# Patient Record
Sex: Female | Born: 1940
Health system: Southern US, Community
[De-identification: ages and names within clinical notes are randomized; demographics above are authoritative.]

## PROBLEM LIST (undated history)

## (undated) DIAGNOSIS — J189 Pneumonia, unspecified organism: Secondary | ICD-10-CM

## (undated) DIAGNOSIS — F319 Bipolar disorder, unspecified: Secondary | ICD-10-CM

## (undated) DIAGNOSIS — I10 Essential (primary) hypertension: Secondary | ICD-10-CM

## (undated) DIAGNOSIS — Z8739 Personal history of other diseases of the musculoskeletal system and connective tissue: Secondary | ICD-10-CM

## (undated) DIAGNOSIS — D573 Sickle-cell trait: Secondary | ICD-10-CM

## (undated) DIAGNOSIS — J45909 Unspecified asthma, uncomplicated: Secondary | ICD-10-CM

## (undated) DIAGNOSIS — T8859XA Other complications of anesthesia, initial encounter: Secondary | ICD-10-CM

## (undated) DIAGNOSIS — E039 Hypothyroidism, unspecified: Secondary | ICD-10-CM

## (undated) DIAGNOSIS — Z8719 Personal history of other diseases of the digestive system: Secondary | ICD-10-CM

## (undated) DIAGNOSIS — M797 Fibromyalgia: Secondary | ICD-10-CM

## (undated) DIAGNOSIS — I2510 Atherosclerotic heart disease of native coronary artery without angina pectoris: Secondary | ICD-10-CM

## (undated) DIAGNOSIS — G629 Polyneuropathy, unspecified: Secondary | ICD-10-CM

## (undated) DIAGNOSIS — E559 Vitamin D deficiency, unspecified: Secondary | ICD-10-CM

## (undated) DIAGNOSIS — M1990 Unspecified osteoarthritis, unspecified site: Secondary | ICD-10-CM

## (undated) DIAGNOSIS — S91339A Puncture wound without foreign body, unspecified foot, initial encounter: Secondary | ICD-10-CM

## (undated) DIAGNOSIS — F419 Anxiety disorder, unspecified: Secondary | ICD-10-CM

## (undated) DIAGNOSIS — I509 Heart failure, unspecified: Secondary | ICD-10-CM

## (undated) DIAGNOSIS — K219 Gastro-esophageal reflux disease without esophagitis: Secondary | ICD-10-CM

## (undated) DIAGNOSIS — I700 Atherosclerosis of aorta: Secondary | ICD-10-CM

## (undated) DIAGNOSIS — M329 Systemic lupus erythematosus, unspecified: Secondary | ICD-10-CM

## (undated) DIAGNOSIS — Z9981 Dependence on supplemental oxygen: Secondary | ICD-10-CM

## (undated) DIAGNOSIS — Z531 Procedure and treatment not carried out because of patient's decision for reasons of belief and group pressure: Secondary | ICD-10-CM

## (undated) DIAGNOSIS — N19 Unspecified kidney failure: Secondary | ICD-10-CM

## (undated) DIAGNOSIS — R569 Unspecified convulsions: Secondary | ICD-10-CM

## (undated) DIAGNOSIS — F32A Depression, unspecified: Secondary | ICD-10-CM

## (undated) DIAGNOSIS — IMO0001 Reserved for inherently not codable concepts without codable children: Secondary | ICD-10-CM

## (undated) DIAGNOSIS — R413 Other amnesia: Secondary | ICD-10-CM

## (undated) DIAGNOSIS — E119 Type 2 diabetes mellitus without complications: Secondary | ICD-10-CM

## (undated) DIAGNOSIS — R410 Disorientation, unspecified: Secondary | ICD-10-CM

## (undated) DIAGNOSIS — T4145XA Adverse effect of unspecified anesthetic, initial encounter: Secondary | ICD-10-CM

## (undated) DIAGNOSIS — L930 Discoid lupus erythematosus: Secondary | ICD-10-CM

## (undated) DIAGNOSIS — E785 Hyperlipidemia, unspecified: Secondary | ICD-10-CM

## (undated) DIAGNOSIS — F329 Major depressive disorder, single episode, unspecified: Secondary | ICD-10-CM

## (undated) DIAGNOSIS — G4733 Obstructive sleep apnea (adult) (pediatric): Secondary | ICD-10-CM

## (undated) HISTORY — PX: INCISION AND DRAINAGE ABSCESS: SHX5864

## (undated) HISTORY — DX: Obstructive sleep apnea (adult) (pediatric): G47.33

## (undated) HISTORY — PX: CARPAL TUNNEL RELEASE: SHX101

## (undated) HISTORY — PX: CHOLECYSTECTOMY OPEN: SUR202

## (undated) HISTORY — DX: Puncture wound without foreign body, unspecified foot, initial encounter: S91.339A

## (undated) HISTORY — PX: CATARACT EXTRACTION W/ INTRAOCULAR LENS  IMPLANT, BILATERAL: SHX1307

## (undated) HISTORY — DX: Unspecified convulsions: R56.9

## (undated) HISTORY — DX: Hyperlipidemia, unspecified: E78.5

## (undated) HISTORY — DX: Heart failure, unspecified: I50.9

## (undated) HISTORY — DX: Major depressive disorder, single episode, unspecified: F32.9

## (undated) HISTORY — PX: FRACTURE SURGERY: SHX138

## (undated) HISTORY — PX: TUBAL LIGATION: SHX77

## (undated) HISTORY — DX: Polyneuropathy, unspecified: G62.9

## (undated) HISTORY — DX: Depression, unspecified: F32.A

## (undated) HISTORY — DX: Unspecified kidney failure: N19

## (undated) HISTORY — DX: Vitamin D deficiency, unspecified: E55.9

## (undated) HISTORY — DX: Essential (primary) hypertension: I10

## (undated) HISTORY — DX: Other amnesia: R41.3

## (undated) HISTORY — PX: NASAL SINUS SURGERY: SHX719

## (undated) HISTORY — DX: Hypothyroidism, unspecified: E03.9

## (undated) HISTORY — PX: KNEE LIGAMENT RECONSTRUCTION: SHX1895

---

## 1998-02-12 ENCOUNTER — Ambulatory Visit (HOSPITAL_COMMUNITY): Admission: RE | Admit: 1998-02-12 | Discharge: 1998-02-12 | Payer: Self-pay | Admitting: Internal Medicine

## 1998-09-09 ENCOUNTER — Emergency Department (HOSPITAL_COMMUNITY): Admission: EM | Admit: 1998-09-09 | Discharge: 1998-09-09 | Payer: Self-pay | Admitting: Emergency Medicine

## 1998-09-09 ENCOUNTER — Encounter: Payer: Self-pay | Admitting: Emergency Medicine

## 1998-09-19 ENCOUNTER — Encounter: Admission: RE | Admit: 1998-09-19 | Discharge: 1998-10-21 | Payer: Self-pay | Admitting: Orthopedic Surgery

## 1998-12-08 ENCOUNTER — Emergency Department (HOSPITAL_COMMUNITY): Admission: EM | Admit: 1998-12-08 | Discharge: 1998-12-08 | Payer: Self-pay | Admitting: Emergency Medicine

## 1998-12-08 ENCOUNTER — Encounter: Payer: Self-pay | Admitting: Emergency Medicine

## 1999-02-10 ENCOUNTER — Ambulatory Visit (HOSPITAL_COMMUNITY): Admission: RE | Admit: 1999-02-10 | Discharge: 1999-02-10 | Payer: Self-pay | Admitting: Internal Medicine

## 1999-03-06 ENCOUNTER — Ambulatory Visit (HOSPITAL_COMMUNITY): Admission: RE | Admit: 1999-03-06 | Discharge: 1999-03-06 | Payer: Self-pay | Admitting: General Surgery

## 1999-03-06 ENCOUNTER — Encounter (HOSPITAL_BASED_OUTPATIENT_CLINIC_OR_DEPARTMENT_OTHER): Payer: Self-pay | Admitting: General Surgery

## 1999-03-16 ENCOUNTER — Encounter (HOSPITAL_BASED_OUTPATIENT_CLINIC_OR_DEPARTMENT_OTHER): Payer: Self-pay | Admitting: General Surgery

## 1999-03-16 ENCOUNTER — Ambulatory Visit (HOSPITAL_COMMUNITY): Admission: RE | Admit: 1999-03-16 | Discharge: 1999-03-16 | Payer: Self-pay | Admitting: General Surgery

## 1999-03-31 ENCOUNTER — Ambulatory Visit (HOSPITAL_COMMUNITY): Admission: RE | Admit: 1999-03-31 | Discharge: 1999-03-31 | Payer: Self-pay | Admitting: General Surgery

## 1999-03-31 ENCOUNTER — Encounter (HOSPITAL_BASED_OUTPATIENT_CLINIC_OR_DEPARTMENT_OTHER): Payer: Self-pay | Admitting: General Surgery

## 1999-03-31 ENCOUNTER — Encounter (INDEPENDENT_AMBULATORY_CARE_PROVIDER_SITE_OTHER): Payer: Self-pay

## 1999-06-18 ENCOUNTER — Encounter: Payer: Self-pay | Admitting: Emergency Medicine

## 1999-06-18 ENCOUNTER — Emergency Department (HOSPITAL_COMMUNITY): Admission: EM | Admit: 1999-06-18 | Discharge: 1999-06-18 | Payer: Self-pay | Admitting: Emergency Medicine

## 1999-08-03 ENCOUNTER — Encounter: Payer: Self-pay | Admitting: Internal Medicine

## 1999-08-03 ENCOUNTER — Encounter: Admission: RE | Admit: 1999-08-03 | Discharge: 1999-08-03 | Payer: Self-pay | Admitting: Internal Medicine

## 1999-09-28 ENCOUNTER — Encounter: Payer: Self-pay | Admitting: Internal Medicine

## 1999-09-28 ENCOUNTER — Encounter: Admission: RE | Admit: 1999-09-28 | Discharge: 1999-09-28 | Payer: Self-pay | Admitting: Internal Medicine

## 1999-11-19 ENCOUNTER — Ambulatory Visit: Admission: RE | Admit: 1999-11-19 | Discharge: 1999-11-19 | Payer: Self-pay | Admitting: Internal Medicine

## 1999-12-18 ENCOUNTER — Encounter: Payer: Self-pay | Admitting: Internal Medicine

## 1999-12-18 ENCOUNTER — Encounter: Admission: RE | Admit: 1999-12-18 | Discharge: 1999-12-18 | Payer: Self-pay | Admitting: Internal Medicine

## 2000-08-18 ENCOUNTER — Encounter: Admission: RE | Admit: 2000-08-18 | Discharge: 2000-08-18 | Payer: Self-pay | Admitting: Family Medicine

## 2000-08-18 ENCOUNTER — Encounter: Payer: Self-pay | Admitting: Family Medicine

## 2001-06-25 ENCOUNTER — Encounter: Payer: Self-pay | Admitting: Emergency Medicine

## 2001-06-25 ENCOUNTER — Emergency Department (HOSPITAL_COMMUNITY): Admission: EM | Admit: 2001-06-25 | Discharge: 2001-06-25 | Payer: Self-pay | Admitting: Emergency Medicine

## 2001-07-17 ENCOUNTER — Encounter: Payer: Self-pay | Admitting: Emergency Medicine

## 2001-07-18 ENCOUNTER — Inpatient Hospital Stay (HOSPITAL_COMMUNITY): Admission: EM | Admit: 2001-07-18 | Discharge: 2001-07-20 | Payer: Self-pay | Admitting: Emergency Medicine

## 2001-07-18 ENCOUNTER — Encounter: Payer: Self-pay | Admitting: Sports Medicine

## 2001-07-25 ENCOUNTER — Encounter: Admission: RE | Admit: 2001-07-25 | Discharge: 2001-07-25 | Payer: Self-pay | Admitting: Family Medicine

## 2001-09-25 ENCOUNTER — Encounter: Payer: Self-pay | Admitting: Gastroenterology

## 2001-09-25 ENCOUNTER — Ambulatory Visit (HOSPITAL_COMMUNITY): Admission: RE | Admit: 2001-09-25 | Discharge: 2001-09-25 | Payer: Self-pay | Admitting: Gastroenterology

## 2001-09-29 ENCOUNTER — Ambulatory Visit (HOSPITAL_COMMUNITY): Admission: RE | Admit: 2001-09-29 | Discharge: 2001-09-29 | Payer: Self-pay | Admitting: Gastroenterology

## 2001-10-12 ENCOUNTER — Ambulatory Visit (HOSPITAL_COMMUNITY): Admission: RE | Admit: 2001-10-12 | Discharge: 2001-10-12 | Payer: Self-pay | Admitting: General Surgery

## 2001-10-12 ENCOUNTER — Encounter (HOSPITAL_BASED_OUTPATIENT_CLINIC_OR_DEPARTMENT_OTHER): Payer: Self-pay | Admitting: General Surgery

## 2001-10-25 ENCOUNTER — Encounter: Payer: Self-pay | Admitting: Family Medicine

## 2001-10-25 ENCOUNTER — Encounter: Admission: RE | Admit: 2001-10-25 | Discharge: 2001-10-25 | Payer: Self-pay | Admitting: Family Medicine

## 2002-10-12 ENCOUNTER — Ambulatory Visit (HOSPITAL_COMMUNITY): Admission: RE | Admit: 2002-10-12 | Discharge: 2002-10-12 | Payer: Self-pay | Admitting: General Surgery

## 2002-10-15 ENCOUNTER — Ambulatory Visit (HOSPITAL_COMMUNITY): Admission: RE | Admit: 2002-10-15 | Discharge: 2002-10-15 | Payer: Self-pay | Admitting: General Surgery

## 2002-10-15 ENCOUNTER — Encounter (HOSPITAL_BASED_OUTPATIENT_CLINIC_OR_DEPARTMENT_OTHER): Payer: Self-pay | Admitting: General Surgery

## 2003-02-19 ENCOUNTER — Encounter: Payer: Self-pay | Admitting: Family Medicine

## 2003-02-19 ENCOUNTER — Encounter: Admission: RE | Admit: 2003-02-19 | Discharge: 2003-02-19 | Payer: Self-pay | Admitting: Family Medicine

## 2003-03-07 ENCOUNTER — Encounter: Admission: RE | Admit: 2003-03-07 | Discharge: 2003-04-08 | Payer: Self-pay | Admitting: Family Medicine

## 2003-05-12 ENCOUNTER — Encounter
Admission: RE | Admit: 2003-05-12 | Discharge: 2003-05-12 | Payer: Self-pay | Admitting: Physical Medicine and Rehabilitation

## 2003-08-02 ENCOUNTER — Ambulatory Visit (HOSPITAL_COMMUNITY): Admission: RE | Admit: 2003-08-02 | Discharge: 2003-08-02 | Payer: Self-pay | Admitting: Family Medicine

## 2003-08-05 ENCOUNTER — Encounter: Admission: RE | Admit: 2003-08-05 | Discharge: 2003-08-05 | Payer: Self-pay | Admitting: Family Medicine

## 2003-10-03 ENCOUNTER — Ambulatory Visit (HOSPITAL_COMMUNITY): Admission: RE | Admit: 2003-10-03 | Discharge: 2003-10-03 | Payer: Self-pay | Admitting: Gastroenterology

## 2003-11-19 ENCOUNTER — Inpatient Hospital Stay (HOSPITAL_COMMUNITY): Admission: EM | Admit: 2003-11-19 | Discharge: 2003-11-23 | Payer: Self-pay | Admitting: *Deleted

## 2004-03-18 ENCOUNTER — Emergency Department (HOSPITAL_COMMUNITY): Admission: EM | Admit: 2004-03-18 | Discharge: 2004-03-18 | Payer: Self-pay | Admitting: Emergency Medicine

## 2004-03-23 ENCOUNTER — Emergency Department (HOSPITAL_COMMUNITY): Admission: EM | Admit: 2004-03-23 | Discharge: 2004-03-23 | Payer: Self-pay | Admitting: *Deleted

## 2004-04-21 ENCOUNTER — Encounter: Admission: RE | Admit: 2004-04-21 | Discharge: 2004-05-07 | Payer: Self-pay | Admitting: Family Medicine

## 2004-05-08 ENCOUNTER — Emergency Department (HOSPITAL_COMMUNITY): Admission: EM | Admit: 2004-05-08 | Discharge: 2004-05-08 | Payer: Self-pay | Admitting: Emergency Medicine

## 2004-05-20 ENCOUNTER — Encounter: Admission: RE | Admit: 2004-05-20 | Discharge: 2004-05-20 | Payer: Self-pay | Admitting: Interventional Cardiology

## 2004-06-24 ENCOUNTER — Encounter: Admission: RE | Admit: 2004-06-24 | Discharge: 2004-06-24 | Payer: Self-pay | Admitting: Pulmonary Disease

## 2004-06-24 ENCOUNTER — Ambulatory Visit: Payer: Self-pay | Admitting: Pulmonary Disease

## 2004-07-14 ENCOUNTER — Ambulatory Visit (HOSPITAL_BASED_OUTPATIENT_CLINIC_OR_DEPARTMENT_OTHER): Admission: RE | Admit: 2004-07-14 | Discharge: 2004-07-14 | Payer: Self-pay | Admitting: Pulmonary Disease

## 2004-07-14 ENCOUNTER — Ambulatory Visit: Payer: Self-pay | Admitting: Pulmonary Disease

## 2004-07-31 ENCOUNTER — Ambulatory Visit: Payer: Self-pay | Admitting: Pulmonary Disease

## 2004-09-09 ENCOUNTER — Ambulatory Visit: Payer: Self-pay | Admitting: Pulmonary Disease

## 2004-10-28 ENCOUNTER — Encounter: Admission: RE | Admit: 2004-10-28 | Discharge: 2005-01-26 | Payer: Self-pay | Admitting: Family Medicine

## 2004-11-11 ENCOUNTER — Encounter
Admission: RE | Admit: 2004-11-11 | Discharge: 2004-11-11 | Payer: Self-pay | Admitting: Physical Medicine and Rehabilitation

## 2004-12-01 ENCOUNTER — Ambulatory Visit: Payer: Self-pay | Admitting: Gastroenterology

## 2004-12-09 ENCOUNTER — Other Ambulatory Visit: Admission: RE | Admit: 2004-12-09 | Discharge: 2004-12-09 | Payer: Self-pay | Admitting: Obstetrics and Gynecology

## 2005-01-15 ENCOUNTER — Encounter: Admission: RE | Admit: 2005-01-15 | Discharge: 2005-01-15 | Payer: Self-pay | Admitting: Obstetrics and Gynecology

## 2005-02-16 ENCOUNTER — Ambulatory Visit (HOSPITAL_COMMUNITY): Admission: RE | Admit: 2005-02-16 | Discharge: 2005-02-16 | Payer: Self-pay | Admitting: Endocrinology

## 2005-03-01 ENCOUNTER — Encounter (INDEPENDENT_AMBULATORY_CARE_PROVIDER_SITE_OTHER): Payer: Self-pay | Admitting: *Deleted

## 2005-03-01 ENCOUNTER — Other Ambulatory Visit: Admission: RE | Admit: 2005-03-01 | Discharge: 2005-03-01 | Payer: Self-pay | Admitting: Interventional Radiology

## 2005-03-01 ENCOUNTER — Encounter: Admission: RE | Admit: 2005-03-01 | Discharge: 2005-03-01 | Payer: Self-pay | Admitting: Endocrinology

## 2005-07-20 ENCOUNTER — Encounter: Admission: RE | Admit: 2005-07-20 | Discharge: 2005-07-20 | Payer: Self-pay | Admitting: Orthopedic Surgery

## 2005-07-22 ENCOUNTER — Ambulatory Visit (HOSPITAL_BASED_OUTPATIENT_CLINIC_OR_DEPARTMENT_OTHER): Admission: RE | Admit: 2005-07-22 | Discharge: 2005-07-22 | Payer: Self-pay | Admitting: Orthopedic Surgery

## 2005-08-17 ENCOUNTER — Inpatient Hospital Stay (HOSPITAL_COMMUNITY): Admission: EM | Admit: 2005-08-17 | Discharge: 2005-08-23 | Payer: Self-pay | Admitting: Emergency Medicine

## 2005-08-20 ENCOUNTER — Encounter (INDEPENDENT_AMBULATORY_CARE_PROVIDER_SITE_OTHER): Payer: Self-pay | Admitting: Cardiology

## 2005-09-16 ENCOUNTER — Ambulatory Visit: Payer: Self-pay | Admitting: Family Medicine

## 2005-10-07 ENCOUNTER — Ambulatory Visit: Payer: Self-pay | Admitting: Family Medicine

## 2005-10-21 ENCOUNTER — Ambulatory Visit: Payer: Self-pay | Admitting: Family Medicine

## 2005-12-23 ENCOUNTER — Ambulatory Visit: Payer: Self-pay | Admitting: Family Medicine

## 2005-12-31 ENCOUNTER — Ambulatory Visit: Payer: Self-pay | Admitting: Family Medicine

## 2006-01-10 ENCOUNTER — Emergency Department (HOSPITAL_COMMUNITY): Admission: EM | Admit: 2006-01-10 | Discharge: 2006-01-10 | Payer: Self-pay | Admitting: Emergency Medicine

## 2006-01-14 ENCOUNTER — Ambulatory Visit: Payer: Self-pay | Admitting: Family Medicine

## 2006-01-21 ENCOUNTER — Encounter: Admission: RE | Admit: 2006-01-21 | Discharge: 2006-01-21 | Payer: Self-pay | Admitting: Family Medicine

## 2006-02-04 ENCOUNTER — Ambulatory Visit: Payer: Self-pay | Admitting: Family Medicine

## 2006-02-22 ENCOUNTER — Ambulatory Visit (HOSPITAL_BASED_OUTPATIENT_CLINIC_OR_DEPARTMENT_OTHER): Admission: RE | Admit: 2006-02-22 | Discharge: 2006-02-22 | Payer: Self-pay | Admitting: Family Medicine

## 2006-02-26 ENCOUNTER — Ambulatory Visit: Payer: Self-pay | Admitting: Internal Medicine

## 2006-03-09 ENCOUNTER — Ambulatory Visit: Payer: Self-pay | Admitting: Family Medicine

## 2006-06-23 ENCOUNTER — Ambulatory Visit: Payer: Self-pay | Admitting: Family Medicine

## 2006-07-19 ENCOUNTER — Encounter: Admission: RE | Admit: 2006-07-19 | Discharge: 2006-08-15 | Payer: Self-pay | Admitting: Orthopaedic Surgery

## 2006-08-16 ENCOUNTER — Encounter: Admission: RE | Admit: 2006-08-16 | Discharge: 2006-11-14 | Payer: Self-pay | Admitting: Orthopaedic Surgery

## 2006-08-29 ENCOUNTER — Ambulatory Visit: Payer: Self-pay | Admitting: Family Medicine

## 2006-10-31 ENCOUNTER — Ambulatory Visit: Payer: Self-pay | Admitting: Family Medicine

## 2006-11-23 ENCOUNTER — Ambulatory Visit: Payer: Self-pay | Admitting: Family Medicine

## 2006-11-30 ENCOUNTER — Ambulatory Visit: Payer: Self-pay | Admitting: Family Medicine

## 2007-01-23 ENCOUNTER — Ambulatory Visit: Payer: Self-pay | Admitting: Family Medicine

## 2007-01-25 ENCOUNTER — Ambulatory Visit: Payer: Self-pay | Admitting: Family Medicine

## 2007-01-25 LAB — CONVERTED CEMR LAB
ALT: 11 units/L (ref 0–35)
AST: 16 units/L (ref 0–37)
Albumin: 3.8 g/dL (ref 3.5–5.2)
Alkaline Phosphatase: 117 units/L (ref 39–117)
BUN: 16 mg/dL (ref 6–23)
CO2: 23 meq/L (ref 19–32)
Calcium: 9.1 mg/dL (ref 8.4–10.5)
Chloride: 106 meq/L (ref 96–112)
Cholesterol: 112 mg/dL (ref 0–200)
Creatinine, Ser: 1.1 mg/dL (ref 0.40–1.20)
Glucose, Bld: 96 mg/dL (ref 70–99)
HDL: 40 mg/dL (ref >39)
LDL Cholesterol: 55 mg/dL (ref 0–99)
Potassium: 4.3 meq/L (ref 3.5–5.3)
Sodium: 140 meq/L (ref 135–145)
TSH: 0.909 microintl units/mL (ref 0.350–5.50)
Total Bilirubin: 0.4 mg/dL (ref 0.3–1.2)
Total CHOL/HDL Ratio: 2.8
Total Protein: 8.3 g/dL (ref 6.0–8.3)
Triglycerides: 86 mg/dL (ref <150)
VLDL: 17 mg/dL (ref 0–40)

## 2007-03-07 DIAGNOSIS — G4730 Sleep apnea, unspecified: Secondary | ICD-10-CM | POA: Insufficient documentation

## 2007-03-07 DIAGNOSIS — I2510 Atherosclerotic heart disease of native coronary artery without angina pectoris: Secondary | ICD-10-CM | POA: Insufficient documentation

## 2007-03-07 DIAGNOSIS — F32A Depression, unspecified: Secondary | ICD-10-CM | POA: Insufficient documentation

## 2007-03-07 DIAGNOSIS — F329 Major depressive disorder, single episode, unspecified: Secondary | ICD-10-CM

## 2007-03-07 DIAGNOSIS — E7800 Pure hypercholesterolemia, unspecified: Secondary | ICD-10-CM | POA: Insufficient documentation

## 2007-03-07 DIAGNOSIS — K219 Gastro-esophageal reflux disease without esophagitis: Secondary | ICD-10-CM | POA: Insufficient documentation

## 2007-03-07 DIAGNOSIS — E785 Hyperlipidemia, unspecified: Secondary | ICD-10-CM

## 2007-03-07 DIAGNOSIS — E119 Type 2 diabetes mellitus without complications: Secondary | ICD-10-CM | POA: Insufficient documentation

## 2007-03-07 DIAGNOSIS — I110 Hypertensive heart disease with heart failure: Secondary | ICD-10-CM | POA: Insufficient documentation

## 2007-03-07 DIAGNOSIS — E039 Hypothyroidism, unspecified: Secondary | ICD-10-CM | POA: Insufficient documentation

## 2007-03-11 DIAGNOSIS — M159 Polyosteoarthritis, unspecified: Secondary | ICD-10-CM | POA: Insufficient documentation

## 2007-03-11 DIAGNOSIS — Z862 Personal history of diseases of the blood and blood-forming organs and certain disorders involving the immune mechanism: Secondary | ICD-10-CM | POA: Insufficient documentation

## 2007-03-11 DIAGNOSIS — G2581 Restless legs syndrome: Secondary | ICD-10-CM | POA: Insufficient documentation

## 2007-03-11 DIAGNOSIS — D649 Anemia, unspecified: Secondary | ICD-10-CM | POA: Insufficient documentation

## 2007-03-11 DIAGNOSIS — Z8639 Personal history of other endocrine, nutritional and metabolic disease: Secondary | ICD-10-CM | POA: Insufficient documentation

## 2007-03-11 DIAGNOSIS — M329 Systemic lupus erythematosus, unspecified: Secondary | ICD-10-CM | POA: Insufficient documentation

## 2007-03-11 DIAGNOSIS — G3184 Mild cognitive impairment, so stated: Secondary | ICD-10-CM | POA: Insufficient documentation

## 2007-04-13 ENCOUNTER — Encounter (INDEPENDENT_AMBULATORY_CARE_PROVIDER_SITE_OTHER): Payer: Self-pay | Admitting: Nurse Practitioner

## 2007-05-09 ENCOUNTER — Encounter: Admission: RE | Admit: 2007-05-09 | Discharge: 2007-05-09 | Payer: Self-pay | Admitting: Family Medicine

## 2007-08-17 ENCOUNTER — Encounter (INDEPENDENT_AMBULATORY_CARE_PROVIDER_SITE_OTHER): Payer: Self-pay | Admitting: Internal Medicine

## 2007-12-08 ENCOUNTER — Telehealth (INDEPENDENT_AMBULATORY_CARE_PROVIDER_SITE_OTHER): Payer: Self-pay | Admitting: Family Medicine

## 2008-12-20 ENCOUNTER — Encounter: Payer: Self-pay | Admitting: Family Medicine

## 2009-01-15 ENCOUNTER — Ambulatory Visit: Payer: Self-pay | Admitting: Family Medicine

## 2009-01-15 LAB — CONVERTED CEMR LAB: Hgb A1c MFr Bld: 6.6 %

## 2009-01-16 LAB — CONVERTED CEMR LAB
ALT: 15 units/L (ref 0–35)
AST: 21 units/L (ref 0–37)
Albumin: 3.8 g/dL (ref 3.5–5.2)
Alkaline Phosphatase: 122 units/L — ABNORMAL HIGH (ref 39–117)
BUN: 18 mg/dL (ref 6–23)
CO2: 21 meq/L (ref 19–32)
Calcium: 8.6 mg/dL (ref 8.4–10.5)
Chloride: 107 meq/L (ref 96–112)
Creatinine, Ser: 1.28 mg/dL — ABNORMAL HIGH (ref 0.40–1.20)
Direct LDL: 60 mg/dL
Glucose, Bld: 118 mg/dL — ABNORMAL HIGH (ref 70–99)
HCT: 35.3 % — ABNORMAL LOW (ref 36.0–46.0)
Hemoglobin: 12.2 g/dL (ref 12.0–15.0)
MCHC: 34.6 g/dL (ref 30.0–36.0)
MCV: 69.6 fL — ABNORMAL LOW (ref 78.0–100.0)
Platelets: 236 10*3/uL (ref 150–400)
Potassium: 4.7 meq/L (ref 3.5–5.3)
RBC: 5.07 M/uL (ref 3.87–5.11)
RDW: 16.1 % — ABNORMAL HIGH (ref 11.5–15.5)
Sodium: 142 meq/L (ref 135–145)
TSH: 0.804 microintl units/mL (ref 0.350–4.500)
Total Bilirubin: 0.3 mg/dL (ref 0.3–1.2)
Total Protein: 8.3 g/dL (ref 6.0–8.3)
WBC: 9 10*3/uL (ref 4.0–10.5)

## 2009-01-20 ENCOUNTER — Telehealth: Payer: Self-pay | Admitting: Family Medicine

## 2009-01-28 ENCOUNTER — Encounter: Payer: Self-pay | Admitting: Family Medicine

## 2009-02-26 ENCOUNTER — Ambulatory Visit: Payer: Self-pay | Admitting: Family Medicine

## 2009-02-26 LAB — CONVERTED CEMR LAB
Bilirubin Urine: NEGATIVE
Blood in Urine, dipstick: NEGATIVE
Glucose, Urine, Semiquant: NEGATIVE
Ketones, urine, test strip: NEGATIVE
Nitrite: NEGATIVE
Protein, U semiquant: 100
Specific Gravity, Urine: >=1.03
Urobilinogen, UA: 0.2
WBC Urine, dipstick: NEGATIVE
pH: 5.5

## 2009-03-18 ENCOUNTER — Telehealth: Payer: Self-pay | Admitting: Family Medicine

## 2009-04-25 ENCOUNTER — Ambulatory Visit: Payer: Self-pay | Admitting: Family Medicine

## 2009-04-25 DIAGNOSIS — M1990 Unspecified osteoarthritis, unspecified site: Secondary | ICD-10-CM | POA: Insufficient documentation

## 2009-04-25 DIAGNOSIS — E1149 Type 2 diabetes mellitus with other diabetic neurological complication: Secondary | ICD-10-CM | POA: Insufficient documentation

## 2009-04-25 LAB — CONVERTED CEMR LAB: Hgb A1c MFr Bld: 6.4 %

## 2009-05-06 ENCOUNTER — Telehealth: Payer: Self-pay | Admitting: Family Medicine

## 2009-05-06 ENCOUNTER — Encounter: Payer: Self-pay | Admitting: Family Medicine

## 2009-05-07 ENCOUNTER — Encounter: Admission: RE | Admit: 2009-05-07 | Discharge: 2009-05-07 | Payer: Self-pay | Admitting: Family Medicine

## 2009-06-09 ENCOUNTER — Telehealth: Payer: Self-pay | Admitting: Family Medicine

## 2009-07-25 ENCOUNTER — Ambulatory Visit: Payer: Self-pay | Admitting: Family Medicine

## 2009-07-25 LAB — CONVERTED CEMR LAB: Hgb A1c MFr Bld: 6.7 %

## 2009-09-17 ENCOUNTER — Ambulatory Visit: Payer: Self-pay | Admitting: Family Medicine

## 2009-09-23 ENCOUNTER — Telehealth: Payer: Self-pay | Admitting: Family Medicine

## 2009-09-23 ENCOUNTER — Telehealth (INDEPENDENT_AMBULATORY_CARE_PROVIDER_SITE_OTHER): Payer: Self-pay | Admitting: Family Medicine

## 2009-10-02 ENCOUNTER — Telehealth: Payer: Self-pay | Admitting: Family Medicine

## 2009-10-03 ENCOUNTER — Encounter: Payer: Self-pay | Admitting: Family Medicine

## 2009-10-03 ENCOUNTER — Ambulatory Visit: Payer: Self-pay | Admitting: Family Medicine

## 2009-10-03 DIAGNOSIS — R259 Unspecified abnormal involuntary movements: Secondary | ICD-10-CM | POA: Insufficient documentation

## 2009-10-03 LAB — CONVERTED CEMR LAB
ALT: 14 units/L (ref 0–35)
AST: 20 units/L (ref 0–37)
Albumin: 3.9 g/dL (ref 3.5–5.2)
Alkaline Phosphatase: 113 units/L (ref 39–117)
BUN: 19 mg/dL (ref 6–23)
CO2: 26 meq/L (ref 19–32)
Calcium: 8.8 mg/dL (ref 8.4–10.5)
Chloride: 102 meq/L (ref 96–112)
Cholesterol: 117 mg/dL (ref 0–200)
Creatinine, Ser: 1.64 mg/dL — ABNORMAL HIGH (ref 0.40–1.20)
Glucose, Bld: 91 mg/dL (ref 70–99)
HCT: 36.5 % (ref 36.0–46.0)
HDL: 36 mg/dL — ABNORMAL LOW (ref >39)
Hemoglobin: 12 g/dL (ref 12.0–15.0)
Hgb A1c MFr Bld: 6.8 %
LDL Cholesterol: 69 mg/dL (ref 0–99)
MCHC: 32.9 g/dL (ref 30.0–36.0)
MCV: 71.9 fL — ABNORMAL LOW (ref 78.0–100.0)
Platelets: 256 10*3/uL (ref 150–400)
Potassium: 4.9 meq/L (ref 3.5–5.3)
RBC: 5.08 M/uL (ref 3.87–5.11)
RDW: 15.2 % (ref 11.5–15.5)
Sodium: 137 meq/L (ref 135–145)
TSH: 0.947 microintl units/mL (ref 0.350–4.500)
Total Bilirubin: 0.4 mg/dL (ref 0.3–1.2)
Total CHOL/HDL Ratio: 3.3
Total Protein: 8.3 g/dL (ref 6.0–8.3)
Triglycerides: 58 mg/dL (ref <150)
VLDL: 12 mg/dL (ref 0–40)
WBC: 12.2 10*3/uL — ABNORMAL HIGH (ref 4.0–10.5)

## 2009-10-06 ENCOUNTER — Encounter: Payer: Self-pay | Admitting: Family Medicine

## 2009-10-17 ENCOUNTER — Ambulatory Visit: Payer: Self-pay | Admitting: Family Medicine

## 2009-10-20 LAB — CONVERTED CEMR LAB: Ferritin: 132 ng/mL (ref 10–291)

## 2009-11-06 ENCOUNTER — Telehealth: Payer: Self-pay | Admitting: Family Medicine

## 2009-12-25 ENCOUNTER — Telehealth: Payer: Self-pay | Admitting: Family Medicine

## 2010-01-12 ENCOUNTER — Telehealth: Payer: Self-pay | Admitting: Family Medicine

## 2010-01-12 ENCOUNTER — Encounter: Payer: Self-pay | Admitting: *Deleted

## 2010-02-27 ENCOUNTER — Ambulatory Visit: Payer: Self-pay | Admitting: Family Medicine

## 2010-02-27 DIAGNOSIS — R2681 Unsteadiness on feet: Secondary | ICD-10-CM | POA: Insufficient documentation

## 2010-03-26 ENCOUNTER — Encounter: Payer: Self-pay | Admitting: Family Medicine

## 2010-03-26 DIAGNOSIS — J449 Chronic obstructive pulmonary disease, unspecified: Secondary | ICD-10-CM | POA: Insufficient documentation

## 2010-03-31 ENCOUNTER — Encounter: Payer: Self-pay | Admitting: Family Medicine

## 2010-03-31 DIAGNOSIS — I5042 Chronic combined systolic (congestive) and diastolic (congestive) heart failure: Secondary | ICD-10-CM

## 2010-03-31 DIAGNOSIS — I5022 Chronic systolic (congestive) heart failure: Secondary | ICD-10-CM | POA: Insufficient documentation

## 2010-04-27 ENCOUNTER — Encounter: Payer: Self-pay | Admitting: Family Medicine

## 2010-05-08 ENCOUNTER — Ambulatory Visit: Payer: Self-pay | Admitting: Family Medicine

## 2010-05-08 DIAGNOSIS — R21 Rash and other nonspecific skin eruption: Secondary | ICD-10-CM | POA: Insufficient documentation

## 2010-05-08 LAB — CONVERTED CEMR LAB: Hgb A1c MFr Bld: 7.4 %

## 2010-05-13 ENCOUNTER — Telehealth (INDEPENDENT_AMBULATORY_CARE_PROVIDER_SITE_OTHER): Payer: Self-pay | Admitting: Family Medicine

## 2010-05-13 DIAGNOSIS — N183 Chronic kidney disease, stage 3 unspecified: Secondary | ICD-10-CM | POA: Insufficient documentation

## 2010-05-20 ENCOUNTER — Telehealth: Payer: Self-pay | Admitting: *Deleted

## 2010-06-17 ENCOUNTER — Encounter
Admission: RE | Admit: 2010-06-17 | Discharge: 2010-06-17 | Payer: Self-pay | Source: Home / Self Care | Attending: Family Medicine | Admitting: Family Medicine

## 2010-07-25 ENCOUNTER — Encounter: Payer: Self-pay | Admitting: Gastroenterology

## 2010-07-26 ENCOUNTER — Encounter: Payer: Self-pay | Admitting: Endocrinology

## 2010-07-26 ENCOUNTER — Encounter: Payer: Self-pay | Admitting: Physical Medicine and Rehabilitation

## 2010-08-06 NOTE — Miscellaneous (Signed)
Summary: Asthma clarifcation   Clinical Lists Changes  Problems: Removed problem of HEEL PAIN (ICD-729.5) Removed problem of RELIGION AFFECTING CARE (ICD-V62.6) Changed problem from ASTHMA (ICD-493.90) to ASTHMA, PERSISTENT, MODERATE (ICD-493.90)

## 2010-08-06 NOTE — Progress Notes (Signed)
   Phone Note Call from Patient   Caller: Patient Call For: [phone] Summary of Call: Ms. Reidinger is requesting change of walker.  She want the type that has a seat and a basket.  Need physician to send new rx Initial call taken by: Eusebio Friendly,  May 20, 2010 2:35 PM  Follow-up for Phone Call        Handwritten Rx given to nurse to follow up from there Follow-up by: Madison Hickman MD,  May 21, 2010 1:46 PM  Additional Follow-up for Phone Call Additional follow up Details #1::        wheelchair request faxed to [facility] Additional Follow-up by: Audelia Hives CMA,  May 21, 2010 6:03 PM

## 2010-08-06 NOTE — Miscellaneous (Signed)
Summary: Old record information  Received old records from Dr. Laurance Flatten.  Incorporated key facts from those records. Brittany Hickman MD  January 28, 2009 1:51 PM  Clinical Lists Changes  Allergies: Added new allergy or adverse reaction of LIPITOR (ATORVASTATIN) Observations: Added new observation of ALLERGY REV: Done (01/28/2009 13:49) Added new observation of MAMMOGRAM: No specific mammographic evidence of malignancy.  Assessment: BIRADS 1.  (05/09/2007 14:18) Added new observation of EKG INTERP: normal:   (07/09/2005 14:19) Added new observation of CXR RESULTS: normal:   (07/09/2005 14:19) Added new observation of COLONOSCOPY: Results: Normal.  (07/05/2002 14:17)      Prevention & Chronic Care Immunizations   Influenza vaccine: Not documented    Tetanus booster: 08/05/2005: Historical    Pneumococcal vaccine: Historical  (08/05/2000)    H. zoster vaccine: Not documented  Colorectal Screening   Hemoccult: Not documented    Colonoscopy: Results: Normal.   (07/05/2002)  Other Screening   Pap smear: Not documented    Mammogram: No specific mammographic evidence of malignancy.  Assessment: BIRADS 1.   (05/09/2007)    DXA bone density scan: Not documented   Smoking status: Not documented  Diabetes Mellitus   HgbA1C: 6.6  (01/15/2009)    Eye exam: Not documented    Foot exam: Not documented   High risk foot: Not documented   Foot care education: Not documented    Urine microalbumin/creatinine ratio: Not documented  Lipids   Total Cholesterol: 112  (01/25/2007)   LDL: 55  (01/25/2007)   LDL Direct: 60  (01/15/2009)   HDL: 40  (01/25/2007)   Triglycerides: 86  (01/25/2007)    SGOT (AST): 21  (01/15/2009)   SGPT (ALT): 15  (01/15/2009)   Alkaline phosphatase: 122  (01/15/2009)   Total bilirubin: 0.3  (01/15/2009)  Hypertension   Last Blood Pressure: 135 / 71  (01/15/2009)   Serum creatinine: 1.28  (01/15/2009)   Serum potassium 4.7  (01/15/2009)   Self-Management Support :    Diabetes self-management support: Not documented    Hypertension self-management support: Not documented    Lipid self-management support: Not documented     Allergies: 1)  ! Pcn 2)  ! * Requip 3)  ! * Lyrica 4)  ! Lipitor (Atorvastatin)       Colonoscopy  Procedure date:  07/05/2002  Findings:      Results: Normal.   Mammogram  Procedure date:  05/09/2007  Findings:      No specific mammographic evidence of malignancy.  Assessment: BIRADS 1.   CXR  Procedure date:  07/09/2005  Findings:      normal:    EKG  Procedure date:  07/09/2005  Findings:      normal:     Colonoscopy  Procedure date:  07/05/2002  Findings:      Results: Normal.   Mammogram  Procedure date:  05/09/2007  Findings:      No specific mammographic evidence of malignancy.  Assessment: BIRADS 1.   CXR  Procedure date:  07/09/2005  Findings:      normal:    EKG  Procedure date:  07/09/2005  Findings:      normal:      Appended Document: Old record information     Clinical Lists Changes  Observations: Added new observation of PAPRECACT: Not indicated-other (01/28/2009 14:20)       Prevention & Chronic Care Immunizations   Influenza vaccine: Not documented    Tetanus booster: 08/05/2005: Historical    Pneumococcal vaccine: Historical  (  08/05/2000)    H. zoster vaccine: Not documented  Colorectal Screening   Hemoccult: Not documented    Colonoscopy: Results: Normal.   (07/05/2002)  Other Screening   Pap smear: Not documented   Pap smear action/deferral: Not indicated-other  (01/28/2009)    Mammogram: No specific mammographic evidence of malignancy.  Assessment: BIRADS 1.   (05/09/2007)    DXA bone density scan: Not documented   Smoking status: Not documented  Diabetes Mellitus   HgbA1C: 6.6  (01/15/2009)    Eye exam: Not documented    Foot exam: Not documented   High risk foot: Not  documented   Foot care education: Not documented    Urine microalbumin/creatinine ratio: Not documented  Lipids   Total Cholesterol: 112  (01/25/2007)   LDL: 55  (01/25/2007)   LDL Direct: 60  (01/15/2009)   HDL: 40  (01/25/2007)   Triglycerides: 86  (01/25/2007)    SGOT (AST): 21  (01/15/2009)   SGPT (ALT): 15  (01/15/2009)   Alkaline phosphatase: 122  (01/15/2009)   Total bilirubin: 0.3  (01/15/2009)  Hypertension   Last Blood Pressure: 135 / 71  (01/15/2009)   Serum creatinine: 1.28  (01/15/2009)   Serum potassium 4.7  (01/15/2009)  Self-Management Support :    Diabetes self-management support: Not documented    Hypertension self-management support: Not documented    Lipid self-management support: Not documented

## 2010-08-06 NOTE — Medication Information (Signed)
Summary: RX Folder/DISCONTINUE REQUESTS  RX Folder/DISCONTINUE REQUESTS   Imported By: Roland Earl 08/25/2007 11:07:03  _____________________________________________________________________  External Attachment:    Type:   Image     Comment:   External Document

## 2010-08-06 NOTE — Assessment & Plan Note (Signed)
Summary: new pt/AC   Vital Signs:  Patient profile:   70 year old female Height:      63 inches Weight:      285.7 pounds BMI:     50.79 Temp:     98.7 degrees F Pulse rate:   86 / minute BP sitting:   135 / 71  (left arm)  Vitals Entered By: Janeth Rase LPN (July 14, 624THL 075-GRM AM) CC: New patient. Is Patient Diabetic? Yes  Pain Assessment Patient in pain? no        CC:  New patient..  History of Present Illness: First visit to me.  Needs refills on one med.  Brought in all meds.  Unclear where she stands with health promotion disease prevention strategies  Has some buttocks soreness on sitting.    Also has some nasal congestion which is controled by her meds.  I note on her problem list that mild cognitive impairment is listed and this fits with my impression of her during the interview  Current Medications (verified): 1)  Metoclopramide Hcl 10 Mg  Tabs (Metoclopramide Hcl) .... Take 1 Tablet By Mouth Three Times A Day 2)  Nexium 40 Mg  Cpdr (Esomeprazole Magnesium) .... Take 1 Tablet By Mouth Once A Day 3)  Synthroid 75 Mcg  Tabs (Levothyroxine Sodium) .... Take 1 Tablet By Mouth Once A Day 4)  Fexofenadine Hcl 180 Mg  Tabs (Fexofenadine Hcl) .... Take 1 Tablet By Mouth Once A Day 5)  Imdur 60 Mg  Tb24 (Isosorbide Mononitrate) .... Take 1 Tablet By Mouth Once A Day 6)  Lexapro 20 Mg  Tabs (Escitalopram Oxalate) .... Take 1 Tablet By Mouth Once A Day 7)  Aspirin Ec 325 Mg  Tbec (Aspirin) .... Take 1 Tablet By Mouth Once A Day 8)  Coreg 3.125 Mg  Tabs (Carvedilol) .... Take 1 Tablet By Mouth Two Times A Day 9)  Norvasc 10 Mg  Tabs (Amlodipine Besylate) .... Take 1 Tablet By Mouth Every Morning 10)  Aldactone 25 Mg  Tabs (Spironolactone) .... Take 1 Tablet By Mouth Once A Day 11)  Klonopin 1 Mg  Tabs (Clonazepam) .... Take 1 Tab By Mouth At Bedtime 12)  Tramadol Hcl 50 Mg  Tabs (Tramadol Hcl) .... Take 1 Tablet Every 8 Hrs or When Needed 13)   Hydrocodone-Acetaminophen 5-500 Mg  Tabs (Hydrocodone-Acetaminophen) .Marland Kitchen.. 1-2 Tablets By Mouth Q 6 Hours As Needed Shoulder Pain(Per Dr.blackman) 14)  Zocor 40 Mg  Tabs (Simvastatin) .... Take 1 Tablet By Mouth Once A Day 15)  Lidoderm 5 %  Ptch (Lidocaine) .Marland Kitchen.. 1-3 Every 12 Hours 16)  Albuterol Mdi .... Two Puffs Every 4-6 Hours or When Needed 17)  Humulin N 100 Unit/ml  Susp (Insulin Isophane Human) .... 30 Units Subcutaneously Q Am 18)  Lac-Hydrin 12 %  Lotn (Ammonium Lactate) .... Apply Every Morning To Affected Area 19)  Gabapentin 300 Mg  Caps (Gabapentin) .... One By Mouth Three Times A Day  Allergies (verified): 1)  ! Pcn 2)  ! * Requip 3)  ! * Lyrica  Family History: Reviewed history from 12/20/2008 and no changes required. Family History of CAD Female 1st degree relative- mother Family History Diabetes 1st degree relative- mother  Social History: Reviewed history from 12/20/2008 and no changes required. Lives alone.  She is retired.  Quit smoking cigarettes, does not use alcohol or illicit drugs.    Physical Exam  General:  Well-developed,obese,in no acute distress; alert,appropriate and cooperative throughout examination  Mouth:  Oral mucosa and oropharynx without lesions or exudates.  Teeth in good repair. Neck:  No deformities, masses, or tenderness noted. Lungs:  Normal respiratory effort, chest expands symmetrically. Lungs are clear to auscultation, no crackles or wheezes. Heart:  2/6 SEM Abdomen:  Bowel sounds positive,abdomen soft and non-tender without masses, organomegaly or hernias noted. Extremities:  No clubbing, cyanosis, edema, or deformity noted with normal full range of motion of all joints.     Impression & Recommendations:  Problem # 1:  DIABETES MELLITUS, TYPE II ([id number]) Uncertain control, check A1C Her updated medication list for this problem includes:    Aspirin Ec 325 Mg Tbec (Aspirin) .Marland Kitchen... Take 1 tablet by mouth once a day    Humulin N 100  Unit/ml Susp (Insulin isophane human) .Marland KitchenMarland KitchenMarland KitchenMarland Kitchen 30 units subcutaneously q am  Problem # 2:  HYPERTENSION (ICD-401.9) Good control by today's BP Her updated medication list for this problem includes:    Coreg 3.125 Mg Tabs (Carvedilol) .Marland Kitchen... Take 1 tablet by mouth two times a day    Norvasc 10 Mg Tabs (Amlodipine besylate) .Marland Kitchen... Take 1 tablet by mouth every morning    Aldactone 25 Mg Tabs (Spironolactone) .Marland Kitchen... Take 1 tablet by mouth once a day  Problem # 3:  CONGESTIVE HEART FAILURE (ICD-428.0) Seems to have good control Her updated medication list for this problem includes:    Aspirin Ec 325 Mg Tbec (Aspirin) .Marland Kitchen... Take 1 tablet by mouth once a day    Coreg 3.125 Mg Tabs (Carvedilol) .Marland Kitchen... Take 1 tablet by mouth two times a day    Aldactone 25 Mg Tabs (Spironolactone) .Marland Kitchen... Take 1 tablet by mouth once a day  Orders: Comp Met-[facility] [id number]) CBC-[facility] [id number])  Problem # 4:  HYPOTHYROIDISM (ICD-244.9)  Her updated medication list for this problem includes:    Synthroid 75 Mcg Tabs (Levothyroxine sodium) .Marland Kitchen... Take 1 tablet by mouth once a day  Problem # 5:  RENAL FAILURE (ICD-586)  Complete Medication List: 1)  Metoclopramide Hcl 10 Mg Tabs (Metoclopramide hcl) .... Take 1 tablet by mouth three times a day 2)  Nexium 40 Mg Cpdr (Esomeprazole magnesium) .... Take 1 tablet by mouth once a day 3)  Synthroid 75 Mcg Tabs (Levothyroxine sodium) .... Take 1 tablet by mouth once a day 4)  Fexofenadine Hcl 180 Mg Tabs (Fexofenadine hcl) .... Take 1 tablet by mouth once a day 5)  Imdur 60 Mg Tb24 (Isosorbide mononitrate) .... Take 1 tablet by mouth once a day 6)  Lexapro 20 Mg Tabs (Escitalopram oxalate) .... Take 1 tablet by mouth once a day 7)  Aspirin Ec 325 Mg Tbec (Aspirin) .... Take 1 tablet by mouth once a day 8)  Coreg 3.125 Mg Tabs (Carvedilol) .... Take 1 tablet by mouth two times a day 9)  Norvasc 10 Mg Tabs (Amlodipine besylate) .... Take 1 tablet by mouth every morning 10)   Aldactone 25 Mg Tabs (Spironolactone) .... Take 1 tablet by mouth once a day 11)  Klonopin 1 Mg Tabs (Clonazepam) .... Take 1 tab by mouth at bedtime 12)  Tramadol Hcl 50 Mg Tabs (Tramadol hcl) .... Take 1 tablet every 8 hrs or when needed 13)  Hydrocodone-acetaminophen 5-500 Mg Tabs (Hydrocodone-acetaminophen) .Marland Kitchen.. 1-2 tablets by mouth q 6 hours as needed shoulder pain(per dr.blackman) 14)  Zocor 40 Mg Tabs (Simvastatin) .... Take 1 tablet by mouth once a day 15)  Lidoderm 5 % Ptch (Lidocaine) .Marland Kitchen.. 1-3 every 12 hours 16)  Albuterol Mdi  .Marland KitchenMarland KitchenMarland Kitchen  Two puffs every 4-6 hours or when needed 17)  Humulin N 100 Unit/ml Susp (Insulin isophane human) .... 30 units subcutaneously q am 18)  Lac-hydrin 12 % Lotn (Ammonium lactate) .... Apply every morning to affected area 19)  Gabapentin 300 Mg Caps (Gabapentin) .... One by mouth three times a day  Other Orders: Direct LDL-[facility] [id number]) A1C-[facility] [id number]) TSH-[facility] [id number])  Patient Instructions: 1)  Because you seem to be doing well, no medicine changes this visit.  I will get your old records and see you in one month.  We will see what changes and tests need to be done then. Prescriptions: GABAPENTIN 300 MG  CAPS (GABAPENTIN) one by mouth three times a day  #90 x 12   Entered and Authorized by:   Madison Hickman MD   Signed by:   Madison Hickman MD on 01/15/2009   Method used:   Electronically to        [facility] [id number]* (retail)       [address]       [place], [place]  [address]       Ph: [phone] or [phone]       Fax: [phone]   RxID:   [id number]   Laboratory Results   Blood Tests   Date/Time Received: January 15, 2009 11:01 AM  Date/Time Reported: January 15, 2009 11:25 AM   HGBA1C: 6.6%   (Normal Range: Non-Diabetic - 3-6%   Control Diabetic - 6-8%)  Comments: ...........test performed by...........Marland KitchenHedy Camara, CMA    release of information faxed to Dr. Redge Gainer of  [facility]. Marcell Barlow RN  January 15, 2009 11:53 AM

## 2010-08-06 NOTE — Progress Notes (Signed)
Summary: triage   Phone Note Call from Patient   Caller: Patient Summary of Call: Pt has dry throat. Initial call taken by: Raymond Gurney,  October 02, 2009 11:07 AM  Follow-up for Phone Call        left message Follow-up by: Elige Radon RN,  October 02, 2009 11:25 AM  Additional Follow-up for Phone Call Additional follow up Details #1::        sore throat  x 3 days. feels dry. no fever. states she cannot come in tomorrow. states she cannot take tessalon pearles since she could not void when taking them. she took lasix & voided. accepted appt tomorrow at 1:45 with S.Saxon. she was very specific with time she could come in. Additional Follow-up by: Elige Radon RN,  October 02, 2009 3:37 PM    Additional Follow-up for Phone Call Additional follow up Details #2::    noted and agree. Follow-up by: Madison Hickman MD,  October 03, 2009 9:32 AM

## 2010-08-06 NOTE — Miscellaneous (Signed)
Summary: New pt info     Family History: Family History of CAD Female 1st degree relative- mother Family History Diabetes 1st degree relative- mother  Social History: Lives alone.  She is retired.  Quit smoking cigarettes, does not use alcohol or illicit drugs.  Ethnicity:  Black Occupation:  Retired Market researcher:  Education administrator Residence:  [facility] Exposure-Excessive:  no Therapist, art Use:  yes Sex Orientation:  Heterosexual HIV Risk:  no risk noted Hepatitis Risk:  no risk noted STD Risk:  no risk noted   Pt did not write medications on new pt form.  Asked her to bring all medication bottles to first visit with Dr. [name]. Arnette Schaumann RN  December 20, 2008 5:07 PM

## 2010-08-06 NOTE — Progress Notes (Signed)
Summary: Rx Prob  Medications Added AMLODIPINE BESYLATE 10 MG TABS (AMLODIPINE BESYLATE) one by mouth daily       Phone Note Call from Patient Call back at Home Phone [phone]   Caller: Patient Summary of Call: pt needs to go back on norvacs becuase the other medication he put her on was causing her legs to hurt.   Initial call taken by: Raymond Gurney,  March 18, 2009 10:01 AM  Follow-up for Phone Call        patient reports after starting lisinopril her legs from her  foot up above knee felt tired , weak and maybe a burning sensation. she took med for a week and then stopped and started back on the norvasc. she had 3 tabs left over . states legs feel better but still not as they were before she started lisinopril. will send  message to MD. Follow-up by: Marcell Barlow RN,  March 18, 2009 11:02 AM  Additional Follow-up for Phone Call Additional follow up Details #1::        Longstanding neuropathy - seemed to get worse with lisinopril.  For now, hold lisinopril and restart norvasc.  Will see me later this month Additional Follow-up by: Madison Hickman MD,  March 18, 2009 11:21 AM    New/Updated Medications: AMLODIPINE BESYLATE 10 MG TABS (AMLODIPINE BESYLATE) one by mouth daily Prescriptions: AMLODIPINE BESYLATE 10 MG TABS (AMLODIPINE BESYLATE) one by mouth daily  #30 x 12   Entered and Authorized by:   Madison Hickman MD   Signed by:   Madison Hickman MD on 03/18/2009   Method used:   Electronically to        [facility]. [id number]* (retail)       [address]       [place], [place]  [address]       Ph: [phone] or [phone]       Fax: [phone]   RxID:   [id number]

## 2010-08-06 NOTE — Progress Notes (Signed)
Summary: needs note   Phone Note Call from Patient Call back at Home Phone [phone]   Caller: Patient Summary of Call: wants to talk to Dr [name] b/c she is supposed to go to jury duty on Nov 14th and wants a note to get out of it - she is not physically able to do this. Initial call taken by: Audie Clear,  May 06, 2009 12:29 PM  Follow-up for Phone Call        Letter written and place up front to be picked up by patient. Follow-up by: Madison Hickman MD,  May 06, 2009 2:59 PM  Additional Follow-up for Phone Call Additional follow up Details #1::        pt notified. she asked that it be mailed to her home. done Additional Follow-up by: Elige Radon RN,  May 06, 2009 3:02 PM

## 2010-08-06 NOTE — Miscellaneous (Signed)
Summary: formulary change  Medications Added PROAIR HFA 108 (90 BASE) MCG/ACT AERS (ALBUTEROL SULFATE) 2 puffs q4h as needed shortness of breath       Clinical Lists Changes completed fax request for formulary change Medications: Changed medication from PROVENTIL HFA 108 (90 BASE) MCG/ACT AERS (ALBUTEROL SULFATE) Two puffs every four hours as needed wheezing to PROAIR HFA 108 (90 BASE) MCG/ACT AERS (ALBUTEROL SULFATE) 2 puffs q4h as needed shortness of breath - Signed Rx of PROAIR HFA 108 (90 BASE) MCG/ACT AERS (ALBUTEROL SULFATE) 2 puffs q4h as needed shortness of breath;  #1 x 12;  Signed;  Entered by: Madison Hickman MD;  Authorized by: Madison Hickman MD;  Method used: Handwritten    Prescriptions: PROAIR HFA 108 (90 BASE) MCG/ACT AERS (ALBUTEROL SULFATE) 2 puffs q4h as needed shortness of breath  #1 x 12   Entered and Authorized by:   Madison Hickman MD   Signed by:   Madison Hickman MD on 07/25/2009   Method used:   Handwritten   RxID:   [id number]

## 2010-08-06 NOTE — Progress Notes (Signed)
Summary: phn msg  Medications Added NORTRIPTYLINE HCL 25 MG CAPS (NORTRIPTYLINE HCL) one by mouth daily x 1 week then 1 by mouth two times a day x 1 week then one by mouth three times a day thereafter       Phone Note Call from Patient Call back at Home Phone [phone]   Caller: Patient Summary of Call: pt would like to talk to Dr. [name] about her pain.  The pain medicine she was given is not working. Initial call taken by: Raymond Gurney,  June 09, 2009 1:52 PM  Follow-up for Phone Call        Talked with Pt states that the Hydrocodone and Gabapentin is not working "not worth the money" [employer] on [address].  Would like something that does workk.  Told Pt I would send message to you to see if you would give her another Rx or if you wanted to see her before changing her medications. .. Follow-up by: Isabelle Course,  June 09, 2009 4:07 PM  Additional Follow-up for Phone Call Additional follow up Details #1::        added nortryptaline.  Called and discussed.  FU 1 month. Additional Follow-up by: Madison Hickman MD,  June 11, 2009 10:19 AM    New/Updated Medications: NORTRIPTYLINE HCL 25 MG CAPS (NORTRIPTYLINE HCL) one by mouth daily x 1 week then 1 by mouth two times a day x 1 week then one by mouth three times a day thereafter Prescriptions: NORTRIPTYLINE HCL 25 MG CAPS (NORTRIPTYLINE HCL) one by mouth daily x 1 week then 1 by mouth two times a day x 1 week then one by mouth three times a day thereafter  #90 x 12   Entered and Authorized by:   Madison Hickman MD   Signed by:   Madison Hickman MD on 06/10/2009   Method used:   Electronically to        [facility] (retail)       [place], [place]  [id number]       Ph: [phone]       Fax: [phone]   RxID:   [id number]

## 2010-08-06 NOTE — Progress Notes (Signed)
Summary: MD called Pharmacy?Pt NOT seen at [facility]   Phone Note Outgoing Call   Call placed by: Milagros Evener MD,  December 08, 2007 10:48 AM Summary of Call: MD called Mortimer Fries at [facility] [place]. Pt not [facility]. Initial call taken by: Milagros Evener MD,  December 08, 2007 10:52 AM

## 2010-08-06 NOTE — Assessment & Plan Note (Signed)
Summary: f/u,df   Vital Signs:  Patient profile:   70 year old female Weight:      292.4 pounds Temp:     98.1 degrees F oral Pulse rate:   68 / minute BP sitting:   148 / 88  History of Present Illness: Rt. knee is giving out on her.  Has had previous arthroscopic surg about 10 years ago.    "I need a shot in my left knee."  CO pain.   Last seen by ortho about 6 months ago and told arthritis in both knees.  Did not bring in meds so med rec difficult.  She does not think she is taking her lisinopril (she thought I told her to stop.)  DM good control with A1C at 6.7 today.     Current Medications (verified): 1)  Metoclopramide Hcl 10 Mg  Tabs (Metoclopramide Hcl) .... Take 1 Tablet By Mouth Four Times A Day 2)  Nexium 40 Mg  Cpdr (Esomeprazole Magnesium) .... Take 1 Tablet By Mouth Once A Day 3)  Synthroid 75 Mcg  Tabs (Levothyroxine Sodium) .... Take 1 Tablet By Mouth Once A Day 4)  Fexofenadine Hcl 180 Mg  Tabs (Fexofenadine Hcl) .... Take 1 Tablet By Mouth Once A Day 5)  Imdur 60 Mg  Tb24 (Isosorbide Mononitrate) .... Take 1 Tablet By Mouth Once A Day 6)  Lexapro 20 Mg  Tabs (Escitalopram Oxalate) .... Take 1 Tablet By Mouth Once A Day 7)  Aspirin Ec 325 Mg  Tbec (Aspirin) .... Take 1 Tablet By Mouth Once A Day 8)  Coreg 3.125 Mg  Tabs (Carvedilol) .... Take 1 Tablet By Mouth Two Times A Day 9)  Lisinopril 10 Mg  Tabs (Lisinopril) .... Take 1 Tab By Mouth Daily 10)  Aldactone 25 Mg  Tabs (Spironolactone) .... Take 1 Tablet By Mouth Once A Day 11)  Klonopin 1 Mg  Tabs (Clonazepam) .... Take 1 Tab By Mouth At Bedtime 12)  Hydrocodone-Acetaminophen 5-500 Mg  Tabs (Hydrocodone-Acetaminophen) .Marland Kitchen.. 1 Tablets By Mouth Q 6 Hours As Needed Severe Pain 13)  Zocor 40 Mg  Tabs (Simvastatin) .... Take 1 Tablet By Mouth Once A Day 14)  Lidoderm 5 %  Ptch (Lidocaine) .Marland Kitchen.. 1-3 Every 12 Hours 15)  Proair Hfa 108 (90 Base) Mcg/act Aers (Albuterol Sulfate) .... 2 Puffs Q4h As Needed Shortness of  Breath 16)  Humulin N 100 Unit/ml  Susp (Insulin Isophane Human) .... 30 Units Subcutaneously Q Am 17)  Lac-Hydrin 12 %  Lotn (Ammonium Lactate) .... Apply Every Morning To Affected Area 18)  Gabapentin 600 Mg Tabs (Gabapentin) .... One By Mouth Three Times A Day For Neuropathy 19)  Amlodipine Besylate 10 Mg Tabs (Amlodipine Besylate) .... One By Mouth Daily 20)  Flovent Hfa 220 Mcg/act Aero (Fluticasone Propionate  Hfa) .... Two Puffs Daily - Use Every Day Even If You Are Not Wheezing 21)  Nortriptyline Hcl 25 Mg Caps (Nortriptyline Hcl) .... One By Mouth Daily X 1 Week Then 1 By Mouth Two Times A Day X 1 Week Then One By Mouth Three Times A Day Thereafter  Allergies (verified): 1)  ! Pcn 2)  ! * Requip 3)  ! * Lyrica 4)  ! Lipitor (Atorvastatin)  Past History:  Past medical, surgical, family and social histories (including risk factors) reviewed, and no changes noted (except as noted below).  Past Medical History: Reviewed history from 03/07/2007 and no changes required. Current Problems:  COGNITIVE IMPAIRMENT, MILD, SO STATED (ICD-331.83) RESTLESS  LEG SYNDROME (ICD-333.94) CONGESTIVE HEART FAILURE (ICD-428.0) THYROID NODULE, HX OF (ICD-V12.2) OSTEOARTHROSIS, GENERALIZED, MULTIPLE SITES (ICD-715.09) ANEMIA NOS (ICD-285.9) DIABETES MELLITUS, TYPE II (ICD-250.00) RENAL FAILURE (ICD-586) HYPOTHYROIDISM (ICD-244.9) HYPERTENSION (ICD-401.9) GERD (ICD-530.81) DEPRESSION (ICD-311) CORONARY ARTERY DISEASE (ICD-414.00) ASTHMA (ICD-493.90) SLEEP APNEA (ICD-780.57) DYSLIPIDEMIA (ICD-272.4) DM (ICD-250.00)  Past Surgical History: Reviewed history from 03/07/2007 and no changes required. Cholecystectomy Tubal ligation Carpal tunnel release  s/p right knee arthroscopy Renal u/s 08/19/2005  Family History: Reviewed history from 12/20/2008 and no changes required. Family History of CAD Female 1st degree relative- mother Family History Diabetes 1st degree relative- mother  Social  History: Reviewed history from 12/20/2008 and no changes required. Lives alone.  She is retired.  Quit smoking cigarettes, does not use alcohol or illicit drugs.    Physical Exam  General:  Well-developed,well-nourished,in no acute distress; alert,appropriate and cooperative throughout examination  Wt noted and asked her to refocus efforts on wt loss Lungs:  Normal respiratory effort, chest expands symmetrically. Lungs are clear to auscultation, no crackles or wheezes. Heart:  Normal rate and regular rhythm. S1 and S2 normal without gallop, murmur, click, rub or other extra sounds. Extremities:  Crepitus over both knees.  Ligaments appear intact.  Injected both knees with kenalog 40 mg and 3 cc lidocaine.   Impression & Recommendations:  Problem # 1:  HYPERTENSION (ICD-401.9) Assessment Unchanged  Poor control may be due to not taking meds as I have listed.  Will recheck in 2-3 weeks and she is to bring in all meds. Her updated medication list for this problem includes:    Coreg 3.125 Mg Tabs (Carvedilol) .Marland Kitchen... Take 1 tablet by mouth two times a day    Lisinopril 10 Mg Tabs (Lisinopril) .Marland Kitchen... Take 1 tab by mouth daily    Aldactone 25 Mg Tabs (Spironolactone) .Marland Kitchen... Take 1 tablet by mouth once a day    Amlodipine Besylate 10 Mg Tabs (Amlodipine besylate) ..... One by mouth daily  Orders: [place]- Est  Level 4 [id number])  Problem # 2:  DIABETES MELLITUS, TYPE II (ICD-250.00) Assessment: Improved Good control Her updated medication list for this problem includes:    Aspirin Ec 325 Mg Tbec (Aspirin) .Marland Kitchen... Take 1 tablet by mouth once a day    Lisinopril 10 Mg Tabs (Lisinopril) .Marland Kitchen... Take 1 tab by mouth daily    Humulin N 100 Unit/ml Susp (Insulin isophane human) .Marland KitchenMarland KitchenMarland KitchenMarland Kitchen 30 units subcutaneously q am  Orders: [facility] [id number]) [place]- Est  Level 4 [id number])  Problem # 3:  OSTEOARTHRITIS (ICD-715.90)  Both knees.  Recommend cane or walker.  May need to revisit ortho.  Both knees injected. Her  updated medication list for this problem includes:    Aspirin Ec 325 Mg Tbec (Aspirin) .Marland Kitchen... Take 1 tablet by mouth once a day    Hydrocodone-acetaminophen 5-500 Mg Tabs (Hydrocodone-acetaminophen) .Marland Kitchen... 1 tablets by mouth q 6 hours as needed severe pain  Orders: [place]- Est  Level 4 [id number])  Complete Medication List: 1)  Metoclopramide Hcl 10 Mg Tabs (Metoclopramide hcl) .... Take 1 tablet by mouth four times a day 2)  Nexium 40 Mg Cpdr (Esomeprazole magnesium) .... Take 1 tablet by mouth once a day 3)  Synthroid 75 Mcg Tabs (Levothyroxine sodium) .... Take 1 tablet by mouth once a day 4)  Fexofenadine Hcl 180 Mg Tabs (Fexofenadine hcl) .... Take 1 tablet by mouth once a day 5)  Imdur 60 Mg Tb24 (Isosorbide mononitrate) .... Take 1 tablet by mouth once a day 6)  Lexapro 20  Mg Tabs (Escitalopram oxalate) .... Take 1 tablet by mouth once a day 7)  Aspirin Ec 325 Mg Tbec (Aspirin) .... Take 1 tablet by mouth once a day 8)  Coreg 3.125 Mg Tabs (Carvedilol) .... Take 1 tablet by mouth two times a day 9)  Lisinopril 10 Mg Tabs (Lisinopril) .... Take 1 tab by mouth daily 10)  Aldactone 25 Mg Tabs (Spironolactone) .... Take 1 tablet by mouth once a day 11)  Klonopin 1 Mg Tabs (Clonazepam) .... Take 1 tab by mouth at bedtime 12)  Hydrocodone-acetaminophen 5-500 Mg Tabs (Hydrocodone-acetaminophen) .Marland Kitchen.. 1 tablets by mouth q 6 hours as needed severe pain 13)  Zocor 40 Mg Tabs (Simvastatin) .... Take 1 tablet by mouth once a day 14)  Lidoderm 5 % Ptch (Lidocaine) .Marland Kitchen.. 1-3 every 12 hours 15)  Proair Hfa 108 (90 Base) Mcg/act Aers (Albuterol sulfate) .... 2 puffs q4h as needed shortness of breath 16)  Humulin N 100 Unit/ml Susp (Insulin isophane human) .... 30 units subcutaneously q am 17)  Lac-hydrin 12 % Lotn (Ammonium lactate) .... Apply every morning to affected area 18)  Gabapentin 600 Mg Tabs (Gabapentin) .... One by mouth three times a day for neuropathy 19)  Amlodipine Besylate 10 Mg Tabs  (Amlodipine besylate) .... One by mouth daily 20)  Flovent Hfa 220 Mcg/act Aero (Fluticasone propionate  hfa) .... Two puffs daily - use every day even if you are not wheezing 21)  Nortriptyline Hcl 25 Mg Caps (Nortriptyline hcl) .... One by mouth daily x 1 week then 1 by mouth two times a day x 1 week then one by mouth three times a day thereafter  Patient Instructions: 1)  Make an appointment within the next few weeks and bring all your meds.  I need to be clear about what you are and aren't taking.  Laboratory Results   Blood Tests   Date/Time Received: July 25, 2009 2:29 PM  Date/Time Reported: July 25, 2009 2:39 PM   HGBA1C: 6.7%   (Normal Range: Non-Diabetic - 3-6%   Control Diabetic - 6-8%)  Comments: ...........test performed by...........Marland KitchenHedy Camara, CMA      Immunization History:  Influenza Immunization History:    Influenza:  historical (05/07/2009)   Prevention & Chronic Care Immunizations   Influenza vaccine: Historical  (05/07/2009)   Influenza vaccine due: 03/05/2010    Tetanus booster: 08/05/2005: Historical    Pneumococcal vaccine: Pneumovax  (02/26/2009)    H. zoster vaccine: Not documented  Colorectal Screening   Hemoccult: Not documented   Hemoccult action/deferral: Not indicated  (04/25/2009)    Colonoscopy: Results: Normal.   (07/05/2002)   Colonoscopy due: 07/05/2012  Other Screening   Pap smear: Not documented   Pap smear action/deferral: Not indicated-other  (01/28/2009)    Mammogram: ASSESSMENT: Negative - BI-RADS 1^MM DIGITAL SCREENING  (05/07/2009)   Mammogram action/deferral: Ordered  (04/25/2009)   Mammogram due: 05/07/2010    DXA bone density scan: Not documented   Smoking status: never  (02/26/2009)  Diabetes Mellitus   HgbA1C: 6.7  (07/25/2009)   HgbA1C action/deferral: Ordered  (04/25/2009)    Eye exam: Not documented    Foot exam: yes  (04/25/2009)   Foot exam action/deferral: Do today   High risk foot:  Not documented   Foot care education: Done  (04/25/2009)    Urine microalbumin/creatinine ratio: Not documented   Urine microalbumin action/deferral: Not indicated    Diabetes flowsheet reviewed?: Yes   Progress toward A1C goal: At goal  Lipids  Total Cholesterol: 112  (01/25/2007)   Lipid panel action/deferral: Not indicated   LDL: 55  (01/25/2007)   LDL Direct: 60  (01/15/2009)   HDL: 40  (01/25/2007)   Triglycerides: 86  (01/25/2007)    SGOT (AST): 21  (01/15/2009)   SGPT (ALT): 15  (01/15/2009)   Alkaline phosphatase: 122  (01/15/2009)   Total bilirubin: 0.3  (01/15/2009)    Lipid flowsheet reviewed?: Yes   Progress toward LDL goal: At goal  Hypertension   Last Blood Pressure: 148 / 88  (07/25/2009)   Serum creatinine: 1.28  (01/15/2009)   Serum potassium 4.7  (01/15/2009)    Hypertension flowsheet reviewed?: Yes   Progress toward BP goal: Unchanged  Self-Management Support :   Personal Goals (by the next clinic visit) :     Personal A1C goal: 7  (02/26/2009)     Personal blood pressure goal: 130/80  (02/26/2009)     Personal LDL goal: 100  (02/26/2009)    Diabetes self-management support: Written self-care plan  (07/25/2009)   Diabetes care plan printed    Hypertension self-management support: Written self-care plan  (07/25/2009)   Hypertension self-care plan printed.    Lipid self-management support: Written self-care plan  (07/25/2009)   Lipid self-care plan printed.

## 2010-08-06 NOTE — Miscellaneous (Signed)
Summary: ROI  ROI   Imported By: Raymond Gurney 01/15/2009 12:19:11  _____________________________________________________________________  External Attachment:    Type:   Image     Comment:   External Document

## 2010-08-06 NOTE — Assessment & Plan Note (Signed)
Summary: F/U hoverround,df   Vital Signs:  Patient profile:   70 year old female Height:      63 inches Weight:      299 pounds BMI:     53.16 O2 Sat:      92 % on 0.25 L/min Temp:     99.0 degrees F oral Pulse rate:   73 / minute BP sitting:   121 / 76  (right arm) Cuff size:   large  Vitals Entered By: Enid Skeens, CMA (February 27, 2010 2:28 PM)  O2 Flow:  0.25 L/min CC: hoverround request Is Patient Diabetic? Yes Comments coughing and wheezing x2 weeks   CC:  hoverround request.  History of Present Illness: C/O coughing and wheezing for two weeks.  Getting worse.  No fever.  Mild increase in dyspnea on exertion.  Using inhalors.  Wants another Rx for hoverround.  Currently has one that is 71 years old and was told she could get a new one.  She is able to ambulate in her apartment with a walker.  Actually came today ambulating on own without walker.  Able to go from parking lot to exam room with minimal difficulty.    Current Medications (verified): 1)  Metoclopramide Hcl 10 Mg  Tabs (Metoclopramide Hcl) .... Take 1 Tablet By Mouth Four Times A Day 2)  Nexium 40 Mg  Cpdr (Esomeprazole Magnesium) .... Take 1 Tablet By Mouth Once A Day 3)  Synthroid 75 Mcg  Tabs (Levothyroxine Sodium) .... Take 1 Tablet By Mouth Once A Day 4)  Imdur 60 Mg  Tb24 (Isosorbide Mononitrate) .... Take 1 Tablet By Mouth Once A Day 5)  Lexapro 20 Mg  Tabs (Escitalopram Oxalate) .... Take 1 Tablet By Mouth Once A Day 6)  Aspirin Ec 325 Mg  Tbec (Aspirin) .... Take 1 Tablet By Mouth Once A Day 7)  Carvedilol 6.25 Mg Tabs (Carvedilol) .... One By Mouth Two Times A Day 8)  Aldactone 25 Mg  Tabs (Spironolactone) .... Take 1 Tablet By Mouth Once A Day 9)  Klonopin 1 Mg  Tabs (Clonazepam) .... Take 1 Tab By Mouth At Bedtime 10)  Tramadol Hcl 50 Mg  Tabs (Tramadol Hcl) .... One Q8h As Needed Pain 11)  Zocor 40 Mg  Tabs (Simvastatin) .... Take 1 Tablet By Mouth Once A Day 12)  Lidoderm 5 %  Ptch (Lidocaine)  .Marland Kitchen.. 1-3 Every 12 Hours 13)  Proair Hfa 108 (90 Base) Mcg/act Aers (Albuterol Sulfate) .... 2 Puffs Q4h As Needed Shortness of Breath 14)  Humulin N 100 Unit/ml  Susp (Insulin Isophane Human) .... 30 Units Subcutaneously Q Am 15)  Lac-Hydrin 12 %  Lotn (Ammonium Lactate) .... Apply Every Morning To Affected Area 16)  Gabapentin 600 Mg Tabs (Gabapentin) .... One By Mouth Three Times A Day For Neuropathy 17)  Amlodipine Besylate 10 Mg Tabs (Amlodipine Besylate) .... One By Mouth Daily 18)  Flovent Hfa 220 Mcg/act Aero (Fluticasone Propionate  Hfa) .... Two Puffs Daily - Use Every Day Even If You Are Not Wheezing 19)  Azithromycin 250 Mg  Tabs (Azithromycin) .... 2 By  Mouth Today and Then 1 Daily For 4 Days (Antibiotic) 20)  Prednisone 20 Mg Tabs (Prednisone) .... One Tab Twice A Day For 5 Days 21)  Tussionex Pennkinetic Er 8-10 Mg/53ml Lqcr (Chlorpheniramine-Hydrocodone) .... One Teaspoon By Mouth Two Times A Day As Needed Cough: Disp 4 Oz  Allergies (verified): 1)  ! Pcn 2)  ! * Requip 3)  ! *  Lyrica 4)  ! Lipitor (Atorvastatin)  Past History:  Past medical, surgical, family and social histories (including risk factors) reviewed, and no changes noted (except as noted below).  Past Medical History: Reviewed history from 03/07/2007 and no changes required. Current Problems:  COGNITIVE IMPAIRMENT, MILD, SO STATED (ICD-331.83) RESTLESS LEG SYNDROME (ICD-333.94) CONGESTIVE HEART FAILURE (ICD-428.0) THYROID NODULE, HX OF (ICD-V12.2) OSTEOARTHROSIS, GENERALIZED, MULTIPLE SITES (ICD-715.09) ANEMIA NOS (ICD-285.9) DIABETES MELLITUS, TYPE II (ICD-250.00) RENAL FAILURE (ICD-586) HYPOTHYROIDISM (ICD-244.9) HYPERTENSION (ICD-401.9) GERD (ICD-530.81) DEPRESSION (ICD-311) CORONARY ARTERY DISEASE (ICD-414.00) ASTHMA (ICD-493.90) SLEEP APNEA (ICD-780.57) DYSLIPIDEMIA (ICD-272.4) DM (ICD-250.00)  Past Surgical History: Reviewed history from 03/07/2007 and no changes  required. Cholecystectomy Tubal ligation Carpal tunnel release  s/p right knee arthroscopy Renal u/s 08/19/2005  Family History: Reviewed history from 12/20/2008 and no changes required. Family History of CAD Female 1st degree relative- mother Family History Diabetes 1st degree relative- mother  Social History: Reviewed history from 12/20/2008 and no changes required. Lives alone.  She is retired.  Quit smoking cigarettes, does not use alcohol or illicit drugs.    Physical Exam  General:  Well-developed,well-nourished,in no acute distress; alert,appropriate and cooperative throughout examination Lungs:  Bilateral exp wheezes.  Good air movement. Neurologic:  Able to ambulate slowly without difficulty or aid.   Impression & Recommendations:  Problem # 1:  ASTHMA (ICD-493.90)  Presumed infectious exacerbation Her updated medication list for this problem includes:    Proair Hfa 108 (90 Base) Mcg/act Aers (Albuterol sulfate) .Marland Kitchen... 2 puffs q4h as needed shortness of breath    Flovent Hfa 220 Mcg/act Aero (Fluticasone propionate  hfa) .Marland Kitchen..Marland Kitchen Two puffs daily - use every day even if you are not wheezing    Prednisone 20 Mg Tabs (Prednisone) ..... One tab twice a day for 5 days  Orders: [facility] Est  Level 4 [id number])  Problem # 2:  GAIT DISTURBANCE (ICD-781.2)  Difficulty walking is primarily due to her obesity and deconditioning.  Note wt increase.  Told did not qualify for hoverround. RX.  Orders: [place]- Est  Level 4 [id number])  Problem # 3:  OBESITY (ICD-278.00) Assessment: Deteriorated  Worsened - Had frank discussion how important it is for her to lose wt and stay mobile.  She seemed to understand but was unhappy that I would not "give" her a hoverround.  Orders: [place]- Est  Level 4 [id number])  Complete Medication List: 1)  Metoclopramide Hcl 10 Mg Tabs (Metoclopramide hcl) .... Take 1 tablet by mouth four times a day 2)  Nexium 40 Mg Cpdr (Esomeprazole magnesium) .... Take 1 tablet  by mouth once a day 3)  Synthroid 75 Mcg Tabs (Levothyroxine sodium) .... Take 1 tablet by mouth once a day 4)  Imdur 60 Mg Tb24 (Isosorbide mononitrate) .... Take 1 tablet by mouth once a day 5)  Lexapro 20 Mg Tabs (Escitalopram oxalate) .... Take 1 tablet by mouth once a day 6)  Aspirin Ec 325 Mg Tbec (Aspirin) .... Take 1 tablet by mouth once a day 7)  Carvedilol 6.25 Mg Tabs (Carvedilol) .... One by mouth two times a day 8)  Aldactone 25 Mg Tabs (Spironolactone) .... Take 1 tablet by mouth once a day 9)  Klonopin 1 Mg Tabs (Clonazepam) .... Take 1 tab by mouth at bedtime 10)  Tramadol Hcl 50 Mg Tabs (Tramadol hcl) .... One q8h as needed pain 11)  Zocor 40 Mg Tabs (Simvastatin) .... Take 1 tablet by mouth once a day 12)  Lidoderm 5 % Ptch (Lidocaine) .Marland Kitchen.. 1-3 every  12 hours 13)  Proair Hfa 108 (90 Base) Mcg/act Aers (Albuterol sulfate) .... 2 puffs q4h as needed shortness of breath 14)  Humulin N 100 Unit/ml Susp (Insulin isophane human) .... 30 units subcutaneously q am 15)  Lac-hydrin 12 % Lotn (Ammonium lactate) .... Apply every morning to affected area 16)  Gabapentin 600 Mg Tabs (Gabapentin) .... One by mouth three times a day for neuropathy 17)  Amlodipine Besylate 10 Mg Tabs (Amlodipine besylate) .... One by mouth daily 18)  Flovent Hfa 220 Mcg/act Aero (Fluticasone propionate  hfa) .... Two puffs daily - use every day even if you are not wheezing 19)  Azithromycin 250 Mg Tabs (Azithromycin) .... 2 by  mouth today and then 1 daily for 4 days (antibiotic) 20)  Prednisone 20 Mg Tabs (Prednisone) .... One tab twice a day for 5 days 21)  Tussionex Pennkinetic Er 8-10 Mg/25ml Lqcr (Chlorpheniramine-hydrocodone) .... One teaspoon by mouth two times a day as needed cough: disp 4 oz  Patient Instructions: 1)  You have three meds to help your cough and wheezing: 2)  Prednisone calms down wheezing 3)  Azithromicin is an antibiotic 4)  Tussinex is a cough medicine. Prescriptions: TUSSIONEX  PENNKINETIC ER 8-10 MG/5ML LQCR (CHLORPHENIRAMINE-HYDROCODONE) one teaspoon by mouth two times a day as needed cough: Disp 4 oz  #4 x 0   Entered and Authorized by:   Madison Hickman MD   Signed by:   Madison Hickman MD on 02/27/2010   Method used:   Handwritten   RxID:   [id number] PREDNISONE 20 MG TABS (PREDNISONE) One tab twice a day for 5 days  #10 x 0   Entered and Authorized by:   Madison Hickman MD   Signed by:   Madison Hickman MD on 02/27/2010   Method used:   Electronically to        [facility] (retail)       [place], [place]  [id number]       Ph: [phone]       Fax: [phone]   RxID:   [id number] AZITHROMYCIN 250 MG  TABS (AZITHROMYCIN) 2 by  mouth today and then 1 daily for 4 days (antibiotic)  #6 x 0   Entered and Authorized by:   Madison Hickman MD   Signed by:   Madison Hickman MD on 02/27/2010   Method used:   Electronically to        [facility] (retail)       [place], [place]  [id number]       Ph: [phone]       Fax: [phone]   RxID:   [id number]

## 2010-08-06 NOTE — Miscellaneous (Signed)
Summary: testing freq?   Clinical Lists Changes I had called in a refill of the test strips for her prodigy. pharmacy wants to know how often she tests. not found on chart. LM for pt to call back & tell us how often she checks.call [phone] with number.Elige Radon RN  January 12, 2010 3:49 PM  tests once a day per pt. called pharmacy & told them .Marland KitchenElige Radon RN  January 12, 2010 4:41 PM

## 2010-08-06 NOTE — Progress Notes (Signed)
Summary: meds prob   Phone Note Call from Patient Call back at Home Phone [phone]   Caller: Patient Summary of Call: needs to talk to doctor about allergy meds Initial call taken by: Audie Clear,  January 12, 2010 11:34 AM  Follow-up for Phone Call        she did not get the allegra since it is $35 OTC. wants something else that is rx so it will be covered.   uses [facility]. needs strips for her prodigy meter. I called it in. Follow-up by: Elige Radon RN,  January 12, 2010 11:42 AM  Additional Follow-up for Phone Call Additional follow up Details #1::        noted and discussed.  Had copious mucous with cough.  Now improving.  No additional meds for now.  Call back if not improving. Additional Follow-up by: Madison Hickman MD,  January 12, 2010 12:28 PM

## 2010-08-06 NOTE — Assessment & Plan Note (Signed)
Summary: f/u,df   Vital Signs:  Patient profile:   70 year old female Height:      63 inches Weight:      287.1 pounds BMI:     51.04 Temp:     97.7 degrees F oral Pulse rate:   83 / minute BP sitting:   147 / 94  (left arm)  Vitals Entered By: Isabelle Course (September 17, 2009 3:13 PM) CC: F/U Is Patient Diabetic? Yes Did you bring your meter with you today? No Pain Assessment Patient in pain? no        CC:  F/U.  History of Present Illness: Left knee was doing well but in the last few days has gone out on her a couple of times.  Did not fall but was close to falling.  Knee pain is bilateral - wants cortizone injections  Both hands have tremor.  Affects her writing.    For whatever reason, has not been taking lisinopril or nortryptaline.  Encouraged to start.  Habits & Providers  Alcohol-Tobacco-Diet     Tobacco Status: never  Current Medications (verified): 1)  Metoclopramide Hcl 10 Mg  Tabs (Metoclopramide Hcl) .... Take 1 Tablet By Mouth Four Times A Day 2)  Nexium 40 Mg  Cpdr (Esomeprazole Magnesium) .... Take 1 Tablet By Mouth Once A Day 3)  Synthroid 75 Mcg  Tabs (Levothyroxine Sodium) .... Take 1 Tablet By Mouth Once A Day 4)  Fexofenadine Hcl 180 Mg  Tabs (Fexofenadine Hcl) .... Take 1 Tablet By Mouth Once A Day 5)  Imdur 60 Mg  Tb24 (Isosorbide Mononitrate) .... Take 1 Tablet By Mouth Once A Day 6)  Lexapro 20 Mg  Tabs (Escitalopram Oxalate) .... Take 1 Tablet By Mouth Once A Day 7)  Aspirin Ec 325 Mg  Tbec (Aspirin) .... Take 1 Tablet By Mouth Once A Day 8)  Carvedilol 6.25 Mg Tabs (Carvedilol) .... One By Mouth Two Times A Day 9)  Lisinopril 10 Mg  Tabs (Lisinopril) .... Take 1 Tab By Mouth Daily 10)  Aldactone 25 Mg  Tabs (Spironolactone) .... Take 1 Tablet By Mouth Once A Day 11)  Klonopin 1 Mg  Tabs (Clonazepam) .... Take 1 Tab By Mouth At Bedtime 12)  Tramadol Hcl 50 Mg  Tabs (Tramadol Hcl) .... One Q8h As Needed Pain 13)  Zocor 40 Mg  Tabs (Simvastatin)  .... Take 1 Tablet By Mouth Once A Day 14)  Lidoderm 5 %  Ptch (Lidocaine) .Marland Kitchen.. 1-3 Every 12 Hours 15)  Proair Hfa 108 (90 Base) Mcg/act Aers (Albuterol Sulfate) .... 2 Puffs Q4h As Needed Shortness of Breath 16)  Humulin N 100 Unit/ml  Susp (Insulin Isophane Human) .... 30 Units Subcutaneously Q Am 17)  Lac-Hydrin 12 %  Lotn (Ammonium Lactate) .... Apply Every Morning To Affected Area 18)  Gabapentin 600 Mg Tabs (Gabapentin) .... One By Mouth Three Times A Day For Neuropathy 19)  Amlodipine Besylate 10 Mg Tabs (Amlodipine Besylate) .... One By Mouth Daily 20)  Flovent Hfa 220 Mcg/act Aero (Fluticasone Propionate  Hfa) .... Two Puffs Daily - Use Every Day Even If You Are Not Wheezing 21)  Nortriptyline Hcl 25 Mg Caps (Nortriptyline Hcl) .... One By Mouth Daily X 1 Week Then 1 By Mouth Two Times A Day X 1 Week Then One By Mouth Three Times A Day Thereafter  Allergies (verified): 1)  ! Pcn 2)  ! * Requip 3)  ! * Lyrica 4)  ! Lipitor (Atorvastatin)  Past  History:  Past medical, surgical, family and social histories (including risk factors) reviewed, and no changes noted (except as noted below).  Past Medical History: Reviewed history from 03/07/2007 and no changes required. Current Problems:  COGNITIVE IMPAIRMENT, MILD, SO STATED (ICD-331.83) RESTLESS LEG SYNDROME (ICD-333.94) CONGESTIVE HEART FAILURE (ICD-428.0) THYROID NODULE, HX OF (ICD-V12.2) OSTEOARTHROSIS, GENERALIZED, MULTIPLE SITES (ICD-715.09) ANEMIA NOS (ICD-285.9) DIABETES MELLITUS, TYPE II (ICD-250.00) RENAL FAILURE (ICD-586) HYPOTHYROIDISM (ICD-244.9) HYPERTENSION (ICD-401.9) GERD (ICD-530.81) DEPRESSION (ICD-311) CORONARY ARTERY DISEASE (ICD-414.00) ASTHMA (ICD-493.90) SLEEP APNEA (ICD-780.57) DYSLIPIDEMIA (ICD-272.4) DM (ICD-250.00)  Past Surgical History: Reviewed history from 03/07/2007 and no changes required. Cholecystectomy Tubal ligation Carpal tunnel release  s/p right knee arthroscopy Renal u/s  08/19/2005  Family History: Reviewed history from 12/20/2008 and no changes required. Family History of CAD Female 1st degree relative- mother Family History Diabetes 1st degree relative- mother  Social History: Reviewed history from 12/20/2008 and no changes required. Lives alone.  She is retired.  Quit smoking cigarettes, does not use alcohol or illicit drugs.    Physical Exam  General:  Well-developed,well-nourished,in no acute distress; alert,appropriate and cooperative throughout examination Lungs:  Normal respiratory effort, chest expands symmetrically. Lungs are clear to auscultation, no crackles or wheezes. Heart:  Normal rate and regular rhythm. S1 and S2 normal without gallop, murmur, click, rub or other extra sounds. Extremities:  Bilateral painful knees.  Ligaments intact.  Both seperately injected with Kenalog 40 and xylocaine.  Patient tolerated well Left clinic with improved pain.  Diabetes Management Exam:    Eye Exam:       Eye Exam done elsewhere          Date: 07/05/2009          Results: normal   Impression & Recommendations:  Problem # 1:  OSTEOARTHRITIS (ICD-715.90) Assessment Deteriorated  Bilateral knee injections Her updated medication list for this problem includes:    Aspirin Ec 325 Mg Tbec (Aspirin) .Marland Kitchen... Take 1 tablet by mouth once a day    Tramadol Hcl 50 Mg Tabs (Tramadol hcl) ..... One q8h as needed pain  Orders: [place]- Est  Level 4 [id number])  Problem # 2:  HYPERTENSION (ICD-401.9) BP poor control  has not been taking lisinopril.  START.  Also increase carvedilol.  Repeat labs ordered one month. Her updated medication list for this problem includes:    Carvedilol 6.25 Mg Tabs (Carvedilol) ..... One by mouth two times a day    Lisinopril 10 Mg Tabs (Lisinopril) .Marland Kitchen... Take 1 tab by mouth daily    Aldactone 25 Mg Tabs (Spironolactone) .Marland Kitchen... Take 1 tablet by mouth once a day    Amlodipine Besylate 10 Mg Tabs (Amlodipine besylate) ..... One by mouth  daily  Orders: [place]- Est  Level 4 [id number])  Problem # 3:  DIABETES MELLITUS, TYPE II (ICD-250.00) labs ordered one month. Her updated medication list for this problem includes:    Aspirin Ec 325 Mg Tbec (Aspirin) .Marland Kitchen... Take 1 tablet by mouth once a day    Lisinopril 10 Mg Tabs (Lisinopril) .Marland Kitchen... Take 1 tab by mouth daily    Humulin N 100 Unit/ml Susp (Insulin isophane human) .Marland KitchenMarland KitchenMarland KitchenMarland Kitchen 30 units subcutaneously q am  Orders: [place]- Est  Level 4 ([id number])Future Orders: A1C-[facility] [id number]) ... 09/03/2010  Problem # 4:  DYSLIPIDEMIA (ICD-272.4) fasting labs ordered for one month. Her updated medication list for this problem includes:    Zocor 40 Mg Tabs (Simvastatin) .Marland Kitchen... Take 1 tablet by mouth once a day  Future Orders: Comp Met-[facility] [id number]) .Marland KitchenMarland Kitchen  09/03/2010 Lipid-[facility] [id number]) ... 09/03/2010  Complete Medication List: 1)  Metoclopramide Hcl 10 Mg Tabs (Metoclopramide hcl) .... Take 1 tablet by mouth four times a day 2)  Nexium 40 Mg Cpdr (Esomeprazole magnesium) .... Take 1 tablet by mouth once a day 3)  Synthroid 75 Mcg Tabs (Levothyroxine sodium) .... Take 1 tablet by mouth once a day 4)  Fexofenadine Hcl 180 Mg Tabs (Fexofenadine hcl) .... Take 1 tablet by mouth once a day 5)  Imdur 60 Mg Tb24 (Isosorbide mononitrate) .... Take 1 tablet by mouth once a day 6)  Lexapro 20 Mg Tabs (Escitalopram oxalate) .... Take 1 tablet by mouth once a day 7)  Aspirin Ec 325 Mg Tbec (Aspirin) .... Take 1 tablet by mouth once a day 8)  Carvedilol 6.25 Mg Tabs (Carvedilol) .... One by mouth two times a day 9)  Lisinopril 10 Mg Tabs (Lisinopril) .... Take 1 tab by mouth daily 10)  Aldactone 25 Mg Tabs (Spironolactone) .... Take 1 tablet by mouth once a day 11)  Klonopin 1 Mg Tabs (Clonazepam) .... Take 1 tab by mouth at bedtime 12)  Tramadol Hcl 50 Mg Tabs (Tramadol hcl) .... One q8h as needed pain 13)  Zocor 40 Mg Tabs (Simvastatin) .... Take 1 tablet by mouth once a day 14)  Lidoderm 5 % Ptch  (Lidocaine) .Marland Kitchen.. 1-3 every 12 hours 15)  Proair Hfa 108 (90 Base) Mcg/act Aers (Albuterol sulfate) .... 2 puffs q4h as needed shortness of breath 16)  Humulin N 100 Unit/ml Susp (Insulin isophane human) .... 30 units subcutaneously q am 17)  Lac-hydrin 12 % Lotn (Ammonium lactate) .... Apply every morning to affected area 18)  Gabapentin 600 Mg Tabs (Gabapentin) .... One by mouth three times a day for neuropathy 19)  Amlodipine Besylate 10 Mg Tabs (Amlodipine besylate) .... One by mouth daily 20)  Flovent Hfa 220 Mcg/act Aero (Fluticasone propionate  hfa) .... Two puffs daily - use every day even if you are not wheezing 21)  Nortriptyline Hcl 25 Mg Caps (Nortriptyline hcl) .... One by mouth daily x 1 week then 1 by mouth two times a day x 1 week then one by mouth three times a day thereafter  Other Orders: Future Orders: TSH[facility] [id number]) ... 09/09/2010 CBC[facility] [id number]) ... 09/03/2010  Patient Instructions: 1)  Start taking two coreg twice a day - when you get your new prescription, it will be for a stronger dose that you only need to take one pill twice a day. 2)  Come to office in one month for blood work.  Come in the morning Nothing to eat that morning before the blood work.   3)  See me in 2 months Prescriptions: CARVEDILOL 6.25 MG TABS (CARVEDILOL) one by mouth two times a day  #60 x 12   Entered and Authorized by:   Madison Hickman MD   Signed by:   Madison Hickman MD on 09/17/2009   Method used:   Electronically to        [facility] ([facility]ail)       [place], [place]  [id number]       Ph: [phone]       Fax: [phone]   RxID:   [id number]    Prevention & Chronic Care Immunizations   Influenza vaccine: Historical  (05/07/2009)   Influenza vaccine due: 03/05/2010    Tetanus booster: 08/05/2005: Historical    Pneumococcal vaccine: Pneumovax  (  02/26/2009)    H. zoster vaccine: Not documented  Colorectal Screening    Hemoccult: Not documented   Hemoccult action/deferral: Not indicated  (04/25/2009)    Colonoscopy: Results: Normal.   (07/05/2002)   Colonoscopy due: 07/05/2012  Other Screening   Pap smear: Not documented   Pap smear action/deferral: Not indicated-other  (01/28/2009)    Mammogram: ASSESSMENT: Negative - BI-RADS 1^MM DIGITAL SCREENING  (05/07/2009)   Mammogram action/deferral: Ordered  (04/25/2009)   Mammogram due: 05/07/2010    DXA bone density scan: Not documented   Smoking status: never  (09/17/2009)  Diabetes Mellitus   HgbA1C: 6.7  (07/25/2009)   HgbA1C action/deferral: Ordered  (04/25/2009)    Eye exam: Not documented    Foot exam: yes  (04/25/2009)   Foot exam action/deferral: Do today   High risk foot: Not documented   Foot care education: Done  (04/25/2009)    Urine microalbumin/creatinine ratio: Not documented   Urine microalbumin action/deferral: Not indicated    Diabetes flowsheet reviewed?: Yes   Progress toward A1C goal: At goal  Lipids   Total Cholesterol: 112  (01/25/2007)   Lipid panel action/deferral: Not indicated   LDL: 55  (01/25/2007)   LDL Direct: 60  (01/15/2009)   HDL: 40  (01/25/2007)   Triglycerides: 86  (01/25/2007)    SGOT (AST): 21  (01/15/2009)   SGPT (ALT): 15  (01/15/2009) CMP ordered    Alkaline phosphatase: 122  (01/15/2009)   Total bilirubin: 0.3  (01/15/2009)  Hypertension   Last Blood Pressure: 147 / 94  (09/17/2009)   Serum creatinine: 1.28  (01/15/2009)   Serum potassium 4.7  (01/15/2009) CMP ordered     Hypertension flowsheet reviewed?: Yes   Progress toward BP goal: Unchanged  Self-Management Support :   Personal Goals (by the next clinic visit) :     Personal A1C goal: 7  (02/26/2009)     Personal blood pressure goal: 130/80  (02/26/2009)     Personal LDL goal: 100  (02/26/2009)    Diabetes self-management support: Written self-care plan  (07/25/2009)    Hypertension self-management support: Written  self-care plan  (07/25/2009)    Lipid self-management support: Written self-care plan  (07/25/2009)

## 2010-08-06 NOTE — Miscellaneous (Signed)
Summary: Lachytrin lotion  Clinical Lists Changes  Medications: Added new medication of LAC-HYDRIN 12 %  LOTN (AMMONIUM LACTATE) apply every morning to affected area - Signed Rx of LAC-HYDRIN 12 %  LOTN (AMMONIUM LACTATE) apply every morning to affected area;  #257ml x 1;  Signed;  Entered by: Aurora Mask FNP;  Authorized by: Aurora Mask FNP;  Method used: Electronic    Prescriptions: LAC-HYDRIN 12 %  LOTN (AMMONIUM LACTATE) apply every morning to affected area  #277ml x 1   Entered and Authorized by:   Aurora Mask FNP   Signed by:   Aurora Mask FNP on 04/13/2007   Method used:   Electronically sent to ...       [facility] [phone] [address] [facility] [facility]       [address]       [place], [place]  [address]       Ph: [phone] or [phone]       Fax: [phone]   RxID:   [id number]

## 2010-08-06 NOTE — Assessment & Plan Note (Signed)
Summary: f/up,tcb   Vital Signs:  Patient profile:   70 year old female Height:      63 inches Weight:      286 pounds BMI:     50.85 Temp:     98.2 degrees F oral Pulse rate:   91 / minute BP sitting:   142 / 84  (left arm)  Vitals Entered By: Isabelle Course (October 17, 2009 10:49 AM)  Serial Vital Signs/Assessments:  Time      Position  BP       Pulse  Resp  Temp     By                     128/78                         Madison Hickman MD  CC: F/U Is Patient Diabetic? Yes Did you bring your meter with you today? No Pain Assessment Patient in pain? no        CC:  F/U.  History of Present Illness: Here for regular recheck of problems plus Left heel hurts every morning.  Diabetes  HBP  Habits & Providers  Alcohol-Tobacco-Diet     Tobacco Status: never  Current Medications (verified): 1)  Metoclopramide Hcl 10 Mg  Tabs (Metoclopramide Hcl) .... Take 1 Tablet By Mouth Four Times A Day 2)  Nexium 40 Mg  Cpdr (Esomeprazole Magnesium) .... Take 1 Tablet By Mouth Once A Day 3)  Synthroid 75 Mcg  Tabs (Levothyroxine Sodium) .... Take 1 Tablet By Mouth Once A Day 4)  Fexofenadine Hcl 180 Mg  Tabs (Fexofenadine Hcl) .... Take 1 Tablet By Mouth Once A Day 5)  Imdur 60 Mg  Tb24 (Isosorbide Mononitrate) .... Take 1 Tablet By Mouth Once A Day 6)  Lexapro 20 Mg  Tabs (Escitalopram Oxalate) .... Take 1 Tablet By Mouth Once A Day 7)  Aspirin Ec 325 Mg  Tbec (Aspirin) .... Take 1 Tablet By Mouth Once A Day 8)  Carvedilol 6.25 Mg Tabs (Carvedilol) .... One By Mouth Two Times A Day 9)  Aldactone 25 Mg  Tabs (Spironolactone) .... Take 1 Tablet By Mouth Once A Day 10)  Klonopin 1 Mg  Tabs (Clonazepam) .... Take 1 Tab By Mouth At Bedtime 11)  Tramadol Hcl 50 Mg  Tabs (Tramadol Hcl) .... One Q8h As Needed Pain 12)  Zocor 40 Mg  Tabs (Simvastatin) .... Take 1 Tablet By Mouth Once A Day 13)  Lidoderm 5 %  Ptch (Lidocaine) .Marland Kitchen.. 1-3 Every 12 Hours 14)  Proair Hfa 108 (90 Base) Mcg/act Aers  (Albuterol Sulfate) .... 2 Puffs Q4h As Needed Shortness of Breath 15)  Humulin N 100 Unit/ml  Susp (Insulin Isophane Human) .... 30 Units Subcutaneously Q Am 16)  Lac-Hydrin 12 %  Lotn (Ammonium Lactate) .... Apply Every Morning To Affected Area 17)  Gabapentin 600 Mg Tabs (Gabapentin) .... One By Mouth Three Times A Day For Neuropathy 18)  Amlodipine Besylate 10 Mg Tabs (Amlodipine Besylate) .... One By Mouth Daily 19)  Flovent Hfa 220 Mcg/act Aero (Fluticasone Propionate  Hfa) .... Two Puffs Daily - Use Every Day Even If You Are Not Wheezing  Allergies (verified): 1)  ! Pcn 2)  ! * Requip 3)  ! * Lyrica 4)  ! Lipitor (Atorvastatin)  Past History:  Past medical, surgical, family and social histories (including risk factors) reviewed, and no changes noted (except as noted below).  Past Medical History: Reviewed history from 03/07/2007 and no changes required. Current Problems:  COGNITIVE IMPAIRMENT, MILD, SO STATED (ICD-331.83) RESTLESS LEG SYNDROME (ICD-333.94) CONGESTIVE HEART FAILURE (ICD-428.0) THYROID NODULE, HX OF (ICD-V12.2) OSTEOARTHROSIS, GENERALIZED, MULTIPLE SITES (ICD-715.09) ANEMIA NOS (ICD-285.9) DIABETES MELLITUS, TYPE II (ICD-250.00) RENAL FAILURE (ICD-586) HYPOTHYROIDISM (ICD-244.9) HYPERTENSION (ICD-401.9) GERD (ICD-530.81) DEPRESSION (ICD-311) CORONARY ARTERY DISEASE (ICD-414.00) ASTHMA (ICD-493.90) SLEEP APNEA (ICD-780.57) DYSLIPIDEMIA (ICD-272.4) DM (ICD-250.00)  Past Surgical History: Reviewed history from 03/07/2007 and no changes required. Cholecystectomy Tubal ligation Carpal tunnel release  s/p right knee arthroscopy Renal u/s 08/19/2005  Family History: Reviewed history from 12/20/2008 and no changes required. Family History of CAD Female 1st degree relative- mother Family History Diabetes 1st degree relative- mother  Social History: Reviewed history from 12/20/2008 and no changes required. Lives alone.  She is retired.  Quit smoking  cigarettes, does not use alcohol or illicit drugs.  Smoking Status:  never  Physical Exam  General:  Well-developed,well-nourished,in no acute distress; alert,appropriate and cooperative throughout examination Neck:  No deformities, masses, or tenderness noted. Lungs:  Normal respiratory effort, chest expands symmetrically. Lungs are clear to auscultation, no crackles or wheezes. Heart:  Normal rate and regular rhythm. S1 and S2 normal without gallop, murmur, click, rub or other extra sounds. Extremities:  Normal foot - could not elicit any area of tenderness.  She pointed to mid heel when asked where morning pain typically is  Diabetes Management Exam:    Foot Exam (with socks and/or shoes not present):       Sensory-Monofilament:          Left foot: normal          Right foot: normal       Inspection:          Left foot: normal          Right foot: normal       Nails:          Left foot: normal          Right foot: normal    Eye Exam:       Eye Exam done elsewhere          Date: 07/07/2009          Results: normal   Impression & Recommendations:  Problem # 1:  HEEL PAIN (ICD-729.5)  Probably plantar fascitis.  Since I could not elicit pain, I could not make a firm dx.  Discussed exercises.  Orders: [place]- Est  Level 4 [id number])  Problem # 2:  DIABETES MELLITUS, TYPE II (ICD-250.00)  recent nl A1C.  Will  try to get opth results Her updated medication list for this problem includes:    Aspirin Ec 325 Mg Tbec (Aspirin) .Marland Kitchen... Take 1 tablet by mouth once a day    Humulin N 100 Unit/ml Susp (Insulin isophane human) .Marland KitchenMarland KitchenMarland KitchenMarland Kitchen 30 units subcutaneously q am  Labs Reviewed: Creat: 1.64 (10/03/2009)    Reviewed HgBA1c results: 6.8 (10/03/2009)  6.7 (07/25/2009)  Orders: [place]- Est  Level 4 [id number])  Problem # 3:  ANEMIA NOS (ICD-285.9) Blood work suggests fe deficiency, will verify. Orders: [facility] [id number]) [place]- Est  Level 4 [id number])  Complete Medication List: 1)   Metoclopramide Hcl 10 Mg Tabs (Metoclopramide hcl) .... Take 1 tablet by mouth four times a day 2)  Nexium 40 Mg Cpdr (Esomeprazole magnesium) .... Take 1 tablet by mouth once a day 3)  Synthroid 75 Mcg Tabs (Levothyroxine sodium) .... Take 1 tablet by mouth once a  day 4)  Fexofenadine Hcl 180 Mg Tabs (Fexofenadine hcl) .... Take 1 tablet by mouth once a day 5)  Imdur 60 Mg Tb24 (Isosorbide mononitrate) .... Take 1 tablet by mouth once a day 6)  Lexapro 20 Mg Tabs (Escitalopram oxalate) .... Take 1 tablet by mouth once a day 7)  Aspirin Ec 325 Mg Tbec (Aspirin) .... Take 1 tablet by mouth once a day 8)  Carvedilol 6.25 Mg Tabs (Carvedilol) .... One by mouth two times a day 9)  Aldactone 25 Mg Tabs (Spironolactone) .... Take 1 tablet by mouth once a day 10)  Klonopin 1 Mg Tabs (Clonazepam) .... Take 1 tab by mouth at bedtime 11)  Tramadol Hcl 50 Mg Tabs (Tramadol hcl) .... One q8h as needed pain 12)  Zocor 40 Mg Tabs (Simvastatin) .... Take 1 tablet by mouth once a day 13)  Lidoderm 5 % Ptch (Lidocaine) .Marland Kitchen.. 1-3 every 12 hours 14)  Proair Hfa 108 (90 Base) Mcg/act Aers (Albuterol sulfate) .... 2 puffs q4h as needed shortness of breath 15)  Humulin N 100 Unit/ml Susp (Insulin isophane human) .... 30 units subcutaneously q am 16)  Lac-hydrin 12 % Lotn (Ammonium lactate) .... Apply every morning to affected area 17)  Gabapentin 600 Mg Tabs (Gabapentin) .... One by mouth three times a day for neuropathy 18)  Amlodipine Besylate 10 Mg Tabs (Amlodipine besylate) .... One by mouth daily 19)  Flovent Hfa 220 Mcg/act Aero (Fluticasone propionate  hfa) .... Two puffs daily - use every day even if you are not wheezing   Prevention & Chronic Care Immunizations   Influenza vaccine: Historical  (05/07/2009)   Influenza vaccine due: 03/05/2010    Tetanus booster: 08/05/2005: Historical    Pneumococcal vaccine: Pneumovax  (02/26/2009)    H. zoster vaccine: Not documented  Colorectal Screening    Hemoccult: Not documented   Hemoccult action/deferral: Not indicated  (04/25/2009)    Colonoscopy: Results: Normal.   (07/05/2002)   Colonoscopy due: 07/05/2012  Other Screening   Pap smear: Not documented   Pap smear action/deferral: Not indicated-other  (01/28/2009)    Mammogram: ASSESSMENT: Negative - BI-RADS 1^MM DIGITAL SCREENING  (05/07/2009)   Mammogram action/deferral: Ordered  (04/25/2009)   Mammogram due: 05/07/2010    DXA bone density scan: Not documented  Reports requested:  Smoking status: never  (10/17/2009)  Diabetes Mellitus   HgbA1C: 6.8  (10/03/2009)   HgbA1C action/deferral: Ordered  (04/25/2009)    Eye exam: Not documented   Last eye exam report requested.    Foot exam: yes  (10/17/2009)   Foot exam action/deferral: Do today   High risk foot: Not documented   Foot care education: Done  (04/25/2009)    Urine microalbumin/creatinine ratio: Not documented   Urine microalbumin action/deferral: Not indicated    Diabetes flowsheet reviewed?: Yes   Progress toward A1C goal: At goal  Lipids   Total Cholesterol: 117  (10/03/2009)   Lipid panel action/deferral: Not indicated   LDL: 69  (10/03/2009)   LDL Direct: 60  (01/15/2009)   HDL: 36  (10/03/2009)   Triglycerides: 58  (10/03/2009)    SGOT (AST): 20  (10/03/2009)   SGPT (ALT): 14  (10/03/2009)   Alkaline phosphatase: 113  (10/03/2009)   Total bilirubin: 0.4  (10/03/2009)    Lipid flowsheet reviewed?: Yes   Progress toward LDL goal: At goal  Hypertension   Last Blood Pressure: 142 / 84  (10/17/2009)   Serum creatinine: 1.64  (10/03/2009)   Serum potassium 4.9  (  10/03/2009)  Self-Management Support :   Personal Goals (by the next clinic visit) :     Personal A1C goal: 7  (02/26/2009)     Personal blood pressure goal: 130/80  (02/26/2009)     Personal LDL goal: 100  (02/26/2009)    Diabetes self-management support: Written self-care plan  (07/25/2009)    Hypertension self-management  support: Written self-care plan  (07/25/2009)    Lipid self-management support: Written self-care plan  (07/25/2009)    Nursing Instructions: Request report of last diabetic eye exam

## 2010-08-06 NOTE — Progress Notes (Signed)
Summary: phn msg   Phone Note Other Incoming Call back at [phone]   Caller: [facility] Summary of Call: Concerning the care they are to provide for Brittany Roberts. Initial call taken by: Raymond Gurney,  September 23, 2009 10:22 AM  Follow-up for Phone Call        To PCP Follow-up by: Isabelle Course,  September 23, 2009 10:23 AM  Additional Follow-up for Phone Call Additional follow up Details #1::        Called and spoke to [place].  Patient asked him to call me but was unclear why.  Probably about PT which I have taken care of.  This group provides PCS for Ms. Rauf.  No follow up needed unless patient initiates. Additional Follow-up by: Madison Hickman MD,  September 23, 2009 10:36 AM

## 2010-08-06 NOTE — Letter (Signed)
Summary: *Referral Letter  [facility]  [address]   [place], [place] [address]   Phone: [phone]  Fax: [phone]    05/06/2009  Chade Monacelli [address] [place], [place]  [address]  Phone: [phone]  To [place],   I am Ms Stankovic's family physician and she asked that I provide this letter to support her request to be excused from jury duty.  Briefly, Ms Bordeaux is 70 years old and has a host of medical problems as listed below.  She is not medically able to serve as a juror.  Please excuse her.  Please contact me if you need further information.     Current Medical Problems: 1)  DIABETIC NEUROPATHY, TYPE 2 (ICD-250.60) 2)  OSTEOARTHRITIS (ICD-715.90) 3)  RELIGION AFFECTING CARE (ICD-V62.6) 4)  LUPUS ERYTHEMATOSUS, DISCOID (ICD-695.4) 5)  COGNITIVE IMPAIRMENT, MILD, SO STATED (ICD-331.83) 6)  RESTLESS LEG SYNDROME (ICD-333.94) 7)  CONGESTIVE HEART FAILURE (ICD-428.0) 8)  THYROID NODULE, HX OF (ICD-V12.2) 9)  OSTEOARTHROSIS, GENERALIZED, MULTIPLE SITES (ICD-715.09) 10)  ANEMIA NOS (ICD-285.9) 11)  DIABETES MELLITUS, TYPE II (ICD-250.00) 12)  RENAL FAILURE (ICD-586) 13)  HYPOTHYROIDISM (ICD-244.9) 14)  HYPERTENSION (ICD-401.9) 15)  GERD (ICD-530.81) 16)  DEPRESSION (ICD-311) 17)  CORONARY ARTERY DISEASE (ICD-414.00) 18)  ASTHMA (ICD-493.90) 19)  SLEEP APNEA (ICD-780.57) 20)  DYSLIPIDEMIA (ICD-272.4)   Sincerely,     Madison Hickman MD

## 2010-08-06 NOTE — Progress Notes (Signed)
Summary: phn msg   Phone Note Call from Patient Call back at Home Phone [phone]   Caller: Patient Summary of Call: Pt would like to talk to Dr. [name] about getting therapy to learn how to walk with a walker. Initial call taken by: Raymond Gurney,  September 23, 2009 9:25 AM  Follow-up for Phone Call        Called.  Best option is physical therapy.  She agrees and is willing.  Order entered Follow-up by: Madison Hickman MD,  September 23, 2009 10:21 AM  Additional Follow-up for Phone Call Additional follow up Details #1::        Paperwork completed and faxed to [facility] street Additional Follow-up by: Isabelle Course,  September 23, 2009 11:26 AM

## 2010-08-06 NOTE — Miscellaneous (Signed)
Summary: Daughter died   Clinical Lists Changes Called to talk about blood work.  She told me that her daughter had just died unexpectedly.  Offered support and condolenses.  Has possible iron deficiency on CBC.  Had normal colonoscopy 2004.  Will address next visit. Madison Hickman MD  October 06, 2009 11:47 AM        Prevention & Chronic Care Immunizations   Influenza vaccine: Historical  (05/07/2009)   Influenza vaccine due: 03/05/2010    Tetanus booster: 08/05/2005: Historical    Pneumococcal vaccine: Pneumovax  (02/26/2009)    H. zoster vaccine: Not documented  Colorectal Screening   Hemoccult: Not documented   Hemoccult action/deferral: Not indicated  (04/25/2009)    Colonoscopy: Results: Normal.   (07/05/2002)   Colonoscopy due: 07/05/2012  Other Screening   Pap smear: Not documented   Pap smear action/deferral: Not indicated-other  (01/28/2009)    Mammogram: ASSESSMENT: Negative - BI-RADS 1^MM DIGITAL SCREENING  (05/07/2009)   Mammogram action/deferral: Ordered  (04/25/2009)   Mammogram due: 05/07/2010    DXA bone density scan: Not documented   Smoking status: quit  (10/03/2009)  Diabetes Mellitus   HgbA1C: 6.8  (10/03/2009)   HgbA1C action/deferral: Ordered  (04/25/2009)    Eye exam: Not documented    Foot exam: yes  (04/25/2009)   Foot exam action/deferral: Do today   High risk foot: Not documented   Foot care education: Done  (04/25/2009)    Urine microalbumin/creatinine ratio: Not documented   Urine microalbumin action/deferral: Not indicated  Lipids   Total Cholesterol: 117  (10/03/2009)   Lipid panel action/deferral: Not indicated   LDL: 69  (10/03/2009)   LDL Direct: 60  (01/15/2009)   HDL: 36  (10/03/2009)   Triglycerides: 58  (10/03/2009)    SGOT (AST): 20  (10/03/2009)   SGPT (ALT): 14  (10/03/2009)   Alkaline phosphatase: 113  (10/03/2009)   Total bilirubin: 0.4  (10/03/2009)  Hypertension   Last Blood Pressure: 130 / 73   (10/03/2009)   Serum creatinine: 1.64  (10/03/2009)   Serum potassium 4.9  (10/03/2009)  Self-Management Support :   Personal Goals (by the next clinic visit) :     Personal A1C goal: 7  (02/26/2009)     Personal blood pressure goal: 130/80  (02/26/2009)     Personal LDL goal: 100  (02/26/2009)    Diabetes self-management support: Written self-care plan  (07/25/2009)    Hypertension self-management support: Written self-care plan  (07/25/2009)    Lipid self-management support: Written self-care plan  (07/25/2009)

## 2010-08-06 NOTE — Miscellaneous (Signed)
Summary: CHF clarification   Clinical Lists Changes Clarifying CHF diagnosis.  Based on last echo of 08/20/05, had normal EF and increased LV wall thickness Problems: Changed problem from [place] (ICD-428.0) to CHRONIC DIASTOLIC HEART FAILURE (0000000)

## 2010-08-06 NOTE — Miscellaneous (Signed)
Summary: Why not on ACE?   Clinical Lists Changes Discuss why not on ACE next visit.

## 2010-08-06 NOTE — Assessment & Plan Note (Signed)
Summary: sore throat/Brittany Roberts/Brittany Roberts   Vital Signs:  Patient profile:   70 year old female Height:      63 inches Weight:      290 pounds BMI:     51.56 Pulse rate:   81 / minute BP sitting:   130 / 73  (right arm)  Vitals Entered By: Mauricia Area CMA, (October 03, 2009 1:37 PM) CC: dry mouth x 1 week. feels shaky x 1 day Is Patient Diabetic? Yes Pain Assessment Patient in pain? no        CC:  dry mouth x 1 week. feels shaky x 1 day.  History of Present Illness: Believes that lininopril makes her not void, she refuses to take it.   She believes the nortriptlyine has made her mouth dry and her tremor worse. Tremor use to be in her hands now it is all over. She is driving but has driven up on the side walk twice today.  She thinks maybe she should not drive.  She just got new glasses.  She believes that she has one kidney, a past doctor told her one kidney shrunk up, we have two normal appearing kidneys on [place] in 07.  Has received her walker but did not bring it, too much to carry around.  She is on a diet and only had a small amount of Oatmeal this morning.  Though it would be OK to get lipids today.  Habits & Providers  Alcohol-Tobacco-Diet     Tobacco Status: quit  Current Medications (verified): 1)  Metoclopramide Hcl 10 Mg  Tabs (Metoclopramide Hcl) .... Take 1 Tablet By Mouth Four Times A Day 2)  Nexium 40 Mg  Cpdr (Esomeprazole Magnesium) .... Take 1 Tablet By Mouth Once A Day 3)  Synthroid 75 Mcg  Tabs (Levothyroxine Sodium) .... Take 1 Tablet By Mouth Once A Day 4)  Fexofenadine Hcl 180 Mg  Tabs (Fexofenadine Hcl) .... Take 1 Tablet By Mouth Once A Day 5)  Imdur 60 Mg  Tb24 (Isosorbide Mononitrate) .... Take 1 Tablet By Mouth Once A Day 6)  Lexapro 20 Mg  Tabs (Escitalopram Oxalate) .... Take 1 Tablet By Mouth Once A Day 7)  Aspirin Ec 325 Mg  Tbec (Aspirin) .... Take 1 Tablet By Mouth Once A Day 8)  Carvedilol 6.25 Mg Tabs (Carvedilol) .... One By Mouth Two Times A  Day 9)  Aldactone 25 Mg  Tabs (Spironolactone) .... Take 1 Tablet By Mouth Once A Day 10)  Klonopin 1 Mg  Tabs (Clonazepam) .... Take 1 Tab By Mouth At Bedtime 11)  Tramadol Hcl 50 Mg  Tabs (Tramadol Hcl) .... One Q8h As Needed Pain 12)  Zocor 40 Mg  Tabs (Simvastatin) .... Take 1 Tablet By Mouth Once A Day 13)  Lidoderm 5 %  Ptch (Lidocaine) .Marland Kitchen.. 1-3 Every 12 Hours 14)  Proair Hfa 108 (90 Base) Mcg/act Aers (Albuterol Sulfate) .... 2 Puffs Q4h As Needed Shortness of Breath 15)  Humulin N 100 Unit/ml  Susp (Insulin Isophane Human) .... 30 Units Subcutaneously Q Am 16)  Lac-Hydrin 12 %  Lotn (Ammonium Lactate) .... Apply Every Morning To Affected Area 17)  Gabapentin 600 Mg Tabs (Gabapentin) .... One By Mouth Three Times A Day For Neuropathy 18)  Amlodipine Besylate 10 Mg Tabs (Amlodipine Besylate) .... One By Mouth Daily 19)  Flovent Hfa 220 Mcg/act Aero (Fluticasone Propionate  Hfa) .... Two Puffs Daily - Use Every Day Even If You Are Not Wheezing  Allergies (verified):  1)  ! Pcn 2)  ! * Requip 3)  ! * Lyrica 4)  ! Lipitor (Atorvastatin)  Social History: Smoking Status:  quit  Review of Systems      See HPI General:  Denies fever, sweats, and weakness.  Physical Exam  General:  Alert, low literacy, pleasant Mouth:  membranes moist Lungs:  normal respiratory effort and normal breath sounds.   Heart:  normal rate and regular rhythm.   Neurologic:  finger to nose testing reveled a tremor present on rest and with movement, bilateral, of low amplitude   Impression & Recommendations:  Problem # 1:  DIABETIC NEUROPATHY, TYPE 2 (ICD-250.60)  Self discontinued tricyclic The following medications were removed from the medication list:    Lisinopril 10 Mg Tabs (Lisinopril) .Marland Kitchen... Take 1 tab by mouth daily Her updated medication list for this problem includes:    Aspirin Ec 325 Mg Tbec (Aspirin) .Marland Kitchen... Take 1 tablet by mouth once a day    Humulin N 100 Unit/ml Susp (Insulin isophane  human) .Marland KitchenMarland KitchenMarland KitchenMarland Kitchen 30 units subcutaneously q am  Orders: [place]- Est  Level 4 [id number])  Problem # 2:  COGNITIVE IMPAIRMENT, MILD, SO STATED (ICD-331.83)  Belief that she has one kidney despite imaging proof, belief that lisinopril make her stop voiding and will not take.  BP 130-73 today, off lisinopril.  Orders: [place]- Est  Level 4 [id number])  Problem # 3:  IDIOPATHIC TREMOR (ICD-781.0)  Rull out metabolic causes, labs today.  She denies family history of such.  On a beta blocker (propanolol usually more effective as it crosses BBB, consider primadone if appears to be benign essential type)  Orders: [place]- Est  Level 4 [id number])  Complete Medication List: 1)  Metoclopramide Hcl 10 Mg Tabs (Metoclopramide hcl) .... Take 1 tablet by mouth four times a day 2)  Nexium 40 Mg Cpdr (Esomeprazole magnesium) .... Take 1 tablet by mouth once a day 3)  Synthroid 75 Mcg Tabs (Levothyroxine sodium) .... Take 1 tablet by mouth once a day 4)  Fexofenadine Hcl 180 Mg Tabs (Fexofenadine hcl) .... Take 1 tablet by mouth once a day 5)  Imdur 60 Mg Tb24 (Isosorbide mononitrate) .... Take 1 tablet by mouth once a day 6)  Lexapro 20 Mg Tabs (Escitalopram oxalate) .... Take 1 tablet by mouth once a day 7)  Aspirin Ec 325 Mg Tbec (Aspirin) .... Take 1 tablet by mouth once a day 8)  Carvedilol 6.25 Mg Tabs (Carvedilol) .... One by mouth two times a day 9)  Aldactone 25 Mg Tabs (Spironolactone) .... Take 1 tablet by mouth once a day 10)  Klonopin 1 Mg Tabs (Clonazepam) .... Take 1 tab by mouth at bedtime 11)  Tramadol Hcl 50 Mg Tabs (Tramadol hcl) .... One q8h as needed pain 12)  Zocor 40 Mg Tabs (Simvastatin) .... Take 1 tablet by mouth once a day 13)  Lidoderm 5 % Ptch (Lidocaine) .Marland Kitchen.. 1-3 every 12 hours 14)  Proair Hfa 108 (90 Base) Mcg/act Aers (Albuterol sulfate) .... 2 puffs q4h as needed shortness of breath 15)  Humulin N 100 Unit/ml Susp (Insulin isophane human) .... 30 units subcutaneously q am 16)  Lac-hydrin 12 %  Lotn (Ammonium lactate) .... Apply every morning to affected area 17)  Gabapentin 600 Mg Tabs (Gabapentin) .... One by mouth three times a day for neuropathy 18)  Amlodipine Besylate 10 Mg Tabs (Amlodipine besylate) .... One by mouth daily 19)  Flovent Hfa 220 Mcg/act Aero (Fluticasone propionate  hfa) .Marland KitchenMarland KitchenMarland Kitchen  Two puffs daily - use every day even if you are not wheezing  Patient Instructions: 1)  Please schedule a follow-up appointment in 1 month.  2)  Stop driving until we figure out why she is shaking, have your daughter drive you around. 3)  I will call you next week to see how you are doing.  Laboratory Results   Blood Tests   Date/Time Received: October 03, 2009 2:01 PM  Date/Time Reported: October 03, 2009 2:29 PM   HGBA1C: 6.8%   (Normal Range: Non-Diabetic - 3-6%   Control Diabetic - 6-8%)  Comments: ...............test performed by......Marland KitchenBonnie A. [name], MLS (ASCP)cm

## 2010-08-06 NOTE — Progress Notes (Signed)
Summary: needs rx   Phone Note Call from Patient Call back at Home Phone [phone]   Caller: Patient Summary of Call: pt needs rx for a walker Initial call taken by: Audie Clear,  May 13, 2010 2:43 PM  Follow-up for Phone Call        Hand wrote Rx Follow-up by: Madison Hickman MD,  May 15, 2010 8:19 AM  Additional Follow-up for Phone Call Additional follow up Details #1::        faxed to [facility] Additional Follow-up by: Enid Skeens, [place],  May 15, 2010 3:52 PM

## 2010-08-06 NOTE — Progress Notes (Signed)
Summary: Rx Req  Medications Added FEXOFENADINE HCL 180 MG  TABS (FEXOFENADINE HCL) Take 1 tablet by mouth once a day       Phone Note Call from Patient Call back at Home Phone [phone]   Caller: Patient Summary of Call: Pt has allgeries and needs something for this.  Pharmacy is [facility]. Initial call taken by: Raymond Gurney,  December 25, 2009 10:58 AM  Follow-up for Phone Call        Not taking fexofenidine per patient.  Will refill and seems OK with formulary Follow-up by: Madison Hickman MD,  December 25, 2009 12:00 PM    New/Updated Medications: FEXOFENADINE HCL 180 MG  TABS (FEXOFENADINE HCL) Take 1 tablet by mouth once a day Prescriptions: FEXOFENADINE HCL 180 MG  TABS (FEXOFENADINE HCL) Take 1 tablet by mouth once a day  #30 x 12   Entered and Authorized by:   Madison Hickman MD   Signed by:   Madison Hickman MD on 12/25/2009   Method used:   Electronically to        [facility] (retail)       [place], [place]  [id number]       Ph: [phone]       Fax: [phone]   RxID:   [id number]

## 2010-08-06 NOTE — Progress Notes (Signed)
Summary: phn msg   Phone Note Call from Patient Call back at [facility] Phone [phone]   Caller: Patient Summary of Call: eye doctor name is Leodis Binet [id number] Initial call taken by: Audie Clear,  Nov 06, 2009 10:01 AM  Follow-up for Phone Call        to PCP, spoke with pt she just saw him and got new glasses this year, told her if you want records we would have her sign a release form on her next apt with you since we did not refer her Follow-up by: Isabelle Course,  Nov 06, 2009 10:04 AM  Additional Follow-up for Phone Call Additional follow up Details #1::        noted and eye exam documented in flow sheet. Additional Follow-up by: Madison Hickman MD,  Nov 07, 2009 8:42 AM      Prevention & Chronic Care Immunizations   Influenza vaccine: Historical  (05/07/2009)   Influenza vaccine due: 03/05/2010    Tetanus booster: 08/05/2005: Historical    Pneumococcal vaccine: Pneumovax  (02/26/2009)    H. zoster vaccine: Not documented  Colorectal Screening   Hemoccult: Not documented   Hemoccult action/deferral: Not indicated  (04/25/2009)    Colonoscopy: Results: Normal.   (07/05/2002)   Colonoscopy due: 07/05/2012  Other Screening   Pap smear: Not documented   Pap smear action/deferral: Not indicated-other  (01/28/2009)    Mammogram: ASSESSMENT: Negative - BI-RADS 1^MM DIGITAL SCREENING  (05/07/2009)   Mammogram action/deferral: Ordered  (04/25/2009)   Mammogram due: 05/07/2010    DXA bone density scan: Not documented   Smoking status: never  (10/17/2009)  Diabetes Mellitus   HgbA1C: 6.8  (10/03/2009)   HgbA1C action/deferral: Ordered  (04/25/2009)    Eye exam: normal  (11/04/2009)   Eye exam due: 11/2010    Foot exam: yes  (10/17/2009)   Foot exam action/deferral: Do today   High risk foot: Not documented   Foot care education: Done  (04/25/2009)    Urine microalbumin/creatinine ratio: Not documented   Urine microalbumin action/deferral: Not  indicated  Lipids   Total Cholesterol: 117  (10/03/2009)   Lipid panel action/deferral: Not indicated   LDL: 69  (10/03/2009)   LDL Direct: 60  (01/15/2009)   HDL: 36  (10/03/2009)   Triglycerides: 58  (10/03/2009)    SGOT (AST): 20  (10/03/2009)   SGPT (ALT): 14  (10/03/2009)   Alkaline phosphatase: 113  (10/03/2009)   Total bilirubin: 0.4  (10/03/2009)  Hypertension   Last Blood Pressure: 142 / 84  (10/17/2009)   Serum creatinine: 1.64  (10/03/2009)   Serum potassium 4.9  (10/03/2009)  Self-Management Support :   Personal Goals (by the next clinic visit) :     Personal A1C goal: 7  (02/26/2009)     Personal blood pressure goal: 130/80  (02/26/2009)     Personal LDL goal: 100  (02/26/2009)    Diabetes self-management support: Written self-care plan  (07/25/2009)    Hypertension self-management support: Written self-care plan  (07/25/2009)    Lipid self-management support: Written self-care plan  (07/25/2009)     Allergies: 1)  ! Pcn 2)  ! * Requip 3)  ! * Lyrica 4)  ! Lipitor (Atorvastatin)    Diabetes Management Exam:    Eye Exam:       Eye Exam done elsewhere          Date: 11/04/2009          Results: normal  Done by: Janeal Holmes

## 2010-08-06 NOTE — Progress Notes (Signed)
Summary: pain    Phone Note Call from Patient Call back at Home Phone [phone]   Caller: Patient Summary of Call: pt asking to speak with Janylah Belgrave about pain issue buttocks region.   Initial call taken by: Laurena Slimmer,  January 20, 2009 10:11 AM  Follow-up for Phone Call        pt has a boil on her rectal area. she has been using triple antibiotic cream. she states it is the size of a dime. it is painful to touch and sit on. she wants an rx for this. she states the doctor looked at this area at the last ov. Follow-up by: Carolyne Littles,  January 20, 2009 11:49 AM  Additional Follow-up for Phone Call Additional follow up Details #1::        Had attempted to call previously and finally got in touch today.  Her buttock area is feeling better so no Rx is needed. Additional Follow-up by: Madison Hickman MD,  January 22, 2009 4:04 PM

## 2010-08-06 NOTE — Assessment & Plan Note (Signed)
Summary: f/u,df   Vital Signs:  Patient profile:   70 year old female Weight:      288 pounds BMI:     51.20 Temp:     97.9 degrees F BP sitting:   145 / 77  Vitals Entered By: April Manson CMA (April 25, 2009 1:38 PM)  History of Present Illness: Cough for more than two months.  Stays short of breath but this is not new.  Coughing is new.  Productive of white to clear mucous.  No fever.  Hx of asthma.  Not currently on a controler med  Flu shot  Leg pain some pain at rest.  Much worse pain with walking.  Pain is equal bilaterally.  Feels pain mostly just below kneees and lateral thighs.  Has been told she has arthritis all over - definately in hips.  Current Medications (verified): 1)  Metoclopramide Hcl 10 Mg  Tabs (Metoclopramide Hcl) .... Take 1 Tablet By Mouth Four Times A Day 2)  Nexium 40 Mg  Cpdr (Esomeprazole Magnesium) .... Take 1 Tablet By Mouth Once A Day 3)  Synthroid 75 Mcg  Tabs (Levothyroxine Sodium) .... Take 1 Tablet By Mouth Once A Day 4)  Fexofenadine Hcl 180 Mg  Tabs (Fexofenadine Hcl) .... Take 1 Tablet By Mouth Once A Day 5)  Imdur 60 Mg  Tb24 (Isosorbide Mononitrate) .... Take 1 Tablet By Mouth Once A Day 6)  Lexapro 20 Mg  Tabs (Escitalopram Oxalate) .... Take 1 Tablet By Mouth Once A Day 7)  Aspirin Ec 325 Mg  Tbec (Aspirin) .... Take 1 Tablet By Mouth Once A Day 8)  Coreg 3.125 Mg  Tabs (Carvedilol) .... Take 1 Tablet By Mouth Two Times A Day 9)  Lisinopril 10 Mg  Tabs (Lisinopril) .... Take 1 Tab By Mouth Daily 10)  Aldactone 25 Mg  Tabs (Spironolactone) .... Take 1 Tablet By Mouth Once A Day 11)  Klonopin 1 Mg  Tabs (Clonazepam) .... Take 1 Tab By Mouth At Bedtime 12)  Hydrocodone-Acetaminophen 5-500 Mg  Tabs (Hydrocodone-Acetaminophen) .Marland Kitchen.. 1 Tablets By Mouth Q 6 Hours As Needed Severe Pain 13)  Zocor 40 Mg  Tabs (Simvastatin) .... Take 1 Tablet By Mouth Once A Day 14)  Lidoderm 5 %  Ptch (Lidocaine) .Marland Kitchen.. 1-3 Every 12 Hours 15)  Proventil Hfa 108 (90  Base) Mcg/act Aers (Albuterol Sulfate) .... Two Puffs Every Four Hours As Needed Wheezing 16)  Humulin N 100 Unit/ml  Susp (Insulin Isophane Human) .... 30 Units Subcutaneously Q Am 17)  Lac-Hydrin 12 %  Lotn (Ammonium Lactate) .... Apply Every Morning To Affected Area 18)  Gabapentin 600 Mg Tabs (Gabapentin) .... One By Mouth Three Times A Day For Neuropathy 19)  Amlodipine Besylate 10 Mg Tabs (Amlodipine Besylate) .... One By Mouth Daily 20)  Flovent Hfa 220 Mcg/act Aero (Fluticasone Propionate  Hfa) .... Two Puffs Daily - Use Every Day Even If You Are Not Wheezing  Allergies (verified): 1)  ! Pcn 2)  ! * Requip 3)  ! * Lyrica 4)  ! Lipitor (Atorvastatin)  Past History:  Past medical, surgical, family and social histories (including risk factors) reviewed, and no changes noted (except as noted below).  Past Medical History: Reviewed history from 03/07/2007 and no changes required. Current Problems:  COGNITIVE IMPAIRMENT, MILD, SO STATED (ICD-331.83) RESTLESS LEG SYNDROME (ICD-333.94) CONGESTIVE HEART FAILURE (ICD-428.0) THYROID NODULE, HX OF (ICD-V12.2) OSTEOARTHROSIS, GENERALIZED, MULTIPLE SITES (ICD-715.09) ANEMIA NOS (ICD-285.9) DIABETES MELLITUS, TYPE II (ICD-250.00) RENAL FAILURE (ICD-586)  HYPOTHYROIDISM (ICD-244.9) HYPERTENSION (ICD-401.9) GERD (ICD-530.81) DEPRESSION (ICD-311) CORONARY ARTERY DISEASE (ICD-414.00) ASTHMA (ICD-493.90) SLEEP APNEA (ICD-780.57) DYSLIPIDEMIA (ICD-272.4) DM (ICD-250.00)  Past Surgical History: Reviewed history from 03/07/2007 and no changes required. Cholecystectomy Tubal ligation Carpal tunnel release  s/p right knee arthroscopy Renal u/s 08/19/2005  Family History: Reviewed history from 12/20/2008 and no changes required. Family History of CAD Female 1st degree relative- mother Family History Diabetes 1st degree relative- mother  Social History: Reviewed history from 12/20/2008 and no changes required. Lives alone.  She is  retired.  Quit smoking cigarettes, does not use alcohol or illicit drugs.    Physical Exam  General:  Well-developed,well-nourished,in no acute distress; alert,appropriate and cooperative throughout examination Lungs:  Normal respiratory effort, chest expands symmetrically. Lungs are clear to auscultation, no crackles.  Does have prolonged exp phase and end exp wheezes.  Diabetes Management Exam:    Foot Exam (with socks and/or shoes not present):       Sensory-Monofilament:          Left foot: abnormal          Right foot: abnormal   Impression & Recommendations:  Problem # 1:  ASTHMA (ICD-493.90) Assessment Deteriorated  Feel cough is due to asthma.  Add controler flovent Her updated medication list for this problem includes:    Proventil Hfa 108 (90 Base) Mcg/act Aers (Albuterol sulfate) .Marland Kitchen..Marland Kitchen Two puffs every four hours as needed wheezing    Flovent Hfa 220 Mcg/act Aero (Fluticasone propionate  hfa) .Marland Kitchen..Marland Kitchen Two puffs daily - use every day even if you are not wheezing  Orders: [place]- Est  Level 4 [id number])  Problem # 2:  DIABETIC NEUROPATHY, TYPE 2 (ICD-250.60) Assessment: Deteriorated  increased gabapentin Her updated medication list for this problem includes:    Aspirin Ec 325 Mg Tbec (Aspirin) .Marland Kitchen... Take 1 tablet by mouth once a day    Lisinopril 10 Mg Tabs (Lisinopril) .Marland Kitchen... Take 1 tab by mouth daily    Humulin N 100 Unit/ml Susp (Insulin isophane human) .Marland KitchenMarland KitchenMarland KitchenMarland Kitchen 30 units subcutaneously q am  Orders: [place]- Est  Level 4 [id number])  Problem # 3:  Preventive Health Care (ICD-V70.0) flu shot and mammogram  Problem # 4:  DIABETES MELLITUS, TYPE II (ICD-250.00) At goal but weight up.  Encouraged wt loss Her updated medication list for this problem includes:    Aspirin Ec 325 Mg Tbec (Aspirin) .Marland Kitchen... Take 1 tablet by mouth once a day    Lisinopril 10 Mg Tabs (Lisinopril) .Marland Kitchen... Take 1 tab by mouth daily    Humulin N 100 Unit/ml Susp (Insulin isophane human) .Marland KitchenMarland KitchenMarland KitchenMarland Kitchen 30 units  subcutaneously q am  Orders: [facility] [id number]) [place]- Est  Level 4 [id number])  Complete Medication List: 1)  Metoclopramide Hcl 10 Mg Tabs (Metoclopramide hcl) .... Take 1 tablet by mouth four times a day 2)  Nexium 40 Mg Cpdr (Esomeprazole magnesium) .... Take 1 tablet by mouth once a day 3)  Synthroid 75 Mcg Tabs (Levothyroxine sodium) .... Take 1 tablet by mouth once a day 4)  Fexofenadine Hcl 180 Mg Tabs (Fexofenadine hcl) .... Take 1 tablet by mouth once a day 5)  Imdur 60 Mg Tb24 (Isosorbide mononitrate) .... Take 1 tablet by mouth once a day 6)  Lexapro 20 Mg Tabs (Escitalopram oxalate) .... Take 1 tablet by mouth once a day 7)  Aspirin Ec 325 Mg Tbec (Aspirin) .... Take 1 tablet by mouth once a day 8)  Coreg 3.125 Mg Tabs (Carvedilol) .... Take 1 tablet by mouth  two times a day 9)  Lisinopril 10 Mg Tabs (Lisinopril) .... Take 1 tab by mouth daily 10)  Aldactone 25 Mg Tabs (Spironolactone) .... Take 1 tablet by mouth once a day 11)  Klonopin 1 Mg Tabs (Clonazepam) .... Take 1 tab by mouth at bedtime 12)  Hydrocodone-acetaminophen 5-500 Mg Tabs (Hydrocodone-acetaminophen) .Marland Kitchen.. 1 tablets by mouth q 6 hours as needed severe pain 13)  Zocor 40 Mg Tabs (Simvastatin) .... Take 1 tablet by mouth once a day 14)  Lidoderm 5 % Ptch (Lidocaine) .Marland Kitchen.. 1-3 every 12 hours 15)  Proventil Hfa 108 (90 Base) Mcg/act Aers (Albuterol sulfate) .... Two puffs every four hours as needed wheezing 16)  Humulin N 100 Unit/ml Susp (Insulin isophane human) .... 30 units subcutaneously q am 17)  Lac-hydrin 12 % Lotn (Ammonium lactate) .... Apply every morning to affected area 18)  Gabapentin 600 Mg Tabs (Gabapentin) .... One by mouth three times a day for neuropathy 19)  Amlodipine Besylate 10 Mg Tabs (Amlodipine besylate) .... One by mouth daily 20)  Flovent Hfa 220 Mcg/act Aero (Fluticasone propionate  hfa) .... Two puffs daily - use every day even if you are not wheezing  Other Orders: Mammogram (Screening)  (Mammo)  Patient Instructions: 1)  You got a flu shot today. 2)  The new inhalor, flovent, should be used every day - even if you are not wheezing. 3)  Continue to use the albuterol inhalor when you are short of breath. 4)  I increased the dose of your gabapentin for the neuropathy.  Hopefully that will help your leg pain. 5)  Please make sure to get your mammogram. Prescriptions: GABAPENTIN 600 MG TABS (GABAPENTIN) one by mouth three times a day for neuropathy  #90 x 12   Entered and Authorized by:   Madison Hickman MD   Signed by:   Madison Hickman MD on 04/25/2009   Method used:   Print then Give to Patient   RxID:   [id number] PROVENTIL HFA 108 (90 BASE) MCG/ACT AERS (ALBUTEROL SULFATE) Two puffs every four hours as needed wheezing  #1 x 12   Entered and Authorized by:   Madison Hickman MD   Signed by:   Madison Hickman MD on 04/25/2009   Method used:   Print then Give to Patient   RxID:   [id number] FLOVENT HFA 220 MCG/ACT AERO (FLUTICASONE PROPIONATE  HFA) two puffs daily - use every day even if you are not wheezing  #1 x 2   Entered and Authorized by:   Madison Hickman MD   Signed by:   Madison Hickman MD on 04/25/2009   Method used:   Print then Give to Patient   RxID:   [id number]    Prevention & Chronic Care Immunizations   Influenza vaccine: Not documented    Tetanus booster: 08/05/2005: Historical    Pneumococcal vaccine: Pneumovax  (02/26/2009)    H. zoster vaccine: Not documented  Colorectal Screening   Hemoccult: Not documented   Hemoccult action/deferral: Not indicated  (04/25/2009)    Colonoscopy: Results: Normal.   (07/05/2002)   Colonoscopy due: 07/05/2012  Other Screening   Pap smear: Not documented   Pap smear action/deferral: Not indicated-other  (01/28/2009)    Mammogram: No specific mammographic evidence of malignancy.  Assessment: BIRADS 1.   (05/09/2007)   Mammogram action/deferral: Ordered  (04/25/2009)    DXA bone density  scan: Not documented   Smoking status: never  (02/26/2009)  Diabetes Mellitus   HgbA1C: 6.4  (04/25/2009)  HgbA1C action/deferral: Ordered  (04/25/2009)    Eye exam: Not documented    Foot exam: yes  (04/25/2009)   Foot exam action/deferral: Do today   High risk foot: Not documented   Foot care education: Done  (04/25/2009)    Urine microalbumin/creatinine ratio: Not documented   Urine microalbumin action/deferral: Not indicated    Diabetes flowsheet reviewed?: Yes   Progress toward A1C goal: At goal  Lipids   Total Cholesterol: 112  (01/25/2007)   Lipid panel action/deferral: Not indicated   LDL: 55  (01/25/2007)   LDL Direct: 60  (01/15/2009)   HDL: 40  (01/25/2007)   Triglycerides: 86  (01/25/2007)    SGOT (AST): 21  (01/15/2009)   SGPT (ALT): 15  (01/15/2009)   Alkaline phosphatase: 122  (01/15/2009)   Total bilirubin: 0.3  (01/15/2009)    Lipid flowsheet reviewed?: Yes   Progress toward LDL goal: At goal  Hypertension   Last Blood Pressure: 145 / 77  (04/25/2009)   Serum creatinine: 1.28  (01/15/2009)   Serum potassium 4.7  (01/15/2009)    Hypertension flowsheet reviewed?: Yes   Progress toward BP goal: Deteriorated  Self-Management Support :   Personal Goals (by the next clinic visit) :     Personal A1C goal: 7  (02/26/2009)     Personal blood pressure goal: 130/80  (02/26/2009)     Personal LDL goal: 100  (02/26/2009)    Diabetes self-management support: Written self-care plan  (04/25/2009)   Diabetes care plan printed    Hypertension self-management support: Written self-care plan  (04/25/2009)   Hypertension self-care plan printed.    Lipid self-management support: Written self-care plan  (04/25/2009)   Lipid self-care plan printed.   Nursing Instructions: Give Flu vaccine today Schedule screening mammogram (see order) HgbA1C today (see order) Diabetic foot exam today   Laboratory Results   Blood Tests   Date/Time Received: April 25, 2009 2:12 PM  Date/Time Reported: April 25, 2009 2:39 PM   HGBA1C: 6.4%   (Normal Range: Non-Diabetic - 3-6%   Control Diabetic - 6-8%)  Comments: ...........test performed by...........Marland KitchenAudelia Hives, CMA entered by Hedy Camara, [place]         Diabetic Foot Exam Foot Inspection Is there a history of a foot ulcer?              No Is there a foot ulcer now?              No Can the patient see the bottom of their feet?          Yes Are the shoes appropriate in style and fit?          Yes Is there swelling or an abnormal foot shape?          No Are the toenails long?                No Are the toenails thick?                No Are the toenails ingrown?              No Is there heavy callous build-up?              No Is there pain in the calf muscle (Intermittent claudication) when walking?    NoIs there a claw toe deformity?              No Is there elevated skin temperature?  No Is there limited ankle dorsiflexion?            No Is there foot or ankle muscle weakness?            No  Diabetic Foot Care Education Patient educated on appropriate care of diabetic feet.  Pulse Check          Right Foot          Left Foot Posterior Tibial:        normal            normal Dorsalis Pedis:        normal            normal    10-g (5.07) Semmes-Weinstein Monofilament Test           Right Foot          Left Foot Test Control      normal         normal Site 1         abnormal         abnormal Site 2         abnormal         abnormal Site 3         abnormal         abnormal Site 4         normal         normal Site 5         normal         normal Site 6         normal         normal Site 7         normal         normal Site 8         normal         normal Site 9         normal         normal Site 10         normal         normal  Impression      abnormal         abnormal

## 2010-08-06 NOTE — Assessment & Plan Note (Signed)
Summary: F/U VISIT/[facility]   Vital Signs:  Patient profile:   70 year old female Weight:      296 pounds Temp:     98.5 degrees F oral Pulse rate:   65 / minute Pulse rhythm:   regular BP sitting:   128 / 74  (left arm) Cuff size:   large  Vitals Entered By: Audelia Hives CMA (May 08, 2010 10:41 AM) CC: follow-up visit   CC:  follow-up visit.  History of Present Illness: Rash on face Rash on buttocks crease unresponsive to anitfungals alone. BP good control DM slight worsening Wt down 3 lbs.  Habits & Providers  Alcohol-Tobacco-Diet     Tobacco Status: never  Exercise-Depression-Behavior     Have you felt down or hopeless? no     Have you felt little pleasure in things? no     Depression Counseling: not indicated; screening negative for depression  Current Medications (verified): 1)  Metoclopramide Hcl 10 Mg  Tabs (Metoclopramide Hcl) .... Take 1 Tablet By Mouth Four Times A Day 2)  Nexium 40 Mg  Cpdr (Esomeprazole Magnesium) .... Take 1 Tablet By Mouth Once A Day 3)  Synthroid 75 Mcg  Tabs (Levothyroxine Sodium) .... Take 1 Tablet By Mouth Once A Day 4)  Imdur 60 Mg  Tb24 (Isosorbide Mononitrate) .... Take 1 Tablet By Mouth Once A Day 5)  Lexapro 20 Mg  Tabs (Escitalopram Oxalate) .... Take 1 Tablet By Mouth Once A Day 6)  Aspirin Ec 325 Mg  Tbec (Aspirin) .... Take 1 Tablet By Mouth Once A Day 7)  Carvedilol 6.25 Mg Tabs (Carvedilol) .... One By Mouth Two Times A Day 8)  Aldactone 25 Mg  Tabs (Spironolactone) .... Take 1 Tablet By Mouth Once A Day 9)  Klonopin 1 Mg  Tabs (Clonazepam) .... Take 1 Tab By Mouth At Bedtime 10)  Tramadol Hcl 50 Mg  Tabs (Tramadol Hcl) .... One Q8h As Needed Pain 11)  Zocor 40 Mg  Tabs (Simvastatin) .... Take 1 Tablet By Mouth Once A Day 12)  Lidoderm 5 %  Ptch (Lidocaine) .Marland Kitchen.. 1-3 Every 12 Hours 13)  Proair Hfa 108 (90 Base) Mcg/act Aers (Albuterol Sulfate) .... 2 Puffs Q4h As Needed Shortness of Breath 14)  Humulin N 100 Unit/ml  Susp  (Insulin Isophane Human) .... 30 Units Subcutaneously Q Am 15)  Lac-Hydrin 12 %  Lotn (Ammonium Lactate) .... Apply Every Morning To Affected Area 16)  Gabapentin 600 Mg Tabs (Gabapentin) .... One By Mouth Three Times A Day For Neuropathy 17)  Amlodipine Besylate 10 Mg Tabs (Amlodipine Besylate) .... One By Mouth Daily 18)  Flovent Hfa 220 Mcg/act Aero (Fluticasone Propionate  Hfa) .... Two Puffs Daily - Use Every Day Even If You Are Not Wheezing 19)  Tussionex Pennkinetic Er 8-10 Mg/59ml Lqcr (Chlorpheniramine-Hydrocodone) .... One Teaspoon By Mouth Two Times A Day As Needed Cough: Disp 4 Oz 20)  Triamcinolone Acetonide 0.025 % Crea (Triamcinolone Acetonide) .... Use Two Times A Day On Face Rash As Needed Disp 15 Gram Tube 21)  Clotrimazole-Betamethasone 1-0.05 % Crea (Clotrimazole-Betamethasone) .... Apply Two Times A Day To Rash On Buttocks.  Disp 60 Gram Tube  Allergies (verified): 1)  ! Pcn 2)  ! * Requip 3)  ! * Lyrica 4)  ! Lipitor (Atorvastatin)  Past History:  Past medical, surgical, family and social histories (including risk factors) reviewed, and no changes noted (except as noted below).  Past Medical History: Reviewed history from 03/07/2007 and  no changes required. Current Problems:  COGNITIVE IMPAIRMENT, MILD, SO STATED (ICD-331.83) RESTLESS LEG SYNDROME (ICD-333.94) CONGESTIVE HEART FAILURE (ICD-428.0) THYROID NODULE, HX OF (ICD-V12.2) OSTEOARTHROSIS, GENERALIZED, MULTIPLE SITES (ICD-715.09) ANEMIA NOS (ICD-285.9) DIABETES MELLITUS, TYPE II (ICD-250.00) RENAL FAILURE (ICD-586) HYPOTHYROIDISM (ICD-244.9) HYPERTENSION (ICD-401.9) GERD (ICD-530.81) DEPRESSION (ICD-311) CORONARY ARTERY DISEASE (ICD-414.00) ASTHMA (ICD-493.90) SLEEP APNEA (ICD-780.57) DYSLIPIDEMIA (ICD-272.4) DM (ICD-250.00)  Past Surgical History: Reviewed history from 03/07/2007 and no changes required. Cholecystectomy Tubal ligation Carpal tunnel release  s/p right knee arthroscopy Renal u/s  08/19/2005  Family History: Reviewed history from 12/20/2008 and no changes required. Family History of CAD Female 1st degree relative- mother Family History Diabetes 1st degree relative- mother  Social History: Reviewed history from 12/20/2008 and no changes required. Lives alone.  She is retired.  Quit smoking cigarettes, does not use alcohol or illicit drugs.    Physical Exam  General:  Well-developed,well-nourished,in no acute distress; alert,appropriate and cooperative throughout examination Skin:  facial rash in T zone consistant with mild seborrhea Buttocks rash in crease, moist.  Looks candidal   Impression & Recommendations:  Problem # 1:  RASH AND OTHER NONSPECIFIC SKIN ERUPTION (ICD-782.1)  actually two problems sebborrhea on face and candida unresponsive to antifungals alone on buttocks.  Also see patient instructions Her updated medication list for this problem includes:    Triamcinolone Acetonide 0.025 % Crea (Triamcinolone acetonide) ..... Use two times a day on face rash as needed disp 15 gram tube    Clotrimazole-betamethasone 1-0.05 % Crea (Clotrimazole-betamethasone) .Marland Kitchen... Apply two times a day to rash on buttocks.  disp 60 gram tube  Orders: [facility]- Est  Level 4 [id number])  Problem # 2:  DIABETES MELLITUS, TYPE II (ICD-250.00) Assessment: Unchanged  Her updated medication list for this problem includes:    Aspirin Ec 325 Mg Tbec (Aspirin) .Marland Kitchen... Take 1 tablet by mouth once a day    Humulin N 100 Unit/ml Susp (Insulin isophane human) .Marland KitchenMarland KitchenMarland KitchenMarland Kitchen 30 units subcutaneously q am  Orders: A1C-[facility] [id number]) [facility]- Est  Level 4 [id number])  Problem # 3:  HYPERTENSION (ICD-401.9) Assessment: Improved  Her updated medication list for this problem includes:    Carvedilol 6.25 Mg Tabs (Carvedilol) ..... One by mouth two times a day    Aldactone 25 Mg Tabs (Spironolactone) .Marland Kitchen... Take 1 tablet by mouth once a day    Amlodipine Besylate 10 Mg Tabs (Amlodipine besylate) ..... One by mouth  daily  Orders: [facility]- Est  Level 4 [id number])  Complete Medication List: 1)  Metoclopramide Hcl 10 Mg Tabs (Metoclopramide hcl) .... Take 1 tablet by mouth four times a day 2)  Nexium 40 Mg Cpdr (Esomeprazole magnesium) .... Take 1 tablet by mouth once a day 3)  Synthroid 75 Mcg Tabs (Levothyroxine sodium) .... Take 1 tablet by mouth once a day 4)  Imdur 60 Mg Tb24 (Isosorbide mononitrate) .... Take 1 tablet by mouth once a day 5)  Lexapro 20 Mg Tabs (Escitalopram oxalate) .... Take 1 tablet by mouth once a day 6)  Aspirin Ec 325 Mg Tbec (Aspirin) .... Take 1 tablet by mouth once a day 7)  Carvedilol 6.25 Mg Tabs (Carvedilol) .... One by mouth two times a day 8)  Aldactone 25 Mg Tabs (Spironolactone) .... Take 1 tablet by mouth once a day 9)  Klonopin 1 Mg Tabs (Clonazepam) .... Take 1 tab by mouth at bedtime 10)  Tramadol Hcl 50 Mg Tabs (Tramadol hcl) .... One q8h as needed pain 11)  Zocor 40 Mg Tabs (Simvastatin) .... Take  1 tablet by mouth once a day 12)  Lidoderm 5 % Ptch (Lidocaine) .Marland Kitchen.. 1-3 every 12 hours 13)  Proair Hfa 108 (90 Base) Mcg/act Aers (Albuterol sulfate) .... 2 puffs q4h as needed shortness of breath 14)  Humulin N 100 Unit/ml Susp (Insulin isophane human) .... 30 units subcutaneously q am 15)  Lac-hydrin 12 % Lotn (Ammonium lactate) .... Apply every morning to affected area 16)  Gabapentin 600 Mg Tabs (Gabapentin) .... One by mouth three times a day for neuropathy 17)  Amlodipine Besylate 10 Mg Tabs (Amlodipine besylate) .... One by mouth daily 18)  Flovent Hfa 220 Mcg/act Aero (Fluticasone propionate  hfa) .... Two puffs daily - use every day even if you are not wheezing 19)  Tussionex Pennkinetic Er 8-10 Mg/98ml Lqcr (Chlorpheniramine-hydrocodone) .... One teaspoon by mouth two times a day as needed cough: disp 4 oz 20)  Triamcinolone Acetonide 0.025 % Crea (Triamcinolone acetonide) .... Use two times a day on face rash as needed disp 15 gram tube 21)   Clotrimazole-betamethasone 1-0.05 % Crea (Clotrimazole-betamethasone) .... Apply two times a day to rash on buttocks.  disp 60 gram tube  Other Orders: Influenza Vaccine MCR [id number]) Mammogram (Screening) (Mammo)  Patient Instructions: 1)  Get 50% baby powder, 50% corn starch at drug store.  Put cream on buttocks first then dust area with powder. Prescriptions: CLOTRIMAZOLE-BETAMETHASONE 1-0.05 % CREA (CLOTRIMAZOLE-BETAMETHASONE) Apply two times a day to rash on buttocks.  Disp 60 gram tube  #60 x 2   Entered and Authorized by:   Madison Hickman MD   Signed by:   Madison Hickman MD on 05/08/2010   Method used:   Electronically to        [id number] (retail)       [place], [place]  [id number]       Ph: [phone]       Fax: [phone]   [id number] TRIAMCINOLONE ACETONIDE 0.025 % CREA (TRIAMCINOLONE ACETONIDE) Use two times a day on face rash as needed Disp 15 gram tube  #15 x 3   Entered and Authorized by:   Madison Hickman MD   Signed by:   Madison Hickman MD on 05/08/2010   Method used:   Electronically to        [facility] (retail)       [place], [place]  [id number]       Ph: [phone]       Fax: [phone]   RxID:   [id number]    Orders Added: 1)  Influenza Vaccine MCR [id number] 2)  A1C-[facility] [83036] 3)  Mammogram (Screening) [Mammo] 4)  [facility]- Est  Level 4 [id number]   Immunizations Administered:  Influenza Vaccine # 1:    Vaccine Type: Fluvax MCR    Site: left deltoid    Mfr: GlaxoSmithKline    Dose: 0.5 ml    Route: IM    Given by: Audelia Hives CMA    Exp. Date: 12/30/2010    Lot #: [id number]    VIS given: 01/27/10 version given May 08, 2010.  Flu Vaccine Consent Questions:    Do you have a history of severe allergic reactions to this vaccine? no    Any prior history of allergic reactions to egg and/or gelatin? no    Do you have a sensitivity to the  preservative Thimersol? no  Do you have a past history of Guillan-Barre Syndrome? no    Do you currently have an acute febrile illness? no    Have you ever had a severe reaction to latex? no    Vaccine information given and explained to patient? yes    Are you currently pregnant? no   Immunizations Administered:  Influenza Vaccine # 1:    Vaccine Type: Fluvax MCR    Site: left deltoid    Mfr: GlaxoSmithKline    Dose: 0.5 ml    Route: IM    Given by: Audelia Hives CMA    Exp. Date: 12/30/2010    Lot #: [id number]    VIS given: 01/27/10 version given May 08, 2010.   Prevention & Chronic Care Immunizations   Influenza vaccine: Fluvax MCR  (05/08/2010)   Influenza vaccine due: 03/06/2011    Tetanus booster: 08/05/2005: Historical    Pneumococcal vaccine: Pneumovax  (02/26/2009)    H. zoster vaccine: Not documented  Colorectal Screening   Hemoccult: Not documented   Hemoccult action/deferral: Not indicated  (04/25/2009)    Colonoscopy: Results: Normal.   (07/05/2002)   Colonoscopy due: 07/05/2012  Other Screening   Pap smear: Not documented   Pap smear action/deferral: Not indicated-other  (01/28/2009)    Mammogram: ASSESSMENT: Negative - BI-RADS 1^MM DIGITAL SCREENING  (05/07/2009)   Mammogram action/deferral: Ordered  (05/08/2010)   Mammogram due: 05/07/2010    DXA bone density scan: Not documented   Smoking status: never  (05/08/2010)  Diabetes Mellitus   HgbA1C: 7.4  (05/08/2010)   HgbA1C action/deferral: Ordered  (05/08/2010)    Eye exam: normal  (11/04/2009)   Eye exam due: 11/2010    Foot exam: yes  (10/17/2009)   Foot exam action/deferral: Do today   High risk foot: Not documented   Foot care education: Done  (04/25/2009)    Urine microalbumin/creatinine ratio: Not documented   Urine microalbumin action/deferral: Not indicated    Diabetes flowsheet reviewed?: Yes   Progress toward A1C goal: At goal  Lipids   Total Cholesterol: 117   (10/03/2009)   Lipid panel action/deferral: Not indicated   LDL: 69  (10/03/2009)   LDL Direct: 60  (01/15/2009)   HDL: 36  (10/03/2009)   Triglycerides: 58  (10/03/2009)    SGOT (AST): 20  (10/03/2009)   SGPT (ALT): 14  (10/03/2009)   Alkaline phosphatase: 113  (10/03/2009)   Total bilirubin: 0.4  (10/03/2009)    Lipid flowsheet reviewed?: Yes   Progress toward LDL goal: At goal  Hypertension   Last Blood Pressure: 128 / 74  (05/08/2010)   Serum creatinine: 1.64  (10/03/2009)   Serum potassium 4.9  (10/03/2009)    Hypertension flowsheet reviewed?: Yes   Progress toward BP goal: At goal  Self-Management Support :   Personal Goals (by the next clinic visit) :     Personal A1C goal: 7  (02/26/2009)     Personal blood pressure goal: 130/80  (02/26/2009)     Personal LDL goal: 100  (02/26/2009)    Diabetes self-management support: Written self-care plan  (05/08/2010)   Diabetes care plan printed    Hypertension self-management support: Written self-care plan  (05/08/2010)   Hypertension self-care plan printed.    Lipid self-management support: Written self-care plan  (05/08/2010)   Lipid self-care plan printed.   Nursing Instructions: Schedule screening mammogram (see order) HgbA1C today (see order)    Laboratory Results   Blood Tests   Date/Time Received: May 08, 2010  11:30 AM  Date/Time Reported: May 08, 2010 11:38 AM   HGBA1C: 7.4%   (Normal Range: Non-Diabetic - 3-6%   Control Diabetic - 6-8%)  Comments: ...............test performed by......Marland KitchenBonnie A. [name], MLS (ASCP)cm

## 2010-08-06 NOTE — Assessment & Plan Note (Signed)
Summary: f/u last visit/eo   Vital Signs:  Patient profile:   70 year old female Height:      63 inches Weight:      286.44 pounds BMI:     50.92 Temp:     97.8 degrees F oral Pulse rate:   67 / minute Pulse rhythm:   regular BP sitting:   129 / 74  (left arm)  Vitals Entered By: Janeth Rase LPN (August 25, 624THL 1:32 PM) CC: Check up. Is Patient Diabetic? No Pain Assessment Patient in pain? no        CC:  Check up.Brittany Roberts  History of Present Illness: C/O decreased visual acuity.  Recently saw eye doctor.  New glasses which seem to have made the problem worse.  Eyes water for 5 years.  He told her she has eye allergies.  No medicine prescribed.  Feels like urine has strong.  Denies urgency, frequency dysuria or fever.  Also denies pain   Brought in all meds.  On review, I was surprized that she is not on an ACE.    DM, HBP and Chol - see [facility] forms.  At goal in all areas.  She states she has had mammogram since 11/08 - my last listed time.  Will ask nurse to check with radiology.  Habits & Providers  Alcohol-Tobacco-Diet     Tobacco Status: never  Current Medications (verified): 1)  Metoclopramide Hcl 10 Mg  Tabs (Metoclopramide Hcl) .... Take 1 Tablet By Mouth Four Times A Day 2)  Nexium 40 Mg  Cpdr (Esomeprazole Magnesium) .... Take 1 Tablet By Mouth Once A Day 3)  Synthroid 75 Mcg  Tabs (Levothyroxine Sodium) .... Take 1 Tablet By Mouth Once A Day 4)  Fexofenadine Hcl 180 Mg  Tabs (Fexofenadine Hcl) .... Take 1 Tablet By Mouth Once A Day 5)  Imdur 60 Mg  Tb24 (Isosorbide Mononitrate) .... Take 1 Tablet By Mouth Once A Day 6)  Lexapro 20 Mg  Tabs (Escitalopram Oxalate) .... Take 1 Tablet By Mouth Once A Day 7)  Aspirin Ec 325 Mg  Tbec (Aspirin) .... Take 1 Tablet By Mouth Once A Day 8)  Coreg 3.125 Mg  Tabs (Carvedilol) .... Take 1 Tablet By Mouth Two Times A Day 9)  Lisinopril 10 Mg  Tabs (Lisinopril) .... Take 1 Tab By Mouth Daily 10)  Aldactone 25 Mg  Tabs  (Spironolactone) .... Take 1 Tablet By Mouth Once A Day 11)  Klonopin 1 Mg  Tabs (Clonazepam) .... Take 1 Tab By Mouth At Bedtime 12)  Tramadol Hcl 50 Mg  Tabs (Tramadol Hcl) .... Take 1 Tablet Every 8 Hrs or When Needed 13)  Hydrocodone-Acetaminophen 5-500 Mg  Tabs (Hydrocodone-Acetaminophen) .Brittany Roberts.. 1 Tablets By Mouth Q 6 Hours As Needed Severe Pain 14)  Zocor 40 Mg  Tabs (Simvastatin) .... Take 1 Tablet By Mouth Once A Day 15)  Lidoderm 5 %  Ptch (Lidocaine) .Brittany Roberts.. 1-3 Every 12 Hours 16)  Albuterol Mdi .... Two Puffs Every 4-6 Hours or When Needed 17)  Humulin N 100 Unit/ml  Susp (Insulin Isophane Human) .... 30 Units Subcutaneously Q Am 18)  Lac-Hydrin 12 %  Lotn (Ammonium Lactate) .... Apply Every Morning To Affected Area 19)  Gabapentin 300 Mg  Caps (Gabapentin) .... One By Mouth Three Times A Day  Allergies (verified): 1)  ! Pcn 2)  ! * Requip 3)  ! * Lyrica 4)  ! Lipitor (Atorvastatin)  Social History: Smoking Status:  never  Physical  Exam  General:  Well-developed,well-nourished,in no acute distress; alert,appropriate and cooperative throughout examination Lungs:  Normal respiratory effort, chest expands symmetrically. Lungs are clear to auscultation, no crackles or wheezes. Heart:  Normal rate and regular rhythm. S1 and S2 normal without gallop, murmur, click, rub or other extra sounds.   Impression & Recommendations:  Problem # 1:  DYSURIA (ICD-788.1) No UTI Orders: Urinalysis-[facility] (00000) [place]- Est  Level 4 [id number])  Problem # 2:  DIABETES MELLITUS, TYPE II (ICD-250.00) Assessment: Improved  Start ACE and restart ASA Her updated medication list for this problem includes:    Aspirin Ec 325 Mg Tbec (Aspirin) .Brittany Roberts... Take 1 tablet by mouth once a day    Lisinopril 10 Mg Tabs (Lisinopril) .Brittany Roberts... Take 1 tab by mouth daily    Humulin N 100 Unit/ml Susp (Insulin isophane human) .Brittany KitchenMarland KitchenMarland KitchenMarland Roberts 30 units subcutaneously q am  Orders: [facility]- Est  Level 4 [id number])  Problem # 3:  DYSLIPIDEMIA  (ICD-272.4) Assessment: Improved  Chol at goa Her updated medication list for this problem includes:    Zocor 40 Mg Tabs (Simvastatin) .Brittany Roberts... Take 1 tablet by mouth once a day  Orders: [place]- Est  Level 4 [id number])  Problem # 4:  HYPERTENSION (ICD-401.9) Assessment: Improved  Change norvasc to lisinopril Her updated medication list for this problem includes:    Coreg 3.125 Mg Tabs (Carvedilol) .Brittany Roberts... Take 1 tablet by mouth two times a day    Lisinopril 10 Mg Tabs (Lisinopril) .Brittany Roberts... Take 1 tab by mouth daily    Aldactone 25 Mg Tabs (Spironolactone) .Brittany Roberts... Take 1 tablet by mouth once a day  Orders: [place]- Est  Level 4 [id number])  Complete Medication List: 1)  Metoclopramide Hcl 10 Mg Tabs (Metoclopramide hcl) .... Take 1 tablet by mouth four times a day 2)  Nexium 40 Mg Cpdr (Esomeprazole magnesium) .... Take 1 tablet by mouth once a day 3)  Synthroid 75 Mcg Tabs (Levothyroxine sodium) .... Take 1 tablet by mouth once a day 4)  Fexofenadine Hcl 180 Mg Tabs (Fexofenadine hcl) .... Take 1 tablet by mouth once a day 5)  Imdur 60 Mg Tb24 (Isosorbide mononitrate) .... Take 1 tablet by mouth once a day 6)  Lexapro 20 Mg Tabs (Escitalopram oxalate) .... Take 1 tablet by mouth once a day 7)  Aspirin Ec 325 Mg Tbec (Aspirin) .... Take 1 tablet by mouth once a day 8)  Coreg 3.125 Mg Tabs (Carvedilol) .... Take 1 tablet by mouth two times a day 9)  Lisinopril 10 Mg Tabs (Lisinopril) .... Take 1 tab by mouth daily 10)  Aldactone 25 Mg Tabs (Spironolactone) .... Take 1 tablet by mouth once a day 11)  Klonopin 1 Mg Tabs (Clonazepam) .... Take 1 tab by mouth at bedtime 12)  Tramadol Hcl 50 Mg Tabs (Tramadol hcl) .... Take 1 tablet every 8 hrs or when needed 13)  Hydrocodone-acetaminophen 5-500 Mg Tabs (Hydrocodone-acetaminophen) .Brittany Roberts.. 1 tablets by mouth q 6 hours as needed severe pain 14)  Zocor 40 Mg Tabs (Simvastatin) .... Take 1 tablet by mouth once a day 15)  Lidoderm 5 % Ptch (Lidocaine) .Brittany Roberts.. 1-3 every  12 hours 16)  Albuterol Mdi  .... Two puffs every 4-6 hours or when needed 17)  Humulin N 100 Unit/ml Susp (Insulin isophane human) .... 30 units subcutaneously q am 18)  Lac-hydrin 12 % Lotn (Ammonium lactate) .... Apply every morning to affected area 19)  Gabapentin 300 Mg Caps (Gabapentin) .... One by mouth three times a day  Other Orders: Pneumococcal Vaccine [phone]) Admin 1st Vaccine [phone]) Admin 1st Vaccine [facility]) [phone])  Patient Instructions: 1)  There are refills to several meds at the pharmacy. 2)  Please start taking one aspirin a day.  It can be a regualr strength aspirin or a baby aspirin.  3)  The other medicine change is to Stop the Norvasc(amlodipine) and start the new blood pressure medicine - lisinopril  Prescriptions: LISINOPRIL 10 MG  TABS (LISINOPRIL) Take 1 tab by mouth daily  #90 x 3   Entered and Authorized by:   Madison Hickman MD   Signed by:   Madison Hickman MD on 02/26/2009   Method used:   Electronically to        [facility]. [id number]* (retail)       [address]       [place], [place]  [address]       Ph: [phone] or [phone]       Fax: [phone]   RxID:   [id number] HYDROCODONE-ACETAMINOPHEN 5-500 MG  TABS (HYDROCODONE-ACETAMINOPHEN) 1 tablets by mouth q 6 hours as needed severe pain  #60 x 3   Entered and Authorized by:   Madison Hickman MD   Signed by:   Madison Hickman MD on 02/26/2009   Method used:   Handwritten   RxID:   [id number] TRAMADOL HCL 50 MG  TABS (TRAMADOL HCL) Take 1 tablet every 8 hrs or when needed  #90 x 3   Entered and Authorized by:   Madison Hickman MD   Signed by:   Madison Hickman MD on 02/26/2009   Method used:   Handwritten   RxID:   [id number] KLONOPIN 1 MG  TABS (CLONAZEPAM) Take 1 tab by mouth at bedtime  #30 x 3   Entered and Authorized by:   Madison Hickman MD   Signed by:   Madison Hickman MD on 02/26/2009   Method used:   Handwritten   RxID:    [id number] NEXIUM 40 MG  CPDR (ESOMEPRAZOLE MAGNESIUM) Take 1 tablet by mouth once a day  #30 x 12   Entered and Authorized by:   Madison Hickman MD   Signed by:   Madison Hickman MD on 02/26/2009   Method used:   Electronically to        [facility]. [id number]* (retail)       [address]       [place], [place]  [address]       Ph: [phone] or [phone]       Fax: [phone]   RxID:   [id number] METOCLOPRAMIDE HCL 10 MG  TABS (METOCLOPRAMIDE HCL) Take 1 tablet by mouth four times a day  #120 x 12   Entered and Authorized by:   Madison Hickman MD   Signed by:   Madison Hickman MD on 02/26/2009   Method used:   Electronically to        [facility]. [id number]* (retail)       [address]       [place], [place]  [address]       Ph: [phone] or [phone]       Fax: [phone]   RxID:   [id number]    Prevention & Chronic Care Immunizations  Influenza vaccine: Not documented    Tetanus booster: 08/05/2005: Historical    Pneumococcal vaccine: Pneumovax  (02/26/2009)    H. zoster vaccine: Not documented  Colorectal Screening   Hemoccult: Not documented    Colonoscopy: Results: Normal.   (07/05/2002)  Other Screening   Pap smear: Not documented   Pap smear action/deferral: Not indicated-other  (01/28/2009)    Mammogram: No specific mammographic evidence of malignancy.  Assessment: BIRADS 1.   (05/09/2007)    DXA bone density scan: Not documented   Smoking status: never  (02/26/2009)  Diabetes Mellitus   HgbA1C: 6.6  (01/15/2009)    Eye exam: Not documented    Foot exam: Not documented   High risk foot: Not documented   Foot care education: Not documented    Urine microalbumin/creatinine ratio: Not documented   Urine microalbumin action/deferral: Not indicated    Diabetes flowsheet reviewed?: Yes   Progress toward A1C goal: At goal  Lipids   Total Cholesterol:  112  (01/25/2007)   Lipid panel action/deferral: Not indicated   LDL: 55  (01/25/2007)   LDL Direct: 60  (01/15/2009)   HDL: 40  (01/25/2007)   Triglycerides: 86  (01/25/2007)    SGOT (AST): 21  (01/15/2009)   SGPT (ALT): 15  (01/15/2009)   Alkaline phosphatase: 122  (01/15/2009)   Total bilirubin: 0.3  (01/15/2009)    Lipid flowsheet reviewed?: Yes   Progress toward LDL goal: At goal  Hypertension   Last Blood Pressure: 129 / 74  (02/26/2009)   Serum creatinine: 1.28  (01/15/2009)   Serum potassium 4.7  (01/15/2009)    Hypertension flowsheet reviewed?: Yes   Progress toward BP goal: At goal  Self-Management Support :   Personal Goals (by the next clinic visit) :     Personal A1C goal: 7  (02/26/2009)     Personal blood pressure goal: 130/80  (02/26/2009)     Personal LDL goal: 100  (02/26/2009)    Diabetes self-management support: Written self-care plan  (02/26/2009)   Diabetes care plan printed    Hypertension self-management support: Written self-care plan  (02/26/2009)   Hypertension self-care plan printed.    Lipid self-management support: Written self-care plan  (02/26/2009)   Lipid self-care plan printed.   Nursing Instructions: Give Pneumovax today     Pneumovax Vaccine    Vaccine Type: Pneumovax    Site: right deltoid    Mfr: Merck    Dose: 0.5 ml    Route: IM    Given by: Janeth Rase LPN    Exp. Date: 02/11/2010    Lot #: [id number]    VIS given: 01/31/96 version given February 26, 2009.   Laboratory Results   Urine Tests  Date/Time Received: February 26, 2009 2:13 PM  Date/Time Reported: February 26, 2009 2:26 PM   Routine Urinalysis   Color: yellow Appearance: Clear Glucose: negative   (Normal Range: Negative) Bilirubin: negative   (Normal Range: Negative) Ketone: negative   (Normal Range: Negative) Spec. Gravity: >=1.030   (Normal Range: 1.003-1.035) Blood: negative   (Normal Range: Negative) pH: 5.5   (Normal Range: 5.0-8.0) Protein: 100    (Normal Range: Negative) Urobilinogen: 0.2   (Normal Range: 0-1) Nitrite: negative   (Normal Range: Negative) Leukocyte Esterace: negative   (Normal Range: Negative)    Comments: ...........test performed by...........Brittany KitchenHedy Camara, CMA     Appended Document: microscopic results  Laboratory Results   Urine Tests  Date/Time Received: February 26, 2009 2:13 PM  Date/Time Reported: February 26, 2009 2:57 PM   Urine Microscopic WBC/HPF: 1-5 RBC/HPF: rare Bacteria/HPF: 3+ Mucous/HPF: 1+ Epithelial/HPF: 5-10 Yeast/HPF: many    Comments: ...........test performed by...........Brittany KitchenHedy Camara, CMA

## 2010-08-12 ENCOUNTER — Other Ambulatory Visit: Payer: Self-pay | Admitting: Family Medicine

## 2010-08-12 NOTE — Telephone Encounter (Signed)
Refill request

## 2010-08-18 ENCOUNTER — Telehealth: Payer: Self-pay | Admitting: Family Medicine

## 2010-08-18 NOTE — Telephone Encounter (Signed)
Called and discussed.  Last seen on 11/4 and given lotrisone for buttocks rash.  States that it did not help.  Also CO cough and symptoms of UTI.  Told she needs an appointment.  She will call and make it.

## 2010-08-24 ENCOUNTER — Other Ambulatory Visit: Payer: Self-pay | Admitting: Family Medicine

## 2010-08-25 NOTE — Telephone Encounter (Signed)
Refill request

## 2010-09-04 ENCOUNTER — Ambulatory Visit (INDEPENDENT_AMBULATORY_CARE_PROVIDER_SITE_OTHER): Payer: PRIVATE HEALTH INSURANCE | Admitting: Family Medicine

## 2010-09-04 ENCOUNTER — Encounter: Payer: Self-pay | Admitting: Family Medicine

## 2010-09-04 VITALS — BP 130/74 | HR 82 | Temp 98.4°F | Wt 298.6 lb

## 2010-09-04 DIAGNOSIS — L0231 Cutaneous abscess of buttock: Secondary | ICD-10-CM | POA: Insufficient documentation

## 2010-09-04 DIAGNOSIS — R21 Rash and other nonspecific skin eruption: Secondary | ICD-10-CM

## 2010-09-04 DIAGNOSIS — E119 Type 2 diabetes mellitus without complications: Secondary | ICD-10-CM

## 2010-09-04 DIAGNOSIS — H6120 Impacted cerumen, unspecified ear: Secondary | ICD-10-CM

## 2010-09-04 DIAGNOSIS — L03317 Cellulitis of buttock: Secondary | ICD-10-CM

## 2010-09-04 DIAGNOSIS — I10 Essential (primary) hypertension: Secondary | ICD-10-CM

## 2010-09-04 LAB — CONVERTED CEMR LAB
ALT: 16 units/L (ref 0–35)
AST: 24 units/L (ref 0–37)
Albumin: 4 g/dL (ref 3.5–5.2)
Alkaline Phosphatase: 96 units/L (ref 39–117)
BUN: 15 mg/dL (ref 6–23)
CO2: 24 meq/L (ref 19–32)
Calcium: 8.6 mg/dL (ref 8.4–10.5)
Chloride: 103 meq/L (ref 96–112)
Creatinine, Ser: 1.31 mg/dL — ABNORMAL HIGH (ref 0.40–1.20)
Glucose, Bld: 175 mg/dL — ABNORMAL HIGH (ref 70–99)
Potassium: 4.3 meq/L (ref 3.5–5.3)
Sodium: 138 meq/L (ref 135–145)
Total Bilirubin: 0.5 mg/dL (ref 0.3–1.2)
Total Protein: 8.5 g/dL — ABNORMAL HIGH (ref 6.0–8.3)

## 2010-09-04 LAB — COMPREHENSIVE METABOLIC PANEL
ALT: 16 U/L (ref 0–35)
AST: 24 U/L (ref 0–37)
Albumin: 4 g/dL (ref 3.5–5.2)
Alkaline Phosphatase: 96 U/L (ref 39–117)
BUN: 15 mg/dL (ref 6–23)
CO2: 24 mEq/L (ref 19–32)
Calcium: 8.6 mg/dL (ref 8.4–10.5)
Chloride: 103 mEq/L (ref 96–112)
Creat: 1.31 mg/dL — ABNORMAL HIGH (ref 0.40–1.20)
Glucose, Bld: 175 mg/dL — ABNORMAL HIGH (ref 70–99)
Potassium: 4.3 mEq/L (ref 3.5–5.3)
Sodium: 138 mEq/L (ref 135–145)
Total Bilirubin: 0.5 mg/dL (ref 0.3–1.2)
Total Protein: 8.5 g/dL — ABNORMAL HIGH (ref 6.0–8.3)

## 2010-09-04 LAB — POCT GLYCOSYLATED HEMOGLOBIN (HGB A1C): Hemoglobin A1C: 6.7

## 2010-09-04 MED ORDER — DOXYCYCLINE HYCLATE 100 MG PO TABS: 100.0000 mg | ORAL_TABLET | Freq: Two times a day (BID) | ORAL | Status: AC

## 2010-09-04 NOTE — Assessment & Plan Note (Signed)
Doxy x 3 weeks given chronicity of problem.

## 2010-09-04 NOTE — Assessment & Plan Note (Signed)
Well controled. 

## 2010-09-04 NOTE — Progress Notes (Signed)
  Subjective:    Patient ID: Brittany Roberts, female    DOB: 10/27/1940, 70 y.o.   MRN: [id number]  HPI Lt ear pain for 2 months.  Hearing and balance OK Buttocks rash/boils continue.  No fever.  No stool changes    Review of Systems     Objective:   Physical Exam Lt ear cerumen impaction irrigated clear.  Ear pain resolved Buttoks has small area of cellulitis/draining shallow abcess.  Well away for rectum.  Not a fistula       Assessment & Plan:

## 2010-09-04 NOTE — Assessment & Plan Note (Signed)
Check A1C 

## 2010-09-15 ENCOUNTER — Encounter: Payer: Self-pay | Admitting: Home Health Services

## 2010-10-14 ENCOUNTER — Other Ambulatory Visit: Payer: Self-pay | Admitting: Family Medicine

## 2010-10-14 NOTE — Telephone Encounter (Signed)
Refill request

## 2010-10-15 ENCOUNTER — Encounter: Payer: Self-pay | Admitting: Home Health Services

## 2010-10-15 ENCOUNTER — Ambulatory Visit (INDEPENDENT_AMBULATORY_CARE_PROVIDER_SITE_OTHER): Payer: PRIVATE HEALTH INSURANCE | Admitting: Home Health Services

## 2010-10-15 VITALS — BP 142/79 | HR 73 | Temp 98.4°F | Ht 64.5 in | Wt 301.8 lb

## 2010-10-15 DIAGNOSIS — Z0000 Encounter for general adult medical examination without abnormal findings: Secondary | ICD-10-CM

## 2010-10-15 NOTE — Patient Instructions (Signed)
1. Try to eat 4-5 vegetables a day. 2. Remember to do your chair exercises during the [employer] everyday. 3. Call your pharmacy about getting the shingles vaccine. 4. Schedule a bone density test at [facility]. If you would like you can schedule an appointment with the dietician here at the [facility].

## 2010-10-15 NOTE — Progress Notes (Signed)
Patient here for annual wellness visit, patient reports: Risk Factors/Conditions needing evaluation or treatment: Patient is currently seeing PCP for multiple chronic conditions. Patient expressed concerned about her asthma and her weight. Home Safety:  Patient lives with daughter in 1 story apartment.  Patient reports having smoke detectors and adaptive equipment in the bathrooms.  Other Information: Corrective lens: Patient reports wearing corrective lens for reading and visits eye doctor annually. Dentures: Patient has full dentures that are currently broken and visits dentist as needed.  Memory: Patient reports some memory loss. Hearing:  Patient failed hearing at [facility], may be due to cognitive impairment.  Patient does report some hearing loss.   Balance max value patientvalue  Sitting balance 1 1  Arise 2 1  Attempts to arise 2 1  Immediate standing balance 2 1  Standing balance 1 1  Nudge 2 2  Eyes closed 1 1  360 degree turn 1 1  Sitting down 2 1   Gait max value patient value  Initiation of gait 1 1  Step length-left 1 1  Step length-right 1 1  Step height-left 1 0  Step height-right 1 0  Step symmetry 1 1  Step continuity 1 1  Path 2 2  Trunk 2 1  Walking stance 1 1   Balance/Gait Score: 19/26    Annual Wellness Visit Requirements Recorded Today In  Medical, family, social history Past Medical, Family, Social History Section  Current providers Care team  Current medications Medications  Wt, BP, Ht, BMI Vital signs  Hearing assessment (welcome visit) Hearing/Vision  Tobacco, alcohol, illicit drug use History  ADL Nurse Assessment  Depression Screening Nurse Assessment  Cognitive impairment/Mini Mental Status Nurse Assessment/ Flowsheet  Fall Risk Nurse Assessment  Home Safety Progress Note  End of Life Planning (welcome visit) Social Documentation  Medicare preventative services Progress Note  Risk factors/conditions needing evaluation/treatment Progress  Note  Personalized health advice Patient Instructions, goals, letter  Diet & Exercise Social Documentation  Emergency Contact Social Documentation  Seat Belts Social Documentation  Sun exposure/protection Social Documentation    Prevention Plan: Recommended patient schedule a bone density screening and shingles vaccine.  Recommended Medicare Prevention Screenings Women over 91 Test For Frequency Date of Last- BOLD if needed  Breast Cancer 1-2 yrs 12/11  Cervical Cancer 1-3 yrs Not indicated  Colorectal Cancer 1-10 yrs 1/04  Osteoporosis once recommended  Cholesterol 5 yrs 4/11  Diabetes yearly 11/11  HIV yearly declined  Influenza Shot yearly 11/11  Pneumonia Shot once 8/10  Zostavax Shot once recommended

## 2010-10-16 ENCOUNTER — Encounter: Payer: Self-pay | Admitting: Home Health Services

## 2010-10-16 NOTE — Progress Notes (Signed)
  Subjective:    Patient ID: Brittany Roberts, female    DOB: Nov 12, 1940, 70 y.o.   MRN: [id number]  HPI    I have reviewed this visit and discussed with Lamont Dowdy and agree with her documentation.            Review of Systems     Objective:   Physical Exam        Assessment & Plan:

## 2010-10-31 ENCOUNTER — Other Ambulatory Visit: Payer: Self-pay | Admitting: Family Medicine

## 2010-10-31 NOTE — Telephone Encounter (Signed)
Refill request

## 2010-11-02 ENCOUNTER — Other Ambulatory Visit: Payer: Self-pay | Admitting: Family Medicine

## 2010-11-02 ENCOUNTER — Telehealth: Payer: Self-pay | Admitting: Family Medicine

## 2010-11-02 MED ORDER — DOXYCYCLINE HYCLATE 100 MG PO TABS: 100.0000 mg | ORAL_TABLET | Freq: Two times a day (BID) | ORAL | Status: AC

## 2010-11-02 MED ORDER — ALBUTEROL SULFATE HFA 108 (90 BASE) MCG/ACT IN AERS: 2.0000 | INHALATION_SPRAY | RESPIRATORY_TRACT | Status: DC | PRN

## 2010-11-02 MED ORDER — FLUTICASONE PROPIONATE HFA 220 MCG/ACT IN AERO: 1.0000 | INHALATION_SPRAY | Freq: Two times a day (BID) | RESPIRATORY_TRACT | Status: DC

## 2010-11-02 NOTE — Telephone Encounter (Signed)
Pt states that the pharmacy insisted that she call to let doctor know she is out of her ProAir and Flovent - they would not send it to doctor. [facility] [place]  She is also requesting to speak with Dr [name]

## 2010-11-02 NOTE — Telephone Encounter (Signed)
Refill request

## 2010-11-02 NOTE — Telephone Encounter (Signed)
Sent in refills.  Also C/O continued buttocks sore.  Will send in two more weeks of doxy.

## 2010-11-05 ENCOUNTER — Telehealth: Payer: Self-pay | Admitting: Family Medicine

## 2010-11-05 NOTE — Telephone Encounter (Signed)
States she got a letter from [employer] and it told her she needed to get a new meter for her DM.  Accucheck meter called to [employer]

## 2010-11-05 NOTE — Telephone Encounter (Signed)
Called pharmacy and ordered meter.

## 2010-11-09 ENCOUNTER — Other Ambulatory Visit: Payer: Self-pay | Admitting: Family Medicine

## 2010-11-09 NOTE — Telephone Encounter (Signed)
Refill request

## 2010-11-20 NOTE — Op Note (Signed)
NAME:  Brittany Roberts, Brittany Roberts                   ACCOUNT NO.:  [id number]   MEDICAL RECORD NO.:  [id number]          PATIENT TYPE:  AMB   LOCATION:  [facility]                         FACILITY:  [facility]   PHYSICIAN:  Tarri Glenn, M.D.  DATE OF BIRTH:  September 03, 1940   DATE OF PROCEDURE:  07/22/2005  DATE OF DISCHARGE:                                 OPERATIVE REPORT   PREOP DIAGNOSIS:  Left carpal tunnel syndrome, status post right carpal  tunnel release.   POSTOP DIAGNOSIS:  Left carpal tunnel syndrome, status post right carpal  tunnel release.   OPERATION:  Decompression median nerve, left wrist and hand.   SURGEON:  Tarri Glenn, M.D.   ASSISTANT:  Nurse.   ANESTHESIA:  IV regional.   PATHOLOGY AND JUSTIFICATION FOR PROCEDURE:  She has had a successful carpal  tunnel release on the right, has a moderate left carpal tunnel syndrome  based on nerve conduction studies on the left.   PROCEDURE:  Because of the size of her upper arm, we used a forearm  tourniquet and IV regional with the left upper extremity doubly scrubbed,  first with Betadine for additional cleansing of the hand and then DuraPrep.  Left upper extremity was draped in sterile field from tourniquet distally.  I marked out a curved incision along the base of thenar eminence crossing  obliquely over the flexor crease of the wrist in the distal forearm.  The  palmaris longus tendon beneath the median nerve were identified at the  wrist. She had significant compression of the median nerve proximal to the  flexor crease but mainly in the proximal carpal canal with discoloration of  the nerve and some adhesions on it.  She also had a abnormally large and  unusual take off of the motor branch of the nerve in the carpal canal with  branches extending into the mid palm and these identified and protected. I  released the skin and subcutaneous tissue and the fascia into the distal  palm. Potential bleeders were coagulated with bipolar  cautery.  When I felt  that decompression had been completed the wound was irrigated well with  sterile saline. Skin and subcutaneous tissue only closed with interrupted 4-  0 nylon mattress sutures. Betadine, Adaptic dressing and volar plaster  splint were applied. Tourniquet was released. She tolerated the procedure  well was taken to the recovery room in satisfactory condition with no known  complications.           ______________________________  Tarri Glenn, M.D.     JA/MEDQ  D:  07/22/2005  T:  07/22/2005  Job:  [id number]

## 2010-11-20 NOTE — Procedures (Signed)
NAME:  Brittany Roberts, Brittany Roberts                   ACCOUNT NO.:  [id number]   MEDICAL RECORD NO.:  [id number]          PATIENT TYPE:  OUT   LOCATION:  SLEEP CENTER                 FACILITY:  [facility]   PHYSICIAN:  Clinton D. Annamaria Boots, MD, FCCP, FACPDATE OF BIRTH:  Jul 15[birth year] 1939   DATE OF STUDY:                              NOCTURNAL POLYSOMNOGRAM   REFERRING PHYSICIAN:  Dr. Horald Pollen.   DATE OF STUDY:  February 22, 2006.   INDICATION FOR STUDY:  Hypersomnia with sleep apnea.   EPWORTH SLEEPINESS SCORE:  5/24.   BMI:  50.9.   WEIGHT:  288 pounds.   HOME MEDICATION:  Coreg, Aldactone, Klonopin and home oxygen 2 liters per  minute at night.   A diagnostic NPSG on July 14, 2004 had recorded an AHI of 8 per hour.  Split study protocol was requested.   SLEEP ARCHITECTURE:  Short total sleep time 192 minutes with sleep  efficiency 45%.  Stage I was 23%, stage II 77% stages III, IV and REM were  absent.  Sleep latency 69 minutes.  Awake after sleep onset 167 minutes.  She had taken Klonopin at lights out.  She achieved fragmented sleep onset  at around 11 p.m. but could not sustain sleep and was awake on a sustained  basis from roughly 1 to 3 a.m. and again from 3:45-4:45 a.m.  She complained  of sleep disturbance by pain in her right great toe saying that sleep  quality was worse than usual.   RESPIRATORY DATA:  Apnea/hypopnea index, (AHI, RDI) 2.5 obstructive events  per hour which is within normal limits (normal range 0-5 events per hour).  This reflected a total of 1 obstructive apnea and 7 hypopneas.  Most sleep  and all events were recorded while sleeping on left side.  REM AHI N/A.   OXYGEN DATA:  Mild to moderate intermittent snoring with oxygen desaturation  to a nadir of 88%.  Mean oxygen saturation through the study was 93% on room  air.   CARDIAC DATA:  Sinus rhythm with multifocal PVCs and occasional couplets.   MOVEMENTS-PARASOMNIA:  A total of 190 limb jerks were recorded of which  6  were associated with arousal or awakening for a periodic limb movement with  arousal index of 1.9 per hour which is of uncertain significance.   IMPRESSION/RECOMMENDATIONS:  1. Difficulty initiating and maintaining sleep at least partly associated      with a complaint of pain in her great toe.  She had taken clonazepam      before lights out.  2. Occasional sleep disordered breathing events, AHI 2.5 per hour, within      normal limits (0-5 per hour).  These events were mainly noted while      sleeping on left side.  Mild to moderate intermittent snoring with      oxygen desaturation to a nadir of 88%.  3. Specific therapy for sleep disordered breathing is not indicated based      on this study.  Weight loss and treatment for any significant nasal      congestion, and therapy for specific discomforts such as the  painful      toe would be appropriate.  4. Consider evaluating for chronic insomnia with evidence suggesting      habitual use of clonazepam as a sleep aid.  5. Frequent limb jerks.  An association with sleep disturbance could not      be demonstrated on this study night but a therapeutic trial of Requip      or Mirapex might be useful while exploring treatable causes of sleep      disturbance at home.      Clinton D. Annamaria Boots, MD, [facility], [place], Tax adviser of Sleep Medicine  Electronically Signed     CDY/MEDQ  D:  02/26/2006 15:17:11  T:  02/28/2006 00:09:14  Job:  [id number]

## 2010-11-20 NOTE — Discharge Summary (Signed)
NAME:  [facility], [facility]                   ACCOUNT NO.:  [id number]   MEDICAL RECORD NO.:  [id number]          PATIENT TYPE:  INP   LOCATION:  [id number]                         FACILITY:  [facility]   PHYSICIAN:  Belva Crome, M.D.   DATE OF BIRTH:  1940-08-06   DATE OF ADMISSION:  08/17/2005  DATE OF DISCHARGE:  08/23/2005                                 DISCHARGE SUMMARY   DISCHARGE DIAGNOSES:  1.  Coronary artery disease, empiric medical treatment.  2.  Acute-on-chronic renal failure, improved.  3.  Anemia, iron therapy.  4.  Jehovah witness.  5.  History of tobacco abuse now on home oxygen therapy.  6.  Diabetes mellitus not treated.  7.  Hyperlipidemia.  8.  Marked obesity.  9.  Discoid lupus.  10. Hypertension.  11. Allergies.  12. Hypothyroidism.  13. Gastroesophageal reflux disease.  14. Asthma/obstructive sleep apnea.  15. History of bilateral tubal ligation, cholecystectomy and carpal tunnel      release.  75. Family history of coronary artery disease.  17. Multiple medication use.  18. Hyperkalemia corrected.   Br[facility] is a 70 year old female, who was admitted to [facility] on  August 17, 2005 with chest pain that has been intermittent over the past 2-  3 weeks prior to admission.  Prior to admission she had been on diuretics  and these were held, as well as her ACE inhibitor.  The patient had acute-on-  chronic renal insufficiency during admission with a creatinine as high as  3.8 and had normalized to 1.8 by discharge.  Because of the issue of acute-  on-chronic renal insufficiency, we had considered cardiac catheterization  with a possible percutaneous coronary intervention.  In addition, the  patient knew that with her presumed cardiac disease that she may  ultimately  need coronary artery bypass grafting, but Dr. Prescott Gum felt that she was  not a candidate for the bypass grafting.  Therefore, the remaining options  were empiric medical therapy or cardiac  catheterization +/- PCI and the  consensus from the patient and the family was medical therapy.  Therefore,  no cardiac catheterization and we would maximize the medical therapy.   During the patient's hospitalization, the hospital renal service was  consulted and helped with aggressive treatment of her renal insufficiency.  Dr. Edrick Oh was the consultant.  By August 23, 2005, the patient was  felt to be ready for discharge to home.  She has denied any further chest  pain.   LABORATORY STUDIES:  A BUN of 28, creatinine 1.7, potassium 4.2, sodium 137,  hemoglobin of 9.2, hematocrit of 27.2 and white count of 9.3.   Diet will be recommended as low-fat, low-salt diabetic diet.  She can  increase her activities slowly.  She is to follow up with Dr. Tamala Julian in the  next several weeks.  The office will call for the patient's appointment.  She is to weigh daily and record these weights, and if she has any increase  in weight over 2 pounds for a 24-hour period, she is to let Dr.  Smith know.   At this point, her discharge medications include:  1.  Enteric-coated aspirin 325 mg a day.  2.  Lexapro 20 mg a day.  3.  Nexium 40 mg a day.  4.  Reglan 10 mg t.i.d.  5.  Klonopin 1 mg q.h.s.  6.  Multivitamin with iron daily.  7.  Zocor 20 mg a day.  8.  Allegra 1 a day for allergies.  9.  Levoxyl 75 mcg 1 a day.  10. Albuterol p.r.n.  11. Insulin 20 units.  She takes this as needed.  12. Desipramine as prior to admission.  13. Iron 3 times daily.   She is to stop her lisinopril, Lasix, Demadex and Actos.  We have  recommended that she follow up with her doctor at [facility] in 1 week.  Dr. Justin Mend had recommended that the patient begin Aranesp 100 mcg weekly  subcutaneously and we have recommended that she gets that injection from her  primary care doctor.  I have asked her to follow up with Dr. Tamala Julian again in  2 weeks and our office will contact the patient for this visit.      Joesphine Bare, P.A.      Belva Crome, M.D.  Electronically Signed    LB/MEDQ  D:  08/23/2005  T:  08/23/2005  Job:  [id number]   cc:   [place] , [place] [id number]   Belva Crome, M.D.  Fax: [phone]

## 2010-11-20 NOTE — H&P (Signed)
NAME:  [facility]                   ACCOUNT NO.:  [id number]   MEDICAL RECORD NO.:  [id number]          PATIENT TYPE:  OBV   LOCATION:  [id number]                         FACILITY:  [facility]   PHYSICIAN:  Broadus John, MD DATE OF BIRTH:  [birth year]   DATE OF ADMISSION:  08/17/2005  DATE OF DISCHARGE:  07/22/2005                                HISTORY & PHYSICAL   Br[facility]any [facility] is a 70 year old black woman who was admitted to [facility] for further evaluation of chest pain.   The patient, who has no past history of cardiac disease, presented to the  emergency department via EMS after experiencing chest pain today. It began  this morning when she walking from her bedroom to the bathroom. The chest  pain was described as a band of pressure across her lower anterior chest or  upper abdomen. It radiated to her right arm. It was associated with dyspnea,  diaphoresis, and nausea. It improved somewhat after she stopped walking, it  persisted through the ensuing hours throughout the morning and into the  afternoon. It is resolved at this time. There were no exacerbating or  ameliorating factors. It appeared not to be related to position, meals, or  respirations.   As noted, the patient had no documented history of coronary artery disease,  nor is there is a history of congestive heart failure or arrhythmia.   The patient has a number of risk factors for coronary artery disease  including hypertension, dyslipidemia, diabetes, and smoking (discontinued  some years ago).  There is a family history of heart disease (mother).   The patient has a host of other medical problems including renal failure,  hypothyroidism, depression, lupus, asthma, gastroesophageal reflux,  obstructive sleep apnea, and chronic anemia.   MEDICATIONS:  Actos, insulin, furosemide, Synthroid, Demadex, desipramine,  Lexapro, Reglan, iron, Klonopin, Zocor, Allegra, lisinopril, and Nexium.   ALLERGIES:  None.   SOCIAL HISTORY:  The patient lives alone. She does not drink alcohol. She  does not smoke cigarettes.   OPERATIONS:  Tubal ligation, cholecystectomy, right trigger finger release.   FAMILY HISTORY:  The patient's mother suffered from diabetes and coronary  artery disease. Father died of pneumonia. There is a history of diabetes  amongst her siblings.   REVIEW OF SYSTEMS:  No new problems related to her head, eyes, ears, nose,  mouth, throat, lungs, gastrointestinal system, genitourinary system, or  extremities. There is no history of neurologic or psychiatric disorder other  than as described above.  There is no history of fever, chills, or weight  loss.   PHYSICAL EXAMINATION:  VITAL SIGNS: Blood pressure 102/70, pulse 81 and  regular, respirations 20, temperature 96.9.  Pulse oximetry 96% on room air.  GENERAL: The patient is an obese, older black woman in no discomfort. She is  alert, oriented, appropriate, and responsive.  HEENT: Normal.  NECK: Without thyromegaly or adenopathy. Carotid pulses are palpable  bilaterally and without bruits.  CARDIAC: Normal S1 and S2. There is no S3, S4, murmur, rub, or click.  Cardiac rhythm was regular. No  chest wall tenderness noted.  LUNGS: Clear.  ABDOMEN: Soft and nontender. There is no mass, hepatosplenomegaly, bruits,  distention, rebound, guarding, or rigidity. Bowel sounds are normal.  BREASTS/PELVIC/RECTAL EXAM:  Not performed as they are not pertinent to the  region for acute care hospitalization.  EXTREMITIES: Without edema, deviation, or deformity. Radial and dorsalis  pedis pulses are palpable bilaterally.  NEUROLOGIC: Prescreening neurologic survey was unremarkable.   The electrocardiogram revealed sinus rhythm with sinus arrhythmia. Low-  voltage QRS complexes were noted throughout. There was nonspecific T-wave  abnormalities. There were no findings specific for ischemia or infarction.  The chest radiograph, according to the  radiologist, demonstrated only  cardiomegaly. The initial set of cardiac markers revealed the myoglobin of  greater than 500, CK-MB 3.8, and troponin less than 0.05.  A second set of  cardiac markers revealed a myoglobin of greater than 500, CK-MB 7.1, and  troponin less than 0.05.  White count was 10.3, hemoglobin 10.5, hematocrit  32.0.  Fibrin derivatives were 0.8. Potassium was 6.3, BUN 89, and  creatinine 3.8. The remaining studies were pending at the time of this  dictation.   IMPRESSION:  1.  Hyperkalemia.  2.  Chest pain; rule out coronary artery disease.  3.  Hypertension.  4.  Renal failure.  5.  Dyslipidemia.  6.  Diabetes mellitus.  7.  Hypothyroidism.  8.  Depression.  9.  Lupus.  10. Asthma.  11. Gastroesophageal reflux.  12. Obstructive sleep apnea.  13. Chronic anemia.   PLAN:  1.  Telemetry.  2.  Treat hyperkalemia.  3.  Serial cardiac enzymes.  4.  Aspirin.  5.  Intravenous heparin.  6.  Intravenous nitroglycerin.  7.  Metoprolol.  8.  Further measures per Dr. Tamala Julian.      Broadus John, MD  Electronically Signed     MSC/MEDQ  D:  08/17/2005  T:  08/18/2005  Job:  [id number]   cc:   Belva Crome, M.D.  Fax: [phone]

## 2010-11-20 NOTE — Consult Note (Signed)
NAME:  [facility], [facility]                   ACCOUNT NO.:  [id number]   MEDICAL RECORD NO.:  [id number]          PATIENT TYPE:  INP   LOCATION:  3708                         FACILITY:  [facility]   PHYSICIAN:  Ivin Poot, M.D.  DATE OF BIRTH:  1941-04-01   DATE OF CONSULTATION:  08/19/2005  DATE OF DISCHARGE:                                   CONSULTATION   PRIMARY CARE PHYSICIAN:  [facility].   REASON FOR CONSULTATION:  Evaluate patient for potential surgical coronary  revascularization.   CHIEF COMPLAINT:  Chest pain and shortness of breath.   HISTORY OF PRESENT ILLNESS:  I was asked to evaluate this 70 year old black  obese diabetic female for potential candidacy for surgical  revascularization, as  she was admitted with new onset angina. The patient  has multiple medical problems, although has never previously been treated  for angina. Her cardiac enzymes were negative and her EKG was nonspecific on  admission. A 2-D echocardiogram performed in 2003 demonstrated normal LV  function, without valvular heart disease. An echocardiogram has not been  performed at this time on admission. The patient has multiple risk factors  for coronary artery disease, including hypertension, obesity,  hyperlipidemia, history of smoking and diabetes. The patient is currently  stable without angina on IV heparin. The patient has not been cathed yet  because of concerns regarding an elevated creatinine, which has varied  between 2.8 and 5.5 over the past 2-3 years.   The patient denied any resting symptoms of angina, orthopnea or PND.   HOME MEDICATIONS:  1.  Actos.  2.  Insulin.  3.  Lasix.  4.  Synthroid.  5.  Demadex.  6.  Dezpyramine.  7.  Lexapro.  8.  Reglan.  9.  Iron.  10. Klonopin.  11. Zocor.  12. Allegra.  13. Lisinopril.  14. Nexium.   ALLERGIES:  None.   PAST MEDICAL HISTORY:  1.  Diabetes.  2.  Chronic renal failure.  3.  Hypertension.  4.  Dyslipidemia.  5.   Hypothyroidism.  6.  Lupus.  7.  Depression.  8.  Obstructive sleep apnea.  9.  Chronic anemia.  10. COPD asthma.   FAMILY HISTORY:  Negative for family history of coronary bypass surgery;  positive diabetes and hypertension.   SOCIAL HISTORY:  The patient lives alone.  She does not drink nor use  tobacco. She did smoke in the past, but quit about 5 years ago.   REVIEW OF SYSTEMS:  Constitutional:  Review is negative. Weight has been  around 330 pounds over the past 6 months. She denies any swallowing  difficulties. She has only two teeth in her mandible. GI:  She denies any GI  bleeding. CARDIOVASCULAR:  She denies any  history of upper respiratory  infection. NEUROLOGIC:  She denies stroke or seizure. EXTREMITIES:  She  denies history of DVT or claudication.   PHYSICAL EXAMINATION:  VITAL SIGNS:  The patient appears to be 5' 5 and her  last weight is 333 pounds. Blood pressure 140/70, pulse 72, respirations 18  she is afebrile.  GENERAL APPEARANCE:  That of a morbidly obese female who appears older than  her stated age, in her hospital room on IV heparin infusion. She is in no  distress.  HEENT:  Normocephalic.  Two teeth remaining in the mandible, which appear to  be diseased.  NECK:  Without mass, thyromegaly or bruit.  CARDIOVASCULAR:  Thorax is without deformity. Breath sounds are distant  without wheezes. Cardiac exam reveals regular rhythm without murmur or  gallop.  ABDOMEN:  Obese, soft and nontender.  BREASTS:  Pendulous.  EXTREMITIES:  Reveal 1-2+ edema.  No clubbing or cyanosis. Peripheral pulses  are not palpable.  NEUROLOGIC:  Nonfocal.  She is alert, oriented.   LABORATORY DATA:  I reviewed the CT scan of her chest, which shows no  pulmonary emboli with mediastinal adenopathy. No pulmonary masses. Her  creatinine is 3.8 on admission, her BUN is 90 and her hematocrit is 32%. She  is in a sinus rhythm by EKG.   IMPRESSION AND RECOMMENDATIONS:  The patient is  not a candidate for surgical  coronary bypass surgery due to her morbid obesity, short stature and  multiple medical problems. She would not survive sternotomy and bypass  surgery. If the patient undergoes cardiac catheterization and requires  percutaneous intervention, she would not be a candidate for emergency bypass  surgery as surgical backup. This was discussed with the patient and her  daughter.  All questions were addressed.      Ivin Poot, M.D.  Electronically Signed     PV/MEDQ  D:  08/19/2005  T:  08/19/2005  Job:  [id number]

## 2010-12-16 ENCOUNTER — Ambulatory Visit (INDEPENDENT_AMBULATORY_CARE_PROVIDER_SITE_OTHER): Payer: PRIVATE HEALTH INSURANCE | Admitting: Family Medicine

## 2010-12-16 ENCOUNTER — Encounter: Payer: Self-pay | Admitting: Family Medicine

## 2010-12-16 VITALS — BP 153/81 | HR 71 | Wt 292.2 lb

## 2010-12-16 DIAGNOSIS — E119 Type 2 diabetes mellitus without complications: Secondary | ICD-10-CM

## 2010-12-16 DIAGNOSIS — D649 Anemia, unspecified: Secondary | ICD-10-CM

## 2010-12-16 DIAGNOSIS — E669 Obesity, unspecified: Secondary | ICD-10-CM

## 2010-12-16 LAB — CBC
HCT: 35.6 % — ABNORMAL LOW (ref 36.0–46.0)
Hemoglobin: 12.1 g/dL (ref 12.0–15.0)
MCH: 23.7 pg — ABNORMAL LOW (ref 26.0–34.0)
MCHC: 34 g/dL (ref 30.0–36.0)
MCV: 69.8 fL — ABNORMAL LOW (ref 78.0–100.0)
Platelets: 238 10*3/uL (ref 150–400)
RBC: 5.1 MIL/uL (ref 3.87–5.11)
RDW: 16.2 % — ABNORMAL HIGH (ref 11.5–15.5)
WBC: 13.9 10*3/uL — ABNORMAL HIGH (ref 4.0–10.5)

## 2010-12-16 LAB — POCT GLYCOSYLATED HEMOGLOBIN (HGB A1C): Hemoglobin A1C: 6.1

## 2010-12-16 NOTE — Progress Notes (Signed)
  Subjective:    Patient ID: [facility], female    DOB: 15-Dec-1940, 70 y.o.   MRN: [id number]  HPI Chronic back pain stable Wants iron checked - feels it might be low.  Hx of anemia. Gets short of breath when walks. On the good side, she is paying more attention to diet and has lost some wt.     Review of Systems Lives with her daughter and also has an aid who helps her.    Objective:   Physical Exam Gen normal Lungs, clear Cardiac RRR Abd benign Ext trace edema. Recheck pulse ox 97% at rest on room air.  90% post ambulation.       Assessment & Plan:

## 2010-12-16 NOTE — Patient Instructions (Addendum)
I will call tomorrow with the iron result.   You call me if the itching nerves get worse and I will add a medicine. Bring in your medications to your next visit to make sure I have the correct list. See me in three months. Your diabetes and weight are excellent.  Keep up the good work.

## 2010-12-17 MED ORDER — FERROUS SULFATE 325 (65 FE) MG PO TABS: 325.0000 mg | ORAL_TABLET | Freq: Two times a day (BID) | ORAL | Status: DC

## 2010-12-17 NOTE — Assessment & Plan Note (Addendum)
Start iron based on low indices.  Colon screening up to date

## 2010-12-18 NOTE — Assessment & Plan Note (Signed)
Good control on current meds. 

## 2010-12-18 NOTE — Assessment & Plan Note (Signed)
Improved wt loss 

## 2010-12-28 ENCOUNTER — Telehealth: Payer: Self-pay | Admitting: Family Medicine

## 2010-12-28 NOTE — Telephone Encounter (Signed)
Patient dropped off handicapped placard form to be filled out.  Please call when completed.

## 2010-12-29 NOTE — Telephone Encounter (Signed)
Placed in Dr. Lowella Bandy box.

## 2010-12-30 NOTE — Telephone Encounter (Signed)
Form completed and patient notified

## 2011-01-09 ENCOUNTER — Other Ambulatory Visit: Payer: Self-pay | Admitting: Family Medicine

## 2011-01-10 NOTE — Telephone Encounter (Signed)
Refill request

## 2011-01-11 NOTE — Telephone Encounter (Signed)
Called in Rx of this controled substance.

## 2011-01-27 ENCOUNTER — Ambulatory Visit (INDEPENDENT_AMBULATORY_CARE_PROVIDER_SITE_OTHER): Payer: PRIVATE HEALTH INSURANCE | Admitting: Family Medicine

## 2011-01-27 ENCOUNTER — Encounter: Payer: Self-pay | Admitting: Family Medicine

## 2011-01-27 ENCOUNTER — Ambulatory Visit: Payer: PRIVATE HEALTH INSURANCE | Admitting: Family Medicine

## 2011-01-27 VITALS — BP 157/94 | HR 84 | Temp 98.5°F | Ht 64.5 in | Wt 286.7 lb

## 2011-01-27 DIAGNOSIS — J449 Chronic obstructive pulmonary disease, unspecified: Secondary | ICD-10-CM

## 2011-01-27 DIAGNOSIS — R197 Diarrhea, unspecified: Secondary | ICD-10-CM

## 2011-01-27 DIAGNOSIS — R69 Illness, unspecified: Secondary | ICD-10-CM

## 2011-01-27 DIAGNOSIS — Z7409 Other reduced mobility: Secondary | ICD-10-CM

## 2011-01-27 MED ORDER — DOXYCYCLINE HYCLATE 100 MG PO TABS: 100.0000 mg | ORAL_TABLET | Freq: Two times a day (BID) | ORAL | Status: AC

## 2011-01-27 NOTE — Assessment & Plan Note (Signed)
Increased sputum production, no fever.  Will Rx as mild flair with antibiotics

## 2011-01-28 LAB — CBC
HCT: 38.2 % (ref 36.0–46.0)
Hemoglobin: 13.2 g/dL (ref 12.0–15.0)
MCH: 24 pg — ABNORMAL LOW (ref 26.0–34.0)
MCHC: 34.6 g/dL (ref 30.0–36.0)
MCV: 69.3 fL — ABNORMAL LOW (ref 78.0–100.0)
Platelets: 251 10*3/uL (ref 150–400)
RBC: 5.51 MIL/uL — ABNORMAL HIGH (ref 3.87–5.11)
RDW: 16.1 % — ABNORMAL HIGH (ref 11.5–15.5)
WBC: 11.7 10*3/uL — ABNORMAL HIGH (ref 4.0–10.5)

## 2011-02-01 ENCOUNTER — Telehealth: Payer: Self-pay | Admitting: Family Medicine

## 2011-02-01 NOTE — Telephone Encounter (Signed)
Please give [facility] a call to let them know if the evaluation paperwork was brought in for the appt on 7/25.  Any representative can help when you call back.

## 2011-02-02 DIAGNOSIS — Z7409 Other reduced mobility: Secondary | ICD-10-CM | POA: Insufficient documentation

## 2011-02-02 NOTE — Assessment & Plan Note (Signed)
Largely resolved but had black stools, likely due to her iron therapy so recheck CBC

## 2011-02-02 NOTE — Progress Notes (Signed)
  Subjective:    Patient ID: [facility], female    DOB: 1940-11-03, 70 y.o.   MRN: [id number]  HPI Two major issues.  First is worsening cough and sputum production.  Carries diagnosis of asthma, but at her age more likely COPD.  Suspect restrictive componant with her weight and obstructive component with her hx of periodic wheezing.  Denies fever or SOB  Second issue is that she wants certified for a power wheelchair.  She tells me that she already has one that is outdated and needs repair.   The patient can perform her activities of daily living in her home without assistance.   Patient does not need a cane or a walker to ambulate in her home.  She ambulates without assistance.  She came to her office visit today and walked in fine without a cane, walker or wheelchair.      Review of Systems  Diarrhea has resolved.     Objective:   Physical Exam I watched her ambulate in the office and she did so safely and without assistance. Lungs, scattered wheeze.  No rales.  Good air movement.        Assessment & Plan:   COPD (chronic obstructive pulmonary disease) Increased sputum production, no fever.  Will Rx as mild flair with antibiotics

## 2011-02-03 NOTE — Telephone Encounter (Signed)
I have the paperwork and will complete it today for fax.

## 2011-02-06 ENCOUNTER — Other Ambulatory Visit: Payer: Self-pay | Admitting: Family Medicine

## 2011-02-08 NOTE — Telephone Encounter (Signed)
Refill request

## 2011-03-06 HISTORY — PX: ANKLE ARTHROSCOPY WITH OPEN REDUCTION INTERNAL FIXATION (ORIF): SHX5582

## 2011-03-12 ENCOUNTER — Telehealth: Payer: Self-pay | Admitting: Family Medicine

## 2011-03-12 NOTE — Telephone Encounter (Signed)
Spoke with patient and she states  one boil has bursted and the other is not too bad yet.   She wants antibiotic called in . Advised that she will need to be seen in order to prescribe medication . Suggested she go to urgent care now to be checked but she does not want to do this and ask for appointment Monday . Appointment given. Advised to use warm compress to areas several times a day.

## 2011-03-12 NOTE — Telephone Encounter (Signed)
Noted and agree. 

## 2011-03-12 NOTE — Telephone Encounter (Signed)
Pt states she has 2 boils (on bottom) and wants to talk to nurse

## 2011-03-13 ENCOUNTER — Other Ambulatory Visit: Payer: Self-pay | Admitting: Family Medicine

## 2011-03-14 NOTE — Telephone Encounter (Signed)
Refill request

## 2011-03-15 ENCOUNTER — Ambulatory Visit: Payer: PRIVATE HEALTH INSURANCE

## 2011-03-15 ENCOUNTER — Encounter: Payer: Self-pay | Admitting: Family Medicine

## 2011-03-15 ENCOUNTER — Ambulatory Visit (INDEPENDENT_AMBULATORY_CARE_PROVIDER_SITE_OTHER): Payer: PRIVATE HEALTH INSURANCE | Admitting: Family Medicine

## 2011-03-15 VITALS — BP 135/86 | HR 78 | Ht 64.6 in | Wt 287.0 lb

## 2011-03-15 DIAGNOSIS — Z23 Encounter for immunization: Secondary | ICD-10-CM

## 2011-03-15 DIAGNOSIS — L0231 Cutaneous abscess of buttock: Secondary | ICD-10-CM

## 2011-03-15 DIAGNOSIS — L03317 Cellulitis of buttock: Secondary | ICD-10-CM

## 2011-03-15 MED ORDER — DOXYCYCLINE HYCLATE 100 MG PO TABS: 100.0000 mg | ORAL_TABLET | Freq: Two times a day (BID) | ORAL | Status: AC

## 2011-03-15 NOTE — Assessment & Plan Note (Signed)
Superficial abscess/cellulitis. No signs systemic infection or mucosal involvement to indicate HSV. With recurrence of this cellulitis,  will treat empirically for MRSA with doxycycline BID. Have obtained a culture from unroofed vesicle to test organism sensitivities. Follow up prn if symptoms worsen.

## 2011-03-15 NOTE — Progress Notes (Signed)
  Subjective:    Patient ID: [facility], female    DOB: 1940/11/01, 70 y.o.   MRN: [id number]  HPI 1. Boils on buttocks. Has noticed recurrence of 2 boils on her buttocks during past week. Denies trauma or skin wounds prior to onset. Causing some mild pain. Has not gotten bigger. No oozing, bleeding, crusting, fevers, n/v/d, no vaginal sores or pain with defecation or soiling.   Has a history of similar problem ~1 year ago treated with 3 weeks of doxycycline. Sores feel similar to patient although she cannot see them.   Review of Systems See HPI otherwise negative.    Objective:   Physical Exam  Vitals reviewed. Constitutional: She is oriented to person, place, and time. She appears well-developed and well-nourished. No distress.  HENT:  Head: Normocephalic and atraumatic.  Eyes: EOM are normal. Pupils are equal, round, and reactive to light.  Pulmonary/Chest: Effort normal.  Abdominal: Soft.  Genitourinary:       Rectum appears wnl.  Musculoskeletal: She exhibits no edema and no tenderness.  Neurological: She is alert and oriented to person, place, and time. No cranial nerve deficit. She exhibits normal muscle tone. Coordination normal.  Skin: Skin is warm and dry. No erythema.       In superior gluteal cleft has 2 superficial vesicles, ~3-4 mm in diameter. Vesicle expresses pus after unroofing. No ulceration. No skin erythema or bleeding. No mucosal involvement.           Assessment & Plan:

## 2011-03-15 NOTE — Patient Instructions (Signed)
Nice to meet you! Your skin infection is most likely a bacteria. Will grow at the lab to make sure. Start doxycycline twice daily. If symptoms worse or it spreads or you get fever, make appointment! Follow up with Dr. [name] in next 2 months like normal.

## 2011-03-18 LAB — WOUND CULTURE
Gram Stain: NONE SEEN
Gram Stain: NONE SEEN
Gram Stain: NONE SEEN
Organism ID, Bacteria: NO GROWTH

## 2011-03-29 ENCOUNTER — Inpatient Hospital Stay (HOSPITAL_COMMUNITY)
Admission: EM | Admit: 2011-03-29 | Discharge: 2011-04-06 | DRG: 493 | Disposition: A | Discharge status: Skilled Nursing Facility | Payer: PRIVATE HEALTH INSURANCE | Attending: Orthopaedic Surgery | Admitting: Orthopaedic Surgery

## 2011-03-29 ENCOUNTER — Encounter: Payer: Self-pay | Admitting: Family Medicine

## 2011-03-29 ENCOUNTER — Emergency Department (HOSPITAL_COMMUNITY): Payer: PRIVATE HEALTH INSURANCE

## 2011-03-29 DIAGNOSIS — F3289 Other specified depressive episodes: Secondary | ICD-10-CM | POA: Diagnosis present

## 2011-03-29 DIAGNOSIS — N179 Acute kidney failure, unspecified: Secondary | ICD-10-CM | POA: Diagnosis present

## 2011-03-29 DIAGNOSIS — G4733 Obstructive sleep apnea (adult) (pediatric): Secondary | ICD-10-CM | POA: Diagnosis present

## 2011-03-29 DIAGNOSIS — Z531 Procedure and treatment not carried out because of patient's decision for reasons of belief and group pressure: Secondary | ICD-10-CM

## 2011-03-29 DIAGNOSIS — E860 Dehydration: Secondary | ICD-10-CM | POA: Diagnosis present

## 2011-03-29 DIAGNOSIS — I2510 Atherosclerotic heart disease of native coronary artery without angina pectoris: Secondary | ICD-10-CM | POA: Diagnosis present

## 2011-03-29 DIAGNOSIS — G2581 Restless legs syndrome: Secondary | ICD-10-CM | POA: Diagnosis present

## 2011-03-29 DIAGNOSIS — Z880 Allergy status to penicillin: Secondary | ICD-10-CM

## 2011-03-29 DIAGNOSIS — M1990 Unspecified osteoarthritis, unspecified site: Secondary | ICD-10-CM | POA: Diagnosis present

## 2011-03-29 DIAGNOSIS — E039 Hypothyroidism, unspecified: Secondary | ICD-10-CM | POA: Diagnosis present

## 2011-03-29 DIAGNOSIS — I129 Hypertensive chronic kidney disease with stage 1 through stage 4 chronic kidney disease, or unspecified chronic kidney disease: Secondary | ICD-10-CM | POA: Diagnosis present

## 2011-03-29 DIAGNOSIS — Z9981 Dependence on supplemental oxygen: Secondary | ICD-10-CM

## 2011-03-29 DIAGNOSIS — Z888 Allergy status to other drugs, medicaments and biological substances status: Secondary | ICD-10-CM

## 2011-03-29 DIAGNOSIS — Z6841 Body Mass Index (BMI) 40.0 and over, adult: Secondary | ICD-10-CM

## 2011-03-29 DIAGNOSIS — K219 Gastro-esophageal reflux disease without esophagitis: Secondary | ICD-10-CM | POA: Diagnosis present

## 2011-03-29 DIAGNOSIS — E1149 Type 2 diabetes mellitus with other diabetic neurological complication: Secondary | ICD-10-CM | POA: Diagnosis present

## 2011-03-29 DIAGNOSIS — Z7982 Long term (current) use of aspirin: Secondary | ICD-10-CM

## 2011-03-29 DIAGNOSIS — S8263XA Displaced fracture of lateral malleolus of unspecified fibula, initial encounter for closed fracture: Principal | ICD-10-CM | POA: Diagnosis present

## 2011-03-29 DIAGNOSIS — I5032 Chronic diastolic (congestive) heart failure: Secondary | ICD-10-CM | POA: Diagnosis present

## 2011-03-29 DIAGNOSIS — Z66 Do not resuscitate: Secondary | ICD-10-CM | POA: Diagnosis present

## 2011-03-29 DIAGNOSIS — E1142 Type 2 diabetes mellitus with diabetic polyneuropathy: Secondary | ICD-10-CM | POA: Diagnosis present

## 2011-03-29 DIAGNOSIS — L930 Discoid lupus erythematosus: Secondary | ICD-10-CM | POA: Diagnosis present

## 2011-03-29 DIAGNOSIS — J449 Chronic obstructive pulmonary disease, unspecified: Secondary | ICD-10-CM | POA: Diagnosis present

## 2011-03-29 DIAGNOSIS — D638 Anemia in other chronic diseases classified elsewhere: Secondary | ICD-10-CM | POA: Diagnosis present

## 2011-03-29 DIAGNOSIS — N182 Chronic kidney disease, stage 2 (mild): Secondary | ICD-10-CM | POA: Diagnosis present

## 2011-03-29 DIAGNOSIS — J4489 Other specified chronic obstructive pulmonary disease: Secondary | ICD-10-CM | POA: Diagnosis present

## 2011-03-29 DIAGNOSIS — W19XXXA Unspecified fall, initial encounter: Secondary | ICD-10-CM | POA: Diagnosis present

## 2011-03-29 DIAGNOSIS — F329 Major depressive disorder, single episode, unspecified: Secondary | ICD-10-CM | POA: Diagnosis present

## 2011-03-29 DIAGNOSIS — I509 Heart failure, unspecified: Secondary | ICD-10-CM | POA: Diagnosis present

## 2011-03-29 DIAGNOSIS — Z79899 Other long term (current) drug therapy: Secondary | ICD-10-CM

## 2011-03-29 DIAGNOSIS — E785 Hyperlipidemia, unspecified: Secondary | ICD-10-CM | POA: Diagnosis present

## 2011-03-29 DIAGNOSIS — Z794 Long term (current) use of insulin: Secondary | ICD-10-CM

## 2011-03-29 LAB — CBC
HCT: 33.9 % — ABNORMAL LOW (ref 36.0–46.0)
Hemoglobin: 11.9 g/dL — ABNORMAL LOW (ref 12.0–15.0)
MCH: 24.7 pg — ABNORMAL LOW (ref 26.0–34.0)
MCHC: 35.1 g/dL (ref 30.0–36.0)
MCV: 70.3 fL — ABNORMAL LOW (ref 78.0–100.0)
Platelets: 208 10*3/uL (ref 150–400)
RBC: 4.82 MIL/uL (ref 3.87–5.11)
RDW: 15.3 % (ref 11.5–15.5)
WBC: 13.5 10*3/uL — ABNORMAL HIGH (ref 4.0–10.5)

## 2011-03-29 LAB — URINE MICROSCOPIC-ADD ON

## 2011-03-29 LAB — DIFFERENTIAL
Basophils Absolute: 0.1 10*3/uL (ref 0.0–0.1)
Basophils Relative: 0 % (ref 0–1)
Eosinophils Absolute: 0.2 10*3/uL (ref 0.0–0.7)
Eosinophils Relative: 1 % (ref 0–5)
Lymphocytes Relative: 22 % (ref 12–46)
Lymphs Abs: 3 10*3/uL (ref 0.7–4.0)
Monocytes Absolute: 0.8 10*3/uL (ref 0.1–1.0)
Monocytes Relative: 6 % (ref 3–12)
Neutro Abs: 9.5 10*3/uL — ABNORMAL HIGH (ref 1.7–7.7)
Neutrophils Relative %: 71 % (ref 43–77)

## 2011-03-29 LAB — BASIC METABOLIC PANEL
BUN: 14 mg/dL (ref 6–23)
CO2: 30 mEq/L (ref 19–32)
Calcium: 9.2 mg/dL (ref 8.4–10.5)
Chloride: 105 mEq/L (ref 96–112)
Creatinine, Ser: 1.33 mg/dL — ABNORMAL HIGH (ref 0.50–1.10)
GFR calc Af Amer: 48 mL/min — ABNORMAL LOW (ref >60)
GFR calc non Af Amer: 39 mL/min — ABNORMAL LOW (ref >60)
Glucose, Bld: 87 mg/dL (ref 70–99)
Potassium: 4.6 mEq/L (ref 3.5–5.1)
Sodium: 140 mEq/L (ref 135–145)

## 2011-03-29 LAB — GLUCOSE, CAPILLARY: Glucose-Capillary: 95 mg/dL (ref 70–99)

## 2011-03-29 LAB — URINALYSIS, ROUTINE W REFLEX MICROSCOPIC
Bilirubin Urine: NEGATIVE
Glucose, UA: NEGATIVE mg/dL
Ketones, ur: NEGATIVE mg/dL
Leukocytes, UA: NEGATIVE
Nitrite: NEGATIVE
Protein, ur: 30 mg/dL — AB
Specific Gravity, Urine: 1.01 (ref 1.005–1.030)
Urobilinogen, UA: 0.2 mg/dL (ref 0.0–1.0)
pH: 5.5 (ref 5.0–8.0)

## 2011-03-29 NOTE — H&P (Signed)
Family Medicine Teaching Service Consultation note  Patient name: Brittany Roberts Medical record number: [id number] Date of birth: [birth year] Age: 70 y.o. Gender: female  Primary Care Provider: Zigmund Gottron, MD  Chief Complaint: Ankle fracture History of Present Illness: Brittany Roberts is a 70 y.o. year old female presenting with ankle fracture.  Brittany Roberts was in her normal state of health today.  She stood up from the bathroom and her right leg collapsed.  She fell to the floor and had immediate right ankle pain. She denies any syncope chest pains or palpitations preceding her fall. Dr. Ninfa Linden of [facility] orthopedics, was called by the emergency room physicians when they noted a right distal fibular fracture.  He admitted the patient and requested family practice to consult for medical management. Aside from her ankle pain she is in her normal state of health. She denies any trouble breathing palpitations chest pains syncope, fevers or chills. 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 She has a DO NOT RESUSCITATE DO NOT INTUBATE.   Patient Active Problem List  Diagnoses  . HYPOTHYROIDISM  . DIABETES MELLITUS, TYPE II  . DIABETIC NEUROPATHY, TYPE 2  . DYSLIPIDEMIA  . OBESITY  . ANEMIA NOS  . DEPRESSION  . COGNITIVE IMPAIRMENT, MILD, SO STATED  . RESTLESS LEG SYNDROME  . HYPERTENSION  . CORONARY ARTERY DISEASE  . CHRONIC DIASTOLIC HEART FAILURE  . COPD (chronic obstructive pulmonary disease)  . GERD  . CHRONIC KIDNEY DISEASE STAGE II (MILD)  . LUPUS ERYTHEMATOSUS, DISCOID  . OSTEOARTHRITIS  . SLEEP APNEA  . Diarrhea  . Mobility poor  . Cellulitis and abscess of buttock   Past Medical History: Past Medical History  Diagnosis Date  . Depression   . Lupus   . Asthma   . Diabetes mellitus   . Hypertension   . Hypothyroidism     Past Surgical History: Past Surgical History  Procedure Date  . Tubal ligation     Social History: History   Social  History  . Marital Status: Single    Spouse Name: N/A    Number of Children: 4  . Years of Education: N/A   Occupational History  . RetiredMedia planner    Social History Main Topics  . Smoking status: Former Research scientist (life sciences)  . Smokeless tobacco: Never Used  . Alcohol Use: No  . Drug Use: No  . Sexually Active: Not Currently   Other Topics Concern  . Not on file   Social History Narrative   Health Care POA: Emergency Contact: daughter, Lorenso Courier [phone] or [name] [phone] of Life Plan: Does not want to be ventilated or feeding tubes. Who lives with you: Lives with daughter AntoinetteAny pets: noneDiet: Patient reports enjoying and eating junk food.  Does not regulate types of food and currently her dentures are broken so it is hard to eat some foods.Exercise: Patient does not have an exercise plan.Seatbelts: Patient reports wearing seatbelt when in vehicle.Sun Exposure/Protection: Patient reports not going outside very often.Hobbies: Patient enjoys reading the bible and watching game shows.     Family History: Family History  Problem Relation Age of Onset  . Heart disease Mother   . Pneumonia Father   . Diabetes Sister   . Diabetes Sister     Allergies: Allergies  Allergen Reactions  . Atorvastatin   . Penicillins   . Pregabalin     REACTION: swelling  . Ropinirole Hydrochloride     REACTION: swelling    Current  Outpatient Prescriptions  Medication Sig Dispense Refill  . albuterol (PROAIR HFA) 108 (90 BASE) MCG/ACT inhaler Inhale 2 puffs into the lungs every 4 (four) hours as needed for wheezing. Use only when you are wheezing  1 Inhaler  12  . amLODipine (NORVASC) 10 MG tablet Take 10 mg by mouth daily.        Marland Kitchen ammonium lactate (LAC-HYDRIN) 12 % lotion Apply every morning to affected area       . B-D INS SYRINGE 0.5CC/30GX1/2" 30G X 1/2" 0.5 ML MISC use as directed  100 each  PRN  . carvedilol (COREG) 6.25 MG tablet take 1 tablet by mouth twice a day  60  tablet  12  . clonazePAM (KLONOPIN) 1 MG tablet take 1 tablet by mouth at bedtime  30 tablet  5  . ferrous sulfate 325 (65 FE) MG tablet Take 1 tablet (325 mg total) by mouth 2 (two) times daily.  60 tablet  3  . fluticasone (FLOVENT HFA) 220 MCG/ACT inhaler Inhale 1 puff into the lungs 2 (two) times daily. Use even when feeling well  1 Inhaler  12  . gabapentin (NEURONTIN) 600 MG tablet Take 600 mg by mouth 3 (three) times daily. For neuropathy       . insulin regular (HUMULIN R) 100 UNIT/ML injection Inject 30 Units into the skin every morning.        . isosorbide mononitrate (IMDUR) 60 MG 24 hr tablet TAKE 1 TABLET BY MOUTH ONCE DAILY FOR HEART  30 tablet  11  . LEXAPRO 20 MG tablet TAKE 1 TABLET BY MOUTH ONCE DAILY  30 tablet  11  . lidocaine (LIDODERM) 5 % 1-3 every 12 hours       . metoclopramide (REGLAN) 10 MG tablet Take 10 mg by mouth 4 (four) times daily.        Marland Kitchen NEXIUM 40 MG capsule TAKE 1 CAPSULE BY MOUTH ONCE DAILY  30 capsule  11  . simvastatin (ZOCOR) 40 MG tablet Take 40 mg by mouth daily.        Marland Kitchen spironolactone (ALDACTONE) 25 MG tablet Take 25 mg by mouth daily.        Marland Kitchen SYNTHROID 75 MCG tablet TAKE 1 TABLET BY MOUTH ONCE DAILY  30 tablet  11  . traMADol (ULTRAM) 50 MG tablet TAKE 1 TABLET BY MOUTH 3 TIMES A DAY  90 tablet  1  . triamcinolone (KENALOG) 0.025 % cream Apply topically 2 (two) times daily as needed. For rash on face; disp 15 gram tube        Review Of Systems: Per HPI  Otherwise 12 point review of systems was performed and was unremarkable.  Physical Exam: Pulse:  53-61   Blood Pressure:  130/61 RR:  16   O2: 100 on ra    Gen: Well NAD, obese HEENT: EOMI,  MMM, poor dentition, recurrent chewing mastication type movements of the mouth and tongue Lungs: CTABL Nl WOB Heart: Bradycardic but regular no MRG, very distant Abd: NABS, NT, ND Exts: Right lower extremity and posterior splint and wrapped with Ace bandage.  Left lower extremity nonedematous well  perfused. Neuro: Alert and oriented x3. Mouth movements as noted above.  Cranial nerves 2 through 12 are intact   Labs and Imaging: Lab Results  Component Value Date/Time   NA 140 03/29/2011  4:47 PM   K 4.6 03/29/2011  4:47 PM   CL 105 03/29/2011  4:47 PM   CO2 30 03/29/2011  4:47 PM   BUN 14 03/29/2011  4:47 PM   CREATININE 1.33* 03/29/2011  4:47 PM   CREATININE 1.31* 09/04/2010 11:58 AM   GLUCOSE 87 03/29/2011  4:47 PM   Lab Results  Component Value Date   WBC 13.5* 03/29/2011   HGB 11.9* 03/29/2011   HCT 33.9* 03/29/2011   MCV 70.3* 03/29/2011   PLT 208 03/29/2011   Right ankle x-ray: IMPRESSION:  Displaced distal fibular fracture with slight disruption of the  ankle mortise.  QTc: 470s.  Lab Results  Component Value Date   HGBA1C 6.1 12/16/2010   Wt Readings from Last 3 Encounters:  03/15/11 287 lb (130.182 kg)  01/27/11 286 lb 11.2 oz (130.046 kg)  12/16/10 292 lb 3.2 oz (132.541 kg)     Assessment and Plan: RAYETTE MALLAS is a 70 y.o. year old female presenting with acute ankle fracture after falling at home. 1. Fall with ankle fracture: Based on history, fall does not appear to be cardiogenic or syncope in nature. Will admit to telemetry, ortho surgery planned in next few days. 2.   Diabetes Mellitus: Continue home dose of Humulin N 30 units qAM. Add sliding scale insulin, check CBG's before meals. HgbA1c 6.1 in 12/2010, recheck now. Continue gabapentin for peripheral neuropathy. 3.   Hypertension: continue home regimen of amlodipine and spironolactone, follow vitals. 4.   Hypothyroidism: continue home synthroid dose.  5.   Lupus: stable, continue to follow 6.   Asthma: continue home albuterol prn wheezing, monitor for signs of respiratory distress. 7.   Depression: continue home Lexapro dose. 8. FEN/GI: Diabetic diet. Heparin lock IV.  9.   Tardive dyskinesia: Mouth movements appear to be due to tardive dyskinesia. Pt has history of extensive Reglan use. Will avoid offending  agents, however if patient becomes delirious during hospitalization may need to use antipsychotic such as Haldol. 10. Mental status: Pt is alert and oriented at this time, but is at increased risk of delirium due to age, injury, and hospitalization. Her family is planning to be with her throughout this hospitalization. Will monitor for mental status changes and avoid anticholinergic medications. 11. Insomnia: Pt is on home dose of Clonazepam 1mg  QHS. To avoid delirium, will decrease dose to 0.5 mg QHS. Will not discontinue entirely during this hospitalization due to risk of benzodiazepine withdrawal. 12. Prophylaxis: Heparin per pharmacy protocol for DVT prophylaxis. Protonix for GI prophylaxis. 13. Code status: DNR/DNI 14. Disposition: Will need SNF placement upon discharge. Consult SW, PT/OT.  [id number]

## 2011-03-30 DIAGNOSIS — S82899A Other fracture of unspecified lower leg, initial encounter for closed fracture: Secondary | ICD-10-CM

## 2011-03-30 DIAGNOSIS — E119 Type 2 diabetes mellitus without complications: Secondary | ICD-10-CM

## 2011-03-30 LAB — HEMOGLOBIN A1C
Hgb A1c MFr Bld: 6.1 % — ABNORMAL HIGH (ref <5.7)
Mean Plasma Glucose: 128 mg/dL — ABNORMAL HIGH (ref <117)

## 2011-03-30 LAB — GLUCOSE, CAPILLARY
Glucose-Capillary: 133 mg/dL — ABNORMAL HIGH (ref 70–99)
Glucose-Capillary: 154 mg/dL — ABNORMAL HIGH (ref 70–99)
Glucose-Capillary: 178 mg/dL — ABNORMAL HIGH (ref 70–99)
Glucose-Capillary: 160 mg/dL — ABNORMAL HIGH (ref 70–99)

## 2011-03-30 LAB — CBC
HCT: 31.9 % — ABNORMAL LOW (ref 36.0–46.0)
Hemoglobin: 11.4 g/dL — ABNORMAL LOW (ref 12.0–15.0)
MCH: 25.1 pg — ABNORMAL LOW (ref 26.0–34.0)
MCHC: 35.7 g/dL (ref 30.0–36.0)
MCV: 70.1 fL — ABNORMAL LOW (ref 78.0–100.0)
Platelets: 192 10*3/uL (ref 150–400)
RBC: 4.55 MIL/uL (ref 3.87–5.11)
RDW: 15.1 % (ref 11.5–15.5)
WBC: 11.8 10*3/uL — ABNORMAL HIGH (ref 4.0–10.5)

## 2011-03-30 LAB — BASIC METABOLIC PANEL
BUN: 12 mg/dL (ref 6–23)
CO2: 26 mEq/L (ref 19–32)
Calcium: 8.8 mg/dL (ref 8.4–10.5)
Chloride: 106 mEq/L (ref 96–112)
Creatinine, Ser: 1.09 mg/dL (ref 0.50–1.10)
GFR calc Af Amer: 60 mL/min — ABNORMAL LOW (ref >60)
GFR calc non Af Amer: 49 mL/min — ABNORMAL LOW (ref >60)
Glucose, Bld: 118 mg/dL — ABNORMAL HIGH (ref 70–99)
Potassium: 3.6 mEq/L (ref 3.5–5.1)
Sodium: 138 mEq/L (ref 135–145)

## 2011-03-31 LAB — BASIC METABOLIC PANEL
BUN: 19 mg/dL (ref 6–23)
CO2: 24 mEq/L (ref 19–32)
Calcium: 9 mg/dL (ref 8.4–10.5)
Chloride: 100 mEq/L (ref 96–112)
Creatinine, Ser: 1.48 mg/dL — ABNORMAL HIGH (ref 0.50–1.10)
GFR calc Af Amer: 42 mL/min — ABNORMAL LOW (ref >60)
GFR calc non Af Amer: 35 mL/min — ABNORMAL LOW (ref >60)
Glucose, Bld: 165 mg/dL — ABNORMAL HIGH (ref 70–99)
Potassium: 3.8 mEq/L (ref 3.5–5.1)
Sodium: 135 mEq/L (ref 135–145)

## 2011-03-31 LAB — CBC
HCT: 31.9 % — ABNORMAL LOW (ref 36.0–46.0)
Hemoglobin: 11.6 g/dL — ABNORMAL LOW (ref 12.0–15.0)
MCH: 25.4 pg — ABNORMAL LOW (ref 26.0–34.0)
MCHC: 36.4 g/dL — ABNORMAL HIGH (ref 30.0–36.0)
MCV: 70 fL — ABNORMAL LOW (ref 78.0–100.0)
Platelets: 197 K/uL (ref 150–400)
RBC: 4.56 MIL/uL (ref 3.87–5.11)
RDW: 15.2 % (ref 11.5–15.5)
WBC: 11.9 K/uL — ABNORMAL HIGH (ref 4.0–10.5)

## 2011-03-31 LAB — GLUCOSE, CAPILLARY
Glucose-Capillary: 168 mg/dL — ABNORMAL HIGH (ref 70–99)
Glucose-Capillary: 195 mg/dL — ABNORMAL HIGH (ref 70–99)
Glucose-Capillary: 166 mg/dL — ABNORMAL HIGH (ref 70–99)
Glucose-Capillary: 176 mg/dL — ABNORMAL HIGH (ref 70–99)

## 2011-04-01 ENCOUNTER — Inpatient Hospital Stay (HOSPITAL_COMMUNITY): Payer: PRIVATE HEALTH INSURANCE

## 2011-04-01 LAB — GLUCOSE, CAPILLARY
Glucose-Capillary: 176 mg/dL — ABNORMAL HIGH (ref 70–99)
Glucose-Capillary: 138 mg/dL — ABNORMAL HIGH (ref 70–99)
Glucose-Capillary: 130 mg/dL — ABNORMAL HIGH (ref 70–99)
Glucose-Capillary: 152 mg/dL — ABNORMAL HIGH (ref 70–99)
Glucose-Capillary: 189 mg/dL — ABNORMAL HIGH (ref 70–99)

## 2011-04-01 LAB — BLOOD GAS, ARTERIAL
Acid-base deficit: 2.6 mmol/L — ABNORMAL HIGH (ref 0.0–2.0)
Acid-base deficit: 2.8 mmol/L — ABNORMAL HIGH (ref 0.0–2.0)
Bicarbonate: 24.7 mEq/L — ABNORMAL HIGH (ref 20.0–24.0)
Bicarbonate: 24.1 mEq/L — ABNORMAL HIGH (ref 20.0–24.0)
O2 Content: 10 L/min
O2 Content: 10 L/min
O2 Saturation: 88 %
O2 Saturation: 87.2 %
Patient temperature: 98.6
Patient temperature: 98.6
TCO2: 26.8 mmol/L (ref 0–100)
TCO2: 26 mmol/L (ref 0–100)
pCO2 arterial: 68.4 mmHg (ref 35.0–45.0)
pCO2 arterial: 63 mmHg (ref 35.0–45.0)
pH, Arterial: 7.183 — CL (ref 7.350–7.400)
pH, Arterial: 7.208 — ABNORMAL LOW (ref 7.350–7.400)
pO2, Arterial: 63.1 mmHg — ABNORMAL LOW (ref 80.0–100.0)
pO2, Arterial: 73.3 mmHg — ABNORMAL LOW (ref 80.0–100.0)

## 2011-04-01 LAB — BASIC METABOLIC PANEL
BUN: 23 mg/dL (ref 6–23)
CO2: 24 mEq/L (ref 19–32)
Calcium: 8.8 mg/dL (ref 8.4–10.5)
Chloride: 104 mEq/L (ref 96–112)
Creatinine, Ser: 1.39 mg/dL — ABNORMAL HIGH (ref 0.50–1.10)
GFR calc Af Amer: 45 mL/min — ABNORMAL LOW (ref >60)
GFR calc non Af Amer: 37 mL/min — ABNORMAL LOW (ref >60)
Glucose, Bld: 153 mg/dL — ABNORMAL HIGH (ref 70–99)
Potassium: 4.2 mEq/L (ref 3.5–5.1)
Sodium: 135 mEq/L (ref 135–145)

## 2011-04-01 LAB — CBC
HCT: 31 % — ABNORMAL LOW (ref 36.0–46.0)
Hemoglobin: 10.9 g/dL — ABNORMAL LOW (ref 12.0–15.0)
MCH: 24.8 pg — ABNORMAL LOW (ref 26.0–34.0)
MCHC: 35.2 g/dL (ref 30.0–36.0)
MCV: 70.5 fL — ABNORMAL LOW (ref 78.0–100.0)
Platelets: 176 10*3/uL (ref 150–400)
RBC: 4.4 MIL/uL (ref 3.87–5.11)
RDW: 15 % (ref 11.5–15.5)
WBC: 11.4 10*3/uL — ABNORMAL HIGH (ref 4.0–10.5)

## 2011-04-01 LAB — SURGICAL PCR SCREEN
MRSA, PCR: NEGATIVE
Staphylococcus aureus: NEGATIVE

## 2011-04-02 LAB — BASIC METABOLIC PANEL
BUN: 17 mg/dL (ref 6–23)
CO2: 25 mEq/L (ref 19–32)
Calcium: 8.7 mg/dL (ref 8.4–10.5)
Chloride: 104 mEq/L (ref 96–112)
Creatinine, Ser: 1.05 mg/dL (ref 0.50–1.10)
GFR calc Af Amer: >60 mL/min (ref >60)
GFR calc non Af Amer: 52 mL/min — ABNORMAL LOW (ref >60)
Glucose, Bld: 175 mg/dL — ABNORMAL HIGH (ref 70–99)
Potassium: 4.5 mEq/L (ref 3.5–5.1)
Sodium: 136 mEq/L (ref 135–145)

## 2011-04-02 LAB — GLUCOSE, CAPILLARY
Glucose-Capillary: 147 mg/dL — ABNORMAL HIGH (ref 70–99)
Glucose-Capillary: 157 mg/dL — ABNORMAL HIGH (ref 70–99)
Glucose-Capillary: 161 mg/dL — ABNORMAL HIGH (ref 70–99)
Glucose-Capillary: 167 mg/dL — ABNORMAL HIGH (ref 70–99)
Glucose-Capillary: 184 mg/dL — ABNORMAL HIGH (ref 70–99)

## 2011-04-02 LAB — CBC
HCT: 29.2 % — ABNORMAL LOW (ref 36.0–46.0)
Hemoglobin: 10.4 g/dL — ABNORMAL LOW (ref 12.0–15.0)
MCH: 25.1 pg — ABNORMAL LOW (ref 26.0–34.0)
MCHC: 35.6 g/dL (ref 30.0–36.0)
MCV: 70.5 fL — ABNORMAL LOW (ref 78.0–100.0)
Platelets: 205 10*3/uL (ref 150–400)
RBC: 4.14 MIL/uL (ref 3.87–5.11)
RDW: 15.3 % (ref 11.5–15.5)
WBC: 16.1 10*3/uL — ABNORMAL HIGH (ref 4.0–10.5)

## 2011-04-03 LAB — BASIC METABOLIC PANEL
BUN: 15 mg/dL (ref 6–23)
CO2: 27 mEq/L (ref 19–32)
Calcium: 9.1 mg/dL (ref 8.4–10.5)
Chloride: 103 mEq/L (ref 96–112)
Creatinine, Ser: 1.04 mg/dL (ref 0.50–1.10)
GFR calc Af Amer: >60 mL/min (ref >60)
GFR calc non Af Amer: 52 mL/min — ABNORMAL LOW (ref >60)
Glucose, Bld: 153 mg/dL — ABNORMAL HIGH (ref 70–99)
Potassium: 4.3 mEq/L (ref 3.5–5.1)
Sodium: 135 mEq/L (ref 135–145)

## 2011-04-03 LAB — GLUCOSE, CAPILLARY
Glucose-Capillary: 168 mg/dL — ABNORMAL HIGH (ref 70–99)
Glucose-Capillary: 232 mg/dL — ABNORMAL HIGH (ref 70–99)
Glucose-Capillary: 158 mg/dL — ABNORMAL HIGH (ref 70–99)

## 2011-04-03 LAB — CBC
HCT: 26.9 % — ABNORMAL LOW (ref 36.0–46.0)
Hemoglobin: 9.4 g/dL — ABNORMAL LOW (ref 12.0–15.0)
MCH: 24.7 pg — ABNORMAL LOW (ref 26.0–34.0)
MCHC: 34.9 g/dL (ref 30.0–36.0)
MCV: 70.6 fL — ABNORMAL LOW (ref 78.0–100.0)
Platelets: 171 10*3/uL (ref 150–400)
RBC: 3.81 MIL/uL — ABNORMAL LOW (ref 3.87–5.11)
RDW: 14.9 % (ref 11.5–15.5)
WBC: 14.1 10*3/uL — ABNORMAL HIGH (ref 4.0–10.5)

## 2011-04-03 NOTE — Op Note (Signed)
  NAME:  Brittany Roberts, Brittany Roberts                   ACCOUNT NO.:  [id number]  MEDICAL RECORD NO.:  [id number]  LOCATION:  4706                         FACILITY:  [facility]  PHYSICIAN:  Lind Guest. Ninfa Linden, M.D.DATE OF BIRTH:  05-Nov-1940  DATE OF PROCEDURE:  04/01/2011 DATE OF DISCHARGE:                              OPERATIVE REPORT   PREOPERATIVE DIAGNOSIS:  Right unstable ankle lateral malleolus fracture.  POSTOPERATIVE DIAGNOSIS:  Right unstable ankle lateral malleolus fracture.  PROCEDURE:  Open reduction and internal fixation of right ankle lateral malleolus using Smith and Nephew fibular plate and lag screws.  SURGEON:  Lind Guest. Ninfa Linden, MD  ANESTHESIA:  General.  BLOOD LOSS:  Less than 100 mL.  TOURNIQUET TIME:  Less than 1 hour.  ANTIBIOTICS:  600 mg of IV clindamycin.  COMPLICATIONS:  None.  INDICATIONS:  [name] is a morbidly obese 70 year old female who sustained a mechanical fall on the day of admission.  She was brought by EMS to [facility] and was found to have an unstable right ankle fracture.  She was placed in a splint, but we needed to admit her for social reasons due to the fact that she is morbidly obese and would not be able to go back home.  The family could not take care of her and needed general medicine evaluation due to her congestive heart failure. She is now cleared for surgery and she will be taken to the operating room to stabilize the ankle injury.  PROCEDURE DESCRIPTION:  After informed consent was obtained, appropriate right ankle was marked.  She was brought to the operating room and placed supine on the operating table.  General anesthesia was then obtained.  Nonsterile tourniquet was placed around her upper right thigh, and her leg and ankle were prepped and draped with DuraPrep and sterile drapes.  A time-out was called and she was identified as correct patient and correct right ankle.  I then used Esmarch to wrap up the ankle and  tourniquet was inflated to 300 mm of pressure.  An incision was then made over the fibula and dissected down the fracture.  I was able to clean the fracture and debride with irrigation and then I used tenaculum to temporarily reduce the fracture.  I then placed 2 lag screws from anterior to posterior and then a Smith and Nephew periarticular plate along the fibula to further stabilize the fibular fracture.  Once this was stabilized, I stressed the ankle mortise under the direct fluoroscopy and found it to be stable and well located in all planes.  I then copiously irrigated the tissues and closed the deep tissue with 0 Vicryl followed by 2-0 Vicryl on the subcutaneous tissue and interrupted 2-0 nylon on the skin.  Xeroform followed by well-padded sterile dressing was applied and the patient was awakened, extubated, and taken to the recovery room in stable condition. All final counts were correct and there were no complications noted.     Lind Guest. Ninfa Linden, M.D.     CYB/MEDQ  D:  04/01/2011  T:  04/01/2011  Job:  [id number] Electronically Signed by Jean Rosenthal M.D. on 04/03/2011 11:46:47 AM

## 2011-04-04 LAB — GLUCOSE, CAPILLARY
Glucose-Capillary: 158 mg/dL — ABNORMAL HIGH (ref 70–99)
Glucose-Capillary: 207 mg/dL — ABNORMAL HIGH (ref 70–99)
Glucose-Capillary: 201 mg/dL — ABNORMAL HIGH (ref 70–99)
Glucose-Capillary: 133 mg/dL — ABNORMAL HIGH (ref 70–99)

## 2011-04-05 LAB — CBC
HCT: 27.2 % — ABNORMAL LOW (ref 36.0–46.0)
Hemoglobin: 9.3 g/dL — ABNORMAL LOW (ref 12.0–15.0)
MCH: 24 pg — ABNORMAL LOW (ref 26.0–34.0)
MCHC: 34.2 g/dL (ref 30.0–36.0)
MCV: 70.3 fL — ABNORMAL LOW (ref 78.0–100.0)
Platelets: 203 10*3/uL (ref 150–400)
RBC: 3.87 MIL/uL (ref 3.87–5.11)
RDW: 14.7 % (ref 11.5–15.5)
WBC: 12.7 10*3/uL — ABNORMAL HIGH (ref 4.0–10.5)

## 2011-04-05 LAB — BASIC METABOLIC PANEL
BUN: 19 mg/dL (ref 6–23)
CO2: 30 mEq/L (ref 19–32)
Calcium: 9 mg/dL (ref 8.4–10.5)
Chloride: 102 mEq/L (ref 96–112)
Creatinine, Ser: 1.03 mg/dL (ref 0.50–1.10)
GFR calc Af Amer: 62 mL/min — ABNORMAL LOW (ref >90)
GFR calc non Af Amer: 53 mL/min — ABNORMAL LOW (ref >90)
Glucose, Bld: 133 mg/dL — ABNORMAL HIGH (ref 70–99)
Potassium: 3.7 mEq/L (ref 3.5–5.1)
Sodium: 137 mEq/L (ref 135–145)

## 2011-04-05 LAB — GLUCOSE, CAPILLARY
Glucose-Capillary: 151 mg/dL — ABNORMAL HIGH (ref 70–99)
Glucose-Capillary: 168 mg/dL — ABNORMAL HIGH (ref 70–99)
Glucose-Capillary: 190 mg/dL — ABNORMAL HIGH (ref 70–99)
Glucose-Capillary: 138 mg/dL — ABNORMAL HIGH (ref 70–99)

## 2011-04-06 LAB — GLUCOSE, CAPILLARY
Glucose-Capillary: 126 mg/dL — ABNORMAL HIGH (ref 70–99)
Glucose-Capillary: 163 mg/dL — ABNORMAL HIGH (ref 70–99)

## 2011-04-09 NOTE — Discharge Summary (Signed)
NAME:  AMYE, KUPFERMAN                   ACCOUNT NO.:  [id number]  MEDICAL RECORD NO.:  [id number]  LOCATION:  [id number]                         FACILITY:  [facility]  PHYSICIAN:  Lind Guest. Ninfa Linden, M.D.DATE OF BIRTH:  [birth year]  DATE OF ADMISSION:  03/29/2011 DATE OF DISCHARGE:  04/06/2011                              DISCHARGE SUMMARY   PRIMARY ADMITTING DIAGNOSIS:  Right unstable distal radius fracture.  SECONDARY DIAGNOSES: 1. Hypothyroidism. 2. Type 2 diabetes. 3. Diabetic neuropathy. 4. Dyslipidemia. 5. Morbid obesity. 6. Chronic anemia. 7. Depression. 8. Mild congestive heart failure. 9. Restless legs syndrome. 10.Hypertension. 11.Coronary artery disease. [id number] diastolic heart failure. 13.Chronic obstructive pulmonary disease. 14.Gastroesophageal reflux disease. 15.Osteoarthritis. 16.Sleep apnea. 17.Lupus discoid type. 18.Chronic kidney disease, stage II.  DISCHARGE DIAGNOSES: 1. Hypothyroidism.2. Type 2 diabetes. 3. Diabetic neuropathy. 4. Dyslipidemia. 5. Morbid obesity. 6. Chronic anemia. 7. Depression. 8. Mild congestive heart failure. 9. Restless legs syndrome. 10.Hypertension. 11.Coronary artery disease. [id number] diastolic heart failure. 13.Chronic obstructive pulmonary disease. 14.Gastroesophageal reflux disease. 15.Osteoarthritis. 16.Sleep apnea. 17.Lupus discoid type. 18.Chronic kidney disease, stage II.  PROCEDURE:  Open reduction and internal fixation of right unstable ankle fracture on April 01, 2011.  HOSPITAL COURSE:  [name] is a 70 year old female who on the day of admission had a mechanical fall.  She stood up in the bathroom on the day of fall and her right leg collapsed.  She felt the tore and immediately had right ankle pain.  She was brought to the [facility] and found to have an unstable right lateral malleolus fracture with widening at the ankle mortise.  It is quite unstable injury.  I did recommend an open  reduction and internal fixation.  Due to her morbid obesity, multiple medical problems, and the difficulty for her family taking care of her, we placed her in a splint, but we had to admit her for social reasons, knowing that we would fix her ankle over in a few days once the swelling has subsided.  The Family Medicine Service did follow her along as well.  She was taken to the operating room on April 01, 2011, where she underwent plating of the lateral malleolus of her right ankle successfully.  She was then kept in the step-down unit due to breathing issues for the first night, but then transferred back to telemetry.  Remainder of her hospital course was uneventful and it was recognized that she would benefit from skilled nursing placement short term while she is getting over this injury and the need for further rehabilitation while having her nonweightbearing on that right lower extremity.  DISPOSITION:  Discharged to skilled nursing facility.  DISCHARGE MEDICATIONS: 1. Colace 100 mg p.o. b.i.d. 2. Protonix 40 mg p.o. daily. 3. Lexapro 20 mg p.o. daily. 4. Norvasc 10 mg p.o. daily. 5. Enteric-coated aspirin 325 mg p.o. daily. 6. Levothyroxine 75 mcg p.o. daily. 7. Isosorbide mononitrate 60 mg p.o. daily. 8. Spironolactone 25 mg p.o. daily. 9. Iron sulfate 25 mg p.o. daily. 10.Sliding scale insulin. 11.Hydrochlorothiazide 12.5 mg p.o. daily. 12.Neurontin 600 mg p.o. b.i.d. 13.Tramadol 50 mg 1-2 tablets p.o. q.4-6 hours p.r.n. pain. 14.Ventolin multidose inhaler 2 puffs q.4 hours p.r.n.  DISCHARGE INSTRUCTIONS:  While she is in the skilled nursing facility, she will remain nonweightbearing on her right lower extremity.  We will see her back in the office next week for suture removal.  When she is in bed, she does not need to be in her walking boot, but when she is up, she needs to be in the walking boot.  She will also establish appointments with Dr. [name] with [facility]. Ninfa Linden, M.D.     CYB/MEDQ  D:  04/05/2011  T:  04/05/2011  Job:  [id number]  cc:   William A. [name], M.D.  Electronically Signed by Jean Rosenthal M.D. on 04/09/2011 09:09:09 PM

## 2011-04-19 ENCOUNTER — Other Ambulatory Visit: Payer: Self-pay | Admitting: Family Medicine

## 2011-04-19 NOTE — Telephone Encounter (Signed)
Refill request

## 2011-04-26 NOTE — Consult Note (Signed)
NAME:  Brittany Roberts, Brittany Roberts                   ACCOUNT NO.:  [id number]  MEDICAL RECORD NO.:  [id number]  LOCATION:  4706                         FACILITY:  [facility]  PHYSICIAN:  Jamal Collin. Madalyne Husk, M.D.DATE OF BIRTH:  06/01/1941  DATE OF CONSULTATION:  03/29/2011 DATE OF DISCHARGE:                                CONSULTATION   PRIMARY CARE PROVIDER:  Dr. [name] at [facility]  REFERRING PHYSICIAN:  Dr. Ninfa Linden of [facility].  CHIEF COMPLAINT:  Ankle fracture.  HISTORY OF PRESENT ILLNESS:  Brittany Roberts is a 70 year old female who presents with ankle fracture.  Brittany Roberts was in a normal state of health today, when she stood up for Brittany bathroom and Brittany right leg collapsed. She fell to Brittany floor and had immediate right ankle pain.  She denies any syncope, chest pain, palpitation preceding her fall.  Dr. Ninfa Linden of [facility] was called by Brittany emergency room physicians and they noted a right distal fibular fracture.  He admitted Brittany Roberts and requested family practice to consult for medical management as we are her primary care providers.  Except from her ankle pain, she is in a normal state of health.  She denies any trouble breathing or palpitations or chest pain, syncope, fevers or chills.  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  She has a DNR/DNI order.  His medical history is significant for hypothyroidism, type 2 diabetes, diabetic neuropathy, dyslipidemia, obesity,  anemia, depression, mild cognitive impairment, restless legs syndrome, hypertension, coronary artery disease, chronic diastolic heart failure, COPD, GERD, CKD stage II, lupus discoid type, osteoarthritis, sleep apnea and she does not use CPAP for and she has refused, diarrhea, poor mobility, and history of cellulitis and abscess on her buttocks 2 weeks ago.  SURGICAL HISTORY:  Tubal ligation.  SOCIAL HISTORY:  Single, lives in [place].  Never smoker,  not currently sexually active.  Her healthcare power of attorney is Antoinette her phone number is [phone].  Also another emergency contact is [name] [phone].  She has a DNR/DNI order.  FAMILY HISTORY:  Heart disease, pneumonia, and diabetes in first degree relatives.  ALLERGIES:  LIPITOR, PENICILLIN, PREGABALIN, and ROPINIROLE.  MEDICATIONS: 1. Albuterol 2 puffs q.4 p.r.n. 2. Amlodipine 10 daily. 3. Ammonium lactate lotion. 4. Coreg 6.25 twice a day. 5. Klonopin 1 mg at bedtime at night. 6. Iron 325 twice a day. 7. Flovent 222 inhale twice a day. 8. Neurontin 600 three times a day. 9. Insulin-N 30 in Brittany morning. 10.Imdur 60 daily. 11.Lexapro 20 daily. 12.Lidoderm patch 1 to 3 every 12 hours as needed. 13.Nexium 40 daily. 14.Simvastatin 40 daily. 15.Spironolactone 25 daily. 16.Synthroid 75 mcg daily. 17.Tramadol 50 three times a day as needed. 18.Kenalog cream applied as needed.  REVIEW OF SYSTEMS:  Please see HPI, otherwise normal.  PHYSICAL EXAMINATION:  VITAL SIGNS:  Heart rate 53-61, blood pressure 130-61, respirations 16, satting 100% on room air.  GENERAL:  Well, in no acute distress.  Obese. ENT:  EOMI.  Moist mucous membranes.  Poor dentition.  Recurrent chewing and mastication type of movements of Brittany mouth and tongue. LUNGS:  Clear to auscultation bilaterally.  Normal work of breathing confined to anterior exam, as Brittany Roberts is poorly mobile and very large HEART:  Bradycardic but regular.  No murmurs, rubs, or gallops.  Very distant heart sounds. ABDOMEN:  Normoactive bowel sounds, nontender, and nondistended. EXTREMITIES:  Right lower extremity has a posterior splint, and is wrapped with an Ace bandage.  Left lower extremity is nonedematous, well- perfused. NEUROLOGICAL:  Alert and oriented x3.  Mouth movements as noted above. Cranial nerves II through XII are intact.  LABORATORY DATA:  Basic metabolic panel is significant for potassium 4.6, creatinine  of 1.33 and glucose of 87.  CBC white count of 13.5, hemoglobin 11.9, platelets 208.  Right ankle x-ray shows a displaced distal fibular fracture with slight disruption of Brittany ankle mortise. QTc segmented 470.  Last hemoglobin A1c was 6.1 in June and her last weight was 287 pounds.  ASSESSMENT/PLAN:  Brittany Roberts is a 70 year old female with right ankle fracture after falling at home. 1. Ankle fracture. Based on history fall does not appear to be     cardiogenic or syncope type in nature.  We will admit to telemetry,     ortho surgery is planning for OR in [place] next few days after     swelling has abated. 2. Diabetes.  Continue home insulin N 30 units in Brittany morning and also     adding sliding scale insulin checking sugars q.a.c. and nightly.     We will repeat hemoglobin A1c, plan to continue gabapentin for     peripheral neuropathy. 3. Hypertension on home amlodipine and spironolactone.  We will follow     vitals and potassium. 4. Hypothyroidism.  Continue her home Synthroid dose. 5. Lupus is stable.  We will continue to follow.  Does not appear to     be an issue at this time. 6. Asthma.  Continue her home albuterol as needed and monitor for     respiratory distress. 7. Depression continue with Lexapro. 8. FEN/GI:  Diabetic diet, heparin lock IV. 9. Tardive dyskinesia appears to be based on her physical exam and     history, has a history of being on Reglan in Brittany past, have not     continued.  We will any agents if at all possible, however, if Brittany     Roberts develops delirium may have to use short-term antipsychotics     as benzodiazepines will be more dangerous 10.Mental status as noted above is at high risk for delirium based on     pain medication, age, and being in [place] hospital in-Roberts.  Brittany     Roberts does have a slight history of mild cognitive impairment.     Family plans to be with her during this hospitalization which     should help with a great deal.  We will try very  hard to avoid any     medications if possible.  We will also avoid any potentially     offending medications if possible such as benzodiazepines and pain     medications. 11.Insomnia is on a home dose of Klonopin 1 mg at night and we will     reduce this to 0.5 do abrupt withdrawal as she may go on to     benzodiazepine withdrawal.  We will try to reduce doses of Klonopin     and she is going to be on pain medications and has a higher risk     for delirium. 12.Prophylaxis.  We are using Lovenox  per pharmacy protocol for DVT     prophylaxis, and she is on her home Protonix. 13.Code status discussion with family and referring to clinic note,     she is a DNR/DNI.  Disposition will likely need skilled nursing     facility placement on discharge for short term rehab and will     consult social work and PT/OT.     Lynne Leader, MD   ______________________________ Jamal Collin [name], M.D.    EC/MEDQ  D:  03/29/2011  T:  03/29/2011  Job:  [id number]  Electronically Signed by Lynne Leader  on 04/22/2011 07:50:45 AM Electronically Signed by Madison Hickman M.D. on 04/26/2011 11:03:46 AM

## 2011-04-30 ENCOUNTER — Telehealth: Payer: Self-pay | Admitting: Family Medicine

## 2011-04-30 NOTE — Telephone Encounter (Signed)
Apryll Halim is not our patient in [place] I don't think.

## 2011-04-30 NOTE — Telephone Encounter (Signed)
Daughter is calling for her mom.  Her mom is in [place] and needs her medicine for her nerves.  She has been taking Lexapro and the daughter has some at home if Dr. [name] says that is ok.  The daughter cell # is [phone].

## 2011-04-30 NOTE — Telephone Encounter (Signed)
Called and discussed.  It is reasonable to restart lexapro 20.  Will route to Dr. Charlett Blake

## 2011-04-30 NOTE — Telephone Encounter (Signed)
Discussed with daughter, she will try to get her mother switched over to our service.  As a long term [facility] patient, she should have been admitted to [place].

## 2011-07-02 ENCOUNTER — Telehealth: Payer: Self-pay | Admitting: *Deleted

## 2011-07-02 NOTE — Telephone Encounter (Signed)
April calling to let Dr. [name] know she saw Brittany Roberts today and the patient is concerned about a raised area on her inner left ankle.  It is not warm to the touch but the patient states there is occasional pain in that area.  April states she did some teaching on blood clots and told patient to call them with any further concerns.  Has an appointment to see Dr. [name] on 07/14/2011.  Wants to make Dr. [name] aware so he can address this at her appointment.  April can be reached at [phone].     [name]

## 2011-07-09 ENCOUNTER — Other Ambulatory Visit: Payer: Self-pay | Admitting: Family Medicine

## 2011-07-09 NOTE — Telephone Encounter (Signed)
Refill request-Dr. [name] on vacation

## 2011-07-09 NOTE — Telephone Encounter (Signed)
Refill request

## 2011-07-14 ENCOUNTER — Encounter: Payer: Self-pay | Admitting: Family Medicine

## 2011-07-14 ENCOUNTER — Ambulatory Visit (INDEPENDENT_AMBULATORY_CARE_PROVIDER_SITE_OTHER): Payer: PRIVATE HEALTH INSURANCE | Admitting: Family Medicine

## 2011-07-14 DIAGNOSIS — Z7409 Other reduced mobility: Secondary | ICD-10-CM

## 2011-07-14 DIAGNOSIS — R69 Illness, unspecified: Secondary | ICD-10-CM

## 2011-07-14 DIAGNOSIS — I10 Essential (primary) hypertension: Secondary | ICD-10-CM

## 2011-07-14 MED ORDER — INSULIN NPH (HUMAN) (ISOPHANE) 100 UNIT/ML ~~LOC~~ SUSP: 30.0000 [IU] | Freq: Every day | SUBCUTANEOUS | Status: DC

## 2011-07-14 MED ORDER — MINERIN CREME EX CREA: TOPICAL_CREAM | CUTANEOUS | Status: DC

## 2011-07-14 NOTE — Patient Instructions (Signed)
The only new prescription I sent is for the skin cream.  Please carefully check the medication list below to make sure the medicines match.  If you are missing any medicines, have the pharmacy contact me for refills.

## 2011-07-16 NOTE — Assessment & Plan Note (Signed)
I want to see how she does at home before adjusting any meds.

## 2011-07-16 NOTE — Progress Notes (Signed)
  Subjective:    Patient ID: Brittany Roberts, female    DOB: 1941/03/30, 71 y.o.   MRN: [id number]  HPI  Recently DCed from skilled nursing.  She had fallen and broken her ankle.  She went to rehab and is now home.  Reportedly doing well.  No new complaints.  Leg weakness continues as before fall.  She is getting around using a walker.    We spent the full time doing med rec and me refilling all meds.  Her DM is under sub optimal control and her morbid obesity continues.  We decided that she would come back in two weeks and we would review her problesm    Review of Systems     Objective:   Physical Exam VS noted Difficult for her to stand from sitting due to quad weakness.  Ambulates fair with walker.       Assessment & Plan:

## 2011-07-16 NOTE — Assessment & Plan Note (Signed)
Still at high risk for fall but I cannot change that.

## 2011-07-28 ENCOUNTER — Ambulatory Visit: Payer: PRIVATE HEALTH INSURANCE | Admitting: Family Medicine

## 2011-08-06 ENCOUNTER — Ambulatory Visit: Payer: PRIVATE HEALTH INSURANCE | Admitting: Family Medicine

## 2011-08-08 ENCOUNTER — Other Ambulatory Visit: Payer: Self-pay | Admitting: Family Medicine

## 2011-08-08 DIAGNOSIS — E785 Hyperlipidemia, unspecified: Secondary | ICD-10-CM

## 2011-08-08 DIAGNOSIS — E1149 Type 2 diabetes mellitus with other diabetic neurological complication: Secondary | ICD-10-CM

## 2011-08-08 NOTE — Telephone Encounter (Signed)
Refill request

## 2011-08-09 NOTE — Telephone Encounter (Signed)
I have no record that she is taking clonipin.  Long acting benzos are problematic for the elderly - thus refused.  Other Rxes approved.

## 2011-08-09 NOTE — Assessment & Plan Note (Signed)
Refilled upon fax request

## 2011-08-13 ENCOUNTER — Other Ambulatory Visit: Payer: Self-pay | Admitting: Family Medicine

## 2011-08-13 NOTE — Telephone Encounter (Signed)
Refill request

## 2011-08-16 ENCOUNTER — Other Ambulatory Visit: Payer: Self-pay | Admitting: Family Medicine

## 2011-08-16 NOTE — Telephone Encounter (Signed)
Refill request

## 2011-08-16 NOTE — Telephone Encounter (Signed)
On my last review, I thought she was off this med.  However, I do see that she had a refill in January.  I am reluctant to start long acting benzo due to risk of falls.  She needs an appointment to discuss further.

## 2011-08-30 ENCOUNTER — Encounter: Payer: Self-pay | Admitting: Family Medicine

## 2011-08-30 ENCOUNTER — Ambulatory Visit (INDEPENDENT_AMBULATORY_CARE_PROVIDER_SITE_OTHER): Payer: PRIVATE HEALTH INSURANCE | Admitting: Family Medicine

## 2011-08-30 ENCOUNTER — Telehealth: Payer: Self-pay | Admitting: *Deleted

## 2011-08-30 VITALS — BP 147/74 | HR 84 | Temp 98.3°F | Ht 64.5 in | Wt 285.3 lb

## 2011-08-30 DIAGNOSIS — K219 Gastro-esophageal reflux disease without esophagitis: Secondary | ICD-10-CM

## 2011-08-30 DIAGNOSIS — E119 Type 2 diabetes mellitus without complications: Secondary | ICD-10-CM

## 2011-08-30 LAB — POCT GLYCOSYLATED HEMOGLOBIN (HGB A1C): Hemoglobin A1C: 6.2

## 2011-08-30 MED ORDER — BENZONATATE 100 MG PO CAPS: 100.0000 mg | ORAL_CAPSULE | Freq: Two times a day (BID) | ORAL | Status: AC | PRN

## 2011-08-30 MED ORDER — RANITIDINE HCL 150 MG PO CAPS: 150.0000 mg | ORAL_CAPSULE | Freq: Two times a day (BID) | ORAL | Status: DC

## 2011-08-30 NOTE — Telephone Encounter (Signed)
Patient called this AM stating she feels a fullness in her chest for 2-3 weeks. Sweating and then gets cold  for few days, Coughing and chest congestion . Sputum  is white to light brown . States she used her neb this AM and helped some.  Denies fever or chest pain. Appointment scheduled this afternoon with Dr. Francesco Sor.

## 2011-08-30 NOTE — Patient Instructions (Signed)
It was nice to meet you today Brittany Roberts. Pick up Tessalon Perles for cough and Zantac for indigestion. Take a Zantac twice a day for one month. Schedule followup appointment with Dr. [name] at your earliest convenience. If you develop fever, chills, nausea or vomiting, please call M.D. or return to clinic. If he develop chest pain, shortness of breath, numbness or tingling in your upper extremities please go to the emergency room.  Gastroesophageal Reflux Disease, Adult Gastroesophageal reflux disease (GERD) happens when acid from your stomach flows up into the esophagus. When acid comes in contact with the esophagus, the acid causes soreness (inflammation) in the esophagus. Over time, GERD may create small holes (ulcers) in the lining of the esophagus. CAUSES   Increased body weight. This puts pressure on the stomach, making acid rise from the stomach into the esophagus.   Smoking. This increases acid production in the stomach.   Drinking alcohol. This causes decreased pressure in the lower esophageal sphincter (valve or ring of muscle between the esophagus and stomach), allowing acid from the stomach into the esophagus.   Late evening meals and a full stomach. This increases pressure and acid production in the stomach.   A malformed lower esophageal sphincter.  Sometimes, no cause is found. SYMPTOMS   Burning pain in the lower part of the mid-chest behind the breastbone and in the mid-stomach area. This may occur twice a week or more often.   Trouble swallowing.   Sore throat.   Dry cough.   Asthma-like symptoms including chest tightness, shortness of breath, or wheezing.  DIAGNOSIS  Your caregiver may be able to diagnose GERD based on your symptoms. In some cases, X-rays and other tests may be done to check for complications or to check the condition of your stomach and esophagus. TREATMENT  Your caregiver may recommend over-the-counter or prescription medicines to help decrease acid  production. Ask your caregiver before starting or adding any new medicines.  HOME CARE INSTRUCTIONS   Change the factors that you can control. Ask your caregiver for guidance concerning weight loss, quitting smoking, and alcohol consumption.   Avoid foods and drinks that make your symptoms worse, such as:   Caffeine or alcoholic drinks.   Chocolate.   Peppermint or mint flavorings.   Garlic and onions.   Spicy foods.   Citrus fruits, such as oranges, lemons, or limes.   Tomato-based foods such as sauce, chili, salsa, and pizza.   Fried and fatty foods.   Avoid lying down for the 3 hours prior to your bedtime or prior to taking a nap.   Eat small, frequent meals instead of large meals.   Wear loose-fitting clothing. Do not wear anything tight around your waist that causes pressure on your stomach.   Raise the head of your bed 6 to 8 inches with wood blocks to help you sleep. Extra pillows will not help.   Only take over-the-counter or prescription medicines for pain, discomfort, or fever as directed by your caregiver.   Do not take aspirin, ibuprofen, or other nonsteroidal anti-inflammatory drugs (NSAIDs).  SEEK IMMEDIATE MEDICAL CARE IF:   You have pain in your arms, neck, jaw, teeth, or back.   Your pain increases or changes in intensity or duration.   You develop nausea, vomiting, or sweating (diaphoresis).   You develop shortness of breath, or you faint.   Your vomit is green, yellow, black, or looks like coffee grounds or blood.   Your stool is red, bloody, or black.  These symptoms could be signs of other problems, such as heart disease, gastric bleeding, or esophageal bleeding. MAKE SURE YOU:   Understand these instructions.   Will watch your condition.   Will get help right away if you are not doing well or get worse.  Document Released: 03/31/2005 Document Revised: 03/03/2011 Document Reviewed: 01/08/2011 [facility] Patient Information 2012 [place].

## 2011-08-31 ENCOUNTER — Other Ambulatory Visit: Payer: Self-pay | Admitting: Family Medicine

## 2011-08-31 NOTE — Telephone Encounter (Signed)
Refill request

## 2011-09-02 NOTE — Progress Notes (Signed)
  Subjective:    Patient ID: Brittany Roberts, female    DOB: 10-31-1940, 71 y.o.   MRN: [id number]  HPI  Cough: Patient presents to same day clinic c/o cough and chest discomfort.  Cough has been going on for a few days, but chest discomfort started today.  Coughs up clear phlegm.  Chest discomfort occurs only after eating.  Describes chest discomfort as "food getting stuck in my throat and chest."  She also states that her voice sounds hoarse today.  Denies any associated nausea/vomiting, SOB, pain that radiates to jaw or upper extremities, or diaphoresis.  She has a hx of GERD and says Nexium has not improved symptoms.  DM, type 2: Patient is due for A1c today.  Current regimen: Lantus 30 units daily.  Denies any hypoglycemic events.  Denies any change in vision, numbness/tingling of extremities, excessive sweating, headache.  Denies any foot ulcers/open wounds.   Review of Systems  Per HPI    Objective:   Physical Exam  Constitutional: No distress.  HENT:  Mouth/Throat: Oropharynx is clear and moist.  Neck: Normal range of motion. No JVD present.  Cardiovascular: Normal rate.   No murmur heard. Pulmonary/Chest: Effort normal and breath sounds normal. No respiratory distress. She has no wheezes. She has no rales. She exhibits no tenderness.  Abdominal: Soft. Bowel sounds are normal. She exhibits no distension. There is no tenderness. There is no rebound.       obese  Musculoskeletal: She exhibits edema.  Lymphadenopathy:    She has no cervical adenopathy.          Assessment & Plan:

## 2011-09-02 NOTE — Assessment & Plan Note (Signed)
Patient is due for A1C today.   Encouraged patient to schedule follow appointment with PCP for chronic medical issues.

## 2011-09-02 NOTE — Assessment & Plan Note (Addendum)
Symptoms of "food getting stuck in chest", cough, and hoarseness likely secondary to GERD. - Stop Nexium since does not seem to relieve symptoms - Start Zantac BID for 4 weeks - Tessalon Perles as needed for cough - Follow up with PCP if symptoms worsen - Handout given with foods to avoid and red flags for prompt return to clinic or ED

## 2011-09-09 ENCOUNTER — Other Ambulatory Visit: Payer: Self-pay | Admitting: Family Medicine

## 2011-09-09 NOTE — Telephone Encounter (Signed)
Refill request

## 2011-09-15 ENCOUNTER — Ambulatory Visit (INDEPENDENT_AMBULATORY_CARE_PROVIDER_SITE_OTHER): Payer: PRIVATE HEALTH INSURANCE | Admitting: Family Medicine

## 2011-09-15 ENCOUNTER — Encounter: Payer: Self-pay | Admitting: Family Medicine

## 2011-09-15 VITALS — BP 142/84 | HR 88 | Temp 98.7°F | Ht 64.6 in | Wt 282.0 lb

## 2011-09-15 DIAGNOSIS — Z7409 Other reduced mobility: Secondary | ICD-10-CM

## 2011-09-15 DIAGNOSIS — N182 Chronic kidney disease, stage 2 (mild): Secondary | ICD-10-CM

## 2011-09-15 DIAGNOSIS — I10 Essential (primary) hypertension: Secondary | ICD-10-CM

## 2011-09-15 DIAGNOSIS — E785 Hyperlipidemia, unspecified: Secondary | ICD-10-CM

## 2011-09-15 DIAGNOSIS — L0231 Cutaneous abscess of buttock: Secondary | ICD-10-CM

## 2011-09-15 DIAGNOSIS — R69 Illness, unspecified: Secondary | ICD-10-CM

## 2011-09-15 DIAGNOSIS — L03317 Cellulitis of buttock: Secondary | ICD-10-CM

## 2011-09-15 MED ORDER — SULFAMETHOXAZOLE-TRIMETHOPRIM 800-160 MG PO TABS: 1.0000 | ORAL_TABLET | Freq: Two times a day (BID) | ORAL | Status: AC

## 2011-09-15 NOTE — Patient Instructions (Signed)
I filled out those forms for you. I sent you in an antibiotic to treat the boils.  You will be on that medicine for 3 weeks. I will call you with the blood work results. See me in about one month. Great work in getting back from your ankle fracture.

## 2011-09-16 LAB — BASIC METABOLIC PANEL
BUN: 16 mg/dL (ref 6–23)
CO2: 24 mEq/L (ref 19–32)
Calcium: 9.2 mg/dL (ref 8.4–10.5)
Chloride: 107 mEq/L (ref 96–112)
Creat: 1.2 mg/dL — ABNORMAL HIGH (ref 0.50–1.10)
Glucose, Bld: 45 mg/dL — ABNORMAL LOW (ref 70–99)
Potassium: 4.2 mEq/L (ref 3.5–5.3)
Sodium: 140 mEq/L (ref 135–145)

## 2011-09-16 LAB — LIPID PANEL
Cholesterol: 152 mg/dL (ref 0–200)
HDL: 45 mg/dL (ref >39)
LDL Cholesterol: 94 mg/dL (ref 0–99)
Total CHOL/HDL Ratio: 3.4 Ratio
Triglycerides: 65 mg/dL (ref <150)
VLDL: 13 mg/dL (ref 0–40)

## 2011-09-17 NOTE — Assessment & Plan Note (Signed)
Rx with 3 week course of anti MRSA meds due to recurrence.

## 2011-09-17 NOTE — Assessment & Plan Note (Signed)
Due for recheck

## 2011-09-17 NOTE — Progress Notes (Signed)
  Subjective:    Patient ID: Brittany Roberts, female    DOB: February 05, 1941, 71 y.o.   MRN: [id number]  HPI Brittany Roberts has several issues: She had a form for me to fill out verifying that she was in the hospital and nursing home. She has recurrent skin infection on her buttocks.  It had been treated multiple times, clears and recurs. She is back to living independantly after recovering from her ankle fracture.  She is adjusting well. She is due for bloodwork.    Review of Systems     Objective:   Physical Exam Buttocks shows mild folliculitis on surrounding skin. Cardiac RRR  Lungs clear Ext trace edema.        Assessment & Plan:

## 2011-09-17 NOTE — Assessment & Plan Note (Signed)
Near goal.  No change in meds for now.

## 2011-09-17 NOTE — Assessment & Plan Note (Signed)
No recent falls but high fall risk

## 2011-09-21 ENCOUNTER — Telehealth: Payer: Self-pay | Admitting: Family Medicine

## 2011-09-21 DIAGNOSIS — G4700 Insomnia, unspecified: Secondary | ICD-10-CM

## 2011-09-21 NOTE — Telephone Encounter (Signed)
Pt was here last week and forgot to tell Dr [name] that she can't sleep and wants something to help her.

## 2011-09-21 NOTE — Telephone Encounter (Signed)
Forwarded to Dr. [name].Brittany Roberts [place]

## 2011-09-22 DIAGNOSIS — G4700 Insomnia, unspecified: Secondary | ICD-10-CM | POA: Insufficient documentation

## 2011-09-22 MED ORDER — TRAZODONE HCL 50 MG PO TABS: 50.0000 mg | ORAL_TABLET | Freq: Every evening | ORAL | Status: DC | PRN

## 2011-09-22 NOTE — Assessment & Plan Note (Signed)
Will Rx but not with benzo due to high risk falls.  She wanted clonipin but I am giving trazodone.

## 2011-10-07 ENCOUNTER — Telehealth: Payer: Self-pay | Admitting: Family Medicine

## 2011-10-07 NOTE — Telephone Encounter (Signed)
Called pt and asked her about her sx and she informed me that she was having difficulty swallowing and having some burning associated with GERD. She wanted to know if she could increase the amount of zantac that she was taking. I told her that she should only be taking the prescribed dose and taking it at the same time every day. I told her that she should come in to be seen regarding this issue and she stated that Dr. [name] wanted her to finish the course of Abx that she was given before coming back in, she said that she has another week of these. I told her that I would make an appt for her to be seen on 4.10.2013 @ 3 pm. Pt understood and agreed. I told her that I would speak with Dr. [name] about this to see if he has any other advice for her and would call her back with this information.  Forwarded to pcp for advice.Brittany Roberts [place]

## 2011-10-07 NOTE — Telephone Encounter (Signed)
Patient calling to speak to Dr. [name] about her Acid Reflux acting up.  She wants to know if she can increase her Zantac 150.

## 2011-10-08 NOTE — Telephone Encounter (Signed)
Called.  Apparently was only taking zantac, not nexium.  Advised to restart nexium, take both meds and call me in one week if not better.

## 2011-10-12 ENCOUNTER — Encounter: Payer: Self-pay | Admitting: Home Health Services

## 2011-10-13 ENCOUNTER — Encounter: Payer: Self-pay | Admitting: Family Medicine

## 2011-10-13 ENCOUNTER — Ambulatory Visit (INDEPENDENT_AMBULATORY_CARE_PROVIDER_SITE_OTHER): Payer: PRIVATE HEALTH INSURANCE | Admitting: Family Medicine

## 2011-10-13 VITALS — BP 140/68 | HR 76 | Temp 98.0°F | Ht 64.6 in | Wt 275.0 lb

## 2011-10-13 DIAGNOSIS — K219 Gastro-esophageal reflux disease without esophagitis: Secondary | ICD-10-CM

## 2011-10-15 NOTE — Progress Notes (Signed)
  Subjective:    Patient ID: Brittany Roberts, female    DOB: 1941/05/23, 71 y.o.   MRN: [id number]  HPI Brittany Roberts talks of intermitant difficulty swallowing solids.   At times she is fine.  She will sometimes reflux foul tasting fluid in her mouth.  No true vomiting or bleeding.  Recently restarted on Nexium.  She states she is slowly improving since that time.  Previous endoscopy but more than one year ago. No hoarseness, cough or choking on food.    Review of Systems     Objective:   Physical Exam Lungs clear Abd benign with minimal tenderness       Assessment & Plan:

## 2011-10-15 NOTE — Assessment & Plan Note (Signed)
Worse, now improving back on Nexium.  Patient may need repeat endo if symptoms continue

## 2011-10-15 NOTE — Patient Instructions (Signed)
Stay on the nexium Call me in two weeks if you are not improving See me in 4-6 weeks for a recheck

## 2011-10-29 ENCOUNTER — Other Ambulatory Visit: Payer: Self-pay | Admitting: Family Medicine

## 2011-11-04 ENCOUNTER — Other Ambulatory Visit: Payer: Self-pay | Admitting: Family Medicine

## 2011-11-09 ENCOUNTER — Encounter: Payer: Self-pay | Admitting: Family Medicine

## 2011-11-09 DIAGNOSIS — E1149 Type 2 diabetes mellitus with other diabetic neurological complication: Secondary | ICD-10-CM

## 2011-11-09 NOTE — Assessment & Plan Note (Signed)
Added report of diabetic eye exam.  Reviewing, she is not on ACE.  Add next visit.

## 2011-11-10 ENCOUNTER — Encounter: Payer: Self-pay | Admitting: *Deleted

## 2012-03-07 ENCOUNTER — Other Ambulatory Visit: Payer: Self-pay | Admitting: Family Medicine

## 2012-04-03 ENCOUNTER — Other Ambulatory Visit: Payer: Self-pay | Admitting: Family Medicine

## 2012-04-11 ENCOUNTER — Encounter: Payer: Self-pay | Admitting: *Deleted

## 2012-04-11 ENCOUNTER — Institutional Professional Consult (permissible substitution): Payer: PRIVATE HEALTH INSURANCE | Admitting: Pulmonary Disease

## 2012-04-12 ENCOUNTER — Ambulatory Visit (INDEPENDENT_AMBULATORY_CARE_PROVIDER_SITE_OTHER): Payer: Medicare Other | Admitting: Pulmonary Disease

## 2012-04-12 ENCOUNTER — Encounter: Payer: Self-pay | Admitting: Pulmonary Disease

## 2012-04-12 ENCOUNTER — Other Ambulatory Visit (INDEPENDENT_AMBULATORY_CARE_PROVIDER_SITE_OTHER): Payer: Medicare Other

## 2012-04-12 VITALS — BP 120/70 | HR 72 | Temp 98.0°F | Ht 63.5 in | Wt 286.8 lb

## 2012-04-12 DIAGNOSIS — J45909 Unspecified asthma, uncomplicated: Secondary | ICD-10-CM

## 2012-04-12 DIAGNOSIS — R0600 Dyspnea, unspecified: Secondary | ICD-10-CM

## 2012-04-12 DIAGNOSIS — R0609 Other forms of dyspnea: Secondary | ICD-10-CM

## 2012-04-12 DIAGNOSIS — M79609 Pain in unspecified limb: Secondary | ICD-10-CM

## 2012-04-12 DIAGNOSIS — M79669 Pain in unspecified lower leg: Secondary | ICD-10-CM

## 2012-04-12 DIAGNOSIS — E669 Obesity, unspecified: Secondary | ICD-10-CM

## 2012-04-12 LAB — BASIC METABOLIC PANEL
BUN: 26 mg/dL — ABNORMAL HIGH (ref 6–23)
CO2: 28 mEq/L (ref 19–32)
Calcium: 9.6 mg/dL (ref 8.4–10.5)
Chloride: 104 mEq/L (ref 96–112)
Creatinine, Ser: 1.4 mg/dL — ABNORMAL HIGH (ref 0.4–1.2)
GFR: 46.33 mL/min — ABNORMAL LOW (ref >60.00)
Glucose, Bld: 135 mg/dL — ABNORMAL HIGH (ref 70–99)
Potassium: 5 mEq/L (ref 3.5–5.1)
Sodium: 139 mEq/L (ref 135–145)

## 2012-04-12 MED ORDER — FLUTICASONE PROPIONATE 50 MCG/ACT NA SUSP: 2.0000 | Freq: Every day | NASAL | Status: DC

## 2012-04-12 NOTE — Progress Notes (Signed)
  Subjective:    Patient ID: Brittany Roberts, female    DOB: October 03, 1940, 71 y.o.   MRN: [id number]  HPI    Review of Systems  Constitutional: Positive for unexpected weight change. Negative for fever and appetite change.  HENT: Positive for dental problem. Negative for ear pain, congestion, sore throat, sneezing and trouble swallowing.   Respiratory: Positive for cough and shortness of breath.   Cardiovascular: Positive for leg swelling. Negative for chest pain and palpitations.  Gastrointestinal: Negative for abdominal pain.  Musculoskeletal: Positive for arthralgias. Negative for joint swelling.  Skin: Negative for rash.  Neurological: Negative for headaches.  Psychiatric/Behavioral: Positive for dysphoric mood. The patient is nervous/anxious.        Objective:   Physical Exam        Assessment & Plan:

## 2012-04-12 NOTE — Patient Instructions (Addendum)
We will see you back in 4-6 weeks.    For evaluation of your shortness of breath, I would like to obtain a CT chest. Because you have leg pain, we will also check your legs for blood clots.   Please enrol in pulmonary rehabilitation. A referral was placed in the system. I also recommend weight loss, it will help with the shortness of breath.   Please start Flonase, a nasal spray for nasal congestion.

## 2012-04-12 NOTE — Progress Notes (Signed)
71 yo woman presents to clinic as a new consult requested by Dr. Vista Lawman for asthma management.    71 yo with complex PMH significant for Obesity, deconditioning, diastolic dysfunction, Discoid Lupus erythematous and  Asthma presents to the clinic for dyspnea. We do have spirometry available on file from 03/28/12 FEV1 76%, FVC 81%, FEV1/FVC 71. She reports minimal cough , her main complaint is dyspnea at rest and with exertion, currently is not wearing oxygen at rest but does wear nocturnal oxygen for sleep apnea; she reports no recent episodes of asthma exacerbation and visits to the ED or hospitalizations. She has not been enrolled in pulmonary rehabilitation.  Otherwise denies hemoptysis, chest pain, paroxysmal nocturnal dyspnea, fever or chills; admits nasal congestion, sinus pressure, GERD.  Admits lower extremity edema and calf pain.    Past Medical History  Diagnosis Date  . Depression   . Lupus   . Asthma   . Diabetes mellitus   . Hypertension   . Hypothyroidism   . Congestive heart failure, unspecified   . Other and unspecified hyperlipidemia   . Unspecified vitamin D deficiency   . Renal failure, unspecified   . OSA (obstructive sleep apnea)     Past Surgical History  Procedure Date  . Tubal ligation   . Nasal sinus surgery   . Gallbladder surgery    Family History  Problem Relation Age of Onset  . Heart disease Mother   . Pneumonia Father   . Diabetes Sister   . Diabetes Sister   . Diabetes Mother     History   Social History  . Marital Status: Single    Spouse Name: N/A    Number of Children: 4  . Years of Education: N/A   Occupational History  . RetiredMedia planner    Social History Main Topics  . Smoking status: Former Research scientist (life sciences)  . Smokeless tobacco: Never Used  . Alcohol Use: No  . Drug Use: No  . Sexually Active: Not Currently   Other Topics Concern  . Not on file   Social History Narrative   Health Care POA: Emergency Contact: daughter,  Lorenso Courier [phone] or [name] [phone] of Life Plan: Does not want to be ventilated or feeding tubes. Who lives with you: Lives with daughter AntoinetteAny pets: noneDiet: Patient reports enjoying and eating junk food.  Does not regulate types of food and currently her dentures are broken so it is hard to eat some foods.Exercise: Patient does not have an exercise plan.Seatbelts: Patient reports wearing seatbelt when in vehicle.Sun Exposure/Protection: Patient reports not going outside very often.Hobbies: Patient enjoys reading the bible and watching game shows.     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 Regular rate and rhythm.  Respiratory: Decreased breath sounds bilaterally with scattered wheezing No increased work of breathing.  Abdomen: soft NT ND BS+.   Extremities: no pedal edema. Homans negative  Central nervous system: Alert and oriented No focal neurological deficits  Musculoskeletal normal tone  Psychological anxius   Current Outpatient Prescriptions  Medication Sig Dispense Refill  . acetaminophen-codeine (TYLENOL #3) 300-30 MG per tablet Take 1-2 tablets by mouth every 8 (eight) hours as needed.      Marland Kitchen albuterol (PROAIR HFA) 108 (90 BASE) MCG/ACT inhaler Inhale 2 puffs into the lungs every 4 (four) hours as needed for wheezing. Use only when you are wheezing  1 Inhaler  12  . amLODipine (NORVASC) 10 MG tablet TAKE 1 TABLET BY MOUTH ONCE DAILY   30 tablet  11  . B-D INS SYRINGE 0.5CC/30GX1/2" 30G X 1/2" 0.5 ML MISC use as directed  100 each  PRN  . Biotin (BIOTIN 5000) 5 MG CAPS Take 1 capsule by mouth 2 (two) times daily.       Marland Kitchen BLACK CURRANT SEED OIL PO Take 1 capsule by mouth daily.      . carvedilol (COREG) 6.25 MG tablet take 1 tablet by mouth twice a day  60 tablet  12  . escitalopram (LEXAPRO) 20 MG tablet TAKE 1 TABLET BY MOUTH ONCE DAILY  30 tablet  11  . ferrous sulfate 325 (65 FE) MG tablet take 1 tablet by mouth twice a day  60 tablet  3  . fluticasone (FLOVENT HFA) 220 MCG/ACT inhaler Inhale 1 puff into the lungs 2 (two) times daily. Use even when feeling well  1 Inhaler  12  . furosemide (LASIX) 20 MG tablet Take 20 mg by mouth daily.      Marland Kitchen gabapentin (NEURONTIN) 600 MG tablet take 1 tablet by mouth three times a day for NEUROPATHY  90 tablet  12  . glucosamine-chondroitin 500-400 MG tablet Take 1 tablet by mouth 3 (three) times daily.      . insulin NPH (HUMULIN N) 100 UNIT/ML injection Inject 30 Units into the skin daily.  10 mL  12  . isosorbide mononitrate (IMDUR) 60 MG 24 hr tablet TAKE 1 TABLET BY MOUTH ONCE DAILY FOR HEART  30 tablet  11  . metoCLOPramide (REGLAN) 10 MG tablet take 1 tablet by mouth four times a day  120 tablet  12  . NEXIUM 40 MG capsule TAKE 1 CAPSULE BY MOUTH ONCE DAILY  30 each  11  . PRODIGY NO CODING BLOOD GLUC test strip TEST once daily  50 each  11  . simvastatin (ZOCOR) 40 MG tablet TAKE 1 TABLET BY MOUTH ONCE DAILY FOR CHOLESTEROL  30 tablet  11  . Skin Protectants, Misc. (MINERIN) CREA Apply twice a day to dry skin  454 g  12  . spironolactone (ALDACTONE) 25 MG tablet       . SYNTHROID 75 MCG tablet TAKE 1 TABLET BY MOUTH ONCE DAILY  30 tablet  11  . traMADol (ULTRAM) 50 MG tablet take 1 tablet by mouth three times a day  90 tablet  5  . traZODone (DESYREL) 50 MG tablet take 1 tablet by mouth at bedtime if needed for sleep  30 tablet  6  . triamcinolone (KENALOG) 0.025 % cream apply to  affected area twice a day TO FACE RASH  15 g  3  . DISCONTD: spironolactone (ALDACTONE) 25 MG tablet TAKE 1 TABLET BY MOUTH ONCE DAILY  30 tablet  11    Labs and tests   CTA 2005 Clinical Data: Shortness of breath and chest pressure, getting increasingly worse in the last month. Hemoptysis.  CT ANGIOGRAPHY OF THE CHEST WITH INTRAVENOUS CONTRAST:  There is no evidence  of pulmonary embolus. There is some mild dependent atelectasis in the posterior aspect of both lungs. The patient does have bronchitic changes bilaterally, particularly in the left upper lobe in the perihilar region but there is no consolidative infiltrate. There are mild emphysematous changes throughout both upper lobes. There is mild mediastinal and bilateral hilar adenopathy which is nonspecific. Does the patient have a history of sarcoidosis? The largest node in the mediastinum is in the azygos region and measures 2.3 cm in size.  IMPRESSION:  No pulmonary embolus. Mediastinal adenopathy including azygos and subcarinal nodes which are enlarged. Bilateral mild hilar adenopathy. The appearance is nonspecific but could be due to sarcoidosis.  Incidental note is made of a 4.6 cm mass in the left lobe of the thyroid gland. The visualized portion of the right lobe is normal. Does the patient have a history of goiter? If not, further evaluation by thyroid ultrasound may be useful. The mass does extend substernally.  03/01/2005 thyroid nodule biopsy INTERPRETATION(S): FINE NEEDLE ASPIRATION (THIN PREP, SMEARS): CYTOLOGIC FINDINGS CONSISTENT WITH A HYPERPLASTIC NODULE, SEE COMMENT.  2Decho Diastolic dysf, EF [id number] no pulmonary  A/P    1. Dyspnea with exertion most likely related to asthmatic and deconditioning, also due to obesity. We will obtain a CTA and lower extremity dopplers prior to the next visit to assess lung parenchyma, evaluate previous abnormalities evident on CT 2005 and rule out PE. Doubt that it is related to VTE; the  patient has no chest pain, hemoptysis, tachycardia or hypoxia at rest or with exertion. I advised the patient to enrol in pulmonary rehabilitation.   2. Asthma Current bronchodilators: Flovent, Albuterol and Proair.   Spirometry 9/13 Pre bronchodilators FEV1 1.276L 73%  FVC 2.482 L 108%                             Post bronchodilators FEV1 1.325 76%  FVC 1.864 L 73%    In regards to symptom control it is difficult to tell how much of her symptoms are related to asthma and how much to obesity. Obesity definatly correlates with higher risk of asthma, worse asthma control, more severe asthma and increased health care resources utilization. Before stepping up on therapy I would like to optimize her respiratory status by having the rhinitis under control and evaluating the benefit of pulmonary rehabilitation and weight loss.     3. DLE She received a recent prednisone taper due to LE edema bilived to be secondary to DLE a couple of months prior. There is a risk of progression of DLE to SLE. CTA will help identify lupus related parenchymal disease.   4. Allergic rhinitis Initiate  Nasonex pray. The patient report nasal bleed in the past from Flonase.  5. GERD currently on Nexium 40 mg daily. Reports no symptoms.    6. Obesity, OHS and OSA  On oxygen at night; no need for oxygen with ambulation. Advised to loose weight. Counseling provided in clinic; would benefit from a nutrition consult.  OSA she uses 2L oxygen at night; has not been able to tolerate CPAP, BiPAP in the past.   7. General health assess flu and pneumococcus vaccination with next visit.   8. Calf pain we will obtain LE dopplers.   9. Deconditioning Refer for pulmonary rehabilitation.

## 2012-04-13 ENCOUNTER — Other Ambulatory Visit: Payer: Medicare Other

## 2012-04-14 ENCOUNTER — Inpatient Hospital Stay: Admission: RE | Admit: 2012-04-14 | Payer: Medicare Other | Source: Ambulatory Visit

## 2012-04-17 ENCOUNTER — Encounter (INDEPENDENT_AMBULATORY_CARE_PROVIDER_SITE_OTHER): Payer: Medicare Other

## 2012-04-17 ENCOUNTER — Ambulatory Visit (INDEPENDENT_AMBULATORY_CARE_PROVIDER_SITE_OTHER)
Admission: RE | Admit: 2012-04-17 | Discharge: 2012-04-17 | Disposition: A | Discharge status: Home / Self Care | Payer: Medicaid Other | Source: Ambulatory Visit | Attending: Pulmonary Disease | Admitting: Pulmonary Disease

## 2012-04-17 ENCOUNTER — Inpatient Hospital Stay: Admission: RE | Admit: 2012-04-17 | Payer: Medicare Other | Source: Ambulatory Visit

## 2012-04-17 DIAGNOSIS — M79609 Pain in unspecified limb: Secondary | ICD-10-CM

## 2012-04-17 DIAGNOSIS — R0989 Other specified symptoms and signs involving the circulatory and respiratory systems: Secondary | ICD-10-CM

## 2012-04-17 DIAGNOSIS — M79669 Pain in unspecified lower leg: Secondary | ICD-10-CM

## 2012-04-17 DIAGNOSIS — R0609 Other forms of dyspnea: Secondary | ICD-10-CM

## 2012-04-17 MED ORDER — IOHEXOL 350 MG/ML SOLN
80.0000 mL | Freq: Once | INTRAVENOUS | Status: AC | PRN
  Administered 2012-04-17: 80 mL via INTRAVENOUS

## 2012-05-30 ENCOUNTER — Encounter: Payer: Self-pay | Admitting: Pulmonary Disease

## 2012-05-30 ENCOUNTER — Ambulatory Visit (INDEPENDENT_AMBULATORY_CARE_PROVIDER_SITE_OTHER): Payer: Medicare Other | Admitting: Pulmonary Disease

## 2012-05-30 VITALS — BP 124/80 | HR 68 | Temp 97.7°F | Ht 63.5 in | Wt 289.0 lb

## 2012-05-30 DIAGNOSIS — E669 Obesity, unspecified: Secondary | ICD-10-CM

## 2012-05-30 DIAGNOSIS — J45909 Unspecified asthma, uncomplicated: Secondary | ICD-10-CM

## 2012-05-30 DIAGNOSIS — G4730 Sleep apnea, unspecified: Secondary | ICD-10-CM

## 2012-05-30 DIAGNOSIS — R5381 Other malaise: Secondary | ICD-10-CM | POA: Insufficient documentation

## 2012-05-30 MED ORDER — FLUTICASONE PROPIONATE HFA 220 MCG/ACT IN AERO: 1.0000 | INHALATION_SPRAY | Freq: Two times a day (BID) | RESPIRATORY_TRACT | Status: DC

## 2012-05-30 MED ORDER — MOMETASONE FUROATE 50 MCG/ACT NA SUSP: 2.0000 | Freq: Every day | NASAL | Status: DC

## 2012-05-30 NOTE — Progress Notes (Signed)
71 yo woman presents to clinic as a new consult requested by Dr. Vista Lawman for asthma management.     HPI 71 yo with complex PMH significant for Obesity, deconditioning, diastolic dysfunction, Discoid Lupus erythematous and  Asthma presents to the clinic for dyspnea. We do have spirometry available on file from 03/28/12 FEV1 76%, FVC 81%, FEV1/FVC 71. She reports minimal cough , her main complaint is dyspnea at rest and with exertion, currently is not wearing oxygen at rest but does wear nocturnal oxygen for sleep apnea; she reports no recent episodes of asthma exacerbation and visits to the ED or hospitalizations.   Today she denies acute problems with fever or chills; increased dyspnea or cough, sick contacts, admits nasal congestion, significantly improved with Nasonex, denies sinus pressure or GERD, though is taking PPIs routinely. Denies hemoptysis and paroxysmal nocturnal dyspnea. She endorses chest pain, intermittent, last episode a month ago, without associated wheezing, cough non pleuritic, at rest and with exertion. She had seen Dr. Nadyne Coombes for this reason and she had a stress test scheduled for December and a follow up visit with him around the same time.    Past Medical History  Diagnosis Date  . Depression   . Lupus   . Asthma   . Diabetes mellitus   . Hypertension   . Hypothyroidism   . Congestive heart failure, unspecified   . Other and unspecified hyperlipidemia   . Unspecified vitamin D deficiency   . Renal failure, unspecified   . OSA (obstructive sleep apnea)     Past Surgical History  Procedure Date  . Tubal ligation   . Nasal sinus surgery   . Gallbladder surgery    Family History  Problem Relation Age of Onset  . Heart disease Mother   . Pneumonia Father   . Diabetes Sister   . Diabetes Sister   . Diabetes Mother   . Clotting disorder Mother   . Rheum arthritis Father   . Asthma Brother     History   Social History  . Marital Status: Single    Spouse  Name: N/A    Number of Children: 4  . Years of Education: N/A   Occupational History  . RetiredMedia planner    Social History Main Topics  . Smoking status: Former Smoker -- 3.0 packs/day for 20 years    Types: Cigarettes    Quit date: 07/06/1999  . Smokeless tobacco: Never Used  . Alcohol Use: No  . Drug Use: No  . Sexually Active: Not Currently   Other Topics Concern  . Not on file   Social History Narrative   Health Care POA: Emergency Contact: daughter, Lorenso Courier [phone] or [name] [phone] of Life Plan: Does not want to be ventilated or feeding tubes. Who lives with you: Lives with daughter AntoinetteAny pets: noneDiet: Patient reports enjoying and eating junk food.  Does not regulate types of food and currently her dentures are broken so it is hard to eat some foods.Exercise: Patient does not have an exercise plan.Seatbelts: Patient reports wearing seatbelt when in vehicle.Sun Exposure/Protection: Patient reports not going outside very often.Hobbies: Patient enjoys reading the bible and watching game shows.     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 Regular rate and rhythm.  Respiratory: Decreased breath sounds bilaterally with scattered wheezing No increased work of breathing.  Abdomen: soft NT ND BS+.   Extremities: no pedal edema. Homans negative  Central nervous system: Alert and oriented No focal neurological deficits Musculoskeletal normal  tone  Psychological normal mood    Current Outpatient Prescriptions  Medication Sig Dispense Refill  . acetaminophen-codeine (TYLENOL #3) 300-30 MG per tablet Take 1-2 tablets by mouth every 8 (eight) hours as needed.      Marland Kitchen albuterol (PROAIR HFA) 108 (90 BASE) MCG/ACT inhaler Inhale 2 puffs into the lungs every 4 (four) hours as needed for wheezing. Use only when you are wheezing  1 Inhaler  12  . amLODipine (NORVASC) 10 MG tablet TAKE 1 TABLET BY MOUTH ONCE DAILY  30 tablet  11  . B-D INS SYRINGE 0.5CC/30GX1/2" 30G X 1/2" 0.5 ML MISC use as directed  100 each  PRN  . Biotin (BIOTIN 5000) 5 MG CAPS Take 1 capsule by mouth 2 (two) times daily.       Marland Kitchen BLACK CURRANT SEED OIL PO Take 1 capsule by mouth daily.      . carvedilol (COREG) 6.25 MG tablet take 1 tablet by mouth twice a day  60 tablet  12  . escitalopram (LEXAPRO) 20 MG tablet TAKE 1 TABLET BY MOUTH ONCE DAILY  30 tablet  11  . ferrous sulfate 325 (65 FE) MG tablet take 1 tablet by mouth twice a day  60 tablet  3  . fluticasone (FLOVENT HFA) 220 MCG/ACT inhaler Inhale 1 puff into the lungs 2 (two) times daily. Use even when feeling well  1 Inhaler  0  . furosemide (LASIX) 20 MG tablet Take 20 mg by mouth daily.      Marland Kitchen gabapentin (NEURONTIN) 600 MG tablet take 1 tablet by mouth three times a day for NEUROPATHY  90 tablet  12  . glucosamine-chondroitin 500-400 MG tablet Take 1 tablet by mouth 3 (three) times daily.      . insulin NPH (HUMULIN N) 100 UNIT/ML injection Inject 30 Units into the skin daily.  10 mL  12  . isosorbide mononitrate (IMDUR) 60 MG 24 hr tablet TAKE 1 TABLET BY MOUTH ONCE DAILY FOR HEART  30 tablet  11  . metoCLOPramide (REGLAN) 10 MG tablet take 1 tablet by mouth four times a day  120 tablet  12  . mometasone (NASONEX) 50 MCG/ACT nasal spray Place 2 sprays into the nose daily.  17 g  0  . NEXIUM 40 MG capsule TAKE 1 CAPSULE BY MOUTH ONCE DAILY  30 each  11  . PRODIGY NO CODING BLOOD GLUC test strip TEST once daily   50 each  11  . simvastatin (ZOCOR) 40 MG tablet TAKE 1 TABLET BY MOUTH ONCE DAILY FOR CHOLESTEROL  30 tablet  11  . Skin Protectants, Misc. (MINERIN) CREA Apply twice a day to dry skin  454 g  12  . spironolactone (ALDACTONE) 25 MG tablet       . SYNTHROID 75 MCG tablet TAKE 1 TABLET BY MOUTH ONCE DAILY  30 tablet  11  . traMADol (ULTRAM) 50 MG tablet take 1 tablet by mouth three times a day  90 tablet  5  . traZODone (DESYREL) 50 MG tablet take 1 tablet by mouth at bedtime if needed for sleep  30 tablet  6  . triamcinolone (KENALOG) 0.025 % cream apply to affected area twice a day TO FACE RASH  15 g  3  . [DISCONTINUED] fluticasone (FLOVENT HFA) 220 MCG/ACT inhaler Inhale 1 puff into the lungs 2 (two) times daily. Use even when feeling well  1 Inhaler  12  . [DISCONTINUED] mometasone (NASONEX) 50 MCG/ACT nasal spray Place 2 sprays into the nose daily.        Labs and tests   CTA 2005 Clinical Data: Shortness of breath and chest pressure, getting increasingly worse in the last month. Hemoptysis.  CT ANGIOGRAPHY OF THE CHEST WITH INTRAVENOUS CONTRAST:  There is no evidence of pulmonary embolus. There is some mild dependent atelectasis in the posterior aspect of both lungs. The patient does have bronchitic changes bilaterally, particularly in the left upper lobe in the perihilar region but there is no consolidative infiltrate. There are mild emphysematous changes throughout both upper lobes. There is mild mediastinal and bilateral hilar adenopathy which is nonspecific. Does the patient have a history of sarcoidosis? The largest node in the mediastinum is in the azygos region and measures 2.3 cm in size.  IMPRESSION:  No pulmonary embolus. Mediastinal adenopathy including azygos and subcarinal nodes which are enlarged. Bilateral mild hilar adenopathy. The appearance is nonspecific but could be due to sarcoidosis.  Incidental note is made of a 4.6 cm mass in the left lobe of the thyroid gland. The  visualized portion of the right lobe is normal. Does the patient have a history of goiter? If not, further evaluation by thyroid ultrasound may be useful. The mass does extend substernally.  03/01/2005 thyroid nodule biopsy INTERPRETATION(S): FINE NEEDLE ASPIRATION (THIN PREP, SMEARS): CYTOLOGIC FINDINGS CONSISTENT WITH A HYPERPLASTIC NODULE, SEE COMMENT.  2Decho Diastolic dysf, EF [id number] no pulmonary  CTA chest 04/17/2012 IMPRESSION:  No definite evidence of pulmonary embolism though there are mild  limitations of exam secondary to slight respiratory motion and beam  hardening due to body habitus.  Minimal dependent atelectasis right lower lobe.  Question left thyroid mass 4.8 x 5.0 cm; dedicated thyroid  sonography recommended.   A/P    1. Dyspnea with exertion most likely related to asthma/emphysema and deconditioning, also due to obesity. CTA and lower extremity dopplers negative for VTE. She takes albuterol now on prn bases, very rarely not even once weekly, she denies nocturnal awakenings for dyspnea, however she feels that despite not using her inhaler, she would like her dyspnea to improve. I advised her to use Flovent 110 mcg twice daily and continue Albuterol as needed for dyspnea. She has the Flovent at home but she has not been using it regularly; I think taking it daily might improve her symptoms.  She is currently on the waiting list to enrol in pulmonary rehabilitation.   Chest pain she will see Dr. Nadyne Coombes for this reason; she reports no chest pain today.   2. Asthma Current bronchodilators: Flovent, Albuterol and Proair.   Spirometry 9/13 Pre bronchodilators FEV1 1.276L 73%  FVC 2.482 L 108%                             Post bronchodilators FEV1 1.325 76%  FVC 1.864 L 73%  Continue current therapy. Reiterated the importance of taking the steroid inhaler every day.  3. DLE She received a recent prednisone taper due to LE edema bilived to be secondary to DLE a couple of  months prior. There is a risk of progression of DLE to SLE. CTA showed no evidence of ILD related to lupus.   4. Allergic rhinitis Initiate  Nasonex pray. The patient reports nasal bleed in the past from Flonase. Nasal congestion improved with Nasonex.   5. GERD currently on Nexium 40 mg daily. Reports no symptoms.    6. Obesity, OHS and OSA  On oxygen at night; no need for oxygen with ambulation. Advised to loose weight. Counseling provided in clinic; would benefit from a nutrition consult.  OSA she uses 2L oxygen at night; has not been able to tolerate CPAP, BiPAP in the past.   7. General health up to date on  flu and pneumococcus vaccination.   8. Disposition RTC in 3 months.   9. Deconditioning Refer for pulmonary rehabilitation.   10. Enlarged thyroid on CT chest; seems to be slightly enlarged compared to previous study. We will defer further investigation to Dr. Vista Lawman.    [name], MD [facility]

## 2012-05-30 NOTE — Patient Instructions (Addendum)
We will see you back in 3 months.     A referral was placed in the system for pulmonary rehabilitation, they will call you with the first available appointment.   Please continue your efforts to loose weight, it will help with the shortness of breath.   Please continue Flonase, a nasal spray for nasal congestion and start Flovent one spray twice daily.

## 2012-06-15 ENCOUNTER — Encounter (HOSPITAL_COMMUNITY)
Admission: RE | Admit: 2012-06-15 | Discharge: 2012-06-15 | Disposition: A | Discharge status: Home / Self Care | Payer: Medicare Other | Source: Ambulatory Visit | Attending: Pulmonary Disease | Admitting: Pulmonary Disease

## 2012-06-15 ENCOUNTER — Encounter (HOSPITAL_COMMUNITY): Payer: Self-pay

## 2012-06-15 ENCOUNTER — Ambulatory Visit (HOSPITAL_COMMUNITY): Payer: Medicare Other

## 2012-06-15 DIAGNOSIS — J449 Chronic obstructive pulmonary disease, unspecified: Secondary | ICD-10-CM | POA: Insufficient documentation

## 2012-06-15 DIAGNOSIS — Z5189 Encounter for other specified aftercare: Secondary | ICD-10-CM | POA: Insufficient documentation

## 2012-06-15 DIAGNOSIS — J4489 Other specified chronic obstructive pulmonary disease: Secondary | ICD-10-CM | POA: Insufficient documentation

## 2012-06-15 NOTE — Progress Notes (Addendum)
Brittany Roberts comes in today for orientation to Pulmonary Rehab.  Health history and medications reviewed with patient.  Reviewed diabetic checks pertaining to her exercise in Pulmonary  Rehab and nutrition.  Safety and hand hygiene in the exercise area reviewed with patient.  Patient voices understanding.Demonstration and practice of PLB using pulse oximeter.  Patient able to return demonstration satisfactorily. 6 min walk test done, she was able to walk 275 feet with 2 rest breaks.  Oxygen saturations ranged from 98% to 92% at end of test.  Heart rate to 79.  Complained of knee pain, stiffness and soreness.  She plans to start exercise on 06/20/12.  We look forward to helping this nice lady improve her QOL  Total time one on one Nurse assessment and Evaluation  3:00 pm to 4:00 pm.  Leverne Humbles RN

## 2012-06-20 ENCOUNTER — Encounter (HOSPITAL_COMMUNITY): Admission: RE | Admit: 2012-06-20 | Payer: Medicare Other | Source: Ambulatory Visit

## 2012-06-22 ENCOUNTER — Encounter (HOSPITAL_COMMUNITY): Payer: Medicare Other

## 2012-06-27 ENCOUNTER — Encounter (HOSPITAL_COMMUNITY): Payer: Medicare Other

## 2012-06-29 ENCOUNTER — Encounter (HOSPITAL_COMMUNITY): Payer: Medicare Other

## 2012-07-04 ENCOUNTER — Encounter (HOSPITAL_COMMUNITY): Payer: Medicare Other

## 2012-07-06 ENCOUNTER — Encounter (HOSPITAL_COMMUNITY): Payer: Medicare Other

## 2012-07-08 ENCOUNTER — Other Ambulatory Visit: Payer: Self-pay | Admitting: Family Medicine

## 2012-07-11 ENCOUNTER — Encounter (HOSPITAL_COMMUNITY): Payer: Medicare Other

## 2012-07-13 ENCOUNTER — Encounter (HOSPITAL_COMMUNITY): Payer: Medicare Other

## 2012-07-18 ENCOUNTER — Encounter (HOSPITAL_COMMUNITY)
Admission: RE | Admit: 2012-07-18 | Discharge: 2012-07-18 | Disposition: A | Discharge status: Home / Self Care | Payer: Medicare Other | Source: Ambulatory Visit | Attending: Pulmonary Disease | Admitting: Pulmonary Disease

## 2012-07-18 DIAGNOSIS — J4489 Other specified chronic obstructive pulmonary disease: Secondary | ICD-10-CM | POA: Insufficient documentation

## 2012-07-18 DIAGNOSIS — Z5189 Encounter for other specified aftercare: Secondary | ICD-10-CM | POA: Insufficient documentation

## 2012-07-18 DIAGNOSIS — J449 Chronic obstructive pulmonary disease, unspecified: Secondary | ICD-10-CM | POA: Insufficient documentation

## 2012-07-18 NOTE — Progress Notes (Addendum)
Pts first day of exercise patient tolerated well and maintained oxygen sats greater than or equal 90% on RA. Bps were good. Reviewed and demonstrated PLB. Pt acknowledged plb and rest breaks.

## 2012-07-20 ENCOUNTER — Encounter (HOSPITAL_COMMUNITY)
Admission: RE | Admit: 2012-07-20 | Discharge: 2012-07-20 | Disposition: A | Discharge status: Home / Self Care | Payer: Medicare Other | Source: Ambulatory Visit | Attending: Pulmonary Disease | Admitting: Pulmonary Disease

## 2012-07-25 ENCOUNTER — Encounter (HOSPITAL_COMMUNITY)
Admission: RE | Admit: 2012-07-25 | Discharge: 2012-07-25 | Disposition: A | Discharge status: Home / Self Care | Payer: Medicare Other | Source: Ambulatory Visit | Attending: Pulmonary Disease | Admitting: Pulmonary Disease

## 2012-07-25 NOTE — Progress Notes (Signed)
Completed home exercise with patient. Reviewed exercise progression, routine, exercising at a comfortable pace, RPE/Dyspnea scales, how important it is to own a pulse oximeter and how to use one, weather conditions, warning signs and symptoms with exercise, and CP/NTG. We discussed when to call MD. Patient voices understanding. Patient has a goal to lose weight and start doing "more" She wants to continue her motivation and determination to achieve more. Will continue to encourage and support.

## 2012-07-27 ENCOUNTER — Encounter (HOSPITAL_COMMUNITY): Payer: Medicare Other

## 2012-08-01 ENCOUNTER — Encounter (HOSPITAL_COMMUNITY): Payer: Medicare Other

## 2012-08-03 ENCOUNTER — Encounter (HOSPITAL_COMMUNITY): Payer: Medicare Other

## 2012-08-06 ENCOUNTER — Other Ambulatory Visit: Payer: Self-pay | Admitting: Family Medicine

## 2012-08-08 ENCOUNTER — Encounter (HOSPITAL_COMMUNITY)
Admission: RE | Admit: 2012-08-08 | Discharge: 2012-08-08 | Disposition: A | Discharge status: Home / Self Care | Payer: Medicare Other | Source: Ambulatory Visit | Attending: Pulmonary Disease | Admitting: Pulmonary Disease

## 2012-08-08 ENCOUNTER — Encounter (HOSPITAL_COMMUNITY)
Admission: RE | Disposition: A | Discharge status: Home / Self Care | Payer: Self-pay | Source: Ambulatory Visit | Attending: Cardiology

## 2012-08-08 ENCOUNTER — Ambulatory Visit (HOSPITAL_COMMUNITY)
Admission: RE | Admit: 2012-08-08 | Discharge: 2012-08-08 | Disposition: A | Discharge status: Home / Self Care | Payer: Medicare Other | Source: Ambulatory Visit | Attending: Cardiology | Admitting: Cardiology

## 2012-08-08 DIAGNOSIS — I509 Heart failure, unspecified: Secondary | ICD-10-CM | POA: Insufficient documentation

## 2012-08-08 DIAGNOSIS — E1159 Type 2 diabetes mellitus with other circulatory complications: Secondary | ICD-10-CM

## 2012-08-08 DIAGNOSIS — I5032 Chronic diastolic (congestive) heart failure: Secondary | ICD-10-CM | POA: Insufficient documentation

## 2012-08-08 DIAGNOSIS — E785 Hyperlipidemia, unspecified: Secondary | ICD-10-CM | POA: Insufficient documentation

## 2012-08-08 DIAGNOSIS — J449 Chronic obstructive pulmonary disease, unspecified: Secondary | ICD-10-CM | POA: Insufficient documentation

## 2012-08-08 DIAGNOSIS — I25118 Atherosclerotic heart disease of native coronary artery with other forms of angina pectoris: Secondary | ICD-10-CM

## 2012-08-08 DIAGNOSIS — E119 Type 2 diabetes mellitus without complications: Secondary | ICD-10-CM | POA: Insufficient documentation

## 2012-08-08 DIAGNOSIS — I2510 Atherosclerotic heart disease of native coronary artery without angina pectoris: Secondary | ICD-10-CM | POA: Insufficient documentation

## 2012-08-08 DIAGNOSIS — R079 Chest pain, unspecified: Secondary | ICD-10-CM | POA: Insufficient documentation

## 2012-08-08 DIAGNOSIS — I10 Essential (primary) hypertension: Secondary | ICD-10-CM | POA: Insufficient documentation

## 2012-08-08 DIAGNOSIS — J4489 Other specified chronic obstructive pulmonary disease: Secondary | ICD-10-CM | POA: Insufficient documentation

## 2012-08-08 DIAGNOSIS — Z5189 Encounter for other specified aftercare: Secondary | ICD-10-CM | POA: Insufficient documentation

## 2012-08-08 HISTORY — PX: LEFT HEART CATHETERIZATION WITH CORONARY ANGIOGRAM: SHX5451

## 2012-08-08 LAB — GLUCOSE, CAPILLARY
Glucose-Capillary: 127 mg/dL — ABNORMAL HIGH (ref 70–99)
Glucose-Capillary: 107 mg/dL — ABNORMAL HIGH (ref 70–99)

## 2012-08-08 SURGERY — LEFT HEART CATHETERIZATION WITH CORONARY ANGIOGRAM
Anesthesia: LOCAL

## 2012-08-08 MED ORDER — SODIUM CHLORIDE 0.9 % IJ SOLN: 3.0000 mL | Freq: Two times a day (BID) | INTRAMUSCULAR | Status: DC

## 2012-08-08 MED ORDER — ASPIRIN 81 MG PO CHEW: 81.0000 mg | CHEWABLE_TABLET | Freq: Every day | ORAL | Status: DC

## 2012-08-08 MED ORDER — MIDAZOLAM HCL 2 MG/2ML IJ SOLN
INTRAMUSCULAR | Status: AC
  Filled 2012-08-08: qty 2

## 2012-08-08 MED ORDER — SODIUM CHLORIDE 0.9 % IJ SOLN: 3.0000 mL | INTRAMUSCULAR | Status: DC | PRN

## 2012-08-08 MED ORDER — SODIUM CHLORIDE 0.9 % IV SOLN
INTRAVENOUS | Status: DC
  Administered 2012-08-08: 09:00:00 via INTRAVENOUS

## 2012-08-08 MED ORDER — BIVALIRUDIN 250 MG IV SOLR
INTRAVENOUS | Status: AC
  Filled 2012-08-08: qty 250

## 2012-08-08 MED ORDER — ASPIRIN 81 MG PO CHEW
CHEWABLE_TABLET | ORAL | Status: AC
  Filled 2012-08-08: qty 4

## 2012-08-08 MED ORDER — ONDANSETRON HCL 4 MG/2ML IJ SOLN: 4.0000 mg | Freq: Four times a day (QID) | INTRAMUSCULAR | Status: DC | PRN

## 2012-08-08 MED ORDER — ACETAMINOPHEN 325 MG PO TABS: 650.0000 mg | ORAL_TABLET | ORAL | Status: DC | PRN

## 2012-08-08 MED ORDER — FENTANYL CITRATE 0.05 MG/ML IJ SOLN
INTRAMUSCULAR | Status: AC
  Filled 2012-08-08: qty 2

## 2012-08-08 MED ORDER — SODIUM CHLORIDE 0.9 % IV SOLN: 1.0000 mL/kg/h | INTRAVENOUS | Status: DC

## 2012-08-08 MED ORDER — HEPARIN (PORCINE) IN NACL 2-0.9 UNIT/ML-% IJ SOLN
INTRAMUSCULAR | Status: AC
  Filled 2012-08-08: qty 1000

## 2012-08-08 MED ORDER — SODIUM CHLORIDE 0.9 % IV SOLN: 250.0000 mL | INTRAVENOUS | Status: DC | PRN

## 2012-08-08 MED ORDER — VERAPAMIL HCL 2.5 MG/ML IV SOLN
INTRAVENOUS | Status: AC
  Filled 2012-08-08: qty 2

## 2012-08-08 MED ORDER — ASPIRIN 81 MG PO CHEW
324.0000 mg | CHEWABLE_TABLET | ORAL | Status: AC
  Administered 2012-08-08: 324 mg via ORAL

## 2012-08-08 MED ORDER — NITROGLYCERIN 1 MG/10 ML FOR IR/CATH LAB
INTRA_ARTERIAL | Status: AC
  Filled 2012-08-08: qty 10

## 2012-08-08 MED ORDER — LIDOCAINE HCL (PF) 1 % IJ SOLN
INTRAMUSCULAR | Status: AC
  Filled 2012-08-08: qty 30

## 2012-08-08 NOTE — CV Procedure (Signed)
Procedure performed:  Left heart catheterization including hemodynamic monitoring of the left ventricle, LV gram, selective right and left coronary arteriography.  Indication patient is a 72 year-old female with history of hypertension,  hyperlipidemia,  Diabetes Mellitus   who presents with chest pain. Patient has  had non invasive testing which was abnormal showing inferior ischemia.  Hence is brought to the cardiac catheterization lab to evaluate the  coronary anatomy for definitive diagnosis of CAD.  Hemodynamic data:  Left ventricular pressure was 137/4 with LVEDP of 11 mm mercury. Aortic pressure was 136/58 with a mean of 88 mm mercury. There was no pressure gradient across the aortic valve  Left ventricle: Performed in the RAO projection revealed LVEF of 55%. Unable to determine  MR. Due to hand injection and obesity  Right coronary artery: The vessel is  Dominant. Large caliber vessel with a proximal 20-30% stenosis at the origin of the RV branch, divides into a large PDA and PL branch at the crux, with a PDA showing a 80% stenosis. There was a tandem 40% stenosis. Otherwise the rest of the vessel was smooth.  Left main coronary artery is large and normal.  Circumflex coronary artery: A large vessel giving origin to a large obtuse marginal 1. It is smooth and normal.   LAD:  LAD gives origin to a large diagonal-1.  LAD has very mild luminal irregularities.   Recommendation: I was planning on to proceed with intervention to the right coronary artery. However patient had no good IV access and a very small IV access that he had lost and unable to anticoagulate her. Hence given her diabetic state, morbid obesity, chronic renal insufficiency I decided to staged intervention and patient will be  back for staged PCI.  Technique: Under sterile precautions using a 6 French right radial  arterial access, a 6 French sheath was introduced into the right radial artery. A 5 [place] Tig 4 catheter was  advanced into the ascending aorta selective  right coronary artery and left coronary artery was cannulated and angiography was performed in multiple views. The catheter was pulled back Out of the body over exchange length J-wire. Same Catheter was used to perform LV gram which was performed in RAO projection. Catheter exchanged out of the body over J-Wire. NO immediate complications noted. Patient tolerated the procedure well.  Hemostasis was obtained by applying TR band. 80 cc of contrast was utilized for diagnostic angiography  Rec: Medical therapy with aggressive risk factor reduction.   Disposition: Will be discharged home today with outpatient follow up.

## 2012-08-08 NOTE — H&P (Signed)
  Please see paper chart  

## 2012-08-08 NOTE — Interval H&P Note (Signed)
History and Physical Interval Note:  08/08/2012 11:06 AM  Brittany Roberts  has presented today for surgery, with the diagnosis of Chest pain  The various methods of treatment have been discussed with the patient and family. After consideration of risks, benefits and other options for treatment, the patient has consented to  Procedure(s) (LRB) with comments: LEFT HEART CATHETERIZATION WITH CORONARY ANGIOGRAM (N/A) and possible angioplasty as a surgical intervention .  The patient's history has been reviewed, patient examined, no change in status, stable for surgery.  I have reviewed the patient's chart and labs.  Questions were answered to the patient's satisfaction.     [name]

## 2012-08-09 MED FILL — Dextrose Inj 5%: INTRAVENOUS | Qty: 50 | Status: AC

## 2012-08-10 ENCOUNTER — Encounter (HOSPITAL_COMMUNITY): Payer: Self-pay | Admitting: Pharmacy Technician

## 2012-08-10 ENCOUNTER — Encounter (HOSPITAL_COMMUNITY): Payer: Medicare Other

## 2012-08-15 ENCOUNTER — Encounter (HOSPITAL_COMMUNITY): Payer: Medicare Other

## 2012-08-17 ENCOUNTER — Encounter (HOSPITAL_COMMUNITY): Payer: Medicare Other

## 2012-08-21 ENCOUNTER — Encounter (HOSPITAL_COMMUNITY)
Admission: RE | Disposition: A | Discharge status: Home / Self Care | Payer: Self-pay | Source: Ambulatory Visit | Attending: Cardiology

## 2012-08-21 ENCOUNTER — Ambulatory Visit (HOSPITAL_COMMUNITY)
Admission: RE | Admit: 2012-08-21 | Discharge: 2012-08-22 | Disposition: A | Discharge status: Home / Self Care | Payer: Medicare Other | Source: Ambulatory Visit | Attending: Cardiology | Admitting: Cardiology

## 2012-08-21 DIAGNOSIS — E119 Type 2 diabetes mellitus without complications: Secondary | ICD-10-CM | POA: Insufficient documentation

## 2012-08-21 DIAGNOSIS — J449 Chronic obstructive pulmonary disease, unspecified: Secondary | ICD-10-CM | POA: Insufficient documentation

## 2012-08-21 DIAGNOSIS — J4489 Other specified chronic obstructive pulmonary disease: Secondary | ICD-10-CM | POA: Insufficient documentation

## 2012-08-21 DIAGNOSIS — I2510 Atherosclerotic heart disease of native coronary artery without angina pectoris: Principal | ICD-10-CM | POA: Insufficient documentation

## 2012-08-21 DIAGNOSIS — E785 Hyperlipidemia, unspecified: Secondary | ICD-10-CM | POA: Insufficient documentation

## 2012-08-21 DIAGNOSIS — I509 Heart failure, unspecified: Secondary | ICD-10-CM | POA: Insufficient documentation

## 2012-08-21 DIAGNOSIS — I10 Essential (primary) hypertension: Secondary | ICD-10-CM | POA: Insufficient documentation

## 2012-08-21 HISTORY — PX: PERCUTANEOUS CORONARY STENT INTERVENTION (PCI-S): SHX5485

## 2012-08-21 LAB — BASIC METABOLIC PANEL
BUN: 16 mg/dL (ref 6–23)
CO2: 25 mEq/L (ref 19–32)
Calcium: 9.3 mg/dL (ref 8.4–10.5)
Chloride: 103 mEq/L (ref 96–112)
Creatinine, Ser: 1.07 mg/dL (ref 0.50–1.10)
GFR calc Af Amer: 59 mL/min — ABNORMAL LOW (ref >90)
GFR calc non Af Amer: 51 mL/min — ABNORMAL LOW (ref >90)
Glucose, Bld: 117 mg/dL — ABNORMAL HIGH (ref 70–99)
Potassium: 7.4 mEq/L (ref 3.5–5.1)
Sodium: 136 mEq/L (ref 135–145)

## 2012-08-21 LAB — GLUCOSE, CAPILLARY
Glucose-Capillary: 116 mg/dL — ABNORMAL HIGH (ref 70–99)
Glucose-Capillary: 107 mg/dL — ABNORMAL HIGH (ref 70–99)
Glucose-Capillary: 130 mg/dL — ABNORMAL HIGH (ref 70–99)
Glucose-Capillary: 130 mg/dL — ABNORMAL HIGH (ref 70–99)
Glucose-Capillary: 120 mg/dL — ABNORMAL HIGH (ref 70–99)
Glucose-Capillary: 184 mg/dL — ABNORMAL HIGH (ref 70–99)

## 2012-08-21 LAB — POCT ACTIVATED CLOTTING TIME: Activated Clotting Time: 442 seconds

## 2012-08-21 SURGERY — PERCUTANEOUS CORONARY STENT INTERVENTION (PCI-S)
Anesthesia: LOCAL

## 2012-08-21 MED ORDER — PANTOPRAZOLE SODIUM 40 MG PO TBEC
40.0000 mg | DELAYED_RELEASE_TABLET | Freq: Every day | ORAL | Status: DC
  Administered 2012-08-22: 40 mg via ORAL
  Filled 2012-08-21: qty 1

## 2012-08-21 MED ORDER — ROSUVASTATIN CALCIUM 10 MG PO TABS
10.0000 mg | ORAL_TABLET | Freq: Every day | ORAL | Status: DC
  Administered 2012-08-21: 17:00:00 10 mg via ORAL
  Filled 2012-08-21 (×2): qty 1

## 2012-08-21 MED ORDER — FERROUS SULFATE 325 (65 FE) MG PO TABS
325.0000 mg | ORAL_TABLET | Freq: Two times a day (BID) | ORAL | Status: DC
  Administered 2012-08-21 – 2012-08-22 (×2): 325 mg via ORAL
  Filled 2012-08-21 (×3): qty 1

## 2012-08-21 MED ORDER — FUROSEMIDE 20 MG PO TABS
20.0000 mg | ORAL_TABLET | Freq: Every day | ORAL | Status: DC
  Administered 2012-08-22: 11:00:00 20 mg via ORAL
  Filled 2012-08-21: qty 1

## 2012-08-21 MED ORDER — ESCITALOPRAM OXALATE 20 MG PO TABS
20.0000 mg | ORAL_TABLET | Freq: Every day | ORAL | Status: DC
Start: 2012-08-22 — End: 2012-08-22
  Administered 2012-08-22: 11:00:00 20 mg via ORAL
  Filled 2012-08-21: qty 1

## 2012-08-21 MED ORDER — GABAPENTIN 600 MG PO TABS
600.0000 mg | ORAL_TABLET | Freq: Three times a day (TID) | ORAL | Status: DC
  Administered 2012-08-21 – 2012-08-22 (×3): 600 mg via ORAL
  Filled 2012-08-21 (×5): qty 1

## 2012-08-21 MED ORDER — FLUTICASONE PROPIONATE HFA 220 MCG/ACT IN AERO
1.0000 | INHALATION_SPRAY | Freq: Two times a day (BID) | RESPIRATORY_TRACT | Status: DC
  Administered 2012-08-21 – 2012-08-22 (×2): 1 via RESPIRATORY_TRACT
  Filled 2012-08-21: qty 12

## 2012-08-21 MED ORDER — SODIUM CHLORIDE 0.9 % IV SOLN
INTRAVENOUS | Status: DC
  Administered 2012-08-21 (×2): via INTRAVENOUS

## 2012-08-21 MED ORDER — PRASUGREL HCL 10 MG PO TABS
10.0000 mg | ORAL_TABLET | Freq: Every day | ORAL | Status: DC
  Administered 2012-08-22: 10 mg via ORAL
  Filled 2012-08-21: qty 1

## 2012-08-21 MED ORDER — HEPARIN (PORCINE) IN NACL 2-0.9 UNIT/ML-% IJ SOLN
INTRAMUSCULAR | Status: AC
  Filled 2012-08-21: qty 1000

## 2012-08-21 MED ORDER — TRAZODONE HCL 50 MG PO TABS
50.0000 mg | ORAL_TABLET | Freq: Every day | ORAL | Status: DC
  Administered 2012-08-21: 21:00:00 50 mg via ORAL
  Filled 2012-08-21 (×2): qty 1

## 2012-08-21 MED ORDER — TRAMADOL HCL 50 MG PO TABS
50.0000 mg | ORAL_TABLET | Freq: Three times a day (TID) | ORAL | Status: DC
  Administered 2012-08-21 – 2012-08-22 (×3): 50 mg via ORAL
  Filled 2012-08-21 (×5): qty 1

## 2012-08-21 MED ORDER — BIVALIRUDIN 250 MG IV SOLR
INTRAVENOUS | Status: AC
  Filled 2012-08-21: qty 500

## 2012-08-21 MED ORDER — PRASUGREL HCL 10 MG PO TABS
60.0000 mg | ORAL_TABLET | Freq: Once | ORAL | Status: AC
  Administered 2012-08-21: 60 mg via ORAL
  Filled 2012-08-21 (×2): qty 6

## 2012-08-21 MED ORDER — SIMVASTATIN 40 MG PO TABS: 40.0000 mg | ORAL_TABLET | Freq: Every evening | ORAL | Status: DC

## 2012-08-21 MED ORDER — FAMOTIDINE IN NACL 20-0.9 MG/50ML-% IV SOLN
INTRAVENOUS | Status: AC
  Filled 2012-08-21: qty 50

## 2012-08-21 MED ORDER — SODIUM CHLORIDE 0.9 % IV SOLN
1.0000 mL/kg/h | INTRAVENOUS | Status: AC
  Administered 2012-08-21: 1 mL/kg/h via INTRAVENOUS

## 2012-08-21 MED ORDER — LIDOCAINE HCL (PF) 1 % IJ SOLN
INTRAMUSCULAR | Status: AC
  Filled 2012-08-21: qty 30

## 2012-08-21 MED ORDER — METOPROLOL TARTRATE 12.5 MG HALF TABLET
12.5000 mg | ORAL_TABLET | Freq: Two times a day (BID) | ORAL | Status: DC
  Administered 2012-08-21 – 2012-08-22 (×3): 12.5 mg via ORAL
  Filled 2012-08-21 (×4): qty 1

## 2012-08-21 MED ORDER — ONDANSETRON HCL 4 MG/2ML IJ SOLN
4.0000 mg | Freq: Four times a day (QID) | INTRAMUSCULAR | Status: DC | PRN
  Administered 2012-08-22: 4 mg via INTRAVENOUS
  Filled 2012-08-21: qty 2

## 2012-08-21 MED ORDER — INSULIN NPH (HUMAN) (ISOPHANE) 100 UNIT/ML ~~LOC~~ SUSP
30.0000 [IU] | Freq: Every day | SUBCUTANEOUS | Status: DC
  Administered 2012-08-22: 09:00:00 30 [IU] via SUBCUTANEOUS
  Filled 2012-08-21: qty 10

## 2012-08-21 MED ORDER — SODIUM CHLORIDE 0.9 % IV SOLN: 250.0000 mL | INTRAVENOUS | Status: DC | PRN

## 2012-08-21 MED ORDER — MIDAZOLAM HCL 2 MG/2ML IJ SOLN
INTRAMUSCULAR | Status: AC
  Filled 2012-08-21: qty 2

## 2012-08-21 MED ORDER — SPIRONOLACTONE 25 MG PO TABS: 25.0000 mg | ORAL_TABLET | Freq: Two times a day (BID) | ORAL | Status: DC

## 2012-08-21 MED ORDER — INSULIN ASPART 100 UNIT/ML ~~LOC~~ SOLN
0.0000 [IU] | Freq: Three times a day (TID) | SUBCUTANEOUS | Status: DC
  Administered 2012-08-22: 2 [IU] via SUBCUTANEOUS

## 2012-08-21 MED ORDER — METOCLOPRAMIDE HCL 10 MG PO TABS
10.0000 mg | ORAL_TABLET | Freq: Four times a day (QID) | ORAL | Status: DC
  Administered 2012-08-21 – 2012-08-22 (×3): 10 mg via ORAL
  Filled 2012-08-21 (×7): qty 1

## 2012-08-21 MED ORDER — SODIUM CHLORIDE 0.9 % IV SOLN
1.7500 mg/kg/h | INTRAVENOUS | Status: DC
  Administered 2012-08-21: 1.75 mg/kg/h via INTRAVENOUS
  Filled 2012-08-21: qty 250

## 2012-08-21 MED ORDER — FENTANYL CITRATE 0.05 MG/ML IJ SOLN
INTRAMUSCULAR | Status: AC
  Filled 2012-08-21: qty 2

## 2012-08-21 MED ORDER — SODIUM CHLORIDE 0.9 % IJ SOLN: 3.0000 mL | INTRAMUSCULAR | Status: DC | PRN

## 2012-08-21 MED ORDER — SODIUM CHLORIDE 0.9 % IJ SOLN: 3.0000 mL | Freq: Two times a day (BID) | INTRAMUSCULAR | Status: DC

## 2012-08-21 MED ORDER — ALBUTEROL SULFATE HFA 108 (90 BASE) MCG/ACT IN AERS
2.0000 | INHALATION_SPRAY | RESPIRATORY_TRACT | Status: DC | PRN
  Filled 2012-08-21: qty 6.7

## 2012-08-21 MED ORDER — ACETAMINOPHEN 325 MG PO TABS: 650.0000 mg | ORAL_TABLET | ORAL | Status: DC | PRN

## 2012-08-21 MED ORDER — PRASUGREL HCL 10 MG PO TABS
ORAL_TABLET | ORAL | Status: AC
  Filled 2012-08-21: qty 6

## 2012-08-21 MED ORDER — FLUTICASONE PROPIONATE 50 MCG/ACT NA SUSP
1.0000 | Freq: Every day | NASAL | Status: DC
  Filled 2012-08-21: qty 16

## 2012-08-21 MED ORDER — LEVOTHYROXINE SODIUM 75 MCG PO TABS
75.0000 ug | ORAL_TABLET | Freq: Every day | ORAL | Status: DC
  Administered 2012-08-22: 75 ug via ORAL
  Filled 2012-08-21 (×2): qty 1

## 2012-08-21 MED ORDER — NITROGLYCERIN 1 MG/10 ML FOR IR/CATH LAB
INTRA_ARTERIAL | Status: AC
  Filled 2012-08-21: qty 10

## 2012-08-21 MED ORDER — AMLODIPINE BESYLATE 10 MG PO TABS
10.0000 mg | ORAL_TABLET | Freq: Every day | ORAL | Status: DC
  Administered 2012-08-22: 10 mg via ORAL
  Filled 2012-08-21: qty 1

## 2012-08-21 MED ORDER — ASPIRIN 81 MG PO CHEW
CHEWABLE_TABLET | ORAL | Status: AC
  Filled 2012-08-21: qty 4

## 2012-08-21 MED ORDER — ASPIRIN 81 MG PO CHEW
324.0000 mg | CHEWABLE_TABLET | ORAL | Status: AC
  Administered 2012-08-21: 324 mg via ORAL

## 2012-08-21 MED ORDER — ISOSORBIDE MONONITRATE ER 60 MG PO TB24
60.0000 mg | ORAL_TABLET | Freq: Every day | ORAL | Status: DC
  Administered 2012-08-22: 60 mg via ORAL
  Filled 2012-08-21 (×2): qty 1

## 2012-08-21 MED ORDER — FAMOTIDINE IN NACL 20-0.9 MG/50ML-% IV SOLN
20.0000 mg | Freq: Once | INTRAVENOUS | Status: AC
  Administered 2012-08-21: 20 mg via INTRAVENOUS

## 2012-08-21 NOTE — CV Procedure (Signed)
Procedure performed:  Elective PTCA and stenting of the PDA of RCA with 2.5x8 Xience Xpedition DES.  Indication: Patient is a 72 year-old female with history of hypertension,  hyperlipidemia,  Diabetes Mellitus   who presents with Chest pain. Patient has  had non invasive testing which was abnormal revealing inferior ischemia. Underwent diagnostic cardiac catheterization 2 weeks ago, was found to have high-grade stenosis of the PDA branch of the RCA. Due to technical issues she was scheduled for a staged intervention to the RCA. She did not have IV access.    Hemodynamic data: Aortic pressure: 145/67/95 mm Hg.  On 08/08/12 Coronary angiography had shown:  Right coronary artery: Has anterior origin. The vessel is Dominant. Large caliber vessel with a proximal 20-30% stenosis at the origin of the RV branch, divides into a large PDA and PL branch at the crux, with a PDA showing a 80% stenosis.  Otherwise the rest of the vessel was smooth.  Left main coronary artery is large and normal.  Circumflex coronary artery: A large vessel giving origin to a large obtuse marginal 1. It is smooth and normal.  LAD: LAD gives origin to a large diagonal-1. LAD has very mild luminal irregularities  Interventional data: Successful PTCA and stenting of the  PDA of RCA with 2.5x8 Xience Xpedition DES. Stenosis reduced from 90% to 0% with brisk TIMI-3 to TIMI-3 flow maintained at end of the procedure. Will need Dual antiplatelet therapy with Effient and ASA 81 mg for at least 1 year. The procedure itself was very complex do to proximal tortuosity and in spite of use of guideline or and double wire technique, I had difficulty in advancing the stent to the site of the stenosis. Once the stent was in place, the lesion was occlusive.   Technique of intervention:  Using a 6 [place] JR 4 guide catheter the RCA  coronary  was selected and cannulated. Using Angiomax for anticoagulation, I utilized a Runthrough guidewire and across  the RCA without any difficulty. I placed the tip of the wire into the distal  coronary artery. Angiography was performed. I had mild amount of difficulty in engaging the right coronary artery which had anterior origin. I initially attempted to stent is directly with a 2.5 x 16 mm promos drug-eluting stent. Due to inability to take the stent at the site of the midsegment of the RCA which had an acute bend and probably has mild intramyocardial bridging, the stent was withdrawn and I attempted to  cross the stenosis with the help of a Ironman guidewire which was placed along with the run through guidewire as a buddy wire. In spite of this I had difficulty in advancing the stent. Then I utilized a guideline or with the help of a 2.5 x 8 millimeter [place] treck balloon. The Guideliner could not cross the proximal tortuosity. I withdrew the Runthrough guide wire. In spite of balloon being inflated at the site of the stenosis at 6 atmospheric pressure I was unable to cross the proximal tortuosity. At this point I withdrew the noncompliant balloon out of the body  and advanced the Xience expedition 2.5 x 8 mm stent to the site of the stenosis and deployed at 12 atmospheric pressure for 24 seconds. I reconfirmed this by angiography and reinflated for the second time at 18 atmospheric pressure for 60 seconds. Following this intracoronary nitroglycerin was administered a previous day this throughout the procedure. Angiography revealed 0% residual stenosis. The guidewire and the guidewire first withdrawn  out of the body and angiography was performed. Excellent result was evident. In the midsegment of the RCA, there was no significant stenosis. This was confirmed in multiple views. The guide catheter was then disengaged and pulled out of the body over a Versacore wire. Hemostasis was obtained by applying TR band. Patient tolerated the procedure well. There was no immediate competitions. 145 cc of contrast was utilized for  interventional procedure. Disposition: Patient will be discharged in AM unless complications with out-patient follow up.

## 2012-08-21 NOTE — H&P (View-Only) (Signed)
  Please see paper chart  

## 2012-08-21 NOTE — Progress Notes (Signed)
TR BAND REMOVAL  LOCATION:    right radial  DEFLATED PER PROTOCOL:    yes  TIME BAND OFF / DRESSING APPLIED:    1300   SITE UPON ARRIVAL:    Level 0  SITE AFTER BAND REMOVAL:    Level 0  REVERSE ALLEN'S TEST:     positive  CIRCULATION SENSATION AND MOVEMENT:    Within Normal Limits   yes  COMMENTS:   Tolerated procedure well

## 2012-08-21 NOTE — Interval H&P Note (Signed)
History and Physical Interval Note:  08/21/2012 7:46 AM  Brittany Roberts  has presented today for surgery, with the diagnosis of cad  The various methods of treatment have been discussed with the patient and family. After consideration of risks, benefits and other options for treatment, the patient has consented to  Procedure(s): PERCUTANEOUS CORONARY STENT INTERVENTION (PCI-S) (N/A) as a surgical intervention .  The patient's history has been reviewed, patient examined, no change in status, stable for surgery.  I have reviewed the patient's chart and labs.  Questions were answered to the patient's satisfaction.     [name]

## 2012-08-21 NOTE — Progress Notes (Signed)
Utilization Review Completed Prashant Glosser J. Lagena Strand, RN, BSN, NCM [phone]  

## 2012-08-22 ENCOUNTER — Encounter (HOSPITAL_COMMUNITY): Payer: Medicare Other

## 2012-08-22 ENCOUNTER — Encounter (HOSPITAL_COMMUNITY): Payer: Self-pay | Admitting: *Deleted

## 2012-08-22 LAB — BASIC METABOLIC PANEL
BUN: 18 mg/dL (ref 6–23)
BUN: 20 mg/dL (ref 6–23)
CO2: 26 mEq/L (ref 19–32)
CO2: 25 mEq/L (ref 19–32)
Calcium: 9.3 mg/dL (ref 8.4–10.5)
Calcium: 9.3 mg/dL (ref 8.4–10.5)
Chloride: 106 mEq/L (ref 96–112)
Chloride: 103 mEq/L (ref 96–112)
Creatinine, Ser: 1.5 mg/dL — ABNORMAL HIGH (ref 0.50–1.10)
Creatinine, Ser: 1.37 mg/dL — ABNORMAL HIGH (ref 0.50–1.10)
GFR calc Af Amer: 39 mL/min — ABNORMAL LOW (ref >90)
GFR calc Af Amer: 43 mL/min — ABNORMAL LOW (ref >90)
GFR calc non Af Amer: 34 mL/min — ABNORMAL LOW (ref >90)
GFR calc non Af Amer: 38 mL/min — ABNORMAL LOW (ref >90)
Glucose, Bld: 134 mg/dL — ABNORMAL HIGH (ref 70–99)
Glucose, Bld: 98 mg/dL (ref 70–99)
Potassium: 4.4 mEq/L (ref 3.5–5.1)
Potassium: 4.3 mEq/L (ref 3.5–5.1)
Sodium: 142 mEq/L (ref 135–145)
Sodium: 139 mEq/L (ref 135–145)

## 2012-08-22 LAB — GLUCOSE, CAPILLARY
Glucose-Capillary: 145 mg/dL — ABNORMAL HIGH (ref 70–99)
Glucose-Capillary: 46 mg/dL — ABNORMAL LOW (ref 70–99)
Glucose-Capillary: 97 mg/dL (ref 70–99)

## 2012-08-22 LAB — CBC
HCT: 33.4 % — ABNORMAL LOW (ref 36.0–46.0)
Hemoglobin: 11.6 g/dL — ABNORMAL LOW (ref 12.0–15.0)
MCH: 24.8 pg — ABNORMAL LOW (ref 26.0–34.0)
MCHC: 34.7 g/dL (ref 30.0–36.0)
MCV: 71.5 fL — ABNORMAL LOW (ref 78.0–100.0)
Platelets: 218 10*3/uL (ref 150–400)
RBC: 4.67 MIL/uL (ref 3.87–5.11)
RDW: 13.6 % (ref 11.5–15.5)
WBC: 10.6 10*3/uL — ABNORMAL HIGH (ref 4.0–10.5)

## 2012-08-22 MED ORDER — PRASUGREL HCL 10 MG PO TABS: 10.0000 mg | ORAL_TABLET | Freq: Every day | ORAL | Status: DC

## 2012-08-22 MED ORDER — ASPIRIN 81 MG PO CHEW: 81.0000 mg | CHEWABLE_TABLET | Freq: Every day | ORAL | Status: DC

## 2012-08-22 MED ORDER — METOPROLOL TARTRATE 12.5 MG HALF TABLET: 12.5000 mg | ORAL_TABLET | Freq: Two times a day (BID) | ORAL | Status: DC

## 2012-08-22 MED ORDER — ASPIRIN 81 MG PO TABS: 81.0000 mg | ORAL_TABLET | Freq: Every day | ORAL | Status: DC

## 2012-08-22 MED FILL — Dextrose Inj 5%: INTRAVENOUS | Qty: 50 | Status: AC

## 2012-08-22 NOTE — Care Management Note (Unsigned)
    Page 1 of 1   08/22/2012     4:12:45 PM   CARE MANAGEMENT NOTE 08/22/2012  Patient:  Brittany Roberts, Brittany Roberts   Account Number:  [id number]  Date Initiated:  08/22/2012  Documentation initiated by:  [facility]  Subjective/Objective Assessment:   72 yo female presented today for surgery, with the diagnosis of cad     Action/Plan:   Home alone with [facility] aide from [facility]   Anticipated DC Date:  08/22/2012   Anticipated DC Plan:  [place]  CM consult      Choice offered to / List presented to:  C-1 Patient        [place] arranged  HH-1 RN  [place].   Status of service:  Completed, signed off Medicare Important Message given?   (If response is "NO", the following Medicare IM given date fields will be blank) Date Medicare IM given:   Date Additional Medicare IM given:    Discharge Disposition:  [place]  Per UR Regulation:  Reviewed for med. necessity/level of care/duration of stay  If discussed at [place] of Stay Meetings, dates discussed:    Comments:  08/22/12.Marland KitchenMarland KitchenFuller Mandril, RN, BSN, [employer] [phone] Spoke with pt regard receiving [facility] for disease management. Pt says she uses [facility] for [facility] needs.  Presented Pt and family with list and they chose [facility] to render RN services.    Butch Penny of [facility] notified.

## 2012-08-22 NOTE — Progress Notes (Signed)
Pt received Effient 30day free card.  Confirmed with pt pharmacy  ([facility] on [address]) that Effient was in stock.  Pt notified.

## 2012-08-22 NOTE — Progress Notes (Signed)
CARDIAC REHAB PHASE I   PRE:  Rate/Rhythm: 74SR  BP:  Supine: 165/73  Sitting:   Standing:    SaO2:   MODE:  Ambulation: 100 ft   POST:  Rate/Rhythem: 100SR PVCs  BP:  Supine:   Sitting: 166/75  Standing:    SaO2:  0835-0920 Pt walks little in the house. Walked 100 ft with her own rollator and asst x 1. Also assisted pt to bathroom. Denied CP. To chair after walk. Family in room. Education completed. Did not discuss CRP 2 due to mobility issues. Encouraged walking as tolerated but no written ex ed given due to mobility concerns. Gave pt diabetic and heart healthy diets.  Brittany Roberts

## 2012-08-22 NOTE — Discharge Summary (Signed)
Physician Discharge Summary  Patient ID: THIDA BARRACLOUGH MRN: [id number] DOB/AGE: 01-11-1941 72 y.o.  Admit date: 08/21/2012 Discharge date: 08/22/2012  Primary Discharge Diagnosis CAD S/P PTCA:  Secondary Discharge Diagnosis DM II uncontrolled. Hypertension Chronic renal insufficiency Hypertension. Hyperlipidemia Morbid obesity.  Significant Diagnostic Studies: 08/21/2012: Patient scheduled for elective coronray angioplasty after a diagnostic angiogram on 08/08/12 Coronary angiography had shown:   Right coronary artery: Has anterior origin. The vessel is Dominant. Large caliber vessel with a proximal 20-30% stenosis at the origin of the RV branch, divides into a large PDA and PL branch at the crux, with a PDA showing a 80% stenosis. Otherwise the rest of the vessel was smooth.  Left main coronary artery is large and normal.  Circumflex coronary artery: A large vessel giving origin to a large obtuse marginal 1. It is smooth and normal.  LAD: LAD gives origin to a large diagonal-1. LAD has very mild luminal irregularities    08/21/12: Interventional data: Successful PTCA and stenting of the PDA of RCA with 2.5x8 Xience Xpedition DES. Stenosis reduced from 90% to 0%    Consults:   Hospital Course: patient was admitted on elective fashion for coronary angioplasty.  The right coronary artery although appeared to be simple, turned out to be a fairly complex angioplasty.  The following day her serum creatinine had risen to 1.5.  Hence I repeated BMP the same afternoon, and it was trending down at 1.3.  Patient had brief 4-6 beat runs of wide complex tachycardia which appeared to be NSVT postprocedure, asymptomatic and since it was less than 24 hours after angioplasty, no further evaluation is indicated unless she develops new symptoms or EKG abnormalities.  I will be seeing her back in the office in about 10 days.   Recommendations on discharge:  Will need Dual antiplatelet therapy with Effient and  ASA 81 mg for at least 1 year. Needs f/u BMP and consider starting ACEi instead of aldactone.   Discharge Exam: Blood pressure 148/60, pulse 60, temperature 98.3 F (36.8 C), temperature source Oral, resp. rate 18, height 5\' 3"  (1.6 m), weight 131.3 kg (289 lb 7.4 oz), SpO2 97.00%.    General appearance: alert, cooperative, appears stated age, no distress and morbidly obese Resp: clear to auscultation bilaterally Chest wall: no tenderness Cardio: regular rate and rhythm, S1, S2 normal, no murmur, click, rub or gallop GI: soft, non-tender; bowel sounds normal; no masses,  no organomegaly Extremities: extremities normal, atraumatic, no cyanosis or edema Incision/Wound: Right radial access site without hematoma. Good pulse Labs:   Lab Results  Component Value Date   WBC 10.6* 08/22/2012   HGB 11.6* 08/22/2012   HCT 33.4* 08/22/2012   MCV 71.5* 08/22/2012   PLT 218 08/22/2012    Recent Labs Lab 08/22/12 1343  NA 139  K 4.3  CL 103  CO2 25  BUN 20  CREATININE 1.37*  CALCIUM 9.3  GLUCOSE 98   No results found for this basename: CKTOTAL, CKMB, CKMBINDEX, TROPONINI    Lipid Panel     Component Value Date/Time   CHOL 152 09/15/2011 1552   TRIG 65 09/15/2011 1552   HDL 45 09/15/2011 1552   CHOLHDL 3.4 09/15/2011 1552   VLDL 13 09/15/2011 1552   LDLCALC 94 09/15/2011 1552    EKG: 08/21/12: S. Bradycardia @ 57/min. LAE, Poor R progression cannot R/O ant infarct old. Diffuse non specific t flattening, unchanged from previous tracings. Tele Asymptomatic NSVT noted of 4-6 beats.  FOLLOW UP PLANS AND APPOINTMENTS  Future Appointments Provider Department Dept Phone   08/24/2012 1:30 PM [facility] [place] [phone]   08/29/2012 1:30 PM [facility] [place] [phone]   08/31/2012 1:30 PM [facility] [place] [phone]    09/05/2012 1:30 PM [facility] [place] [phone]   09/07/2012 1:30 PM [facility] [place] [phone]   09/12/2012 1:30 PM [facility] [place] [id number]   09/14/2012 1:30 PM [facility] [place] [id number]   09/19/2012 1:30 PM [facility] [place] [phone]   09/21/2012 1:30 PM [facility] [place] [phone]   09/26/2012 1:30 PM [facility] [place] [phone]   09/27/2012 1:30 PM Jyl Heinz, MD [facility] [phone]   09/28/2012 1:30 PM [facility] [phone]       Medication List    STOP taking these medications       spironolactone 25 MG tablet  Commonly known as:  ALDACTONE      TAKE these medications       albuterol 108 (90 BASE) MCG/ACT inhaler  Commonly known as:  PROAIR HFA  Inhale 2 puffs into the lungs every 4 (four) hours as needed for wheezing. Use only when you are wheezing     amLODipine 10 MG tablet  Commonly known as:  NORVASC  Take 10 mg by mouth daily.     aspirin 81 MG tablet  Take 1 tablet (81 mg total) by mouth daily.     B-D INS SYRINGE 0.5CC/30GX1/2" 30G X 1/2" 0.5 ML Misc  Generic drug:  Insulin Syringe-Needle U-100  use as directed     BIOTIN 5000 5 MG Caps  Generic drug:  Biotin  Take 1 capsule by mouth 2 (two) times daily.     BLACK CURRANT SEED OIL PO  Take 1 capsule by mouth daily.     escitalopram 20 MG tablet  Commonly known as:  LEXAPRO  Take 20 mg by mouth daily.     esomeprazole 40 MG capsule  Commonly known as:  NEXIUM  Take 40 mg by mouth daily before breakfast.     ferrous sulfate 325 (65 FE)  MG tablet  Take 325 mg by mouth 2 (two) times daily.     fluticasone 220 MCG/ACT inhaler  Commonly known as:  FLOVENT HFA  Inhale 1 puff into the lungs 2 (two) times daily. Use even when feeling well     furosemide 20 MG tablet  Commonly known as:  LASIX  Take 20 mg by mouth daily.     gabapentin 600 MG tablet  Commonly known as:  NEURONTIN  Take 600 mg by mouth 3 (three) times daily.     insulin NPH 100 UNIT/ML injection  Commonly known as:  HUMULIN N,NOVOLIN N  Inject 30 Units into the skin daily.     isosorbide mononitrate 60 MG 24 hr tablet  Commonly known as:  IMDUR  Take 60 mg by mouth daily.     levothyroxine 75 MCG tablet  Commonly known as:  SYNTHROID, LEVOTHROID  Take 75 mcg by mouth daily.     metoCLOPramide 10 MG tablet  Commonly known as:  REGLAN  Take 10 mg by mouth 4 (four) times daily.     metoprolol tartrate 12.5 mg Tabs  Commonly known as:  LOPRESSOR  Take 0.5 tablets (12.5 mg total) by mouth 2 (two) times daily.     Minerin Crea  Apply twice a day to dry skin     mometasone 50 MCG/ACT nasal spray  Commonly known as:  NASONEX  Place 2 sprays into the nose daily.     OVER THE COUNTER MEDICATION  Take 1 tablet by mouth 2 (two) times daily. Flexamin     prasugrel 10 MG Tabs  Commonly known as:  EFFIENT  Take 1 tablet (10 mg total) by mouth daily.     PRODIGY NO CODING BLOOD GLUC test strip  Generic drug:  glucose blood  TEST once daily     simvastatin 40 MG tablet  Commonly known as:  ZOCOR  Take 40 mg by mouth every evening.     SYSTANE OP  Place 1 drop into both eyes 4 (four) times daily.     traMADol 50 MG tablet  Commonly known as:  ULTRAM  Take 50 mg by mouth 3 (three) times daily.     traZODone 50 MG tablet  Commonly known as:  DESYREL  Take 50 mg by mouth at bedtime.           Follow-up Information   Follow up with [name], MD. (Keep previous appointment)    Contact information:   [address] [place] [address] [phone]        [name], MD 08/22/2012, 7:27 PM  Pager: [phone] Office: [phone] If no answer: [phone]

## 2012-08-24 ENCOUNTER — Encounter (HOSPITAL_COMMUNITY): Payer: Medicare Other

## 2012-08-29 ENCOUNTER — Encounter (HOSPITAL_COMMUNITY): Payer: Medicare Other

## 2012-08-31 ENCOUNTER — Encounter (HOSPITAL_COMMUNITY): Payer: Medicare Other

## 2012-09-05 ENCOUNTER — Encounter (HOSPITAL_COMMUNITY): Payer: Medicare Other

## 2012-09-07 ENCOUNTER — Encounter (HOSPITAL_COMMUNITY): Payer: Medicare Other

## 2012-09-09 ENCOUNTER — Other Ambulatory Visit: Payer: Self-pay | Admitting: Family Medicine

## 2012-09-12 ENCOUNTER — Other Ambulatory Visit: Payer: Self-pay | Admitting: Family Medicine

## 2012-09-12 ENCOUNTER — Encounter (HOSPITAL_COMMUNITY): Payer: Medicare Other

## 2012-09-14 ENCOUNTER — Encounter (HOSPITAL_COMMUNITY): Payer: Medicare Other

## 2012-09-19 ENCOUNTER — Encounter (HOSPITAL_COMMUNITY): Payer: Medicare Other

## 2012-09-20 NOTE — Progress Notes (Signed)
PULMONARY REHAB OUTCOMES/PROGRESS REPORT  Called patient on 09/19/2012 and discussed her attendance. Staff knew she had health issues and needed a confirmation to come back but no action was taken. Patient knew her responsibility as far as compliance with attendance and returning phone calls. Patients was called on multiple occassions and patient stated "Im going to come back just need to take care of this and that first". Staff acknowledged. Patient was last seen on 07/25/2012 and that made it her 3rd exercise session. Patient turned in her pre quality of life packet and her scores were low. RN discussed with patient and set goals. Patient had goals to breathe better and lose weight to help with her ADLs. Patient had a low dyspnea score which is good and was easier to work with her as far as her pursed lip breathing. Patient did not complete enough sessions for staff to say she accomplished her PLB or goals. Patient completed a pre walk test and ambulated 275 ft on RA oxygen saturations maintained above 94% the whole 6 minute. Patient took several rest breaks due to leg soreness. Patient may return at a later time if medically necessary.

## 2012-09-21 ENCOUNTER — Encounter (HOSPITAL_COMMUNITY): Payer: Medicare Other

## 2012-09-26 ENCOUNTER — Encounter (HOSPITAL_COMMUNITY): Payer: Medicare Other

## 2012-09-27 ENCOUNTER — Encounter: Payer: Self-pay | Admitting: Pulmonary Disease

## 2012-09-27 ENCOUNTER — Ambulatory Visit (INDEPENDENT_AMBULATORY_CARE_PROVIDER_SITE_OTHER): Payer: Medicare Other | Admitting: Pulmonary Disease

## 2012-09-27 VITALS — BP 120/72 | HR 50 | Temp 97.9°F | Ht 63.0 in | Wt 284.2 lb

## 2012-09-27 DIAGNOSIS — R5381 Other malaise: Secondary | ICD-10-CM

## 2012-09-27 DIAGNOSIS — J45909 Unspecified asthma, uncomplicated: Secondary | ICD-10-CM

## 2012-09-27 DIAGNOSIS — E669 Obesity, unspecified: Secondary | ICD-10-CM

## 2012-09-27 DIAGNOSIS — J449 Chronic obstructive pulmonary disease, unspecified: Secondary | ICD-10-CM

## 2012-09-27 DIAGNOSIS — G4730 Sleep apnea, unspecified: Secondary | ICD-10-CM

## 2012-09-27 NOTE — Patient Instructions (Addendum)
Continue current medications. Use Flovent daily.  Please initiate [place] sinus rinses twice daily. Continue Nasonex.   We will schedule you to see Dr. Chase Caller in 3-4 months for routine follow up.   In the interim please call with any questions or health changes.

## 2012-09-28 ENCOUNTER — Encounter (HOSPITAL_COMMUNITY): Payer: Medicare Other

## 2012-09-28 NOTE — Progress Notes (Signed)
72 yo woman presents to clinic as a new consult requested by Dr. Vista Lawman for asthma management.     HPI 72 yo with complex PMH significant for Obesity, deconditioning, diastolic dysfunction, Discoid Lupus erythematous and  Asthma presents to the clinic for dyspnea. We do have spirometry available on file from 03/28/12 FEV1 76%, FVC 81%, FEV1/FVC 71. She reports minimal cough , her main complaint is dyspnea at rest and with exertion, currently is not wearing oxygen at rest but does wear nocturnal oxygen for sleep apnea; she reports no recent episodes of asthma exacerbation and visits to the ED or hospitalizations.   Today she denies acute problems with fever or chills; increased dyspnea or cough, sick contacts,  nasal congestion, significantly improved with Nasonex, denies sinus pressure or GERD, though is taking PPIs routinely. Denies hemoptysis and paroxysmal nocturnal dyspnea.    At previous visit she endorsed chest pain, intermittent,  without associated wheezing, cough non pleuritic, at rest and with exertion. She had seen Dr. Nadyne Coombes and underwent catheterization with PTCI of a 90% coronary artery lesion.    Past Medical History  Diagnosis Date  . Depression   . Lupus   . Asthma   . Diabetes mellitus   . Hypertension   . Hypothyroidism   . Congestive heart failure, unspecified   . Other and unspecified hyperlipidemia   . Unspecified vitamin D deficiency   . Renal failure, unspecified   . OSA (obstructive sleep apnea)     Past Surgical History  Procedure Laterality Date  . Tubal ligation    . Nasal sinus surgery    . Gallbladder surgery     Family History  Problem Relation Age of Onset  . Heart disease Mother   . Pneumonia Father   . Diabetes Sister   . Diabetes Sister   . Diabetes Mother   . Clotting disorder Mother   . Rheum arthritis Father   . Asthma Brother     History   Social History  . Marital Status: Single    Spouse Name: N/A    Number of Children: 4  .  Years of Education: N/A   Occupational History  . RetiredMedia planner    Social History Main Topics  . Smoking status: Former Smoker -- 3.00 packs/day for 20 years    Types: Cigarettes    Quit date: 07/06/1999  . Smokeless tobacco: Never Used  . Alcohol Use: No  . Drug Use: No  . Sexually Active: Not Currently   Other Topics Concern  . Not on file   Social History Narrative   Health Care POA:    Emergency Contact: daughter, Lorenso Courier [phone] or [name] [phone]   End of Life Plan: Does not want to be ventilated or feeding tubes.    Who lives with you: Lives with daughter Lorenso Courier   Any pets: none   Diet: Patient reports enjoying and eating junk food.  Does not regulate types of food and currently her dentures are broken so it is hard to eat some foods.   Exercise: Patient does not have an exercise plan.   Seatbelts: Patient reports wearing seatbelt when in vehicle.   Sun Exposure/Protection: Patient reports not going outside very often.   Hobbies: Patient enjoys reading the bible and watching game shows.           ROS  Constitutional: Negative for appetite change and unexpected weight change. Negative for fever.  HENT: Negative for ear pain, congestion, sore throat, sneezing and trouble  swallowing. Negative for dental problem.   Respiratory: per HPI   Cardiovascular: Negative for chest pain and leg swelling. Negative for palpitations.  Gastrointestinal:Negative for abdominal pain.  Musculoskeletal: Negative for joint swelling.  Skin: Negative for rash.  Neurological: Negative for numbness.  Psychiatric/Behavioral:Negative for dysphoric mood.     Occupation:  no h/o toxic exposure per her report, however she worked in a Survey[employer], quantity travels abroad TB exposure:denis Pets:none Originally from [place] BP 120/72  Pulse 50  Temp(Src) 97.9 F (36.6 C) (Oral)  Ht 5\' 3"  (1.6 m)  Wt 284 lb 3.2 oz (128.912 kg)  BMI 50.36 kg/m2  SpO2  96% General: Comfortable; Obese HEENT ; pupils round and reactive to light; mild nasal mucosa edema and erythema; retropharyngeal crowding with no erythema, edema or exudate;  no cervical LAD  Cardiovascular: s1s2, no murmurs. Regular rate and rhythm.  Respiratory: Decreased breath sounds bilaterally with scattered wheezing No increased work of breathing.  Abdomen: soft NT ND BS+.   Extremities: no pedal edema. Homans negative  Central nervous system: Alert and oriented No focal neurological deficits Musculoskeletal normal tone  Psychological normal mood    Current Outpatient Prescriptions  Medication Sig Dispense Refill  . albuterol (PROAIR HFA) 108 (90 BASE) MCG/ACT inhaler Inhale 2 puffs into the lungs every 4 (four) hours as needed for wheezing. Use only when you are wheezing  1 Inhaler  12  . amLODipine (NORVASC) 10 MG tablet Take 10 mg by mouth daily.      Marland Kitchen aspirin 81 MG tablet Take 1 tablet (81 mg total) by mouth daily.  30 tablet    . B-D INS SYRINGE 0.5CC/30GX1/2" 30G X 1/2" 0.5 ML MISC use as directed  100 each  PRN  . Biotin (BIOTIN 5000) 5 MG CAPS Take 1 capsule by mouth 2 (two) times daily.       Marland Kitchen BLACK CURRANT SEED OIL PO Take 1 capsule by mouth daily.      Marland Kitchen escitalopram (LEXAPRO) 20 MG tablet Take 20 mg by mouth daily.      Marland Kitchen esomeprazole (NEXIUM) 40 MG capsule Take 40 mg by mouth daily before breakfast.      . ferrous sulfate 325 (65 FE) MG tablet Take 325 mg by mouth 2 (two) times daily.      . fluticasone (FLOVENT HFA) 220 MCG/ACT inhaler Inhale 1 puff into the lungs 2 (two) times daily. Use even when feeling well      . furosemide (LASIX) 20 MG tablet Take 20 mg by mouth daily.      Marland Kitchen gabapentin (NEURONTIN) 600 MG tablet Take 600 mg by mouth 3 (three) times daily.      Marland Kitchen glucosamine-chondroitin 500-400 MG tablet Take 1 tablet by mouth 2 (two) times daily.      Marland Kitchen HYDROcodone-acetaminophen (NORCO/VICODIN) 5-325 MG per tablet Take 1 tablet by mouth every 12 (twelve)  hours as needed for pain.      Marland Kitchen insulin NPH (HUMULIN N,NOVOLIN N) 100 UNIT/ML injection Inject 30 Units into the skin daily.      . isosorbide mononitrate (IMDUR) 60 MG 24 hr tablet Take 60 mg by mouth daily.      Marland Kitchen levothyroxine (SYNTHROID, LEVOTHROID) 75 MCG tablet Take 50 mcg by mouth daily.       Marland Kitchen lisinopril (PRINIVIL,ZESTRIL) 5 MG tablet Take 5 mg by mouth daily.      . metoCLOPramide (REGLAN) 10 MG tablet Take 10 mg by mouth 4 (four) times  daily.      . metoprolol tartrate (LOPRESSOR) 12.5 mg TABS Take 0.5 tablets (12.5 mg total) by mouth 2 (two) times daily.  30 tablet  6  . mometasone (NASONEX) 50 MCG/ACT nasal spray Place 2 sprays into the nose daily.      . nitroGLYCERIN (NITROSTAT) 0.4 MG SL tablet Place 0.4 mg under the tongue every 5 (five) minutes as needed for chest pain.      Marland Kitchen OVER THE COUNTER MEDICATION Take 1 tablet by mouth 2 (two) times daily. Flexamin      . Polyethyl Glycol-Propyl Glycol (SYSTANE OP) Place 1 drop into both eyes 4 (four) times daily.      . prasugrel (EFFIENT) 10 MG TABS Take 1 tablet (10 mg total) by mouth daily.  30 tablet  0  . PRODIGY NO CODING BLOOD GLUC test strip TEST once daily  50 each  11  . traMADol (ULTRAM) 50 MG tablet Take 50 mg by mouth 3 (three) times daily.      . traZODone (DESYREL) 50 MG tablet Take 50 mg by mouth at bedtime.       No current facility-administered medications for this visit.   Facility-Administered Medications Ordered in Other Visits  Medication Dose Route Frequency Provider Last Rate Last Dose  . aspirin chewable tablet 81 mg  81 mg Oral Daily [name], MD        Labs and tests   CTA 2005 Clinical Data: Shortness of breath and chest pressure, getting increasingly worse in the last month. Hemoptysis.  CT ANGIOGRAPHY OF THE CHEST WITH INTRAVENOUS CONTRAST:  There is no evidence of pulmonary embolus. There is some mild dependent atelectasis in the posterior aspect of both lungs. The patient does have  bronchitic changes bilaterally, particularly in the left upper lobe in the perihilar region but there is no consolidative infiltrate. There are mild emphysematous changes throughout both upper lobes. There is mild mediastinal and bilateral hilar adenopathy which is nonspecific. Does the patient have a history of sarcoidosis? The largest node in the mediastinum is in the azygos region and measures 2.3 cm in size.  IMPRESSION:  No pulmonary embolus. Mediastinal adenopathy including azygos and subcarinal nodes which are enlarged. Bilateral mild hilar adenopathy. The appearance is nonspecific but could be due to sarcoidosis.  Incidental note is made of a 4.6 cm mass in the left lobe of the thyroid gland. The visualized portion of the right lobe is normal. Does the patient have a history of goiter? If not, further evaluation by thyroid ultrasound may be useful. The mass does extend substernally.  03/01/2005 thyroid nodule biopsy INTERPRETATION(S): FINE NEEDLE ASPIRATION (THIN PREP, SMEARS): CYTOLOGIC FINDINGS CONSISTENT WITH A HYPERPLASTIC NODULE, SEE COMMENT.  2Decho Diastolic dysf, EF [id number] no pulmonary  CTA chest 04/17/2012 IMPRESSION:  No definite evidence of pulmonary embolism though there are mild  limitations of exam secondary to slight respiratory motion and beam  hardening due to body habitus.  Minimal dependent atelectasis right lower lobe.  Question left thyroid mass 4.8 x 5.0 cm; dedicated thyroid  sonography recommended.   A/P    1. Dyspnea with exertion most likely related to asthma/emphysema, OHS/OSA, CAD and deconditioning.  I advised her to use Flovent 110 mcg twice daily and continue Albuterol as needed for dyspnea. She has the Flovent at home but she has not been using it regularly; I think taking it daily might improve her symptoms.  She enroledl in pulmonary rehabilitation but was not able to go  regularly due to frequent doctor's appointments.   CAD following with Dr. Nadyne Coombes  for CAD; she reports no chest pain today.   2. Asthmatic bronchitis Current bronchodilators: Flovent, Albuterol and Proair.   Spirometry 9/13 Pre bronchodilators FEV1 1.276L 73%  FVC 2.482 L 108%                             Post bronchodilators FEV1 1.325 76%  FVC 1.864 L 73%  Continue current therapy. She reports taking the rescue inhaler 3-4 times weekly, reports no nighttime awakenings, no recent steroid bursts or antibiotics, no recent admissions for asthma exacerbations. Asthma partially controlled; she would benefit from improved control; this might be achieved with daily use of Flovent.  I reiterated the importance of taking the steroid inhaler every day.    3. DLE She received a recent prednisone taper due to LE edema bilived to be secondary to DLE a couple of months prior. There is a risk of progression of DLE to SLE. CTA showed no evidence of ILD related to lupus.   4. Allergic rhinitis Continue Nasonex spray. The patient reports nasal bleed in the past from Flonase. Nasal congestion improved with Nasonex.   5. GERD currently on Nexium 40 mg daily. Reports no symptoms on medication.    6. Obesity, OHS and OSA  On oxygen at night; no need for oxygen with ambulation. Advised to loose weight. Counseling provided in clinic; would benefit from a nutrition consult.  OSA she uses 2L oxygen at night; has not been able to tolerate CPAP, BiPAP in the past.   7. General health up to date on  flu and pneumococcus vaccination.   8. Disposition RTC in 3 months.    9. Enlarged thyroid on CT chest; seems to be slightly enlarged compared to previous study. We will defer further investigation to Dr. Vista Lawman.    [name], MD [facility]

## 2012-10-03 ENCOUNTER — Encounter (HOSPITAL_COMMUNITY): Admission: RE | Admit: 2012-10-03 | Payer: Medicare Other | Source: Ambulatory Visit

## 2012-11-03 ENCOUNTER — Ambulatory Visit: Payer: Medicare Other | Admitting: Family Medicine

## 2012-11-06 ENCOUNTER — Other Ambulatory Visit: Payer: Self-pay | Admitting: Family Medicine

## 2012-11-14 ENCOUNTER — Other Ambulatory Visit: Payer: Self-pay | Admitting: Family Medicine

## 2012-12-02 ENCOUNTER — Telehealth: Payer: Self-pay | Admitting: Family Medicine

## 2012-12-02 NOTE — Telephone Encounter (Signed)
Spoke to pt nurse who states pt w/ 6lb wt gain over past 5 days or so and w/ LE edema.   Called pt feels well overall w/ some increase LE edema and minor SOB which is near baseline. Takes 20mg  lasix daily  Instructed pt to take 40mg  lasix now followed by 40mg  daily until symptoms improve and wt drops back to "dry" wt. Pt instructed to f/u in clinic next week.   Pt to go to ED if worsens.  Linna Darner, MD Family Medicine PGY-2 12/02/2012, 1:08 PM

## 2012-12-26 ENCOUNTER — Encounter: Payer: Self-pay | Admitting: Internal Medicine

## 2012-12-26 ENCOUNTER — Ambulatory Visit (INDEPENDENT_AMBULATORY_CARE_PROVIDER_SITE_OTHER): Payer: Medicare Other | Admitting: Internal Medicine

## 2012-12-26 VITALS — BP 140/80 | HR 58 | Temp 98.3°F | Ht 63.0 in | Wt 291.6 lb

## 2012-12-26 DIAGNOSIS — R0600 Dyspnea, unspecified: Secondary | ICD-10-CM

## 2012-12-26 DIAGNOSIS — R0609 Other forms of dyspnea: Secondary | ICD-10-CM

## 2012-12-26 NOTE — Assessment & Plan Note (Signed)
Agree real problem with shportness of breath is weight FOllow low glycemic diet sheet  - I am not responsible for side effects from this diet that you might have especially you taking insulin  - You can talk to your OSEI-BONSU,GEORGE, MD about how to adjust insulin for this diet  - The sheet is only a list of foods that are good for weight loss (extreme left lane)  - You are using it as a guide at your own risk Continue inhalers  #Followup  6 months or sooner if needed   > 50% of this > 25 min visit spent in face to face counseling (15 min visit converted to 25 min)

## 2012-12-26 NOTE — Progress Notes (Signed)
Subjective:    Patient ID: Brittany Roberts, female    DOB: May 31, 1941, 72 y.o.   MRN: [id number]  HPI  OV 09/27/12   HPI 72 yo with complex PMH significant for Obesity, deconditioning, diastolic dysfunction, Discoid Lupus erythematous and  Asthma presents to the clinic for dyspnea. We do have spirometry available on file from 03/28/12 FEV1 76%, FVC 81%, FEV1/FVC 71. She reports minimal cough , her main complaint is dyspnea at rest and with exertion, currently is not wearing oxygen at rest but does wear nocturnal oxygen for sleep apnea; she reports no recent episodes of asthma exacerbation and visits to the ED or hospitalizations.   Today she denies acute problems with fever or chills; increased dyspnea or cough, sick contacts,  nasal congestion, significantly improved with Nasonex, denies sinus pressure or GERD, though is taking PPIs routinely. Denies hemoptysis and paroxysmal nocturnal dyspnea.    At previous visit she endorsed chest pain, intermittent,  without associated wheezing, cough non pleuritic, at rest and with exertion. She had seen Dr. Nadyne Roberts and underwent catheterization with PTCI of a 90% coronary artery lesion.    has a past medical history of Depression; Lupus; Asthma; Diabetes mellitus; Hypertension; Hypothyroidism; Congestive heart failure, unspecified; Other and unspecified hyperlipidemia; Unspecified vitamin D deficiency; Renal failure, unspecified; and OSA (obstructive sleep apnea).   has past surgical history that includes Tubal ligation; Nasal sinus surgery; and Gallbladder surgery.  CT chest Oct 2013 IMPRESSION:  No definite evidence of pulmonary embolism though there are mild  limitations of exam secondary to slight respiratory motion and beam  hardening due to body habitus.  Minimal dependent atelectasis right lower lobe.  Question left thyroid mass 4.8 x 5.0 cm; dedicated thyroid  sonography recommended.  Original Report Authenticated By: Burnetta Sabin, M.D.    A/P     1. Dyspnea with exertion most likely related to asthma/emphysema, OHS/OSA, CAD and deconditioning.  I advised her to use Flovent 110 mcg twice daily and continue Albuterol as needed for dyspnea. She has the Flovent at home but she has not been using it regularly; I think taking it daily might improve her symptoms.  She enroledl in pulmonary rehabilitation but was not able to go regularly due to frequent doctor's appointments.   CAD following with Dr. Nadyne Roberts for CAD; she reports no chest pain today.   2. Asthmatic bronchitis Current bronchodilators: Flovent, Albuterol and Proair.   Spirometry 9/13 Pre bronchodilators FEV1 1.276L 73%  FVC 2.482 L 108%                             Post bronchodilators FEV1 1.325 76%  FVC 1.864 L 73%  Continue current therapy. She reports taking the rescue inhaler 3-4 times weekly, reports no nighttime awakenings, no recent steroid bursts or antibiotics, no recent admissions for asthma exacerbations. Asthma partially controlled; she would benefit from improved control; this might be achieved with daily use of Flovent.  I reiterated the importance of taking the steroid inhaler every day.    3. DLE She received a recent prednisone taper due to LE edema bilived to be secondary to DLE a couple of months prior. There is a risk of progression of DLE to SLE. CTA showed no evidence of ILD related to lupus.   4. Allergic rhinitis Continue Nasonex spray. The patient reports nasal bleed in the past from Flonase. Nasal congestion improved with Nasonex.   5. GERD currently on Nexium 40  mg daily. Reports no symptoms on medication.    6. Obesity, OHS and OSA  On oxygen at night; no need for oxygen with ambulation. Advised to loose Brittany. Counseling provided in clinic; would benefit from a nutrition consult.  OSA she uses 2L oxygen at night; has not been able to tolerate CPAP, BiPAP in the past.   7. General health up to date on  flu and pneumococcus vaccination.   8.  Disposition RTC in 3 months.    9. Enlarged thyroid on CT chest; seems to be slightly enlarged compared to previous study. We will defer further investigation to Dr. Vista Roberts.    Brittany Guernsey, MD [facility]      REC Continue current medications. Use Flovent daily.  Please initiate Brittany Roberts sinus rinses twice daily. Continue Nasonex.   We will schedule you to see Dr. Chase Roberts in 3-4 months for routine follow up.   In the interim please call with any questions or health changes.    OV 12/26/2012   FU dyspnea. Used t see Dr Brittany Roberts [place] States dyspnea is severe. Class 3. Exertional for 10 feet. Improved by rest. No asspicated chst pain, orthopnea, pnd, cough, chest pain. Stent to rCA in April 2014 did not help. BMI 50 and feels this is cause of dyspnea. Says wants to lose Brittany but PCP cannot help and she cannot afford Brittany Roberts or Brittany Roberts programs.  She wants my low glycemic diet sheet; I warned her that it might be unsafe in setting of her using insulin but she is adamant she take a look at it. She refuses to attend pulmonary rehab. She refuses to do CPST (cannot do it due to severe obesity). No ass edema, chest pain, orthopnea, no fver, no chills  Past, Family, Social reviewed: no change since last visit   Review of Systems  Constitutional: Negative for fever, chills, diaphoresis, activity change, appetite change, fatigue and unexpected Brittany change.  HENT: Negative for hearing loss, ear pain, nosebleeds, congestion, sore throat, facial swelling, rhinorrhea, sneezing, mouth sores, trouble swallowing, neck pain, neck stiffness, dental problem, voice change, postnasal drip, sinus pressure, tinnitus and ear discharge.   Eyes: Negative for photophobia, discharge, redness, itching and visual disturbance.  Respiratory: Positive for shortness of breath. Negative for apnea, cough, choking, chest tightness, wheezing and stridor.    Cardiovascular: Negative for chest pain, palpitations and leg swelling.  Gastrointestinal: Negative for nausea, vomiting, abdominal pain, constipation, blood in stool and abdominal distention.  Genitourinary: Negative for dysuria, urgency, frequency, hematuria, flank pain, decreased urine volume and difficulty urinating.  Musculoskeletal: Negative for myalgias, back pain, joint swelling, arthralgias and gait problem.  Skin: Negative for color change, pallor and rash.  Neurological: Negative for dizziness, tremors, seizures, syncope, speech difficulty, weakness, light-headedness, numbness and headaches.  Hematological: Negative for adenopathy. Does not bruise/bleed easily.  Psychiatric/Behavioral: Negative for confusion, sleep disturbance, dysphoric mood and agitation. The patient is not nervous/anxious.        Objective:   Physical Exam  Vitals reviewed. Constitutional: She is oriented to person, place, and time. She appears well-developed and well-nourished. No distress.  Body mass index is 51.67 kg/(m^2).   HENT:  Head: Normocephalic and atraumatic.  Right Ear: External ear normal.  Left Ear: External ear normal.  Mouth/Throat: Oropharynx is clear and moist. No oropharyngeal exudate.  Eyes: Conjunctivae and EOM are normal. Pupils are equal, round, and reactive to light. Right eye exhibits no discharge.  Left eye exhibits no discharge. No scleral icterus.  Neck: Normal range of motion. Neck supple. No JVD present. No tracheal deviation present. No thyromegaly present.  Cardiovascular: Normal rate, regular rhythm, normal heart sounds and intact distal pulses.  Exam reveals no gallop and no friction rub.   No murmur heard. Pulmonary/Chest: Effort normal and breath sounds normal. No respiratory distress. She has no wheezes. She has no rales. She exhibits no tenderness.  Abdominal: Soft. Bowel sounds are normal. She exhibits no distension and no mass. There is no tenderness. There is no  rebound and no guarding.  Musculoskeletal: Normal range of motion. She exhibits no edema and no tenderness.  Walker at her side  Lymphadenopathy:    She has no cervical adenopathy.  Neurological: She is alert and oriented to person, place, and time. She has normal reflexes. No cranial nerve deficit. She exhibits normal muscle tone. Coordination normal.  Skin: Skin is warm and dry. No rash noted. She is not diaphoretic. No erythema. No pallor.  Discoid lupus  Psychiatric: She has a normal mood and affect. Her behavior is normal. Judgment and thought content normal.          Assessment & Plan:

## 2012-12-26 NOTE — Patient Instructions (Addendum)
Agree real problem with shportness of breath is weight FOllow low glycemic diet sheet  - I am not responsible for side effects from this diet that you might have especially you taking insulin  - You can talk to your OSEI-BONSU,GEORGE, MD about how to adjust insulin for this diet  - The sheet is only a list of foods that are good for weight loss (extreme left lane)  - You are using it as a guide at your own risk Continue inhalers  #Followup  6 months or sooner if needed

## 2012-12-26 NOTE — Progress Notes (Signed)
Subjective:    Patient ID: Brittany Roberts, female    DOB: Feb 11, 1941, 72 y.o.   MRN: [id number]  HPI 72 yo woman presents to clinic as a new consult requested by Dr. Vista Lawman for asthma management.     HPI 72 yo with complex PMH significant for Obesity, deconditioning, diastolic dysfunction, Discoid Lupus erythematous and  Asthma presents to the clinic for dyspnea. We do have spirometry available on file from 03/28/12 FEV1 76%, FVC 81%, FEV1/FVC 71. She reports minimal cough , her main complaint is dyspnea at rest and with exertion, currently is not wearing oxygen at rest but does wear nocturnal oxygen for sleep apnea; she reports no recent episodes of asthma exacerbation and visits to the ED or hospitalizations.   Today she denies acute problems with fever or chills; increased dyspnea or cough, sick contacts,  nasal congestion, significantly improved with Nasonex, denies sinus pressure or GERD, though is taking PPIs routinely. Denies hemoptysis and paroxysmal nocturnal dyspnea.    At previous visit she endorsed chest pain, intermittent,  without associated wheezing, cough non pleuritic, at rest and with exertion. She had seen Dr. Nadyne Coombes and underwent catheterization with PTCI of a 90% coronary artery lesion.    has a past medical history of Depression; Lupus; Asthma; Diabetes mellitus; Hypertension; Hypothyroidism; Congestive heart failure, unspecified; Other and unspecified hyperlipidemia; Unspecified vitamin D deficiency; Renal failure, unspecified; and OSA (obstructive sleep apnea).   has past surgical history that includes Tubal ligation; Nasal sinus surgery; and Gallbladder surgery.   CTA 2005 Clinical Data: Shortness of breath and chest pressure, getting increasingly worse in the last month. Hemoptysis.  CT ANGIOGRAPHY OF THE CHEST WITH INTRAVENOUS CONTRAST:  There is no evidence of pulmonary embolus. There is some mild dependent atelectasis in the posterior aspect of both lungs. The  patient does have bronchitic changes bilaterally, particularly in the left upper lobe in the perihilar region but there is no consolidative infiltrate. There are mild emphysematous changes throughout both upper lobes. There is mild mediastinal and bilateral hilar adenopathy which is nonspecific. Does the patient have a history of sarcoidosis? The largest node in the mediastinum is in the azygos region and measures 2.3 cm in size.  IMPRESSION:  No pulmonary embolus. Mediastinal adenopathy including azygos and subcarinal nodes which are enlarged. Bilateral mild hilar adenopathy. The appearance is nonspecific but could be due to sarcoidosis.  Incidental note is made of a 4.6 cm mass in the left lobe of the thyroid gland. The visualized portion of the right lobe is normal. Does the patient have a history of goiter? If not, further evaluation by thyroid ultrasound may be useful. The mass does extend substernally.  03/01/2005 thyroid nodule biopsy INTERPRETATION(S): FINE NEEDLE ASPIRATION (THIN PREP, SMEARS): CYTOLOGIC FINDINGS CONSISTENT WITH A HYPERPLASTIC NODULE, SEE COMMENT.  2Decho Diastolic dysf, EF [id number] no pulmonary  CTA chest 04/17/2012 IMPRESSION:  No definite evidence of pulmonary embolism though there are mild  limitations of exam secondary to slight respiratory motion and beam  hardening due to body habitus.  Minimal dependent atelectasis right lower lobe.  Question left thyroid mass 4.8 x 5.0 cm; dedicated thyroid  sonography recommended.        Review of Systems     Objective:   Physical Exam   General: Comfortable; Obese HEENT ; pupils round and reactive to light; mild nasal mucosa edema and erythema; retropharyngeal crowding with no erythema, edema or exudate;  no cervical LAD  Cardiovascular: s1s2, no murmurs. Regular rate and rhythm.  Respiratory: Decreased breath sounds bilaterally with scattered wheezing No increased work of breathing.  Abdomen: soft NT ND BS+.    Extremities: no pedal edema. Homans negative  Central nervous system: Alert and oriented No focal neurological deficits Musculoskeletal normal tone  Psychological normal mood  Labs and tests            Assessment & Plan:

## 2013-01-11 ENCOUNTER — Other Ambulatory Visit: Payer: Self-pay

## 2013-02-16 ENCOUNTER — Ambulatory Visit (INDEPENDENT_AMBULATORY_CARE_PROVIDER_SITE_OTHER): Payer: Medicare Other | Admitting: Family Medicine

## 2013-02-16 ENCOUNTER — Encounter: Payer: Self-pay | Admitting: Family Medicine

## 2013-02-16 VITALS — BP 148/58 | HR 64 | Temp 98.0°F | Ht 63.0 in | Wt 282.0 lb

## 2013-02-16 DIAGNOSIS — I10 Essential (primary) hypertension: Secondary | ICD-10-CM

## 2013-02-16 DIAGNOSIS — M2669 Other specified disorders of temporomandibular joint: Secondary | ICD-10-CM

## 2013-02-16 DIAGNOSIS — E1149 Type 2 diabetes mellitus with other diabetic neurological complication: Secondary | ICD-10-CM

## 2013-02-16 DIAGNOSIS — M26629 Arthralgia of temporomandibular joint, unspecified side: Secondary | ICD-10-CM | POA: Insufficient documentation

## 2013-02-16 DIAGNOSIS — M1990 Unspecified osteoarthritis, unspecified site: Secondary | ICD-10-CM

## 2013-02-16 LAB — CBC
HCT: 36.5 % (ref 36.0–46.0)
Hemoglobin: 12.2 g/dL (ref 12.0–15.0)
MCH: 24.5 pg — ABNORMAL LOW (ref 26.0–34.0)
MCHC: 33.4 g/dL (ref 30.0–36.0)
MCV: 73.4 fL — ABNORMAL LOW (ref 78.0–100.0)
Platelets: 242 10*3/uL (ref 150–400)
RBC: 4.97 MIL/uL (ref 3.87–5.11)
RDW: 15.3 % (ref 11.5–15.5)
WBC: 9.3 10*3/uL (ref 4.0–10.5)

## 2013-02-16 LAB — LDL CHOLESTEROL, DIRECT: Direct LDL: 59 mg/dL

## 2013-02-16 LAB — POCT GLYCOSYLATED HEMOGLOBIN (HGB A1C): Hemoglobin A1C: 6.1

## 2013-02-16 MED ORDER — TRAMADOL HCL 50 MG PO TABS: 50.0000 mg | ORAL_TABLET | Freq: Three times a day (TID) | ORAL | Status: DC

## 2013-02-16 NOTE — Assessment & Plan Note (Signed)
Good control

## 2013-02-16 NOTE — Assessment & Plan Note (Signed)
Cause of ear pain.  Rx with tramadol.

## 2013-02-16 NOTE — Patient Instructions (Addendum)
Thank you for bringing in your medicines. Your diabetes is under good control. Take the tramadol for your arthritis and ear pain.  The prescription is refillable Good job on the weight loss. I want to see you in 3 months. I will call with the blood work results.

## 2013-02-16 NOTE — Progress Notes (Signed)
  Subjective:    Patient ID: Brittany Roberts, female    DOB: 23-Nov-1940, 72 y.o.   MRN: [id number]  HPI No visit to me in over a year.  Has been seen by cards with a stent in 2/14.  Also seen by pulm C/O left ear pain - actually shooting pain on side of face C/O sore on buttocks.  States now improving. DM good A1C on meds.  Not on ACE. Has seen eye doc but can'r remember when Not on ACE, apparently was on lisinopril but stopped by Cards, uncertain why Given co morbid problems, not a candidate for colonoscopy    Review of Systems     Objective:   Physical Exam  TMs normal Rt TMJ tenderness and clunking Lungs clear Cardiac RRR without m or g Abd benign Buttocks, no abscess or redness.  Scar at area of pain.  Herpes vrs healed abscess?       Assessment & Plan:

## 2013-02-17 LAB — COMPLETE METABOLIC PANEL WITH GFR
ALT: 15 U/L (ref 0–35)
AST: 24 U/L (ref 0–37)
Albumin: 3.8 g/dL (ref 3.5–5.2)
Alkaline Phosphatase: 125 U/L — ABNORMAL HIGH (ref 39–117)
BUN: 20 mg/dL (ref 6–23)
CO2: 24 mEq/L (ref 19–32)
Calcium: 9.5 mg/dL (ref 8.4–10.5)
Chloride: 102 mEq/L (ref 96–112)
Creat: 1.5 mg/dL — ABNORMAL HIGH (ref 0.50–1.10)
GFR, Est African American: 40 mL/min — ABNORMAL LOW
GFR, Est Non African American: 34 mL/min — ABNORMAL LOW
Glucose, Bld: 159 mg/dL — ABNORMAL HIGH (ref 70–99)
Potassium: 4.6 mEq/L (ref 3.5–5.3)
Sodium: 138 mEq/L (ref 135–145)
Total Bilirubin: 0.6 mg/dL (ref 0.3–1.2)
Total Protein: 8.5 g/dL — ABNORMAL HIGH (ref 6.0–8.3)

## 2013-02-19 ENCOUNTER — Encounter: Payer: Self-pay | Admitting: Family Medicine

## 2013-03-04 ENCOUNTER — Other Ambulatory Visit: Payer: Self-pay | Admitting: Family Medicine

## 2013-04-01 ENCOUNTER — Other Ambulatory Visit: Payer: Self-pay | Admitting: Family Medicine

## 2013-04-09 ENCOUNTER — Telehealth: Payer: Self-pay | Admitting: Family Medicine

## 2013-04-09 NOTE — Telephone Encounter (Signed)
Patient states she feels she has a sinus inf and nothing is helping. Would like to speak to MD/ nurse. Please call.

## 2013-04-09 NOTE — Telephone Encounter (Signed)
Instructed patient to schedule an appointment to be seen,she'll be calling back to schedule unable to come in today will try to get appt for tomorrow. Brittany Roberts, [name]

## 2013-04-10 ENCOUNTER — Encounter: Payer: Self-pay | Admitting: Family Medicine

## 2013-04-10 ENCOUNTER — Ambulatory Visit (INDEPENDENT_AMBULATORY_CARE_PROVIDER_SITE_OTHER): Payer: Medicare Other | Admitting: Family Medicine

## 2013-04-10 VITALS — BP 137/70 | HR 72 | Temp 98.2°F | Ht 63.0 in | Wt 286.0 lb

## 2013-04-10 DIAGNOSIS — M25512 Pain in left shoulder: Secondary | ICD-10-CM

## 2013-04-10 DIAGNOSIS — S43499A Other sprain of unspecified shoulder joint, initial encounter: Secondary | ICD-10-CM

## 2013-04-10 DIAGNOSIS — S46212A Strain of muscle, fascia and tendon of other parts of biceps, left arm, initial encounter: Secondary | ICD-10-CM | POA: Insufficient documentation

## 2013-04-10 DIAGNOSIS — M25519 Pain in unspecified shoulder: Secondary | ICD-10-CM

## 2013-04-10 MED ORDER — HYDROCODONE-ACETAMINOPHEN 5-325 MG PO TABS: 1.0000 | ORAL_TABLET | Freq: Four times a day (QID) | ORAL | Status: DC | PRN

## 2013-04-10 NOTE — Assessment & Plan Note (Signed)
Assessment: history physical exam consistent with rupture of long head of biceps tendon proximally Plan: no intervention needed at this time, patient will continue to allow swelling and bruising to decrease and increase range of motion as tolerated

## 2013-04-10 NOTE — Patient Instructions (Signed)
Brittany Roberts,   It was nice to see you today. You have two problems going on today.   1. Left Shoulder Pain - This is caused by a rupture of the biceps tendon. This should start to improve. You can take pain medication as needed.   2. Cough - You have a viral infection. It should get better on it's own. Please return soon if you develop difficulty breathing breathing or fever.   Please follow up with Dr. [name] in 2 weeks about your shoulder.   Sincerely,   Dr. Maricela Bo

## 2013-04-10 NOTE — Progress Notes (Signed)
  Subjective:    Patient ID: Brittany Roberts, female    DOB: [birth year], 72 y.o.   MRN: [id number]  HPI  72 year old F who presents for evaluation of nasal drainage and cough and left upper extremity/shoulder pain.   Cough: This has been present for several days. It is unchanged. She has tried medications including Tussionex, which has not helped. It is not associated with shortness of breath, fever, chills, nausea, vomiting or diarrhea. She does not have any sick contacts.    Left upper extremity pain: patient had her last 4 days she's had pain in her left shoulder left upper shotty associated with marked bruising swelling and a "knot" around her left biceps. She does not remember a specific injury that occurred or any heavy lifting that she has done recently. She denies any falls. She denies yourself injections in that area. She is taking Prasugrel.     Past Medical History  Diagnosis Date  . Depression   . Lupus   . Asthma   . Diabetes mellitus   . Hypertension   . Hypothyroidism   . Congestive heart failure, unspecified   . Other and unspecified hyperlipidemia   . Unspecified vitamin D deficiency   . Renal failure, unspecified   . OSA (obstructive sleep apnea)      Review of Systems  Constitutional: Negative for fever, chills and fatigue.  HENT: Positive for congestion, dental problem and voice change. Negative for hearing loss, ear pain, facial swelling, trouble swallowing and postnasal drip.   Eyes: Negative for discharge.  Respiratory: Negative for chest tightness and shortness of breath.   Cardiovascular: Negative for chest pain.  Gastrointestinal: Negative for nausea, vomiting, abdominal pain and diarrhea.  Musculoskeletal: Positive for joint swelling.       Left shoulder swelling  Neurological: Positive for light-headedness. Negative for dizziness.  Hematological: Bruises/bleeds easily.       Bruising of left upper extremity       Objective:   Physical Exam    Constitutional: She is oriented to person, place, and time.  Morbidly obese, chronically ill-appearing  HENT:  Head: Normocephalic.  Right Ear: External ear normal.  Left Ear: External ear normal.  Mouth/Throat: Oropharynx is clear and moist.  edentulous  Eyes: Conjunctivae are normal. Pupils are equal, round, and reactive to light. Right eye exhibits no discharge. Left eye exhibits no discharge.  Neck: Normal range of motion. Neck supple.  Pulmonary/Chest: No stridor.  Musculoskeletal:       Left shoulder: She exhibits decreased range of motion, tenderness, swelling and pain.  Deformity of left biceps area which was slightly difficult procedure given the woman's include obesity obesity; positive speeds test and positive Yurgenson's   Lymphadenopathy:    She has no cervical adenopathy.  Neurological: She is alert and oriented to person, place, and time.  tardive dyskinesia  Skin: Bruising, ecchymosis and rash noted.     Hypopigmented malar rash on face; market bruising on left upper extremity proximal and distal to the elbow primarily on the ulnar side   BP 137/70  Pulse 72  Temp(Src) 98.2 F (36.8 C) (Oral)  Ht 5\' 3"  (1.6 m)  Wt 286 lb (129.729 kg)  BMI 50.68 kg/m2  SpO2 98%       Assessment & Plan:  Cough: likely secondary to viral URI, no evidence of bacterial infection, pneumonia or respiratory difficulty therefore patient will use expectant management

## 2013-04-25 ENCOUNTER — Encounter: Payer: Self-pay | Admitting: Family Medicine

## 2013-04-25 ENCOUNTER — Ambulatory Visit (INDEPENDENT_AMBULATORY_CARE_PROVIDER_SITE_OTHER): Payer: Medicare Other | Admitting: Family Medicine

## 2013-04-25 VITALS — BP 174/65 | HR 68 | Temp 97.4°F | Ht 63.0 in | Wt 291.0 lb

## 2013-04-25 DIAGNOSIS — I10 Essential (primary) hypertension: Secondary | ICD-10-CM

## 2013-04-25 DIAGNOSIS — IMO0001 Reserved for inherently not codable concepts without codable children: Secondary | ICD-10-CM

## 2013-04-25 DIAGNOSIS — Z23 Encounter for immunization: Secondary | ICD-10-CM

## 2013-04-25 DIAGNOSIS — M791 Myalgia, unspecified site: Secondary | ICD-10-CM | POA: Insufficient documentation

## 2013-04-25 LAB — CBC
HCT: 34.4 % — ABNORMAL LOW (ref 36.0–46.0)
Hemoglobin: 11.5 g/dL — ABNORMAL LOW (ref 12.0–15.0)
MCH: 24.8 pg — ABNORMAL LOW (ref 26.0–34.0)
MCHC: 33.4 g/dL (ref 30.0–36.0)
MCV: 74.1 fL — ABNORMAL LOW (ref 78.0–100.0)
Platelets: 237 10*3/uL (ref 150–400)
RBC: 4.64 MIL/uL (ref 3.87–5.11)
RDW: 15.9 % — ABNORMAL HIGH (ref 11.5–15.5)
WBC: 10.1 10*3/uL (ref 4.0–10.5)

## 2013-04-25 LAB — BASIC METABOLIC PANEL
BUN: 22 mg/dL (ref 6–23)
CO2: 33 mEq/L — ABNORMAL HIGH (ref 19–32)
Calcium: 9.1 mg/dL (ref 8.4–10.5)
Chloride: 104 mEq/L (ref 96–112)
Creat: 1.46 mg/dL — ABNORMAL HIGH (ref 0.50–1.10)
Glucose, Bld: 116 mg/dL — ABNORMAL HIGH (ref 70–99)
Potassium: 4.9 mEq/L (ref 3.5–5.3)
Sodium: 140 mEq/L (ref 135–145)

## 2013-04-25 LAB — CK: Total CK: 81 U/L (ref 7–177)

## 2013-04-25 LAB — POCT SEDIMENTATION RATE: POCT SED RATE: 64 mm/hr — AB (ref 0–22)

## 2013-04-25 NOTE — Patient Instructions (Signed)
Dear Ms. Danzey,   Thank you for coming to clinic today. Please read below regarding the issues that we discussed.   1. Muscle Pain - I am going to do some blood tests to look for muscle inflammation. I will let you know the results.   2. Blood Pressure - Please start back taking the aldactone.   Please follow up in clinic in 4 weeks. Please call earlier if you have any questions or concerns.   Sincerely,   Dr. Maricela Bo

## 2013-04-25 NOTE — Assessment & Plan Note (Signed)
Normally well controlled but patient has not been taking aldactone so she will restart and follow up

## 2013-04-25 NOTE — Progress Notes (Signed)
  Subjective:    Patient ID: Brittany Roberts, female    DOB: August 11, 1940, 72 y.o.   MRN: [id number]  HPI  Cough: presented on 04/10/13 with persistent cough dianosed with viral URI, patient notes that cough has resolved, no nasal congestion or shortness of breath   Hypertension:    Office BP: BP Readings from Last 3 Encounters:  04/25/13 174/65  04/10/13 137/70  02/16/13 148/58    Prescribed meds: amlodipine, imdur, aldactone, lasix  Hypertension ROS:  Taking medications as prescribed:No - has been out of aldactone, will restart tonight Chest pain: No Shortness of breath: No Swelling of extremities: No TIA symptoms: No Exercise: no, patient incredible obese and elderly, not able to ambulate without assistance  Low Salt Diet: no  MSK Pain:  Present in thighs, buttocks and lower back for several months, no history of trauma and has not been worked up by other physicians according to the patient, no swollen joints, fevers, or swelling; mild bruises that she notes on her right and left upper extremity due to rupture of biceps tendon, pain persistent throughout the day, exacerbated by palpation, nothing tried for relief   Review of Systems See HPI    Objective:   Physical Exam BP 174/65  Pulse 68  Temp(Src) 97.4 F (36.3 C) (Oral)  Ht 5\' 3"  (1.6 m)  Wt 291 lb (131.997 kg)  BMI 51.56 kg/m2  SpO2 92%  Gen: elderly, morbidly obese, pleasant, non distressed CV: distant heart sounds Pulm: CTA-B, normal WOB MSK: generalize tenderness of all major muscle groups of LE bilaterally, no bruising or deformity, no joint swelling; no edema of ankles or feet       Assessment & Plan:  Viral URI with Cough - Resolved

## 2013-04-25 NOTE — Assessment & Plan Note (Addendum)
Assessment: myalgias of thighs and pelvic girdle with upper arm pain, which may be confounded by left biceps tendon rupture; ddx includes statin myalgias, PMR, hypothyroidism, or lupus related Plan: check ESR, CRP, CMP and BMET

## 2013-04-27 ENCOUNTER — Telehealth: Payer: Self-pay | Admitting: Family Medicine

## 2013-04-27 DIAGNOSIS — M353 Polymyalgia rheumatica: Secondary | ICD-10-CM

## 2013-04-27 MED ORDER — PREDNISONE (PAK) 5 MG PO TABS: 15.0000 mg | ORAL_TABLET | Freq: Every day | ORAL | Status: DC

## 2013-04-27 NOTE — Telephone Encounter (Signed)
Given patient's complaints of proximal muscle weakness and soreness and elevated ESR (64) and normal CK, I have diagnosed her with polymyalgia rheumatica. Therefore, I will use steroid 15 mg daily x 7 days. Then the patient will follow up. If she has not response to steroids, then I will likely encourage her to quit taking Lipitor for a few weeks. Patient acknowledged understanding.

## 2013-05-23 ENCOUNTER — Ambulatory Visit: Payer: Medicare Other | Admitting: Family Medicine

## 2013-05-30 ENCOUNTER — Encounter: Payer: Self-pay | Admitting: Family Medicine

## 2013-05-30 ENCOUNTER — Ambulatory Visit (INDEPENDENT_AMBULATORY_CARE_PROVIDER_SITE_OTHER): Payer: Medicare Other | Admitting: Family Medicine

## 2013-05-30 VITALS — BP 126/87 | HR 59 | Temp 98.0°F | Ht 63.0 in | Wt 290.0 lb

## 2013-05-30 DIAGNOSIS — F411 Generalized anxiety disorder: Secondary | ICD-10-CM

## 2013-05-30 DIAGNOSIS — I10 Essential (primary) hypertension: Secondary | ICD-10-CM

## 2013-05-30 DIAGNOSIS — D649 Anemia, unspecified: Secondary | ICD-10-CM

## 2013-05-30 DIAGNOSIS — E1149 Type 2 diabetes mellitus with other diabetic neurological complication: Secondary | ICD-10-CM

## 2013-05-30 DIAGNOSIS — M353 Polymyalgia rheumatica: Secondary | ICD-10-CM | POA: Insufficient documentation

## 2013-05-30 LAB — POCT GLYCOSYLATED HEMOGLOBIN (HGB A1C): Hemoglobin A1C: 6.7

## 2013-05-30 MED ORDER — FUROSEMIDE 40 MG PO TABS: 40.0000 mg | ORAL_TABLET | Freq: Every day | ORAL | Status: DC

## 2013-05-30 MED ORDER — ESCITALOPRAM OXALATE 20 MG PO TABS: 20.0000 mg | ORAL_TABLET | Freq: Every day | ORAL | Status: DC

## 2013-05-30 NOTE — Progress Notes (Signed)
Subjective:    Patient ID: Brittany Roberts, female    DOB: 1941/06/23, 72 y.o.   MRN: [id number]  HPI  74 year od F with HTN, DM type 2, COPD and discoid lupus who presents for follow up of HTN.   Hypertension  Office BP: BP Readings from Last 3 Encounters:  05/30/13 126/87  04/25/13 174/65  04/10/13 137/70    Prescribed meds: amlodipine, imdur, aldactone, lasix   Hypertension ROS:  Taking medications as prescribed: NO, at previous visit patient had been out of aldactone and now off of Lasix Chest pain: No Shortness of breath: Yes Swelling of extremities: No TIA symptoms: No Regular Exercise: no Low Na+ Diet: no  "Spells" - pt concerned that her blood sugar is getting to low b/c she had a "spell" on Monday during which she is was warm, sweaty, shaking and tremulousness, but check sugar was 141; duration of incident was less than 1 hour; she tried to eat apple sauce and yogurt and turned on air conditioning to get cool and it eventually passed  - Current insulin dose 30 units NPH once daily - Patient also concerned that is could be from being off Lexapro for 1 month   Current Outpatient Prescriptions on File Prior to Visit  Medication Sig Dispense Refill  . albuterol (PROAIR HFA) 108 (90 BASE) MCG/ACT inhaler Inhale 2 puffs into the lungs every 4 (four) hours as needed for wheezing. Use only when you are wheezing  1 Inhaler  12  . amLODipine (NORVASC) 10 MG tablet Take 10 mg by mouth daily.      Marland Kitchen aspirin 81 MG tablet Take 1 tablet (81 mg total) by mouth daily.  30 tablet    . atorvastatin (LIPITOR) 40 MG tablet Take 40 mg by mouth daily.       . B-D INS SYRINGE 0.5CC/30GX1/2" 30G X 1/2" 0.5 ML MISC use as directed  100 each  PRN  . escitalopram (LEXAPRO) 20 MG tablet Take 20 mg by mouth daily.      . ferrous sulfate 325 (65 FE) MG tablet Take 650 mg by mouth daily with breakfast.       . fluticasone (FLOVENT HFA) 220 MCG/ACT inhaler Inhale 1 puff into the lungs 2 (two) times  daily. Use even when feeling well      . furosemide (LASIX) 40 MG tablet Take 40 mg by mouth daily.       Marland Kitchen gabapentin (NEURONTIN) 600 MG tablet Take 600 mg by mouth 3 (three) times daily.      Marland Kitchen glucosamine-chondroitin 500-400 MG tablet Take 1 tablet by mouth 2 (two) times daily.      Marland Kitchen HYDROcodone-acetaminophen (NORCO) 5-325 MG per tablet Take 1 tablet by mouth every 6 (six) hours as needed for pain.  30 tablet  0  . insulin NPH (HUMULIN N,NOVOLIN N) 100 UNIT/ML injection Inject 30 Units into the skin daily.      . isosorbide mononitrate (IMDUR) 60 MG 24 hr tablet TAKE 1 TABLET BY MOUTH ONCE DAILY FOR HEART  30 tablet  11  . levothyroxine (SYNTHROID, LEVOTHROID) 75 MCG tablet Take 50 mcg by mouth daily.       . mometasone (NASONEX) 50 MCG/ACT nasal spray Place 2 sprays into the nose daily.      Marland Kitchen NEXIUM 40 MG capsule take 1 capsule by mouth once daily  30 capsule  11  . nitroGLYCERIN (NITROSTAT) 0.4 MG SL tablet Place 0.4 mg under the tongue every 5 (  five) minutes as needed for chest pain.      Marland Kitchen OVER THE COUNTER MEDICATION Take 1 tablet by mouth 2 (two) times daily. Flexamin      . Polyethyl Glycol-Propyl Glycol (SYSTANE OP) Place 1 drop into both eyes 4 (four) times daily.      . prasugrel (EFFIENT) 10 MG TABS Take 1 tablet (10 mg total) by mouth daily.  30 tablet  0  . predniSONE (STERAPRED UNI-PAK) 5 MG TABS tablet Take 3 tablets (15 mg total) by mouth daily.  21 tablet  0  . PRODIGY NO CODING BLOOD GLUC test strip TEST once daily  50 each  11  . spironolactone (ALDACTONE) 25 MG tablet Take 25 mg by mouth 2 (two) times daily.       . traMADol (ULTRAM) 50 MG tablet Take 1 tablet (50 mg total) by mouth 3 (three) times daily.  90 tablet  5  . traZODone (DESYREL) 50 MG tablet take 1 tablet by mouth at bedtime if needed for sleep  30 tablet  6   Current Facility-Administered Medications on File Prior to Visit  Medication Dose Route Frequency Provider Last Rate Last Dose  . aspirin chewable  tablet 81 mg  81 mg Oral Daily [name], MD         Review of Systems See HPI     Objective:   Physical Exam BP 126/87  Pulse 59  Temp(Src) 98 F (36.7 C) (Oral)  Ht 5\' 3"  (1.6 m)  Wt 290 lb (131.543 kg)  BMI 51.38 kg/m2  Gen: elderly, morbidly obese, pleasant, non distressed  CV: distant heart sounds  Pulm: CTA-B, normal WOB Extremities: no edema, tenderness to right anterior ankle which pt notes is chronic      Assessment & Plan:

## 2013-05-30 NOTE — Assessment & Plan Note (Signed)
A: improvement in pain and weakness after course of steroids P: no further eval needed at this time

## 2013-05-30 NOTE — Assessment & Plan Note (Signed)
A: pt with mildly low hgb of 11.5 and MCV of 74 on last CBC but asymptomatic P: no tx at this time given lack of clinical benefit from hgb of 11.5 to 12.0 and also due to constipation from Fe; cont to trend; make sure colon cancer screening up to date

## 2013-05-30 NOTE — Assessment & Plan Note (Signed)
A: patient's spells not like related to low blood sugar, but would rather err on side of cautions since A1C in good control P: decrease insulin NPH to 25 units from 30 units for next month

## 2013-05-30 NOTE — Assessment & Plan Note (Signed)
A: improved control on aldactone P: refill lasix and cont current regimen

## 2013-05-30 NOTE — Patient Instructions (Addendum)
Dear Brittany Roberts,   I refilled your medications of Lexapro and Lasix. Please start those today. I am glad that your blood pressure and muscle soreness are getting better.   Please follow up with Dr. [name] in 1 month.   Sincerely,   Dr. Maricela Bo

## 2013-05-31 ENCOUNTER — Other Ambulatory Visit: Payer: Self-pay | Admitting: Family Medicine

## 2013-06-26 ENCOUNTER — Ambulatory Visit: Payer: Medicare Other | Admitting: Internal Medicine

## 2013-07-03 ENCOUNTER — Ambulatory Visit: Payer: Medicare Other | Admitting: Family Medicine

## 2013-07-10 ENCOUNTER — Ambulatory Visit: Payer: Medicare Other | Admitting: Internal Medicine

## 2013-07-11 ENCOUNTER — Encounter: Payer: Self-pay | Admitting: Family Medicine

## 2013-07-11 ENCOUNTER — Ambulatory Visit (INDEPENDENT_AMBULATORY_CARE_PROVIDER_SITE_OTHER): Payer: Medicare Other | Admitting: Family Medicine

## 2013-07-11 VITALS — BP 137/62 | HR 72 | Temp 98.6°F | Ht 63.0 in | Wt 283.3 lb

## 2013-07-11 DIAGNOSIS — E039 Hypothyroidism, unspecified: Secondary | ICD-10-CM

## 2013-07-11 DIAGNOSIS — J45909 Unspecified asthma, uncomplicated: Secondary | ICD-10-CM

## 2013-07-11 DIAGNOSIS — M353 Polymyalgia rheumatica: Secondary | ICD-10-CM

## 2013-07-11 DIAGNOSIS — I10 Essential (primary) hypertension: Secondary | ICD-10-CM

## 2013-07-11 DIAGNOSIS — I2510 Atherosclerotic heart disease of native coronary artery without angina pectoris: Secondary | ICD-10-CM

## 2013-07-11 DIAGNOSIS — D649 Anemia, unspecified: Secondary | ICD-10-CM

## 2013-07-11 DIAGNOSIS — G2401 Drug induced subacute dyskinesia: Secondary | ICD-10-CM | POA: Insufficient documentation

## 2013-07-11 LAB — POCT SEDIMENTATION RATE: POCT SED RATE: 63 mm/hr — AB (ref 0–22)

## 2013-07-11 MED ORDER — GABAPENTIN 600 MG PO TABS: 600.0000 mg | ORAL_TABLET | Freq: Three times a day (TID) | ORAL | Status: DC

## 2013-07-11 MED ORDER — MOMETASONE FUROATE 50 MCG/ACT NA SUSP: 2.0000 | Freq: Every day | NASAL | Status: DC

## 2013-07-11 MED ORDER — ATORVASTATIN CALCIUM 40 MG PO TABS: 40.0000 mg | ORAL_TABLET | Freq: Every day | ORAL | Status: DC

## 2013-07-11 MED ORDER — AMLODIPINE BESYLATE 10 MG PO TABS: 10.0000 mg | ORAL_TABLET | Freq: Every day | ORAL | Status: DC

## 2013-07-11 MED ORDER — FLUTICASONE PROPIONATE HFA 220 MCG/ACT IN AERO: 1.0000 | INHALATION_SPRAY | Freq: Two times a day (BID) | RESPIRATORY_TRACT | Status: DC

## 2013-07-11 MED ORDER — ALBUTEROL SULFATE HFA 108 (90 BASE) MCG/ACT IN AERS: 2.0000 | INHALATION_SPRAY | RESPIRATORY_TRACT | Status: DC | PRN

## 2013-07-11 NOTE — Assessment & Plan Note (Addendum)
Refill gabapentin  Check sed rate, I am reluctant to start prednisone because it would worsen DM, HBP and obesity.

## 2013-07-11 NOTE — Patient Instructions (Signed)
I am sorry you are feeling so bad. I refilled many of your medicines. I will call with your blood test results. See me in 3-4 weeks and we will start making changes slowly.

## 2013-07-12 ENCOUNTER — Encounter: Payer: Self-pay | Admitting: Family Medicine

## 2013-07-12 LAB — CBC
HCT: 36.2 % (ref 36.0–46.0)
Hemoglobin: 12.1 g/dL (ref 12.0–15.0)
MCH: 25 pg — ABNORMAL LOW (ref 26.0–34.0)
MCHC: 33.4 g/dL (ref 30.0–36.0)
MCV: 74.8 fL — ABNORMAL LOW (ref 78.0–100.0)
Platelets: 248 10*3/uL (ref 150–400)
RBC: 4.84 MIL/uL (ref 3.87–5.11)
RDW: 15.8 % — ABNORMAL HIGH (ref 11.5–15.5)
WBC: 12.6 10*3/uL — ABNORMAL HIGH (ref 4.0–10.5)

## 2013-07-12 LAB — TSH: TSH: 0.619 u[IU]/mL (ref 0.350–4.500)

## 2013-07-12 NOTE — Assessment & Plan Note (Signed)
It is likely that the combination of effient and aspirin is causing her bruising.  Needs dual antiplatelet therapy through mid Feb.  Will check CBC to make sure no anemia or thrombocytopenia.

## 2013-07-12 NOTE — Progress Notes (Signed)
   Subjective:    Patient ID: Brittany Roberts, female    DOB: 1940-10-19, 73 y.o.   MRN: [id number]  HPI  Patient feels not well.  C/O easy bruising and is tired all the time.  She would like a medication to stop her from moving her jaw.  Nicely, she brought in all her meds for me to review, which allowed me to do a thorough med review.  She has a history of PMR which could explain vague symptoms of weakness.  She is on dual antiplatelet therapy x 1 year from a cath 08/2012 (Will be a year in ~6 weeks.)  On the good side, her weight has slowly been coming down.  She remains active and functional.      Review of Systems     Objective:   Physical Exam Lungs clear Cardiac RRR without m or g abd benign Ext trace bilateral edema.        Assessment & Plan:

## 2013-07-12 NOTE — Assessment & Plan Note (Signed)
Good control

## 2013-07-12 NOTE — Assessment & Plan Note (Signed)
With history of hypothyroid and now easy fatigue, will check TSH.

## 2013-07-12 NOTE — Assessment & Plan Note (Signed)
Check CBC 

## 2013-08-06 ENCOUNTER — Other Ambulatory Visit: Payer: Self-pay | Admitting: Family Medicine

## 2013-08-06 DIAGNOSIS — M1990 Unspecified osteoarthritis, unspecified site: Secondary | ICD-10-CM

## 2013-08-06 NOTE — Assessment & Plan Note (Signed)
Refilled tramadol per e request

## 2013-08-10 ENCOUNTER — Encounter: Payer: Self-pay | Admitting: Family Medicine

## 2013-08-10 ENCOUNTER — Ambulatory Visit (INDEPENDENT_AMBULATORY_CARE_PROVIDER_SITE_OTHER): Payer: Medicare Other | Admitting: Family Medicine

## 2013-08-10 VITALS — BP 138/68 | HR 89 | Temp 98.7°F | Ht 63.0 in | Wt 285.5 lb

## 2013-08-10 DIAGNOSIS — M1990 Unspecified osteoarthritis, unspecified site: Secondary | ICD-10-CM

## 2013-08-10 DIAGNOSIS — I10 Essential (primary) hypertension: Secondary | ICD-10-CM

## 2013-08-10 DIAGNOSIS — Z23 Encounter for immunization: Secondary | ICD-10-CM

## 2013-08-10 MED ORDER — TRAMADOL HCL 50 MG PO TABS: 50.0000 mg | ORAL_TABLET | Freq: Four times a day (QID) | ORAL | Status: DC

## 2013-08-10 MED ORDER — SPIRONOLACTONE 25 MG PO TABS: 25.0000 mg | ORAL_TABLET | Freq: Two times a day (BID) | ORAL | Status: DC

## 2013-08-10 MED ORDER — HYDROCOD POLST-CHLORPHEN POLST 10-8 MG/5ML PO LQCR: 5.0000 mL | Freq: Two times a day (BID) | ORAL | Status: DC | PRN

## 2013-08-10 NOTE — Patient Instructions (Signed)
I am glad things are going well. You can take four tramadol for the pain. I will see you in three months. Go to the eye doctor and have them send me a copy of the report. Get a mammogram - you are due.

## 2013-08-10 NOTE — Assessment & Plan Note (Signed)
Increase tramadol to four per day, which would max out this med.

## 2013-08-10 NOTE — Progress Notes (Signed)
   Subjective:    Patient ID: Brittany Roberts, female    DOB: [birth year], 73 y.o.   MRN: [id number]  HPI  Patient is feeling generally OK.  Biggest complaint is that she wants more pain meds.  My conceptualization of her pain is that it is primarily due to osteoarthritis.  No chest pain.  No SOB.    Behind on Mammogram and eye visit.  Not yet due for DM recheck.  Review of Systems     Objective:   Physical Exam   Low back diffuse tenderness.          Assessment & Plan:

## 2013-08-13 ENCOUNTER — Telehealth: Payer: Self-pay | Admitting: *Deleted

## 2013-08-13 NOTE — Telephone Encounter (Signed)
Rx request from [facility] for gabapentin 600 mg 1 tab PO 3 times daily.  The 600 mg tab is on backorder, would you like to change to 300 mg #180 2 tab PO TID?  Rx request in PCP box for review. Derl Barrow, RN

## 2013-08-13 NOTE — Telephone Encounter (Signed)
Called and authorized the change.

## 2013-08-27 ENCOUNTER — Telehealth: Payer: Self-pay | Admitting: Family Medicine

## 2013-08-27 DIAGNOSIS — M353 Polymyalgia rheumatica: Secondary | ICD-10-CM

## 2013-08-27 MED ORDER — PREDNISONE 10 MG PO TABS: ORAL_TABLET | ORAL | Status: DC

## 2013-08-27 NOTE — Assessment & Plan Note (Signed)
Will do short burst of pred

## 2013-08-27 NOTE — Telephone Encounter (Signed)
Called patient,she's requesting some prednisone she doesn't need Tramadol refill.She complains of  joint pain and states prednisone is the only medication that helps.please advise.Jazell Rosenau, [name]

## 2013-08-27 NOTE — Telephone Encounter (Signed)
Pt called and would like Dr. [name] to call her and also give her refill on her Tramadol. jw

## 2013-08-27 NOTE — Telephone Encounter (Signed)
Called and discussed.  Was on steroids for "20 years" when first diagnosed with lupus.  Seen in Nov. By Dr. Maricela Bo who gave her 2 week course of pred based on symptoms and sed rate =63.  This "knocked the symptoms right out." Will give another short course.  May need long term (6 month) of low dose (pred 10 mg daily) if really is PMR.

## 2013-09-07 ENCOUNTER — Telehealth: Payer: Self-pay | Admitting: Family Medicine

## 2013-09-07 ENCOUNTER — Other Ambulatory Visit: Payer: Self-pay | Admitting: Family Medicine

## 2013-09-07 MED ORDER — OMEPRAZOLE 40 MG PO CPDR: 40.0000 mg | DELAYED_RELEASE_CAPSULE | Freq: Every day | ORAL | Status: DC

## 2013-09-07 NOTE — Telephone Encounter (Signed)
Wants to talk to dr hensel about her blood sugar. In the morning the reading is in the 200's During the day, it stays in the that range. Please advise

## 2013-09-07 NOTE — Telephone Encounter (Signed)
Medicaid requires prior authorization for Nexium.  I will switch to omeprazole.

## 2013-09-11 MED ORDER — INSULIN NPH (HUMAN) (ISOPHANE) 100 UNIT/ML ~~LOC~~ SUSP: 20.0000 [IU] | Freq: Two times a day (BID) | SUBCUTANEOUS | Status: DC

## 2013-09-11 NOTE — Telephone Encounter (Signed)
Dr [name] spoke with patient.Brittany Roberts, Brittany Roberts

## 2013-09-11 NOTE — Telephone Encounter (Signed)
Pt called again and would like to speak to Dr. [name] about her medication. She said that she is out and that the pharmacy is faxing [place]. She only wants to speak to Dr. [name]. jw

## 2013-09-11 NOTE — Telephone Encounter (Signed)
Pt called again. She still wants to talk to dr [name]

## 2013-09-11 NOTE — Telephone Encounter (Signed)
Called patient refused to talk to me.Only wants to talk to Dr Sheliah Mends.  please advise.Ruddy Swire, [name]

## 2013-09-26 ENCOUNTER — Telehealth: Payer: Self-pay | Admitting: Family Medicine

## 2013-09-26 DIAGNOSIS — R109 Unspecified abdominal pain: Secondary | ICD-10-CM

## 2013-09-26 DIAGNOSIS — R499 Unspecified voice and resonance disorder: Secondary | ICD-10-CM

## 2013-09-26 NOTE — Telephone Encounter (Signed)
Pt wants dr [name] to call

## 2013-09-27 ENCOUNTER — Other Ambulatory Visit: Payer: Self-pay

## 2013-09-27 DIAGNOSIS — R499 Unspecified voice and resonance disorder: Secondary | ICD-10-CM | POA: Insufficient documentation

## 2013-09-27 DIAGNOSIS — R109 Unspecified abdominal pain: Secondary | ICD-10-CM | POA: Insufficient documentation

## 2013-09-27 DIAGNOSIS — Z1231 Encounter for screening mammogram for malignant neoplasm of breast: Secondary | ICD-10-CM

## 2013-09-27 NOTE — Telephone Encounter (Signed)
Discussed with Ms Meng who actually requests two referrals.   C/O sever, Rt sided abd pain.  Has seen Deatra Ina for GI in the past.  Wants to return to him Also want ENT referral for "I'm losing my voice"   Adamant that she needs to see both.  Offered to see her first and she declined.   Will do referrals.

## 2013-09-27 NOTE — Telephone Encounter (Signed)
[facility] and Throat Pt needs a referral to ENT. That is what she wanted to talk to dr [name] about "this is very important"

## 2013-09-28 NOTE — Telephone Encounter (Signed)
Referral processed [facility] will contact pt. I was calling pt for ENT appt and unable to communicate with pt, phone is unable to lvm.  Pt has an appt sch please see referral.   [place]

## 2013-10-01 ENCOUNTER — Telehealth: Payer: Self-pay | Admitting: Family Medicine

## 2013-10-01 DIAGNOSIS — J45909 Unspecified asthma, uncomplicated: Secondary | ICD-10-CM

## 2013-10-01 DIAGNOSIS — F411 Generalized anxiety disorder: Secondary | ICD-10-CM

## 2013-10-01 DIAGNOSIS — I2510 Atherosclerotic heart disease of native coronary artery without angina pectoris: Secondary | ICD-10-CM

## 2013-10-01 DIAGNOSIS — M353 Polymyalgia rheumatica: Secondary | ICD-10-CM

## 2013-10-01 DIAGNOSIS — I10 Essential (primary) hypertension: Secondary | ICD-10-CM

## 2013-10-01 NOTE — Telephone Encounter (Signed)
Please advise. Brittany Roberts  

## 2013-10-01 NOTE — Telephone Encounter (Signed)
Got a form from the sickle cell office about being a new pt. She doesn't understand. Has not heard about her referrals from GI and ENT Please advise

## 2013-10-01 NOTE — Telephone Encounter (Signed)
Pt called and would like [place] to fax a copy of her latest lab results and a med list to her insurance company attention [place] [phone]. She would also like 90 day supply on all medications the are available since it will so much cheaper for her. jw

## 2013-10-01 NOTE — Telephone Encounter (Signed)
Pt returned call

## 2013-10-02 ENCOUNTER — Telehealth: Payer: Self-pay | Admitting: Gastroenterology

## 2013-10-02 MED ORDER — ALBUTEROL SULFATE HFA 108 (90 BASE) MCG/ACT IN AERS: 2.0000 | INHALATION_SPRAY | RESPIRATORY_TRACT | Status: DC | PRN

## 2013-10-02 MED ORDER — FUROSEMIDE 40 MG PO TABS: 40.0000 mg | ORAL_TABLET | Freq: Every day | ORAL | Status: DC

## 2013-10-02 MED ORDER — GABAPENTIN 600 MG PO TABS: 600.0000 mg | ORAL_TABLET | Freq: Three times a day (TID) | ORAL | Status: DC

## 2013-10-02 MED ORDER — INSULIN NPH (HUMAN) (ISOPHANE) 100 UNIT/ML ~~LOC~~ SUSP: 20.0000 [IU] | Freq: Two times a day (BID) | SUBCUTANEOUS | Status: DC

## 2013-10-02 MED ORDER — TRAZODONE HCL 50 MG PO TABS: 50.0000 mg | ORAL_TABLET | Freq: Every evening | ORAL | Status: DC | PRN

## 2013-10-02 MED ORDER — ESCITALOPRAM OXALATE 20 MG PO TABS: 20.0000 mg | ORAL_TABLET | Freq: Every day | ORAL | Status: DC

## 2013-10-02 MED ORDER — FLUTICASONE PROPIONATE HFA 220 MCG/ACT IN AERO
1.0000 | INHALATION_SPRAY | Freq: Two times a day (BID) | RESPIRATORY_TRACT | Status: DC
Start: 2013-10-02 — End: 2013-11-08

## 2013-10-02 MED ORDER — METOPROLOL TARTRATE 25 MG PO TABS: 12.5000 mg | ORAL_TABLET | Freq: Two times a day (BID) | ORAL | Status: DC

## 2013-10-02 MED ORDER — MOMETASONE FUROATE 50 MCG/ACT NA SUSP: 2.0000 | Freq: Every day | NASAL | Status: DC

## 2013-10-02 MED ORDER — AMLODIPINE BESYLATE 10 MG PO TABS: 10.0000 mg | ORAL_TABLET | Freq: Every day | ORAL | Status: DC

## 2013-10-02 MED ORDER — LEVOTHYROXINE SODIUM 75 MCG PO TABS: 75.0000 ug | ORAL_TABLET | Freq: Every day | ORAL | Status: DC

## 2013-10-02 MED ORDER — OMEPRAZOLE 40 MG PO CPDR: 40.0000 mg | DELAYED_RELEASE_CAPSULE | Freq: Every day | ORAL | Status: DC

## 2013-10-02 MED ORDER — ISOSORBIDE MONONITRATE ER 60 MG PO TB24: 60.0000 mg | ORAL_TABLET | Freq: Every day | ORAL | Status: DC

## 2013-10-02 MED ORDER — NITROGLYCERIN 0.4 MG SL SUBL: 0.4000 mg | SUBLINGUAL_TABLET | SUBLINGUAL | Status: DC | PRN

## 2013-10-02 MED ORDER — PREDNISONE 10 MG PO TABS: 10.0000 mg | ORAL_TABLET | Freq: Every day | ORAL | Status: DC

## 2013-10-02 MED ORDER — ATORVASTATIN CALCIUM 40 MG PO TABS: 40.0000 mg | ORAL_TABLET | Freq: Every day | ORAL | Status: DC

## 2013-10-02 MED ORDER — SPIRONOLACTONE 25 MG PO TABS: 25.0000 mg | ORAL_TABLET | Freq: Two times a day (BID) | ORAL | Status: DC

## 2013-10-02 NOTE — Telephone Encounter (Signed)
Pt states she has been having pain on the right side of her abdomen for several weeks. Requests to be seen. Offered pt an appt for this Thursday but she states her week is full and needs something next week. Pt scheduled to see Amy Esterwood 10/09/13@1 :30pm. Pt aware.

## 2013-10-02 NOTE — Telephone Encounter (Signed)
Called and spoke to patient.  Told to ignore the [place] letter - it was probably a mistake.    She wants 90 day refills.  Spoke to pharmacist who suggested best plan was to send new 90 day scripts for all her meds and that she would reconcile with existing prescriptions.    In reviewing cards notes, wanted on effient for 1 year, which is now expired.  Will switch back to single antiplatelet therapy with aspirin alone.

## 2013-10-02 NOTE — Telephone Encounter (Signed)
Patient states she never asked for labs or blood work to be faxed.Please advise.Elius Etheredge, [name]

## 2013-10-04 ENCOUNTER — Ambulatory Visit (INDEPENDENT_AMBULATORY_CARE_PROVIDER_SITE_OTHER): Payer: Medicare Other | Admitting: Internal Medicine

## 2013-10-04 ENCOUNTER — Encounter: Payer: Self-pay | Admitting: Internal Medicine

## 2013-10-04 VITALS — BP 132/82 | HR 65 | Ht 63.0 in | Wt 291.0 lb

## 2013-10-04 DIAGNOSIS — J45909 Unspecified asthma, uncomplicated: Secondary | ICD-10-CM

## 2013-10-04 NOTE — Progress Notes (Signed)
Subjective:    Patient ID: Brittany Roberts, female    DOB: 06/01/1941, 73 y.o.   MRN: [id number]  HPI   OV 09/27/12   HPI 73 yo with complex PMH significant for Obesity, deconditioning, diastolic dysfunction, Discoid Lupus erythematous and  Asthma presents to the clinic for dyspnea. We do have spirometry available on file from 03/28/12 FEV1 76%, FVC 81%, FEV1/FVC 71. She reports minimal cough , her main complaint is dyspnea at rest and with exertion, currently is not wearing oxygen at rest but does wear nocturnal oxygen for sleep apnea; she reports no recent episodes of asthma exacerbation and visits to the ED or hospitalizations.   Today she denies acute problems with fever or chills; increased dyspnea or cough, sick contacts,  nasal congestion, significantly improved with Nasonex, denies sinus pressure or GERD, though is taking PPIs routinely. Denies hemoptysis and paroxysmal nocturnal dyspnea.    At previous visit she endorsed chest pain, intermittent,  without associated wheezing, cough non pleuritic, at rest and with exertion. She had seen Dr. Nadyne Coombes and underwent catheterization with PTCI of a 90% coronary artery lesion.    has a past medical history of Depression; Lupus; Asthma; Diabetes mellitus; Hypertension; Hypothyroidism; Congestive heart failure, unspecified; Other and unspecified hyperlipidemia; Unspecified vitamin D deficiency; Renal failure, unspecified; and OSA (obstructive sleep apnea).   has past surgical history that includes Tubal ligation; Nasal sinus surgery; and Gallbladder surgery.  CT chest Oct 2013 IMPRESSION:  No definite evidence of pulmonary embolism though there are mild  limitations of exam secondary to slight respiratory motion and beam  hardening due to body habitus.  Minimal dependent atelectasis right lower lobe.  Question left thyroid mass 4.8 x 5.0 cm; dedicated thyroid  sonography recommended.  Original Report Authenticated By: Burnetta Sabin, M.D.     A/P    1. Dyspnea with exertion most likely related to asthma/emphysema, OHS/OSA, CAD and deconditioning.  I advised her to use Flovent 110 mcg twice daily and continue Albuterol as needed for dyspnea. She has the Flovent at home but she has not been using it regularly; I think taking it daily might improve her symptoms.  She enroledl in pulmonary rehabilitation but was not able to go regularly due to frequent doctor's appointments.   CAD following with Dr. Nadyne Coombes for CAD; she reports no chest pain today.   2. Asthmatic bronchitis Current bronchodilators: Flovent, Albuterol and Proair.   Spirometry 9/13 Pre bronchodilators FEV1 1.276L 73%  FVC 2.482 L 108%                             Post bronchodilators FEV1 1.325 76%  FVC 1.864 L 73%  Continue current therapy. She reports taking the rescue inhaler 3-4 times weekly, reports no nighttime awakenings, no recent steroid bursts or antibiotics, no recent admissions for asthma exacerbations. Asthma partially controlled; she would benefit from improved control; this might be achieved with daily use of Flovent.  I reiterated the importance of taking the steroid inhaler every day.    3. DLE She received a recent prednisone taper due to LE edema bilived to be secondary to DLE a couple of months prior. There is a risk of progression of DLE to SLE. CTA showed no evidence of ILD related to lupus.   4. Allergic rhinitis Continue Nasonex spray. The patient reports nasal bleed in the past from Flonase. Nasal congestion improved with Nasonex.   5. GERD currently on  Nexium 40 mg daily. Reports no symptoms on medication.    6. Obesity, OHS and OSA  On oxygen at night; no need for oxygen with ambulation. Advised to loose weight. Counseling provided in clinic; would benefit from a nutrition consult.  OSA she uses 2L oxygen at night; has not been able to tolerate CPAP, BiPAP in the past.   7. General health up to date on  flu and pneumococcus vaccination.    8. Disposition RTC in 3 months.    9. Enlarged thyroid on CT chest; seems to be slightly enlarged compared to previous study. We will defer further investigation to Dr. Vista Lawman.    [name], MD [facility]      REC Continue current medications. Use Flovent daily.  Please initiate [place] sinus rinses twice daily. Continue Nasonex.   We will schedule you to see Dr. Chase Caller in 3-4 months for routine follow up.   In the interim please call with any questions or health changes.    OV 12/26/2012   FU dyspnea. Used t see Dr Conception Chancy who [place] now [place]other [place]. States dyspnea is severe. Class 3. Exertional for 10 feet. Improved by rest. No asspicated chst pain, orthopnea, pnd, cough, chest pain. Stent to rCA in April 2014 did not help. BMI 50 and feels this is cause of dyspnea. Says wants to lose weight but PCP cannot help and she cannot afford ATkins or wEight watcher programs.  She wants my low glycemic diet sheet; I warned her that it might be unsafe in setting of her using insulin but she is adamant she take a look at it. She refuses to attend pulmonary rehab. She refuses to do CPST (cannot do it due to severe obesity). No ass edema, chest pain, orthopnea, no fver, no chills  Past, Family, Social reviewed: no change since last visit   Agree real problem with shportness of breath is weight FOllow low glycemic diet sheet  - I am not responsible for side effects from this diet that you might have especially you taking insulin  - You can talk to your OSEI-BONSU,GEORGE, MD about how to adjust insulin for this diet  - The sheet is only a list of foods that are good for weight loss (extreme left lane)  - You are using it as a guide at your own risk Continue inhalers  #Followup  6 months or sooner if needed  OV 10/04/2013   Chief Complaint  Patient presents with  . Follow-up    for SOB. Pt states she still has SOB but this is not any worse.     Follow up dyspnea the setting of morbid obesity with a BMI of 50, coronary artery disease stent to the right coronary artery April 2014 and supposed asthma and in the setting of discoid lupus erythematosus  Dyspnea is the same. She has not lost any weight yet she tried the low glycemic diet for a while but gave up. BMI continues to be above 50. She is compliant with her medications and Flovent which are not helping. She again does not want to do pulmonary stress test. She is looking for more inhalers. She has tried for an evaluation with [facility] [place] [facility] for bariatric surgery but apparently was turned down @ telephone triage system  Review of Systems  Constitutional: Negative for fever and unexpected weight change.  HENT: Negative for congestion, dental problem, ear pain, nosebleeds, postnasal drip, rhinorrhea, sinus pressure, sneezing, sore throat  and trouble swallowing.   Eyes: Negative for redness and itching.  Respiratory: Positive for shortness of breath. Negative for cough, chest tightness and wheezing.   Cardiovascular: Negative for palpitations and leg swelling.  Gastrointestinal: Negative for nausea and vomiting.  Genitourinary: Negative for dysuria.  Musculoskeletal: Negative for joint swelling.  Skin: Negative for rash.  Neurological: Negative for headaches.  Hematological: Does not bruise/bleed easily.  Psychiatric/Behavioral: Negative for dysphoric mood. The patient is not nervous/anxious.        Objective:   Physical Exam  Filed Vitals:   10/04/13 1050  BP: 132/82  Pulse: 65  Height: 5\' 3"  (1.6 m)  Weight: 291 lb (131.997 kg)  SpO2: 96%    Body mass index is 51.56 kg/(m^2).  itals reviewed. Constitutional: She is oriented to person, place, and time. She appears well-developed and well-nourished. No distress.  Body mass index is 51.56 kg/(m^2).  HENT:  Head: Normocephalic and atraumatic.  Right Ear: External ear normal.  Left Ear: External ear normal.   Mouth/Throat: Oropharynx is clear and moist. No oropharyngeal exudate.  Eyes: Conjunctivae and EOM are normal. Pupils are equal, round, and reactive to light. Right eye exhibits no discharge. Left eye exhibits no discharge. No scleral icterus.  Neck: Normal range of motion. Neck supple. No JVD present. No tracheal deviation present. No thyromegaly present.  Cardiovascular: Normal rate, regular rhythm, normal heart sounds and intact distal pulses.  Exam reveals no gallop and no friction rub.   No murmur heard. Pulmonary/Chest: Effort normal and breath sounds normal. No respiratory distress. She has no wheezes. She has no rales. She exhibits no tenderness.  Abdominal: Soft. Bowel sounds are normal. She exhibits no distension and no mass. There is no tenderness. There is no rebound and no guarding.  Musculoskeletal: Normal range of motion. She exhibits no edema and no tenderness.  Walker at her side  Lymphadenopathy:    She has no cervical adenopathy.  Neurological: She is alert and oriented to person, place, and time. She has normal reflexes. No cranial nerve deficit. She exhibits normal muscle tone. Coordination normal.  Skin: Skin is warm and dry. No rash noted. She is not diaphoretic. No erythema. No pallor.  Discoid lupus  Psychiatric: She has a normal mood and affect. Her behavior is normal. Judgment and thought content normal.        Assessment & Plan:

## 2013-10-04 NOTE — Patient Instructions (Signed)
Try advair 100/50, 1 puff twice daily  Stop flovent REturn to see me in 6 months

## 2013-10-09 ENCOUNTER — Encounter: Payer: Self-pay | Admitting: Physician Assistant

## 2013-10-09 ENCOUNTER — Other Ambulatory Visit (INDEPENDENT_AMBULATORY_CARE_PROVIDER_SITE_OTHER): Payer: Medicare Other

## 2013-10-09 ENCOUNTER — Ambulatory Visit (INDEPENDENT_AMBULATORY_CARE_PROVIDER_SITE_OTHER): Payer: Medicare Other | Admitting: Physician Assistant

## 2013-10-09 VITALS — BP 126/88 | HR 72 | Ht 63.0 in | Wt 288.0 lb

## 2013-10-09 DIAGNOSIS — K5900 Constipation, unspecified: Secondary | ICD-10-CM

## 2013-10-09 DIAGNOSIS — R1031 Right lower quadrant pain: Secondary | ICD-10-CM

## 2013-10-09 LAB — BASIC METABOLIC PANEL
BUN: 18 mg/dL (ref 6–23)
CO2: 25 mEq/L (ref 19–32)
Calcium: 9.4 mg/dL (ref 8.4–10.5)
Chloride: 98 mEq/L (ref 96–112)
Creatinine, Ser: 1.6 mg/dL — ABNORMAL HIGH (ref 0.4–1.2)
GFR: 42.04 mL/min — ABNORMAL LOW (ref >60.00)
Glucose, Bld: 133 mg/dL — ABNORMAL HIGH (ref 70–99)
Potassium: 4.3 mEq/L (ref 3.5–5.1)
Sodium: 134 mEq/L — ABNORMAL LOW (ref 135–145)

## 2013-10-09 MED ORDER — POLYETHYLENE GLYCOL 3350 17 GM/SCOOP PO POWD: ORAL | Status: DC

## 2013-10-09 NOTE — Progress Notes (Signed)
Subjective:    Patient ID: Brittany Roberts, female    DOB: 1940-09-14, 73 y.o.   MRN: [id number]  HPI Brittany Roberts is a 73 year old Afro-American female known remotely to Dr. Deatra Ina. Old records show that she did have a colonoscopy in 2004 with removal of a 2 mm cecal polyp this was hot biopsied and we did not have any past she also had EGD in 2003 which was negative.  Patient has history of hypertension, depression, osteoarthritis, adult onset diabetes mellitus, coronary artery disease, restless leg syndrome, discoid lupus, GERD, COPD, hypothyroidism, and has a tardive dyskinesia apparently from previous Reglan use.  She comes in today with complaints of right lower abdominal pain. She is a fair historian but says that she has been having discomfort in her right side for the past couple of months. She describes it as a burning and bloated sensation. She is also concerned about some did not see can feel in her abdominal wall. [place] her pain is worse postprandially and better if she doesn't eat. Her appetite has been fair her weight has been stable though she says she cannot taste well. She has chronic problems with constipation and says she usually goes 2-3 days between bowel movements. She has not been using any regular laxative. She denies any radiation of pain into her back and has not had any known injury.    Review of Systems  Constitutional: Negative.   HENT: Negative.   Eyes: Negative.   Respiratory: Negative.   Cardiovascular: Negative.   Gastrointestinal: Positive for abdominal pain and constipation.  Endocrine: Negative.   Genitourinary: Negative.   Musculoskeletal: Positive for arthralgias, gait problem and joint swelling.  Allergic/Immunologic: Negative.   Neurological: Negative.   Hematological: Negative.   Psychiatric/Behavioral: Negative.    Outpatient Prescriptions Prior to Visit  Medication Sig Dispense Refill  . amLODipine (NORVASC) 10 MG tablet Take 1 tablet (10 mg total) by  mouth daily.  90 tablet  3  . aspirin 81 MG tablet Take 1 tablet (81 mg total) by mouth daily.  30 tablet    . atorvastatin (LIPITOR) 40 MG tablet Take 1 tablet (40 mg total) by mouth daily.  90 tablet  3  . B-D INS SYRINGE 0.5CC/30GX1/2" 30G X 1/2" 0.5 ML MISC use as directed  100 each  PRN  . escitalopram (LEXAPRO) 20 MG tablet Take 1 tablet (20 mg total) by mouth daily.  90 tablet  3  . fluticasone (FLOVENT HFA) 220 MCG/ACT inhaler Inhale 1 puff into the lungs 2 (two) times daily. Use even when feeling well  3 Inhaler  3  . furosemide (LASIX) 40 MG tablet Take 1 tablet (40 mg total) by mouth daily.  90 tablet  3  . gabapentin (NEURONTIN) 600 MG tablet Take 1 tablet (600 mg total) by mouth 3 (three) times daily.  270 tablet  3  . insulin NPH Human (HUMULIN N,NOVOLIN N) 100 UNIT/ML injection Inject 0.2 mLs (20 Units total) into the skin 2 (two) times daily before a meal.  50 mL  3  . isosorbide mononitrate (IMDUR) 60 MG 24 hr tablet Take 1 tablet (60 mg total) by mouth daily.  90 tablet  3  . levothyroxine (SYNTHROID, LEVOTHROID) 75 MCG tablet Take 1 tablet (75 mcg total) by mouth daily.  90 tablet  3  . metoprolol tartrate (LOPRESSOR) 25 MG tablet Take 0.5 tablets (12.5 mg total) by mouth 2 (two) times daily.  90 tablet  3  . mometasone (NASONEX) 50  MCG/ACT nasal spray Place 2 sprays into the nose daily.  51 g  3  . nitroGLYCERIN (NITROSTAT) 0.4 MG SL tablet Place 1 tablet (0.4 mg total) under the tongue every 5 (five) minutes as needed for chest pain.  20 tablet  12  . Polyethyl Glycol-Propyl Glycol (SYSTANE OP) Place 1 drop into both eyes 4 (four) times daily.      Marland Kitchen spironolactone (ALDACTONE) 25 MG tablet Take 1 tablet (25 mg total) by mouth 2 (two) times daily.  180 tablet  3  . traMADol (ULTRAM) 50 MG tablet Take 1 tablet (50 mg total) by mouth 4 (four) times daily.  120 tablet  5  . traZODone (DESYREL) 50 MG tablet Take 1 tablet (50 mg total) by mouth at bedtime as needed for sleep.  90  tablet  3  . albuterol (PROAIR HFA) 108 (90 BASE) MCG/ACT inhaler Inhale 2 puffs into the lungs every 4 (four) hours as needed for wheezing. Use only when you are wheezing  3 Inhaler  3  . glucosamine-chondroitin 500-400 MG tablet Take 1 tablet by mouth 2 (two) times daily.      Marland Kitchen OVER THE COUNTER MEDICATION Take 1 tablet by mouth 2 (two) times daily. Flexamin      . PRODIGY NO CODING BLOOD GLUC test strip TEST once daily  50 each  11   No facility-administered medications prior to visit.   Allergies  Allergen Reactions  . Pregabalin     REACTION: swelling  . Ropinirole Hydrochloride     REACTION: swelling  . Penicillins Swelling and Rash   Patient Active Problem List   Diagnosis Date Noted  . Abdominal pain, unspecified site 09/27/2013  . Hoarseness or changing voice 09/27/2013  . Dyskinesia, tardive 07/11/2013  . Polymyalgia rheumatica 05/30/2013  . TMJ syndrome 02/16/2013  . Angina pectoris associated with type 2 diabetes mellitus 08/08/2012  . CAD in native artery 08/08/2012  . Asthma 05/30/2012  . Insomnia 09/22/2011  . Mobility poor 02/02/2011  . CHRONIC KIDNEY DISEASE STAGE II (MILD) 05/13/2010  . CHRONIC DIASTOLIC HEART FAILURE [id number]  . COPD (chronic obstructive pulmonary disease) 03/26/2010  . OBESITY 02/27/2010  . DIABETIC NEUROPATHY, TYPE 2 04/25/2009  . OSTEOARTHRITIS 04/25/2009  . ANEMIA NOS 03/11/2007  . COGNITIVE IMPAIRMENT, MILD, SO STATED 03/11/2007  . RESTLESS LEG SYNDROME 03/11/2007  . LUPUS ERYTHEMATOSUS, DISCOID 03/11/2007  . HYPOTHYROIDISM 03/07/2007  . DYSLIPIDEMIA 03/07/2007  . DEPRESSION 03/07/2007  . HYPERTENSION 03/07/2007  . CORONARY ARTERY DISEASE 03/07/2007  . GERD 03/07/2007  . SLEEP APNEA 03/07/2007   History  Substance Use Topics  . Smoking status: Former Smoker -- 3.00 packs/day for 20 years    Types: Cigarettes    Quit date: 07/06/1999  . Smokeless tobacco: Never Used  . Alcohol Use: No   family history includes Asthma in  her brother; Clotting disorder in her mother; Diabetes in her mother, sister, and sister; Heart disease in her mother; Pneumonia in her father; Rheum arthritis in her father.     Objective:   Physical Exam  well-developed female in no acute distress, she has a tardive dyskinesia with continuous mouth movements blood pressure 126/88 pulse 72 height 5 foot 3 weight 288, BMI 51. HEENT; nontraumatic normocephalic EOMI PERRLA sclera anicteric continuous facial and mouth movements, Supple; no JVD, Cardiovascular ;regular rate and rhythm with S1-S2 no murmur or gallop, Pulmonary ;clear bilaterally, Abdomen; obese soft she is tender in the right mid quadrant right lower quadrant, she has a cholecystectomy  scar and a low midline incisional scar no palpable mass or hepatosplenomegaly no guarding or rebound, she has some small subcutaneous lesions which are likely scarring from previous insulin injections, Rectal; not done, Extremities; decreased mobility no clubbing cyanosis or edema, Psych; mood appropriate, affect odd.        Assessment & Plan:  #54  73 year old female status post cholecystectomy with complaint of 2 month history of right lower quadrant abdominal pain in the setting of chronic constipation. Etiology is not clear rule out secondary to obstipation rule out occult lesion #2 status post hysterectomy #3 adult-onset diabetes mellitus with diabetic neuropathy  #4 hypertension #5 coronary artery disease #6 COPD #7 chronic GERD #8 discoid lupus #9 osteoarthritis #10 tardive dyskinesia #11 remote history of colon polyps last colonoscopy 2004  Plan; start MiraLax 17 g in 8 ounces of water every day Schedule for CT scan of the abdomen and pelvis with contrast If CT is unrevealing she will need colonoscopy-. Her BMI is greater than 50 -so this will need to be scheduled at the hospital with Dr. Deatra Ina

## 2013-10-09 NOTE — Patient Instructions (Addendum)
Please go to the basement level to have your labs drawn.  We sent a prescription for Miralax generic to [facility] [address]   You have been scheduled for a CT scan of the abdomen and pelvis at [facility] ([address]---this is in the same building as Press [facility]).   You are scheduled on Tuesday 10-16-2013 at 9:00 am . You should arrive at 8:45 am prior to your appointment time for registration. Please follow the written instructions below on the day of your exam:  WARNING: IF YOU ARE ALLERGIC TO IODINE/X-RAY DYE, PLEASE NOTIFY RADIOLOGY IMMEDIATELY AT [phone]! YOU WILL BE GIVEN A 13 HOUR PREMEDICATION PREP.  1) Do not eat or drink anything after 5:00 am,  (4 hours prior to your test) 2) You have been given 2 bottles of oral contrast to drink. The solution may taste better if refrigerated, but do NOT add ice or any other liquid to this solution. Shake well before drinking.    Drink 1 bottle of contrast @ 7:00 am  (2 hours prior to your exam)  Drink 1 bottle of contrast @ 8:00 am  (1 hour prior to your exam)  You may take any medications as prescribed with a small amount of water except for the following: Metformin, Glucophage, Glucovance, Avandamet, Riomet, Fortamet, Actoplus Met, Janumet, Glumetza or Metaglip. The above medications must be held the day of the exam AND 48 hours after the exam.  The purpose of you drinking the oral contrast is to aid in the visualization of your intestinal tract. The contrast solution may cause some diarrhea. Before your exam is started, you will be given a small amount of fluid to drink. Depending on your individual set of symptoms, you may also receive an intravenous injection of x-ray contrast/dye. Plan on being at [facility] for 30 minutes or long, depending on the type of exam you are having performed.  If you have any questions regarding your exam or if you need to reschedule, you may call the CT department at  [phone] between the hours of 8:00 am and 5:00 pm, Monday-Friday.  ________________________________________________________________________

## 2013-10-10 NOTE — Progress Notes (Signed)
Reviewed and agree with management. Robert D. Kaplan, M.D., FACG  

## 2013-10-14 NOTE — Assessment & Plan Note (Signed)
Symptoms uchanged. Hard to figure out if she has true asthma or not. Unwilling to do CPST to evaluate dyspnea  PLAN  Try advair 100/50, 1 puff twice daily  Stop flovent REturn to see me in 6 months

## 2013-10-16 ENCOUNTER — Ambulatory Visit
Admission: RE | Admit: 2013-10-16 | Discharge: 2013-10-16 | Disposition: A | Discharge status: Home / Self Care | Payer: Medicaid Other | Source: Ambulatory Visit

## 2013-10-16 ENCOUNTER — Other Ambulatory Visit: Payer: Medicare Other

## 2013-10-16 DIAGNOSIS — Z1231 Encounter for screening mammogram for malignant neoplasm of breast: Secondary | ICD-10-CM

## 2013-10-17 ENCOUNTER — Ambulatory Visit (INDEPENDENT_AMBULATORY_CARE_PROVIDER_SITE_OTHER)
Admission: RE | Admit: 2013-10-17 | Discharge: 2013-10-17 | Disposition: A | Discharge status: Home / Self Care | Payer: Medicare Other | Source: Ambulatory Visit | Attending: Physician Assistant | Admitting: Physician Assistant

## 2013-10-17 ENCOUNTER — Other Ambulatory Visit: Payer: Self-pay | Admitting: *Deleted

## 2013-10-17 DIAGNOSIS — R109 Unspecified abdominal pain: Secondary | ICD-10-CM

## 2013-10-17 DIAGNOSIS — R1031 Right lower quadrant pain: Secondary | ICD-10-CM

## 2013-10-17 DIAGNOSIS — K5900 Constipation, unspecified: Secondary | ICD-10-CM

## 2013-10-17 MED ORDER — IOHEXOL 300 MG/ML  SOLN
100.0000 mL | Freq: Once | INTRAMUSCULAR | Status: AC | PRN
  Administered 2013-10-17: 100 mL via INTRAVENOUS

## 2013-10-18 ENCOUNTER — Encounter: Payer: Self-pay | Admitting: Gastroenterology

## 2013-10-29 ENCOUNTER — Encounter: Payer: Self-pay | Admitting: Family Medicine

## 2013-10-29 ENCOUNTER — Telehealth: Payer: Self-pay | Admitting: Family Medicine

## 2013-10-29 ENCOUNTER — Telehealth: Payer: Self-pay | Admitting: *Deleted

## 2013-10-29 DIAGNOSIS — Z794 Long term (current) use of insulin: Secondary | ICD-10-CM

## 2013-10-29 DIAGNOSIS — E119 Type 2 diabetes mellitus without complications: Secondary | ICD-10-CM | POA: Insufficient documentation

## 2013-10-29 NOTE — Telephone Encounter (Signed)
Pt wants to talk to dr hensel. She is out of her test strips and pharmacy will not refill until she has an appt w/ dr [name]. There are no appts until May 15 Please advise

## 2013-10-29 NOTE — Telephone Encounter (Signed)
Called patient to r/s appointment on 4/29 to 11/08/13 at 1 pm.

## 2013-10-29 NOTE — Telephone Encounter (Signed)
Done and Rx given to nurse

## 2013-10-29 NOTE — Telephone Encounter (Signed)
Rx faxed to [facility] @ fax# [phone].[place]

## 2013-10-31 ENCOUNTER — Telehealth: Payer: Self-pay | Admitting: Family Medicine

## 2013-10-31 ENCOUNTER — Ambulatory Visit: Payer: Medicare Other | Admitting: Neurology

## 2013-10-31 NOTE — Telephone Encounter (Signed)
Her knee is jumping out of joint. The bottom of her feet are hurting Wants dr [name] to call

## 2013-11-01 NOTE — Telephone Encounter (Signed)
Patient was advised to schedule appointment.She agrees,she request to see Dr Maricela Bo if Dr [name] had no openings.I went ahead and schedule her with Dr Maricela Bo for the 8th of May.I did offer her a sooner appointment but she needs to coordinate transportation.[place]

## 2013-11-08 ENCOUNTER — Ambulatory Visit (INDEPENDENT_AMBULATORY_CARE_PROVIDER_SITE_OTHER): Payer: Medicare Other | Admitting: Neurology

## 2013-11-08 ENCOUNTER — Encounter: Payer: Self-pay | Admitting: Neurology

## 2013-11-08 ENCOUNTER — Encounter (INDEPENDENT_AMBULATORY_CARE_PROVIDER_SITE_OTHER): Payer: Self-pay

## 2013-11-08 VITALS — BP 134/61 | HR 63 | Ht 63.5 in | Wt 292.0 lb

## 2013-11-08 DIAGNOSIS — G2401 Drug induced subacute dyskinesia: Secondary | ICD-10-CM

## 2013-11-08 DIAGNOSIS — R471 Dysarthria and anarthria: Secondary | ICD-10-CM

## 2013-11-08 MED ORDER — AMANTADINE HCL 100 MG PO CAPS: 100.0000 mg | ORAL_CAPSULE | Freq: Every day | ORAL | Status: DC

## 2013-11-08 NOTE — Patient Instructions (Signed)
Overall you are doing fairly well but I do want to suggest a few things today:   Remember to drink plenty of fluid, eat healthy meals and do not skip any meals. Try to eat protein with a every meal and eat a healthy snack such as fruit or nuts in between meals. Try to keep a regular sleep-wake schedule and try to exercise daily, particularly in the form of walking, 20-30 minutes a day, if you can.   As far as your medications are concerned, I would like to suggest the following: 1)Please try amantadine 100mg  daily. Please by aware that this medication can cause fatigue and/or confusion. Please notify our office immediately if you develop any side effects  I would like you to work with speech therapy. They will call you to schedule.   I would like to see you back in 4 months. Please call us with any interim questions, concerns, problems, updates or refill requests.   My clinical assistant and will answer any of your questions and relay your messages to me and also relay most of my messages to you.   Our phone number is [phone]. We also have an after hours call service for urgent matters and there is a physician on-call for urgent questions. For any emergencies you know to call 911 or go to the nearest emergency room

## 2013-11-08 NOTE — Progress Notes (Signed)
[facility]    Provider:  Dr Janann Colonel Referring Provider: Zigmund Gottron,* Primary Care Physician:  Zigmund Gottron, MD  CC:  Abnormal involuntary movements  HPI:  Brittany Roberts is a 73 y.o. female here as a referral from Dr. [name] for abnormal involuntary movements of her face. Reports the symptoms began around 1 year ago. At that time she had been taking reglan daily for a few years prior to symptoms. Was taking for severe GERD and stopped once she developed symptoms. Her symptoms improved slightly since stopping reglan but has not resolved completely. She notes occasional episodes of difficulty swallowing and talking. She has noted a change in her speech, feels it is more slurred and has become more high pitched. Is harder to talk. Notes symptoms are just peri-oral, does not involve the periorbital region. Has not tried any medication for these symptoms.   She was recently evaluated by ENT for change in voice. They suspect symptoms likely related to history of GERD and also possibly related to TD.   She notes history of fibromyalgia, SLE and arthritis.   Review of Systems: Out of a complete 14 system review, the patient complains of only the following symptoms, and all other reviewed systems are negative. + weight gain, blurred vision, easy bruising, memory loss, confusion, slurred speech, snoring, restless legs  History   Social History  . Marital Status: Single    Spouse Name: N/A    Number of Children: 4  . Years of Education: N/A   Occupational History  . RetiredMedia planner    Social History Main Topics  . Smoking status: Former Smoker -- 3.00 packs/day for 20 years    Types: Cigarettes    Quit date: 07/06/1999  . Smokeless tobacco: Never Used  . Alcohol Use: No  . Drug Use: No  . Sexual Activity: Not Currently   Other Topics Concern  . Not on file   Social History Narrative   Health Care POA:    Emergency Contact: daughter,  Lorenso Courier [phone] or [name] [phone]   End of Life Plan: Does not want to be ventilated or feeding tubes.    Who lives with you: Lives with daughter Lorenso Courier   Any pets: none   Diet: Patient reports enjoying and eating junk food.  Does not regulate types of food and currently her dentures are broken so it is hard to eat some foods.   Exercise: Patient does not have an exercise plan.   Seatbelts: Patient reports wearing seatbelt when in vehicle.   Sun Exposure/Protection: Patient reports not going outside very often.   Hobbies: Patient enjoys reading the bible and watching game shows.           Family History  Problem Relation Age of Onset  . Heart disease Mother   . Pneumonia Father   . Diabetes Sister   . Diabetes Sister   . Diabetes Mother   . Clotting disorder Mother   . Rheum arthritis Father   . Asthma Brother     Past Medical History  Diagnosis Date  . Depression   . Lupus   . Asthma   . Diabetes mellitus   . Hypertension   . Hypothyroidism   . Congestive heart failure, unspecified   . Other and unspecified hyperlipidemia   . Unspecified vitamin D deficiency   . Renal failure, unspecified   . OSA (obstructive sleep apnea)     Past Surgical History  Procedure Laterality Date  . Tubal  ligation    . Nasal sinus surgery    . Gallbladder surgery      Current Outpatient Prescriptions  Medication Sig Dispense Refill  . amLODipine (NORVASC) 10 MG tablet Take 1 tablet (10 mg total) by mouth daily.  90 tablet  3  . aspirin 81 MG tablet Take 1 tablet (81 mg total) by mouth daily.  30 tablet    . atorvastatin (LIPITOR) 40 MG tablet Take 1 tablet (40 mg total) by mouth daily.  90 tablet  3  . B-D INS SYRINGE 0.5CC/30GX1/2" 30G X 1/2" 0.5 ML MISC use as directed  100 each  PRN  . escitalopram (LEXAPRO) 20 MG tablet Take 1 tablet (20 mg total) by mouth daily.  90 tablet  3  . furosemide (LASIX) 40 MG tablet Take 1 tablet (40 mg total) by mouth daily.  90 tablet   3  . gabapentin (NEURONTIN) 600 MG tablet Take 1 tablet (600 mg total) by mouth 3 (three) times daily.  270 tablet  3  . Glucosamine-Chondroit-Vit C-Mn (GLUCOSAMINE CHONDR 1500 COMPLX PO) Take 1 tablet by mouth 2 (two) times daily.      Marland Kitchen glucose blood test strip 1 each by Other route as needed for other. Use as instructed      . insulin NPH Human (HUMULIN N,NOVOLIN N) 100 UNIT/ML injection Inject 0.2 mLs (20 Units total) into the skin 2 (two) times daily before a meal.  50 mL  3  . isosorbide mononitrate (IMDUR) 60 MG 24 hr tablet Take 1 tablet (60 mg total) by mouth daily.  90 tablet  3  . levothyroxine (SYNTHROID, LEVOTHROID) 50 MCG tablet Take 50 mcg by mouth daily before breakfast.      . metoprolol tartrate (LOPRESSOR) 25 MG tablet Take 0.5 tablets (12.5 mg total) by mouth 2 (two) times daily.  90 tablet  3  . mometasone (NASONEX) 50 MCG/ACT nasal spray Place 2 sprays into the nose daily.  51 g  3  . nitroGLYCERIN (NITROSTAT) 0.4 MG SL tablet Place 1 tablet (0.4 mg total) under the tongue every 5 (five) minutes as needed for chest pain.  20 tablet  12  . Polyethyl Glycol-Propyl Glycol (SYSTANE OP) Place 1 drop into both eyes 4 (four) times daily.      . polyethylene glycol powder (GLYCOLAX/MIRALAX) powder Take 17 grams in 8 0Z of water or juice daily.  255 g  2  . spironolactone (ALDACTONE) 25 MG tablet Take 1 tablet (25 mg total) by mouth 2 (two) times daily.  180 tablet  3  . traMADol (ULTRAM) 50 MG tablet Take 1 tablet (50 mg total) by mouth 4 (four) times daily.  120 tablet  5  . traZODone (DESYREL) 50 MG tablet Take 1 tablet (50 mg total) by mouth at bedtime as needed for sleep.  90 tablet  3   No current facility-administered medications for this visit.    Allergies as of 11/08/2013 - Review Complete 11/08/2013  Allergen Reaction Noted  . Pregabalin    . Ropinirole hydrochloride  03/07/2007  . Penicillins Swelling and Rash 03/07/2007    Vitals: BP 134/61  Pulse 63  Ht 5'  3.5" (1.613 m)  Wt 292 lb (132.45 kg)  BMI 50.91 kg/m2 Last Weight:  Wt Readings from Last 1 Encounters:  11/08/13 292 lb (132.45 kg)   Last Height:   Ht Readings from Last 1 Encounters:  11/08/13 5' 3.5" (1.613 m)     Physical exam: Exam: Gen: NAD,  conversant Eyes: anicteric sclerae, moist conjunctivae HENT: Atraumatic, oropharynx clear Neck: Trachea midline; supple,  Lungs: CTA, no wheezing, rales, rhonic                          CV: RRR, no MRG Abdomen: Soft, non-tender;  Extremities: No peripheral edema  Skin: Normal temperature, no rash,  Psych: Appropriate affect, pleasant  Neuro: MS: AA&Ox3, appropriately interactive, normal affect   Attention: WORLD backwards  Speech: fluent w/o paraphasic error  Memory: good recent and remote recall  CN: Noted continuous dyskinesias of perioral region with intermittent frequent tongue extension  PERRL, EOMI no nystagmus, no ptosis, sensation intact to LT V1-V3 bilat, face symmetric, no weakness, hearing grossly intact, palate elevates symmetrically, shoulder shrug 5/5 bilat,  tongue protrudes midline, no fasiculations noted.  Motor: normal bulk and tone Strength: 5/5  In all extremities  Reflexes: diminished, absent achilles bilaterally, bilat downgoing toes  Sens: LT intact in all extremities, patient notes severe tenderness/pain to LT  Gait: she defers walking at this time due to pain   Assessment:  After physical and neurologic examination, review of laboratory studies, imaging, neurophysiology testing and pre-existing records, assessment will be reviewed on the problem list.  Plan:  Treatment plan and additional workup will be reviewed under Problem List.  1)Tardive dyskinesia 2)Dysarthria  73y/o woman presenting for initial evaluation of abnormal involuntary movements related to prolonged use of reglan. Based on history and exam, these movements are most consistent with a diagnosis of tardive dyskinesia.  Discussed likely diagnosis and prognosis with the patient who expresses understanding. Discussed different treatment options, will try low dose amantadine 100mg  daily. If tolerating can consider titrating up but will need to do so cautiously based on age and elevated Cr. Can consider tetrabenazine if no improvement. SLT referral for swallowing and speech difficulties. Follow up in 4 months.   Jim Like, DO  [facility] [address] [place], [place] [address]  Phone [phone] Fax [phone]

## 2013-11-09 ENCOUNTER — Ambulatory Visit (INDEPENDENT_AMBULATORY_CARE_PROVIDER_SITE_OTHER): Payer: Medicare Other | Admitting: Family Medicine

## 2013-11-09 ENCOUNTER — Encounter: Payer: Self-pay | Admitting: Family Medicine

## 2013-11-09 VITALS — BP 140/72 | HR 57 | Temp 98.7°F | Wt 290.5 lb

## 2013-11-09 DIAGNOSIS — E119 Type 2 diabetes mellitus without complications: Secondary | ICD-10-CM

## 2013-11-09 DIAGNOSIS — R52 Pain, unspecified: Secondary | ICD-10-CM

## 2013-11-09 DIAGNOSIS — Q619 Cystic kidney disease, unspecified: Secondary | ICD-10-CM | POA: Insufficient documentation

## 2013-11-09 DIAGNOSIS — N281 Cyst of kidney, acquired: Secondary | ICD-10-CM | POA: Insufficient documentation

## 2013-11-09 DIAGNOSIS — M329 Systemic lupus erythematosus, unspecified: Secondary | ICD-10-CM

## 2013-11-09 LAB — POCT URINALYSIS DIPSTICK
Bilirubin, UA: NEGATIVE
Blood, UA: NEGATIVE
Glucose, UA: NEGATIVE
Ketones, UA: NEGATIVE
Nitrite, UA: NEGATIVE
Protein, UA: NEGATIVE
Spec Grav, UA: <=1.005
Urobilinogen, UA: 0.2
pH, UA: 5.5

## 2013-11-09 LAB — POCT UA - MICROSCOPIC ONLY

## 2013-11-09 LAB — POCT GLYCOSYLATED HEMOGLOBIN (HGB A1C): Hemoglobin A1C: 7.4

## 2013-11-09 MED ORDER — GLUCOSE BLOOD VI STRP: ORAL_STRIP | Status: DC

## 2013-11-09 NOTE — Progress Notes (Signed)
   Subjective:    Patient ID: Brittany Roberts, female    DOB: 1941-06-30, 73 y.o.   MRN: [id number]  HPI  73 year old F with morbid obesity, hx of polymyalgia rheumatica and chronic joint pain who presents for evaluation. She is complaining for right knee pain, right thumb pain, and right great toe pain. She then goes on to say that she also has pain in her arms and difficulty lifting her arms. She also states that her she was told by her PCP that she has fibromyalgia. She has taken prednisone in the past for PMR, which helped but drove her sugar up. She also takes tramadol 50 mg QID and state that is helps "a little."  Of all of the areas that she complained about, she said the one that hurt her the most was her right knee.   Right knee pain - anterior pain, superior, medial and lateral; several months duration, pt does have a history of a fall in 2012 (when she broke her ankle); has had glucocorticoid injections without improvement; she does take glucosamine and chondrointin   PSH - has a history of ligament repair in 1987 (no records)  Review of Systems     Objective:   Physical Exam BP 140/72  Pulse 57  Temp(Src) 98.7 F (37.1 C) (Oral)  Wt 290 lb 8 oz (131.77 kg)  Gen: obese elderly African American female Neuro: tardive dyskinesia Musculoskeletal: exquisitely sensitive to mild palpation of the medial, lateral, and posterior adipose tissue surrounding her right knee; she does have crepitus of the knee but no effusion, no laxity, and full range of motion; her sensitivity is not actually in the joint but the surrounding tissues; moreover she is generally tender in her right thigh, both arms bilaterally and almost every place that I touch Skin: no rashes or bruising      Assessment & Plan:

## 2013-11-09 NOTE — Patient Instructions (Signed)
Dear Ms. Mayfield,   Thank you for coming to clinic today. Please read below regarding the issues that we discussed.   1. Muscle Pain - This may be due to fibromyalgia. It may also be due to your lipitor. You should break the tablets in half for the next two weeks to see if that improves it.   2. Diabetes - I will refill the prescription for the test strips. Your hemoglobin is 7.4.    Please follow up in clinic in 2 weeks. Please call earlier if you have any questions or concerns.   Sincerely,   Dr. Maricela Bo

## 2013-11-09 NOTE — Assessment & Plan Note (Signed)
Given her age she likely has a component of osteoarthritis, but she is exquisitely sensitive in her adipose tissue which is more consistent with fibromyalgia or primary pain syndrome for which there is no safe or effective treatment for this patient; she is already taking Neurontin at a high dose and tramadol for high-dose; the only further adjustment I could propose was to decrease her Lipitor from 40 mg to 20 mg to see if her myalgia improved; she is agreeable to this plan

## 2013-11-19 ENCOUNTER — Encounter (HOSPITAL_COMMUNITY): Payer: Self-pay | Admitting: Pharmacy Technician

## 2013-11-21 ENCOUNTER — Telehealth: Payer: Self-pay | Admitting: Neurology

## 2013-11-21 NOTE — Telephone Encounter (Signed)
Please let her know that skin discoloration can be a side effect from amantadine use. She should discontinue the amantadine.

## 2013-11-21 NOTE — Telephone Encounter (Signed)
Spoke with patient and she said that the knot appeared after starting the medication. It is located on the lower right arm and it is sore, does't know when the browning of the skin occurred on her body.

## 2013-11-21 NOTE — Telephone Encounter (Signed)
Patient wanted to inform Dr Janann Colonel, amantadine (SYMMETREL) 100 MG capsule she's taking has caused browning of skin on her body and a knot has appeared on her arm.  She's not experiencing any discomfort from the knot, but would like a return call.  Thanks

## 2013-11-22 NOTE — Telephone Encounter (Signed)
Shared message with patient, she wanted to know was he going to call her something else in since he has discontinued the amantadine

## 2013-11-22 NOTE — Telephone Encounter (Signed)
Called pt to inform her per Dr. Rexene Alberts that Dr. Janann Colonel in out of the office today and the [facility], Dr. Rexene Alberts stated that Dr. Janann Colonel will discuss with pt about starting a new medication, but Dr. Rexene Alberts recommends stopping Amantadine for days before starting a new medication to make sure reactions are cleared up. I advised the pt that if she has any other problems, questions or concerns to call the office. Pt verbalized understanding. FYI

## 2013-11-22 NOTE — Telephone Encounter (Signed)
Please advise patient that Dr. Janann Colonel will probably discuss starting a new medication for her but I would recommend that she be off of her amantadine for at least 10 days before they discuss starting a new medication to make sure her reaction has subsided.

## 2013-11-23 ENCOUNTER — Ambulatory Visit (INDEPENDENT_AMBULATORY_CARE_PROVIDER_SITE_OTHER): Payer: Medicare Other | Admitting: Family Medicine

## 2013-11-23 VITALS — BP 141/77 | HR 64 | Temp 98.6°F | Wt 286.3 lb

## 2013-11-23 DIAGNOSIS — E1149 Type 2 diabetes mellitus with other diabetic neurological complication: Secondary | ICD-10-CM

## 2013-11-23 DIAGNOSIS — E1140 Type 2 diabetes mellitus with diabetic neuropathy, unspecified: Secondary | ICD-10-CM

## 2013-11-23 DIAGNOSIS — R52 Pain, unspecified: Secondary | ICD-10-CM

## 2013-11-23 DIAGNOSIS — E785 Hyperlipidemia, unspecified: Secondary | ICD-10-CM

## 2013-11-23 DIAGNOSIS — E1142 Type 2 diabetes mellitus with diabetic polyneuropathy: Secondary | ICD-10-CM

## 2013-11-23 DIAGNOSIS — I2510 Atherosclerotic heart disease of native coronary artery without angina pectoris: Secondary | ICD-10-CM

## 2013-11-23 DIAGNOSIS — I10 Essential (primary) hypertension: Secondary | ICD-10-CM

## 2013-11-23 MED ORDER — ATORVASTATIN CALCIUM 20 MG PO TABS: 20.0000 mg | ORAL_TABLET | Freq: Every day | ORAL | Status: DC

## 2013-11-23 NOTE — Patient Instructions (Signed)
Dear Ms. Astudillo,   Thank you for coming to clinic today. Please read below regarding the issues that we discussed.   1. Urine - We are going to check protein to make sure the diabetes is not impacting the kidneys  2. Pain control - You have neuropathy of the legs. So, in the future we can consider starting the medication called Cymbalta and stopped the Lexapro and Gabapentin.   Please follow up in clinic in 2 months. Please call earlier if you have any questions or concerns.   Sincerely,   Dr. Maricela Bo

## 2013-11-23 NOTE — Telephone Encounter (Signed)
Called pt to inform her per Dr. Janann Colonel to call our office in 10 days or when the pt's rash clears up. I advised the pt that if she has any other problems, questions or concerns to call the office. Pt verbalized understanding.

## 2013-11-23 NOTE — Telephone Encounter (Signed)
Please have her call our office in 10 days or when the rash clears up. Thank you.

## 2013-11-23 NOTE — Progress Notes (Signed)
Subjective:    Patient ID: Brittany Roberts, female    DOB: 06/09/1941, 73 y.o.   MRN: [id number]  HPI  73 year old F with complex medical status including DM, morbid obesity, hx of polymyalgia rheumatica and chronic joint pain who presents for evaluation.  Chronic Pain - Pt evaluated for this two weeks ago and it was decided to reduce her statin from 40 mg daily to 20 mg daily. Today she presents for follow up.  She states that the pain is much improved. She only has pain in her right forearm today. She is also complaining of neuropathy in her feet and does not know if gabapentin is helping.   DM - Pt with history of DM, type 2 and diabetic neuropathy; Moderated control with last A1C of 7.4 on regimen of insulin NPH 30 units q AM. Uncertain as to why this is the case, b/c she is seen by her PCP for DM. She denies any recent low blood sugars. She needs urine microalbumin checked today.   Lab Results  Component Value Date   HGBA1C 7.4 11/09/2013   Pain in her feet -   Current Outpatient Prescriptions on File Prior to Visit  Medication Sig Dispense Refill  . ALPHA LIPOIC ACID PO Take 1 tablet by mouth daily.      Marland Kitchen amantadine (SYMMETREL) 100 MG capsule Take 1 capsule (100 mg total) by mouth daily.  60 capsule  3  . amLODipine (NORVASC) 10 MG tablet Take 10 mg by mouth every morning.      Marland Kitchen aspirin EC 81 MG tablet Take 81 mg by mouth daily.      Marland Kitchen atorvastatin (LIPITOR) 40 MG tablet Take 1 tablet (40 mg total) by mouth daily.  90 tablet  3  . b complex vitamins capsule Take 1 capsule by mouth daily.      . B-D INS SYRINGE 0.5CC/30GX1/2" 30G X 1/2" 0.5 ML MISC use as directed  100 each  PRN  . Biotin 5000 MCG TABS Take 5,000 mcg by mouth daily.      . cholecalciferol (VITAMIN D) 1000 UNITS tablet Take 1,000 Units by mouth daily.      Marland Kitchen escitalopram (LEXAPRO) 20 MG tablet Take 20 mg by mouth every morning.      . furosemide (LASIX) 40 MG tablet Take 1 tablet (40 mg total) by mouth daily.  90  tablet  3  . gabapentin (NEURONTIN) 600 MG tablet Take 1 tablet (600 mg total) by mouth 3 (three) times daily.  270 tablet  3  . Glucosamine-Chondroit-Vit C-Mn (GLUCOSAMINE CHONDR 1500 COMPLX PO) Take 1 tablet by mouth 2 (two) times daily.      Marland Kitchen glucose blood test strip Check blood sugar three times per day.  300 each  10  . insulin NPH Human (HUMULIN N,NOVOLIN N) 100 UNIT/ML injection Inject 30 Units into the skin every morning.      . isosorbide mononitrate (IMDUR) 60 MG 24 hr tablet Take 60 mg by mouth every morning.      Marland Kitchen levothyroxine (SYNTHROID, LEVOTHROID) 50 MCG tablet Take 50 mcg by mouth daily before breakfast.      . metoprolol tartrate (LOPRESSOR) 25 MG tablet Take 0.5 tablets (12.5 mg total) by mouth 2 (two) times daily.  90 tablet  3  . mometasone (NASONEX) 50 MCG/ACT nasal spray Place 2 sprays into the nose daily.  51 g  3  . nitroGLYCERIN (NITROSTAT) 0.4 MG SL tablet Place 1 tablet (0.4  mg total) under the tongue every 5 (five) minutes as needed for chest pain.  20 tablet  12  . Polyethyl Glycol-Propyl Glycol (SYSTANE OP) Place 1 drop into both eyes 4 (four) times daily.      . polyethylene glycol powder (GLYCOLAX/MIRALAX) powder Take 17 grams in 8 0Z of water or juice daily.  255 g  2  . spironolactone (ALDACTONE) 25 MG tablet Take 1 tablet (25 mg total) by mouth 2 (two) times daily.  180 tablet  3  . traMADol (ULTRAM) 50 MG tablet Take 1 tablet (50 mg total) by mouth 4 (four) times daily.  120 tablet  5  . traZODone (DESYREL) 50 MG tablet Take 1 tablet (50 mg total) by mouth at bedtime as needed for sleep.  90 tablet  3  . VITAMIN A PO Take 1 tablet by mouth daily.       No current facility-administered medications on file prior to visit.     Review of Systems Negative unless stated in HPI     Objective:   Physical Exam BP 141/77  Pulse 64  Temp(Src) 98.6 F (37 C) (Oral)  Wt 286 lb 4.8 oz (129.865 kg)  Gen: obese elderly African American female  CV: RRR, no  murmurs Neuro: tardive dyskinesia  Skin: tender ecchymosis on right forearm        Assessment & Plan:  > 30 minutes spent counseling patient on causes of pain and potential changes to medical regimen that could be used for this issue

## 2013-11-24 LAB — MICROALBUMIN / CREATININE URINE RATIO
Creatinine, Urine: 56.1 mg/dL
Microalb Creat Ratio: 32.4 mg/g — ABNORMAL HIGH (ref 0.0–30.0)
Microalb, Ur: 1.82 mg/dL (ref 0.00–1.89)

## 2013-11-27 ENCOUNTER — Telehealth: Payer: Self-pay | Admitting: *Deleted

## 2013-11-27 ENCOUNTER — Encounter (HOSPITAL_COMMUNITY): Payer: Self-pay | Admitting: *Deleted

## 2013-11-27 DIAGNOSIS — E1140 Type 2 diabetes mellitus with diabetic neuropathy, unspecified: Secondary | ICD-10-CM | POA: Insufficient documentation

## 2013-11-27 NOTE — Assessment & Plan Note (Signed)
A: continue current regimen of insulin NPH P: check urine microalbumin today to see if patient needs to be started on an ACE-I or an ARB

## 2013-11-27 NOTE — Assessment & Plan Note (Signed)
Since pt has less muscle pain on atorvastatin 20 mg, I will change dose to 20 mg from 40 mg.

## 2013-11-27 NOTE — Assessment & Plan Note (Signed)
Pt counseled extensively on chronic pain issues. After much discussion, she decided not to try cymbalta in place of neurontin + lexapro. This can be decided in the future.

## 2013-11-27 NOTE — Telephone Encounter (Signed)
Attempted to call patient. No answer will call later

## 2013-11-27 NOTE — Telephone Encounter (Signed)
Message copied by Johny Shears on Tue Nov 27, 2013  5:13 PM ------      Message from: Zenia Resides      Created: Tue Nov 27, 2013  5:06 PM       [name],       Please call Ms Ratledge and ask her to make an appointment to see me.      ----- Message -----         From: Angelica Ran, MD         Sent: 11/27/2013  11:14 AM           To: Zigmund Gottron, MD            Dr. [name],       Your patient has started coming to me for chronic issues. I checked her urine microalbumin b/c she has diabetes and not on an ACE-I or ARB. Would you like to address this or would you like me to? Thanks, Lucianne Lei             ----- Message -----         From: [facility]         Sent: 11/24/2013   6:07 AM           To: Angelica Ran, MD                         ------

## 2013-11-28 NOTE — Telephone Encounter (Signed)
Appointment made for Friday 5/29 at 10:15am with Dr. [name].Brittany Roberts

## 2013-11-30 ENCOUNTER — Ambulatory Visit (INDEPENDENT_AMBULATORY_CARE_PROVIDER_SITE_OTHER): Payer: Medicare Other | Admitting: Family Medicine

## 2013-11-30 ENCOUNTER — Encounter: Payer: Self-pay | Admitting: Family Medicine

## 2013-11-30 VITALS — BP 143/48 | HR 69 | Ht 63.0 in | Wt 286.3 lb

## 2013-11-30 DIAGNOSIS — I10 Essential (primary) hypertension: Secondary | ICD-10-CM

## 2013-11-30 DIAGNOSIS — N182 Chronic kidney disease, stage 2 (mild): Secondary | ICD-10-CM

## 2013-11-30 DIAGNOSIS — E1149 Type 2 diabetes mellitus with other diabetic neurological complication: Secondary | ICD-10-CM

## 2013-11-30 DIAGNOSIS — K219 Gastro-esophageal reflux disease without esophagitis: Secondary | ICD-10-CM

## 2013-11-30 DIAGNOSIS — F329 Major depressive disorder, single episode, unspecified: Secondary | ICD-10-CM

## 2013-11-30 DIAGNOSIS — F3289 Other specified depressive episodes: Secondary | ICD-10-CM

## 2013-11-30 MED ORDER — LISINOPRIL 10 MG PO TABS: 10.0000 mg | ORAL_TABLET | Freq: Every day | ORAL | Status: DC

## 2013-11-30 MED ORDER — DULOXETINE HCL 30 MG PO CPEP: 30.0000 mg | ORAL_CAPSULE | Freq: Every day | ORAL | Status: DC

## 2013-11-30 MED ORDER — PANTOPRAZOLE SODIUM 40 MG PO TBEC: 40.0000 mg | DELAYED_RELEASE_TABLET | Freq: Two times a day (BID) | ORAL | Status: DC

## 2013-11-30 NOTE — Assessment & Plan Note (Signed)
Restart ACE

## 2013-11-30 NOTE — Assessment & Plan Note (Signed)
Wants to switch to cymbalta so she can drop lexapro and gabapentin.

## 2013-11-30 NOTE — Progress Notes (Signed)
   Subjective:    Patient ID: Brittany Roberts, female    DOB: Jan 01, 1941, 73 y.o.   MRN: [id number]  HPI  Lots of complaints - primarily wants to switch doctors because she thinks I am ignoring her problems.   Seen be ENT and Neuro for voice changes thought to be due primarily to tradive dyskinesia and perhaps an element of reflux.  Could not tolerate amantadine. C/O severe heartburn despite daily protonix I actually asked to come in to restart her ACE.  Somehow it go dropped and she needs for renal protection.    Review of Systems     Objective:   Physical Exam BP noted Cordial but distant.  Discussed her concerns and she still wants to switch doctors. Abd benign.        Assessment & Plan:

## 2013-11-30 NOTE — Patient Instructions (Addendum)
I sent in a refill for your protonix which takes the place of nexium and helps with the stomach acid reflux.  I want you to start taking it twice a day. I added the lisinopril back - sent in a new prescription.  It protects your kidneys and helps your blood pressure.  You need to be on this medicine. One more change: We are going to try cymbalta/duloxetine in place of the lexapro and the gabapentin.  If it works as well or better, great.  If it does not work as well, we will go back to the old medicine.  Give it a month or two to see how it is working.

## 2013-11-30 NOTE — Assessment & Plan Note (Signed)
Restart ace

## 2013-11-30 NOTE — Assessment & Plan Note (Signed)
Wants to try to switch from lexapro to cymbalta so that she can drop her gabapentin.

## 2013-12-03 ENCOUNTER — Telehealth: Payer: Self-pay

## 2013-12-03 ENCOUNTER — Ambulatory Visit (AMBULATORY_SURGERY_CENTER): Payer: Medicaid Other

## 2013-12-03 VITALS — Ht 63.0 in | Wt 290.0 lb

## 2013-12-03 DIAGNOSIS — Z860100 Personal history of colon polyps, unspecified: Secondary | ICD-10-CM

## 2013-12-03 MED ORDER — SUPREP BOWEL PREP KIT 17.5-3.13-1.6 GM/177ML PO SOLN: 1.0000 | Freq: Once | ORAL | Status: DC

## 2013-12-03 NOTE — Telephone Encounter (Signed)
Confirmed BMI>50 and mobility challenging for activities of daily living including dressing self and transferring off and on stretcher.

## 2013-12-04 ENCOUNTER — Telehealth: Payer: Self-pay | Admitting: Family Medicine

## 2013-12-07 ENCOUNTER — Telehealth: Payer: Self-pay | Admitting: Family Medicine

## 2013-12-07 NOTE — Progress Notes (Signed)
Patient  called reporting her legs are more swollen and she was told to call and report this by her [employer] nurse, patient given doctor kaplan's nurse Endicott hunt phone number [phone] to call and report this finding to dr Deatra Ina.

## 2013-12-07 NOTE — Telephone Encounter (Signed)
Pt called and wanted Dr. [name] to know that she is having a colonoscopy on 6/8. She also said that her acid reflux in acting up and that her ankles are a little swollen. jw

## 2013-12-10 ENCOUNTER — Encounter (HOSPITAL_COMMUNITY)
Admission: RE | Disposition: A | Discharge status: Home / Self Care | Payer: Self-pay | Source: Ambulatory Visit | Attending: Gastroenterology

## 2013-12-10 ENCOUNTER — Ambulatory Visit (HOSPITAL_COMMUNITY)
Admission: RE | Admit: 2013-12-10 | Discharge: 2013-12-10 | Disposition: A | Discharge status: Home / Self Care | Payer: PRIVATE HEALTH INSURANCE | Source: Ambulatory Visit | Attending: Gastroenterology | Admitting: Gastroenterology

## 2013-12-10 ENCOUNTER — Encounter (HOSPITAL_COMMUNITY): Payer: Self-pay | Admitting: Gastroenterology

## 2013-12-10 ENCOUNTER — Encounter (HOSPITAL_COMMUNITY): Payer: PRIVATE HEALTH INSURANCE | Admitting: Anesthesiology

## 2013-12-10 ENCOUNTER — Ambulatory Visit (HOSPITAL_COMMUNITY): Payer: PRIVATE HEALTH INSURANCE | Admitting: Anesthesiology

## 2013-12-10 DIAGNOSIS — K5900 Constipation, unspecified: Secondary | ICD-10-CM

## 2013-12-10 DIAGNOSIS — I509 Heart failure, unspecified: Secondary | ICD-10-CM | POA: Insufficient documentation

## 2013-12-10 DIAGNOSIS — K219 Gastro-esophageal reflux disease without esophagitis: Secondary | ICD-10-CM | POA: Insufficient documentation

## 2013-12-10 DIAGNOSIS — E039 Hypothyroidism, unspecified: Secondary | ICD-10-CM | POA: Insufficient documentation

## 2013-12-10 DIAGNOSIS — J449 Chronic obstructive pulmonary disease, unspecified: Secondary | ICD-10-CM | POA: Insufficient documentation

## 2013-12-10 DIAGNOSIS — F3289 Other specified depressive episodes: Secondary | ICD-10-CM | POA: Insufficient documentation

## 2013-12-10 DIAGNOSIS — D126 Benign neoplasm of colon, unspecified: Secondary | ICD-10-CM | POA: Insufficient documentation

## 2013-12-10 DIAGNOSIS — R198 Other specified symptoms and signs involving the digestive system and abdomen: Secondary | ICD-10-CM | POA: Insufficient documentation

## 2013-12-10 DIAGNOSIS — I129 Hypertensive chronic kidney disease with stage 1 through stage 4 chronic kidney disease, or unspecified chronic kidney disease: Secondary | ICD-10-CM | POA: Insufficient documentation

## 2013-12-10 DIAGNOSIS — N182 Chronic kidney disease, stage 2 (mild): Secondary | ICD-10-CM | POA: Insufficient documentation

## 2013-12-10 DIAGNOSIS — Z794 Long term (current) use of insulin: Secondary | ICD-10-CM | POA: Insufficient documentation

## 2013-12-10 DIAGNOSIS — I2510 Atherosclerotic heart disease of native coronary artery without angina pectoris: Secondary | ICD-10-CM | POA: Insufficient documentation

## 2013-12-10 DIAGNOSIS — T450X5A Adverse effect of antiallergic and antiemetic drugs, initial encounter: Secondary | ICD-10-CM | POA: Insufficient documentation

## 2013-12-10 DIAGNOSIS — G4730 Sleep apnea, unspecified: Secondary | ICD-10-CM | POA: Insufficient documentation

## 2013-12-10 DIAGNOSIS — M1990 Unspecified osteoarthritis, unspecified site: Secondary | ICD-10-CM | POA: Insufficient documentation

## 2013-12-10 DIAGNOSIS — J4489 Other specified chronic obstructive pulmonary disease: Secondary | ICD-10-CM | POA: Insufficient documentation

## 2013-12-10 DIAGNOSIS — E1142 Type 2 diabetes mellitus with diabetic polyneuropathy: Secondary | ICD-10-CM | POA: Insufficient documentation

## 2013-12-10 DIAGNOSIS — F329 Major depressive disorder, single episode, unspecified: Secondary | ICD-10-CM | POA: Insufficient documentation

## 2013-12-10 DIAGNOSIS — Z7982 Long term (current) use of aspirin: Secondary | ICD-10-CM | POA: Insufficient documentation

## 2013-12-10 DIAGNOSIS — R109 Unspecified abdominal pain: Secondary | ICD-10-CM

## 2013-12-10 DIAGNOSIS — G2401 Drug induced subacute dyskinesia: Secondary | ICD-10-CM | POA: Insufficient documentation

## 2013-12-10 DIAGNOSIS — E1149 Type 2 diabetes mellitus with other diabetic neurological complication: Secondary | ICD-10-CM | POA: Insufficient documentation

## 2013-12-10 HISTORY — PX: COLONOSCOPY: SHX5424

## 2013-12-10 LAB — GLUCOSE, CAPILLARY: Glucose-Capillary: 152 mg/dL — ABNORMAL HIGH (ref 70–99)

## 2013-12-10 SURGERY — COLONOSCOPY
Anesthesia: Monitor Anesthesia Care

## 2013-12-10 MED ORDER — LACTATED RINGERS IV SOLN: INTRAVENOUS | Status: DC

## 2013-12-10 MED ORDER — PROPOFOL 10 MG/ML IV BOLUS
INTRAVENOUS | Status: AC
  Filled 2013-12-10: qty 20

## 2013-12-10 MED ORDER — SODIUM CHLORIDE 0.9 % IV SOLN: INTRAVENOUS | Status: DC

## 2013-12-10 MED ORDER — PROPOFOL 10 MG/ML IV BOLUS
INTRAVENOUS | Status: DC | PRN
  Administered 2013-12-10: 50 mg via INTRAVENOUS
  Administered 2013-12-10: 25 mg via INTRAVENOUS
  Administered 2013-12-10 (×2): 50 mg via INTRAVENOUS

## 2013-12-10 MED ORDER — LACTATED RINGERS IV SOLN
INTRAVENOUS | Status: DC | PRN
  Administered 2013-12-10: 11:00:00 via INTRAVENOUS

## 2013-12-10 NOTE — Anesthesia Postprocedure Evaluation (Signed)
  Anesthesia Post-op Note  Patient: Brittany Roberts  Procedure(s) Performed: Procedure(s) (LRB): COLONOSCOPY (N/A)  Patient Location: PACU  Anesthesia Type: MAC  Level of Consciousness: awake and alert   Airway and Oxygen Therapy: Patient Spontanous Breathing  Post-op Pain: mild  Post-op Assessment: Post-op Vital signs reviewed, Patient's Cardiovascular Status Stable, Respiratory Function Stable, Patent Airway and No signs of Nausea or vomiting  Last Vitals:  Filed Vitals:   12/10/13 1215  BP: 164/68  Pulse: 59  Temp:   Resp: 21    Post-op Vital Signs: stable   Complications: No apparent anesthesia complications

## 2013-12-10 NOTE — Discharge Instructions (Signed)
Colon Polyps Polyps are lumps of extra tissue growing inside the body. Polyps can grow in the large intestine (colon). Most colon polyps are noncancerous (benign). However, some colon polyps can become cancerous over time. Polyps that are larger than a pea may be harmful. To be safe, caregivers remove and test all polyps. CAUSES  Polyps form when mutations in the genes cause your cells to grow and divide even though no more tissue is needed. RISK FACTORS There are a number of risk factors that can increase your chances of getting colon polyps. They include:  Being older than 50 years.  Family history of colon polyps or colon cancer.  Long-term colon diseases, such as colitis or Crohn disease.  Being overweight.  Smoking.  Being inactive.  Drinking too much alcohol. SYMPTOMS  Most small polyps do not cause symptoms. If symptoms are present, they may include:  Blood in the stool. The stool may look dark red or black.  Constipation or diarrhea that lasts longer than 1 week. DIAGNOSIS People often do not know they have polyps until their caregiver finds them during a regular checkup. Your caregiver can use 4 tests to check for polyps:  Digital rectal exam. The caregiver wears gloves and feels inside the rectum. This test would find polyps only in the rectum.  Barium enema. The caregiver puts a liquid called barium into your rectum before taking X-rays of your colon. Barium makes your colon look white. Polyps are dark, so they are easy to see in the X-ray pictures.  Sigmoidoscopy. A thin, flexible tube (sigmoidoscope) is placed into your rectum. The sigmoidoscope has a light and tiny camera in it. The caregiver uses the sigmoidoscope to look at the last third of your colon.  Colonoscopy. This test is like sigmoidoscopy, but the caregiver looks at the entire colon. This is the most common method for finding and removing polyps. TREATMENT  Any polyps will be removed during a  sigmoidoscopy or colonoscopy. The polyps are then tested for cancer. PREVENTION  To help lower your risk of getting more colon polyps:  Eat plenty of fruits and vegetables. Avoid eating fatty foods.  Do not smoke.  Avoid drinking alcohol.  Exercise every day.  Lose weight if recommended by your caregiver.  Eat plenty of calcium and folate. Foods that are rich in calcium include milk, cheese, and broccoli. Foods that are rich in folate include chickpeas, kidney beans, and spinach. HOME CARE INSTRUCTIONS Keep all follow-up appointments as directed by your caregiver. You may need periodic exams to check for polyps. SEEK MEDICAL CARE IF: You notice bleeding during a bowel movement. Document Released: 03/17/2004 Document Revised: 09/13/2011 Document Reviewed: 08/31/2011 [facility] Patient Information 2014 [place].  High-Fiber Diet Fiber is found in fruits, vegetables, and grains. A high-fiber diet encourages the addition of more whole grains, legumes, fruits, and vegetables in your diet. The recommended amount of fiber for adult males is 38 g per day. For adult females, it is 25 g per day. Pregnant and lactating women should get 28 g of fiber per day. If you have a digestive or bowel problem, ask your caregiver for advice before adding high-fiber foods to your diet. Eat a variety of high-fiber foods instead of only a select few type of foods.  PURPOSE  To increase stool bulk.  To make bowel movements more regular to prevent constipation.  To lower cholesterol.  To prevent overeating. WHEN IS THIS DIET USED?  It may be used if you have constipation  and hemorrhoids.  It may be used if you have uncomplicated diverticulosis (intestine condition) and irritable bowel syndrome.  It may be used if you need help with weight management.  It may be used if you want to add it to your diet as a protective measure against atherosclerosis, diabetes, and cancer. SOURCES OF  FIBER  Whole-grain breads and cereals.  Fruits, such as apples, oranges, bananas, berries, prunes, and pears.  Vegetables, such as green peas, carrots, sweet potatoes, beets, broccoli, cabbage, spinach, and artichokes.  Legumes, such split peas, soy, lentils.  Almonds. FIBER CONTENT IN FOODS Starches and Grains / Dietary Fiber (g)  Cheerios, 1 cup / 3 g  Corn Flakes cereal, 1 cup / 0.7 g  Rice crispy treat cereal, 1 cup / 0.3 g  Instant oatmeal (cooked),  cup / 2 g  Frosted wheat cereal, 1 cup / 5.1 g  Brown, long-grain rice (cooked), 1 cup / 3.5 g  White, long-grain rice (cooked), 1 cup / 0.6 g  Enriched macaroni (cooked), 1 cup / 2.5 g Legumes / Dietary Fiber (g)  Baked beans (canned, plain, or vegetarian),  cup / 5.2 g  Kidney beans (canned),  cup / 6.8 g  Pinto beans (cooked),  cup / 5.5 g Breads and Crackers / Dietary Fiber (g)  Plain or honey graham crackers, 2 squares / 0.7 g  Saltine crackers, 3 squares / 0.3 g  Plain, salted pretzels, 10 pieces / 1.8 g  Whole-wheat bread, 1 slice / 1.9 g  White bread, 1 slice / 0.7 g  Raisin bread, 1 slice / 1.2 g  Plain bagel, 3 oz / 2 g  Flour tortilla, 1 oz / 0.9 g  Corn tortilla, 1 small / 1.5 g  Hamburger or hotdog bun, 1 small / 0.9 g Fruits / Dietary Fiber (g)  Apple with skin, 1 medium / 4.4 g  Sweetened applesauce,  cup / 1.5 g  Banana,  medium / 1.5 g  Grapes, 10 grapes / 0.4 g  Orange, 1 small / 2.3 g  Raisin, 1.5 oz / 1.6 g  Melon, 1 cup / 1.4 g Vegetables / Dietary Fiber (g)  Green beans (canned),  cup / 1.3 g  Carrots (cooked),  cup / 2.3 g  Broccoli (cooked),  cup / 2.8 g  Peas (cooked),  cup / 4.4 g  Mashed potatoes,  cup / 1.6 g  Lettuce, 1 cup / 0.5 g  Corn (canned),  cup / 1.6 g  Tomato,  cup / 1.1 g Document Released: 06/21/2005 Document Revised: 12/21/2011 Document Reviewed: 09/23/2011 [facility] Patient Information 2014 [place], [place].  PATIENT  INSTRUCTIONS POST-ANESTHESIA  IMMEDIATELY FOLLOWING SURGERY:  Do not drive or operate machinery for the first twenty four hours after surgery.  Do not make any important decisions for twenty four hours after surgery or while taking narcotic pain medications or sedatives.  If you develop intractable nausea and vomiting or a severe headache please notify your doctor immediately.  FOLLOW-UP:  Please make an appointment with your surgeon as instructed. You do not need to follow up with anesthesia unless specifically instructed to do so.  WOUND CARE INSTRUCTIONS (if applicable):  Keep a dry clean dressing on the anesthesia/puncture wound site if there is drainage.  Once the wound has quit draining you may leave it open to air.  Generally you should leave the bandage intact for twenty four hours unless there is drainage.  If the epidural site drains for more than 36-48 hours  please call the anesthesia department.  QUESTIONS?:  Please feel free to call your physician or the hospital operator if you have any questions, and they will be happy to assist you.

## 2013-12-10 NOTE — H&P (Signed)
Subjective:   Patient ID: Brittany Roberts, female DOB: 08-Mar-1941, 73 y.o. MRN: [id number]  HPI Brittany Roberts is a 73 year old Afro-American female known remotely to Dr. Deatra Ina. Old records show that she did have a colonoscopy in 2004 with removal of a 2 mm cecal polyp this was hot biopsied and we did not have any past she also had EGD in 2003 which was negative.  Patient has history of hypertension, depression, osteoarthritis, adult onset diabetes mellitus, coronary artery disease, restless leg syndrome, discoid lupus, GERD, COPD, hypothyroidism, and has a tardive dyskinesia apparently from previous Reglan use.  She comes in today with complaints of right lower abdominal pain. She is a fair historian but says that she has been having discomfort in her right side for the past couple of months. She describes it as a burning and bloated sensation. She is also concerned about some did not see can feel in her abdominal wall. [place] her pain is worse postprandially and better if she doesn't eat. Her appetite has been fair her weight has been stable though she says she cannot taste well. She has chronic problems with constipation and says she usually goes 2-3 days between bowel movements. She has not been using any regular laxative. She denies any radiation of pain into her back and has not had any known injury.  Review of Systems  Constitutional: Negative.  HENT: Negative.  Eyes: Negative.  Respiratory: Negative.  Cardiovascular: Negative.  Gastrointestinal: Positive for abdominal pain and constipation.  Endocrine: Negative.  Genitourinary: Negative.  Musculoskeletal: Positive for arthralgias, gait problem and joint swelling.  Allergic/Immunologic: Negative.  Neurological: Negative.  Hematological: Negative.  Psychiatric/Behavioral: Negative.  Outpatient Prescriptions Prior to Visit   Medication  Sig  Dispense  Refill   .  amLODipine (NORVASC) 10 MG tablet  Take 1 tablet (10 mg total) by mouth daily.  90  tablet  3   .  aspirin 81 MG tablet  Take 1 tablet (81 mg total) by mouth daily.  30 tablet    .  atorvastatin (LIPITOR) 40 MG tablet  Take 1 tablet (40 mg total) by mouth daily.  90 tablet  3   .  B-D INS SYRINGE 0.5CC/30GX1/2" 30G X 1/2" 0.5 ML MISC  use as directed  100 each  PRN   .  escitalopram (LEXAPRO) 20 MG tablet  Take 1 tablet (20 mg total) by mouth daily.  90 tablet  3   .  fluticasone (FLOVENT HFA) 220 MCG/ACT inhaler  Inhale 1 puff into the lungs 2 (two) times daily. Use even when feeling well  3 Inhaler  3   .  furosemide (LASIX) 40 MG tablet  Take 1 tablet (40 mg total) by mouth daily.  90 tablet  3   .  gabapentin (NEURONTIN) 600 MG tablet  Take 1 tablet (600 mg total) by mouth 3 (three) times daily.  270 tablet  3   .  insulin NPH Human (HUMULIN N,NOVOLIN N) 100 UNIT/ML injection  Inject 0.2 mLs (20 Units total) into the skin 2 (two) times daily before a meal.  50 mL  3   .  isosorbide mononitrate (IMDUR) 60 MG 24 hr tablet  Take 1 tablet (60 mg total) by mouth daily.  90 tablet  3   .  levothyroxine (SYNTHROID, LEVOTHROID) 75 MCG tablet  Take 1 tablet (75 mcg total) by mouth daily.  90 tablet  3   .  metoprolol tartrate (LOPRESSOR) 25 MG tablet  Take 0.5 tablets (  12.5 mg total) by mouth 2 (two) times daily.  90 tablet  3   .  mometasone (NASONEX) 50 MCG/ACT nasal spray  Place 2 sprays into the nose daily.  51 g  3   .  nitroGLYCERIN (NITROSTAT) 0.4 MG SL tablet  Place 1 tablet (0.4 mg total) under the tongue every 5 (five) minutes as needed for chest pain.  20 tablet  12   .  Polyethyl Glycol-Propyl Glycol (SYSTANE OP)  Place 1 drop into both eyes 4 (four) times daily.     Marland Kitchen  spironolactone (ALDACTONE) 25 MG tablet  Take 1 tablet (25 mg total) by mouth 2 (two) times daily.  180 tablet  3   .  traMADol (ULTRAM) 50 MG tablet  Take 1 tablet (50 mg total) by mouth 4 (four) times daily.  120 tablet  5   .  traZODone (DESYREL) 50 MG tablet  Take 1 tablet (50 mg total) by mouth at  bedtime as needed for sleep.  90 tablet  3   .  albuterol (PROAIR HFA) 108 (90 BASE) MCG/ACT inhaler  Inhale 2 puffs into the lungs every 4 (four) hours as needed for wheezing. Use only when you are wheezing  3 Inhaler  3   .  glucosamine-chondroitin 500-400 MG tablet  Take 1 tablet by mouth 2 (two) times daily.     Marland Kitchen  OVER THE COUNTER MEDICATION  Take 1 tablet by mouth 2 (two) times daily. Flexamin     .  PRODIGY NO CODING BLOOD GLUC test strip  TEST once daily  50 each  11    No facility-administered medications prior to visit.    Allergies   Allergen  Reactions   .  Pregabalin      REACTION: swelling   .  Ropinirole Hydrochloride      REACTION: swelling   .  Penicillins  Swelling and Rash    Patient Active Problem List    Diagnosis  Date Noted   .  Abdominal pain, unspecified site  09/27/2013   .  Hoarseness or changing voice  09/27/2013   .  Dyskinesia, tardive  07/11/2013   .  Polymyalgia rheumatica  05/30/2013   .  TMJ syndrome  02/16/2013   .  Angina pectoris associated with type 2 diabetes mellitus  08/08/2012   .  CAD in native artery  08/08/2012   .  Asthma  05/30/2012   .  Insomnia  09/22/2011   .  Mobility poor  02/02/2011   .  CHRONIC KIDNEY DISEASE STAGE II (MILD)  05/13/2010   .  CHRONIC DIASTOLIC HEART FAILURE  [id number]   .  COPD (chronic obstructive pulmonary disease)  03/26/2010   .  OBESITY  02/27/2010   .  DIABETIC NEUROPATHY, TYPE 2  04/25/2009   .  OSTEOARTHRITIS  04/25/2009   .  ANEMIA NOS  03/11/2007   .  COGNITIVE IMPAIRMENT, MILD, SO STATED  03/11/2007   .  RESTLESS LEG SYNDROME  03/11/2007   .  LUPUS ERYTHEMATOSUS, DISCOID  03/11/2007   .  HYPOTHYROIDISM  03/07/2007   .  DYSLIPIDEMIA  03/07/2007   .  DEPRESSION  03/07/2007   .  HYPERTENSION  03/07/2007   .  CORONARY ARTERY DISEASE  03/07/2007   .  GERD  03/07/2007   .  SLEEP APNEA  03/07/2007    History   Substance Use Topics   .  Smoking status:  Former Smoker -- 3.00 packs/day for  20  years     Types:  Cigarettes     Quit date:  07/06/1999   .  Smokeless tobacco:  Never Used   .  Alcohol Use:  No   family history includes Asthma in her brother; Clotting disorder in her mother; Diabetes in her mother, sister, and sister; Heart disease in her mother; Pneumonia in her father; Rheum arthritis in her father.  Objective:   Physical Exam well-developed female in no acute distress, she has a tardive dyskinesia with continuous mouth movements blood pressure 126/88 pulse 72 height 5 foot 3 weight 288, BMI 51. HEENT; nontraumatic normocephalic EOMI PERRLA sclera anicteric continuous facial and mouth movements, Supple; no JVD, Cardiovascular ;regular rate and rhythm with S1-S2 no murmur or gallop, Pulmonary ;clear bilaterally, Abdomen; obese soft she is tender in the right mid quadrant right lower quadrant, she has a cholecystectomy scar and a low midline incisional scar no palpable mass or hepatosplenomegaly no guarding or rebound, she has some small subcutaneous lesions which are likely scarring from previous insulin injections, Rectal; not done, Extremities; decreased mobility no clubbing cyanosis or edema, Psych; mood appropriate, affect odd.  Assessment & Plan:   #61 73 year old female status post cholecystectomy with complaint of 2 month history of right lower quadrant abdominal pain in the setting of chronic constipation. Etiology is not clear rule out secondary to obstipation rule out occult lesion  #2 status post hysterectomy  #3 adult-onset diabetes mellitus with diabetic neuropathy  #4 hypertension  #5 coronary artery disease  #6 COPD  #7 chronic GERD  #8 discoid lupus  #9 osteoarthritis  #10 tardive dyskinesia  #11 remote history of colon polyps last colonoscopy 2004   CT scan is negative for colonic lesions  Impression-chronic constipation-rule out structural abnormality  Recommendations #1 colonoscopy

## 2013-12-10 NOTE — Transfer of Care (Signed)
Immediate Anesthesia Transfer of Care Note  Patient: Brittany Roberts  Procedure(s) Performed: Procedure(s): COLONOSCOPY (N/A)  Patient Location: PACU  Anesthesia Type:MAC  Level of Consciousness: awake, alert  and patient cooperative  Airway & Oxygen Therapy: Patient Spontanous Breathing and Patient connected to face mask oxygen  Post-op Assessment: Report given to PACU RN and Post -op Vital signs reviewed and stable  Post vital signs: Reviewed and stable  Complications: No apparent anesthesia complications

## 2013-12-10 NOTE — Op Note (Signed)
[facility] [place], [address]   COLONOSCOPY PROCEDURE REPORT  PATIENT: Brittany Roberts, Brittany Roberts  MR#: [id number] BIRTHDATE: December 06, 1940 , 73  yrs. old GENDER: Female ENDOSCOPIST: Inda Castle, MD REFERRED [id number] [name], M.D. PROCEDURE DATE:  12/10/2013 PROCEDURE:   Colonoscopy with snare polypectomy ASA CLASS:   Class III INDICATIONS:Change in bowel habits. MEDICATIONS: MAC sedation, administered by CRNA  DESCRIPTION OF PROCEDURE:   After the risks benefits and alternatives of the procedure were thoroughly explained, informed consent was obtained.  A digital rectal exam revealed no abnormalities of the rectum.   The Pentax Colonoscope [id number] endoscope was introduced through the anus and advanced to the cecum, which was identified by both the appendix and ileocecal valve. No adverse events experienced.   The quality of the prep was good, using MiraLax  The instrument was then slowly withdrawn as the colon was fully examined.      COLON FINDINGS: A sessile polyp measuring 3 mm in size was found in the distal transverse colon.  A polypectomy was performed.  The resection was complete and the polyp tissue was completely retrieved.   The colon was otherwise normal.  There was no diverticulosis, inflammation, polyps or cancers unless previously stated.  Retroflexed views revealed no abnormalities. The time to cecum=  .  Withdrawal time=8 minutes 0 seconds.  The scope was withdrawn and the procedure completed. COMPLICATIONS: There were no complications.  ENDOSCOPIC IMPRESSION: 1.   Sessile polyp measuring 3 mm in size was found in the distal transverse colon; polypectomy was performed 2.   The colon was otherwise normal  RECOMMENDATIONS: 1.  If the polyp(s) removed today are proven to be adenomatous (pre-cancerous) polyps, you will need a repeat colonoscopy in 5 years.  Otherwise you should continue to follow colorectal cancer screening  guidelines for "routine risk" patients with colonoscopy in 10 years.  You will receive a letter within 1-2 weeks with the results of your biopsy as well as final recommendations.  Please call my office if you have not received a letter after 3 weeks. 2.  High fiber diet 3.  office visit 2 months   eSigned:  Inda Castle, MD 12/10/2013 11:13 AM   cc:   PATIENT NAME:  Brittany Roberts, Brittany Roberts MR#: [id number]

## 2013-12-10 NOTE — Telephone Encounter (Signed)
Called and discussed.  Colonoscopy today went well - only one small polyp. She asked about the difference between prilosec and protonix.  For now she will stay on the protonix Asked if she needed to see kidney doctor because of CT finding.  Reviewed CT and assured her these were common, benign findings.

## 2013-12-10 NOTE — Anesthesia Preprocedure Evaluation (Addendum)
Anesthesia Evaluation  Patient identified by MRN, date of birth, ID band Patient awake    Reviewed: Allergy & Precautions, H&P , NPO status , Patient's Chart, lab work & pertinent test results, reviewed documented beta blocker date and time   Airway Mallampati: III TM Distance: >3 FB Neck ROM: full    Dental  (+) Edentulous Upper, Edentulous Lower, Dental Advisory Given   Pulmonary asthma , sleep apnea , COPDformer smoker,  breath sounds clear to auscultation  Pulmonary exam normal       Cardiovascular Exercise Tolerance: Poor hypertension, Pt. on medications and Pt. on home beta blockers + angina + CAD and +CHF Rhythm:regular Rate:Normal  Chronic diastolic heart failure   Neuro/Psych Depression Tardive dyskinesia negative neurological ROS  negative psych ROS   GI/Hepatic negative GI ROS, Neg liver ROS, GERD-  Controlled and Medicated,  Endo/Other  diabetes, Well Controlled, Insulin DependentHypothyroidism Morbid obesity  Renal/GU Renal diseaseStage 2 chronic kidney disease  negative genitourinary   Musculoskeletal   Abdominal (+) + obese,   Peds  Hematology negative hematology ROS (+)   Anesthesia Other Findings Lupus  Reproductive/Obstetrics negative OB ROS                       Anesthesia Physical Anesthesia Plan  ASA: IV  Anesthesia Plan: MAC   Post-op Pain Management:    Induction:   Airway Management Planned: Simple Face Mask  Additional Equipment:   Intra-op Plan:   Post-operative Plan:   Informed Consent: I have reviewed the patients History and Physical, chart, labs and discussed the procedure including the risks, benefits and alternatives for the proposed anesthesia with the patient or authorized representative who has indicated his/her understanding and acceptance.   Dental Advisory Given  Plan Discussed with: CRNA and Surgeon  Anesthesia Plan Comments:         Anesthesia Quick Evaluation

## 2013-12-11 ENCOUNTER — Encounter: Payer: Self-pay | Admitting: Gastroenterology

## 2013-12-11 ENCOUNTER — Encounter (HOSPITAL_COMMUNITY): Payer: Self-pay | Admitting: Gastroenterology

## 2013-12-14 ENCOUNTER — Telehealth: Payer: Self-pay | Admitting: Neurology

## 2013-12-14 NOTE — Telephone Encounter (Signed)
States she was to call the Dr. Parks Ranger she is no longer having a reaction to the medication.

## 2013-12-14 NOTE — Telephone Encounter (Signed)
Spoke with patient, she states that the Amlodipine was causing "brown spots" on her which they are no longer occuring.

## 2013-12-25 NOTE — Telephone Encounter (Signed)
Spoke to patient and she is now aware that Dr. Janann Colonel was out of the office yesterday and he is out today. I told the patient that he will be back tomorrow and will review his messages at that time. Patient verbalizes understanding.

## 2013-12-25 NOTE — Telephone Encounter (Signed)
Patient calling to state that she never heard back from Dr. Janann Colonel regarding these brown spots, please return call to patient and advise.

## 2013-12-26 ENCOUNTER — Other Ambulatory Visit: Payer: Self-pay | Admitting: Neurology

## 2013-12-26 MED ORDER — TETRABENAZINE 12.5 MG PO TABS: 12.5000 mg | ORAL_TABLET | Freq: Every day | ORAL | Status: DC

## 2013-12-26 NOTE — Telephone Encounter (Signed)
Returned call. Counseled patient that there are unfortunately not many available options to treat tardive dyskinesia. Will start trial of tetrabenazine 12.5mg  daily. Counseled patient on potential side effects. She reports depression is controlled, no SI. Will monitor. Prescription sent to pharmacy.

## 2013-12-26 NOTE — Telephone Encounter (Signed)
Called to setup appointment for Brittany Roberts. No voicemail to leave message.

## 2014-01-31 ENCOUNTER — Telehealth: Payer: Self-pay | Admitting: Neurology

## 2014-01-31 NOTE — Telephone Encounter (Signed)
Breon from [facility] calling to state that the treatment form they received regarding patient is incomplete, they need an actual legal signature instead of a stamped one from the doctor. Please return call and advise.

## 2014-01-31 NOTE — Telephone Encounter (Signed)
I called back.  Spoke with Demetra.  He said due to a change, they just need an updated form sent, and as FYI they do not allow signature stamps on the form.  Advised him a new form would be sent once it had been signed.  He will note the patient's account.

## 2014-02-04 ENCOUNTER — Telehealth: Payer: Self-pay | Admitting: Family Medicine

## 2014-02-04 DIAGNOSIS — E1140 Type 2 diabetes mellitus with diabetic neuropathy, unspecified: Secondary | ICD-10-CM

## 2014-02-04 NOTE — Telephone Encounter (Signed)
Please advise.Thank you.Won Kreuzer S  

## 2014-02-04 NOTE — Telephone Encounter (Signed)
Patient has made an appt at [facility] for 8/12 at 10:30 am for DM foot care. Will need a referral placed and sent before her appt next week.

## 2014-02-05 NOTE — Telephone Encounter (Signed)
Relayed message,patient voiced understanding. Brittany Roberts  

## 2014-02-05 NOTE — Telephone Encounter (Signed)
Pt was calling to check the status of her referral to the [facility]. Please call and update her. jw

## 2014-02-07 ENCOUNTER — Telehealth: Payer: Self-pay

## 2014-02-07 NOTE — Telephone Encounter (Signed)
[facility] notified us they have approved our request for coverage on Xenazine effective until 05/10/2014 Ref #  [id number]

## 2014-02-12 ENCOUNTER — Encounter: Payer: Self-pay | Admitting: Family Medicine

## 2014-02-20 ENCOUNTER — Encounter: Payer: Self-pay | Admitting: Gastroenterology

## 2014-02-20 ENCOUNTER — Ambulatory Visit (INDEPENDENT_AMBULATORY_CARE_PROVIDER_SITE_OTHER): Payer: PRIVATE HEALTH INSURANCE | Admitting: Gastroenterology

## 2014-02-20 VITALS — BP 132/72 | HR 68 | Ht 63.0 in | Wt 286.6 lb

## 2014-02-20 DIAGNOSIS — K5900 Constipation, unspecified: Secondary | ICD-10-CM

## 2014-02-20 DIAGNOSIS — D126 Benign neoplasm of colon, unspecified: Secondary | ICD-10-CM

## 2014-02-20 DIAGNOSIS — R1031 Right lower quadrant pain: Secondary | ICD-10-CM

## 2014-02-20 NOTE — Patient Instructions (Signed)
Research will be speaking with you today 

## 2014-02-20 NOTE — Assessment & Plan Note (Signed)
Plan followup colonoscopy 5 years 

## 2014-02-20 NOTE — Progress Notes (Signed)
      History of Present Illness:  [name] has returned for followup of abdominal pain and constipation.  CT scan was unrevealing.  A small adenomatous polyp was removed at colonoscopy.  Abdominal pain has subsided but she still complains of constipation despite taking MiraLax and fiber.  She may have a bowel movement every 6-7 days.  She also complains of pyrosis and some bloating.  She takes Protonix    Review of Systems: Pertinent positive and negative review of systems were noted in the above HPI section. All other review of systems were otherwise negative.    Current Medications, Allergies, Past Medical History, Past Surgical History, Family History and Social History were reviewed in [facility] record  Vital signs were reviewed in today's medical record. Physical Exam: General: Obese female in no acute distress   See Assessment and Plan under Problem List

## 2014-02-20 NOTE — Assessment & Plan Note (Signed)
Clinically resolved.  CT scan was unremarkable as was colonoscopy, for source of pain.

## 2014-02-20 NOTE — Assessment & Plan Note (Signed)
Patient suffers from chronic idiopathic constipation.  She'll be referred to GI research for consideration of a CIC trial.  Failing this, I would consider Linzess

## 2014-03-01 ENCOUNTER — Encounter: Payer: Self-pay | Admitting: Family Medicine

## 2014-03-01 ENCOUNTER — Ambulatory Visit (INDEPENDENT_AMBULATORY_CARE_PROVIDER_SITE_OTHER): Payer: PRIVATE HEALTH INSURANCE | Admitting: Family Medicine

## 2014-03-01 VITALS — BP 91/53 | HR 62 | Temp 99.1°F | Ht 62.0 in | Wt 290.0 lb

## 2014-03-01 DIAGNOSIS — N179 Acute kidney failure, unspecified: Secondary | ICD-10-CM

## 2014-03-01 DIAGNOSIS — Z794 Long term (current) use of insulin: Secondary | ICD-10-CM

## 2014-03-01 DIAGNOSIS — E1149 Type 2 diabetes mellitus with other diabetic neurological complication: Secondary | ICD-10-CM

## 2014-03-01 DIAGNOSIS — I10 Essential (primary) hypertension: Secondary | ICD-10-CM

## 2014-03-01 DIAGNOSIS — E119 Type 2 diabetes mellitus without complications: Secondary | ICD-10-CM

## 2014-03-01 LAB — BASIC METABOLIC PANEL
BUN: 44 mg/dL — ABNORMAL HIGH (ref 6–23)
CO2: 24 mEq/L (ref 19–32)
Calcium: 8.8 mg/dL (ref 8.4–10.5)
Chloride: 106 mEq/L (ref 96–112)
Creat: 2.26 mg/dL — ABNORMAL HIGH (ref 0.50–1.10)
Glucose, Bld: 145 mg/dL — ABNORMAL HIGH (ref 70–99)
Potassium: 5.1 mEq/L (ref 3.5–5.3)
Sodium: 137 mEq/L (ref 135–145)

## 2014-03-01 LAB — POCT GLYCOSYLATED HEMOGLOBIN (HGB A1C): Hemoglobin A1C: 6.3

## 2014-03-01 NOTE — Assessment & Plan Note (Signed)
I am concerned with lowish blood pressure today that much of her weakness is overtreatment of hypertension.  Will stop norvasc and see how her symptoms do.  May need further work up.

## 2014-03-01 NOTE — Patient Instructions (Signed)
Stop your norvasc/amlodipine.  I hope that gives you more energy and strength. See me in 3-4 weeks.  I want to check your blood pressure to see how you are feeling.

## 2014-03-01 NOTE — Progress Notes (Signed)
   Subjective:    Patient ID: Brittany Roberts, female    DOB: [birth year], 73 y.o.   MRN: [id number]  HPI  Multiple complaints. Has been seen by cards and metoprolol and ACE recently added per patient.  Med list is correct.  No chest pain. Weak in both arms/shoulders and in thighs.  She must stop to rest after ~ 50 yards due to weakness.      Review of Systems     Objective:   Physical ExamLungs clear Cardiac RRR without m or g         Assessment & Plan:

## 2014-03-01 NOTE — Assessment & Plan Note (Signed)
Good control

## 2014-03-03 ENCOUNTER — Other Ambulatory Visit: Payer: Self-pay | Admitting: Family Medicine

## 2014-03-04 ENCOUNTER — Telehealth: Payer: Self-pay | Admitting: Neurology

## 2014-03-04 DIAGNOSIS — N179 Acute kidney failure, unspecified: Secondary | ICD-10-CM | POA: Insufficient documentation

## 2014-03-04 NOTE — Telephone Encounter (Signed)
Brittany Roberts is now available in generic.  Would you like the patient to remain or brand name, or is generic substitution permissible?  Please advise.  Thank you.

## 2014-03-04 NOTE — Telephone Encounter (Signed)
Generic is fine. Thanks

## 2014-03-04 NOTE — Telephone Encounter (Signed)
Brittany Roberts foundation calling to state that there is now a generic available of Brittany Roberts and they need a new script written stating whether or not the brand name is necessary or if it is okay to have generic. Please return call and advise.

## 2014-03-04 NOTE — Telephone Encounter (Signed)
I called back.  Recording said the office was closed.  I will call back tomorrow.

## 2014-03-04 NOTE — Addendum Note (Signed)
Addended by: Zenia Resides on: 03/04/2014 04:31 PM   Modules accepted: Orders

## 2014-03-04 NOTE — Assessment & Plan Note (Signed)
Labs indicate acute kidney injury - why?  Likely due to the hypotension and the starting of ACE (less likely the starting of metoprolol.)  Called and informed.  Will have her in for a nurse visit to recheck her blood pressure and get BMP (future lab ordered.)

## 2014-03-05 ENCOUNTER — Telehealth: Payer: Self-pay | Admitting: *Deleted

## 2014-03-05 NOTE — Telephone Encounter (Signed)
Made an appointment for patient to come in Friday 9/4 @11am  for a BP recheck and Lab work

## 2014-03-05 NOTE — Telephone Encounter (Signed)
I called again.  Spoke with Theodis Sato.  Relayed Dr Hazle Quant message.  She will notate the file.

## 2014-03-08 ENCOUNTER — Ambulatory Visit (INDEPENDENT_AMBULATORY_CARE_PROVIDER_SITE_OTHER): Payer: PRIVATE HEALTH INSURANCE | Admitting: *Deleted

## 2014-03-08 VITALS — BP 110/78 | HR 66

## 2014-03-08 DIAGNOSIS — Z136 Encounter for screening for cardiovascular disorders: Secondary | ICD-10-CM

## 2014-03-08 DIAGNOSIS — I10 Essential (primary) hypertension: Secondary | ICD-10-CM

## 2014-03-08 DIAGNOSIS — Z0130 Encounter for examination of blood pressure without abnormal findings: Secondary | ICD-10-CM

## 2014-03-08 DIAGNOSIS — N179 Acute kidney failure, unspecified: Secondary | ICD-10-CM

## 2014-03-08 NOTE — Progress Notes (Signed)
  Pt in nurse clinic for blood pressure check.  Blood pressure 110/78 manually, HR 66. Pt denies any other symptoms today.  Will forward to PCP. Derl Barrow, RN

## 2014-03-09 LAB — BASIC METABOLIC PANEL
BUN: 43 mg/dL — ABNORMAL HIGH (ref 6–23)
CO2: 22 mEq/L (ref 19–32)
Calcium: 8.7 mg/dL (ref 8.4–10.5)
Chloride: 101 mEq/L (ref 96–112)
Creat: 2.91 mg/dL — ABNORMAL HIGH (ref 0.50–1.10)
Glucose, Bld: 202 mg/dL — ABNORMAL HIGH (ref 70–99)
Potassium: 4.9 mEq/L (ref 3.5–5.3)
Sodium: 133 mEq/L — ABNORMAL LOW (ref 135–145)

## 2014-03-12 ENCOUNTER — Encounter: Payer: Self-pay | Admitting: Neurology

## 2014-03-12 ENCOUNTER — Ambulatory Visit (INDEPENDENT_AMBULATORY_CARE_PROVIDER_SITE_OTHER): Payer: Medicare Other | Admitting: Neurology

## 2014-03-12 VITALS — BP 120/52 | HR 64 | Ht 64.0 in | Wt 286.0 lb

## 2014-03-12 DIAGNOSIS — R278 Other lack of coordination: Secondary | ICD-10-CM

## 2014-03-12 DIAGNOSIS — R279 Unspecified lack of coordination: Secondary | ICD-10-CM

## 2014-03-12 MED ORDER — GABAPENTIN 300 MG PO CAPS: 300.0000 mg | ORAL_CAPSULE | Freq: Two times a day (BID) | ORAL | Status: DC

## 2014-03-12 NOTE — Assessment & Plan Note (Signed)
Given that her creat is still up, will DC lisinopril and recheck BMP.

## 2014-03-12 NOTE — Progress Notes (Signed)
Patient ID: Brittany Roberts, female   DOB: 05-May-1941, 73 y.o.   MRN: [id number] Patient's creat continues to rise.  As best I can tell, the rise began with start of lisinopril/ACE.  Called patient and told to stop lisinopril and to have another blood test on Friday.

## 2014-03-12 NOTE — Progress Notes (Signed)
[facility]    Provider:  Dr Janann Colonel Referring Provider: Zigmund Gottron,* Primary Care Physician:  Zigmund Gottron, MD  CC:  Abnormal involuntary movements  HPI:  INDEA HARBOR is a 73 y.o. female here as a follow up from Dr. [name] for abnormal involuntary movements of her face. Last visit was 11/2013 at which time she tried on amantadine for her tardive dyskinesia. She developed a rash and discontinued the medication. She was given a prescription for [place] but did not start it because she reports multiple new concerns. She notes new concern over intermittent generalized twitching of her muscles, described as brief myoclonic jerks. She notes feeling unsteady on her feet, feels off balance. No falls.   She takes gabapentin 600mg  TID. Most recent BMP showed a BUN of 44 and a Cr of 2.26.   Initial visit 11/2013 Reports the symptoms began around 1 year ago. At that time she had been taking reglan daily for a few years prior to symptoms. Was taking for severe GERD and stopped once she developed symptoms. Her symptoms improved slightly since stopping reglan but has not resolved completely. She notes occasional episodes of difficulty swallowing and talking. She has noted a change in her speech, feels it is more slurred and has become more high pitched. Is harder to talk. Notes symptoms are just peri-oral, does not involve the periorbital region. Has not tried any medication for these symptoms.   She was recently evaluated by ENT for change in voice. They suspect symptoms likely related to history of GERD and also possibly related to TD.   She notes history of fibromyalgia, SLE and arthritis.   Review of Systems: Out of a complete 14 system review, the patient complains of only the following symptoms, and all other reviewed systems are negative. + weight gain, blurred vision, easy bruising, memory loss, confusion, slurred speech, snoring, restless legs  History    Social History  . Marital Status: Single    Spouse Name: N/A    Number of Children: 4  . Years of Education: N/A   Occupational History  . RetiredMedia planner    Social History Main Topics  . Smoking status: Former Smoker -- 3.00 packs/day for 20 years    Types: Cigarettes    Quit date: 07/06/1999  . Smokeless tobacco: Never Used  . Alcohol Use: No  . Drug Use: No  . Sexual Activity: Not Currently   Other Topics Concern  . Not on file   Social History Narrative   Health Care POA:    Emergency Contact: daughter, Lorenso Courier [phone] or [name] [phone]   End of Life Plan: Does not want to be ventilated or feeding tubes.    Who lives with you: Lives with daughter Lorenso Courier   Any pets: none   Diet: Patient reports enjoying and eating junk food.  Does not regulate types of food and currently her dentures are broken so it is hard to eat some foods.   Exercise: Patient does not have an exercise plan.   Seatbelts: Patient reports wearing seatbelt when in vehicle.   Sun Exposure/Protection: Patient reports not going outside very often.   Hobbies: Patient enjoys reading the bible and watching game shows.           Family History  Problem Relation Age of Onset  . Heart disease Mother   . Diabetes Mother   . Clotting disorder Mother   . Pneumonia Father   . Rheum arthritis Father   .  Diabetes Sister   . Diabetes Sister   . Asthma Brother   . Colon cancer Neg Hx     Past Medical History  Diagnosis Date  . Depression   . Lupus   . Asthma   . Diabetes mellitus   . Hypertension   . Hypothyroidism   . Congestive heart failure, unspecified   . Other and unspecified hyperlipidemia   . Unspecified vitamin D deficiency   . Renal failure, unspecified   . History of oxygen administration     oxygen use 2l/m nasally during the night  . Cataracts, both eyes   . OSA (obstructive sleep apnea)     no cpap used    Past Surgical History  Procedure Laterality Date   . Tubal ligation    . Nasal sinus surgery    . Gallbladder surgery    . Carpal tunnel release Bilateral   . Ankle arthroscopy with open reduction internal fixation (orif) Right     2-3 yrs ago-retained screws  . Cholecystectomy    . Colonoscopy N/A 12/10/2013    Procedure: COLONOSCOPY;  Surgeon: Inda Castle, MD;  Location: [facility];  Service: Endoscopy;  Laterality: N/A;    Current Outpatient Prescriptions  Medication Sig Dispense Refill  . aspirin EC 81 MG tablet Take 81 mg by mouth daily.      Marland Kitchen atorvastatin (LIPITOR) 20 MG tablet Take 1 tablet (20 mg total) by mouth daily.  90 tablet  3  . b complex vitamins capsule Take 1 capsule by mouth daily.      . B-D INS SYRINGE 0.5CC/30GX1/2" 30G X 1/2" 0.5 ML MISC use as directed  100 each  PRN  . Biotin 5000 MCG TABS Take 5,000 mcg by mouth daily.      . cholecalciferol (VITAMIN D) 1000 UNITS tablet Take 1,000 Units by mouth daily.      . furosemide (LASIX) 40 MG tablet Take 1 tablet (40 mg total) by mouth daily.  90 tablet  3  . gabapentin (NEURONTIN) 600 MG tablet       . Glucosamine-Chondroit-Vit C-Mn (GLUCOSAMINE CHONDR 1500 COMPLX PO) Take 1 tablet by mouth 2 (two) times daily.      Marland Kitchen glucose blood test strip Check blood sugar three times per day.  300 each  10  . insulin NPH Human (HUMULIN N,NOVOLIN N) 100 UNIT/ML injection Inject 30 Units into the skin every morning.      . isosorbide mononitrate (IMDUR) 60 MG 24 hr tablet Take 60 mg by mouth every morning.      Marland Kitchen levothyroxine (SYNTHROID, LEVOTHROID) 50 MCG tablet Take 50 mcg by mouth daily before breakfast.      . lisinopril (PRINIVIL,ZESTRIL) 10 MG tablet       . metoprolol succinate (TOPROL-XL) 25 MG 24 hr tablet Take 12.5 mg by mouth daily.      . mometasone (NASONEX) 50 MCG/ACT nasal spray Place 2 sprays into the nose daily.  51 g  3  . nitroGLYCERIN (NITROSTAT) 0.4 MG SL tablet Place 1 tablet (0.4 mg total) under the tongue every 5 (five) minutes as needed for chest  pain.  20 tablet  12  . pantoprazole (PROTONIX) 40 MG tablet Take 1 tablet (40 mg total) by mouth 2 (two) times daily.  180 tablet  12  . Polyethyl Glycol-Propyl Glycol (SYSTANE OP) Place 1 drop into both eyes 4 (four) times daily.      . polyethylene glycol powder (GLYCOLAX/MIRALAX) powder Take 17 grams in  8 0Z of water or juice daily.  255 g  2  . spironolactone (ALDACTONE) 25 MG tablet Take 1 tablet (25 mg total) by mouth 2 (two) times daily.  180 tablet  3  . tetrabenazine (XENAZINE) 12.5 MG tablet Take 1 tablet (12.5 mg total) by mouth daily.  30 tablet  3  . traMADol (ULTRAM) 50 MG tablet TAKE 1 TABLET BY MOUTH 3 TIMES DAILY  90 tablet  5  . traZODone (DESYREL) 50 MG tablet Take 1 tablet (50 mg total) by mouth at bedtime as needed for sleep.  90 tablet  3  . VITAMIN A PO Take 1 tablet by mouth daily.       No current facility-administered medications for this visit.    Allergies as of 03/12/2014 - Review Complete 03/08/2014  Allergen Reaction Noted  . Ace inhibitors  03/12/2014  . Pregabalin    . Ropinirole hydrochloride  03/07/2007  . Amantadines Rash 11/30/2013  . Penicillins Swelling and Rash 03/07/2007    Vitals: BP 120/52  Pulse 64  Ht 5\' 4"  (1.626 m)  Wt 286 lb (129.729 kg)  BMI 49.07 kg/m2 Last Weight:  Wt Readings from Last 1 Encounters:  03/12/14 286 lb (129.729 kg)   Last Height:   Ht Readings from Last 1 Encounters:  03/12/14 5\' 4"  (1.626 m)     Physical exam: Exam: Gen: NAD, conversant Eyes: anicteric sclerae, moist conjunctivae HENT: Atraumatic, oropharynx clear Neck: Trachea midline; supple,  Lungs: CTA, no wheezing, rales, rhonic                          CV: RRR, no MRG Abdomen: Soft, non-tender;  Extremities: No peripheral edema  Skin: Normal temperature, no rash,  Psych: Appropriate affect, pleasant  Neuro: MS: AA&Ox3, appropriately interactive, normal affect   Attention: WORLD backwards  Speech: fluent w/o paraphasic error  Memory:  good recent and remote recall  CN: Noted continuous dyskinesias of perioral region with intermittent frequent tongue extension  PERRL, EOMI no nystagmus, no ptosis, sensation intact to LT V1-V3 bilat, face symmetric, no weakness, hearing grossly intact, palate elevates symmetrically, shoulder shrug 5/5 bilat,  tongue protrudes midline, no fasiculations noted.  Motor: normal bulk and tone Strength: 5/5  In all extremities Brief intermittent myoclonic jerks of extremities. With extension of bilateral UE noted marked asterixis  Reflexes: diminished, absent achilles bilaterally, bilat downgoing toes  Sens: LT intact in all extremities, patient notes severe tenderness/pain to LT  Gait: she defers walking at this time due to pain   Assessment:  After physical and neurologic examination, review of laboratory studies, imaging, neurophysiology testing and pre-existing records, assessment will be reviewed on the problem list.  Plan:  Treatment plan and additional workup will be reviewed under Problem List.  1)Tardive dyskinesia 2)Dysarthria 3)Asterixis  73y/o woman presenting for follow up evaluation of abnormal involuntary movements related to prolonged use of reglan. Based on history and exam, these movements are most consistent with a diagnosis of tardive dyskinesia. Initially tried patient on amantadine but she was unable to tolerate. Discussed trying Delcie Roch but patient held off because she developed new neurological symptoms. Presents today with main concern being generalized brief myoclonic jerks and gait instability. Based on exam, history and lab work (Cr 2.26) these new movements are most consistent with a diagnosis of asterixis. Suspect this is likely related to impaired renal function and use of high dose gabapentin. Discussed this with patient who expressed understanding. Will  check blood work today, decrease gabapentin to 300mg  BID. Follow up in 3 months with Dr Krista Blue.   Jim Like, DO  [facility] [address] [place], [place] [address]  Phone [phone] Fax [phone]

## 2014-03-12 NOTE — Addendum Note (Signed)
Addended by: Madison Hickman A on: 03/12/2014 11:00 AM   Modules accepted: Orders, Medications

## 2014-03-12 NOTE — Patient Instructions (Signed)
Overall you are doing fairly well but I do want to suggest a few things today:   Remember to drink plenty of fluid, eat healthy meals and do not skip any meals. Try to eat protein with a every meal and eat a healthy snack such as fruit or nuts in between meals. Try to keep a regular sleep-wake schedule and try to exercise daily, particularly in the form of walking, 20-30 minutes a day, if you can.   As far as your medications are concerned, I would like to suggest you decrease the gabapentin to 300mg  twice a day. A new prescription was sent to your pharmacy.   Please have some blood work completed today.   Please follow up in 3 months with Dr Krista Blue. Please call us with any interim questions, concerns, problems, updates or refill requests.   My clinical assistant and will answer any of your questions and relay your messages to me and also relay most of my messages to you.   Our phone number is [phone]. We also have an after hours call service for urgent matters and there is a physician on-call for urgent questions. For any emergencies you know to call 911 or go to the nearest emergency room

## 2014-03-13 ENCOUNTER — Telehealth: Payer: Self-pay | Admitting: Neurology

## 2014-03-13 LAB — COMPREHENSIVE METABOLIC PANEL
ALT: 16 IU/L (ref 0–32)
AST: 25 IU/L (ref 0–40)
Albumin/Globulin Ratio: 1 — ABNORMAL LOW (ref 1.1–2.5)
Albumin: 3.8 g/dL (ref 3.5–4.8)
Alkaline Phosphatase: 119 IU/L — ABNORMAL HIGH (ref 39–117)
BUN/Creatinine Ratio: 16 (ref 11–26)
BUN: 65 mg/dL — ABNORMAL HIGH (ref 8–27)
CO2: 18 mmol/L (ref 18–29)
Calcium: 8.8 mg/dL (ref 8.7–10.3)
Chloride: 102 mmol/L (ref 97–108)
Creatinine, Ser: 3.94 mg/dL — ABNORMAL HIGH (ref 0.57–1.00)
GFR calc Af Amer: 12 mL/min/{1.73_m2} — ABNORMAL LOW (ref >59)
GFR calc non Af Amer: 11 mL/min/{1.73_m2} — ABNORMAL LOW (ref >59)
Globulin, Total: 3.9 g/dL (ref 1.5–4.5)
Glucose: 67 mg/dL (ref 65–99)
Potassium: 6.1 mmol/L — ABNORMAL HIGH (ref 3.5–5.2)
Sodium: 137 mmol/L (ref 134–144)
Total Bilirubin: 0.3 mg/dL (ref 0.0–1.2)
Total Protein: 7.7 g/dL (ref 6.0–8.5)

## 2014-03-13 LAB — TSH: TSH: 0.613 u[IU]/mL (ref 0.450–4.500)

## 2014-03-13 LAB — AMMONIA: Ammonia: 90 ug/dL — ABNORMAL HIGH (ref 19–87)

## 2014-03-13 NOTE — Telephone Encounter (Signed)
Received patients most recent lab results. Pertinent for a BUN of 65, Cr 3.94 and K of 6.1, all of which are trending up from BMP 5 days prior. Called patients PCP, Dr. [name], to discuss results and further workup. He has been following patient for steadily worsening renal function and reports he will call her today to follow up on findings.

## 2014-03-15 ENCOUNTER — Other Ambulatory Visit: Payer: Medicaid Other

## 2014-03-15 DIAGNOSIS — I10 Essential (primary) hypertension: Secondary | ICD-10-CM

## 2014-03-15 LAB — BASIC METABOLIC PANEL
BUN: 66 mg/dL — ABNORMAL HIGH (ref 6–23)
CO2: 18 mEq/L — ABNORMAL LOW (ref 19–32)
Calcium: 9 mg/dL (ref 8.4–10.5)
Chloride: 105 mEq/L (ref 96–112)
Creat: 2.72 mg/dL — ABNORMAL HIGH (ref 0.50–1.10)
Glucose, Bld: 150 mg/dL — ABNORMAL HIGH (ref 70–99)
Potassium: 6.1 mEq/L — ABNORMAL HIGH (ref 3.5–5.3)
Sodium: 138 mEq/L (ref 135–145)

## 2014-03-15 NOTE — Progress Notes (Signed)
STAT BMP DONE TODAY Brittany Roberts

## 2014-03-18 NOTE — Progress Notes (Signed)
Patient ID: Brittany Roberts, female   DOB: 1941/02/23, 73 y.o.   MRN: [id number] Creat improved and BP OK.  Has an appointment to see me in 11 days.  Will have nurse visit to check BP and recheck BMP on 9/18

## 2014-03-18 NOTE — Progress Notes (Signed)
Patient ID: Brittany Roberts, female   DOB: 09-09-1940, 73 y.o.   MRN: [id number] [name], please call and schedule

## 2014-03-29 ENCOUNTER — Ambulatory Visit: Payer: PRIVATE HEALTH INSURANCE | Admitting: Family Medicine

## 2014-04-05 ENCOUNTER — Ambulatory Visit (INDEPENDENT_AMBULATORY_CARE_PROVIDER_SITE_OTHER): Payer: PRIVATE HEALTH INSURANCE | Admitting: Family Medicine

## 2014-04-05 ENCOUNTER — Other Ambulatory Visit: Payer: Self-pay | Admitting: Family Medicine

## 2014-04-05 ENCOUNTER — Encounter: Payer: Self-pay | Admitting: Family Medicine

## 2014-04-05 VITALS — BP 129/76 | HR 75 | Temp 98.6°F | Ht 66.0 in | Wt 280.0 lb

## 2014-04-05 DIAGNOSIS — R1031 Right lower quadrant pain: Secondary | ICD-10-CM

## 2014-04-05 DIAGNOSIS — I10 Essential (primary) hypertension: Secondary | ICD-10-CM

## 2014-04-05 DIAGNOSIS — R300 Dysuria: Secondary | ICD-10-CM

## 2014-04-05 DIAGNOSIS — Z23 Encounter for immunization: Secondary | ICD-10-CM

## 2014-04-05 DIAGNOSIS — L853 Xerosis cutis: Secondary | ICD-10-CM

## 2014-04-05 DIAGNOSIS — E875 Hyperkalemia: Secondary | ICD-10-CM | POA: Insufficient documentation

## 2014-04-05 LAB — POCT URINALYSIS DIPSTICK
Bilirubin, UA: NEGATIVE
Blood, UA: NEGATIVE
Glucose, UA: NEGATIVE
Ketones, UA: NEGATIVE
Nitrite, UA: NEGATIVE
Protein, UA: NEGATIVE
Spec Grav, UA: <=1.005
Urobilinogen, UA: 0.2
pH, UA: 5

## 2014-04-05 LAB — POCT UA - MICROSCOPIC ONLY: Epithelial cells, urine per micros: >20

## 2014-04-05 NOTE — Patient Instructions (Addendum)
Thank you so much for coming to see Brittany Roberts today! We will check your potassium and kidney function today with blood work.  We will contact you if your potassium level is still high. We may ask you to return depending on what these labs show. Your blood pressure is great today!   I think your abdominal pain and groin pain may be coming from a Urinary Tract Infection. Your urine showed did not show signs of infection, but we will send it for culture and contact you if you need to be placed on antibiotics. You may try Eucerin cream for your dry skin. At your next visit, please remember to bring all of your medication bottles so we know exactly what you're taking! Please schedule a visit with Dr. [name] in one month to recheck your blood pressure and discuss your medications.  Thanks again for coming to visit! Dr. Gerlean Ren

## 2014-04-05 NOTE — Progress Notes (Signed)
Subjective:     Patient ID: Brittany Roberts, female   DOB: 24-Feb-1941, 73 y.o.   MRN: [id number]  HPI Mrs. Beichner is a 73yo female presenting today for blood pressure check.  #Hypertension - Well controlled at 129/76 today - Seems to have some confusion concerning which medications she is taking.  States Metoprolol and Lisinopril have been discontinued. States she is still taking Spiranolactone and Furosemide.  #Hyperkalemia - Potassium was noted to be high at 6.1 on 9/8 and 9/11  -Trended up from 4.9 on 9/4 - Currently taking Spiranolactone, which may increase potassium levels. - States she only has one kidney because Lupus has "shrunk up" her other one - Would like her potassium levels and kidneys checked today.  #Hip and Abdominal Pain - States hip and abdominal pain occur together - Pain has been present for one year - Pain is hip is located in anterior groin - Pain in abdomen goes from RLQ into groin.  Location complicated by pannus. - States pain has been present for one year. - Had colonoscopy two months ago, where they removed one polyp - Believes this may be due to her Lupus - Denies blood in urine, dysuria, and vaginal discharge - States urine had a strong odor to it last week and was "foamy." Both of these lasted about three days and have since resolved - Admits to increased frequency (has urinated four times today which is not normal for her) and urgency  # Dry Skin - Notes dry skin today and would like recommendation for lotion.  Review of Systems  Respiratory: Positive for shortness of breath.        (at baseline)  Gastrointestinal: Positive for abdominal pain. Negative for constipation.  Endocrine: Negative for polydipsia.  Genitourinary: Positive for urgency and frequency. Negative for dysuria, hematuria and vaginal discharge.  Musculoskeletal: Positive for arthralgias and myalgias.  All other systems reviewed and are negative.      Objective:   Physical Exam   Constitutional: She appears well-developed and well-nourished. No distress.  HENT:  Head: Normocephalic and atraumatic.  Cardiovascular: Normal rate, regular rhythm and normal heart sounds.  Exam reveals no gallop and no friction rub.   No murmur heard. Pulmonary/Chest: Breath sounds normal. No respiratory distress. She has no wheezes. She has no rales.  Abdominal: Soft. Bowel sounds are normal. She exhibits no distension and no mass. There is no tenderness. There is no rebound and no guarding.  Despite reports of pain, no pain to palpation  Musculoskeletal:  Crepitus noted when placing knees through ROM bilaterally. Decreased ROM of hip with pain on right.  Skin: Skin is warm and dry. No rash noted.       Assessment:     Please see Problem List for Assessment.    Plan:     Please see Problem List for Plan.

## 2014-04-05 NOTE — Assessment & Plan Note (Signed)
-   At goal at 129/76 - States she is currently taking Spiranolactone and Furosemide, but seems mildly confused concerning which medications she is taking.  Instructed to return with medication bottles next time so we can be sure of what she is actually taking at home.

## 2014-04-05 NOTE — Assessment & Plan Note (Signed)
-   Potassium of 6.1 on 9/11 - Recheck BMP today. - Etiology renal losses secondary to CKD vs. Medication Adverse Effect (Sprinaolactone)  -Consider mediation change at next visit - Instructed to follow up with PCP in one month.  Discussed that visit may need to be sooner depending on BMP results and level of hyperkalemia. - Was taking Miralax in past but has stopped taking it.  Can be a treatment for Hyperkalemia so consider restarting if needed.

## 2014-04-06 DIAGNOSIS — L853 Xerosis cutis: Secondary | ICD-10-CM | POA: Insufficient documentation

## 2014-04-06 LAB — BASIC METABOLIC PANEL
BUN: 49 mg/dL — ABNORMAL HIGH (ref 6–23)
CO2: 20 mEq/L (ref 19–32)
Calcium: 9.2 mg/dL (ref 8.4–10.5)
Chloride: 104 mEq/L (ref 96–112)
Creat: 2.93 mg/dL — ABNORMAL HIGH (ref 0.50–1.10)
Glucose, Bld: 48 mg/dL — ABNORMAL LOW (ref 70–99)
Potassium: 5.6 mEq/L — ABNORMAL HIGH (ref 3.5–5.3)
Sodium: 134 mEq/L — ABNORMAL LOW (ref 135–145)

## 2014-04-06 NOTE — Assessment & Plan Note (Signed)
-   Recommended Eucerin cream.

## 2014-04-06 NOTE — Assessment & Plan Note (Signed)
-   Notes one year history - Seen by GI in past. CT of abdomen and colonoscopy normal except for one polyp - Due to odor to urine, urgency, and frequency, consider UTI as contributing to cause - Urinalysis showed trace leukocytes. Microscopic analysis showed yeast, many epithelial ells, 5-10 WBC, and trace bacteria.  - Urine sent for culture - Denies symptoms of yeast infection. Will not treat for yeast infection at this time. - If culture returns positive, consider addition of antibiotic to treat UTI

## 2014-04-07 LAB — URINE CULTURE: Colony Count: 2000

## 2014-04-15 ENCOUNTER — Telehealth: Payer: Self-pay | Admitting: Internal Medicine

## 2014-04-15 ENCOUNTER — Encounter: Payer: Self-pay | Admitting: Internal Medicine

## 2014-04-15 ENCOUNTER — Ambulatory Visit (INDEPENDENT_AMBULATORY_CARE_PROVIDER_SITE_OTHER): Payer: Medicare Other | Admitting: Internal Medicine

## 2014-04-15 VITALS — BP 110/68 | HR 81 | Ht 63.0 in | Wt 284.0 lb

## 2014-04-15 DIAGNOSIS — R0600 Dyspnea, unspecified: Secondary | ICD-10-CM | POA: Insufficient documentation

## 2014-04-15 NOTE — Progress Notes (Signed)
Subjective:    Patient ID: Brittany Roberts, female    DOB: February 13, 1941, 73 y.o.   MRN: [id number] PCP .Zigmund Gottron, MD   HPI    OV 09/27/12   HPI 73 yo with complex PMH significant for Obesity, deconditioning, diastolic dysfunction, Discoid Lupus erythematous and  Asthma presents to the clinic for dyspnea. We do have spirometry available on file from 03/28/12 FEV1 76%, FVC 81%, FEV1/FVC 71. She reports minimal cough , her main complaint is dyspnea at rest and with exertion, currently is not wearing oxygen at rest but does wear nocturnal oxygen for sleep apnea; she reports no recent episodes of asthma exacerbation and visits to the ED or hospitalizations.   Today she denies acute problems with fever or chills; increased dyspnea or cough, sick contacts,  nasal congestion, significantly improved with Nasonex, denies sinus pressure or GERD, though is taking PPIs routinely. Denies hemoptysis and paroxysmal nocturnal dyspnea.    At previous visit she endorsed chest pain, intermittent,  without associated wheezing, cough non pleuritic, at rest and with exertion. She had seen Dr. Nadyne Coombes and underwent catheterization with PTCI of a 90% coronary artery lesion.    has a past medical history of Depression; Lupus; Asthma; Diabetes mellitus; Hypertension; Hypothyroidism; Congestive heart failure, unspecified; Other and unspecified hyperlipidemia; Unspecified vitamin D deficiency; Renal failure, unspecified; and OSA (obstructive sleep apnea).   has past surgical history that includes Tubal ligation; Nasal sinus surgery; and Gallbladder surgery.  CT chest Oct 2013 IMPRESSION:  No definite evidence of pulmonary embolism though there are mild  limitations of exam secondary to slight respiratory motion and beam  hardening due to body habitus.  Minimal dependent atelectasis right lower lobe.  Question left thyroid mass 4.8 x 5.0 cm; dedicated thyroid  sonography recommended.  Original Report  Authenticated By: Burnetta Sabin, M.D.    A/P    1. Dyspnea with exertion most likely related to asthma/emphysema, OHS/OSA, CAD and deconditioning.  I advised her to use Flovent 110 mcg twice daily and continue Albuterol as needed for dyspnea. She has the Flovent at home but she has not been using it regularly; I think taking it daily might improve her symptoms.  She enroledl in pulmonary rehabilitation but was not able to go regularly due to frequent doctor's appointments.   CAD following with Dr. Nadyne Coombes for CAD; she reports no chest pain today.   2. Asthmatic bronchitis Current bronchodilators: Flovent, Albuterol and Proair.   Spirometry 9/13 Pre bronchodilators FEV1 1.276L 73%  FVC 2.482 L 108%                             Post bronchodilators FEV1 1.325 76%  FVC 1.864 L 73%  Continue current therapy. She reports taking the rescue inhaler 3-4 times weekly, reports no nighttime awakenings, no recent steroid bursts or antibiotics, no recent admissions for asthma exacerbations. Asthma partially controlled; she would benefit from improved control; this might be achieved with daily use of Flovent.  I reiterated the importance of taking the steroid inhaler every day.    3. DLE She received a recent prednisone taper due to LE edema bilived to be secondary to DLE a couple of months prior. There is a risk of progression of DLE to SLE. CTA showed no evidence of ILD related to lupus.   4. Allergic rhinitis Continue Nasonex spray. The patient reports nasal bleed in the past from Flonase. Nasal congestion improved with Nasonex.  5. GERD currently on Nexium 40 mg daily. Reports no symptoms on medication.    6. Obesity, OHS and OSA  On oxygen at night; no need for oxygen with ambulation. Advised to loose weight. Counseling provided in clinic; would benefit from a nutrition consult.  OSA she uses 2L oxygen at night; has not been able to tolerate CPAP, BiPAP in the past.   7. General health up to date on   flu and pneumococcus vaccination.   8. Disposition RTC in 3 months.    9. Enlarged thyroid on CT chest; seems to be slightly enlarged compared to previous study. We will defer further investigation to Dr. Vista Lawman.    [name], MD [facility]      REC Continue current medications. Use Flovent daily.  Please initiate [place] sinus rinses twice daily. Continue Nasonex.   We will schedule you to see Dr. Chase Caller in 3-4 months for routine follow up.   In the interim please call with any questions or health changes.    OV 12/26/2012   FU dyspnea. Used t see Dr Conception Chancy who [place] now [place]other [place]. States dyspnea is severe. Class 3. Exertional for 10 feet. Improved by rest. No asspicated chst pain, orthopnea, pnd, cough, chest pain. Stent to rCA in April 2014 did not help. BMI 50 and feels this is cause of dyspnea. Says wants to lose weight but PCP cannot help and she cannot afford ATkins or wEight watcher programs.  She wants my low glycemic diet sheet; I warned her that it might be unsafe in setting of her using insulin but she is adamant she take a look at it. She refuses to attend pulmonary rehab. She refuses to do CPST (cannot do it due to severe obesity). No ass edema, chest pain, orthopnea, no fver, no chills  Past, Family, Social reviewed: no change since last visit   Agree real problem with shportness of breath is weight FOllow low glycemic diet sheet  - I am not responsible for side effects from this diet that you might have especially you taking insulin  - You can talk to your OSEI-BONSU,GEORGE, MD about how to adjust insulin for this diet  - The sheet is only a list of foods that are good for weight loss (extreme left lane)  - You are using it as a guide at your own risk Continue inhalers  #Followup  6 months or sooner if needed  OV 10/04/2013   Chief Complaint  Patient presents with  . Follow-up    for SOB. Pt states she still has  SOB but this is not any worse.    Follow up dyspnea the setting of morbid obesity with a BMI of 50, coronary artery disease stent to the right coronary artery April 2014 and supposed asthma/copd and in the setting of discoid lupus erythematosus  Dyspnea is the same. She has not lost any weight yet she tried the low glycemic diet for a while but gave up. BMI continues to be above 50. She is compliant with her medications and Flovent which are not helping. She again does not want to do pulmonary stress test. She is looking for more inhalers. She has tried for an evaluation with [facility] [place] [facility] for bariatric surgery but apparently was turned down @ telephone triage system   reports that she quit smoking about 14 years ago. Her smoking use included Cigarettes. She has a 60 pack-year smoking history. She has never used  smokeless tobacco.   REC Try advair 100/50, 1 puff twice daily  Stop flovent REturn to see me in 6 months   OV 04/15/2014  Chief Complaint  Patient presents with  . Follow-up    Pt states she took the advair sample and states she didnt like the powder. Pt still taking flovent. Pt c/o SOB with activity and talking. Pt c/o dry cough. Pt denies CP/tightness.    Follow up dyspnea the setting of morbid obesity with a BMI of 50, coronary artery disease stent to the right coronary artery April 2014 and supposed asthma/copd  (prior 60  Pack smoker) and in the setting of discoid lupus erythematosus an ? Thyromegaly 2013 Ct playing a role    - continues to have class 3 dyspnea on exertion. No new issues. Not worse. Not better. Stable. Has not lost any weight Again refuses CPST o rehab. Uses walker. Discussed her thyroid mass left lower lobe with her - says she is aware and follows with Dr Jeanann Lewandowsky at [facility]; she is nto sure what the followup plan is. In terms of supposed asthma/copd: advair did not help her "I did not like it" . So back on flovent.   AT this point very litle for  pulmonary to contribute  Review of Systems  Constitutional: Negative for fever and unexpected weight change.  HENT: Negative for congestion, dental problem, ear pain, nosebleeds, postnasal drip, rhinorrhea, sinus pressure, sneezing, sore throat and trouble swallowing.   Eyes: Negative for redness and itching.  Respiratory: Positive for cough and shortness of breath. Negative for chest tightness and wheezing.   Cardiovascular: Positive for leg swelling. Negative for palpitations.  Gastrointestinal: Negative for nausea and vomiting.  Genitourinary: Negative for dysuria.  Musculoskeletal: Negative for joint swelling.  Skin: Negative for rash.  Neurological: Negative for headaches.  Hematological: Does not bruise/bleed easily.  Psychiatric/Behavioral: Negative for dysphoric mood. The patient is not nervous/anxious.    Current outpatient prescriptions:amLODipine (NORVASC) 10 MG tablet, Take 1 tablet by mouth daily., Disp: , Rfl: ;  aspirin EC 81 MG tablet, Take 81 mg by mouth daily., Disp: , Rfl: ;  atorvastatin (LIPITOR) 20 MG tablet, Take 1 tablet (20 mg total) by mouth daily., Disp: 90 tablet, Rfl: 3;  b complex vitamins capsule, Take 1 capsule by mouth daily., Disp: , Rfl:  B-D INS SYRINGE 0.5CC/30GX1/2" 30G X 1/2" 0.5 ML MISC, use as directed, Disp: 100 each, Rfl: PRN;  Biotin 5000 MCG TABS, Take 5,000 mcg by mouth daily., Disp: , Rfl: ;  cholecalciferol (VITAMIN D) 1000 UNITS tablet, Take 1,000 Units by mouth daily., Disp: , Rfl: ;  escitalopram (LEXAPRO) 20 MG tablet, Take 1 tablet by mouth daily. , Disp: , Rfl:  gabapentin (NEURONTIN) 300 MG capsule, Take 1 capsule (300 mg total) by mouth 2 (two) times daily., Disp: 60 capsule, Rfl: 11;  glucose blood test strip, Check blood sugar three times per day., Disp: 300 each, Rfl: 10;  insulin NPH Human (HUMULIN N,NOVOLIN N) 100 UNIT/ML injection, Inject 30 Units into the skin every morning., Disp: , Rfl: ;  isosorbide mononitrate (IMDUR) 60 MG 24 hr  tablet, Take 60 mg by mouth every morning., Disp: , Rfl:  levothyroxine (SYNTHROID, LEVOTHROID) 50 MCG tablet, Take 50 mcg by mouth daily before breakfast., Disp: , Rfl: ;  metoprolol succinate (TOPROL-XL) 25 MG 24 hr tablet, Take 1 tablet by mouth daily., Disp: , Rfl: ;  nitroGLYCERIN (NITROSTAT) 0.4 MG SL tablet, Place 1 tablet (0.4 mg total)  under the tongue every 5 (five) minutes as needed for chest pain., Disp: 20 tablet, Rfl: 12 pantoprazole (PROTONIX) 40 MG tablet, Take 1 tablet (40 mg total) by mouth 2 (two) times daily., Disp: 180 tablet, Rfl: 12;  Polyethyl Glycol-Propyl Glycol (SYSTANE OP), Place 1 drop into both eyes 4 (four) times daily., Disp: , Rfl: ;  spironolactone (ALDACTONE) 25 MG tablet, Take 1 tablet (25 mg total) by mouth 2 (two) times daily., Disp: 180 tablet, Rfl: 3 traMADol (ULTRAM) 50 MG tablet, TAKE 1 TABLET BY MOUTH 3 TIMES DAILY, Disp: 90 tablet, Rfl: 5;  traZODone (DESYREL) 50 MG tablet, Take 1 tablet (50 mg total) by mouth at bedtime as needed for sleep., Disp: 90 tablet, Rfl: 3;  VITAMIN A PO, Take 1 tablet by mouth daily., Disp: , Rfl:      Objective:   Physical Exam   Filed Vitals:   04/15/14 1007  BP: 110/68  Pulse: 81  Height: 5\' 3"  (1.6 m)  Weight: 284 lb (128.822 kg)  SpO2: 96%        itals reviewed. Constitutional: She is oriented to person, place, and time. She appears well-developed and well-nourished. No distress.    Estimated body mass index is 50.32 kg/(m^2) as calculated from the following:   Height as of this encounter: 5\' 3"  (1.6 m).   Weight as of this encounter: 284 lb (128.822 kg).  HENT:  Head: Normocephalic and atraumatic.  Right Ear: External ear normal.  Left Ear: External ear normal.  Mouth/Throat: Oropharynx is clear and moist. No oropharyngeal exudate.  Eyes: Conjunctivae and EOM are normal. Pupils are equal, round, and reactive to light. Right eye exhibits no discharge. Left eye exhibits no discharge. No scleral icterus.    Neck: Normal range of motion. Neck supple. No JVD present. No tracheal deviation present. No thyromegaly present.  Cardiovascular: Normal rate, regular rhythm, normal heart sounds and intact distal pulses.  Exam reveals no gallop and no friction rub.   No murmur heard. Pulmonary/Chest: Effort normal and breath sounds normal. No respiratory distress. She has no wheezes. She has no rales. She exhibits no tenderness.  Abdominal: Soft. Bowel sounds are normal. She exhibits no distension and no mass. There is no tenderness. There is no rebound and no guarding.  Musculoskeletal: Normal range of motion. She exhibits no edema and no tenderness.  Walker at her side  Lymphadenopathy:    She has no cervical adenopathy.  Neurological: She is alert and oriented to person, place, and time. She has normal reflexes. No cranial nerve deficit. She exhibits normal muscle tone. Coordination normal.  Skin: Skin is warm and dry. No rash noted. She is not diaphoretic. No erythema. No pallor.  Discoid lupus  Psychiatric: She has a normal mood and affect. Her behavior is normal. Judgment and thought content normal.        Assessment & Plan:  #Shortness of breath  - your shortness of breath is due to main factors but of these your weight, lack of physical fitness and heart are the main issues  - asthma/copd if present is mild ; too bad advair did not work - do not know if thyroid mass 5c left lobe in 2013 is playing a role  PLAN  -focus on losing weight  - for lungs : continue flovent. Your primary care doctor and adjust these if needed  - for thyroid: follow with Dr Jeanann Lewandowsky -  #followup  - follow with primry care doctor  - no active  followup in pulmonary   - return as needed   (> 50% of this 15 min visit spent in face to face counseling)   Dr. Brand Males, M.D., [facility].C.P Pulmonary and Critical Care Medicine Staff Physician [facility] Pager:  [phone], If no answer or between  15:00h - 7:00h: call [phone]  04/15/2014 10:37 AM

## 2014-04-15 NOTE — Telephone Encounter (Signed)
Dear Dr Gerlean Ren and Dr [name]  Your patient has a thyroid mas that showed up on CT scan. An FYI if you intend to act on it  THanks  Dr. Brand Males, M.D., [facility].C.P Pulmonary and Critical Care Medicine Staff Physician [facility] Pager: [phone], If no answer or between  15:00h - 7:00h: call [phone]  04/15/2014 10:23 AM

## 2014-04-15 NOTE — Patient Instructions (Addendum)
#  Shortness of breath  - your shortness of breath is due to main factors but of these your weight, lack of physical fitness and heart are the main issues  - asthma/copd if present is mild ; too bad advair did not work - do not know if thyroid mass 5c left lobe in 2013 is playing a role  PLAN  -focus on losing weight  - for lungs : continue flovent. Your primary care doctor and adjust these if needed  - for thyroid: follow with Dr Jeanann Lewandowsky -  #followup  - follow with primry care doctor  - no active followup in pulmonary   - return as needed

## 2014-04-16 NOTE — Telephone Encounter (Signed)
Routing to Dr. [name], her PCP.

## 2014-04-18 ENCOUNTER — Telehealth: Payer: Self-pay | Admitting: *Deleted

## 2014-04-18 NOTE — Telephone Encounter (Signed)
Attempted to call patient, no answer will try again later. Per Dr. [name] patient needs to come in for OV with him this week or next week to see him. Ok to double book please

## 2014-04-18 NOTE — Telephone Encounter (Signed)
Spoke with patient she is coming in on 10/23 @ 11:30 am to see dr. [name]

## 2014-04-18 NOTE — Telephone Encounter (Signed)
Nurse will call and set up appointment.  I need to follow up thyroid mass and elevated creat.

## 2014-04-26 ENCOUNTER — Ambulatory Visit (INDEPENDENT_AMBULATORY_CARE_PROVIDER_SITE_OTHER): Payer: Medicare Other | Admitting: Family Medicine

## 2014-04-26 ENCOUNTER — Encounter: Payer: Self-pay | Admitting: Family Medicine

## 2014-04-26 VITALS — BP 128/70 | HR 95 | Temp 98.1°F | Ht 63.0 in | Wt 282.4 lb

## 2014-04-26 DIAGNOSIS — N183 Chronic kidney disease, stage 3 unspecified: Secondary | ICD-10-CM

## 2014-04-26 DIAGNOSIS — E079 Disorder of thyroid, unspecified: Secondary | ICD-10-CM | POA: Insufficient documentation

## 2014-04-26 DIAGNOSIS — N179 Acute kidney failure, unspecified: Secondary | ICD-10-CM | POA: Insufficient documentation

## 2014-04-26 DIAGNOSIS — E039 Hypothyroidism, unspecified: Secondary | ICD-10-CM

## 2014-04-26 LAB — BASIC METABOLIC PANEL
BUN: 18 mg/dL (ref 6–23)
CO2: 24 mEq/L (ref 19–32)
Calcium: 9 mg/dL (ref 8.4–10.5)
Chloride: 106 mEq/L (ref 96–112)
Creat: 1.45 mg/dL — ABNORMAL HIGH (ref 0.50–1.10)
Glucose, Bld: 86 mg/dL (ref 70–99)
Potassium: 5 mEq/L (ref 3.5–5.3)
Sodium: 137 mEq/L (ref 135–145)

## 2014-04-26 NOTE — Assessment & Plan Note (Signed)
Recheck BMP off meds Also a renal ultrasound to rule out obstruction on the symptomatic right side.

## 2014-04-26 NOTE — Patient Instructions (Signed)
I will call you after the test results. Blood work to test your kidneys An ultrasound to check your kidneys. A scan to check your thyroid.

## 2014-04-26 NOTE — Assessment & Plan Note (Signed)
Check scan.  If mass confirmed, may need biopsy.

## 2014-04-26 NOTE — Assessment & Plan Note (Signed)
Recent normal TSH.  Taking synthroid

## 2014-04-26 NOTE — Progress Notes (Signed)
   Subjective:    Patient ID: Brittany Roberts, female    DOB: 1940-09-02, 73 y.o.   MRN: [id number]  HPI Comes in with daughter and all meds Med rec done by pill review Needs FU of acute on chronic renal failure.  Off diuretics and ACE.  Nicely, weight is down two pounds. Also off some of her BP meds.  Does state that she has had some right flank pain radiating to lower abd. Found to have thyroid mass on CT.  Recent normal TSH -     Review of Systems     Objective:   Physical Exam Thryoid difficult to evaluate due to short thick neck Lungs clear. Cardiac RRR without m or g Abd benign       Assessment & Plan:

## 2014-05-02 ENCOUNTER — Telehealth: Payer: Self-pay | Admitting: *Deleted

## 2014-05-02 NOTE — Telephone Encounter (Signed)
Tillie Rung from precert needs Enid Skeens, CMA to call her about this patients study. Cleatus Goodin, [name]

## 2014-05-03 ENCOUNTER — Ambulatory Visit (HOSPITAL_COMMUNITY)
Admission: RE | Admit: 2014-05-03 | Discharge: 2014-05-03 | Disposition: A | Discharge status: Home / Self Care | Payer: Medicare Other | Source: Ambulatory Visit | Attending: Family Medicine | Admitting: Family Medicine

## 2014-05-03 DIAGNOSIS — R109 Unspecified abdominal pain: Secondary | ICD-10-CM | POA: Diagnosis present

## 2014-05-03 DIAGNOSIS — N289 Disorder of kidney and ureter, unspecified: Secondary | ICD-10-CM | POA: Diagnosis present

## 2014-05-03 DIAGNOSIS — Q6102 Congenital multiple renal cysts: Secondary | ICD-10-CM | POA: Insufficient documentation

## 2014-05-03 DIAGNOSIS — N179 Acute kidney failure, unspecified: Secondary | ICD-10-CM | POA: Diagnosis present

## 2014-05-03 DIAGNOSIS — N183 Chronic kidney disease, stage 3 unspecified: Secondary | ICD-10-CM

## 2014-05-03 NOTE — Telephone Encounter (Signed)
Spoke with Tillie Rung precert number is [id number]

## 2014-05-03 NOTE — Telephone Encounter (Signed)
Coming in for Neuc thyroid uptake scan. It requires pre auth thru her ins [employer] and need this before twelve today. Call back at [phone] to [facility] asap.

## 2014-05-06 ENCOUNTER — Telehealth: Payer: Self-pay | Admitting: *Deleted

## 2014-05-06 ENCOUNTER — Ambulatory Visit (HOSPITAL_COMMUNITY)
Admission: RE | Admit: 2014-05-06 | Discharge: 2014-05-06 | Disposition: A | Discharge status: Home / Self Care | Payer: Medicare Other | Source: Ambulatory Visit | Attending: Family Medicine | Admitting: Family Medicine

## 2014-05-06 DIAGNOSIS — E079 Disorder of thyroid, unspecified: Secondary | ICD-10-CM

## 2014-05-06 NOTE — Telephone Encounter (Signed)
Dr. [name], Tillie Rung from pre cert called and stated thatthey needed the ICD code changed to 780.14, it was 780.12. Now per insurance you will need to talk to them again about the [facility] thyroid scan. Number to call is [phone] option #3 and the ref # is [id number]

## 2014-05-07 ENCOUNTER — Encounter (HOSPITAL_COMMUNITY): Payer: Medicare Other

## 2014-06-12 ENCOUNTER — Telehealth: Payer: Self-pay | Admitting: Family Medicine

## 2014-06-12 NOTE — Telephone Encounter (Signed)
Says the tests could not be done because she was taking synthroid. She has stopped taking them for 4 weeks. She needs another appt to have those tests done

## 2014-06-12 NOTE — Telephone Encounter (Signed)
My intention was to cancel the uptake scan and order an ultrasound.  The ultrasound can be done with patient taking her synthyroid.

## 2014-06-13 ENCOUNTER — Encounter (HOSPITAL_COMMUNITY): Payer: Self-pay | Admitting: Cardiology

## 2014-07-04 ENCOUNTER — Ambulatory Visit: Payer: Medicare Other | Admitting: Neurology

## 2014-07-05 DIAGNOSIS — M3210 Systemic lupus erythematosus, organ or system involvement unspecified: Secondary | ICD-10-CM | POA: Diagnosis not present

## 2014-07-06 DIAGNOSIS — M3210 Systemic lupus erythematosus, organ or system involvement unspecified: Secondary | ICD-10-CM | POA: Diagnosis not present

## 2014-07-07 DIAGNOSIS — M3210 Systemic lupus erythematosus, organ or system involvement unspecified: Secondary | ICD-10-CM | POA: Diagnosis not present

## 2014-07-08 DIAGNOSIS — M3210 Systemic lupus erythematosus, organ or system involvement unspecified: Secondary | ICD-10-CM | POA: Diagnosis not present

## 2014-07-09 ENCOUNTER — Encounter: Payer: Self-pay | Admitting: Neurology

## 2014-07-09 ENCOUNTER — Ambulatory Visit (INDEPENDENT_AMBULATORY_CARE_PROVIDER_SITE_OTHER): Payer: Medicare Other | Admitting: Neurology

## 2014-07-09 VITALS — BP 149/61 | HR 66 | Temp 98.6°F | Ht 64.0 in | Wt 288.0 lb

## 2014-07-09 DIAGNOSIS — E119 Type 2 diabetes mellitus without complications: Secondary | ICD-10-CM

## 2014-07-09 DIAGNOSIS — G2401 Drug induced subacute dyskinesia: Secondary | ICD-10-CM | POA: Diagnosis not present

## 2014-07-09 DIAGNOSIS — Z794 Long term (current) use of insulin: Secondary | ICD-10-CM | POA: Diagnosis not present

## 2014-07-09 DIAGNOSIS — I10 Essential (primary) hypertension: Secondary | ICD-10-CM | POA: Diagnosis not present

## 2014-07-09 DIAGNOSIS — L853 Xerosis cutis: Secondary | ICD-10-CM | POA: Diagnosis not present

## 2014-07-09 DIAGNOSIS — M3210 Systemic lupus erythematosus, organ or system involvement unspecified: Secondary | ICD-10-CM | POA: Diagnosis not present

## 2014-07-09 NOTE — Progress Notes (Signed)
[facility]  CC:  Follow up for Abnormal involuntary movements  HPI:  Brittany Roberts is a 74 y.o. female here as a follow up from Dr. [name] for abnormal involuntary movements of her face.   Initial visit was with Dr. Janann Colonel in May 2015, she reported symptoms began around 1 year ago in 2014. she had been taking reglan tid daily for a few years prior to symptoms. Was taking for severe GERD and stopped once she developed symptoms. Her symptoms improved slightly since stopping reglan, but has not resolved completely. She notes occasional episodes of difficulty swallowing and talking. She has noted a change in her speech, feels it is more slurred and has become more high pitched. Is harder to talk. Notes symptoms are just peri-oral, does not involve the periorbital region. Has not tried any medication for these symptoms.   She was recently evaluated by ENT for change in voice. They suspect symptoms likely related to history of GERD and also possibly related to tardive dyskinesia   She notes history of fibromyalgia, SLE and arthritis.  She was initially given a trial of amantadine for her tardive dyskinesia. She developed a rash and discontinued the medication. She was given a prescription for Liberty, but did not start it because she reports multiple new concerns. She notes new concern over intermittent generalized twitching of her muscles, described as brief myoclonic jerks. She notes feeling unsteady on her feet, feels off balance. No falls.   She takes gabapentin 600mg  TID. Most recent BMP showed a BUN of 44 and a Cr of 2.26. She had myoclonic body jerking movement, asterixis, which has improved after decreased gabapentin to 300 mg twice a day,  She continue has frequent mouth sucking, pucking movement, but overall very happy about current improvement does not want to make any changes   Review of Systems: Out of a complete 14 system review, the patient complains of only the  following symptoms, and all other reviewed systems are negative. Gait difficulty due to low back pain, knee pain  History   Social History  . Marital Status: Single    Spouse Name: N/A    Number of Children: 4  . Years of Education: 12 th   Occupational History  . RetiredMedia planner    Social History Main Topics  . Smoking status: Former Smoker -- 3.00 packs/day for 20 years    Types: Cigarettes    Quit date: 07/06/1999  . Smokeless tobacco: Never Used  . Alcohol Use: No  . Drug Use: No  . Sexual Activity: Not Currently   Other Topics Concern  . Not on file   Social History Narrative   Health Care POA:    Emergency Contact: daughter, Lorenso Courier [phone] or [name] [phone]   End of Life Plan: Does not want to be ventilated or feeding tubes.    Who lives with you: Lives with daughter Lorenso Courier   Any pets: none   Diet: Patient reports enjoying and eating junk food.  Does not regulate types of food and currently her dentures are broken so it is hard to eat some foods.   Exercise: Patient does not have an exercise plan.   Seatbelts: Patient reports wearing seatbelt when in vehicle.   Sun Exposure/Protection: Patient reports not going outside very often.   Hobbies: Patient enjoys reading the bible and watching game shows.           Family History  Problem Relation Age of Onset  . Heart  disease Mother   . Diabetes Mother   . Clotting disorder Mother   . Pneumonia Father   . Rheum arthritis Father   . Diabetes Sister   . Diabetes Sister   . Asthma Brother   . Colon cancer Neg Hx     Past Medical History  Diagnosis Date  . Depression   . Lupus   . Asthma   . Diabetes mellitus   . Hypertension   . Hypothyroidism   . Congestive heart failure, unspecified   . Other and unspecified hyperlipidemia   . Unspecified vitamin D deficiency   . Renal failure, unspecified   . History of oxygen administration     oxygen use 2l/m nasally during the night  .  Cataracts, both eyes   . OSA (obstructive sleep apnea)     no cpap used    Past Surgical History  Procedure Laterality Date  . Tubal ligation    . Nasal sinus surgery    . Gallbladder surgery    . Carpal tunnel release Bilateral   . Ankle arthroscopy with open reduction internal fixation (orif) Right     2-3 yrs ago-retained screws  . Cholecystectomy    . Colonoscopy N/A 12/10/2013    Procedure: COLONOSCOPY;  Surgeon: Inda Castle, MD;  Location: [facility];  Service: Endoscopy;  Laterality: N/A;  . Left heart catheterization with coronary angiogram N/A 08/08/2012    Procedure: LEFT HEART CATHETERIZATION WITH CORONARY ANGIOGRAM;  Surgeon: [name], MD;  Location: [facility] CATH LAB;  Service: Cardiovascular;  Laterality: N/A;  . Percutaneous coronary stent intervention (pci-s) N/A 08/21/2012    Procedure: PERCUTANEOUS CORONARY STENT INTERVENTION (PCI-S);  Surgeon: [name], MD;  Location: [facility] CATH LAB;  Service: Cardiovascular;  Laterality: N/A;    Current Outpatient Prescriptions  Medication Sig Dispense Refill  . aspirin EC 81 MG tablet Take 81 mg by mouth daily.    Marland Kitchen atorvastatin (LIPITOR) 20 MG tablet Take 1 tablet (20 mg total) by mouth daily. 90 tablet 3  . b complex vitamins capsule Take 1 capsule by mouth daily.    . B-D INS SYRINGE 0.5CC/30GX1/2" 30G X 1/2" 0.5 ML MISC use as directed 100 each PRN  . Biotin 5000 MCG TABS Take 5,000 mcg by mouth daily.    . cholecalciferol (VITAMIN D) 1000 UNITS tablet Take 1,000 Units by mouth daily.    Marland Kitchen escitalopram (LEXAPRO) 20 MG tablet Take 1 tablet by mouth daily.     Marland Kitchen gabapentin (NEURONTIN) 300 MG capsule Take 1 capsule (300 mg total) by mouth 2 (two) times daily. 60 capsule 11  . glucose blood test strip Check blood sugar three times per day. 300 each 10  . insulin NPH Human (HUMULIN N,NOVOLIN N) 100 UNIT/ML injection Inject 30 Units into the skin every morning.    . isosorbide mononitrate (IMDUR) 60 MG 24 hr tablet Take  60 mg by mouth every morning.    Marland Kitchen levothyroxine (SYNTHROID, LEVOTHROID) 50 MCG tablet Take 50 mcg by mouth daily before breakfast.    . metoprolol succinate (TOPROL-XL) 25 MG 24 hr tablet Take 1 tablet by mouth daily.    . nitroGLYCERIN (NITROSTAT) 0.4 MG SL tablet Place 1 tablet (0.4 mg total) under the tongue every 5 (five) minutes as needed for chest pain. 20 tablet 12  . pantoprazole (PROTONIX) 40 MG tablet Take 1 tablet (40 mg total) by mouth 2 (two) times daily. 180 tablet 12  . Polyethyl Glycol-Propyl Glycol (SYSTANE OP) Place  1 drop into both eyes 4 (four) times daily.    Marland Kitchen spironolactone (ALDACTONE) 25 MG tablet Take 1 tablet (25 mg total) by mouth 2 (two) times daily. 180 tablet 3  . traMADol (ULTRAM) 50 MG tablet TAKE 1 TABLET BY MOUTH 3 TIMES DAILY 90 tablet 5  . traZODone (DESYREL) 50 MG tablet Take 1 tablet (50 mg total) by mouth at bedtime as needed for sleep. 90 tablet 3  . VITAMIN A PO Take 1 tablet by mouth daily.     No current facility-administered medications for this visit.    Allergies as of 07/09/2014 - Review Complete 07/09/2014  Allergen Reaction Noted  . Ace inhibitors  03/12/2014  . Pregabalin    . Ropinirole hydrochloride  03/07/2007  . Amantadines Rash 11/30/2013  . Penicillins Swelling and Rash 03/07/2007    Vitals: BP 149/61 mmHg  Pulse 66  Temp(Src) 98.6 F (37 C)  Ht 5\' 4"  (1.626 m)  Wt 288 lb (130.636 kg)  BMI 49.41 kg/m2 Last Weight:  Wt Readings from Last 1 Encounters:  07/09/14 288 lb (130.636 kg)   Last Height:   Ht Readings from Last 1 Encounters:  07/09/14 5\' 4"  (1.626 m)    PHYSICAL EXAMINATOINS:  Generalized: In no acute distress  Neck: Supple, no carotid bruits   Cardiac: Regular rate rhythm  Pulmonary: Clear to auscultation bilaterally  Musculoskeletal: No deformity  Neurological examination  Mentation: Alert oriented to time, place, history taking, and causual conversation  Cranial nerve II-XII: Pupils were equal  round reactive to light extraocular movements were full, visual field were full on confrontational test.  Bilateral fundi were sharp  Facial sensation and strength were normal. hearing was intact to finger rubbing bilaterally. Uvula tongue midline.  head turning and shoulder shrug and were normal and symmetric.Tongue protrusion into cheek strength was normal.  Motor: normal tone, bulk and strength.  Sensory: Intact to fine touch, pinprick, preserved vibratory sensation, and proprioception at toes.  Coordination: Normal finger to nose, heel-to-shin bilaterally there was no truncal ataxia  Gait:  Pushing on chair arm to get up from seated position, cautious, atalgic gait  Deep tendon reflexes: Brachioradialis 2/2, biceps 2/2, triceps 2/2, patellar 2/2, Achilles trace, plantar responses were flexor bilaterally.   Assessment/plan: 74 years old female, with past medical history of tardive dyskinesia due to prolonged use of Reglan, has improved out stopping the medication, she has chronic kidney disease, also developed myoclonic movement with high dose of gabapentin, which also improved after lowering the dose,  Continue low dose of gabapentin, Return to clinic in 6 months  Marcial Pacas M.D. [facility] [address] [place], [place] [address]  Phone [phone] Fax [phone]

## 2014-07-10 ENCOUNTER — Telehealth: Payer: Self-pay | Admitting: Family Medicine

## 2014-07-10 DIAGNOSIS — M3210 Systemic lupus erythematosus, organ or system involvement unspecified: Secondary | ICD-10-CM | POA: Diagnosis not present

## 2014-07-10 NOTE — Telephone Encounter (Signed)
Pt called and would like to speak to Dr. [name]. jw

## 2014-07-11 DIAGNOSIS — M3210 Systemic lupus erythematosus, organ or system involvement unspecified: Secondary | ICD-10-CM | POA: Diagnosis not present

## 2014-07-11 NOTE — Telephone Encounter (Signed)
Agree with plan as outlined by Delray Alt.

## 2014-07-11 NOTE — Telephone Encounter (Signed)
Pt wanted to know when we were going to do the [place] because she didn't hear anything back from [place]. She said she felt like something was going on with her throat. i did schedule her [place] that was ordered in November and her appt is Tuesday January 12th @ 1:00. Pt informed of appt. Aliah Eriksson Kennon Holter, CMA

## 2014-07-12 DIAGNOSIS — M3210 Systemic lupus erythematosus, organ or system involvement unspecified: Secondary | ICD-10-CM | POA: Diagnosis not present

## 2014-07-13 DIAGNOSIS — M3210 Systemic lupus erythematosus, organ or system involvement unspecified: Secondary | ICD-10-CM | POA: Diagnosis not present

## 2014-07-14 DIAGNOSIS — J45909 Unspecified asthma, uncomplicated: Secondary | ICD-10-CM | POA: Diagnosis not present

## 2014-07-14 DIAGNOSIS — M3210 Systemic lupus erythematosus, organ or system involvement unspecified: Secondary | ICD-10-CM | POA: Diagnosis not present

## 2014-07-15 DIAGNOSIS — M3210 Systemic lupus erythematosus, organ or system involvement unspecified: Secondary | ICD-10-CM | POA: Diagnosis not present

## 2014-07-16 ENCOUNTER — Ambulatory Visit (HOSPITAL_COMMUNITY)
Admission: RE | Admit: 2014-07-16 | Discharge: 2014-07-16 | Disposition: A | Discharge status: Home / Self Care | Payer: Medicare Other | Source: Ambulatory Visit | Attending: Family Medicine | Admitting: Family Medicine

## 2014-07-16 DIAGNOSIS — E079 Disorder of thyroid, unspecified: Secondary | ICD-10-CM | POA: Diagnosis not present

## 2014-07-16 DIAGNOSIS — E049 Nontoxic goiter, unspecified: Secondary | ICD-10-CM | POA: Diagnosis not present

## 2014-07-16 DIAGNOSIS — M3210 Systemic lupus erythematosus, organ or system involvement unspecified: Secondary | ICD-10-CM | POA: Diagnosis not present

## 2014-07-17 ENCOUNTER — Telehealth: Payer: Self-pay | Admitting: Family Medicine

## 2014-07-17 ENCOUNTER — Telehealth: Payer: Self-pay | Admitting: *Deleted

## 2014-07-17 DIAGNOSIS — M3210 Systemic lupus erythematosus, organ or system involvement unspecified: Secondary | ICD-10-CM | POA: Diagnosis not present

## 2014-07-17 NOTE — Telephone Encounter (Signed)
Spoke with patient and she is coming in on Wednesday 05/25/2015 @ 1:45

## 2014-07-17 NOTE — Telephone Encounter (Signed)
-----   Message from Zigmund Gottron, MD sent at 07/17/2014 10:53 AM EST ----- Please call Ms Yiu and ask her to come in to discuss her thyroid ultrasound results.   ----- Message -----    From: Rad Results In Interface    Sent: 07/16/2014   5:23 PM      To: Zigmund Gottron, MD

## 2014-07-17 NOTE — Telephone Encounter (Signed)
Pt called and would like to speak to [facility] concerning her appointment. jw

## 2014-07-18 DIAGNOSIS — M3210 Systemic lupus erythematosus, organ or system involvement unspecified: Secondary | ICD-10-CM | POA: Diagnosis not present

## 2014-07-18 NOTE — Telephone Encounter (Signed)
Spoke with patient and appointment has been rescheduled for 07/19/2014 @ 11:30am

## 2014-07-19 ENCOUNTER — Ambulatory Visit (INDEPENDENT_AMBULATORY_CARE_PROVIDER_SITE_OTHER): Payer: Medicare Other | Admitting: Family Medicine

## 2014-07-19 ENCOUNTER — Encounter: Payer: Self-pay | Admitting: Family Medicine

## 2014-07-19 VITALS — BP 132/75 | HR 68 | Temp 98.2°F | Ht 64.0 in | Wt 283.0 lb

## 2014-07-19 DIAGNOSIS — N182 Chronic kidney disease, stage 2 (mild): Secondary | ICD-10-CM | POA: Diagnosis not present

## 2014-07-19 DIAGNOSIS — E039 Hypothyroidism, unspecified: Secondary | ICD-10-CM | POA: Diagnosis not present

## 2014-07-19 DIAGNOSIS — E079 Disorder of thyroid, unspecified: Secondary | ICD-10-CM

## 2014-07-19 DIAGNOSIS — M3210 Systemic lupus erythematosus, organ or system involvement unspecified: Secondary | ICD-10-CM | POA: Diagnosis not present

## 2014-07-19 DIAGNOSIS — I10 Essential (primary) hypertension: Secondary | ICD-10-CM

## 2014-07-19 DIAGNOSIS — E1140 Type 2 diabetes mellitus with diabetic neuropathy, unspecified: Secondary | ICD-10-CM

## 2014-07-19 LAB — POCT GLYCOSYLATED HEMOGLOBIN (HGB A1C): Hemoglobin A1C: 5.8

## 2014-07-19 LAB — BASIC METABOLIC PANEL
BUN: 16 mg/dL (ref 6–23)
CO2: 23 mEq/L (ref 19–32)
Calcium: 8.7 mg/dL (ref 8.4–10.5)
Chloride: 108 mEq/L (ref 96–112)
Creat: 1.6 mg/dL — ABNORMAL HIGH (ref 0.50–1.10)
Glucose, Bld: 95 mg/dL (ref 70–99)
Potassium: 5.6 mEq/L — ABNORMAL HIGH (ref 3.5–5.3)
Sodium: 137 mEq/L (ref 135–145)

## 2014-07-19 LAB — LIPID PANEL
Cholesterol: 103 mg/dL (ref 0–200)
HDL: 38 mg/dL — ABNORMAL LOW (ref >39)
LDL Cholesterol: 49 mg/dL (ref 0–99)
Total CHOL/HDL Ratio: 2.7 Ratio
Triglycerides: 81 mg/dL (ref <150)
VLDL: 16 mg/dL (ref 0–40)

## 2014-07-19 LAB — TSH: TSH: 1.049 u[IU]/mL (ref 0.350–4.500)

## 2014-07-19 NOTE — Assessment & Plan Note (Signed)
Excellent improvement since stopping furosemide.  Recheck.

## 2014-07-19 NOTE — Assessment & Plan Note (Signed)
Well controled.  Check labs

## 2014-07-19 NOTE — Patient Instructions (Signed)
I will call with the results of the blood work. My nurse will set you up for the thyroid biopsy

## 2014-07-19 NOTE — Assessment & Plan Note (Signed)
She was only taking 50 micrograms of synthyroid daily.  It was held during considerations for uptake scan.  Never restarted.  Recheck TSH.

## 2014-07-19 NOTE — Progress Notes (Signed)
   Subjective:    Patient ID: Brittany Roberts, female    DOB: 04/11/1941, 74 y.o.   MRN: [id number]  HPI  Here to discuss thyroid ultrasound results.  Also due for recheck of several labs.  She is doing well and has no complaints.    Review of Systems     Objective:   Physical Exam Enlarged left lobe of thyroid.       Assessment & Plan:

## 2014-07-19 NOTE — Assessment & Plan Note (Signed)
Certainly meets criteria for biopsy.  I am reassured by small growth over 10 years.  Still, with a patient-centered decision, we will proceed with biopsy.

## 2014-07-20 DIAGNOSIS — M3210 Systemic lupus erythematosus, organ or system involvement unspecified: Secondary | ICD-10-CM | POA: Diagnosis not present

## 2014-07-20 LAB — CBC
HCT: 35.5 % — ABNORMAL LOW (ref 36.0–46.0)
Hemoglobin: 11.1 g/dL — ABNORMAL LOW (ref 12.0–15.0)
MCH: 24.6 pg — ABNORMAL LOW (ref 26.0–34.0)
MCHC: 31.3 g/dL (ref 30.0–36.0)
MCV: 78.7 fL (ref 78.0–100.0)
MPV: 9.9 fL (ref 8.6–12.4)
Platelets: 231 K/uL (ref 150–400)
RBC: 4.51 MIL/uL (ref 3.87–5.11)
RDW: 15.1 % (ref 11.5–15.5)
WBC: 8.7 K/uL (ref 4.0–10.5)

## 2014-07-21 DIAGNOSIS — M3210 Systemic lupus erythematosus, organ or system involvement unspecified: Secondary | ICD-10-CM | POA: Diagnosis not present

## 2014-07-22 ENCOUNTER — Encounter: Payer: Self-pay | Admitting: Family Medicine

## 2014-07-22 DIAGNOSIS — M3210 Systemic lupus erythematosus, organ or system involvement unspecified: Secondary | ICD-10-CM | POA: Diagnosis not present

## 2014-07-23 ENCOUNTER — Other Ambulatory Visit: Payer: Self-pay | Admitting: Family Medicine

## 2014-07-23 DIAGNOSIS — E079 Disorder of thyroid, unspecified: Secondary | ICD-10-CM

## 2014-07-23 DIAGNOSIS — M3210 Systemic lupus erythematosus, organ or system involvement unspecified: Secondary | ICD-10-CM | POA: Diagnosis not present

## 2014-07-24 ENCOUNTER — Ambulatory Visit: Payer: Medicare Other | Admitting: Family Medicine

## 2014-07-24 DIAGNOSIS — M3210 Systemic lupus erythematosus, organ or system involvement unspecified: Secondary | ICD-10-CM | POA: Diagnosis not present

## 2014-07-25 DIAGNOSIS — M3210 Systemic lupus erythematosus, organ or system involvement unspecified: Secondary | ICD-10-CM | POA: Diagnosis not present

## 2014-07-29 DIAGNOSIS — H25011 Cortical age-related cataract, right eye: Secondary | ICD-10-CM | POA: Diagnosis not present

## 2014-07-29 DIAGNOSIS — M3210 Systemic lupus erythematosus, organ or system involvement unspecified: Secondary | ICD-10-CM | POA: Diagnosis not present

## 2014-07-29 DIAGNOSIS — H25012 Cortical age-related cataract, left eye: Secondary | ICD-10-CM | POA: Diagnosis not present

## 2014-07-29 DIAGNOSIS — H35371 Puckering of macula, right eye: Secondary | ICD-10-CM | POA: Diagnosis not present

## 2014-07-29 DIAGNOSIS — H2512 Age-related nuclear cataract, left eye: Secondary | ICD-10-CM | POA: Diagnosis not present

## 2014-07-29 DIAGNOSIS — H2511 Age-related nuclear cataract, right eye: Secondary | ICD-10-CM | POA: Diagnosis not present

## 2014-07-30 ENCOUNTER — Other Ambulatory Visit: Payer: Self-pay | Admitting: Family Medicine

## 2014-07-30 DIAGNOSIS — M3210 Systemic lupus erythematosus, organ or system involvement unspecified: Secondary | ICD-10-CM | POA: Diagnosis not present

## 2014-07-31 ENCOUNTER — Other Ambulatory Visit: Payer: Self-pay | Admitting: Family Medicine

## 2014-07-31 ENCOUNTER — Ambulatory Visit (HOSPITAL_COMMUNITY)
Admission: RE | Admit: 2014-07-31 | Discharge: 2014-07-31 | Disposition: A | Discharge status: Home / Self Care | Payer: Medicare Other | Source: Ambulatory Visit | Attending: Family Medicine | Admitting: Family Medicine

## 2014-07-31 DIAGNOSIS — E079 Disorder of thyroid, unspecified: Secondary | ICD-10-CM

## 2014-07-31 DIAGNOSIS — E041 Nontoxic single thyroid nodule: Secondary | ICD-10-CM | POA: Diagnosis not present

## 2014-07-31 DIAGNOSIS — E042 Nontoxic multinodular goiter: Secondary | ICD-10-CM | POA: Insufficient documentation

## 2014-07-31 DIAGNOSIS — D34 Benign neoplasm of thyroid gland: Secondary | ICD-10-CM | POA: Diagnosis not present

## 2014-07-31 DIAGNOSIS — M3210 Systemic lupus erythematosus, organ or system involvement unspecified: Secondary | ICD-10-CM | POA: Diagnosis not present

## 2014-07-31 MED ORDER — LIDOCAINE HCL (PF) 1 % IJ SOLN
INTRAMUSCULAR | Status: DC
Start: 2014-07-31 — End: 2014-08-01
  Filled 2014-07-31: qty 10

## 2014-07-31 NOTE — Procedures (Signed)
Interventional Radiology Procedure Note  Procedure: US guided bx of 2 separate left thyroid nodules.  .  Complications: No immediate Recommendations:   - Routine care   Signed,  Dulcy Fanny. Earleen Newport, DO

## 2014-08-01 ENCOUNTER — Encounter (HOSPITAL_COMMUNITY): Payer: Self-pay

## 2014-08-01 ENCOUNTER — Emergency Department (HOSPITAL_COMMUNITY): Payer: Medicare Other

## 2014-08-01 ENCOUNTER — Observation Stay (HOSPITAL_COMMUNITY)
Admission: EM | Admit: 2014-08-01 | Discharge: 2014-08-04 | Disposition: A | Discharge status: Home / Self Care | Payer: Medicare Other | Attending: Family Medicine | Admitting: Family Medicine

## 2014-08-01 DIAGNOSIS — I161 Hypertensive emergency: Secondary | ICD-10-CM | POA: Diagnosis present

## 2014-08-01 DIAGNOSIS — E559 Vitamin D deficiency, unspecified: Secondary | ICD-10-CM | POA: Diagnosis not present

## 2014-08-01 DIAGNOSIS — E039 Hypothyroidism, unspecified: Secondary | ICD-10-CM | POA: Diagnosis not present

## 2014-08-01 DIAGNOSIS — E785 Hyperlipidemia, unspecified: Secondary | ICD-10-CM | POA: Diagnosis not present

## 2014-08-01 DIAGNOSIS — IMO0002 Reserved for concepts with insufficient information to code with codable children: Secondary | ICD-10-CM | POA: Diagnosis present

## 2014-08-01 DIAGNOSIS — J449 Chronic obstructive pulmonary disease, unspecified: Secondary | ICD-10-CM | POA: Diagnosis present

## 2014-08-01 DIAGNOSIS — N183 Chronic kidney disease, stage 3 unspecified: Secondary | ICD-10-CM | POA: Diagnosis present

## 2014-08-01 DIAGNOSIS — I129 Hypertensive chronic kidney disease with stage 1 through stage 4 chronic kidney disease, or unspecified chronic kidney disease: Secondary | ICD-10-CM | POA: Diagnosis not present

## 2014-08-01 DIAGNOSIS — M329 Systemic lupus erythematosus, unspecified: Secondary | ICD-10-CM | POA: Insufficient documentation

## 2014-08-01 DIAGNOSIS — G4700 Insomnia, unspecified: Secondary | ICD-10-CM | POA: Diagnosis not present

## 2014-08-01 DIAGNOSIS — I5032 Chronic diastolic (congestive) heart failure: Secondary | ICD-10-CM | POA: Diagnosis not present

## 2014-08-01 DIAGNOSIS — I440 Atrioventricular block, first degree: Secondary | ICD-10-CM | POA: Insufficient documentation

## 2014-08-01 DIAGNOSIS — J45909 Unspecified asthma, uncomplicated: Secondary | ICD-10-CM | POA: Diagnosis present

## 2014-08-01 DIAGNOSIS — N189 Chronic kidney disease, unspecified: Secondary | ICD-10-CM | POA: Insufficient documentation

## 2014-08-01 DIAGNOSIS — R51 Headache: Secondary | ICD-10-CM | POA: Diagnosis not present

## 2014-08-01 DIAGNOSIS — M353 Polymyalgia rheumatica: Secondary | ICD-10-CM | POA: Diagnosis present

## 2014-08-01 DIAGNOSIS — M3210 Systemic lupus erythematosus, organ or system involvement unspecified: Secondary | ICD-10-CM | POA: Diagnosis not present

## 2014-08-01 DIAGNOSIS — I5042 Chronic combined systolic (congestive) and diastolic (congestive) heart failure: Secondary | ICD-10-CM | POA: Diagnosis present

## 2014-08-01 DIAGNOSIS — R0602 Shortness of breath: Secondary | ICD-10-CM

## 2014-08-01 DIAGNOSIS — R519 Headache, unspecified: Secondary | ICD-10-CM | POA: Insufficient documentation

## 2014-08-01 DIAGNOSIS — N281 Cyst of kidney, acquired: Secondary | ICD-10-CM

## 2014-08-01 DIAGNOSIS — I2510 Atherosclerotic heart disease of native coronary artery without angina pectoris: Secondary | ICD-10-CM | POA: Diagnosis present

## 2014-08-01 DIAGNOSIS — I10 Essential (primary) hypertension: Secondary | ICD-10-CM | POA: Diagnosis not present

## 2014-08-01 DIAGNOSIS — J42 Unspecified chronic bronchitis: Secondary | ICD-10-CM | POA: Diagnosis not present

## 2014-08-01 DIAGNOSIS — E875 Hyperkalemia: Secondary | ICD-10-CM | POA: Insufficient documentation

## 2014-08-01 DIAGNOSIS — F329 Major depressive disorder, single episode, unspecified: Secondary | ICD-10-CM | POA: Insufficient documentation

## 2014-08-01 DIAGNOSIS — E118 Type 2 diabetes mellitus with unspecified complications: Secondary | ICD-10-CM | POA: Insufficient documentation

## 2014-08-01 DIAGNOSIS — R700 Elevated erythrocyte sedimentation rate: Secondary | ICD-10-CM | POA: Diagnosis not present

## 2014-08-01 DIAGNOSIS — I160 Hypertensive urgency: Secondary | ICD-10-CM | POA: Diagnosis present

## 2014-08-01 DIAGNOSIS — N179 Acute kidney failure, unspecified: Secondary | ICD-10-CM | POA: Diagnosis not present

## 2014-08-01 DIAGNOSIS — E119 Type 2 diabetes mellitus without complications: Secondary | ICD-10-CM | POA: Insufficient documentation

## 2014-08-01 DIAGNOSIS — I288 Other diseases of pulmonary vessels: Secondary | ICD-10-CM | POA: Diagnosis not present

## 2014-08-01 DIAGNOSIS — E079 Disorder of thyroid, unspecified: Secondary | ICD-10-CM | POA: Diagnosis present

## 2014-08-01 DIAGNOSIS — Q619 Cystic kidney disease, unspecified: Secondary | ICD-10-CM

## 2014-08-01 DIAGNOSIS — I517 Cardiomegaly: Secondary | ICD-10-CM | POA: Insufficient documentation

## 2014-08-01 DIAGNOSIS — G4733 Obstructive sleep apnea (adult) (pediatric): Secondary | ICD-10-CM | POA: Diagnosis not present

## 2014-08-01 DIAGNOSIS — J320 Chronic maxillary sinusitis: Secondary | ICD-10-CM | POA: Diagnosis not present

## 2014-08-01 DIAGNOSIS — I5022 Chronic systolic (congestive) heart failure: Secondary | ICD-10-CM | POA: Diagnosis present

## 2014-08-01 DIAGNOSIS — I110 Hypertensive heart disease with heart failure: Secondary | ICD-10-CM | POA: Diagnosis present

## 2014-08-01 DIAGNOSIS — R52 Pain, unspecified: Secondary | ICD-10-CM | POA: Diagnosis present

## 2014-08-01 DIAGNOSIS — I25118 Atherosclerotic heart disease of native coronary artery with other forms of angina pectoris: Secondary | ICD-10-CM | POA: Diagnosis present

## 2014-08-01 LAB — URINALYSIS, ROUTINE W REFLEX MICROSCOPIC
Bilirubin Urine: NEGATIVE
Glucose, UA: NEGATIVE mg/dL
Hgb urine dipstick: NEGATIVE
Ketones, ur: NEGATIVE mg/dL
Leukocytes, UA: NEGATIVE
Nitrite: NEGATIVE
Protein, ur: 30 mg/dL — AB
Specific Gravity, Urine: 1.009 (ref 1.005–1.030)
Urobilinogen, UA: 0.2 mg/dL (ref 0.0–1.0)
pH: 6 (ref 5.0–8.0)

## 2014-08-01 LAB — URINE MICROSCOPIC-ADD ON

## 2014-08-01 LAB — CBC WITH DIFFERENTIAL/PLATELET
Basophils Absolute: 0 10*3/uL (ref 0.0–0.1)
Basophils Relative: 0 % (ref 0–1)
Eosinophils Absolute: 0.1 10*3/uL (ref 0.0–0.7)
Eosinophils Relative: 1 % (ref 0–5)
HCT: 39.6 % (ref 36.0–46.0)
Hemoglobin: 10.6 g/dL — ABNORMAL LOW (ref 12.0–15.0)
Lymphocytes Relative: 22 % (ref 12–46)
Lymphs Abs: 2.2 10*3/uL (ref 0.7–4.0)
MCH: 24.9 pg — ABNORMAL LOW (ref 26.0–34.0)
MCHC: 26.8 g/dL — ABNORMAL LOW (ref 30.0–36.0)
MCV: 93.2 fL (ref 78.0–100.0)
Monocytes Absolute: 0.6 10*3/uL (ref 0.1–1.0)
Monocytes Relative: 6 % (ref 3–12)
Neutro Abs: 7.3 10*3/uL (ref 1.7–7.7)
Neutrophils Relative %: 71 % (ref 43–77)
Platelets: 189 10*3/uL (ref 150–400)
RBC: 4.25 MIL/uL (ref 3.87–5.11)
RDW: 13.6 % (ref 11.5–15.5)
WBC: 10.3 10*3/uL (ref 4.0–10.5)

## 2014-08-01 LAB — BASIC METABOLIC PANEL
Anion gap: 5 (ref 5–15)
BUN: 13 mg/dL (ref 6–23)
CO2: 24 mmol/L (ref 19–32)
Calcium: 9.1 mg/dL (ref 8.4–10.5)
Chloride: 107 mmol/L (ref 96–112)
Creatinine, Ser: 1.25 mg/dL — ABNORMAL HIGH (ref 0.50–1.10)
GFR calc Af Amer: 48 mL/min — ABNORMAL LOW (ref >90)
GFR calc non Af Amer: 41 mL/min — ABNORMAL LOW (ref >90)
Glucose, Bld: 131 mg/dL — ABNORMAL HIGH (ref 70–99)
Potassium: 5.1 mmol/L (ref 3.5–5.1)
Sodium: 136 mmol/L (ref 135–145)

## 2014-08-01 LAB — TROPONIN I: Troponin I: <0.03 ng/mL (ref <0.031)

## 2014-08-01 LAB — PROTIME-INR
INR: 1.08 (ref 0.00–1.49)
Prothrombin Time: 14.1 seconds (ref 11.6–15.2)

## 2014-08-01 LAB — BRAIN NATRIURETIC PEPTIDE: B Natriuretic Peptide: 66.7 pg/mL (ref 0.0–100.0)

## 2014-08-01 LAB — CBG MONITORING, ED: Glucose-Capillary: 121 mg/dL — ABNORMAL HIGH (ref 70–99)

## 2014-08-01 LAB — SEDIMENTATION RATE: Sed Rate: 52 mm/hr — ABNORMAL HIGH (ref 0–22)

## 2014-08-01 MED ORDER — METHYLPREDNISOLONE SODIUM SUCC 125 MG IJ SOLR
125.0000 mg | Freq: Once | INTRAMUSCULAR | Status: AC
  Administered 2014-08-02: 125 mg via INTRAVENOUS
  Filled 2014-08-01: qty 2

## 2014-08-01 MED ORDER — PROCHLORPERAZINE EDISYLATE 5 MG/ML IJ SOLN
10.0000 mg | Freq: Once | INTRAMUSCULAR | Status: AC
  Administered 2014-08-01: 10 mg via INTRAVENOUS
  Filled 2014-08-01: qty 2

## 2014-08-01 MED ORDER — METOPROLOL TARTRATE 1 MG/ML IV SOLN
10.0000 mg | Freq: Once | INTRAVENOUS | Status: AC
  Administered 2014-08-02: 10 mg via INTRAVENOUS
  Filled 2014-08-01: qty 10

## 2014-08-01 MED ORDER — METOPROLOL SUCCINATE ER 25 MG PO TB24
25.0000 mg | ORAL_TABLET | Freq: Every day | ORAL | Status: DC
  Administered 2014-08-01: 25 mg via ORAL
  Filled 2014-08-01: qty 1

## 2014-08-01 MED ORDER — METOCLOPRAMIDE HCL 5 MG/ML IJ SOLN: 10.0000 mg | Freq: Once | INTRAMUSCULAR | Status: DC

## 2014-08-01 MED ORDER — DIPHENHYDRAMINE HCL 50 MG/ML IJ SOLN
25.0000 mg | Freq: Once | INTRAMUSCULAR | Status: AC
  Administered 2014-08-01: 25 mg via INTRAVENOUS
  Filled 2014-08-01: qty 1

## 2014-08-01 NOTE — ED Notes (Signed)
Pt reporting blurred vision with headache.

## 2014-08-01 NOTE — ED Provider Notes (Signed)
CSN: [id number]     Arrival date & time 08/01/14  2103 History   First MD Initiated Contact with Patient 08/01/14 2113     Chief Complaint  Patient presents with  . Headache    (Consider location/radiation/quality/duration/timing/severity/associated sxs/prior Treatment) HPI Comments: Patient is a 74 year old female with a history of lupus, DM, hypertension, CHF, CAD with PCI in 2014, and unspecified renal failure. She presents to the emergency department for further evaluation of headache. Patient states that she has had an aching headache which has been present over the past 3 months, but worsening over the past 2 weeks. She states it is present in her forehead and diagonally across the left side of her face. She has taken Tramadol without relief of symptoms. She denies any head injury or trauma. She states she has felt some associated nausea, but denies emesis. No new visual disturbances or hearing changes. No associated extremity numbness/weakness. No fever or neck stiffness. Patient reports that she felt prompted to call EMS this evening because she also felt lightheaded and short of breath. She states she always feels short of breath to some extent, but it felt a bit worse this evening. She denies syncope. No associated chest pain.  Patient reports that she lives alone in an apartment. She states she has been compliant with all of her medications.  Patient is a 74 y.o. female presenting with headaches. The history is provided by the patient. No language interpreter was used.  Headache Associated symptoms: nausea   Associated symptoms: no abdominal pain, no fever, no neck stiffness, no numbness, no photophobia and no vomiting     Past Medical History  Diagnosis Date  . Depression   . Lupus   . Asthma   . Diabetes mellitus   . Hypertension   . Hypothyroidism   . Congestive heart failure, unspecified   . Other and unspecified hyperlipidemia   . Unspecified vitamin D deficiency   .  Renal failure, unspecified   . History of oxygen administration     oxygen use 2l/m nasally during the night  . Cataracts, both eyes   . OSA (obstructive sleep apnea)     no cpap used   Past Surgical History  Procedure Laterality Date  . Tubal ligation    . Nasal sinus surgery    . Gallbladder surgery    . Carpal tunnel release Bilateral   . Ankle arthroscopy with open reduction internal fixation (orif) Right     2-3 yrs ago-retained screws  . Cholecystectomy    . Colonoscopy N/A 12/10/2013    Procedure: COLONOSCOPY;  Surgeon: Inda Castle, MD;  Location: [facility];  Service: Endoscopy;  Laterality: N/A;  . Left heart catheterization with coronary angiogram N/A 08/08/2012    Procedure: LEFT HEART CATHETERIZATION WITH CORONARY ANGIOGRAM;  Surgeon: [name], MD;  Location: [facility] CATH LAB;  Service: Cardiovascular;  Laterality: N/A;  . Percutaneous coronary stent intervention (pci-s) N/A 08/21/2012    Procedure: PERCUTANEOUS CORONARY STENT INTERVENTION (PCI-S);  Surgeon: [name], MD;  Location: [facility] CATH LAB;  Service: Cardiovascular;  Laterality: N/A;   Family History  Problem Relation Age of Onset  . Heart disease Mother   . Diabetes Mother   . Clotting disorder Mother   . Pneumonia Father   . Rheum arthritis Father   . Diabetes Sister   . Diabetes Sister   . Asthma Brother   . Colon cancer Neg Hx    History  Substance Use Topics  .  Smoking status: Former Smoker -- 3.00 packs/day for 20 years    Types: Cigarettes    Quit date: 07/06/1999  . Smokeless tobacco: Never Used  . Alcohol Use: No   OB History    No data available      Review of Systems  Constitutional: Negative for fever.  HENT: Negative for tinnitus (reports chronic tinnitus without worsening).   Eyes: Negative for photophobia and visual disturbance.  Respiratory: Positive for shortness of breath.   Cardiovascular: Negative for chest pain.  Gastrointestinal: Positive for nausea. Negative  for vomiting and abdominal pain.  Musculoskeletal: Negative for neck stiffness.  Neurological: Positive for light-headedness and headaches. Negative for syncope, weakness and numbness.  All other systems reviewed and are negative.   Allergies  Ace inhibitors; Pregabalin; Ropinirole hydrochloride; Amantadines; and Penicillins  Home Medications   Prior to Admission medications   Medication Sig Start Date End Date Taking? Authorizing Provider  aspirin EC 81 MG tablet Take 81 mg by mouth daily.   Yes Historical Provider, MD  atorvastatin (LIPITOR) 20 MG tablet Take 1 tablet (20 mg total) by mouth daily. 11/23/13  Yes Angelica Ran, MD  b complex vitamins capsule Take 1 capsule by mouth daily.   Yes Historical Provider, MD  cholecalciferol (VITAMIN D) 1000 UNITS tablet Take 1,000 Units by mouth daily.   Yes Historical Provider, MD  escitalopram (LEXAPRO) 20 MG tablet Take 1 tablet by mouth daily.    Yes Historical Provider, MD  gabapentin (NEURONTIN) 300 MG capsule Take 1 capsule (300 mg total) by mouth 2 (two) times daily. 03/12/14  Yes Hulen Luster, DO  insulin NPH Human (HUMULIN N,NOVOLIN N) 100 UNIT/ML injection Inject 30 Units into the skin every morning.   Yes Historical Provider, MD  isosorbide mononitrate (IMDUR) 60 MG 24 hr tablet Take 60 mg by mouth every morning.   Yes Historical Provider, MD  nitroGLYCERIN (NITROSTAT) 0.4 MG SL tablet Place 1 tablet (0.4 mg total) under the tongue every 5 (five) minutes as needed for chest pain. 10/02/13  Yes Zigmund Gottron, MD  pantoprazole (PROTONIX) 40 MG tablet Take 1 tablet (40 mg total) by mouth 2 (two) times daily. 11/30/13  Yes Zigmund Gottron, MD  spironolactone (ALDACTONE) 25 MG tablet Take 1 tablet (25 mg total) by mouth 2 (two) times daily. 10/02/13  Yes Zigmund Gottron, MD  SYNTHROID 50 MCG tablet take 1 tablet by mouth once daily 07/30/14  Yes Zigmund Gottron, MD  traMADol (ULTRAM) 50 MG tablet TAKE 1  TABLET BY MOUTH 3 TIMES DAILY 03/04/14  Yes Zigmund Gottron, MD  traZODone (DESYREL) 50 MG tablet Take 1 tablet (50 mg total) by mouth at bedtime as needed for sleep. 10/02/13  Yes Zigmund Gottron, MD  VITAMIN A PO Take 1 tablet by mouth daily.   Yes Historical Provider, MD  B-D INS SYRINGE 0.5CC/30GX1/2" 30G X 1/2" 0.5 ML MISC use as directed 11/02/10   Zigmund Gottron, MD  Biotin 5000 MCG TABS Take 5,000 mcg by mouth daily.    Historical Provider, MD  glucose blood test strip Check blood sugar three times per day. 11/09/13   Angelica Ran, MD  metoprolol succinate (TOPROL-XL) 25 MG 24 hr tablet Take 1 tablet by mouth daily.    Historical Provider, MD  Polyethyl Glycol-Propyl Glycol (SYSTANE OP) Place 1 drop into both eyes 4 (four) times daily.    Historical Provider, MD   BP 191/59 mmHg  Pulse 78  Temp(Src) 98.4 F (36.9 C) (Oral)  Resp 17  SpO2 97%   Physical Exam  Constitutional: She is oriented to person, place, and time. She appears well-developed and well-nourished. No distress.  Nontoxic/nonseptic appearing  HENT:  Head: Normocephalic and atraumatic.  Mouth/Throat: Oropharynx is clear and moist. No oropharyngeal exudate.  Uvula midline. Patient tolerating secretions without difficulty. Symmetric rise of the uvula with phonation. Dentition absent.  Eyes: Conjunctivae and EOM are normal. Pupils are equal, round, and reactive to light. No scleral icterus.  EOMs normal without nystagmus.  Neck: Normal range of motion.  No nuchal rigidity or meningismus. No JVD.  Cardiovascular: Normal rate, regular rhythm and intact distal pulses.   Distal radial pulses 2+ b/l  Pulmonary/Chest: Effort normal. No respiratory distress. She has no wheezes. She has no rales.  Patient his neck without tachypnea. Speaking in slightly truncated sentences. Breath sounds decreased in all lung fields. No wheezing, rales, or rhonchi. Chest expansion symmetric.  Musculoskeletal: Normal range  of motion.  Neurological: She is alert and oriented to person, place, and time. No cranial nerve deficit. She exhibits normal muscle tone. Coordination normal.  GCS 15. Speech is goal oriented. No focal neurologic deficits appreciated. No pronator drift. Equal grip strength and strength and resistance in all major muscle groups bilaterally.  Skin: Skin is warm and dry. No rash noted. She is not diaphoretic. No erythema. No pallor.  Psychiatric: She has a normal mood and affect. Her behavior is normal.  Nursing note and vitals reviewed.   ED Course  Procedures (including critical care time) Labs Review Labs Reviewed  CBC WITH DIFFERENTIAL/PLATELET - Abnormal; Notable for the following:    Hemoglobin 10.6 (*)    MCH 24.9 (*)    MCHC 26.8 (*)    All other components within normal limits  BASIC METABOLIC PANEL - Abnormal; Notable for the following:    Glucose, Bld 131 (*)    Creatinine, Ser 1.25 (*)    GFR calc non Af Amer 41 (*)    GFR calc Af Amer 48 (*)    All other components within normal limits  URINALYSIS, ROUTINE W REFLEX MICROSCOPIC - Abnormal; Notable for the following:    APPearance CLOUDY (*)    Protein, ur 30 (*)    All other components within normal limits  SEDIMENTATION RATE - Abnormal; Notable for the following:    Sed Rate 52 (*)    All other components within normal limits  CBG MONITORING, ED - Abnormal; Notable for the following:    Glucose-Capillary 121 (*)    All other components within normal limits  PROTIME-INR  TROPONIN I  BRAIN NATRIURETIC PEPTIDE  URINE MICROSCOPIC-ADD ON    Imaging Review Dg Chest 2 View  08/01/2014   CLINICAL DATA:  Headache, shortness of breath, smoker.  EXAM: CHEST  2 VIEW  COMPARISON:  01/10/2006  FINDINGS: Cardiac enlargement with mild prominence of pulmonary vascularity. No edema or consolidation in the lungs. Central interstitial changes likely representing of chronic bronchitis. No blunting of costophrenic angles. No  pneumothorax. Calcified and tortuous aorta. Degenerative changes in the shoulders. Surgical clips in the right upper quadrant.  IMPRESSION: Cardiac enlargement with mild pulmonary vascular congestion. No edema or consolidation. Chronic bronchitic changes.   Electronically Signed   By: Lucienne Capers M.D.   On: 08/01/2014 22:25   Ct Head Wo Contrast  08/01/2014   CLINICAL DATA:  Initial evaluation for several month history of headache.  EXAM: CT HEAD WITHOUT CONTRAST  TECHNIQUE: Contiguous axial images were obtained from the base of the skull through the vertex without intravenous contrast.  COMPARISON:  Prior study from 06/24/2004.  FINDINGS: Mild diffuse prominence of the CSF containing spaces is compatible with generalized cerebral atrophy. Patchy hypodensity within the periventricular and deep white matter of both cerebral hemispheres most consistent with chronic small vessel ischemic disease.  No acute large vessel territory infarct. No mass lesion or midline shift. Ventricles normal in size without evidence of hydrocephalus. No extra-axial fluid collection.  Calvarium intact. No scalp soft tissue abnormality identified. No acute abnormality seen about the orbits.  Sequela of chronic right maxillary sinus disease with probable prior Caldwell-Luc procedure noted. Paranasal sinuses are otherwise clear. No mastoid effusion.  IMPRESSION: 1. No acute intracranial process. 2. Mild atrophy with chronic microvascular ischemic disease. 3. Sequelae of chronic right maxillary sinus disease with prior sinus surgery.   Electronically Signed   By: Jeannine Boga M.D.   On: 08/01/2014 22:46   US Biopsy  07/31/2014   CLINICAL DATA:  74 year old female with a history of multiple thyroid nodules. She has been referred for biopsy of left-sided nodules.  EXAM: ULTRASOUND GUIDED FINE-NEEDLE ASPIRATE/ BIOPSY OF 2 LEFT-SIDED THYROID NODULES  MEDICATIONS: None  PROCEDURE: The procedure, risks, benefits, and alternatives  were explained to the patient. Questions regarding the procedure were encouraged and answered. The patient understands and consents to the procedure.  Ultrasound survey was performed with images stored and sent to PACs.  The left neck was prepped with Betadine in a sterile fashion, and a sterile drape was applied covering the operative field. A sterile gown and sterile gloves were used for the procedure. Local anesthesia was provided with 1% Lidocaine.  Left superior nodule: Ultrasound guidance was used to infiltrate the region with 1% lidocaine for local anesthesia. Three separate 25 gauge fine needle biopsy were then acquired of the left superior nodule using ultrasound guidance. Images were stored.  Left mid nodule: Ultrasound guidance was used to infiltrate the region with 1% lidocaine for local anesthesia. Three separate 25 gauge fine needle biopsy were then acquired of the left mid thyroid nodule using ultrasound guidance. Images were stored.  Slide preparation was performed.  Final image was stored after biopsy.  Patient tolerated the procedure well and remained hemodynamically stable throughout.  No complications were encountered and no significant blood loss was encountered  COMPLICATIONS: None.  FINDINGS: Ultrasound survey of the left thyroid demonstrates heterogeneous echotexture and echo contour. Nodules targeted for biopsy were left superior and left mid.  Images during the case demonstrate needle tip within the nodule on each needle pass.  Final image demonstrates no complicating features.  IMPRESSION: Status post ultrasound-guided biopsy of left superior and left mid (medial) thyroid nodules. Tissue specimen sent to pathology for complete histopathologic analysis.  Signed,  Dulcy Fanny. Earleen Newport, DO  [facility]  [facility]   Electronically Signed   By: Corrie Mckusick D.O.   On: 07/31/2014 17:36     EKG Interpretation   Date/Time:  Thursday August 01 2014 21:53:21 EST Ventricular Rate:  87 PR Interval:  217 QRS Duration: 101 QT Interval:  550 QTC Calculation: 662 R Axis:   1 Text Interpretation:  Sinus rhythm Ventricular premature complex  Borderline prolonged PR interval Anterior infarct, old Minimal ST  depression, inferior leads Prolonged QT interval Baseline wander in  lead(s) V1 V2 V3 poor baseline, grossly unchanged from previous.   Confirmed by Darl Householder  MD, DAVID (  [id number]) on 08/01/2014 10:04:35 PM      MDM   Final diagnoses:  Acute nonintractable headache, unspecified headache type  Hypertensive urgency  Elevated sedimentation rate  Hx of systemic lupus erythematosus (SLE)    74 year old female presents to the emergency department for further evaluation of headache 3 weeks. Most recent visit with primary care physician 2 weeks ago makes no mention of headache complaints. Most recent neurology follow-up at the beginning of this month also does not mention such symptoms. Patient is a poor historian. Possible that headache is more acute than patient is leaving on. Patient was found to be hypertensive on arrival to 202/74. This has persisted over ED course despite treatment with Lopressor and migraine cocktail. CT head is unremarkable. No focal neurologic deficits noted on exam.  Patient does have a history of lupus. Her sedimentation rate is elevated today to 54. Most likely differentials for headache includes temporal arteritis vs hypertensive urgency. Have consulted with Family Medicine service today for admission.   Filed Vitals:   08/01/14 2240 08/01/14 2300 08/01/14 2330 08/02/14 0000  BP:  193/75 191/80 191/59  Pulse: 72 74 78 78  Temp:      TempSrc:      Resp: 24 20 17    SpO2: 100% 98% 98% 97%       Antonietta Breach, PA-C 08/02/14 0016  Wandra Arthurs, MD 08/02/14 [phone]

## 2014-08-01 NOTE — ED Notes (Signed)
Pt is aware urine is needed, pt is unable to urinate at this time.

## 2014-08-01 NOTE — ED Notes (Signed)
Per EMS: pt with headache x 3 weeks with nausea.  Takes tramadol for pain without any relief from headache. CBG 146.

## 2014-08-02 ENCOUNTER — Telehealth: Payer: Self-pay | Admitting: Family Medicine

## 2014-08-02 DIAGNOSIS — I160 Hypertensive urgency: Secondary | ICD-10-CM | POA: Diagnosis present

## 2014-08-02 DIAGNOSIS — Z862 Personal history of diseases of the blood and blood-forming organs and certain disorders involving the immune mechanism: Secondary | ICD-10-CM

## 2014-08-02 DIAGNOSIS — R519 Headache, unspecified: Secondary | ICD-10-CM | POA: Insufficient documentation

## 2014-08-02 DIAGNOSIS — I10 Essential (primary) hypertension: Secondary | ICD-10-CM | POA: Diagnosis not present

## 2014-08-02 DIAGNOSIS — R52 Pain, unspecified: Secondary | ICD-10-CM

## 2014-08-02 DIAGNOSIS — N182 Chronic kidney disease, stage 2 (mild): Secondary | ICD-10-CM | POA: Diagnosis not present

## 2014-08-02 DIAGNOSIS — I129 Hypertensive chronic kidney disease with stage 1 through stage 4 chronic kidney disease, or unspecified chronic kidney disease: Secondary | ICD-10-CM | POA: Diagnosis not present

## 2014-08-02 DIAGNOSIS — R51 Headache: Secondary | ICD-10-CM

## 2014-08-02 DIAGNOSIS — R700 Elevated erythrocyte sedimentation rate: Secondary | ICD-10-CM | POA: Diagnosis not present

## 2014-08-02 DIAGNOSIS — I161 Hypertensive emergency: Secondary | ICD-10-CM | POA: Diagnosis present

## 2014-08-02 DIAGNOSIS — N189 Chronic kidney disease, unspecified: Secondary | ICD-10-CM | POA: Diagnosis not present

## 2014-08-02 DIAGNOSIS — M329 Systemic lupus erythematosus, unspecified: Secondary | ICD-10-CM | POA: Insufficient documentation

## 2014-08-02 DIAGNOSIS — E875 Hyperkalemia: Secondary | ICD-10-CM | POA: Diagnosis not present

## 2014-08-02 LAB — BASIC METABOLIC PANEL
Anion gap: 7 (ref 5–15)
Anion gap: 7 (ref 5–15)
Anion gap: 4 — ABNORMAL LOW (ref 5–15)
BUN: 12 mg/dL (ref 6–23)
BUN: 16 mg/dL (ref 6–23)
BUN: 19 mg/dL (ref 6–23)
CO2: 22 mmol/L (ref 19–32)
CO2: 21 mmol/L (ref 19–32)
CO2: 26 mmol/L (ref 19–32)
Calcium: 9.4 mg/dL (ref 8.4–10.5)
Calcium: 9.6 mg/dL (ref 8.4–10.5)
Calcium: 8.9 mg/dL (ref 8.4–10.5)
Chloride: 108 mmol/L (ref 96–112)
Chloride: 110 mmol/L (ref 96–112)
Chloride: 107 mmol/L (ref 96–112)
Creatinine, Ser: 1.16 mg/dL — ABNORMAL HIGH (ref 0.50–1.10)
Creatinine, Ser: 1.47 mg/dL — ABNORMAL HIGH (ref 0.50–1.10)
Creatinine, Ser: 1.48 mg/dL — ABNORMAL HIGH (ref 0.50–1.10)
GFR calc Af Amer: 52 mL/min — ABNORMAL LOW (ref >90)
GFR calc Af Amer: 39 mL/min — ABNORMAL LOW (ref >90)
GFR calc Af Amer: 39 mL/min — ABNORMAL LOW (ref >90)
GFR calc non Af Amer: 45 mL/min — ABNORMAL LOW (ref >90)
GFR calc non Af Amer: 34 mL/min — ABNORMAL LOW (ref >90)
GFR calc non Af Amer: 34 mL/min — ABNORMAL LOW (ref >90)
Glucose, Bld: 199 mg/dL — ABNORMAL HIGH (ref 70–99)
Glucose, Bld: 221 mg/dL — ABNORMAL HIGH (ref 70–99)
Glucose, Bld: 203 mg/dL — ABNORMAL HIGH (ref 70–99)
Potassium: 5.6 mmol/L — ABNORMAL HIGH (ref 3.5–5.1)
Potassium: 5.9 mmol/L — ABNORMAL HIGH (ref 3.5–5.1)
Potassium: 5.3 mmol/L — ABNORMAL HIGH (ref 3.5–5.1)
Sodium: 137 mmol/L (ref 135–145)
Sodium: 138 mmol/L (ref 135–145)
Sodium: 137 mmol/L (ref 135–145)

## 2014-08-02 LAB — TROPONIN I
Troponin I: 0.09 ng/mL — ABNORMAL HIGH (ref <0.031)
Troponin I: 0.05 ng/mL — ABNORMAL HIGH (ref <0.031)
Troponin I: 0.05 ng/mL — ABNORMAL HIGH (ref <0.031)

## 2014-08-02 LAB — MAGNESIUM: Magnesium: 1.5 mg/dL (ref 1.5–2.5)

## 2014-08-02 LAB — GLUCOSE, CAPILLARY
Glucose-Capillary: 193 mg/dL — ABNORMAL HIGH (ref 70–99)
Glucose-Capillary: 201 mg/dL — ABNORMAL HIGH (ref 70–99)
Glucose-Capillary: 245 mg/dL — ABNORMAL HIGH (ref 70–99)
Glucose-Capillary: 203 mg/dL — ABNORMAL HIGH (ref 70–99)
Glucose-Capillary: 203 mg/dL — ABNORMAL HIGH (ref 70–99)

## 2014-08-02 LAB — CBC
HCT: 32.7 % — ABNORMAL LOW (ref 36.0–46.0)
Hemoglobin: 11.3 g/dL — ABNORMAL LOW (ref 12.0–15.0)
MCH: 24.7 pg — ABNORMAL LOW (ref 26.0–34.0)
MCHC: 34.6 g/dL (ref 30.0–36.0)
MCV: 71.4 fL — ABNORMAL LOW (ref 78.0–100.0)
Platelets: 217 10*3/uL (ref 150–400)
RBC: 4.58 MIL/uL (ref 3.87–5.11)
RDW: 14.2 % (ref 11.5–15.5)
WBC: 12 10*3/uL — ABNORMAL HIGH (ref 4.0–10.5)

## 2014-08-02 MED ORDER — NITROGLYCERIN 0.4 MG SL SUBL: 0.4000 mg | SUBLINGUAL_TABLET | SUBLINGUAL | Status: DC | PRN

## 2014-08-02 MED ORDER — HYDROCODONE-ACETAMINOPHEN 5-325 MG PO TABS
1.0000 | ORAL_TABLET | Freq: Four times a day (QID) | ORAL | Status: DC | PRN
  Administered 2014-08-02 – 2014-08-04 (×2): 1 via ORAL
  Filled 2014-08-02 (×2): qty 1

## 2014-08-02 MED ORDER — SODIUM POLYSTYRENE SULFONATE 15 GM/60ML PO SUSP
60.0000 g | Freq: Once | ORAL | Status: AC
  Administered 2014-08-02: 60 g via ORAL
  Filled 2014-08-02: qty 240

## 2014-08-02 MED ORDER — INSULIN GLARGINE 100 UNIT/ML ~~LOC~~ SOLN
20.0000 [IU] | Freq: Every day | SUBCUTANEOUS | Status: DC
  Administered 2014-08-02 – 2014-08-03 (×2): 20 [IU] via SUBCUTANEOUS
  Filled 2014-08-02 (×3): qty 0.2

## 2014-08-02 MED ORDER — CETYLPYRIDINIUM CHLORIDE 0.05 % MT LIQD
7.0000 mL | Freq: Two times a day (BID) | OROMUCOSAL | Status: DC
  Administered 2014-08-02 – 2014-08-04 (×5): 7 mL via OROMUCOSAL

## 2014-08-02 MED ORDER — LEVOTHYROXINE SODIUM 50 MCG PO TABS
50.0000 ug | ORAL_TABLET | Freq: Every day | ORAL | Status: DC
  Administered 2014-08-02 – 2014-08-04 (×3): 50 ug via ORAL
  Filled 2014-08-02 (×4): qty 1

## 2014-08-02 MED ORDER — ASPIRIN EC 81 MG PO TBEC
81.0000 mg | DELAYED_RELEASE_TABLET | Freq: Every day | ORAL | Status: DC
  Administered 2014-08-02 – 2014-08-04 (×3): 81 mg via ORAL
  Filled 2014-08-02 (×3): qty 1

## 2014-08-02 MED ORDER — SODIUM CHLORIDE 0.9 % IJ SOLN
3.0000 mL | Freq: Two times a day (BID) | INTRAMUSCULAR | Status: DC
  Administered 2014-08-02 (×3): 3 mL via INTRAVENOUS

## 2014-08-02 MED ORDER — METOPROLOL SUCCINATE ER 25 MG PO TB24
25.0000 mg | ORAL_TABLET | Freq: Every day | ORAL | Status: DC
  Filled 2014-08-02: qty 1

## 2014-08-02 MED ORDER — ACETAMINOPHEN 650 MG RE SUPP: 650.0000 mg | Freq: Four times a day (QID) | RECTAL | Status: DC | PRN

## 2014-08-02 MED ORDER — ESCITALOPRAM OXALATE 20 MG PO TABS
20.0000 mg | ORAL_TABLET | Freq: Every day | ORAL | Status: DC
  Administered 2014-08-02 – 2014-08-04 (×3): 20 mg via ORAL
  Filled 2014-08-02 (×3): qty 1

## 2014-08-02 MED ORDER — ATORVASTATIN CALCIUM 20 MG PO TABS
20.0000 mg | ORAL_TABLET | Freq: Every day | ORAL | Status: DC
  Administered 2014-08-02 – 2014-08-04 (×3): 20 mg via ORAL
  Filled 2014-08-02 (×3): qty 1

## 2014-08-02 MED ORDER — ISOSORBIDE MONONITRATE ER 60 MG PO TB24
60.0000 mg | ORAL_TABLET | Freq: Every morning | ORAL | Status: DC
  Administered 2014-08-02 – 2014-08-04 (×3): 60 mg via ORAL
  Filled 2014-08-02 (×3): qty 1

## 2014-08-02 MED ORDER — HEPARIN SODIUM (PORCINE) 5000 UNIT/ML IJ SOLN
5000.0000 [IU] | Freq: Three times a day (TID) | INTRAMUSCULAR | Status: DC
  Administered 2014-08-02 – 2014-08-04 (×9): 5000 [IU] via SUBCUTANEOUS
  Filled 2014-08-02 (×9): qty 1

## 2014-08-02 MED ORDER — TRAMADOL HCL 50 MG PO TABS
50.0000 mg | ORAL_TABLET | Freq: Three times a day (TID) | ORAL | Status: DC
  Administered 2014-08-02 – 2014-08-04 (×7): 50 mg via ORAL
  Filled 2014-08-02 (×8): qty 1

## 2014-08-02 MED ORDER — INSULIN ASPART 100 UNIT/ML ~~LOC~~ SOLN
0.0000 [IU] | Freq: Three times a day (TID) | SUBCUTANEOUS | Status: DC
  Administered 2014-08-02 (×3): 3 [IU] via SUBCUTANEOUS
  Administered 2014-08-03: 2 [IU] via SUBCUTANEOUS
  Administered 2014-08-03 – 2014-08-04 (×3): 1 [IU] via SUBCUTANEOUS

## 2014-08-02 MED ORDER — SODIUM CHLORIDE 0.9 % IV SOLN
1.0000 g | Freq: Once | INTRAVENOUS | Status: AC
  Administered 2014-08-02: 1 g via INTRAVENOUS
  Filled 2014-08-02: qty 10

## 2014-08-02 MED ORDER — PANTOPRAZOLE SODIUM 40 MG PO TBEC
40.0000 mg | DELAYED_RELEASE_TABLET | Freq: Two times a day (BID) | ORAL | Status: DC
  Administered 2014-08-02 – 2014-08-04 (×6): 40 mg via ORAL
  Filled 2014-08-02 (×6): qty 1

## 2014-08-02 MED ORDER — TRAZODONE HCL 50 MG PO TABS
50.0000 mg | ORAL_TABLET | Freq: Every evening | ORAL | Status: DC | PRN
  Administered 2014-08-02 – 2014-08-03 (×2): 50 mg via ORAL
  Filled 2014-08-02 (×3): qty 1

## 2014-08-02 MED ORDER — HYDRALAZINE HCL 20 MG/ML IJ SOLN
10.0000 mg | Freq: Three times a day (TID) | INTRAMUSCULAR | Status: DC | PRN
  Administered 2014-08-02: 10 mg via INTRAVENOUS
  Filled 2014-08-02: qty 1

## 2014-08-02 MED ORDER — SPIRONOLACTONE 25 MG PO TABS
25.0000 mg | ORAL_TABLET | Freq: Two times a day (BID) | ORAL | Status: DC
  Filled 2014-08-02 (×3): qty 1

## 2014-08-02 MED ORDER — SODIUM CHLORIDE 0.9 % IJ SOLN: 3.0000 mL | INTRAMUSCULAR | Status: DC | PRN

## 2014-08-02 MED ORDER — ACETAMINOPHEN 325 MG PO TABS
650.0000 mg | ORAL_TABLET | Freq: Four times a day (QID) | ORAL | Status: DC | PRN
  Administered 2014-08-03: 650 mg via ORAL
  Filled 2014-08-02: qty 2

## 2014-08-02 MED ORDER — METOPROLOL TARTRATE 12.5 MG HALF TABLET
12.5000 mg | ORAL_TABLET | Freq: Two times a day (BID) | ORAL | Status: DC
  Administered 2014-08-02 – 2014-08-03 (×3): 12.5 mg via ORAL
  Filled 2014-08-02 (×4): qty 1

## 2014-08-02 MED ORDER — SODIUM CHLORIDE 0.9 % IV SOLN: 250.0000 mL | INTRAVENOUS | Status: DC | PRN

## 2014-08-02 MED ORDER — GABAPENTIN 300 MG PO CAPS
300.0000 mg | ORAL_CAPSULE | Freq: Two times a day (BID) | ORAL | Status: DC
  Administered 2014-08-02 – 2014-08-04 (×6): 300 mg via ORAL
  Filled 2014-08-02 (×8): qty 1

## 2014-08-02 MED ORDER — SODIUM POLYSTYRENE SULFONATE 15 GM/60ML PO SUSP
30.0000 g | Freq: Once | ORAL | Status: AC
  Administered 2014-08-02: 30 g via ORAL
  Filled 2014-08-02 (×2): qty 120

## 2014-08-02 MED ORDER — SODIUM CHLORIDE 0.9 % IJ SOLN
3.0000 mL | Freq: Two times a day (BID) | INTRAMUSCULAR | Status: DC
  Administered 2014-08-02 – 2014-08-04 (×4): 3 mL via INTRAVENOUS

## 2014-08-02 NOTE — Progress Notes (Signed)
New Admission Note:   Arrival Method:  Via stretcher Mental Orientation: A&Ox4 Telemetry: n/a Assessment: Completed Skin: intact IV: NSL Pain: HEADACHE 3 OUT OF 10 Tubes:N/A Safety Measures: Safety Fall Prevention Plan has been given, discussed and signed Admission: Completed [address] Orientation: Patient has been orientated to the room, unit and staff.  Family:NONE  Orders have been reviewed and implemented. Will continue to monitor the patient. Call light has been placed within reach and bed alarm has been activated.   Dudley Major BSN, Consulting civil engineer number: [phone]

## 2014-08-02 NOTE — Telephone Encounter (Signed)
Pt called and wanted the doctor to know that she is in the Hospital for high BP room 6E23/ jw

## 2014-08-02 NOTE — Telephone Encounter (Signed)
Paid social visit to patient in hospital

## 2014-08-02 NOTE — ED Notes (Signed)
Internal MD at bedside.

## 2014-08-02 NOTE — H&P (Signed)
[facility] Admission History and Physical Service Pager: [phone]  Patient name: Brittany Roberts Medical record number: [id number] Date of birth: 1940-11-08 Age: 74 y.o. Gender: female  Primary Care Provider: Zigmund Gottron, MD Consultants: None Code Status: Full per discussion on admission.  Chief Complaint: Headache, HTN Emergency  Assessment and Plan: Brittany SERMENO is a 74 y.o. female presenting with headache . PMH is significant for lupus, DM, HTN, diastolic CHF, Hypothyroidism, Depression, CAD with PCI in 2014, PMR, generalized pain, COPD, and unspecified renal failure with cystic kidneys  # Headache- Improving, suspected to be related to elevated blood pressures. Head CT showed no acute intracranial process with mild atrophy due to chronic microvascular ischemic disease. Consider Temporal Arteritis in ddx due to location, elevated ESR 52, and history of polymyalgia rheumatica. Solumedrol given in ED. Neurological exam normal. Chronic but worsening from 6 to 10/10 was somewhat abrupt, though does not sound from description like "thunderclap" of SAH. - Consider prednisone daily for possible temporal arteritis. Consider biopsy. - Tylenol PRN mild pain, Norco PRN moderate pain - If any neurological abnormalities, could consider MRI.  # Hypertension- Hypertensive urgency with Blood pressure elevated to 202/74 at admission with no end-organ findings (other than headache), trop neg x 1. Home medications include Metoprolol Succinate 39m, Aldactone 227m suspect noncompliance playing a role. - Will aim to lower BP slowly by restarting home meds she has not been taking (metoprolol and imdur) and continuing aldactone. - Monitor on tele - Continue home medication. Confusion with home medications noted in previous office visit notes. Clarify medications and consider home help with taking from family who can visit vs home health nurse - need to discuss with pt. -  Hydralazine PRN SBP>200 or DBP >110 - Follow up Orthostatic vital signs given report of dizziness - Follow up AM EKG - Follow troponins  # Diabetes -. Last A1C 5.8 on 07/19/14. - Sensitive Sliding Scale, Lantus 20Units (decreased from home 30 initially) - Monitor amount of sliding scale required and adjust Lantus dose as indicated  # Hypothyroidism- Home medication Levothyroxine 5040m Last TSH 1.049 on 07/19/14. - Continue home medication  # Depression- Home medication Lexapro 93m58m Insomnia- Home medication Trazodone 50mg37mN/GI: Soft Diet (left dentures at home) Prophylaxis: SQ heparin  [facility]: [name]charge pending improvement of headache and BP.  History of Present Illness: Brittany SURENA Roberts 73 y.64 female presenting with one year history of headache with nausea worse over the past two days. Has been taking Tramadol for pain without relief. Pain is located in her forehead and diagonally across the left side of her face. Denies any history of trauma or injury. Felt lightheaded and short of breath today, which prompted her to call EMS. Headache has improved since presenting to hospital and is almost resolved. Also note mild cough. Denies fevers. Denies chest pain, but has chronic dyspnea. Denies numbness, tingling, blurred vision, aphasia, or gait difficulty. Usually uses walker for ambulation. Denies changes in hearing.   Also notes that when she was walking to bathroom it felt like she was going to pass out. Felt like someone had their hand around her throat and was choking her. Unsure how long this feeling lasted. Denies vertigo or lightheadedness. Denies syncope. Had thyroid biopsy yesterday for multiple nodules, which she states went well however she feels that her headache worsened after the procedure.  Has history of hypertension. States she takes Imdur and Aldactone for  her blood pressure. Was out of Imdur for a few months because her  pharmacy wouldn't give it to her, but states she started retaking it a few days ago. Has not been taking Metoprolol since she didn't know she was on it. Does not normally check her blood pressure at home.   In the ED, she was given solumedrol, reglan, benadryl, compazine, and metoprolol.  Review Of Systems: Per HPI  Otherwise 12 point review of systems was performed and was unremarkable.  Patient Active Problem List   Diagnosis Date Noted  . Thyroid mass 04/26/2014  . Acute renal failure superimposed on stage 3 chronic kidney disease 04/26/2014  . Dyspnea 04/15/2014  . Dry skin 04/06/2014  . Hyperkalemia 04/05/2014  . Abdominal pain, right lower quadrant 02/20/2014  . Unspecified constipation 12/10/2013  . Benign neoplasm of colon 12/10/2013  . Controlled type 2 diabetes with neuropathy 11/27/2013  . Cystic kidney disease 11/09/2013  . Generalized pain 11/09/2013  . Insulin dependent type 2 diabetes mellitus 10/29/2013  . Abdominal pain, unspecified site 09/27/2013  . Hoarseness or changing voice 09/27/2013  . Dyskinesia, tardive 07/11/2013  . Polymyalgia rheumatica 05/30/2013  . TMJ syndrome 02/16/2013  . Angina pectoris associated with type 2 diabetes mellitus 08/08/2012  . CAD in native artery 08/08/2012  . Asthma, chronic, PRESUMED wiht MULTIFACTORIAL DYSPNEA 05/30/2012  . Insomnia 09/22/2011  . Mobility poor 02/02/2011  . CHRONIC KIDNEY DISEASE STAGE II (MILD) 05/13/2010  . CHRONIC DIASTOLIC HEART FAILURE 00/86/7619  . COPD (chronic obstructive pulmonary disease) 03/26/2010  . OBESITY 02/27/2010  . DIABETIC NEUROPATHY, TYPE 2 04/25/2009  . OSTEOARTHRITIS 04/25/2009  . ANEMIA NOS 03/11/2007  . COGNITIVE IMPAIRMENT, MILD, SO STATED 03/11/2007  . RESTLESS LEG SYNDROME 03/11/2007  . LUPUS ERYTHEMATOSUS, DISCOID 03/11/2007  . Hypothyroidism 03/07/2007  . DYSLIPIDEMIA 03/07/2007  . DEPRESSION 03/07/2007  . Essential hypertension 03/07/2007  . Coronary atherosclerosis  03/07/2007  . GERD 03/07/2007  . SLEEP APNEA 03/07/2007   Past Medical History: Past Medical History  Diagnosis Date  . Depression   . Lupus   . Asthma   . Diabetes mellitus   . Hypertension   . Hypothyroidism   . Congestive heart failure, unspecified   . Other and unspecified hyperlipidemia   . Unspecified vitamin D deficiency   . Renal failure, unspecified   . History of oxygen administration     oxygen use 2l/m nasally during the night  . Cataracts, both eyes   . OSA (obstructive sleep apnea)     no cpap used   Past Surgical History: Past Surgical History  Procedure Laterality Date  . Tubal ligation    . Nasal sinus surgery    . Gallbladder surgery    . Carpal tunnel release Bilateral   . Ankle arthroscopy with open reduction internal fixation (orif) Right     2-3 yrs ago-retained screws  . Cholecystectomy    . Colonoscopy N/A 12/10/2013    Procedure: COLONOSCOPY;  Surgeon: Inda Castle, MD;  Location: [facility] ENDOSCOPY;  Service: Endoscopy;  Laterality: N/A;  . Left heart catheterization with coronary angiogram N/A 08/08/2012    Procedure: LEFT HEART CATHETERIZATION WITH CORONARY ANGIOGRAM;  Surgeon: [name], MD;  Location: [facility] CATH LAB;  Service: Cardiovascular;  Laterality: N/A;  . Percutaneous coronary stent intervention (pci-s) N/A 08/21/2012    Procedure: PERCUTANEOUS CORONARY STENT INTERVENTION (PCI-S);  Surgeon: [name], MD;  Location: [facility] CATH LAB;  Service: Cardiovascular;  Laterality: N/A;   Social History: History  Substance Use Topics  . Smoking status: Former Smoker -- 3.00 packs/day for 20 years    Types: Cigarettes    Quit date: 07/06/1999  . Smokeless tobacco: Never Used  . Alcohol Use: No   Past smoker - quit >20 years ago after smoking 30-40 years Lives alone and feels she is capable of managing own meds  Please also refer to relevant sections of EMR.  Family History: Family History  Problem Relation Age of Onset  . Heart  disease Mother   . Diabetes Mother   . Clotting disorder Mother   . Pneumonia Father   . Rheum arthritis Father   . Diabetes Sister   . Diabetes Sister   . Asthma Brother   . Colon cancer Neg Hx    Allergies and Medications: Allergies  Allergen Reactions  . Ace Inhibitors     Coincided with sig bump in creat  . Pregabalin     REACTION: swelling  . Ropinirole Hydrochloride     REACTION: swelling  . Amantadines Rash  . Penicillins Swelling and Rash   No current facility-administered medications on file prior to encounter.   Current Outpatient Prescriptions on File Prior to Encounter  Medication Sig Dispense Refill  . aspirin EC 81 MG tablet Take 81 mg by mouth daily.    Marland Kitchen atorvastatin (LIPITOR) 20 MG tablet Take 1 tablet (20 mg total) by mouth daily. 90 tablet 3  . b complex vitamins capsule Take 1 capsule by mouth daily.    . cholecalciferol (VITAMIN D) 1000 UNITS tablet Take 1,000 Units by mouth daily.    Marland Kitchen escitalopram (LEXAPRO) 20 MG tablet Take 1 tablet by mouth daily.     Marland Kitchen gabapentin (NEURONTIN) 300 MG capsule Take 1 capsule (300 mg total) by mouth 2 (two) times daily. 60 capsule 11  . insulin NPH Human (HUMULIN N,NOVOLIN N) 100 UNIT/ML injection Inject 30 Units into the skin every morning.    . isosorbide mononitrate (IMDUR) 60 MG 24 hr tablet Take 60 mg by mouth every morning.    . nitroGLYCERIN (NITROSTAT) 0.4 MG SL tablet Place 1 tablet (0.4 mg total) under the tongue every 5 (five) minutes as needed for chest pain. 20 tablet 12  . pantoprazole (PROTONIX) 40 MG tablet Take 1 tablet (40 mg total) by mouth 2 (two) times daily. 180 tablet 12  . spironolactone (ALDACTONE) 25 MG tablet Take 1 tablet (25 mg total) by mouth 2 (two) times daily. 180 tablet 3  . SYNTHROID 50 MCG tablet take 1 tablet by mouth once daily 90 tablet 3  . traMADol (ULTRAM) 50 MG tablet TAKE 1 TABLET BY MOUTH 3 TIMES DAILY 90 tablet 5  . traZODone (DESYREL) 50 MG tablet Take 1 tablet (50 mg total) by  mouth at bedtime as needed for sleep. 90 tablet 3  . VITAMIN A PO Take 1 tablet by mouth daily.    . B-D INS SYRINGE 0.5CC/30GX1/2" 30G X 1/2" 0.5 ML MISC use as directed 100 each PRN  . Biotin 5000 MCG TABS Take 5,000 mcg by mouth daily.    Marland Kitchen glucose blood test strip Check blood sugar three times per day. 300 each 10  . metoprolol succinate (TOPROL-XL) 25 MG 24 hr tablet Take 1 tablet by mouth daily.    Vladimir Faster Glycol-Propyl Glycol (SYSTANE OP) Place 1 drop into both eyes 4 (four) times daily.      Objective: BP 191/59 mmHg  Pulse 78  Temp(Src) 98.4 F (36.9 C) (Oral)  Resp 17  SpO2 97% Exam: General: 74yo female resting comfortably in NAD HEENT: AT/[place], no teeth, EOMI, PERRL, o/p clear, neck supple, small pinpoint scabs from thyroid biopsy, no obvious masses, MMM; no deformity, erythema, tenderness, or swelling along forehead, right temple, or left temple Cardiovascular: RRR, no m/r/g, distant, 2+ B DP pulses Respiratory: CTAB with fine crackles in bases, no increased work of breathing. Abdomen: obese, soft and nondistended. Bowel sounds noted. No tenderness to palpation. Extremities: No LE edema; tender bilateral shins Skin: No rashes noted. Warm. Well perfused. Neuro: CN II-XII tested and intact. AAO. No focal deficits noted.  Labs and Imaging: CBC BMET   Recent Labs Lab 08/01/14 2148  WBC 10.3  HGB 10.6*  HCT 39.6  PLT 189    Recent Labs Lab 08/01/14 2148  NA 136  K 5.1  CL 107  CO2 24  BUN 13  CREATININE 1.25*  GLUCOSE 131*  CALCIUM 9.1    - Troponin negative - BNP 66.7 - ESR 52  Dg Chest 2 View  08/01/2014   CLINICAL DATA:  Headache, shortness of breath, smoker.  EXAM: CHEST  2 VIEW  COMPARISON:  01/10/2006  FINDINGS: Cardiac enlargement with mild prominence of pulmonary vascularity. No edema or consolidation in the lungs. Central interstitial changes likely representing of chronic bronchitis. No blunting of costophrenic angles. No pneumothorax.  Calcified and tortuous aorta. Degenerative changes in the shoulders. Surgical clips in the right upper quadrant.  IMPRESSION: Cardiac enlargement with mild pulmonary vascular congestion. No edema or consolidation. Chronic bronchitic changes.   Electronically Signed   By: Lucienne Capers M.D.   On: 08/01/2014 22:25   Ct Head Wo Contrast  08/01/2014   CLINICAL DATA:  Initial evaluation for several month history of headache.  EXAM: CT HEAD WITHOUT CONTRAST  TECHNIQUE: Contiguous axial images were obtained from the base of the skull through the vertex without intravenous contrast.  COMPARISON:  Prior study from 06/24/2004.  FINDINGS: Mild diffuse prominence of the CSF containing spaces is compatible with generalized cerebral atrophy. Patchy hypodensity within the periventricular and deep white matter of both cerebral hemispheres most consistent with chronic small vessel ischemic disease.  No acute large vessel territory infarct. No mass lesion or midline shift. Ventricles normal in size without evidence of hydrocephalus. No extra-axial fluid collection.  Calvarium intact. No scalp soft tissue abnormality identified. No acute abnormality seen about the orbits.  Sequela of chronic right maxillary sinus disease with probable prior Caldwell-Luc procedure noted. Paranasal sinuses are otherwise clear. No mastoid effusion.  IMPRESSION: 1. No acute intracranial process. 2. Mild atrophy with chronic microvascular ischemic disease. 3. Sequelae of chronic right maxillary sinus disease with prior sinus surgery.   Electronically Signed   By: Jeannine Boga M.D.   On: 08/01/2014 22:46   Lorna Few, DO 08/02/2014, 12:13 AM PGY-1, [place] Intern pager: [phone], text pages welcome  I have seen and examined the patient with Dr Gerlean Ren and agree with her assessment and plan with my additions in blue.  Hilton Sinclair, MD PGY-3, [place]

## 2014-08-02 NOTE — Progress Notes (Signed)
Pt had troponin of 0.5. Trending down. MD aware.

## 2014-08-02 NOTE — Progress Notes (Signed)
UR completed 

## 2014-08-03 DIAGNOSIS — I2510 Atherosclerotic heart disease of native coronary artery without angina pectoris: Secondary | ICD-10-CM

## 2014-08-03 DIAGNOSIS — I10 Essential (primary) hypertension: Secondary | ICD-10-CM | POA: Diagnosis not present

## 2014-08-03 DIAGNOSIS — N189 Chronic kidney disease, unspecified: Secondary | ICD-10-CM | POA: Diagnosis not present

## 2014-08-03 DIAGNOSIS — E118 Type 2 diabetes mellitus with unspecified complications: Secondary | ICD-10-CM

## 2014-08-03 DIAGNOSIS — N179 Acute kidney failure, unspecified: Secondary | ICD-10-CM | POA: Diagnosis not present

## 2014-08-03 DIAGNOSIS — E875 Hyperkalemia: Secondary | ICD-10-CM | POA: Diagnosis not present

## 2014-08-03 DIAGNOSIS — Z862 Personal history of diseases of the blood and blood-forming organs and certain disorders involving the immune mechanism: Secondary | ICD-10-CM | POA: Diagnosis not present

## 2014-08-03 DIAGNOSIS — I129 Hypertensive chronic kidney disease with stage 1 through stage 4 chronic kidney disease, or unspecified chronic kidney disease: Secondary | ICD-10-CM | POA: Diagnosis not present

## 2014-08-03 DIAGNOSIS — R51 Headache: Secondary | ICD-10-CM | POA: Diagnosis not present

## 2014-08-03 LAB — BASIC METABOLIC PANEL WITH GFR
Anion gap: 6 (ref 5–15)
BUN: 20 mg/dL (ref 6–23)
CO2: 24 mmol/L (ref 19–32)
Calcium: 8.9 mg/dL (ref 8.4–10.5)
Chloride: 108 mmol/L (ref 96–112)
Creatinine, Ser: 1.42 mg/dL — ABNORMAL HIGH (ref 0.50–1.10)
GFR calc Af Amer: 41 mL/min — ABNORMAL LOW
GFR calc non Af Amer: 35 mL/min — ABNORMAL LOW
Glucose, Bld: 154 mg/dL — ABNORMAL HIGH (ref 70–99)
Potassium: 4.5 mmol/L (ref 3.5–5.1)
Sodium: 138 mmol/L (ref 135–145)

## 2014-08-03 LAB — GLUCOSE, CAPILLARY
Glucose-Capillary: 156 mg/dL — ABNORMAL HIGH (ref 70–99)
Glucose-Capillary: 143 mg/dL — ABNORMAL HIGH (ref 70–99)
Glucose-Capillary: 191 mg/dL — ABNORMAL HIGH (ref 70–99)
Glucose-Capillary: 133 mg/dL — ABNORMAL HIGH (ref 70–99)

## 2014-08-03 MED ORDER — SODIUM POLYSTYRENE SULFONATE 15 GM/60ML PO SUSP
60.0000 g | Freq: Once | ORAL | Status: DC
  Filled 2014-08-03: qty 240

## 2014-08-03 MED ORDER — METOPROLOL TARTRATE 25 MG PO TABS
25.0000 mg | ORAL_TABLET | Freq: Two times a day (BID) | ORAL | Status: DC
  Administered 2014-08-03 – 2014-08-04 (×2): 25 mg via ORAL
  Filled 2014-08-03 (×3): qty 1

## 2014-08-03 NOTE — Progress Notes (Signed)
UR completed 

## 2014-08-03 NOTE — Progress Notes (Signed)
Family Medicine Teaching Service Daily Progress Note Intern Pager: [phone]  Patient name: Brittany Roberts Medical record number: [id number] Date of birth: 06-15-1941 Age: 74 y.o. Gender: female  Primary Care Provider: Zigmund Gottron, MD Consultants: none Code Status: full per discussion on admission  Pt Overview and Major Events to Date:  1/29 - admit for HA and HTN urgency  Assessment and Plan: Brittany Roberts is a 74 y.o. female presenting with headache . PMH is significant for lupus, DM, HTN, diastolic CHF, Hypothyroidism, Depression, CAD with PCI in 2014, PMR, generalized pain, COPD, and unspecified renal failure with cystic kidneys.   # Headache- Resolved and now recurrent. Not as bad as on admission. Suspected to be related to elevated blood pressures. Head CT showed no acute intracranial process with mild atrophy due to chronic microvascular ischemic disease. Consider Temporal Arteritis in ddx due to location, elevated ESR 52, and history of polymyalgia rheumatica. Solumedrol given in ED. Neurological exam normal. - Consider prednisone daily for possible temporal arteritis. Consider biopsy. - doubt TA currently - Tylenol PRN mild pain, Norco PRN moderate pain - If any neurological abnormalities, could consider MRI.  # Hypertension with hypertensive urgency- Improving.  Blood pressure elevated to 202/74 at admission with no end-organ findings (other than headache), trop neg x 1. Home medications include Metoprolol Succinate 65m, Aldactone 224m suspect noncompliance playing a role. - Monitor on tele - Continue home Metoprolol (on tartrate in place of succinate during admission) and Imdur. - Can consider increasing Metoprolol as HR allows for continued high BPs - Monitor BPs - currently 15974Bystolic - Hydralazine PRN SBP>200 or DBP >110 - Follow up repeat EKG - Troponins mildly elevated to 0.09, but downtrended - suspect demand  # Hyperkalemia: K wnl on admission, but elevated to  5.9 during the day. Could be related to home aldactone. - s/p 2 doses of Kayexylate - will repeat prn - s/p Ca gluconate - f/u repeat EKG - f/u AM K and continue to monitor - Holding aldactone  # AKI: Cr 1.25 on admission, previously as high as 3.9.  Small bump to 1.48 during admission.  Suspect related to dropping BP too quickly. - Continue to monitor  # Prolonged QTc: QTc 662 on admit EKG (though recalculated manually to ~480). - f/u Repeat EKG - Can consider stopping Lexapro/Trazodone if remains prolonged.  # T2DM -. Last A1C 5.8 on 07/19/14. - Sensitive Sliding Scale, Lantus 20Units (decreased from home 30 initially) - Monitor CBGs - 150s - 200s O/N  # Hypothyroidism- Home medication Levothyroxine 5091m Last TSH 1.049 on 07/19/14. - Continue home medication  # Depression- Continue Home Lexapro 43m31m Insomnia- Continue Home Trazodone 50mg86mN/GI: Soft Diet (left dentures at home) Prophylaxis: SQ heparin  Disposition: Pending improvement in BP, PT/OT/SW consulted for possible SNF placement  Subjective:  Feeling well. Would consider short-term SNF but wants to take care of some bills at home.  HA is back this AM but not as bad as on admission.  This is similar to chronic headaches.  Objective: Temp:  [98.7 F (37.1 C)-98.8 F (37.1 C)] 98.8 F (37.1 C) (01/30 0410) Pulse Rate:  [67-85] 85 (01/30 0410) Resp:  [17-20] 17 (01/30 0410) BP: (135-171)/(50-87) 155/82 mmHg (01/30 0410) SpO2:  [98 %-100 %] 100 % (01/30 0410) Weight:  [279 lb 8 oz (126.78 kg)] 279 lb 8 oz (126.78 kg) (01/29 2105) Physical Exam: General: 73yo 20yole resting comfortably in NAD HEENT: AT/[place], no teeth, EOMI, PERRL, o/p  clear, neck supple, small pinpoint scabs from thyroid biopsy, no obvious masses, MMM; no deformity, erythema, tenderness, or swelling along forehead, right temple, or left temple. Malar rash on face. Cardiovascular: RRR, no m/r/g, distant, 2+ B DP pulses Respiratory: CTAB no w/r/c,  normal WOB. Abdomen: obese, soft and nondistended. Bowel sounds noted. No tenderness to palpation. Extremities: No LE edema; tender bilateral shins Skin: No rashes noted. WWP. Neuro: AAO. No focal deficits noted.  Laboratory:  Recent Labs Lab 08/01/14 2148 08/02/14 0233  WBC 10.3 12.0*  HGB 10.6* 11.3*  HCT 39.6 32.7*  PLT 189 217    Recent Labs Lab 08/02/14 0233 08/02/14 1614 08/02/14 2200  NA 137 138 137  K 5.6* 5.9* 5.3*  CL 108 110 107  CO2 22 21 26   BUN 12 16 19   CREATININE 1.16* 1.47* 1.48*  CALCIUM 9.4 9.6 8.9  GLUCOSE 199* 221* 203*    - BNP 66.7 - ESR 52 Troponins 0.03, 0.09, 0.05, 0.05  Imaging/Diagnostic Tests: Dg Chest 2 View (1/28):  Cardiac enlargement with mild pulmonary vascular congestion. No edema or consolidation. Chronic bronchitic changes.  Ct Head Wo Contrast (1/28): 1. No acute intracranial process. 2. Mild atrophy with chronic microvascular ischemic disease. 3. Sequelae of chronic right maxillary sinus disease with prior sinus surgery.  Brittany Paganini, MD 08/03/2014, 8:45 AM PGY-1, [place] Intern pager: [phone], text pages welcome

## 2014-08-03 NOTE — Discharge Summary (Signed)
[facility] Discharge Summary  Patient name: TAEGEN LENNOX Medical record number: [id number] Date of birth: 27-Sep-1940 Age: 74 y.o. Gender: female Date of Admission: 08/01/2014  Date of Discharge: 08/04/2014 Admitting Physician: Dickie La, MD  Primary Care Provider: Zigmund Gottron, MD Consultants: none  Indication for Hospitalization: HA and hypertensive urgency  Discharge Diagnoses/Problem List:  Headache secondary to HTN Hypertension with hypertensive urgency Hyperkalemia AKI Prolonged QTc T2DM Hypothyroidism Depression Insomnia  Disposition: home with home health  Discharge Condition: stable  Brief Hospital Course:  Brittany Roberts is a 74 y.o. female presenting with headache and hypertensive urgency. PMH is significant for lupus, DM, HTN, diastolic CHF, Hypothyroidism, Depression, CAD with PCI in 2014, PMR, generalized pain, COPD, and unspecified renal failure with cystic kidneys.   # Headache- Patient presenting with acute worsening of chronic headaches.   Head CT showed no acute intracranial process with mild atrophy due to chronic microvascular ischemic disease. Temporal Arteritis was thought of as possible etiology given location, elevated ESR 52, and history of polymyalgia rheumatica. Solumedrol given in ED. Neurological exam normal, HA resolved without further steroids, and patient nontender over temporal arteries, so doubt TA as etiology.  Suspected to be related to elevated blood pressures.   # Hypertension with hypertensive urgency- Blood pressure elevated to 202/74 at admission with no end-organ findings (other than headache). Troponins mildly elevated to 0.09 but then downtrended - suspect mild demand related bump. No EKG changes. Restarted home medications - Metoprolol and Imdur and BP improved to 008Q systolic.  Home aldactone held in setting of hyperkalemia, which was continued to be held on discharge until follow up appointment.  #  Hyperkalemia: K wnl on admission, but elevated to 5.9 during the day. Could be related to home aldactone, so it was held.  Patient treated with kalexylate and Ca gluconate.  Prior to discharge it had resolved.  # AKI: Cr 1.25 on admission, but previously as high as 3.9. Small bump to 1.54 during admission. Suspect related to dropping BP too quickly.   # Prolonged QTc: QTc 662 on admit EKG (though recalculated manually to ~480). On repeat EKG QTc was 440.   # T2DM - Managed with SSI and Lantus 20 units (decreased from home 30 units on admission). Advised to continue home regimen on discharge  All other chronic medical conditions stable throughout admission and managed with home regimens.  Issues for Follow Up:  1. Repeat Bmet to monitor K and Cr 2. Restart aldactone if BPs elevated 3. Monitor HR, metoprolol dose was increased  Significant Procedures: none  Significant Labs and Imaging:   Recent Labs Lab 08/01/14 2148 08/02/14 0233 08/04/14 0432  WBC 10.3 12.0* 13.7*  HGB 10.6* 11.3* 10.9*  HCT 39.6 32.7* 31.7*  PLT 189 217 209    Recent Labs Lab 08/02/14 0233 08/02/14 0725 08/02/14 1614 08/02/14 2200 08/03/14 0805 08/04/14 0432  NA 137  --  138 137 138 138  K 5.6*  --  5.9* 5.3* 4.5 4.6  CL 108  --  110 107 108 109  CO2 22  --  21 26 24 23   GLUCOSE 199*  --  221* 203* 154* 110*  BUN 12  --  16 19 20  28*  CREATININE 1.16*  --  1.47* 1.48* 1.42* 1.54*  CALCIUM 9.4  --  9.6 8.9 8.9 8.8  MG  --  1.5  --   --   --   --     Dg  Chest 2 View  08/01/2014   CLINICAL DATA:  Headache, shortness of breath, smoker.  EXAM: CHEST  2 VIEW  COMPARISON:  01/10/2006  FINDINGS: Cardiac enlargement with mild prominence of pulmonary vascularity. No edema or consolidation in the lungs. Central interstitial changes likely representing of chronic bronchitis. No blunting of costophrenic angles. No pneumothorax. Calcified and tortuous aorta. Degenerative changes in the shoulders. Surgical  clips in the right upper quadrant.  IMPRESSION: Cardiac enlargement with mild pulmonary vascular congestion. No edema or consolidation. Chronic bronchitic changes.   Electronically Signed   By: Lucienne Capers M.D.   On: 08/01/2014 22:25   Ct Head Wo Contrast  08/01/2014   CLINICAL DATA:  Initial evaluation for several month history of headache.  EXAM: CT HEAD WITHOUT CONTRAST  TECHNIQUE: Contiguous axial images were obtained from the base of the skull through the vertex without intravenous contrast.  COMPARISON:  Prior study from 06/24/2004.  FINDINGS: Mild diffuse prominence of the CSF containing spaces is compatible with generalized cerebral atrophy. Patchy hypodensity within the periventricular and deep white matter of both cerebral hemispheres most consistent with chronic small vessel ischemic disease.  No acute large vessel territory infarct. No mass lesion or midline shift. Ventricles normal in size without evidence of hydrocephalus. No extra-axial fluid collection.  Calvarium intact. No scalp soft tissue abnormality identified. No acute abnormality seen about the orbits.  Sequela of chronic right maxillary sinus disease with probable prior Caldwell-Luc procedure noted. Paranasal sinuses are otherwise clear. No mastoid effusion.  IMPRESSION: 1. No acute intracranial process. 2. Mild atrophy with chronic microvascular ischemic disease. 3. Sequelae of chronic right maxillary sinus disease with prior sinus surgery.   Electronically Signed   By: Jeannine Boga M.D.   On: 08/01/2014 22:46    Results/Tests Pending at Time of Discharge: none  Discharge Medications:    Medication List    STOP taking these medications        spironolactone 25 MG tablet  Commonly known as:  ALDACTONE      TAKE these medications        aspirin EC 81 MG tablet  Take 81 mg by mouth daily.     atorvastatin 20 MG tablet  Commonly known as:  LIPITOR  Take 1 tablet (20 mg total) by mouth daily.     b complex  vitamins capsule  Take 1 capsule by mouth daily.     B-D INS SYRINGE 0.5CC/30GX1/2" 30G X 1/2" 0.5 ML Misc  Generic drug:  Insulin Syringe-Needle U-100  use as directed     Biotin 5000 MCG Tabs  Take 5,000 mcg by mouth daily.     cholecalciferol 1000 UNITS tablet  Commonly known as:  VITAMIN D  Take 1,000 Units by mouth daily.     escitalopram 20 MG tablet  Commonly known as:  LEXAPRO  Take 1 tablet by mouth daily.     gabapentin 300 MG capsule  Commonly known as:  NEURONTIN  Take 1 capsule (300 mg total) by mouth 2 (two) times daily.     glucose blood test strip  Check blood sugar three times per day.     insulin NPH Human 100 UNIT/ML injection  Commonly known as:  HUMULIN N,NOVOLIN N  Inject 30 Units into the skin every morning.     isosorbide mononitrate 60 MG 24 hr tablet  Commonly known as:  IMDUR  Take 60 mg by mouth every morning.     metoprolol succinate 50 MG 24 hr tablet  Commonly known as:  TOPROL-XL  Take 1 tablet (50 mg total) by mouth daily.     nitroGLYCERIN 0.4 MG SL tablet  Commonly known as:  NITROSTAT  Place 1 tablet (0.4 mg total) under the tongue every 5 (five) minutes as needed for chest pain.     pantoprazole 40 MG tablet  Commonly known as:  PROTONIX  Take 1 tablet (40 mg total) by mouth 2 (two) times daily.     SYNTHROID 50 MCG tablet  Generic drug:  levothyroxine  take 1 tablet by mouth once daily     SYSTANE OP  Place 1 drop into both eyes 4 (four) times daily.     traMADol 50 MG tablet  Commonly known as:  ULTRAM  TAKE 1 TABLET BY MOUTH 3 TIMES DAILY     traZODone 50 MG tablet  Commonly known as:  DESYREL  Take 1 tablet (50 mg total) by mouth at bedtime as needed for sleep.     VITAMIN A PO  Take 1 tablet by mouth daily.        Discharge Instructions: Please refer to Patient Instructions section of EMR for full details.  Patient was counseled important signs and symptoms that should prompt return to medical care, changes in  medications, dietary instructions, activity restrictions, and follow up appointments.   Follow-Up Appointments: Follow-up Information    Follow up with Zigmund Gottron, MD On 08/07/2014.   Specialty:  Family Medicine   Why:  at 3:15 pm for hospital follow up   Contact information:   [place] [address] [place], MD 08/04/2014, 11:35 PM PGY-2, [place]

## 2014-08-03 NOTE — Evaluation (Signed)
Physical Therapy Evaluation Patient Details Name: Brittany Roberts MRN: [id number] DOB: 09-25-1940 Today's Date: 08/03/2014   History of Present Illness  Pt adm with headache and hypertensive urgency. PMH -  lupus, DM, HTN, diastolic CHF, Hypothyroidism, Depression, CAD with PCI in 2014, PMR, generalized pain, COPD, and unspecified renal failure with cystic kidneys.   Clinical Impression  Pt admitted with above diagnosis. Pt currently with functional limitations due to the deficits listed below (see PT Problem List).  Pt will benefit from skilled PT to increase their independence and safety with mobility to allow discharge to the venue listed below.  Pt mobilizing fairly well and does not want to go to SNF. Pt has aide 7 days/week. Pt to have eye surgery in a few weeks and recommended she may need additional assistance immediately after that.     Follow Up Recommendations Home health PT;Supervision - Intermittent    Equipment Recommendations  None recommended by PT    Recommendations for Other Services       Precautions / Restrictions Precautions Precautions: Fall      Mobility  Bed Mobility                  Transfers Overall transfer level: Modified independent Equipment used: 4-wheeled walker             General transfer comment: Sit to stand with appropriated use of hands.  Ambulation/Gait Ambulation/Gait assistance: Min guard;Supervision Ambulation Distance (Feet): 200 Feet Assistive device: 4-wheeled walker Gait Pattern/deviations: Step-through pattern;Decreased stride length;Wide base of support     General Gait Details: Initially pt with supervision only. As distance incr pt with min guard assist. At home pt likely amb shorter distances.  Stairs            Wheelchair Mobility    Modified Rankin (Stroke Patients Only)       Balance Overall balance assessment: Needs assistance Sitting-balance support: No upper extremity supported Sitting  balance-Leahy Scale: Good     Standing balance support: No upper extremity supported Standing balance-Leahy Scale: Fair                               Pertinent Vitals/Pain Pain Assessment: 0-10 Pain Score: 7  Pain Location: head Pain Descriptors / Indicators: Aching Pain Intervention(s): Repositioned;Limited activity within patient's tolerance;Monitored during session    Home Living Family/patient expects to be discharged to:: Private residence Living Arrangements: Alone Available Help at Discharge: Personal care attendant Type of Home: Apartment Home Access: Stairs to enter   [employer] of Steps: 1 Home Layout: One level Home Equipment: Walker - 4 wheels Additional Comments: Pt has an aide 7 days/week for 2-3 hours.    Prior Function Level of Independence: Needs assistance   Gait / Transfers Assistance Needed: amb modified independent with rollator  ADL's / Homemaking Assistance Needed: Aide assist with bathing, dressing, and meals        Hand Dominance   Dominant Hand: Right    Extremity/Trunk Assessment   Upper Extremity Assessment: Defer to OT evaluation           Lower Extremity Assessment: Generalized weakness         Communication   Communication: No difficulties  Cognition Arousal/Alertness: Awake/alert Behavior During Therapy: WFL for tasks assessed/performed Overall Cognitive Status: Within Functional Limits for tasks assessed  General Comments      Exercises        Assessment/Plan    PT Assessment Patient needs continued PT services  PT Diagnosis Difficulty walking;Generalized weakness   PT Problem List Decreased strength;Decreased activity tolerance;Decreased balance;Decreased mobility;Obesity  PT Treatment Interventions DME instruction;Gait training;Functional mobility training;Therapeutic activities;Therapeutic exercise;Balance training;Patient/family education   PT Goals  (Current goals can be found in the Care Plan section) Acute Rehab PT Goals Patient Stated Goal: Return home PT Goal Formulation: With patient Time For Goal Achievement: 08/10/14 Potential to Achieve Goals: Good    Frequency Min 3X/week   Barriers to discharge        Co-evaluation               End of Session   Activity Tolerance: Patient tolerated treatment well Patient left: in chair;with call bell/phone within reach Nurse Communication: Mobility status         Time: 4098-1191 PT Time Calculation (min) (ACUTE ONLY): 20 min   Charges:   PT Evaluation $Initial PT Evaluation Tier I: 1 Procedure     PT G Codes:        Meigan Pates 08/14/2014, 1:53 PM  [facility] PT [phone]

## 2014-08-04 DIAGNOSIS — R51 Headache: Secondary | ICD-10-CM | POA: Diagnosis not present

## 2014-08-04 DIAGNOSIS — Z862 Personal history of diseases of the blood and blood-forming organs and certain disorders involving the immune mechanism: Secondary | ICD-10-CM | POA: Diagnosis not present

## 2014-08-04 DIAGNOSIS — I129 Hypertensive chronic kidney disease with stage 1 through stage 4 chronic kidney disease, or unspecified chronic kidney disease: Secondary | ICD-10-CM | POA: Diagnosis not present

## 2014-08-04 DIAGNOSIS — N179 Acute kidney failure, unspecified: Secondary | ICD-10-CM | POA: Diagnosis not present

## 2014-08-04 DIAGNOSIS — I10 Essential (primary) hypertension: Secondary | ICD-10-CM | POA: Diagnosis not present

## 2014-08-04 LAB — BASIC METABOLIC PANEL
Anion gap: 6 (ref 5–15)
BUN: 28 mg/dL — ABNORMAL HIGH (ref 6–23)
CO2: 23 mmol/L (ref 19–32)
Calcium: 8.8 mg/dL (ref 8.4–10.5)
Chloride: 109 mmol/L (ref 96–112)
Creatinine, Ser: 1.54 mg/dL — ABNORMAL HIGH (ref 0.50–1.10)
GFR calc Af Amer: 37 mL/min — ABNORMAL LOW (ref >90)
GFR calc non Af Amer: 32 mL/min — ABNORMAL LOW (ref >90)
Glucose, Bld: 110 mg/dL — ABNORMAL HIGH (ref 70–99)
Potassium: 4.6 mmol/L (ref 3.5–5.1)
Sodium: 138 mmol/L (ref 135–145)

## 2014-08-04 LAB — CBC
HCT: 31.7 % — ABNORMAL LOW (ref 36.0–46.0)
Hemoglobin: 10.9 g/dL — ABNORMAL LOW (ref 12.0–15.0)
MCH: 24.5 pg — ABNORMAL LOW (ref 26.0–34.0)
MCHC: 34.4 g/dL (ref 30.0–36.0)
MCV: 71.4 fL — ABNORMAL LOW (ref 78.0–100.0)
Platelets: 209 10*3/uL (ref 150–400)
RBC: 4.44 MIL/uL (ref 3.87–5.11)
RDW: 14.3 % (ref 11.5–15.5)
WBC: 13.7 10*3/uL — ABNORMAL HIGH (ref 4.0–10.5)

## 2014-08-04 LAB — GLUCOSE, CAPILLARY
Glucose-Capillary: 119 mg/dL — ABNORMAL HIGH (ref 70–99)
Glucose-Capillary: 136 mg/dL — ABNORMAL HIGH (ref 70–99)

## 2014-08-04 MED ORDER — METOPROLOL SUCCINATE ER 50 MG PO TB24: 50.0000 mg | ORAL_TABLET | Freq: Every day | ORAL | Status: DC

## 2014-08-04 NOTE — Progress Notes (Signed)
Discharge instructions and medications discussed with patient.  All questions answered.  

## 2014-08-04 NOTE — Discharge Instructions (Signed)
You were admitted for very high blood pressures. Changes to your medications are below:  Increase metoprolol to 50mg  once a day STOP taking your Aldactone unless instructed to continue at your follow up appointment  You have an appointment with Dr. [name] at the [facility] on 08/07/14 at 3:15pm, please arrive 15 minutes before

## 2014-08-04 NOTE — Progress Notes (Signed)
Family Medicine Teaching Service Daily Progress Note Intern Pager: [phone]  Patient name: Brittany Roberts Medical record number: [id number] Date of birth: 1940/08/09 Age: 74 y.o. Gender: female  Primary Care Provider: Zigmund Gottron, MD Consultants: none Code Status: full per discussion on admission  Pt Overview and Major Events to Date:  1/29 - admit for HA and HTN urgency  Assessment and Plan: Brittany Roberts is a 74 y.o. female presenting with headache . PMH is significant for lupus, DM, HTN, diastolic CHF, Hypothyroidism, Depression, CAD with PCI in 2014, PMR, generalized pain, COPD, and unspecified renal failure with cystic kidneys.   # Headache- Resolved and now recurrent. Not as bad as on admission. Suspected to be related to elevated blood pressures. Head CT showed no acute intracranial process with mild atrophy due to chronic microvascular ischemic disease. Consider Temporal Arteritis in ddx due to location, elevated ESR 52, and history of polymyalgia rheumatica. Solumedrol given in ED. Neurological exam normal. - Low suspicion for, but consider prednisone daily and biopsy for possible temporal arteritis. - Tylenol PRN mild pain, Norco PRN moderate pain - If any neurological abnormalities, could consider MRI.  # Hypertension with hypertensive urgency- Improving.  Blood pressure elevated to 202/74 at admission with no end-organ findings (other than headache), trop neg x 1. Home medications include Metoprolol Succinate 6m, Aldactone 257m suspect nonadherence playing a role. Troponins mildly elevated to 0.09, but downtrended - suspect demand - Monitor on tele - Continue home Metoprolol (on tartrate in place of succinate during admission) and Imdur, currently holding aldactone. - Can consider increasing Metoprolol as HR allows for continued high BPs - Monitor BPs - soft overnight 126/45 and 104/58, repeat 147/56 - Hydralazine PRN SBP>200 or DBP >110  # Hyperkalemia: K wnl on  admission, but elevated to 5.9 during the day. Could be related to home aldactone. - s/p 2 doses of Kayexylate - will repeat prn - s/p Ca gluconate - f/u repeat EKG - f/u AM K and continue to monitor - Holding aldactone  # AKI: Cr 1.25 on admission, previously as high as 3.9. Suspect related to dropping BP too quickly, which would be consistent with slight uptrending 1.42>>1.54 - Continue to monitor - trend with BMP  # Prolonged QTc: QTc 662 on admit EKG (though recalculated manually to ~480). - Can consider stopping Lexapro/Trazodone if remains prolonged.  # T2DM -. Last A1C 5.8 on 07/19/14. - Sensitive Sliding Scale, Lantus 20Units (decreased from home 30 initially) - Monitor CBGs - 150s - 200s O/N  # Hypothyroidism- Home medication Levothyroxine 5058m Last TSH 1.049 on 07/19/14. - Continue home medication  # Depression- Continue Home Lexapro 46m90m Insomnia- Continue Home Trazodone 50mg36mN/GI: Soft Diet (left dentures at home) Prophylaxis: SQ heparin  Disposition: Pending improvement in BP, plan on home with home health  Subjective:  Says she feels well this morning, only a dull headache, no CP or SOB, no other pain. Feels okay to go home today. States she was out of bed and did not feel dizzy. Says she would not want to go to a nursing facility, she lives at home but will have help (already had CNA).   Objective: Temp:  [98.4 F (36.9 C)-98.6 F (37 C)] 98.5 F (36.9 C) (01/31 0523) Pulse Rate:  [57-72] 57 (01/31 0523) Resp:  [16-17] 17 (01/31 0523) BP: (104-187)/(45-84) 104/58 mmHg (01/31 0523) SpO2:  [98 %-100 %] 99 % (01/31 0523) Weight:  [282 lb (127.914 kg)] 282 lb (127.914 kg) (  01/30 2053) Physical Exam: General: 74yo female resting comfortably in NAD, lying down in bed HEENT: AT/[place], no teeth, EOMI, PERRL Cardiovascular: RRR, no m/r/g, distant, 2+ B DP pulses Respiratory: CTAB no w/r/c, normal WOB. Abdomen: obese, soft and nondistended. Bowel sounds noted.  No tenderness to palpation. Extremities: No LE edema Skin: No rashes noted. WWP. Neuro: alert and oriented, no focal deficits  Laboratory:  Recent Labs Lab 08/01/14 2148 08/02/14 0233 08/04/14 0432  WBC 10.3 12.0* 13.7*  HGB 10.6* 11.3* 10.9*  HCT 39.6 32.7* 31.7*  PLT 189 217 209    Recent Labs Lab 08/02/14 2200 08/03/14 0805 08/04/14 0432  NA 137 138 138  K 5.3* 4.5 4.6  CL 107 108 109  CO2 _0 BUN 19 20 28*  CREATININE 1.48* 1.42* 1.54*  CALCIUM 8.9 8.9 8.8  GLUCOSE 203* 154* 110*    - BNP 66.7 - ESR 52 Troponins 0.03, 0.09, 0.05, 0.05  Imaging/Diagnostic Tests: Dg Chest 2 View (1/28):  Cardiac enlargement with mild pulmonary vascular congestion. No edema or consolidation. Chronic bronchitic changes.  Ct Head Wo Contrast (1/28): 1. No acute intracranial process. 2. Mild atrophy with chronic microvascular ischemic disease. 3. Sequelae of chronic right maxillary sinus disease with prior sinus surgery.  Leone Brand, MD 08/04/2014, 9:20 AM PGY-2, [place] Intern pager: [phone], text pages welcome

## 2014-08-04 NOTE — Progress Notes (Signed)
UR completed 

## 2014-08-05 ENCOUNTER — Other Ambulatory Visit: Payer: Self-pay | Admitting: Family Medicine

## 2014-08-05 DIAGNOSIS — E118 Type 2 diabetes mellitus with unspecified complications: Secondary | ICD-10-CM

## 2014-08-05 DIAGNOSIS — E079 Disorder of thyroid, unspecified: Secondary | ICD-10-CM

## 2014-08-05 DIAGNOSIS — M3210 Systemic lupus erythematosus, organ or system involvement unspecified: Secondary | ICD-10-CM | POA: Diagnosis not present

## 2014-08-05 NOTE — Progress Notes (Signed)
PT Note Late G code entry    08/03/14 1359  PT G-Codes **NOT FOR INPATIENT CLASS**  Functional Assessment Tool Used clinical judgement  Functional Limitation Mobility: Walking and moving around  Mobility: Walking and Moving Around Current Status [phone]) CI  Mobility: Walking and Moving Around Goal Status [phone]) [place]

## 2014-08-05 NOTE — Assessment & Plan Note (Signed)
Called in diabetic needles and syringes to the pharmacy per patient request.

## 2014-08-05 NOTE — Assessment & Plan Note (Signed)
Informed patient that thyroid biopsy was benign.

## 2014-08-06 DIAGNOSIS — M3210 Systemic lupus erythematosus, organ or system involvement unspecified: Secondary | ICD-10-CM | POA: Diagnosis not present

## 2014-08-06 NOTE — Care Management Note (Signed)
CARE MANAGEMENT NOTE 08/06/2014  Patient:  DELORIS, BIERL   Account Number:  [id number]  Date Initiated:  08/05/2014  Documentation initiated by:  Naylea Wigington  Subjective/Objective Assessment:   CM following for progression and d/c planning.     Action/Plan:   Noted order for HHPT and aide, however pt has an aide from [facility] thru her Medicaid. Pt also stated that the MD said he would order a [facility] for medication management. This CM added that order. Pt selected [facility].   Anticipated DC Date:  08/04/2014   Anticipated DC Plan:  [place]         Choice offered to / List presented to:          [facility] arranged  HH-1 RN  [place].   Status of service:  Completed, signed off Medicare Important Message given?  NA - LOS <3 / Initial given by admissions (If response is "NO", the following Medicare IM given date fields will be blank) Date Medicare IM given:   Medicare IM given by:   Date Additional Medicare IM given:   Additional Medicare IM given by:    Discharge Disposition:  [place]  Per UR Regulation:    If discussed at Long Length of Stay Meetings, dates discussed:    Comments:  08/06/2014 Record reviewed and noted orders for [place] entered late Sunday 08/04/2014 pm.  HHRN added by this CM as pt states that her MD had stated that she was not taking meds correctly and that she needed a [facility] for medication management. HHRN ordered and pt stated that she has previously used [facility] , so [facility] notified of need for [facility] services. Jasmine Pang RN MPH, case manager, [phone]

## 2014-08-07 ENCOUNTER — Ambulatory Visit (INDEPENDENT_AMBULATORY_CARE_PROVIDER_SITE_OTHER): Payer: Medicare Other | Admitting: Family Medicine

## 2014-08-07 ENCOUNTER — Encounter: Payer: Self-pay | Admitting: Family Medicine

## 2014-08-07 DIAGNOSIS — I10 Essential (primary) hypertension: Secondary | ICD-10-CM

## 2014-08-07 DIAGNOSIS — M3210 Systemic lupus erythematosus, organ or system involvement unspecified: Secondary | ICD-10-CM | POA: Diagnosis not present

## 2014-08-07 LAB — BASIC METABOLIC PANEL
BUN: 25 mg/dL — ABNORMAL HIGH (ref 6–23)
CO2: 23 mEq/L (ref 19–32)
Calcium: 9 mg/dL (ref 8.4–10.5)
Chloride: 106 mEq/L (ref 96–112)
Creat: 1.51 mg/dL — ABNORMAL HIGH (ref 0.50–1.10)
Glucose, Bld: 96 mg/dL (ref 70–99)
Potassium: 4.8 mEq/L (ref 3.5–5.3)
Sodium: 139 mEq/L (ref 135–145)

## 2014-08-07 MED ORDER — AMLODIPINE BESYLATE 10 MG PO TABS: 10.0000 mg | ORAL_TABLET | Freq: Every day | ORAL | Status: DC

## 2014-08-07 NOTE — Progress Notes (Signed)
   Subjective:    Patient ID: Brittany Roberts, female    DOB: Nov 04, 1940, 74 y.o.   MRN: [id number]  HPI  Fu hospitalization.   Still headache.  Tylenol helps BP still up, needs BMP No other complaints    Review of Systems     Objective:   Physical Exam Lungs clear Cardiac RRR without m or g       Assessment & Plan:

## 2014-08-07 NOTE — Assessment & Plan Note (Signed)
Restart norvasc. BMP

## 2014-08-07 NOTE — Patient Instructions (Signed)
Stay on the tylenol for headache - no more than six pills a day. I put you back on amlodipine/norvasc for your high blood pressure I will call Monday with the blood test results See me in 2 weeks to continue to adjust your medications.

## 2014-08-08 DIAGNOSIS — I2510 Atherosclerotic heart disease of native coronary artery without angina pectoris: Secondary | ICD-10-CM | POA: Diagnosis not present

## 2014-08-08 DIAGNOSIS — I5031 Acute diastolic (congestive) heart failure: Secondary | ICD-10-CM | POA: Diagnosis not present

## 2014-08-08 DIAGNOSIS — E119 Type 2 diabetes mellitus without complications: Secondary | ICD-10-CM | POA: Diagnosis not present

## 2014-08-08 DIAGNOSIS — J449 Chronic obstructive pulmonary disease, unspecified: Secondary | ICD-10-CM | POA: Diagnosis not present

## 2014-08-08 DIAGNOSIS — Z9981 Dependence on supplemental oxygen: Secondary | ICD-10-CM | POA: Diagnosis not present

## 2014-08-08 DIAGNOSIS — Z794 Long term (current) use of insulin: Secondary | ICD-10-CM | POA: Diagnosis not present

## 2014-08-08 DIAGNOSIS — E038 Other specified hypothyroidism: Secondary | ICD-10-CM | POA: Diagnosis not present

## 2014-08-08 DIAGNOSIS — I10 Essential (primary) hypertension: Secondary | ICD-10-CM | POA: Diagnosis not present

## 2014-08-08 DIAGNOSIS — M3210 Systemic lupus erythematosus, organ or system involvement unspecified: Secondary | ICD-10-CM | POA: Diagnosis not present

## 2014-08-09 DIAGNOSIS — I2510 Atherosclerotic heart disease of native coronary artery without angina pectoris: Secondary | ICD-10-CM | POA: Diagnosis not present

## 2014-08-09 DIAGNOSIS — M3210 Systemic lupus erythematosus, organ or system involvement unspecified: Secondary | ICD-10-CM | POA: Diagnosis not present

## 2014-08-09 DIAGNOSIS — I10 Essential (primary) hypertension: Secondary | ICD-10-CM | POA: Diagnosis not present

## 2014-08-09 DIAGNOSIS — Z9981 Dependence on supplemental oxygen: Secondary | ICD-10-CM | POA: Diagnosis not present

## 2014-08-09 DIAGNOSIS — I5031 Acute diastolic (congestive) heart failure: Secondary | ICD-10-CM | POA: Diagnosis not present

## 2014-08-09 DIAGNOSIS — E119 Type 2 diabetes mellitus without complications: Secondary | ICD-10-CM | POA: Diagnosis not present

## 2014-08-09 DIAGNOSIS — E038 Other specified hypothyroidism: Secondary | ICD-10-CM | POA: Diagnosis not present

## 2014-08-09 DIAGNOSIS — J449 Chronic obstructive pulmonary disease, unspecified: Secondary | ICD-10-CM | POA: Diagnosis not present

## 2014-08-09 DIAGNOSIS — Z794 Long term (current) use of insulin: Secondary | ICD-10-CM | POA: Diagnosis not present

## 2014-08-10 DIAGNOSIS — M3210 Systemic lupus erythematosus, organ or system involvement unspecified: Secondary | ICD-10-CM | POA: Diagnosis not present

## 2014-08-11 DIAGNOSIS — M3210 Systemic lupus erythematosus, organ or system involvement unspecified: Secondary | ICD-10-CM | POA: Diagnosis not present

## 2014-08-12 DIAGNOSIS — M3210 Systemic lupus erythematosus, organ or system involvement unspecified: Secondary | ICD-10-CM | POA: Diagnosis not present

## 2014-08-13 DIAGNOSIS — E038 Other specified hypothyroidism: Secondary | ICD-10-CM | POA: Diagnosis not present

## 2014-08-13 DIAGNOSIS — I10 Essential (primary) hypertension: Secondary | ICD-10-CM | POA: Diagnosis not present

## 2014-08-13 DIAGNOSIS — I5031 Acute diastolic (congestive) heart failure: Secondary | ICD-10-CM | POA: Diagnosis not present

## 2014-08-13 DIAGNOSIS — Z794 Long term (current) use of insulin: Secondary | ICD-10-CM | POA: Diagnosis not present

## 2014-08-13 DIAGNOSIS — E119 Type 2 diabetes mellitus without complications: Secondary | ICD-10-CM | POA: Diagnosis not present

## 2014-08-13 DIAGNOSIS — Z9981 Dependence on supplemental oxygen: Secondary | ICD-10-CM | POA: Diagnosis not present

## 2014-08-13 DIAGNOSIS — J449 Chronic obstructive pulmonary disease, unspecified: Secondary | ICD-10-CM | POA: Diagnosis not present

## 2014-08-13 DIAGNOSIS — I2510 Atherosclerotic heart disease of native coronary artery without angina pectoris: Secondary | ICD-10-CM | POA: Diagnosis not present

## 2014-08-13 DIAGNOSIS — M3210 Systemic lupus erythematosus, organ or system involvement unspecified: Secondary | ICD-10-CM | POA: Diagnosis not present

## 2014-08-14 DIAGNOSIS — J45909 Unspecified asthma, uncomplicated: Secondary | ICD-10-CM | POA: Diagnosis not present

## 2014-08-14 DIAGNOSIS — M3210 Systemic lupus erythematosus, organ or system involvement unspecified: Secondary | ICD-10-CM | POA: Diagnosis not present

## 2014-08-15 DIAGNOSIS — H2511 Age-related nuclear cataract, right eye: Secondary | ICD-10-CM | POA: Diagnosis not present

## 2014-08-15 DIAGNOSIS — H2589 Other age-related cataract: Secondary | ICD-10-CM | POA: Diagnosis not present

## 2014-08-15 DIAGNOSIS — H25812 Combined forms of age-related cataract, left eye: Secondary | ICD-10-CM | POA: Diagnosis not present

## 2014-08-16 ENCOUNTER — Telehealth: Payer: Self-pay | Admitting: Family Medicine

## 2014-08-16 DIAGNOSIS — H2511 Age-related nuclear cataract, right eye: Secondary | ICD-10-CM | POA: Diagnosis not present

## 2014-08-16 NOTE — Telephone Encounter (Signed)
Tomasita Crumble, RN with [facility] calls to make Dr. [name] aware that patient missed a home visit today. Stanton Kidney was unable to contact patient. Please be advise.

## 2014-08-17 DIAGNOSIS — M3210 Systemic lupus erythematosus, organ or system involvement unspecified: Secondary | ICD-10-CM | POA: Diagnosis not present

## 2014-08-18 DIAGNOSIS — M3210 Systemic lupus erythematosus, organ or system involvement unspecified: Secondary | ICD-10-CM | POA: Diagnosis not present

## 2014-08-20 DIAGNOSIS — E119 Type 2 diabetes mellitus without complications: Secondary | ICD-10-CM | POA: Diagnosis not present

## 2014-08-20 DIAGNOSIS — J449 Chronic obstructive pulmonary disease, unspecified: Secondary | ICD-10-CM | POA: Diagnosis not present

## 2014-08-20 DIAGNOSIS — I5031 Acute diastolic (congestive) heart failure: Secondary | ICD-10-CM | POA: Diagnosis not present

## 2014-08-20 DIAGNOSIS — Z9981 Dependence on supplemental oxygen: Secondary | ICD-10-CM | POA: Diagnosis not present

## 2014-08-20 DIAGNOSIS — I10 Essential (primary) hypertension: Secondary | ICD-10-CM | POA: Diagnosis not present

## 2014-08-20 DIAGNOSIS — I2510 Atherosclerotic heart disease of native coronary artery without angina pectoris: Secondary | ICD-10-CM | POA: Diagnosis not present

## 2014-08-20 DIAGNOSIS — M3210 Systemic lupus erythematosus, organ or system involvement unspecified: Secondary | ICD-10-CM | POA: Diagnosis not present

## 2014-08-20 DIAGNOSIS — E038 Other specified hypothyroidism: Secondary | ICD-10-CM | POA: Diagnosis not present

## 2014-08-20 DIAGNOSIS — Z794 Long term (current) use of insulin: Secondary | ICD-10-CM | POA: Diagnosis not present

## 2014-08-23 ENCOUNTER — Ambulatory Visit: Payer: Medicare Other | Admitting: Family Medicine

## 2014-08-23 DIAGNOSIS — I10 Essential (primary) hypertension: Secondary | ICD-10-CM | POA: Diagnosis not present

## 2014-08-23 DIAGNOSIS — J449 Chronic obstructive pulmonary disease, unspecified: Secondary | ICD-10-CM | POA: Diagnosis not present

## 2014-08-23 DIAGNOSIS — E038 Other specified hypothyroidism: Secondary | ICD-10-CM | POA: Diagnosis not present

## 2014-08-23 DIAGNOSIS — E119 Type 2 diabetes mellitus without complications: Secondary | ICD-10-CM | POA: Diagnosis not present

## 2014-08-23 DIAGNOSIS — I5031 Acute diastolic (congestive) heart failure: Secondary | ICD-10-CM | POA: Diagnosis not present

## 2014-08-23 DIAGNOSIS — M3210 Systemic lupus erythematosus, organ or system involvement unspecified: Secondary | ICD-10-CM | POA: Diagnosis not present

## 2014-08-23 DIAGNOSIS — Z794 Long term (current) use of insulin: Secondary | ICD-10-CM | POA: Diagnosis not present

## 2014-08-23 DIAGNOSIS — I2510 Atherosclerotic heart disease of native coronary artery without angina pectoris: Secondary | ICD-10-CM | POA: Diagnosis not present

## 2014-08-23 DIAGNOSIS — Z9981 Dependence on supplemental oxygen: Secondary | ICD-10-CM | POA: Diagnosis not present

## 2014-08-24 DIAGNOSIS — Z9981 Dependence on supplemental oxygen: Secondary | ICD-10-CM | POA: Diagnosis not present

## 2014-08-24 DIAGNOSIS — I5031 Acute diastolic (congestive) heart failure: Secondary | ICD-10-CM | POA: Diagnosis not present

## 2014-08-24 DIAGNOSIS — I2510 Atherosclerotic heart disease of native coronary artery without angina pectoris: Secondary | ICD-10-CM | POA: Diagnosis not present

## 2014-08-24 DIAGNOSIS — M3210 Systemic lupus erythematosus, organ or system involvement unspecified: Secondary | ICD-10-CM | POA: Diagnosis not present

## 2014-08-24 DIAGNOSIS — Z794 Long term (current) use of insulin: Secondary | ICD-10-CM | POA: Diagnosis not present

## 2014-08-24 DIAGNOSIS — I10 Essential (primary) hypertension: Secondary | ICD-10-CM | POA: Diagnosis not present

## 2014-08-24 DIAGNOSIS — J449 Chronic obstructive pulmonary disease, unspecified: Secondary | ICD-10-CM | POA: Diagnosis not present

## 2014-08-24 DIAGNOSIS — E119 Type 2 diabetes mellitus without complications: Secondary | ICD-10-CM | POA: Diagnosis not present

## 2014-08-24 DIAGNOSIS — E038 Other specified hypothyroidism: Secondary | ICD-10-CM | POA: Diagnosis not present

## 2014-08-25 DIAGNOSIS — M3210 Systemic lupus erythematosus, organ or system involvement unspecified: Secondary | ICD-10-CM | POA: Diagnosis not present

## 2014-08-26 DIAGNOSIS — E119 Type 2 diabetes mellitus without complications: Secondary | ICD-10-CM | POA: Diagnosis not present

## 2014-08-26 DIAGNOSIS — I10 Essential (primary) hypertension: Secondary | ICD-10-CM | POA: Diagnosis not present

## 2014-08-26 DIAGNOSIS — Z9981 Dependence on supplemental oxygen: Secondary | ICD-10-CM | POA: Diagnosis not present

## 2014-08-26 DIAGNOSIS — I5031 Acute diastolic (congestive) heart failure: Secondary | ICD-10-CM | POA: Diagnosis not present

## 2014-08-26 DIAGNOSIS — E038 Other specified hypothyroidism: Secondary | ICD-10-CM | POA: Diagnosis not present

## 2014-08-26 DIAGNOSIS — M3210 Systemic lupus erythematosus, organ or system involvement unspecified: Secondary | ICD-10-CM | POA: Diagnosis not present

## 2014-08-26 DIAGNOSIS — I2510 Atherosclerotic heart disease of native coronary artery without angina pectoris: Secondary | ICD-10-CM | POA: Diagnosis not present

## 2014-08-26 DIAGNOSIS — J449 Chronic obstructive pulmonary disease, unspecified: Secondary | ICD-10-CM | POA: Diagnosis not present

## 2014-08-26 DIAGNOSIS — Z794 Long term (current) use of insulin: Secondary | ICD-10-CM | POA: Diagnosis not present

## 2014-08-27 DIAGNOSIS — M3210 Systemic lupus erythematosus, organ or system involvement unspecified: Secondary | ICD-10-CM | POA: Diagnosis not present

## 2014-08-28 ENCOUNTER — Ambulatory Visit (INDEPENDENT_AMBULATORY_CARE_PROVIDER_SITE_OTHER): Payer: Medicare Other | Admitting: Family Medicine

## 2014-08-28 ENCOUNTER — Telehealth: Payer: Self-pay | Admitting: Family Medicine

## 2014-08-28 ENCOUNTER — Encounter: Payer: Self-pay | Admitting: Family Medicine

## 2014-08-28 VITALS — BP 135/99 | HR 101 | Ht 64.0 in | Wt 285.0 lb

## 2014-08-28 DIAGNOSIS — N182 Chronic kidney disease, stage 2 (mild): Secondary | ICD-10-CM

## 2014-08-28 DIAGNOSIS — I5032 Chronic diastolic (congestive) heart failure: Secondary | ICD-10-CM | POA: Diagnosis not present

## 2014-08-28 DIAGNOSIS — M3210 Systemic lupus erythematosus, organ or system involvement unspecified: Secondary | ICD-10-CM | POA: Diagnosis not present

## 2014-08-28 DIAGNOSIS — I10 Essential (primary) hypertension: Secondary | ICD-10-CM

## 2014-08-28 MED ORDER — FUROSEMIDE 20 MG PO TABS: 20.0000 mg | ORAL_TABLET | ORAL | Status: DC

## 2014-08-28 NOTE — Telephone Encounter (Signed)
LVM for Jerilynn to call back.

## 2014-08-28 NOTE — Patient Instructions (Addendum)
I want you to start back on furosemide 1/2 of the 40 mg tab every other day. Do not restart the spironolactone.  Stay off it.  I would like to see you again in one month to recheck your kidneys.

## 2014-08-28 NOTE — Telephone Encounter (Signed)
Jerrilyn from [facility] called and requesting verbal orders for additional home health PT. She would like 2 times a week for 2 weeks. Please call her at [phone]. Blima Rich

## 2014-08-29 DIAGNOSIS — Z9981 Dependence on supplemental oxygen: Secondary | ICD-10-CM | POA: Diagnosis not present

## 2014-08-29 DIAGNOSIS — M3210 Systemic lupus erythematosus, organ or system involvement unspecified: Secondary | ICD-10-CM | POA: Diagnosis not present

## 2014-08-29 DIAGNOSIS — E038 Other specified hypothyroidism: Secondary | ICD-10-CM | POA: Diagnosis not present

## 2014-08-29 DIAGNOSIS — I5031 Acute diastolic (congestive) heart failure: Secondary | ICD-10-CM | POA: Diagnosis not present

## 2014-08-29 DIAGNOSIS — I2510 Atherosclerotic heart disease of native coronary artery without angina pectoris: Secondary | ICD-10-CM | POA: Diagnosis not present

## 2014-08-29 DIAGNOSIS — Z794 Long term (current) use of insulin: Secondary | ICD-10-CM | POA: Diagnosis not present

## 2014-08-29 DIAGNOSIS — E119 Type 2 diabetes mellitus without complications: Secondary | ICD-10-CM | POA: Diagnosis not present

## 2014-08-29 DIAGNOSIS — J449 Chronic obstructive pulmonary disease, unspecified: Secondary | ICD-10-CM | POA: Diagnosis not present

## 2014-08-29 DIAGNOSIS — I10 Essential (primary) hypertension: Secondary | ICD-10-CM | POA: Diagnosis not present

## 2014-08-29 NOTE — Assessment & Plan Note (Signed)
Stable.  Will restart lasix and follow carefully.

## 2014-08-29 NOTE — Assessment & Plan Note (Signed)
Elevated today.  Restart lasix should help.

## 2014-08-29 NOTE — Progress Notes (Signed)
   Subjective:    Patient ID: Brittany Roberts, female    DOB: August 19, 1940, 74 y.o.   MRN: [id number]  HPI  [name] comes in today concerned about fluid retention and low urine output.  I did not understand her being tearful until her daughter explained that she was concerned that she was on the verge of dialysis.  I put the story together and was able to reassure her.  [name] likely has a baseline creat of 1.5-1.6 with CKD stage.  Last fall, she had AKI with the creat rising to a high of 3.9  I stopped her furosemide (was 40 mg daily), ACE and spironolactone.  Her creat subsequently returned to baseline.  I have not attempted to restart any of these meds since.    BP is mildly up.  Creat 2 weeks ago at baseline.  I believe her complaint of retaining fluid in that her wt is up 8 lbs in the last two weeks.  She does have a history of diastolic heart failure.     Review of Systems     Objective:   Physical Exam Lungs clear.   Wt and BP both noted to be up Cardiac rrr without m or g Abd benign Ext 2+ edema        Assessment & Plan:

## 2014-08-29 NOTE — Assessment & Plan Note (Signed)
Assume worsening CHF is the primary cause of weight gain.  Will add lasix now.  Because of DM, will also rechallenge with ACE in future.

## 2014-08-30 DIAGNOSIS — E038 Other specified hypothyroidism: Secondary | ICD-10-CM | POA: Diagnosis not present

## 2014-08-30 DIAGNOSIS — I5031 Acute diastolic (congestive) heart failure: Secondary | ICD-10-CM | POA: Diagnosis not present

## 2014-08-30 DIAGNOSIS — E119 Type 2 diabetes mellitus without complications: Secondary | ICD-10-CM | POA: Diagnosis not present

## 2014-08-30 DIAGNOSIS — Z794 Long term (current) use of insulin: Secondary | ICD-10-CM | POA: Diagnosis not present

## 2014-08-30 DIAGNOSIS — M3210 Systemic lupus erythematosus, organ or system involvement unspecified: Secondary | ICD-10-CM | POA: Diagnosis not present

## 2014-08-30 DIAGNOSIS — I10 Essential (primary) hypertension: Secondary | ICD-10-CM | POA: Diagnosis not present

## 2014-08-30 DIAGNOSIS — I2510 Atherosclerotic heart disease of native coronary artery without angina pectoris: Secondary | ICD-10-CM | POA: Diagnosis not present

## 2014-08-30 DIAGNOSIS — Z9981 Dependence on supplemental oxygen: Secondary | ICD-10-CM | POA: Diagnosis not present

## 2014-08-30 DIAGNOSIS — J449 Chronic obstructive pulmonary disease, unspecified: Secondary | ICD-10-CM | POA: Diagnosis not present

## 2014-08-30 NOTE — Telephone Encounter (Signed)
Returning Sara's call °

## 2014-08-30 NOTE — Telephone Encounter (Signed)
LMOVM for Brittany Roberts to return call. Fleeger, [name]

## 2014-08-30 NOTE — Telephone Encounter (Signed)
Brittany Roberts informed. Brittany Roberts, Brittany Roberts

## 2014-09-02 ENCOUNTER — Telehealth: Payer: Self-pay | Admitting: Family Medicine

## 2014-09-02 DIAGNOSIS — Z9981 Dependence on supplemental oxygen: Secondary | ICD-10-CM | POA: Diagnosis not present

## 2014-09-02 DIAGNOSIS — E119 Type 2 diabetes mellitus without complications: Secondary | ICD-10-CM | POA: Diagnosis not present

## 2014-09-02 DIAGNOSIS — Z794 Long term (current) use of insulin: Secondary | ICD-10-CM | POA: Diagnosis not present

## 2014-09-02 DIAGNOSIS — J449 Chronic obstructive pulmonary disease, unspecified: Secondary | ICD-10-CM | POA: Diagnosis not present

## 2014-09-02 DIAGNOSIS — E1149 Type 2 diabetes mellitus with other diabetic neurological complication: Secondary | ICD-10-CM

## 2014-09-02 DIAGNOSIS — E038 Other specified hypothyroidism: Secondary | ICD-10-CM | POA: Diagnosis not present

## 2014-09-02 DIAGNOSIS — I5031 Acute diastolic (congestive) heart failure: Secondary | ICD-10-CM | POA: Diagnosis not present

## 2014-09-02 DIAGNOSIS — I2510 Atherosclerotic heart disease of native coronary artery without angina pectoris: Secondary | ICD-10-CM | POA: Diagnosis not present

## 2014-09-02 DIAGNOSIS — I10 Essential (primary) hypertension: Secondary | ICD-10-CM | POA: Diagnosis not present

## 2014-09-02 NOTE — Telephone Encounter (Signed)
Pt is requesting a referral to the foot center on [address], says she has a numb, tingling feeling in her toe, pt is diabetic and says she has neuropathy. Advised pt I would send to her pcp and if needed we would call her back to set up an appt here at family medicine.

## 2014-09-02 NOTE — Assessment & Plan Note (Signed)
I am not sure what foot center she is talking about.  I am happy to refer to podiatry or the the [facility] wound center.  Please let me know and I will put in the referral.

## 2014-09-03 DIAGNOSIS — M3210 Systemic lupus erythematosus, organ or system involvement unspecified: Secondary | ICD-10-CM | POA: Diagnosis not present

## 2014-09-03 NOTE — Telephone Encounter (Signed)
I am not sure what foot center she is talking about.  I am happy to refer to podiatry or the the diabetic wound center.  Please let me know and I will put in the referral.

## 2014-09-04 DIAGNOSIS — Z794 Long term (current) use of insulin: Secondary | ICD-10-CM | POA: Diagnosis not present

## 2014-09-04 DIAGNOSIS — E119 Type 2 diabetes mellitus without complications: Secondary | ICD-10-CM | POA: Diagnosis not present

## 2014-09-04 DIAGNOSIS — I2510 Atherosclerotic heart disease of native coronary artery without angina pectoris: Secondary | ICD-10-CM | POA: Diagnosis not present

## 2014-09-04 DIAGNOSIS — I5031 Acute diastolic (congestive) heart failure: Secondary | ICD-10-CM | POA: Diagnosis not present

## 2014-09-04 DIAGNOSIS — I10 Essential (primary) hypertension: Secondary | ICD-10-CM | POA: Diagnosis not present

## 2014-09-04 DIAGNOSIS — E038 Other specified hypothyroidism: Secondary | ICD-10-CM | POA: Diagnosis not present

## 2014-09-04 DIAGNOSIS — Z9981 Dependence on supplemental oxygen: Secondary | ICD-10-CM | POA: Diagnosis not present

## 2014-09-04 DIAGNOSIS — J449 Chronic obstructive pulmonary disease, unspecified: Secondary | ICD-10-CM | POA: Diagnosis not present

## 2014-09-04 NOTE — Telephone Encounter (Signed)
She regularly goes to Dr. Barkley Bruns @ [facility],  Her previous referral ran out so she needs a new one placed before her appt next week .Brittany Roberts, [name]

## 2014-09-04 NOTE — Telephone Encounter (Signed)
Referral faxed to [facility] @ [phone] Attn: Baldo Ash. Brittany Roberts, Brittany Roberts

## 2014-09-04 NOTE — Telephone Encounter (Signed)
Referral to podiatry entered.

## 2014-09-05 DIAGNOSIS — H2511 Age-related nuclear cataract, right eye: Secondary | ICD-10-CM | POA: Diagnosis not present

## 2014-09-05 DIAGNOSIS — H25811 Combined forms of age-related cataract, right eye: Secondary | ICD-10-CM | POA: Diagnosis not present

## 2014-09-05 DIAGNOSIS — H2589 Other age-related cataract: Secondary | ICD-10-CM | POA: Diagnosis not present

## 2014-09-07 DIAGNOSIS — M3210 Systemic lupus erythematosus, organ or system involvement unspecified: Secondary | ICD-10-CM | POA: Diagnosis not present

## 2014-09-08 DIAGNOSIS — M3210 Systemic lupus erythematosus, organ or system involvement unspecified: Secondary | ICD-10-CM | POA: Diagnosis not present

## 2014-09-09 DIAGNOSIS — Z794 Long term (current) use of insulin: Secondary | ICD-10-CM | POA: Diagnosis not present

## 2014-09-09 DIAGNOSIS — M3210 Systemic lupus erythematosus, organ or system involvement unspecified: Secondary | ICD-10-CM | POA: Diagnosis not present

## 2014-09-09 DIAGNOSIS — I2510 Atherosclerotic heart disease of native coronary artery without angina pectoris: Secondary | ICD-10-CM | POA: Diagnosis not present

## 2014-09-09 DIAGNOSIS — E038 Other specified hypothyroidism: Secondary | ICD-10-CM | POA: Diagnosis not present

## 2014-09-09 DIAGNOSIS — J449 Chronic obstructive pulmonary disease, unspecified: Secondary | ICD-10-CM | POA: Diagnosis not present

## 2014-09-09 DIAGNOSIS — I5031 Acute diastolic (congestive) heart failure: Secondary | ICD-10-CM | POA: Diagnosis not present

## 2014-09-09 DIAGNOSIS — I10 Essential (primary) hypertension: Secondary | ICD-10-CM | POA: Diagnosis not present

## 2014-09-09 DIAGNOSIS — E119 Type 2 diabetes mellitus without complications: Secondary | ICD-10-CM | POA: Diagnosis not present

## 2014-09-09 DIAGNOSIS — Z9981 Dependence on supplemental oxygen: Secondary | ICD-10-CM | POA: Diagnosis not present

## 2014-09-10 DIAGNOSIS — M3210 Systemic lupus erythematosus, organ or system involvement unspecified: Secondary | ICD-10-CM | POA: Diagnosis not present

## 2014-09-11 DIAGNOSIS — M216X2 Other acquired deformities of left foot: Secondary | ICD-10-CM | POA: Diagnosis not present

## 2014-09-11 DIAGNOSIS — M7751 Other enthesopathy of right foot: Secondary | ICD-10-CM | POA: Diagnosis not present

## 2014-09-11 DIAGNOSIS — M7752 Other enthesopathy of left foot: Secondary | ICD-10-CM | POA: Diagnosis not present

## 2014-09-11 DIAGNOSIS — J449 Chronic obstructive pulmonary disease, unspecified: Secondary | ICD-10-CM | POA: Diagnosis not present

## 2014-09-11 DIAGNOSIS — I2510 Atherosclerotic heart disease of native coronary artery without angina pectoris: Secondary | ICD-10-CM | POA: Diagnosis not present

## 2014-09-11 DIAGNOSIS — M3210 Systemic lupus erythematosus, organ or system involvement unspecified: Secondary | ICD-10-CM | POA: Diagnosis not present

## 2014-09-11 DIAGNOSIS — I5031 Acute diastolic (congestive) heart failure: Secondary | ICD-10-CM | POA: Diagnosis not present

## 2014-09-11 DIAGNOSIS — Z9981 Dependence on supplemental oxygen: Secondary | ICD-10-CM | POA: Diagnosis not present

## 2014-09-11 DIAGNOSIS — E038 Other specified hypothyroidism: Secondary | ICD-10-CM | POA: Diagnosis not present

## 2014-09-11 DIAGNOSIS — M7989 Other specified soft tissue disorders: Secondary | ICD-10-CM | POA: Diagnosis not present

## 2014-09-11 DIAGNOSIS — I10 Essential (primary) hypertension: Secondary | ICD-10-CM | POA: Diagnosis not present

## 2014-09-11 DIAGNOSIS — Z794 Long term (current) use of insulin: Secondary | ICD-10-CM | POA: Diagnosis not present

## 2014-09-11 DIAGNOSIS — E119 Type 2 diabetes mellitus without complications: Secondary | ICD-10-CM | POA: Diagnosis not present

## 2014-09-12 DIAGNOSIS — J45909 Unspecified asthma, uncomplicated: Secondary | ICD-10-CM | POA: Diagnosis not present

## 2014-09-12 DIAGNOSIS — M3210 Systemic lupus erythematosus, organ or system involvement unspecified: Secondary | ICD-10-CM | POA: Diagnosis not present

## 2014-09-13 DIAGNOSIS — M3210 Systemic lupus erythematosus, organ or system involvement unspecified: Secondary | ICD-10-CM | POA: Diagnosis not present

## 2014-09-14 DIAGNOSIS — M3210 Systemic lupus erythematosus, organ or system involvement unspecified: Secondary | ICD-10-CM | POA: Diagnosis not present

## 2014-09-15 DIAGNOSIS — M3210 Systemic lupus erythematosus, organ or system involvement unspecified: Secondary | ICD-10-CM | POA: Diagnosis not present

## 2014-09-16 DIAGNOSIS — M3210 Systemic lupus erythematosus, organ or system involvement unspecified: Secondary | ICD-10-CM | POA: Diagnosis not present

## 2014-09-17 ENCOUNTER — Other Ambulatory Visit: Payer: Self-pay | Admitting: Family Medicine

## 2014-09-17 DIAGNOSIS — M3210 Systemic lupus erythematosus, organ or system involvement unspecified: Secondary | ICD-10-CM | POA: Diagnosis not present

## 2014-09-18 DIAGNOSIS — M7989 Other specified soft tissue disorders: Secondary | ICD-10-CM | POA: Diagnosis not present

## 2014-09-18 DIAGNOSIS — M792 Neuralgia and neuritis, unspecified: Secondary | ICD-10-CM | POA: Diagnosis not present

## 2014-09-18 DIAGNOSIS — M7752 Other enthesopathy of left foot: Secondary | ICD-10-CM | POA: Diagnosis not present

## 2014-09-19 DIAGNOSIS — L853 Xerosis cutis: Secondary | ICD-10-CM | POA: Diagnosis not present

## 2014-09-19 DIAGNOSIS — L299 Pruritus, unspecified: Secondary | ICD-10-CM | POA: Diagnosis not present

## 2014-09-19 DIAGNOSIS — L930 Discoid lupus erythematosus: Secondary | ICD-10-CM | POA: Diagnosis not present

## 2014-09-19 DIAGNOSIS — L811 Chloasma: Secondary | ICD-10-CM | POA: Diagnosis not present

## 2014-09-20 DIAGNOSIS — E119 Type 2 diabetes mellitus without complications: Secondary | ICD-10-CM | POA: Diagnosis not present

## 2014-09-22 DIAGNOSIS — E119 Type 2 diabetes mellitus without complications: Secondary | ICD-10-CM | POA: Diagnosis not present

## 2014-09-23 DIAGNOSIS — E119 Type 2 diabetes mellitus without complications: Secondary | ICD-10-CM | POA: Diagnosis not present

## 2014-09-23 DIAGNOSIS — I5042 Chronic combined systolic (congestive) and diastolic (congestive) heart failure: Secondary | ICD-10-CM | POA: Diagnosis not present

## 2014-09-23 DIAGNOSIS — Z7409 Other reduced mobility: Secondary | ICD-10-CM | POA: Diagnosis not present

## 2014-09-23 DIAGNOSIS — N182 Chronic kidney disease, stage 2 (mild): Secondary | ICD-10-CM | POA: Diagnosis not present

## 2014-09-24 DIAGNOSIS — I5042 Chronic combined systolic (congestive) and diastolic (congestive) heart failure: Secondary | ICD-10-CM | POA: Diagnosis not present

## 2014-09-24 DIAGNOSIS — Z7409 Other reduced mobility: Secondary | ICD-10-CM | POA: Diagnosis not present

## 2014-09-24 DIAGNOSIS — E119 Type 2 diabetes mellitus without complications: Secondary | ICD-10-CM | POA: Diagnosis not present

## 2014-09-24 DIAGNOSIS — N182 Chronic kidney disease, stage 2 (mild): Secondary | ICD-10-CM | POA: Diagnosis not present

## 2014-09-25 ENCOUNTER — Encounter: Payer: Self-pay | Admitting: Family Medicine

## 2014-09-25 ENCOUNTER — Ambulatory Visit (INDEPENDENT_AMBULATORY_CARE_PROVIDER_SITE_OTHER): Payer: Medicare Other | Admitting: Family Medicine

## 2014-09-25 VITALS — BP 167/56 | HR 80 | Temp 98.4°F | Ht 64.0 in | Wt 278.1 lb

## 2014-09-25 DIAGNOSIS — I5032 Chronic diastolic (congestive) heart failure: Secondary | ICD-10-CM

## 2014-09-25 DIAGNOSIS — I10 Essential (primary) hypertension: Secondary | ICD-10-CM

## 2014-09-25 DIAGNOSIS — Z7409 Other reduced mobility: Secondary | ICD-10-CM | POA: Diagnosis not present

## 2014-09-25 DIAGNOSIS — E118 Type 2 diabetes mellitus with unspecified complications: Secondary | ICD-10-CM | POA: Diagnosis not present

## 2014-09-25 DIAGNOSIS — I25118 Atherosclerotic heart disease of native coronary artery with other forms of angina pectoris: Secondary | ICD-10-CM | POA: Diagnosis not present

## 2014-09-25 DIAGNOSIS — N182 Chronic kidney disease, stage 2 (mild): Secondary | ICD-10-CM | POA: Diagnosis not present

## 2014-09-25 DIAGNOSIS — I5042 Chronic combined systolic (congestive) and diastolic (congestive) heart failure: Secondary | ICD-10-CM | POA: Diagnosis not present

## 2014-09-25 DIAGNOSIS — L930 Discoid lupus erythematosus: Secondary | ICD-10-CM

## 2014-09-25 DIAGNOSIS — E119 Type 2 diabetes mellitus without complications: Secondary | ICD-10-CM | POA: Diagnosis not present

## 2014-09-25 MED ORDER — LISINOPRIL 5 MG PO TABS: 5.0000 mg | ORAL_TABLET | Freq: Every day | ORAL | Status: DC

## 2014-09-25 MED ORDER — MOMETASONE FUROATE 0.1 % EX OINT: TOPICAL_OINTMENT | Freq: Every day | CUTANEOUS | Status: DC

## 2014-09-25 MED ORDER — NITROGLYCERIN 0.4 MG SL SUBL: 0.4000 mg | SUBLINGUAL_TABLET | SUBLINGUAL | Status: DC | PRN

## 2014-09-25 NOTE — Patient Instructions (Signed)
I want you to come by for blood work one week after you start your new medicine (lisinopril) I sent in refills for your ointment and your nitroglycerin. Have the pharmacy call me with the proper type of test strips. I am delighted that you are feeling better.

## 2014-09-26 DIAGNOSIS — I5042 Chronic combined systolic (congestive) and diastolic (congestive) heart failure: Secondary | ICD-10-CM | POA: Diagnosis not present

## 2014-09-26 DIAGNOSIS — N182 Chronic kidney disease, stage 2 (mild): Secondary | ICD-10-CM | POA: Diagnosis not present

## 2014-09-26 DIAGNOSIS — Z7409 Other reduced mobility: Secondary | ICD-10-CM | POA: Diagnosis not present

## 2014-09-26 DIAGNOSIS — E119 Type 2 diabetes mellitus without complications: Secondary | ICD-10-CM | POA: Diagnosis not present

## 2014-09-26 NOTE — Assessment & Plan Note (Signed)
Add ACE for renal protection

## 2014-09-26 NOTE — Assessment & Plan Note (Signed)
Suspect she could use further diuresis, but go slow, especially since adding ACE.

## 2014-09-26 NOTE — Assessment & Plan Note (Signed)
Add ACE with careful FU of creat given previous AKI

## 2014-09-26 NOTE — Progress Notes (Signed)
   Subjective:    Patient ID: Brittany Roberts, female    DOB: 10-Apr-1941, 74 y.o.   MRN: [id number]  HPI FU dyspnea and leg swelling.  See last note.  She has diuresed 9 lbs and feels better.  Less short of breath.  More energy.  Feels the "fluid is coming out of my body."    Not on ACE because of previous AKI.  Not a compelling indication with diastolic CHF.  However, needs for renal protection  Needs a couple of meds renewed.      Review of Systems     Objective:   Physical Exam BP up and wt down Lungs clear Still 2+ bilateral edema        Assessment & Plan:

## 2014-09-28 DIAGNOSIS — E119 Type 2 diabetes mellitus without complications: Secondary | ICD-10-CM | POA: Diagnosis not present

## 2014-09-29 DIAGNOSIS — E119 Type 2 diabetes mellitus without complications: Secondary | ICD-10-CM | POA: Diagnosis not present

## 2014-10-02 DIAGNOSIS — M216X2 Other acquired deformities of left foot: Secondary | ICD-10-CM | POA: Diagnosis not present

## 2014-10-02 DIAGNOSIS — Z7409 Other reduced mobility: Secondary | ICD-10-CM | POA: Diagnosis not present

## 2014-10-02 DIAGNOSIS — N182 Chronic kidney disease, stage 2 (mild): Secondary | ICD-10-CM | POA: Diagnosis not present

## 2014-10-02 DIAGNOSIS — I5042 Chronic combined systolic (congestive) and diastolic (congestive) heart failure: Secondary | ICD-10-CM | POA: Diagnosis not present

## 2014-10-02 DIAGNOSIS — E119 Type 2 diabetes mellitus without complications: Secondary | ICD-10-CM | POA: Diagnosis not present

## 2014-10-03 DIAGNOSIS — Z7409 Other reduced mobility: Secondary | ICD-10-CM | POA: Diagnosis not present

## 2014-10-03 DIAGNOSIS — N182 Chronic kidney disease, stage 2 (mild): Secondary | ICD-10-CM | POA: Diagnosis not present

## 2014-10-03 DIAGNOSIS — I5042 Chronic combined systolic (congestive) and diastolic (congestive) heart failure: Secondary | ICD-10-CM | POA: Diagnosis not present

## 2014-10-03 DIAGNOSIS — E119 Type 2 diabetes mellitus without complications: Secondary | ICD-10-CM | POA: Diagnosis not present

## 2014-10-05 DIAGNOSIS — N182 Chronic kidney disease, stage 2 (mild): Secondary | ICD-10-CM | POA: Diagnosis not present

## 2014-10-05 DIAGNOSIS — E119 Type 2 diabetes mellitus without complications: Secondary | ICD-10-CM | POA: Diagnosis not present

## 2014-10-05 DIAGNOSIS — Z7409 Other reduced mobility: Secondary | ICD-10-CM | POA: Diagnosis not present

## 2014-10-05 DIAGNOSIS — I5042 Chronic combined systolic (congestive) and diastolic (congestive) heart failure: Secondary | ICD-10-CM | POA: Diagnosis not present

## 2014-10-06 DIAGNOSIS — I5042 Chronic combined systolic (congestive) and diastolic (congestive) heart failure: Secondary | ICD-10-CM | POA: Diagnosis not present

## 2014-10-06 DIAGNOSIS — Z7409 Other reduced mobility: Secondary | ICD-10-CM | POA: Diagnosis not present

## 2014-10-06 DIAGNOSIS — N182 Chronic kidney disease, stage 2 (mild): Secondary | ICD-10-CM | POA: Diagnosis not present

## 2014-10-06 DIAGNOSIS — E119 Type 2 diabetes mellitus without complications: Secondary | ICD-10-CM | POA: Diagnosis not present

## 2014-10-07 DIAGNOSIS — Z7409 Other reduced mobility: Secondary | ICD-10-CM | POA: Diagnosis not present

## 2014-10-07 DIAGNOSIS — I5042 Chronic combined systolic (congestive) and diastolic (congestive) heart failure: Secondary | ICD-10-CM | POA: Diagnosis not present

## 2014-10-07 DIAGNOSIS — N182 Chronic kidney disease, stage 2 (mild): Secondary | ICD-10-CM | POA: Diagnosis not present

## 2014-10-07 DIAGNOSIS — E119 Type 2 diabetes mellitus without complications: Secondary | ICD-10-CM | POA: Diagnosis not present

## 2014-10-08 ENCOUNTER — Other Ambulatory Visit: Payer: Self-pay | Admitting: Family Medicine

## 2014-10-08 DIAGNOSIS — N182 Chronic kidney disease, stage 2 (mild): Secondary | ICD-10-CM | POA: Diagnosis not present

## 2014-10-08 DIAGNOSIS — Z7409 Other reduced mobility: Secondary | ICD-10-CM | POA: Diagnosis not present

## 2014-10-08 DIAGNOSIS — I5042 Chronic combined systolic (congestive) and diastolic (congestive) heart failure: Secondary | ICD-10-CM | POA: Diagnosis not present

## 2014-10-08 DIAGNOSIS — E119 Type 2 diabetes mellitus without complications: Secondary | ICD-10-CM | POA: Diagnosis not present

## 2014-10-09 DIAGNOSIS — N182 Chronic kidney disease, stage 2 (mild): Secondary | ICD-10-CM | POA: Diagnosis not present

## 2014-10-09 DIAGNOSIS — Z7409 Other reduced mobility: Secondary | ICD-10-CM | POA: Diagnosis not present

## 2014-10-09 DIAGNOSIS — I5042 Chronic combined systolic (congestive) and diastolic (congestive) heart failure: Secondary | ICD-10-CM | POA: Diagnosis not present

## 2014-10-09 DIAGNOSIS — E119 Type 2 diabetes mellitus without complications: Secondary | ICD-10-CM | POA: Diagnosis not present

## 2014-10-10 DIAGNOSIS — Z7409 Other reduced mobility: Secondary | ICD-10-CM | POA: Diagnosis not present

## 2014-10-10 DIAGNOSIS — N182 Chronic kidney disease, stage 2 (mild): Secondary | ICD-10-CM | POA: Diagnosis not present

## 2014-10-10 DIAGNOSIS — I5042 Chronic combined systolic (congestive) and diastolic (congestive) heart failure: Secondary | ICD-10-CM | POA: Diagnosis not present

## 2014-10-10 DIAGNOSIS — E119 Type 2 diabetes mellitus without complications: Secondary | ICD-10-CM | POA: Diagnosis not present

## 2014-10-12 DIAGNOSIS — Z7409 Other reduced mobility: Secondary | ICD-10-CM | POA: Diagnosis not present

## 2014-10-12 DIAGNOSIS — I5042 Chronic combined systolic (congestive) and diastolic (congestive) heart failure: Secondary | ICD-10-CM | POA: Diagnosis not present

## 2014-10-12 DIAGNOSIS — E119 Type 2 diabetes mellitus without complications: Secondary | ICD-10-CM | POA: Diagnosis not present

## 2014-10-12 DIAGNOSIS — N182 Chronic kidney disease, stage 2 (mild): Secondary | ICD-10-CM | POA: Diagnosis not present

## 2014-10-13 DIAGNOSIS — Z7409 Other reduced mobility: Secondary | ICD-10-CM | POA: Diagnosis not present

## 2014-10-13 DIAGNOSIS — J45909 Unspecified asthma, uncomplicated: Secondary | ICD-10-CM | POA: Diagnosis not present

## 2014-10-13 DIAGNOSIS — E119 Type 2 diabetes mellitus without complications: Secondary | ICD-10-CM | POA: Diagnosis not present

## 2014-10-13 DIAGNOSIS — I5042 Chronic combined systolic (congestive) and diastolic (congestive) heart failure: Secondary | ICD-10-CM | POA: Diagnosis not present

## 2014-10-13 DIAGNOSIS — N182 Chronic kidney disease, stage 2 (mild): Secondary | ICD-10-CM | POA: Diagnosis not present

## 2014-10-14 DIAGNOSIS — Z7409 Other reduced mobility: Secondary | ICD-10-CM | POA: Diagnosis not present

## 2014-10-14 DIAGNOSIS — E119 Type 2 diabetes mellitus without complications: Secondary | ICD-10-CM | POA: Diagnosis not present

## 2014-10-14 DIAGNOSIS — N182 Chronic kidney disease, stage 2 (mild): Secondary | ICD-10-CM | POA: Diagnosis not present

## 2014-10-14 DIAGNOSIS — I5042 Chronic combined systolic (congestive) and diastolic (congestive) heart failure: Secondary | ICD-10-CM | POA: Diagnosis not present

## 2014-10-17 ENCOUNTER — Telehealth: Payer: Self-pay | Admitting: Family Medicine

## 2014-10-17 DIAGNOSIS — I5042 Chronic combined systolic (congestive) and diastolic (congestive) heart failure: Secondary | ICD-10-CM | POA: Diagnosis not present

## 2014-10-17 DIAGNOSIS — Z7409 Other reduced mobility: Secondary | ICD-10-CM | POA: Diagnosis not present

## 2014-10-17 DIAGNOSIS — N182 Chronic kidney disease, stage 2 (mild): Secondary | ICD-10-CM | POA: Diagnosis not present

## 2014-10-17 DIAGNOSIS — E119 Type 2 diabetes mellitus without complications: Secondary | ICD-10-CM | POA: Diagnosis not present

## 2014-10-17 NOTE — Telephone Encounter (Signed)
Pt called because she is having the same headache that she has had in the past. It is not as bad ad it used to be but still bad. She also thinks that she has a sinus infection. On the left side of her nose is stopped up and very running. Please call to discuss this with the patient. jw

## 2014-10-18 NOTE — Telephone Encounter (Signed)
Spoke with pt 10/17/14 regarding headaches and possible sinus infection.  Pt stated she has been using Nasonex for stuffy nose but when she blows her nose, it has a little blood mixed with it.  Pt also stated she thinks the change in her blood pressure is causing her head to hurt.  Pt advised to normal saline nasal spray instead of the Nasonex.  Pt stated she unable to purchase normal saline spray at this time.  Precept with Dr. [name]; have pt come in clinic for BP check and agree with saline nasal spray.  Pt offered appt for BP check with nurse.  Pt stated she will wait for her appt with PCP 10/25/14 for follow up. Derl Barrow, RN

## 2014-10-19 DIAGNOSIS — Z7409 Other reduced mobility: Secondary | ICD-10-CM | POA: Diagnosis not present

## 2014-10-19 DIAGNOSIS — N182 Chronic kidney disease, stage 2 (mild): Secondary | ICD-10-CM | POA: Diagnosis not present

## 2014-10-19 DIAGNOSIS — I5042 Chronic combined systolic (congestive) and diastolic (congestive) heart failure: Secondary | ICD-10-CM | POA: Diagnosis not present

## 2014-10-19 DIAGNOSIS — E119 Type 2 diabetes mellitus without complications: Secondary | ICD-10-CM | POA: Diagnosis not present

## 2014-10-20 DIAGNOSIS — N182 Chronic kidney disease, stage 2 (mild): Secondary | ICD-10-CM | POA: Diagnosis not present

## 2014-10-20 DIAGNOSIS — I5042 Chronic combined systolic (congestive) and diastolic (congestive) heart failure: Secondary | ICD-10-CM | POA: Diagnosis not present

## 2014-10-20 DIAGNOSIS — Z7409 Other reduced mobility: Secondary | ICD-10-CM | POA: Diagnosis not present

## 2014-10-20 DIAGNOSIS — E119 Type 2 diabetes mellitus without complications: Secondary | ICD-10-CM | POA: Diagnosis not present

## 2014-10-25 ENCOUNTER — Ambulatory Visit (INDEPENDENT_AMBULATORY_CARE_PROVIDER_SITE_OTHER): Payer: Medicare Other | Admitting: Family Medicine

## 2014-10-25 ENCOUNTER — Encounter: Payer: Self-pay | Admitting: Family Medicine

## 2014-10-25 ENCOUNTER — Other Ambulatory Visit: Payer: Self-pay | Admitting: Family Medicine

## 2014-10-25 VITALS — BP 122/50 | HR 72 | Temp 98.4°F | Ht 64.0 in | Wt 279.7 lb

## 2014-10-25 DIAGNOSIS — J329 Chronic sinusitis, unspecified: Secondary | ICD-10-CM | POA: Diagnosis not present

## 2014-10-25 DIAGNOSIS — J324 Chronic pansinusitis: Secondary | ICD-10-CM | POA: Diagnosis not present

## 2014-10-25 DIAGNOSIS — E1140 Type 2 diabetes mellitus with diabetic neuropathy, unspecified: Secondary | ICD-10-CM

## 2014-10-25 DIAGNOSIS — E1149 Type 2 diabetes mellitus with other diabetic neurological complication: Secondary | ICD-10-CM

## 2014-10-25 DIAGNOSIS — F209 Schizophrenia, unspecified: Secondary | ICD-10-CM | POA: Insufficient documentation

## 2014-10-25 LAB — POCT GLYCOSYLATED HEMOGLOBIN (HGB A1C): Hemoglobin A1C: 6.8

## 2014-10-25 MED ORDER — BUSPIRONE HCL 10 MG PO TABS: 10.0000 mg | ORAL_TABLET | Freq: Three times a day (TID) | ORAL | Status: DC

## 2014-10-25 MED ORDER — CLINDAMYCIN HCL 150 MG PO CAPS: 150.0000 mg | ORAL_CAPSULE | Freq: Three times a day (TID) | ORAL | Status: DC

## 2014-10-25 NOTE — Patient Instructions (Signed)
I sent in an antibiotic for your sinus infection and a new nerve pill to try. See me in 1-2 months for a recheck

## 2014-10-25 NOTE — Assessment & Plan Note (Signed)
Symptoms warrant antibiotics.  Will use clinda in pen allergic patient.

## 2014-10-25 NOTE — Assessment & Plan Note (Addendum)
C/O debilitating nervousness.  Does not warrant antipsychotics.  Trial of buspar.

## 2014-10-25 NOTE — Progress Notes (Signed)
   Subjective:    Patient ID: Brittany Roberts, female    DOB: 07/01/1941, 74 y.o.   MRN: [id number]  HPI Patient has chronic sinusitis and has been hospitalized in the past with severe infections C/O one month of worsening facial pressure, foul taste in mouth and purulent to brown nasal discharge and post nasal drip.  No fever.  Some cough. No SOB  Also complains of significant anxiety    Review of Systems     Objective:   Physical ExamTender to percussion of all sinuses - especially left side. Neck no sig nodes Lungs clear.        Assessment & Plan:

## 2014-10-28 DIAGNOSIS — Z7409 Other reduced mobility: Secondary | ICD-10-CM | POA: Diagnosis not present

## 2014-10-28 DIAGNOSIS — E119 Type 2 diabetes mellitus without complications: Secondary | ICD-10-CM | POA: Diagnosis not present

## 2014-10-28 DIAGNOSIS — N182 Chronic kidney disease, stage 2 (mild): Secondary | ICD-10-CM | POA: Diagnosis not present

## 2014-10-28 DIAGNOSIS — I5042 Chronic combined systolic (congestive) and diastolic (congestive) heart failure: Secondary | ICD-10-CM | POA: Diagnosis not present

## 2014-10-29 ENCOUNTER — Ambulatory Visit (INDEPENDENT_AMBULATORY_CARE_PROVIDER_SITE_OTHER): Payer: Medicare Other | Admitting: Neurology

## 2014-10-29 ENCOUNTER — Encounter: Payer: Self-pay | Admitting: Neurology

## 2014-10-29 VITALS — BP 150/60 | HR 91 | Ht 64.0 in | Wt 281.0 lb

## 2014-10-29 DIAGNOSIS — E119 Type 2 diabetes mellitus without complications: Secondary | ICD-10-CM

## 2014-10-29 DIAGNOSIS — Z7409 Other reduced mobility: Secondary | ICD-10-CM | POA: Diagnosis not present

## 2014-10-29 DIAGNOSIS — M329 Systemic lupus erythematosus, unspecified: Secondary | ICD-10-CM | POA: Diagnosis not present

## 2014-10-29 DIAGNOSIS — N182 Chronic kidney disease, stage 2 (mild): Secondary | ICD-10-CM | POA: Diagnosis not present

## 2014-10-29 DIAGNOSIS — G629 Polyneuropathy, unspecified: Secondary | ICD-10-CM | POA: Diagnosis not present

## 2014-10-29 DIAGNOSIS — I5042 Chronic combined systolic (congestive) and diastolic (congestive) heart failure: Secondary | ICD-10-CM | POA: Diagnosis not present

## 2014-10-29 MED ORDER — DULOXETINE HCL 60 MG PO CPEP: 60.0000 mg | ORAL_CAPSULE | Freq: Every day | ORAL | Status: DC

## 2014-10-29 NOTE — Progress Notes (Signed)
PATIENT: Brittany Roberts DOB: Dec 20, 1940  HISTORICAL  Brittany Roberts is a 74 yo RH female is referred by her PCP DR. Hensel and Podiatrist Dr. Barkley Bruns for evaluation of bilateral feet pain.  She has insulin-dependent diabetes for more than 15 years, previous history of lupus, congestive heart failure, obesity, is tearful during today's interview  She noticed gradual onset bilateral feet paresthesia since 2006, gradually getting worse, now has constant burning shooting pain, right worse than left, she was seen by podiatrist in the past, get local injection, mild improvement only,  She also has mild bilateral fingertips paresthesia, low back pain, radiating pain to her right hip, she ambulate with a walker, lives alone at her apartment, need transportation Laboratory in 2016 reviewed, A1c was 6.8, BMP showed elevated creatinine 1.5 She has tried Lyrica in the past, causing swelling of her tongue,  REVIEW OF SYSTEMS: Full 14 system review of systems performed and notable only for fever, chill, weight gain, fatigue, swelling in legs, hearing loss, ringing ears, trouble swallowing, eye pain, shortness of breath, cough, constipations, blood in urine, anemia, feeling cold, flushing, joints pain, joint swelling, achy muscles, allergy, running nose, skin sensitivity, frequent infection, memory loss, confusion, headaches, numbness, slurred speech, difficulty swallowing, restless leg, tremor, depression, anxiety, disinteresting activities  ALLERGIES: Allergies  Allergen Reactions  . Ace Inhibitors     Coincided with sig bump in creat  . Pregabalin     REACTION: swelling  . Ropinirole Hydrochloride     REACTION: swelling  . Amantadines Rash  . Penicillins Swelling and Rash    HOME MEDICATIONS: Current Outpatient Prescriptions  Medication Sig Dispense Refill  . amLODipine (NORVASC) 10 MG tablet Take 1 tablet (10 mg total) by mouth daily. 90 tablet 3  . aspirin EC 81 MG tablet Take 81 mg by mouth  daily.    Marland Kitchen atorvastatin (LIPITOR) 20 MG tablet Take 1 tablet (20 mg total) by mouth daily. 90 tablet 3  . b complex vitamins capsule Take 1 capsule by mouth daily.    . B-D INS SYRINGE 0.5CC/30GX1/2" 30G X 1/2" 0.5 ML MISC use as directed 100 each PRN  . busPIRone (BUSPAR) 10 MG tablet Take 1 tablet (10 mg total) by mouth 3 (three) times daily. 90 tablet 1  . cholecalciferol (VITAMIN D) 1000 UNITS tablet Take 1,000 Units by mouth daily.    . clindamycin (CLEOCIN) 150 MG capsule Take 1 capsule (150 mg total) by mouth 3 (three) times daily. For sinus infection 30 capsule 0  . escitalopram (LEXAPRO) 20 MG tablet take 1 tablet by mouth once daily 90 tablet 3  . FLOVENT HFA 220 MCG/ACT inhaler inhale 1 puff by mouth twice a day 36 g 3  . furosemide (LASIX) 20 MG tablet Take 1 tablet (20 mg total) by mouth every other day. 30 tablet 3  . gabapentin (NEURONTIN) 300 MG capsule Take 1 capsule (300 mg total) by mouth 2 (two) times daily. 60 capsule 11  . glucose blood test strip Check blood sugar three times per day. 300 each 10  . insulin NPH Human (HUMULIN N,NOVOLIN N) 100 UNIT/ML injection Inject 30 Units into the skin every morning.    . isosorbide mononitrate (IMDUR) 60 MG 24 hr tablet take 1 tablet by mouth once daily 90 tablet 3  . lisinopril (PRINIVIL,ZESTRIL) 5 MG tablet Take 1 tablet (5 mg total) by mouth daily. 90 tablet 3  . mometasone (ELOCON) 0.1 % ointment Apply topically daily. 45 g 3  .  nitroGLYCERIN (NITROSTAT) 0.4 MG SL tablet Place 1 tablet (0.4 mg total) under the tongue every 5 (five) minutes as needed for chest pain. 20 tablet 12  . pantoprazole (PROTONIX) 40 MG tablet Take 1 tablet (40 mg total) by mouth 2 (two) times daily. 180 tablet 12  . SYNTHROID 50 MCG tablet take 1 tablet by mouth once daily 90 tablet 3  . traMADol (ULTRAM) 50 MG tablet TAKE 1 TABLET BY MOUTH 3 TIMES DAILY 90 tablet 5  . traZODone (DESYREL) 50 MG tablet take 1 tablet by mouth at bedtime if needed for sleep  90 tablet 3  . VITAMIN A PO Take 1 tablet by mouth daily.      PAST MEDICAL HISTORY: Past Medical History  Diagnosis Date  . Depression   . Lupus   . Asthma   . Diabetes mellitus   . Hypertension   . Hypothyroidism   . Congestive heart failure, unspecified   . Other and unspecified hyperlipidemia   . Unspecified vitamin D deficiency   . Renal failure, unspecified   . History of oxygen administration     oxygen use 2l/m nasally during the night  . Cataracts, both eyes   . OSA (obstructive sleep apnea)     no cpap used  . Peripheral neuropathy     PAST SURGICAL HISTORY: Past Surgical History  Procedure Laterality Date  . Tubal ligation    . Nasal sinus surgery    . Gallbladder surgery    . Carpal tunnel release Bilateral   . Ankle arthroscopy with open reduction internal fixation (orif) Right     2-3 yrs ago-retained screws  . Cholecystectomy    . Colonoscopy N/A 12/10/2013    Procedure: COLONOSCOPY;  Surgeon: Inda Castle, MD;  Location: [facility] ENDOSCOPY;  Service: Endoscopy;  Laterality: N/A;  . Left heart catheterization with coronary angiogram N/A 08/08/2012    Procedure: LEFT HEART CATHETERIZATION WITH CORONARY ANGIOGRAM;  Surgeon: [name], MD;  Location: [facility] CATH LAB;  Service: Cardiovascular;  Laterality: N/A;  . Percutaneous coronary stent intervention (pci-s) N/A 08/21/2012    Procedure: PERCUTANEOUS CORONARY STENT INTERVENTION (PCI-S);  Surgeon: [name], MD;  Location: [facility] CATH LAB;  Service: Cardiovascular;  Laterality: N/A;    FAMILY HISTORY: Family History  Problem Relation Age of Onset  . Heart disease Mother   . Diabetes Mother   . Clotting disorder Mother   . Pneumonia Father   . Rheum arthritis Father   . Diabetes Sister   . Diabetes Sister   . Asthma Brother   . Colon cancer Neg Hx     SOCIAL HISTORY:  History   Social History  . Marital Status: Single    Spouse Name: N/A  . Number of Children: 4  . Years of Education: 12 th    Occupational History  . RetiredMedia planner    Social History Main Topics  . Smoking status: Former Smoker -- 3.00 packs/day for 20 years    Types: Cigarettes    Quit date: 07/06/1999  . Smokeless tobacco: Never Used  . Alcohol Use: No  . Drug Use: No  . Sexual Activity: Not Currently   Other Topics Concern  . Not on file   Social History Narrative   Health Care POA:    Emergency Contact: daughter, Lorenso Courier [phone] or [name] [phone]   End of Life Plan: Does not want to be ventilated or feeding tubes.    Who lives with you: Lives alone  Any pets: none   Diet: Patient reports enjoying and eating junk food.  Does not regulate types of food and currently her dentures are broken so it is hard to eat some foods.   Exercise: Patient does not have an exercise plan.   Seatbelts: Patient reports wearing seatbelt when in vehicle.   Sun Exposure/Protection: Patient reports not going outside very often.   Hobbies: Patient enjoys reading the bible and watching game shows.    Right-handed.   1 cup caffeine per day.        PHYSICAL EXAM   Filed Vitals:   10/29/14 0930  BP: 150/60  Pulse: 91  Height: 5\' 4"  (1.626 m)  Weight: 281 lb (127.461 kg)    Not recorded      Body mass index is 48.21 kg/(m^2).  PHYSICAL EXAMNIATION:  Gen: NAD, conversant, well nourised, obese, well groomed                     Cardiovascular: Regular rate rhythm, no peripheral edema, warm, nontender. Eyes: Conjunctivae clear without exudates or hemorrhage Neck: Supple, no carotid bruise. Pulmonary: Clear to auscultation bilaterally   NEUROLOGICAL EXAM:  MENTAL STATUS: Speech:    Speech is normal; fluent and spontaneous with normal comprehension.  Cognition: Obese, tearful    The patient is oriented to person, place, and time;     recent and remote memory intact;     language fluent;     normal attention, concentration,     fund of knowledge.  CRANIAL NERVES: CN II: Visual  fields are full to confrontation. Fundoscopic exam is normal with sharp discs and no vascular changes. Venous pulsations are present bilaterally. Pupils are 4 mm and briskly reactive to light. Visual acuity is 20/20 bilaterally. CN III, IV, VI: extraocular movement are normal. No ptosis. CN V: Facial sensation is intact to pinprick in all 3 divisions bilaterally. Corneal responses are intact.  CN VII: Face is symmetric with normal eye closure and smile. CN VIII: Hearing is normal to rubbing fingers CN IX, X: Palate elevates symmetrically. Phonation is normal. CN XI: Head turning and shoulder shrug are intact CN XII: Tongue is midline with normal movements and no atrophy.  MOTOR: There is no pronator drift of out-stretched arms. Muscle bulk and tone are normal. Muscle strength is normal.   Shoulder abduction Shoulder external rotation Elbow flexion Elbow extension Wrist flexion Wrist extension Finger abduction Hip flexion Knee flexion Knee extension Ankle dorsi flexion Ankle plantar flexion  R 5 5 5 5 5 5 5 5 5 5 5 5   L 5 5 5 5 5 5 5 5 5 5 5 5     REFLEXES: Reflexes are 2+ and symmetric at the biceps, triceps, absent at bilateral knees, and ankles. Plantar responses are flexor.  SENSORY: Length dependent decreased, pinprick, position sense to above ankle level, and absent toe vibratory sensation, and preserved proprioception at toes.  COORDINATION: Rapid alternating movements and fine finger movements are intact. There is no dysmetria on finger-to-nose and heel-knee-shin. There are no abnormal or extraneous movements.   GAIT/STANCE: She needs to push on chair arm to get up from seated position, wide-based, unsteady, cautious gait   DIAGNOSTIC DATA (LABS, IMAGING, TESTING) - I reviewed patient records, labs, notes, testing and imaging myself where available.  Lab Results  Component Value Date   WBC 13.7* 08/04/2014   HGB 10.9* 08/04/2014   HCT 31.7* 08/04/2014   MCV 71.4*  08/04/2014   PLT 209  08/04/2014      Component Value Date/Time   NA 139 08/07/2014 1624   NA 137 03/12/2014 1331   K 4.8 08/07/2014 1624   CL 106 08/07/2014 1624   CO2 23 08/07/2014 1624   GLUCOSE 96 08/07/2014 1624   GLUCOSE 67 03/12/2014 1331   BUN 25* 08/07/2014 1624   BUN 65* 03/12/2014 1331   CREATININE 1.51* 08/07/2014 1624   CREATININE 1.54* 08/04/2014 0432   CALCIUM 9.0 08/07/2014 1624   PROT 7.7 03/12/2014 1331   PROT 8.5* 02/16/2013 1104   ALBUMIN 3.8 02/16/2013 1104   AST 25 03/12/2014 1331   ALT 16 03/12/2014 1331   ALKPHOS 119* 03/12/2014 1331   BILITOT 0.3 03/12/2014 1331   GFRNONAA 32* 08/04/2014 0432   GFRNONAA 34* 02/16/2013 1104   GFRAA 37* 08/04/2014 0432   GFRAA 40* 02/16/2013 1104   Lab Results  Component Value Date   CHOL 103 07/19/2014   HDL 38* 07/19/2014   LDLCALC 49 07/19/2014   LDLDIRECT 59 02/16/2013   TRIG 81 07/19/2014   CHOLHDL 2.7 07/19/2014   Lab Results  Component Value Date   HGBA1C 6.8 10/25/2014   No results found for: [id number] Lab Results  Component Value Date   TSH 1.049 07/19/2014     ASSESSMENT AND PLAN  LILLAIN SEAVER is a 74 y.o. female with past medical history of obesity, insulin-dependent diabetes, peripheral neuropathy, presenting with whole body achy pain, bilateral feet burning pain, also had a history of lupus,\  1,  she has evidence of diabetic peripheral neuropathy, EMG nerve conduction study 2. Laboratory evaluation including inflammatory markers with her history of lupus 3, Cymbalta 60 mg daily, along with gabapentin 300 mg 3 times a day    Marcial Pacas, M.D. Ph.D.  [facility] [address], [place], [place] [address] Ph: [phone] Fax: [phone]

## 2014-10-30 ENCOUNTER — Telehealth: Payer: Self-pay | Admitting: Neurology

## 2014-10-30 ENCOUNTER — Telehealth: Payer: Self-pay | Admitting: Family Medicine

## 2014-10-30 DIAGNOSIS — N182 Chronic kidney disease, stage 2 (mild): Secondary | ICD-10-CM | POA: Diagnosis not present

## 2014-10-30 DIAGNOSIS — D508 Other iron deficiency anemias: Secondary | ICD-10-CM

## 2014-10-30 DIAGNOSIS — Z7409 Other reduced mobility: Secondary | ICD-10-CM | POA: Diagnosis not present

## 2014-10-30 DIAGNOSIS — I5042 Chronic combined systolic (congestive) and diastolic (congestive) heart failure: Secondary | ICD-10-CM | POA: Diagnosis not present

## 2014-10-30 DIAGNOSIS — E119 Type 2 diabetes mellitus without complications: Secondary | ICD-10-CM | POA: Diagnosis not present

## 2014-10-30 LAB — C-REACTIVE PROTEIN: CRP: 7 mg/L — ABNORMAL HIGH (ref 0.0–4.9)

## 2014-10-30 NOTE — Telephone Encounter (Signed)
Pt called and thinks that she is having an allergic reaction to her medication, busPIRone (BUSPAR) 10 MG tablet. Additionally, she was neurologist yesterday and they told her that she is "anemic" because her hemoglobin is low and "something else about her thyroid". She would like to speak to Dr. [name] / thanks Fonda Kinder, ASA

## 2014-10-30 NOTE — Telephone Encounter (Signed)
Please call patient, multiple abnormal laboratory, Elevated creatinine, about at her baseline, Anemia, hemoglobin 10.7 Low TSH, could indicate over supplement of her thyroid hormone, she should contact her primary care physician for further evaluation

## 2014-10-30 NOTE — Telephone Encounter (Signed)
Patient aware of abnormal results.  She verbalized understanding to call her PCP for an appointment for further discussion of treatment needs.

## 2014-10-31 ENCOUNTER — Telehealth: Payer: Self-pay | Admitting: Neurology

## 2014-10-31 DIAGNOSIS — Z9861 Coronary angioplasty status: Secondary | ICD-10-CM | POA: Diagnosis not present

## 2014-10-31 DIAGNOSIS — I2510 Atherosclerotic heart disease of native coronary artery without angina pectoris: Secondary | ICD-10-CM | POA: Diagnosis not present

## 2014-10-31 DIAGNOSIS — I10 Essential (primary) hypertension: Secondary | ICD-10-CM | POA: Diagnosis not present

## 2014-10-31 DIAGNOSIS — E780 Pure hypercholesterolemia: Secondary | ICD-10-CM | POA: Diagnosis not present

## 2014-10-31 MED ORDER — FERROUS SULFATE 325 (65 FE) MG PO TABS: 325.0000 mg | ORAL_TABLET | Freq: Every day | ORAL | Status: DC

## 2014-10-31 NOTE — Telephone Encounter (Signed)
Discussed.  She will also make an appointment.

## 2014-10-31 NOTE — Telephone Encounter (Signed)
error 

## 2014-11-01 ENCOUNTER — Telehealth: Payer: Self-pay | Admitting: Family Medicine

## 2014-11-01 DIAGNOSIS — N182 Chronic kidney disease, stage 2 (mild): Secondary | ICD-10-CM | POA: Diagnosis not present

## 2014-11-01 DIAGNOSIS — E119 Type 2 diabetes mellitus without complications: Secondary | ICD-10-CM | POA: Diagnosis not present

## 2014-11-01 DIAGNOSIS — Z7409 Other reduced mobility: Secondary | ICD-10-CM | POA: Diagnosis not present

## 2014-11-01 DIAGNOSIS — I5042 Chronic combined systolic (congestive) and diastolic (congestive) heart failure: Secondary | ICD-10-CM | POA: Diagnosis not present

## 2014-11-01 NOTE — Telephone Encounter (Signed)
I have called paite

## 2014-11-01 NOTE — Telephone Encounter (Signed)
Wanted to inform PCP of results from her neurology appt; her PSH was low, her thyroid was abnormal "oversupplement of thyroid hormones"; would like for PCP to call her so she can give other notes / thanks [employer], ASA

## 2014-11-01 NOTE — Telephone Encounter (Signed)
I have already spoken to the patient about this.

## 2014-11-02 ENCOUNTER — Other Ambulatory Visit: Payer: Self-pay | Admitting: Family Medicine

## 2014-11-02 DIAGNOSIS — N182 Chronic kidney disease, stage 2 (mild): Secondary | ICD-10-CM | POA: Diagnosis not present

## 2014-11-02 DIAGNOSIS — Z7409 Other reduced mobility: Secondary | ICD-10-CM | POA: Diagnosis not present

## 2014-11-02 DIAGNOSIS — E119 Type 2 diabetes mellitus without complications: Secondary | ICD-10-CM | POA: Diagnosis not present

## 2014-11-02 DIAGNOSIS — I5042 Chronic combined systolic (congestive) and diastolic (congestive) heart failure: Secondary | ICD-10-CM | POA: Diagnosis not present

## 2014-11-03 DIAGNOSIS — E119 Type 2 diabetes mellitus without complications: Secondary | ICD-10-CM | POA: Diagnosis not present

## 2014-11-03 DIAGNOSIS — I5042 Chronic combined systolic (congestive) and diastolic (congestive) heart failure: Secondary | ICD-10-CM | POA: Diagnosis not present

## 2014-11-03 DIAGNOSIS — Z7409 Other reduced mobility: Secondary | ICD-10-CM | POA: Diagnosis not present

## 2014-11-03 DIAGNOSIS — N182 Chronic kidney disease, stage 2 (mild): Secondary | ICD-10-CM | POA: Diagnosis not present

## 2014-11-03 LAB — CBC
Hematocrit: 33 % — ABNORMAL LOW (ref 34.0–46.6)
Hemoglobin: 10.7 g/dL — ABNORMAL LOW (ref 11.1–15.9)
MCH: 24.5 pg — ABNORMAL LOW (ref 26.6–33.0)
MCHC: 32.4 g/dL (ref 31.5–35.7)
MCV: 76 fL — ABNORMAL LOW (ref 79–97)
Platelets: 229 10*3/uL (ref 150–379)
RBC: 4.37 x10E6/uL (ref 3.77–5.28)
RDW: 15 % (ref 12.3–15.4)
WBC: 9 10*3/uL (ref 3.4–10.8)

## 2014-11-03 LAB — COMPREHENSIVE METABOLIC PANEL
ALT: 19 IU/L (ref 0–32)
AST: 26 IU/L (ref 0–40)
Albumin/Globulin Ratio: 1 — ABNORMAL LOW (ref 1.1–2.5)
Albumin: 3.7 g/dL (ref 3.5–4.8)
Alkaline Phosphatase: 132 IU/L — ABNORMAL HIGH (ref 39–117)
BUN/Creatinine Ratio: 18 (ref 11–26)
BUN: 34 mg/dL — ABNORMAL HIGH (ref 8–27)
Bilirubin Total: 0.3 mg/dL (ref 0.0–1.2)
CO2: 20 mmol/L (ref 18–29)
Calcium: 9.1 mg/dL (ref 8.7–10.3)
Chloride: 103 mmol/L (ref 97–108)
Creatinine, Ser: 1.86 mg/dL — ABNORMAL HIGH (ref 0.57–1.00)
GFR calc Af Amer: 30 mL/min/{1.73_m2} — ABNORMAL LOW (ref >59)
GFR calc non Af Amer: 26 mL/min/{1.73_m2} — ABNORMAL LOW (ref >59)
Globulin, Total: 3.7 g/dL (ref 1.5–4.5)
Glucose: 146 mg/dL — ABNORMAL HIGH (ref 65–99)
Potassium: 4.3 mmol/L (ref 3.5–5.2)
Sodium: 140 mmol/L (ref 134–144)
Total Protein: 7.4 g/dL (ref 6.0–8.5)

## 2014-11-03 LAB — CK: Total CK: 94 U/L (ref 24–173)

## 2014-11-03 LAB — RPR: RPR Ser Ql: NONREACTIVE

## 2014-11-03 LAB — VITAMIN D 1,25 DIHYDROXY
Vitamin D 1, 25 (OH)2 Total: 49 pg/mL
Vitamin D2 1, 25 (OH)2: <10 pg/mL
Vitamin D3 1, 25 (OH)2: 48 pg/mL

## 2014-11-03 LAB — VITAMIN B12: Vitamin B-12: 926 pg/mL (ref 211–946)

## 2014-11-03 LAB — VITAMIN B1: Thiamine: 91.8 nmol/L (ref 66.5–200.0)

## 2014-11-03 LAB — C-REACTIVE PROTEIN: CRP: 7 mg/L — ABNORMAL HIGH (ref 0.0–4.9)

## 2014-11-03 LAB — THYROID PANEL WITH TSH
Free Thyroxine Index: 2.7 (ref 1.2–4.9)
T3 Uptake Ratio: 29 % (ref 24–39)
T4, Total: 9.2 ug/dL (ref 4.5–12.0)
TSH: 0.207 u[IU]/mL — ABNORMAL LOW (ref 0.450–4.500)

## 2014-11-03 LAB — FOLATE: Folate: 10 ng/mL (ref >3.0)

## 2014-11-03 LAB — SEDIMENTATION RATE: Sed Rate: 50 mm/hr — ABNORMAL HIGH (ref 0–40)

## 2014-11-04 DIAGNOSIS — N182 Chronic kidney disease, stage 2 (mild): Secondary | ICD-10-CM | POA: Diagnosis not present

## 2014-11-04 DIAGNOSIS — E119 Type 2 diabetes mellitus without complications: Secondary | ICD-10-CM | POA: Diagnosis not present

## 2014-11-04 DIAGNOSIS — Z7409 Other reduced mobility: Secondary | ICD-10-CM | POA: Diagnosis not present

## 2014-11-04 DIAGNOSIS — I5042 Chronic combined systolic (congestive) and diastolic (congestive) heart failure: Secondary | ICD-10-CM | POA: Diagnosis not present

## 2014-11-05 ENCOUNTER — Ambulatory Visit (INDEPENDENT_AMBULATORY_CARE_PROVIDER_SITE_OTHER): Payer: Medicare Other | Admitting: Neurology

## 2014-11-05 DIAGNOSIS — E119 Type 2 diabetes mellitus without complications: Secondary | ICD-10-CM

## 2014-11-05 DIAGNOSIS — G2401 Drug induced subacute dyskinesia: Secondary | ICD-10-CM | POA: Diagnosis not present

## 2014-11-05 DIAGNOSIS — G629 Polyneuropathy, unspecified: Secondary | ICD-10-CM

## 2014-11-05 DIAGNOSIS — Z7409 Other reduced mobility: Secondary | ICD-10-CM | POA: Diagnosis not present

## 2014-11-05 DIAGNOSIS — I5042 Chronic combined systolic (congestive) and diastolic (congestive) heart failure: Secondary | ICD-10-CM | POA: Diagnosis not present

## 2014-11-05 DIAGNOSIS — Z794 Long term (current) use of insulin: Secondary | ICD-10-CM

## 2014-11-05 DIAGNOSIS — M329 Systemic lupus erythematosus, unspecified: Secondary | ICD-10-CM

## 2014-11-05 DIAGNOSIS — IMO0002 Reserved for concepts with insufficient information to code with codable children: Secondary | ICD-10-CM

## 2014-11-05 DIAGNOSIS — N182 Chronic kidney disease, stage 2 (mild): Secondary | ICD-10-CM | POA: Diagnosis not present

## 2014-11-05 NOTE — Progress Notes (Signed)
PATIENT: Brittany Roberts DOB: 1940-10-25  HISTORICAL  Brittany Roberts is a 74 yo RH female is referred by her PCP DR. Hensel and Podiatrist Dr. Barkley Bruns for evaluation of bilateral feet pain.  She has insulin-dependent diabetes for more than 15 years, previous history of lupus, congestive heart failure, obesity, is tearful during today's interview  She noticed gradual onset bilateral feet paresthesia since 2006, gradually getting worse, now has constant burning shooting pain, right worse than left, she was seen by podiatrist in the past, get local injection, mild improvement only,  She also has mild bilateral fingertips paresthesia, low back pain, radiating pain to her right hip, she ambulate with a walker, lives alone at her apartment, need transportation Laboratory in 2016 reviewed, A1c was 6.8, BMP showed elevated creatinine 1.5 She has tried Lyrica in the past, causing swelling of her tongue,  UPDATE May 3rd 2016: Patient came in today for electrodiagnostic study, there was evidence of mild axonal peripheral neuropathy She also complains of worsening low back pain, right hip pain, radiating pain from right lower back to right lower extremity, gait difficulty, We have reviewed laboratory evaluations, low TSH, indicating hyperthyroidism, she is on thyroid supplement, mild elevated C reactive protein, otherwise normal CPK, B12,  REVIEW OF SYSTEMS: Full 14 system review of systems performed and notable only for as above  ALLERGIES: Allergies  Allergen Reactions  . Ace Inhibitors     Coincided with sig bump in creat  . Buspar [Buspirone]     pain  . Pregabalin     REACTION: swelling  . Ropinirole Hydrochloride     REACTION: swelling  . Amantadines Rash  . Penicillins Swelling and Rash    HOME MEDICATIONS: Current Outpatient Prescriptions  Medication Sig Dispense Refill  . amLODipine (NORVASC) 10 MG tablet Take 1 tablet (10 mg total) by mouth daily. 90 tablet 3  . aspirin EC 81 MG  tablet Take 81 mg by mouth daily.    Marland Kitchen atorvastatin (LIPITOR) 20 MG tablet Take 1 tablet (20 mg total) by mouth daily. 90 tablet 3  . b complex vitamins capsule Take 1 capsule by mouth daily.    . B-D INS SYRINGE 0.5CC/30GX1/2" 30G X 1/2" 0.5 ML MISC use as directed 100 each PRN  . busPIRone (BUSPAR) 10 MG tablet Take 1 tablet (10 mg total) by mouth 3 (three) times daily. 90 tablet 1  . cholecalciferol (VITAMIN D) 1000 UNITS tablet Take 1,000 Units by mouth daily.    . clindamycin (CLEOCIN) 150 MG capsule Take 1 capsule (150 mg total) by mouth 3 (three) times daily. For sinus infection 30 capsule 0  . escitalopram (LEXAPRO) 20 MG tablet take 1 tablet by mouth once daily 90 tablet 3  . FLOVENT HFA 220 MCG/ACT inhaler inhale 1 puff by mouth twice a day 36 g 3  . furosemide (LASIX) 20 MG tablet Take 1 tablet (20 mg total) by mouth every other day. 30 tablet 3  . gabapentin (NEURONTIN) 300 MG capsule Take 1 capsule (300 mg total) by mouth 2 (two) times daily. 60 capsule 11  . glucose blood test strip Check blood sugar three times per day. 300 each 10  . insulin NPH Human (HUMULIN N,NOVOLIN N) 100 UNIT/ML injection Inject 30 Units into the skin every morning.    . isosorbide mononitrate (IMDUR) 60 MG 24 hr tablet take 1 tablet by mouth once daily 90 tablet 3  . lisinopril (PRINIVIL,ZESTRIL) 5 MG tablet Take 1 tablet (5 mg  total) by mouth daily. 90 tablet 3  . mometasone (ELOCON) 0.1 % ointment Apply topically daily. 45 g 3  . nitroGLYCERIN (NITROSTAT) 0.4 MG SL tablet Place 1 tablet (0.4 mg total) under the tongue every 5 (five) minutes as needed for chest pain. 20 tablet 12  . pantoprazole (PROTONIX) 40 MG tablet Take 1 tablet (40 mg total) by mouth 2 (two) times daily. 180 tablet 12  . SYNTHROID 50 MCG tablet take 1 tablet by mouth once daily 90 tablet 3  . traMADol (ULTRAM) 50 MG tablet TAKE 1 TABLET BY MOUTH 3 TIMES DAILY 90 tablet 5  . traZODone (DESYREL) 50 MG tablet take 1 tablet by mouth at  bedtime if needed for sleep 90 tablet 3  . VITAMIN A PO Take 1 tablet by mouth daily.      PAST MEDICAL HISTORY: Past Medical History  Diagnosis Date  . Depression   . Lupus   . Asthma   . Diabetes mellitus   . Hypertension   . Hypothyroidism   . Congestive heart failure, unspecified   . Other and unspecified hyperlipidemia   . Unspecified vitamin D deficiency   . Renal failure, unspecified   . History of oxygen administration     oxygen use 2l/m nasally during the night  . Cataracts, both eyes   . OSA (obstructive sleep apnea)     no cpap used  . Peripheral neuropathy     PAST SURGICAL HISTORY: Past Surgical History  Procedure Laterality Date  . Tubal ligation    . Nasal sinus surgery    . Gallbladder surgery    . Carpal tunnel release Bilateral   . Ankle arthroscopy with open reduction internal fixation (orif) Right     2-3 yrs ago-retained screws  . Cholecystectomy    . Colonoscopy N/A 12/10/2013    Procedure: COLONOSCOPY;  Surgeon: Inda Castle, MD;  Location: [facility] ENDOSCOPY;  Service: Endoscopy;  Laterality: N/A;  . Left heart catheterization with coronary angiogram N/A 08/08/2012    Procedure: LEFT HEART CATHETERIZATION WITH CORONARY ANGIOGRAM;  Surgeon: [name], MD;  Location: [facility] CATH LAB;  Service: Cardiovascular;  Laterality: N/A;  . Percutaneous coronary stent intervention (pci-s) N/A 08/21/2012    Procedure: PERCUTANEOUS CORONARY STENT INTERVENTION (PCI-S);  Surgeon: [name], MD;  Location: [facility] C[facility] LAB;  Service: Cardiovascular;  Laterality: N/A;    FAMILY HISTORY: Family History  Problem Relation Age of Onset  . Heart disease Mother   . Diabetes Mother   . Clotting disorder Mother   . Pneumonia Father   . Rheum arthritis Father   . Diabetes Sister   . Diabetes Sister   . Asthma Brother   . Colon cancer Neg Hx     SOCIAL HISTORY:  History   Social History  . Marital Status: Single    Spouse Name: N/A  . Number of Children: 4    . Years of Education: 12 th   Occupational History  . RetiredMedia planner    Social History Main Topics  . Smoking status: Former Smoker -- 3.00 packs/day for 20 years    Types: Cigarettes    Quit date: 07/06/1999  . Smokeless tobacco: Never Used  . Alcohol Use: No  . Drug Use: No  . Sexual Activity: Not Currently   Other Topics Concern  . Not on file   Social History Narrative   Health Care POA:    Emergency Contact: daughter, Lorenso Courier [phone] or [name] [phone]  End of Life Plan: Does not want to be ventilated or feeding tubes.    Who lives with you: Lives alone   Any pets: none   Diet: Patient reports enjoying and eating junk food.  Does not regulate types of food and currently her dentures are broken so it is hard to eat some foods.   Exercise: Patient does not have an exercise plan.   Seatbelts: Patient reports wearing seatbelt when in vehicle.   Sun Exposure/Protection: Patient reports not going outside very often.   Hobbies: Patient enjoys reading the bible and watching game shows.    Right-handed.   1 cup caffeine per day.        PHYSICAL EXAM   There were no vitals filed for this visit.  Not recorded      There is no weight on file to calculate BMI.  PHYSICAL EXAMNIATION:  Gen: NAD, conversant, well nourised, obese, well groomed                     Cardiovascular: Regular rate rhythm, no peripheral edema, warm, nontender. Eyes: Conjunctivae clear without exudates or hemorrhage Neck: Supple, no carotid bruise. Pulmonary: Clear to auscultation bilaterally   NEUROLOGICAL EXAM:  MENTAL STATUS: Speech:    Speech is normal; fluent and spontaneous with normal comprehension.  Cognition: Obese, tearful    The patient is oriented to person, place, and time;     recent and remote memory intact;     language fluent;     normal attention, concentration,     fund of knowledge.  CRANIAL NERVES: CN II: Visual fields are full to confrontation.  Fundoscopic exam is normal with sharp discs and no vascular changes. Venous pulsations are present bilaterally. Pupils are 4 mm and briskly reactive to light. Visual acuity is 20/20 bilaterally. CN III, IV, VI: extraocular movement are normal. No ptosis. CN V: Facial sensation is intact to pinprick in all 3 divisions bilaterally. Corneal responses are intact.  CN VII: Face is symmetric with normal eye closure and smile. CN VIII: Hearing is normal to rubbing fingers CN IX, X: Palate elevates symmetrically. Phonation is normal. CN XI: Head turning and shoulder shrug are intact CN XII: Tongue is midline with normal movements and no atrophy.  MOTOR: There is no pronator drift of out-stretched arms. Muscle bulk and tone are normal. Muscle strength is normal.   Shoulder abduction Shoulder external rotation Elbow flexion Elbow extension Wrist flexion Wrist extension Finger abduction Hip flexion Knee flexion Knee extension Ankle dorsi flexion Ankle plantar flexion  R 5 5 5 5 5 5 5 5 5 5 5 5   L 5 5 5 5 5 5 5 5 5 5 5 5     REFLEXES: Reflexes are 2+ and symmetric at the biceps, triceps, absent at bilateral knees, and ankles. Plantar responses are flexor.  SENSORY: Length dependent decreased, pinprick, position sense to above ankle level, and absent toe vibratory sensation, and preserved proprioception at toes.  COORDINATION: Rapid alternating movements and fine finger movements are intact. There is no dysmetria on finger-to-nose and heel-knee-shin. There are no abnormal or extraneous movements.   GAIT/STANCE: She needs to push on chair arm to get up from seated position, wide-based, unsteady, cautious gait   DIAGNOSTIC DATA (LABS, IMAGING, TESTING) - I reviewed patient records, labs, notes, testing and imaging myself where available.  Lab Results  Component Value Date   WBC 9.0 10/29/2014   HGB 10.9* 08/04/2014   HCT 33.0* 10/29/2014  MCV 71.4* 08/04/2014   PLT 209 08/04/2014        Component Value Date/Time   NA 140 10/29/2014 1100   NA 139 08/07/2014 1624   K 4.3 10/29/2014 1100   CL 103 10/29/2014 1100   CO2 20 10/29/2014 1100   GLUCOSE 146* 10/29/2014 1100   GLUCOSE 96 08/07/2014 1624   BUN 34* 10/29/2014 1100   BUN 25* 08/07/2014 1624   CREATININE 1.86* 10/29/2014 1100   CREATININE 1.51* 08/07/2014 1624   CALCIUM 9.1 10/29/2014 1100   PROT 7.4 10/29/2014 1100   PROT 8.5* 02/16/2013 1104   ALBUMIN 3.8 02/16/2013 1104   AST 26 10/29/2014 1100   ALT 19 10/29/2014 1100   ALKPHOS 132* 10/29/2014 1100   BILITOT 0.3 10/29/2014 1100   BILITOT 0.3 03/12/2014 1331   GFRNONAA 26* 10/29/2014 1100   GFRNONAA 34* 02/16/2013 1104   GFRAA 30* 10/29/2014 1100   GFRAA 40* 02/16/2013 1104   Lab Results  Component Value Date   CHOL 103 07/19/2014   HDL 38* 07/19/2014   LDLCALC 49 07/19/2014   LDLDIRECT 59 02/16/2013   TRIG 81 07/19/2014   CHOLHDL 2.7 07/19/2014   Lab Results  Component Value Date   HGBA1C 6.8 10/25/2014   Lab Results  Component Value Date   VITAMINB12 926 10/29/2014   Lab Results  Component Value Date   TSH 0.207* 10/29/2014     ASSESSMENT AND PLAN  Brittany Roberts is a 74 y.o. female with past medical history of obesity, insulin-dependent diabetes, peripheral neuropathy, presenting with whole body achy pain, bilateral feet burning pain, also had a history of lupus,  Her gait difficulty are likely combination of aging, deconditioning, obesity, there was also electrodiagnostic evidence of mild peripheral neuropathy, She complains of low back pain, right hip pain, we will proceed with x-ray of lumbar, right hip, Return to clinic in 2 months, if there is significant pathology on x-ray, may consider MRIs    Marcial Pacas, M.D. Ph.D.  [facility] [address], [place], [place] [address] Ph: [phone] Fax: [phone]

## 2014-11-05 NOTE — Procedures (Signed)
   NCS (NERVE CONDUCTION STUDY) WITH EMG (ELECTROMYOGRAPHY) REPORT   STUDY DATE: Nov 05 2014 PATIENT NAME: Brittany Roberts DOB: 1941-05-19 MRN: [id number]    TECHNOLOGIST: Laretta Alstrom ELECTROMYOGRAPHER: Marcial Pacas M.D.  CLINICAL INFORMATION:  74 years old female, with past medical history of obesity, presenting with low back pain, gait difficulty,  FINDINGS: NERVE CONDUCTION STUDY: Bilateral peroneal sensory responses were absent. Bilateral peroneal to EDB, and left tibial motor responses were normal. Right tibial motor responses showed mild to moderately decreased to C map amplitude, with preserved conduction velocity. Bilateral tibial H reflexes were absent.  Bilateral ulnar sensory and motor responses were normal. Bilateral median sensory responses showed mildly prolonged peak latency, with preserved snap amplitude.  Bilateral median motor responses showed mildly prolonged distal latency, with normal C map amplitude, conduction velocity, and F-wave latency.    NEEDLE ELECTROMYOGRAPHY: Selected needle examination was performed at right lower extremity muscles, right lumbar sacral paraspinal muscles.   Selected needle examination of right tibialis anterior, tibialis posterior, vastus lateralis, biceps femoris short head, biceps femoris long head was normal.  There was no spontaneous activity at right lumbar sacral paraspinal muscles, right L4, L5, S1.  IMPRESSION:   This is a mild abnormal study, there was electrodiagnostic evidence of mild axonal peripheral neuropathy. There was no evidence of right lumbosacral radiculopathy.   INTERPRETING PHYSICIAN:   Marcial Pacas M.D. Ph.D. [facility] [address], [place], [place] [address] [phone]

## 2014-11-06 DIAGNOSIS — I5042 Chronic combined systolic (congestive) and diastolic (congestive) heart failure: Secondary | ICD-10-CM | POA: Diagnosis not present

## 2014-11-06 DIAGNOSIS — Z7409 Other reduced mobility: Secondary | ICD-10-CM | POA: Diagnosis not present

## 2014-11-06 DIAGNOSIS — N182 Chronic kidney disease, stage 2 (mild): Secondary | ICD-10-CM | POA: Diagnosis not present

## 2014-11-06 DIAGNOSIS — E119 Type 2 diabetes mellitus without complications: Secondary | ICD-10-CM | POA: Diagnosis not present

## 2014-11-07 DIAGNOSIS — Z7409 Other reduced mobility: Secondary | ICD-10-CM | POA: Diagnosis not present

## 2014-11-07 DIAGNOSIS — E119 Type 2 diabetes mellitus without complications: Secondary | ICD-10-CM | POA: Diagnosis not present

## 2014-11-07 DIAGNOSIS — N182 Chronic kidney disease, stage 2 (mild): Secondary | ICD-10-CM | POA: Diagnosis not present

## 2014-11-07 DIAGNOSIS — I5042 Chronic combined systolic (congestive) and diastolic (congestive) heart failure: Secondary | ICD-10-CM | POA: Diagnosis not present

## 2014-11-08 DIAGNOSIS — E119 Type 2 diabetes mellitus without complications: Secondary | ICD-10-CM | POA: Diagnosis not present

## 2014-11-08 DIAGNOSIS — N182 Chronic kidney disease, stage 2 (mild): Secondary | ICD-10-CM | POA: Diagnosis not present

## 2014-11-08 DIAGNOSIS — Z7409 Other reduced mobility: Secondary | ICD-10-CM | POA: Diagnosis not present

## 2014-11-08 DIAGNOSIS — I5042 Chronic combined systolic (congestive) and diastolic (congestive) heart failure: Secondary | ICD-10-CM | POA: Diagnosis not present

## 2014-11-11 DIAGNOSIS — I5042 Chronic combined systolic (congestive) and diastolic (congestive) heart failure: Secondary | ICD-10-CM | POA: Diagnosis not present

## 2014-11-11 DIAGNOSIS — I10 Essential (primary) hypertension: Secondary | ICD-10-CM | POA: Diagnosis not present

## 2014-11-11 DIAGNOSIS — E119 Type 2 diabetes mellitus without complications: Secondary | ICD-10-CM | POA: Diagnosis not present

## 2014-11-11 DIAGNOSIS — Z7409 Other reduced mobility: Secondary | ICD-10-CM | POA: Diagnosis not present

## 2014-11-11 DIAGNOSIS — N182 Chronic kidney disease, stage 2 (mild): Secondary | ICD-10-CM | POA: Diagnosis not present

## 2014-11-12 DIAGNOSIS — E119 Type 2 diabetes mellitus without complications: Secondary | ICD-10-CM | POA: Diagnosis not present

## 2014-11-12 DIAGNOSIS — J45909 Unspecified asthma, uncomplicated: Secondary | ICD-10-CM | POA: Diagnosis not present

## 2014-11-12 DIAGNOSIS — Z7409 Other reduced mobility: Secondary | ICD-10-CM | POA: Diagnosis not present

## 2014-11-12 DIAGNOSIS — I5042 Chronic combined systolic (congestive) and diastolic (congestive) heart failure: Secondary | ICD-10-CM | POA: Diagnosis not present

## 2014-11-12 DIAGNOSIS — N182 Chronic kidney disease, stage 2 (mild): Secondary | ICD-10-CM | POA: Diagnosis not present

## 2014-11-13 DIAGNOSIS — N182 Chronic kidney disease, stage 2 (mild): Secondary | ICD-10-CM | POA: Diagnosis not present

## 2014-11-13 DIAGNOSIS — Z7409 Other reduced mobility: Secondary | ICD-10-CM | POA: Diagnosis not present

## 2014-11-13 DIAGNOSIS — I5042 Chronic combined systolic (congestive) and diastolic (congestive) heart failure: Secondary | ICD-10-CM | POA: Diagnosis not present

## 2014-11-13 DIAGNOSIS — E119 Type 2 diabetes mellitus without complications: Secondary | ICD-10-CM | POA: Diagnosis not present

## 2014-11-14 DIAGNOSIS — N182 Chronic kidney disease, stage 2 (mild): Secondary | ICD-10-CM | POA: Diagnosis not present

## 2014-11-14 DIAGNOSIS — I5042 Chronic combined systolic (congestive) and diastolic (congestive) heart failure: Secondary | ICD-10-CM | POA: Diagnosis not present

## 2014-11-14 DIAGNOSIS — E119 Type 2 diabetes mellitus without complications: Secondary | ICD-10-CM | POA: Diagnosis not present

## 2014-11-14 DIAGNOSIS — Z7409 Other reduced mobility: Secondary | ICD-10-CM | POA: Diagnosis not present

## 2014-11-15 DIAGNOSIS — I5042 Chronic combined systolic (congestive) and diastolic (congestive) heart failure: Secondary | ICD-10-CM | POA: Diagnosis not present

## 2014-11-15 DIAGNOSIS — Z7409 Other reduced mobility: Secondary | ICD-10-CM | POA: Diagnosis not present

## 2014-11-15 DIAGNOSIS — N182 Chronic kidney disease, stage 2 (mild): Secondary | ICD-10-CM | POA: Diagnosis not present

## 2014-11-15 DIAGNOSIS — E119 Type 2 diabetes mellitus without complications: Secondary | ICD-10-CM | POA: Diagnosis not present

## 2014-11-18 DIAGNOSIS — E119 Type 2 diabetes mellitus without complications: Secondary | ICD-10-CM | POA: Diagnosis not present

## 2014-11-18 DIAGNOSIS — N182 Chronic kidney disease, stage 2 (mild): Secondary | ICD-10-CM | POA: Diagnosis not present

## 2014-11-18 DIAGNOSIS — Z7409 Other reduced mobility: Secondary | ICD-10-CM | POA: Diagnosis not present

## 2014-11-18 DIAGNOSIS — I5042 Chronic combined systolic (congestive) and diastolic (congestive) heart failure: Secondary | ICD-10-CM | POA: Diagnosis not present

## 2014-11-19 DIAGNOSIS — I5042 Chronic combined systolic (congestive) and diastolic (congestive) heart failure: Secondary | ICD-10-CM | POA: Diagnosis not present

## 2014-11-19 DIAGNOSIS — Z7409 Other reduced mobility: Secondary | ICD-10-CM | POA: Diagnosis not present

## 2014-11-19 DIAGNOSIS — E119 Type 2 diabetes mellitus without complications: Secondary | ICD-10-CM | POA: Diagnosis not present

## 2014-11-19 DIAGNOSIS — N182 Chronic kidney disease, stage 2 (mild): Secondary | ICD-10-CM | POA: Diagnosis not present

## 2014-11-20 DIAGNOSIS — N182 Chronic kidney disease, stage 2 (mild): Secondary | ICD-10-CM | POA: Diagnosis not present

## 2014-11-20 DIAGNOSIS — Z7409 Other reduced mobility: Secondary | ICD-10-CM | POA: Diagnosis not present

## 2014-11-20 DIAGNOSIS — E119 Type 2 diabetes mellitus without complications: Secondary | ICD-10-CM | POA: Diagnosis not present

## 2014-11-20 DIAGNOSIS — I5042 Chronic combined systolic (congestive) and diastolic (congestive) heart failure: Secondary | ICD-10-CM | POA: Diagnosis not present

## 2014-11-21 DIAGNOSIS — I5042 Chronic combined systolic (congestive) and diastolic (congestive) heart failure: Secondary | ICD-10-CM | POA: Diagnosis not present

## 2014-11-21 DIAGNOSIS — E119 Type 2 diabetes mellitus without complications: Secondary | ICD-10-CM | POA: Diagnosis not present

## 2014-11-21 DIAGNOSIS — Z7409 Other reduced mobility: Secondary | ICD-10-CM | POA: Diagnosis not present

## 2014-11-21 DIAGNOSIS — N182 Chronic kidney disease, stage 2 (mild): Secondary | ICD-10-CM | POA: Diagnosis not present

## 2014-11-25 DIAGNOSIS — I5042 Chronic combined systolic (congestive) and diastolic (congestive) heart failure: Secondary | ICD-10-CM | POA: Diagnosis not present

## 2014-11-25 DIAGNOSIS — E119 Type 2 diabetes mellitus without complications: Secondary | ICD-10-CM | POA: Diagnosis not present

## 2014-11-25 DIAGNOSIS — Z7409 Other reduced mobility: Secondary | ICD-10-CM | POA: Diagnosis not present

## 2014-11-25 DIAGNOSIS — N182 Chronic kidney disease, stage 2 (mild): Secondary | ICD-10-CM | POA: Diagnosis not present

## 2014-11-26 DIAGNOSIS — I5042 Chronic combined systolic (congestive) and diastolic (congestive) heart failure: Secondary | ICD-10-CM | POA: Diagnosis not present

## 2014-11-26 DIAGNOSIS — N182 Chronic kidney disease, stage 2 (mild): Secondary | ICD-10-CM | POA: Diagnosis not present

## 2014-11-26 DIAGNOSIS — Z7409 Other reduced mobility: Secondary | ICD-10-CM | POA: Diagnosis not present

## 2014-11-26 DIAGNOSIS — E119 Type 2 diabetes mellitus without complications: Secondary | ICD-10-CM | POA: Diagnosis not present

## 2014-11-27 DIAGNOSIS — Z7409 Other reduced mobility: Secondary | ICD-10-CM | POA: Diagnosis not present

## 2014-11-27 DIAGNOSIS — I10 Essential (primary) hypertension: Secondary | ICD-10-CM | POA: Diagnosis not present

## 2014-11-27 DIAGNOSIS — I5032 Chronic diastolic (congestive) heart failure: Secondary | ICD-10-CM | POA: Diagnosis not present

## 2014-11-27 DIAGNOSIS — Z6841 Body Mass Index (BMI) 40.0 and over, adult: Secondary | ICD-10-CM | POA: Diagnosis not present

## 2014-11-27 DIAGNOSIS — N182 Chronic kidney disease, stage 2 (mild): Secondary | ICD-10-CM | POA: Diagnosis not present

## 2014-11-27 DIAGNOSIS — I5042 Chronic combined systolic (congestive) and diastolic (congestive) heart failure: Secondary | ICD-10-CM | POA: Diagnosis not present

## 2014-11-27 DIAGNOSIS — E119 Type 2 diabetes mellitus without complications: Secondary | ICD-10-CM | POA: Diagnosis not present

## 2014-11-28 DIAGNOSIS — N182 Chronic kidney disease, stage 2 (mild): Secondary | ICD-10-CM | POA: Diagnosis not present

## 2014-11-28 DIAGNOSIS — E119 Type 2 diabetes mellitus without complications: Secondary | ICD-10-CM | POA: Diagnosis not present

## 2014-11-28 DIAGNOSIS — Z7409 Other reduced mobility: Secondary | ICD-10-CM | POA: Diagnosis not present

## 2014-11-28 DIAGNOSIS — I5042 Chronic combined systolic (congestive) and diastolic (congestive) heart failure: Secondary | ICD-10-CM | POA: Diagnosis not present

## 2014-11-29 DIAGNOSIS — Z7409 Other reduced mobility: Secondary | ICD-10-CM | POA: Diagnosis not present

## 2014-11-29 DIAGNOSIS — N182 Chronic kidney disease, stage 2 (mild): Secondary | ICD-10-CM | POA: Diagnosis not present

## 2014-11-29 DIAGNOSIS — E119 Type 2 diabetes mellitus without complications: Secondary | ICD-10-CM | POA: Diagnosis not present

## 2014-11-29 DIAGNOSIS — I5042 Chronic combined systolic (congestive) and diastolic (congestive) heart failure: Secondary | ICD-10-CM | POA: Diagnosis not present

## 2014-12-02 DIAGNOSIS — Z7409 Other reduced mobility: Secondary | ICD-10-CM | POA: Diagnosis not present

## 2014-12-02 DIAGNOSIS — I5042 Chronic combined systolic (congestive) and diastolic (congestive) heart failure: Secondary | ICD-10-CM | POA: Diagnosis not present

## 2014-12-02 DIAGNOSIS — E119 Type 2 diabetes mellitus without complications: Secondary | ICD-10-CM | POA: Diagnosis not present

## 2014-12-02 DIAGNOSIS — N182 Chronic kidney disease, stage 2 (mild): Secondary | ICD-10-CM | POA: Diagnosis not present

## 2014-12-03 DIAGNOSIS — I5042 Chronic combined systolic (congestive) and diastolic (congestive) heart failure: Secondary | ICD-10-CM | POA: Diagnosis not present

## 2014-12-03 DIAGNOSIS — Z7409 Other reduced mobility: Secondary | ICD-10-CM | POA: Diagnosis not present

## 2014-12-03 DIAGNOSIS — E119 Type 2 diabetes mellitus without complications: Secondary | ICD-10-CM | POA: Diagnosis not present

## 2014-12-03 DIAGNOSIS — N182 Chronic kidney disease, stage 2 (mild): Secondary | ICD-10-CM | POA: Diagnosis not present

## 2014-12-04 DIAGNOSIS — Z7409 Other reduced mobility: Secondary | ICD-10-CM | POA: Diagnosis not present

## 2014-12-04 DIAGNOSIS — I5042 Chronic combined systolic (congestive) and diastolic (congestive) heart failure: Secondary | ICD-10-CM | POA: Diagnosis not present

## 2014-12-04 DIAGNOSIS — E119 Type 2 diabetes mellitus without complications: Secondary | ICD-10-CM | POA: Diagnosis not present

## 2014-12-04 DIAGNOSIS — N182 Chronic kidney disease, stage 2 (mild): Secondary | ICD-10-CM | POA: Diagnosis not present

## 2014-12-05 DIAGNOSIS — N182 Chronic kidney disease, stage 2 (mild): Secondary | ICD-10-CM | POA: Diagnosis not present

## 2014-12-05 DIAGNOSIS — E119 Type 2 diabetes mellitus without complications: Secondary | ICD-10-CM | POA: Diagnosis not present

## 2014-12-05 DIAGNOSIS — Z7409 Other reduced mobility: Secondary | ICD-10-CM | POA: Diagnosis not present

## 2014-12-05 DIAGNOSIS — I5042 Chronic combined systolic (congestive) and diastolic (congestive) heart failure: Secondary | ICD-10-CM | POA: Diagnosis not present

## 2014-12-06 DIAGNOSIS — I5042 Chronic combined systolic (congestive) and diastolic (congestive) heart failure: Secondary | ICD-10-CM | POA: Diagnosis not present

## 2014-12-06 DIAGNOSIS — N182 Chronic kidney disease, stage 2 (mild): Secondary | ICD-10-CM | POA: Diagnosis not present

## 2014-12-06 DIAGNOSIS — E119 Type 2 diabetes mellitus without complications: Secondary | ICD-10-CM | POA: Diagnosis not present

## 2014-12-06 DIAGNOSIS — Z7409 Other reduced mobility: Secondary | ICD-10-CM | POA: Diagnosis not present

## 2014-12-09 DIAGNOSIS — E119 Type 2 diabetes mellitus without complications: Secondary | ICD-10-CM | POA: Diagnosis not present

## 2014-12-09 DIAGNOSIS — N182 Chronic kidney disease, stage 2 (mild): Secondary | ICD-10-CM | POA: Diagnosis not present

## 2014-12-09 DIAGNOSIS — I5042 Chronic combined systolic (congestive) and diastolic (congestive) heart failure: Secondary | ICD-10-CM | POA: Diagnosis not present

## 2014-12-09 DIAGNOSIS — Z7409 Other reduced mobility: Secondary | ICD-10-CM | POA: Diagnosis not present

## 2014-12-10 DIAGNOSIS — Z7409 Other reduced mobility: Secondary | ICD-10-CM | POA: Diagnosis not present

## 2014-12-10 DIAGNOSIS — E119 Type 2 diabetes mellitus without complications: Secondary | ICD-10-CM | POA: Diagnosis not present

## 2014-12-10 DIAGNOSIS — N182 Chronic kidney disease, stage 2 (mild): Secondary | ICD-10-CM | POA: Diagnosis not present

## 2014-12-10 DIAGNOSIS — I5042 Chronic combined systolic (congestive) and diastolic (congestive) heart failure: Secondary | ICD-10-CM | POA: Diagnosis not present

## 2014-12-11 ENCOUNTER — Other Ambulatory Visit: Payer: Self-pay | Admitting: *Deleted

## 2014-12-11 ENCOUNTER — Other Ambulatory Visit: Payer: Self-pay | Admitting: Family Medicine

## 2014-12-11 DIAGNOSIS — I5042 Chronic combined systolic (congestive) and diastolic (congestive) heart failure: Secondary | ICD-10-CM | POA: Diagnosis not present

## 2014-12-11 DIAGNOSIS — Z7409 Other reduced mobility: Secondary | ICD-10-CM | POA: Diagnosis not present

## 2014-12-11 DIAGNOSIS — E119 Type 2 diabetes mellitus without complications: Secondary | ICD-10-CM | POA: Diagnosis not present

## 2014-12-11 DIAGNOSIS — N182 Chronic kidney disease, stage 2 (mild): Secondary | ICD-10-CM | POA: Diagnosis not present

## 2014-12-12 DIAGNOSIS — Z7409 Other reduced mobility: Secondary | ICD-10-CM | POA: Diagnosis not present

## 2014-12-12 DIAGNOSIS — E119 Type 2 diabetes mellitus without complications: Secondary | ICD-10-CM | POA: Diagnosis not present

## 2014-12-12 DIAGNOSIS — N182 Chronic kidney disease, stage 2 (mild): Secondary | ICD-10-CM | POA: Diagnosis not present

## 2014-12-12 DIAGNOSIS — I5042 Chronic combined systolic (congestive) and diastolic (congestive) heart failure: Secondary | ICD-10-CM | POA: Diagnosis not present

## 2014-12-12 MED ORDER — ATORVASTATIN CALCIUM 20 MG PO TABS: 20.0000 mg | ORAL_TABLET | Freq: Every day | ORAL | Status: DC

## 2014-12-13 DIAGNOSIS — N182 Chronic kidney disease, stage 2 (mild): Secondary | ICD-10-CM | POA: Diagnosis not present

## 2014-12-13 DIAGNOSIS — E119 Type 2 diabetes mellitus without complications: Secondary | ICD-10-CM | POA: Diagnosis not present

## 2014-12-13 DIAGNOSIS — Z7409 Other reduced mobility: Secondary | ICD-10-CM | POA: Diagnosis not present

## 2014-12-13 DIAGNOSIS — J45909 Unspecified asthma, uncomplicated: Secondary | ICD-10-CM | POA: Diagnosis not present

## 2014-12-13 DIAGNOSIS — I5042 Chronic combined systolic (congestive) and diastolic (congestive) heart failure: Secondary | ICD-10-CM | POA: Diagnosis not present

## 2014-12-14 DIAGNOSIS — E119 Type 2 diabetes mellitus without complications: Secondary | ICD-10-CM | POA: Diagnosis not present

## 2014-12-14 DIAGNOSIS — Z7409 Other reduced mobility: Secondary | ICD-10-CM | POA: Diagnosis not present

## 2014-12-14 DIAGNOSIS — N182 Chronic kidney disease, stage 2 (mild): Secondary | ICD-10-CM | POA: Diagnosis not present

## 2014-12-14 DIAGNOSIS — I5042 Chronic combined systolic (congestive) and diastolic (congestive) heart failure: Secondary | ICD-10-CM | POA: Diagnosis not present

## 2014-12-16 DIAGNOSIS — Z7409 Other reduced mobility: Secondary | ICD-10-CM | POA: Diagnosis not present

## 2014-12-16 DIAGNOSIS — E119 Type 2 diabetes mellitus without complications: Secondary | ICD-10-CM | POA: Diagnosis not present

## 2014-12-16 DIAGNOSIS — I5042 Chronic combined systolic (congestive) and diastolic (congestive) heart failure: Secondary | ICD-10-CM | POA: Diagnosis not present

## 2014-12-16 DIAGNOSIS — N182 Chronic kidney disease, stage 2 (mild): Secondary | ICD-10-CM | POA: Diagnosis not present

## 2014-12-17 DIAGNOSIS — E119 Type 2 diabetes mellitus without complications: Secondary | ICD-10-CM | POA: Diagnosis not present

## 2014-12-17 DIAGNOSIS — Z7409 Other reduced mobility: Secondary | ICD-10-CM | POA: Diagnosis not present

## 2014-12-17 DIAGNOSIS — N182 Chronic kidney disease, stage 2 (mild): Secondary | ICD-10-CM | POA: Diagnosis not present

## 2014-12-17 DIAGNOSIS — I5042 Chronic combined systolic (congestive) and diastolic (congestive) heart failure: Secondary | ICD-10-CM | POA: Diagnosis not present

## 2014-12-18 DIAGNOSIS — I5042 Chronic combined systolic (congestive) and diastolic (congestive) heart failure: Secondary | ICD-10-CM | POA: Diagnosis not present

## 2014-12-18 DIAGNOSIS — Z7409 Other reduced mobility: Secondary | ICD-10-CM | POA: Diagnosis not present

## 2014-12-18 DIAGNOSIS — N182 Chronic kidney disease, stage 2 (mild): Secondary | ICD-10-CM | POA: Diagnosis not present

## 2014-12-18 DIAGNOSIS — E119 Type 2 diabetes mellitus without complications: Secondary | ICD-10-CM | POA: Diagnosis not present

## 2014-12-19 DIAGNOSIS — I5042 Chronic combined systolic (congestive) and diastolic (congestive) heart failure: Secondary | ICD-10-CM | POA: Diagnosis not present

## 2014-12-19 DIAGNOSIS — N182 Chronic kidney disease, stage 2 (mild): Secondary | ICD-10-CM | POA: Diagnosis not present

## 2014-12-19 DIAGNOSIS — Z7409 Other reduced mobility: Secondary | ICD-10-CM | POA: Diagnosis not present

## 2014-12-19 DIAGNOSIS — E119 Type 2 diabetes mellitus without complications: Secondary | ICD-10-CM | POA: Diagnosis not present

## 2014-12-30 DIAGNOSIS — L03032 Cellulitis of left toe: Secondary | ICD-10-CM | POA: Diagnosis not present

## 2014-12-30 DIAGNOSIS — S91309A Unspecified open wound, unspecified foot, initial encounter: Secondary | ICD-10-CM | POA: Diagnosis not present

## 2014-12-30 DIAGNOSIS — M216X2 Other acquired deformities of left foot: Secondary | ICD-10-CM | POA: Diagnosis not present

## 2015-01-07 ENCOUNTER — Ambulatory Visit: Payer: Medicare Other | Admitting: Neurology

## 2015-01-08 ENCOUNTER — Ambulatory Visit: Payer: Medicare Other | Admitting: Family Medicine

## 2015-01-08 DIAGNOSIS — L97529 Non-pressure chronic ulcer of other part of left foot with unspecified severity: Secondary | ICD-10-CM | POA: Diagnosis not present

## 2015-01-10 ENCOUNTER — Encounter: Payer: Self-pay | Admitting: Family Medicine

## 2015-01-10 ENCOUNTER — Ambulatory Visit (INDEPENDENT_AMBULATORY_CARE_PROVIDER_SITE_OTHER): Payer: Medicare Other | Admitting: Family Medicine

## 2015-01-10 VITALS — BP 126/55 | HR 90 | Temp 98.4°F | Ht 64.0 in | Wt 277.4 lb

## 2015-01-10 DIAGNOSIS — N183 Chronic kidney disease, stage 3 unspecified: Secondary | ICD-10-CM

## 2015-01-10 DIAGNOSIS — I5032 Chronic diastolic (congestive) heart failure: Secondary | ICD-10-CM

## 2015-01-10 DIAGNOSIS — E2839 Other primary ovarian failure: Secondary | ICD-10-CM | POA: Insufficient documentation

## 2015-01-10 DIAGNOSIS — E1149 Type 2 diabetes mellitus with other diabetic neurological complication: Secondary | ICD-10-CM

## 2015-01-10 DIAGNOSIS — E119 Type 2 diabetes mellitus without complications: Secondary | ICD-10-CM | POA: Diagnosis not present

## 2015-01-10 DIAGNOSIS — I10 Essential (primary) hypertension: Secondary | ICD-10-CM

## 2015-01-10 DIAGNOSIS — N182 Chronic kidney disease, stage 2 (mild): Secondary | ICD-10-CM | POA: Diagnosis not present

## 2015-01-10 DIAGNOSIS — Z794 Long term (current) use of insulin: Secondary | ICD-10-CM

## 2015-01-10 DIAGNOSIS — E118 Type 2 diabetes mellitus with unspecified complications: Secondary | ICD-10-CM

## 2015-01-10 DIAGNOSIS — E1140 Type 2 diabetes mellitus with diabetic neuropathy, unspecified: Secondary | ICD-10-CM | POA: Diagnosis not present

## 2015-01-10 LAB — BASIC METABOLIC PANEL
BUN: 53 mg/dL — ABNORMAL HIGH (ref 6–23)
CO2: 22 mEq/L (ref 19–32)
Calcium: 9.6 mg/dL (ref 8.4–10.5)
Chloride: 101 mEq/L (ref 96–112)
Creat: 2.48 mg/dL — ABNORMAL HIGH (ref 0.50–1.10)
Glucose, Bld: 161 mg/dL — ABNORMAL HIGH (ref 70–99)
Potassium: 6.1 mEq/L — ABNORMAL HIGH (ref 3.5–5.3)
Sodium: 134 mEq/L — ABNORMAL LOW (ref 135–145)

## 2015-01-10 LAB — POCT GLYCOSYLATED HEMOGLOBIN (HGB A1C): Hemoglobin A1C: 6.8

## 2015-01-10 MED ORDER — INSULIN NPH (HUMAN) (ISOPHANE) 100 UNIT/ML ~~LOC~~ SUSP: 30.0000 [IU] | Freq: Every morning | SUBCUTANEOUS | Status: DC

## 2015-01-10 NOTE — Patient Instructions (Addendum)
Your diabetes is under good control.  I refilled your insulin.  Your A1C was 6.8.  Your weight was 277 I did order a bone density. I will call in 2 weeks with test results. Keep up the good work with weight loss and diabetes.

## 2015-01-10 NOTE — Assessment & Plan Note (Signed)
Good control.  No change.  Get eye exam.

## 2015-01-10 NOTE — Progress Notes (Signed)
   Subjective:    Patient ID: Brittany Roberts, female    DOB: 1940/08/30, 74 y.o.   MRN: [id number]  HPI Brittany Roberts comes in for several issues. 1. DM recheck A1C - it is great.  Needs refill on humalin 2. Had foot surg for ?abscess by podiatry.  Healing well. 3. HPDP, needs foot exam and ophth exam.  She has appointment.  Will request that I get records. 4. Brought in labs from neuro with several mild abnormalities.  Most important are: low TSH, normal T4, will observe and repeat in 6 months.  Creat slight bump.  Back on ACE, will recheck today. BP good.    Review of Systems     Objective:   Physical Exam Lungs clear Cardiac RRR without m or g        Assessment & Plan:

## 2015-01-10 NOTE — Assessment & Plan Note (Signed)
Stable on current meds.  No change. 

## 2015-01-10 NOTE — Assessment & Plan Note (Signed)
Good control, recheck creat.

## 2015-01-10 NOTE — Assessment & Plan Note (Addendum)
Recheck creat.  Bump in creat and hyperkalemia.  Called and to to stop lisinopril.  Recheck BMP this week.

## 2015-01-12 DIAGNOSIS — J45909 Unspecified asthma, uncomplicated: Secondary | ICD-10-CM | POA: Diagnosis not present

## 2015-01-13 ENCOUNTER — Other Ambulatory Visit: Payer: Self-pay | Admitting: Family Medicine

## 2015-01-13 DIAGNOSIS — Z1231 Encounter for screening mammogram for malignant neoplasm of breast: Secondary | ICD-10-CM

## 2015-01-14 ENCOUNTER — Encounter: Payer: Self-pay | Admitting: Neurology

## 2015-01-14 ENCOUNTER — Ambulatory Visit (INDEPENDENT_AMBULATORY_CARE_PROVIDER_SITE_OTHER): Payer: Medicare Other | Admitting: Neurology

## 2015-01-14 ENCOUNTER — Ambulatory Visit: Payer: Self-pay | Admitting: Neurology

## 2015-01-14 VITALS — BP 115/61 | HR 92 | Ht 64.0 in | Wt 277.0 lb

## 2015-01-14 DIAGNOSIS — G629 Polyneuropathy, unspecified: Secondary | ICD-10-CM | POA: Diagnosis not present

## 2015-01-14 DIAGNOSIS — G2401 Drug induced subacute dyskinesia: Secondary | ICD-10-CM | POA: Diagnosis not present

## 2015-01-14 MED ORDER — DULOXETINE HCL 60 MG PO CPEP: 60.0000 mg | ORAL_CAPSULE | Freq: Every day | ORAL | Status: DC

## 2015-01-14 MED ORDER — GABAPENTIN 300 MG PO CAPS: 300.0000 mg | ORAL_CAPSULE | Freq: Two times a day (BID) | ORAL | Status: DC

## 2015-01-14 NOTE — Progress Notes (Signed)
Chief Complaint  Patient presents with  . Peripheral Neuropathy    She has noticed an improvement in the burning and pain in her feet since starting Cymbalta.  . Gait Problem    She walks slow but can get around on her own with a walker.  She has not had any falls.      PATIENT: Brittany Roberts DOB: Apr 15, 1941  HISTORICAL  SYRA SICKINGER is a 74 yo RH female is referred by her PCP DR. Hensel and Podiatrist Dr. Barkley Bruns for evaluation of bilateral feet pain.  She has insulin-dependent diabetes for more than 15 years, previous history of lupus, congestive heart failure, obesity, is tearful during today's interview  She noticed gradual onset bilateral feet paresthesia since 2006, gradually getting worse, now has constant burning shooting pain, right worse than left, she was seen by podiatrist in the past, get local injection, mild improvement only,  She also has mild bilateral fingertips paresthesia, low back pain, radiating pain to her right hip, she ambulate with a walker, lives alone at her apartment, need transportation Laboratory in 2016 reviewed, A1c was 6.8, BMP showed elevated creatinine 1.5  She has tried Lyrica in the past, causing swelling of her tongue,  UPDATE January 14 2015: She has left toe lateral side gangrene, is under the care of podiatrist, with intervention, her left toe infection has much improved. She continues to complains of bilateral feet paresthesia, responding to Cymbalta, and gabapentin  REVIEW OF SYSTEMS: Full 14 system review of systems performed and notable only for fatigue, hearing loss, ringing ears, runny nose, trouble swallowing, eye discharge, eye itching, double vision, blurred vision, cough, shortness of breath, choking, chest tightness, leg swelling, excessive eating, constipation, abdominal pain, blood in urine, joint swelling, back pain, achy muscles, muscle cramps, walking difficulties, neck pain, neck stiffness, itching, memory loss, headaches, speech  difficulty, tremor, confusion, anxiety  ALLERGIES: Allergies  Allergen Reactions  . Ace Inhibitors     Coincided with sig bump in creat  . Buspar [Buspirone]     pain  . Pregabalin     REACTION: swelling  . Ropinirole Hydrochloride     REACTION: swelling  . Amantadines Rash  . Penicillins Swelling and Rash    HOME MEDICATIONS: Current Outpatient Prescriptions  Medication Sig Dispense Refill  . amLODipine (NORVASC) 10 MG tablet Take 1 tablet (10 mg total) by mouth daily. 90 tablet 3  . aspirin EC 81 MG tablet Take 81 mg by mouth daily.    Marland Kitchen atorvastatin (LIPITOR) 20 MG tablet Take 1 tablet (20 mg total) by mouth daily. 90 tablet 3  . b complex vitamins capsule Take 1 capsule by mouth daily.    . B-D INS SYRINGE 0.5CC/30GX1/2" 30G X 1/2" 0.5 ML MISC use as directed 100 each PRN  . busPIRone (BUSPAR) 10 MG tablet Take 1 tablet (10 mg total) by mouth 3 (three) times daily. 90 tablet 1  . cholecalciferol (VITAMIN D) 1000 UNITS tablet Take 1,000 Units by mouth daily.    . clindamycin (CLEOCIN) 150 MG capsule Take 1 capsule (150 mg total) by mouth 3 (three) times daily. For sinus infection 30 capsule 0  . escitalopram (LEXAPRO) 20 MG tablet take 1 tablet by mouth once daily 90 tablet 3  . FLOVENT HFA 220 MCG/ACT inhaler inhale 1 puff by mouth twice a day 36 g 3  . furosemide (LASIX) 20 MG tablet Take 1 tablet (20 mg total) by mouth every other day. 30 tablet 3  .  gabapentin (NEURONTIN) 300 MG capsule Take 1 capsule (300 mg total) by mouth 2 (two) times daily. 60 capsule 11  . glucose blood test strip Check blood sugar three times per day. 300 each 10  . insulin NPH Human (HUMULIN N,NOVOLIN N) 100 UNIT/ML injection Inject 30 Units into the skin every morning.    . isosorbide mononitrate (IMDUR) 60 MG 24 hr tablet take 1 tablet by mouth once daily 90 tablet 3  . lisinopril (PRINIVIL,ZESTRIL) 5 MG tablet Take 1 tablet (5 mg total) by mouth daily. 90 tablet 3  . mometasone (ELOCON) 0.1 %  ointment Apply topically daily. 45 g 3  . nitroGLYCERIN (NITROSTAT) 0.4 MG SL tablet Place 1 tablet (0.4 mg total) under the tongue every 5 (five) minutes as needed for chest pain. 20 tablet 12  . pantoprazole (PROTONIX) 40 MG tablet Take 1 tablet (40 mg total) by mouth 2 (two) times daily. 180 tablet 12  . SYNTHROID 50 MCG tablet take 1 tablet by mouth once daily 90 tablet 3  . traMADol (ULTRAM) 50 MG tablet TAKE 1 TABLET BY MOUTH 3 TIMES DAILY 90 tablet 5  . traZODone (DESYREL) 50 MG tablet take 1 tablet by mouth at bedtime if needed for sleep 90 tablet 3  . VITAMIN A PO Take 1 tablet by mouth daily.      PAST MEDICAL HISTORY: Past Medical History  Diagnosis Date  . Depression   . Lupus   . Asthma   . Diabetes mellitus   . Hypertension   . Hypothyroidism   . Congestive heart failure, unspecified   . Other and unspecified hyperlipidemia   . Unspecified vitamin D deficiency   . Renal failure, unspecified   . History of oxygen administration     oxygen use 2l/m nasally during the night  . Cataracts, both eyes   . OSA (obstructive sleep apnea)     no cpap used  . Peripheral neuropathy     PAST SURGICAL HISTORY: Past Surgical History  Procedure Laterality Date  . Tubal ligation    . Nasal sinus surgery    . Gallbladder surgery    . Carpal tunnel release Bilateral   . Ankle arthroscopy with open reduction internal fixation (orif) Right     2-3 yrs ago-retained screws  . Cholecystectomy    . Colonoscopy N/A 12/10/2013    Procedure: COLONOSCOPY;  Surgeon: Inda Castle, MD;  Location: [facility];  Service: Endoscopy;  Laterality: N/A;  . Left heart catheterization with coronary angiogram N/A 08/08/2012    Procedure: LEFT HEART CATHETERIZATION WITH CORONARY ANGIOGRAM;  Surgeon: [name], MD;  Location: [facility] CATH LAB;  Service: Cardiovascular;  Laterality: N/A;  . Percutaneous coronary stent intervention (pci-s) N/A 08/21/2012    Procedure: PERCUTANEOUS CORONARY STENT  INTERVENTION (PCI-S);  Surgeon: [name], MD;  Location: [facility] CATH LAB;  Service: Cardiovascular;  Laterality: N/A;    FAMILY HISTORY: Family History  Problem Relation Age of Onset  . Heart disease Mother   . Diabetes Mother   . Clotting disorder Mother   . Pneumonia Father   . Rheum arthritis Father   . Diabetes Sister   . Diabetes Sister   . Asthma Brother   . Colon cancer Neg Hx     SOCIAL HISTORY:  History   Social History  . Marital Status: Single    Spouse Name: N/A  . Number of Children: 4  . Years of Education: 12 th   Occupational History  .  RetiredMedia planner    Social History Main Topics  . Smoking status: Former Smoker -- 3.00 packs/day for 20 years    Types: Cigarettes    Quit date: 07/06/1999  . Smokeless tobacco: Never Used  . Alcohol Use: No  . Drug Use: No  . Sexual Activity: Not Currently   Other Topics Concern  . Not on file   Social History Narrative   Health Care POA:    Emergency Contact: daughter, Lorenso Courier [phone] or [name] [phone]   End of Life Plan: Does not want to be ventilated or feeding tubes.    Who lives with you: Lives alone   Any pets: none   Diet: Patient reports enjoying and eating junk food.  Does not regulate types of food and currently her dentures are broken so it is hard to eat some foods.   Exercise: Patient does not have an exercise plan.   Seatbelts: Patient reports wearing seatbelt when in vehicle.   Sun Exposure/Protection: Patient reports not going outside very often.   Hobbies: Patient enjoys reading the bible and watching game shows.    Right-handed.   1 cup caffeine per day.        PHYSICAL EXAM   There were no vitals filed for this visit.  Not recorded      There is no weight on file to calculate BMI.  PHYSICAL EXAMNIATION:  Gen: NAD, conversant, well nourised, obese, well groomed                     Cardiovascular: Regular rate rhythm, no peripheral edema, warm,  nontender. Eyes: Conjunctivae clear without exudates or hemorrhage Neck: Supple, no carotid bruise. Pulmonary: Clear to auscultation bilaterally   NEUROLOGICAL EXAM:  MENTAL STATUS: Speech:    Speech is normal; fluent and spontaneous with normal comprehension.  Cognition: Obese, tearful    The patient is oriented to person, place, and time;     recent and remote memory intact;     language fluent;     normal attention, concentration,     fund of knowledge.  CRANIAL NERVES: CN II: Visual fields are full to confrontation. Pupil equal round reactive to light CN III, IV, VI: extraocular movement are normal. No ptosis. CN V: Facial sensation is intact to pinprick in all 3 divisions bilaterally. Corneal responses are intact.  CN VII: Face is symmetric with normal eye closure and smile. CN VIII: Hearing is normal to rubbing fingers CN IX, X: Palate elevates symmetrically. Phonation is normal. CN XI: Head turning and shoulder shrug are intact CN XII: Tongue is midline with normal movements and no atrophy.  MOTOR: There is no pronator drift of out-stretched arms. Muscle bulk and tone are normal. Muscle strength is normal. She has left fifth toe lateral side gangrene   Shoulder abduction Shoulder external rotation Elbow flexion Elbow extension Wrist flexion Wrist extension Finger abduction Hip flexion Knee flexion Knee extension Ankle dorsi flexion Ankle plantar flexion  R 5 5 5 5 5 5 5 5 5 5 5 5   L 5 5 5 5 5 5 5 5 5 5 5 5     REFLEXES: Reflexes are 1 and symmetric at the biceps, triceps, absent at bilateral knees, and ankles. Plantar responses are flexor.  SENSORY: Length dependent decreased, pinprick, position sense to above ankle level, and absent toe vibratory sensation, and preserved proprioception at toes.  COORDINATION: Rapid alternating movements and fine finger movements are intact. There is no dysmetria on finger-to-nose  and heel-knee-shin. There are no abnormal or extraneous  movements.   GAIT/STANCE: She needs to push on chair arm to get up from seated position, wide-based, unsteady, cautious gait, limited because of her big body habitus   DIAGNOSTIC DATA (LABS, IMAGING, TESTING) - I reviewed patient records, labs, notes, testing and imaging myself where available.  Lab Results  Component Value Date   WBC 9.0 10/29/2014   HGB 10.9* 08/04/2014   HCT 33.0* 10/29/2014   MCV 71.4* 08/04/2014   PLT 209 08/04/2014      Component Value Date/Time   NA 134* 01/10/2015 1018   NA 140 10/29/2014 1100   K 6.1* 01/10/2015 1018   CL 101 01/10/2015 1018   CO2 22 01/10/2015 1018   GLUCOSE 161* 01/10/2015 1018   GLUCOSE 146* 10/29/2014 1100   BUN 53* 01/10/2015 1018   BUN 34* 10/29/2014 1100   CREATININE 2.48* 01/10/2015 1018   CREATININE 1.86* 10/29/2014 1100   CALCIUM 9.6 01/10/2015 1018   PROT 7.4 10/29/2014 1100   PROT 8.5* 02/16/2013 1104   ALBUMIN 3.8 02/16/2013 1104   AST 26 10/29/2014 1100   ALT 19 10/29/2014 1100   ALKPHOS 132* 10/29/2014 1100   BILITOT 0.3 10/29/2014 1100   BILITOT 0.3 03/12/2014 1331   GFRNONAA 26* 10/29/2014 1100   GFRNONAA 34* 02/16/2013 1104   GFRAA 30* 10/29/2014 1100   GFRAA 40* 02/16/2013 1104   Lab Results  Component Value Date   CHOL 103 07/19/2014   HDL 38* 07/19/2014   LDLCALC 49 07/19/2014   LDLDIRECT 59 02/16/2013   TRIG 81 07/19/2014   CHOLHDL 2.7 07/19/2014   Lab Results  Component Value Date   HGBA1C 6.8 01/10/2015   Lab Results  Component Value Date   VITAMINB12 926 10/29/2014   Lab Results  Component Value Date   TSH 0.207* 10/29/2014     ASSESSMENT AND PLAN  MADDI KOBES is a 74 y.o. female with past medical history of obesity, insulin-dependent diabetes, peripheral neuropathy, presenting with whole body achy pain, bilateral feet burning pain, also had a history of lupus  Diabetic peripheral neuropathy, tight control of her diabetes. Cymbalta 60 mg daily, along with gabapentin 300 mg 3  times a day, refilled her medications.   Marcial Pacas, M.D. Ph.D.  [facility] [address], [place], [place] [address] Ph: [phone] Fax: [phone]

## 2015-01-15 ENCOUNTER — Telehealth: Payer: Self-pay | Admitting: Cardiovascular Disease

## 2015-01-15 NOTE — Telephone Encounter (Signed)
Received records from [facility] for appointment on 01/21/15 with Dr Gwenlyn Found.  Records given to D Ricci Barker (medical records) for Dr Kennon Holter schedule on 01/21/15. lp

## 2015-01-16 DIAGNOSIS — L97529 Non-pressure chronic ulcer of other part of left foot with unspecified severity: Secondary | ICD-10-CM | POA: Diagnosis not present

## 2015-01-20 ENCOUNTER — Other Ambulatory Visit: Payer: Self-pay | Admitting: Family Medicine

## 2015-01-20 ENCOUNTER — Ambulatory Visit
Admission: RE | Admit: 2015-01-20 | Discharge: 2015-01-20 | Disposition: A | Discharge status: Home / Self Care | Payer: Medicare Other | Source: Ambulatory Visit | Attending: Family Medicine | Admitting: Family Medicine

## 2015-01-20 DIAGNOSIS — Z1231 Encounter for screening mammogram for malignant neoplasm of breast: Secondary | ICD-10-CM | POA: Diagnosis not present

## 2015-01-20 DIAGNOSIS — E119 Type 2 diabetes mellitus without complications: Secondary | ICD-10-CM

## 2015-01-20 DIAGNOSIS — E2839 Other primary ovarian failure: Secondary | ICD-10-CM

## 2015-01-20 DIAGNOSIS — Z794 Long term (current) use of insulin: Principal | ICD-10-CM

## 2015-01-20 DIAGNOSIS — Z13820 Encounter for screening for osteoporosis: Secondary | ICD-10-CM | POA: Diagnosis not present

## 2015-01-20 DIAGNOSIS — IMO0001 Reserved for inherently not codable concepts without codable children: Secondary | ICD-10-CM

## 2015-01-20 DIAGNOSIS — E1129 Type 2 diabetes mellitus with other diabetic kidney complication: Secondary | ICD-10-CM

## 2015-01-20 DIAGNOSIS — Z780 Asymptomatic menopausal state: Secondary | ICD-10-CM | POA: Diagnosis not present

## 2015-01-20 MED ORDER — GLUCOSE BLOOD VI STRP: ORAL_STRIP | Status: DC

## 2015-01-20 NOTE — Addendum Note (Signed)
Addended by: Zenia Resides on: 01/20/2015 09:06 AM   Modules accepted: Orders

## 2015-01-20 NOTE — Assessment & Plan Note (Signed)
Reorder test strips.

## 2015-01-21 ENCOUNTER — Encounter: Payer: Self-pay | Admitting: Cardiovascular Disease

## 2015-01-21 ENCOUNTER — Ambulatory Visit (INDEPENDENT_AMBULATORY_CARE_PROVIDER_SITE_OTHER): Payer: Medicare Other | Admitting: Cardiovascular Disease

## 2015-01-21 VITALS — BP 138/82 | HR 76 | Ht 63.0 in | Wt 278.9 lb

## 2015-01-21 DIAGNOSIS — S91332A Puncture wound without foreign body, left foot, initial encounter: Secondary | ICD-10-CM | POA: Diagnosis not present

## 2015-01-21 DIAGNOSIS — S91339A Puncture wound without foreign body, unspecified foot, initial encounter: Secondary | ICD-10-CM | POA: Insufficient documentation

## 2015-01-21 DIAGNOSIS — S81802A Unspecified open wound, left lower leg, initial encounter: Secondary | ICD-10-CM | POA: Diagnosis not present

## 2015-01-21 NOTE — Patient Instructions (Signed)
Your physician has requested that you have a lower extremity arterial duplex. This test is an ultrasound of the arteries in the legs. It looks at arterial blood flow in the legs. Allow one hour for Lower Arterial scans. There are no restrictions or special instructions   Your physician recommends that you schedule a follow-up appointment as needed. We will contact you with your test results.

## 2015-01-21 NOTE — Assessment & Plan Note (Signed)
Brittany Roberts was referred by Dr. Barkley Bruns, her podiatrist, for peripheral vascular evaluation because of a slowly healing left foot wound. This is on a pressure point on the plantar surface under her fifth metatarsal. She thinks that she injured this or had some trauma in this assessment. Instrumented by Dr. Barkley Bruns and is slow to heal. She does have a palpable pedal pulse. I'm going to obtain lower extremity arterial Doppler studies.

## 2015-01-21 NOTE — Progress Notes (Signed)
01/21/2015 Brittany Roberts   1941/06/08  [id number]  Primary Physician Brittany Gottron, MD Primary Cardiologist: Brittany Harp MD Brittany Roberts   HPI:  Brittany Roberts is a 74 year old severely overweight single African-American female mother of 2 living children (2 deceased) referred by Dr. Barkley Roberts for peripheral vascular evaluation. Her cardiologist is Dr. Einar Roberts  and primary care physician Dr. Andria Roberts. She has a history of treated hypertension, hyperlipidemia and diabetes. She has had a stent placed in her heart by Dr. Einar Roberts in the past. She smoked  3 packs a day for 30-40 years and stopped approximately 10-15 years ago. She apparently traumatized her left foot and was seen by Dr. Barkley Roberts who instrumented the wound on the plantar surface of her left foot under the fifth metatarsal which has been slow to heal. She does have a intact pedal pulse on that side.   Current Outpatient Prescriptions  Medication Sig Dispense Refill  . amLODipine (NORVASC) 10 MG tablet Take 1 tablet (10 mg total) by mouth daily. 90 tablet 3  . aspirin EC 81 MG tablet Take 81 mg by mouth daily.    Marland Kitchen atorvastatin (LIPITOR) 20 MG tablet Take 1 tablet (20 mg total) by mouth daily. 90 tablet 3  . B-D INS SYRINGE 0.5CC/30GX1/2" 30G X 1/2" 0.5 ML MISC use as directed 100 each PRN  . DULoxetine (CYMBALTA) 60 MG capsule Take 1 capsule (60 mg total) by mouth daily. 90 capsule 3  . ferrous sulfate 325 (65 FE) MG tablet Take 1 tablet (325 mg total) by mouth daily with breakfast. 100 tablet 3  . FLOVENT HFA 220 MCG/ACT inhaler inhale 1 puff by mouth twice a day 36 g 3  . furosemide (LASIX) 20 MG tablet Take 1 tablet (20 mg total) by mouth every other day. 30 tablet 3  . gabapentin (NEURONTIN) 300 MG capsule Take 1 capsule (300 mg total) by mouth 2 (two) times daily. 180 capsule 3  . glucose blood test strip Check blood sugar three times per day. 300 each 10  . insulin NPH Human (HUMULIN N) 100 UNIT/ML injection Inject  0.3 mLs (30 Units total) into the skin every morning. 30 mL 3  . isosorbide mononitrate (IMDUR) 60 MG 24 hr tablet take 1 tablet by mouth once daily 90 tablet 3  . lisinopril (PRINIVIL,ZESTRIL) 5 MG tablet Take 1 tablet (5 mg total) by mouth daily. 90 tablet 3  . mometasone (ELOCON) 0.1 % ointment Apply topically daily. 45 g 3  . nitroGLYCERIN (NITROSTAT) 0.4 MG SL tablet Place 1 tablet (0.4 mg total) under the tongue every 5 (five) minutes as needed for chest pain. 20 tablet 12  . pantoprazole (PROTONIX) 40 MG tablet take 1 tablet by mouth twice a day 180 tablet 3  . spironolactone (ALDACTONE) 25 MG tablet take 1 tablet by mouth twice a day 180 tablet 3  . SYNTHROID 50 MCG tablet take 1 tablet by mouth once daily 90 tablet 3  . traMADol (ULTRAM) 50 MG tablet TAKE 1 TABLET BY MOUTH 3 TIMES DAILY 90 tablet 5  . traZODone (DESYREL) 50 MG tablet take 1 tablet by mouth at bedtime if needed for sleep 90 tablet 3   No current facility-administered medications for this visit.    Allergies  Allergen Reactions  . Ace Inhibitors     Coincided with sig bump in creat  . Buspar [Buspirone]     pain  . Pregabalin     REACTION: swelling  . Ropinirole  Hydrochloride     REACTION: swelling  . Amantadines Rash  . Penicillins Swelling and Rash    History   Social History  . Marital Status: Single    Spouse Name: N/A  . Number of Children: 4  . Years of Education: 12 th   Occupational History  . RetiredMedia planner    Social History Main Topics  . Smoking status: Former Smoker -- 3.00 packs/day for 20 years    Types: Cigarettes    Quit date: 07/06/1999  . Smokeless tobacco: Never Used  . Alcohol Use: No  . Drug Use: No  . Sexual Activity: Not Currently   Other Topics Concern  . Not on file   Social History Narrative   Health Care POA:    Emergency Contact: daughter, Brittany Roberts [phone] or Brittany Roberts [phone]   End of Life Plan: Does not want to be ventilated or feeding tubes.     Who lives with you: Lives alone   Any pets: none   Diet: Patient reports enjoying and eating junk food.  Does not regulate types of food and currently her dentures are broken so it is hard to eat some foods.   Exercise: Patient does not have an exercise plan.   Seatbelts: Patient reports wearing seatbelt when in vehicle.   Sun Exposure/Protection: Patient reports not going outside very often.   Hobbies: Patient enjoys reading the bible and watching game shows.    Right-handed.   1 cup caffeine per day.        Review of Systems: General: negative for chills, fever, night sweats or weight changes.  Cardiovascular: negative for chest pain, dyspnea on exertion, edema, orthopnea, palpitations, paroxysmal nocturnal dyspnea or shortness of breath Dermatological: negative for rash Respiratory: negative for cough or wheezing Urologic: negative for hematuria Abdominal: negative for nausea, vomiting, diarrhea, bright red blood per rectum, melena, or hematemesis Neurologic: negative for visual changes, syncope, or dizziness All other systems reviewed and are otherwise negative except as noted above.    Blood pressure 138/82, pulse 76, height 5\' 3"  (1.6 m), weight 278 lb 14.4 oz (126.508 kg).  General appearance: alert and no distress Neck: no adenopathy, no carotid bruit, no JVD, supple, symmetrical, trachea midline and thyroid not enlarged, symmetric, no tenderness/mass/nodules Lungs: clear to auscultation bilaterally Heart: regular rate and rhythm, S1, S2 normal, no murmur, click, rub or gallop Extremities: extremities normal, atraumatic, no cyanosis or edema and 1 cm shallow ulcer plantar surface left foot beneath left fifth metatarsal head  EKG not performed today  ASSESSMENT AND PLAN:   Penetrating foot wound Brittany Roberts was referred by Dr. Barkley Roberts, her podiatrist, for peripheral vascular evaluation because of a slowly healing left foot wound. This is on a pressure point on the plantar  surface under her fifth metatarsal. She thinks that she injured this or had some trauma in this assessment. Instrumented by Dr. Barkley Roberts and is slow to heal. She does have a palpable pedal pulse. I'm going to obtain lower extremity arterial Doppler studies.      Brittany Harp MD FACP,FACC,FAHA, [facility] 01/21/2015 11:18 AM '

## 2015-01-24 ENCOUNTER — Other Ambulatory Visit: Payer: Medicare Other

## 2015-01-24 DIAGNOSIS — N183 Chronic kidney disease, stage 3 unspecified: Secondary | ICD-10-CM

## 2015-01-24 DIAGNOSIS — N182 Chronic kidney disease, stage 2 (mild): Secondary | ICD-10-CM

## 2015-01-24 LAB — BASIC METABOLIC PANEL
BUN: 45 mg/dL — ABNORMAL HIGH (ref 6–23)
CO2: 23 mEq/L (ref 19–32)
Calcium: 9.8 mg/dL (ref 8.4–10.5)
Chloride: 107 mEq/L (ref 96–112)
Creat: 1.89 mg/dL — ABNORMAL HIGH (ref 0.50–1.10)
Glucose, Bld: 65 mg/dL — ABNORMAL LOW (ref 70–99)
Potassium: 5 mEq/L (ref 3.5–5.3)
Sodium: 139 mEq/L (ref 135–145)

## 2015-01-24 NOTE — Progress Notes (Signed)
Bmp done today Rodriquez Thorner

## 2015-01-27 NOTE — Addendum Note (Signed)
Addended by: Zenia Resides on: 01/27/2015 04:40 PM   Modules accepted: Orders

## 2015-01-27 NOTE — Progress Notes (Signed)
Patient ID: Brittany Roberts, female   DOB: 08-04-1940, 74 y.o.   MRN: [id number] Creat and K+ improved off ACE.  Stay off ACE.  Patient states she needs my office to call [place] to authorize a new walker.  Will have nurse call.  She does need a walker.  I am happy to sign Rx or any paperwork they fax.

## 2015-01-30 DIAGNOSIS — M65872 Other synovitis and tenosynovitis, left ankle and foot: Secondary | ICD-10-CM | POA: Diagnosis not present

## 2015-01-30 DIAGNOSIS — M7752 Other enthesopathy of left foot: Secondary | ICD-10-CM | POA: Diagnosis not present

## 2015-01-30 DIAGNOSIS — L97529 Non-pressure chronic ulcer of other part of left foot with unspecified severity: Secondary | ICD-10-CM | POA: Diagnosis not present

## 2015-02-07 NOTE — Progress Notes (Signed)
Spoke with [facility].  She will need a new Rx faxed to [facility].  Will forward to MD Fleeger, [name], [place]

## 2015-02-11 ENCOUNTER — Ambulatory Visit (HOSPITAL_COMMUNITY)
Admission: RE | Admit: 2015-02-11 | Discharge: 2015-02-11 | Disposition: A | Discharge status: Home / Self Care | Payer: Medicare Other | Source: Ambulatory Visit | Attending: Cardiovascular Disease | Admitting: Cardiovascular Disease

## 2015-02-11 DIAGNOSIS — E119 Type 2 diabetes mellitus without complications: Secondary | ICD-10-CM | POA: Diagnosis not present

## 2015-02-11 DIAGNOSIS — X58XXXA Exposure to other specified factors, initial encounter: Secondary | ICD-10-CM | POA: Diagnosis not present

## 2015-02-11 DIAGNOSIS — I2510 Atherosclerotic heart disease of native coronary artery without angina pectoris: Secondary | ICD-10-CM | POA: Diagnosis not present

## 2015-02-11 DIAGNOSIS — Z87891 Personal history of nicotine dependence: Secondary | ICD-10-CM | POA: Diagnosis not present

## 2015-02-11 DIAGNOSIS — I10 Essential (primary) hypertension: Secondary | ICD-10-CM | POA: Insufficient documentation

## 2015-02-11 DIAGNOSIS — J449 Chronic obstructive pulmonary disease, unspecified: Secondary | ICD-10-CM | POA: Insufficient documentation

## 2015-02-11 DIAGNOSIS — S81802A Unspecified open wound, left lower leg, initial encounter: Secondary | ICD-10-CM | POA: Diagnosis not present

## 2015-02-11 DIAGNOSIS — E785 Hyperlipidemia, unspecified: Secondary | ICD-10-CM | POA: Diagnosis not present

## 2015-02-11 DIAGNOSIS — I499 Cardiac arrhythmia, unspecified: Secondary | ICD-10-CM | POA: Diagnosis not present

## 2015-02-12 DIAGNOSIS — L97529 Non-pressure chronic ulcer of other part of left foot with unspecified severity: Secondary | ICD-10-CM | POA: Diagnosis not present

## 2015-02-12 DIAGNOSIS — J45909 Unspecified asthma, uncomplicated: Secondary | ICD-10-CM | POA: Diagnosis not present

## 2015-02-14 ENCOUNTER — Encounter: Payer: Self-pay | Admitting: Family Medicine

## 2015-02-14 ENCOUNTER — Ambulatory Visit (INDEPENDENT_AMBULATORY_CARE_PROVIDER_SITE_OTHER): Payer: Medicare Other | Admitting: Family Medicine

## 2015-02-14 VITALS — BP 118/57 | HR 74 | Temp 98.7°F | Ht 63.0 in | Wt 280.3 lb

## 2015-02-14 DIAGNOSIS — I10 Essential (primary) hypertension: Secondary | ICD-10-CM | POA: Diagnosis not present

## 2015-02-14 DIAGNOSIS — E079 Disorder of thyroid, unspecified: Secondary | ICD-10-CM

## 2015-02-14 DIAGNOSIS — R471 Dysarthria and anarthria: Secondary | ICD-10-CM

## 2015-02-14 DIAGNOSIS — G2401 Drug induced subacute dyskinesia: Secondary | ICD-10-CM

## 2015-02-14 DIAGNOSIS — E118 Type 2 diabetes mellitus with unspecified complications: Secondary | ICD-10-CM | POA: Diagnosis not present

## 2015-02-14 MED ORDER — TRAMADOL HCL 50 MG PO TABS: 50.0000 mg | ORAL_TABLET | Freq: Three times a day (TID) | ORAL | Status: DC

## 2015-02-14 MED ORDER — FUROSEMIDE 20 MG PO TABS: 20.0000 mg | ORAL_TABLET | Freq: Every day | ORAL | Status: DC

## 2015-02-14 MED ORDER — AMLODIPINE BESYLATE 10 MG PO TABS: 10.0000 mg | ORAL_TABLET | Freq: Every day | ORAL | Status: DC

## 2015-02-14 NOTE — Assessment & Plan Note (Signed)
Puts her at risk for CVA as a cause of her dysarthria/dysphagia.

## 2015-02-14 NOTE — Assessment & Plan Note (Signed)
I believe this is likely causing her symptoms, but want to rule out CVA now.  May need to revisit thyromegally depending on speech eval.

## 2015-02-14 NOTE — Progress Notes (Signed)
   Subjective:    Patient ID: Brittany Roberts, female    DOB: 1940/09/27, 74 y.o.   MRN: [id number]  HPI Patient with known tradive dyskinesia presents with slowly progressive (over months) problem with speech and with intermitant choking while eating.  Not clearly solid or liquid dysphagia.  No episodes of aspiration pneumonia.  To the best of her knowledge, the problem started slowly.   Has been seen recently by neuro but she did not complain to them about this symptom.   Recent CT of head did not show CVA Not other focal neuro deficits. Has had increased frequency of headaches.   On daily aspirin, does have CVA risk factors Known benign goiter which could be affecting her symptoms.    Review of Systems     Objective:   Physical ExamSpeech is dysarthric and definitely worse than one year ago.  No neck masses.  Swallowing water in the office no choking. Lungs clear         Assessment & Plan:

## 2015-02-14 NOTE — Assessment & Plan Note (Signed)
Perhaps playing a role in swallowing dysfunction.

## 2015-02-14 NOTE — Assessment & Plan Note (Addendum)
Likely due to tradive dyskinesia.  R/O CVA - she has several risk factors.  Also consider if thyromegally playing a role.

## 2015-02-14 NOTE — Patient Instructions (Signed)
The nurse will set up two things.   An MRI of your brain to make sure you don't have a stroke or a tumor. A speech therapy referral. I will call with the MRI results.

## 2015-02-21 ENCOUNTER — Ambulatory Visit (HOSPITAL_COMMUNITY)
Admission: RE | Admit: 2015-02-21 | Discharge: 2015-02-21 | Disposition: A | Discharge status: Home / Self Care | Payer: Medicare Other | Source: Ambulatory Visit | Attending: Family Medicine | Admitting: Family Medicine

## 2015-02-21 DIAGNOSIS — R51 Headache: Secondary | ICD-10-CM | POA: Diagnosis not present

## 2015-02-21 DIAGNOSIS — R471 Dysarthria and anarthria: Secondary | ICD-10-CM | POA: Insufficient documentation

## 2015-02-21 DIAGNOSIS — R4781 Slurred speech: Secondary | ICD-10-CM | POA: Diagnosis not present

## 2015-02-26 DIAGNOSIS — L97529 Non-pressure chronic ulcer of other part of left foot with unspecified severity: Secondary | ICD-10-CM | POA: Diagnosis not present

## 2015-03-12 DIAGNOSIS — L97529 Non-pressure chronic ulcer of other part of left foot with unspecified severity: Secondary | ICD-10-CM | POA: Diagnosis not present

## 2015-03-15 DIAGNOSIS — J45909 Unspecified asthma, uncomplicated: Secondary | ICD-10-CM | POA: Diagnosis not present

## 2015-03-20 DIAGNOSIS — E119 Type 2 diabetes mellitus without complications: Secondary | ICD-10-CM | POA: Diagnosis not present

## 2015-03-26 DIAGNOSIS — L97529 Non-pressure chronic ulcer of other part of left foot with unspecified severity: Secondary | ICD-10-CM | POA: Diagnosis not present

## 2015-03-31 ENCOUNTER — Ambulatory Visit: Payer: Medicare Other | Attending: Family Medicine

## 2015-03-31 ENCOUNTER — Other Ambulatory Visit: Payer: Self-pay | Admitting: Family Medicine

## 2015-03-31 DIAGNOSIS — R471 Dysarthria and anarthria: Secondary | ICD-10-CM

## 2015-03-31 DIAGNOSIS — R1310 Dysphagia, unspecified: Secondary | ICD-10-CM | POA: Insufficient documentation

## 2015-03-31 NOTE — Therapy (Signed)
[facility] [address] [place], [place], [address] Phone: [phone]   Fax:  [phone]  Speech Language Pathology Evaluation  Patient Details  Name: Brittany Roberts MRN: [id number] Date of Birth: 1941/02/18 Referring Provider:  Zenia Resides, MD  Encounter Date: 03/31/2015      End of Session - 03/31/15 1213    Visit Number 1   Number of Visits 16   Date for SLP Re-Evaluation 05/30/15   SLP Start Time 1105   SLP Stop Time  1145   SLP Time Calculation (min) 40 min   Activity Tolerance Patient tolerated treatment well      Past Medical History  Diagnosis Date  . Depression   . Lupus   . Asthma   . Diabetes mellitus   . Hypertension   . Hypothyroidism   . Congestive heart failure, unspecified   . Other and unspecified hyperlipidemia   . Unspecified vitamin D deficiency   . Renal failure, unspecified   . History of oxygen administration     oxygen use 2l/m nasally during the night  . Cataracts, both eyes   . OSA (obstructive sleep apnea)     no cpap used  . Peripheral neuropathy   . Penetrating foot wound     left nonhealing foot wound on the dorsal surface    Past Surgical History  Procedure Laterality Date  . Tubal ligation    . Nasal sinus surgery    . Gallbladder surgery    . Carpal tunnel release Bilateral   . Ankle arthroscopy with open reduction internal fixation (orif) Right     2-3 yrs ago-retained screws  . Cholecystectomy    . Colonoscopy N/A 12/10/2013    Procedure: COLONOSCOPY;  Surgeon: Inda Castle, MD;  Location: [facility];  Service: Endoscopy;  Laterality: N/A;  . Left heart catheterization with coronary angiogram N/A 08/08/2012    Procedure: LEFT HEART CATHETERIZATION WITH CORONARY ANGIOGRAM;  Surgeon: [name], MD;  Location: [facility] CATH LAB;  Service: Cardiovascular;  Laterality: N/A;  . Percutaneous coronary stent intervention (pci-s) N/A 08/21/2012    Procedure:  PERCUTANEOUS CORONARY STENT INTERVENTION (PCI-S);  Surgeon: [name], MD;  Location: [facility] CATH LAB;  Service: Cardiovascular;  Laterality: N/A;    There were no vitals filed for this visit.  Visit Diagnosis: Dysarthria  Dysphagia      Subjective Assessment - 03/31/15 1146    Currently in Pain? Yes   Pain Location Abdomen   Pain Orientation Mid   Pain Descriptors / Indicators Aching  "Catching"   Pain Type Acute pain   Pain Onset In the past 7 days   Pain Frequency Intermittent   Aggravating Factors  movement   Pain Relieving Factors meds            SLP Evaluation [facility] - 03/31/15 1120    SLP Visit Information   SLP Received On 03/31/15   Onset Date Became worse last 6 months   Medical Diagnosis Dysarthria   Subjective   Subjective Pt's voice mild-mod hoarse but pt states she has a cold; voice is not usually of this quality - better than today, usually.   General Information   Other Pertinent Information PMH positive for lupus   Prior Functional Status   Cognitive/Linguistic Baseline Within functional limits   Cognition   Overall Cognitive Status Within Functional Limits for tasks assessed   Auditory Comprehension   Overall Auditory Comprehension Appears within functional limits for  tasks assessed   Verbal Expression   Overall Verbal Expression Appears within functional limits for tasks assessed   Oral Motor/Sensory Function   Overall Oral Motor/Sensory Function Impaired   Labial ROM Reduced left   Labial Coordination Reduced  apraxic-like movements   Lingual ROM Reduced left   Lingual Symmetry Abnormal symmetry left   Lingual Strength Reduced Left   Facial Coordination WFL   Mandible Impaired   Overall Oral Motor/Sensory Function Somehwat difficult to assess due to extraneous movement of jaw, lips, and tongue   Motor Speech   Articulation Impaired   Level of Impairment Conversation   Intelligibility Intelligibility reduced   Conversation 75-100%  accurate  approx 95%   Motor Planning --      Pt with extraneous movement of jaw, lips, and tongue at rest and during speech tasks, but not noted as significantly during oral motor exam. Apraxic-like movement noted during alternate motion tasks with labial musculature.                   SLP Education - 04/18/2015 10/14/10    Education provided Yes   Education Details "Tardive dyskinesia", therapy course   Person(s) Educated Patient   Methods Explanation   Comprehension Verbalized understanding          SLP Short Term Goals - 04-18-2015 1228    SLP SHORT TERM GOAL #1   Title pt perform HEP for dysarthria (lt sided weakness) with occasional min A   Time 4   Period Weeks   Status New   SLP SHORT TERM GOAL #2   Title pt will demo intelligibility of >95% in simple conversation using strategies   Time 4   Period Weeks   Status New          SLP Long Term Goals - 18-Apr-2015 1229    SLP LONG TERM GOAL #1   Title pt will perform exercises for dysarthria with rare min A   Time 8   Period Weeks   Status New   SLP LONG TERM GOAL #2   Title pt will use strategies in mod complex conversation for intelligibilty >95% over three sessions   Time 8   Period Weeks   Status New          Plan - April 18, 2015 10/13/16    Clinical Impression Statement Pt with complex medical history presents today with mild dysarthria caused by what appears to be most like oral tardive dyskenisia. She reports coughing with meals and drooling from lt labial margin, and exhibits decr'd lt lingual strength and ROM, as well as decr'd lt labial ROM. A  modified barium swallow has been recommended to her referring MD. Pt would benefit from skilled ST to improve her intelligibility, and teach precautions and swallowing HEP after possible objective swallowing assessment.   Speech Therapy Frequency 2x / week   Duration --  8 weeks   Treatment/Interventions Pharyngeal strengthening exercises;Oral motor  exercises;Patient/family education;Compensatory strategies;SLP instruction and feedback;Functional tasks;Cueing hierarchy;Diet toleration management by SLP  swallowing interventions may not need to be used pending outcome of possible modified barium swallow   Potential to Achieve Goals Fair   Potential Considerations Co-morbidities;Severity of impairments   Consulted and Agree with Plan of Care Patient          G-Codes - 18-Apr-2015 October 13, 1200    Functional Assessment Tool Used noms - approx 15%-20% impaired   Functional Limitations Motor speech   Motor Speech Current Status [phone]) At least 1 percent but  less than 20 percent impaired, limited or restricted   Motor Speech Goal Status [phone]) At least 1 percent but less than 20 percent impaired, limited or restricted      Problem List Patient Active Problem List   Diagnosis Date Noted  . Dysarthria 02/14/2015  . Estrogen deficiency 01/10/2015  . Sinusitis, chronic 10/25/2014  . Schizophrenia, unspecified type 10/25/2014  . AKI (acute kidney injury)   . Type 2 diabetes mellitus with complication   . Cephalalgia   . Elevated sedimentation rate   . Hx of systemic lupus erythematosus (SLE)   . Thyroid mass 04/26/2014  . Acute renal failure superimposed on stage 3 chronic kidney disease 04/26/2014  . Dyspnea 04/15/2014  . Dry skin 04/06/2014  . Hyperkalemia 04/05/2014  . Abdominal pain, right lower quadrant 02/20/2014  . Unspecified constipation 12/10/2013  . Benign neoplasm of colon 12/10/2013  . Controlled type 2 diabetes with neuropathy 11/27/2013  . Cystic kidney disease 11/09/2013  . Generalized pain 11/09/2013  . Insulin dependent type 2 diabetes mellitus 10/29/2013  . Abdominal pain, unspecified site 09/27/2013  . Hoarseness or changing voice 09/27/2013  . Dyskinesia, tardive 07/11/2013  . Polymyalgia rheumatica 05/30/2013  . TMJ syndrome 02/16/2013  . Angina pectoris associated with type 2 diabetes mellitus 08/08/2012  . CAD  in native artery 08/08/2012  . Asthma, chronic, PRESUMED wiht MULTIFACTORIAL DYSPNEA 05/30/2012  . Insomnia 09/22/2011  . Mobility poor 02/02/2011  . Chronic kidney disease, stage III (moderate) 05/13/2010  . Chronic diastolic heart failure [id number]  . COPD (chronic obstructive pulmonary disease) 03/26/2010  . Obesity 02/27/2010  . DM (diabetes mellitus), type 2 with neurological complications [id number]  . OSTEOARTHRITIS 04/25/2009  . Anemia 03/11/2007  . COGNITIVE IMPAIRMENT, MILD, SO STATED 03/11/2007  . RESTLESS LEG SYNDROME 03/11/2007  . LUPUS ERYTHEMATOSUS, DISCOID 03/11/2007  . Hypothyroidism 03/07/2007  . DYSLIPIDEMIA 03/07/2007  . DEPRESSION 03/07/2007  . Essential hypertension 03/07/2007  . Coronary atherosclerosis 03/07/2007  . GERD 03/07/2007  . SLEEP APNEA 03/07/2007    [place] , MS, CCC-SLP  03/31/2015, 12:31 PM  [facility] [address] [place], [place], [address] Phone: [phone]   Fax:  [phone]

## 2015-04-12 ENCOUNTER — Other Ambulatory Visit: Payer: Self-pay | Admitting: Family Medicine

## 2015-04-14 DIAGNOSIS — J45909 Unspecified asthma, uncomplicated: Secondary | ICD-10-CM | POA: Diagnosis not present

## 2015-04-16 ENCOUNTER — Ambulatory Visit: Payer: Medicare Other | Attending: Family Medicine

## 2015-04-16 DIAGNOSIS — R471 Dysarthria and anarthria: Secondary | ICD-10-CM | POA: Insufficient documentation

## 2015-04-16 DIAGNOSIS — R1310 Dysphagia, unspecified: Secondary | ICD-10-CM | POA: Insufficient documentation

## 2015-04-16 NOTE — Patient Instructions (Signed)
Continue to slow it down and open up your mouth when you talk with ANYBODY! People will understand you better.   I'm glad this worked so well for you today in our session!

## 2015-04-16 NOTE — Therapy (Signed)
[facility] [address] [place], [place], [address] Phone: [phone]   Fax:  [phone]  Speech Language Pathology Treatment  Patient Details  Name: Brittany Roberts MRN: [id number] Date of Birth: 24-Dec-1940 Referring Provider:  Moses Manners, MD  Encounter Date: 04/16/2015      End of Session - 04/16/15 1155    Visit Number 2   Number of Visits 16   Date for SLP Re-Evaluation 05/30/15   SLP Start Time 1103   SLP Stop Time  1145   SLP Time Calculation (min) 42 min   Activity Tolerance Patient tolerated treatment well      Past Medical History  Diagnosis Date  . Depression   . Lupus   . Asthma   . Diabetes mellitus   . Hypertension   . Hypothyroidism   . Congestive heart failure, unspecified   . Other and unspecified hyperlipidemia   . Unspecified vitamin D deficiency   . Renal failure, unspecified   . History of oxygen administration     oxygen use 2l/m nasally during the night  . Cataracts, both eyes   . OSA (obstructive sleep apnea)     no cpap used  . Peripheral neuropathy   . Penetrating foot wound     left nonhealing foot wound on the dorsal surface    Past Surgical History  Procedure Laterality Date  . Tubal ligation    . Nasal sinus surgery    . Gallbladder surgery    . Carpal tunnel release Bilateral   . Ankle arthroscopy with open reduction internal fixation (orif) Right     2-3 yrs ago-retained screws  . Cholecystectomy    . Colonoscopy N/A 12/10/2013    Procedure: COLONOSCOPY;  Surgeon: Louis Meckel, MD;  Location: [facility];  Service: Endoscopy;  Laterality: N/A;  . Left heart catheterization with coronary angiogram N/A 08/08/2012    Procedure: LEFT HEART CATHETERIZATION WITH CORONARY ANGIOGRAM;  Surgeon: Pamella Pert, MD;  Location: [facility] CATH LAB;  Service: Cardiovascular;  Laterality: N/A;  . Percutaneous coronary stent intervention (pci-s) N/A 08/21/2012    Procedure:  PERCUTANEOUS CORONARY STENT INTERVENTION (PCI-S);  Surgeon: Pamella Pert, MD;  Location: [facility] CATH LAB;  Service: Cardiovascular;  Laterality: N/A;    There were no vitals filed for this visit.  Visit Diagnosis: Dysarthria  Dysphagia      Subjective Assessment - 04/16/15 1111    Subjective Pt finds herself more dyspnic than last month. Pt making appointment with Dr. Leveda Anna.                ADULT SLP TREATMENT - 04/16/15 1122    General Information   Behavior/Cognition Alert;Cooperative;Pleasant mood   Treatment Provided   Treatment provided Cognitive-Linquistic   Cognitive-Linquistic Treatment   Treatment focused on Dysarthria   Skilled Treatment Educated pt with compensatory measures for pt to "make her speech plainer." Pt repeated 10 sentences by SLP with 85% success with compensations, mainly due to remembering the 10-12 word sentence. When telling me about facts about family members she was approx 90% successful at using overarticulation and reduced rate for intelligibility at 100%. In simple conversation pt able to maintain intelligibility at 95-100% over 10 minutes.     Assessment / Recommendations / Plan   Plan Continue with current plan of care   Progression Toward Goals   Progression toward goals Progressing toward goals          SLP Education - 04/16/15  1154    Education provided Yes   Education Details compensatory measures of overarticulation and reduced rate to improve intelligibility   Person(s) Educated Patient   Methods Explanation;Demonstration   Comprehension Verbalized understanding;Returned demonstration          SLP Short Term Goals - 04/16/15 1159    SLP SHORT TERM GOAL #1   Title pt perform HEP for dysarthria (lt sided weakness) with occasional min A   Time 4   Period Weeks   Status On-going   SLP SHORT TERM GOAL #2   Title pt will demo intelligibility of >95% in 15 minutes of simple conversation using strategies, over 2 sessions    Time 4   Period Weeks   Status Revised          SLP Long Term Goals - 04/16/15 1159    SLP LONG TERM GOAL #1   Title pt will perform exercises for dysarthria with rare min A   Time 8   Period Weeks   Status On-going   SLP LONG TERM GOAL #2   Title pt will use strategies in 10 minutes mod complex conversation with intelligibilty >95% over three sessions   Time 8   Period Weeks   Status On-going          Plan - 04/16/15 1157    Clinical Impression Statement Pt was educated today re: compensatory measures for mild dysarthria caused by what appears to be most like oral tardive dyskenisia. A modified barium swallow has been recommended to her referring MD, and is pending scheduling at this time. Skilled ST to cont to ensure pt consistency with carryover of compenastory strategies to conversational speehc outside ST room, and to address any possible swallowing issues following pending modified barium swallow exam.    Speech Therapy Frequency 2x / week   Duration --  8 weeks   Treatment/Interventions Patient/family education;Compensatory strategies;SLP instruction and feedback;Functional tasks;Cueing hierarchy  swallowing interventions may not need to be used pending outcome of possible modified barium swallow   Potential to Achieve Goals Good   Potential Considerations Co-morbidities;Severity of impairments   Consulted and Agree with Plan of Care Patient        Problem List Patient Active Problem List   Diagnosis Date Noted  . Dysarthria 02/14/2015  . Estrogen deficiency 01/10/2015  . Sinusitis, chronic 10/25/2014  . Schizophrenia, unspecified type (HCC) 10/25/2014  . AKI (acute kidney injury) (HCC)   . Type 2 diabetes mellitus with complication (HCC)   . Cephalalgia   . Elevated sedimentation rate   . Hx of systemic lupus erythematosus (SLE)   . Thyroid mass 04/26/2014  . Acute renal failure superimposed on stage 3 chronic kidney disease (HCC) 04/26/2014  . Dyspnea  04/15/2014  . Dry skin 04/06/2014  . Hyperkalemia 04/05/2014  . Abdominal pain, right lower quadrant 02/20/2014  . Unspecified constipation 12/10/2013  . Benign neoplasm of colon 12/10/2013  . Controlled type 2 diabetes with neuropathy (HCC) 11/27/2013  . Cystic kidney disease 11/09/2013  . Generalized pain 11/09/2013  . Insulin dependent type 2 diabetes mellitus (HCC) 10/29/2013  . Abdominal pain, unspecified site 09/27/2013  . Hoarseness or changing voice 09/27/2013  . Dyskinesia, tardive 07/11/2013  . Polymyalgia rheumatica (HCC) 05/30/2013  . TMJ syndrome 02/16/2013  . Angina pectoris associated with type 2 diabetes mellitus (HCC) 08/08/2012  . CAD in native artery 08/08/2012  . Asthma, chronic, PRESUMED wiht MULTIFACTORIAL DYSPNEA 05/30/2012  . Insomnia 09/22/2011  . Mobility poor 02/02/2011  .  Chronic kidney disease, stage III (moderate) 05/13/2010  . Chronic diastolic heart failure (HCC) 03/31/2010  . COPD (chronic obstructive pulmonary disease) (HCC) 03/26/2010  . Obesity 02/27/2010  . DM (diabetes mellitus), type 2 with neurological complications (HCC) 04/25/2009  . OSTEOARTHRITIS 04/25/2009  . Anemia 03/11/2007  . COGNITIVE IMPAIRMENT, MILD, SO STATED 03/11/2007  . RESTLESS LEG SYNDROME 03/11/2007  . LUPUS ERYTHEMATOSUS, DISCOID 03/11/2007  . Hypothyroidism 03/07/2007  . DYSLIPIDEMIA 03/07/2007  . DEPRESSION 03/07/2007  . Essential hypertension 03/07/2007  . Coronary atherosclerosis 03/07/2007  . GERD 03/07/2007  . SLEEP APNEA 03/07/2007    Kimball Appleby , MS, CCC-SLP  04/16/2015, 12:00 PM  [facility] [address] [place], [place], [address] Phone: [phone]   Fax:  [phone]

## 2015-04-17 DIAGNOSIS — L97529 Non-pressure chronic ulcer of other part of left foot with unspecified severity: Secondary | ICD-10-CM | POA: Diagnosis not present

## 2015-04-19 ENCOUNTER — Other Ambulatory Visit: Payer: Self-pay | Admitting: Family Medicine

## 2015-04-21 ENCOUNTER — Other Ambulatory Visit: Payer: Self-pay | Admitting: *Deleted

## 2015-04-21 DIAGNOSIS — I10 Essential (primary) hypertension: Secondary | ICD-10-CM

## 2015-04-21 MED ORDER — FUROSEMIDE 20 MG PO TABS: 20.0000 mg | ORAL_TABLET | Freq: Every day | ORAL | Status: DC

## 2015-04-21 MED ORDER — TRAMADOL HCL 50 MG PO TABS: ORAL_TABLET | ORAL | Status: DC

## 2015-04-21 MED ORDER — ISOSORBIDE MONONITRATE ER 60 MG PO TB24: 60.0000 mg | ORAL_TABLET | Freq: Every day | ORAL | Status: DC

## 2015-04-22 ENCOUNTER — Other Ambulatory Visit: Payer: Self-pay | Admitting: *Deleted

## 2015-04-22 DIAGNOSIS — I10 Essential (primary) hypertension: Secondary | ICD-10-CM

## 2015-04-22 MED ORDER — PANTOPRAZOLE SODIUM 40 MG PO TBEC: 40.0000 mg | DELAYED_RELEASE_TABLET | Freq: Two times a day (BID) | ORAL | Status: DC

## 2015-04-22 MED ORDER — TRAZODONE HCL 50 MG PO TABS: ORAL_TABLET | ORAL | Status: DC

## 2015-04-22 MED ORDER — AMLODIPINE BESYLATE 10 MG PO TABS: 10.0000 mg | ORAL_TABLET | Freq: Every day | ORAL | Status: DC

## 2015-04-23 ENCOUNTER — Ambulatory Visit: Payer: Medicare Other

## 2015-04-23 MED ORDER — LEVOTHYROXINE SODIUM 50 MCG PO TABS: 50.0000 ug | ORAL_TABLET | Freq: Every day | ORAL | Status: DC

## 2015-04-25 ENCOUNTER — Ambulatory Visit: Payer: Medicare Other

## 2015-04-25 ENCOUNTER — Other Ambulatory Visit: Payer: Self-pay

## 2015-04-25 ENCOUNTER — Other Ambulatory Visit: Payer: Self-pay | Admitting: Family Medicine

## 2015-04-25 DIAGNOSIS — R1310 Dysphagia, unspecified: Secondary | ICD-10-CM

## 2015-04-25 DIAGNOSIS — R471 Dysarthria and anarthria: Secondary | ICD-10-CM | POA: Diagnosis not present

## 2015-04-25 DIAGNOSIS — R499 Unspecified voice and resonance disorder: Secondary | ICD-10-CM

## 2015-04-25 MED ORDER — DULOXETINE HCL 60 MG PO CPEP: 60.0000 mg | ORAL_CAPSULE | Freq: Every day | ORAL | Status: DC

## 2015-04-25 MED ORDER — GABAPENTIN 300 MG PO CAPS: 300.0000 mg | ORAL_CAPSULE | Freq: Two times a day (BID) | ORAL | Status: DC

## 2015-04-25 NOTE — Therapy (Signed)
[facility] [address] [place], [place], [address] Phone: [phone]   Fax:  [phone]  Speech Language Pathology Treatment  Patient Details  Name: Brittany Roberts MRN: [id number] Date of Birth: 1941/02/17 No Data Recorded  Encounter Date: 04/25/2015      End of Session - 04/25/15 1243    Visit Number 3   Number of Visits 16   Date for SLP Re-Evaluation 05/30/15   SLP Start Time 1104   SLP Stop Time  1144   SLP Time Calculation (min) 40 min      Past Medical History  Diagnosis Date  . Depression   . Lupus   . Asthma   . Diabetes mellitus   . Hypertension   . Hypothyroidism   . Congestive heart failure, unspecified   . Other and unspecified hyperlipidemia   . Unspecified vitamin D deficiency   . Renal failure, unspecified   . History of oxygen administration     oxygen use 2l/m nasally during the night  . Cataracts, both eyes   . OSA (obstructive sleep apnea)     no cpap used  . Peripheral neuropathy   . Penetrating foot wound     left nonhealing foot wound on the dorsal surface    Past Surgical History  Procedure Laterality Date  . Tubal ligation    . Nasal sinus surgery    . Gallbladder surgery    . Carpal tunnel release Bilateral   . Ankle arthroscopy with open reduction internal fixation (orif) Right     2-3 yrs ago-retained screws  . Cholecystectomy    . Colonoscopy N/A 12/10/2013    Procedure: COLONOSCOPY;  Surgeon: Louis Meckel, MD;  Location: [facility];  Service: Endoscopy;  Laterality: N/A;  . Left heart catheterization with coronary angiogram N/A 08/08/2012    Procedure: LEFT HEART CATHETERIZATION WITH CORONARY ANGIOGRAM;  Surgeon: Pamella Pert, MD;  Location: [facility] CATH LAB;  Service: Cardiovascular;  Laterality: N/A;  . Percutaneous coronary stent intervention (pci-s) N/A 08/21/2012    Procedure: PERCUTANEOUS CORONARY STENT INTERVENTION (PCI-S);  Surgeon: Pamella Pert, MD;   Location: [facility] CATH LAB;  Service: Cardiovascular;  Laterality: N/A;    There were no vitals filed for this visit.  Visit Diagnosis: Dysarthria  Dysphagia      Subjective Assessment - 04/25/15 1027    Subjective Pt told SLP she needs to make appointment with Dr. Leveda Anna next week about her dyspnea.               ADULT SLP TREATMENT - 04/25/15 1029    General Information   Behavior/Cognition Alert;Cooperative;Pleasant mood   Treatment Provided   Treatment provided Cognitive-Linquistic   Cognitive-Linquistic Treatment   Treatment focused on Dysarthria   Skilled Treatment Pt read for approx 90 seconds with 100% intelligible speech.  In conversation outside ST room x3, pt was understood by SLP >95% of the time. Pt is comfortable with discharge today.   Assessment / Recommendations / Plan   Plan Continue with current plan of care   Progression Toward Goals   Progression toward goals Progressing toward goals            SLP Short Term Goals - 04/25/15 1244    SLP SHORT TERM GOAL #1   Title pt perform HEP for dysarthria (lt sided weakness) with occasional min A   Time 4   Period Weeks   Status Deferred   SLP SHORT TERM GOAL #2  Title pt will demo intelligibility of >95% in 15 minutes of simple conversation using strategies, over 2 sessions   Time 4   Period Weeks   Status Partially Met  10 minutes last session, 20 minutes this session          SLP Long Term Goals - 2015/05/19 1246    SLP LONG TERM GOAL #1   Title pt will perform exercises for dysarthria with rare min A   Status Deferred   SLP LONG TERM GOAL #2   Status Partially Met  two sessions          Plan - 05-19-2015 1244    Clinical Impression Statement Pt has maintained excellent intelligibility with use of compensatory strategies in conversation outside of ST room and she states she is ready for discharge today. SLP agrees with pt.          G-Codes - 05/19/15 1246    Functional Assessment Tool  Used noms - approx 10% impaired   Functional Limitations Motor speech   Motor Speech Goal Status [phone]) At least 1 percent but less than 20 percent impaired, limited or restricted   Motor Speech Goal Status ([id number]) At least 1 percent but less than 20 percent impaired, limited or restricted      SPEECH THERAPY DISCHARGE SUMMARY  Visits from Start of Care: 3  Current functional level related to goals / functional outcomes: Pt's speech intelligibility improved over the course of treatment. Pt thought she was doing well enough with her speech to discharge today. SLP agrees. SLP would have liked to have kept pt long enough to have her on caseload so she could have modified barium swallow completed. SLP has suggested this to referring MD.    Remaining deficits: Mild dysarthria due to tardive dyskinesia. Likely mild dysphagia.   Education / Equipment: Compensatory measures for dysarthria.   Plan: Patient agrees to discharge.  Patient goals were partially met. Patient is being discharged due to the patient's request.  ?????   If swallowing therapy is recommended following a modified barium swallow exam, pt could receive that therapy at this facility.   Problem List Patient Active Problem List   Diagnosis Date Noted  . Dysarthria 02/14/2015  . Estrogen deficiency 01/10/2015  . Sinusitis, chronic 10/25/2014  . Schizophrenia, unspecified type (HCC) 10/25/2014  . AKI (acute kidney injury) (HCC)   . Type 2 diabetes mellitus with complication (HCC)   . Cephalalgia   . Elevated sedimentation rate   . Hx of systemic lupus erythematosus (SLE)   . Thyroid mass 04/26/2014  . Acute renal failure superimposed on stage 3 chronic kidney disease (HCC) 04/26/2014  . Dyspnea 04/15/2014  . Dry skin 04/06/2014  . Hyperkalemia 04/05/2014  . Abdominal pain, right lower quadrant 02/20/2014  . Unspecified constipation 12/10/2013  . Benign neoplasm of colon 12/10/2013  . Controlled type 2 diabetes  with neuropathy (HCC) 11/27/2013  . Cystic kidney disease 11/09/2013  . Generalized pain 11/09/2013  . Insulin dependent type 2 diabetes mellitus (HCC) 10/29/2013  . Abdominal pain, unspecified site 09/27/2013  . Hoarseness or changing voice 09/27/2013  . Dyskinesia, tardive 07/11/2013  . Polymyalgia rheumatica (HCC) 05/30/2013  . TMJ syndrome 02/16/2013  . Angina pectoris associated with type 2 diabetes mellitus (HCC) 08/08/2012  . CAD in native artery 08/08/2012  . Asthma, chronic, PRESUMED wiht MULTIFACTORIAL DYSPNEA 05/30/2012  . Insomnia 09/22/2011  . Mobility poor 02/02/2011  . Chronic kidney disease, stage III (moderate) 05/13/2010  . Chronic diastolic heart  failure (HCC) 03/31/2010  . COPD (chronic obstructive pulmonary disease) (HCC) 03/26/2010  . Obesity 02/27/2010  . DM (diabetes mellitus), type 2 with neurological complications (HCC) 04/25/2009  . OSTEOARTHRITIS 04/25/2009  . Anemia 03/11/2007  . COGNITIVE IMPAIRMENT, MILD, SO STATED 03/11/2007  . RESTLESS LEG SYNDROME 03/11/2007  . LUPUS ERYTHEMATOSUS, DISCOID 03/11/2007  . Hypothyroidism 03/07/2007  . DYSLIPIDEMIA 03/07/2007  . DEPRESSION 03/07/2007  . Essential hypertension 03/07/2007  . Coronary atherosclerosis 03/07/2007  . GERD 03/07/2007  . SLEEP APNEA 03/07/2007    Guillaume Weninger , MS, CCC-SLP  04/25/2015, 12:47 PM  [facility] [address] [place], [place], [address] Phone: [phone]   Fax:  [phone]   Name: Brittany Roberts MRN: [id number] Date of Birth: Feb 08, 1941

## 2015-04-28 ENCOUNTER — Ambulatory Visit: Payer: Medicare Other

## 2015-04-30 ENCOUNTER — Other Ambulatory Visit: Payer: Self-pay | Admitting: Family Medicine

## 2015-04-30 DIAGNOSIS — L97529 Non-pressure chronic ulcer of other part of left foot with unspecified severity: Secondary | ICD-10-CM | POA: Diagnosis not present

## 2015-04-30 DIAGNOSIS — Z794 Long term (current) use of insulin: Principal | ICD-10-CM

## 2015-04-30 DIAGNOSIS — E118 Type 2 diabetes mellitus with unspecified complications: Secondary | ICD-10-CM

## 2015-05-01 ENCOUNTER — Telehealth: Payer: Self-pay | Admitting: Family Medicine

## 2015-05-01 NOTE — Telephone Encounter (Signed)
Christa from [employer] is needing to know patients blood pressure from last office visit.

## 2015-05-05 DIAGNOSIS — I2510 Atherosclerotic heart disease of native coronary artery without angina pectoris: Secondary | ICD-10-CM | POA: Diagnosis not present

## 2015-05-05 DIAGNOSIS — I209 Angina pectoris, unspecified: Secondary | ICD-10-CM | POA: Diagnosis not present

## 2015-05-05 DIAGNOSIS — Z5309 Procedure and treatment not carried out because of other contraindication: Secondary | ICD-10-CM | POA: Diagnosis not present

## 2015-05-05 DIAGNOSIS — Z9861 Coronary angioplasty status: Secondary | ICD-10-CM | POA: Diagnosis not present

## 2015-05-08 ENCOUNTER — Ambulatory Visit (INDEPENDENT_AMBULATORY_CARE_PROVIDER_SITE_OTHER): Payer: Medicare Other | Admitting: Family Medicine

## 2015-05-08 ENCOUNTER — Other Ambulatory Visit (HOSPITAL_COMMUNITY): Payer: Self-pay | Admitting: Family Medicine

## 2015-05-08 ENCOUNTER — Telehealth: Payer: Self-pay

## 2015-05-08 ENCOUNTER — Encounter: Payer: Self-pay | Admitting: Family Medicine

## 2015-05-08 VITALS — BP 140/86 | HR 102 | Temp 98.6°F | Wt 269.9 lb

## 2015-05-08 DIAGNOSIS — R1310 Dysphagia, unspecified: Secondary | ICD-10-CM

## 2015-05-08 DIAGNOSIS — J441 Chronic obstructive pulmonary disease with (acute) exacerbation: Secondary | ICD-10-CM

## 2015-05-08 MED ORDER — LEVOFLOXACIN 25 MG/ML PO SOLN
ORAL | Status: DC
Start: 2015-05-08 — End: 2015-06-26

## 2015-05-08 NOTE — Patient Instructions (Signed)
Thank you for coming to see me today. It was a pleasure. Today we talked about:   COPD exacerbation: I am treating you with antibiotics. Please take them as directed for a total of 7 days (including today). If worsening symptoms, please return  If you have any questions or concerns, please do not hesitate to call the office at [phone].  Sincerely,  Cordelia Poche, MD

## 2015-05-08 NOTE — Progress Notes (Signed)
    Subjective   Brittany Roberts is a 74 y.o. female that presents for a same day visit  1. Cough: Symptoms started about 1 month ago. Symptoms include productive cough, post-nasal drip, sneezing any rhinorrhea. She has hot flashes and sweats. She has some dyspnea which has remained stable. No fevers, nausea or emesis.  ROS Per HPI  Social History  Substance Use Topics  . Smoking status: Former Smoker -- 3.00 packs/day for 20 years    Types: Cigarettes    Quit date: 07/06/1999  . Smokeless tobacco: Never Used  . Alcohol Use: No    Allergies  Allergen Reactions  . Ace Inhibitors     Coincided with sig bump in creat. Retried and creat bumped again.  Marland Kitchen Buspar [Buspirone]     pain  . Pregabalin     REACTION: swelling  . Ropinirole Hydrochloride     REACTION: swelling  . [place] Rash  . Penicillins Swelling and Rash    Objective   BP 140/86 mmHg  Pulse 102  Temp(Src) 98.6 F (37 C) (Oral)  Wt 269 lb 14.4 oz (122.426 kg)  General: Fair appearing, no distress HEENT: Pupils equal and reactive to light/accomodation. Extraocular movements intact bilaterally. Tympanic membranes normal bilaterally. Nares patent bilaterally. Oropharnx clear and moist. No cervical adenopathy bilaterally Respiratory/Chest: Clear to auscultation without wheezing or focal diminished breath sounds  Assessment and Plan   Meds ordered this encounter  Medications  . levofloxacin (LEVAQUIN) 25 MG/ML solution    Sig: Take 500mg  (57ml) today, then starting 05/09/2015, take 250mg  (65ml) daily for 6 additional days.    Dispense:  80 mL    Refill:  0    COPD exacerbation: patient with 2/3 criteria and higher risk secondary to age. Possible this is just a URI, but cannot rule out COPD exacerbation  Patient has stage 4 CKD. Will prescribe Levofloxacin 500mg  today, then 250mg  for the next 6 days  Continue albuterol PRN for coughing/dyspnea  Return precautions

## 2015-05-08 NOTE — Telephone Encounter (Signed)
Called and spoke to pt, informed her of a scheduled appt for barium swallow test on November 16th @ 1:00 PM at [facility]. Ottis Stain, CMA

## 2015-05-09 NOTE — Telephone Encounter (Signed)
lmovm for callback. Brittany Roberts, Brittany Roberts

## 2015-05-15 DIAGNOSIS — J45909 Unspecified asthma, uncomplicated: Secondary | ICD-10-CM | POA: Diagnosis not present

## 2015-05-16 DIAGNOSIS — I209 Angina pectoris, unspecified: Secondary | ICD-10-CM | POA: Diagnosis not present

## 2015-05-16 DIAGNOSIS — I2510 Atherosclerotic heart disease of native coronary artery without angina pectoris: Secondary | ICD-10-CM | POA: Diagnosis not present

## 2015-05-21 ENCOUNTER — Ambulatory Visit (HOSPITAL_COMMUNITY): Admission: RE | Admit: 2015-05-21 | Payer: Medicare Other | Source: Ambulatory Visit

## 2015-06-02 DIAGNOSIS — D2371 Other benign neoplasm of skin of right lower limb, including hip: Secondary | ICD-10-CM | POA: Diagnosis not present

## 2015-06-02 DIAGNOSIS — L97529 Non-pressure chronic ulcer of other part of left foot with unspecified severity: Secondary | ICD-10-CM | POA: Diagnosis not present

## 2015-06-02 DIAGNOSIS — M7750 Other enthesopathy of unspecified foot: Secondary | ICD-10-CM | POA: Diagnosis not present

## 2015-06-02 DIAGNOSIS — M2040 Other hammer toe(s) (acquired), unspecified foot: Secondary | ICD-10-CM | POA: Diagnosis not present

## 2015-06-02 DIAGNOSIS — D492 Neoplasm of unspecified behavior of bone, soft tissue, and skin: Secondary | ICD-10-CM | POA: Diagnosis not present

## 2015-06-03 DIAGNOSIS — R0602 Shortness of breath: Secondary | ICD-10-CM | POA: Diagnosis not present

## 2015-06-03 DIAGNOSIS — I2510 Atherosclerotic heart disease of native coronary artery without angina pectoris: Secondary | ICD-10-CM | POA: Diagnosis not present

## 2015-06-03 DIAGNOSIS — R079 Chest pain, unspecified: Secondary | ICD-10-CM | POA: Diagnosis not present

## 2015-06-05 ENCOUNTER — Ambulatory Visit (INDEPENDENT_AMBULATORY_CARE_PROVIDER_SITE_OTHER): Payer: Medicare Other | Admitting: Family Medicine

## 2015-06-05 ENCOUNTER — Encounter: Payer: Self-pay | Admitting: Family Medicine

## 2015-06-05 VITALS — BP 154/68 | HR 77 | Temp 98.4°F | Ht 63.0 in | Wt 271.6 lb

## 2015-06-05 DIAGNOSIS — I10 Essential (primary) hypertension: Secondary | ICD-10-CM | POA: Diagnosis not present

## 2015-06-05 DIAGNOSIS — M65939 Unspecified synovitis and tenosynovitis, unspecified forearm: Secondary | ICD-10-CM | POA: Insufficient documentation

## 2015-06-05 DIAGNOSIS — M65849 Other synovitis and tenosynovitis, unspecified hand: Secondary | ICD-10-CM

## 2015-06-05 DIAGNOSIS — E1140 Type 2 diabetes mellitus with diabetic neuropathy, unspecified: Secondary | ICD-10-CM

## 2015-06-05 DIAGNOSIS — M65839 Other synovitis and tenosynovitis, unspecified forearm: Secondary | ICD-10-CM | POA: Insufficient documentation

## 2015-06-05 DIAGNOSIS — Z23 Encounter for immunization: Secondary | ICD-10-CM | POA: Diagnosis not present

## 2015-06-05 DIAGNOSIS — M353 Polymyalgia rheumatica: Secondary | ICD-10-CM | POA: Diagnosis not present

## 2015-06-05 DIAGNOSIS — I5032 Chronic diastolic (congestive) heart failure: Secondary | ICD-10-CM

## 2015-06-05 DIAGNOSIS — E118 Type 2 diabetes mellitus with unspecified complications: Secondary | ICD-10-CM

## 2015-06-05 DIAGNOSIS — Z794 Long term (current) use of insulin: Secondary | ICD-10-CM

## 2015-06-05 DIAGNOSIS — F209 Schizophrenia, unspecified: Secondary | ICD-10-CM

## 2015-06-05 DIAGNOSIS — N183 Chronic kidney disease, stage 3 unspecified: Secondary | ICD-10-CM

## 2015-06-05 DIAGNOSIS — E1149 Type 2 diabetes mellitus with other diabetic neurological complication: Secondary | ICD-10-CM | POA: Diagnosis not present

## 2015-06-05 LAB — BASIC METABOLIC PANEL
BUN: 22 mg/dL (ref 7–25)
CO2: 27 mmol/L (ref 20–31)
Calcium: 9 mg/dL (ref 8.6–10.4)
Chloride: 102 mmol/L (ref 98–110)
Creat: 1.25 mg/dL — ABNORMAL HIGH (ref 0.60–0.93)
Glucose, Bld: 183 mg/dL — ABNORMAL HIGH (ref 65–99)
Potassium: 4 mmol/L (ref 3.5–5.3)
Sodium: 138 mmol/L (ref 135–146)

## 2015-06-05 LAB — POCT SEDIMENTATION RATE: POCT SED RATE: 64 mm/hr — AB (ref 0–22)

## 2015-06-05 LAB — POCT GLYCOSYLATED HEMOGLOBIN (HGB A1C): Hemoglobin A1C: 7.2

## 2015-06-05 MED ORDER — METOPROLOL SUCCINATE ER 50 MG PO TB24: 50.0000 mg | ORAL_TABLET | Freq: Every day | ORAL | Status: DC

## 2015-06-05 NOTE — Patient Instructions (Signed)
You will get a flu shot today. We will call with the hand surgeon referral. I will call with blood test results. I start you on one new medicine for your blood pressure and heart - a beta blocker called metoprolol See me in three months, sooner if problems

## 2015-06-06 NOTE — Assessment & Plan Note (Signed)
Once again addressed.

## 2015-06-06 NOTE — Assessment & Plan Note (Signed)
Foot exam confirms neuropathy.  No skin breakdown.

## 2015-06-06 NOTE — Assessment & Plan Note (Signed)
Stable on current meds 

## 2015-06-06 NOTE — Progress Notes (Signed)
   Subjective:    Patient ID: Brittany Roberts, female    DOB: [birth year], 74 y.o.   MRN: [id number]  HPI [name], as usual is a hard visit to organize.  Multiple issues. 1. DM not at goal.  Weight is up.  She is reluctant to take multiple injections per day. 2. BP is a bit up.   3. Needs foot exam 4. I have not received any eye exam results.  She assures me that she is going regularly 5. No DOE, orthopnea or ankle swelling.   6. Her main complain is bilateral wrist pain.  Especially at the base of her thumbs.  She also drops things from time to time.  She has hand pain but no hand numbness.    Review of Systems     Objective:   Physical Exam Lungs clear Cardiac RRR without m or g Abd benign Ext 1+ bilateral edema Both wrists quite painful with the worst pain over the thumb extensor tendons.  Provocative testing for carpal tunnel is neg. Diabetic foot exam done.        Assessment & Plan:

## 2015-06-06 NOTE — Assessment & Plan Note (Signed)
She is stable - but visit organization is hard with her heavy chronic disease burden.

## 2015-06-06 NOTE — Assessment & Plan Note (Signed)
Add metoprolol. 

## 2015-06-06 NOTE — Assessment & Plan Note (Signed)
Improved creat is nice.  She tells me she has been drinking more fluids.

## 2015-06-06 NOTE — Assessment & Plan Note (Signed)
Close to goal.  Focus on weight loss.

## 2015-06-06 NOTE — Assessment & Plan Note (Signed)
Exam consistent with extensor tenosynovitis.  Also at risk for carpal tunnel.  Will ask hand surgeon to see

## 2015-06-09 DIAGNOSIS — M2040 Other hammer toe(s) (acquired), unspecified foot: Secondary | ICD-10-CM | POA: Diagnosis not present

## 2015-06-11 DIAGNOSIS — I2510 Atherosclerotic heart disease of native coronary artery without angina pectoris: Secondary | ICD-10-CM | POA: Diagnosis not present

## 2015-06-11 DIAGNOSIS — I209 Angina pectoris, unspecified: Secondary | ICD-10-CM | POA: Diagnosis not present

## 2015-06-11 DIAGNOSIS — I10 Essential (primary) hypertension: Secondary | ICD-10-CM | POA: Diagnosis not present

## 2015-06-11 DIAGNOSIS — Z9861 Coronary angioplasty status: Secondary | ICD-10-CM | POA: Diagnosis not present

## 2015-06-13 ENCOUNTER — Telehealth: Payer: Self-pay | Admitting: *Deleted

## 2015-06-13 NOTE — Telephone Encounter (Signed)
Patient has appointment scheduled with Dr. Delilah Shan with [facility] on 06/19/15 at 10:30am. Patient is aware of appointment time and date.

## 2015-06-14 DIAGNOSIS — J45909 Unspecified asthma, uncomplicated: Secondary | ICD-10-CM | POA: Diagnosis not present

## 2015-06-19 DIAGNOSIS — M654 Radial styloid tenosynovitis [de Quervain]: Secondary | ICD-10-CM | POA: Diagnosis not present

## 2015-06-19 DIAGNOSIS — M1812 Unilateral primary osteoarthritis of first carpometacarpal joint, left hand: Secondary | ICD-10-CM | POA: Diagnosis not present

## 2015-06-19 DIAGNOSIS — M19049 Primary osteoarthritis, unspecified hand: Secondary | ICD-10-CM | POA: Insufficient documentation

## 2015-06-19 DIAGNOSIS — M1811 Unilateral primary osteoarthritis of first carpometacarpal joint, right hand: Secondary | ICD-10-CM | POA: Diagnosis not present

## 2015-06-24 DIAGNOSIS — I209 Angina pectoris, unspecified: Secondary | ICD-10-CM | POA: Diagnosis not present

## 2015-06-30 ENCOUNTER — Observation Stay (HOSPITAL_COMMUNITY)
Admission: RE | Admit: 2015-06-30 | Discharge: 2015-07-01 | Disposition: A | Discharge status: Home / Self Care | Payer: Medicare Other | Source: Ambulatory Visit | Attending: Cardiology | Admitting: Cardiology

## 2015-06-30 ENCOUNTER — Encounter (HOSPITAL_COMMUNITY): Payer: Self-pay | Admitting: General Practice

## 2015-06-30 DIAGNOSIS — I429 Cardiomyopathy, unspecified: Secondary | ICD-10-CM | POA: Diagnosis not present

## 2015-06-30 DIAGNOSIS — Z87891 Personal history of nicotine dependence: Secondary | ICD-10-CM | POA: Diagnosis not present

## 2015-06-30 DIAGNOSIS — R079 Chest pain, unspecified: Secondary | ICD-10-CM

## 2015-06-30 DIAGNOSIS — I25119 Atherosclerotic heart disease of native coronary artery with unspecified angina pectoris: Secondary | ICD-10-CM | POA: Insufficient documentation

## 2015-06-30 DIAGNOSIS — I5042 Chronic combined systolic (congestive) and diastolic (congestive) heart failure: Secondary | ICD-10-CM | POA: Diagnosis not present

## 2015-06-30 DIAGNOSIS — Z955 Presence of coronary angioplasty implant and graft: Secondary | ICD-10-CM | POA: Insufficient documentation

## 2015-06-30 DIAGNOSIS — E1122 Type 2 diabetes mellitus with diabetic chronic kidney disease: Secondary | ICD-10-CM | POA: Insufficient documentation

## 2015-06-30 DIAGNOSIS — E1121 Type 2 diabetes mellitus with diabetic nephropathy: Secondary | ICD-10-CM | POA: Insufficient documentation

## 2015-06-30 DIAGNOSIS — Z6841 Body Mass Index (BMI) 40.0 and over, adult: Secondary | ICD-10-CM | POA: Diagnosis not present

## 2015-06-30 DIAGNOSIS — E1165 Type 2 diabetes mellitus with hyperglycemia: Secondary | ICD-10-CM | POA: Diagnosis not present

## 2015-06-30 DIAGNOSIS — I440 Atrioventricular block, first degree: Secondary | ICD-10-CM | POA: Insufficient documentation

## 2015-06-30 DIAGNOSIS — I129 Hypertensive chronic kidney disease with stage 1 through stage 4 chronic kidney disease, or unspecified chronic kidney disease: Secondary | ICD-10-CM | POA: Diagnosis not present

## 2015-06-30 DIAGNOSIS — N183 Chronic kidney disease, stage 3 (moderate): Secondary | ICD-10-CM | POA: Insufficient documentation

## 2015-06-30 DIAGNOSIS — I209 Angina pectoris, unspecified: Secondary | ICD-10-CM

## 2015-06-30 HISTORY — DX: Procedure and treatment not carried out because of patient's decision for reasons of belief and group pressure: Z53.1

## 2015-06-30 HISTORY — DX: Atherosclerotic heart disease of native coronary artery without angina pectoris: I25.10

## 2015-06-30 HISTORY — DX: Dependence on supplemental oxygen: Z99.81

## 2015-06-30 HISTORY — DX: Other complications of anesthesia, initial encounter: T88.59XA

## 2015-06-30 HISTORY — DX: Systemic lupus erythematosus, unspecified: M32.9

## 2015-06-30 HISTORY — DX: Unspecified asthma, uncomplicated: J45.909

## 2015-06-30 HISTORY — DX: Pneumonia, unspecified organism: J18.9

## 2015-06-30 HISTORY — DX: Sickle-cell trait: D57.3

## 2015-06-30 HISTORY — DX: Bipolar disorder, unspecified: F31.9

## 2015-06-30 HISTORY — DX: Discoid lupus erythematosus: L93.0

## 2015-06-30 HISTORY — DX: Personal history of other diseases of the musculoskeletal system and connective tissue: Z87.39

## 2015-06-30 HISTORY — DX: Type 2 diabetes mellitus without complications: E11.9

## 2015-06-30 HISTORY — DX: Adverse effect of unspecified anesthetic, initial encounter: T41.45XA

## 2015-06-30 HISTORY — DX: Unspecified osteoarthritis, unspecified site: M19.90

## 2015-06-30 HISTORY — DX: Anxiety disorder, unspecified: F41.9

## 2015-06-30 HISTORY — DX: Reserved for inherently not codable concepts without codable children: IMO0001

## 2015-06-30 HISTORY — DX: Personal history of other diseases of the digestive system: Z87.19

## 2015-06-30 HISTORY — DX: Fibromyalgia: M79.7

## 2015-06-30 HISTORY — DX: Gastro-esophageal reflux disease without esophagitis: K21.9

## 2015-06-30 LAB — BASIC METABOLIC PANEL
Anion gap: 7 (ref 5–15)
BUN: 22 mg/dL — ABNORMAL HIGH (ref 6–20)
CO2: 29 mmol/L (ref 22–32)
Calcium: 8.7 mg/dL — ABNORMAL LOW (ref 8.9–10.3)
Chloride: 104 mmol/L (ref 101–111)
Creatinine, Ser: 1.32 mg/dL — ABNORMAL HIGH (ref 0.44–1.00)
GFR calc Af Amer: 45 mL/min — ABNORMAL LOW (ref >60)
GFR calc non Af Amer: 38 mL/min — ABNORMAL LOW (ref >60)
Glucose, Bld: 191 mg/dL — ABNORMAL HIGH (ref 65–99)
Potassium: 4 mmol/L (ref 3.5–5.1)
Sodium: 140 mmol/L (ref 135–145)

## 2015-06-30 LAB — CBC
HCT: 32.1 % — ABNORMAL LOW (ref 36.0–46.0)
Hemoglobin: 10.8 g/dL — ABNORMAL LOW (ref 12.0–15.0)
MCH: 23.9 pg — ABNORMAL LOW (ref 26.0–34.0)
MCHC: 33.6 g/dL (ref 30.0–36.0)
MCV: 71 fL — ABNORMAL LOW (ref 78.0–100.0)
Platelets: 201 10*3/uL (ref 150–400)
RBC: 4.52 MIL/uL (ref 3.87–5.11)
RDW: 15 % (ref 11.5–15.5)
WBC: 10.2 10*3/uL (ref 4.0–10.5)

## 2015-06-30 LAB — PROTIME-INR
INR: 1.05 (ref 0.00–1.49)
Prothrombin Time: 13.9 seconds (ref 11.6–15.2)

## 2015-06-30 LAB — GLUCOSE, CAPILLARY
Glucose-Capillary: 182 mg/dL — ABNORMAL HIGH (ref 65–99)
Glucose-Capillary: 203 mg/dL — ABNORMAL HIGH (ref 65–99)

## 2015-06-30 MED ORDER — SODIUM CHLORIDE 0.9 % WEIGHT BASED INFUSION
1.0000 mL/kg/h | INTRAVENOUS | Status: DC
  Administered 2015-06-30: 1 mL/kg/h via INTRAVENOUS

## 2015-06-30 MED ORDER — METOPROLOL SUCCINATE ER 50 MG PO TB24
50.0000 mg | ORAL_TABLET | Freq: Every day | ORAL | Status: DC
  Administered 2015-06-30 – 2015-07-01 (×2): 50 mg via ORAL
  Filled 2015-06-30 (×2): qty 1

## 2015-06-30 MED ORDER — SODIUM BICARBONATE BOLUS VIA INFUSION
INTRAVENOUS | Status: AC
  Administered 2015-07-01: 08:00:00 via INTRAVENOUS
  Filled 2015-06-30: qty 1

## 2015-06-30 MED ORDER — ALBUTEROL SULFATE (2.5 MG/3ML) 0.083% IN NEBU: 3.0000 mL | INHALATION_SOLUTION | RESPIRATORY_TRACT | Status: DC | PRN

## 2015-06-30 MED ORDER — SODIUM CHLORIDE 0.9 % IJ SOLN: 3.0000 mL | INTRAMUSCULAR | Status: DC | PRN

## 2015-06-30 MED ORDER — SODIUM CHLORIDE 0.9 % IV SOLN: 250.0000 mL | INTRAVENOUS | Status: DC | PRN

## 2015-06-30 MED ORDER — INSULIN NPH (HUMAN) (ISOPHANE) 100 UNIT/ML ~~LOC~~ SUSP
30.0000 [IU] | Freq: Every day | SUBCUTANEOUS | Status: DC
  Filled 2015-06-30: qty 10

## 2015-06-30 MED ORDER — AMLODIPINE BESYLATE 10 MG PO TABS
10.0000 mg | ORAL_TABLET | Freq: Every day | ORAL | Status: DC
  Administered 2015-07-01: 10 mg via ORAL
  Filled 2015-06-30: qty 1

## 2015-06-30 MED ORDER — TRAMADOL HCL 50 MG PO TABS
50.0000 mg | ORAL_TABLET | Freq: Three times a day (TID) | ORAL | Status: DC | PRN
  Administered 2015-06-30: 50 mg via ORAL
  Filled 2015-06-30: qty 1

## 2015-06-30 MED ORDER — DULOXETINE HCL 60 MG PO CPEP
60.0000 mg | ORAL_CAPSULE | Freq: Every day | ORAL | Status: DC
  Administered 2015-07-01: 60 mg via ORAL
  Filled 2015-06-30 (×2): qty 1

## 2015-06-30 MED ORDER — MOMETASONE FUROATE 0.1 % EX CREA
TOPICAL_CREAM | Freq: Every day | CUTANEOUS | Status: DC
  Filled 2015-06-30: qty 15

## 2015-06-30 MED ORDER — SODIUM BICARBONATE 8.4 % IV SOLN
INTRAVENOUS | Status: DC
  Filled 2015-06-30: qty 500

## 2015-06-30 MED ORDER — TRAZODONE HCL 50 MG PO TABS
50.0000 mg | ORAL_TABLET | Freq: Every day | ORAL | Status: DC
  Administered 2015-06-30: 50 mg via ORAL
  Filled 2015-06-30: qty 1

## 2015-06-30 MED ORDER — PANTOPRAZOLE SODIUM 40 MG PO TBEC
40.0000 mg | DELAYED_RELEASE_TABLET | Freq: Two times a day (BID) | ORAL | Status: DC
  Administered 2015-06-30 – 2015-07-01 (×2): 40 mg via ORAL
  Filled 2015-06-30 (×2): qty 1

## 2015-06-30 MED ORDER — GABAPENTIN 300 MG PO CAPS
300.0000 mg | ORAL_CAPSULE | Freq: Two times a day (BID) | ORAL | Status: DC
  Administered 2015-06-30 – 2015-07-01 (×2): 300 mg via ORAL
  Filled 2015-06-30 (×2): qty 1

## 2015-06-30 MED ORDER — LEVOTHYROXINE SODIUM 50 MCG PO TABS
50.0000 ug | ORAL_TABLET | Freq: Every day | ORAL | Status: DC
  Administered 2015-07-01: 50 ug via ORAL
  Filled 2015-06-30: qty 1

## 2015-06-30 MED ORDER — SODIUM CHLORIDE 0.9 % IJ SOLN
3.0000 mL | Freq: Two times a day (BID) | INTRAMUSCULAR | Status: DC
  Administered 2015-06-30: 3 mL via INTRAVENOUS

## 2015-06-30 MED ORDER — ISOSORBIDE MONONITRATE ER 60 MG PO TB24
120.0000 mg | ORAL_TABLET | Freq: Every day | ORAL | Status: DC
  Administered 2015-07-01: 120 mg via ORAL
  Filled 2015-06-30: qty 2

## 2015-06-30 MED ORDER — BUDESONIDE 0.25 MG/2ML IN SUSP
0.2500 mg | Freq: Two times a day (BID) | RESPIRATORY_TRACT | Status: DC
  Administered 2015-06-30: 0.25 mg via RESPIRATORY_TRACT
  Filled 2015-06-30 (×2): qty 2

## 2015-06-30 MED ORDER — ATORVASTATIN CALCIUM 20 MG PO TABS
20.0000 mg | ORAL_TABLET | Freq: Every day | ORAL | Status: DC
  Administered 2015-06-30: 20 mg via ORAL
  Filled 2015-06-30: qty 1

## 2015-06-30 MED ORDER — HEPARIN SODIUM (PORCINE) 5000 UNIT/ML IJ SOLN
5000.0000 [IU] | Freq: Three times a day (TID) | INTRAMUSCULAR | Status: DC
  Administered 2015-06-30: 5000 [IU] via SUBCUTANEOUS
  Filled 2015-06-30: qty 1

## 2015-06-30 MED ORDER — NITROGLYCERIN 0.4 MG SL SUBL: 0.4000 mg | SUBLINGUAL_TABLET | SUBLINGUAL | Status: DC | PRN

## 2015-06-30 MED ORDER — SODIUM BICARBONATE 8.4 % IV SOLN
INTRAVENOUS | Status: DC
  Filled 2015-06-30: qty 1000

## 2015-06-30 MED ORDER — ASPIRIN 81 MG PO CHEW
81.0000 mg | CHEWABLE_TABLET | ORAL | Status: AC
  Administered 2015-07-01: 81 mg via ORAL

## 2015-06-30 NOTE — H&P (Signed)
OFFICE VISIT NOTES COPIED TO EPIC FOR [name] 06/11/2015 8:25 AM Location: [facility] Patient #: [id number] DOB: 09-30-1940 Single / Language: Brittany Roberts / Race: Black or African American Female   History of Present Illness ([name] AGNP-C; 06/11/2015 12:00 PM) The patient is a 74 year old female who presents for a Follow-up for Coronary artery disease. She is an [place] American female with history of known coronary artery disease. She had undergone coronary angiography on 08/08/2012 and was found to have high-grade stenosis of the right coronary artery. Underwent angioplasty on 08/22/2012 to the right coronary artery/PDA branch with implantation of a 2.5 x 8 mm Xience Xpedition stent. She had been doing her best in weight loss, had lost about 13 pounds in weight over the past 5 months. She made an appointment to see [place] due to new onset of angina pectoris that started about 2 months ago, chest pain similar to angina pectoris prior to angiography and angioplasty. She had been using sublingual nitroglycerin, approximately 2-4 times per week. She was scheduled for echocardiogram and nuclear stress test and presents today for follow up.  Blood pressure has been difficult to control due to CKD and bradycardia with 1st degree AV block. She denies any dizziness, syncope, or nausea. She complains of chronic shortness of breath and dyspnea on exertion but there is no history to suggest PND. She does have mild orthopnea and reports an occasional sensation of fluttering in her chest but states this in infrequent. She does have mild chronic leg edema.   She has markedly limited lifestyle with regard to physical activity. No significant change in weight, denies any bleeding diathesis, no dark stools or tarry stools.   Problem List/Past Medical [name]; 06/11/2015 10:21 AM) Chronic combined systolic and diastolic HF (heart failure) (I50.42) Echo- 06/03/2015 1.  Left ventricle cavity is normal in size. Moderate concentric hypertrophy of the left ventricle. Moderate decrease in global wall motion. Visual EF is 35-40%. Doppler evidence of grade I (impaired) diastolic dysfunction with elevated LV filling pressure. Calculated EF 35%. 2. Left atrial cavity is mildly dilated. 3. Mild aortic valve leaflet calcification. Peak aortic velocity is borderline increased. 4. Mild mitral regurgitation. Mild calcification of the mitral valve annulus. 5. Mild tricuspid regurgitation. No evidence of pulmonary hypertension Hypertension, benign (I10) Labs 11/11/2014: Serum glucose 275, BUN 33, creatinine 1.74, eGFR 33, potassium 4.9 Labs 10/29/2014: serum glucose 146, BUN 34, creatinine 1.86, eGFR 30, potassium 4.3, HB 10.7/HCT 33.0 with microcytic indices Diabetes type 2, controlled (E11.9) Labs 10/29/2014: HbA1c 6.8% Hyperlipidemia, group A (E78.00) Labs 07/19/2014: Total cholesterol 103, triglycerides 81, HDL 38, LDL 49 Morbid or severe obesity with alveolar hypoventilation (E66.2) CAD S/P percutaneous coronary angioplasty (I25.10) 08/22/2012. PTCA and stenting of right coronary artery/PDA branch 2.5 x 8 mm Xience Xpedition stent. Other vessels mild disease. Normal LVEF. Lexiscan stress 06/16/12: Stress EKG was non diagnostic for ischemia. Perfusion imaging study demonstrates moderate sized inferior and apical scar with reversibility consistent with significant ischemia in the inferior and apical wall. The ejection fraction is normal. Diabetic nephropathy/sclerosis (E11.21) Labs 09/18/2012: Blood sugar elevated at 130 mg. BUN 23, serum creatinine 1.27. Estimated GFR 49 mL. Electrolytes are normal.. First degree AV block (I44.0) Bradycardia by electrocardiogram (R00.1) BMI 50.0-59.9, adult (M25.00) Angina pectoris (I20.9) Lexiscan myoview stress test 2 day protocol 05/19/2015: 1. The resting electrocardiogram demonstrated normal sinus rhythm, normal resting conduction, poor R  progression and low voltage and no resting arrhythmias. Stress EKG is non-diagnostic for  ischemia as it a pharmacologic stress using Lexiscan. Stress symptoms included chest burning. The stress test was terminated because of the end of the pharmacological testing. 2. The perfusion imaging study demonstrates a moderate to large size inferior inferolateral and inferoapical scar with no significant peri-infarct ischemia. Left ventricle systolic function calculated QGS was markedly depressed at 26%. This is an intermediate risk study, clinical correlation recommended. Compared to the study done on 06/16/2012, previously along with inferior wall scar the worst reversibility noted in the ejection fraction was normal. Beta-blockers contraindicated due to bradycardia (Z53.09) Arthritis (M19.90) multiple areas Acid reflux (K21.9) Depression (F32.9) Lupus (M32.9)  Allergies ([name]; 10-Jul-2015 10:21 AM) Penicillin G Benzathine & Proc *PENICILLINS* Hives. Lyrica *ANTICONVULSANTS* Swelling.  Family History ([name]; 2015/07/10 10:21 AM) Mother deceased at age 60 from blood clot in heart;hypertension;Type II Diabetes Mellitus. Father deceased at age 50 from pneumonia;no other medical history Siblings 8 (2 deceased brothers;one died from lung cancer;the other death was accidental.  Social History [name]; 07/10/15 10:21 AM) Current tobacco use Former smoker. quit in 2003 Non Drinker/No Alcohol Use Marital status Single. Living Situation lives alone Number of Children 4. 2 deceased  Past Surgical History ([name]; Jul 10, 2015 10:21 AM) Cholecystectomy in the 1970's Tubal Ligation in the 1970's Cataract Extraction-Bilateral03/2016  Medication History ([name]; 07/10/2015 10:37 AM) Isosorbide Mononitrate ER (120MG Tablet ER 24HR, 1 (one) Tablet Oral daily, Taken starting 05/05/2015) Active. Furosemide (20MG Tablet, 1 (one) Tablet Tablet Oral  daily, Taken starting 03/15/2015) Active. Nitrostat (0.4MG Tab Sublingual, 1 (one) Tab Sublingual Tab Sub Sublingual every 5 minutes as needed for chest pain., Taken starting 08/30/2012) Active. Albuterol Sulfate HFA (108 (90 Base)MCG/ACT Aerosol Soln, inhale 2 puffs Inhalation every four hours, as needed) Active. Norvasc (10MG Tablet, 1 Oral daily) Active. Fluticasone Propionate HFA (220MCG/ACT Aerosol, inhale 1 puff Inhalation two times daily) Active. (use even when feeling well) Gabapentin (600MG Tablet, 1 Oral two times daily) Active. (for Neuropathy) HumuLIN N (100UNIT/ML Suspension, inject 30 units Subcutaneous daily) Active. Isosorbide Mononitrate ER (60MG Tablet ER 24HR, 1 Oral daily) Active. Synthroid (50MCG Tablet, 1 Oral daily) Active. Ultram (50MG Tablet, 1 Oral three times daily) Active. TraZODone HCl (50MG Tablet, 1 Oral at bedtime as needed.) Active. Aspirin Childrens (81MG Tablet Chewable, 1 Oral daily) Active. Systane Balance (0.6% Solution, 1 drop ea eye Ophthalmic four times daily) Active. Protonix (40MG Tablet DR, 1 Oral daily) Active. Vitamin A (Free Text) (1 daily) Active. Vitamin D (1000UNIT Capsule, 1 Oral daily) Active. Polyethylene Glycol (Take 17 grams in 8oz of water daily as needed) Active. Flovent HFA (220MCG/ACT Aerosol, 2 puffs Inhalation two times daily) Active. Vitamin C (1000MG Tablet, 1 Oral daily) Active. Vitamin B6 (200MG Tablet, 1 Oral daily) Active. DULoxetine HCl (60MG Capsule DR Part, 1 Oral daily) Active. Atorvastatin Calcium (20MG Tablet, 1 Oral at bedtime) Active. B-D INS Syrings 0.5cc/30GX 1/2" 30 GX 1/2" 0.5 ML Mis (use as directed) Active. Ferrous Sulfate (325 (65 Fe)MG Tablet, 1 Oral daily) Active. Metoprolol Succinate ER (50MG Tablet ER 24HR, 1 Tablet ER 24HR Oral daily, Taken starting 02/08/2014) Active. Mometasone Furoate (0.1% Ointment, apply as directed External daily) Active. ProAir HFA (108 (90 Base)MCG/ACT  Aerosol Soln, 2 puffs Inhalation as needed) Active. Medications Reconciled (pcp med list present)  Diagnostic Studies History Adonis Brook Beane; 07-10-2015 8:26 AM) Echocardiogram11/29/2016 1. Left ventricle cavity is normal in size. Moderate concentric hypertrophy of the left ventricle. Moderate decrease in global wall motion. Visual EF is 35-40%. Doppler evidence of grade I (  impaired) diastolic dysfunction with elevated LV filling pressure. Calculated EF 35%. 2. Left atrial cavity is mildly dilated. 3. Mild aortic valve leaflet calcification. Peak aortic velocity is borderline increased. 4. Mild mitral regurgitation. Mild calcification of the mitral valve annulus. 5. Mild tricuspid regurgitation. No evidence of pulmonary hypertension Nuclear stress test11/14/2016 1. The resting electrocardiogram demonstrated normal sinus rhythm, normal resting conduction, poor R progression and low voltage and no resting arrhythmias. Stress EKG is non-diagnostic for ischemia as it a pharmacologic stress using Lexiscan. Stress symptoms included chest burning. The stress test was terminated because of the end of the pharmacological testing. 2. The perfusion imaging study demonstrates a moderate to large size inferior inferolateral and inferoapical scar with no significant peri-infarct ischemia. Left ventricle systolic function calculated QGS was markedly depressed at 26%. This is an intermediate risk study, clinical correlation recommended. Compared to the study done on 06/16/2012, previously along with inferior wall scar the worst reversibility noted in the ejection fraction was normal.  Other Problems ([place] Reader; 06/11/2015 10:21 AM) Hospitalized for ulcer on buttock in the 1970's.    Review of Systems ([name] AGNP-C; 06/11/2015 11:37 AM) General Present- Feeling well. Not Present- Fatigue and Fever. Respiratory Present- Decreased Exercise Tolerance and Dyspnea. Not Present- Cough. Cardiovascular  Present- Edema and Palpitations (infrequent). Not Present- Chest Pain, Orthopnea and Paroxysmal Nocturnal Dyspnea. Gastrointestinal Present- Belching, Excessive gas and Heartburn. Not Present- Change in Bowel Habits, Constipation and Nausea. Musculoskeletal Present- Backache, Joint Pain (knee and back) and Joint Stiffness (Patient had accidental fall and has ulcer in the left foot at the bottom of 5th toe). Neurological Not Present- Focal Neurological Symptoms. Endocrine Not Present- Appetite Changes, Cold Intolerance and Heat Intolerance. Hematology Not Present- Anemia, Petechiae and Prolonged Bleeding.  Vitals [name]; 06/11/2015 10:26 AM) 06/11/2015 10:23 AM Weight: 273.25 lb Height: 63in Body Surface Area: 2.21 m Body Mass Index: 48.4 kg/m  Pulse: 76 (Regular)  P.OX: 95% (Room air) BP: 148/80 (Sitting, Left Arm, Standard)       Physical Exam ([name], AGNP-C; 06/11/2015 12:5 PM) General Mental Status-Alert. General Appearance-Cooperative, Appears stated age, Not in acute distress. Orientation-Oriented X3. Build & Nutrition-Moderately built and Morbidly obese.  Head and Neck Thyroid Gland Characteristics - no palpable nodules, no palpable enlargement.  Chest and Lung Exam Palpation Tender - No chest wall tenderness. Auscultation Breath sounds - Clear.  Cardiovascular Inspection Jugular vein - Right - No Distention. Auscultation Heart Sounds - S1 WNL, S2 WNL and No gallop present. Murmurs & Other Heart Sounds - Murmur - No murmur.  Abdomen Inspection Contour - Obese and Pannus present. Palpation/Percussion Normal exam - Non Tender and No hepatosplenomegaly. Auscultation Normal exam - Bowel sounds normal.  Peripheral Vascular Lower Extremity Inspection - Left - No Pigmentation, No Varicose veins. Right - No Pigmentation, No Varicose veins. Palpation - Edema - Left - 2+ Pitting edema. Right - 2+ Pitting edema. Femoral pulse -  Bilateral - Normal. Popliteal pulse - Bilateral - Feeble(Pulsus difficult to feel due to patient's bodily habitus.). Dorsalis pedis pulse - Bilateral - Normal. Posterior tibial pulse - Bilateral - 1+. Note: unable to palpate due to edema. Carotid arteries - Bilateral-No Carotid bruit. Abdomen-No prominent abdominal aortic pulsation, No epigastric bruit.  Neurologic Motor-Grossly intact without any focal deficits.  Musculoskeletal Global Assessment Left Lower Extremity - No normal range of motion without pain(Patient had accidental fall and has ulcer in the left foot at the bottom of 5th toe). Right Lower Extremity - normal range of  motion without pain.    Assessment & Plan ([name] AGNP-C; 06/11/2015 12:04 PM) Angina pectoris (I20.9) Story: Lexiscan myoview stress test 2 day protocol 05/19/2015: 1. The resting electrocardiogram demonstrated normal sinus rhythm, normal resting conduction, poor R progression and low voltage and no resting arrhythmias. Stress EKG is non-diagnostic for ischemia as it a pharmacologic stress using Lexiscan. Stress symptoms included chest burning. The stress test was terminated because of the end of the pharmacological testing. 2. The perfusion imaging study demonstrates a moderate to large size inferior inferolateral and inferoapical scar with no significant peri-infarct ischemia. Left ventricle systolic function calculated QGS was markedly depressed at 26%. This is an intermediate risk study, clinical correlation recommended. Compared to the study done on 06/16/2012, previously along with inferior wall scar the worst reversibility noted in the ejection fraction was normal. CAD S/P percutaneous coronary angioplasty (I25.10) Story: 08/22/2012. PTCA and stenting of right coronary artery/PDA branch 2.5 x 8 mm Xience Xpedition stent. Other vessels mild disease. Normal LVEF.  Lexiscan stress 06/16/12: Stress EKG was non diagnostic for ischemia. Perfusion  imaging study demonstrates moderate sized inferior and apical scar with reversibility consistent with significant ischemia in the inferior and apical wall. The ejection fraction is normal. Impression: EKG 05/05/2015: Sinus tachycardia at the rate of 100 bpm with first-degree AV block, inferior infarct old, anterior infarct old. PVC. Low-voltage complexes. Baseline artifact. No significant change from EKG 10/31/2014. Hypertension, benign (I10) Story: Labs 11/11/2014: Serum glucose 275, BUN 33, creatinine 1.74, eGFR 33, potassium 4.9  Labs 10/29/2014: serum glucose 146, BUN 34, creatinine 1.86, eGFR 30, potassium 4.3, HB 10.7/HCT 33.0 with microcytic indices Morbid obesity with BMI of 45.0-49.9, adult (E66.01) Chronic combined systolic and diastolic HF (heart failure) (I50.42) Story: Echo- 06/03/2015 1. Left ventricle cavity is normal in size. Moderate concentric hypertrophy of the left ventricle. Moderate decrease in global wall motion. Visual EF is 35-40%. Doppler evidence of grade I (impaired) diastolic dysfunction with elevated LV filling pressure. Calculated EF 35%. 2. Left atrial cavity is mildly dilated. 3. Mild aortic valve leaflet calcification. Peak aortic velocity is borderline increased. 4. Mild mitral regurgitation. Mild calcification of the mitral valve annulus. 5. Mild tricuspid regurgitation. No evidence of pulmonary hypertension Current Plans Mechanism of underlying disease process and action of medications discussed with the patient. I discussed primary/secondary prevention and also dietary counseling was done. She presents for follow-up of echocardiogram and nuclear stress test. Echocardiogram revealed reduced LVEF of 35-40% compared to 63% 3 years ago. Stress test also revealed markedly depressed EF at 26% with a moderate to large size inferior inferolateral and inferoapical scar with no significant peri-infarct ischemia. She states her symptoms have slightly improved since beginning  Imdur, however she continues to report significant dyspnea and chest tightness on exertion. Schedule for cardiac catheterization to evaluate for ischemic cardiomyopathy with possible angioplasty. We discussed regarding risks, benefits, alternatives to this including stress testing, CTA and continued medical therapy. Patient wants to proceed. Understands <1-2% risk of death, stroke, MI, urgent CABG, bleeding, infection, renal failure but not limited to these. No changes in medications were made today. Office visit after cath.  *I have discussed this case with Dr. Einar Gip and he personally examined the patient and participated in formulating the plan.*    Signed by Neldon Labella, AGNP-C (06/11/2015 12:06 PM)

## 2015-06-30 NOTE — Progress Notes (Signed)
Called answering service for Dr. Einar Gip regarding new pt and admission orders. Had called Dr. Einar Gip earlier for the same . MD at cath lab at the time.

## 2015-07-01 ENCOUNTER — Encounter (HOSPITAL_COMMUNITY)
Admission: RE | Disposition: A | Discharge status: Home / Self Care | Payer: Self-pay | Source: Ambulatory Visit | Attending: Cardiology

## 2015-07-01 ENCOUNTER — Telehealth: Payer: Self-pay | Admitting: Family Medicine

## 2015-07-01 ENCOUNTER — Other Ambulatory Visit: Payer: Self-pay

## 2015-07-01 ENCOUNTER — Ambulatory Visit (HOSPITAL_COMMUNITY): Admission: RE | Admit: 2015-07-01 | Payer: Medicare Other | Source: Ambulatory Visit | Admitting: Cardiology

## 2015-07-01 ENCOUNTER — Encounter (HOSPITAL_COMMUNITY): Payer: Self-pay | Admitting: Cardiology

## 2015-07-01 DIAGNOSIS — N183 Chronic kidney disease, stage 3 (moderate): Secondary | ICD-10-CM | POA: Diagnosis not present

## 2015-07-01 DIAGNOSIS — I25119 Atherosclerotic heart disease of native coronary artery with unspecified angina pectoris: Secondary | ICD-10-CM | POA: Diagnosis not present

## 2015-07-01 DIAGNOSIS — Z955 Presence of coronary angioplasty implant and graft: Secondary | ICD-10-CM | POA: Diagnosis not present

## 2015-07-01 DIAGNOSIS — Z87891 Personal history of nicotine dependence: Secondary | ICD-10-CM | POA: Diagnosis not present

## 2015-07-01 DIAGNOSIS — I25118 Atherosclerotic heart disease of native coronary artery with other forms of angina pectoris: Secondary | ICD-10-CM | POA: Diagnosis not present

## 2015-07-01 DIAGNOSIS — I440 Atrioventricular block, first degree: Secondary | ICD-10-CM | POA: Diagnosis not present

## 2015-07-01 DIAGNOSIS — E1122 Type 2 diabetes mellitus with diabetic chronic kidney disease: Secondary | ICD-10-CM | POA: Diagnosis not present

## 2015-07-01 DIAGNOSIS — E1121 Type 2 diabetes mellitus with diabetic nephropathy: Secondary | ICD-10-CM | POA: Diagnosis not present

## 2015-07-01 DIAGNOSIS — I129 Hypertensive chronic kidney disease with stage 1 through stage 4 chronic kidney disease, or unspecified chronic kidney disease: Secondary | ICD-10-CM | POA: Diagnosis not present

## 2015-07-01 DIAGNOSIS — Z6841 Body Mass Index (BMI) 40.0 and over, adult: Secondary | ICD-10-CM | POA: Diagnosis not present

## 2015-07-01 DIAGNOSIS — I209 Angina pectoris, unspecified: Secondary | ICD-10-CM | POA: Diagnosis not present

## 2015-07-01 DIAGNOSIS — I5042 Chronic combined systolic (congestive) and diastolic (congestive) heart failure: Secondary | ICD-10-CM | POA: Diagnosis not present

## 2015-07-01 DIAGNOSIS — E1165 Type 2 diabetes mellitus with hyperglycemia: Secondary | ICD-10-CM | POA: Diagnosis not present

## 2015-07-01 DIAGNOSIS — I429 Cardiomyopathy, unspecified: Secondary | ICD-10-CM | POA: Diagnosis not present

## 2015-07-01 HISTORY — PX: CARDIAC CATHETERIZATION: SHX172

## 2015-07-01 LAB — GLUCOSE, CAPILLARY
Glucose-Capillary: 145 mg/dL — ABNORMAL HIGH (ref 65–99)
Glucose-Capillary: 243 mg/dL — ABNORMAL HIGH (ref 65–99)
Glucose-Capillary: 163 mg/dL — ABNORMAL HIGH (ref 65–99)

## 2015-07-01 LAB — BASIC METABOLIC PANEL
Anion gap: 4 — ABNORMAL LOW (ref 5–15)
BUN: 17 mg/dL (ref 6–20)
CO2: 30 mmol/L (ref 22–32)
Calcium: 8.7 mg/dL — ABNORMAL LOW (ref 8.9–10.3)
Chloride: 107 mmol/L (ref 101–111)
Creatinine, Ser: 1.17 mg/dL — ABNORMAL HIGH (ref 0.44–1.00)
GFR calc Af Amer: 51 mL/min — ABNORMAL LOW (ref >60)
GFR calc non Af Amer: 44 mL/min — ABNORMAL LOW (ref >60)
Glucose, Bld: 162 mg/dL — ABNORMAL HIGH (ref 65–99)
Potassium: 4.2 mmol/L (ref 3.5–5.1)
Sodium: 141 mmol/L (ref 135–145)

## 2015-07-01 LAB — POCT ACTIVATED CLOTTING TIME: Activated Clotting Time: 281 seconds

## 2015-07-01 SURGERY — LEFT HEART CATH AND CORONARY ANGIOGRAPHY
Anesthesia: LOCAL

## 2015-07-01 MED ORDER — LIDOCAINE HCL (PF) 1 % IJ SOLN
INTRAMUSCULAR | Status: DC | PRN
  Administered 2015-07-01: 3 mL via INTRA_ARTERIAL

## 2015-07-01 MED ORDER — IOHEXOL 350 MG/ML SOLN
INTRAVENOUS | Status: DC | PRN
  Administered 2015-07-01: 70 mL via INTRA_ARTERIAL

## 2015-07-01 MED ORDER — MIDAZOLAM HCL 2 MG/2ML IJ SOLN
INTRAMUSCULAR | Status: DC | PRN
  Administered 2015-07-01: 1 mg via INTRAVENOUS

## 2015-07-01 MED ORDER — ADENOSINE (DIAGNOSTIC) 3 MG/ML IV SOLN
20.0000 mL | Freq: Once | INTRAVENOUS | Status: DC
  Filled 2015-07-01: qty 20

## 2015-07-01 MED ORDER — SODIUM CHLORIDE 0.9 % WEIGHT BASED INFUSION: 1.0000 mL/kg/h | INTRAVENOUS | Status: AC

## 2015-07-01 MED ORDER — SODIUM CHLORIDE 0.9 % IJ SOLN
3.0000 mL | Freq: Two times a day (BID) | INTRAMUSCULAR | Status: DC
  Administered 2015-07-01: 3 mL via INTRAVENOUS

## 2015-07-01 MED ORDER — LISINOPRIL 10 MG PO TABS: 10.0000 mg | ORAL_TABLET | Freq: Every day | ORAL | Status: DC

## 2015-07-01 MED ORDER — HYDROMORPHONE HCL 1 MG/ML IJ SOLN
INTRAMUSCULAR | Status: DC | PRN
  Administered 2015-07-01 (×2): 0.5 mg via INTRAVENOUS

## 2015-07-01 MED ORDER — ADENOSINE (DIAGNOSTIC) 140MCG/KG/MIN
INTRAVENOUS | Status: DC | PRN
  Administered 2015-07-01: 140 ug/kg/min via INTRAVENOUS

## 2015-07-01 MED ORDER — SODIUM CHLORIDE 0.9 % IV SOLN: 250.0000 mL | INTRAVENOUS | Status: DC | PRN

## 2015-07-01 MED ORDER — VERAPAMIL HCL 2.5 MG/ML IV SOLN
INTRA_ARTERIAL | Status: DC | PRN
  Administered 2015-07-01: 5 mL via INTRA_ARTERIAL

## 2015-07-01 MED ORDER — HEPARIN (PORCINE) IN NACL 2-0.9 UNIT/ML-% IJ SOLN
INTRAMUSCULAR | Status: DC | PRN
  Administered 2015-07-01: 09:00:00

## 2015-07-01 MED ORDER — HEPARIN SODIUM (PORCINE) 1000 UNIT/ML IJ SOLN
INTRAMUSCULAR | Status: DC | PRN
  Administered 2015-07-01: 9000 [IU] via INTRAVENOUS

## 2015-07-01 MED ORDER — SODIUM CHLORIDE 0.9 % IJ SOLN: 3.0000 mL | INTRAMUSCULAR | Status: DC | PRN

## 2015-07-01 SURGICAL SUPPLY — 14 items
CATH INFINITI 5FR ANG PIGTAIL (CATHETERS) IMPLANT
CATH MICROCATH NAVVUS (MICROCATHETER) IMPLANT
CATH OPTITORQUE TIG 4.0 5F (CATHETERS) ×2 IMPLANT
CATH VISTA GUIDE 6FR JR4 (CATHETERS) ×1 IMPLANT
DEVICE RAD COMP TR BAND LRG (VASCULAR PRODUCTS) ×2 IMPLANT
GLIDESHEATH SLEND A-KIT 6F 20G (SHEATH) ×2 IMPLANT
HOVERMATT SINGLE USE (MISCELLANEOUS) ×1 IMPLANT
KIT HEART LEFT (KITS) ×2 IMPLANT
MICROCATHETER NAVVUS (MICROCATHETER) ×2
PACK CARDIAC CATHETERIZATION (CUSTOM PROCEDURE TRAY) ×2 IMPLANT
TRANSDUCER W/STOPCOCK (MISCELLANEOUS) ×2 IMPLANT
TUBING CIL FLEX 10 FLL-RA (TUBING) ×2 IMPLANT
WIRE COUGAR XT STRL 190CM (WIRE) ×1 IMPLANT
WIRE SAFE-T 1.5MM-J .035X260CM (WIRE) ×2 IMPLANT

## 2015-07-01 NOTE — Discharge Summary (Signed)
Physician Discharge Summary  Patient ID: Brittany Roberts MRN: [id number] DOB/AGE: [birth year] 74 y.o.  Admit date: 06/30/2015 Discharge date: 07/01/2015  Primary Discharge Diagnosis Non ischemic Cardiomyopathy Secondary Discharge Diagnosis CAD native vessel s/p stent to PDA of RCA HTN DM-2 uncontrolled Morbid obesity Significant Diagnostic Studies:  07/01/2015: Mid RCA 50% stenosis, FFR 0.84, not hemodynamically significant.PDA stent widely patent. Mild disease in the left coronary system, diffusely diseased apical LAD, LVEF 30%.  Hospital Course: patient admitted to the hospital for hydration due to chronic renal insufficiency and stage III chronic kidney disease, underwent coronary angiography the following morning for an abnormal stress test and chest pain suggestive of angina pectoris. He received a total of 70 mL of contrast. LVEDP at 30 millimeters mercury, hence adequately hydrated, hence felt stable for discharge with outpatient management.  Recommendations on discharge: will need evaluation and management of nonischemic cardiomyopathy.  Discharge Exam: Blood pressure 151/58, pulse 0, temperature 98.5 F (36.9 C), temperature source Oral, resp. rate 0, height 5' 2.99" (1.6 m), weight 122.789 kg (270 lb 11.2 oz), SpO2 0 %.   General appearance: alert, cooperative, appears stated age, no distress and morbidly obese Resp: clear to auscultation bilaterally Cardio: regular rate and rhythm, S1, S2 normal, no murmur, click, rub or gallop Extremities: extremities normal, atraumatic, no cyanosis or edema Neurologic: Grossly normal Labs:   Lab Results  Component Value Date   WBC 10.2 06/30/2015   HGB 10.8* 06/30/2015   HCT 32.1* 06/30/2015   MCV 71.0* 06/30/2015   PLT 201 06/30/2015    Recent Labs Lab 07/01/15 0525  NA 141  K 4.2  CL 107  CO2 30  BUN 17  CREATININE 1.17*  CALCIUM 8.7*  GLUCOSE 162*    Lipid Panel     Component Value Date/Time   CHOL 103 07/19/2014 1239    TRIG 81 07/19/2014 1239   HDL 38* 07/19/2014 1239   CHOLHDL 2.7 07/19/2014 1239   VLDL 16 07/19/2014 1239   LDLCALC 49 07/19/2014 1239   HEMOGLOBIN A1C Lab Results  Component Value Date   HGBA1C 7.2 06/05/2015   MPG 128* 03/30/2011    TSH  Recent Labs  07/19/14 1239 10/29/14 1100  TSH 1.049 0.207*    FOLLOW UP PLANS AND APPOINTMENTS    Medication List    STOP taking these medications        B-D INS SYRINGE 0.5CC/30GX1/2" 30G X 1/2" 0.5 ML Misc  Generic drug:  Insulin Syringe-Needle U-100     glucose blood test strip     spironolactone 25 MG tablet  Commonly known as:  ALDACTONE      TAKE these medications        amLODipine 10 MG tablet  Commonly known as:  NORVASC  Take 1 tablet (10 mg total) by mouth daily.     aspirin EC 81 MG tablet  Take 162 mg by mouth daily.     atorvastatin 20 MG tablet  Commonly known as:  LIPITOR  Take 1 tablet (20 mg total) by mouth daily.     B COMPLETE Tabs  Take 1 tablet by mouth daily.     DULoxetine 60 MG capsule  Commonly known as:  CYMBALTA  Take 1 capsule (60 mg total) by mouth daily.     FLOVENT HFA 220 MCG/ACT inhaler  Generic drug:  fluticasone  inhale 1 puff by mouth twice a day     furosemide 20 MG tablet  Commonly known as:  LASIX  Take 1  tablet (20 mg total) by mouth daily.     gabapentin 300 MG capsule  Commonly known as:  NEURONTIN  Take 1 capsule (300 mg total) by mouth 2 (two) times daily.     insulin NPH Human 100 UNIT/ML injection  Commonly known as:  HUMULIN N  Inject 0.3 mLs (30 Units total) into the skin every morning.     isosorbide mononitrate 120 MG 24 hr tablet  Commonly known as:  IMDUR  Take 120 mg by mouth daily.     levothyroxine 50 MCG tablet  Commonly known as:  SYNTHROID  Take 1 tablet (50 mcg total) by mouth daily.     lisinopril 10 MG tablet  Commonly known as:  PRINIVIL,ZESTRIL  Take 1 tablet (10 mg total) by mouth daily.  Start taking on:  07/04/2015      metoprolol succinate 50 MG 24 hr tablet  Commonly known as:  TOPROL-XL  Take 1 tablet (50 mg total) by mouth daily. Take with or immediately following a meal.     mometasone 0.1 % ointment  Commonly known as:  ELOCON  Apply topically daily.     multivitamin with minerals Tabs tablet  Take 1 tablet by mouth daily.     nitroGLYCERIN 0.4 MG SL tablet  Commonly known as:  NITROSTAT  Place 1 tablet (0.4 mg total) under the tongue every 5 (five) minutes as needed for chest pain.     pantoprazole 40 MG tablet  Commonly known as:  PROTONIX  Take 1 tablet (40 mg total) by mouth 2 (two) times daily.     PROAIR HFA 108 (90 BASE) MCG/ACT inhaler  Generic drug:  albuterol  inhale 2 puffs by mouth every 4 hours if needed USE ONLY IF YOU ARE WHEEZING     traMADol 50 MG tablet  Commonly known as:  ULTRAM  take 1 tablet by mouth three times a day if needed for pain     traZODone 50 MG tablet  Commonly known as:  DESYREL  take 1 tablet by mouth at bedtime if needed for sleep     vitamin A 10000 UNIT capsule  Take 10,000 Units by mouth daily.     VITAMIN C PO  Take 1 tablet by mouth daily.     Vitamin D3 5000 UNITS Caps  Take 5,000 Units by mouth daily.           Follow-up Information    Follow up with Adrian Prows, MD.   Specialty:  Cardiology   Why:  Keep previous appointment   Contact information:   [place]. [address] [place] [address] [phone]      Adrian Prows, MD 07/01/2015, 9:06 AM  Pager: [phone] Office: [phone] If no answer: [phone]

## 2015-07-01 NOTE — Telephone Encounter (Signed)
Perhaps this was already done by the cardiologist who discharged her.  If not, I am happy to order.

## 2015-07-01 NOTE — Interval H&P Note (Signed)
History and Physical Interval Note:  07/01/2015 7:57 AM  Brittany Roberts  has presented today for surgery, with the diagnosis of angina  The various methods of treatment have been discussed with the patient and family. After consideration of risks, benefits and other options for treatment, the patient has consented to  Procedure(s): Left Heart Cath and Coronary Angiography (N/A) and possible PCI  as a surgical intervention .  The patient's history has been reviewed, patient examined, no change in status, stable for surgery.  I have reviewed the patient's chart and labs.  Questions were answered to the patient's satisfaction.    Ischemic Symptoms? CCS III (Marked limitation of ordinary activity) Anti-ischemic Medical Therapy? Maximal Medical Therapy (2 or more classes of medications) Non-invasive Test Results? Intermediate-risk stress test findings: cardiac mortality 1-3%/year Prior CABG? No Previous CABG   Patient Information:   1-2V CAD, no prox LAD  A (8)  Indication: 17; Score: 8   Patient Information:   CTO of 1 vessel, no other CAD  A (7)  Indication: 27; Score: 7   Patient Information:   1V CAD with prox LAD  A (9)  Indication: 33; Score: 9   Patient Information:   2V-CAD with prox LAD  A (9)  Indication: 39; Score: 9   Patient Information:   3V-CAD without LMCA  A (9)  Indication: 45; Score: 9   Patient Information:   3V-CAD without LMCA With Abnormal LV systolic function  A (9)  Indication: 48; Score: 9   Patient Information:   LMCA-CAD  A (9)  Indication: 49; Score: 9   Patient Information:   2V-CAD with prox LAD PCI  A (7)  Indication: 62; Score: 7   Patient Information:   2V-CAD with prox LAD CABG  A (8)  Indication: 62; Score: 8   Patient Information:   3V-CAD without LMCA With Low CAD burden(i.e., 3 focal stenoses, low SYNTAX score) PCI  A (7)  Indication: 63; Score: 7   Patient Information:   3V-CAD without  LMCA With Low CAD burden(i.e., 3 focal stenoses, low SYNTAX score) CABG  A (9)  Indication: 63; Score: 9   Patient Information:   3V-CAD without LMCA E06c - Intermediate-high CAD burden (i.e., multiple diffuse lesions, presence of CTO, or high SYNTAX score) PCI  U (4)  Indication: 64; Score: 4   Patient Information:   3V-CAD without LMCA E06c - Intermediate-high CAD burden (i.e., multiple diffuse lesions, presence of CTO, or high SYNTAX score) CABG  A (9)  Indication: 64; Score: 9   Patient Information:   LMCA-CAD With Isolated LMCA stenosis  PCI  U (6)  Indication: 65; Score: 6   Patient Information:   LMCA-CAD With Isolated LMCA stenosis  CABG  A (9)  Indication: 65; Score: 9   Patient Information:   LMCA-CAD Additional CAD, low CAD burden (i.e., 1- to 2-vessel additional involvement, low SYNTAX score) PCI  U (5)  Indication: 66; Score: 5   Patient Information:   LMCA-CAD Additional CAD, low CAD burden (i.e., 1- to 2-vessel additional involvement, low SYNTAX score) CABG  A (9)  Indication: 66; Score: 9   Patient Information:   LMCA-CAD Additional CAD, intermediate-high CAD burden (i.e., 3-vessel involvement, presence of CTO, or high SYNTAX score) PCI  I (3)  Indication: 67; Score: 3   Patient Information:   LMCA-CAD Additional CAD, intermediate-high CAD burden (i.e., 3-vessel involvement, presence of CTO, or high SYNTAX score) CABG  A (9)  Indication: 67; Score:  [place]

## 2015-07-01 NOTE — Telephone Encounter (Signed)
Pt needs referral for a "rolalator" faxed to [facility] at [phone]. She states that she needs this done before she is discharged from the hospital today so that she has a ride to pick it up. Thank you, Fonda Kinder, ASA

## 2015-07-01 NOTE — Progress Notes (Signed)
Alerted to low BP at 79/65. Rechecked BP at 98/85. Will continue to monitor. Pt was talking and denies any problem in relation to low BP.

## 2015-07-03 NOTE — Telephone Encounter (Signed)
Pt calling to check status of request. I informed patient of below. She states that it is, in fact, a rolling walker. She states that her cardiologist did not order this for her yet. She would be pleased if her provider could please do so at the earliest convenience. Thank you, Fonda Kinder, ASA

## 2015-07-09 DIAGNOSIS — D2371 Other benign neoplasm of skin of right lower limb, including hip: Secondary | ICD-10-CM | POA: Diagnosis not present

## 2015-07-09 DIAGNOSIS — D492 Neoplasm of unspecified behavior of bone, soft tissue, and skin: Secondary | ICD-10-CM | POA: Diagnosis not present

## 2015-07-09 DIAGNOSIS — M65872 Other synovitis and tenosynovitis, left ankle and foot: Secondary | ICD-10-CM | POA: Diagnosis not present

## 2015-07-10 ENCOUNTER — Other Ambulatory Visit: Payer: Self-pay | Admitting: *Deleted

## 2015-07-10 NOTE — Telephone Encounter (Signed)
Handwritten rx provided

## 2015-07-10 NOTE — Telephone Encounter (Signed)
Will forward to MD. Jazmin Hartsell,CMA  

## 2015-07-10 NOTE — Telephone Encounter (Signed)
Rx faxed to [facility]. Katharina Caper, Lukis Bunt D, [name]

## 2015-07-10 NOTE — Progress Notes (Signed)
Opened in error. Amily Depp, [name]

## 2015-07-11 DIAGNOSIS — Z9861 Coronary angioplasty status: Secondary | ICD-10-CM | POA: Diagnosis not present

## 2015-07-11 DIAGNOSIS — I428 Other cardiomyopathies: Secondary | ICD-10-CM | POA: Diagnosis not present

## 2015-07-11 DIAGNOSIS — I209 Angina pectoris, unspecified: Secondary | ICD-10-CM | POA: Diagnosis not present

## 2015-07-11 DIAGNOSIS — I2510 Atherosclerotic heart disease of native coronary artery without angina pectoris: Secondary | ICD-10-CM | POA: Diagnosis not present

## 2015-07-15 ENCOUNTER — Other Ambulatory Visit: Payer: Self-pay | Admitting: *Deleted

## 2015-07-15 DIAGNOSIS — M654 Radial styloid tenosynovitis [de Quervain]: Secondary | ICD-10-CM | POA: Diagnosis not present

## 2015-07-15 DIAGNOSIS — J45909 Unspecified asthma, uncomplicated: Secondary | ICD-10-CM | POA: Diagnosis not present

## 2015-07-15 DIAGNOSIS — M1812 Unilateral primary osteoarthritis of first carpometacarpal joint, left hand: Secondary | ICD-10-CM | POA: Diagnosis not present

## 2015-07-15 DIAGNOSIS — M1811 Unilateral primary osteoarthritis of first carpometacarpal joint, right hand: Secondary | ICD-10-CM | POA: Diagnosis not present

## 2015-07-15 MED ORDER — LEVOTHYROXINE SODIUM 50 MCG PO TABS: 50.0000 ug | ORAL_TABLET | Freq: Every day | ORAL | Status: DC

## 2015-07-22 ENCOUNTER — Telehealth: Payer: Self-pay | Admitting: Family Medicine

## 2015-07-22 NOTE — Telephone Encounter (Signed)
Pt called and would like Dr. [name] to order her a Rolator since her cardiologist did not do this. jw

## 2015-07-23 DIAGNOSIS — M7989 Other specified soft tissue disorders: Secondary | ICD-10-CM | POA: Diagnosis not present

## 2015-07-24 NOTE — Telephone Encounter (Signed)
Pt calling and states that what was sent in was a regular walker. She states that she needs a Rollator.

## 2015-07-24 NOTE — Telephone Encounter (Signed)
I believe I already initiated a request for a rolling walker.

## 2015-07-25 NOTE — Telephone Encounter (Signed)
Done

## 2015-07-25 NOTE — Telephone Encounter (Signed)
I called [facility]. The Rx is for a rolling walker. The pt wants a walker with a seat. The Rx needs to be written for a Rolator or say "rolling walker with seat." Ottis Stain, CMA

## 2015-07-28 DIAGNOSIS — I5032 Chronic diastolic (congestive) heart failure: Secondary | ICD-10-CM | POA: Diagnosis not present

## 2015-07-28 DIAGNOSIS — J45909 Unspecified asthma, uncomplicated: Secondary | ICD-10-CM | POA: Diagnosis not present

## 2015-07-28 DIAGNOSIS — M25869 Other specified joint disorders, unspecified knee: Secondary | ICD-10-CM | POA: Diagnosis not present

## 2015-07-28 DIAGNOSIS — I10 Essential (primary) hypertension: Secondary | ICD-10-CM | POA: Diagnosis not present

## 2015-07-29 DIAGNOSIS — H40013 Open angle with borderline findings, low risk, bilateral: Secondary | ICD-10-CM | POA: Diagnosis not present

## 2015-08-06 DIAGNOSIS — E119 Type 2 diabetes mellitus without complications: Secondary | ICD-10-CM | POA: Diagnosis not present

## 2015-08-06 DIAGNOSIS — M7989 Other specified soft tissue disorders: Secondary | ICD-10-CM | POA: Diagnosis not present

## 2015-08-06 DIAGNOSIS — Z79899 Other long term (current) drug therapy: Secondary | ICD-10-CM | POA: Diagnosis not present

## 2015-08-06 DIAGNOSIS — M216X2 Other acquired deformities of left foot: Secondary | ICD-10-CM | POA: Diagnosis not present

## 2015-08-14 DIAGNOSIS — M1812 Unilateral primary osteoarthritis of first carpometacarpal joint, left hand: Secondary | ICD-10-CM | POA: Diagnosis not present

## 2015-08-14 DIAGNOSIS — M654 Radial styloid tenosynovitis [de Quervain]: Secondary | ICD-10-CM | POA: Diagnosis not present

## 2015-08-14 DIAGNOSIS — M1811 Unilateral primary osteoarthritis of first carpometacarpal joint, right hand: Secondary | ICD-10-CM | POA: Diagnosis not present

## 2015-08-14 DIAGNOSIS — G5602 Carpal tunnel syndrome, left upper limb: Secondary | ICD-10-CM | POA: Diagnosis not present

## 2015-08-15 DIAGNOSIS — J45909 Unspecified asthma, uncomplicated: Secondary | ICD-10-CM | POA: Diagnosis not present

## 2015-08-19 ENCOUNTER — Telehealth: Payer: Self-pay | Admitting: Family Medicine

## 2015-08-19 DIAGNOSIS — Z79899 Other long term (current) drug therapy: Secondary | ICD-10-CM | POA: Diagnosis not present

## 2015-08-19 DIAGNOSIS — E119 Type 2 diabetes mellitus without complications: Secondary | ICD-10-CM | POA: Diagnosis not present

## 2015-08-19 DIAGNOSIS — B351 Tinea unguium: Secondary | ICD-10-CM | POA: Diagnosis not present

## 2015-08-19 DIAGNOSIS — M7752 Other enthesopathy of left foot: Secondary | ICD-10-CM | POA: Diagnosis not present

## 2015-08-19 NOTE — Telephone Encounter (Signed)
Daughter dropped off FMLA form to be completed.  Please fax when done.

## 2015-08-20 ENCOUNTER — Other Ambulatory Visit: Payer: Self-pay | Admitting: Family Medicine

## 2015-08-20 DIAGNOSIS — E118 Type 2 diabetes mellitus with unspecified complications: Secondary | ICD-10-CM

## 2015-08-20 DIAGNOSIS — Z794 Long term (current) use of insulin: Principal | ICD-10-CM

## 2015-08-20 NOTE — Telephone Encounter (Signed)
Form placed in PCP box for completion. Zimmerman Rumple, Daisha Filosa D, CMA  

## 2015-08-21 ENCOUNTER — Telehealth: Payer: Self-pay | Admitting: Family Medicine

## 2015-08-21 NOTE — Telephone Encounter (Signed)
Patient's daughter [name] informed that FMLA forms are complete and ready for pick up. Forms faxed to [facility] per request.  Forms also copied for scanning in patient's record.  Derl Barrow, RN

## 2015-08-21 NOTE — Telephone Encounter (Signed)
I corrected my oversight.

## 2015-08-21 NOTE — Telephone Encounter (Signed)
Daughter calls, per her employer, #7 on page 4 was not completed on FMLA forms (frequency). Form has been placed back in Dr. Lowella Bandy box for completion. Please fax forms back once completed.

## 2015-08-21 NOTE — Telephone Encounter (Signed)
Dr. [name] has taken care of this.

## 2015-08-21 NOTE — Telephone Encounter (Signed)
Done

## 2015-08-25 ENCOUNTER — Encounter: Payer: Self-pay | Admitting: Family Medicine

## 2015-08-25 ENCOUNTER — Ambulatory Visit (INDEPENDENT_AMBULATORY_CARE_PROVIDER_SITE_OTHER): Payer: Medicare Other | Admitting: Family Medicine

## 2015-08-25 VITALS — BP 108/62 | HR 77 | Temp 98.9°F | Wt 274.0 lb

## 2015-08-25 DIAGNOSIS — J069 Acute upper respiratory infection, unspecified: Secondary | ICD-10-CM

## 2015-08-25 NOTE — Patient Instructions (Signed)
Good to see you today!  Thanks for coming in.  I think you have Sinus congestion   Use Nasal Saline - 2 puffs up each nostril at least 5 times a day   To open up the sinuses use Afrin nasal spray - 1 puff each side twice daily for 3 days only   If you are not feeling better in 1 weeks you should see Dr Mare Loan can buy saline nose drops at a pharmacy, or you can make your own saline solution:  1. Add 1 cup (240 mL) distilled water to a clean container. If you use tap water, boil it first to sterilize it, and then let it cool until it is lukewarm. 2. Add 0.5 tsp (2.5 g) salt to the water. 3. Add 0.5 tsp (2.5 g)baking soda.

## 2015-08-25 NOTE — Progress Notes (Signed)
   Subjective:    Patient ID: Brittany Roberts, female    DOB: [birth year], 75 y.o.   MRN: [id number]  HPI  URI  Has been sick for 3-4 days. Nasal discharge: clear with occsl blood  Medications tried: cough syryp helped a little Sick contacts: grandkids  Symptoms - did have vomiting 2-3 times that went away 36 hours ago.  No GI symptoms now Fever: no Headache or face pain: mild Tooth pain: no Sneezing: no Scratchy throat: yes - having to clear throat a lot and will occsl bring up a little blood Allergies: no Muscle aches: no Severe fatigue: no Stiff neck: no Shortness of breath: no Rash: no Sore throat or swollen glands: no   ROS see HPI Smoking Status noted    Review of Systems     Objective:   Physical Exam  Alert nad Neck:  No deformities, thyromegaly, masses, or tenderness noted.   Supple with full range of motion without pain. Lungs:  Normal respiratory effort, chest expands symmetrically. Lungs are clear to auscultation, no crackles or wheezes. Throat: normal mucosa, no exudate, uvula midline, no redness Ears:  External ear exam shows no significant lesions or deformities.  Otoscopic examination reveals large amount of wax bilaterally . Hearing is grossly normal bilaterall Sinus - maxillary nontender Nose:  External nasal examination shows no deformity  With mild redness nflammation.      Assessment & Plan:   URI - with nasal dryness and bleeding - see after visit summary .  No signs of bacterial infection

## 2015-09-02 DIAGNOSIS — M216X2 Other acquired deformities of left foot: Secondary | ICD-10-CM | POA: Diagnosis not present

## 2015-09-02 DIAGNOSIS — M7989 Other specified soft tissue disorders: Secondary | ICD-10-CM | POA: Diagnosis not present

## 2015-09-05 DIAGNOSIS — M7989 Other specified soft tissue disorders: Secondary | ICD-10-CM | POA: Diagnosis not present

## 2015-09-12 DIAGNOSIS — J45909 Unspecified asthma, uncomplicated: Secondary | ICD-10-CM | POA: Diagnosis not present

## 2015-09-18 ENCOUNTER — Ambulatory Visit: Payer: Medicare Other | Admitting: Family Medicine

## 2015-09-18 DIAGNOSIS — M7989 Other specified soft tissue disorders: Secondary | ICD-10-CM | POA: Diagnosis not present

## 2015-09-23 ENCOUNTER — Telehealth: Payer: Self-pay | Admitting: Family Medicine

## 2015-09-23 NOTE — Telephone Encounter (Signed)
Patient stopped taking her Bibil due to nausea and vomiting.  Having headaches daily. Restarted taking her isosorbide.   Patient was instructed to never stop taking her medication unless she has discussed with provider.  If needing to dscuss this with nurse can call Benjamine Mola at [facility]- [phone] ext [phone].  She is working in the [employer].

## 2015-09-23 NOTE — Telephone Encounter (Signed)
I called and she has an appointment with me on Thursday.  She will bring in meds at that time.

## 2015-09-25 ENCOUNTER — Ambulatory Visit (INDEPENDENT_AMBULATORY_CARE_PROVIDER_SITE_OTHER): Payer: Medicare Other | Admitting: Family Medicine

## 2015-09-25 VITALS — BP 128/78 | HR 76 | Temp 98.2°F | Ht 63.0 in | Wt 271.5 lb

## 2015-09-25 DIAGNOSIS — L304 Erythema intertrigo: Secondary | ICD-10-CM | POA: Diagnosis not present

## 2015-09-25 DIAGNOSIS — M5441 Lumbago with sciatica, right side: Secondary | ICD-10-CM | POA: Insufficient documentation

## 2015-09-25 DIAGNOSIS — F209 Schizophrenia, unspecified: Secondary | ICD-10-CM

## 2015-09-25 DIAGNOSIS — E1149 Type 2 diabetes mellitus with other diabetic neurological complication: Secondary | ICD-10-CM

## 2015-09-25 DIAGNOSIS — I5032 Chronic diastolic (congestive) heart failure: Secondary | ICD-10-CM

## 2015-09-25 DIAGNOSIS — E118 Type 2 diabetes mellitus with unspecified complications: Secondary | ICD-10-CM

## 2015-09-25 DIAGNOSIS — Z794 Long term (current) use of insulin: Secondary | ICD-10-CM

## 2015-09-25 LAB — POCT GLYCOSYLATED HEMOGLOBIN (HGB A1C): Hemoglobin A1C: 6.8

## 2015-09-25 MED ORDER — TRAMADOL HCL 50 MG PO TABS: ORAL_TABLET | ORAL | Status: DC

## 2015-09-25 NOTE — Patient Instructions (Signed)
Use the A&D ointment every day. For your leg and back pain, OK to take more tramadol.  One or two pills three times per day. Follow up with the hand surgeon about the pinched nerve.

## 2015-09-26 ENCOUNTER — Encounter: Payer: Self-pay | Admitting: Family Medicine

## 2015-09-26 DIAGNOSIS — L304 Erythema intertrigo: Secondary | ICD-10-CM | POA: Insufficient documentation

## 2015-09-26 NOTE — Progress Notes (Signed)
   Subjective:    Patient ID: Brittany Roberts, female    DOB: 01-Jun-1941, 75 y.o.   MRN: [id number]  HPI Multiple issues as always with [name].   1. DM - good with A1C at goal. 2. Morbid obesity unchanged.  No active because of both her choice and multiple osteoarthritis.   3. C/O skin rash in groin (intertriginous area)  A&D ointment works but the rash, irritation recurrs.   4. Was tried on bidil for her CHF.  Could not tolerate.  Now off.  Her DOE, orthopnea and ankle swelling, and weight are all at her baseline.      Review of Systems Denies chest pain.     Objective:   Physical ExamVS noted Lungs clear Abd: Intertrigo of the groin area. Ext 1+ edema.       Assessment & Plan:

## 2015-09-26 NOTE — Assessment & Plan Note (Signed)
Decent control despite her inability to tolerate bidil.

## 2015-09-26 NOTE — Assessment & Plan Note (Signed)
Once again advise increased activity and improved diet.

## 2015-09-26 NOTE — Assessment & Plan Note (Signed)
Instructed on local care.

## 2015-09-26 NOTE — Assessment & Plan Note (Signed)
Relaxed and non delusional throughout interview.  Improved.

## 2015-09-26 NOTE — Assessment & Plan Note (Signed)
Well controled. 

## 2015-10-13 ENCOUNTER — Other Ambulatory Visit: Payer: Self-pay | Admitting: *Deleted

## 2015-10-13 DIAGNOSIS — J45909 Unspecified asthma, uncomplicated: Secondary | ICD-10-CM | POA: Diagnosis not present

## 2015-10-13 MED ORDER — TRAZODONE HCL 50 MG PO TABS: 50.0000 mg | ORAL_TABLET | Freq: Every day | ORAL | Status: DC

## 2015-10-15 DIAGNOSIS — L603 Nail dystrophy: Secondary | ICD-10-CM | POA: Diagnosis not present

## 2015-10-15 DIAGNOSIS — M7989 Other specified soft tissue disorders: Secondary | ICD-10-CM | POA: Diagnosis not present

## 2015-10-15 DIAGNOSIS — I739 Peripheral vascular disease, unspecified: Secondary | ICD-10-CM | POA: Diagnosis not present

## 2015-10-15 DIAGNOSIS — L84 Corns and callosities: Secondary | ICD-10-CM | POA: Diagnosis not present

## 2015-10-15 DIAGNOSIS — E1051 Type 1 diabetes mellitus with diabetic peripheral angiopathy without gangrene: Secondary | ICD-10-CM | POA: Diagnosis not present

## 2015-10-30 ENCOUNTER — Telehealth: Payer: Self-pay | Admitting: Family Medicine

## 2015-10-30 DIAGNOSIS — H9193 Unspecified hearing loss, bilateral: Secondary | ICD-10-CM

## 2015-10-30 DIAGNOSIS — H9190 Unspecified hearing loss, unspecified ear: Secondary | ICD-10-CM | POA: Insufficient documentation

## 2015-10-30 NOTE — Telephone Encounter (Signed)
Need referral to Dr. Larena Sox.Nani Ravens.  Their fax number is [phone]

## 2015-10-31 NOTE — Telephone Encounter (Signed)
Spoke to pt. She has already made the appt with DR. Dellagrande. Ottis Stain, CMA

## 2015-11-04 DIAGNOSIS — H905 Unspecified sensorineural hearing loss: Secondary | ICD-10-CM | POA: Diagnosis not present

## 2015-11-06 ENCOUNTER — Ambulatory Visit (INDEPENDENT_AMBULATORY_CARE_PROVIDER_SITE_OTHER): Payer: Medicare Other | Admitting: Internal Medicine

## 2015-11-06 ENCOUNTER — Encounter: Payer: Self-pay | Admitting: Internal Medicine

## 2015-11-06 VITALS — BP 129/69 | HR 76 | Temp 98.2°F | Wt 297.6 lb

## 2015-11-06 DIAGNOSIS — K5900 Constipation, unspecified: Secondary | ICD-10-CM | POA: Diagnosis not present

## 2015-11-06 DIAGNOSIS — H6123 Impacted cerumen, bilateral: Secondary | ICD-10-CM | POA: Diagnosis not present

## 2015-11-06 MED ORDER — PSYLLIUM 28 % PO PACK
1.0000 | PACK | Freq: Two times a day (BID) | ORAL | Status: DC
Start: 2015-11-06 — End: 2016-05-13

## 2015-11-06 NOTE — Patient Instructions (Signed)
[name],  Thank you for coming in.   I recommend metamucil to bulk up your stools. I have prescribed it, but you may also find it over the counter. Continue to drink plenty of water.  Please bring in a sample if you ever have another worm-like thing from you bottom.  Call clinic for advice on how to take your lasix with any weight changes more than a few pounds.  Best, Dr. Ola Spurr

## 2015-11-06 NOTE — Progress Notes (Signed)
Subjective:    Patient ID: Brittany Roberts, female    DOB: 1940/10/18, 75 y.o.   MRN: [id number]  HPI  Brittany Roberts is a 75-y/o female with CHF, cognitive impairment, CKD, T2DM, HTN and hypothyroidism who presents with complaints of concern for worms and request for ear clean-out.   Concern for worms: Patient states that she has been concerned for years that she could have worms due to mostly R-sided abdominal pain that moves around her abdomen that she believes could indicate worms are moving in her GI tract. When wiping her bottom this past week, she saw a thin object about 3 cm long and white on one side and black striped on the other, which she thought could be a worm. She intended to bring it in to clinic but dropped it somewhere in her home. She denies hematochezia but does endorse pruritis of her bottom for past few weeks. She has not had any recent travel or exposure to animals. She has no pets. She had a colonoscopy in 2015 that was significant only for a polyp in the transverse colon that was read as hyperplastic. She has 2-3 small, somewhat loose bowel movements daily that are not hard, but she frequently strains and is unable to have a bowel movement. She has tried miralax as recently as a couple months ago without improvement in stooling. She does drink 8-10 glasses of water daily. She also reports having frequent gas.   Desire for ear clean-out: Notes worsened hearing.   CHF: Of note, patient mentioned large fluctuations in her weight of almost 10 pounds on a day-to-day basis. She says she weighs herself daily around the same time every morning after having a BM. However, she denies worsening shortness of breath or changes in LE edema.  Review of Systems  Respiratory: Positive for shortness of breath.   Gastrointestinal: Positive for abdominal pain (chronic) and constipation. Negative for diarrhea, blood in stool and rectal pain.  Genitourinary: Negative for dysuria.   Social: Former  smoker, lives by herself    Objective:   Physical Exam  Constitutional:  Obese female, in NAD; rolling walker with seat in room  Cardiovascular: Normal rate, regular rhythm and normal heart sounds.   No murmur heard. Pulmonary/Chest: Effort normal and breath sounds normal. No respiratory distress.  Talking in complete sentences.  Abdominal: Soft. Bowel sounds are normal. She exhibits no distension. There is tenderness (Diffuse, mild TTP across lower abdomen). There is no rebound and no guarding.  Genitourinary:  No gross blood with DRE. Thin, thready stool present in vault. No erythema of gluteal cleft or anus but with hyperpigmentation. Hyperpigmented, flat 2x1 cm area of induration but no fluctuance on left buttocks.   Musculoskeletal: She exhibits edema (1+ pitting edema to mid-shin).  Skin: Skin is warm and dry.      Assessment & Plan:  Abdominal pain sounds most consistent with constipation. No elevation in eosinophils on last CBC with diff in 2016, making helminth infection less likely. Description of what patient thought could be a worm is too large for pinworms but does not rule out tapeworm. Could consider stool parasite test in future if patient develops hematochezia or has another concerning bowel movement.  Advised patient to return PRN and to call [facility] for advice if she continues to experience large fluctuations day-to-day in her weight, as she may benefit from increasing her lasix doses at those times.  Chronic abdominal pain: - Recommended metamucil to help bulk up stools. -  Defer stool parasite test given low suspicion for helminth infection.  Cerumen impaction: - Could visualize normal eardrums after clean-out - Recommended Debrox wax softener  Olene Floss, MD [place], PGY-1

## 2015-11-12 DIAGNOSIS — J45909 Unspecified asthma, uncomplicated: Secondary | ICD-10-CM | POA: Diagnosis not present

## 2015-11-18 DIAGNOSIS — M654 Radial styloid tenosynovitis [de Quervain]: Secondary | ICD-10-CM | POA: Diagnosis not present

## 2015-11-18 DIAGNOSIS — M1812 Unilateral primary osteoarthritis of first carpometacarpal joint, left hand: Secondary | ICD-10-CM | POA: Diagnosis not present

## 2015-11-18 DIAGNOSIS — M1811 Unilateral primary osteoarthritis of first carpometacarpal joint, right hand: Secondary | ICD-10-CM | POA: Diagnosis not present

## 2015-11-18 DIAGNOSIS — G5602 Carpal tunnel syndrome, left upper limb: Secondary | ICD-10-CM | POA: Diagnosis not present

## 2015-12-01 ENCOUNTER — Telehealth: Payer: Self-pay | Admitting: Family Medicine

## 2015-12-01 NOTE — Telephone Encounter (Signed)
Pt. Calls because she says that she has had continued abdominal pain. She says this has been going on for over a year now. She says the pain is not getting worse, but that it just comes and goes. She says she gets intermittent nausea, but does not have vomiting. She is passing stools around two times / day with recent increase in her laxitives. She had been constipated recently but this is resolved. She denies fever or chills or eating suspicious foods.   Recommendations.  - Call in the morning 5/30 when the clinic is open to get an appointment.  - If pain becomes significantly worse, or vomiting develops then go to the ED or Urgent Care.  - Continue laxitives for now.   Paula Compton, MD Family Medicine - PGY 2

## 2015-12-02 ENCOUNTER — Telehealth: Payer: Self-pay | Admitting: *Deleted

## 2015-12-02 MED ORDER — TRAZODONE HCL 50 MG PO TABS: 50.0000 mg | ORAL_TABLET | Freq: Every day | ORAL | Status: DC

## 2015-12-02 NOTE — Telephone Encounter (Signed)
[employer] (pt enrolled in heart Failure program)  called to give [place] the same message as below.  States that she is down 5.2lbs and blames the metamucil.  States that she is having stomach pain (6/10 on pain scale) and has a decreased appetite because of the stomach pain. [place], Brittany Roberts, CMA.

## 2015-12-02 NOTE — Telephone Encounter (Signed)
Abd. Pain is improving.  Will observe for now. Also, confusion over last trazodone.  Rx written for 30 - she states pharmacy only gave 30.  Not a high risk medication and I believe her.  New rx sent.

## 2015-12-02 NOTE — Telephone Encounter (Signed)
Patient requesting to speak with MD. She states that Dr. Ola Spurr started her on metamucil to help with her constipation, she says it helps but now she is having some right sided abdominal pain that she would like to discus with MD.

## 2015-12-03 ENCOUNTER — Other Ambulatory Visit: Payer: Self-pay | Admitting: *Deleted

## 2015-12-03 DIAGNOSIS — I428 Other cardiomyopathies: Secondary | ICD-10-CM | POA: Diagnosis not present

## 2015-12-03 DIAGNOSIS — Z9861 Coronary angioplasty status: Secondary | ICD-10-CM | POA: Diagnosis not present

## 2015-12-03 DIAGNOSIS — I209 Angina pectoris, unspecified: Secondary | ICD-10-CM | POA: Diagnosis not present

## 2015-12-03 DIAGNOSIS — I2510 Atherosclerotic heart disease of native coronary artery without angina pectoris: Secondary | ICD-10-CM | POA: Diagnosis not present

## 2015-12-03 MED ORDER — ATORVASTATIN CALCIUM 20 MG PO TABS: 20.0000 mg | ORAL_TABLET | Freq: Every day | ORAL | Status: DC

## 2015-12-03 MED ORDER — PANTOPRAZOLE SODIUM 40 MG PO TBEC: 40.0000 mg | DELAYED_RELEASE_TABLET | Freq: Two times a day (BID) | ORAL | Status: DC

## 2015-12-03 MED ORDER — LISINOPRIL 10 MG PO TABS: 10.0000 mg | ORAL_TABLET | Freq: Every day | ORAL | Status: DC

## 2015-12-13 DIAGNOSIS — J45909 Unspecified asthma, uncomplicated: Secondary | ICD-10-CM | POA: Diagnosis not present

## 2015-12-16 ENCOUNTER — Other Ambulatory Visit: Payer: Self-pay | Admitting: *Deleted

## 2015-12-16 MED ORDER — DULOXETINE HCL 60 MG PO CPEP: 60.0000 mg | ORAL_CAPSULE | Freq: Every day | ORAL | Status: DC

## 2015-12-16 MED ORDER — GABAPENTIN 300 MG PO CAPS
300.0000 mg | ORAL_CAPSULE | Freq: Two times a day (BID) | ORAL | Status: DC
Start: 2015-12-16 — End: 2016-05-13

## 2015-12-18 ENCOUNTER — Other Ambulatory Visit: Payer: Self-pay | Admitting: *Deleted

## 2015-12-18 MED ORDER — PANTOPRAZOLE SODIUM 40 MG PO TBEC: 40.0000 mg | DELAYED_RELEASE_TABLET | Freq: Two times a day (BID) | ORAL | Status: DC

## 2015-12-18 MED ORDER — LISINOPRIL 10 MG PO TABS: 10.0000 mg | ORAL_TABLET | Freq: Every day | ORAL | Status: DC

## 2015-12-18 MED ORDER — ATORVASTATIN CALCIUM 20 MG PO TABS: 20.0000 mg | ORAL_TABLET | Freq: Every day | ORAL | Status: DC

## 2015-12-19 ENCOUNTER — Ambulatory Visit: Payer: Medicare Other | Admitting: Family Medicine

## 2015-12-24 ENCOUNTER — Telehealth: Payer: Self-pay | Admitting: Family Medicine

## 2015-12-24 DIAGNOSIS — I10 Essential (primary) hypertension: Secondary | ICD-10-CM

## 2015-12-24 DIAGNOSIS — G8918 Other acute postprocedural pain: Secondary | ICD-10-CM | POA: Diagnosis not present

## 2015-12-24 DIAGNOSIS — G5601 Carpal tunnel syndrome, right upper limb: Secondary | ICD-10-CM | POA: Diagnosis not present

## 2015-12-24 DIAGNOSIS — M654 Radial styloid tenosynovitis [de Quervain]: Secondary | ICD-10-CM | POA: Diagnosis not present

## 2015-12-24 DIAGNOSIS — M180 Bilateral primary osteoarthritis of first carpometacarpal joints: Secondary | ICD-10-CM | POA: Diagnosis not present

## 2015-12-24 DIAGNOSIS — M1811 Unilateral primary osteoarthritis of first carpometacarpal joint, right hand: Secondary | ICD-10-CM | POA: Diagnosis not present

## 2015-12-24 NOTE — Telephone Encounter (Signed)
Called.  Had carpal tunnel surgery today and is now home.  Per anesthesiology, K+=5.7 and BUN=50 - both much different than my last results in Dec.  She will come in tomorrow for lab work to verify.  Further changes based on those lab results.

## 2015-12-24 NOTE — Telephone Encounter (Signed)
Daughter [name] would like to talk to dr [name].  Pt is having surgery on her hand today.  The anesthesiologist  is concerned about her bloodwork--potassium, BUN.  He would like to compare with some recent blood work she has had here.

## 2015-12-25 ENCOUNTER — Other Ambulatory Visit: Payer: Medicare Other

## 2015-12-25 DIAGNOSIS — I10 Essential (primary) hypertension: Secondary | ICD-10-CM | POA: Diagnosis not present

## 2015-12-25 LAB — BASIC METABOLIC PANEL
BUN: 27 mg/dL — ABNORMAL HIGH (ref 7–25)
CO2: 20 mmol/L (ref 20–31)
Calcium: 9 mg/dL (ref 8.6–10.4)
Chloride: 108 mmol/L (ref 98–110)
Creat: 1.46 mg/dL — ABNORMAL HIGH (ref 0.60–0.93)
Glucose, Bld: 67 mg/dL (ref 65–99)
Potassium: 5 mmol/L (ref 3.5–5.3)
Sodium: 138 mmol/L (ref 135–146)

## 2015-12-29 DIAGNOSIS — M25531 Pain in right wrist: Secondary | ICD-10-CM | POA: Diagnosis not present

## 2015-12-29 DIAGNOSIS — M1812 Unilateral primary osteoarthritis of first carpometacarpal joint, left hand: Secondary | ICD-10-CM | POA: Diagnosis not present

## 2015-12-29 DIAGNOSIS — M654 Radial styloid tenosynovitis [de Quervain]: Secondary | ICD-10-CM | POA: Diagnosis not present

## 2015-12-29 DIAGNOSIS — G5602 Carpal tunnel syndrome, left upper limb: Secondary | ICD-10-CM | POA: Diagnosis not present

## 2015-12-29 DIAGNOSIS — M1811 Unilateral primary osteoarthritis of first carpometacarpal joint, right hand: Secondary | ICD-10-CM | POA: Diagnosis not present

## 2015-12-31 ENCOUNTER — Encounter: Payer: Self-pay | Admitting: Family Medicine

## 2015-12-31 ENCOUNTER — Telehealth: Payer: Self-pay | Admitting: *Deleted

## 2015-12-31 ENCOUNTER — Ambulatory Visit (INDEPENDENT_AMBULATORY_CARE_PROVIDER_SITE_OTHER): Payer: Medicare Other | Admitting: Family Medicine

## 2015-12-31 VITALS — BP 155/75 | HR 77 | Temp 98.4°F | Ht 63.0 in | Wt 284.8 lb

## 2015-12-31 DIAGNOSIS — N189 Chronic kidney disease, unspecified: Secondary | ICD-10-CM | POA: Diagnosis not present

## 2015-12-31 DIAGNOSIS — E118 Type 2 diabetes mellitus with unspecified complications: Secondary | ICD-10-CM | POA: Diagnosis not present

## 2015-12-31 DIAGNOSIS — M159 Polyosteoarthritis, unspecified: Secondary | ICD-10-CM | POA: Diagnosis not present

## 2015-12-31 DIAGNOSIS — J069 Acute upper respiratory infection, unspecified: Secondary | ICD-10-CM

## 2015-12-31 DIAGNOSIS — D631 Anemia in chronic kidney disease: Secondary | ICD-10-CM | POA: Diagnosis not present

## 2015-12-31 DIAGNOSIS — N183 Chronic kidney disease, stage 3 (moderate): Secondary | ICD-10-CM

## 2015-12-31 DIAGNOSIS — Z794 Long term (current) use of insulin: Secondary | ICD-10-CM

## 2015-12-31 DIAGNOSIS — N179 Acute kidney failure, unspecified: Secondary | ICD-10-CM

## 2015-12-31 DIAGNOSIS — F209 Schizophrenia, unspecified: Secondary | ICD-10-CM

## 2015-12-31 DIAGNOSIS — I10 Essential (primary) hypertension: Secondary | ICD-10-CM

## 2015-12-31 LAB — CBC
HCT: 30.5 % — ABNORMAL LOW (ref 35.0–45.0)
Hemoglobin: 9.9 g/dL — ABNORMAL LOW (ref 11.7–15.5)
MCH: 24 pg — ABNORMAL LOW (ref 27.0–33.0)
MCHC: 32.5 g/dL (ref 32.0–36.0)
MCV: 73.8 fL — ABNORMAL LOW (ref 80.0–100.0)
MPV: 9.5 fL (ref 7.5–12.5)
Platelets: 226 10*3/uL (ref 140–400)
RBC: 4.13 MIL/uL (ref 3.80–5.10)
RDW: 15 % (ref 11.0–15.0)
WBC: 8.9 10*3/uL (ref 3.8–10.8)

## 2015-12-31 LAB — COMPLETE METABOLIC PANEL WITH GFR
ALT: 14 U/L (ref 6–29)
AST: 21 U/L (ref 10–35)
Albumin: 3.6 g/dL (ref 3.6–5.1)
Alkaline Phosphatase: 140 U/L — ABNORMAL HIGH (ref 33–130)
BUN: 39 mg/dL — ABNORMAL HIGH (ref 7–25)
CO2: 19 mmol/L — ABNORMAL LOW (ref 20–31)
Calcium: 8 mg/dL — ABNORMAL LOW (ref 8.6–10.4)
Chloride: 105 mmol/L (ref 98–110)
Creat: 2.48 mg/dL — ABNORMAL HIGH (ref 0.60–0.93)
GFR, Est African American: 21 mL/min — ABNORMAL LOW (ref >=60)
GFR, Est Non African American: 18 mL/min — ABNORMAL LOW (ref >=60)
Glucose, Bld: 127 mg/dL — ABNORMAL HIGH (ref 65–99)
Potassium: 5.6 mmol/L — ABNORMAL HIGH (ref 3.5–5.3)
Sodium: 133 mmol/L — ABNORMAL LOW (ref 135–146)
Total Bilirubin: 0.3 mg/dL (ref 0.2–1.2)
Total Protein: 7.8 g/dL (ref 6.1–8.1)

## 2015-12-31 LAB — POCT GLYCOSYLATED HEMOGLOBIN (HGB A1C): Hemoglobin A1C: 6.8

## 2015-12-31 MED ORDER — GUAIFENESIN-CODEINE 100-10 MG/5ML PO SOLN: 5.0000 mL | Freq: Three times a day (TID) | ORAL | Status: DC | PRN

## 2015-12-31 MED ORDER — DICLOFENAC SODIUM 1 % TD GEL: 2.0000 g | Freq: Four times a day (QID) | TRANSDERMAL | Status: DC | PRN

## 2015-12-31 NOTE — Telephone Encounter (Signed)
Prior Authorization received from [facility] for Diclofenac 1% gel.  PA form placed in provider box for completion. Derl Barrow, RN

## 2015-12-31 NOTE — Patient Instructions (Signed)
Good seeing you Drop by the paperwork for the scooter. I will call with the blood test results. I want to see you again in one month. Two prescriptions: a cough medicine and a pain cream.

## 2016-01-01 DIAGNOSIS — N189 Chronic kidney disease, unspecified: Secondary | ICD-10-CM

## 2016-01-01 DIAGNOSIS — N179 Acute kidney failure, unspecified: Secondary | ICD-10-CM | POA: Insufficient documentation

## 2016-01-01 MED ORDER — AMLODIPINE BESYLATE 10 MG PO TABS: 10.0000 mg | ORAL_TABLET | Freq: Every day | ORAL | Status: DC

## 2016-01-01 NOTE — Assessment & Plan Note (Signed)
Disjoint history and unrealistic expectations such as with the scooter, makes it difficult to organize the visit.  She seems at her baseline to me.  Certainly not a threat to self or others.  Does have support at home (has home health aid.)

## 2016-01-01 NOTE — Assessment & Plan Note (Signed)
Wt is in her usual range.

## 2016-01-01 NOTE — Assessment & Plan Note (Signed)
Surprised by sig bump in creat and return of hyperkalemia.  Called.  Drinking fluids well.  Not taking NSAIDs.  Will stop lisinopril and furosemide and recheck labs in 5 days/

## 2016-01-01 NOTE — Assessment & Plan Note (Signed)
At goal.  No change 

## 2016-01-01 NOTE — Assessment & Plan Note (Signed)
Surprised to see jump in creat and hyperkalemia.  Called and told to stop lisinopril and lasix.  Recheck BMP in four days.

## 2016-01-01 NOTE — Telephone Encounter (Signed)
Form completed and returned to [facility].

## 2016-01-01 NOTE — Progress Notes (Signed)
   Subjective:    Patient ID: Brittany Roberts, female    DOB: 07-Aug-1940, 75 y.o.   MRN: [id number]  HPI Complicated with multiple issues: 1. Recent surg (6/21//17) on right wrist/thumb.  Was told she had a high potassium at that time.  Check next day was OK.   2. Patient has Right leg (ankle, leg, up to hip) pain.  Known diffuse osteoarthritis and neuropathy.  Tried a friend's voltaren gel.  It worked Engineer, manufacturing. 3. Has a "bad cold"  Nasal congestion.  No SOB or fever.  Chronic cough unchanged.  With this bad cold, she has had one week of difficulty swallowing.   4. Due for A1C check of DM.   5. "You need to OK me to have a scooter."  "All you need to do is say it is OK."  Wants scooter due to limited mobility.  Explained it is not that simple.  "Yes, it is.  Someone from my insurance company said all you have to do is sign."  Mobility is limited.  "I don't get around too good outside."  Inside, has trouble getting out of bed.  Can't make the be or cool,.  Does get to and from the bathroom using her walker.   6. As labs returned, she has AKI. On phone asked and states no OTC NSAIDs.  Drinking well. 7. Chronic schizophrenia makes hx hard.  At the end of the visit, patient mentioned that her daughter wants her tested for dementia (daughter did not come.)  I did not notice any change in the coherence of her history compared to previous visits.    Review of Systems     Objective:   Physical Exam Wt is mid range for where she has been fluctuating. Trace bilateral edema.   Has hypersensitivity of skin over ankle and leg.  No rash.        Assessment & Plan:

## 2016-01-02 NOTE — Telephone Encounter (Signed)
PA completed online at www.covermymeds.com and approved.  Approval [id number]. Derl Barrow, RN

## 2016-01-05 ENCOUNTER — Other Ambulatory Visit: Payer: Medicare Other

## 2016-01-05 DIAGNOSIS — L603 Nail dystrophy: Secondary | ICD-10-CM | POA: Diagnosis not present

## 2016-01-05 DIAGNOSIS — N179 Acute kidney failure, unspecified: Secondary | ICD-10-CM

## 2016-01-05 DIAGNOSIS — I739 Peripheral vascular disease, unspecified: Secondary | ICD-10-CM | POA: Diagnosis not present

## 2016-01-05 DIAGNOSIS — N189 Chronic kidney disease, unspecified: Principal | ICD-10-CM

## 2016-01-05 DIAGNOSIS — E1051 Type 1 diabetes mellitus with diabetic peripheral angiopathy without gangrene: Secondary | ICD-10-CM | POA: Diagnosis not present

## 2016-01-05 DIAGNOSIS — L84 Corns and callosities: Secondary | ICD-10-CM | POA: Diagnosis not present

## 2016-01-05 LAB — BASIC METABOLIC PANEL
BUN: 18 mg/dL (ref 7–25)
CO2: 23 mmol/L (ref 20–31)
Calcium: 9.1 mg/dL (ref 8.6–10.4)
Chloride: 106 mmol/L (ref 98–110)
Creat: 1.43 mg/dL — ABNORMAL HIGH (ref 0.60–0.93)
Glucose, Bld: 143 mg/dL — ABNORMAL HIGH (ref 65–99)
Potassium: 5.6 mmol/L — ABNORMAL HIGH (ref 3.5–5.3)
Sodium: 135 mmol/L (ref 135–146)

## 2016-01-12 DIAGNOSIS — J45909 Unspecified asthma, uncomplicated: Secondary | ICD-10-CM | POA: Diagnosis not present

## 2016-01-20 ENCOUNTER — Other Ambulatory Visit: Payer: Self-pay | Admitting: *Deleted

## 2016-01-20 DIAGNOSIS — I25118 Atherosclerotic heart disease of native coronary artery with other forms of angina pectoris: Secondary | ICD-10-CM

## 2016-01-20 MED ORDER — NITROGLYCERIN 0.4 MG SL SUBL: 0.4000 mg | SUBLINGUAL_TABLET | SUBLINGUAL | Status: DC | PRN

## 2016-01-21 ENCOUNTER — Ambulatory Visit (INDEPENDENT_AMBULATORY_CARE_PROVIDER_SITE_OTHER): Payer: Medicare Other | Admitting: Family Medicine

## 2016-01-21 ENCOUNTER — Encounter: Payer: Self-pay | Admitting: Family Medicine

## 2016-01-21 VITALS — BP 143/71 | HR 107 | Temp 98.7°F | Ht 63.0 in | Wt 278.8 lb

## 2016-01-21 DIAGNOSIS — I10 Essential (primary) hypertension: Secondary | ICD-10-CM | POA: Diagnosis not present

## 2016-01-21 DIAGNOSIS — N183 Chronic kidney disease, stage 3 unspecified: Secondary | ICD-10-CM

## 2016-01-21 DIAGNOSIS — I5032 Chronic diastolic (congestive) heart failure: Secondary | ICD-10-CM

## 2016-01-21 DIAGNOSIS — N179 Acute kidney failure, unspecified: Secondary | ICD-10-CM | POA: Diagnosis not present

## 2016-01-21 DIAGNOSIS — N189 Chronic kidney disease, unspecified: Secondary | ICD-10-CM

## 2016-01-21 LAB — BASIC METABOLIC PANEL
BUN: 14 mg/dL (ref 7–25)
CO2: 22 mmol/L (ref 20–31)
Calcium: 8.9 mg/dL (ref 8.6–10.4)
Chloride: 106 mmol/L (ref 98–110)
Creat: 1.3 mg/dL — ABNORMAL HIGH (ref 0.60–0.93)
Glucose, Bld: 291 mg/dL — ABNORMAL HIGH (ref 65–99)
Potassium: 4.6 mmol/L (ref 3.5–5.3)
Sodium: 139 mmol/L (ref 135–146)

## 2016-01-21 MED ORDER — METOPROLOL SUCCINATE ER 50 MG PO TB24: 50.0000 mg | ORAL_TABLET | Freq: Every day | ORAL | Status: DC

## 2016-01-21 NOTE — Patient Instructions (Signed)
Stop the lasix/furosemide Stop the lisinopril. I sent in a new prescription for metoprolol: a blood pressure/heart pill that I thought you were on all along. Thank you for bringing in all your medications.  That really helped. Good job on the weight loss.

## 2016-01-22 NOTE — Assessment & Plan Note (Signed)
Recheck BMP.  Improved.  Now off lasix and lisinopril

## 2016-01-22 NOTE — Assessment & Plan Note (Signed)
Heart failure not worsened off lasix and lisinopril.  Stable.  Restart metoprolol

## 2016-01-22 NOTE — Assessment & Plan Note (Signed)
Restart metoprolol

## 2016-01-22 NOTE — Assessment & Plan Note (Signed)
Repeat labs continued improvemnt

## 2016-01-22 NOTE — Progress Notes (Signed)
   Subjective:    Patient ID: Brittany Roberts, female    DOB: [birth year], 75 y.o.   MRN: [id number]  HPI 1. Mult issues as always with Brittany Roberts. 1. Recheck BP and fluid status off ACE and furosemide.  She feels much better.  I am impressed that she has a six pound weight loss despite being off lasix.  No chest pain or SOB.  DOE is stable.   2. Med confusion.  She did bring in all meds.  Med rec done and only big discrepancy is that she was not taking metoprolol.   3. Also hx of anemia and needs refill on her iron.   4. With her recent hand surgery, she is less able to care for herself.  At her request, I did provide a letter.     Review of Systems     Objective:   Physical Exam Cardiac RRR without m or or  Lungs clear Ext no edema.       Assessment & Plan:

## 2016-01-26 ENCOUNTER — Other Ambulatory Visit: Payer: Self-pay | Admitting: *Deleted

## 2016-01-26 DIAGNOSIS — M5441 Lumbago with sciatica, right side: Secondary | ICD-10-CM

## 2016-01-26 MED ORDER — TRAMADOL HCL 50 MG PO TABS: ORAL_TABLET | ORAL | 5 refills | Status: DC

## 2016-02-04 ENCOUNTER — Other Ambulatory Visit: Payer: Self-pay | Admitting: Family Medicine

## 2016-02-05 DIAGNOSIS — M1811 Unilateral primary osteoarthritis of first carpometacarpal joint, right hand: Secondary | ICD-10-CM | POA: Diagnosis not present

## 2016-02-10 ENCOUNTER — Encounter: Payer: Self-pay | Admitting: Family Medicine

## 2016-02-10 ENCOUNTER — Ambulatory Visit (INDEPENDENT_AMBULATORY_CARE_PROVIDER_SITE_OTHER): Payer: Medicare Other | Admitting: Family Medicine

## 2016-02-10 DIAGNOSIS — R600 Localized edema: Secondary | ICD-10-CM

## 2016-02-10 MED ORDER — FUROSEMIDE 20 MG PO TABS: 20.0000 mg | ORAL_TABLET | Freq: Every day | ORAL | 0 refills | Status: DC

## 2016-02-10 NOTE — Patient Instructions (Signed)
The swelling has come back in your legs.  I am going to restart the fluid pill for the next 2 weeks.  Take 40 mg for 3 days, then 20 mg daily after that.    Come back to see [place] in 2 weeks to make sure you're doing okay.  If you have any trouble breathing, come back sooner.

## 2016-02-10 NOTE — Progress Notes (Signed)
Subjective:    Brittany Roberts is a 75 y.o. female who presents to [facility] today for leg swelling:  1.  Leg swelling:  Started about 2 weeks ago. Her lisinopril and 40 mg Lasix were discontinued about 3 weeks ago at appointment with her PCP. She states since then she has gradually noticed worsening swelling in both of her legs. She now has some pain in her legs secondary to the swelling. No redness. No recent travel/plan trips. She has compression stockings at home but because of the weakness in her hands she is unable to pull these on.  No chest pain, no shortness of breath.    ROS as above per HPI.    The following portions of the patient's history were reviewed and updated as appropriate: allergies, current medications, past medical history, family and social history, and problem list. Patient is a nonsmoker.    PMH reviewed.     Medications reviewed.    Objective:   Physical Exam BP (!) 130/56   Pulse 77   Temp 98.3 F (36.8 C) (Oral)   Ht 5\' 3"  (1.6 m)   Wt 278 lb (126.1 kg)   SpO2 97%   BMI 49.25 kg/m  Gen:  Alert, cooperative patient who appears stated age in no acute distress.  Vital signs reviewed. HEENT: EOMI,  MMM Cardiac:  Regular rate and rhythm  Pulm:  Clear to auscultation bilaterally with good air movement.  No wheezes or rales noted.   Exts: +3 edema BL LE's to mid-shins.  No redness noted, no excessive warmth  No results found for this or any previous visit (from the past 72 hour(s)).

## 2016-02-10 NOTE — Assessment & Plan Note (Signed)
Weights have been stable. She feels that her close are much looser and so she does not completely believe the scale. Plan to restart 40 mg of Lasix the next 3 days then drop down to 20 mg daily after that. She states she has been on Lasix intermittently for years. Compression stockings would also greatly help and likely be better choice for treatment for what seems to be more venous insufficiency than heart failure exacerbation; however she states that pain in her hands prevents her from putting these on. Warning precautions for worsening CHF provided.  Follow-up with [place] in 2 weeks to make sure she is showing improvement.  CKD Stage III noted in problem list, want to prevent over-diuresis with lasix.

## 2016-02-12 DIAGNOSIS — J45909 Unspecified asthma, uncomplicated: Secondary | ICD-10-CM | POA: Diagnosis not present

## 2016-02-24 ENCOUNTER — Ambulatory Visit (INDEPENDENT_AMBULATORY_CARE_PROVIDER_SITE_OTHER): Payer: Medicare Other | Admitting: Family Medicine

## 2016-02-24 ENCOUNTER — Encounter: Payer: Self-pay | Admitting: Family Medicine

## 2016-02-24 DIAGNOSIS — Z23 Encounter for immunization: Secondary | ICD-10-CM | POA: Diagnosis not present

## 2016-02-24 DIAGNOSIS — R600 Localized edema: Secondary | ICD-10-CM

## 2016-02-24 DIAGNOSIS — I5032 Chronic diastolic (congestive) heart failure: Secondary | ICD-10-CM | POA: Diagnosis not present

## 2016-02-24 NOTE — Assessment & Plan Note (Signed)
Much improved on the lasix.  She is unable to tolerate LE compression stockings. She may need a few doses per week, or base it on her weight gain. Came up with plan to use lasix when she notes her weight has gone up.  See instructions.  Plan FU PCP in 3-4 weeks.

## 2016-02-24 NOTE — Assessment & Plan Note (Signed)
See LE edema assessment.  Heart failure action plan provided today based on her weights.  She was able to verbalize this back to me.  FU with PCP in 3-4 weeks to reassess.

## 2016-02-24 NOTE — Addendum Note (Signed)
Addended by: Londell Moh T on: 02/24/2016 05:28 PM   Modules accepted: Orders, SmartSet

## 2016-02-24 NOTE — Patient Instructions (Signed)
Take the lasix 20 mg if you notice your weight has gone up 2 pounds 2 days in a row.  If your weight is the same or less, don't take it.  It was very good to see you today!

## 2016-02-24 NOTE — Progress Notes (Signed)
  Subjective:    Brittany Roberts is a 75 y.o. female who presents to [facility] today for FU for LE edema:  1.  LE edema:  Patient seen here 2 weeks ago for increased bilateral lower extremity edema. She had been taken off her Lasix prior to that. She does have a diagnosis of heart failure. She had questionable increased cough at that last visit.    Since last visit, she had 3 days worth of 40 mg of Lasix followed by 20 mg of Lasix for the past 2 weeks.  She states that her swelling is much improved. She states she has lost some weight. She also reports that her close feel looser. No further cough. No chest pain or palpitations.  She otherwise feels well.   ROS as above.   PMH;   Patient is a nonsmoker.     Objective:   Physical Exam BP 109/85   Pulse 86   Temp 99 F (37.2 C) (Oral)   Ht 5\' 3"  (1.6 m)   Wt 275 lb 6.4 oz (124.9 kg)   BMI 48.78 kg/m  Gen:  Alert, cooperative patient who appears stated age in no acute distress.  Vital signs reviewed. Cardiac:  Regular rate and rhythm  Pulm:  Clear to auscultation bilaterally with good air movement.  No wheezes or rales noted.   Ext:  Trace edema BL ankles.  No redness/tenderness.

## 2016-03-05 DIAGNOSIS — I428 Other cardiomyopathies: Secondary | ICD-10-CM | POA: Diagnosis not present

## 2016-03-05 DIAGNOSIS — E119 Type 2 diabetes mellitus without complications: Secondary | ICD-10-CM | POA: Diagnosis not present

## 2016-03-05 DIAGNOSIS — Z5309 Procedure and treatment not carried out because of other contraindication: Secondary | ICD-10-CM | POA: Diagnosis not present

## 2016-03-05 DIAGNOSIS — Z9861 Coronary angioplasty status: Secondary | ICD-10-CM | POA: Diagnosis not present

## 2016-03-05 DIAGNOSIS — E7800 Pure hypercholesterolemia, unspecified: Secondary | ICD-10-CM | POA: Diagnosis not present

## 2016-03-05 DIAGNOSIS — I10 Essential (primary) hypertension: Secondary | ICD-10-CM | POA: Diagnosis not present

## 2016-03-05 DIAGNOSIS — I2510 Atherosclerotic heart disease of native coronary artery without angina pectoris: Secondary | ICD-10-CM | POA: Diagnosis not present

## 2016-03-14 DIAGNOSIS — J45909 Unspecified asthma, uncomplicated: Secondary | ICD-10-CM | POA: Diagnosis not present

## 2016-03-17 ENCOUNTER — Encounter: Payer: Self-pay | Admitting: Family Medicine

## 2016-03-17 ENCOUNTER — Ambulatory Visit (INDEPENDENT_AMBULATORY_CARE_PROVIDER_SITE_OTHER): Payer: Medicare Other | Admitting: Family Medicine

## 2016-03-17 VITALS — BP 138/53 | HR 64 | Temp 98.1°F | Ht 63.0 in | Wt 271.4 lb

## 2016-03-17 DIAGNOSIS — I5032 Chronic diastolic (congestive) heart failure: Secondary | ICD-10-CM

## 2016-03-17 DIAGNOSIS — E1149 Type 2 diabetes mellitus with other diabetic neurological complication: Secondary | ICD-10-CM

## 2016-03-17 LAB — POCT GLYCOSYLATED HEMOGLOBIN (HGB A1C): Hemoglobin A1C: 8.6

## 2016-03-17 NOTE — Patient Instructions (Addendum)
Let me know if Dr. Einar Gip wants me to do the follow up thyroid blood test. You are overdue for an eye exam.  Please have the eye doctor send me a copy of the exam.   Keep up the great work on your weight loss. You need to get back on the insulin every day.  Your A1C jumped up a lot over the last three months.  You do need the insulin. See me in three months to recheck the diabetes. Make sure you follow up with Dr. Einar Gip.

## 2016-03-18 NOTE — Assessment & Plan Note (Signed)
Poor control.  Get back on insulin regularly.

## 2016-03-18 NOTE — Assessment & Plan Note (Signed)
Await repeat echo.  Seems euvolemic at present.

## 2016-03-18 NOTE — Assessment & Plan Note (Signed)
Some improvement in wt.  Congratulated.

## 2016-03-18 NOTE — Progress Notes (Signed)
   Subjective:    Patient ID: Brittany Roberts, female    DOB: May 31, 1941, 75 y.o.   MRN: [id number]  HPI Passion tells me she feels good and generally looks happier than I have seen in a while.  Issues. 1.Seeing Cardiology, Dr. Einar Gip who has ordered an echo due to concerns of CHF.  He also did blood work and mentioned something about an abnormal thyroid blood test.  Ms Maione things Dr. Einar Gip plans to follow up on this. 2. DM states eating well and trying to be more active.  Only when I got back a worsened A1C did she admit to skipping her insulin on many days.  She was trying to see if she could stop her insulin.   3. Overdue for diabetic eye exam.   4. Nice, slow wt loss for her morbid obesity.     Review of Systems Denies chest pain or DOE.  Feels more steady on her feet.     Objective:   Physical Exam VS noted including good wt loss.   Lungs clear Cardiac RRR without m or g Abd benign Ext trace edema.        Assessment & Plan:

## 2016-03-23 DIAGNOSIS — I10 Essential (primary) hypertension: Secondary | ICD-10-CM | POA: Diagnosis not present

## 2016-03-23 DIAGNOSIS — I428 Other cardiomyopathies: Secondary | ICD-10-CM | POA: Diagnosis not present

## 2016-03-25 ENCOUNTER — Encounter: Payer: Self-pay | Admitting: Family Medicine

## 2016-03-25 DIAGNOSIS — I5042 Chronic combined systolic (congestive) and diastolic (congestive) heart failure: Secondary | ICD-10-CM

## 2016-03-29 DIAGNOSIS — I10 Essential (primary) hypertension: Secondary | ICD-10-CM | POA: Diagnosis not present

## 2016-03-29 DIAGNOSIS — I2510 Atherosclerotic heart disease of native coronary artery without angina pectoris: Secondary | ICD-10-CM | POA: Diagnosis not present

## 2016-03-29 DIAGNOSIS — I5042 Chronic combined systolic (congestive) and diastolic (congestive) heart failure: Secondary | ICD-10-CM | POA: Diagnosis not present

## 2016-03-29 DIAGNOSIS — Z9861 Coronary angioplasty status: Secondary | ICD-10-CM | POA: Diagnosis not present

## 2016-03-31 ENCOUNTER — Other Ambulatory Visit: Payer: Self-pay | Admitting: Neurology

## 2016-03-31 ENCOUNTER — Other Ambulatory Visit: Payer: Self-pay | Admitting: *Deleted

## 2016-03-31 DIAGNOSIS — I10 Essential (primary) hypertension: Secondary | ICD-10-CM

## 2016-03-31 MED ORDER — AMLODIPINE BESYLATE 10 MG PO TABS: 10.0000 mg | ORAL_TABLET | Freq: Every day | ORAL | 3 refills | Status: DC

## 2016-03-31 NOTE — Telephone Encounter (Signed)
[facility] called stating they are the patient's new pharmacy and requesting a refill on Norvasc 10 mg.  Derl Barrow, RN

## 2016-04-01 ENCOUNTER — Ambulatory Visit: Payer: Self-pay

## 2016-04-01 ENCOUNTER — Encounter: Payer: Self-pay | Admitting: *Deleted

## 2016-04-01 ENCOUNTER — Ambulatory Visit (INDEPENDENT_AMBULATORY_CARE_PROVIDER_SITE_OTHER): Payer: Medicare Other | Admitting: *Deleted

## 2016-04-01 VITALS — BP 130/70 | HR 58 | Temp 98.2°F | Ht 63.0 in | Wt 268.2 lb

## 2016-04-01 DIAGNOSIS — Z0000 Encounter for general adult medical examination without abnormal findings: Secondary | ICD-10-CM | POA: Diagnosis not present

## 2016-04-01 NOTE — Patient Instructions (Signed)
Diabetes and Foot Care Diabetes may cause you to have problems because of poor blood supply (circulation) to your feet and legs. This may cause the skin on your feet to become thinner, break easier, and heal more slowly. Your skin may become dry, and the skin may peel and crack. You may also have nerve damage in your legs and feet causing decreased feeling in them. You may not notice minor injuries to your feet that could lead to infections or more serious problems. Taking care of your feet is one of the most important things you can do for yourself.  HOME CARE INSTRUCTIONS  Wear shoes at all times, even in the house. Do not go barefoot. Bare feet are easily injured.  Check your feet daily for blisters, cuts, and redness. If you cannot see the bottom of your feet, use a mirror or ask someone for help.  Wash your feet with warm water (do not use hot water) and mild soap. Then pat your feet and the areas between your toes until they are completely dry. Do not soak your feet as this can dry your skin.  Apply a moisturizing lotion or petroleum jelly (that does not contain alcohol and is unscented) to the skin on your feet and to dry, brittle toenails. Do not apply lotion between your toes.  Trim your toenails straight across. Do not dig under them or around the cuticle. File the edges of your nails with an emery board or nail file.  Do not cut corns or calluses or try to remove them with medicine.  Wear clean socks or stockings every day. Make sure they are not too tight. Do not wear knee-high stockings since they may decrease blood flow to your legs.  Wear shoes that fit properly and have enough cushioning. To break in new shoes, wear them for just a few hours a day. This prevents you from injuring your feet. Always look in your shoes before you put them on to be sure there are no objects inside.  Do not cross your legs. This may decrease the blood flow to your feet.  If you find a minor scrape,  cut, or break in the skin on your feet, keep it and the skin around it clean and dry. These areas may be cleansed with mild soap and water. Do not cleanse the area with peroxide, alcohol, or iodine.  When you remove an adhesive bandage, be sure not to damage the skin around it.  If you have a wound, look at it several times a day to make sure it is healing.  Do not use heating pads or hot water bottles. They may burn your skin. If you have lost feeling in your feet or legs, you may not know it is happening until it is too late.  Make sure your health care provider performs a complete foot exam at least annually or more often if you have foot problems. Report any cuts, sores, or bruises to your health care provider immediately. SEEK MEDICAL CARE IF:   You have an injury that is not healing.  You have cuts or breaks in the skin.  You have an ingrown nail.  You notice redness on your legs or feet.  You feel burning or tingling in your legs or feet.  You have pain or cramps in your legs and feet.  Your legs or feet are numb.  Your feet always feel cold. SEEK IMMEDIATE MEDICAL CARE IF:   There is increasing redness,  swelling, or pain in or around a wound.  There is a red line that goes up your leg.  Pus is coming from a wound.  You develop a fever or as directed by your health care provider.  You notice a bad smell coming from an ulcer or wound.   This information is not intended to replace advice given to you by your health care provider. Make sure you discuss any questions you have with your health care provider.   Document Released: 06/18/2000 Document Revised: 02/21/2013 Document Reviewed: 11/28/2012 Elsevier Interactive Patient Education 2016 [place] in the Home  Falls can cause injuries. They can happen to people of all ages. There are many things you can do to make your home safe and to help prevent falls.  WHAT CAN I DO ON THE OUTSIDE OF MY  HOME?  Regularly fix the edges of walkways and driveways and fix any cracks.  Remove anything that might make you trip as you walk through a door, such as a raised step or threshold.  Trim any bushes or trees on the path to your home.  Use bright outdoor lighting.  Clear any walking paths of anything that might make someone trip, such as rocks or tools.  Regularly check to see if handrails are loose or broken. Make sure that both sides of any steps have handrails.  Any raised decks and porches should have guardrails on the edges.  Have any leaves, snow, or ice cleared regularly.  Use sand or salt on walking paths during winter.  Clean up any spills in your garage right away. This includes oil or grease spills. WHAT CAN I DO IN THE BATHROOM?   Use night lights.  Install grab bars by the toilet and in the tub and shower. Do not use towel bars as grab bars.  Use non-skid mats or decals in the tub or shower.  If you need to sit down in the shower, use a plastic, non-slip stool.  Keep the floor dry. Clean up any water that spills on the floor as soon as it happens.  Remove soap buildup in the tub or shower regularly.  Attach bath mats securely with double-sided non-slip rug tape.  Do not have throw rugs and other things on the floor that can make you trip. WHAT CAN I DO IN THE BEDROOM?  Use night lights.  Make sure that you have a light by your bed that is easy to reach.  Do not use any sheets or blankets that are too big for your bed. They should not hang down onto the floor.  Have a firm chair that has side arms. You can use this for support while you get dressed.  Do not have throw rugs and other things on the floor that can make you trip. WHAT CAN I DO IN THE KITCHEN?  Clean up any spills right away.  Avoid walking on wet floors.  Keep items that you use a lot in easy-to-reach places.  If you need to reach something above you, use a strong step stool that has a  grab bar.  Keep electrical cords out of the way.  Do not use floor polish or wax that makes floors slippery. If you must use wax, use non-skid floor wax.  Do not have throw rugs and other things on the floor that can make you trip. WHAT CAN I DO WITH MY STAIRS?  Do not leave any items on the stairs.  Make sure that there are handrails on both sides of the stairs and use them. Fix handrails that are broken or loose. Make sure that handrails are as long as the stairways.  Check any carpeting to make sure that it is firmly attached to the stairs. Fix any carpet that is loose or worn.  Avoid having throw rugs at the top or bottom of the stairs. If you do have throw rugs, attach them to the floor with carpet tape.  Make sure that you have a light switch at the top of the stairs and the bottom of the stairs. If you do not have them, ask someone to add them for you. WHAT ELSE CAN I DO TO HELP PREVENT FALLS?  Wear shoes that:  Do not have high heels.  Have rubber bottoms.  Are comfortable and fit you well.  Are closed at the toe. Do not wear sandals.  If you use a stepladder:  Make sure that it is fully opened. Do not climb a closed stepladder.  Make sure that both sides of the stepladder are locked into place.  Ask someone to hold it for you, if possible.  Clearly mark and make sure that you can see:  Any grab bars or handrails.  First and last steps.  Where the edge of each step is.  Use tools that help you move around (mobility aids) if they are needed. These include:  Canes.  Walkers.  Scooters.  Crutches.  Turn on the lights when you go into a dark area. Replace any light bulbs as soon as they burn out.  Set up your furniture so you have a clear path. Avoid moving your furniture around.  If any of your floors are uneven, fix them.  If there are any pets around you, be aware of where they are.  Review your medicines with your doctor. Some medicines can make  you feel dizzy. This can increase your chance of falling. Ask your doctor what other things that you can do to help prevent falls.   This information is not intended to replace advice given to you by your health care provider. Make sure you discuss any questions you have with your health care provider.   Document Released: 04/17/2009 Document Revised: 11/05/2014 Document Reviewed: 07/26/2014 Elsevier Interactive Patient Education 2016 [place] Maintenance, Female Adopting a healthy lifestyle and getting preventive care can go a long way to promote health and wellness. Talk with your health care provider about what schedule of regular examinations is right for you. This is a good chance for you to check in with your provider about disease prevention and staying healthy. In between checkups, there are plenty of things you can do on your own. Experts have done a lot of research about which lifestyle changes and preventive measures are most likely to keep you healthy. Ask your health care provider for more information. WEIGHT AND DIET  Eat a healthy diet  Be sure to include plenty of vegetables, fruits, low-fat dairy products, and lean protein.  Do not eat a lot of foods high in solid fats, added sugars, or salt.  Get regular exercise. This is one of the most important things you can do for your health.  Most adults should exercise for at least 150 minutes each week. The exercise should increase your heart rate and make you sweat (moderate-intensity exercise).  Most adults should also do strengthening exercises at least twice a week. This is in addition to the  moderate-intensity exercise.  Maintain a healthy weight  Body mass index (BMI) is a measurement that can be used to identify possible weight problems. It estimates body fat based on height and weight. Your health care provider can help determine your BMI and help you achieve or maintain a healthy weight.  For females 68  years of age and older:   A BMI below 18.5 is considered underweight.  A BMI of 18.5 to 24.9 is normal.  A BMI of 25 to 29.9 is considered overweight.  A BMI of 30 and above is considered obese.  Watch levels of cholesterol and blood lipids  You should start having your blood tested for lipids and cholesterol at 76 years of age, then have this test every 5 years.  You may need to have your cholesterol levels checked more often if:  Your lipid or cholesterol levels are high.  You are older than 76 years of age.  You are at high risk for heart disease.  CANCER SCREENING   Lung Cancer  Lung cancer screening is recommended for adults 43-29 years old who are at high risk for lung cancer because of a history of smoking.  A yearly low-dose CT scan of the lungs is recommended for people who:  Currently smoke.  Have quit within the past 15 years.  Have at least a 30-pack-year history of smoking. A pack year is smoking an average of one pack of cigarettes a day for 1 year.  Yearly screening should continue until it has been 15 years since you quit.  Yearly screening should stop if you develop a health problem that would prevent you from having lung cancer treatment.  Breast Cancer  Practice breast self-awareness. This means understanding how your breasts normally appear and feel.  It also means doing regular breast self-exams. Let your health care provider know about any changes, no matter how small.  If you are in your 20s or 30s, you should have a clinical breast exam (CBE) by a health care provider every 1-3 years as part of a regular health exam.  If you are 61 or older, have a CBE every year. Also consider having a breast X-ray (mammogram) every year.  If you have a family history of breast cancer, talk to your health care provider about genetic screening.  If you are at high risk for breast cancer, talk to your health care provider about having an MRI and a mammogram  every year.  Breast cancer gene (BRCA) assessment is recommended for women who have family members with BRCA-related cancers. BRCA-related cancers include:  Breast.  Ovarian.  Tubal.  Peritoneal cancers.  Results of the assessment will determine the need for genetic counseling and BRCA1 and BRCA2 testing. Cervical Cancer Your health care provider may recommend that you be screened regularly for cancer of the pelvic organs (ovaries, uterus, and vagina). This screening involves a pelvic examination, including checking for microscopic changes to the surface of your cervix (Pap test). You may be encouraged to have this screening done every 3 years, beginning at age 48.  For women ages 5-65, health care providers may recommend pelvic exams and Pap testing every 3 years, or they may recommend the Pap and pelvic exam, combined with testing for human papilloma virus (HPV), every 5 years. Some types of HPV increase your risk of cervical cancer. Testing for HPV may also be done on women of any age with unclear Pap test results.  Other health care providers may not recommend  any screening for nonpregnant women who are considered low risk for pelvic cancer and who do not have symptoms. Ask your health care provider if a screening pelvic exam is right for you.  If you have had past treatment for cervical cancer or a condition that could lead to cancer, you need Pap tests and screening for cancer for at least 20 years after your treatment. If Pap tests have been discontinued, your risk factors (such as having a new sexual partner) need to be reassessed to determine if screening should resume. Some women have medical problems that increase the chance of getting cervical cancer. In these cases, your health care provider may recommend more frequent screening and Pap tests. Colorectal Cancer  This type of cancer can be detected and often prevented.  Routine colorectal cancer screening usually begins at 76  years of age and continues through 76 years of age.  Your health care provider may recommend screening at an earlier age if you have risk factors for colon cancer.  Your health care provider may also recommend using home test kits to check for hidden blood in the stool.  A small camera at the end of a tube can be used to examine your colon directly (sigmoidoscopy or colonoscopy). This is done to check for the earliest forms of colorectal cancer.  Routine screening usually begins at age 20.  Direct examination of the colon should be repeated every 5-10 years through 76 years of age. However, you may need to be screened more often if early forms of precancerous polyps or small growths are found. Skin Cancer  Check your skin from head to toe regularly.  Tell your health care provider about any new moles or changes in moles, especially if there is a change in a mole's shape or color.  Also tell your health care provider if you have a mole that is larger than the size of a pencil eraser.  Always use sunscreen. Apply sunscreen liberally and repeatedly throughout the day.  Protect yourself by wearing long sleeves, pants, a wide-brimmed hat, and sunglasses whenever you are outside. HEART DISEASE, DIABETES, AND HIGH BLOOD PRESSURE   High blood pressure causes heart disease and increases the risk of stroke. High blood pressure is more likely to develop in:  People who have blood pressure in the high end of the normal range (130-139/85-89 mm Hg).  People who are overweight or obese.  People who are African American.  If you are 45-64 years of age, have your blood pressure checked every 3-5 years. If you are 75 years of age or older, have your blood pressure checked every year. You should have your blood pressure measured twice--once when you are at a hospital or clinic, and once when you are not at a hospital or clinic. Record the average of the two measurements. To check your blood pressure  when you are not at a hospital or clinic, you can use:  An automated blood pressure machine at a pharmacy.  A home blood pressure monitor.  If you are between 51 years and 35 years old, ask your health care provider if you should take aspirin to prevent strokes.  Have regular diabetes screenings. This involves taking a blood sample to check your fasting blood sugar level.  If you are at a normal weight and have a low risk for diabetes, have this test once every three years after 76 years of age.  If you are overweight and have a high risk for diabetes,  consider being tested at a younger age or more often. PREVENTING INFECTION  Hepatitis B  If you have a higher risk for hepatitis B, you should be screened for this virus. You are considered at high risk for hepatitis B if:  You were born in a country where hepatitis B is common. Ask your health care provider which countries are considered high risk.  Your parents were born in a high-risk country, and you have not been immunized against hepatitis B (hepatitis B vaccine).  You have HIV or AIDS.  You use needles to inject street drugs.  You live with someone who has hepatitis B.  You have had sex with someone who has hepatitis B.  You get hemodialysis treatment.  You take certain medicines for conditions, including cancer, organ transplantation, and autoimmune conditions. Hepatitis C  Blood testing is recommended for:  Everyone born from [birth year] through 1966.  Anyone with known risk factors for hepatitis C. Sexually transmitted infections (STIs)  You should be screened for sexually transmitted infections (STIs) including gonorrhea and chlamydia if:  You are sexually active and are younger than 76 years of age.  You are older than 76 years of age and your health care provider tells you that you are at risk for this type of infection.  Your sexual activity has changed since you were last screened and you are at an increased risk  for chlamydia or gonorrhea. Ask your health care provider if you are at risk.  If you do not have HIV, but are at risk, it may be recommended that you take a prescription medicine daily to prevent HIV infection. This is called pre-exposure prophylaxis (PrEP). You are considered at risk if:  You are sexually active and do not regularly use condoms or know the HIV status of your partner(s).  You take drugs by injection.  You are sexually active with a partner who has HIV. Talk with your health care provider about whether you are at high risk of being infected with HIV. If you choose to begin PrEP, you should first be tested for HIV. You should then be tested every 3 months for as long as you are taking PrEP.  PREGNANCY   If you are premenopausal and you may become pregnant, ask your health care provider about preconception counseling.  If you may become pregnant, take 400 to 800 micrograms (mcg) of folic acid every day.  If you want to prevent pregnancy, talk to your health care provider about birth control (contraception). OSTEOPOROSIS AND MENOPAUSE   Osteoporosis is a disease in which the bones lose minerals and strength with aging. This can result in serious bone fractures. Your risk for osteoporosis can be identified using a bone density scan.  If you are 71 years of age or older, or if you are at risk for osteoporosis and fractures, ask your health care provider if you should be screened.  Ask your health care provider whether you should take a calcium or vitamin D supplement to lower your risk for osteoporosis.  Menopause may have certain physical symptoms and risks.  Hormone replacement therapy may reduce some of these symptoms and risks. Talk to your health care provider about whether hormone replacement therapy is right for you.  HOME CARE INSTRUCTIONS   Schedule regular health, dental, and eye exams.  Stay current with your immunizations.   Do not use any tobacco products  including cigarettes, chewing tobacco, or electronic cigarettes.  If you are pregnant, do not drink alcohol.  If you are breastfeeding, limit how much and how often you drink alcohol.  Limit alcohol intake to no more than 1 drink per day for nonpregnant women. One drink equals 12 ounces of beer, 5 ounces of wine, or 1 ounces of hard liquor.  Do not use street drugs.  Do not share needles.  Ask your health care provider for help if you need support or information about quitting drugs.  Tell your health care provider if you often feel depressed.  Tell your health care provider if you have ever been abused or do not feel safe at home.   This information is not intended to replace advice given to you by your health care provider. Make sure you discuss any questions you have with your health care provider.   Document Released: 01/04/2011 Document Revised: 07/12/2014 Document Reviewed: 05/23/2013 Elsevier Interactive Patient Education [employer].

## 2016-04-01 NOTE — Progress Notes (Signed)
Subjective:   Brittany Roberts is a 75 y.o. female who presents for an Initial Medicare Annual Wellness Visit.   Cardiac Risk Factors include: advanced age (>22men, >71 women);diabetes mellitus;dyslipidemia;hypertension;obesity (BMI >30kg/m2);sedentary lifestyle;smoking/ tobacco exposure     Objective:    Today's Vitals   04/01/16 1343 04/01/16 1345  BP:  130/70  Pulse:  (!) 58  Temp:  98.2 F (36.8 C)  TempSrc:  Oral  SpO2:  96%  Weight: 268 lb 3.2 oz (121.7 kg)   Height: 5\' 3"  (1.6 m)   PainSc:  7    Body mass index is 47.51 kg/m.   Current Medications (verified) Outpatient Encounter Prescriptions as of 04/01/2016  Medication Sig  . amLODipine (NORVASC) 10 MG tablet Take 1 tablet (10 mg total) by mouth daily.  Marland Kitchen aspirin EC 81 MG tablet Take 162 mg by mouth daily.   Marland Kitchen atorvastatin (LIPITOR) 20 MG tablet Take 1 tablet (20 mg total) by mouth daily.  . DULoxetine (CYMBALTA) 60 MG capsule Take 1 capsule (60 mg total) by mouth daily.  Marland Kitchen FLOVENT HFA 220 MCG/ACT inhaler inhale 1 puff by mouth twice a day  . furosemide (LASIX) 20 MG tablet Take 1 tablet (20 mg total) by mouth daily.  Marland Kitchen gabapentin (NEURONTIN) 300 MG capsule Take 1 capsule (300 mg total) by mouth 2 (two) times daily.  Marland Kitchen HUMULIN N 100 UNIT/ML injection inject 0.3 milliliters (30 UNITS) subcutaneously every morning  . levothyroxine (SYNTHROID) 50 MCG tablet Take 1 tablet (50 mcg total) by mouth daily.  . metoprolol succinate (TOPROL-XL) 50 MG 24 hr tablet Take 1 tablet (50 mg total) by mouth daily. Take with or immediately following a meal.  . mometasone (ELOCON) 0.1 % ointment Apply topically daily.  . Multiple Vitamin (MULTIVITAMIN WITH MINERALS) TABS tablet Take 1 tablet by mouth daily.  . nitroGLYCERIN (NITROSTAT) 0.4 MG SL tablet Place 1 tablet (0.4 mg total) under the tongue every 5 (five) minutes as needed for chest pain.  . pantoprazole (PROTONIX) 40 MG tablet Take 1 tablet (40 mg total) by mouth 2 (two) times  daily.  Marland Kitchen PROAIR HFA 108 (90 BASE) MCG/ACT inhaler inhale 2 puffs by mouth every 4 hours if needed USE ONLY IF YOU ARE WHEEZING  . psyllium (METAMUCIL SMOOTH TEXTURE) 28 % packet Take 1 packet by mouth 2 (two) times daily.  . traMADol (ULTRAM) 50 MG tablet One or two tabs by mouth three times a day.  No more than six pills per day  . traZODone (DESYREL) 50 MG tablet Take 1 tablet (50 mg total) by mouth at bedtime.  . Ascorbic Acid (VITAMIN C PO) Take 1 tablet by mouth daily.  . B Complex-Biotin-FA (B COMPLETE) TABS Take 1 tablet by mouth daily.  . Cholecalciferol (VITAMIN D3) 5000 UNITS CAPS Take 5,000 Units by mouth daily.  . diclofenac sodium (VOLTAREN) 1 % GEL Apply 2 g topically 4 (four) times daily as needed (pain). (Patient not taking: Reported on 04/01/2016)  . isosorbide mononitrate (IMDUR) 120 MG 24 hr tablet Take 120 mg by mouth daily.  . vitamin A 10000 UNIT capsule Take 10,000 Units by mouth daily.   No facility-administered encounter medications on file as of 04/01/2016.     Allergies (verified) Ace inhibitors; Buspar [buspirone]; Pregabalin; Ropinirole hydrochloride; Amantadines; and Penicillins   History: Past Medical History:  Diagnosis Date  . Anxiety   . Arthritis    "all over"  . Asthma   . Bipolar disorder ([place])   .  Complication of anesthesia    "w/right foot OR they gave me too much and they couldn't get me woke"  . Congestive heart failure, unspecified   . Coronary artery disease    "I've got 1 stent" (06/30/2015)  . Depression   . Discoid lupus   . Fibromyalgia    "I've been told I have this; can't take Lyrica cause I'm allergic to it" (06/30/2015)  . GERD (gastroesophageal reflux disease)   . History of gout   . History of hiatal hernia    "real bad" (06/30/2015)  . Hypertension   . Hypothyroidism   . On home oxygen therapy    "2L q hs" (06/30/2015)  . OSA (obstructive sleep apnea)    "just use my oxygen; no mask" (06/30/2015)  . Other and  unspecified hyperlipidemia   . Penetrating foot wound    left nonhealing foot wound on the dorsal surface  . Peripheral neuropathy ([place])   . Pneumonia "many of times"  . Refusal of blood transfusions as patient is Jehovah's Witness   . Renal failure, unspecified    "kidneys not working 100%" (06/30/2015)  . Sickle cell trait ([place])   . Systemic lupus ([place])   . Type II diabetes mellitus ([place])   . Unspecified vitamin D deficiency    Past Surgical History:  Procedure Laterality Date  . ANKLE ARTHROSCOPY WITH OPEN REDUCTION INTERNAL FIXATION (ORIF) Right 03/2011  . CARDIAC CATHETERIZATION N/A 07/01/2015   Procedure: Left Heart Cath and Coronary Angiography;  Surgeon: Adrian Prows, MD;  Location: [facility];  Service: Cardiovascular;  Laterality: N/A;  . CARDIAC CATHETERIZATION N/A 07/01/2015   Procedure: Intravascular Pressure Wire/FFR Study;  Surgeon: Adrian Prows, MD;  Location: [facility];  Service: Cardiovascular;  Laterality: N/A;  . CARPAL TUNNEL RELEASE Bilateral   . CATARACT EXTRACTION W/ INTRAOCULAR LENS  IMPLANT, BILATERAL Bilateral   . CHOLECYSTECTOMY OPEN    . COLONOSCOPY N/A 12/10/2013   Procedure: COLONOSCOPY;  Surgeon: Inda Castle, MD;  Location: [facility];  Service: Endoscopy;  Laterality: N/A;  . FRACTURE SURGERY    . INCISION AND DRAINAGE ABSCESS Left    foot "under my little toe"  . KNEE LIGAMENT RECONSTRUCTION Right   . LEFT HEART CATHETERIZATION WITH CORONARY ANGIOGRAM N/A 08/08/2012   Procedure: LEFT HEART CATHETERIZATION WITH CORONARY ANGIOGRAM;  Surgeon: [name], MD;  Location: [facility] CATH LAB;  Service: Cardiovascular;  Laterality: N/A;  . NASAL SINUS SURGERY    . PERCUTANEOUS CORONARY STENT INTERVENTION (PCI-S) N/A 08/21/2012   Procedure: PERCUTANEOUS CORONARY STENT INTERVENTION (PCI-S);  Surgeon: [name], MD;  Location: [facility] CATH LAB;  Service: Cardiovascular;  Laterality: N/A;  . TUBAL LIGATION     Family History  Problem Relation  Age of Onset  . Heart disease Mother   . Diabetes Mother   . Clotting disorder Mother   . Pneumonia Father   . Rheum arthritis Father   . Diabetes Sister   . Diabetes Sister   . Asthma Brother   . Cancer Brother   . Kidney disease Brother   . Lupus Son   . Heart disease Son   . Heart disease Daughter   . Colon cancer Neg Hx    Social History   Occupational History  . Retired- Sport and exercise psychologist Retired   Social History Main Topics  . Smoking status: Former Smoker    Packs/day: 3.00    Years: 40.00    Types: Cigarettes    Start date:  07/05/1960    Quit date: 07/05/1998  . Smokeless tobacco: Never Used  . Alcohol use 0.0 oz/week     Comment: 06/30/2015 "used to drink a little alcohol on the weekends; not q weekend; nothing in years"  . Drug use: No  . Sexual activity: No    Tobacco Counseling Counseling given: Yes Patient is former smoker with no plans to restart   Activities of Daily Living In your present state of health, do you have any difficulty performing the following activities: 04/01/2016 06/30/2015  Hearing? Brittany Roberts? Y -  Difficulty concentrating or making decisions? Y -  Walking or climbing stairs? Y -  Dressing or bathing? Y -  Doing errands, shopping? N Y  Conservation officer, nature and eating ? N -  Using the Toilet? Y -  In the past six months, have you accidently leaked urine? Y -  Do you have problems with loss of bowel control? N -  Managing your Medications? N -  Managing your Finances? N -  Housekeeping or managing your Housekeeping? Y -  Some recent data might be hidden   Home Safety:  My home has a working smoke alarm:  Yes X 2           My home throw rugs have been fastened down to the floor or removed:  Has one at entrance that she will fasten down. I have non-slip mats in the bathtub and shower:  Yes         All my home's stairs have railings or bannisters: One level home with no outside stairs.         My home's floors, stairs and hallways are free  from clutter, wires and cords:  Yes       Immunizations and Health Maintenance Immunization History  Administered Date(s) Administered  . Influenza Split 04/03/2012, 02/24/2016  . Influenza Whole 05/07/2009, 05/08/2010, 03/15/2011  . Influenza,inj,Quad PF,36+ Mos 04/25/2013, 04/05/2014, 06/05/2015  . Pneumococcal Conjugate-13 08/10/2013  . Pneumococcal Polysaccharide-23 08/05/2000, 02/26/2009, 02/24/2016  . Td 08/05/2005   Health Maintenance Due  Topic Date Due  . OPHTHALMOLOGY EXAM  10/31/2012  . TETANUS/TDAP  08/06/2015  Patient will schedule appt for eye exam. List of doctors who take Medicare given to patient. Patient will obtain TDaP and Zostavax at local pharmacy  Patient Care Team: Zenia Resides, MD as PCP - General (Family Medicine) Inocencio Homes, DPM as Consulting Physician (Podiatry) Adrian Prows, MD as Consulting Physician (Cardiology) Inda Castle, MD as Consulting Physician (Gastroenterology)  Indicate any recent Medical Services you may have received from other than [facility] providers in the past year (date may be approximate).     Assessment:   This is a routine wellness examination for Brittany Roberts.   Hearing/Vision screen  Hearing Screening   Method: Audiometry   125Hz  250Hz  500Hz  1000Hz  2000Hz  3000Hz  4000Hz  6000Hz  8000Hz   Right ear:   Fail 40 Fail  40    Left ear:   Fail Fail Fail  Fail    Comments: Has misplaced hearing aids   Dietary issues and exercise activities discussed: Current Exercise Habits: Home exercise routine, Time (Minutes): 20, Frequency (Times/Week): 3, Weekly Exercise (Minutes/Week): 60, Intensity: Moderate, Exercise limited by: neurologic condition(s);orthopedic condition(s)  Goals    . Do chair exercises during the Price is Right every day    . Eat 4-5 vegetables a day    . HEMOGLOBIN A1C < 8    . Weight (lb) < 249 lb (112.9 kg) (pt-stated)  7 % weight loss      Depression Screen PHQ 2/9 Scores 04/01/2016 03/17/2016 02/24/2016  02/10/2016 01/21/2016 12/31/2015 11/06/2015  PHQ - 2 Score 1 0 0 1 0 0 0  PHQ- 9 Score - - - - - - -  Patient has had some concerns over grandchild. Behavioral Health contact info given  Fall Risk Fall Risk  04/01/2016 03/17/2016 02/24/2016 02/10/2016 01/21/2016  Falls in the past year? Yes No No Yes No  Number falls in past yr: 1 - - 1 -  Injury with Fall? No - - No -  Risk for fall due to : (No Data) - - - -  Risk for fall due to (comments): Footrest on recliner still up - - - -  Follow up Education provided;Falls prevention discussed - - - -    Cognitive Function: MMSE - Mini Mental State Exam 10/15/2010  Orientation to time 5  Orientation to Place 5  Registration 1  Attention/ Calculation 4  Recall 1  Language- name 2 objects 2  Language- repeat 1  Language- follow 3 step command 3  Language- read & follow direction 1  Write a sentence 1  Copy design 1  Total score 25  Mini-Cog  Passed with score 3/5  TUG Test:  Done in 22 seconds. Patient used both hands to push out of chair and to sit back down. Patient walked with rolling walker with seat and brakes. Used equipment appropriately.   Screening Tests Health Maintenance  Topic Date Due  . OPHTHALMOLOGY EXAM  10/31/2012  . TETANUS/TDAP  08/06/2015  . FOOT EXAM  06/04/2016  . HEMOGLOBIN A1C  09/14/2016  . INFLUENZA VACCINE  Completed  . DEXA SCAN  Completed  . ZOSTAVAX  Addressed  . PNA vac Low Risk Adult  Completed      Plan:     During the course of the visit, Brittany Roberts was educated and counseled about the following appropriate screening and preventive services:   Vaccines to include Pneumoccal, Influenza, Td, Zostavax  Cardiovascular disease screening  Colorectal cancer screening  Bone density screening  Diabetes screening  Glaucoma screening  Mammography/PAP  Nutrition counseling  Patient Instructions (the written plan) were given to the patient.    Brittany Heckler, RN   04/01/2016

## 2016-04-05 ENCOUNTER — Encounter: Payer: Self-pay | Admitting: *Deleted

## 2016-04-05 ENCOUNTER — Telehealth: Payer: Self-pay | Admitting: Family Medicine

## 2016-04-05 NOTE — Telephone Encounter (Signed)
[place] [employer] parking Placard form dropped off for at front desk for completion.  Patient stated she did not have time to fill it all out.  Last DOS/WCC with PCP was 04/01/16.   Patient stated Dr [name] could call her if any questions. Patient received a parking violation because of expired placard.Placed form in [place]  team folder to be completed by clinical staff.  Brittany Roberts

## 2016-04-05 NOTE — Progress Notes (Signed)
I have reviewed this visit and discussed with Lauren Ducatte, RN, BSN, and agree with her documentation.   

## 2016-04-06 NOTE — Telephone Encounter (Signed)
Patient informed that Placard form is complete and ready for pickup.  Patient wanted PCP to know she is very grateful for him completing the form for her.  Derl Barrow, RN

## 2016-04-06 NOTE — Telephone Encounter (Signed)
Clinical info completed on handicap placard form.  Place form in Dr. Lowella Bandy box for completion.  Brittany Roberts, [name], [name]

## 2016-04-06 NOTE — Telephone Encounter (Signed)
Form completed and passed on to Nurse Hassell Done.

## 2016-04-13 DIAGNOSIS — J45909 Unspecified asthma, uncomplicated: Secondary | ICD-10-CM | POA: Diagnosis not present

## 2016-04-15 ENCOUNTER — Telehealth: Payer: Self-pay | Admitting: Family Medicine

## 2016-04-15 ENCOUNTER — Other Ambulatory Visit: Payer: Self-pay | Admitting: Family Medicine

## 2016-04-15 DIAGNOSIS — N6452 Nipple discharge: Secondary | ICD-10-CM

## 2016-04-15 DIAGNOSIS — N644 Mastodynia: Secondary | ICD-10-CM

## 2016-04-15 NOTE — Telephone Encounter (Signed)
Pt needs a referral to [facility] for a diagnostic mammogram and u/s. Pt would like this ASAP because pt is very concerned. Please advise. Thanks! ep

## 2016-04-20 DIAGNOSIS — N644 Mastodynia: Secondary | ICD-10-CM | POA: Insufficient documentation

## 2016-04-20 NOTE — Telephone Encounter (Signed)
Right breast pain and discharge

## 2016-04-20 NOTE — Assessment & Plan Note (Signed)
Right breast pain and nipple discharge

## 2016-04-20 NOTE — Telephone Encounter (Signed)
Contacted pt to inform her that her appointment for her mammo and [place] is on 04/26/16 at the [facility]. She is to arrive at 12:40pm. Katharina Caper, Jabree Pernice D, CMA

## 2016-04-26 ENCOUNTER — Ambulatory Visit
Admission: RE | Admit: 2016-04-26 | Discharge: 2016-04-26 | Disposition: A | Discharge status: Home / Self Care | Payer: Medicare Other | Source: Ambulatory Visit | Attending: Family Medicine | Admitting: Family Medicine

## 2016-04-26 DIAGNOSIS — R928 Other abnormal and inconclusive findings on diagnostic imaging of breast: Secondary | ICD-10-CM | POA: Diagnosis not present

## 2016-04-26 DIAGNOSIS — N6489 Other specified disorders of breast: Secondary | ICD-10-CM | POA: Diagnosis not present

## 2016-04-26 DIAGNOSIS — N644 Mastodynia: Secondary | ICD-10-CM

## 2016-04-28 DIAGNOSIS — B079 Viral wart, unspecified: Secondary | ICD-10-CM | POA: Diagnosis not present

## 2016-04-28 DIAGNOSIS — D492 Neoplasm of unspecified behavior of bone, soft tissue, and skin: Secondary | ICD-10-CM | POA: Diagnosis not present

## 2016-04-29 DIAGNOSIS — G5602 Carpal tunnel syndrome, left upper limb: Secondary | ICD-10-CM | POA: Diagnosis not present

## 2016-04-29 DIAGNOSIS — M1812 Unilateral primary osteoarthritis of first carpometacarpal joint, left hand: Secondary | ICD-10-CM | POA: Diagnosis not present

## 2016-04-29 DIAGNOSIS — M654 Radial styloid tenosynovitis [de Quervain]: Secondary | ICD-10-CM | POA: Diagnosis not present

## 2016-04-29 DIAGNOSIS — M1811 Unilateral primary osteoarthritis of first carpometacarpal joint, right hand: Secondary | ICD-10-CM | POA: Diagnosis not present

## 2016-05-13 ENCOUNTER — Other Ambulatory Visit: Payer: Self-pay | Admitting: *Deleted

## 2016-05-13 DIAGNOSIS — K5900 Constipation, unspecified: Secondary | ICD-10-CM

## 2016-05-13 DIAGNOSIS — I10 Essential (primary) hypertension: Secondary | ICD-10-CM

## 2016-05-13 DIAGNOSIS — M159 Polyosteoarthritis, unspecified: Secondary | ICD-10-CM

## 2016-05-13 MED ORDER — ATORVASTATIN CALCIUM 20 MG PO TABS: 20.0000 mg | ORAL_TABLET | Freq: Every day | ORAL | 3 refills | Status: DC

## 2016-05-13 MED ORDER — TRAMADOL HCL 50 MG PO TABS: ORAL_TABLET | ORAL | 5 refills | Status: DC

## 2016-05-13 MED ORDER — NITROGLYCERIN 0.4 MG SL SUBL: 0.4000 mg | SUBLINGUAL_TABLET | SUBLINGUAL | 12 refills | Status: DC | PRN

## 2016-05-13 MED ORDER — METOPROLOL SUCCINATE ER 50 MG PO TB24: 50.0000 mg | ORAL_TABLET | Freq: Every day | ORAL | 3 refills | Status: DC

## 2016-05-13 MED ORDER — FUROSEMIDE 20 MG PO TABS: 20.0000 mg | ORAL_TABLET | Freq: Every day | ORAL | 0 refills | Status: DC

## 2016-05-13 MED ORDER — ALBUTEROL SULFATE HFA 108 (90 BASE) MCG/ACT IN AERS: INHALATION_SPRAY | RESPIRATORY_TRACT | 3 refills | Status: DC

## 2016-05-13 MED ORDER — ISOSORBIDE MONONITRATE ER 120 MG PO TB24: 120.0000 mg | ORAL_TABLET | Freq: Every day | ORAL | 3 refills | Status: DC

## 2016-05-13 MED ORDER — PANTOPRAZOLE SODIUM 40 MG PO TBEC: 40.0000 mg | DELAYED_RELEASE_TABLET | Freq: Two times a day (BID) | ORAL | 3 refills | Status: DC

## 2016-05-13 MED ORDER — FLUTICASONE PROPIONATE HFA 220 MCG/ACT IN AERO: INHALATION_SPRAY | RESPIRATORY_TRACT | 3 refills | Status: DC

## 2016-05-13 MED ORDER — LEVOTHYROXINE SODIUM 50 MCG PO TABS: 50.0000 ug | ORAL_TABLET | Freq: Every day | ORAL | 3 refills | Status: DC

## 2016-05-13 MED ORDER — PSYLLIUM 28 % PO PACK: 1.0000 | PACK | Freq: Two times a day (BID) | ORAL | 12 refills | Status: DC

## 2016-05-13 MED ORDER — DULOXETINE HCL 60 MG PO CPEP: 60.0000 mg | ORAL_CAPSULE | Freq: Every day | ORAL | 0 refills | Status: DC

## 2016-05-13 MED ORDER — GABAPENTIN 300 MG PO CAPS: 300.0000 mg | ORAL_CAPSULE | Freq: Two times a day (BID) | ORAL | 0 refills | Status: DC

## 2016-05-13 MED ORDER — TRAZODONE HCL 50 MG PO TABS: 50.0000 mg | ORAL_TABLET | Freq: Every day | ORAL | 3 refills | Status: DC

## 2016-05-13 MED ORDER — DICLOFENAC SODIUM 1 % TD GEL: 2.0000 g | Freq: Four times a day (QID) | TRANSDERMAL | 12 refills | Status: DC | PRN

## 2016-05-13 MED ORDER — INSULIN NPH (HUMAN) (ISOPHANE) 100 UNIT/ML ~~LOC~~ SUSP: SUBCUTANEOUS | 3 refills | Status: DC

## 2016-05-13 NOTE — Telephone Encounter (Signed)
Pharmacist from [facility] called stating this will be patient's new pharmacy.  They are located near her home.  He is requesting all medications be sent to them.  Derl Barrow, RN

## 2016-05-14 ENCOUNTER — Telehealth: Payer: Self-pay | Admitting: *Deleted

## 2016-05-14 DIAGNOSIS — J45909 Unspecified asthma, uncomplicated: Secondary | ICD-10-CM | POA: Diagnosis not present

## 2016-05-14 MED ORDER — INSULIN PEN NEEDLE 31G X 8 MM MISC: 1 refills | Status: DC

## 2016-05-14 MED ORDER — INSULIN ISOPHANE HUMAN 100 UNIT/ML KWIKPEN: 30.0000 [IU] | PEN_INJECTOR | Freq: Every morning | SUBCUTANEOUS | 3 refills | Status: DC

## 2016-05-14 NOTE — Telephone Encounter (Signed)
Pharmacist from [facility] called requesting to change Humulin N vial to Pen and order for pen needles.  Verbal order given by Dr. [name] to change: Humulin N

## 2016-05-19 ENCOUNTER — Ambulatory Visit (INDEPENDENT_AMBULATORY_CARE_PROVIDER_SITE_OTHER): Payer: Medicare Other | Admitting: Family Medicine

## 2016-05-19 ENCOUNTER — Encounter: Payer: Self-pay | Admitting: Family Medicine

## 2016-05-19 DIAGNOSIS — E1149 Type 2 diabetes mellitus with other diabetic neurological complication: Secondary | ICD-10-CM

## 2016-05-19 DIAGNOSIS — E039 Hypothyroidism, unspecified: Secondary | ICD-10-CM | POA: Diagnosis not present

## 2016-05-19 DIAGNOSIS — N183 Chronic kidney disease, stage 3 unspecified: Secondary | ICD-10-CM

## 2016-05-19 DIAGNOSIS — L930 Discoid lupus erythematosus: Secondary | ICD-10-CM | POA: Diagnosis not present

## 2016-05-19 DIAGNOSIS — K219 Gastro-esophageal reflux disease without esophagitis: Secondary | ICD-10-CM

## 2016-05-19 MED ORDER — METFORMIN HCL 500 MG PO TABS: 500.0000 mg | ORAL_TABLET | Freq: Two times a day (BID) | ORAL | 3 refills | Status: DC

## 2016-05-19 MED ORDER — LEVOTHYROXINE SODIUM 25 MCG PO TABS: 25.0000 ug | ORAL_TABLET | Freq: Every day | ORAL | 3 refills | Status: DC

## 2016-05-19 MED ORDER — MOMETASONE FUROATE 0.1 % EX OINT: TOPICAL_OINTMENT | Freq: Every day | CUTANEOUS | 3 refills | Status: DC

## 2016-05-19 NOTE — Assessment & Plan Note (Signed)
Nicely stable.  I feel safe in adding low dose metformin.

## 2016-05-19 NOTE — Progress Notes (Signed)
   Subjective:    Patient ID: Brittany Roberts, female    DOB: 1941/03/22, 75 y.o.   MRN: [id number]  HPI Several issues as always with Brittany Roberts: 1. DM suboptimal control  I am not sure why I have her on only once daily NPH (usually a bid drug)  She is reluctant to take two shots per day and also reluctant to switch to lantus.  Metformin is usable, last creat=1.30. 2. HPDP, needs eye exam. 3. Last TSH was low.  On levothyroxine 50 mcg 4. On protonix bid for GERD.  She complains of morning burping/belching. 5. Wt cycles and she is at a low end for her for weight.    Review of Systems     Objective:   Physical ExamVS noted. Lungs clear Cardiac RRR without m or g Ext trace bilateral edema.        Assessment & Plan:

## 2016-05-19 NOTE — Assessment & Plan Note (Addendum)
Add metformen.  I believe I stopped this when the creat cutoffs were rigid.  I will restart and not push the dose.

## 2016-05-19 NOTE — Patient Instructions (Addendum)
For your diabetes, I keep your insulin the same, no change.  I did add metformin pills twice a day morning and evening.   Please do get the eye exam done and have the doctor send me the report.   The movement of your mouth is called tardive dyskinesia. I want to cut down on your thyroid medicine dose.  I am going to send in a new prescription.   Try to take a dose of maalox or mylanta or tums every morning as you are getting up.

## 2016-05-19 NOTE — Assessment & Plan Note (Signed)
Symptomatic.  Add antacid.

## 2016-05-19 NOTE — Assessment & Plan Note (Signed)
Low TSH.  Decrease replacement levothyroxine.

## 2016-06-13 DIAGNOSIS — J45909 Unspecified asthma, uncomplicated: Secondary | ICD-10-CM | POA: Diagnosis not present

## 2016-07-06 DIAGNOSIS — M654 Radial styloid tenosynovitis [de Quervain]: Secondary | ICD-10-CM | POA: Diagnosis not present

## 2016-07-06 DIAGNOSIS — G5602 Carpal tunnel syndrome, left upper limb: Secondary | ICD-10-CM | POA: Diagnosis not present

## 2016-07-06 DIAGNOSIS — M1812 Unilateral primary osteoarthritis of first carpometacarpal joint, left hand: Secondary | ICD-10-CM | POA: Diagnosis not present

## 2016-07-06 DIAGNOSIS — M1811 Unilateral primary osteoarthritis of first carpometacarpal joint, right hand: Secondary | ICD-10-CM | POA: Diagnosis not present

## 2016-07-12 DIAGNOSIS — Z9861 Coronary angioplasty status: Secondary | ICD-10-CM | POA: Diagnosis not present

## 2016-07-12 DIAGNOSIS — I5042 Chronic combined systolic (congestive) and diastolic (congestive) heart failure: Secondary | ICD-10-CM | POA: Diagnosis not present

## 2016-07-12 DIAGNOSIS — I10 Essential (primary) hypertension: Secondary | ICD-10-CM | POA: Diagnosis not present

## 2016-07-12 DIAGNOSIS — I2510 Atherosclerotic heart disease of native coronary artery without angina pectoris: Secondary | ICD-10-CM | POA: Diagnosis not present

## 2016-07-14 DIAGNOSIS — J45909 Unspecified asthma, uncomplicated: Secondary | ICD-10-CM | POA: Diagnosis not present

## 2016-08-03 DIAGNOSIS — G5602 Carpal tunnel syndrome, left upper limb: Secondary | ICD-10-CM | POA: Diagnosis not present

## 2016-08-03 DIAGNOSIS — M1812 Unilateral primary osteoarthritis of first carpometacarpal joint, left hand: Secondary | ICD-10-CM | POA: Diagnosis not present

## 2016-08-03 DIAGNOSIS — M654 Radial styloid tenosynovitis [de Quervain]: Secondary | ICD-10-CM | POA: Diagnosis not present

## 2016-08-03 DIAGNOSIS — M1811 Unilateral primary osteoarthritis of first carpometacarpal joint, right hand: Secondary | ICD-10-CM | POA: Diagnosis not present

## 2016-08-10 DIAGNOSIS — E119 Type 2 diabetes mellitus without complications: Secondary | ICD-10-CM | POA: Diagnosis not present

## 2016-08-14 DIAGNOSIS — J45909 Unspecified asthma, uncomplicated: Secondary | ICD-10-CM | POA: Diagnosis not present

## 2016-08-27 ENCOUNTER — Other Ambulatory Visit: Payer: Self-pay | Admitting: Family Medicine

## 2016-08-30 MED ORDER — "PEN NEEDLES 5/16"" 31G X 8 MM MISC": 3 refills | Status: DC

## 2016-08-30 NOTE — Addendum Note (Signed)
Addended by: Zenia Resides on: 08/30/2016 08:47 AM   Modules accepted: Orders

## 2016-09-01 ENCOUNTER — Telehealth: Payer: Self-pay | Admitting: Family Medicine

## 2016-09-01 NOTE — Telephone Encounter (Signed)
Pt needs a refill on test strips. Pt uses [facility]. ep

## 2016-09-01 NOTE — Telephone Encounter (Signed)
Please call and ask what type of test strips.  Derl Barrow, RN

## 2016-09-08 ENCOUNTER — Encounter: Payer: Self-pay | Admitting: Family Medicine

## 2016-09-08 ENCOUNTER — Ambulatory Visit: Payer: Medicaid Other | Admitting: Family Medicine

## 2016-09-08 ENCOUNTER — Ambulatory Visit (INDEPENDENT_AMBULATORY_CARE_PROVIDER_SITE_OTHER): Payer: Medicare Other | Admitting: Family Medicine

## 2016-09-08 VITALS — BP 118/60 | HR 76 | Temp 98.3°F | Ht 63.0 in | Wt 268.0 lb

## 2016-09-08 DIAGNOSIS — E039 Hypothyroidism, unspecified: Secondary | ICD-10-CM | POA: Diagnosis not present

## 2016-09-08 DIAGNOSIS — E1149 Type 2 diabetes mellitus with other diabetic neurological complication: Secondary | ICD-10-CM | POA: Diagnosis not present

## 2016-09-08 DIAGNOSIS — I5042 Chronic combined systolic (congestive) and diastolic (congestive) heart failure: Secondary | ICD-10-CM | POA: Diagnosis not present

## 2016-09-08 DIAGNOSIS — Z794 Long term (current) use of insulin: Secondary | ICD-10-CM

## 2016-09-08 DIAGNOSIS — E119 Type 2 diabetes mellitus without complications: Secondary | ICD-10-CM

## 2016-09-08 DIAGNOSIS — D649 Anemia, unspecified: Secondary | ICD-10-CM | POA: Diagnosis not present

## 2016-09-08 DIAGNOSIS — I10 Essential (primary) hypertension: Secondary | ICD-10-CM | POA: Diagnosis not present

## 2016-09-08 LAB — COMPLETE METABOLIC PANEL WITH GFR
ALT: 12 U/L (ref 6–29)
AST: 18 U/L (ref 10–35)
Albumin: 3.6 g/dL (ref 3.6–5.1)
Alkaline Phosphatase: 146 U/L — ABNORMAL HIGH (ref 33–130)
BUN: 20 mg/dL (ref 7–25)
CO2: 24 mmol/L (ref 20–31)
Calcium: 8.8 mg/dL (ref 8.6–10.4)
Chloride: 107 mmol/L (ref 98–110)
Creat: 1.43 mg/dL — ABNORMAL HIGH (ref 0.60–0.93)
GFR, Est African American: 41 mL/min — ABNORMAL LOW (ref >=60)
GFR, Est Non African American: 36 mL/min — ABNORMAL LOW (ref >=60)
Glucose, Bld: 162 mg/dL — ABNORMAL HIGH (ref 65–99)
Potassium: 4.6 mmol/L (ref 3.5–5.3)
Sodium: 141 mmol/L (ref 135–146)
Total Bilirubin: 0.3 mg/dL (ref 0.2–1.2)
Total Protein: 7.9 g/dL (ref 6.1–8.1)

## 2016-09-08 LAB — POCT GLYCOSYLATED HEMOGLOBIN (HGB A1C): Hemoglobin A1C: 6.9

## 2016-09-08 LAB — TSH: TSH: 0.36 mIU/L — ABNORMAL LOW

## 2016-09-08 LAB — CBC
HCT: 35.3 % (ref 35.0–45.0)
Hemoglobin: 11.2 g/dL — ABNORMAL LOW (ref 11.7–15.5)
MCH: 24.5 pg — ABNORMAL LOW (ref 27.0–33.0)
MCHC: 31.7 g/dL — ABNORMAL LOW (ref 32.0–36.0)
MCV: 77.1 fL — ABNORMAL LOW (ref 80.0–100.0)
MPV: 9.4 fL (ref 7.5–12.5)
Platelets: 253 10*3/uL (ref 140–400)
RBC: 4.58 MIL/uL (ref 3.80–5.10)
RDW: 16 % — ABNORMAL HIGH (ref 11.0–15.0)
WBC: 9.8 10*3/uL (ref 3.8–10.8)

## 2016-09-08 LAB — FERRITIN: Ferritin: 104 ng/mL (ref 20–288)

## 2016-09-08 NOTE — Patient Instructions (Signed)
I am so proud of you for doing such a great job with your health. See me in three months. Check your medicine list with mine to be sure it is right.  Call me if any problems. I will call with the blood test results.

## 2016-09-09 NOTE — Progress Notes (Signed)
   Subjective:    Patient ID: Brittany Roberts, female    DOB: July 28, 1940, 76 y.o.   MRN: [id number]  HPI I am delighted that Brittany Roberts has gotten quite serious about her diet and weight.  The appointment and CC of rash were around the issue of bidil.  She recently saw her cardiologist who told her she was doing well and still wanted to put her on bidil for her CHF.  She protested because she did not tolerate in the past.  Dr. Einar Gip talked her into it.  Developed rash and vague symptoms of not feeling well.  Stopped on her own and now feels better.  The great news is that she is losing wt, which is helping all her chronic conditions: 1. DM.  No complaints.  A1C today is great.  Does need foot exam.  States she has had an eye exam with Dr. Idolina Primer. 2. COPD. No complaints of SOB.  Feels better breathing since wt loss.   3. CKD 3 from DM.  Due for recheck.  ACE contraindicated.   4. Hx of anemia (iron deficiency versus chronic disease) Due for a recheck.   5. Hypothyroid.  Replacement decrease because of low TSH.  Recheck. 6. CHF, no edema.  On lasix and beta blocker (ACE contraindicated.  Improved DOE with wt loss     Review of Systems     Objective:   Physical Exam Lungs clear Cardiac RRR without m or g Ext trace bilateral edema.        Assessment & Plan:

## 2016-09-09 NOTE — Assessment & Plan Note (Signed)
Stable.  ACe contraindicated.  Off bidil due to side effects.

## 2016-09-09 NOTE — Assessment & Plan Note (Signed)
Stable, improved with wt loss

## 2016-09-09 NOTE — Assessment & Plan Note (Signed)
Check labs, which show improvement.  Ferritin normal

## 2016-09-09 NOTE — Assessment & Plan Note (Signed)
With low dose and low TSH, will DC levothyroxine.

## 2016-09-09 NOTE — Assessment & Plan Note (Signed)
AIC is great with wt loss

## 2016-09-09 NOTE — Assessment & Plan Note (Signed)
Nice improvement with diet.

## 2016-09-11 DIAGNOSIS — J45909 Unspecified asthma, uncomplicated: Secondary | ICD-10-CM | POA: Diagnosis not present

## 2016-10-12 DIAGNOSIS — J45909 Unspecified asthma, uncomplicated: Secondary | ICD-10-CM | POA: Diagnosis not present

## 2016-10-15 ENCOUNTER — Ambulatory Visit (INDEPENDENT_AMBULATORY_CARE_PROVIDER_SITE_OTHER): Payer: Medicare Other | Admitting: Family Medicine

## 2016-10-15 ENCOUNTER — Encounter: Payer: Self-pay | Admitting: Family Medicine

## 2016-10-15 ENCOUNTER — Other Ambulatory Visit (HOSPITAL_COMMUNITY)
Admission: RE | Admit: 2016-10-15 | Discharge: 2016-10-15 | Disposition: A | Discharge status: Home / Self Care | Payer: Medicare Other | Source: Ambulatory Visit | Attending: Family Medicine | Admitting: Family Medicine

## 2016-10-15 VITALS — BP 126/70 | HR 66 | Temp 100.4°F | Ht 63.0 in | Wt 270.0 lb

## 2016-10-15 DIAGNOSIS — N898 Other specified noninflammatory disorders of vagina: Secondary | ICD-10-CM

## 2016-10-15 LAB — POCT WET PREP (WET MOUNT)
Clue Cells Wet Prep Whiff POC: NEGATIVE
Trichomonas Wet Prep HPF POC: ABSENT

## 2016-10-15 MED ORDER — METRONIDAZOLE 500 MG PO TABS: 500.0000 mg | ORAL_TABLET | Freq: Two times a day (BID) | ORAL | 0 refills | Status: AC

## 2016-10-15 NOTE — Patient Instructions (Addendum)
It was great seeing you today! We have addressed the following issues today  1. I will send you to get an Ultrasound of your lower abdomen and pelvic to further evaluate your pain  2. I will start you on Flagyl an antibiotic that you will take two times a day for a week. 3. Please return to clinic if symptoms do not get better  If we did any lab work today, and the results require attention, either me or my nurse will get in touch with you. If everything is normal, you will get a letter in mail and a message via . If you don't hear from [place] in two weeks, please give [place] a call. Otherwise, we look forward to seeing you again at your next visit. If you have any questions or concerns before then, please call the clinic at [phone].  Please bring all your medications to every doctors visit  Sign up for My Chart to have easy access to your labs results, and communication with your Primary care physician.    Please check-out at the front desk before leaving the clinic.    Take Care,   Dr. Andy Gauss

## 2016-10-16 NOTE — Progress Notes (Signed)
Date of Visit: 10/15/2016   HPI:  Patient presents for a same day appointment to discuss vaginal discharge. Patient reports that symptoms started about a week ago when she noticed white discharge, that was clumpy at times and malodorous. Patient also endorses right lower quadrant pain which has been chronic. Patient also reports being warm at home but never took her temperature. Patient reports urinary incontinence for which she wears adult diapers but denies any dysuria, increase frequency or hesitancy. Patient denies sexual activity, recent weight loss, blood in stools, nausea , vomiting.  ROS: all systems negative except per HPI  [place]: Past Medical History:  Diagnosis Date  . Anxiety   . Arthritis    "all over"  . Asthma   . Bipolar disorder ([place])   . Complication of anesthesia    "w/right foot OR they gave me too much and they couldn't get me woke"  . Congestive heart failure, unspecified   . Coronary artery disease    "I've got 1 stent" (06/30/2015)  . Depression   . Discoid lupus   . Fibromyalgia    "I've been told I have this; can't take Lyrica cause I'm allergic to it" (06/30/2015)  . GERD (gastroesophageal reflux disease)   . History of gout   . History of hiatal hernia    "real bad" (06/30/2015)  . Hypertension   . Hypothyroidism   . On home oxygen therapy    "2L q hs" (06/30/2015)  . OSA (obstructive sleep apnea)    "just use my oxygen; no mask" (06/30/2015)  . Other and unspecified hyperlipidemia   . Penetrating foot wound    left nonhealing foot wound on the dorsal surface  . Peripheral neuropathy   . Pneumonia "many of times"  . Refusal of blood transfusions as patient is Jehovah's Witness   . Renal failure, unspecified    "kidneys not working 100%" (06/30/2015)  . Sickle cell trait ([place])   . Systemic lupus ([place])   . Type II diabetes mellitus ([place])   . Unspecified vitamin D deficiency     PHYSICAL EXAM: BP 126/70 (BP Location: Right Wrist, Patient  Position: Sitting, Cuff Size: Normal)   Pulse 66   Temp (!) 100.4 F (38 C) (Oral)   Ht 5\' 3"  (1.6 m)   Wt 270 lb (122.5 kg)   SpO2 92%   BMI 47.83 kg/m   General: NAD, pleasant, able to participate in exam Cardiac: RRR, normal heart sounds, no murmurs. 2+ radial and PT pulses bilaterally Respiratory: CTAB, normal effort, No wheezes, rales or rhonchi Abdomen: soft, nontender, nondistended, no hepatic or splenomegaly, +BS GU: Speculum exam showed thin white discharge, normal cervix and vaginal canal. Exterior anatomy without any abnormal findings. Bimanual exam not performed due to patient body habitus and large abdomen Extremities: no edema or cyanosis. WWP. Skin: warm and dry, no rashes noted Neuro: alert and oriented x4, no focal deficits Psych: Normal affect and mood  ASSESSMENT/PLAN:  #Vaginal discharge, acute Given patient age and lack sexual activity it is unusual to have discharge that is not physiologic. Patient was febrile today in clinic and seem to have had fever at home though not measured. Patient also endorse some lower abdominal pain that could point toward and infectious or malignant process in the pelvis. Wet prep today was negative for trichomonas, bacterial vaginosis  and yeast infection. Because patient is febrile and is complaining of symptoms similar to BV/trichomonas we will treat with antibiotics. Patient with a low likelihood for [facility]  or PID though still a possibility with her fever. --Start patient on metronidazole 500 mg bid for 7 days  --Order a transvaginal US to r/o ovarian or cervical malignancy. Patient with history of calcified uterine fibroids.  FOLLOW UP: Follow up with PCP if symptoms do not improve with current treatment regimen.  Marjie Skiff, MD [place]

## 2016-10-18 LAB — CERVICOVAGINAL ANCILLARY ONLY
Chlamydia: NEGATIVE
Neisseria Gonorrhea: NEGATIVE

## 2016-10-22 ENCOUNTER — Ambulatory Visit (HOSPITAL_COMMUNITY)
Admission: RE | Admit: 2016-10-22 | Discharge: 2016-10-22 | Disposition: A | Discharge status: Home / Self Care | Payer: Medicare Other | Source: Ambulatory Visit | Attending: Family Medicine | Admitting: Family Medicine

## 2016-10-22 ENCOUNTER — Ambulatory Visit (HOSPITAL_COMMUNITY): Payer: Medicare Other

## 2016-10-22 DIAGNOSIS — D259 Leiomyoma of uterus, unspecified: Secondary | ICD-10-CM | POA: Diagnosis not present

## 2016-10-22 DIAGNOSIS — N898 Other specified noninflammatory disorders of vagina: Secondary | ICD-10-CM | POA: Insufficient documentation

## 2016-10-22 DIAGNOSIS — R102 Pelvic and perineal pain: Secondary | ICD-10-CM | POA: Diagnosis not present

## 2016-11-10 ENCOUNTER — Ambulatory Visit: Payer: Medicare Other | Admitting: Family Medicine

## 2016-11-11 DIAGNOSIS — J45909 Unspecified asthma, uncomplicated: Secondary | ICD-10-CM | POA: Diagnosis not present

## 2016-12-12 DIAGNOSIS — J45909 Unspecified asthma, uncomplicated: Secondary | ICD-10-CM | POA: Diagnosis not present

## 2016-12-14 ENCOUNTER — Other Ambulatory Visit: Payer: Self-pay | Admitting: Family Medicine

## 2016-12-14 DIAGNOSIS — I10 Essential (primary) hypertension: Secondary | ICD-10-CM

## 2016-12-22 ENCOUNTER — Telehealth: Payer: Self-pay | Admitting: Family Medicine

## 2016-12-22 DIAGNOSIS — I10 Essential (primary) hypertension: Secondary | ICD-10-CM

## 2016-12-22 MED ORDER — AMLODIPINE BESYLATE 10 MG PO TABS: 10.0000 mg | ORAL_TABLET | Freq: Every day | ORAL | 3 refills | Status: DC

## 2016-12-22 NOTE — Telephone Encounter (Signed)
Pt is concerned about BP, it is 181/81 and pt has a headache. Pt wants to speak with a nurse. ep

## 2016-12-22 NOTE — Telephone Encounter (Signed)
Noted and agree. 

## 2016-12-22 NOTE — Telephone Encounter (Signed)
Return call to patient regarding her blood pressure.  Pt reported blood pressure of 181/91.  She has been having a headache for about 2 weeks now. She has not taken her medications at the time of phone call.  Reviewed patient medication with her.  Pt has not been taking amlodipine in a while.  Advised patient to take blood pressure medications and retake BP.  Will call patient back in a few to get new blood pressure reading.  Advised patient that she should be seen for blood pressure and headache.  Appointment for tomorrow at 9:45 with same day provider.  Will forward to PCP for further advise.  Derl Barrow, RN

## 2016-12-22 NOTE — Addendum Note (Signed)
Addended by: Derl Barrow on: 12/22/2016 01:17 PM   Modules accepted: Orders

## 2016-12-23 ENCOUNTER — Ambulatory Visit (INDEPENDENT_AMBULATORY_CARE_PROVIDER_SITE_OTHER): Payer: Medicare Other | Admitting: Family Medicine

## 2016-12-23 ENCOUNTER — Encounter: Payer: Self-pay | Admitting: Family Medicine

## 2016-12-23 VITALS — BP 134/65 | HR 77 | Temp 98.4°F | Wt 265.8 lb

## 2016-12-23 DIAGNOSIS — F209 Schizophrenia, unspecified: Secondary | ICD-10-CM

## 2016-12-23 DIAGNOSIS — J449 Chronic obstructive pulmonary disease, unspecified: Secondary | ICD-10-CM | POA: Diagnosis not present

## 2016-12-23 DIAGNOSIS — I10 Essential (primary) hypertension: Secondary | ICD-10-CM | POA: Diagnosis not present

## 2016-12-23 MED ORDER — DULOXETINE HCL 30 MG PO CPEP: 30.0000 mg | ORAL_CAPSULE | Freq: Every day | ORAL | 0 refills | Status: DC

## 2016-12-23 MED ORDER — GUAIFENESIN ER 600 MG PO TB12: 600.0000 mg | ORAL_TABLET | Freq: Two times a day (BID) | ORAL | 0 refills | Status: DC

## 2016-12-23 MED ORDER — QUETIAPINE FUMARATE 25 MG PO TABS: 25.0000 mg | ORAL_TABLET | Freq: Every day | ORAL | 0 refills | Status: DC

## 2016-12-23 NOTE — Patient Instructions (Signed)
Take Mucinex for the sputum.  I have decreased your Cymbalta dosage to 30mg . I have added Seroquel 25mg  at bedtime.  Please follow up with Dr. [name] in 1-2 weeks.  Quetiapine tablets What is this medicine? QUETIAPINE (kwe TYE a peen) is an antipsychotic. It is used to treat schizophrenia and bipolar disorder, also known as manic-depression. This medicine may be used for other purposes; ask your health care provider or pharmacist if you have questions. COMMON BRAND NAME(S): Seroquel What should I tell my health care provider before I take this medicine? They need to know if you have any of these conditions: -brain tumor or head injury -breast cancer -cataracts -diabetes -difficulty swallowing -heart disease -kidney disease -liver disease -low blood counts, like low white cell, platelet, or red cell counts -low blood pressure or dizziness when standing up -Parkinson's disease -previous heart attack -seizures -suicidal thoughts, plans, or attempt by you or a family member -thyroid disease -an unusual or allergic reaction to quetiapine, other medicines, foods, dyes, or preservatives -pregnant or trying to get pregnant -breast-feeding How should I use this medicine? Take this medicine by mouth. Swallow it with a drink of water. Follow the directions on the prescription label. If it upsets your stomach you can take it with food. Take your medicine at regular intervals. Do not take it more often than directed. Do not stop taking except on the advice of your doctor or health care professional. A special MedGuide will be given to you by the pharmacist with each prescription and refill. Be sure to read this information carefully each time. Talk to your pediatrician regarding the use of this medicine in children. While this drug may be prescribed for children as young as 10 years for selected conditions, precautions do apply. Patients over age 20 years may have a stronger reaction to this  medicine and need smaller doses. Overdosage: If you think you have taken too much of this medicine contact a poison control center or emergency room at once. NOTE: This medicine is only for you. Do not share this medicine with others. What if I miss a dose? If you miss a dose, take it as soon as you can. If it is almost time for your next dose, take only that dose. Do not take double or extra doses. What may interact with this medicine? Do not take this medicine with any of the following medications: -certain medicines for fungal infections like fluconazole, itraconazole, ketoconazole, posaconazole, voriconazole -cisapride -dofetilide -dronedarone -droperidol -grepafloxacin -halofantrine -phenothiazines like chlorpromazine, mesoridazine, thioridazine -pimozide -sparfloxacin -ziprasidone This medicine may also interact with the following medications: -alcohol -antiviral medicines for HIV or AIDS -certain medicines for blood pressure -certain medicines for depression, anxiety, or psychotic disturbances like haloperidol, lorazepam -certain medicines for diabetes -certain medicines for Parkinson's disease -certain medicines for seizures like carbamazepine, phenobarbital, phenytoin -cimetidine -erythromycin -other medicines that prolong the QT interval (cause an abnormal heart rhythm) -rifampin -steroid medicines like prednisone or cortisone This list may not describe all possible interactions. Give your health care provider a list of all the medicines, herbs, non-prescription drugs, or dietary supplements you use. Also tell them if you smoke, drink alcohol, or use illegal drugs. Some items may interact with your medicine. What should I watch for while using this medicine? Visit your doctor or health care professional for regular checks on your progress. It may be several weeks before you see the full effects of this medicine. Your health care provider may suggest that you have your eyes  examined prior to starting this medicine, and every 6 months thereafter. If you have been taking this medicine regularly for some time, do not suddenly stop taking it. You must gradually reduce the dose or your symptoms may get worse. Ask your doctor or health care professional for advice. Patients and their families should watch out for worsening depression or thoughts of suicide. Also watch out for sudden or severe changes in feelings such as feeling anxious, agitated, panicky, irritable, hostile, aggressive, impulsive, severely restless, overly excited and hyperactive, or not being able to sleep. If this happens, especially at the beginning of antidepressant treatment or after a change in dose, call your health care professional. Dennis Bast may get dizzy or drowsy. Do not drive, use machinery, or do anything that needs mental alertness until you know how this medicine affects you. Do not stand or sit up quickly, especially if you are an older patient. This reduces the risk of dizzy or fainting spells. Alcohol can increase dizziness and drowsiness. Avoid alcoholic drinks. Do not treat yourself for colds, diarrhea or allergies. Ask your doctor or health care professional for advice, some ingredients may increase possible side effects. This medicine can reduce the response of your body to heat or cold. Dress warm in cold weather and stay hydrated in hot weather. If possible, avoid extreme temperatures like saunas, hot tubs, very hot or cold showers, or activities that can cause dehydration such as vigorous exercise. What side effects may I notice from receiving this medicine? Side effects that you should report to your doctor or health care professional as soon as possible: -allergic reactions like skin rash, itching or hives, swelling of the face, lips, or tongue -difficulty swallowing -fast or irregular heartbeat -fever or chills, sore throat -fever with rash, swollen lymph nodes, or swelling of the  face -increased hunger or thirst -increased urination -problems with balance, talking, walking -seizures -stiff muscles -suicidal thoughts or other mood changes -uncontrollable head, mouth, neck, arm, or leg movements -unusually weak or tired Side effects that usually do not require medical attention (report to your doctor or health care professional if they continue or are bothersome): -change in sex drive or performance -constipation -drowsy or dizzy -dry mouth -stomach upset -weight gain This list may not describe all possible side effects. Call your doctor for medical advice about side effects. You may report side effects to FDA at 1-800-FDA-1088. Where should I keep my medicine? Keep out of the reach of children. Store at room temperature between 15 and 30 degrees C (59 and 86 degrees F). Throw away any unused medicine after the expiration date. NOTE: This sheet is a summary. It may not cover all possible information. If you have questions about this medicine, talk to your doctor, pharmacist, or health care provider.  2018 Elsevier/Gold Standard (2014-12-24 13:07:35)

## 2016-12-23 NOTE — Progress Notes (Signed)
Subjective: CC:Blood pressure  HPI: Patient is a 76 y.o. female with a past medical history of T2DM, asthma, HTN, COPD, sleep apnea, hypothyroidism, CKD III, depression, schizophrenia  presenting to clinic today for a SDA because she was worried her BP was high (it was not) but notes concerns for hearing people in her home and needing prednisone. She is accompanied by her daughter  1) BP:  Patient states she thought her BP was high, but it is not.  She had a headache on Monday or Tuesday night which made her think this. Headache has since resolved.  2. Hallucinations Watching TV in the living room on Tuesday States she went to bed.  Sounded like someone was doing dishes in her kitchen which woke her up. Called out but didn't get an answer.  Didn't go check it out. Went the bathroom and went back to bed. No more issues with dishes rattling, but she knew someone was in her kitchen.  The subsequent night it sounded like someone was in her bedroom. It sounded like they were "rubbing someone's back" She states she's heard people in her home that weren't there in the past. Denies visual hallucinations. Denies command type auditory hallucinations. No SI or HI.  Daughter states pt doesn't want her to leave at night, she's left her mother's home around 9:30 each evening.  The patient cries and begs her not to go.  She thinks its a combination of loniless and depression Was previously on Buspar (pt doesn't recall). Currently on Cymbalta.   3. Needs prednisone  Has had increased sputum production x 7 weeks. States she was treated by someone a few weeks ago (I can't see in our chart) No increased SOB.  No increased cough. No increased wheezing. No chest pain.   Social History: lives alone, has home health aid that comes 2.5hrs per day.    ROS: All other systems reviewed and are negative.  Past Medical History Patient Active Problem List   Diagnosis Date Noted  . Mastalgia  04/20/2016  . Bilateral leg edema 02/10/2016  . Hearing difficulty 10/30/2015  . Midline low back pain with right-sided sciatica 09/25/2015  . Angina pectoris ([place]) 06/30/2015  . Dysarthria 02/14/2015  . Sinusitis, chronic 10/25/2014  . Schizophrenia, unspecified type ([place]) 10/25/2014  . Type 2 diabetes mellitus with complication ([place])   . Cephalalgia   . Hx of systemic lupus erythematosus (SLE)   . Thyroid mass 04/26/2014  . Unspecified constipation 12/10/2013  . Benign neoplasm of colon 12/10/2013  . Controlled type 2 diabetes with neuropathy ([place]) 11/27/2013  . Cystic kidney disease 11/09/2013  . Insulin dependent type 2 diabetes mellitus ([place]) 10/29/2013  . Hoarseness or changing voice 09/27/2013  . Dyskinesia, tardive 07/11/2013  . TMJ syndrome 02/16/2013  . Angina pectoris associated with type 2 diabetes mellitus ([place]) 08/08/2012  . CAD in native artery 08/08/2012  . Asthma, chronic, PRESUMED wiht MULTIFACTORIAL DYSPNEA 05/30/2012  . Insomnia 09/22/2011  . Mobility poor 02/02/2011  . Chronic kidney disease, stage III (moderate) 05/13/2010  . Chronic combined systolic and diastolic CHF, NYHA class 2 ([place]) 03/31/2010  . COPD (chronic obstructive pulmonary disease) ([place]) 03/26/2010  . Morbid obesity ([place]) 02/27/2010  . DM (diabetes mellitus), type 2 with neurological complications ([place]) 98/92/1194  . Osteoarthritis 04/25/2009  . Anemia 03/11/2007  . COGNITIVE IMPAIRMENT, MILD, SO STATED 03/11/2007  . RESTLESS LEG SYNDROME 03/11/2007  . LUPUS ERYTHEMATOSUS, DISCOID 03/11/2007  . Hypothyroidism 03/07/2007  . DYSLIPIDEMIA 03/07/2007  .  DEPRESSION 03/07/2007  . Essential hypertension 03/07/2007  . Coronary atherosclerosis 03/07/2007  . GERD 03/07/2007  . SLEEP APNEA 03/07/2007    Medications- reviewed and updated Current Outpatient Prescriptions  Medication Sig Dispense Refill  . albuterol (PROAIR HFA) 108 (90 Base) MCG/ACT inhaler inhale 2 puffs by mouth every 4 hours  if needed USE ONLY IF YOU ARE WHEEZING 3 Inhaler 3  . amLODipine (NORVASC) 10 MG tablet Take 1 tablet (10 mg total) by mouth daily. 90 tablet 3  . Ascorbic Acid (VITAMIN C PO) Take 1 tablet by mouth daily.    Marland Kitchen aspirin EC 81 MG tablet Take 162 mg by mouth daily.     Marland Kitchen atorvastatin (LIPITOR) 20 MG tablet Take 1 tablet (20 mg total) by mouth daily. 90 tablet 3  . B Complex-Biotin-FA (B COMPLETE) TABS Take 1 tablet by mouth daily.    . Cholecalciferol (VITAMIN D3) 5000 UNITS CAPS Take 5,000 Units by mouth daily.    . DULoxetine (CYMBALTA) 30 MG capsule Take 1 capsule (30 mg total) by mouth daily. 30 capsule 0  . fluticasone (FLOVENT HFA) 220 MCG/ACT inhaler inhale 1 puff by mouth twice a day 36 g 3  . furosemide (LASIX) 20 MG tablet Take 1 tablet (20 mg total) by mouth daily. 30 tablet 0  . gabapentin (NEURONTIN) 300 MG capsule Take 1 capsule (300 mg total) by mouth 2 (two) times daily. 180 capsule 3  . guaiFENesin (MUCINEX) 600 MG 12 hr tablet Take 1 tablet (600 mg total) by mouth 2 (two) times daily. 20 tablet 0  . Insulin NPH, Human,, Isophane, (HUMULIN N KWIKPEN) 100 UNIT/ML Kiwkpen Inject 30 Units into the skin every morning. 15 mL 3  . Insulin Pen Needle (PEN NEEDLES 31GX5/16") 31G X 8 MM MISC Check Blood sugar three times per day. Dx insulin using E11.8 100 each 3  . metFORMIN (GLUCOPHAGE) 500 MG tablet Take 1 tablet (500 mg total) by mouth 2 (two) times daily with a meal. 180 tablet 3  . metoprolol succinate (TOPROL-XL) 50 MG 24 hr tablet TAKE ONE TABLET BY MOUTH ONCE DAILY WITH OR IMMEDIATELY FOLLOWING A MEAL 90 tablet 3  . mometasone (ELOCON) 0.1 % ointment Apply topically daily. 45 g 3  . Multiple Vitamin (MULTIVITAMIN WITH MINERALS) TABS tablet Take 1 tablet by mouth daily.    . nitroGLYCERIN (NITROSTAT) 0.4 MG SL tablet Place 1 tablet (0.4 mg total) under the tongue every 5 (five) minutes as needed for chest pain. 20 tablet 12  . pantoprazole (PROTONIX) 40 MG tablet Take 1 tablet (40 mg  total) by mouth 2 (two) times daily. 180 tablet 3  . psyllium (METAMUCIL SMOOTH TEXTURE) 28 % packet Take 1 packet by mouth 2 (two) times daily. 30 packet 12  . QUEtiapine (SEROQUEL) 25 MG tablet Take 1 tablet (25 mg total) by mouth at bedtime. 30 tablet 0  . traMADol (ULTRAM) 50 MG tablet TAKE ONE TABLET BY MOUTH THREE TIMES A DAY. 90 tablet 5  . traZODone (DESYREL) 50 MG tablet Take 1 tablet (50 mg total) by mouth at bedtime. 90 tablet 3   No current facility-administered medications for this visit.     Objective: Office vital signs reviewed. BP 134/65 (BP Location: Left Arm, Patient Position: Sitting, Cuff Size: Large)   Pulse 77   Temp 98.4 F (36.9 C) (Oral)   Wt 265 lb 12.8 oz (120.6 kg)   SpO2 92%   BMI 47.08 kg/m    Physical Examination:  General: Awake,  alert, well- nourished, NAD ENMT:  TMs intact, normal light reflex, no erythema, no bulging. Nasal turbinates moist. MMM, Oropharynx clear without erythema or tonsillar exudate/hypertrophy Eyes: Conjunctiva non-injected. Cardio: RRR, no m/r/g noted.  Pulm: No increased WOB.  CTAB, without wheezes, rhonchi or crackles noted.  Psych: Alert. Intermittently tangential in thought and speech. Speech non-pressured. Appropriate rate and tone. Does not appear internally distracted.  Assessment/Plan: Essential hypertension At goal. Continue current regimen  COPD (chronic obstructive pulmonary disease) Pt endorses increased sputum production and states she needs prednisone to "dry it up."  No other new symptoms.  - Mucinex  - will hold off on steroids and antibiotics as I do not think this is an exacerbation and I feel steroids may worsen her hallucinations.   Schizophrenia, unspecified type Sounds as though she's currently having auditory hallucinations. Daughter more concerned about loneliness and depression. Does have a home aid for a short period of time. In the past never needed an antipsychotic however I feel she may benefit  from this. Not a threat to herself or others. Discussed with her PCP who is in agreement with plan.  - Seroquel 25mg  qhs - decrease cymbalta to 30mg  daily  - follow up with PCP in 1-2 weeks or sooner as needed.    No orders of the defined types were placed in this encounter.   Meds ordered this encounter  Medications  . DULoxetine (CYMBALTA) 30 MG capsule    Sig: Take 1 capsule (30 mg total) by mouth daily.    Dispense:  30 capsule    Refill:  0  . QUEtiapine (SEROQUEL) 25 MG tablet    Sig: Take 1 tablet (25 mg total) by mouth at bedtime.    Dispense:  30 tablet    Refill:  0  . guaiFENesin (MUCINEX) 600 MG 12 hr tablet    Sig: Take 1 tablet (600 mg total) by mouth 2 (two) times daily.    Dispense:  20 tablet    Refill:  [facility] PGY-3, [place]

## 2016-12-24 NOTE — Assessment & Plan Note (Signed)
At goal. Continue current regimen. 

## 2016-12-24 NOTE — Assessment & Plan Note (Signed)
Pt endorses increased sputum production and states she needs prednisone to "dry it up."  No other new symptoms.  - Mucinex  - will hold off on steroids and antibiotics as I do not think this is an exacerbation and I feel steroids may worsen her hallucinations.

## 2016-12-24 NOTE — Assessment & Plan Note (Signed)
Sounds as though she's currently having auditory hallucinations. Daughter more concerned about loneliness and depression. Does have a home aid for a short period of time. In the past never needed an antipsychotic however I feel she may benefit from this. Not a threat to herself or others. Discussed with her PCP who is in agreement with plan.  - Seroquel 25mg  qhs - decrease cymbalta to 30mg  daily  - follow up with PCP in 1-2 weeks or sooner as needed.

## 2016-12-29 ENCOUNTER — Ambulatory Visit (INDEPENDENT_AMBULATORY_CARE_PROVIDER_SITE_OTHER): Payer: Medicare Other | Admitting: Family Medicine

## 2016-12-29 ENCOUNTER — Encounter: Payer: Self-pay | Admitting: Family Medicine

## 2016-12-29 VITALS — BP 162/78 | HR 74 | Temp 99.1°F | Ht 63.0 in | Wt 272.8 lb

## 2016-12-29 DIAGNOSIS — F209 Schizophrenia, unspecified: Secondary | ICD-10-CM

## 2016-12-29 DIAGNOSIS — E1149 Type 2 diabetes mellitus with other diabetic neurological complication: Secondary | ICD-10-CM | POA: Diagnosis not present

## 2016-12-29 DIAGNOSIS — I10 Essential (primary) hypertension: Secondary | ICD-10-CM

## 2016-12-29 LAB — POCT GLYCOSYLATED HEMOGLOBIN (HGB A1C): Hemoglobin A1C: 7.6

## 2016-12-29 MED ORDER — QUETIAPINE FUMARATE 25 MG PO TABS
50.0000 mg | ORAL_TABLET | Freq: Every day | ORAL | 9 refills | Status: DC
Start: 2016-12-29 — End: 2017-01-18

## 2016-12-29 NOTE — Assessment & Plan Note (Addendum)
Will increase seroquel.  Unclear if developing dementia.  Next blood draw I will order B12 and RPR to complete the work up.  Recheck one month.  Goal is to find out what meds she is taking and then try to simplify.

## 2016-12-29 NOTE — Patient Instructions (Addendum)
Please be sure to go over all your medications with your daughter. Call me if my list is different than you are taking. One change - take two seroquel every night.  I sent in a new prescription.

## 2016-12-30 NOTE — Assessment & Plan Note (Signed)
A1C=7.6.  No change except daughter to verify compliance.

## 2016-12-30 NOTE — Assessment & Plan Note (Signed)
Poor control.  No change for now until I verify what meds she is taking.

## 2016-12-30 NOTE — Progress Notes (Signed)
   Subjective:    Patient ID: Brittany Roberts, female    DOB: 1941/06/16, 76 y.o.   MRN: [id number]  HPI Comes in with daughter.  Major issue is follow up delusions.  Seen by dr. Lorenso Roberts last week and begun on seroquel.  (also decreased cymbalta.) Delusions are less but still present.  Carries dx of chronic schizophrenia and in the remote past I assume she was on significant dose of major tranquilzers to develop tardive dyskinesia.  Has not needed major tranquilzers over past several years until now.   Also getting older.  Has had much of the dementia WU done in the recent past.  Has normal CBC, CMP and TSH.  Had normal MRI in past 2 years.  No RPR or B12.  Chronic schizophrenia of course clouds the diagnosis of dementia.    Also clouding the diagnosis is compliance with meds.  Brittany Roberts tries to be compliant but is often confused.  Now daughter is taking over med management but is not aware of the meds she is supposed to be on.  Did not bring meds today.    Review of Systems     Objective:   Physical Exam  BP up Gen: responds appropriately to questions and does not seem delusional in room. Lungs clear Cardiac RRR without m or g         Assessment & Plan:

## 2016-12-31 ENCOUNTER — Telehealth: Payer: Self-pay | Admitting: Family Medicine

## 2016-12-31 NOTE — Telephone Encounter (Signed)
Will forward to MD to advise. Beverlyn Mcginness,CMA  

## 2016-12-31 NOTE — Telephone Encounter (Signed)
Pt started taking Seroquel and did not sleep at all last night. Pt was up all night urinating. Pt would like PCP to call her. ep

## 2017-01-03 NOTE — Telephone Encounter (Signed)
It is difficult to talk over the phone with her due to her limited understanding.  She feels that the trazodone helps her more for sleep.  However, it is not an antipsychotic.  Recommended (I hope she understood) she take both the trazodone and the seroquel at night.

## 2017-01-04 DIAGNOSIS — I2510 Atherosclerotic heart disease of native coronary artery without angina pectoris: Secondary | ICD-10-CM | POA: Diagnosis not present

## 2017-01-04 DIAGNOSIS — Z9861 Coronary angioplasty status: Secondary | ICD-10-CM | POA: Diagnosis not present

## 2017-01-04 DIAGNOSIS — I10 Essential (primary) hypertension: Secondary | ICD-10-CM | POA: Diagnosis not present

## 2017-01-04 DIAGNOSIS — I5042 Chronic combined systolic (congestive) and diastolic (congestive) heart failure: Secondary | ICD-10-CM | POA: Diagnosis not present

## 2017-01-11 DIAGNOSIS — J45909 Unspecified asthma, uncomplicated: Secondary | ICD-10-CM | POA: Diagnosis not present

## 2017-01-13 ENCOUNTER — Emergency Department (HOSPITAL_COMMUNITY): Payer: Medicare Other

## 2017-01-13 ENCOUNTER — Inpatient Hospital Stay (HOSPITAL_COMMUNITY)
Admission: EM | Admit: 2017-01-13 | Discharge: 2017-01-18 | DRG: 101 | Disposition: A | Discharge status: Skilled Nursing Facility | Payer: Medicare Other | Attending: Family Medicine | Admitting: Family Medicine

## 2017-01-13 DIAGNOSIS — Z8673 Personal history of transient ischemic attack (TIA), and cerebral infarction without residual deficits: Secondary | ICD-10-CM

## 2017-01-13 DIAGNOSIS — G40409 Other generalized epilepsy and epileptic syndromes, not intractable, without status epilepticus: Secondary | ICD-10-CM | POA: Diagnosis not present

## 2017-01-13 DIAGNOSIS — F209 Schizophrenia, unspecified: Secondary | ICD-10-CM | POA: Diagnosis present

## 2017-01-13 DIAGNOSIS — Z832 Family history of diseases of the blood and blood-forming organs and certain disorders involving the immune mechanism: Secondary | ICD-10-CM

## 2017-01-13 DIAGNOSIS — Z87891 Personal history of nicotine dependence: Secondary | ICD-10-CM | POA: Diagnosis not present

## 2017-01-13 DIAGNOSIS — E1122 Type 2 diabetes mellitus with diabetic chronic kidney disease: Secondary | ICD-10-CM | POA: Diagnosis not present

## 2017-01-13 DIAGNOSIS — I129 Hypertensive chronic kidney disease with stage 1 through stage 4 chronic kidney disease, or unspecified chronic kidney disease: Secondary | ICD-10-CM | POA: Diagnosis not present

## 2017-01-13 DIAGNOSIS — K219 Gastro-esophageal reflux disease without esophagitis: Secondary | ICD-10-CM | POA: Diagnosis present

## 2017-01-13 DIAGNOSIS — R569 Unspecified convulsions: Secondary | ICD-10-CM | POA: Diagnosis not present

## 2017-01-13 DIAGNOSIS — D573 Sickle-cell trait: Secondary | ICD-10-CM | POA: Diagnosis present

## 2017-01-13 DIAGNOSIS — E039 Hypothyroidism, unspecified: Secondary | ICD-10-CM | POA: Diagnosis present

## 2017-01-13 DIAGNOSIS — I422 Other hypertrophic cardiomyopathy: Secondary | ICD-10-CM | POA: Diagnosis not present

## 2017-01-13 DIAGNOSIS — E118 Type 2 diabetes mellitus with unspecified complications: Secondary | ICD-10-CM | POA: Diagnosis present

## 2017-01-13 DIAGNOSIS — Z66 Do not resuscitate: Secondary | ICD-10-CM | POA: Diagnosis not present

## 2017-01-13 DIAGNOSIS — R4189 Other symptoms and signs involving cognitive functions and awareness: Secondary | ICD-10-CM | POA: Diagnosis present

## 2017-01-13 DIAGNOSIS — G4700 Insomnia, unspecified: Secondary | ICD-10-CM | POA: Diagnosis not present

## 2017-01-13 DIAGNOSIS — M25531 Pain in right wrist: Secondary | ICD-10-CM | POA: Diagnosis not present

## 2017-01-13 DIAGNOSIS — R278 Other lack of coordination: Secondary | ICD-10-CM | POA: Diagnosis not present

## 2017-01-13 DIAGNOSIS — M6281 Muscle weakness (generalized): Secondary | ICD-10-CM | POA: Diagnosis not present

## 2017-01-13 DIAGNOSIS — D72829 Elevated white blood cell count, unspecified: Secondary | ICD-10-CM | POA: Diagnosis not present

## 2017-01-13 DIAGNOSIS — E785 Hyperlipidemia, unspecified: Secondary | ICD-10-CM | POA: Diagnosis not present

## 2017-01-13 DIAGNOSIS — Z794 Long term (current) use of insulin: Secondary | ICD-10-CM | POA: Diagnosis not present

## 2017-01-13 DIAGNOSIS — I130 Hypertensive heart and chronic kidney disease with heart failure and stage 1 through stage 4 chronic kidney disease, or unspecified chronic kidney disease: Secondary | ICD-10-CM | POA: Diagnosis present

## 2017-01-13 DIAGNOSIS — E1140 Type 2 diabetes mellitus with diabetic neuropathy, unspecified: Secondary | ICD-10-CM | POA: Diagnosis present

## 2017-01-13 DIAGNOSIS — R4182 Altered mental status, unspecified: Secondary | ICD-10-CM | POA: Diagnosis present

## 2017-01-13 DIAGNOSIS — N189 Chronic kidney disease, unspecified: Secondary | ICD-10-CM | POA: Diagnosis not present

## 2017-01-13 DIAGNOSIS — Z961 Presence of intraocular lens: Secondary | ICD-10-CM | POA: Diagnosis present

## 2017-01-13 DIAGNOSIS — Z9981 Dependence on supplemental oxygen: Secondary | ICD-10-CM

## 2017-01-13 DIAGNOSIS — I700 Atherosclerosis of aorta: Secondary | ICD-10-CM | POA: Diagnosis present

## 2017-01-13 DIAGNOSIS — E1151 Type 2 diabetes mellitus with diabetic peripheral angiopathy without gangrene: Secondary | ICD-10-CM | POA: Diagnosis present

## 2017-01-13 DIAGNOSIS — Z825 Family history of asthma and other chronic lower respiratory diseases: Secondary | ICD-10-CM

## 2017-01-13 DIAGNOSIS — Z8249 Family history of ischemic heart disease and other diseases of the circulatory system: Secondary | ICD-10-CM

## 2017-01-13 DIAGNOSIS — Z833 Family history of diabetes mellitus: Secondary | ICD-10-CM

## 2017-01-13 DIAGNOSIS — I5042 Chronic combined systolic (congestive) and diastolic (congestive) heart failure: Secondary | ICD-10-CM | POA: Diagnosis not present

## 2017-01-13 DIAGNOSIS — N183 Chronic kidney disease, stage 3 (moderate): Secondary | ICD-10-CM | POA: Diagnosis present

## 2017-01-13 DIAGNOSIS — Z8261 Family history of arthritis: Secondary | ICD-10-CM

## 2017-01-13 DIAGNOSIS — G8929 Other chronic pain: Secondary | ICD-10-CM | POA: Diagnosis present

## 2017-01-13 DIAGNOSIS — R262 Difficulty in walking, not elsewhere classified: Secondary | ICD-10-CM | POA: Diagnosis not present

## 2017-01-13 DIAGNOSIS — R499 Unspecified voice and resonance disorder: Secondary | ICD-10-CM | POA: Diagnosis present

## 2017-01-13 DIAGNOSIS — G3184 Mild cognitive impairment, so stated: Secondary | ICD-10-CM | POA: Diagnosis present

## 2017-01-13 DIAGNOSIS — M329 Systemic lupus erythematosus, unspecified: Secondary | ICD-10-CM | POA: Diagnosis present

## 2017-01-13 DIAGNOSIS — M797 Fibromyalgia: Secondary | ICD-10-CM | POA: Diagnosis not present

## 2017-01-13 DIAGNOSIS — Z809 Family history of malignant neoplasm, unspecified: Secondary | ICD-10-CM

## 2017-01-13 DIAGNOSIS — G4733 Obstructive sleep apnea (adult) (pediatric): Secondary | ICD-10-CM | POA: Diagnosis present

## 2017-01-13 DIAGNOSIS — Z841 Family history of disorders of kidney and ureter: Secondary | ICD-10-CM

## 2017-01-13 DIAGNOSIS — Z9842 Cataract extraction status, left eye: Secondary | ICD-10-CM

## 2017-01-13 DIAGNOSIS — R410 Disorientation, unspecified: Secondary | ICD-10-CM | POA: Diagnosis not present

## 2017-01-13 DIAGNOSIS — I2510 Atherosclerotic heart disease of native coronary artery without angina pectoris: Secondary | ICD-10-CM | POA: Diagnosis present

## 2017-01-13 DIAGNOSIS — J449 Chronic obstructive pulmonary disease, unspecified: Secondary | ICD-10-CM | POA: Diagnosis not present

## 2017-01-13 DIAGNOSIS — R0602 Shortness of breath: Secondary | ICD-10-CM | POA: Diagnosis not present

## 2017-01-13 DIAGNOSIS — R2681 Unsteadiness on feet: Secondary | ICD-10-CM | POA: Diagnosis not present

## 2017-01-13 DIAGNOSIS — R1312 Dysphagia, oropharyngeal phase: Secondary | ICD-10-CM | POA: Diagnosis not present

## 2017-01-13 DIAGNOSIS — R488 Other symbolic dysfunctions: Secondary | ICD-10-CM | POA: Diagnosis not present

## 2017-01-13 DIAGNOSIS — N179 Acute kidney failure, unspecified: Secondary | ICD-10-CM | POA: Diagnosis not present

## 2017-01-13 DIAGNOSIS — Z9841 Cataract extraction status, right eye: Secondary | ICD-10-CM

## 2017-01-13 DIAGNOSIS — I639 Cerebral infarction, unspecified: Secondary | ICD-10-CM | POA: Diagnosis present

## 2017-01-13 DIAGNOSIS — Z955 Presence of coronary angioplasty implant and graft: Secondary | ICD-10-CM

## 2017-01-13 HISTORY — DX: Atherosclerosis of aorta: I70.0

## 2017-01-13 HISTORY — DX: Disorientation, unspecified: R41.0

## 2017-01-13 HISTORY — DX: Atherosclerotic heart disease of native coronary artery without angina pectoris: I25.10

## 2017-01-13 LAB — COMPREHENSIVE METABOLIC PANEL
ALT: 15 U/L (ref 14–54)
AST: 32 U/L (ref 15–41)
Albumin: 3.6 g/dL (ref 3.5–5.0)
Alkaline Phosphatase: 132 U/L — ABNORMAL HIGH (ref 38–126)
Anion gap: 12 (ref 5–15)
BUN: 7 mg/dL (ref 6–20)
CO2: 20 mmol/L — ABNORMAL LOW (ref 22–32)
Calcium: 9.5 mg/dL (ref 8.9–10.3)
Chloride: 104 mmol/L (ref 101–111)
Creatinine, Ser: 1.22 mg/dL — ABNORMAL HIGH (ref 0.44–1.00)
GFR calc Af Amer: 48 mL/min — ABNORMAL LOW (ref >60)
GFR calc non Af Amer: 42 mL/min — ABNORMAL LOW (ref >60)
Glucose, Bld: 311 mg/dL — ABNORMAL HIGH (ref 65–99)
Potassium: 4.2 mmol/L (ref 3.5–5.1)
Sodium: 136 mmol/L (ref 135–145)
Total Bilirubin: 0.8 mg/dL (ref 0.3–1.2)
Total Protein: 8.6 g/dL — ABNORMAL HIGH (ref 6.5–8.1)

## 2017-01-13 LAB — CBC WITH DIFFERENTIAL/PLATELET
Basophils Absolute: 0 10*3/uL (ref 0.0–0.1)
Basophils Relative: 0 %
Eosinophils Absolute: 0 10*3/uL (ref 0.0–0.7)
Eosinophils Relative: 0 %
HCT: 37.5 % (ref 36.0–46.0)
Hemoglobin: 12.7 g/dL (ref 12.0–15.0)
Lymphocytes Relative: 8 %
Lymphs Abs: 1.1 10*3/uL (ref 0.7–4.0)
MCH: 24.1 pg — ABNORMAL LOW (ref 26.0–34.0)
MCHC: 33.9 g/dL (ref 30.0–36.0)
MCV: 71 fL — ABNORMAL LOW (ref 78.0–100.0)
Monocytes Absolute: 0.4 10*3/uL (ref 0.1–1.0)
Monocytes Relative: 3 %
Neutro Abs: 12 10*3/uL — ABNORMAL HIGH (ref 1.7–7.7)
Neutrophils Relative %: 89 %
Platelets: 244 10*3/uL (ref 150–400)
RBC: 5.28 MIL/uL — ABNORMAL HIGH (ref 3.87–5.11)
RDW: 14.7 % (ref 11.5–15.5)
WBC: 13.5 10*3/uL — ABNORMAL HIGH (ref 4.0–10.5)

## 2017-01-13 LAB — D-DIMER, QUANTITATIVE (NOT AT ARMC): D-Dimer, Quant: 2.09 ug/mL-FEU — ABNORMAL HIGH (ref 0.00–0.50)

## 2017-01-13 LAB — VITAMIN B12: Vitamin B-12: 621 pg/mL (ref 180–914)

## 2017-01-13 LAB — GLUCOSE, CAPILLARY: Glucose-Capillary: 233 mg/dL — ABNORMAL HIGH (ref 65–99)

## 2017-01-13 MED ORDER — FLUTICASONE PROPIONATE HFA 220 MCG/ACT IN AERO: 1.0000 | INHALATION_SPRAY | Freq: Two times a day (BID) | RESPIRATORY_TRACT | Status: DC

## 2017-01-13 MED ORDER — ENOXAPARIN SODIUM 30 MG/0.3ML ~~LOC~~ SOLN
30.0000 mg | SUBCUTANEOUS | Status: DC
  Administered 2017-01-13: 30 mg via SUBCUTANEOUS
  Filled 2017-01-13: qty 0.3

## 2017-01-13 MED ORDER — SODIUM CHLORIDE 0.9 % IV SOLN
INTRAVENOUS | Status: DC
  Administered 2017-01-13 – 2017-01-14 (×2): via INTRAVENOUS

## 2017-01-13 MED ORDER — IOPAMIDOL (ISOVUE-370) INJECTION 76%
INTRAVENOUS | Status: AC
  Filled 2017-01-13: qty 100

## 2017-01-13 MED ORDER — MOMETASONE FUROATE 0.1 % EX CREA: TOPICAL_CREAM | Freq: Every day | CUTANEOUS | Status: DC | PRN

## 2017-01-13 MED ORDER — INSULIN ASPART 100 UNIT/ML ~~LOC~~ SOLN
0.0000 [IU] | Freq: Three times a day (TID) | SUBCUTANEOUS | Status: DC
  Administered 2017-01-14: 3 [IU] via SUBCUTANEOUS
  Administered 2017-01-14 – 2017-01-15 (×2): 2 [IU] via SUBCUTANEOUS
  Administered 2017-01-15: 5 [IU] via SUBCUTANEOUS
  Administered 2017-01-15: 1 [IU] via SUBCUTANEOUS
  Administered 2017-01-15: 3 [IU] via SUBCUTANEOUS
  Administered 2017-01-16 (×3): 2 [IU] via SUBCUTANEOUS
  Administered 2017-01-16: 3 [IU] via SUBCUTANEOUS
  Administered 2017-01-17: 2 [IU] via SUBCUTANEOUS
  Administered 2017-01-17: 3 [IU] via SUBCUTANEOUS
  Administered 2017-01-17: 1 [IU] via SUBCUTANEOUS
  Administered 2017-01-18: 2 [IU] via SUBCUTANEOUS
  Administered 2017-01-18: 1 [IU] via SUBCUTANEOUS

## 2017-01-13 MED ORDER — BUDESONIDE 0.5 MG/2ML IN SUSP
0.5000 mg | Freq: Two times a day (BID) | RESPIRATORY_TRACT | Status: DC
  Administered 2017-01-14 – 2017-01-18 (×7): 0.5 mg via RESPIRATORY_TRACT
  Filled 2017-01-13 (×9): qty 2

## 2017-01-13 MED ORDER — MOMETASONE FUROATE 0.1 % EX OINT: 1.0000 "application " | TOPICAL_OINTMENT | Freq: Every day | CUTANEOUS | Status: DC | PRN

## 2017-01-13 MED ORDER — IOPAMIDOL (ISOVUE-370) INJECTION 76%
INTRAVENOUS | Status: AC
  Administered 2017-01-13: 100 mL
  Filled 2017-01-13: qty 100

## 2017-01-13 MED ORDER — LORAZEPAM 1 MG PO TABS
0.5000 mg | ORAL_TABLET | Freq: Once | ORAL | Status: AC
  Administered 2017-01-13: 0.5 mg via ORAL
  Filled 2017-01-13: qty 1

## 2017-01-13 NOTE — H&P (Signed)
[facility] Admission History and Physical Service Pager: [phone]  Patient name: Brittany Roberts Medical record number: [id number] Date of birth: [birth year] Age: 76 y.o. Gender: female  Primary Care Provider: Zenia Resides, MD Consultants: None Code Status: DNR (confirmed on admission)  Chief Complaint: seizure-like activity  Assessment and Plan: Brittany Roberts is a 76 y.o. female presenting with seizure-like activity and AMS. PMH is significant for COPD, schizophrenia, HTN, Type II DM, HLD, GERD, depression, insomnia, hypothyroidism, SLE, chronic pain, hx TIA.   Altered mental status: Possibly due to medication, dementia, worsening schizophrenia, or post-ictal state. Patient with recent medication change (addition of Seroquel two weeks ago and decrease of dose of Cymbalta), and is also taking multiple other medications that could affect mental status (gabapentin, trazodone, tramadol). Patient has been managing medications herself and family unsure if she is taking properly. Per EHR, concern for development of dementia at last few visits, though work-up has been negative thus far, and would not expect such an abrupt change in mental status. Underlying dementia may be contributing however. Patient does have history of schizophrenia which may be etiology or contributing factor, and recently has reported auditory hallucinations. Patient with seizure-like activity prior to AMS, so possible prolonged post-ictal state. Patient does have mild leukocytosis to 13.5, however is afebrile and no obvious source, making infectious etiology less likely. CXR NL. Ingestion included on differential however UDS not yet collected. CT head with no acute findings - Admit to FPTS, attending Dr. McDiarmid - Seizure precautions - Neuro checks q2 - Holding home meds that may affect mental status (Seroquel, Cymbalta, trazodone, tramadol) - Decrease home gabapentin when cleared for PO intake -  NPO while altered - RPR, B12 - UA pending - UDS pending  Seizure-like activity: Reported twisting and jerking of head, mouth, and extremities with bladder incontinence and drooling for about 5 minutes. Subsequent period of unresponsiveness for 15 minutes. Remains altered since episode. Does have history of tardive dyskinesia per Epic records, though family unaware of this, and symptoms not typical of TD. Head CT with no acute findings.  - Seizure precautions - Neuro check q2 - Consider neuro consult in AM - UDS pending   Schizophrenia: Family unaware of diagnosis and patient unable to provide information. Started on Seroquel 25mg  qhs on 12/23/16 due to reports of auditory hallucinations.  - Discontinue Seroquel  - Consider sitter if patient becomes agitated - Will speak with patient's PCP (Dr. [name]) tomorrow to obtain more information as family unable to provide much information on this diagnosis  COPD: Former smoking, but not current tobacco use. On Flovent at home with albuterol PRN. Some SOB with movement in bed but appropriate O2 sats on RA. D-dimer elevated at 2.09, however CTA performed in ED which showed no pulmonary embolism or acute findings, though exam limited due to breathing motion artifact and body habitus.  - Continue home Flovent - Albuterol PRN wheezing - Continuous pulse ox - Supplemental O2 PRN  CHF: CXR with cardiomegaly but no evidence of CHF. On Lasix 20mg  at home. No LE edema or signs of fluid overload on exam today.  - Hold Lasix while NPO - IVF while NPO but closely monitor fluid status  CKD: Cr 1.22 on admit, which appears to be below patient's baseline of 1.3.  - IVF while NPO but closely monitor fluid status  Type II DM with neuropathy: Takes NPH 30U every AM. No sliding scale. Also prescribed gabapentin for neuropathy. Last A1C 7.6  on 12/28/16. CBG 311 in ED.  - Holding NPH while NPO - sSSI - Hold gabapentin while NPO; resume at decreased dose when  cleared for PO - CBG qACHS  Depression - Hold home Cymbalta   HTN: Hypertensive in ED. Prescribed amlodipine, lisinopril, and metoprolol at home.  - Hold home BP meds while NPO  GERD - Hold home Protonix while NPO  Hypothyroidism: On Synthroid. Last TSH 0.36 on 09/08/16.  - Holding Synthroid while NPO  Insomnia - Hold home trazodone  Chronic pain: Secondary to SLE per family report.  - Hold home tramadol  HLD: Last lipid panel NL in 07/2014.  - Hold home atorvastatin while NPO  FEN/GI: NPO while altered, NS Prophylaxis: Lovenox  Disposition: admit to FPTS  History of Present Illness:  Brittany Roberts is a 76 y.o. female presenting with seizure-like activity and altered mental status.   History provided by patient's family (two daughters and two granddaughters) as patient altered and unable to provide history herself.   Patient's daughter reports that around 0900 this AM, she went to check on the patient, and noticed that she was "twitching and jerking." Reports abnormal movements of patient's head, mouth, and all extremities. Episode lasted about 5 minutes. Patient subsequently "fell into a deep sleep," with daughter unable to wake her up, for about 15 minutes. Patient's granddaughters noticed that she had urinated during the episode. Since then patient has been incoherent per family's report and has had difficulty speaking. Family has actually noticed speech difficulties for the past 1-2 weeks, with patient's sentences not making sense. Patient has no history of seizures. Family thinks that her symptoms are due to Seroquel which was started two weeks ago due to possible auditory hallucinations and schizophrenia. Patient also fell four days ago, but family is unsure if she hit her head or not.  Prior to past two weeks, patient has been able to perform all ADLs, and was even driving herself. She does have a home health aid that comes to her house for two hours in the morning, but  otherwise has been living alone. She has been managing her own medications, which her daughters are concerned about. Family reports that patient has had chills recently but deny other symptoms.    Review Of Systems: Per HPI with the following additions: ROS limited due to patient's altered mental status.   Review of Systems  Constitutional: Positive for chills. Negative for fever.  Respiratory: Negative for cough.   Neurological: Positive for speech change, seizures (Seizure-like activity) and loss of consciousness. Negative for weakness.    Patient Active Problem List   Diagnosis Date Noted  . Altered mental status 01/13/2017  . Mastalgia 04/20/2016  . Bilateral leg edema 02/10/2016  . Hearing difficulty 10/30/2015  . Midline low back pain with right-sided sciatica 09/25/2015  . Angina pectoris ([place]) 06/30/2015  . Dysarthria 02/14/2015  . Sinusitis, chronic 10/25/2014  . Schizophrenia, unspecified type ([place]) 10/25/2014  . Type 2 diabetes mellitus with complication ([place])   . Cephalalgia   . Hx of systemic lupus erythematosus (SLE)   . Thyroid mass 04/26/2014  . Unspecified constipation 12/10/2013  . Benign neoplasm of colon 12/10/2013  . Controlled type 2 diabetes with neuropathy ([place]) 11/27/2013  . Cystic kidney disease 11/09/2013  . Insulin dependent type 2 diabetes mellitus ([place]) 10/29/2013  . Hoarseness or changing voice 09/27/2013  . Dyskinesia, tardive 07/11/2013  . TMJ syndrome 02/16/2013  . Angina pectoris associated with type 2 diabetes mellitus ([place]) 08/08/2012  .  CAD in native artery 08/08/2012  . Asthma, chronic, PRESUMED wiht MULTIFACTORIAL DYSPNEA 05/30/2012  . Insomnia 09/22/2011  . Mobility poor 02/02/2011  . Chronic kidney disease, stage III (moderate) 05/13/2010  . Chronic combined systolic and diastolic CHF, NYHA class 2 ([place]) 03/31/2010  . COPD (chronic obstructive pulmonary disease) ([place]) 03/26/2010  . Morbid obesity ([place]) 02/27/2010  . DM (diabetes  mellitus), type 2 with neurological complications ([place]) 41/74/0814  . Osteoarthritis 04/25/2009  . Anemia 03/11/2007  . COGNITIVE IMPAIRMENT, MILD, SO STATED 03/11/2007  . RESTLESS LEG SYNDROME 03/11/2007  . LUPUS ERYTHEMATOSUS, DISCOID 03/11/2007  . Hypothyroidism 03/07/2007  . DYSLIPIDEMIA 03/07/2007  . DEPRESSION 03/07/2007  . Essential hypertension 03/07/2007  . Coronary atherosclerosis 03/07/2007  . GERD 03/07/2007  . SLEEP APNEA 03/07/2007    Past Medical History: Past Medical History:  Diagnosis Date  . Anxiety   . Arthritis    "all over"  . Asthma   . Bipolar disorder ([place])   . Complication of anesthesia    "w/right foot OR they gave me too much and they couldn't get me woke"  . Congestive heart failure, unspecified   . Coronary artery disease    "I've got 1 stent" (06/30/2015)  . Depression   . Discoid lupus   . Fibromyalgia    "I've been told I have this; can't take Lyrica cause I'm allergic to it" (06/30/2015)  . GERD (gastroesophageal reflux disease)   . History of gout   . History of hiatal hernia    "real bad" (06/30/2015)  . Hypertension   . Hypothyroidism   . On home oxygen therapy    "2L q hs" (06/30/2015)  . OSA (obstructive sleep apnea)    "just use my oxygen; no mask" (06/30/2015)  . Other and unspecified hyperlipidemia   . Penetrating foot wound    left nonhealing foot wound on the dorsal surface  . Peripheral neuropathy   . Pneumonia "many of times"  . Refusal of blood transfusions as patient is Jehovah's Witness   . Renal failure, unspecified    "kidneys not working 100%" (06/30/2015)  . Sickle cell trait ([place])   . Systemic lupus ([place])   . Type II diabetes mellitus ([place])   . Unspecified vitamin D deficiency     Past Surgical History: Past Surgical History:  Procedure Laterality Date  . ANKLE ARTHROSCOPY WITH OPEN REDUCTION INTERNAL FIXATION (ORIF) Right 03/2011  . CARDIAC CATHETERIZATION N/A 07/01/2015   Procedure: Left Heart Cath and  Coronary Angiography;  Surgeon: Adrian Prows, MD;  Location: [facility];  Service: Cardiovascular;  Laterality: N/A;  . CARDIAC CATHETERIZATION N/A 07/01/2015   Procedure: Intravascular Pressure Wire/FFR Study;  Surgeon: Adrian Prows, MD;  Location: [place] [facility];  Service: Cardiovascular;  Laterality: N/A;  . CARPAL TUNNEL RELEASE Bilateral   . CATARACT EXTRACTION W/ INTRAOCULAR LENS  IMPLANT, BILATERAL Bilateral   . CHOLECYSTECTOMY OPEN    . COLONOSCOPY N/A 12/10/2013   Procedure: COLONOSCOPY;  Surgeon: Inda Castle, MD;  Location: [facility];  Service: Endoscopy;  Laterality: N/A;  . FRACTURE SURGERY    . INCISION AND DRAINAGE ABSCESS Left    foot "under my little toe"  . KNEE LIGAMENT RECONSTRUCTION Right   . LEFT HEART CATHETERIZATION WITH CORONARY ANGIOGRAM N/A 08/08/2012   Procedure: LEFT HEART CATHETERIZATION WITH CORONARY ANGIOGRAM;  Surgeon: [name], MD;  Location: [facility];  Service: Cardiovascular;  Laterality: N/A;  . NASAL SINUS SURGERY    . PERCUTANEOUS CORONARY  STENT INTERVENTION (PCI-S) N/A 08/21/2012   Procedure: PERCUTANEOUS CORONARY STENT INTERVENTION (PCI-S);  Surgeon: [name], MD;  Location: [facility] CATH LAB;  Service: Cardiovascular;  Laterality: N/A;  . TUBAL LIGATION      Social History: Social History  Substance Use Topics  . Smoking status: Former Smoker    Packs/day: 3.00    Years: 40.00    Types: Cigarettes    Start date: 07/05/1960    Quit date: 07/05/1998  . Smokeless tobacco: Never Used  . Alcohol use 0.0 oz/week     Comment: 06/30/2015 "used to drink a little alcohol on the weekends; not q weekend; nothing in years"   Please also refer to relevant sections of EMR.  Family History: Family History  Problem Relation Age of Onset  . Heart disease Mother   . Diabetes Mother   . Clotting disorder Mother   . Pneumonia Father   . Rheum arthritis Father   . Diabetes Sister   . Diabetes Sister   . Asthma Brother   . Cancer  Brother   . Kidney disease Brother   . Lupus Son   . Heart disease Son   . Heart disease Daughter   . Colon cancer Neg Hx     Allergies and Medications: Allergies  Allergen Reactions  . Ace Inhibitors Other (See Comments)    Coincided with sig bump in creat. Retried and creat bumped again.  Beulah Gandy [Isosorb Dinitrate-Hydralazine] Other (See Comments)    Sleep all the time.  Ebbie Ridge [Buspirone] Other (See Comments)    pain  . Pregabalin Swelling  . Ropinirole Hydrochloride Swelling  . Amantadines Rash  . Penicillins Swelling and Rash    Has patient had a PCN reaction causing immediate rash, facial/tongue/throat swelling, SOB or lightheadedness with hypotension: Yes Has patient had a PCN reaction causing severe rash involving mucus membranes or skin necrosis: Yes Has patient had a PCN reaction that required hospitalization: Yes Has patient had a PCN reaction occurring within the last 10 years: No If all of the above answers are "NO", then may proceed with Cephalosporin use.    No current facility-administered medications on file prior to encounter.    Current Outpatient Prescriptions on File Prior to Encounter  Medication Sig Dispense Refill  . albuterol (PROAIR HFA) 108 (90 Base) MCG/ACT inhaler inhale 2 puffs by mouth every 4 hours if needed USE ONLY IF YOU ARE WHEEZING 3 Inhaler 3  . amLODipine (NORVASC) 10 MG tablet Take 1 tablet (10 mg total) by mouth daily. 90 tablet 3  . Ascorbic Acid (VITAMIN C PO) Take 1 tablet by mouth daily.    Marland Kitchen aspirin EC 81 MG tablet Take 162 mg by mouth daily.     Marland Kitchen atorvastatin (LIPITOR) 20 MG tablet Take 1 tablet (20 mg total) by mouth daily. 90 tablet 3  . B Complex-Biotin-FA (B COMPLETE) TABS Take 1 tablet by mouth daily.    . Cholecalciferol (VITAMIN D3) 5000 UNITS CAPS Take 5,000 Units by mouth daily.    . DULoxetine (CYMBALTA) 30 MG capsule Take 1 capsule (30 mg total) by mouth daily. (Patient taking differently: Take 60 mg by mouth daily.  ) 30 capsule 0  . fluticasone (FLOVENT HFA) 220 MCG/ACT inhaler inhale 1 puff by mouth twice a day 36 g 3  . furosemide (LASIX) 20 MG tablet Take 1 tablet (20 mg total) by mouth daily. 30 tablet 0  . gabapentin (NEURONTIN) 300 MG capsule Take 1 capsule (300 mg total)  by mouth 2 (two) times daily. 180 capsule 3  . guaiFENesin (MUCINEX) 600 MG 12 hr tablet Take 1 tablet (600 mg total) by mouth 2 (two) times daily. 20 tablet 0  . Insulin NPH, Human,, Isophane, (HUMULIN N KWIKPEN) 100 UNIT/ML Kiwkpen Inject 30 Units into the skin every morning. 15 mL 3  . Insulin Pen Needle (PEN NEEDLES 31GX5/16") 31G X 8 MM MISC Check Blood sugar three times per day. Dx insulin using E11.8 100 each 3  . metFORMIN (GLUCOPHAGE) 500 MG tablet Take 1 tablet (500 mg total) by mouth 2 (two) times daily with a meal. 180 tablet 3  . metoprolol succinate (TOPROL-XL) 50 MG 24 hr tablet TAKE ONE TABLET BY MOUTH ONCE DAILY WITH OR IMMEDIATELY FOLLOWING A MEAL 90 tablet 3  . mometasone (ELOCON) 0.1 % ointment Apply topically daily. (Patient taking differently: Apply 1 application topically daily as needed (supplement). ) 45 g 3  . Multiple Vitamin (MULTIVITAMIN WITH MINERALS) TABS tablet Take 1 tablet by mouth daily.    . nitroGLYCERIN (NITROSTAT) 0.4 MG SL tablet Place 1 tablet (0.4 mg total) under the tongue every 5 (five) minutes as needed for chest pain. 20 tablet 12  . pantoprazole (PROTONIX) 40 MG tablet Take 1 tablet (40 mg total) by mouth 2 (two) times daily. 180 tablet 3  . psyllium (METAMUCIL SMOOTH TEXTURE) 28 % packet Take 1 packet by mouth 2 (two) times daily. 30 packet 12  . QUEtiapine (SEROQUEL) 25 MG tablet Take 2 tablets (50 mg total) by mouth at bedtime. 60 tablet 9  . traMADol (ULTRAM) 50 MG tablet TAKE ONE TABLET BY MOUTH THREE TIMES A DAY. 90 tablet 5  . traZODone (DESYREL) 50 MG tablet Take 1 tablet (50 mg total) by mouth at bedtime. 90 tablet 3    Objective: BP (!) 172/93 (BP Location: Left Arm)   Pulse  95   Temp 99.5 F (37.5 C) (Oral)   Resp (!) 28   Ht 5\' 3"  (1.6 m)   Wt 262 lb 4.8 oz (119 kg)   SpO2 98%   BMI 46.46 kg/m  Exam: General: sitting up in bed, taking gown off repeatedly throughout encounter, attempting to pull off EKG pads and pulse oximeter, though not agitated Eyes: EOMI, PERRLA ENTM: MMM, no oropharyngeal erythema or exudates Neck: supple Cardiovascular: RRR, no murmurs appreciated Respiratory: SOB with movement, exam difficult due to patient's habitus but no wheezing and CTAB Gastrointestinal: obese, non-tender, soft, +BS MSK: patient unable to cooperate with exam Derm: warm and dry Neuro: oriented to person only, no gross neuro deficits  Labs and Imaging: CBC BMET   Recent Labs Lab 01/13/17 1200  WBC 13.5*  HGB 12.7  HCT 37.5  PLT 244    Recent Labs Lab 01/13/17 1200  NA 136  K 4.2  CL 104  CO2 20*  BUN 7  CREATININE 1.22*  GLUCOSE 311*  CALCIUM 9.5     Dg Chest 2 View  Result Date: 01/13/2017 CLINICAL DATA:  Altered mental status EXAM: CHEST  2 VIEW COMPARISON:  08/01/2010 FINDINGS: There is no focal consolidation. There are low lung volumes. There is bilateral interstitial prominence and prominence of the central pulmonary vasculature likely accentuated by low lung volumes. There is no pleural effusion or pneumothorax. There is stable cardiomegaly. The osseous structures are unremarkable. IMPRESSION: Cardiomegaly.  No evidence of CHF. Electronically Signed   By: Kathreen Devoid   On: 01/13/2017 11:55   Ct Head Wo Contrast  Result Date:  01/13/2017 CLINICAL DATA:  Seizure activity. EXAM: CT HEAD WITHOUT CONTRAST TECHNIQUE: Contiguous axial images were obtained from the base of the skull through the vertex without intravenous contrast. COMPARISON:  MRI head 02/21/2015.  Head CT 08/01/2014. FINDINGS: Brain: Hypo attenuation in the posterior right frontal lobe is probably encephalomalacia related to prior infarct although there is some preservation of  the overlying cortex. No evidence for acute hemorrhage, hydrocephalus, or abnormal extra-axial fluid collection. Diffuse loss of parenchymal volume is consistent with atrophy. Patchy low attenuation in the deep hemispheric and periventricular white matter is nonspecific, but likely reflects chronic microvascular ischemic demyelination. Vascular: No hyperdense vessel or unexpected calcification. Skull: No evidence for fracture. No worrisome lytic or sclerotic lesion. Sinuses/Orbits: The visualized paranasal sinuses and mastoid air cells are clear. Visualized portions of the globes and intraorbital fat are unremarkable. Other: None. IMPRESSION: 1. Focal hypo attenuation posterior right frontal lobe is new since the 2016 exams and probably related to interval nonacute infarct given subtle ex vacuo dilatation right frontal horn. There is some preservation of the overlying cortex and although vasogenic edema is considered less likely, MRI of the brain is suggested for more definitive characterization. 2. Diffuse atrophy with chronic small vessel white matter ischemic disease. 3. Electronically Signed   By: Misty Stanley M.D.   On: 01/13/2017 12:42   Ct Angio Chest Pe W And/or Wo Contrast  Result Date: 01/13/2017 CLINICAL DATA:  Seizures, shortness of breath, lethargy and confusion. History of sickle cell disease. EXAM: CT ANGIOGRAPHY CHEST WITH CONTRAST TECHNIQUE: Multidetector CT imaging of the chest was performed using the standard protocol during bolus administration of intravenous contrast. Multiplanar CT image reconstructions and MIPs were obtained to evaluate the vascular anatomy. CONTRAST:  100 cc Omnipaque 370 COMPARISON:  None. FINDINGS: Cardiovascular: The heart is mildly enlarged. No pericardial effusion. The aorta is normal in caliber. Mild tortuosity and moderate atherosclerotic calcifications. Coronary artery calcifications are noted. The pulmonary arterial tree is fairly well opacified. There is  moderate breathing motion artifact but no definite large central pulmonary emboli. Mediastinum/Nodes: Small scattered mediastinal and hilar lymph nodes but no mass or overt adenopathy. The esophagus is grossly normal. Lungs/Pleura: Moderate breathing motion artifact. No definite acute pulmonary findings such as infiltrate, edema or effusion. No worrisome pulmonary lesions. Upper Abdomen: Small hiatal hernia noted. Partially calcified spleen. Status post cholecystectomy. Large right renal cyst. Moderate aortic calcifications. Chest wall/ Musculoskeletal: No breast masses, supraclavicular or axillary lymphadenopathy. There is massive left-sided thyromegaly also demonstrated on prior thyroid ultrasound examinations. Moderate deviation of the trachea rightward. No significant bony findings. Review of the MIP images confirms the above findings. IMPRESSION: 1. Examination is somewhat limited by breathing motion artifact and body habitus. 2. No definite CT findings for pulmonary embolism. 3. Normal caliber thoracic aorta. Moderate atherosclerotic calcifications. Three-vessel coronary artery calcifications. 4. No significant pulmonary findings. 5. No significant upper abdominal findings. Aortic Atherosclerosis (ICD10-I70.0). Electronically Signed   By: Marijo Sanes M.D.   On: 01/13/2017 15:24    Verner Mould, MD 01/13/2017, 8:49 PM PGY-3, [place] [facility] pager: [phone], text pages welcome

## 2017-01-13 NOTE — ED Notes (Signed)
Report attempted 

## 2017-01-13 NOTE — ED Notes (Signed)
Seizure pads placed on bed. Pt taken to xray and then to CT.

## 2017-01-13 NOTE — ED Notes (Signed)
Pt has returned from CT.  

## 2017-01-13 NOTE — Progress Notes (Signed)
Patient arrived to floor via ED staff, family at bedside. Oriented x1. Vitals stable, questions answered. Oxygen/suction set up, bed pads applied.  Continue to monitor patient.

## 2017-01-13 NOTE — ED Notes (Signed)
Pt was not able to provide a urine sample while in the ED

## 2017-01-13 NOTE — ED Provider Notes (Signed)
[facility] DEPT Provider Note   CSN: [id number] Arrival date & time: 01/13/17  1035     History   Chief Complaint Chief Complaint  Patient presents with  . Altered Mental Status    HPI KETZIA GUZEK is a 76 y.o. female presenting via EMS After a witnessed episode of jerking movement seizure-like activity per daughter in the room. She reports that she was more confused afterwards and agitated. Both daughters explained that she has been worsening over the last 2 weeks and have been increasingly confused. Patient does not have a history of seizures but has been started on new medications including Cymbalta and seroquel. Daughter also reports that this may have started even more than 2 weeks ago. There has been concern for dementia. Patient typically is alert and oriented 4 and able to have a conversation. They also report that she fell getting out of her bed on Sunday, no head trauma or loss of consciousness. EMS came and evaluated her but she did not get transported. No anticoagulant use. No loss of bowel or bladder function.  HPI  Past Medical History:  Diagnosis Date  . Anxiety   . Arthritis    "all over"  . Asthma   . Bipolar disorder ([place])   . Complication of anesthesia    "w/right foot OR they gave me too much and they couldn't get me woke"  . Congestive heart failure, unspecified   . Coronary artery disease    "I've got 1 stent" (06/30/2015)  . Depression   . Discoid lupus   . Fibromyalgia    "I've been told I have this; can't take Lyrica cause I'm allergic to it" (06/30/2015)  . GERD (gastroesophageal reflux disease)   . History of gout   . History of hiatal hernia    "real bad" (06/30/2015)  . Hypertension   . Hypothyroidism   . On home oxygen therapy    "2L q hs" (06/30/2015)  . OSA (obstructive sleep apnea)    "just use my oxygen; no mask" (06/30/2015)  . Other and unspecified hyperlipidemia   . Penetrating foot wound    left nonhealing foot wound on the  dorsal surface  . Peripheral neuropathy   . Pneumonia "many of times"  . Refusal of blood transfusions as patient is Jehovah's Witness   . Renal failure, unspecified    "kidneys not working 100%" (06/30/2015)  . Sickle cell trait ([place])   . Systemic lupus ([place])   . Type II diabetes mellitus ([place])   . Unspecified vitamin D deficiency     Patient Active Problem List   Diagnosis Date Noted  . Altered mental status 01/13/2017  . Mastalgia 04/20/2016  . Bilateral leg edema 02/10/2016  . Hearing difficulty 10/30/2015  . Midline low back pain with right-sided sciatica 09/25/2015  . Angina pectoris ([place]) 06/30/2015  . Dysarthria 02/14/2015  . Sinusitis, chronic 10/25/2014  . Schizophrenia, unspecified type ([place]) 10/25/2014  . Type 2 diabetes mellitus with complication ([place])   . Cephalalgia   . Hx of systemic lupus erythematosus (SLE)   . Thyroid mass 04/26/2014  . Unspecified constipation 12/10/2013  . Benign neoplasm of colon 12/10/2013  . Controlled type 2 diabetes with neuropathy ([place]) 11/27/2013  . Cystic kidney disease 11/09/2013  . Insulin dependent type 2 diabetes mellitus ([place]) 10/29/2013  . Hoarseness or changing voice 09/27/2013  . Dyskinesia, tardive 07/11/2013  . TMJ syndrome 02/16/2013  . Angina pectoris associated with type 2 diabetes mellitus ([place]) 08/08/2012  .  CAD in native artery 08/08/2012  . Asthma, chronic, PRESUMED wiht MULTIFACTORIAL DYSPNEA 05/30/2012  . Insomnia 09/22/2011  . Mobility poor 02/02/2011  . Chronic kidney disease, stage III (moderate) 05/13/2010  . Chronic combined systolic and diastolic CHF, NYHA class 2 ([place]) 03/31/2010  . COPD (chronic obstructive pulmonary disease) ([place]) 03/26/2010  . Morbid obesity ([place]) 02/27/2010  . DM (diabetes mellitus), type 2 with neurological complications ([place]) 76/81/1572  . Osteoarthritis 04/25/2009  . Anemia 03/11/2007  . COGNITIVE IMPAIRMENT, MILD, SO STATED 03/11/2007  . RESTLESS LEG SYNDROME 03/11/2007  .  LUPUS ERYTHEMATOSUS, DISCOID 03/11/2007  . Hypothyroidism 03/07/2007  . DYSLIPIDEMIA 03/07/2007  . DEPRESSION 03/07/2007  . Essential hypertension 03/07/2007  . Coronary atherosclerosis 03/07/2007  . GERD 03/07/2007  . SLEEP APNEA 03/07/2007    Past Surgical History:  Procedure Laterality Date  . ANKLE ARTHROSCOPY WITH OPEN REDUCTION INTERNAL FIXATION (ORIF) Right 03/2011  . CARDIAC CATHETERIZATION N/A 07/01/2015   Procedure: Left Heart Cath and Coronary Angiography;  Surgeon: Adrian Prows, MD;  Location: [facility];  Service: Cardiovascular;  Laterality: N/A;  . CARDIAC CATHETERIZATION N/A 07/01/2015   Procedure: Intravascular Pressure Wire/FFR Study;  Surgeon: Adrian Prows, MD;  Location: [facility];  Service: Cardiovascular;  Laterality: N/A;  . CARPAL TUNNEL RELEASE Bilateral   . CATARACT EXTRACTION W/ INTRAOCULAR LENS  IMPLANT, BILATERAL Bilateral   . CHOLECYSTECTOMY OPEN    . COLONOSCOPY N/A 12/10/2013   Procedure: COLONOSCOPY;  Surgeon: Inda Castle, MD;  Location: [facility];  Service: Endoscopy;  Laterality: N/A;  . FRACTURE SURGERY    . INCISION AND DRAINAGE ABSCESS Left    foot "under my little toe"  . KNEE LIGAMENT RECONSTRUCTION Right   . LEFT HEART CATHETERIZATION WITH CORONARY ANGIOGRAM N/A 08/08/2012   Procedure: LEFT HEART CATHETERIZATION WITH CORONARY ANGIOGRAM;  Surgeon: [name], MD;  Location: [facility] CATH LAB;  Service: Cardiovascular;  Laterality: N/A;  . NASAL SINUS SURGERY    . PERCUTANEOUS CORONARY STENT INTERVENTION (PCI-S) N/A 08/21/2012   Procedure: PERCUTANEOUS CORONARY STENT INTERVENTION (PCI-S);  Surgeon: [name], MD;  Location: [facility] C[facility] LAB;  Service: Cardiovascular;  Laterality: N/A;  . TUBAL LIGATION      OB History    No data available       Home Medications    Prior to Admission medications   Medication Sig Start Date End Date Taking? Authorizing Provider  albuterol (PROAIR HFA) 108 (90 Base) MCG/ACT inhaler inhale  2 puffs by mouth every 4 hours if needed USE ONLY IF YOU ARE WHEEZING 05/13/16  Yes Hensel, Jamal Collin, MD  amLODipine (NORVASC) 10 MG tablet Take 1 tablet (10 mg total) by mouth daily. 12/22/16  Yes Hensel, Jamal Collin, MD  Ascorbic Acid (VITAMIN C PO) Take 1 tablet by mouth daily.   Yes [provider]  aspirin EC 81 MG tablet Take 162 mg by mouth daily.    Yes [provider]  atorvastatin (LIPITOR) 20 MG tablet Take 1 tablet (20 mg total) by mouth daily. 05/13/16  Yes Hensel, Jamal Collin, MD  B Complex-Biotin-FA (B COMPLETE) TABS Take 1 tablet by mouth daily.   Yes [provider]  Cholecalciferol (VITAMIN D3) 5000 UNITS CAPS Take 5,000 Units by mouth daily.   Yes [provider]  DULoxetine (CYMBALTA) 30 MG capsule Take 1 capsule (30 mg total) by mouth daily. Patient taking differently: Take 60 mg by mouth daily.  12/23/16  Yes Archie Patten, MD  Flaxseed, Linseed, (  FLAXSEED OIL PO) Take 1 capsule by mouth daily.   Yes [provider]  fluticasone (FLOVENT HFA) 220 MCG/ACT inhaler inhale 1 puff by mouth twice a day 05/13/16  Yes Hensel, Jamal Collin, MD  furosemide (LASIX) 20 MG tablet Take 1 tablet (20 mg total) by mouth daily. 05/13/16  Yes Hensel, Jamal Collin, MD  gabapentin (NEURONTIN) 300 MG capsule Take 1 capsule (300 mg total) by mouth 2 (two) times daily. 08/30/16  Yes Hensel, Jamal Collin, MD  guaiFENesin (MUCINEX) 600 MG 12 hr tablet Take 1 tablet (600 mg total) by mouth 2 (two) times daily. 12/23/16  Yes Dorsey, Donette Larry, MD  Insulin NPH, Human,, Isophane, (HUMULIN N KWIKPEN) 100 UNIT/ML Kiwkpen Inject 30 Units into the skin every morning. 05/14/16  Yes Hensel, Jamal Collin, MD  Insulin Pen Needle (PEN NEEDLES 31GX5/16") 31G X 8 MM MISC Check Blood sugar three times per day. Dx insulin using E11.8 08/30/16  Yes Hensel, Jamal Collin, MD  levothyroxine (SYNTHROID, LEVOTHROID) 25 MCG tablet Take 25 mcg by mouth daily. 12/14/16  Yes [provider]    lisinopril (PRINIVIL,ZESTRIL) 10 MG tablet Take 10 mg by mouth daily. 07/04/15  Yes [provider]  metFORMIN (GLUCOPHAGE) 500 MG tablet Take 1 tablet (500 mg total) by mouth 2 (two) times daily with a meal. 05/19/16  Yes Hensel, Jamal Collin, MD  metoprolol succinate (TOPROL-XL) 50 MG 24 hr tablet TAKE ONE TABLET BY MOUTH ONCE DAILY WITH OR IMMEDIATELY FOLLOWING A MEAL 12/14/16  Yes Hensel, Jamal Collin, MD  mometasone (ELOCON) 0.1 % ointment Apply topically daily. Patient taking differently: Apply 1 application topically daily as needed (supplement).  05/19/16  Yes Hensel, Jamal Collin, MD  Multiple Vitamin (MULTIVITAMIN WITH MINERALS) TABS tablet Take 1 tablet by mouth daily.   Yes [provider]  nitroGLYCERIN (NITROSTAT) 0.4 MG SL tablet Place 1 tablet (0.4 mg total) under the tongue every 5 (five) minutes as needed for chest pain. 05/13/16  Yes Hensel, Jamal Collin, MD  pantoprazole (PROTONIX) 40 MG tablet Take 1 tablet (40 mg total) by mouth 2 (two) times daily. 05/13/16  Yes Hensel, Jamal Collin, MD  psyllium (METAMUCIL SMOOTH TEXTURE) 28 % packet Take 1 packet by mouth 2 (two) times daily. 05/13/16  Yes Hensel, Jamal Collin, MD  QUEtiapine (SEROQUEL) 25 MG tablet Take 2 tablets (50 mg total) by mouth at bedtime. 12/29/16  Yes Hensel, Jamal Collin, MD  traMADol (ULTRAM) 50 MG tablet TAKE ONE TABLET BY MOUTH THREE TIMES A DAY. 08/30/16  Yes Hensel, Jamal Collin, MD  traZODone (DESYREL) 50 MG tablet Take 1 tablet (50 mg total) by mouth at bedtime. 05/13/16  Yes Hensel, Jamal Collin, MD    Family History Family History  Problem Relation Age of Onset  . Heart disease Mother   . Diabetes Mother   . Clotting disorder Mother   . Pneumonia Father   . Rheum arthritis Father   . Diabetes Sister   . Diabetes Sister   . Asthma Brother   . Cancer Brother   . Kidney disease Brother   . Lupus Son   . Heart disease Son   . Heart disease Daughter   . Colon cancer Neg Hx     Social History Social History   Substance Use Topics  . Smoking status: Former Smoker    Packs/day: 3.00    Years: 40.00    Types: Cigarettes    Start date: 07/05/1960    Quit date: 07/05/1998  . Smokeless tobacco:  Never Used  . Alcohol use 0.0 oz/week     Comment: 06/30/2015 "used to drink a little alcohol on the weekends; not q weekend; nothing in years"     Allergies   Ace inhibitors; Bidil [isosorb dinitrate-hydralazine]; Buspar [buspirone]; Pregabalin; Ropinirole hydrochloride; Amantadines; and Penicillins   Review of Systems Review of Systems  Unable to perform ROS: Mental status change  Neurological: Positive for seizures.  Psychiatric/Behavioral: Positive for agitation and confusion.     Physical Exam Updated Vital Signs BP (!) 121/98   Pulse 95   Temp 98.4 F (36.9 C) (Oral)   Resp (!) 22   Ht 5\' 3"  (1.6 m)   Wt 123.4 kg (272 lb)   SpO2 100%   BMI 48.18 kg/m   Physical Exam  Constitutional: She appears well-developed and well-nourished. No distress.  Afebrile, non-toxic  HENT:  Head: Normocephalic and atraumatic.  Eyes: Conjunctivae are normal.  Neck: Normal range of motion. Neck supple.  Cardiovascular: Normal rate, regular rhythm and normal heart sounds.   No murmur heard. Pulmonary/Chest: Effort normal and breath sounds normal. No respiratory distress. She has no wheezes. She has no rales. She exhibits no tenderness.  Abdominal: Soft. She exhibits no distension and no mass. There is no tenderness. There is no rebound and no guarding.  Musculoskeletal: Normal range of motion. She exhibits no edema or tenderness.  Neurological: She is alert. GCS eye subscore is 4. GCS verbal subscore is 4. GCS motor subscore is 6.  Neurologic Exam:  - Mental status: Patient is alert. Patient is oriented x 1 to person. Follows command but inconsistant - Cranial nerves:  CN III, IV, VI: pupils round and reactive. CN VII : muscles of facial expression intact. CN X :  midline uvula.  5/5, XII: tongue is  midline when protruded. - Motor: No involuntary movements. Muscle tone and bulk normal throughout. Muscle strength is 5/5 in bilateral grip, ankle dorsiflexion and plantar flexion.  - Sensory: Proprioception, light tough sensation intact in all extremities.   Skin: Skin is warm and dry. No rash noted. She is not diaphoretic. No erythema.  Nursing note and vitals reviewed.     ED Treatments / Results  Labs (all labs ordered are listed, but only abnormal results are displayed) Labs Reviewed  CBC WITH DIFFERENTIAL/PLATELET - Abnormal; Notable for the following:       Result Value   WBC 13.5 (*)    RBC 5.28 (*)    MCV 71.0 (*)    MCH 24.1 (*)    Neutro Abs 12.0 (*)    All other components within normal limits  COMPREHENSIVE METABOLIC PANEL - Abnormal; Notable for the following:    CO2 20 (*)    Glucose, Bld 311 (*)    Creatinine, Ser 1.22 (*)    Total Protein 8.6 (*)    Alkaline Phosphatase 132 (*)    GFR calc non Af Amer 42 (*)    GFR calc Af Amer 48 (*)    All other components within normal limits  D-DIMER, QUANTITATIVE (NOT AT [facility] - Abnormal; Notable for the following:    D-Dimer, Quant 2.09 (*)    All other components within normal limits  URINALYSIS, ROUTINE W REFLEX MICROSCOPIC  BASIC METABOLIC PANEL  CBC  VITAMIN B12  RPR  RAPID URINE DRUG SCREEN, HOSP PERFORMED  CBG MONITORING, ED    EKG  EKG Interpretation  Date/Time:  Thursday January 13 2017 11:20:51 EDT Ventricular Rate:  115 PR Interval:  QRS Duration: 97 QT Interval:  330 QTC Calculation: 457 R Axis:   -37 Text Interpretation:  Sinus tachycardia Left axis deviation Low voltage, precordial leads No STEMI.  Confirmed by Nanda Quinton [phone]) on 01/13/2017 11:52:12 AM       Radiology Dg Chest 2 View  Result Date: 01/13/2017 CLINICAL DATA:  Altered mental status EXAM: CHEST  2 VIEW COMPARISON:  08/01/2010 FINDINGS: There is no focal consolidation. There are low lung volumes. There is bilateral  interstitial prominence and prominence of the central pulmonary vasculature likely accentuated by low lung volumes. There is no pleural effusion or pneumothorax. There is stable cardiomegaly. The osseous structures are unremarkable. IMPRESSION: Cardiomegaly.  No evidence of CHF. Electronically Signed   By: Kathreen Devoid   On: 01/13/2017 11:55   Ct Head Wo Contrast  Result Date: 01/13/2017 CLINICAL DATA:  Seizure activity. EXAM: CT HEAD WITHOUT CONTRAST TECHNIQUE: Contiguous axial images were obtained from the base of the skull through the vertex without intravenous contrast. COMPARISON:  MRI head 02/21/2015.  Head CT 08/01/2014. FINDINGS: Brain: Hypo attenuation in the posterior right frontal lobe is probably encephalomalacia related to prior infarct although there is some preservation of the overlying cortex. No evidence for acute hemorrhage, hydrocephalus, or abnormal extra-axial fluid collection. Diffuse loss of parenchymal volume is consistent with atrophy. Patchy low attenuation in the deep hemispheric and periventricular white matter is nonspecific, but likely reflects chronic microvascular ischemic demyelination. Vascular: No hyperdense vessel or unexpected calcification. Skull: No evidence for fracture. No worrisome lytic or sclerotic lesion. Sinuses/Orbits: The visualized paranasal sinuses and mastoid air cells are clear. Visualized portions of the globes and intraorbital fat are unremarkable. Other: None. IMPRESSION: 1. Focal hypo attenuation posterior right frontal lobe is new since the 2016 exams and probably related to interval nonacute infarct given subtle ex vacuo dilatation right frontal horn. There is some preservation of the overlying cortex and although vasogenic edema is considered less likely, MRI of the brain is suggested for more definitive characterization. 2. Diffuse atrophy with chronic small vessel white matter ischemic disease. 3. Electronically Signed   By: Misty Stanley M.D.   On:  01/13/2017 12:42   Ct Angio Chest Pe W And/or Wo Contrast  Result Date: 01/13/2017 CLINICAL DATA:  Seizures, shortness of breath, lethargy and confusion. History of sickle cell disease. EXAM: CT ANGIOGRAPHY CHEST WITH CONTRAST TECHNIQUE: Multidetector CT imaging of the chest was performed using the standard protocol during bolus administration of intravenous contrast. Multiplanar CT image reconstructions and MIPs were obtained to evaluate the vascular anatomy. CONTRAST:  100 cc Omnipaque 370 COMPARISON:  None. FINDINGS: Cardiovascular: The heart is mildly enlarged. No pericardial effusion. The aorta is normal in caliber. Mild tortuosity and moderate atherosclerotic calcifications. Coronary artery calcifications are noted. The pulmonary arterial tree is fairly well opacified. There is moderate breathing motion artifact but no definite large central pulmonary emboli. Mediastinum/Nodes: Small scattered mediastinal and hilar lymph nodes but no mass or overt adenopathy. The esophagus is grossly normal. Lungs/Pleura: Moderate breathing motion artifact. No definite acute pulmonary findings such as infiltrate, edema or effusion. No worrisome pulmonary lesions. Upper Abdomen: Small hiatal hernia noted. Partially calcified spleen. Status post cholecystectomy. Large right renal cyst. Moderate aortic calcifications. Chest wall/ Musculoskeletal: No breast masses, supraclavicular or axillary lymphadenopathy. There is massive left-sided thyromegaly also demonstrated on prior thyroid ultrasound examinations. Moderate deviation of the trachea rightward. No significant bony findings. Review of the MIP images confirms the above findings. IMPRESSION: 1. Examination is somewhat limited  by breathing motion artifact and body habitus. 2. No definite CT findings for pulmonary embolism. 3. Normal caliber thoracic aorta. Moderate atherosclerotic calcifications. Three-vessel coronary artery calcifications. 4. No significant pulmonary  findings. 5. No significant upper abdominal findings. Aortic Atherosclerosis (ICD10-I70.0). Electronically Signed   By: Marijo Sanes M.D.   On: 01/13/2017 15:24    Procedures Procedures (including critical care time)  Medications Ordered in ED Medications  mometasone (ELOCON) 0.1 % ointment 1 application (not administered)  fluticasone (FLOVENT HFA) 220 MCG/ACT inhaler 1 puff (not administered)  enoxaparin (LOVENOX) injection 30 mg (not administered)  0.9 %  sodium chloride infusion (not administered)  insulin aspart (novoLOG) injection 0-9 Units (not administered)  iopamidol (ISOVUE-370) 76 % injection (100 mLs  Contrast Given 01/13/17 1452)  LORazepam (ATIVAN) tablet 0.5 mg (0.5 mg Oral Given 01/13/17 1345)     Initial Impression / Assessment and Plan / ED Course  I have reviewed the triage vital signs and the nursing notes.  Pertinent labs & imaging results that were available during my care of the patient were reviewed by me and considered in my medical decision making (see chart for details).    Patient presents with progressing altered mental status over the last 2 weeks and witnessed seizure-like activity prior to arrival.  Patient has also been agitated while in ED. She denies any pain or symptoms. Alert and oriented to person only.  Tachycardia and decreased spo2 on arrival, positive dimer, CTa chest negative. New medications include cymbalta and seroquel which may be contributing.  CT head with concern for vasogenic edema, MRI recommended for further evaluation. Need to rule out possibility of a space occupying lesion as etiology of her symptoms.  Consulted for admission and spoke to admitting physician. Patient was discussed with Dr. Laverta Baltimore who has also seen patient.  Final Clinical Impressions(s) / ED Diagnoses   Final diagnoses:  Altered mental status, unspecified altered mental status type    New Prescriptions New Prescriptions   No medications on file       Dossie Der 01/13/17 Renaee Munda, MD 01/14/17 [phone]

## 2017-01-13 NOTE — ED Triage Notes (Addendum)
Pt brought in by EMS due to having seizure like activity without hx of seizures. Per EMS, seizure last approximately 2 minutes. Pt confused at this time. Per family, pt has been more lethargic and confused over past 2 weeks. Pt a&ox1.

## 2017-01-13 NOTE — ED Notes (Signed)
Pt transported to CT for CTA scan

## 2017-01-14 ENCOUNTER — Inpatient Hospital Stay (HOSPITAL_COMMUNITY): Payer: Medicare Other

## 2017-01-14 ENCOUNTER — Encounter (HOSPITAL_COMMUNITY): Payer: Self-pay | Admitting: *Deleted

## 2017-01-14 DIAGNOSIS — I2510 Atherosclerotic heart disease of native coronary artery without angina pectoris: Secondary | ICD-10-CM

## 2017-01-14 DIAGNOSIS — E1140 Type 2 diabetes mellitus with diabetic neuropathy, unspecified: Secondary | ICD-10-CM

## 2017-01-14 DIAGNOSIS — I639 Cerebral infarction, unspecified: Secondary | ICD-10-CM | POA: Diagnosis present

## 2017-01-14 DIAGNOSIS — R410 Disorientation, unspecified: Secondary | ICD-10-CM

## 2017-01-14 DIAGNOSIS — I700 Atherosclerosis of aorta: Secondary | ICD-10-CM

## 2017-01-14 DIAGNOSIS — F209 Schizophrenia, unspecified: Secondary | ICD-10-CM

## 2017-01-14 DIAGNOSIS — G40409 Other generalized epilepsy and epileptic syndromes, not intractable, without status epilepticus: Secondary | ICD-10-CM | POA: Diagnosis present

## 2017-01-14 HISTORY — DX: Atherosclerotic heart disease of native coronary artery without angina pectoris: I25.10

## 2017-01-14 HISTORY — DX: Disorientation, unspecified: R41.0

## 2017-01-14 HISTORY — DX: Atherosclerosis of aorta: I70.0

## 2017-01-14 LAB — RPR: RPR Ser Ql: NONREACTIVE

## 2017-01-14 LAB — BASIC METABOLIC PANEL
Anion gap: 14 (ref 5–15)
BUN: 9 mg/dL (ref 6–20)
CO2: 18 mmol/L — ABNORMAL LOW (ref 22–32)
Calcium: 9 mg/dL (ref 8.9–10.3)
Chloride: 107 mmol/L (ref 101–111)
Creatinine, Ser: 1.22 mg/dL — ABNORMAL HIGH (ref 0.44–1.00)
GFR calc Af Amer: 48 mL/min — ABNORMAL LOW (ref >60)
GFR calc non Af Amer: 42 mL/min — ABNORMAL LOW (ref >60)
Glucose, Bld: 226 mg/dL — ABNORMAL HIGH (ref 65–99)
Potassium: 4.2 mmol/L (ref 3.5–5.1)
Sodium: 139 mmol/L (ref 135–145)

## 2017-01-14 LAB — GLUCOSE, CAPILLARY
Glucose-Capillary: 207 mg/dL — ABNORMAL HIGH (ref 65–99)
Glucose-Capillary: 222 mg/dL — ABNORMAL HIGH (ref 65–99)
Glucose-Capillary: 117 mg/dL — ABNORMAL HIGH (ref 65–99)
Glucose-Capillary: 253 mg/dL — ABNORMAL HIGH (ref 65–99)

## 2017-01-14 LAB — CBC
HCT: 38.1 % (ref 36.0–46.0)
Hemoglobin: 13.2 g/dL (ref 12.0–15.0)
MCH: 24.5 pg — ABNORMAL LOW (ref 26.0–34.0)
MCHC: 34.6 g/dL (ref 30.0–36.0)
MCV: 70.7 fL — ABNORMAL LOW (ref 78.0–100.0)
Platelets: 219 10*3/uL (ref 150–400)
RBC: 5.39 MIL/uL — ABNORMAL HIGH (ref 3.87–5.11)
RDW: 14.6 % (ref 11.5–15.5)
WBC: 14 10*3/uL — ABNORMAL HIGH (ref 4.0–10.5)

## 2017-01-14 MED ORDER — LEVETIRACETAM 500 MG PO TABS
500.0000 mg | ORAL_TABLET | Freq: Two times a day (BID) | ORAL | Status: DC
  Administered 2017-01-14 – 2017-01-18 (×8): 500 mg via ORAL
  Filled 2017-01-14 (×8): qty 1

## 2017-01-14 MED ORDER — ENOXAPARIN SODIUM 60 MG/0.6ML ~~LOC~~ SOLN
60.0000 mg | SUBCUTANEOUS | Status: DC
  Administered 2017-01-14 – 2017-01-15 (×2): 60 mg via SUBCUTANEOUS
  Filled 2017-01-14 (×2): qty 0.6

## 2017-01-14 MED ORDER — FUROSEMIDE 20 MG PO TABS
20.0000 mg | ORAL_TABLET | Freq: Every day | ORAL | Status: DC
  Administered 2017-01-14 – 2017-01-18 (×5): 20 mg via ORAL
  Filled 2017-01-14 (×5): qty 1

## 2017-01-14 MED ORDER — GABAPENTIN 100 MG PO CAPS
100.0000 mg | ORAL_CAPSULE | Freq: Two times a day (BID) | ORAL | Status: DC
  Administered 2017-01-14 – 2017-01-18 (×9): 100 mg via ORAL
  Filled 2017-01-14 (×9): qty 1

## 2017-01-14 MED ORDER — LEVOTHYROXINE SODIUM 25 MCG PO TABS
25.0000 ug | ORAL_TABLET | Freq: Every day | ORAL | Status: DC
  Administered 2017-01-15 – 2017-01-18 (×4): 25 ug via ORAL
  Filled 2017-01-14 (×4): qty 1

## 2017-01-14 MED ORDER — INSULIN NPH (HUMAN) (ISOPHANE) 100 UNIT/ML ~~LOC~~ SUSP
15.0000 [IU] | Freq: Every day | SUBCUTANEOUS | Status: DC
  Administered 2017-01-15 – 2017-01-18 (×4): 15 [IU] via SUBCUTANEOUS
  Filled 2017-01-14: qty 10

## 2017-01-14 MED ORDER — ATORVASTATIN CALCIUM 10 MG PO TABS
20.0000 mg | ORAL_TABLET | Freq: Every day | ORAL | Status: DC
  Administered 2017-01-15 – 2017-01-17 (×3): 20 mg via ORAL
  Filled 2017-01-14 (×5): qty 2

## 2017-01-14 MED ORDER — PANTOPRAZOLE SODIUM 40 MG PO TBEC
40.0000 mg | DELAYED_RELEASE_TABLET | Freq: Two times a day (BID) | ORAL | Status: DC
  Administered 2017-01-14 – 2017-01-18 (×8): 40 mg via ORAL
  Filled 2017-01-14 (×8): qty 1

## 2017-01-14 MED ORDER — LORAZEPAM 2 MG/ML IJ SOLN
0.5000 mg | Freq: Once | INTRAMUSCULAR | Status: AC
  Administered 2017-01-14: 0.5 mg via INTRAVENOUS
  Filled 2017-01-14: qty 1

## 2017-01-14 MED ORDER — LORAZEPAM 2 MG/ML IJ SOLN: 1.0000 mg | INTRAMUSCULAR | Status: DC | PRN

## 2017-01-14 NOTE — Progress Notes (Signed)
Social visit by PCP.  Spoke with daughter.  Brittany Roberts was sleeping.  Appreciate great care of team.  I am glad we are getting MRI and Neuro consult.  Both the seizure and the presumed old stroke seen on CT are new, unexpected findings.  I would like to clearly define and revisit her medicine regimen prior to DC.  Appreciate great care of team.

## 2017-01-14 NOTE — Evaluation (Signed)
Physical Therapy Evaluation Patient Details Name: Brittany Roberts MRN: [id number] DOB: [birth year] Today's Date: 01/14/2017   History of Present Illness  Pt is a 76 y/o female admitted secondary to likely tonic-clonic seizure activity. PMH including but not limited to COPD, CHF, CAD, CKD, DM, HLD, HTN, SLE and schizophrenia.  Clinical Impression  Pt presented supine in bed with HOB elevated, awake and willing to participate in therapy session. Prior to admission, pt reported that she uses a rollator to ambulate and required assistance with ADLs. Pt's daughter stated that she has a CNA that comes everyday for a couple of hours to assist her. Pt ambulated a short distance within her room with min A and use of RW. She currently requires two person physical assist for bed mobility and min A x2 for transfers for safety and stability. PT recommending pt d/c to SNF for ST rehab services prior to returning home as pt lives alone. Pt would continue to benefit from skilled physical therapy services at this time while admitted and after d/c to address the below listed limitations in order to improve overall safety and independence with functional mobility.     Follow Up Recommendations SNF    Equipment Recommendations  None recommended by PT    Recommendations for Other Services       Precautions / Restrictions Precautions Precautions: Fall;Other (comment) (seizure precautions) Restrictions Weight Bearing Restrictions: No      Mobility  Bed Mobility Overal bed mobility: Needs Assistance Bed Mobility: Supine to Sit;Sit to Supine     Supine to sit: Mod assist;+2 for physical assistance Sit to supine: Min guard   General bed mobility comments: increased time, cueing for sequencing, use of bed rails, mod A x2 to achieve sitting EOB, min guard for safety with return to supine  Transfers Overall transfer level: Needs assistance Equipment used: Rolling walker (2 wheeled) Transfers: Sit to/from  Stand Sit to Stand: Min assist;+2 safety/equipment         General transfer comment: increased time, cueing for technique, min A x2 to rise from EOB and for stability with transition  Ambulation/Gait Ambulation/Gait assistance: Min assist Ambulation Distance (Feet): 20 Feet Assistive device: Rolling walker (2 wheeled) Gait Pattern/deviations: Step-through pattern;Decreased stride length Gait velocity: decreased Gait velocity interpretation: Below normal speed for age/gender General Gait Details: modest instability requiring constant min A for balance and safety with use of RW as pt was a bit impulsive   Stairs            Wheelchair Mobility    Modified Rankin (Stroke Patients Only)       Balance Overall balance assessment: Needs assistance Sitting-balance support: Feet supported Sitting balance-Leahy Scale: Fair Sitting balance - Comments: close min guard to sit EOB   Standing balance support: During functional activity;Bilateral upper extremity supported Standing balance-Leahy Scale: Poor Standing balance comment: reliant on bilateral UEs on RW and min A for stability                             Pertinent Vitals/Pain Pain Assessment: No/denies pain    Home Living Family/patient expects to be discharged to:: Private residence Living Arrangements: Alone Available Help at Discharge: Family;Personal care attendant;Other (Comment) (has a CNA 2 hrs/day) Type of Home: Apartment Home Access: Level entry     Home Layout: One level Home Equipment: Walker - 2 wheels;Toilet riser;Wheelchair - manual;Shower seat      Prior Function Level of Independence:  Needs assistance   Gait / Transfers Assistance Needed: pt ambulates with use of RW  ADL's / Homemaking Assistance Needed: pt requires assistance for ADLs        Hand Dominance        Extremity/Trunk Assessment   Upper Extremity Assessment Upper Extremity Assessment: Defer to OT evaluation     Lower Extremity Assessment Lower Extremity Assessment: Generalized weakness       Communication   Communication: No difficulties  Cognition Arousal/Alertness: Awake/alert Behavior During Therapy: Impulsive Overall Cognitive Status: Impaired/Different from baseline Area of Impairment: Orientation;Memory;Following commands;Safety/judgement;Problem solving                 Orientation Level: Disoriented to;Place;Time;Situation   Memory: Decreased recall of precautions;Decreased short-term memory Following Commands: Follows one step commands inconsistently;Follows one step commands with increased time Safety/Judgement: Decreased awareness of safety;Decreased awareness of deficits   Problem Solving: Slow processing;Decreased initiation;Difficulty sequencing;Requires verbal cues;Requires tactile cues        General Comments      Exercises     Assessment/Plan    PT Assessment Patient needs continued PT services  PT Problem List Decreased strength;Decreased activity tolerance;Decreased balance;Decreased mobility;Decreased coordination;Decreased cognition;Decreased knowledge of use of DME;Decreased safety awareness;Decreased knowledge of precautions       PT Treatment Interventions DME instruction;Gait training;Stair training;Functional mobility training;Therapeutic activities;Therapeutic exercise;Balance training;Neuromuscular re-education;Cognitive remediation;Patient/family education    PT Goals (Current goals can be found in the Care Plan section)  Acute Rehab PT Goals Patient Stated Goal: to get stronger PT Goal Formulation: With patient/family Time For Goal Achievement: 01/28/17 Potential to Achieve Goals: Good    Frequency Min 3X/week   Barriers to discharge Decreased caregiver support      Co-evaluation               AM-PAC PT "6 Clicks" Daily Activity  Outcome Measure Difficulty turning over in bed (including adjusting bedclothes, sheets and  blankets)?: Total Difficulty moving from lying on back to sitting on the side of the bed? : Total Difficulty sitting down on and standing up from a chair with arms (e.g., wheelchair, bedside commode, etc,.)?: Total Help needed moving to and from a bed to chair (including a wheelchair)?: A Little Help needed walking in hospital room?: A Little Help needed climbing 3-5 steps with a railing? : A Lot 6 Click Score: 11    End of Session Equipment Utilized During Treatment: Gait belt Activity Tolerance: Patient tolerated treatment well Patient left: in bed;with call bell/phone within reach;with bed alarm set;with family/visitor present Nurse Communication: Mobility status PT Visit Diagnosis: Unsteadiness on feet (R26.81);Other abnormalities of gait and mobility (R26.89)    Time: 0174-9449 PT Time Calculation (min) (ACUTE ONLY): 24 min   Charges:   PT Evaluation $PT Eval Moderate Complexity: 1 Procedure PT Treatments $Therapeutic Activity: 8-22 mins   PT G Codes:        [place], PT, DPT [place] 01/14/2017, 4:22 PM

## 2017-01-14 NOTE — Progress Notes (Signed)
Pt unable to hold still due to confusion, paged MD.  He wants to wait until tomorrow to try again.

## 2017-01-14 NOTE — Evaluation (Signed)
Clinical/Bedside Swallow Evaluation Patient Details  Name: Brittany Roberts MRN: [id number] Date of Birth: 1940/12/03  Today's Date: 01/14/2017 Time: SLP Start Time (ACUTE ONLY): 73 SLP Stop Time (ACUTE ONLY): 1100 SLP Time Calculation (min) (ACUTE ONLY): 20 min  Past Medical History:  Past Medical History:  Diagnosis Date  . Acute delirium 01/14/2017  . Anxiety   . Arthritis    "all over"  . Asthma   . Bipolar disorder ([place])   . Complication of anesthesia    "w/right foot OR they gave me too much and they couldn't get me woke"  . Congestive heart failure, unspecified   . Coronary artery calcification seen on CAT scan 01/14/2017  . Coronary artery disease    "I've got 1 stent" (06/30/2015)  . Depression   . Discoid lupus   . Fibromyalgia    "I've been told I have this; can't take Lyrica cause I'm allergic to it" (06/30/2015)  . GERD (gastroesophageal reflux disease)   . History of gout   . History of hiatal hernia    "real bad" (06/30/2015)  . Hypertension   . Hypothyroidism   . On home oxygen therapy    "2L q hs" (06/30/2015)  . OSA (obstructive sleep apnea)    "just use my oxygen; no mask" (06/30/2015)  . Other and unspecified hyperlipidemia   . Penetrating foot wound    left nonhealing foot wound on the dorsal surface  . Peripheral neuropathy   . Pneumonia "many of times"  . Refusal of blood transfusions as patient is Jehovah's Witness   . Renal failure, unspecified    "kidneys not working 100%" (06/30/2015)  . Sickle cell trait ([place])   . Systemic lupus ([place])   . Thoracic aortic atherosclerosis ([place]) 01/14/2017  . Type II diabetes mellitus ([place])   . Unspecified vitamin D deficiency    Past Surgical History:  Past Surgical History:  Procedure Laterality Date  . ANKLE ARTHROSCOPY WITH OPEN REDUCTION INTERNAL FIXATION (ORIF) Right 03/2011  . CARDIAC CATHETERIZATION N/A 07/01/2015   Procedure: Left Heart Cath and Coronary Angiography;  Surgeon: Adrian Prows, MD;  Location:  [facility];  Service: Cardiovascular;  Laterality: N/A;  . CARDIAC CATHETERIZATION N/A 07/01/2015   Procedure: Intravascular Pressure Wire/FFR Study;  Surgeon: Adrian Prows, MD;  Location: [facility];  Service: Cardiovascular;  Laterality: N/A;  . CARPAL TUNNEL RELEASE Bilateral   . CATARACT EXTRACTION W/ INTRAOCULAR LENS  IMPLANT, BILATERAL Bilateral   . CHOLECYSTECTOMY OPEN    . COLONOSCOPY N/A 12/10/2013   Procedure: COLONOSCOPY;  Surgeon: Inda Castle, MD;  Location: [facility];  Service: Endoscopy;  Laterality: N/A;  . FRACTURE SURGERY    . INCISION AND DRAINAGE ABSCESS Left    foot "under my little toe"  . KNEE LIGAMENT RECONSTRUCTION Right   . LEFT HEART CATHETERIZATION WITH CORONARY ANGIOGRAM N/A 08/08/2012   Procedure: LEFT HEART CATHETERIZATION WITH CORONARY ANGIOGRAM;  Surgeon: [name], MD;  Location: [facility] CATH LAB;  Service: Cardiovascular;  Laterality: N/A;  . NASAL SINUS SURGERY    . PERCUTANEOUS CORONARY STENT INTERVENTION (PCI-S) N/A 08/21/2012   Procedure: PERCUTANEOUS CORONARY STENT INTERVENTION (PCI-S);  Surgeon: [name], MD;  Location: [facility] CATH LAB;  Service: Cardiovascular;  Laterality: N/A;  . TUBAL LIGATION     HPI:  Brittany Roberts is a 76 y.o. female presenting with seizure-like activity and AMS. PMH is significant for COPD, schizophrenia, HTN, Type II DM, HLD, GERD, depression, insomnia, hypothyroidism, SLE,  chronic pain, hx TIA. Head Ct with right frontal lesion with ex vacuo changes new from 2016 c/w old CVA. MD notes possible vascular or mixed dementia, paranoid thoughts may be related to behavioral and psychological symptoms of dementia or preceding major psychiatric disorder.   Assessment / Plan / Recommendation Clinical Impression  Patient presents with mild-moderate risk for aspiration given cognitive impairment, history of GERD, history of dysphagia, previous pneumonia. She denies history of dysphagia, however when prompted she does  recall brief course of SLP OP therapy for dysarthria, dysphagia. She states she coughs "every once in a while when eating or drinking," does have a history of pneumonia but pt/daughter unable to recall most recent occasion. She is intermittently compliant with assessment, appears cognitively able to follow oral motor commands but declines to participate in this portion of the assessment. Limited PO trials observed; however pt passes 3 oz water swallow challenge with no overt signs of aspiration. Pt declined all solids presented with the exception of single bite of puree. MD began dys 3 diet with thin liquids this morning; agree with this recommendation as pt is edentulous but does not wear her dentures. Given limited PO trials observed this evaluation and prior history of dysphagia, SLP will follow briefly for tolerance. SLP Visit Diagnosis: Dysphagia, unspecified (R13.10)    Aspiration Risk  Mild aspiration risk    Diet Recommendation Dysphagia 3 (Mech soft);Thin liquid   Liquid Administration via: Cup;Straw Medication Administration: Whole meds with liquid Supervision: Patient able to self feed Compensations: Slow rate;Small sips/bites    Other  Recommendations Oral Care Recommendations: Oral care BID   Follow up Recommendations Other (comment) (TBD)      Frequency and Duration min 1 x/week  1 week       Prognosis Prognosis for Safe Diet Advancement: Good Barriers to Reach Goals: Cognitive deficits      Swallow Study   General Date of Onset: 01/13/17 HPI: Brittany Roberts is a 76 y.o. female presenting with seizure-like activity and AMS. PMH is significant for COPD, schizophrenia, HTN, Type II DM, HLD, GERD, depression, insomnia, hypothyroidism, SLE, chronic pain, hx TIA. Head Ct with right frontal lesion with ex vacuo changes new from 2016 c/w old CVA. MD notes possible vascular or mixed dementia, paranoid thoughts may be related to behavioral and psychological symptoms of dementia or  preceding major psychiatric disorder. Type of Study: Bedside Swallow Evaluation Previous Swallow Assessment: completed brief course of OP SLP tx for dysarthria, ? swallowing deficits associated with tardive dyskinesia in 2016. SLP recommended MBS however pt did not pursue. Diet Prior to this Study: Dysphagia 3 (soft);Thin liquids Temperature Spikes Noted: No Respiratory Status: Room air History of Recent Intubation: No Behavior/Cognition: Alert;Cooperative Oral Care Completed by SLP: No Oral Cavity - Dentition: Dentures, not available;Edentulous Vision: Functional for self-feeding Self-Feeding Abilities: Able to feed self Patient Positioning: Upright in bed Baseline Vocal Quality: Normal;Hoarse Volitional Cough: Cognitively unable to elicit Volitional Swallow: Able to elicit    Oral/Motor/Sensory Function Overall Oral Motor/Sensory Function: Other (comment) (no tardive dyskinesia noted, pt refused to follow commands )   Ice Chips Ice chips: Within functional limits   Thin Liquid Thin Liquid: Within functional limits Presentation: Cup;Straw;Self Fed    Nectar Thick Nectar Thick Liquid: Not tested   Honey Thick Honey Thick Liquid: Not tested   Puree Puree: Within functional limits Presentation: [place], [place], CCC-SLP Speech-Language Pathologist [phone] Solid: Not tested Other Comments:  patient refused        Aliene Altes 01/14/2017,11:43 AM

## 2017-01-14 NOTE — Progress Notes (Signed)
Family Medicine Teaching Service Daily Progress Note Intern Pager: [phone]  Patient name: Brittany Roberts Medical record number: [id number] Date of birth: [birth year] Age: 76 y.o. Gender: female  Primary Care Provider: Zenia Resides, MD Consultants: None Code Status: DNR   Pt Overview and Major Events to Date:  Brittany Roberts is a 76 y.o. Female presenting with seizure like activity and AMS. Patient was admitted to [facility] on 01/13/17.   Assessment and Plan: Brittany Roberts is a 76 y.o. female presenting with seizure-like activity and AMS. PMH is significant for COPD, schizophrenia, HTN, Type II DM, HLD, GERD, depression, insomnia, hypothyroidism, SLE, chronic pain, hx TIA.   Altered mental status:  Has been slowly improving since admission. Possibly due to medication, dementia, worsening schizophrenia, or post-ictal state. Patient had recent medication change (addition of Seroquel two weeks ago and decrease of dose of Cymbalta), and is also taking multiple other medications that could affect mental status (gabapentin, trazodone, tramadol). Patient has been managing medications herself and family unsure if she is taking properly. Underlying dementia may be contributing however would not expect such an abrupt change in mental status. Patient does have history of schizophrenia which may be etiology or contributing factor, and recently has reported auditory hallucinations. Possible post-ictal state due to seizure-like activity prior to AMS. On admission WBC of 13.5, increasing to 14.0 today. Infectious etiology less likely since patient remains afebrile with current temperature of 98.2. CXR on 01/13/17 was normal. CT of head on 7/12 showing no acute findings. UDS and UA pending. B12 levels wnl. Patient today is oriented to person, but not to place or time. Daughter states she does not show slurring speech but has trouble with word finding, such as stating "I smell the pen". Have contacted neurology and recommended MRI  and EEG. Neurology will consult.  - Seizure precautions - Neuro checks q2 - Holding home meds that may affect mental status (Seroquel, Cymbalta, trazodone, tramadol) - restart gabapentin at low dose, 100 mg bid - RPR pending - UA pending - UDS pending - MRI and EEG pending  Seizure-like activity:  Has improved. Patient reported to have twisting and jerking of head, mouth, and extremities with bladder incontinence and drooling for about 5 minutes on day of admission. Subsequent period of unresponsiveness for 15 minutes. Does have history of tardive dyskinesia per chart review, though family unaware of this, and symptoms not typical of TD. Head CT on 7/12 shows no acute findings. Patient and daughter deny any recent seizure like activity. Patient has not had any problems with incontinence today as well. Have contacted neurology and recommended MRI and EEG. Neurology will consult.  - Seizure precautions - Neuro check q2 - Consider neuro consult in AM - UDS pending - MRI and EEG pending   Schizophrenia:  Family unaware of diagnosis and patient unable to provide information. Started on Seroquel 25mg  qhs on 12/23/16 due to reports of auditory hallucinations. Patient denies hallucinations this morning.  - Discontinue Seroquel  - Consider sitter if patient becomes agitated - Dr. [name], PCP, has been made aware of admission and has spoken to family today   COPD:  Currently stable. Former smoking, but not current tobacco use. On Flovent at home with albuterol PRN. D-dimer elevated at 2.09. CTA on 7/12 showing no pulmonary embolism or acute findings. Current RR 20 with O2 saturation of 99% on RA. Lungs were clear to auscultation bilaterally. Patient denies SOB.  - Continue home Flovent - Albuterol PRN wheezing - Continuous  pulse ox - Supplemental O2 PRN  CHF:  Currently stable. CXR with cardiomegaly but no evidence of CHF. On Lasix 20mg  at home. No signs of fluid overload on exam. No edema  noted. Lungs CTAB bilaterally. Denies SOB.  - Restart lasix 20 mg daily - IVF while NPO but closely monitor fluid status  CKD:  Cr 1.22 on admission. Current Cr remains the same at 1.22. Baseline of ~1.3.  - Closely monitor fluids   Type II DM with neuropathy:  Will continue to monitor. Takes NPH 30U every AM. No sliding scale. Also prescribed gabapentin for neuropathy. Last A1C 7.6 on 12/28/16. Current CBG 233.  - sensitive sliding scale insulin  - will restart NPH at half dose and increase as needed per morning CBG - Hold gabapentin while NPO; resume at decreased dose when cleared for PO - monitor CBG  Depression - Hold home Cymbalta   HTN:  Current BP 144/78. Prescribed amlodipine, lisinopril, and metoprolol at home.  - Hold home BP meds until neurology has consulted  - appreciate neurology recommendations for permissive HTN  GERD - continue home protonix   Hypothyroidism:  On Synthroid. Last TSH 0.36 on 09/08/16.  - continue home synthroid   Insomnia - Hold home trazodone  Chronic pain:  Secondary to SLE per family report.  - Hold home tramadol  HLD:  Last lipid panel wnl on 07/2014.  - continue atorvastatin 20 mg   FEN/GI: NPO, protonix  PPx: Lovenox   Disposition: improving   Subjective:  Brittany Roberts is a 75 y.o. female presenting with seizure-like activity and AMS. PMH is significant for COPD, schizophrenia, HTN, Type II DM, HLD, GERD, depression, insomnia, hypothyroidism, SLE, chronic pain, hx TIA. Today patient reports no seizure like activity. Patient's daughter states patient has no slurred speech but has trouble with word finding, states uses the wrong words such as "I smell the pen". Patient denies HA, dizziness, vision changes, SOB, CP, or urinary symptoms of pain, burning, or frequency.   Objective: Temp:  [98.2 F (36.8 C)-99.7 F (37.6 C)] 99.1 F (37.3 C) (07/13 0914) Pulse Rate:  [54-106] 104 (07/13 0914) Resp:  [19-28] 19 (07/13  0914) BP: (121-172)/(67-115) 139/67 (07/13 0914) SpO2:  [91 %-100 %] 99 % (07/13 0914) Weight:  [262 lb 4.8 oz (119 kg)] 262 lb 4.8 oz (119 kg) (07/12 2007) Physical Exam: General: Patient is well appearing, in NAD. Patient is oriented to person but not time or place. Patient is easily distracted during exam  Cardiovascular: RRR, no MRG Respiratory: CTAB, no wheezing, rales, or rhonchi  Abdomen: soft, tender in hypogastric region, bowel sounds heard in all 4 quadrants  Extremities: no edema, 5+ muscle strength bilaterally  Neuro: difficulty to obtain d/t patient's distractibility, CN 2-12 intact, 5+ muscle strength.   Laboratory:  Recent Labs Lab 01/13/17 1200 01/14/17 0404  WBC 13.5* 14.0*  HGB 12.7 13.2  HCT 37.5 38.1  PLT 244 219    Recent Labs Lab 01/13/17 1200 01/14/17 0404  NA 136 139  K 4.2 4.2  CL 104 107  CO2 20* 18*  BUN 7 9  CREATININE 1.22* 1.22*  CALCIUM 9.5 9.0  PROT 8.6*  --   BILITOT 0.8  --   ALKPHOS 132*  --   ALT 15  --   AST 32  --   GLUCOSE 311* 226*     Imaging/Diagnostic Tests: Dg Chest 2 View  Result Date: 01/13/2017 CLINICAL DATA:  Altered mental status EXAM: CHEST  2  VIEW COMPARISON:  08/01/2010 FINDINGS: There is no focal consolidation. There are low lung volumes. There is bilateral interstitial prominence and prominence of the central pulmonary vasculature likely accentuated by low lung volumes. There is no pleural effusion or pneumothorax. There is stable cardiomegaly. The osseous structures are unremarkable. IMPRESSION: Cardiomegaly.  No evidence of CHF. Electronically Signed   By: Kathreen Devoid   On: 01/13/2017 11:55   Ct Head Wo Contrast  Result Date: 01/13/2017 CLINICAL DATA:  Seizure activity. EXAM: CT HEAD WITHOUT CONTRAST TECHNIQUE: Contiguous axial images were obtained from the base of the skull through the vertex without intravenous contrast. COMPARISON:  MRI head 02/21/2015.  Head CT 08/01/2014. FINDINGS: Brain: Hypo attenuation  in the posterior right frontal lobe is probably encephalomalacia related to prior infarct although there is some preservation of the overlying cortex. No evidence for acute hemorrhage, hydrocephalus, or abnormal extra-axial fluid collection. Diffuse loss of parenchymal volume is consistent with atrophy. Patchy low attenuation in the deep hemispheric and periventricular white matter is nonspecific, but likely reflects chronic microvascular ischemic demyelination. Vascular: No hyperdense vessel or unexpected calcification. Skull: No evidence for fracture. No worrisome lytic or sclerotic lesion. Sinuses/Orbits: The visualized paranasal sinuses and mastoid air cells are clear. Visualized portions of the globes and intraorbital fat are unremarkable. Other: None. IMPRESSION: 1. Focal hypo attenuation posterior right frontal lobe is new since the 2016 exams and probably related to interval nonacute infarct given subtle ex vacuo dilatation right frontal horn. There is some preservation of the overlying cortex and although vasogenic edema is considered less likely, MRI of the brain is suggested for more definitive characterization. 2. Diffuse atrophy with chronic small vessel white matter ischemic disease. 3. Electronically Signed   By: Misty Stanley M.D.   On: 01/13/2017 12:42   Ct Angio Chest Pe W And/or Wo Contrast  Result Date: 01/13/2017 CLINICAL DATA:  Seizures, shortness of breath, lethargy and confusion. History of sickle cell disease. EXAM: CT ANGIOGRAPHY CHEST WITH CONTRAST TECHNIQUE: Multidetector CT imaging of the chest was performed using the standard protocol during bolus administration of intravenous contrast. Multiplanar CT image reconstructions and MIPs were obtained to evaluate the vascular anatomy. CONTRAST:  100 cc Omnipaque 370 COMPARISON:  None. FINDINGS: Cardiovascular: The heart is mildly enlarged. No pericardial effusion. The aorta is normal in caliber. Mild tortuosity and moderate  atherosclerotic calcifications. Coronary artery calcifications are noted. The pulmonary arterial tree is fairly well opacified. There is moderate breathing motion artifact but no definite large central pulmonary emboli. Mediastinum/Nodes: Small scattered mediastinal and hilar lymph nodes but no mass or overt adenopathy. The esophagus is grossly normal. Lungs/Pleura: Moderate breathing motion artifact. No definite acute pulmonary findings such as infiltrate, edema or effusion. No worrisome pulmonary lesions. Upper Abdomen: Small hiatal hernia noted. Partially calcified spleen. Status post cholecystectomy. Large right renal cyst. Moderate aortic calcifications. Chest wall/ Musculoskeletal: No breast masses, supraclavicular or axillary lymphadenopathy. There is massive left-sided thyromegaly also demonstrated on prior thyroid ultrasound examinations. Moderate deviation of the trachea rightward. No significant bony findings. Review of the MIP images confirms the above findings. IMPRESSION: 1. Examination is somewhat limited by breathing motion artifact and body habitus. 2. No definite CT findings for pulmonary embolism. 3. Normal caliber thoracic aorta. Moderate atherosclerotic calcifications. Three-vessel coronary artery calcifications. 4. No significant pulmonary findings. 5. No significant upper abdominal findings. Aortic Atherosclerosis (ICD10-I70.0). Electronically Signed   By: Marijo Sanes M.D.   On: 01/13/2017 15:24     Caroline More, DO 01/14/2017, 2:48  PM PGY-1, [place] Intern pager: [phone], text pages welcome

## 2017-01-14 NOTE — Progress Notes (Signed)
Spoke to neurology regarding Ms. Forster. Recommended starting treatment for seizures. Recommended Keppra 500 mg bid po and prn ativan 1 mg for active seizures. Will need to deffer MRI and EEG for tomorrow since patient unable to stay still today   Caroline More, DO, PGY-1 [place] [facility] 01/14/2017 4:46 PM

## 2017-01-14 NOTE — Progress Notes (Addendum)
Inpatient Diabetes Program Recommendations  AACE/ADA: New Consensus Statement on Inpatient Glycemic Control (2015)  Target Ranges:  Prepandial:   less than 140 mg/dL      Peak postprandial:   less than 180 mg/dL (1-2 hours)      Critically ill patients:  140 - 180 mg/dL  Results for Brittany Roberts, Brittany Roberts (MRN [id number]) as of 01/14/2017 10:49  Ref. Range 01/13/2017 22:03 01/14/2017 07:08  Glucose-Capillary Latest Ref Range: 65 - 99 mg/dL 233 (H) 207 (H)    Review of Glycemic Control  Diabetes history: DM2 Outpatient Diabetes medications: NPH 30 units QAM, Metformin 500 mg BID Current orders for Inpatient glycemic control: Novolog 0-9 units TID with meals  Inpatient Diabetes Program Recommendations: Insulin - Basal: If glucose is consistently greater than 180 mg/dl with Novolog correction, please consider ordering Lantus 10 units Q24H. Insulin-Correction: Please consider adding Novolog 0-5 units QHS for bedtime correction.  Thanks, Barnie Alderman, RN, MSN, CDE Diabetes Coordinator Inpatient Diabetes Program [phone] (Team Pager from 8am to 5pm)

## 2017-01-14 NOTE — Progress Notes (Signed)
EEG deferred until AM. Pt uncooperative at this time, sitting up in bed, removing gown, confused, not following commands.  RN made aware.

## 2017-01-14 NOTE — Consult Note (Signed)
NEURO HOSPITALIST CONSULT NOTE   Requesting physician: Dr. McDiarmid  Reason for Consult: Seizure  History obtained from:  Patient's family and Chart   HPI:                                                                                                                                          Brittany Roberts is an 76 y.o. female with a considerable past medical history that includes fibromyalgia, discoid lupus, HTN, hyperlipidemia, CAD and T2DM presented to [facility] on 7/12 with AMS. Per patient's daughter, Brittany Roberts developed rhythmic jerking movements of the head, hands and feet for approximately five minutes. She went into a deep sleep immediately following this event and was picked up by EMS before the daughter could see her rouse. Today, she denies any focal neurological deficit, dizziness, headache, visual field loss.  Per her daughter, Brittany Roberts has developed progressive confusion that abruptly began two weeks ago. Since then, she's noticed that her mother has not been as sharp as she usually is. At baseline, Brittany Roberts walks with a walker and has home health assist her a few hours a day. Although she lives alone, she does not manage her bills or bathing.  Pertinent Imaging CT head 7/12: Focal hypo attenuation posterior right frontal lobe is new since the 2016 exams and probably related to interval nonacute infarct given subtle ex vacuo dilatation right frontal horn. Chronic small vessel disease. EEG pending MRI pending  Past Medical History:  Diagnosis Date  . Acute delirium 01/14/2017  . Anxiety   . Arthritis    "all over"  . Asthma   . Bipolar disorder ([place])   . Complication of anesthesia    "w/right foot OR they gave me too much and they couldn't get me woke"  . Congestive heart failure, unspecified   . Coronary artery calcification seen on CAT scan 01/14/2017  . Coronary artery disease    "I've got 1 stent" (06/30/2015)  . Depression   . Discoid lupus   .  Fibromyalgia    "I've been told I have this; can't take Lyrica cause I'm allergic to it" (06/30/2015)  . GERD (gastroesophageal reflux disease)   . History of gout   . History of hiatal hernia    "real bad" (06/30/2015)  . Hypertension   . Hypothyroidism   . On home oxygen therapy    "2L q hs" (06/30/2015)  . OSA (obstructive sleep apnea)    "just use my oxygen; no mask" (06/30/2015)  . Other and unspecified hyperlipidemia   . Penetrating foot wound    left nonhealing foot wound on the dorsal surface  . Peripheral neuropathy   . Pneumonia "many of times"  . Refusal of blood transfusions as patient is Jehovah's Witness   .  Renal failure, unspecified    "kidneys not working 100%" (06/30/2015)  . Sickle cell trait ([place])   . Systemic lupus ([place])   . Thoracic aortic atherosclerosis ([place]) 01/14/2017  . Type II diabetes mellitus ([place])   . Unspecified vitamin D deficiency     Past Surgical History:  Procedure Laterality Date  . ANKLE ARTHROSCOPY WITH OPEN REDUCTION INTERNAL FIXATION (ORIF) Right 03/2011  . CARDIAC CATHETERIZATION N/A 07/01/2015   Procedure: Left Heart Cath and Coronary Angiography;  Surgeon: Adrian Prows, MD;  Location: [place] [facility];  Service: Cardiovascular;  Laterality: N/A;  . CARDIAC CATHETERIZATION N/A 07/01/2015   Procedure: Intravascular Pressure Wire/FFR Study;  Surgeon: Adrian Prows, MD;  Location: [place] [facility];  Service: Cardiovascular;  Laterality: N/A;  . CARPAL TUNNEL RELEASE Bilateral   . CATARACT EXTRACTION W/ INTRAOCULAR LENS  IMPLANT, BILATERAL Bilateral   . CHOLECYSTECTOMY OPEN    . COLONOSCOPY N/A 12/10/2013   Procedure: COLONOSCOPY;  Surgeon: Inda Castle, MD;  Location: [facility];  Service: Endoscopy;  Laterality: N/A;  . FRACTURE SURGERY    . INCISION AND DRAINAGE ABSCESS Left    foot "under my little toe"  . KNEE LIGAMENT RECONSTRUCTION Right   . LEFT HEART CATHETERIZATION WITH CORONARY ANGIOGRAM N/A 08/08/2012   Procedure: LEFT HEART  CATHETERIZATION WITH CORONARY ANGIOGRAM;  Surgeon: [name], MD;  Location: [facility] CATH LAB;  Service: Cardiovascular;  Laterality: N/A;  . NASAL SINUS SURGERY    . PERCUTANEOUS CORONARY STENT INTERVENTION (PCI-S) N/A 08/21/2012   Procedure: PERCUTANEOUS CORONARY STENT INTERVENTION (PCI-S);  Surgeon: [name], MD;  Location: [facility] CATH LAB;  Service: Cardiovascular;  Laterality: N/A;  . TUBAL LIGATION      Family History  Problem Relation Age of Onset  . Heart disease Mother   . Diabetes Mother   . Clotting disorder Mother   . Pneumonia Father   . Rheum arthritis Father   . Diabetes Sister   . Diabetes Sister   . Asthma Brother   . Cancer Brother   . Kidney disease Brother   . Lupus Son   . Heart disease Son   . Heart disease Daughter   . Colon cancer Neg Hx     Social History:  reports that she quit smoking about 18 years ago. Her smoking use included Cigarettes. She started smoking about 56 years ago. She has a 120.00 pack-year smoking history. She has never used smokeless tobacco. She reports that she drinks alcohol. She reports that she does not use drugs.  Allergies  Allergen Reactions  . Ace Inhibitors Other (See Comments)    Coincided with sig bump in creat. Retried and creat bumped again.  Beulah Gandy [Isosorb Dinitrate-Hydralazine] Other (See Comments)    Sleep all the time.  Ebbie Ridge [Buspirone] Other (See Comments)    pain  . Pregabalin Swelling  . Ropinirole Hydrochloride Swelling  . Amantadines Rash  . Penicillins Swelling and Rash    Has patient had a PCN reaction causing immediate rash, facial/tongue/throat swelling, SOB or lightheadedness with hypotension: Yes Has patient had a PCN reaction causing severe rash involving mucus membranes or skin necrosis: Yes Has patient had a PCN reaction that required hospitalization: Yes Has patient had a PCN reaction occurring within the last 10 years: No If all of the above answers are "NO", then may proceed with  Cephalosporin use.     MEDICATIONS:  Current Meds  Medication Sig  . albuterol (PROAIR HFA) 108 (90 Base) MCG/ACT inhaler inhale 2 puffs by mouth every 4 hours if needed USE ONLY IF YOU ARE WHEEZING  . amLODipine (NORVASC) 10 MG tablet Take 1 tablet (10 mg total) by mouth daily.  . Ascorbic Acid (VITAMIN C PO) Take 1 tablet by mouth daily.  Marland Kitchen aspirin EC 81 MG tablet Take 162 mg by mouth daily.   Marland Kitchen atorvastatin (LIPITOR) 20 MG tablet Take 1 tablet (20 mg total) by mouth daily.  . B Complex-Biotin-FA (B COMPLETE) TABS Take 1 tablet by mouth daily.  . Cholecalciferol (VITAMIN D3) 5000 UNITS CAPS Take 5,000 Units by mouth daily.  . DULoxetine (CYMBALTA) 30 MG capsule Take 1 capsule (30 mg total) by mouth daily. (Patient taking differently: Take 60 mg by mouth daily. )  . Flaxseed, Linseed, (FLAXSEED OIL PO) Take 1 capsule by mouth daily.  . fluticasone (FLOVENT HFA) 220 MCG/ACT inhaler inhale 1 puff by mouth twice a day  . furosemide (LASIX) 20 MG tablet Take 1 tablet (20 mg total) by mouth daily.  Marland Kitchen gabapentin (NEURONTIN) 300 MG capsule Take 1 capsule (300 mg total) by mouth 2 (two) times daily.  Marland Kitchen guaiFENesin (MUCINEX) 600 MG 12 hr tablet Take 1 tablet (600 mg total) by mouth 2 (two) times daily.  . Insulin NPH, Human,, Isophane, (HUMULIN N KWIKPEN) 100 UNIT/ML Kiwkpen Inject 30 Units into the skin every morning.  . Insulin Pen Needle (PEN NEEDLES 31GX5/16") 31G X 8 MM MISC Check Blood sugar three times per day. Dx insulin using E11.8  . levothyroxine (SYNTHROID, LEVOTHROID) 25 MCG tablet Take 25 mcg by mouth daily.  Marland Kitchen lisinopril (PRINIVIL,ZESTRIL) 10 MG tablet Take 10 mg by mouth daily.  . metFORMIN (GLUCOPHAGE) 500 MG tablet Take 1 tablet (500 mg total) by mouth 2 (two) times daily with a meal.  . metoprolol succinate (TOPROL-XL) 50 MG 24 hr tablet TAKE ONE TABLET BY MOUTH ONCE  DAILY WITH OR IMMEDIATELY FOLLOWING A MEAL  . mometasone (ELOCON) 0.1 % ointment Apply topically daily. (Patient taking differently: Apply 1 application topically daily as needed (supplement). )  . Multiple Vitamin (MULTIVITAMIN WITH MINERALS) TABS tablet Take 1 tablet by mouth daily.  . nitroGLYCERIN (NITROSTAT) 0.4 MG SL tablet Place 1 tablet (0.4 mg total) under the tongue every 5 (five) minutes as needed for chest pain.  . pantoprazole (PROTONIX) 40 MG tablet Take 1 tablet (40 mg total) by mouth 2 (two) times daily.  . psyllium (METAMUCIL SMOOTH TEXTURE) 28 % packet Take 1 packet by mouth 2 (two) times daily.  . QUEtiapine (SEROQUEL) 25 MG tablet Take 2 tablets (50 mg total) by mouth at bedtime.  . traMADol (ULTRAM) 50 MG tablet TAKE ONE TABLET BY MOUTH THREE TIMES A DAY.  . traZODone (DESYREL) 50 MG tablet Take 1 tablet (50 mg total) by mouth at bedtime.     Review Of Systems:  History obtained from the patient  General: Negative for chills, fatigue, fever Psychological: Positive for any known behavioral disorder Ophthalmic: Negative for blurry or double vision, loss of vision in any field ENT: Negative for tinnitus or vertigo Respiratory: Negative for cough, shortness of breath Cardiovascular: Negative for chest pain, edema or irregular heartbeat Gastrointestinal: Negative for abdominal pain Genito-Urinary: Negative for dysuria Musculoskeletal: Negative for joint swelling or muscular weakness Neurological: As noted in HPI Dermatological: Negative for rash or skin changes, numbness or tingling  Blood pressure 139/67, pulse (!) 104, temperature 99.1 F (37.3 C), temperature source Oral, resp. rate 19, height 5\' 3"  (1.6 m), weight 119 kg (262 lb 4.8 oz), SpO2 99 %.   Physical Examination:                                                                                                       General: WDWN female. Appears calm and comfortable in bed. Daughter at bedside. HEENT:  Normocephalic, no lesions, without obvious abnormality.  Normal external eye and conjunctiva.  Normal external ears. Normal external nose, mucus membranes and septum.  Normal pharynx. Cardiovascular: S1, S2 normal, pulses palpable throughout   Pulmonary: chest clear, no wheezing, rales, normal symmetric air entry Abdomen: soft, non-tender Extremities: no joint deformities, effusion, or inflammation Musculoskeletal: no joint tenderness, deformity or swelling Skin: warm and dry, no hyperpigmentation, vitiligo, or suspicious lesions  Neurological Examination:                                                                                               Mental Status: JAMEILA KEENY is alert, oriented to self, daughter and hospital. Speech fluent without evidence of aphasia. Able to follow 3-step commands without difficulty. Cranial Nerves: II: Visual fields grossly normal, pupils are equal, round, reactive to light  III,IV, VI: Ptosis not present, extra-ocular muscle movements intact bilaterally V,VII: Smile and eyebrow raise is symmetric. Facial light touch intact throughout and neglected on the left VIII: Hearing grossly intact to loud voice IX,X: Uvula and palate rise symmetrically XI: SCM and bilateral shoulder shrug strength 5/5 XII: Midline tongue extension Motor: Right :     Upper extremity   5/5   Left:     Upper extremity   5/5          Lower extremity   5/5     Lower extremity   5/5 Sensory: Light touch intact throughout and neglected on LLE Deep Tendon Reflexes: 2+ and symmetric throughout Plantars: Right: downgoing   Left: downgoing Cerebellar: No ataxia noted in limb movement. Gait: Normal gait and station   Lab Results: Basic Metabolic Panel:  Recent Labs Lab 01/13/17 1200 01/14/17 0404  NA  136 139  K 4.2 4.2  CL 104 107  CO2 20* 18*  GLUCOSE 311* 226*  BUN 7 9  CREATININE  1.22* 1.22*  CALCIUM 9.5 9.0    Liver Function Tests:  Recent Labs Lab 01/13/17 1200  AST 32  ALT 15  ALKPHOS 132*  BILITOT 0.8  PROT 8.6*  ALBUMIN 3.6   No results for input(s): LIPASE, AMYLASE in the last 168 hours. No results for input(s): AMMONIA in the last 168 hours.  CBC:  Recent Labs Lab 01/13/17 1200 01/14/17 0404  WBC 13.5* 14.0*  NEUTROABS 12.0*  --   HGB 12.7 13.2  HCT 37.5 38.1  MCV 71.0* 70.7*  PLT 244 219    Cardiac Enzymes: No results for input(s): CKTOTAL, CKMB, CKMBINDEX, TROPONINI in the last 168 hours.  Lipid Panel: No results for input(s): CHOL, TRIG, HDL, CHOLHDL, VLDL, LDLCALC in the last 168 hours.  CBG:  Recent Labs Lab 01/13/17 2203 01/14/17 0708 01/14/17 1144  GLUCAP 233* 207* 222*    Microbiology: Results for orders placed or performed in visit on 04/05/14  Urine culture     Status: None   Collection Time: 04/05/14  4:58 PM  Result Value Ref Range Status   Colony Count 2,000 COLONIES/ML  Final   Organism ID, Bacteria Insignificant Growth  Final    Coagulation Studies: No results for input(s): LABPROT, INR in the last 72 hours.  Imaging: Dg Chest 2 View  Result Date: 01/13/2017 CLINICAL DATA:  Altered mental status EXAM: CHEST  2 VIEW COMPARISON:  08/01/2010 FINDINGS: There is no focal consolidation. There are low lung volumes. There is bilateral interstitial prominence and prominence of the central pulmonary vasculature likely accentuated by low lung volumes. There is no pleural effusion or pneumothorax. There is stable cardiomegaly. The osseous structures are unremarkable. IMPRESSION: Cardiomegaly.  No evidence of CHF. Electronically Signed   By: Kathreen Devoid   On: 01/13/2017 11:55   Ct Head Wo Contrast  Result Date: 01/13/2017 CLINICAL DATA:  Seizure activity. EXAM: CT HEAD WITHOUT CONTRAST TECHNIQUE: Contiguous axial images were obtained from the base of the skull through the vertex without intravenous contrast.  COMPARISON:  MRI head 02/21/2015.  Head CT 08/01/2014. FINDINGS: Brain: Hypo attenuation in the posterior right frontal lobe is probably encephalomalacia related to prior infarct although there is some preservation of the overlying cortex. No evidence for acute hemorrhage, hydrocephalus, or abnormal extra-axial fluid collection. Diffuse loss of parenchymal volume is consistent with atrophy. Patchy low attenuation in the deep hemispheric and periventricular white matter is nonspecific, but likely reflects chronic microvascular ischemic demyelination. Vascular: No hyperdense vessel or unexpected calcification. Skull: No evidence for fracture. No worrisome lytic or sclerotic lesion. Sinuses/Orbits: The visualized paranasal sinuses and mastoid air cells are clear. Visualized portions of the globes and intraorbital fat are unremarkable. Other: None. IMPRESSION: 1. Focal hypo attenuation posterior right frontal lobe is new since the 2016 exams and probably related to interval nonacute infarct given subtle ex vacuo dilatation right frontal horn. There is some preservation of the overlying cortex and although vasogenic edema is considered less likely, MRI of the brain is suggested for more definitive characterization. 2. Diffuse atrophy with chronic small vessel white matter ischemic disease. 3. Electronically Signed   By: Misty Stanley M.D.   On: 01/13/2017 12:42   Ct Angio Chest Pe W And/or Wo Contrast  Result Date: 01/13/2017 CLINICAL DATA:  Seizures, shortness of breath, lethargy and confusion. History of sickle cell disease. EXAM: CT  ANGIOGRAPHY CHEST WITH CONTRAST TECHNIQUE: Multidetector CT imaging of the chest was performed using the standard protocol during bolus administration of intravenous contrast. Multiplanar CT image reconstructions and MIPs were obtained to evaluate the vascular anatomy. CONTRAST:  100 cc Omnipaque 370 COMPARISON:  None. FINDINGS: Cardiovascular: The heart is mildly enlarged. No  pericardial effusion. The aorta is normal in caliber. Mild tortuosity and moderate atherosclerotic calcifications. Coronary artery calcifications are noted. The pulmonary arterial tree is fairly well opacified. There is moderate breathing motion artifact but no definite large central pulmonary emboli. Mediastinum/Nodes: Small scattered mediastinal and hilar lymph nodes but no mass or overt adenopathy. The esophagus is grossly normal. Lungs/Pleura: Moderate breathing motion artifact. No definite acute pulmonary findings such as infiltrate, edema or effusion. No worrisome pulmonary lesions. Upper Abdomen: Small hiatal hernia noted. Partially calcified spleen. Status post cholecystectomy. Large right renal cyst. Moderate aortic calcifications. Chest wall/ Musculoskeletal: No breast masses, supraclavicular or axillary lymphadenopathy. There is massive left-sided thyromegaly also demonstrated on prior thyroid ultrasound examinations. Moderate deviation of the trachea rightward. No significant bony findings. Review of the MIP images confirms the above findings. IMPRESSION: 1. Examination is somewhat limited by breathing motion artifact and body habitus. 2. No definite CT findings for pulmonary embolism. 3. Normal caliber thoracic aorta. Moderate atherosclerotic calcifications. Three-vessel coronary artery calcifications. 4. No significant pulmonary findings. 5. No significant upper abdominal findings. Aortic Atherosclerosis (ICD10-I70.0). Electronically Signed   By: Marijo Sanes M.D.   On: 01/13/2017 15:24   Thank you for consulting the [facility] team. Assessment and plan per attending neurologist.   Solon Augusta PA-C [facility]  01/14/2017, 2:57 PM Patient seen and examined. Please see detailed history, physical and ROS above. Briefly, Brittany Roberts is a 76 year old female with past history of lupus, hypertension hyperlipidemia coronary artery disease type 2 diabetes and fibromyalgia who  according to her daughter had 5 minutes. rhythmic jerking movement of her arms and legs along with head twisting to the left, after which she was very lethargic and unarousable until EMS arrived. She was admitted to the family medicine service for evaluation of altered mental status. She has no history of seizures prior to this episode. No history of strokes prior to this episode Family reports that for the past few years, she has been steadily declining in terms of her cognitive ability and memory. Denies any history of persistent headaches. Denies any history of visual loss. Denies any preceding illnesses. Denies any fevers, chills, bowel pain, diarrhea, easy bruising bleeding, mood disorder.  Examination I examined her right after she arrived back after unsuccessful attempts to get MRI and EEG. She was sleepy, easily arousable, oriented to person and place but not the year. She knew the president. She could name the city she was in, state and county. Naming, comprehension and repetition was intact. Cranial nerve exam: Pupils were equal round reactive to light, extraocular movements were intact, visual fields were full to threat, face was grossly symmetric although she is edentulous. Motor exam: Symmetric 4/5 strength in all 4 extremities. Sensory exam: Was a little inconsistent but she seemed to have some decreased sensation on the left face compared to the right and also on the left arm and leg compared to the right. DTRs: 2+ all over with downgoing toes. Coordination: No dysmetria noted on finger-nose-finger Gait was not tested  Imaging I reviewed the brain imaging-CT scan of the head that shows generalized atrophy, chronicWM disease and a hypodensity in the right frontal region which is new  compared to the MRI scan from 2016 that did not show any abnormality in that area.  Labs Pertinent labs were reviewed, CMP revealed glucose of 226 and creatinine of 1.2 to the GFR of 48, rest of the  values were normal. CBC revealed a white count of 14.0  B12 - 621 Hemoglobin A1c 7.6 in June 2018 TSH 0.36 in March 2018   Assessment and Plan: This is a 76 year old with multiple vascular risk factors and a history of gradual decline in her cognitive abilities over the past few years who was brought in for evaluation of a 5 minute episode of whole-body jerking and neck deviating to the left. Following the event, she was confused and very sleepy. She has not returned 100% of baseline. Imaging of the brain done as a part of the workup of altered mental status showed a right frontal hypodensity on her CT scan which is new since 2016. There is no history of clinical strokes or seizures prior to this episode.  Impression Seizure secondary to the right frontal lesion. Toxic metabolic encephalopathy Less likely syncope  Recommendations  Seizure - likely --Given the presence of an underlying structural lesion and a episode concerning for seizure, I would recommend starting this patient on Keppra 500 mg twice a day. --Maintain seizure precautions. --Obtain MRI of the brain to better characterize the hypodensity seen on CT scan as well as look for any other structural lesions. -- EEG --At this time I would also recommend ordering Ativan IV when necessary indications for the clinical seizure activity. I would recommend avoiding any sedating medications unless needed for clinical seizures.  Toxic metabolic encephalopathy - likely --Workup underway by the primary team. Vitamin B12 levels are normal. --Recheck TSH levels --Correcting metabolic abnormalities per primary team as you're.  I would not recommend pursuing a syncope workup at this time, unless indicated for another reason.  We will follow with you Please call with questions Plan was relayed to the primary team resident via phone at 1645 hrs.  Amie Portland, MD [facility] [phone]  If 7pm to 7am, please call on  call as listed on AMION.

## 2017-01-15 ENCOUNTER — Inpatient Hospital Stay (HOSPITAL_COMMUNITY): Payer: Medicare Other

## 2017-01-15 LAB — GLUCOSE, CAPILLARY
Glucose-Capillary: 253 mg/dL — ABNORMAL HIGH (ref 65–99)
Glucose-Capillary: 205 mg/dL — ABNORMAL HIGH (ref 65–99)
Glucose-Capillary: 147 mg/dL — ABNORMAL HIGH (ref 65–99)
Glucose-Capillary: 162 mg/dL — ABNORMAL HIGH (ref 65–99)

## 2017-01-15 LAB — BASIC METABOLIC PANEL
Anion gap: 11 (ref 5–15)
BUN: 15 mg/dL (ref 6–20)
CO2: 19 mmol/L — ABNORMAL LOW (ref 22–32)
Calcium: 8.5 mg/dL — ABNORMAL LOW (ref 8.9–10.3)
Chloride: 108 mmol/L (ref 101–111)
Creatinine, Ser: 1.68 mg/dL — ABNORMAL HIGH (ref 0.44–1.00)
GFR calc Af Amer: 33 mL/min — ABNORMAL LOW (ref >60)
GFR calc non Af Amer: 28 mL/min — ABNORMAL LOW (ref >60)
Glucose, Bld: 243 mg/dL — ABNORMAL HIGH (ref 65–99)
Potassium: 4.1 mmol/L (ref 3.5–5.1)
Sodium: 138 mmol/L (ref 135–145)

## 2017-01-15 LAB — TSH: TSH: 0.404 u[IU]/mL (ref 0.350–4.500)

## 2017-01-15 MED ORDER — LORAZEPAM 2 MG/ML IJ SOLN
0.5000 mg | Freq: Once | INTRAMUSCULAR | Status: DC
  Filled 2017-01-15: qty 1

## 2017-01-15 MED ORDER — LORAZEPAM 2 MG/ML IJ SOLN: 0.5000 mg | Freq: Once | INTRAMUSCULAR | Status: DC | PRN

## 2017-01-15 MED ORDER — METOPROLOL SUCCINATE ER 25 MG PO TB24
12.5000 mg | ORAL_TABLET | Freq: Every day | ORAL | Status: DC
  Administered 2017-01-16 – 2017-01-18 (×3): 12.5 mg via ORAL
  Filled 2017-01-15 (×3): qty 1

## 2017-01-15 NOTE — Evaluation (Signed)
Occupational Therapy Evaluation Patient Details Name: Brittany Roberts MRN: [id number] DOB: [birth year] Today's Date: 01/15/2017    History of Present Illness Pt is a 76 y/o female admitted secondary to likely tonic-clonic seizure activity. PMH including but not limited to COPD, CHF, CAD, CKD, DM, HLD, HTN, SLE and schizophrenia.   Clinical Impression   PTA Pt required assist for bathing and dressing from CNA, and used DME for mobility. Pt is currently mod A for ADL (LB) and min guard for upper body ADL. Pt min guard for mobility with RW in room. Pt presenting with below deficits (see OT problem list) and will benefit from skilled OT in the acute setting prior to dc to SNF level therapy to maximize safety and independence in ADL and functional transfers. Next session to educated Pt on energy conservation and implement strategies during ADL activity.     Follow Up Recommendations  SNF;Supervision/Assistance - 24 hour    Equipment Recommendations  Other (comment) (defer to next venue)    Recommendations for Other Services       Precautions / Restrictions Precautions Precautions: Fall;Other (comment) (seizure precautions) Restrictions Weight Bearing Restrictions: No      Mobility Bed Mobility Overal bed mobility: Needs Assistance Bed Mobility: Supine to Sit     Supine to sit: Min guard;HOB elevated     General bed mobility comments: increased time, verbal cues, use of bed rails - no physical assist needed  Transfers Overall transfer level: Needs assistance Equipment used: Rolling walker (2 wheeled) Transfers: Sit to/from Stand Sit to Stand: Min assist;+2 safety/equipment         General transfer comment: +2 for safety with initial boost    Balance Overall balance assessment: Needs assistance Sitting-balance support: Feet supported Sitting balance-Leahy Scale: Fair Sitting balance - Comments: min guard to sit EOB   Standing balance support: No upper extremity  supported;During functional activity Standing balance-Leahy Scale: Poor Standing balance comment: relaint on RW during ambulation, leans against sink for balance during ADL                           ADL either performed or assessed with clinical judgement   ADL Overall ADL's : Needs assistance/impaired Eating/Feeding: Modified independent;Sitting   Grooming: Wash/dry face;Oral care;Min guard;Standing Grooming Details (indicate cue type and reason): sink level, no cueing needed Upper Body Bathing: Min guard   Lower Body Bathing: Moderate assistance   Upper Body Dressing : Set up   Lower Body Dressing: Moderate assistance   Toilet Transfer: Min guard;Ambulation;RW Toilet Transfer Details (indicate cue type and reason): simulated through recliner transfer post ambulation to sink     Tub/ Shower Transfer: Minimal assistance   Functional mobility during ADLs: Min guard;Rolling walker;Cueing for safety General ADL Comments: Pt able to sequence multi-step grooming tasks without vc     Vision Patient Visual Report: No change from baseline       Perception     Praxis      Pertinent Vitals/Pain Pain Assessment: Faces Faces Pain Scale: No hurt     Hand Dominance Right   Extremity/Trunk Assessment Upper Extremity Assessment Upper Extremity Assessment: Overall WFL for tasks assessed;Generalized weakness   Lower Extremity Assessment Lower Extremity Assessment: Defer to PT evaluation   Cervical / Trunk Assessment Cervical / Trunk Assessment: Normal   Communication Communication Communication: No difficulties   Cognition Arousal/Alertness: Awake/alert Behavior During Therapy: WFL for tasks assessed/performed Overall Cognitive Status: Within Functional Limits for  tasks assessed                                 General Comments: Pt able to remember multi-step tasks, problem solve during session, not asked orientation questions, Pt joking around  during session   General Comments  Pt's daughter present for session    Exercises     Shoulder Instructions      Home Living Family/patient expects to be discharged to:: Private residence Living Arrangements: Alone Available Help at Discharge: Family;Personal care attendant;Other (Comment) (CNA 2 hours a day) Type of Home: Apartment Home Access: Level entry     Home Layout: One level     Bathroom Shower/Tub: Occupational psychologist: Handicapped height Bathroom Accessibility: Yes How Accessible: Accessible via wheelchair Home Equipment: [place] - 2 wheels;Toilet riser;Wheelchair - manual;Shower seat          Prior Functioning/Environment Level of Independence: Needs assistance  Gait / Transfers Assistance Needed: pt ambulates with use of RW ADL's / Homemaking Assistance Needed: pt requires assistance for ADLs            OT Problem List: Decreased strength;Decreased activity tolerance;Impaired balance (sitting and/or standing);Decreased safety awareness;Decreased knowledge of use of DME or AE;Obesity      OT Treatment/Interventions: Self-care/ADL training;Energy conservation;DME and/or AE instruction;Therapeutic activities;Patient/family education;Balance training    OT Goals(Current goals can be found in the care plan section) Acute Rehab OT Goals Patient Stated Goal: to get stronger OT Goal Formulation: With patient/family Time For Goal Achievement: 01/29/17 Potential to Achieve Goals: Good ADL Goals Pt Will Perform Grooming: with modified independence;standing Pt Will Transfer to Toilet: with modified independence;ambulating (with RW) Pt Will Perform Toileting - Clothing Manipulation and hygiene: with modified independence;sit to/from stand Additional ADL Goal #1: Pt will recall 3 ways of conserving energy during ADL with less than 1 verbal cue  OT Frequency: Min 2X/week   Barriers to D/C: Decreased caregiver support  Pt lives alone        Co-evaluation              AM-PAC PT "6 Clicks" Daily Activity     Outcome Measure Help from another person eating meals?: None Help from another person taking care of personal grooming?: A Little Help from another person toileting, which includes using toliet, bedpan, or urinal?: A Little Help from another person bathing (including washing, rinsing, drying)?: A Lot Help from another person to put on and taking off regular upper body clothing?: A Little Help from another person to put on and taking off regular lower body clothing?: A Lot 6 Click Score: 17   End of Session Equipment Utilized During Treatment: Gait belt;Rolling walker Nurse Communication: Mobility status;Other (comment) (would like to bathe)  Activity Tolerance: Patient tolerated treatment well Patient left: in chair;with call bell/phone within reach;with chair alarm set;with family/visitor present  OT Visit Diagnosis: Unsteadiness on feet (R26.81);Muscle weakness (generalized) (M62.81);Other symptoms and signs involving the nervous system (R29.898)                Time: 7408-1448 OT Time Calculation (min): 20 min Charges:  OT General Charges $OT Visit: 1 Procedure OT Evaluation $OT Eval Moderate Complexity: 1 Procedure G-Codes:     Hulda Humphrey OTR/L [place] 01/15/2017, 10:37 AM

## 2017-01-15 NOTE — Clinical Social Work Note (Signed)
Clinical Social Work Assessment  Patient Details  Name: Brittany Roberts MRN: [id number] Date of Birth: 12/15/1940  Date of referral:  01/15/17               Reason for consult:  Facility Placement, Discharge Planning                Permission sought to share information with:  Facility Sport and exercise psychologist, Family Supports Permission granted to share information::  Yes, Verbal Permission Granted  Name::     Human resources officer::  SNF  Relationship::  Daughter  Contact Information:     Housing/Transportation Living arrangements for the past 2 months:  Single Family Home Source of Information:  Patient Patient Interpreter Needed:  None Criminal Activity/Legal Involvement Pertinent to Current Situation/Hospitalization:  No - Comment as needed Significant Relationships:  Adult Children Lives with:  Self Do you feel safe going back to the place where you live?  Yes Need for family participation in patient care:  Yes (Comment) (patient currently not fully oriented)  Care giving concerns:  Patient currently lives at home alone and needs rehabilitation to improve strength prior to returning home in order to successfully complete ADLs.   Social Worker assessment / plan:  CSW introduced self to patient's daughter, [name], at bedside, and explained role. CSW explained recommendation for SNF placement, and patient's daughter indicated that she was familiar with what SNF placement meant. Patient's daughter indicated that she had been at [facility] in the past, but would be open to looking into other facilities for placement. CSW to fax out referral and provide bed offers.  Employment status:  Retired Nurse, adult PT Recommendations:  [place] / Referral to community resources:  [place]  Patient/Family's Response to care:  Patient and patient's daughter agreeable to SNF placement.  Patient/Family's Understanding of and Emotional  Response to Diagnosis, Current Treatment, and Prognosis:  Patient and patient's daughter seem to understand the need for additional support and rehabilitation at discharge prior to returning home. Patient and patient's daughter indicated understanding of CSW role in discharge planning.  Emotional Assessment Appearance:  Appears stated age Attitude/Demeanor/Rapport:    Affect (typically observed):  Appropriate Orientation:  Oriented to Place, Oriented to Self Alcohol / Substance use:  Not Applicable Psych involvement (Current and /or in the community):  No (Comment)  Discharge Needs  Concerns to be addressed:  Care Coordination, Discharge Planning Concerns Readmission within the last 30 days:  No Current discharge risk:  Lives alone, Physical Impairment Barriers to Discharge:  Continued Medical Work up   [employer], [place] 01/15/2017, 11:34 AM

## 2017-01-15 NOTE — Procedures (Signed)
   ELECTROENCEPHALOGRAM REPORT  Date of Study: 01/15/17  Patient's Name: Brittany Roberts MRN: [id number] Date of Birth: 07-07-1940  Referring Provider: Amie Portland, MD  Clinical History: [name] is a 76 y.o. F with multiple medical problems who recently presented with transient rhythmic jerking movements suspicious for a seizure.  There has also been difficulties with recent onset confusion. CT head 7/12: Focal hypo attenuation posterior right frontal lobe is new since the 2016 exams and probably related to interval nonacute infarct given subtle ex vacuo dilatation right frontal horn. Chronic small vessel disease   Medications: Scheduled Meds: . atorvastatin  20 mg Oral q1800  . budesonide (PULMICORT) nebulizer solution  0.5 mg Nebulization BID  . enoxaparin (LOVENOX) injection  60 mg Subcutaneous Q24H  . furosemide  20 mg Oral Daily  . gabapentin  100 mg Oral BID  . insulin aspart  0-9 Units Subcutaneous TID WC  . insulin NPH Human  15 Units Subcutaneous QAC breakfast  . levETIRAcetam  500 mg Oral BID  . levothyroxine  25 mcg Oral QAC breakfast  . metoprolol succinate  12.5 mg Oral Daily  . pantoprazole  40 mg Oral BID   Continuous Infusions: . sodium chloride Stopped (01/14/17 1900)   PRN Meds:.LORazepam, LORazepam, mometasone            Technical Summary: This is a standard 16 channel EEG recording performed according to the international 10-20 electrode system.  AP bipolar, transverse bipolar, and referential montages were obtained, and digitally reformatted as necessary.  Duration of tracing: 20:20  Description: In the awake state there is a 6 Hz theta rhythm seen from the posterior head regions in a symmetric fashion. Beta artifact is noted.  Drowsiness is noted by vertex sharp activity, however no definite stage II sleep was identified.   Hyperventilation and photic stimulation were not performed.  EKG was monitored and noted to be sinus rhythym with an average  heart rate of 84 bpm, with occasional PVC's noted.  Impression: This is an abnormal EEG due to background slowing seen throughout the tracing.  This is a very non-specific finding that can be seen with toxic, metabolic, diffuse, or multifocal structural processes.  No definite epileptiform changes were noted.   A single EEG without epileptiform changes does not exclude the diagnosis of epilepsy. Clinical correlation advised.   Carvel Getting, M.D. Neurology

## 2017-01-15 NOTE — Progress Notes (Signed)
Subjective: Seen and examined this AM No new complaints. Exam: Vitals:   01/15/17 0641 01/15/17 0810  BP: 137/70 138/72  Pulse: 69 72  Resp: 20 18  Temp: 98.2 F (36.8 C) 98 F (36.7 C)   Examination Alert awake oriented x2. She knew the president. She could name the city she was in, state and county. Naming, comprehension and repetition was intact. Cranial nerve exam: Pupils were equal round reactive to light, extraocular movements were intact, visual fields were full to threat, face was grossly symmetric although she is edentulous. Motor exam: Symmetric 4/5 strength in all 4 extremities. Sensory exam: Was a little inconsistent but she seemed to have some decreased sensation on the left face compared to the right and also on the left arm and leg compared to the right. DTRs: 2+ all over with downgoing toes. Coordination: No dysmetria noted on finger-nose-finger Gait was not tested  Labs reviewed - repeat TSH - 0.44 EEG done, results pending. MRI pending   Current Facility-Administered Medications:  .  0.9 %  sodium chloride infusion, , Intravenous, Continuous, Verner Mould, MD, Stopped at 01/14/17 1900 .  atorvastatin (LIPITOR) tablet 20 mg, 20 mg, Oral, q1800, Lovenia Kim, MD, 20 mg at 01/15/17 1806 .  budesonide (PULMICORT) nebulizer solution 0.5 mg, 0.5 mg, Nebulization, BID, McDiarmid, Blane Ohara, MD, 0.5 mg at 01/14/17 0812 .  enoxaparin (LOVENOX) injection 60 mg, 60 mg, Subcutaneous, Q24H, McDiarmid, Blane Ohara, MD, 60 mg at 01/14/17 2233 .  furosemide (LASIX) tablet 20 mg, 20 mg, Oral, Daily, Lovenia Kim, MD, 20 mg at 01/15/17 1031 .  gabapentin (NEURONTIN) capsule 100 mg, 100 mg, Oral, BID, Abraham, Sherin, DO, 100 mg at 01/15/17 1030 .  insulin aspart (novoLOG) injection 0-9 Units, 0-9 Units, Subcutaneous, TID WC, Verner Mould, MD, 1 Units at 01/15/17 1806 .  insulin NPH Human (HUMULIN N,NOVOLIN N) injection 15 Units, 15 Units, Subcutaneous, QAC  breakfast, Caroline More, DO, 15 Units at 01/15/17 0850 .  levETIRAcetam (KEPPRA) tablet 500 mg, 500 mg, Oral, BID, Abraham, Sherin, DO, 500 mg at 01/15/17 1030 .  levothyroxine (SYNTHROID, LEVOTHROID) tablet 25 mcg, 25 mcg, Oral, QAC breakfast, Lovenia Kim, MD, 25 mcg at 01/15/17 0849 .  LORazepam (ATIVAN) injection 0.5 mg, 0.5 mg, Intravenous, Once PRN, Caroline More, DO .  LORazepam (ATIVAN) injection 1 mg, 1 mg, Intravenous, PRN, Caroline More, DO .  metoprolol succinate (TOPROL-XL) 24 hr tablet 12.5 mg, 12.5 mg, Oral, Daily, Abraham, Sherin, DO .  mometasone (ELOCON) 0.1 % cream, , Topical, Daily PRN, McDiarmid, Blane Ohara, MD .  pantoprazole (PROTONIX) EC tablet 40 mg, 40 mg, Oral, BID, Lovenia Kim, MD, 40 mg at 01/15/17 1030  Assessment This is a 76 year old with multiple vascular risk factors and a history of gradual decline in her cognitive abilities over the past few years who was brought in for evaluation of a 5 minute episode of whole-body jerking and neck deviating to the left. Following the event, she was confused and very sleepy. Today she was nearly back to baseline per family. Imaging of the brain done as a part of the workup of altered mental status showed a right frontal hypodensity on her CT scan which is new since 2016. There is no history of clinical strokes or seizures prior to this episode.  Impression Seizure secondary to the right frontal lesion. Toxic metabolic encephalopathy Less likely syncope  Recommendations: MRI pending EEG done, results pending. PRN ativan for sz Keppra 500 BID Neurontin at home dose  Please call when MRI is done. Will follow EEG results and update primary team    Amie Portland, MD [facility] [phone]  If 7pm to 7am, please call on call as listed on AMION.

## 2017-01-15 NOTE — Progress Notes (Signed)
Family Medicine Teaching Service Daily Progress Note Intern Pager: [phone]  Patient name: Brittany Roberts Medical record number: [id number] Date of birth: Aug 05, 1940 Age: 76 y.o. Gender: female  Primary Care Provider: Zenia Resides, MD Consultants: None Code Status: DNR  Pt Overview and Major Events to Date:  Ms. Armentor is a 76 y.o. Female presenting with seizure like activity and AMS. Patient was admitted to [facility] on 01/13/17.   Assessment and Plan: LAVANDA NEVELS a 76 y.o.femalepresenting with seizure-like activity and AMS. PMH is significant for COPD, schizophrenia, HTN, Type II DM, HLD, GERD, depression, insomnia, hypothyroidism, SLE, chronic pain, hx TIA.   Altered mental status:  Has been slowly improving since admission. Possibly due to medication, dementia, worsening schizophrenia, or post-ictal state. Patient had recent medication change (addition of Seroquel two weeks ago and decrease of dose of Cymbalta), and is also taking multiple other medications that could affect mental status (gabapentin, trazodone, tramadol). Patient has been managing medications herself and family unsure if she is taking properly. Underlying dementia may be contributing however would not expect such an abrupt change in mental status. Patient does have history of schizophrenia which may be etiology or contributing factor, and recently has reported auditory hallucinations. Daughter states mother reported increased paranoia and visual hallucinations since starting Seroquel. Possible post-ictal state due to seizure-like activity prior to AMS. On admission WBC of 13.5, increasing to 14.0 on 7/13. Infectious etiology less likely since patient remains afebrile with current temperature of 98. CXR on 01/13/17 was normal. CT of head on 7/12 showing no acute findings. UDS and UA pending. B12 levels wnl. RPR non reactive. Patient was unable to be woken up and majority of history was from patient's daughter. Patient's daughter  notes patient fell and hit her head on either Sunday or Monday prior to admission. Due to negative CT findings, less likely hemorrhage. Daughter states she has shown improvement since admission. States she is recognizing family more and states is more oriented. Patient is still slightly agitated, daughter notes agitation increased at night time. Daughter states she uses prn O2 at home which improves her agitation. Daughter denies twitching but states she notices mother wiping her face more. Have contacted neurology and recommended MRI and EEG. Neurology will consult. Unable to perform MRI due to agitation.  - Seizure precautions - Neuro checks q2 - Hold home meds that may affect mental status (Seroquel, Cymbalta, trazodone, tramadol) - gabapentin at low dose, 100 mg bid - UA pending - UDS pending - MRI and EEG pending, will administer 0.5 mg Ativan for agitation prior to MRI - O2 prn at night - restart metoprolol at 12.5 mg lower dose due to hx of CAD  Seizure-like activity:  Has improved. Patient reported to have twisting and jerking of head, mouth, and extremities with bladder incontinence and drooling for about 5 minutes on day of admission. Subsequent period of unresponsiveness for 15 minutes. Does have history of tardive dyskinesia per chart review, though family unaware of this, and symptoms not typical of TD. Head CT on 7/12 shows no acute findings. Patient's daughter denies any recent seizure like activity. Daughter notes patient is wiping face more frequently. Denies any incontinence today as well. Have contacted neurology and recommended MRI and EEG. Neurology will consult. Unable to perform MRI due to agitation.  - Seizure precautions - Neuro check q2 - UDS pending - MRI and EEG pending, will administer 0.5 mg Ativan for agitation prior to MRI - Keppra 500 mg - Ativan  1mg  prn  - appreciate neuro recommendations  Schizophrenia: Family unaware of diagnosis and patient unable to  provide information. Started on Seroquel 25mg  qhs on 12/23/16 due to reports of auditory hallucinations. Daughter notes increased paranoia and visual hallucinations since beginning Seroquel. Dr. [name], PCP, has been made aware and has spoken to family.  - Discontinue Seroquel  - Consider sitter if patient becomes agitated - appreciate PCP recommendations   COPD:  Currently stable. Former smoker, but not current tobacco use.On Flovent at home with albuterol PRN. D-dimer elevated at 2.09. CTA on 7/12 showing no pulmonary embolism or acute findings. Current RR 20 with O2 saturation of 96% on RA. Lungs were clear to auscultation bilaterally.  - budesonide bid  - Continuous pulse ox - O2 PRN  CHF:  Currently stable. CXR with cardiomegaly but no evidence of CHF. On Lasix 20mg  at home. No signs of fluid overload on exam. No edema noted. Lungs CTAB bilaterally. Denies SOB.  - continue lasix 20 mg daily - monitor fluid status   CKD:  Cr 1.22 on admission. Cr of 1.22 on 7/13. Baseline of ~1.3.  - Closely monitor fluids   Type II DM with neuropathy: Will continue to monitor. Takes NPH 30U every AM. No sliding scale. Also prescribed gabapentin for neuropathy. Last A1C 7.6 on 12/28/16. Current CBG 253.  - sensitive sliding scale insulin  - will restart NPH at half dose and increase as needed per morning CBG on 7/15 - gabapentin 100 mg bid - monitor CBG  Depression - Hold home Cymbalta   HTN: Current BP 137/70. Prescribed amlodipine, lisinopril, and metoprolol at home.  - Hold home BP meds until neurology has consulted  - appreciate neurology recommendations for permissive HTN  GERD - continue home protonix   Hypothyroidism: On Synthroid. Last TSH 0.36 on 09/08/16.  - continue home synthroid   Insomnia - Hold home trazodone  Chronic pain:  Secondary to SLE per family report. Family is interested in seeing rheumatology as outpatient.  - Hold home tramadol  HLD:   Last lipid panel wnl on 07/2014.  - continue atorvastatin 20 mg   FEN/GI: Dys 3 diet, protonix PPx: lovenox   Disposition: improving   Subjective:  Ms. Gauna a 76 y.o.femalepresenting with seizure-like activity and AMS. PMH is significant for COPD, schizophrenia, HTN, Type II DM, HLD, GERD, depression, insomnia, hypothyroidism, SLE, chronic pain, hx TIA. Patient was asleep on exam and did not want to wake up. Daughter provided history. States patient is improving and recognizing people more. States patient is more oriented, now to person and place, but not to time. Patient is still slightly agitated but daughter states prn O2 eases agitation at night at home. Patient is coughing slightly but at well and has not twitched since admission. Patient is noted to wipe face more often today. Daughter notes that patient has had increased paranoia and visual hallucinations since taking Seroquel. Patient was also noted to have a fall and hit her head prior to admission.   Objective: Temp:  [98 F (36.7 C)-99.1 F (37.3 C)] 98.2 F (36.8 C) (07/14 0641) Pulse Rate:  [69-104] 69 (07/14 0641) Resp:  [18-20] 20 (07/14 0641) BP: (120-162)/(67-88) 137/70 (07/14 0641) SpO2:  [93 %-100 %] 93 % (07/14 0641) Physical Exam: General: asleep in bed, NAD, slightly agitated  Cardiovascular: RRR, no MRG Respiratory: CTAB, no wheezing, rales, or rhonchi  Abdomen: soft, non distended, bowel sounds in all 4 quadrants  Extremities: no edema Neuro: unable to perform  neuro assessment   Laboratory:  Recent Labs Lab 01/13/17 1200 01/14/17 0404  WBC 13.5* 14.0*  HGB 12.7 13.2  HCT 37.5 38.1  PLT 244 219    Recent Labs Lab 01/13/17 1200 01/14/17 0404  NA 136 139  K 4.2 4.2  CL 104 107  CO2 20* 18*  BUN 7 9  CREATININE 1.22* 1.22*  CALCIUM 9.5 9.0  PROT 8.6*  --   BILITOT 0.8  --   ALKPHOS 132*  --   ALT 15  --   AST 32  --   GLUCOSE 311* 226*    Drugs of Abuse  No results found for:  LABOPIA, COCAINSCRNUR, LABBENZ, AMPHETMU, THCU, LABBARB    Imaging/Diagnostic Tests: Dg Chest 2 View  Result Date: 01/13/2017 CLINICAL DATA:  Altered mental status EXAM: CHEST  2 VIEW COMPARISON:  08/01/2010 FINDINGS: There is no focal consolidation. There are low lung volumes. There is bilateral interstitial prominence and prominence of the central pulmonary vasculature likely accentuated by low lung volumes. There is no pleural effusion or pneumothorax. There is stable cardiomegaly. The osseous structures are unremarkable. IMPRESSION: Cardiomegaly.  No evidence of CHF. Electronically Signed   By: Kathreen Devoid   On: 01/13/2017 11:55   Ct Head Wo Contrast  Result Date: 01/13/2017 CLINICAL DATA:  Seizure activity. EXAM: CT HEAD WITHOUT CONTRAST TECHNIQUE: Contiguous axial images were obtained from the base of the skull through the vertex without intravenous contrast. COMPARISON:  MRI head 02/21/2015.  Head CT 08/01/2014. FINDINGS: Brain: Hypo attenuation in the posterior right frontal lobe is probably encephalomalacia related to prior infarct although there is some preservation of the overlying cortex. No evidence for acute hemorrhage, hydrocephalus, or abnormal extra-axial fluid collection. Diffuse loss of parenchymal volume is consistent with atrophy. Patchy low attenuation in the deep hemispheric and periventricular white matter is nonspecific, but likely reflects chronic microvascular ischemic demyelination. Vascular: No hyperdense vessel or unexpected calcification. Skull: No evidence for fracture. No worrisome lytic or sclerotic lesion. Sinuses/Orbits: The visualized paranasal sinuses and mastoid air cells are clear. Visualized portions of the globes and intraorbital fat are unremarkable. Other: None. IMPRESSION: 1. Focal hypo attenuation posterior right frontal lobe is new since the 2016 exams and probably related to interval nonacute infarct given subtle ex vacuo dilatation right frontal horn. There  is some preservation of the overlying cortex and although vasogenic edema is considered less likely, MRI of the brain is suggested for more definitive characterization. 2. Diffuse atrophy with chronic small vessel white matter ischemic disease. 3. Electronically Signed   By: Misty Stanley M.D.   On: 01/13/2017 12:42   Ct Angio Chest Pe W And/or Wo Contrast  Result Date: 01/13/2017 CLINICAL DATA:  Seizures, shortness of breath, lethargy and confusion. History of sickle cell disease. EXAM: CT ANGIOGRAPHY CHEST WITH CONTRAST TECHNIQUE: Multidetector CT imaging of the chest was performed using the standard protocol during bolus administration of intravenous contrast. Multiplanar CT image reconstructions and MIPs were obtained to evaluate the vascular anatomy. CONTRAST:  100 cc Omnipaque 370 COMPARISON:  None. FINDINGS: Cardiovascular: The heart is mildly enlarged. No pericardial effusion. The aorta is normal in caliber. Mild tortuosity and moderate atherosclerotic calcifications. Coronary artery calcifications are noted. The pulmonary arterial tree is fairly well opacified. There is moderate breathing motion artifact but no definite large central pulmonary emboli. Mediastinum/Nodes: Small scattered mediastinal and hilar lymph nodes but no mass or overt adenopathy. The esophagus is grossly normal. Lungs/Pleura: Moderate breathing motion artifact. No definite acute pulmonary  findings such as infiltrate, edema or effusion. No worrisome pulmonary lesions. Upper Abdomen: Small hiatal hernia noted. Partially calcified spleen. Status post cholecystectomy. Large right renal cyst. Moderate aortic calcifications. Chest wall/ Musculoskeletal: No breast masses, supraclavicular or axillary lymphadenopathy. There is massive left-sided thyromegaly also demonstrated on prior thyroid ultrasound examinations. Moderate deviation of the trachea rightward. No significant bony findings. Review of the MIP images confirms the above  findings. IMPRESSION: 1. Examination is somewhat limited by breathing motion artifact and body habitus. 2. No definite CT findings for pulmonary embolism. 3. Normal caliber thoracic aorta. Moderate atherosclerotic calcifications. Three-vessel coronary artery calcifications. 4. No significant pulmonary findings. 5. No significant upper abdominal findings. Aortic Atherosclerosis (ICD10-I70.0). Electronically Signed   By: Marijo Sanes M.D.   On: 01/13/2017 15:24     Caroline More, DO 01/15/2017, 6:45 AM PGY-1, [place] Intern pager: [phone], text pages welcome

## 2017-01-15 NOTE — Progress Notes (Signed)
Bedside EEG completed, results pending. 

## 2017-01-15 NOTE — NC FL2 (Signed)
[place] LEVEL OF CARE SCREENING TOOL     IDENTIFICATION  Patient Name: Brittany Roberts Birthdate: [birth year] Sex: female Admission Date (Current Location): 01/13/2017  [facility] and [place] Number:  Herbalist and Address:  [facility] [place], [place] [address] [place], [place] [address]      Provider Number: [id number]  Attending Physician Name and Address:  McDiarmid, Blane Ohara, MD  Relative Name and Phone Number:       Current Level of Care: Hospital Recommended Level of Care: [place] Prior Approval Number:    Date Approved/Denied:   PASRR Number: [id number]  Discharge Plan: SNF    Current Diagnoses: Patient Active Problem List   Diagnosis Date Noted  . Thoracic aortic atherosclerosis ([place]) 01/14/2017  . Coronary artery calcification seen on CAT scan 01/14/2017  . Acute delirium 01/14/2017  . Possible Tonic-clonic seizure disorder ([place]) 01/14/2017  . CVA (cerebral vascular accident) ([place]) 01/14/2017  . Altered mental status 01/13/2017  . Mastalgia 04/20/2016  . Bilateral leg edema 02/10/2016  . Hearing difficulty 10/30/2015  . Midline low back pain with right-sided sciatica 09/25/2015  . Angina pectoris ([place]) 06/30/2015  . Dysarthria 02/14/2015  . Sinusitis, chronic 10/25/2014  . Schizophrenia, unspecified type ([place]) 10/25/2014  . Type 2 diabetes mellitus with complication ([place])   . Cephalalgia   . Hx of systemic lupus erythematosus (SLE)   . Thyroid mass 04/26/2014  . Unspecified constipation 12/10/2013  . Benign neoplasm of colon 12/10/2013  . Controlled type 2 diabetes with neuropathy ([place]) 11/27/2013  . Cystic kidney disease 11/09/2013  . Insulin dependent type 2 diabetes mellitus ([place]) 10/29/2013  . Hoarseness or changing voice 09/27/2013  . Dyskinesia, tardive 07/11/2013  . TMJ syndrome 02/16/2013  . Angina pectoris associated with type 2 diabetes mellitus ([place]) 08/08/2012  . CAD in native artery  08/08/2012  . Asthma, chronic, PRESUMED wiht MULTIFACTORIAL DYSPNEA 05/30/2012  . Insomnia 09/22/2011  . Mobility poor 02/02/2011  . Chronic kidney disease, stage III (moderate) 05/13/2010  . Chronic combined systolic and diastolic CHF, NYHA class 2 ([place]) 03/31/2010  . COPD (chronic obstructive pulmonary disease) ([place]) 03/26/2010  . Morbid obesity ([place]) 02/27/2010  . DM (diabetes mellitus), type 2 with neurological complications ([place]) 10/62/6948  . Osteoarthritis 04/25/2009  . Anemia 03/11/2007  . COGNITIVE IMPAIRMENT, MILD, SO STATED 03/11/2007  . RESTLESS LEG SYNDROME 03/11/2007  . LUPUS ERYTHEMATOSUS, DISCOID 03/11/2007  . Hypothyroidism 03/07/2007  . DYSLIPIDEMIA 03/07/2007  . DEPRESSION 03/07/2007  . Essential hypertension 03/07/2007  . Coronary atherosclerosis 03/07/2007  . GERD 03/07/2007  . SLEEP APNEA 03/07/2007    Orientation RESPIRATION BLADDER Height & Weight     Self, Place  Normal Incontinent Weight: 262 lb 4.8 oz (119 kg) Height:  5\' 3"  (160 cm)  BEHAVIORAL SYMPTOMS/MOOD NEUROLOGICAL BOWEL NUTRITION STATUS    Convulsions/Seizures Continent    AMBULATORY STATUS COMMUNICATION OF NEEDS Skin   Extensive Assist Verbally Normal                       Personal Care Assistance Level of Assistance  Bathing, Dressing Bathing Assistance: Maximum assistance   Dressing Assistance: Maximum assistance     Functional Limitations Info             SPECIAL CARE FACTORS FREQUENCY  PT (By licensed PT), OT (By licensed OT)     PT Frequency: 5x/wk OT Frequency: 5x/wk  Contractures      Additional Factors Info  Code Status, Allergies, Insulin Sliding Scale Code Status Info: DNR Allergies Info: Ace Inhibitors, Bidil Isosorb Dinitrate-hydralazine, Buspar Buspirone, Pregabalin, Ropinirole Hydrochloride, Amantadines, Penicillins   Insulin Sliding Scale Info: 3x/day       Current Medications (01/15/2017):  This is the current hospital active  medication list Current Facility-Administered Medications  Medication Dose Route Frequency Provider Last Rate Last Dose  . 0.9 %  sodium chloride infusion   Intravenous Continuous Verner Mould, MD   Stopped at 01/14/17 1900  . atorvastatin (LIPITOR) tablet 20 mg  20 mg Oral q1800 Lovenia Kim, MD      . budesonide (PULMICORT) nebulizer solution 0.5 mg  0.5 mg Nebulization BID McDiarmid, Blane Ohara, MD   0.5 mg at 01/14/17 7902  . enoxaparin (LOVENOX) injection 60 mg  60 mg Subcutaneous Q24H McDiarmid, Blane Ohara, MD   60 mg at 01/14/17 2233  . furosemide (LASIX) tablet 20 mg  20 mg Oral Daily Lovenia Kim, MD   20 mg at 01/15/17 1031  . gabapentin (NEURONTIN) capsule 100 mg  100 mg Oral BID Tammi Klippel, Sherin, DO   100 mg at 01/15/17 1030  . insulin aspart (novoLOG) injection 0-9 Units  0-9 Units Subcutaneous TID WC Verner Mould, MD   5 Units at 01/15/17 [phone]  . insulin NPH Human (HUMULIN N,NOVOLIN N) injection 15 Units  15 Units Subcutaneous QAC breakfast Caroline More, DO   15 Units at 01/15/17 0850  . levETIRAcetam (KEPPRA) tablet 500 mg  500 mg Oral BID Tammi Klippel, Sherin, DO   500 mg at 01/15/17 1030  . levothyroxine (SYNTHROID, LEVOTHROID) tablet 25 mcg  25 mcg Oral QAC breakfast Lovenia Kim, MD   25 mcg at 01/15/17 0849  . LORazepam (ATIVAN) injection 0.5 mg  0.5 mg Intravenous Once Caroline More, DO      . LORazepam (ATIVAN) injection 1 mg  1 mg Intravenous PRN Tammi Klippel, Sherin, DO      . mometasone (ELOCON) 0.1 % cream   Topical Daily PRN McDiarmid, Blane Ohara, MD      . pantoprazole (PROTONIX) EC tablet 40 mg  40 mg Oral BID Lovenia Kim, MD   40 mg at 01/15/17 1030     Discharge Medications: Please see discharge summary for a list of discharge medications.  Relevant Imaging Results:  Relevant Lab Results:   Additional Information SS#: [id number]  Geralynn Ochs, LCSW

## 2017-01-16 ENCOUNTER — Inpatient Hospital Stay (HOSPITAL_COMMUNITY): Payer: Medicare Other

## 2017-01-16 DIAGNOSIS — R4182 Altered mental status, unspecified: Secondary | ICD-10-CM

## 2017-01-16 LAB — BASIC METABOLIC PANEL
Anion gap: 9 (ref 5–15)
BUN: 22 mg/dL — ABNORMAL HIGH (ref 6–20)
CO2: 20 mmol/L — ABNORMAL LOW (ref 22–32)
Calcium: 8.3 mg/dL — ABNORMAL LOW (ref 8.9–10.3)
Chloride: 109 mmol/L (ref 101–111)
Creatinine, Ser: 1.72 mg/dL — ABNORMAL HIGH (ref 0.44–1.00)
GFR calc Af Amer: 32 mL/min — ABNORMAL LOW (ref >60)
GFR calc non Af Amer: 27 mL/min — ABNORMAL LOW (ref >60)
Glucose, Bld: 136 mg/dL — ABNORMAL HIGH (ref 65–99)
Potassium: 3.9 mmol/L (ref 3.5–5.1)
Sodium: 138 mmol/L (ref 135–145)

## 2017-01-16 LAB — RAPID URINE DRUG SCREEN, HOSP PERFORMED
Amphetamines: NOT DETECTED
Barbiturates: NOT DETECTED
Benzodiazepines: NOT DETECTED
Cocaine: NOT DETECTED
Opiates: NOT DETECTED
Tetrahydrocannabinol: NOT DETECTED

## 2017-01-16 LAB — URINALYSIS, ROUTINE W REFLEX MICROSCOPIC
Bilirubin Urine: NEGATIVE
Glucose, UA: NEGATIVE mg/dL
Ketones, ur: NEGATIVE mg/dL
Leukocytes, UA: NEGATIVE
Nitrite: NEGATIVE
Protein, ur: NEGATIVE mg/dL
Specific Gravity, Urine: 1.01 (ref 1.005–1.030)
pH: 5 (ref 5.0–8.0)

## 2017-01-16 LAB — CBC
HCT: 34.2 % — ABNORMAL LOW (ref 36.0–46.0)
Hemoglobin: 11.7 g/dL — ABNORMAL LOW (ref 12.0–15.0)
MCH: 24.1 pg — ABNORMAL LOW (ref 26.0–34.0)
MCHC: 34.2 g/dL (ref 30.0–36.0)
MCV: 70.4 fL — ABNORMAL LOW (ref 78.0–100.0)
Platelets: 206 10*3/uL (ref 150–400)
RBC: 4.86 MIL/uL (ref 3.87–5.11)
RDW: 14.4 % (ref 11.5–15.5)
WBC: 11.7 10*3/uL — ABNORMAL HIGH (ref 4.0–10.5)

## 2017-01-16 LAB — GLUCOSE, CAPILLARY
Glucose-Capillary: 169 mg/dL — ABNORMAL HIGH (ref 65–99)
Glucose-Capillary: 218 mg/dL — ABNORMAL HIGH (ref 65–99)
Glucose-Capillary: 154 mg/dL — ABNORMAL HIGH (ref 65–99)

## 2017-01-16 MED ORDER — GADOBENATE DIMEGLUMINE 529 MG/ML IV SOLN
10.0000 mL | Freq: Once | INTRAVENOUS | Status: AC
  Administered 2017-01-16: 10 mL via INTRAVENOUS

## 2017-01-16 MED ORDER — ENOXAPARIN SODIUM 40 MG/0.4ML ~~LOC~~ SOLN
40.0000 mg | SUBCUTANEOUS | Status: DC
  Administered 2017-01-16 – 2017-01-17 (×2): 40 mg via SUBCUTANEOUS
  Filled 2017-01-16 (×2): qty 0.4

## 2017-01-16 NOTE — Progress Notes (Signed)
Neurology progress note  Subjective: No new complaints. Mental status much improved.  Exam: Vitals:   01/16/17 0516 01/16/17 0949  BP: 111/60 (!) 101/55  Pulse: 63 85  Resp: 18 17  Temp: 98 F (36.7 C) 97.7 F (36.5 C)   Examination Alert awake oriented x2. She knew the president. She could name the city she was in, state and county. Naming, comprehension and repetition was intact. Cranial nerve exam: Pupils were equal round reactive to light, extraocular movements were intact, visual fields were full to threat, face was grossly symmetric although she is edentulous. Motor exam: Symmetric 4/5 strength in all 4 extremities. Sensory exam: Was a little inconsistent but she seemed to have some decreased sensation on the left face compared to the right and also on the left arm and leg compared to the right. DTRs: 2+ all over with downgoing toes. Coordination: No dysmetria noted on finger-nose-finger Gait was not tested  Labs reviewed - repeat TSH - 0.44 EEG done, generalized slowing suggestive of encephalopathy. No electrographic abnormalities. MRI done. Patient very uncooperative for the MRI. Most underlying MRI images. No gross bleed or ischemia. The right frontal area of hypodensity seen on CT scan, as visualized as an area of possible old stroke on the MRI  Assessment This is a 76 year old with multiple vascular risk factors and a history of gradual decline in her cognitive abilities over the past few years who was brought in for evaluation of a 5 minute episode of whole-body jerking and neck deviating to the left. Following the event, she was confused and very sleepy. Today she was nearly back to baseline per family. Imaging of the brain done as a part of the workup of altered mental status showed a right frontal hypodensity on her CT scan which is new since 2016.  MRI shows no acute ischemia or bleed. This probably is a stroke that happened between 2016 and now. This area could  represent a focus for seizure and the presence of this area makes her more susceptible for having seizures.  Impression Seizure secondary to the right frontal lesion. Toxic metabolic encephalopathy Less likely syncope  Recommendations: Keppra 500 BID Neurontin per primary team. Follow up with PCP and Outpatient Neurology. Seizure precautions. No driving per [facility] law unless seizure free for 43mo. Plan relayed to Primary team resident over the phone. Please call with questions   Brittany Portland, MD [facility] [phone]  If 7pm to 7am, please call on call as listed on AMION.

## 2017-01-16 NOTE — Progress Notes (Signed)
  Speech Language Pathology Treatment and Discharge Summary: Dysphagia  Patient Details Name: Brittany Roberts MRN: [id number] DOB: 01-22-1941 Today's Date: 01/16/2017 Time: 9643-8381 SLP Time Calculation (min) (ACUTE ONLY): 15 min  Assessment / Plan / Recommendation Clinical Impression  Patient seen today for dysphagia follow-up for diet tolerance. Affect positive this morning and patient is alert, cooperative with all tasks. SLP assisted pt with performing oral care and provided education re: oral hygiene to reduce risks for aspiration. SLP also assisted pt with readjusting her position to upright to maximize safety for PO intake. SLP assessed pt's oral preparation, control and clearance for regular solids, which appears adequate. Airway protection appears adequate for thin liquids and solids. Given that she is edentulous and stated preference for softer foods, recommend continuing dys 3 diet with thin liquids, which she appears to be tolerating well. At this time goals met and no further skilled ST services are recommended. Pt in agreement. SLP will s/o.    HPI HPI: Brittany Roberts is a 77 y.o. female presenting with seizure-like activity and AMS. PMH is significant for COPD, schizophrenia, HTN, Type II DM, HLD, GERD, depression, insomnia, hypothyroidism, SLE, chronic pain, hx TIA. Head Ct with right frontal lesion with ex vacuo changes new from 2016 c/w old CVA. MD notes possible vascular or mixed dementia, paranoid thoughts may be related to behavioral and psychological symptoms of dementia or preceding major psychiatric disorder.      SLP Plan  Discharge SLP treatment due to (comment) (goals met)       Recommendations  Diet recommendations: Dysphagia 3 (mechanical soft);Thin liquid Liquids provided via: Cup;Straw Medication Administration: Whole meds with liquid Supervision: Patient able to self feed Compensations: Slow rate;Small sips/bites Postural Changes and/or Swallow Maneuvers: Seated  upright 90 degrees                Oral Care Recommendations: Oral care BID Follow up Recommendations: None SLP Visit Diagnosis: Dysphagia, unspecified (R13.10) Plan: Discharge SLP treatment due to (comment) (goals met)       [place], [place], [place] Pathologist [place] 01/16/2017, 10:53 AM

## 2017-01-16 NOTE — Progress Notes (Signed)
Family Medicine Teaching Service Daily Progress Note Intern Pager: [phone]  Patient name: Brittany Roberts Medical record number: [id number] Date of birth: 05-05-1941 Age: 76 y.o. Gender: female  Primary Care Provider: Zenia Resides, MD Consultants: Neurology, PT/OT/SLP Code Status: DNR  Pt Overview and Major Events to Date:  Brittany Roberts is a 76 y.o. Female presenting with seizure like activity and AMS. Patient was admitted to [facility] on 01/13/17.   Assessment and Plan: Brittany Roberts a 76 y.o.femalepresenting with seizure-like activity and AMS. PMH is significant for COPD, schizophrenia, HTN, Type II DM, HLD, GERD, depression, insomnia, hypothyroidism, SLE, chronic pain, hx TIA.   Altered mental status: Improving. Possibly due to medication, dementia, worsening schizophrenia, or post-ictal state. Patient had recent medication change (addition of Seroquel two weeks ago and decrease of dose of Cymbalta), and is also taking multiple other medications that could affect mental status (gabapentin, trazodone, tramadol) - neurology following, appreciate recommendations - MRI ordered, pending - vitals per floor - Neuro checks q4 hours - Hold home Seroquel, Cymbalta, trazodone, and tramadol - continue home gabapentin at reduced dose, 100 mg bid - UA pending - UDS pending - PT/OT consulted  Seizure-like activity: has not recurred. EEG unremarkable.  - neurology following, appreciate recommendations - MRI as above - continue keppra - seizure precautions  - Ativan 1mg  prn seizure activity   Acute on Chronic Kidney disease: Cr 1.22 on admission > 1.72 - Continue 100 cc/hr NS  Schizophrenia: Started on Seroquel 25mg  qhs on 12/23/16 due to reports of auditory hallucinations. Daughter notes increased paranoia and visual hallucinations since beginning Seroquel - Discontinue Seroquel  - Consider sitter if patient becomes agitated  COPD: stable. - budesonide bid  - Continuous pulse ox - O2 PRN  at night  CHF: stable.  - continue lasix 20 mg daily - monitor fluid status  - continue metoprolol XR at 12.5 mg daily  Type II DM with neuropathy:Takes NPH 30U every AM, gabapentin for neuropathy. Last A1C 7.6 on 12/28/16. AM CBG 169. - SSI - continue NPH at 15 units, titrate up as needed for high CBG - gabapentin 100 mg bid - monitor CBG AC/HS  Depression- stable, on cymbalta at home, recently added seroquel to regimen - Hold home medications in setting of acute AMS  HTN: Stable. on amlodipine, lisinopril, and metoprolol at home. BP this am 111/60 - Hold home BP meds until neurology has consulted  - appreciate neurology recommendations for permissive HTN  GERD: stable - continue home protonix   Hypothyroidism:On Synthroid. Last TSH 0.36 on 09/08/16.  - continue home synthroid  - repeat TSH normal  Insomnia: chronic, on trazodone at home, could be contributing to AMS - Hold home trazodone  Chronic pain: Secondary to SLE per family report.  - Family is interested in seeing rheumatology as outpatient.  - Hold home tramadol  HLD: Last lipid panel wnl on 07/2014.  - continue atorvastatin 20 mg   FEN/GI: Dys 3 diet, protonix PPx: lovenox   Disposition: pending clinical improvement, PT/OT eval  Subjective:  Brittany Roberts is sleeping this morning but does awaken to her name being called and light stimulation, denies pain anywhere. Cannot state why she is in hospital "something happened to me". No complaints.   Objective: Temp:  [97.7 F (36.5 C)-98.7 F (37.1 C)] 98 F (36.7 C) (07/15 0516) Pulse Rate:  [63-96] 63 (07/15 0516) Resp:  [16-20] 18 (07/15 0516) BP: (99-139)/(45-93) 111/60 (07/15 0516) SpO2:  [95 %-99 %] 95 % (  07/15 0516) Physical Exam: General: asleep but awakens easily, in NAD Cardiovascular: RRR, no MRG Respiratory: CTAB, no wheezing, rales, or rhonchi  Abdomen: soft, non distended, +BS Extremities: no edema Neuro: AOx3, follows commands,  unable to perform full neuro assessment due to patient falling asleep  Laboratory:  Recent Labs Lab 01/13/17 1200 01/14/17 0404 01/16/17 0406  WBC 13.5* 14.0* 11.7*  HGB 12.7 13.2 11.7*  HCT 37.5 38.1 34.2*  PLT 244 219 206    Recent Labs Lab 01/13/17 1200 01/14/17 0404 01/15/17 0700 01/16/17 0406  NA 136 139 138 138  K 4.2 4.2 4.1 3.9  CL 104 107 108 109  CO2 20* 18* 19* 20*  BUN 7 9 15  22*  CREATININE 1.22* 1.22* 1.68* 1.72*  CALCIUM 9.5 9.0 8.5* 8.3*  PROT 8.6*  --   --   --   BILITOT 0.8  --   --   --   ALKPHOS 132*  --   --   --   ALT 15  --   --   --   AST 32  --   --   --   GLUCOSE 311* 226* 243* 136*    Drugs of Abuse  No results found for: LABOPIA, COCAINSCRNUR, LABBENZ, AMPHETMU, THCU, LABBARB   TSH 0.404  Imaging/Diagnostic Tests:  Dg Chest 2 View Result Date: 01/13/2017 IMPRESSION: Cardiomegaly.  No evidence of CHF.  Ct Head Wo Contrast Result Date: 01/13/2017  IMPRESSION: 1. Focal hypo attenuation posterior right frontal lobe is new since the 2016 exams and probably related to interval nonacute infarct given subtle ex vacuo dilatation right frontal horn. There is some preservation of the overlying cortex and although vasogenic edema is considered less likely, MRI of the brain is suggested for more definitive characterization. 2. Diffuse atrophy with chronic small vessel white matter ischemic disease. 3.  Ct Angio Chest Pe W And/or Wo Contrast Result Date: 01/13/2017  IMPRESSION: 1. Examination is somewhat limited by breathing motion artifact and body habitus. 2. No definite CT findings for pulmonary embolism. 3. Normal caliber thoracic aorta. Moderate atherosclerotic calcifications. Three-vessel coronary artery calcifications. 4. No significant pulmonary findings. 5. No significant upper abdominal findings. Aortic Atherosclerosis (ICD10-I70.0).   Steve Rattler, DO 01/16/2017, 8:40 AM PGY-2, [place] Intern pager: [phone],  text pages welcome

## 2017-01-17 DIAGNOSIS — R569 Unspecified convulsions: Secondary | ICD-10-CM

## 2017-01-17 LAB — BASIC METABOLIC PANEL
Anion gap: 7 (ref 5–15)
BUN: 17 mg/dL (ref 6–20)
CO2: 24 mmol/L (ref 22–32)
Calcium: 8.2 mg/dL — ABNORMAL LOW (ref 8.9–10.3)
Chloride: 106 mmol/L (ref 101–111)
Creatinine, Ser: 1.29 mg/dL — ABNORMAL HIGH (ref 0.44–1.00)
GFR calc Af Amer: 45 mL/min — ABNORMAL LOW (ref >60)
GFR calc non Af Amer: 39 mL/min — ABNORMAL LOW (ref >60)
Glucose, Bld: 133 mg/dL — ABNORMAL HIGH (ref 65–99)
Potassium: 3.9 mmol/L (ref 3.5–5.1)
Sodium: 137 mmol/L (ref 135–145)

## 2017-01-17 LAB — GLUCOSE, CAPILLARY
Glucose-Capillary: 129 mg/dL — ABNORMAL HIGH (ref 65–99)
Glucose-Capillary: 239 mg/dL — ABNORMAL HIGH (ref 65–99)
Glucose-Capillary: 152 mg/dL — ABNORMAL HIGH (ref 65–99)
Glucose-Capillary: 193 mg/dL — ABNORMAL HIGH (ref 65–99)

## 2017-01-17 LAB — CBC
HCT: 34.1 % — ABNORMAL LOW (ref 36.0–46.0)
Hemoglobin: 11.6 g/dL — ABNORMAL LOW (ref 12.0–15.0)
MCH: 24.1 pg — ABNORMAL LOW (ref 26.0–34.0)
MCHC: 34 g/dL (ref 30.0–36.0)
MCV: 70.9 fL — ABNORMAL LOW (ref 78.0–100.0)
Platelets: 216 10*3/uL (ref 150–400)
RBC: 4.81 MIL/uL (ref 3.87–5.11)
RDW: 14.4 % (ref 11.5–15.5)
WBC: 12.1 10*3/uL — ABNORMAL HIGH (ref 4.0–10.5)

## 2017-01-17 MED ORDER — LEVETIRACETAM 500 MG PO TABS: 500.0000 mg | ORAL_TABLET | Freq: Two times a day (BID) | ORAL | 0 refills | Status: DC

## 2017-01-17 NOTE — Progress Notes (Signed)
Physical Therapy Treatment Patient Details Name: Brittany Roberts MRN: [id number] DOB: 1941/04/29 Today's Date: 01/17/2017    History of Present Illness Pt is a 76 y/o female admitted secondary to likely tonic-clonic seizure activity. PMH including but not limited to COPD, CHF, CAD, CKD, DM, HLD, HTN, SLE and schizophrenia.    PT Comments    Pt seen for mobility progression. Improved activity tolerance and ambulation noted this session. Continues to required min guard to min assist for safety and stability. Some confusion during session but no impacting function at this time. Continue to agree that SNF remains appropriate. Will continue to see and progress as tolerated.   Follow Up Recommendations  SNF     Equipment Recommendations  None recommended by PT    Recommendations for Other Services       Precautions / Restrictions Precautions Precautions: Fall;Other (comment) (seizure precautions) Restrictions Weight Bearing Restrictions: No    Mobility  Bed Mobility Overal bed mobility: Needs Assistance Bed Mobility: Supine to Sit     Supine to sit: Min assist;HOB elevated Sit to supine: Min assist   General bed mobility comments: min assist for elevation of trunk to upright, increased time to perform. Assist to elevate LEs back to bed and reposition  Transfers Overall transfer level: Needs assistance Equipment used: Rolling walker (2 wheeled) Transfers: Sit to/from Stand Sit to Stand: Min assist         General transfer comment: min assist to power up to standing, VCs for hand placement and safety. increased effort to power up.  Ambulation/Gait Ambulation/Gait assistance: Min guard;Min assist Ambulation Distance (Feet): 90 Feet Assistive device: Rolling walker (2 wheeled) Gait Pattern/deviations: Step-through pattern;Decreased stride length Gait velocity: decreased Gait velocity interpretation: Below normal speed for age/gender General Gait Details: 2 episodes of min  assist for stability. tactile cues for pacing. heavy reliance on Rw   Stairs            Wheelchair Mobility    Modified Rankin (Stroke Patients Only)       Balance Overall balance assessment: Needs assistance Sitting-balance support: Feet supported Sitting balance-Leahy Scale: Fair Sitting balance - Comments: min guard to sit EOB   Standing balance support: No upper extremity supported;During functional activity Standing balance-Leahy Scale: Poor Standing balance comment: relaint on RW during ambulation, leans against sink for balance during ADL                            Cognition Arousal/Alertness: Awake/alert Behavior During Therapy: WFL for tasks assessed/performed Overall Cognitive Status: Within Functional Limits for tasks assessed Area of Impairment: Orientation;Memory;Following commands;Safety/judgement;Problem solving                 Orientation Level: Disoriented to;Time;Situation   Memory: Decreased recall of precautions;Decreased short-term memory Following Commands: Follows one step commands inconsistently;Follows one step commands with increased time Safety/Judgement: Decreased awareness of safety;Decreased awareness of deficits   Problem Solving: Requires verbal cues;Requires tactile cues General Comments: patient with intermittent confusion during session, but able to follow commands and perform tasks consistently      Exercises      General Comments        Pertinent Vitals/Pain Faces Pain Scale: No hurt    Home Living                      Prior Function            PT Goals (current  goals can now be found in the care plan section) Acute Rehab PT Goals Patient Stated Goal: to get stronger PT Goal Formulation: With patient/family Time For Goal Achievement: 01/28/17 Potential to Achieve Goals: Good Progress towards PT goals: Progressing toward goals    Frequency    Min 3X/week      PT Plan Current  plan remains appropriate    Co-evaluation              AM-PAC PT "6 Clicks" Daily Activity  Outcome Measure  Difficulty turning over in bed (including adjusting bedclothes, sheets and blankets)?: Total Difficulty moving from lying on back to sitting on the side of the bed? : Total Difficulty sitting down on and standing up from a chair with arms (e.g., wheelchair, bedside commode, etc,.)?: Total Help needed moving to and from a bed to chair (including a wheelchair)?: A Little Help needed walking in hospital room?: A Little Help needed climbing 3-5 steps with a railing? : A Lot 6 Click Score: 11    End of Session Equipment Utilized During Treatment: Gait belt Activity Tolerance: Patient tolerated treatment well Patient left: in bed;with call bell/phone within reach;with bed alarm set;with family/visitor present Nurse Communication: Mobility status PT Visit Diagnosis: Unsteadiness on feet (R26.81);Other abnormalities of gait and mobility (R26.89)     Time: 1657-9038 PT Time Calculation (min) (ACUTE ONLY): 19 min  Charges:  $Gait Training: 8-22 mins                    G Codes:       Alben Deeds, PT DPT N[facility] [phone]    Duncan Dull 01/17/2017, 2:43 PM

## 2017-01-17 NOTE — Discharge Summary (Signed)
[facility] Discharge Summary  Patient name: Brittany Roberts: [id number] Date of birth: 1941/01/27 Age: 76 y.o. Gender: female Date of Admission: 01/13/2017  Date of Discharge: 01/18/2017 Admitting Physician: Blane Ohara McDiarmid, MD  Primary Care Provider: Zenia Resides, MD Consultants: Neurology, PT/OT/SLP  Indication for Hospitalization: AMS and seizure like activity  Discharge Diagnoses/Problem List:  AMS Seizure like activity  Acute on Chronic Kidney Disease Schizophrenia COPD CHF Type 2 DM w/ neuropathy  Depression  HTN GERD Hypothyroidism  Insomnia Chronic pain  HLD  Disposition: SNF  Discharge Condition: stable, improved   Discharge Exam:  General: awake and alert, sitting up in bed, NAD, alert and oriented x 3 Cardiovascular: RRR, no MRG Respiratory: CTAB, no wheezes, rales, or rhonchi  Abdomen: soft, non tender, non distended, bowel sounds x 4  Extremities: no edema, normal range of motion  Neuro: alert and oriented x 3, CN 2-12 intact, 5+ muscle strength, sensation intact bilaterally   Brief Hospital Course:  Brittany Roberts is a 76 y.o. Female who presented with seizure like activity and AMS on 01/13/2017. PMH is significant for COPD, schizophrenia, HTN, Type II DM, HLD, GERD, depression, insomnia, hypothyroidism, SLE, chronic pain, hx TIA. Patient was taken off of home trazodone, tramadol, Cymbalta, and Seroquel. Neurology was consulted and patient began Keppra 500 mg bid. Patient showed clinic improvement and was oriented x3 following medication changes. MRI showed possible seizure foci of encephalomalacia of the right frontal operculum. EEG showed background slowing. UDS and UA were negative. Patient did not have seizure like activity while admitted. Cr improved from 1.72 to 1.29 throughout admission with normal saline. Patient had intermittent pain in back and legs, but denied pain on my physical exam.   Issues for  Follow Up:  1. Follow up with PCP for assessment of medications. Have discontinued trazodone, tramadol, cymbalta, and sweroquel.  2. Follow up with outpatient neurology  3. Do not drive until seizure free for 6 months   Significant Procedures: EEG  Significant Labs and Imaging:   Recent Labs Lab 01/16/17 0406 01/17/17 0416 01/18/17 0554  WBC 11.7* 12.1* 11.3*  HGB 11.7* 11.6* 12.0  HCT 34.2* 34.1* 35.4*  PLT 206 216 211    Recent Labs Lab 01/13/17 1200 01/14/17 0404 01/15/17 0700 01/16/17 0406 01/17/17 0416 01/18/17 0554  NA 136 139 138 138 137 135  K 4.2 4.2 4.1 3.9 3.9 3.8  CL 104 107 108 109 106 105  CO2 20* 18* 19* 20* 24 23  GLUCOSE 311* 226* 243* 136* 133* 182*  BUN 7 9 15  22* 17 15  CREATININE 1.22* 1.22* 1.68* 1.72* 1.29* 1.25*  CALCIUM 9.5 9.0 8.5* 8.3* 8.2* 8.4*  ALKPHOS 132*  --   --   --   --   --   AST 32  --   --   --   --   --   ALT 15  --   --   --   --   --   ALBUMIN 3.6  --   --   --   --   --     Urinalysis    Component Value Date/Time   COLORURINE YELLOW 01/16/2017 1539   APPEARANCEUR CLEAR 01/16/2017 1539   LABSPEC 1.010 01/16/2017 1539   PHURINE 5.0 01/16/2017 1539   GLUCOSEU NEGATIVE 01/16/2017 1539   HGBUR SMALL (A) 01/16/2017 1539   HGBUR negative 02/26/2009 1314   BILIRUBINUR NEGATIVE 01/16/2017 1539   BILIRUBINUR NEG  04/05/2014 1455   [place] 01/16/2017 1539   PROTEINUR NEGATIVE 01/16/2017 1539   UROBILINOGEN 0.2 08/01/2014 2305   NITRITE NEGATIVE 01/16/2017 1539   LEUKOCYTESUR NEGATIVE 01/16/2017 1539    Drugs of Abuse     Component Value Date/Time   LABOPIA NONE DETECTED 01/16/2017 1540   COCAINSCRNUR NONE DETECTED 01/16/2017 1540   LABBENZ NONE DETECTED 01/16/2017 1540   AMPHETMU NONE DETECTED 01/16/2017 1540   THCU NONE DETECTED 01/16/2017 1540   LABBARB NONE DETECTED 01/16/2017 1540      Results/Tests Pending at Time of Discharge: none  Discharge Medications:  Allergies as of 01/18/2017       Reactions   Ace Inhibitors Other (See Comments)   Coincided with sig bump in creat. Retried and creat bumped again.   Bidil [isosorb Dinitrate-hydralazine] Other (See Comments)   Sleep all the time.   Buspar [buspirone] Other (See Comments)   pain   Pregabalin Swelling   Ropinirole Hydrochloride Swelling   Amantadines Rash   Penicillins Swelling, Rash   Has patient had a PCN reaction causing immediate rash, facial/tongue/throat swelling, SOB or lightheadedness with hypotension: Yes Has patient had a PCN reaction causing severe rash involving mucus membranes or skin necrosis: Yes Has patient had a PCN reaction that required hospitalization: Yes Has patient had a PCN reaction occurring within the last 10 years: No If all of the above answers are "NO", then may proceed with Cephalosporin use.      Medication List    STOP taking these medications   DULoxetine 30 MG capsule Commonly known as:  CYMBALTA   QUEtiapine 25 MG tablet Commonly known as:  SEROQUEL   traMADol 50 MG tablet Commonly known as:  ULTRAM   traZODone 50 MG tablet Commonly known as:  DESYREL     TAKE these medications   albuterol 108 (90 Base) MCG/ACT inhaler Commonly known as:  PROAIR HFA inhale 2 puffs by mouth every 4 hours if needed USE ONLY IF YOU ARE WHEEZING   amLODipine 10 MG tablet Commonly known as:  NORVASC Take 1 tablet (10 mg total) by mouth daily.   aspirin EC 81 MG tablet Take 162 mg by mouth daily.   atorvastatin 20 MG tablet Commonly known as:  LIPITOR Take 1 tablet (20 mg total) by mouth daily.   B COMPLETE Tabs Take 1 tablet by mouth daily.   FLAXSEED OIL PO Take 1 capsule by mouth daily.   fluticasone 220 MCG/ACT inhaler Commonly known as:  FLOVENT HFA inhale 1 puff by mouth twice a day   furosemide 20 MG tablet Commonly known as:  LASIX Take 1 tablet (20 mg total) by mouth daily.   gabapentin 300 MG capsule Commonly known as:  NEURONTIN Take 1 capsule (300 mg total) by  mouth 2 (two) times daily.   guaiFENesin 600 MG 12 hr tablet Commonly known as:  MUCINEX Take 1 tablet (600 mg total) by mouth 2 (two) times daily.   Insulin NPH (Human) (Isophane) 100 UNIT/ML Kiwkpen Commonly known as:  HUMULIN N KWIKPEN Inject 30 Units into the skin every morning.   levETIRAcetam 500 MG tablet Commonly known as:  KEPPRA Take 1 tablet (500 mg total) by mouth 2 (two) times daily.   levothyroxine 25 MCG tablet Commonly known as:  SYNTHROID, LEVOTHROID Take 25 mcg by mouth daily.   lisinopril 10 MG tablet Commonly known as:  PRINIVIL,ZESTRIL Take 10 mg by mouth daily.   metFORMIN 500 MG tablet Commonly known as:  GLUCOPHAGE  Take 1 tablet (500 mg total) by mouth 2 (two) times daily with a meal.   metoprolol succinate 50 MG 24 hr tablet Commonly known as:  TOPROL-XL TAKE ONE TABLET BY MOUTH ONCE DAILY WITH OR IMMEDIATELY FOLLOWING A MEAL   mometasone 0.1 % ointment Commonly known as:  ELOCON Apply topically daily. What changed:  how much to take  when to take this  reasons to take this   multivitamin with minerals Tabs tablet Take 1 tablet by mouth daily.   nitroGLYCERIN 0.4 MG SL tablet Commonly known as:  NITROSTAT Place 1 tablet (0.4 mg total) under the tongue every 5 (five) minutes as needed for chest pain.   pantoprazole 40 MG tablet Commonly known as:  PROTONIX Take 1 tablet (40 mg total) by mouth 2 (two) times daily.   PEN NEEDLES 31GX5/16" 31G X 8 MM Misc Check Blood sugar three times per day. Dx insulin using E11.8   psyllium 28 % packet Commonly known as:  METAMUCIL SMOOTH TEXTURE Take 1 packet by mouth 2 (two) times daily.   VITAMIN C PO Take 1 tablet by mouth daily.   Vitamin D3 5000 units Caps Take 5,000 Units by mouth daily.       Discharge Instructions: Please refer to Patient Instructions section of EMR for full details.  Patient was counseled important signs and symptoms that should prompt return to medical care,  changes in medications, dietary instructions, activity restrictions, and follow up appointments.   Follow-Up Appointments: Follow-up Information    Hensel, Jamal Collin, MD. Go on 01/19/2017.   Specialty:  Family Medicine Why:  Follow up appointment @ 2pm with Dr. Huntley Dec information: [place] [address] [phone]           Caroline More, [place] 01/18/2017, 12:31 PM PGY-1, [place]

## 2017-01-17 NOTE — Progress Notes (Signed)
Family Medicine Teaching Service Daily Progress Note Intern Pager: [phone]  Patient name: Brittany Roberts Medical record number: [id number] Date of birth: 1940-11-01 Age: 76 y.o. Gender: female  Primary Care Provider: Zenia Resides, MD Consultants: Neurology, PT/OT/SLP Code Status: DNR  Pt Overview and Major Events to Date:  Brittany Roberts is a 76 y.o. Female presenting with seizure like activity and AMS. Patient was admitted to [facility] on 01/13/17.   Assessment and Plan: Brittany Roberts a 76 y.o.femalepresenting with seizure-like activity and AMS. PMH is significant for COPD, schizophrenia, HTN, Type II DM, HLD, GERD, depression, insomnia, hypothyroidism, SLE, chronic pain, hx TIA.   Altered mental status:  Improved. Possibly due to medication, dementia, worsening schizophrenia, or post-ictal state. Patient had recentmedication change (addition of Seroquel two weeks ago and decrease of dose of Cymbalta), and is also taking multiple other medications that could affect mental status (gabapentin, trazodone, tramadol). Neurology recommendations to continue Keppra and gabapentin and to follow up as outpatient. MRI showing possible seizure foci of ecnephalomalacia of the right frontal operculum. EEG showing background slowing. UDS was negative. UA was negative. Patient was oriented to person, place and time. Neuro exam was wnl.  - neurology following, appreciate recommendations - vitals per floor - Neuro checks q4 hours - Hold home Seroquel, Cymbalta, trazodone, and tramadol - continue home gabapentin 100 mg bid - appreciate PT/OT recommendations   Seizure-like activity:  Has not recurred. EEG showing background slowing. MRI showing possible seizure foci of ecnephalomalacia of the right frontal operculum. - neurology following, appreciate recommendations - continue keppra - seizure precautions  - Ativan 1mg  prn seizure activity   Acute on Chronic Kidney disease:  Cr 1.29 on admission > 1.72 -  Continue 100 cc/hr NS  Schizophrenia:  Started on Seroquel 25mg  qhs on 12/23/16 due to reports of auditory hallucinations. Daughter notes increased paranoia and visual hallucinations since beginning Seroquel - Discontinue Seroquel  - Consider sitter if patient becomes agitated  COPD:  stable. - budesonide bid  - Continuous pulse ox - O2 PRN at night  CHF:  stable.  - continue lasix 20 mg daily - monitor fluid status  - continue metoprolol XR at 12.5 mg daily  Type II DM with neuropathy: Takes NPH 30U every AM, gabapentin for neuropathy. Last A1C 7.6 on 12/28/16. AM CBG 129. - SSI - continue NPH at 15 units, titrate up as needed for high CBG - gabapentin 100 mg bid - monitor CBG AC/HS  Depression stable, on cymbalta at home, recently added seroquel to regimen - Hold home medications in setting of acute AMS  HTN:  Stable. on amlodipine, lisinopril, and metoprolol at home. BP this am 117/64 - Hold home BP meds until neurology has consulted  - appreciate neurology recommendations for permissive HTN  GERD:  stable - continue home protonix   Hypothyroidism: On Synthroid. Last TSH 0.36 on 09/08/16.  - continue home synthroid  - repeat TSH normal  Insomnia:  chronic, on trazodone at home, could be contributing to AMS - Hold home trazodone  Chronic pain:  Secondary to SLE per family report.  - Family is interested in seeing rheumatology as outpatient.  - Hold home tramadol  HLD: Last lipid panel wnl on01/2016.  - continue atorvastatin 20 mg   FEN/GI: Dys 3 diet, protonix  PPx: lovenox   Disposition: discharge to SNF  Subjective:  Brittany Archibeque Kingis a 76 y.o.femalepresenting with seizure-like activity and AMS. PMH is significant for COPD, schizophrenia, HTN, Type II DM,  HLD, GERD, depression, insomnia, hypothyroidism, SLE, chronic pain, hx TIA. Patient is currently feeling better and stable. Patient was oriented to person, place, and time. Patient  denies dizziness and chest pain. Patient states chills last night and slight shortness of breath. Daughter notes increased agitation but only while asleep.   Objective: Temp:  [97.7 F (36.5 C)-98.4 F (36.9 C)] 98.1 F (36.7 C) (07/16 0441) Pulse Rate:  [79-89] 89 (07/16 0441) Resp:  [16-20] 18 (07/16 0441) BP: (101-128)/(55-69) 117/64 (07/16 0441) SpO2:  [96 %-100 %] 99 % (07/16 0441) Physical Exam: General: well appearing, NAD, lying in bed, awake, alert and oriented to person, place, and time Cardiovascular: RRR, no MRG Respiratory: CTAB, no wheezes, rales, or rhonchi Abdomen: soft, slight tenderness in hypogastric region, non distended Extremities: no edema, full range of motion  Neuro: CN2-12 intact, 5+ muscle strength, sensation intact bilaterally   Laboratory:  Recent Labs Lab 01/14/17 0404 01/16/17 0406 01/17/17 0416  WBC 14.0* 11.7* 12.1*  HGB 13.2 11.7* 11.6*  HCT 38.1 34.2* 34.1*  PLT 219 206 216    Recent Labs Lab 01/13/17 1200  01/15/17 0700 01/16/17 0406 01/17/17 0416  NA 136  < > 138 138 137  K 4.2  < > 4.1 3.9 3.9  CL 104  < > 108 109 106  CO2 20*  < > 19* 20* 24  BUN 7  < > 15 22* 17  CREATININE 1.22*  < > 1.68* 1.72* 1.29*  CALCIUM 9.5  < > 8.5* 8.3* 8.2*  PROT 8.6*  --   --   --   --   BILITOT 0.8  --   --   --   --   ALKPHOS 132*  --   --   --   --   ALT 15  --   --   --   --   AST 32  --   --   --   --   GLUCOSE 311*  < > 243* 136* 133*  < > = values in this interval not displayed.  Urinalysis    Component Value Date/Time   COLORURINE YELLOW 01/16/2017 1539   APPEARANCEUR CLEAR 01/16/2017 1539   LABSPEC 1.010 01/16/2017 1539   PHURINE 5.0 01/16/2017 1539   GLUCOSEU NEGATIVE 01/16/2017 1539   HGBUR SMALL (A) 01/16/2017 1539   HGBUR negative 02/26/2009 1314   BILIRUBINUR NEGATIVE 01/16/2017 1539   BILIRUBINUR NEG 04/05/2014 1455   KETONESUR NEGATIVE 01/16/2017 1539   PROTEINUR NEGATIVE 01/16/2017 1539   UROBILINOGEN 0.2 08/01/2014  2305   NITRITE NEGATIVE 01/16/2017 1539   LEUKOCYTESUR NEGATIVE 01/16/2017 1539    Drugs of Abuse     Component Value Date/Time   LABOPIA NONE DETECTED 01/16/2017 1540   COCAINSCRNUR NONE DETECTED 01/16/2017 1540   LABBENZ NONE DETECTED 01/16/2017 1540   AMPHETMU NONE DETECTED 01/16/2017 1540   THCU NONE DETECTED 01/16/2017 1540   LABBARB NONE DETECTED 01/16/2017 1540      Imaging/Diagnostic Tests: Dg Chest 2 View  Result Date: 01/13/2017 CLINICAL DATA:  Altered mental status EXAM: CHEST  2 VIEW COMPARISON:  08/01/2010 FINDINGS: There is no focal consolidation. There are low lung volumes. There is bilateral interstitial prominence and prominence of the central pulmonary vasculature likely accentuated by low lung volumes. There is no pleural effusion or pneumothorax. There is stable cardiomegaly. The osseous structures are unremarkable. IMPRESSION: Cardiomegaly.  No evidence of CHF. Electronically Signed   By: Kathreen Devoid   On: 01/13/2017 11:55  Ct Head Wo Contrast  Result Date: 01/13/2017 CLINICAL DATA:  Seizure activity. EXAM: CT HEAD WITHOUT CONTRAST TECHNIQUE: Contiguous axial images were obtained from the base of the skull through the vertex without intravenous contrast. COMPARISON:  MRI head 02/21/2015.  Head CT 08/01/2014. FINDINGS: Brain: Hypo attenuation in the posterior right frontal lobe is probably encephalomalacia related to prior infarct although there is some preservation of the overlying cortex. No evidence for acute hemorrhage, hydrocephalus, or abnormal extra-axial fluid collection. Diffuse loss of parenchymal volume is consistent with atrophy. Patchy low attenuation in the deep hemispheric and periventricular white matter is nonspecific, but likely reflects chronic microvascular ischemic demyelination. Vascular: No hyperdense vessel or unexpected calcification. Skull: No evidence for fracture. No worrisome lytic or sclerotic lesion. Sinuses/Orbits: The visualized paranasal  sinuses and mastoid air cells are clear. Visualized portions of the globes and intraorbital fat are unremarkable. Other: None. IMPRESSION: 1. Focal hypo attenuation posterior right frontal lobe is new since the 2016 exams and probably related to interval nonacute infarct given subtle ex vacuo dilatation right frontal horn. There is some preservation of the overlying cortex and although vasogenic edema is considered less likely, MRI of the brain is suggested for more definitive characterization. 2. Diffuse atrophy with chronic small vessel white matter ischemic disease. 3. Electronically Signed   By: Misty Stanley M.D.   On: 01/13/2017 12:42   Ct Angio Chest Pe W And/or Wo Contrast  Result Date: 01/13/2017 CLINICAL DATA:  Seizures, shortness of breath, lethargy and confusion. History of sickle cell disease. EXAM: CT ANGIOGRAPHY CHEST WITH CONTRAST TECHNIQUE: Multidetector CT imaging of the chest was performed using the standard protocol during bolus administration of intravenous contrast. Multiplanar CT image reconstructions and MIPs were obtained to evaluate the vascular anatomy. CONTRAST:  100 cc Omnipaque 370 COMPARISON:  None. FINDINGS: Cardiovascular: The heart is mildly enlarged. No pericardial effusion. The aorta is normal in caliber. Mild tortuosity and moderate atherosclerotic calcifications. Coronary artery calcifications are noted. The pulmonary arterial tree is fairly well opacified. There is moderate breathing motion artifact but no definite large central pulmonary emboli. Mediastinum/Nodes: Small scattered mediastinal and hilar lymph nodes but no mass or overt adenopathy. The esophagus is grossly normal. Lungs/Pleura: Moderate breathing motion artifact. No definite acute pulmonary findings such as infiltrate, edema or effusion. No worrisome pulmonary lesions. Upper Abdomen: Small hiatal hernia noted. Partially calcified spleen. Status post cholecystectomy. Large right renal cyst. Moderate aortic  calcifications. Chest wall/ Musculoskeletal: No breast masses, supraclavicular or axillary lymphadenopathy. There is massive left-sided thyromegaly also demonstrated on prior thyroid ultrasound examinations. Moderate deviation of the trachea rightward. No significant bony findings. Review of the MIP images confirms the above findings. IMPRESSION: 1. Examination is somewhat limited by breathing motion artifact and body habitus. 2. No definite CT findings for pulmonary embolism. 3. Normal caliber thoracic aorta. Moderate atherosclerotic calcifications. Three-vessel coronary artery calcifications. 4. No significant pulmonary findings. 5. No significant upper abdominal findings. Aortic Atherosclerosis (ICD10-I70.0). Electronically Signed   By: Marijo Sanes M.D.   On: 01/13/2017 15:24   Mr Jeri Cos MG Contrast  Result Date: 01/16/2017 CLINICAL DATA:  Altered mental status.  Seizures. EXAM: MRI HEAD WITHOUT AND WITH CONTRAST TECHNIQUE: Multiplanar, multiecho pulse sequences of the brain and surrounding structures were obtained without and with intravenous contrast. CONTRAST:  MULTIHANCE GADOBENATE DIMEGLUMINE 529 MG/ML IV SOLN COMPARISON:  CT head without contrast 01/13/2017. MRI brain 02/21/2015. FINDINGS: Brain: Focal encephalomalacia is present in the right frontal operculum. There is some involvement of the  superior insular cortex. There is no associated hemorrhage. Volume loss is present with ex vacuo dilation of the right lateral ventricle. Surrounding white matter gliosis is noted. No acute infarct is present. Moderate left periventricular T2 changes are evident as well. The brainstem and cerebellum are normal. The internal auditory canals are within normal limits. Vascular: Flow is present in the major intracranial arteries. Skull and upper cervical spine: The skullbase is within normal limits. The craniocervical junction is normal. A relatively empty sella is again seen. The craniocervical junction is within  normal limits. The upper cervical spine is within normal limits. Midline sagittal structures are otherwise unremarkable. Sinuses/Orbits: There is chronic opacification of the right maxillary sinus with a shrunken sinus. Marked wall thickening is noted. No active disease is present. The paranasal sinuses and mastoid air cells are otherwise clear. IMPRESSION: 1. Encephalomalacia involving the right frontal operculum is remote. This could serve as a seizure focus. 2. No acute intracranial abnormality. 3. Other diffuse periventricular white matter changes bilaterally are mildly advanced for age. This likely reflects the sequela of chronic microvascular ischemia. 4. Chronic right maxillary sinus disease without active disease. Electronically Signed   By: San Morelle M.D.   On: 01/16/2017 13:46     Caroline More, DO 01/17/2017, 6:25 AM PGY-1, [place] Intern pager: [phone], text pages welcome

## 2017-01-17 NOTE — Discharge Instructions (Signed)
Brittany Roberts was admitted for altered mental status and seizure like activity. She was started on a new medication Keppra, to be continued and adjusted as appropriate at you're follow up appointment.   MRI of her brain showed Encephalomalacia involving the right frontal operculum which could be a possible seizure foci.   PLEASE STOP TAKING TRAZODONE, TRAMADOL,  CYMBALTA, AND SEROQUEL. These will be restarted as appropriate at you're follow up visits.

## 2017-01-17 NOTE — Care Management Important Message (Signed)
Important Message  Patient Details  Name: Brittany Roberts MRN: [id number] Date of Birth: Nov 03, 1940   Medicare Important Message Given:  Yes    Orbie Pyo 01/17/2017, 2:38 PM

## 2017-01-17 NOTE — Discharge Summary (Deleted)
[facility] Discharge Summary  Patient name: Brittany Roberts Medical record number: [id number] Date of birth: Jan 14, 1941 Age: 76 y.o. Gender: female Date of Admission: 01/13/2017  Date of Discharge: 01/17/2017 Admitting Physician: Blane Ohara McDiarmid, MD  Primary Care Provider: Zenia Resides, MD Consultants: Neurology, PT/OT/SLP  Indication for Hospitalization: AMS and seizure like activity  Discharge Diagnoses/Problem List:  AMS Seizure like activity  Acute on Chronic Kidney Disease Schizophrenia COPD CHF Type 2 DM w/ neuropathy  Depression  HTN GERD Hypothyroidism  Insomnia Chronic pain  HLD  Disposition: SNF   Discharge Condition: stable, improved   Discharge Exam:  General: well appearing, NAD, lying in bed, awake, alert and oriented to person, place, and time Cardiovascular: RRR, no MRG Respiratory: CTAB, no wheezes, rales, or rhonchi Abdomen: soft, slight tenderness in hypogastric region, non distended Extremities: no edema, full range of motion  Neuro: CN2-12 intact, 5+ muscle strength, sensation intact bilaterally   Brief Hospital Course:  Ms. Safley is a 76 y.o. Female who presented with seizure like activity and AMS on 01/13/2017. PMH is significant for COPD, schizophrenia, HTN, Type II DM, HLD, GERD, depression, insomnia, hypothyroidism, SLE, chronic pain, hx TIA. Patient was taken off of home trazodone, tramadol, Cymbalta, and Seroquel. Neurology was consulted and patient began Keppra 500 mg bid. Patient showed clinic improvement and was oriented x3 following medication changes. MRI showed possible seizure foci of encephalomalacia of the right frontal operculum. EEG showed background slowing. UDS and UA were negative. Patient did not have seizure like activity while admitted. Cr improved from 1.72 to 1.29 throughout admission with normal saline. Patient had intermittent pain in back and legs, but denied pain on my physical exam.   Issues for  Follow Up:  1. Follow up with PCP for assessment of medications. Have discontinued trazodone, tramadol, cymbalta, and sweroquel.  2. Follow up with outpatient neurology  3. Do not drive until seizure free for 6 months   Significant Procedures: EEG   Significant Labs and Imaging:   Recent Labs Lab 01/14/17 0404 01/16/17 0406 01/17/17 0416  WBC 14.0* 11.7* 12.1*  HGB 13.2 11.7* 11.6*  HCT 38.1 34.2* 34.1*  PLT 219 206 216    Recent Labs Lab 01/13/17 1200 01/14/17 0404 01/15/17 0700 01/16/17 0406 01/17/17 0416  NA 136 139 138 138 137  K 4.2 4.2 4.1 3.9 3.9  CL 104 107 108 109 106  CO2 20* 18* 19* 20* 24  GLUCOSE 311* 226* 243* 136* 133*  BUN 7 9 15  22* 17  CREATININE 1.22* 1.22* 1.68* 1.72* 1.29*  CALCIUM 9.5 9.0 8.5* 8.3* 8.2*  ALKPHOS 132*  --   --   --   --   AST 32  --   --   --   --   ALT 15  --   --   --   --   ALBUMIN 3.6  --   --   --   --    Drugs of Abuse     Component Value Date/Time   LABOPIA NONE DETECTED 01/16/2017 1540   COCAINSCRNUR NONE DETECTED 01/16/2017 1540   LABBENZ NONE DETECTED 01/16/2017 1540   AMPHETMU NONE DETECTED 01/16/2017 1540   THCU NONE DETECTED 01/16/2017 1540   LABBARB NONE DETECTED 01/16/2017 1540    Urinalysis    Component Value Date/Time   COLORURINE YELLOW 01/16/2017 1539   APPEARANCEUR CLEAR 01/16/2017 1539   LABSPEC 1.010 01/16/2017 1539   PHURINE 5.0 01/16/2017 1539  GLUCOSEU NEGATIVE 01/16/2017 1539   HGBUR SMALL (A) 01/16/2017 1539   HGBUR negative 02/26/2009 1314   BILIRUBINUR NEGATIVE 01/16/2017 1539   BILIRUBINUR NEG 04/05/2014 1455   KETONESUR NEGATIVE 01/16/2017 1539   PROTEINUR NEGATIVE 01/16/2017 1539   UROBILINOGEN 0.2 08/01/2014 2305   NITRITE NEGATIVE 01/16/2017 1539   LEUKOCYTESUR NEGATIVE 01/16/2017 1539    Dg Chest 2 View  Result Date: 01/13/2017 CLINICAL DATA:  Altered mental status EXAM: CHEST  2 VIEW COMPARISON:  08/01/2010 FINDINGS: There is no focal consolidation. There are low lung  volumes. There is bilateral interstitial prominence and prominence of the central pulmonary vasculature likely accentuated by low lung volumes. There is no pleural effusion or pneumothorax. There is stable cardiomegaly. The osseous structures are unremarkable. IMPRESSION: Cardiomegaly.  No evidence of CHF. Electronically Signed   By: Kathreen Devoid   On: 01/13/2017 11:55   Ct Head Wo Contrast  Result Date: 01/13/2017 CLINICAL DATA:  Seizure activity. EXAM: CT HEAD WITHOUT CONTRAST TECHNIQUE: Contiguous axial images were obtained from the base of the skull through the vertex without intravenous contrast. COMPARISON:  MRI head 02/21/2015.  Head CT 08/01/2014. FINDINGS: Brain: Hypo attenuation in the posterior right frontal lobe is probably encephalomalacia related to prior infarct although there is some preservation of the overlying cortex. No evidence for acute hemorrhage, hydrocephalus, or abnormal extra-axial fluid collection. Diffuse loss of parenchymal volume is consistent with atrophy. Patchy low attenuation in the deep hemispheric and periventricular white matter is nonspecific, but likely reflects chronic microvascular ischemic demyelination. Vascular: No hyperdense vessel or unexpected calcification. Skull: No evidence for fracture. No worrisome lytic or sclerotic lesion. Sinuses/Orbits: The visualized paranasal sinuses and mastoid air cells are clear. Visualized portions of the globes and intraorbital fat are unremarkable. Other: None. IMPRESSION: 1. Focal hypo attenuation posterior right frontal lobe is new since the 2016 exams and probably related to interval nonacute infarct given subtle ex vacuo dilatation right frontal horn. There is some preservation of the overlying cortex and although vasogenic edema is considered less likely, MRI of the brain is suggested for more definitive characterization. 2. Diffuse atrophy with chronic small vessel white matter ischemic disease. 3. Electronically Signed   By:  Misty Stanley M.D.   On: 01/13/2017 12:42   Ct Angio Chest Pe W And/or Wo Contrast  Result Date: 01/13/2017 CLINICAL DATA:  Seizures, shortness of breath, lethargy and confusion. History of sickle cell disease. EXAM: CT ANGIOGRAPHY CHEST WITH CONTRAST TECHNIQUE: Multidetector CT imaging of the chest was performed using the standard protocol during bolus administration of intravenous contrast. Multiplanar CT image reconstructions and MIPs were obtained to evaluate the vascular anatomy. CONTRAST:  100 cc Omnipaque 370 COMPARISON:  None. FINDINGS: Cardiovascular: The heart is mildly enlarged. No pericardial effusion. The aorta is normal in caliber. Mild tortuosity and moderate atherosclerotic calcifications. Coronary artery calcifications are noted. The pulmonary arterial tree is fairly well opacified. There is moderate breathing motion artifact but no definite large central pulmonary emboli. Mediastinum/Nodes: Small scattered mediastinal and hilar lymph nodes but no mass or overt adenopathy. The esophagus is grossly normal. Lungs/Pleura: Moderate breathing motion artifact. No definite acute pulmonary findings such as infiltrate, edema or effusion. No worrisome pulmonary lesions. Upper Abdomen: Small hiatal hernia noted. Partially calcified spleen. Status post cholecystectomy. Large right renal cyst. Moderate aortic calcifications. Chest wall/ Musculoskeletal: No breast masses, supraclavicular or axillary lymphadenopathy. There is massive left-sided thyromegaly also demonstrated on prior thyroid ultrasound examinations. Moderate deviation of the trachea rightward. No significant bony  findings. Review of the MIP images confirms the above findings. IMPRESSION: 1. Examination is somewhat limited by breathing motion artifact and body habitus. 2. No definite CT findings for pulmonary embolism. 3. Normal caliber thoracic aorta. Moderate atherosclerotic calcifications. Three-vessel coronary artery calcifications. 4. No  significant pulmonary findings. 5. No significant upper abdominal findings. Aortic Atherosclerosis (ICD10-I70.0). Electronically Signed   By: Marijo Sanes M.D.   On: 01/13/2017 15:24   Mr Jeri Cos JX Contrast  Result Date: 01/16/2017 CLINICAL DATA:  Altered mental status.  Seizures. EXAM: MRI HEAD WITHOUT AND WITH CONTRAST TECHNIQUE: Multiplanar, multiecho pulse sequences of the brain and surrounding structures were obtained without and with intravenous contrast. CONTRAST:  MULTIHANCE GADOBENATE DIMEGLUMINE 529 MG/ML IV SOLN COMPARISON:  CT head without contrast 01/13/2017. MRI brain 02/21/2015. FINDINGS: Brain: Focal encephalomalacia is present in the right frontal operculum. There is some involvement of the superior insular cortex. There is no associated hemorrhage. Volume loss is present with ex vacuo dilation of the right lateral ventricle. Surrounding white matter gliosis is noted. No acute infarct is present. Moderate left periventricular T2 changes are evident as well. The brainstem and cerebellum are normal. The internal auditory canals are within normal limits. Vascular: Flow is present in the major intracranial arteries. Skull and upper cervical spine: The skullbase is within normal limits. The craniocervical junction is normal. A relatively empty sella is again seen. The craniocervical junction is within normal limits. The upper cervical spine is within normal limits. Midline sagittal structures are otherwise unremarkable. Sinuses/Orbits: There is chronic opacification of the right maxillary sinus with a shrunken sinus. Marked wall thickening is noted. No active disease is present. The paranasal sinuses and mastoid air cells are otherwise clear. IMPRESSION: 1. Encephalomalacia involving the right frontal operculum is remote. This could serve as a seizure focus. 2. No acute intracranial abnormality. 3. Other diffuse periventricular white matter changes bilaterally are mildly advanced for age. This  likely reflects the sequela of chronic microvascular ischemia. 4. Chronic right maxillary sinus disease without active disease. Electronically Signed   By: San Morelle M.D.   On: 01/16/2017 13:46     Results/Tests Pending at Time of Discharge: none  Discharge Medications:  Allergies as of 01/17/2017      Reactions   Ace Inhibitors Other (See Comments)   Coincided with sig bump in creat. Retried and creat bumped again.   Bidil [isosorb Dinitrate-hydralazine] Other (See Comments)   Sleep all the time.   Buspar [buspirone] Other (See Comments)   pain   Pregabalin Swelling   Ropinirole Hydrochloride Swelling   Amantadines Rash   Penicillins Swelling, Rash   Has patient had a PCN reaction causing immediate rash, facial/tongue/throat swelling, SOB or lightheadedness with hypotension: Yes Has patient had a PCN reaction causing severe rash involving mucus membranes or skin necrosis: Yes Has patient had a PCN reaction that required hospitalization: Yes Has patient had a PCN reaction occurring within the last 10 years: No If all of the above answers are "NO", then may proceed with Cephalosporin use.      Medication List    STOP taking these medications   DULoxetine 30 MG capsule Commonly known as:  CYMBALTA   QUEtiapine 25 MG tablet Commonly known as:  SEROQUEL   traMADol 50 MG tablet Commonly known as:  ULTRAM   traZODone 50 MG tablet Commonly known as:  DESYREL     TAKE these medications   albuterol 108 (90 Base) MCG/ACT inhaler Commonly known as:  PROAIR HFA  inhale 2 puffs by mouth every 4 hours if needed USE ONLY IF YOU ARE WHEEZING   amLODipine 10 MG tablet Commonly known as:  NORVASC Take 1 tablet (10 mg total) by mouth daily.   aspirin EC 81 MG tablet Take 162 mg by mouth daily.   atorvastatin 20 MG tablet Commonly known as:  LIPITOR Take 1 tablet (20 mg total) by mouth daily.   B COMPLETE Tabs Take 1 tablet by mouth daily.   FLAXSEED OIL PO Take 1  capsule by mouth daily.   fluticasone 220 MCG/ACT inhaler Commonly known as:  FLOVENT HFA inhale 1 puff by mouth twice a day   furosemide 20 MG tablet Commonly known as:  LASIX Take 1 tablet (20 mg total) by mouth daily.   gabapentin 300 MG capsule Commonly known as:  NEURONTIN Take 1 capsule (300 mg total) by mouth 2 (two) times daily.   guaiFENesin 600 MG 12 hr tablet Commonly known as:  MUCINEX Take 1 tablet (600 mg total) by mouth 2 (two) times daily.   Insulin NPH (Human) (Isophane) 100 UNIT/ML Kiwkpen Commonly known as:  HUMULIN N KWIKPEN Inject 30 Units into the skin every morning.   levETIRAcetam 500 MG tablet Commonly known as:  KEPPRA Take 1 tablet (500 mg total) by mouth 2 (two) times daily.   levothyroxine 25 MCG tablet Commonly known as:  SYNTHROID, LEVOTHROID Take 25 mcg by mouth daily.   lisinopril 10 MG tablet Commonly known as:  PRINIVIL,ZESTRIL Take 10 mg by mouth daily.   metFORMIN 500 MG tablet Commonly known as:  GLUCOPHAGE Take 1 tablet (500 mg total) by mouth 2 (two) times daily with a meal.   metoprolol succinate 50 MG 24 hr tablet Commonly known as:  TOPROL-XL TAKE ONE TABLET BY MOUTH ONCE DAILY WITH OR IMMEDIATELY FOLLOWING A MEAL   mometasone 0.1 % ointment Commonly known as:  ELOCON Apply topically daily. What changed:  how much to take  when to take this  reasons to take this   multivitamin with minerals Tabs tablet Take 1 tablet by mouth daily.   nitroGLYCERIN 0.4 MG SL tablet Commonly known as:  NITROSTAT Place 1 tablet (0.4 mg total) under the tongue every 5 (five) minutes as needed for chest pain.   pantoprazole 40 MG tablet Commonly known as:  PROTONIX Take 1 tablet (40 mg total) by mouth 2 (two) times daily.   PEN NEEDLES 31GX5/16" 31G X 8 MM Misc Check Blood sugar three times per day. Dx insulin using E11.8   psyllium 28 % packet Commonly known as:  METAMUCIL SMOOTH TEXTURE Take 1 packet by mouth 2 (two) times  daily.   VITAMIN C PO Take 1 tablet by mouth daily.   Vitamin D3 5000 units Caps Take 5,000 Units by mouth daily.       Discharge Instructions: Please refer to Patient Instructions section of EMR for full details.  Patient was counseled important signs and symptoms that should prompt return to medical care, changes in medications, dietary instructions, activity restrictions, and follow up appointments.   Follow-Up Appointments: Follow-up Information    Hensel, Jamal Collin, MD. Go on 01/19/2017.   Specialty:  Family Medicine Why:  Follow up appointment @ 2pm with Dr. Huntley Dec information: [place] [address] [phone]           Caroline More, [place] 01/17/2017, 2:23 PM PGY-1, [place]

## 2017-01-17 NOTE — Consult Note (Signed)
[facility] CM Primary Care Navigator  01/17/2017  Brittany Roberts 11-Jul-1940 [id number]   Met with patient, daughter (Brittany Roberts)and other family members at the bedside to identify possible discharge needs. Daughter reports that patient had seizure-like symptoms and increased confusion thathad led to this admission. Patient endorses Dr. Madison Roberts with [facility] as the primary care provider.   Patient's daughtershared using [facility]) at [employer] to obtain medications without any problem.   Daughter reports that patient is managing her own medications at home using "pill box" system filled weekly.   Her daughter is providing transportation to her doctors'appointments or she uses Orthoptist' transportation or [facility] transportation as stated.  Patient lives alone but she has an Engineer, production from [facility] who comes 2-3 hours/ day 7 days a week and provides assistance with her care at home. Daughter is able to provide assistance with care when needed as well.  According to daughter, anticipated discharge plan is skilled nursing facility for rehabilitation (still in process) per therapy recommendation.   Patient and daughter voiced understanding to call primary care provider's office when she returns back home for a post discharge follow-up appointment within a week or sooner if needs arise.Patient letter (with PCP's contact number) was provided as their reminder.  Patient and daughter expressedunderstanding to requestreferral forTHN care managementservicesfrom primary care provider if deemed necessary and appropriate for further management of her chronic illnesses at home.  [facility] care management information provided for future needs that she may have.  For questions, please contact:  Dannielle Huh, BSN, RN- [facility] Primary Care Navigator  Telephone: [phone] [place]

## 2017-01-18 DIAGNOSIS — N179 Acute kidney failure, unspecified: Secondary | ICD-10-CM | POA: Diagnosis not present

## 2017-01-18 DIAGNOSIS — M25531 Pain in right wrist: Secondary | ICD-10-CM | POA: Diagnosis not present

## 2017-01-18 DIAGNOSIS — E1140 Type 2 diabetes mellitus with diabetic neuropathy, unspecified: Secondary | ICD-10-CM | POA: Diagnosis not present

## 2017-01-18 DIAGNOSIS — N189 Chronic kidney disease, unspecified: Secondary | ICD-10-CM | POA: Diagnosis not present

## 2017-01-18 DIAGNOSIS — I422 Other hypertrophic cardiomyopathy: Secondary | ICD-10-CM | POA: Diagnosis not present

## 2017-01-18 DIAGNOSIS — I255 Ischemic cardiomyopathy: Secondary | ICD-10-CM | POA: Diagnosis not present

## 2017-01-18 DIAGNOSIS — I2510 Atherosclerotic heart disease of native coronary artery without angina pectoris: Secondary | ICD-10-CM | POA: Diagnosis not present

## 2017-01-18 DIAGNOSIS — R262 Difficulty in walking, not elsewhere classified: Secondary | ICD-10-CM | POA: Diagnosis not present

## 2017-01-18 DIAGNOSIS — I5042 Chronic combined systolic (congestive) and diastolic (congestive) heart failure: Secondary | ICD-10-CM | POA: Diagnosis not present

## 2017-01-18 DIAGNOSIS — M6281 Muscle weakness (generalized): Secondary | ICD-10-CM | POA: Diagnosis not present

## 2017-01-18 DIAGNOSIS — R569 Unspecified convulsions: Secondary | ICD-10-CM | POA: Diagnosis not present

## 2017-01-18 DIAGNOSIS — R2681 Unsteadiness on feet: Secondary | ICD-10-CM | POA: Diagnosis not present

## 2017-01-18 DIAGNOSIS — K219 Gastro-esophageal reflux disease without esophagitis: Secondary | ICD-10-CM | POA: Diagnosis not present

## 2017-01-18 DIAGNOSIS — R488 Other symbolic dysfunctions: Secondary | ICD-10-CM | POA: Diagnosis not present

## 2017-01-18 DIAGNOSIS — G40409 Other generalized epilepsy and epileptic syndromes, not intractable, without status epilepticus: Secondary | ICD-10-CM | POA: Diagnosis not present

## 2017-01-18 DIAGNOSIS — I10 Essential (primary) hypertension: Secondary | ICD-10-CM | POA: Diagnosis not present

## 2017-01-18 DIAGNOSIS — R1312 Dysphagia, oropharyngeal phase: Secondary | ICD-10-CM | POA: Diagnosis not present

## 2017-01-18 DIAGNOSIS — R278 Other lack of coordination: Secondary | ICD-10-CM | POA: Diagnosis not present

## 2017-01-18 DIAGNOSIS — I129 Hypertensive chronic kidney disease with stage 1 through stage 4 chronic kidney disease, or unspecified chronic kidney disease: Secondary | ICD-10-CM | POA: Diagnosis not present

## 2017-01-18 DIAGNOSIS — J449 Chronic obstructive pulmonary disease, unspecified: Secondary | ICD-10-CM | POA: Diagnosis not present

## 2017-01-18 LAB — BASIC METABOLIC PANEL
Anion gap: 7 (ref 5–15)
BUN: 15 mg/dL (ref 6–20)
CO2: 23 mmol/L (ref 22–32)
Calcium: 8.4 mg/dL — ABNORMAL LOW (ref 8.9–10.3)
Chloride: 105 mmol/L (ref 101–111)
Creatinine, Ser: 1.25 mg/dL — ABNORMAL HIGH (ref 0.44–1.00)
GFR calc Af Amer: 47 mL/min — ABNORMAL LOW (ref >60)
GFR calc non Af Amer: 40 mL/min — ABNORMAL LOW (ref >60)
Glucose, Bld: 182 mg/dL — ABNORMAL HIGH (ref 65–99)
Potassium: 3.8 mmol/L (ref 3.5–5.1)
Sodium: 135 mmol/L (ref 135–145)

## 2017-01-18 LAB — CBC
HCT: 35.4 % — ABNORMAL LOW (ref 36.0–46.0)
Hemoglobin: 12 g/dL (ref 12.0–15.0)
MCH: 23.6 pg — ABNORMAL LOW (ref 26.0–34.0)
MCHC: 33.9 g/dL (ref 30.0–36.0)
MCV: 69.5 fL — ABNORMAL LOW (ref 78.0–100.0)
Platelets: 211 10*3/uL (ref 150–400)
RBC: 5.09 MIL/uL (ref 3.87–5.11)
RDW: 14.4 % (ref 11.5–15.5)
WBC: 11.3 10*3/uL — ABNORMAL HIGH (ref 4.0–10.5)

## 2017-01-18 LAB — HEMOGLOBIN A1C
Hgb A1c MFr Bld: 7.2 % — ABNORMAL HIGH (ref 4.8–5.6)
Mean Plasma Glucose: 160 mg/dL

## 2017-01-18 LAB — GLUCOSE, CAPILLARY
Glucose-Capillary: 186 mg/dL — ABNORMAL HIGH (ref 65–99)
Glucose-Capillary: 131 mg/dL — ABNORMAL HIGH (ref 65–99)

## 2017-01-18 MED ORDER — PANTOPRAZOLE SODIUM 40 MG PO TBEC: 40.0000 mg | DELAYED_RELEASE_TABLET | Freq: Every day | ORAL | 0 refills | Status: DC

## 2017-01-18 NOTE — Progress Notes (Signed)
Discharge to: [facility] Anticipated discharge date: 01/18/17 Family notified: Yes, at bedside Transportation by: Family car  Report #: [phone], Room 101  CSW signing off.  Laveda Abbe LCSW [phone]

## 2017-01-18 NOTE — Progress Notes (Signed)
Family Medicine Teaching Service Daily Progress Note Intern Pager: [phone]  Patient name: Brittany Roberts Medical record number: [id number] Date of birth: [birth year] Age: 76 y.o. Gender: female  Primary Care Provider: Zenia Resides, MD Consultants: Neurology, PT/OT/SLP Code Status: DNR  Pt Overview and Major Events to Date:  Brittany Roberts is a 76 y.o. Female presenting with seizure like activity and AMS. Patient was admitted to [facility] on 01/13/17.  Assessment and Plan: Brittany Roberts a 76 y.o.femalepresenting with seizure-like activity and AMS. PMH is significant for COPD, schizophrenia, HTN, Type II DM, HLD, GERD, depression, insomnia, hypothyroidism, SLE, chronic pain, hx TIA.   Altered mental status: Improved. Daughter states patient is back at baseline. Possibly due to medication, dementia, worsening schizophrenia, or post-ictal state. Patient had recentmedication change (addition of Seroquel two weeks ago and decrease of dose of Cymbalta), and is also taking multiple other medications that could affect mental status (gabapentin, trazodone, tramadol). Neurology recommendations to continue Keppra and gabapentin and to follow up as outpatient. MRI showing possible seizure foci of encephalomalacia of the right frontal operculum. EEG showing background slowing. UDS was negative. UA was negative. Patient was oriented to person, place and time. Neuro exam was wnl.  - neurology following, appreciate recommendations - vitals per floor - Neuro checks q4 hours - Hold home Seroquel, Cymbalta, trazodone, and tramadol - continue home gabapentin 100 mg bid - appreciate PT/OT recommendations   Seizure-like activity:  Has not recurred. EEG showing background slowing. MRI showing possible seizure foci of ecnephalomalacia of the right frontal operculum. - neurology following, appreciate recommendations - continue keppra - seizure precautions  - Ativan 1mg  prn seizure activity   Acute on Chronic  Kidney disease:  Cr 1.29 on 7/16 on admission > 1.72. Baseline appears to be ~1.5.  - Continue 100 cc/hr NS  Schizophrenia: Started on Seroquel 25mg  qhs on 12/23/16 due to reports of auditory hallucinations. Daughter notes increased paranoia and visual hallucinations since beginning Seroquel. Patient notes increased auditory hallucinations following stroke.  - Discontinue Seroquel  - Consider sitter if patient becomes agitated - will need outpatient follow up to re-evaluate and manage medications   COPD:  Stable. Daughter states that O2 prn at night decreases nighttime agitation.  - budesonide bid  - Continuous pulse ox - O2 PRN at night  CHF:  stable.  - continue lasix 20 mg daily - monitor fluid status  - continue metoprolol XR at 12.5 mg daily  Type II DM with neuropathy: Takes NPH 30U every AM, gabapentin for neuropathy. Last A1C 7.6 on 12/28/16. AM CBG 186. - SSI - continue NPH at 15 units, titrate up as needed for high CBG - gabapentin 100 mg bid - monitor CBG AC/HS  Depression stable, on cymbalta at home, recently added seroquel to regimen - Hold home medications in setting of acute AMS  HTN:  Stable.on amlodipine, lisinopril, and metoprolol at home. BP this am 117/64 -continue metoprolol 12.5 mg   GERD:  stable - continue home protonix   Hypothyroidism: On Synthroid. Last TSH 0.36 on 09/08/16.  - continue home synthroid  - repeat TSH normal  Insomnia:  chronic, on trazodone at home, could be contributing to AMS - Hold home trazodone  Chronic pain:  Secondary to SLE per family report.  - Family is interested in seeing rheumatology as outpatient.  - Hold home tramadol  HLD: Last lipid panel wnl on01/2016.  - continue atorvastatin 20 mg   FEN/GI: Dys 3 diet, protonix PPx: lovenox  Disposition: discharge to SNF  Subjective:  Brittany Tesmer Kingis a 76 y.o.femalepresenting with seizure-like activity and AMS. PMH is significant for COPD,  schizophrenia, HTN, Type II DM, HLD, GERD, depression, insomnia, hypothyroidism, SLE, chronic pain, hx TIA. Patient today states she is feeling better. Patient didn't sleep well last night and O2 prn did not help. States she will need to f/u outpatient for sleep technique suggestions since trazadone was discontinued.   Objective: Temp:  [97.4 F (36.3 C)-98.5 F (36.9 C)] 98 F (36.7 C) (07/17 0859) Pulse Rate:  [75-87] 78 (07/17 0859) Resp:  [17-20] 18 (07/17 0859) BP: (124-144)/(71-80) 132/77 (07/17 0859) SpO2:  [99 %-100 %] 99 % (07/17 0859) Physical Exam: General: awake and alert, sitting up in bed, NAD, alert and oriented x 3 Cardiovascular: RRR, no MRG Respiratory: CTAB, no wheezes, rales, or rhonchi  Abdomen: soft, non tender, non distended, bowel sounds x 4  Extremities: no edema, normal range of motion  Neuro: alert and oriented x 3, CN 2-12 intact, 5+ muscle strength, sensation intact bilaterally   Laboratory:  Recent Labs Lab 01/16/17 0406 01/17/17 0416 01/18/17 0554  WBC 11.7* 12.1* 11.3*  HGB 11.7* 11.6* 12.0  HCT 34.2* 34.1* 35.4*  PLT 206 216 211    Recent Labs Lab 01/13/17 1200  01/16/17 0406 01/17/17 0416 01/18/17 0554  NA 136  < > 138 137 135  K 4.2  < > 3.9 3.9 3.8  CL 104  < > 109 106 105  CO2 20*  < > 20* 24 23  BUN 7  < > 22* 17 15  CREATININE 1.22*  < > 1.72* 1.29* 1.25*  CALCIUM 9.5  < > 8.3* 8.2* 8.4*  PROT 8.6*  --   --   --   --   BILITOT 0.8  --   --   --   --   ALKPHOS 132*  --   --   --   --   ALT 15  --   --   --   --   AST 32  --   --   --   --   GLUCOSE 311*  < > 136* 133* 182*  < > = values in this interval not displayed.  Urinalysis    Component Value Date/Time   COLORURINE YELLOW 01/16/2017 1539   APPEARANCEUR CLEAR 01/16/2017 1539   LABSPEC 1.010 01/16/2017 1539   PHURINE 5.0 01/16/2017 1539   GLUCOSEU NEGATIVE 01/16/2017 1539   HGBUR SMALL (A) 01/16/2017 1539   HGBUR negative 02/26/2009 1314   BILIRUBINUR NEGATIVE  01/16/2017 1539   BILIRUBINUR NEG 04/05/2014 1455   KETONESUR NEGATIVE 01/16/2017 1539   PROTEINUR NEGATIVE 01/16/2017 1539   UROBILINOGEN 0.2 08/01/2014 2305   NITRITE NEGATIVE 01/16/2017 1539   LEUKOCYTESUR NEGATIVE 01/16/2017 1539    Drugs of Abuse     Component Value Date/Time   LABOPIA NONE DETECTED 01/16/2017 1540   COCAINSCRNUR NONE DETECTED 01/16/2017 1540   LABBENZ NONE DETECTED 01/16/2017 1540   AMPHETMU NONE DETECTED 01/16/2017 1540   THCU NONE DETECTED 01/16/2017 1540   LABBARB NONE DETECTED 01/16/2017 1540      Imaging/Diagnostic Tests: Dg Chest 2 View  Result Date: 01/13/2017 CLINICAL DATA:  Altered mental status EXAM: CHEST  2 VIEW COMPARISON:  08/01/2010 FINDINGS: There is no focal consolidation. There are low lung volumes. There is bilateral interstitial prominence and prominence of the central pulmonary vasculature likely accentuated by low lung volumes. There is no pleural effusion or pneumothorax. There  is stable cardiomegaly. The osseous structures are unremarkable. IMPRESSION: Cardiomegaly.  No evidence of CHF. Electronically Signed   By: Kathreen Devoid   On: 01/13/2017 11:55   Ct Head Wo Contrast  Result Date: 01/13/2017 CLINICAL DATA:  Seizure activity. EXAM: CT HEAD WITHOUT CONTRAST TECHNIQUE: Contiguous axial images were obtained from the base of the skull through the vertex without intravenous contrast. COMPARISON:  MRI head 02/21/2015.  Head CT 08/01/2014. FINDINGS: Brain: Hypo attenuation in the posterior right frontal lobe is probably encephalomalacia related to prior infarct although there is some preservation of the overlying cortex. No evidence for acute hemorrhage, hydrocephalus, or abnormal extra-axial fluid collection. Diffuse loss of parenchymal volume is consistent with atrophy. Patchy low attenuation in the deep hemispheric and periventricular white matter is nonspecific, but likely reflects chronic microvascular ischemic demyelination. Vascular: No  hyperdense vessel or unexpected calcification. Skull: No evidence for fracture. No worrisome lytic or sclerotic lesion. Sinuses/Orbits: The visualized paranasal sinuses and mastoid air cells are clear. Visualized portions of the globes and intraorbital fat are unremarkable. Other: None. IMPRESSION: 1. Focal hypo attenuation posterior right frontal lobe is new since the 2016 exams and probably related to interval nonacute infarct given subtle ex vacuo dilatation right frontal horn. There is some preservation of the overlying cortex and although vasogenic edema is considered less likely, MRI of the brain is suggested for more definitive characterization. 2. Diffuse atrophy with chronic small vessel white matter ischemic disease. 3. Electronically Signed   By: Misty Stanley M.D.   On: 01/13/2017 12:42   Ct Angio Chest Pe W And/or Wo Contrast  Result Date: 01/13/2017 CLINICAL DATA:  Seizures, shortness of breath, lethargy and confusion. History of sickle cell disease. EXAM: CT ANGIOGRAPHY CHEST WITH CONTRAST TECHNIQUE: Multidetector CT imaging of the chest was performed using the standard protocol during bolus administration of intravenous contrast. Multiplanar CT image reconstructions and MIPs were obtained to evaluate the vascular anatomy. CONTRAST:  100 cc Omnipaque 370 COMPARISON:  None. FINDINGS: Cardiovascular: The heart is mildly enlarged. No pericardial effusion. The aorta is normal in caliber. Mild tortuosity and moderate atherosclerotic calcifications. Coronary artery calcifications are noted. The pulmonary arterial tree is fairly well opacified. There is moderate breathing motion artifact but no definite large central pulmonary emboli. Mediastinum/Nodes: Small scattered mediastinal and hilar lymph nodes but no mass or overt adenopathy. The esophagus is grossly normal. Lungs/Pleura: Moderate breathing motion artifact. No definite acute pulmonary findings such as infiltrate, edema or effusion. No worrisome  pulmonary lesions. Upper Abdomen: Small hiatal hernia noted. Partially calcified spleen. Status post cholecystectomy. Large right renal cyst. Moderate aortic calcifications. Chest wall/ Musculoskeletal: No breast masses, supraclavicular or axillary lymphadenopathy. There is massive left-sided thyromegaly also demonstrated on prior thyroid ultrasound examinations. Moderate deviation of the trachea rightward. No significant bony findings. Review of the MIP images confirms the above findings. IMPRESSION: 1. Examination is somewhat limited by breathing motion artifact and body habitus. 2. No definite CT findings for pulmonary embolism. 3. Normal caliber thoracic aorta. Moderate atherosclerotic calcifications. Three-vessel coronary artery calcifications. 4. No significant pulmonary findings. 5. No significant upper abdominal findings. Aortic Atherosclerosis (ICD10-I70.0). Electronically Signed   By: Marijo Sanes M.D.   On: 01/13/2017 15:24   Mr Jeri Cos XL Contrast  Result Date: 01/16/2017 CLINICAL DATA:  Altered mental status.  Seizures. EXAM: MRI HEAD WITHOUT AND WITH CONTRAST TECHNIQUE: Multiplanar, multiecho pulse sequences of the brain and surrounding structures were obtained without and with intravenous contrast. CONTRAST:  MULTIHANCE GADOBENATE DIMEGLUMINE 529  MG/ML IV SOLN COMPARISON:  CT head without contrast 01/13/2017. MRI brain 02/21/2015. FINDINGS: Brain: Focal encephalomalacia is present in the right frontal operculum. There is some involvement of the superior insular cortex. There is no associated hemorrhage. Volume loss is present with ex vacuo dilation of the right lateral ventricle. Surrounding white matter gliosis is noted. No acute infarct is present. Moderate left periventricular T2 changes are evident as well. The brainstem and cerebellum are normal. The internal auditory canals are within normal limits. Vascular: Flow is present in the major intracranial arteries. Skull and upper cervical  spine: The skullbase is within normal limits. The craniocervical junction is normal. A relatively empty sella is again seen. The craniocervical junction is within normal limits. The upper cervical spine is within normal limits. Midline sagittal structures are otherwise unremarkable. Sinuses/Orbits: There is chronic opacification of the right maxillary sinus with a shrunken sinus. Marked wall thickening is noted. No active disease is present. The paranasal sinuses and mastoid air cells are otherwise clear. IMPRESSION: 1. Encephalomalacia involving the right frontal operculum is remote. This could serve as a seizure focus. 2. No acute intracranial abnormality. 3. Other diffuse periventricular white matter changes bilaterally are mildly advanced for age. This likely reflects the sequela of chronic microvascular ischemia. 4. Chronic right maxillary sinus disease without active disease. Electronically Signed   By: San Morelle M.D.   On: 01/16/2017 13:46     Caroline More, DO 01/18/2017, 9:05 AM PGY-1, [place] Intern pager: [phone], text pages welcome

## 2017-01-18 NOTE — Progress Notes (Signed)
Pt discharging at this time with her daughter taking all personal belongings. IV discontinued, dry dressing applied. Discharge instructions provided with verbal understanding. No noted distress.

## 2017-01-18 NOTE — Progress Notes (Signed)
Social Note from PCP.  Appreciate great care of team. Visited and answered questions for patient and daughter.  Because they questioned, I will elaborate on the diagnosis of schizophrenia.  To the best of my recollection, this diagnosis was already on the chart when I became the PCP for Ms Riga 10-15 years ago.  She has not had hallucinations or been on antipsychotic meds until very recently.  She has always been a touch odd and a touch suspicious.  Because of those traits, I did not remove the diagnosis.  The actual diagnosis of either mental illness or early cognitive decline/dementia is further complicated by her limited education.  Bottom line:  I am uncertain if the patient truly warrants the diagnosis of schizophrenia.  If she does, it is quite mild and manageable.  She has not been a behavioral problem.  I doubt that she needs long term antipsychotic medications.  She seems to be doing quite well right now with Keppra for seizures and prn lorazepam.  I am delighted that the team has been able to simplify her medication regimen.

## 2017-01-18 NOTE — Progress Notes (Signed)
Pt will be discharged to [facility]. Daughter will be transporting her to facility but she stated that she had to go home first to get pt walker. Report called in to nurse, [place].

## 2017-01-18 NOTE — Progress Notes (Signed)
Physical Therapy Treatment Patient Details Name: Brittany Roberts MRN: [id number] DOB: 11-Jan-1941 Today's Date: 01/18/2017    History of Present Illness Pt is a 76 y/o female admitted secondary to likely tonic-clonic seizure activity. PMH including but not limited to COPD, CHF, CAD, CKD, DM, HLD, HTN, SLE and schizophrenia.    PT Comments    Pt tolerated Tx well, still requires assistance and repeated VC's for transfer technique/ hand placement and gait. Pt due to D/C to SNF this PM.     Follow Up Recommendations  SNF     Equipment Recommendations  None recommended by PT    Recommendations for Other Services       Precautions / Restrictions Precautions Precautions: Fall;Other (comment) (seizure precautions) Restrictions Weight Bearing Restrictions: No    Mobility  Bed Mobility Overal bed mobility: Needs Assistance Bed Mobility: Supine to Sit     Supine to sit: HOB elevated;Min assist     General bed mobility comments: min assist for trunk elevation, HOB elevated   Transfers Overall transfer level: Needs assistance Equipment used: Rolling walker (2 wheeled) Transfers: Sit to/from Stand Sit to Stand: Min assist         General transfer comment: Pt required boost into standing, VC's for hand placement/ technique (Multiple times) with transfer OOB to walker  Ambulation/Gait Ambulation/Gait assistance: Min guard Ambulation Distance (Feet): 100 Feet Assistive device: Rolling walker (2 wheeled) Gait Pattern/deviations: Trunk flexed;Drifts right/left;Step-to pattern Gait velocity: decreased   General Gait Details: Pt drifting to right during ambulation, VC's for posture, step through pattern and looking forward to prevent drifting   Stairs            Wheelchair Mobility    Modified Rankin (Stroke Patients Only)       Balance Overall balance assessment: Needs assistance Sitting-balance support: Feet supported;No upper extremity supported Sitting  balance-Leahy Scale: Fair Sitting balance - Comments: supervision to sit EOB   Standing balance support: Bilateral upper extremity supported;During functional activity Standing balance-Leahy Scale: Poor Standing balance comment: relaint on RW during ambulation, leans against sink for balance during ADL                            Cognition Arousal/Alertness: Awake/alert Behavior During Therapy: WFL for tasks assessed/performed Overall Cognitive Status: Impaired/Different from baseline Area of Impairment: Following commands                       Following Commands: Follows multi-step commands inconsistently;Follows multi-step commands with increased time              Exercises      General Comments        Pertinent Vitals/Pain Pain Assessment: No/denies pain    Home Living                      Prior Function            PT Goals (current goals can now be found in the care plan section) Progress towards PT goals: Progressing toward goals    Frequency    Min 3X/week      PT Plan Current plan remains appropriate    Co-evaluation              AM-PAC PT "6 Clicks" Daily Activity  Outcome Measure  Difficulty turning over in bed (including adjusting bedclothes, sheets and blankets)?: A Lot Difficulty moving from lying on back  to sitting on the side of the bed? : A Lot Difficulty sitting down on and standing up from a chair with arms (e.g., wheelchair, bedside commode, etc,.)?: A Lot Help needed moving to and from a bed to chair (including a wheelchair)?: A Little Help needed walking in hospital room?: A Little Help needed climbing 3-5 steps with a railing? : A Lot 6 Click Score: 14    End of Session Equipment Utilized During Treatment: Gait belt Activity Tolerance: Patient tolerated treatment well Patient left: in bed;with call bell/phone within reach;with family/visitor present Nurse Communication: Mobility status PT Visit  Diagnosis: Unsteadiness on feet (R26.81);Other abnormalities of gait and mobility (R26.89)     Time: 1437-1500 PT Time Calculation (min) (ACUTE ONLY): 23 min  Charges:  $Gait Training: 8-22 mins $Therapeutic Activity: 8-22 mins                    G Codes:       Brittany Roberts, SPTA [id number]   Brittany Roberts 01/18/2017, 4:29 PM

## 2017-01-19 ENCOUNTER — Ambulatory Visit: Payer: Medicare Other | Admitting: Family Medicine

## 2017-01-21 ENCOUNTER — Encounter: Payer: Self-pay | Admitting: Internal Medicine

## 2017-01-21 ENCOUNTER — Non-Acute Institutional Stay (SKILLED_NURSING_FACILITY): Payer: Medicare Other | Admitting: Internal Medicine

## 2017-01-21 DIAGNOSIS — E1169 Type 2 diabetes mellitus with other specified complication: Secondary | ICD-10-CM | POA: Diagnosis not present

## 2017-01-21 DIAGNOSIS — I5042 Chronic combined systolic (congestive) and diastolic (congestive) heart failure: Secondary | ICD-10-CM

## 2017-01-21 DIAGNOSIS — F329 Major depressive disorder, single episode, unspecified: Secondary | ICD-10-CM

## 2017-01-21 DIAGNOSIS — F315 Bipolar disorder, current episode depressed, severe, with psychotic features: Secondary | ICD-10-CM

## 2017-01-21 DIAGNOSIS — E034 Atrophy of thyroid (acquired): Secondary | ICD-10-CM | POA: Diagnosis not present

## 2017-01-21 DIAGNOSIS — Z8739 Personal history of other diseases of the musculoskeletal system and connective tissue: Secondary | ICD-10-CM | POA: Diagnosis not present

## 2017-01-21 DIAGNOSIS — I10 Essential (primary) hypertension: Secondary | ICD-10-CM | POA: Diagnosis not present

## 2017-01-21 DIAGNOSIS — M329 Systemic lupus erythematosus, unspecified: Secondary | ICD-10-CM

## 2017-01-21 DIAGNOSIS — I422 Other hypertrophic cardiomyopathy: Secondary | ICD-10-CM | POA: Diagnosis not present

## 2017-01-21 DIAGNOSIS — N183 Chronic kidney disease, stage 3 (moderate): Secondary | ICD-10-CM

## 2017-01-21 DIAGNOSIS — R569 Unspecified convulsions: Secondary | ICD-10-CM

## 2017-01-21 DIAGNOSIS — E118 Type 2 diabetes mellitus with unspecified complications: Secondary | ICD-10-CM

## 2017-01-21 DIAGNOSIS — E785 Hyperlipidemia, unspecified: Secondary | ICD-10-CM

## 2017-01-21 DIAGNOSIS — N179 Acute kidney failure, unspecified: Secondary | ICD-10-CM | POA: Diagnosis not present

## 2017-01-21 DIAGNOSIS — J449 Chronic obstructive pulmonary disease, unspecified: Secondary | ICD-10-CM | POA: Diagnosis not present

## 2017-01-21 DIAGNOSIS — Z794 Long term (current) use of insulin: Secondary | ICD-10-CM

## 2017-01-21 DIAGNOSIS — F32A Depression, unspecified: Secondary | ICD-10-CM

## 2017-01-21 NOTE — Progress Notes (Signed)
: Provider:  Noah Delaine. Sheppard Coil, MD Location:  [place] Room Number: [place] of Service:  SNF ([phone])  PCP: Zenia Resides, MD Patient Care Team: Zenia Resides, MD as PCP - General (Family Medicine) Inocencio Homes, DPM as Consulting Physician (Podiatry) Adrian Prows, MD as Consulting Physician (Cardiology) Inda Castle, MD as Consulting Physician (Gastroenterology)  Extended Emergency Contact Information Primary Emergency Contact: [name] Address: [address], [place] [address] [place] of [place] Phone: [phone] Relation: Daughter Secondary Emergency Contact: Kerby Moors, [place] [address] [place] of [place] Phone: [phone] Relation: Daughter     Allergies: Ace inhibitors; Bidil [isosorb dinitrate-hydralazine]; Buspar [buspirone]; Pregabalin; Ropinirole hydrochloride; Amantadines; and Penicillins  Chief Complaint  Patient presents with  . New Admit To SNF    following hospitalization 01/13/17 to 01/18/17 seizure    HPI: Patient is 76 y.o. female with COPD, schizophrenia, hypertension, type 2 diabetes, hyperlipidemia, GERD, depression, insomnia, hypothyroidism, SLE, chronic pain, history of TIA, coronary artery disease who presented with seizure-like activity and altered mental status to [facility] emergency department. Patient's daughter described a convulsive event in which patient had rhythmic movements of all 4 limbs with tonic head deviation to the left. Event occurred for approximately 2-5 minutes followed by somnolence, confusion, and incontinence. Since then patient was noted to have difficulty speaking. Family has actually noticed speech difficulties for the past 1-2 weeks with patients and this is not making sense. Family history of seizures in the family thinks her symptoms are due to Seroquel which was started 2 weeks ago due to possible auditory hallucinations and  schizophrenia patient also fell 4 days ago but family isn't sure if she hit her head or not. Prior to the past 2 weeks patient had been able to perform all ADLs and was driving herself patient does have elevated comes to her house for 2 hours in the morning but otherwise she has been living alone and managing her own medications. Patient was admitted to [facility] from 7/12-17 for workup of possible new tonic clonic seizures. CT head was remarkable for a right frontal lesion with X vacuo changes from 2016 consistent with old CVA there is a question of surrounding vasogenic edema which will knee which will require an MRI. MRI showed possible seizure foci of encephalomalacia of the right frontal lobe. EEG showed background slowing. UDS and UA were negative chest x-ray was negative. Per neurology patient was taken off of home trazodone tramadol Cymbalta and Seroquel and begun on Keppra 500 mg by mouth twice a day. Her sensorium cleared back to baseline and patient was once again alert and oriented 3. Hospital course was complicated by acute kidney injury with elevated creatinine that responded to IV fluids. Patient will be admitted to skilled nursing facility with generalized weakness for OT/PT. While at skilled nursing facility patient will be followed for hypertension treated with Norvasc Lasix lisinopril and metoprolol diabetes treated with metformin and NPH insulin, and cardiomyopathy treated with metoprolol XL , Lasix and lisinopril.  Past Medical History:  Diagnosis Date  . Acute delirium 01/14/2017  . Anxiety   . Arthritis    "all over"  . Asthma   . Bipolar disorder ([place])   . Complication of anesthesia    "w/right foot OR they gave me too much and they couldn't get me woke"  . Congestive heart  failure, unspecified   . Coronary artery calcification seen on CAT scan 01/14/2017  . Coronary artery disease    "I've got 1 stent" (06/30/2015)  . Depression   . Discoid lupus   . Fibromyalgia      "I've been told I have this; can't take Lyrica cause I'm allergic to it" (06/30/2015)  . GERD (gastroesophageal reflux disease)   . History of gout   . History of hiatal hernia    "real bad" (06/30/2015)  . Hypertension   . Hypothyroidism   . On home oxygen therapy    "2L q hs" (06/30/2015)  . OSA (obstructive sleep apnea)    "just use my oxygen; no mask" (06/30/2015)  . Other and unspecified hyperlipidemia   . Penetrating foot wound    left nonhealing foot wound on the dorsal surface  . Peripheral neuropathy   . Pneumonia "many of times"  . Refusal of blood transfusions as patient is Jehovah's Witness   . Renal failure, unspecified    "kidneys not working 100%" (06/30/2015)  . Sickle cell trait ([place])   . Systemic lupus ([place])   . Thoracic aortic atherosclerosis ([place]) 01/14/2017  . Type II diabetes mellitus ([place])   . Unspecified vitamin D deficiency     Past Surgical History:  Procedure Laterality Date  . ANKLE ARTHROSCOPY WITH OPEN REDUCTION INTERNAL FIXATION (ORIF) Right 03/2011  . CARDIAC CATHETERIZATION N/A 07/01/2015   Procedure: Left Heart Cath and Coronary Angiography;  Surgeon: Adrian Prows, MD;  Location: [place] [facility];  Service: Cardiovascular;  Laterality: N/A;  . CARDIAC CATHETERIZATION N/A 07/01/2015   Procedure: Intravascular Pressure Wire/FFR Study;  Surgeon: Adrian Prows, MD;  Location: [place] [facility];  Service: Cardiovascular;  Laterality: N/A;  . CARPAL TUNNEL RELEASE Bilateral   . CATARACT EXTRACTION W/ INTRAOCULAR LENS  IMPLANT, BILATERAL Bilateral   . CHOLECYSTECTOMY OPEN    . COLONOSCOPY N/A 12/10/2013   Procedure: COLONOSCOPY;  Surgeon: Inda Castle, MD;  Location: [facility] ENDOSCOPY;  Service: Endoscopy;  Laterality: N/A;  . FRACTURE SURGERY    . INCISION AND DRAINAGE ABSCESS Left    foot "under my little toe"  . KNEE LIGAMENT RECONSTRUCTION Right   . LEFT HEART CATHETERIZATION WITH CORONARY ANGIOGRAM N/A 08/08/2012   Procedure: LEFT HEART  CATHETERIZATION WITH CORONARY ANGIOGRAM;  Surgeon: [name], MD;  Location: [facility] CATH LAB;  Service: Cardiovascular;  Laterality: N/A;  . NASAL SINUS SURGERY    . PERCUTANEOUS CORONARY STENT INTERVENTION (PCI-S) N/A 08/21/2012   Procedure: PERCUTANEOUS CORONARY STENT INTERVENTION (PCI-S);  Surgeon: [name], MD;  Location: [facility] CATH LAB;  Service: Cardiovascular;  Laterality: N/A;  . TUBAL LIGATION      Allergies as of 01/21/2017      Reactions   Ace Inhibitors Other (See Comments)   Coincided with sig bump in creat. Retried and creat bumped again.   Bidil [isosorb Dinitrate-hydralazine] Other (See Comments)   Sleep all the time.   Buspar [buspirone] Other (See Comments)   pain   Pregabalin Swelling   Ropinirole Hydrochloride Swelling   Amantadines Rash   Penicillins Swelling, Rash   Has patient had a PCN reaction causing immediate rash, facial/tongue/throat swelling, SOB or lightheadedness with hypotension: Yes Has patient had a PCN reaction causing severe rash involving mucus membranes or skin necrosis: Yes Has patient had a PCN reaction that required hospitalization: Yes Has patient had a PCN reaction occurring within the last 10 years: No If all of the above answers are "  NO", then may proceed with Cephalosporin use.      Medication List       Accurate as of 01/21/17  8:46 AM. Always use your most recent med list.          albuterol 108 (90 Base) MCG/ACT inhaler Commonly known as:  PROAIR HFA inhale 2 puffs by mouth every 4 hours if needed USE ONLY IF YOU ARE WHEEZING   amLODipine 10 MG tablet Commonly known as:  NORVASC Take 1 tablet (10 mg total) by mouth daily.   aspirin EC 81 MG tablet Take 162 mg by mouth daily.   atorvastatin 20 MG tablet Commonly known as:  LIPITOR Take 1 tablet (20 mg total) by mouth daily.   FLAXSEED OIL PO Take 1 capsule by mouth daily.   fluticasone 220 MCG/ACT inhaler Commonly known as:  FLOVENT HFA inhale 1 puff by  mouth twice a day   furosemide 20 MG tablet Commonly known as:  LASIX Take 1 tablet (20 mg total) by mouth daily.   gabapentin 300 MG capsule Commonly known as:  NEURONTIN Take 1 capsule (300 mg total) by mouth 2 (two) times daily.   guaiFENesin 600 MG 12 hr tablet Commonly known as:  MUCINEX Take 1 tablet (600 mg total) by mouth 2 (two) times daily.   Insulin NPH (Human) (Isophane) 100 UNIT/ML Kiwkpen Commonly known as:  HUMULIN N KWIKPEN Inject 30 Units into the skin every morning.   levETIRAcetam 500 MG tablet Commonly known as:  KEPPRA Take 1 tablet (500 mg total) by mouth 2 (two) times daily.   levothyroxine 25 MCG tablet Commonly known as:  SYNTHROID, LEVOTHROID Take 25 mcg by mouth daily.   lisinopril 10 MG tablet Commonly known as:  PRINIVIL,ZESTRIL Take 10 mg by mouth daily.   metFORMIN 500 MG tablet Commonly known as:  GLUCOPHAGE Take 1 tablet (500 mg total) by mouth 2 (two) times daily with a meal.   metoprolol succinate 50 MG 24 hr tablet Commonly known as:  TOPROL-XL TAKE ONE TABLET BY MOUTH ONCE DAILY WITH OR IMMEDIATELY FOLLOWING A MEAL   mometasone 0.1 % ointment Commonly known as:  ELOCON Apply topically daily.   multivitamin with minerals Tabs tablet Take 1 tablet by mouth daily.   nitroGLYCERIN 0.4 MG SL tablet Commonly known as:  NITROSTAT Place 1 tablet (0.4 mg total) under the tongue every 5 (five) minutes as needed for chest pain.   pantoprazole 40 MG tablet Commonly known as:  PROTONIX Take 1 tablet (40 mg total) by mouth daily.   PEN NEEDLES 31GX5/16" 31G X 8 MM Misc Check Blood sugar three times per day. Dx insulin using E11.8   psyllium 28 % packet Commonly known as:  METAMUCIL SMOOTH TEXTURE Take 1 packet by mouth 2 (two) times daily.   VITAMIN C PO Take 1 tablet by mouth daily.   Vitamin D3 5000 units Caps Take 5,000 Units by mouth daily.       No orders of the defined types were placed in this  encounter.   Immunization History  Administered Date(s) Administered  . Influenza Split 04/03/2012, 02/24/2016  . Influenza Whole 05/07/2009, 05/08/2010, 03/15/2011  . Influenza,inj,Quad PF,36+ Mos 04/25/2013, 04/05/2014, 06/05/2015  . Pneumococcal Conjugate-13 08/10/2013  . Pneumococcal Polysaccharide-23 08/05/2000, 02/26/2009, 02/24/2016  . Td 08/05/2005    Social History  Substance Use Topics  . Smoking status: Former Smoker    Packs/day: 3.00    Years: 40.00    Types: Cigarettes    Start  date: 07/05/1960    Quit date: 07/05/1998  . Smokeless tobacco: Never Used  . Alcohol use No     Comment: 06/30/2015 "used to drink a little alcohol on the weekends; not q weekend; nothing in years"    Family history is   Family History  Problem Relation Age of Onset  . Heart disease Mother   . Diabetes Mother   . Clotting disorder Mother   . Pneumonia Father   . Rheum arthritis Father   . Diabetes Sister   . Diabetes Sister   . Asthma Brother   . Cancer Brother   . Kidney disease Brother   . Lupus Son   . Heart disease Son   . Heart disease Daughter   . Colon cancer Neg Hx       Review of Systems  DATA OBTAINED: from patient, nurse GENERAL:  no fevers, fatigue, appetite changes SKIN: No itching, or rash EYES: No eye pain, redness, discharge EARS: No earache, tinnitus, change in hearing NOSE: No congestion, drainage or bleeding  MOUTH/THROAT: No mouth or tooth pain, No sore throat RESPIRATORY: No cough, wheezing, SOB CARDIAC: No chest pain, palpitations, lower extremity edema  GI: No abdominal pain, No N/V/D or constipation, No heartburn or reflux  GU: No dysuria, frequency or urgency, or incontinence  MUSCULOSKELETAL: No unrelieved bone/joint pain NEUROLOGIC: No headache, dizziness or focal weakness PSYCHIATRIC: No c/o anxiety or sadness   Vitals:   01/21/17 0834  BP: 124/63  Pulse: 83  Resp: 16  Temp: 98.5 F (36.9 C)    SpO2 Readings from Last 1 Encounters:   01/21/17 99%   Body mass index is 46.41 kg/m.     Physical Exam  GENERAL APPEARANCE: Alert, conversant,  No acute distress.  SKIN: No diaphoresis rash HEAD: Normocephalic, atraumatic  EYES: Conjunctiva/lids clear. Pupils round, reactive. EOMs intact.  EARS: External exam WNL, canals clear. Hearing grossly normal.  NOSE: No deformity or discharge.  MOUTH/THROAT: Lips w/o lesions  RESPIRATORY: Breathing is even, unlabored. Lung sounds are clear   CARDIOVASCULAR: Heart RRR no murmurs, rubs or gallops. No peripheral edema.   GASTROINTESTINAL: Abdomen is soft, non-tender, not distended w/ normal bowel sounds. GENITOURINARY: Bladder non tender, not distended  MUSCULOSKELETAL: No abnormal joints or musculature NEUROLOGIC:  Cranial nerves 2-12 grossly intact. Moves all extremities  PSYCHIATRIC: Mood and affect appropriate to situation, no behavioral issues  Patient Active Problem List   Diagnosis Date Noted  . Seizure ([place])   . Thoracic aortic atherosclerosis ([place]) 01/14/2017  . Coronary artery calcification seen on CAT scan 01/14/2017  . Acute delirium 01/14/2017  . Possible Tonic-clonic seizure disorder ([place]) 01/14/2017  . CVA (cerebral vascular accident) ([place]) 01/14/2017  . Altered mental status 01/13/2017  . Mastalgia 04/20/2016  . Bilateral leg edema 02/10/2016  . Hearing difficulty 10/30/2015  . Midline low back pain with right-sided sciatica 09/25/2015  . Angina pectoris ([place]) 06/30/2015  . Dysarthria 02/14/2015  . Sinusitis, chronic 10/25/2014  . Schizophrenia, unspecified type ([place]) 10/25/2014  . Type 2 diabetes mellitus with complication ([place])   . Cephalalgia   . Hx of systemic lupus erythematosus (SLE)   . Thyroid mass 04/26/2014  . Unspecified constipation 12/10/2013  . Benign neoplasm of colon 12/10/2013  . Controlled type 2 diabetes with neuropathy ([place]) 11/27/2013  . Cystic kidney disease 11/09/2013  . Insulin dependent type 2 diabetes mellitus ([place])  10/29/2013  . Hoarseness or changing voice 09/27/2013  . Dyskinesia, tardive 07/11/2013  . TMJ syndrome 02/16/2013  .  Angina pectoris associated with type 2 diabetes mellitus ([place]) 08/08/2012  . CAD in native artery 08/08/2012  . Asthma, chronic, PRESUMED wiht MULTIFACTORIAL DYSPNEA 05/30/2012  . Insomnia 09/22/2011  . Mobility poor 02/02/2011  . Chronic kidney disease, stage III (moderate) 05/13/2010  . Chronic combined systolic and diastolic CHF, NYHA class 2 ([place]) 03/31/2010  . COPD (chronic obstructive pulmonary disease) ([place]) 03/26/2010  . Morbid obesity ([place]) 02/27/2010  . DM (diabetes mellitus), type 2 with neurological complications ([place]) 93/79/0240  . Osteoarthritis 04/25/2009  . Anemia 03/11/2007  . COGNITIVE IMPAIRMENT, MILD, SO STATED 03/11/2007  . RESTLESS LEG SYNDROME 03/11/2007  . LUPUS ERYTHEMATOSUS, DISCOID 03/11/2007  . Hypothyroidism 03/07/2007  . DYSLIPIDEMIA 03/07/2007  . DEPRESSION 03/07/2007  . Essential hypertension 03/07/2007  . Coronary atherosclerosis 03/07/2007  . GERD 03/07/2007  . SLEEP APNEA 03/07/2007      Labs reviewed: Basic Metabolic Panel:    Component Value Date/Time   NA 135 01/18/2017 0554   NA 140 10/29/2014 1100   K 3.8 01/18/2017 0554   CL 105 01/18/2017 0554   CO2 23 01/18/2017 0554   GLUCOSE 182 (H) 01/18/2017 0554   BUN 15 01/18/2017 0554   BUN 34 (H) 10/29/2014 1100   CREATININE 1.25 (H) 01/18/2017 0554   CREATININE 1.43 (H) 09/08/2016 1121   CALCIUM 8.4 (L) 01/18/2017 0554   PROT 8.6 (H) 01/13/2017 1200   PROT 7.4 10/29/2014 1100   ALBUMIN 3.6 01/13/2017 1200   ALBUMIN 3.7 10/29/2014 1100   AST 32 01/13/2017 1200   ALT 15 01/13/2017 1200   ALKPHOS 132 (H) 01/13/2017 1200   BILITOT 0.8 01/13/2017 1200   BILITOT 0.3 10/29/2014 1100   GFRNONAA 40 (L) 01/18/2017 0554   GFRNONAA 36 (L) 09/08/2016 1121   GFRAA 47 (L) 01/18/2017 0554   GFRAA 41 (L) 09/08/2016 1121     Recent Labs  01/16/17 0406 01/17/17 0416  01/18/17 0554  NA 138 137 135  K 3.9 3.9 3.8  CL 109 106 105  CO2 20* 24 23  GLUCOSE 136* 133* 182*  BUN 22* 17 15  CREATININE 1.72* 1.29* 1.25*  CALCIUM 8.3* 8.2* 8.4*   Liver Function Tests:  Recent Labs  09/08/16 1121 01/13/17 1200  AST 18 32  ALT 12 15  ALKPHOS 146* 132*  BILITOT 0.3 0.8  PROT 7.9 8.6*  ALBUMIN 3.6 3.6   No results for input(s): LIPASE, AMYLASE in the last 8760 hours. No results for input(s): AMMONIA in the last 8760 hours. CBC:  Recent Labs  01/13/17 1200  01/16/17 0406 01/17/17 0416 01/18/17 0554  WBC 13.5*  < > 11.7* 12.1* 11.3*  NEUTROABS 12.0*  --   --   --   --   HGB 12.7  < > 11.7* 11.6* 12.0  HCT 37.5  < > 34.2* 34.1* 35.4*  MCV 71.0*  < > 70.4* 70.9* 69.5*  PLT 244  < > 206 216 211  < > = values in this interval not displayed. Lipid No results for input(s): CHOL, HDL, LDLCALC, TRIG in the last 8760 hours.  Cardiac Enzymes: No results for input(s): CKTOTAL, CKMB, CKMBINDEX, TROPONINI in the last 8760 hours. BNP: No results for input(s): BNP in the last 8760 hours. Lab Results  Component Value Date   MICROALBUR 1.82 11/23/2013   Lab Results  Component Value Date   HGBA1C 7.2 (H) 01/15/2017   Lab Results  Component Value Date   TSH 0.404 01/15/2017   Lab Results  Component Value Date  MOQHUTML46 621 01/13/2017   Lab Results  Component Value Date   FOLATE 10.0 10/29/2014   Lab Results  Component Value Date   FERRITIN 104 09/08/2016    Imaging and Procedures obtained prior to SNF admission: Dg Chest 2 View  Result Date: 01/13/2017 CLINICAL DATA:  Altered mental status EXAM: CHEST  2 VIEW COMPARISON:  08/01/2010 FINDINGS: There is no focal consolidation. There are low lung volumes. There is bilateral interstitial prominence and prominence of the central pulmonary vasculature likely accentuated by low lung volumes. There is no pleural effusion or pneumothorax. There is stable cardiomegaly. The osseous structures are  unremarkable. IMPRESSION: Cardiomegaly.  No evidence of CHF. Electronically Signed   By: Kathreen Devoid   On: 01/13/2017 11:55   Ct Head Wo Contrast  Result Date: 01/13/2017 CLINICAL DATA:  Seizure activity. EXAM: CT HEAD WITHOUT CONTRAST TECHNIQUE: Contiguous axial images were obtained from the base of the skull through the vertex without intravenous contrast. COMPARISON:  MRI head 02/21/2015.  Head CT 08/01/2014. FINDINGS: Brain: Hypo attenuation in the posterior right frontal lobe is probably encephalomalacia related to prior infarct although there is some preservation of the overlying cortex. No evidence for acute hemorrhage, hydrocephalus, or abnormal extra-axial fluid collection. Diffuse loss of parenchymal volume is consistent with atrophy. Patchy low attenuation in the deep hemispheric and periventricular white matter is nonspecific, but likely reflects chronic microvascular ischemic demyelination. Vascular: No hyperdense vessel or unexpected calcification. Skull: No evidence for fracture. No worrisome lytic or sclerotic lesion. Sinuses/Orbits: The visualized paranasal sinuses and mastoid air cells are clear. Visualized portions of the globes and intraorbital fat are unremarkable. Other: None. IMPRESSION: 1. Focal hypo attenuation posterior right frontal lobe is new since the 2016 exams and probably related to interval nonacute infarct given subtle ex vacuo dilatation right frontal horn. There is some preservation of the overlying cortex and although vasogenic edema is considered less likely, MRI of the brain is suggested for more definitive characterization. 2. Diffuse atrophy with chronic small vessel white matter ischemic disease. 3. Electronically Signed   By: Misty Stanley M.D.   On: 01/13/2017 12:42   Ct Angio Chest Pe W And/or Wo Contrast  Result Date: 01/13/2017 CLINICAL DATA:  Seizures, shortness of breath, lethargy and confusion. History of sickle cell disease. EXAM: CT ANGIOGRAPHY CHEST WITH  CONTRAST TECHNIQUE: Multidetector CT imaging of the chest was performed using the standard protocol during bolus administration of intravenous contrast. Multiplanar CT image reconstructions and MIPs were obtained to evaluate the vascular anatomy. CONTRAST:  100 cc Omnipaque 370 COMPARISON:  None. FINDINGS: Cardiovascular: The heart is mildly enlarged. No pericardial effusion. The aorta is normal in caliber. Mild tortuosity and moderate atherosclerotic calcifications. Coronary artery calcifications are noted. The pulmonary arterial tree is fairly well opacified. There is moderate breathing motion artifact but no definite large central pulmonary emboli. Mediastinum/Nodes: Small scattered mediastinal and hilar lymph nodes but no mass or overt adenopathy. The esophagus is grossly normal. Lungs/Pleura: Moderate breathing motion artifact. No definite acute pulmonary findings such as infiltrate, edema or effusion. No worrisome pulmonary lesions. Upper Abdomen: Small hiatal hernia noted. Partially calcified spleen. Status post cholecystectomy. Large right renal cyst. Moderate aortic calcifications. Chest wall/ Musculoskeletal: No breast masses, supraclavicular or axillary lymphadenopathy. There is massive left-sided thyromegaly also demonstrated on prior thyroid ultrasound examinations. Moderate deviation of the trachea rightward. No significant bony findings. Review of the MIP images confirms the above findings. IMPRESSION: 1. Examination is somewhat limited by breathing motion artifact and body  habitus. 2. No definite CT findings for pulmonary embolism. 3. Normal caliber thoracic aorta. Moderate atherosclerotic calcifications. Three-vessel coronary artery calcifications. 4. No significant pulmonary findings. 5. No significant upper abdominal findings. Aortic Atherosclerosis (ICD10-I70.0). Electronically Signed   By: Marijo Sanes M.D.   On: 01/13/2017 15:24     Not all labs, radiology exams or other studies done  during hospitalization come through on my EPIC note; however they are reviewed by me.    Assessment and Plan  SEIZURE/ altered MENTAL Status-felt to have a foci for possible seizures in right frontal lobe from encephalomalacia; the EEG showed background slowing; UDS and UA were negative; possibly caused by medications-trazodone, tramadol, Cymbalta, and Seroquel were all DC'd-patient was started on Keppra 500 mg twice a day and had no further seizure activity SNF -patient admitted for OT/PT. Continue Keppra 500 mg by mouth twice a day; no driving until seizure free for 6 months  ACUTE ON CKD3-improved with IV hydration; DC creatinine 1.25 SNF - will follow-up BMP in 5 days  CARDIOMYOPATHY-cardiac 2016 with ejection fraction 25-30% patient appears to be bulimic at this time SNF _ plan to continue Lasix 20 mg by mouth Toprol-XL 50 mg daily and lisinopril 10 mg by mouth daily  COPD-patient had an elevated d-dimer of 2.09, CTA was done which showed no PE or other acute findings; patient is not O2 dependent SNF -continue scheduled Flovent MDI 1 puff twice a day and when necessary albuterol  Type 2 diabetes mellitus-last hemoglobin A1c was 7.6 SNF - decent control; will continue NPH 30 units every morning and metformin 500 mg by mouth twice a day; patient is on ACE and a statin  HYPERTENSION SNF -will resume home meds of Toprol-XL 50 mg by mouth daily, lisinopril 10 mg by mouth daily Lasix 20 mg by mouth daily and Norvasc 10 mg by mouth daily  DEPRESSION SNF - patient was not put back on Cymbalta; will monitor. Mood  HYPERLIPIDEMIA SNF - controlled; continue Lipitor 20 mg by mouth daily  HYPOTHYROIDISM SNF - last TSH was 0.36; continue Synthroid 25 g by mouth daily    Time spent greater than 45 minutes;> 50% of time with patient was spent reviewing records, labs, tests and studies, counseling and developing plan of care  Webb Silversmith D. Sheppard Coil, MD

## 2017-01-22 ENCOUNTER — Encounter: Payer: Self-pay | Admitting: Internal Medicine

## 2017-01-22 DIAGNOSIS — E1169 Type 2 diabetes mellitus with other specified complication: Secondary | ICD-10-CM | POA: Insufficient documentation

## 2017-01-22 DIAGNOSIS — E785 Hyperlipidemia, unspecified: Secondary | ICD-10-CM

## 2017-01-22 DIAGNOSIS — I429 Cardiomyopathy, unspecified: Secondary | ICD-10-CM | POA: Insufficient documentation

## 2017-01-22 DIAGNOSIS — F319 Bipolar disorder, unspecified: Secondary | ICD-10-CM | POA: Insufficient documentation

## 2017-01-24 LAB — CBC AND DIFFERENTIAL
HCT: 35 — AB (ref 36–46)
Hemoglobin: 11.2 — AB (ref 12.0–16.0)
Platelets: 214 (ref 150–399)
WBC: 8.8

## 2017-01-24 LAB — BASIC METABOLIC PANEL
BUN: 35 — AB (ref 4–21)
Creatinine: 1.5 — AB (ref 0.5–1.1)
Glucose: 146
Potassium: 4.8 (ref 3.4–5.3)
Sodium: 139 (ref 137–147)

## 2017-01-27 ENCOUNTER — Other Ambulatory Visit: Payer: Self-pay

## 2017-01-31 ENCOUNTER — Non-Acute Institutional Stay (SKILLED_NURSING_FACILITY): Payer: Medicare Other | Admitting: Nurse Practitioner

## 2017-01-31 ENCOUNTER — Encounter: Payer: Self-pay | Admitting: Nurse Practitioner

## 2017-01-31 DIAGNOSIS — E1149 Type 2 diabetes mellitus with other diabetic neurological complication: Secondary | ICD-10-CM | POA: Diagnosis not present

## 2017-01-31 DIAGNOSIS — F321 Major depressive disorder, single episode, moderate: Secondary | ICD-10-CM

## 2017-01-31 DIAGNOSIS — R600 Localized edema: Secondary | ICD-10-CM | POA: Diagnosis not present

## 2017-01-31 DIAGNOSIS — I2510 Atherosclerotic heart disease of native coronary artery without angina pectoris: Secondary | ICD-10-CM

## 2017-01-31 DIAGNOSIS — D500 Iron deficiency anemia secondary to blood loss (chronic): Secondary | ICD-10-CM

## 2017-01-31 DIAGNOSIS — R569 Unspecified convulsions: Secondary | ICD-10-CM | POA: Diagnosis not present

## 2017-01-31 DIAGNOSIS — E031 Congenital hypothyroidism without goiter: Secondary | ICD-10-CM | POA: Diagnosis not present

## 2017-01-31 DIAGNOSIS — I5042 Chronic combined systolic (congestive) and diastolic (congestive) heart failure: Secondary | ICD-10-CM | POA: Diagnosis not present

## 2017-01-31 DIAGNOSIS — N183 Chronic kidney disease, stage 3 unspecified: Secondary | ICD-10-CM

## 2017-01-31 DIAGNOSIS — I255 Ischemic cardiomyopathy: Secondary | ICD-10-CM

## 2017-01-31 DIAGNOSIS — K219 Gastro-esophageal reflux disease without esophagitis: Secondary | ICD-10-CM

## 2017-01-31 DIAGNOSIS — F209 Schizophrenia, unspecified: Secondary | ICD-10-CM

## 2017-01-31 DIAGNOSIS — I10 Essential (primary) hypertension: Secondary | ICD-10-CM

## 2017-01-31 DIAGNOSIS — G2581 Restless legs syndrome: Secondary | ICD-10-CM

## 2017-01-31 DIAGNOSIS — M320 Drug-induced systemic lupus erythematosus: Secondary | ICD-10-CM

## 2017-01-31 NOTE — Assessment & Plan Note (Signed)
stable, taking Protonix 40mg  qd

## 2017-01-31 NOTE — Assessment & Plan Note (Signed)
treated with metformin and NPH insulin,

## 2017-01-31 NOTE — Assessment & Plan Note (Signed)
he is stabilized on Keppra, Gabapentin 300mg  bid, off Cymbalta, Trazodone, Seroquel.

## 2017-01-31 NOTE — Assessment & Plan Note (Signed)
taking Levothyroxine 25 mcg qd,

## 2017-01-31 NOTE — Assessment & Plan Note (Addendum)
Not apparent, continue Furosemide 20mg  daily.

## 2017-01-31 NOTE — Assessment & Plan Note (Signed)
controlled Norvasc Lasix lisinopril and metoprolol

## 2017-01-31 NOTE — Assessment & Plan Note (Signed)
Continue to manage her symptoms with Gabapentin.

## 2017-01-31 NOTE — Assessment & Plan Note (Addendum)
Compensated clinically. Continue Furosemide

## 2017-01-31 NOTE — Assessment & Plan Note (Signed)
Not being treated presently, off Seroquel since last hospitalization.

## 2017-01-31 NOTE — Assessment & Plan Note (Signed)
Not taking meds presently.

## 2017-01-31 NOTE — Assessment & Plan Note (Addendum)
Continue metoprolol XL , Lasix and lisinopril

## 2017-01-31 NOTE — Assessment & Plan Note (Signed)
Stable, last Hgb 11.2 01/24/17

## 2017-01-31 NOTE — Assessment & Plan Note (Addendum)
Continue ASA 81mg  qd, prn NTG, statin

## 2017-01-31 NOTE — Progress Notes (Signed)
Location:  [facility] Room Number: [place]:  SNF ([id number])  Provider: Mast, Manxie  NP  PCP: Zenia Resides, MD Patient Care Team: Zenia Resides, MD as PCP - General (Family Medicine) Inocencio Homes, DPM as Consulting Physician (Podiatry) Adrian Prows, MD as Consulting Physician (Cardiology) Inda Castle, MD as Consulting Physician (Gastroenterology)  Extended Emergency Contact Information Primary Emergency Contact: [name] Address: [address], [place] [address] [place] of [place] Phone: [phone] Relation: Daughter Secondary Emergency Contact: Kerby Moors, [place] [address] [place] Phone: [phone] Relation: Daughter  Code Status: DNR Goals of care:  Advanced Directive information Advanced Directives 01/31/2017  Does Patient Have a Medical Advance Directive? Yes  Type of Advance Directive Out of facility DNR (pink MOST or yellow form)  Does patient want to make changes to medical advance directive? No - Patient declined  Copy of [place] in Chart? -  Would patient like information on creating a medical advance directive? No - Patient declined  Pre-existing out of facility DNR order (yellow form or pink MOST form) Yellow form placed in chart (order not valid for inpatient use)     Allergies  Allergen Reactions  . Ace Inhibitors Other (See Comments)    Coincided with sig bump in creat. Retried and creat bumped again.  Beulah Gandy [Isosorb Dinitrate-Hydralazine] Other (See Comments)    Sleep all the time.  Ebbie Ridge [Buspirone] Other (See Comments)    pain  . Pregabalin Swelling  . Ropinirole Hydrochloride Swelling  . Amantadines Rash  . Penicillins Swelling and Rash    Has patient had a PCN reaction causing immediate rash, facial/tongue/throat swelling, SOB or lightheadedness with hypotension: Yes Has patient had a PCN reaction causing  severe rash involving mucus membranes or skin necrosis: Yes Has patient had a PCN reaction that required hospitalization: Yes Has patient had a PCN reaction occurring within the last 10 years: No If all of the above answers are "NO", then may proceed with Cephalosporin use.     Chief Complaint  Patient presents with  . Discharge Note    To home    HPI:  76 y.o. female discharge home with home health, PT, OT    The patient was admitted to SNF for rehab following hospitalization 01/13/17 to 01/18/17 for seizure. She is stabilized on Keppra, Gabapentin 300mg  bid, off Cymbalta, Trazodone, Seroquel.   Hx of COPD, Schizophrenia, hypothyroidism, taking Levothyroxine 25 mcg qd, depression, insomnia, SLE, chronic pian, Hx of TIA, blood pressure is controlled Norvasc Lasix lisinopril and metoprolol diabetes treated with metformin and NPH insulin, and cardiomyopathy treated with metoprolol XL , Lasix and lisinopril. GERD stable, taking Protonix 40mg  qd   Past Medical History:  Diagnosis Date  . Acute delirium 01/14/2017  . Anxiety   . Arthritis    "all over"  . Asthma   . Bipolar disorder ([place])   . Complication of anesthesia    "w/right foot OR they gave me too much and they couldn't get me woke"  . Congestive heart failure, unspecified   . Coronary artery calcification seen on CAT scan 01/14/2017  . Coronary artery disease    "I've got 1 stent" (06/30/2015)  . Depression   . Discoid lupus   . Fibromyalgia    "I've been told I have this; can't take Lyrica cause  I'm allergic to it" (06/30/2015)  . GERD (gastroesophageal reflux disease)   . History of gout   . History of hiatal hernia    "real bad" (06/30/2015)  . Hypertension   . Hypothyroidism   . On home oxygen therapy    "2L q hs" (06/30/2015)  . OSA (obstructive sleep apnea)    "just use my oxygen; no mask" (06/30/2015)  . Other and unspecified hyperlipidemia   . Penetrating foot wound    left nonhealing foot wound on the  dorsal surface  . Peripheral neuropathy   . Pneumonia "many of times"  . Refusal of blood transfusions as patient is Jehovah's Witness   . Renal failure, unspecified    "kidneys not working 100%" (06/30/2015)  . Sickle cell trait ([place])   . Systemic lupus ([place])   . Thoracic aortic atherosclerosis ([place]) 01/14/2017  . Type II diabetes mellitus ([place])   . Unspecified vitamin D deficiency     Past Surgical History:  Procedure Laterality Date  . ANKLE ARTHROSCOPY WITH OPEN REDUCTION INTERNAL FIXATION (ORIF) Right 03/2011  . CARDIAC CATHETERIZATION N/A 07/01/2015   Procedure: Left Heart Cath and Coronary Angiography;  Surgeon: Adrian Prows, MD;  Location: [facility];  Service: Cardiovascular;  Laterality: N/A;  . CARDIAC CATHETERIZATION N/A 07/01/2015   Procedure: Intravascular Pressure Wire/FFR Study;  Surgeon: Adrian Prows, MD;  Location: [facility];  Service: Cardiovascular;  Laterality: N/A;  . CARPAL TUNNEL RELEASE Bilateral   . CATARACT EXTRACTION W/ INTRAOCULAR LENS  IMPLANT, BILATERAL Bilateral   . CHOLECYSTECTOMY OPEN    . COLONOSCOPY N/A 12/10/2013   Procedure: COLONOSCOPY;  Surgeon: Inda Castle, MD;  Location: [facility];  Service: Endoscopy;  Laterality: N/A;  . FRACTURE SURGERY    . INCISION AND DRAINAGE ABSCESS Left    foot "under my little toe"  . KNEE LIGAMENT RECONSTRUCTION Right   . LEFT HEART CATHETERIZATION WITH CORONARY ANGIOGRAM N/A 08/08/2012   Procedure: LEFT HEART CATHETERIZATION WITH CORONARY ANGIOGRAM;  Surgeon: [name], MD;  [facility] C[facility] LAB;  Service: Cardiovascular;  Laterality: N/A;  . NASAL SINUS SURGERY    . PERCUTANEOUS CORONARY STENT INTERVENTION (PCI-S) N/A 08/21/2012   Procedure: PERCUTANEOUS CORONARY STENT INTERVENTION (PCI-S);  Surgeon: [name], MD;  Location: [facility] CATH LAB;  Service: Cardiovascular;  Laterality: N/A;  . TUBAL LIGATION        reports that she quit smoking about 18 years ago. Her smoking use included  Cigarettes. She started smoking about 56 years ago. She has a 120.00 pack-year smoking history. She has never used smokeless tobacco. She reports that she does not drink alcohol or use drugs. Social History   Social History  . Marital status: Single    Spouse name: N/A  . Number of children: 4  . Years of education: 79 th   Occupational History  . Retired- Engineer, production Retired   Social History Main Topics  . Smoking status: Former Smoker    Packs/day: 3.00    Years: 40.00    Types: Cigarettes    Start date: 07/05/1960    Quit date: 07/05/1998  . Smokeless tobacco: Never Used  . Alcohol use No     Comment: 06/30/2015 "used to drink a little alcohol on the weekends; not q weekend; nothing in years"  . Drug use: No  . Sexual activity: No   Other Topics Concern  . Not on file   Social History Narrative   Health Care  POA:    Emergency Contact: daughter, Lorenso Courier [phone] or [name] [phone]   End of Life Plan: Does not want to be ventilated or feeding tubes.    Who lives with you: Lives alone   Any pets: none   Diet: Patient reports enjoying and eating junk food.  Does not regulate types of food and currently her dentures are broken so it is hard to eat some foods.   Exercise: Patient does not have an exercise plan.   Seatbelts: Patient reports wearing seatbelt when in vehicle.   Sun Exposure/Protection: Patient reports not going outside very often.   Hobbies: Patient enjoys reading the bible and watching game shows.    Right-handed.   1 cup caffeine per day.   Former smoker-stopped 2000   Alcohol none      Functional Status Survey:    Allergies  Allergen Reactions  . Ace Inhibitors Other (See Comments)    Coincided with sig bump in creat. Retried and creat bumped again.  Beulah Gandy [Isosorb Dinitrate-Hydralazine] Other (See Comments)    Sleep all the time.  Ebbie Ridge [Buspirone] Other (See Comments)    pain  . Pregabalin Swelling  . Ropinirole  Hydrochloride Swelling  . Amantadines Rash  . Penicillins Swelling and Rash    Has patient had a PCN reaction causing immediate rash, facial/tongue/throat swelling, SOB or lightheadedness with hypotension: Yes Has patient had a PCN reaction causing severe rash involving mucus membranes or skin necrosis: Yes Has patient had a PCN reaction that required hospitalization: Yes Has patient had a PCN reaction occurring within the last 10 years: No If all of the above answers are "NO", then may proceed with Cephalosporin use.     Pertinent  Health Maintenance Due  Topic Date Due  . OPHTHALMOLOGY EXAM  10/31/2012  . INFLUENZA VACCINE  02/02/2017  . HEMOGLOBIN A1C  07/18/2017  . FOOT EXAM  09/08/2017  . DEXA SCAN  Completed  . PNA vac Low Risk Adult  Completed    Medications: Outpatient Encounter Prescriptions as of 01/31/2017  Medication Sig  . acetaminophen (TYLENOL) 325 MG tablet Take 650 mg by mouth every 6 (six) hours as needed for mild pain or moderate pain.  Marland Kitchen albuterol (PROAIR HFA) 108 (90 Base) MCG/ACT inhaler inhale 2 puffs by mouth every 4 hours if needed USE ONLY IF YOU ARE WHEEZING  . amLODipine (NORVASC) 10 MG tablet Take 1 tablet (10 mg total) by mouth daily.  . Ascorbic Acid (VITAMIN C PO) Take 1 tablet by mouth daily.  Marland Kitchen aspirin EC 81 MG tablet Take 162 mg by mouth daily.   Marland Kitchen atorvastatin (LIPITOR) 20 MG tablet Take 1 tablet (20 mg total) by mouth daily.  . B Complex Vitamins (VITAMIN B COMPLEX) TABS Take 1 tablet by mouth daily.  . Cholecalciferol (VITAMIN D3) 5000 UNITS CAPS Take 5,000 Units by mouth daily.  . Flaxseed, Linseed, (FLAXSEED OIL PO) Take 1 capsule by mouth daily.  . fluticasone (FLOVENT HFA) 220 MCG/ACT inhaler inhale 1 puff by mouth twice a day  . furosemide (LASIX) 20 MG tablet Take 1 tablet (20 mg total) by mouth daily.  Marland Kitchen gabapentin (NEURONTIN) 300 MG capsule Take 1 capsule (300 mg total) by mouth 2 (two) times daily.  Marland Kitchen guaiFENesin (MUCINEX) 600 MG 12 hr  tablet Take 1 tablet (600 mg total) by mouth 2 (two) times daily.  . Insulin NPH, Human,, Isophane, (HUMULIN N KWIKPEN) 100 UNIT/ML Kiwkpen Inject 30 Units into the skin every morning.  Marland Kitchen  Insulin Pen Needle (PEN NEEDLES 31GX5/16") 31G X 8 MM MISC Check Blood sugar three times per day. Dx insulin using E11.8  . levETIRAcetam (KEPPRA) 500 MG tablet Take 1 tablet (500 mg total) by mouth 2 (two) times daily.  Marland Kitchen levothyroxine (SYNTHROID, LEVOTHROID) 25 MCG tablet Take 25 mcg by mouth daily.  Marland Kitchen lisinopril (PRINIVIL,ZESTRIL) 10 MG tablet Take 10 mg by mouth daily.  . metFORMIN (GLUCOPHAGE) 500 MG tablet Take 1 tablet (500 mg total) by mouth 2 (two) times daily with a meal.  . metoprolol succinate (TOPROL-XL) 50 MG 24 hr tablet TAKE ONE TABLET BY MOUTH ONCE DAILY WITH OR IMMEDIATELY FOLLOWING A MEAL  . Multiple Vitamin (MULTIVITAMIN WITH MINERALS) TABS tablet Take 1 tablet by mouth daily.  . nitroGLYCERIN (NITROSTAT) 0.4 MG SL tablet Place 1 tablet (0.4 mg total) under the tongue every 5 (five) minutes as needed for chest pain.  . pantoprazole (PROTONIX) 40 MG tablet Take 1 tablet (40 mg total) by mouth daily.  . psyllium (METAMUCIL SMOOTH TEXTURE) 28 % packet Take 1 packet by mouth 2 (two) times daily.  . [DISCONTINUED] mometasone (ELOCON) 0.1 % ointment Apply topically daily.   No facility-administered encounter medications on file as of 01/31/2017.     Review of Systems  Constitutional: Negative for activity change, appetite change, chills, diaphoresis, fatigue and fever.       Obese  HENT: Negative for congestion and hearing loss.   Eyes: Negative for visual disturbance.  Respiratory: Negative for apnea, cough, shortness of breath and wheezing.   Cardiovascular: Negative for chest pain, palpitations and leg swelling.  Gastrointestinal: Negative for abdominal distention, abdominal pain and constipation.  Endocrine: Negative for polyuria.  Genitourinary: Negative for difficulty urinating, dysuria,  frequency and urgency.  Musculoskeletal: Positive for gait problem. Negative for arthralgias, back pain and joint swelling.  Skin: Negative for color change, pallor, rash and wound.  Neurological: Negative for dizziness, speech difficulty, weakness and headaches.  Psychiatric/Behavioral: Negative for agitation, behavioral problems and confusion.    Vitals:   01/31/17 1534  BP: 130/63  Pulse: 68  Resp: 18  Temp: 98.4 F (36.9 C)  SpO2: 97%  Weight: 262 lb (118.8 kg)  Height: 5\' 3"  (1.6 m)   Body mass index is 46.41 kg/m. Physical Exam  Constitutional: She is oriented to person, place, and time. She appears well-developed and well-nourished.  HENT:  Head: Normocephalic and atraumatic.  Eyes: Pupils are equal, round, and reactive to light. Conjunctivae and EOM are normal.  Neck: Normal range of motion. Neck supple.  Cardiovascular: Normal rate, regular rhythm and normal heart sounds.   Pulmonary/Chest: Effort normal. No respiratory distress. She has no wheezes.  Abdominal: Soft. Bowel sounds are normal. She exhibits no distension.  Musculoskeletal: Normal range of motion. She exhibits no edema or tenderness.  Neurological: She is alert and oriented to person, place, and time.  Skin: Skin is warm and dry.  Psychiatric: She has a normal mood and affect.    Labs reviewed: Basic Metabolic Panel:  Recent Labs  01/16/17 0406 01/17/17 0416 01/18/17 0554 01/24/17  NA 138 137 135 139  K 3.9 3.9 3.8 4.8  CL 109 106 105  --   CO2 20* 24 23  --   GLUCOSE 136* 133* 182*  --   BUN 22* 17 15 35*  CREATININE 1.72* 1.29* 1.25* 1.5*  CALCIUM 8.3* 8.2* 8.4*  --    Liver Function Tests:  Recent Labs  09/08/16 1121 01/13/17 1200  AST 18  32  ALT 12 15  ALKPHOS 146* 132*  BILITOT 0.3 0.8  PROT 7.9 8.6*  ALBUMIN 3.6 3.6   No results for input(s): LIPASE, AMYLASE in the last 8760 hours. No results for input(s): AMMONIA in the last 8760 hours. CBC:  Recent Labs   01/13/17 1200  01/16/17 0406 01/17/17 0416 01/18/17 0554 01/24/17  WBC 13.5*  < > 11.7* 12.1* 11.3* 8.8  NEUTROABS 12.0*  --   --   --   --   --   HGB 12.7  < > 11.7* 11.6* 12.0 11.2*  HCT 37.5  < > 34.2* 34.1* 35.4* 35*  MCV 71.0*  < > 70.4* 70.9* 69.5*  --   PLT 244  < > 206 216 211 214  < > = values in this interval not displayed. Cardiac Enzymes: No results for input(s): CKTOTAL, CKMB, CKMBINDEX, TROPONINI in the last 8760 hours. BNP: Invalid input(s): POCBNP CBG:  Recent Labs  01/17/17 2107 01/18/17 0608 01/18/17 1118  GLUCAP 193* 186* 131*    Procedures and Imaging Studies During Stay: Dg Chest 2 View  Result Date: 01/13/2017 CLINICAL DATA:  Altered mental status EXAM: CHEST  2 VIEW COMPARISON:  08/01/2010 FINDINGS: There is no focal consolidation. There are low lung volumes. There is bilateral interstitial prominence and prominence of the central pulmonary vasculature likely accentuated by low lung volumes. There is no pleural effusion or pneumothorax. There is stable cardiomegaly. The osseous structures are unremarkable. IMPRESSION: Cardiomegaly.  No evidence of CHF. Electronically Signed   By: Kathreen Devoid   On: 01/13/2017 11:55   Ct Head Wo Contrast  Result Date: 01/13/2017 CLINICAL DATA:  Seizure activity. EXAM: CT HEAD WITHOUT CONTRAST TECHNIQUE: Contiguous axial images were obtained from the base of the skull through the vertex without intravenous contrast. COMPARISON:  MRI head 02/21/2015.  Head CT 08/01/2014. FINDINGS: Brain: Hypo attenuation in the posterior right frontal lobe is probably encephalomalacia related to prior infarct although there is some preservation of the overlying cortex. No evidence for acute hemorrhage, hydrocephalus, or abnormal extra-axial fluid collection. Diffuse loss of parenchymal volume is consistent with atrophy. Patchy low attenuation in the deep hemispheric and periventricular white matter is nonspecific, but likely reflects chronic  microvascular ischemic demyelination. Vascular: No hyperdense vessel or unexpected calcification. Skull: No evidence for fracture. No worrisome lytic or sclerotic lesion. Sinuses/Orbits: The visualized paranasal sinuses and mastoid air cells are clear. Visualized portions of the globes and intraorbital fat are unremarkable. Other: None. IMPRESSION: 1. Focal hypo attenuation posterior right frontal lobe is new since the 2016 exams and probably related to interval nonacute infarct given subtle ex vacuo dilatation right frontal horn. There is some preservation of the overlying cortex and although vasogenic edema is considered less likely, MRI of the brain is suggested for more definitive characterization. 2. Diffuse atrophy with chronic small vessel white matter ischemic disease. 3. Electronically Signed   By: Misty Stanley M.D.   On: 01/13/2017 12:42   Ct Angio Chest Pe W And/or Wo Contrast  Result Date: 01/13/2017 CLINICAL DATA:  Seizures, shortness of breath, lethargy and confusion. History of sickle cell disease. EXAM: CT ANGIOGRAPHY CHEST WITH CONTRAST TECHNIQUE: Multidetector CT imaging of the chest was performed using the standard protocol during bolus administration of intravenous contrast. Multiplanar CT image reconstructions and MIPs were obtained to evaluate the vascular anatomy. CONTRAST:  100 cc Omnipaque 370 COMPARISON:  None. FINDINGS: Cardiovascular: The heart is mildly enlarged. No pericardial effusion. The aorta is normal in caliber.  Mild tortuosity and moderate atherosclerotic calcifications. Coronary artery calcifications are noted. The pulmonary arterial tree is fairly well opacified. There is moderate breathing motion artifact but no definite large central pulmonary emboli. Mediastinum/Nodes: Small scattered mediastinal and hilar lymph nodes but no mass or overt adenopathy. The esophagus is grossly normal. Lungs/Pleura: Moderate breathing motion artifact. No definite acute pulmonary findings  such as infiltrate, edema or effusion. No worrisome pulmonary lesions. Upper Abdomen: Small hiatal hernia noted. Partially calcified spleen. Status post cholecystectomy. Large right renal cyst. Moderate aortic calcifications. Chest wall/ Musculoskeletal: No breast masses, supraclavicular or axillary lymphadenopathy. There is massive left-sided thyromegaly also demonstrated on prior thyroid ultrasound examinations. Moderate deviation of the trachea rightward. No significant bony findings. Review of the MIP images confirms the above findings. IMPRESSION: 1. Examination is somewhat limited by breathing motion artifact and body habitus. 2. No definite CT findings for pulmonary embolism. 3. Normal caliber thoracic aorta. Moderate atherosclerotic calcifications. Three-vessel coronary artery calcifications. 4. No significant pulmonary findings. 5. No significant upper abdominal findings. Aortic Atherosclerosis (ICD10-I70.0). Electronically Signed   By: Marijo Sanes M.D.   On: 01/13/2017 15:24   Mr Jeri Cos UV Contrast  Result Date: 01/16/2017 CLINICAL DATA:  Altered mental status.  Seizures. EXAM: MRI HEAD WITHOUT AND WITH CONTRAST TECHNIQUE: Multiplanar, multiecho pulse sequences of the brain and surrounding structures were obtained without and with intravenous contrast. CONTRAST:  MULTIHANCE GADOBENATE DIMEGLUMINE 529 MG/ML IV SOLN COMPARISON:  CT head without contrast 01/13/2017. MRI brain 02/21/2015. FINDINGS: Brain: Focal encephalomalacia is present in the right frontal operculum. There is some involvement of the superior insular cortex. There is no associated hemorrhage. Volume loss is present with ex vacuo dilation of the right lateral ventricle. Surrounding white matter gliosis is noted. No acute infarct is present. Moderate left periventricular T2 changes are evident as well. The brainstem and cerebellum are normal. The internal auditory canals are within normal limits. Vascular: Flow is present in the major  intracranial arteries. Skull and upper cervical spine: The skullbase is within normal limits. The craniocervical junction is normal. A relatively empty sella is again seen. The craniocervical junction is within normal limits. The upper cervical spine is within normal limits. Midline sagittal structures are otherwise unremarkable. Sinuses/Orbits: There is chronic opacification of the right maxillary sinus with a shrunken sinus. Marked wall thickening is noted. No active disease is present. The paranasal sinuses and mastoid air cells are otherwise clear. IMPRESSION: 1. Encephalomalacia involving the right frontal operculum is remote. This could serve as a seizure focus. 2. No acute intracranial abnormality. 3. Other diffuse periventricular white matter changes bilaterally are mildly advanced for age. This likely reflects the sequela of chronic microvascular ischemia. 4. Chronic right maxillary sinus disease without active disease. Electronically Signed   By: San Morelle M.D.   On: 01/16/2017 13:46    Assessment/Plan:   Essential hypertension controlled Norvasc Lasix lisinopril and metoprolol  Coronary atherosclerosis Continue ASA 81mg  qd, prn NTG, statin  Chronic combined systolic and diastolic CHF, NYHA class 2 ([place]) Compensated clinically. Continue Furosemide  Cardiomyopathy ([place]) Continue metoprolol XL , Lasix and lisinopril  GERD stable, taking Protonix 40mg  qd   Hypothyroidism taking Levothyroxine 25 mcg qd,   DM (diabetes mellitus), type 2 with neurological complications ([place]) treated with metformin and NPH insulin,   Seizure ([place]) he is stabilized on Keppra, Gabapentin 300mg  bid, off Cymbalta, Trazodone, Seroquel.    Bilateral leg edema Not apparent, continue Furosemide 20mg  daily.   Chronic kidney disease, stage III (moderate)  Creat 1.5 01/24/17 down from 1.72 01/16/17  Anemia Stable, last Hgb 11.2 01/24/17  Depression Stable.   RESTLESS LEG SYNDROME Continue to  manage her symptoms with Gabapentin.   Systemic lupus ([place]) Not taking meds presently.   Schizophrenia, unspecified type ([place]) Not being treated presently, off Seroquel since last hospitalization.      Patient is being discharged with the following home health services:    Patient is being discharged with the following durable medical equipment:    Patient has been advised to f/u with their PCP in 1-2 weeks to for a transitions of care visit.  Social services at their facility was responsible for arranging this appointment.  Pt was provided with adequate prescriptions of noncontrolled medications to reach the scheduled appointment .  For controlled substances, a limited supply was provided as appropriate for the individual patient.  If the pt normally receives these medications from a pain clinic or has a contract with another physician, these medications should be received from that clinic or physician only).    Future labs/tests needed:  None, dc home [place] with PT/OT/Home Health.

## 2017-01-31 NOTE — Assessment & Plan Note (Signed)
Stable

## 2017-01-31 NOTE — Assessment & Plan Note (Signed)
Creat 1.5 01/24/17 down from 1.72 01/16/17

## 2017-02-02 ENCOUNTER — Other Ambulatory Visit: Payer: Self-pay | Admitting: Family Medicine

## 2017-02-03 DIAGNOSIS — N189 Chronic kidney disease, unspecified: Secondary | ICD-10-CM | POA: Diagnosis not present

## 2017-02-03 DIAGNOSIS — L930 Discoid lupus erythematosus: Secondary | ICD-10-CM | POA: Diagnosis not present

## 2017-02-03 DIAGNOSIS — N179 Acute kidney failure, unspecified: Secondary | ICD-10-CM | POA: Diagnosis not present

## 2017-02-03 DIAGNOSIS — M1990 Unspecified osteoarthritis, unspecified site: Secondary | ICD-10-CM | POA: Diagnosis not present

## 2017-02-03 DIAGNOSIS — J449 Chronic obstructive pulmonary disease, unspecified: Secondary | ICD-10-CM | POA: Diagnosis not present

## 2017-02-03 DIAGNOSIS — I509 Heart failure, unspecified: Secondary | ICD-10-CM | POA: Diagnosis not present

## 2017-02-03 DIAGNOSIS — I129 Hypertensive chronic kidney disease with stage 1 through stage 4 chronic kidney disease, or unspecified chronic kidney disease: Secondary | ICD-10-CM | POA: Diagnosis not present

## 2017-02-03 DIAGNOSIS — M797 Fibromyalgia: Secondary | ICD-10-CM | POA: Diagnosis not present

## 2017-02-03 DIAGNOSIS — R569 Unspecified convulsions: Secondary | ICD-10-CM | POA: Diagnosis not present

## 2017-02-03 DIAGNOSIS — G3184 Mild cognitive impairment, so stated: Secondary | ICD-10-CM | POA: Diagnosis not present

## 2017-02-03 DIAGNOSIS — I422 Other hypertrophic cardiomyopathy: Secondary | ICD-10-CM | POA: Diagnosis not present

## 2017-02-03 DIAGNOSIS — R262 Difficulty in walking, not elsewhere classified: Secondary | ICD-10-CM | POA: Diagnosis not present

## 2017-02-03 DIAGNOSIS — E1140 Type 2 diabetes mellitus with diabetic neuropathy, unspecified: Secondary | ICD-10-CM | POA: Diagnosis not present

## 2017-02-04 ENCOUNTER — Other Ambulatory Visit: Payer: Self-pay | Admitting: Family Medicine

## 2017-02-04 NOTE — Telephone Encounter (Signed)
Brittany Roberts  Did not refill her lisinopril as has acei listed as allergy and said Historical provider on last Rx  Welcome back!

## 2017-02-08 DIAGNOSIS — R262 Difficulty in walking, not elsewhere classified: Secondary | ICD-10-CM | POA: Diagnosis not present

## 2017-02-08 DIAGNOSIS — N179 Acute kidney failure, unspecified: Secondary | ICD-10-CM | POA: Diagnosis not present

## 2017-02-08 DIAGNOSIS — G3184 Mild cognitive impairment, so stated: Secondary | ICD-10-CM | POA: Diagnosis not present

## 2017-02-08 DIAGNOSIS — I422 Other hypertrophic cardiomyopathy: Secondary | ICD-10-CM | POA: Diagnosis not present

## 2017-02-08 DIAGNOSIS — M1990 Unspecified osteoarthritis, unspecified site: Secondary | ICD-10-CM | POA: Diagnosis not present

## 2017-02-08 DIAGNOSIS — R569 Unspecified convulsions: Secondary | ICD-10-CM | POA: Diagnosis not present

## 2017-02-08 DIAGNOSIS — I129 Hypertensive chronic kidney disease with stage 1 through stage 4 chronic kidney disease, or unspecified chronic kidney disease: Secondary | ICD-10-CM | POA: Diagnosis not present

## 2017-02-08 DIAGNOSIS — E1140 Type 2 diabetes mellitus with diabetic neuropathy, unspecified: Secondary | ICD-10-CM | POA: Diagnosis not present

## 2017-02-08 DIAGNOSIS — J449 Chronic obstructive pulmonary disease, unspecified: Secondary | ICD-10-CM | POA: Diagnosis not present

## 2017-02-08 DIAGNOSIS — L930 Discoid lupus erythematosus: Secondary | ICD-10-CM | POA: Diagnosis not present

## 2017-02-08 DIAGNOSIS — I509 Heart failure, unspecified: Secondary | ICD-10-CM | POA: Diagnosis not present

## 2017-02-08 DIAGNOSIS — N189 Chronic kidney disease, unspecified: Secondary | ICD-10-CM | POA: Diagnosis not present

## 2017-02-08 DIAGNOSIS — M797 Fibromyalgia: Secondary | ICD-10-CM | POA: Diagnosis not present

## 2017-02-09 ENCOUNTER — Other Ambulatory Visit: Payer: Self-pay | Admitting: Family Medicine

## 2017-02-10 DIAGNOSIS — M1990 Unspecified osteoarthritis, unspecified site: Secondary | ICD-10-CM | POA: Diagnosis not present

## 2017-02-10 DIAGNOSIS — J449 Chronic obstructive pulmonary disease, unspecified: Secondary | ICD-10-CM | POA: Diagnosis not present

## 2017-02-10 DIAGNOSIS — L930 Discoid lupus erythematosus: Secondary | ICD-10-CM | POA: Diagnosis not present

## 2017-02-10 DIAGNOSIS — R262 Difficulty in walking, not elsewhere classified: Secondary | ICD-10-CM | POA: Diagnosis not present

## 2017-02-10 DIAGNOSIS — I129 Hypertensive chronic kidney disease with stage 1 through stage 4 chronic kidney disease, or unspecified chronic kidney disease: Secondary | ICD-10-CM | POA: Diagnosis not present

## 2017-02-10 DIAGNOSIS — I509 Heart failure, unspecified: Secondary | ICD-10-CM | POA: Diagnosis not present

## 2017-02-10 DIAGNOSIS — E1140 Type 2 diabetes mellitus with diabetic neuropathy, unspecified: Secondary | ICD-10-CM | POA: Diagnosis not present

## 2017-02-10 DIAGNOSIS — M797 Fibromyalgia: Secondary | ICD-10-CM | POA: Diagnosis not present

## 2017-02-10 DIAGNOSIS — G3184 Mild cognitive impairment, so stated: Secondary | ICD-10-CM | POA: Diagnosis not present

## 2017-02-10 DIAGNOSIS — N189 Chronic kidney disease, unspecified: Secondary | ICD-10-CM | POA: Diagnosis not present

## 2017-02-10 DIAGNOSIS — R569 Unspecified convulsions: Secondary | ICD-10-CM | POA: Diagnosis not present

## 2017-02-10 DIAGNOSIS — I422 Other hypertrophic cardiomyopathy: Secondary | ICD-10-CM | POA: Diagnosis not present

## 2017-02-10 DIAGNOSIS — N179 Acute kidney failure, unspecified: Secondary | ICD-10-CM | POA: Diagnosis not present

## 2017-02-11 DIAGNOSIS — J45909 Unspecified asthma, uncomplicated: Secondary | ICD-10-CM | POA: Diagnosis not present

## 2017-02-14 NOTE — Progress Notes (Signed)
HPI:   Brittany.Brittany Roberts is a 76 y.o. female, who is here today with her daughter to establish care.  Former PCP: Dr Brittany Roberts. Last preventive routine visit: 03/2016.  Chronic medical problems: COPD,schizophrenia, hypothyroidism, depression,chronic pain, GERD, and HTN among some. She denies Hx of schizophrenia. States that she was Dx after one episode of visual hallucination she reported.   Recently discharged from SNF home, 7/301/18. 01/2017 Dx with seizure disorder, she is on Keppra 500 mg am and 1000 mg at night.  States that "convulsive medication" is causing numbness on toes, bilateral 5th toes.   She lives alone She uses a walker , rest of ADL's independent. She is not driving. HH and PT at home 2 times per week, OT once per week.   Diabetes Mellitus II:   Dx many years ago. Currently on Metformin 500 mg 2 tabs bid and NPH insulin 30 U am.  Checking BS's : Mid 100's-170's. Hypoglycemia:Denies  She is tolerating medications well. Denies polydipsia, polyuria, or polyphagia. Hx of peripheral neuropathy.   Lab Results  Component Value Date   CREATININE 1.5 (A) 01/24/2017   BUN 35 (A) 01/24/2017   NA 139 01/24/2017   K 4.8 01/24/2017   CL 105 01/18/2017   CO2 23 01/18/2017    Lab Results  Component Value Date   HGBA1C 7.2 (H) 01/15/2017   Lab Results  Component Value Date   MICROALBUR 1.82 11/23/2013   HTN and CKD III:  Currently on Amlodipine 10 mg daily ,Metoprolol Succinate 50 mg daily, and Lisinopril 10 mg daily. Last eye exam "many years ago." Denies severe/frequent headache, visual changes, chest pain, dyspnea, palpitation, claudication, focal weakness, or edema.  BP's 130's-150's/70-80's.   Chronic pain disorder: She was on Tramadol before. She has Hx of OA, she follows with Dr Brittany Roberts, ortho.  Hypothyroidism:  Currently she is on Levothyroxine 25 mcg daily. Tolerating medication well, no side effects reported. She has not noted  dysphagia, palpitations, abdominal pain, changes in bowel habits, tremor, cold/heat intolerance, or abnormal weight loss.  Lab Results  Component Value Date   TSH 0.404 01/15/2017   She has a list of Dx and has questions about meaning some some: Cardiomyopathy,unspecified convulsion,and atherosclerotic heart disease among some.  She is also reporting Hx of systemic lupus. She has scalp alopecia and hypopigmentations, she attributes these to lupus, she is requesting referral to specialist. Dx in 1971, she has not seen rheuma and has not been on treatment for lupus.  She is also c/o dyspnea, states that is is "always" SOB. Hx of COPD. She is currently on Flovent bid and Albuterol inh. Her daughter has not noted dyspnea, she also is not sure if Brittany Roberts is taking inh as instructed. She uses Albuterol 3-4 times per week.    Review of Systems  Constitutional: Positive for fatigue. Negative for activity change, appetite change and fever.  HENT: Negative for mouth sores, nosebleeds, sore throat and trouble swallowing.   Eyes: Negative for redness and visual disturbance.  Respiratory: Positive for shortness of breath. Negative for cough and wheezing.   Cardiovascular: Negative for chest pain, palpitations and leg swelling.  Gastrointestinal: Negative for abdominal pain, nausea and vomiting.       Negative for changes in bowel habits.  Endocrine: Negative for cold intolerance, heat intolerance, polydipsia, polyphagia and polyuria.  Genitourinary: Negative for decreased urine volume, dysuria, hematuria, pelvic pain and vaginal bleeding.  Musculoskeletal: Positive for arthralgias and gait problem.  Skin: Negative for rash and wound.  Allergic/Immunologic: Positive for environmental allergies.  Neurological: Positive for numbness. Negative for seizures, syncope, weakness and headaches.  Psychiatric/Behavioral: Negative for confusion and sleep disturbance. The patient is nervous/anxious.        Current Outpatient Prescriptions on File Prior to Visit  Medication Sig Dispense Refill  . ACCU-CHEK AVIVA PLUS test strip TEST three times a day 300 each 3  . acetaminophen (TYLENOL) 325 MG tablet Take 650 mg by mouth every 6 (six) hours as needed for mild pain or moderate pain.    Marland Kitchen albuterol (PROAIR HFA) 108 (90 Base) MCG/ACT inhaler inhale 2 puffs by mouth every 4 hours if needed USE ONLY IF YOU ARE WHEEZING 3 Inhaler 3  . amLODipine (NORVASC) 10 MG tablet Take 1 tablet (10 mg total) by mouth daily. 90 tablet 3  . Ascorbic Acid (VITAMIN C PO) Take 1 tablet by mouth daily.    Marland Kitchen aspirin EC 81 MG tablet Take 162 mg by mouth daily.     Marland Kitchen atorvastatin (LIPITOR) 20 MG tablet Take 1 tablet (20 mg total) by mouth daily. 90 tablet 3  . B Complex Vitamins (VITAMIN B COMPLEX) TABS Take 1 tablet by mouth daily.    . Cholecalciferol (VITAMIN D3) 5000 UNITS CAPS Take 5,000 Units by mouth daily.    . Flaxseed, Linseed, (FLAXSEED OIL PO) Take 1 capsule by mouth daily.    . fluticasone (FLOVENT HFA) 220 MCG/ACT inhaler inhale 1 puff by mouth twice a day 36 g 3  . furosemide (LASIX) 20 MG tablet take 1 tablet by mouth once daily 90 tablet 1  . gabapentin (NEURONTIN) 300 MG capsule Take 1 capsule (300 mg total) by mouth 2 (two) times daily. 180 capsule 3  . guaiFENesin (MUCINEX) 600 MG 12 hr tablet Take 1 tablet (600 mg total) by mouth 2 (two) times daily. 20 tablet 0  . Insulin NPH, Human,, Isophane, (HUMULIN N KWIKPEN) 100 UNIT/ML Kiwkpen Inject 30 Units into the skin every morning. 15 mL 3  . Insulin Pen Needle (PEN NEEDLES 31GX5/16") 31G X 8 MM MISC Check Blood sugar three times per day. Dx insulin using E11.8 100 each 3  . levETIRAcetam (KEPPRA) 500 MG tablet Take 1 tablet (500 mg total) by mouth 2 (two) times daily. 60 tablet 0  . levothyroxine (SYNTHROID, LEVOTHROID) 25 MCG tablet Take 25 mcg by mouth daily.  3  . lisinopril (PRINIVIL,ZESTRIL) 10 MG tablet Take 10 mg by mouth daily.    .  metFORMIN (GLUCOPHAGE) 500 MG tablet Take 1 tablet (500 mg total) by mouth 2 (two) times daily with a meal. 180 tablet 3  . metoprolol succinate (TOPROL-XL) 50 MG 24 hr tablet TAKE ONE TABLET BY MOUTH ONCE DAILY WITH OR IMMEDIATELY FOLLOWING A MEAL 90 tablet 3  . Multiple Vitamin (MULTIVITAMIN WITH MINERALS) TABS tablet Take 1 tablet by mouth daily.    . nitroGLYCERIN (NITROSTAT) 0.4 MG SL tablet Place 1 tablet (0.4 mg total) under the tongue every 5 (five) minutes as needed for chest pain. 20 tablet 12  . pantoprazole (PROTONIX) 40 MG tablet Take 1 tablet (40 mg total) by mouth daily. 90 tablet 3  . psyllium (METAMUCIL SMOOTH TEXTURE) 28 % packet Take 1 packet by mouth 2 (two) times daily. 30 packet 12   No current facility-administered medications on file prior to visit.      Past Medical History:  Diagnosis Date  . Acute delirium 01/14/2017  . Anxiety   . Arthritis    "  all over"  . Asthma   . Bipolar disorder ([place])   . Complication of anesthesia    "w/right foot OR they gave me too much and they couldn't get me woke"  . Congestive heart failure, unspecified   . Coronary artery calcification seen on CAT scan 01/14/2017  . Coronary artery disease    "I've got 1 stent" (06/30/2015)  . Depression   . Discoid lupus   . Fibromyalgia    "I've been told I have this; can't take Lyrica cause I'm allergic to it" (06/30/2015)  . GERD (gastroesophageal reflux disease)   . History of gout   . History of hiatal hernia    "real bad" (06/30/2015)  . Hypertension   . Hypothyroidism   . On home oxygen therapy    "2L q hs" (06/30/2015)  . OSA (obstructive sleep apnea)    "just use my oxygen; no mask" (06/30/2015)  . Other and unspecified hyperlipidemia   . Penetrating foot wound    left nonhealing foot wound on the dorsal surface  . Peripheral neuropathy   . Pneumonia "many of times"  . Refusal of blood transfusions as patient is Jehovah's Witness   . Renal failure, unspecified    "kidneys  not working 100%" (06/30/2015)  . Sickle cell trait ([place])   . Systemic lupus ([place])   . Thoracic aortic atherosclerosis ([place]) 01/14/2017  . Type II diabetes mellitus ([place])   . Unspecified vitamin D deficiency    Allergies  Allergen Reactions  . Ace Inhibitors Other (See Comments)    Coincided with sig bump in creat. Retried and creat bumped again.  Beulah Gandy [Isosorb Dinitrate-Hydralazine] Other (See Comments)    Sleep all the time.  Ebbie Ridge [Buspirone] Other (See Comments)    pain  . Pregabalin Swelling  . Ropinirole Hydrochloride Swelling  . Amantadines Rash  . Penicillins Swelling and Rash    Has patient had a PCN reaction causing immediate rash, facial/tongue/throat swelling, SOB or lightheadedness with hypotension: Yes Has patient had a PCN reaction causing severe rash involving mucus membranes or skin necrosis: Yes Has patient had a PCN reaction that required hospitalization: Yes Has patient had a PCN reaction occurring within the last 10 years: No If all of the above answers are "NO", then may proceed with Cephalosporin use.     Family History  Problem Relation Age of Onset  . Heart disease Mother   . Diabetes Mother   . Clotting disorder Mother   . Pneumonia Father   . Rheum arthritis Father   . Diabetes Sister   . Diabetes Sister   . Asthma Brother   . Cancer Brother   . Kidney disease Brother   . Lupus Son   . Heart disease Son   . Heart disease Daughter   . Colon cancer Neg Hx     Social History   Social History  . Marital status: Single    Spouse name: N/A  . Number of children: 4  . Years of education: 20 th   Occupational History  . Retired- Engineer, production Retired   Social History Main Topics  . Smoking status: Former Smoker    Packs/day: 3.00    Years: 40.00    Types: Cigarettes    Start date: 07/05/1960    Quit date: 07/05/1998  . Smokeless tobacco: Never Used  . Alcohol use No     Comment: 06/30/2015 "used to drink a little alcohol  on the weekends; not q weekend;  nothing in years"  . Drug use: No  . Sexual activity: No   Other Topics Concern  . None   Social History Narrative   Health Care POA:    Emergency Contact: daughter, Lorenso Courier [phone] or [name] [phone]   End of Life Plan: Does not want to be ventilated or feeding tubes.    Who lives with you: Lives alone   Any pets: none   Diet: Patient reports enjoying and eating junk food.  Does not regulate types of food and currently her dentures are broken so it is hard to eat some foods.   Exercise: Patient does not have an exercise plan.   Seatbelts: Patient reports wearing seatbelt when in vehicle.   Sun Exposure/Protection: Patient reports not going outside very often.   Hobbies: Patient enjoys reading the bible and watching game shows.    Right-handed.   1 cup caffeine per day.   Former smoker-stopped 2000   Alcohol none       Vitals:   02/15/17 1447  BP: 120/70  Pulse: 82  Resp: 12  SpO2: 95%    Body mass index is 48.54 kg/m.  Physical Exam  Nursing note and vitals reviewed. Constitutional: She is oriented to person, place, and time. She appears well-developed. No distress.  HENT:  Head: Normocephalic and atraumatic.  Mouth/Throat: Oropharynx is clear and moist. Mucous membranes are dry.  Edentulous   Eyes: Pupils are equal, round, and reactive to light. Conjunctivae and EOM are normal.  Neck: No tracheal deviation present. Thyromegaly (mild/Hx of goiter) present. No thyroid mass present.  Cardiovascular: Normal rate.  An irregular rhythm present.  No murmur heard. DP pulses present bilateral.  Respiratory: Effort normal and breath sounds normal. No respiratory distress.  GI: Soft. There is no hepatomegaly. There is no tenderness.  Musculoskeletal: She exhibits edema (1+ pitting LE edema,bilateral). She exhibits no tenderness.  Lymphadenopathy:    She has no cervical adenopathy.  Neurological: She is alert and oriented to  person, place, and time. Coordination normal.  No focal deficit appreciated. Stable/slow gait assisted with a walker.   Skin: Skin is warm. No rash noted. No erythema.  alopecia  Psychiatric: Her mood appears anxious. Her affect is blunt.  Appropriately groomed, good eye contact.     ASSESSMENT AND PLAN:   Brittany. Aniaya was seen today for establish care.  Diagnoses and all orders for this visit:  Lab Results  Component Value Date   CREATININE 1.39 (H) 02/15/2017   BUN 28 (H) 02/15/2017   NA 140 02/15/2017   K 4.4 02/15/2017   CL 109 02/15/2017   CO2 24 02/15/2017   Lab Results  Component Value Date   TSH 0.09 (L) 02/15/2017    Seizure ([place])  She has not had seizures since hospital discharge. Some side effects of Keppra discussed. Recommend continuing treatment. Appt with neuro will be arranged.  -     Ambulatory referral to Neurology  Irregular heart rate  She is not sure about prior Hx. We could not obtain an EKG. Due to body habits we could not get her on examination table, leads did not stay in place when we tried to do it on chair. Asymptomatic. Dr. Irven Shelling partner will see her Monday 8/20 at [place]. Dr. Einar Gip is full for the next couple of weeks Instructed about warning signs.  -     TSH  Essential hypertension  Adequately controlled. No changes in current management. DASH-low salt diet recommended. Eye exam recommended annually.  F/U in 4 months, before if needed.  -     Basic metabolic panel  Chronic obstructive pulmonary disease, unspecified COPD type ([place])  After discussion of other treatment options, she and her daughter decide to continue current management. Be sure to take meds as instructed. F/U in 3-4 months.  Gastroesophageal reflux disease without esophagitis  Stable. GERD precautions discussed. No changes in Protonix dose.  Hx of systemic lupus erythematosus (SLE)  ? Discoid lupus. She is not symptomatic at this time. I recommend  holding on referral until I reviewed records from former PCP, she would like appt arranged asap. Referral placed.  -     Ambulatory referral to Rheumatology  Chronic combined systolic and diastolic CHF, NYHA class 2 ([place])  Educated about Dx. Echo in 2007, I do not have Dr Irven Shelling records or recent echo reports. Cardiac cath 06/2015:Nonischemic cardiomyopathy, LVEF 25-30% with global hypokinesis. Continue Metoprolol, Lisinopril, and Lasix.  Schizophrenia, unspecified type ([place])  Also listed on PMHx bipolar disorder and depression. Denies Hx of psychiatric disorders.  During OV she has mild aggressive behaviors toward daughter, so she left for a few min and cam back. She gets upset when inquired about these Dx's.  DM (diabetes mellitus), type 2 with neurological complications ([facility])  [id number] adequate. No changes in current management. Healthy diet with avoidance of added sugar food intake is an important part of treatment and recommended. She is overdue for eye exam. F/U in 3-4 months  Hypothyroidism, unspecified type  No changes in current management, will follow labs done today and will give further recommendations accordingly.  -     TSH   3:04 pm to 4:20 pm > 40 min face to face OV. > 50% was dedicated to discussion of Dx, educated about symptoms and prognosis.Fall precautions, some side effects of medications, and coordination of care. Right before she was leaving, she remembers she also wanted to ask about "growths on vagina." She has had this for a while. I explained that I cannot do a pelvic today, we could not get her on exam table. I recommend another visit to address just this problem, we may need the procedure room for examination.   Emmarae Cowdery G. [name], MD  [facility]. [place] office.

## 2017-02-15 ENCOUNTER — Encounter: Payer: Self-pay | Admitting: Family Medicine

## 2017-02-15 ENCOUNTER — Ambulatory Visit (INDEPENDENT_AMBULATORY_CARE_PROVIDER_SITE_OTHER): Payer: Medicare Other | Admitting: Family Medicine

## 2017-02-15 VITALS — BP 120/70 | HR 82 | Resp 12 | Ht 63.0 in | Wt 274.0 lb

## 2017-02-15 DIAGNOSIS — M797 Fibromyalgia: Secondary | ICD-10-CM | POA: Diagnosis not present

## 2017-02-15 DIAGNOSIS — K219 Gastro-esophageal reflux disease without esophagitis: Secondary | ICD-10-CM

## 2017-02-15 DIAGNOSIS — M329 Systemic lupus erythematosus, unspecified: Secondary | ICD-10-CM

## 2017-02-15 DIAGNOSIS — J449 Chronic obstructive pulmonary disease, unspecified: Secondary | ICD-10-CM

## 2017-02-15 DIAGNOSIS — L930 Discoid lupus erythematosus: Secondary | ICD-10-CM | POA: Diagnosis not present

## 2017-02-15 DIAGNOSIS — I499 Cardiac arrhythmia, unspecified: Secondary | ICD-10-CM

## 2017-02-15 DIAGNOSIS — I509 Heart failure, unspecified: Secondary | ICD-10-CM | POA: Diagnosis not present

## 2017-02-15 DIAGNOSIS — F209 Schizophrenia, unspecified: Secondary | ICD-10-CM | POA: Diagnosis not present

## 2017-02-15 DIAGNOSIS — Z8739 Personal history of other diseases of the musculoskeletal system and connective tissue: Secondary | ICD-10-CM

## 2017-02-15 DIAGNOSIS — R262 Difficulty in walking, not elsewhere classified: Secondary | ICD-10-CM | POA: Diagnosis not present

## 2017-02-15 DIAGNOSIS — R569 Unspecified convulsions: Secondary | ICD-10-CM | POA: Diagnosis not present

## 2017-02-15 DIAGNOSIS — M1990 Unspecified osteoarthritis, unspecified site: Secondary | ICD-10-CM | POA: Diagnosis not present

## 2017-02-15 DIAGNOSIS — I10 Essential (primary) hypertension: Secondary | ICD-10-CM | POA: Diagnosis not present

## 2017-02-15 DIAGNOSIS — N189 Chronic kidney disease, unspecified: Secondary | ICD-10-CM | POA: Diagnosis not present

## 2017-02-15 DIAGNOSIS — I5042 Chronic combined systolic (congestive) and diastolic (congestive) heart failure: Secondary | ICD-10-CM

## 2017-02-15 DIAGNOSIS — E1149 Type 2 diabetes mellitus with other diabetic neurological complication: Secondary | ICD-10-CM | POA: Diagnosis not present

## 2017-02-15 DIAGNOSIS — I422 Other hypertrophic cardiomyopathy: Secondary | ICD-10-CM | POA: Diagnosis not present

## 2017-02-15 DIAGNOSIS — I129 Hypertensive chronic kidney disease with stage 1 through stage 4 chronic kidney disease, or unspecified chronic kidney disease: Secondary | ICD-10-CM | POA: Diagnosis not present

## 2017-02-15 DIAGNOSIS — E1140 Type 2 diabetes mellitus with diabetic neuropathy, unspecified: Secondary | ICD-10-CM | POA: Diagnosis not present

## 2017-02-15 DIAGNOSIS — E039 Hypothyroidism, unspecified: Secondary | ICD-10-CM | POA: Diagnosis not present

## 2017-02-15 DIAGNOSIS — G3184 Mild cognitive impairment, so stated: Secondary | ICD-10-CM | POA: Diagnosis not present

## 2017-02-15 DIAGNOSIS — N179 Acute kidney failure, unspecified: Secondary | ICD-10-CM | POA: Diagnosis not present

## 2017-02-15 NOTE — Patient Instructions (Addendum)
A few things to remember from today's visit:   Seizure ([place]) - Plan: Ambulatory referral to Neurology  Irregular heart rate - Plan: EKG 12-Lead, TSH, Basic metabolic panel  Essential hypertension  Chronic obstructive pulmonary disease, unspecified COPD type ([place])  Gastroesophageal reflux disease without esophagitis  Dr. Irven Shelling partner will see her monday 8/20 at [place]. Dr. Einar Gip is full for the next couple of weeks   Please be sure medication list is accurate. If a new problem present, please set up appointment sooner than planned today.

## 2017-02-16 LAB — BASIC METABOLIC PANEL
BUN: 28 mg/dL — ABNORMAL HIGH (ref 6–23)
CO2: 24 mEq/L (ref 19–32)
Calcium: 9.2 mg/dL (ref 8.4–10.5)
Chloride: 109 mEq/L (ref 96–112)
Creatinine, Ser: 1.39 mg/dL — ABNORMAL HIGH (ref 0.40–1.20)
GFR: 47.24 mL/min — ABNORMAL LOW (ref >60.00)
Glucose, Bld: 149 mg/dL — ABNORMAL HIGH (ref 70–99)
Potassium: 4.4 mEq/L (ref 3.5–5.1)
Sodium: 140 mEq/L (ref 135–145)

## 2017-02-16 LAB — TSH: TSH: 0.09 u[IU]/mL — ABNORMAL LOW (ref 0.35–4.50)

## 2017-02-17 DIAGNOSIS — J449 Chronic obstructive pulmonary disease, unspecified: Secondary | ICD-10-CM | POA: Diagnosis not present

## 2017-02-17 DIAGNOSIS — I509 Heart failure, unspecified: Secondary | ICD-10-CM | POA: Diagnosis not present

## 2017-02-17 DIAGNOSIS — M1990 Unspecified osteoarthritis, unspecified site: Secondary | ICD-10-CM | POA: Diagnosis not present

## 2017-02-17 DIAGNOSIS — N179 Acute kidney failure, unspecified: Secondary | ICD-10-CM | POA: Diagnosis not present

## 2017-02-17 DIAGNOSIS — E1140 Type 2 diabetes mellitus with diabetic neuropathy, unspecified: Secondary | ICD-10-CM | POA: Diagnosis not present

## 2017-02-17 DIAGNOSIS — R262 Difficulty in walking, not elsewhere classified: Secondary | ICD-10-CM | POA: Diagnosis not present

## 2017-02-17 DIAGNOSIS — G3184 Mild cognitive impairment, so stated: Secondary | ICD-10-CM | POA: Diagnosis not present

## 2017-02-17 DIAGNOSIS — N189 Chronic kidney disease, unspecified: Secondary | ICD-10-CM | POA: Diagnosis not present

## 2017-02-17 DIAGNOSIS — I422 Other hypertrophic cardiomyopathy: Secondary | ICD-10-CM | POA: Diagnosis not present

## 2017-02-17 DIAGNOSIS — M797 Fibromyalgia: Secondary | ICD-10-CM | POA: Diagnosis not present

## 2017-02-17 DIAGNOSIS — L930 Discoid lupus erythematosus: Secondary | ICD-10-CM | POA: Diagnosis not present

## 2017-02-17 DIAGNOSIS — R569 Unspecified convulsions: Secondary | ICD-10-CM | POA: Diagnosis not present

## 2017-02-17 DIAGNOSIS — I129 Hypertensive chronic kidney disease with stage 1 through stage 4 chronic kidney disease, or unspecified chronic kidney disease: Secondary | ICD-10-CM | POA: Diagnosis not present

## 2017-02-18 ENCOUNTER — Encounter: Payer: Self-pay | Admitting: Neurology

## 2017-02-18 DIAGNOSIS — L930 Discoid lupus erythematosus: Secondary | ICD-10-CM | POA: Diagnosis not present

## 2017-02-18 DIAGNOSIS — I422 Other hypertrophic cardiomyopathy: Secondary | ICD-10-CM | POA: Diagnosis not present

## 2017-02-18 DIAGNOSIS — I509 Heart failure, unspecified: Secondary | ICD-10-CM | POA: Diagnosis not present

## 2017-02-18 DIAGNOSIS — N189 Chronic kidney disease, unspecified: Secondary | ICD-10-CM | POA: Diagnosis not present

## 2017-02-18 DIAGNOSIS — R262 Difficulty in walking, not elsewhere classified: Secondary | ICD-10-CM | POA: Diagnosis not present

## 2017-02-18 DIAGNOSIS — G3184 Mild cognitive impairment, so stated: Secondary | ICD-10-CM | POA: Diagnosis not present

## 2017-02-18 DIAGNOSIS — N179 Acute kidney failure, unspecified: Secondary | ICD-10-CM | POA: Diagnosis not present

## 2017-02-18 DIAGNOSIS — M797 Fibromyalgia: Secondary | ICD-10-CM | POA: Diagnosis not present

## 2017-02-18 DIAGNOSIS — I129 Hypertensive chronic kidney disease with stage 1 through stage 4 chronic kidney disease, or unspecified chronic kidney disease: Secondary | ICD-10-CM | POA: Diagnosis not present

## 2017-02-18 DIAGNOSIS — J449 Chronic obstructive pulmonary disease, unspecified: Secondary | ICD-10-CM | POA: Diagnosis not present

## 2017-02-18 DIAGNOSIS — M1990 Unspecified osteoarthritis, unspecified site: Secondary | ICD-10-CM | POA: Diagnosis not present

## 2017-02-18 DIAGNOSIS — E1140 Type 2 diabetes mellitus with diabetic neuropathy, unspecified: Secondary | ICD-10-CM | POA: Diagnosis not present

## 2017-02-18 DIAGNOSIS — R569 Unspecified convulsions: Secondary | ICD-10-CM | POA: Diagnosis not present

## 2017-02-21 DIAGNOSIS — I10 Essential (primary) hypertension: Secondary | ICD-10-CM | POA: Diagnosis not present

## 2017-02-21 DIAGNOSIS — I428 Other cardiomyopathies: Secondary | ICD-10-CM | POA: Diagnosis not present

## 2017-02-21 DIAGNOSIS — I2510 Atherosclerotic heart disease of native coronary artery without angina pectoris: Secondary | ICD-10-CM | POA: Diagnosis not present

## 2017-02-21 DIAGNOSIS — I499 Cardiac arrhythmia, unspecified: Secondary | ICD-10-CM | POA: Diagnosis not present

## 2017-02-21 LAB — HM DIABETES EYE EXAM

## 2017-02-22 DIAGNOSIS — L930 Discoid lupus erythematosus: Secondary | ICD-10-CM | POA: Diagnosis not present

## 2017-02-22 DIAGNOSIS — R262 Difficulty in walking, not elsewhere classified: Secondary | ICD-10-CM | POA: Diagnosis not present

## 2017-02-22 DIAGNOSIS — M1990 Unspecified osteoarthritis, unspecified site: Secondary | ICD-10-CM | POA: Diagnosis not present

## 2017-02-22 DIAGNOSIS — G3184 Mild cognitive impairment, so stated: Secondary | ICD-10-CM | POA: Diagnosis not present

## 2017-02-22 DIAGNOSIS — I129 Hypertensive chronic kidney disease with stage 1 through stage 4 chronic kidney disease, or unspecified chronic kidney disease: Secondary | ICD-10-CM | POA: Diagnosis not present

## 2017-02-22 DIAGNOSIS — R569 Unspecified convulsions: Secondary | ICD-10-CM | POA: Diagnosis not present

## 2017-02-22 DIAGNOSIS — I422 Other hypertrophic cardiomyopathy: Secondary | ICD-10-CM | POA: Diagnosis not present

## 2017-02-22 DIAGNOSIS — M797 Fibromyalgia: Secondary | ICD-10-CM | POA: Diagnosis not present

## 2017-02-22 DIAGNOSIS — I509 Heart failure, unspecified: Secondary | ICD-10-CM | POA: Diagnosis not present

## 2017-02-22 DIAGNOSIS — N189 Chronic kidney disease, unspecified: Secondary | ICD-10-CM | POA: Diagnosis not present

## 2017-02-22 DIAGNOSIS — N179 Acute kidney failure, unspecified: Secondary | ICD-10-CM | POA: Diagnosis not present

## 2017-02-22 DIAGNOSIS — J449 Chronic obstructive pulmonary disease, unspecified: Secondary | ICD-10-CM | POA: Diagnosis not present

## 2017-02-22 DIAGNOSIS — E1140 Type 2 diabetes mellitus with diabetic neuropathy, unspecified: Secondary | ICD-10-CM | POA: Diagnosis not present

## 2017-02-23 ENCOUNTER — Other Ambulatory Visit: Payer: Self-pay | Admitting: Nurse Practitioner

## 2017-02-23 DIAGNOSIS — R569 Unspecified convulsions: Secondary | ICD-10-CM | POA: Diagnosis not present

## 2017-02-23 DIAGNOSIS — R262 Difficulty in walking, not elsewhere classified: Secondary | ICD-10-CM | POA: Diagnosis not present

## 2017-02-23 DIAGNOSIS — I509 Heart failure, unspecified: Secondary | ICD-10-CM | POA: Diagnosis not present

## 2017-02-23 DIAGNOSIS — M1990 Unspecified osteoarthritis, unspecified site: Secondary | ICD-10-CM | POA: Diagnosis not present

## 2017-02-23 DIAGNOSIS — G3184 Mild cognitive impairment, so stated: Secondary | ICD-10-CM | POA: Diagnosis not present

## 2017-02-23 DIAGNOSIS — I422 Other hypertrophic cardiomyopathy: Secondary | ICD-10-CM | POA: Diagnosis not present

## 2017-02-23 DIAGNOSIS — I129 Hypertensive chronic kidney disease with stage 1 through stage 4 chronic kidney disease, or unspecified chronic kidney disease: Secondary | ICD-10-CM | POA: Diagnosis not present

## 2017-02-23 DIAGNOSIS — N189 Chronic kidney disease, unspecified: Secondary | ICD-10-CM | POA: Diagnosis not present

## 2017-02-23 DIAGNOSIS — L930 Discoid lupus erythematosus: Secondary | ICD-10-CM | POA: Diagnosis not present

## 2017-02-23 DIAGNOSIS — M797 Fibromyalgia: Secondary | ICD-10-CM | POA: Diagnosis not present

## 2017-02-23 DIAGNOSIS — J449 Chronic obstructive pulmonary disease, unspecified: Secondary | ICD-10-CM | POA: Diagnosis not present

## 2017-02-23 DIAGNOSIS — N179 Acute kidney failure, unspecified: Secondary | ICD-10-CM | POA: Diagnosis not present

## 2017-02-23 DIAGNOSIS — E1140 Type 2 diabetes mellitus with diabetic neuropathy, unspecified: Secondary | ICD-10-CM | POA: Diagnosis not present

## 2017-02-24 DIAGNOSIS — R262 Difficulty in walking, not elsewhere classified: Secondary | ICD-10-CM | POA: Diagnosis not present

## 2017-02-24 DIAGNOSIS — I422 Other hypertrophic cardiomyopathy: Secondary | ICD-10-CM | POA: Diagnosis not present

## 2017-02-24 DIAGNOSIS — G3184 Mild cognitive impairment, so stated: Secondary | ICD-10-CM | POA: Diagnosis not present

## 2017-02-24 DIAGNOSIS — M797 Fibromyalgia: Secondary | ICD-10-CM | POA: Diagnosis not present

## 2017-02-24 DIAGNOSIS — N189 Chronic kidney disease, unspecified: Secondary | ICD-10-CM | POA: Diagnosis not present

## 2017-02-24 DIAGNOSIS — I129 Hypertensive chronic kidney disease with stage 1 through stage 4 chronic kidney disease, or unspecified chronic kidney disease: Secondary | ICD-10-CM | POA: Diagnosis not present

## 2017-02-24 DIAGNOSIS — J449 Chronic obstructive pulmonary disease, unspecified: Secondary | ICD-10-CM | POA: Diagnosis not present

## 2017-02-24 DIAGNOSIS — E1140 Type 2 diabetes mellitus with diabetic neuropathy, unspecified: Secondary | ICD-10-CM | POA: Diagnosis not present

## 2017-02-24 DIAGNOSIS — R569 Unspecified convulsions: Secondary | ICD-10-CM | POA: Diagnosis not present

## 2017-02-24 DIAGNOSIS — N179 Acute kidney failure, unspecified: Secondary | ICD-10-CM | POA: Diagnosis not present

## 2017-02-24 DIAGNOSIS — I509 Heart failure, unspecified: Secondary | ICD-10-CM | POA: Diagnosis not present

## 2017-02-24 DIAGNOSIS — L930 Discoid lupus erythematosus: Secondary | ICD-10-CM | POA: Diagnosis not present

## 2017-02-24 DIAGNOSIS — M1990 Unspecified osteoarthritis, unspecified site: Secondary | ICD-10-CM | POA: Diagnosis not present

## 2017-02-28 ENCOUNTER — Telehealth: Payer: Self-pay | Admitting: Family Medicine

## 2017-02-28 ENCOUNTER — Encounter: Payer: Self-pay | Admitting: Family Medicine

## 2017-02-28 MED ORDER — LEVETIRACETAM 500 MG PO TABS: 500.0000 mg | ORAL_TABLET | Freq: Two times a day (BID) | ORAL | 2 refills | Status: DC

## 2017-02-28 NOTE — Telephone Encounter (Signed)
Patient called to say she has one more day of her seizure medication (does not know the name of it) She called the pharmacy but the pharmacy states that they have been trying to get a hold of [facility] but have not been able to get though. She states she needs the medications as soon as possible. Pleas call her pharmacy.

## 2017-02-28 NOTE — Telephone Encounter (Signed)
Rx sent in

## 2017-03-01 DIAGNOSIS — R262 Difficulty in walking, not elsewhere classified: Secondary | ICD-10-CM | POA: Diagnosis not present

## 2017-03-01 DIAGNOSIS — I509 Heart failure, unspecified: Secondary | ICD-10-CM | POA: Diagnosis not present

## 2017-03-01 DIAGNOSIS — E1140 Type 2 diabetes mellitus with diabetic neuropathy, unspecified: Secondary | ICD-10-CM | POA: Diagnosis not present

## 2017-03-01 DIAGNOSIS — J449 Chronic obstructive pulmonary disease, unspecified: Secondary | ICD-10-CM | POA: Diagnosis not present

## 2017-03-01 DIAGNOSIS — M1990 Unspecified osteoarthritis, unspecified site: Secondary | ICD-10-CM | POA: Diagnosis not present

## 2017-03-01 DIAGNOSIS — N189 Chronic kidney disease, unspecified: Secondary | ICD-10-CM | POA: Diagnosis not present

## 2017-03-01 DIAGNOSIS — G3184 Mild cognitive impairment, so stated: Secondary | ICD-10-CM | POA: Diagnosis not present

## 2017-03-01 DIAGNOSIS — I422 Other hypertrophic cardiomyopathy: Secondary | ICD-10-CM | POA: Diagnosis not present

## 2017-03-01 DIAGNOSIS — L930 Discoid lupus erythematosus: Secondary | ICD-10-CM | POA: Diagnosis not present

## 2017-03-01 DIAGNOSIS — N179 Acute kidney failure, unspecified: Secondary | ICD-10-CM | POA: Diagnosis not present

## 2017-03-01 DIAGNOSIS — R569 Unspecified convulsions: Secondary | ICD-10-CM | POA: Diagnosis not present

## 2017-03-01 DIAGNOSIS — M797 Fibromyalgia: Secondary | ICD-10-CM | POA: Diagnosis not present

## 2017-03-01 DIAGNOSIS — I129 Hypertensive chronic kidney disease with stage 1 through stage 4 chronic kidney disease, or unspecified chronic kidney disease: Secondary | ICD-10-CM | POA: Diagnosis not present

## 2017-03-02 ENCOUNTER — Telehealth: Payer: Self-pay | Admitting: Family Medicine

## 2017-03-02 NOTE — Telephone Encounter (Signed)
Pt was taking furosemide 20 mg every other day. Pt saw dr Einar Gip assistant and he put her on furosemide 40 mg every day. Pt has chronic kidney disease and does not feeling comfort taking 40 mg every day. Pt is requesting dr [name] advise

## 2017-03-03 DIAGNOSIS — I10 Essential (primary) hypertension: Secondary | ICD-10-CM | POA: Diagnosis not present

## 2017-03-03 DIAGNOSIS — M797 Fibromyalgia: Secondary | ICD-10-CM | POA: Diagnosis not present

## 2017-03-03 DIAGNOSIS — N189 Chronic kidney disease, unspecified: Secondary | ICD-10-CM | POA: Diagnosis not present

## 2017-03-03 DIAGNOSIS — R569 Unspecified convulsions: Secondary | ICD-10-CM | POA: Diagnosis not present

## 2017-03-03 DIAGNOSIS — R262 Difficulty in walking, not elsewhere classified: Secondary | ICD-10-CM | POA: Diagnosis not present

## 2017-03-03 DIAGNOSIS — N179 Acute kidney failure, unspecified: Secondary | ICD-10-CM | POA: Diagnosis not present

## 2017-03-03 DIAGNOSIS — L930 Discoid lupus erythematosus: Secondary | ICD-10-CM | POA: Diagnosis not present

## 2017-03-03 DIAGNOSIS — G3184 Mild cognitive impairment, so stated: Secondary | ICD-10-CM | POA: Diagnosis not present

## 2017-03-03 DIAGNOSIS — I509 Heart failure, unspecified: Secondary | ICD-10-CM | POA: Diagnosis not present

## 2017-03-03 DIAGNOSIS — J449 Chronic obstructive pulmonary disease, unspecified: Secondary | ICD-10-CM | POA: Diagnosis not present

## 2017-03-03 DIAGNOSIS — I422 Other hypertrophic cardiomyopathy: Secondary | ICD-10-CM | POA: Diagnosis not present

## 2017-03-03 DIAGNOSIS — I129 Hypertensive chronic kidney disease with stage 1 through stage 4 chronic kidney disease, or unspecified chronic kidney disease: Secondary | ICD-10-CM | POA: Diagnosis not present

## 2017-03-03 DIAGNOSIS — M1990 Unspecified osteoarthritis, unspecified site: Secondary | ICD-10-CM | POA: Diagnosis not present

## 2017-03-03 DIAGNOSIS — E1140 Type 2 diabetes mellitus with diabetic neuropathy, unspecified: Secondary | ICD-10-CM | POA: Diagnosis not present

## 2017-03-04 NOTE — Telephone Encounter (Signed)
She needs to follow recommendations given by her cardiologists. She can discuss her concerns about medication during cardiology f/u and after repeating BMP.   Thanks, Brittany Roberts

## 2017-03-08 DIAGNOSIS — M1990 Unspecified osteoarthritis, unspecified site: Secondary | ICD-10-CM | POA: Diagnosis not present

## 2017-03-08 DIAGNOSIS — N189 Chronic kidney disease, unspecified: Secondary | ICD-10-CM | POA: Diagnosis not present

## 2017-03-08 DIAGNOSIS — G3184 Mild cognitive impairment, so stated: Secondary | ICD-10-CM | POA: Diagnosis not present

## 2017-03-08 DIAGNOSIS — M797 Fibromyalgia: Secondary | ICD-10-CM | POA: Diagnosis not present

## 2017-03-08 DIAGNOSIS — I129 Hypertensive chronic kidney disease with stage 1 through stage 4 chronic kidney disease, or unspecified chronic kidney disease: Secondary | ICD-10-CM | POA: Diagnosis not present

## 2017-03-08 DIAGNOSIS — J449 Chronic obstructive pulmonary disease, unspecified: Secondary | ICD-10-CM | POA: Diagnosis not present

## 2017-03-08 DIAGNOSIS — I422 Other hypertrophic cardiomyopathy: Secondary | ICD-10-CM | POA: Diagnosis not present

## 2017-03-08 DIAGNOSIS — R262 Difficulty in walking, not elsewhere classified: Secondary | ICD-10-CM | POA: Diagnosis not present

## 2017-03-08 DIAGNOSIS — N179 Acute kidney failure, unspecified: Secondary | ICD-10-CM | POA: Diagnosis not present

## 2017-03-08 DIAGNOSIS — E1140 Type 2 diabetes mellitus with diabetic neuropathy, unspecified: Secondary | ICD-10-CM | POA: Diagnosis not present

## 2017-03-08 DIAGNOSIS — R569 Unspecified convulsions: Secondary | ICD-10-CM | POA: Diagnosis not present

## 2017-03-08 DIAGNOSIS — L930 Discoid lupus erythematosus: Secondary | ICD-10-CM | POA: Diagnosis not present

## 2017-03-08 DIAGNOSIS — I509 Heart failure, unspecified: Secondary | ICD-10-CM | POA: Diagnosis not present

## 2017-03-08 NOTE — Telephone Encounter (Signed)
I called and spoke with patient. I advised her to follow the recommendations given by her cardiologists as Dr. [name] had mentioned below. Patient will bring a form to be filled out for camp due to her next appointment not being until October.

## 2017-03-09 DIAGNOSIS — I2510 Atherosclerotic heart disease of native coronary artery without angina pectoris: Secondary | ICD-10-CM | POA: Diagnosis not present

## 2017-03-09 DIAGNOSIS — I428 Other cardiomyopathies: Secondary | ICD-10-CM | POA: Diagnosis not present

## 2017-03-09 DIAGNOSIS — Z9861 Coronary angioplasty status: Secondary | ICD-10-CM | POA: Diagnosis not present

## 2017-03-09 DIAGNOSIS — I10 Essential (primary) hypertension: Secondary | ICD-10-CM | POA: Diagnosis not present

## 2017-03-10 DIAGNOSIS — G3184 Mild cognitive impairment, so stated: Secondary | ICD-10-CM | POA: Diagnosis not present

## 2017-03-10 DIAGNOSIS — I509 Heart failure, unspecified: Secondary | ICD-10-CM | POA: Diagnosis not present

## 2017-03-10 DIAGNOSIS — R569 Unspecified convulsions: Secondary | ICD-10-CM | POA: Diagnosis not present

## 2017-03-10 DIAGNOSIS — I129 Hypertensive chronic kidney disease with stage 1 through stage 4 chronic kidney disease, or unspecified chronic kidney disease: Secondary | ICD-10-CM | POA: Diagnosis not present

## 2017-03-10 DIAGNOSIS — M797 Fibromyalgia: Secondary | ICD-10-CM | POA: Diagnosis not present

## 2017-03-10 DIAGNOSIS — I422 Other hypertrophic cardiomyopathy: Secondary | ICD-10-CM | POA: Diagnosis not present

## 2017-03-10 DIAGNOSIS — J449 Chronic obstructive pulmonary disease, unspecified: Secondary | ICD-10-CM | POA: Diagnosis not present

## 2017-03-10 DIAGNOSIS — M1990 Unspecified osteoarthritis, unspecified site: Secondary | ICD-10-CM | POA: Diagnosis not present

## 2017-03-10 DIAGNOSIS — N189 Chronic kidney disease, unspecified: Secondary | ICD-10-CM | POA: Diagnosis not present

## 2017-03-10 DIAGNOSIS — N179 Acute kidney failure, unspecified: Secondary | ICD-10-CM | POA: Diagnosis not present

## 2017-03-10 DIAGNOSIS — L930 Discoid lupus erythematosus: Secondary | ICD-10-CM | POA: Diagnosis not present

## 2017-03-10 DIAGNOSIS — E1140 Type 2 diabetes mellitus with diabetic neuropathy, unspecified: Secondary | ICD-10-CM | POA: Diagnosis not present

## 2017-03-10 DIAGNOSIS — R262 Difficulty in walking, not elsewhere classified: Secondary | ICD-10-CM | POA: Diagnosis not present

## 2017-03-10 NOTE — Progress Notes (Signed)
HPI:   Brittany Roberts is a 76 y.o. female, who is here today with one of her daughters to follow on recent TSH results.   She was seen on 02/15/17. Since her last OV she has followed with her cardiologists. Furosemide dose was increased from 20 mg to 40 mg.She is concerned about her renal function getting worse with Furosemide. Lisinopril 10 mg was discontinued since her last OV. ACEI's listed on her med lists, elevation of Cr.  Daughter is following her wt daily. Stable exertional dyspnea. No chest pain, orthopnea,or PND.  She has a f/u appt scheduled with Dr Nolon Bussing, cardiologists. Also has scheduled Echo and renal US.   Hypothyroidism: Levothyroxine 25 mcg was discontinued after last TSH. Daughter reports Hx of thyroid nodules, she has had work-up done in the past.  She has not noted dysphagia, palpitations, abdominal pain, changes in bowel habits, tremor, cold/heat intolerance, or abnormal weight loss.  Lab Results  Component Value Date   TSH 0.09 (L) 02/15/2017   Thyroid US 07/2014: Interval enlargement of the left thyroid lobe with multiple solid nodules. Mild enlargement of the dominant left thyroid nodule. There are 2 nodules in the left thyroid lobe with dystrophic calcifications. It appears that the dominant lesion was biopsied on 03/01/2005. At least 3 other nodules meet the criteria for ultrasound-guided biopsy.     Last OV she was also concerned about skin lesion around genital area, so I wanted to follow on this. She states that lesions have resolved. She has this problem intermittently for years. Her daughter states that she never has complained about this before, she doe snot think it is concerning because otherwise she would have say something to her. She denies vaginal bleeding,discharge,or pelvic pain. Lesions are on external genitals and sometimes on groin and not tender. Usually resolved spontaneously.  She is also c/o 2-3 months of changes in her  taste, "everything" tastes like sand. She denies lesions in her mouth, dysphagia, or odynophagia. + Bad breath, this is a chronic problems. Her daughter thinks it is related to GERD.  + Nasal congestion and post nasal drainage. + Hx of allergic rhinitis.  Also c/o constipation, she denies changes in bowel habits. Periumbilical abdominal discomfort, alleviated by defecation. Last bowel movement today but she still feels like she needs to go. She denies hematochezia or dyschezia.   Review of Systems  Constitutional: Positive for fatigue (no moe than usual). Negative for activity change, appetite change and fever.  HENT: Positive for congestion, postnasal drip and rhinorrhea. Negative for mouth sores, nosebleeds, sinus pain, sore throat, trouble swallowing and voice change.   Eyes: Negative for redness and visual disturbance.  Respiratory: Positive for shortness of breath (stable.). Negative for cough and wheezing.   Cardiovascular: Negative for chest pain, palpitations and leg swelling.  Gastrointestinal: Positive for constipation. Negative for abdominal pain, nausea and vomiting.       Negative for changes in bowel habits.  Endocrine: Negative for cold intolerance and heat intolerance.  Genitourinary: Negative for decreased urine volume, dysuria and hematuria.  Musculoskeletal: Positive for gait problem. Negative for myalgias.  Skin: Negative for rash and wound.  Allergic/Immunologic: Positive for environmental allergies.  Neurological: Negative for syncope, weakness and headaches.  Psychiatric/Behavioral: Negative for confusion. The patient is nervous/anxious.       Current Outpatient Prescriptions on File Prior to Visit  Medication Sig Dispense Refill  . ACCU-CHEK AVIVA PLUS test strip TEST three times a day 300 each 3  .  acetaminophen (TYLENOL) 325 MG tablet Take 650 mg by mouth every 6 (six) hours as needed for mild pain or moderate pain.    Marland Kitchen albuterol (PROAIR HFA) 108 (90  Base) MCG/ACT inhaler inhale 2 puffs by mouth every 4 hours if needed USE ONLY IF YOU ARE WHEEZING 3 Inhaler 3  . amLODipine (NORVASC) 10 MG tablet Take 1 tablet (10 mg total) by mouth daily. 90 tablet 3  . Ascorbic Acid (VITAMIN C PO) Take 1 tablet by mouth daily.    Marland Kitchen aspirin EC 81 MG tablet Take 162 mg by mouth daily.     Marland Kitchen atorvastatin (LIPITOR) 20 MG tablet Take 1 tablet (20 mg total) by mouth daily. 90 tablet 3  . B Complex Vitamins (VITAMIN B COMPLEX) TABS Take 1 tablet by mouth daily.    . Cholecalciferol (VITAMIN D3) 5000 UNITS CAPS Take 5,000 Units by mouth daily.    . Flaxseed, Linseed, (FLAXSEED OIL PO) Take 1 capsule by mouth daily.    . fluticasone (FLOVENT HFA) 220 MCG/ACT inhaler inhale 1 puff by mouth twice a day 36 g 3  . furosemide (LASIX) 20 MG tablet take 1 tablet by mouth once daily 90 tablet 1  . gabapentin (NEURONTIN) 300 MG capsule Take 1 capsule (300 mg total) by mouth 2 (two) times daily. 180 capsule 3  . guaiFENesin (MUCINEX) 600 MG 12 hr tablet Take 1 tablet (600 mg total) by mouth 2 (two) times daily. 20 tablet 0  . Insulin NPH, Human,, Isophane, (HUMULIN N KWIKPEN) 100 UNIT/ML Kiwkpen Inject 30 Units into the skin every morning. 15 mL 3  . Insulin Pen Needle (PEN NEEDLES 31GX5/16") 31G X 8 MM MISC Check Blood sugar three times per day. Dx insulin using E11.8 100 each 3  . levETIRAcetam (KEPPRA) 500 MG tablet Take 1 tablet (500 mg total) by mouth 2 (two) times daily. 60 tablet 2  . metFORMIN (GLUCOPHAGE) 500 MG tablet Take 1 tablet (500 mg total) by mouth 2 (two) times daily with a meal. 180 tablet 3  . metoprolol succinate (TOPROL-XL) 50 MG 24 hr tablet TAKE ONE TABLET BY MOUTH ONCE DAILY WITH OR IMMEDIATELY FOLLOWING A MEAL 90 tablet 3  . Multiple Vitamin (MULTIVITAMIN WITH MINERALS) TABS tablet Take 1 tablet by mouth daily.    . nitroGLYCERIN (NITROSTAT) 0.4 MG SL tablet Place 1 tablet (0.4 mg total) under the tongue every 5 (five) minutes as needed for chest pain.  20 tablet 12  . pantoprazole (PROTONIX) 40 MG tablet Take 1 tablet (40 mg total) by mouth daily. 90 tablet 3  . psyllium (METAMUCIL SMOOTH TEXTURE) 28 % packet Take 1 packet by mouth 2 (two) times daily. 30 packet 12   No current facility-administered medications on file prior to visit.      Past Medical History:  Diagnosis Date  . Acute delirium 01/14/2017  . Anxiety   . Arthritis    "all over"  . Asthma   . Bipolar disorder ([place])   . Complication of anesthesia    "w/right foot OR they gave me too much and they couldn't get me woke"  . Congestive heart failure, unspecified   . Coronary artery calcification seen on CAT scan 01/14/2017  . Coronary artery disease    "I've got 1 stent" (06/30/2015)  . Depression   . Discoid lupus   . Fibromyalgia    "I've been told I have this; can't take Lyrica cause I'm allergic to it" (06/30/2015)  . GERD (gastroesophageal reflux disease)   .  History of gout   . History of hiatal hernia    "real bad" (06/30/2015)  . Hypertension   . Hypothyroidism   . On home oxygen therapy    "2L q hs" (06/30/2015)  . OSA (obstructive sleep apnea)    "just use my oxygen; no mask" (06/30/2015)  . Other and unspecified hyperlipidemia   . Penetrating foot wound    left nonhealing foot wound on the dorsal surface  . Peripheral neuropathy   . Pneumonia "many of times"  . Refusal of blood transfusions as patient is Jehovah's Witness   . Renal failure, unspecified    "kidneys not working 100%" (06/30/2015)  . Sickle cell trait ([place])   . Systemic lupus ([place])   . Thoracic aortic atherosclerosis ([place]) 01/14/2017  . Type II diabetes mellitus ([place])   . Unspecified vitamin D deficiency    Allergies  Allergen Reactions  . Ace Inhibitors Other (See Comments)    Coincided with sig bump in creat. Retried and creat bumped again.  Beulah Gandy [Isosorb Dinitrate-Hydralazine] Other (See Comments)    Sleep all the time.  Ebbie Ridge [Buspirone] Other (See Comments)    pain    . Pregabalin Swelling  . Ropinirole Hydrochloride Swelling  . Amantadines Rash  . Penicillins Swelling and Rash    Has patient had a PCN reaction causing immediate rash, facial/tongue/throat swelling, SOB or lightheadedness with hypotension: Yes Has patient had a PCN reaction causing severe rash involving mucus membranes or skin necrosis: Yes Has patient had a PCN reaction that required hospitalization: Yes Has patient had a PCN reaction occurring within the last 10 years: No If all of the above answers are "NO", then may proceed with Cephalosporin use.     Social History   Social History  . Marital status: Single    Spouse name: N/A  . Number of children: 4  . Years of education: 19 th   Occupational History  . Retired- Engineer, production Retired   Social History Main Topics  . Smoking status: Former Smoker    Packs/day: 3.00    Years: 40.00    Types: Cigarettes    Start date: 07/05/1960    Quit date: 07/05/1998  . Smokeless tobacco: Never Used  . Alcohol use No     Comment: 06/30/2015 "used to drink a little alcohol on the weekends; not q weekend; nothing in years"  . Drug use: No  . Sexual activity: No   Other Topics Concern  . None   Social History Narrative   Health Care POA:    Emergency Contact: daughter, Lorenso Courier [phone] or [name] [phone]   End of Life Plan: Does not want to be ventilated or feeding tubes.    Who lives with you: Lives alone   Any pets: none   Diet: Patient reports enjoying and eating junk food.  Does not regulate types of food and currently her dentures are broken so it is hard to eat some foods.   Exercise: Patient does not have an exercise plan.   Seatbelts: Patient reports wearing seatbelt when in vehicle.   Sun Exposure/Protection: Patient reports not going outside very often.   Hobbies: Patient enjoys reading the bible and watching game shows.    Right-handed.   1 cup caffeine per day.   Former smoker-stopped 2000    Alcohol none       Vitals:   03/11/17 0959  BP: 120/78  Pulse: 80  Resp: 12  SpO2: 90%   Body  mass index is 48.27 kg/m.   Physical Exam  Nursing note and vitals reviewed. Constitutional: She is oriented to person, place, and time. She appears well-developed. No distress.  HENT:  Head: Atraumatic.  Nose: No sinus tenderness. Right sinus exhibits no maxillary sinus tenderness and no frontal sinus tenderness. Left sinus exhibits no maxillary sinus tenderness and no frontal sinus tenderness.  Mouth/Throat: Oropharynx is clear and moist and mucous membranes are normal.  Eyes: Conjunctivae are normal.  Neck: No tracheal deviation present. Thyromegaly (mild) present.  Cardiovascular: Normal rate.  An irregular rhythm present.  No murmur heard. Pulses:      Dorsalis pedis pulses are 2+ on the right side, and 2+ on the left side.  Respiratory: Effort normal and breath sounds normal. No respiratory distress.  GI: Soft. There is no hepatomegaly. There is tenderness.  Musculoskeletal: She exhibits edema (Trace pitting LE edema, bilateral.). She exhibits no tenderness.  Lymphadenopathy:    She has no cervical adenopathy.  Neurological: She is alert and oriented to person, place, and time. She has normal strength. Coordination normal.  Stable gait assisted with a walker.  Skin: Skin is warm. No erythema.  Psychiatric: Her mood appears anxious.  Fairly groomed, good eye contact.     ASSESSMENT AND PLAN:   Ms. Aiesha was seen today for follow-up.  Diagnoses and all orders for this visit:  Hypothyroidism, unspecified type  Last TSH abnormal. TSH to be repeated in 4-6 weeks. I see a thyroid nuclear study ordered but I do not see report. Further recommendations will be given according to lab results.  -     TSH; Future  Constipation, unspecified constipation type  Adequate fiber intake and adequate hydration, Benefiber OTC may help. Colonoscopy in 2015. Some of her meds could  also aggravate problem. Instructed about warning signs.  Chronic kidney disease, stage III (moderate)  Adequate HTN and DM controlled. BMP with next labs. Low salt diet and avoid NSAID's. F/U in 3 months.  Altered taste  Possible causes discussed. I do not appreciate oral mucosa abnormality or lesions that could explain problem. ENT appt could be arranged, she prefers to hold on referral for now.  Allergic rhinitis, unspecified seasonality, unspecified trigger  This problems could contributed to taste changes. Nasal irrigations with saline may help.   In regard to skin lesions, she is reporting resolution.Daughtrt will monitor and tells me that she will take a picture if lesion comes back.    Kumar Falwell G. [name], MD  [facility]. [place] office.

## 2017-03-11 ENCOUNTER — Ambulatory Visit (INDEPENDENT_AMBULATORY_CARE_PROVIDER_SITE_OTHER): Payer: Medicare Other | Admitting: Family Medicine

## 2017-03-11 ENCOUNTER — Encounter: Payer: Self-pay | Admitting: Family Medicine

## 2017-03-11 VITALS — BP 120/78 | HR 80 | Resp 12 | Ht 63.0 in | Wt 272.5 lb

## 2017-03-11 DIAGNOSIS — E039 Hypothyroidism, unspecified: Secondary | ICD-10-CM

## 2017-03-11 DIAGNOSIS — N183 Chronic kidney disease, stage 3 unspecified: Secondary | ICD-10-CM

## 2017-03-11 DIAGNOSIS — R432 Parageusia: Secondary | ICD-10-CM

## 2017-03-11 DIAGNOSIS — K5900 Constipation, unspecified: Secondary | ICD-10-CM | POA: Diagnosis not present

## 2017-03-11 DIAGNOSIS — J309 Allergic rhinitis, unspecified: Secondary | ICD-10-CM

## 2017-03-11 NOTE — Patient Instructions (Signed)
A few things to remember from today's visit:   Hypothyroidism, unspecified type  Constipation, unspecified constipation type  Chronic kidney disease, stage III (moderate)  To preserve kidney function and/or slow progression of disease:  Low salt diet and adequate hydration. Avoiding medicines known as "nonsteroidal anti-inflammatory drugs," or NSAIDs. These medicines include ibuprofen (sample brand names: Advil, Motrin) and naproxen (sample brand name: Aleve).  Adequate blood pressure control. Low phosphorus diet.  Thyroid test in 4-6 weeks. Keep appt with heart doctor. No changes in meds.  Benefiber may help with constipation.    Please be sure medication list is accurate. If a new problem present, please set up appointment sooner than planned today.

## 2017-03-14 DIAGNOSIS — I422 Other hypertrophic cardiomyopathy: Secondary | ICD-10-CM | POA: Diagnosis not present

## 2017-03-14 DIAGNOSIS — J45909 Unspecified asthma, uncomplicated: Secondary | ICD-10-CM | POA: Diagnosis not present

## 2017-03-14 DIAGNOSIS — L930 Discoid lupus erythematosus: Secondary | ICD-10-CM | POA: Diagnosis not present

## 2017-03-14 DIAGNOSIS — R262 Difficulty in walking, not elsewhere classified: Secondary | ICD-10-CM | POA: Diagnosis not present

## 2017-03-14 DIAGNOSIS — R569 Unspecified convulsions: Secondary | ICD-10-CM | POA: Diagnosis not present

## 2017-03-14 DIAGNOSIS — I509 Heart failure, unspecified: Secondary | ICD-10-CM | POA: Diagnosis not present

## 2017-03-14 DIAGNOSIS — J449 Chronic obstructive pulmonary disease, unspecified: Secondary | ICD-10-CM | POA: Diagnosis not present

## 2017-03-14 DIAGNOSIS — N179 Acute kidney failure, unspecified: Secondary | ICD-10-CM | POA: Diagnosis not present

## 2017-03-14 DIAGNOSIS — G3184 Mild cognitive impairment, so stated: Secondary | ICD-10-CM | POA: Diagnosis not present

## 2017-03-14 DIAGNOSIS — M1990 Unspecified osteoarthritis, unspecified site: Secondary | ICD-10-CM | POA: Diagnosis not present

## 2017-03-14 DIAGNOSIS — N189 Chronic kidney disease, unspecified: Secondary | ICD-10-CM | POA: Diagnosis not present

## 2017-03-14 DIAGNOSIS — I428 Other cardiomyopathies: Secondary | ICD-10-CM | POA: Diagnosis not present

## 2017-03-14 DIAGNOSIS — I129 Hypertensive chronic kidney disease with stage 1 through stage 4 chronic kidney disease, or unspecified chronic kidney disease: Secondary | ICD-10-CM | POA: Diagnosis not present

## 2017-03-14 DIAGNOSIS — M797 Fibromyalgia: Secondary | ICD-10-CM | POA: Diagnosis not present

## 2017-03-14 DIAGNOSIS — E1140 Type 2 diabetes mellitus with diabetic neuropathy, unspecified: Secondary | ICD-10-CM | POA: Diagnosis not present

## 2017-03-15 DIAGNOSIS — E1140 Type 2 diabetes mellitus with diabetic neuropathy, unspecified: Secondary | ICD-10-CM | POA: Diagnosis not present

## 2017-03-15 DIAGNOSIS — I509 Heart failure, unspecified: Secondary | ICD-10-CM | POA: Diagnosis not present

## 2017-03-15 DIAGNOSIS — I422 Other hypertrophic cardiomyopathy: Secondary | ICD-10-CM | POA: Diagnosis not present

## 2017-03-15 DIAGNOSIS — L930 Discoid lupus erythematosus: Secondary | ICD-10-CM | POA: Diagnosis not present

## 2017-03-15 DIAGNOSIS — G3184 Mild cognitive impairment, so stated: Secondary | ICD-10-CM | POA: Diagnosis not present

## 2017-03-15 DIAGNOSIS — R569 Unspecified convulsions: Secondary | ICD-10-CM | POA: Diagnosis not present

## 2017-03-15 DIAGNOSIS — N179 Acute kidney failure, unspecified: Secondary | ICD-10-CM | POA: Diagnosis not present

## 2017-03-15 DIAGNOSIS — M1990 Unspecified osteoarthritis, unspecified site: Secondary | ICD-10-CM | POA: Diagnosis not present

## 2017-03-15 DIAGNOSIS — N189 Chronic kidney disease, unspecified: Secondary | ICD-10-CM | POA: Diagnosis not present

## 2017-03-15 DIAGNOSIS — J449 Chronic obstructive pulmonary disease, unspecified: Secondary | ICD-10-CM | POA: Diagnosis not present

## 2017-03-15 DIAGNOSIS — R262 Difficulty in walking, not elsewhere classified: Secondary | ICD-10-CM | POA: Diagnosis not present

## 2017-03-15 DIAGNOSIS — M797 Fibromyalgia: Secondary | ICD-10-CM | POA: Diagnosis not present

## 2017-03-15 DIAGNOSIS — I129 Hypertensive chronic kidney disease with stage 1 through stage 4 chronic kidney disease, or unspecified chronic kidney disease: Secondary | ICD-10-CM | POA: Diagnosis not present

## 2017-03-16 DIAGNOSIS — M1990 Unspecified osteoarthritis, unspecified site: Secondary | ICD-10-CM | POA: Diagnosis not present

## 2017-03-16 DIAGNOSIS — J449 Chronic obstructive pulmonary disease, unspecified: Secondary | ICD-10-CM | POA: Diagnosis not present

## 2017-03-16 DIAGNOSIS — E1140 Type 2 diabetes mellitus with diabetic neuropathy, unspecified: Secondary | ICD-10-CM | POA: Diagnosis not present

## 2017-03-16 DIAGNOSIS — I129 Hypertensive chronic kidney disease with stage 1 through stage 4 chronic kidney disease, or unspecified chronic kidney disease: Secondary | ICD-10-CM | POA: Diagnosis not present

## 2017-03-16 DIAGNOSIS — I422 Other hypertrophic cardiomyopathy: Secondary | ICD-10-CM | POA: Diagnosis not present

## 2017-03-16 DIAGNOSIS — M797 Fibromyalgia: Secondary | ICD-10-CM | POA: Diagnosis not present

## 2017-03-16 DIAGNOSIS — R262 Difficulty in walking, not elsewhere classified: Secondary | ICD-10-CM | POA: Diagnosis not present

## 2017-03-16 DIAGNOSIS — L930 Discoid lupus erythematosus: Secondary | ICD-10-CM | POA: Diagnosis not present

## 2017-03-16 DIAGNOSIS — N179 Acute kidney failure, unspecified: Secondary | ICD-10-CM | POA: Diagnosis not present

## 2017-03-16 DIAGNOSIS — N189 Chronic kidney disease, unspecified: Secondary | ICD-10-CM | POA: Diagnosis not present

## 2017-03-16 DIAGNOSIS — I509 Heart failure, unspecified: Secondary | ICD-10-CM | POA: Diagnosis not present

## 2017-03-16 DIAGNOSIS — G3184 Mild cognitive impairment, so stated: Secondary | ICD-10-CM | POA: Diagnosis not present

## 2017-03-16 DIAGNOSIS — R569 Unspecified convulsions: Secondary | ICD-10-CM | POA: Diagnosis not present

## 2017-03-22 DIAGNOSIS — I129 Hypertensive chronic kidney disease with stage 1 through stage 4 chronic kidney disease, or unspecified chronic kidney disease: Secondary | ICD-10-CM | POA: Diagnosis not present

## 2017-03-22 DIAGNOSIS — I422 Other hypertrophic cardiomyopathy: Secondary | ICD-10-CM | POA: Diagnosis not present

## 2017-03-22 DIAGNOSIS — N179 Acute kidney failure, unspecified: Secondary | ICD-10-CM | POA: Diagnosis not present

## 2017-03-22 DIAGNOSIS — L930 Discoid lupus erythematosus: Secondary | ICD-10-CM | POA: Diagnosis not present

## 2017-03-22 DIAGNOSIS — I509 Heart failure, unspecified: Secondary | ICD-10-CM | POA: Diagnosis not present

## 2017-03-22 DIAGNOSIS — E1140 Type 2 diabetes mellitus with diabetic neuropathy, unspecified: Secondary | ICD-10-CM | POA: Diagnosis not present

## 2017-03-22 DIAGNOSIS — R569 Unspecified convulsions: Secondary | ICD-10-CM | POA: Diagnosis not present

## 2017-03-22 DIAGNOSIS — N189 Chronic kidney disease, unspecified: Secondary | ICD-10-CM | POA: Diagnosis not present

## 2017-03-22 DIAGNOSIS — M1990 Unspecified osteoarthritis, unspecified site: Secondary | ICD-10-CM | POA: Diagnosis not present

## 2017-03-22 DIAGNOSIS — G3184 Mild cognitive impairment, so stated: Secondary | ICD-10-CM | POA: Diagnosis not present

## 2017-03-22 DIAGNOSIS — J449 Chronic obstructive pulmonary disease, unspecified: Secondary | ICD-10-CM | POA: Diagnosis not present

## 2017-03-22 DIAGNOSIS — M797 Fibromyalgia: Secondary | ICD-10-CM | POA: Diagnosis not present

## 2017-03-22 DIAGNOSIS — R262 Difficulty in walking, not elsewhere classified: Secondary | ICD-10-CM | POA: Diagnosis not present

## 2017-03-24 ENCOUNTER — Telehealth: Payer: Self-pay | Admitting: Family Medicine

## 2017-03-24 DIAGNOSIS — R569 Unspecified convulsions: Secondary | ICD-10-CM | POA: Diagnosis not present

## 2017-03-24 DIAGNOSIS — L930 Discoid lupus erythematosus: Secondary | ICD-10-CM | POA: Diagnosis not present

## 2017-03-24 DIAGNOSIS — J449 Chronic obstructive pulmonary disease, unspecified: Secondary | ICD-10-CM | POA: Diagnosis not present

## 2017-03-24 DIAGNOSIS — I129 Hypertensive chronic kidney disease with stage 1 through stage 4 chronic kidney disease, or unspecified chronic kidney disease: Secondary | ICD-10-CM | POA: Diagnosis not present

## 2017-03-24 DIAGNOSIS — N189 Chronic kidney disease, unspecified: Secondary | ICD-10-CM | POA: Diagnosis not present

## 2017-03-24 DIAGNOSIS — I509 Heart failure, unspecified: Secondary | ICD-10-CM | POA: Diagnosis not present

## 2017-03-24 DIAGNOSIS — R262 Difficulty in walking, not elsewhere classified: Secondary | ICD-10-CM | POA: Diagnosis not present

## 2017-03-24 DIAGNOSIS — M1990 Unspecified osteoarthritis, unspecified site: Secondary | ICD-10-CM | POA: Diagnosis not present

## 2017-03-24 DIAGNOSIS — M797 Fibromyalgia: Secondary | ICD-10-CM | POA: Diagnosis not present

## 2017-03-24 DIAGNOSIS — N179 Acute kidney failure, unspecified: Secondary | ICD-10-CM | POA: Diagnosis not present

## 2017-03-24 DIAGNOSIS — E1140 Type 2 diabetes mellitus with diabetic neuropathy, unspecified: Secondary | ICD-10-CM | POA: Diagnosis not present

## 2017-03-24 DIAGNOSIS — G3184 Mild cognitive impairment, so stated: Secondary | ICD-10-CM | POA: Diagnosis not present

## 2017-03-24 DIAGNOSIS — I422 Other hypertrophic cardiomyopathy: Secondary | ICD-10-CM | POA: Diagnosis not present

## 2017-03-24 NOTE — Telephone Encounter (Signed)
° ° ° °  Pt said her heart doctor call and told her that her kidneys were better than what they were. He was well please that she was no longer taking lispril.  Pt call to ask if she still need to do the lab that Dr [name] Ordered     [phone]

## 2017-03-25 NOTE — Telephone Encounter (Signed)
So since she had renal test done through her nephrologist she just needs TSH done. BMP can be cancelled. Thanks, BJ

## 2017-03-28 NOTE — Addendum Note (Signed)
Addended by: Kateri Mc E on: 03/28/2017 08:37 AM   Modules accepted: Orders

## 2017-03-28 NOTE — Telephone Encounter (Signed)
I called and spoke with patient. I advised her that we will still need to check her thyroid, but that we can do it on 10/5 when she comes in instead of her having to come in before that. Patient is in agreement with plan & verbalized understanding.

## 2017-03-29 DIAGNOSIS — N189 Chronic kidney disease, unspecified: Secondary | ICD-10-CM | POA: Diagnosis not present

## 2017-03-29 DIAGNOSIS — J449 Chronic obstructive pulmonary disease, unspecified: Secondary | ICD-10-CM | POA: Diagnosis not present

## 2017-03-29 DIAGNOSIS — L930 Discoid lupus erythematosus: Secondary | ICD-10-CM | POA: Diagnosis not present

## 2017-03-29 DIAGNOSIS — I422 Other hypertrophic cardiomyopathy: Secondary | ICD-10-CM | POA: Diagnosis not present

## 2017-03-29 DIAGNOSIS — R569 Unspecified convulsions: Secondary | ICD-10-CM | POA: Diagnosis not present

## 2017-03-29 DIAGNOSIS — G3184 Mild cognitive impairment, so stated: Secondary | ICD-10-CM | POA: Diagnosis not present

## 2017-03-29 DIAGNOSIS — R262 Difficulty in walking, not elsewhere classified: Secondary | ICD-10-CM | POA: Diagnosis not present

## 2017-03-29 DIAGNOSIS — I509 Heart failure, unspecified: Secondary | ICD-10-CM | POA: Diagnosis not present

## 2017-03-29 DIAGNOSIS — M797 Fibromyalgia: Secondary | ICD-10-CM | POA: Diagnosis not present

## 2017-03-29 DIAGNOSIS — N179 Acute kidney failure, unspecified: Secondary | ICD-10-CM | POA: Diagnosis not present

## 2017-03-29 DIAGNOSIS — I129 Hypertensive chronic kidney disease with stage 1 through stage 4 chronic kidney disease, or unspecified chronic kidney disease: Secondary | ICD-10-CM | POA: Diagnosis not present

## 2017-03-29 DIAGNOSIS — E1140 Type 2 diabetes mellitus with diabetic neuropathy, unspecified: Secondary | ICD-10-CM | POA: Diagnosis not present

## 2017-03-29 DIAGNOSIS — M1990 Unspecified osteoarthritis, unspecified site: Secondary | ICD-10-CM | POA: Diagnosis not present

## 2017-03-31 DIAGNOSIS — R569 Unspecified convulsions: Secondary | ICD-10-CM | POA: Diagnosis not present

## 2017-03-31 DIAGNOSIS — R262 Difficulty in walking, not elsewhere classified: Secondary | ICD-10-CM | POA: Diagnosis not present

## 2017-03-31 DIAGNOSIS — E1140 Type 2 diabetes mellitus with diabetic neuropathy, unspecified: Secondary | ICD-10-CM | POA: Diagnosis not present

## 2017-03-31 DIAGNOSIS — N189 Chronic kidney disease, unspecified: Secondary | ICD-10-CM | POA: Diagnosis not present

## 2017-03-31 DIAGNOSIS — I509 Heart failure, unspecified: Secondary | ICD-10-CM | POA: Diagnosis not present

## 2017-03-31 DIAGNOSIS — J449 Chronic obstructive pulmonary disease, unspecified: Secondary | ICD-10-CM | POA: Diagnosis not present

## 2017-03-31 DIAGNOSIS — I422 Other hypertrophic cardiomyopathy: Secondary | ICD-10-CM | POA: Diagnosis not present

## 2017-03-31 DIAGNOSIS — L930 Discoid lupus erythematosus: Secondary | ICD-10-CM | POA: Diagnosis not present

## 2017-03-31 DIAGNOSIS — M1990 Unspecified osteoarthritis, unspecified site: Secondary | ICD-10-CM | POA: Diagnosis not present

## 2017-03-31 DIAGNOSIS — M797 Fibromyalgia: Secondary | ICD-10-CM | POA: Diagnosis not present

## 2017-03-31 DIAGNOSIS — G3184 Mild cognitive impairment, so stated: Secondary | ICD-10-CM | POA: Diagnosis not present

## 2017-03-31 DIAGNOSIS — I129 Hypertensive chronic kidney disease with stage 1 through stage 4 chronic kidney disease, or unspecified chronic kidney disease: Secondary | ICD-10-CM | POA: Diagnosis not present

## 2017-03-31 DIAGNOSIS — N179 Acute kidney failure, unspecified: Secondary | ICD-10-CM | POA: Diagnosis not present

## 2017-04-06 DIAGNOSIS — I428 Other cardiomyopathies: Secondary | ICD-10-CM | POA: Diagnosis not present

## 2017-04-06 DIAGNOSIS — N179 Acute kidney failure, unspecified: Secondary | ICD-10-CM | POA: Diagnosis not present

## 2017-04-07 NOTE — Progress Notes (Signed)
HPI:   [name] is a 76 y.o. female, who is here today requesting completion of forms, so she came applying for CAP ([facility]) screening.  She has Hx of schizophrenia (unspecific),bipolar disorder, hypothyroidism,HTN,seizure disorder, CAD,CHF, exertional dyspnea, asthma,DM II, HTN, and generalized OA among some. She lives alone, daughters live close by and take turns to take her to her appts.  Seizure disorder,she is on Keppra 500 mg am and 1000 mg pm.  She was seen on 03/11/17. She lives in an apartment, which is administered by [facility]. She states that she pays rent every month and she would like to stay home even regardless of her general limitations due to her chronic illness.  She needs assistance for some ADL's and IADL's. She has difficulty breathing and walking. She does not drive, her daughters usually take her to her appointments and grocery shopping. She has an aid 7 days per week to help with some ADLs and IADLs. 2 days per week for 3 hours/day and 5 times per week 2 hours/day.   Her 2 daughters are actively involved in her care and manage her medications. She is upset because, according to patient, her daughters do not let her manage her own medications. She has a history of medication overdose and since then her daughters took over her medication.  She is suspicious of her daughters in the sense that she is not sure if she is taking all medications she is supposed to be doing so. She doesn't know what medication she is taking, states that she has not seen the list of medications given when she was discharged from the hospital, her daughter "took the paper for me." She is reluctant to believe she had psychiatrics disorders, she has refused treatment. She denies visual or auditive hallucinations.  Hx of insomnia, chronic.  She denies Hx of OSA, which is listed on her problem list. States that she uses supplemental O2 at night, 2 LPM with  [place].   Pain "all over", that sometimes keeps her from sleep. She would like an "adjustable bed." Hx of OA and fibromyalgia. She also reports Hx of lupus, appt with rheumatologist is pending.  HTN,CAD, and CHF, she follows with cardiologist. According to patient, she was told that if "something happens" to her heart there is not much that can be done. Denies orthopnea or PND, she sleeps with her head up but mainly because GERD and post nasal drainage symptoms.  She does not have a power of attorney or living with her. She tells me she doesn't want to be intubated or connected to a ventilator, she doesn't want blood products, she is a Sales promotion account executive Witness.  She is also complaining of persistent nasal congestion, rhinorrhea, postnasal drainage. She tries to cough up mucus up, states that she doesn't want this mucus "to go into my body." She has history of allergic rhinitis, she uses intranasal spray at bedtime,  she feels like supplemental oxygen also tries her nose and makes symptoms worse.    Review of Systems  Constitutional: Positive for fatigue. Negative for appetite change and fever.  HENT: Positive for congestion, hearing loss, postnasal drip and rhinorrhea. Negative for mouth sores, nosebleeds, sinus pain, sore throat and trouble swallowing.   Eyes: Negative for redness and visual disturbance.  Respiratory: Positive for shortness of breath. Negative for cough and wheezing.   Cardiovascular: Positive for leg swelling. Negative for chest pain and palpitations.  Gastrointestinal: Negative for abdominal pain, nausea  and vomiting.       Negative for changes in bowel habits.  Endocrine: Negative for cold intolerance, heat intolerance, polydipsia, polyphagia and polyuria.  Genitourinary: Negative for decreased urine volume, dysuria and hematuria.  Musculoskeletal: Positive for arthralgias, gait problem and myalgias.  Skin: Negative for pallor and rash.  Allergic/Immunologic: Positive for  environmental allergies.  Neurological: Negative for seizures, syncope, weakness and headaches.  Psychiatric/Behavioral: Positive for behavioral problems and sleep disturbance. Negative for suicidal ideas. The patient is nervous/anxious.       Current Outpatient Prescriptions on File Prior to Visit  Medication Sig Dispense Refill  . ACCU-CHEK AVIVA PLUS test strip TEST three times a day 300 each 3  . acetaminophen (TYLENOL) 325 MG tablet Take 650 mg by mouth every 6 (six) hours as needed for mild pain or moderate pain.    Marland Kitchen albuterol (PROAIR HFA) 108 (90 Base) MCG/ACT inhaler inhale 2 puffs by mouth every 4 hours if needed USE ONLY IF YOU ARE WHEEZING 3 Inhaler 3  . amLODipine (NORVASC) 10 MG tablet Take 1 tablet (10 mg total) by mouth daily. 90 tablet 3  . Ascorbic Acid (VITAMIN C PO) Take 1 tablet by mouth daily.    Marland Kitchen aspirin EC 81 MG tablet Take 162 mg by mouth daily.     Marland Kitchen atorvastatin (LIPITOR) 20 MG tablet Take 1 tablet (20 mg total) by mouth daily. 90 tablet 3  . B Complex Vitamins (VITAMIN B COMPLEX) TABS Take 1 tablet by mouth daily.    . Cholecalciferol (VITAMIN D3) 5000 UNITS CAPS Take 5,000 Units by mouth daily.    . Flaxseed, Linseed, (FLAXSEED OIL PO) Take 1 capsule by mouth daily.    . fluticasone (FLOVENT HFA) 220 MCG/ACT inhaler inhale 1 puff by mouth twice a day 36 g 3  . furosemide (LASIX) 20 MG tablet take 1 tablet by mouth once daily 90 tablet 1  . gabapentin (NEURONTIN) 300 MG capsule Take 1 capsule (300 mg total) by mouth 2 (two) times daily. 180 capsule 3  . guaiFENesin (MUCINEX) 600 MG 12 hr tablet Take 1 tablet (600 mg total) by mouth 2 (two) times daily. 20 tablet 0  . Insulin NPH, Human,, Isophane, (HUMULIN N KWIKPEN) 100 UNIT/ML Kiwkpen Inject 30 Units into the skin every morning. 15 mL 3  . Insulin Pen Needle (PEN NEEDLES 31GX5/16") 31G X 8 MM MISC Check Blood sugar three times per day. Dx insulin using E11.8 100 each 3  . levETIRAcetam (KEPPRA) 500 MG tablet  Take 1 tablet (500 mg total) by mouth 2 (two) times daily. 60 tablet 2  . metFORMIN (GLUCOPHAGE) 500 MG tablet Take 1 tablet (500 mg total) by mouth 2 (two) times daily with a meal. 180 tablet 3  . metoprolol succinate (TOPROL-XL) 50 MG 24 hr tablet TAKE ONE TABLET BY MOUTH ONCE DAILY WITH OR IMMEDIATELY FOLLOWING A MEAL 90 tablet 3  . Multiple Vitamin (MULTIVITAMIN WITH MINERALS) TABS tablet Take 1 tablet by mouth daily.    . nitroGLYCERIN (NITROSTAT) 0.4 MG SL tablet Place 1 tablet (0.4 mg total) under the tongue every 5 (five) minutes as needed for chest pain. 20 tablet 12  . pantoprazole (PROTONIX) 40 MG tablet Take 1 tablet (40 mg total) by mouth daily. 90 tablet 3  . psyllium (METAMUCIL SMOOTH TEXTURE) 28 % packet Take 1 packet by mouth 2 (two) times daily. 30 packet 12   No current facility-administered medications on file prior to visit.      Past  Medical History:  Diagnosis Date  . Acute delirium 01/14/2017  . Anxiety   . Arthritis    "all over"  . Asthma   . Bipolar disorder ([place])   . Complication of anesthesia    "w/right foot OR they gave me too much and they couldn't get me woke"  . Congestive heart failure, unspecified   . Coronary artery calcification seen on CAT scan 01/14/2017  . Coronary artery disease    "I've got 1 stent" (06/30/2015)  . Depression   . Discoid lupus   . Fibromyalgia    "I've been told I have this; can't take Lyrica cause I'm allergic to it" (06/30/2015)  . GERD (gastroesophageal reflux disease)   . History of gout   . History of hiatal hernia    "real bad" (06/30/2015)  . Hypertension   . Hypothyroidism   . On home oxygen therapy    "2L q hs" (06/30/2015)  . OSA (obstructive sleep apnea)    "just use my oxygen; no mask" (06/30/2015)  . Other and unspecified hyperlipidemia   . Penetrating foot wound    left nonhealing foot wound on the dorsal surface  . Peripheral neuropathy   . Pneumonia "many of times"  . Refusal of blood transfusions  as patient is Jehovah's Witness   . Renal failure, unspecified    "kidneys not working 100%" (06/30/2015)  . Sickle cell trait ([place])   . Systemic lupus ([place])   . Thoracic aortic atherosclerosis ([place]) 01/14/2017  . Type II diabetes mellitus ([place])   . Unspecified vitamin D deficiency    Past Surgical History:  Procedure Laterality Date  . ANKLE ARTHROSCOPY WITH OPEN REDUCTION INTERNAL FIXATION (ORIF) Right 03/2011  . CARDIAC CATHETERIZATION N/A 07/01/2015   Procedure: Left Heart Cath and Coronary Angiography;  Surgeon: Adrian Prows, MD;  Location: [place] [facility];  Service: Cardiovascular;  Laterality: N/A;  . CARDIAC CATHETERIZATION N/A 07/01/2015   Procedure: Intravascular Pressure Wire/FFR Study;  Surgeon: Adrian Prows, MD;  Location: [place] [facility];  Service: Cardiovascular;  Laterality: N/A;  . CARPAL TUNNEL RELEASE Bilateral   . CATARACT EXTRACTION W/ INTRAOCULAR LENS  IMPLANT, BILATERAL Bilateral   . CHOLECYSTECTOMY OPEN    . COLONOSCOPY N/A 12/10/2013   Procedure: COLONOSCOPY;  Surgeon: Inda Castle, MD;  Location: [facility];  Service: Endoscopy;  Laterality: N/A;  . FRACTURE SURGERY    . INCISION AND DRAINAGE ABSCESS Left    foot "under my little toe"  . KNEE LIGAMENT RECONSTRUCTION Right   . LEFT HEART CATHETERIZATION WITH CORONARY ANGIOGRAM N/A 08/08/2012   Procedure: LEFT HEART CATHETERIZATION WITH CORONARY ANGIOGRAM;  Surgeon: [name], MD;  Location[facility] C[facility] LAB;  Service: Cardiovascular;  Laterality: N/A;  . NASAL SINUS SURGERY    . PERCUTANEOUS CORONARY STENT INTERVENTION (PCI-S) N/A 08/21/2012   Procedure: PERCUTANEOUS CORONARY STENT INTERVENTION (PCI-S);  Surgeon: [name], MD;  Location: [facility] CATH LAB;  Service: Cardiovascular;  Laterality: N/A;  . TUBAL LIGATION      Allergies  Allergen Reactions  . Ace Inhibitors Other (See Comments)    Coincided with sig bump in creat. Retried and creat bumped again.  Beulah Gandy [Isosorb Dinitrate-Hydralazine]  Other (See Comments)    Sleep all the time.  Ebbie Ridge [Buspirone] Other (See Comments)    pain  . Pregabalin Swelling  . Ropinirole Hydrochloride Swelling  . Amantadines Rash  . Penicillins Swelling and Rash    Has patient had a PCN reaction causing immediate  rash, facial/tongue/throat swelling, SOB or lightheadedness with hypotension: Yes Has patient had a PCN reaction causing severe rash involving mucus membranes or skin necrosis: Yes Has patient had a PCN reaction that required hospitalization: Yes Has patient had a PCN reaction occurring within the last 10 years: No If all of the above answers are "NO", then may proceed with Cephalosporin use.    Family History  Problem Relation Age of Onset  . Heart disease Mother   . Diabetes Mother   . Clotting disorder Mother   . Pneumonia Father   . Rheum arthritis Father   . Diabetes Sister   . Diabetes Sister   . Asthma Brother   . Cancer Brother   . Kidney disease Brother   . Lupus Son   . Heart disease Son   . Heart disease Daughter   . Colon cancer Neg Hx     Social History   Social History  . Marital status: Single    Spouse name: N/A  . Number of children: 4  . Years of education: 32 th   Occupational History  . Retired- Engineer, production Retired   Social History Main Topics  . Smoking status: Former Smoker    Packs/day: 3.00    Years: 40.00    Types: Cigarettes    Start date: 07/05/1960    Quit date: 07/05/1998  . Smokeless tobacco: Never Used  . Alcohol use No     Comment: 06/30/2015 "used to drink a little alcohol on the weekends; not q weekend; nothing in years"  . Drug use: No  . Sexual activity: No   Other Topics Concern  . None   Social History Narrative   Health Care POA:    Emergency Contact: daughter, Lorenso Courier [phone] or [name] [phone]   End of Life Plan: Does not want to be ventilated or feeding tubes.    Who lives with you: Lives alone   Any pets: none   Diet: Patient reports  enjoying and eating junk food.  Does not regulate types of food and currently her dentures are broken so it is hard to eat some foods.   Exercise: Patient does not have an exercise plan.   Seatbelts: Patient reports wearing seatbelt when in vehicle.   Sun Exposure/Protection: Patient reports not going outside very often.   Hobbies: Patient enjoys reading the bible and watching game shows.    Right-handed.   1 cup caffeine per day.   Former smoker-stopped 2000   Alcohol none       Vitals:   04/08/17 1106  BP: 130/80  Pulse: 89  Resp: 16   Body mass index is 47.67 kg/m.   Physical Exam  Nursing note and vitals reviewed. Constitutional: She is oriented to person, place, and time. She appears well-developed. No distress.  HENT:  Head: Normocephalic and atraumatic.  Mouth/Throat: Oropharynx is clear and moist. Mucous membranes are dry.  Edentulous   Eyes: Pupils are equal, round, and reactive to light. Conjunctivae are normal.  Cardiovascular: Normal rate and regular rhythm.   No murmur heard. Pulses:      Dorsalis pedis pulses are 2+ on the right side, and 2+ on the left side.  Respiratory: Effort normal and breath sounds normal. No respiratory distress.  GI: Soft. She exhibits no mass. There is no tenderness.  Musculoskeletal: She exhibits edema (Trace pitting edema LE, bilateral.). She exhibits no tenderness.  Tenderness upon palpation of trigger points: Upper,mid and lower back.Also chest wall, upper and  lower extremities. Bilateral,moderate limitation of ROM shoulders, knees,and hips. No signs of synovitis.  Lymphadenopathy:    She has no cervical adenopathy.  Neurological: She is alert and oriented to person, place, and time. Coordination normal.  No Focal deficit appreciated. Gait assisted with a walker.  Skin: Skin is warm. No rash noted. No erythema.  Hypopigmentation scarring changes on scalp with areas of alopecia on parietal area.  Psychiatric: Her mood appears  anxious. Her affect is labile.  Fairly groomed, good eye contact.    ASSESSMENT AND PLAN:   [name] was seen today for paperwork. She was here alone this time, in the past she has had one of her daughters with her but this time her daughter stayed in the waiting room. No new problems, all problems discussed today are chronic and otherwise stable.  Diagnoses and all orders for this visit:  Unstable gait  She is able to walk with her walker,no recent falls. She needs help with getting in her shower,bathing. Fall prevention tips discussed.  OSTEOARTHROSIS, GENERALIZED, MULTIPLE SITES  Limiting mobility. Acetaminophen 325 mg tab , she can take max 8 tabs daily, so 2 tabs qid as needed. Wt loss may also help.  Allergic rhinitis, unspecified seasonality, unspecified trigger  Educated about Dx as well as treatment options discussed. Nasal irrigations with saline as needed. Plain Mucinex can be continued. OTC Allegra 180 mg may help. Some side effects of intranasal steroid spray discussed.  Insomnia, unspecified type  Good sleep hygiene discussed. Hospital bed Rx given. She takes Gabapentin at bedtime,which may help some but because she is also on Keppra ,I do not want to dose.  Fibromyalgia  We discussed Dx and treatment recommendations. She can continue Gabapentin 300 mg bid. We could discuss Cymbalta in the future.  Chronic combined systolic and diastolic CHF, NYHA class 2 ([place])  Continue following with cardiologists. No changes in Furosemide ,Metoprolol, and Cozaar. Low salt diet also recommended.  Morbid obesity ([place])  Portion control. Physical activity limited due to pain and body habits but daily walking for a few minutes as tolerated can be arranged through CAP program.  Current moderate episode of major depressive disorder without prior episode ([place])  Denies Hx of bipolar disorder or schizophrenic disorder. She refused psych evaluation.  Need for influenza  vaccination -     Flu vaccine HIGH DOSE PF  Hypothyroidism, unspecified type -     TSH     50 min face to face OV. > 50% was dedicated to discussion of all above Dx, prognosis, treatment options, and side effects of some of her medications.Fall precautions,end of life planning, and plan of care. CAP form will be filled out, she took a few pages with her that she needs to fill out. She would like to pick up form when completed.      Helmut Hennon G. [name], MD  [facility]. [place] office.

## 2017-04-08 ENCOUNTER — Other Ambulatory Visit: Payer: Medicare Other

## 2017-04-08 ENCOUNTER — Ambulatory Visit (INDEPENDENT_AMBULATORY_CARE_PROVIDER_SITE_OTHER): Payer: Medicare Other | Admitting: Family Medicine

## 2017-04-08 ENCOUNTER — Encounter: Payer: Self-pay | Admitting: Family Medicine

## 2017-04-08 VITALS — BP 130/80 | HR 89 | Resp 16 | Ht 63.0 in | Wt 269.1 lb

## 2017-04-08 DIAGNOSIS — E039 Hypothyroidism, unspecified: Secondary | ICD-10-CM | POA: Diagnosis not present

## 2017-04-08 DIAGNOSIS — M797 Fibromyalgia: Secondary | ICD-10-CM | POA: Diagnosis not present

## 2017-04-08 DIAGNOSIS — R2681 Unsteadiness on feet: Secondary | ICD-10-CM | POA: Diagnosis not present

## 2017-04-08 DIAGNOSIS — J309 Allergic rhinitis, unspecified: Secondary | ICD-10-CM | POA: Diagnosis not present

## 2017-04-08 DIAGNOSIS — I5042 Chronic combined systolic (congestive) and diastolic (congestive) heart failure: Secondary | ICD-10-CM

## 2017-04-08 DIAGNOSIS — F321 Major depressive disorder, single episode, moderate: Secondary | ICD-10-CM

## 2017-04-08 DIAGNOSIS — G4700 Insomnia, unspecified: Secondary | ICD-10-CM

## 2017-04-08 DIAGNOSIS — M159 Polyosteoarthritis, unspecified: Secondary | ICD-10-CM | POA: Diagnosis not present

## 2017-04-08 DIAGNOSIS — Z23 Encounter for immunization: Secondary | ICD-10-CM | POA: Diagnosis not present

## 2017-04-08 LAB — TSH: TSH: 0.63 u[IU]/mL (ref 0.35–4.50)

## 2017-04-08 NOTE — Patient Instructions (Signed)
A few things to remember from today's visit:   Need for influenza vaccination - Plan: Flu vaccine HIGH DOSE PF  Allergic rhinitis, unspecified seasonality, unspecified trigger  Insomnia, unspecified type  Fibromyalgia  OSTEOARTHROSIS, GENERALIZED, MULTIPLE SITES  Unstable gait  Form will be completed and faxed to agency. Please be sure medication list is accurate. If a new problem present, please set up appointment sooner than planned today.

## 2017-04-11 ENCOUNTER — Telehealth: Payer: Self-pay | Admitting: Family Medicine

## 2017-04-11 NOTE — Telephone Encounter (Signed)
Please advise on refill.

## 2017-04-11 NOTE — Telephone Encounter (Signed)
Pt request refill  atorvastatin (LIPITOR) 20 MG tablet  Dr [name] has never filled this before and pt has switched pharmacy that she used the last time for this Rx  [facility]   ([facility] [place], [place]

## 2017-04-12 ENCOUNTER — Encounter: Payer: Self-pay | Admitting: Family Medicine

## 2017-04-12 MED ORDER — ATORVASTATIN CALCIUM 20 MG PO TABS: 20.0000 mg | ORAL_TABLET | Freq: Every day | ORAL | 1 refills | Status: DC

## 2017-04-12 NOTE — Telephone Encounter (Signed)
If she has been on this medication and no side effects, it is OK to fill it. She follows with cardiologists, not sure if she got Rx from him. Thanks

## 2017-04-12 NOTE — Telephone Encounter (Signed)
Rx sent in

## 2017-04-13 DIAGNOSIS — J45909 Unspecified asthma, uncomplicated: Secondary | ICD-10-CM | POA: Diagnosis not present

## 2017-04-13 DIAGNOSIS — I10 Essential (primary) hypertension: Secondary | ICD-10-CM | POA: Diagnosis not present

## 2017-04-13 DIAGNOSIS — I428 Other cardiomyopathies: Secondary | ICD-10-CM | POA: Diagnosis not present

## 2017-04-13 DIAGNOSIS — I2510 Atherosclerotic heart disease of native coronary artery without angina pectoris: Secondary | ICD-10-CM | POA: Diagnosis not present

## 2017-04-18 ENCOUNTER — Telehealth: Payer: Self-pay | Admitting: Family Medicine

## 2017-04-18 DIAGNOSIS — M159 Polyosteoarthritis, unspecified: Secondary | ICD-10-CM

## 2017-04-18 DIAGNOSIS — M797 Fibromyalgia: Secondary | ICD-10-CM

## 2017-04-18 NOTE — Telephone Encounter (Signed)
° °  Pt call to say she contacted [facility] about a hospital bed and was told by [facility] that the doctor office need to contact them about this order

## 2017-04-18 NOTE — Telephone Encounter (Signed)
Order placed in EPIC & message sent to [facility].

## 2017-04-19 ENCOUNTER — Telehealth: Payer: Self-pay

## 2017-04-19 NOTE — Telephone Encounter (Signed)
I called and spoke with patient to let her know that the hard copy of her forms is up front for her to pick up. A copy has been faxed as well. Patient verbalized understanding.

## 2017-04-20 ENCOUNTER — Ambulatory Visit: Payer: Medicare Other | Admitting: Family Medicine

## 2017-04-22 ENCOUNTER — Other Ambulatory Visit: Payer: Self-pay | Admitting: Neurology

## 2017-04-25 ENCOUNTER — Ambulatory Visit (INDEPENDENT_AMBULATORY_CARE_PROVIDER_SITE_OTHER): Payer: Medicare Other | Admitting: Neurology

## 2017-04-25 ENCOUNTER — Encounter: Payer: Self-pay | Admitting: Neurology

## 2017-04-25 VITALS — BP 126/78 | HR 99 | Ht 63.0 in | Wt 270.0 lb

## 2017-04-25 DIAGNOSIS — I5042 Chronic combined systolic (congestive) and diastolic (congestive) heart failure: Secondary | ICD-10-CM | POA: Diagnosis not present

## 2017-04-25 DIAGNOSIS — F329 Major depressive disorder, single episode, unspecified: Secondary | ICD-10-CM

## 2017-04-25 DIAGNOSIS — G40209 Localization-related (focal) (partial) symptomatic epilepsy and epileptic syndromes with complex partial seizures, not intractable, without status epilepticus: Secondary | ICD-10-CM | POA: Insufficient documentation

## 2017-04-25 DIAGNOSIS — F32A Depression, unspecified: Secondary | ICD-10-CM

## 2017-04-25 DIAGNOSIS — J45909 Unspecified asthma, uncomplicated: Secondary | ICD-10-CM | POA: Diagnosis not present

## 2017-04-25 DIAGNOSIS — I10 Essential (primary) hypertension: Secondary | ICD-10-CM | POA: Diagnosis not present

## 2017-04-25 MED ORDER — LEVETIRACETAM 500 MG PO TABS: 500.0000 mg | ORAL_TABLET | Freq: Two times a day (BID) | ORAL | 11 refills | Status: DC

## 2017-04-25 NOTE — Patient Instructions (Addendum)
1. Continue Keppra 500mg  twice a day 2. Refer to Behavioral Medicine for Psychiatry and therapy  We have sent a referral to [facility] for psychiatry and therapy.  Please call [phone] to schedule your first appointment.   3. Follow-up in 6 months, call for any changes  Seizure Precautions: 1. If medication has been prescribed for you to prevent seizures, take it exactly as directed.  Do not stop taking the medicine without talking to your doctor first, even if you have not had a seizure in a long time.   2. Avoid activities in which a seizure would cause danger to yourself or to others.  Don't operate dangerous machinery, swim alone, or climb in high or dangerous places, such as on ladders, roofs, or girders.  Do not drive unless your doctor says you may.  3. If you have any warning that you may have a seizure, lay down in a safe place where you can't hurt yourself.    4.  No driving for 6 months from last seizure, as per [facility].   Please refer to the following link on the [facility] website for more information: http://www.epilepsyfoundation.org/answerplace/Social/driving/drivingu.cfm   5.  Maintain good sleep hygiene. Avoid alcohol.  6.  Contact your doctor if you have any problems that may be related to the medicine you are taking.  7.  Call 911 and bring the patient back to the ED if:        A.  The seizure lasts longer than 5 minutes.       B.  The patient doesn't awaken shortly after the seizure  C.  The patient has new problems such as difficulty seeing, speaking or moving  D.  The patient was injured during the seizure  E.  The patient has a temperature over 102 F (39C)  F.  The patient vomited and now is having trouble breathing

## 2017-04-25 NOTE — Progress Notes (Signed)
NEUROLOGY CONSULTATION NOTE  Brittany Roberts MRN: [id number] DOB: [birth year]  Referring provider: Dr. Betty [name] Primary care provider: Dr. Betty [name]  Reason for consult:  seizures  Dear Dr [name]:  Thank you for your kind referral of Brittany Roberts for consultation of the above symptoms. Although her history is well known to you, please allow me to reiterate it for the purpose of our medical record. She is alone in the office today. History mostly obtained from [facility] records. Records and images were personally reviewed where available.  HISTORY OF PRESENT ILLNESS: This is a 76 year old right-handed woman with a complicated medical history including hypertension, hyperlipidemia, diabetes, hypothyroidism, COPD, lupus, chronic pain, schizophrenia, presenting after seizure-like activity last 01/13/2017. She recalls she was hurting all over the night before and could not get comfortable, she walked the floors all night, recalls sitting in her recliner, then waking up in the hospital. She reports that her daughter called and no one picked up the phone. Per ER notes, she had a witnessed episode of jerking movements with tonic head deviation to the left lasting 2-5 minutes, followed by confusion, agitation, then somnolence. She had urinary incontinence. Family reported she was becoming progressively confused for 2 weeks prior to the seizure. She had been recently started on Cymbalta and Seroquel, which were discontinued in the hospital, in addition to Trazodone and Tramadol. She had an MRI brain with and without contrast which I personally reviewed, no acute changes, there was focal encephalomalacia in the right frontal operculum, superior insular cortex, with ex vacuo dilation of the right lateral ventricle. There was moderate chronic microvascular disease. No abnormal enhancement. Her EEG showed diffuse slowing, no epileptiform discharges. She was discharged home on Keppra 500mg  BID.  She presents today  alone in the office, as far as she knows she has not had any further seizures. She denies any side effects to the Keppra. She lives alone, but her children come twice a day to administer medications. She reports that prior to hospital stay, she was in charge of her medications and that people were concerned she was overmedicated and taking too much medication. She had also been driving prior to the seizure, but has stopped since then. She denies being told of any staring/unresponsive episodes, she denies any gaps in time, olfactory/gustatory hallucinations, rising epigastric sensation, myoclonic jerks. She has neuropathy in both feet and takes gabapentin 300mg  BID. She has chronic pain from osteoarthritis, and reports her right leg occasionally just gives out on her. She reports "I don't have much memory," and became tearful in the office, reporting thoughts of worthlessness because she has been so sick. She states "I don't think much of myself." She denies any suicidal ideation.   Epilepsy Risk Factors: Right frontal encephalomalacia. Her brother had seizures in childhood and died from a tractor accident when he had a seizure. Otherwise she had a normal birth and early development, no history of febrile convulsions, CNS infections, significant head injuries, or neurosurgical procedures.    PAST MEDICAL HISTORY: Past Medical History:  Diagnosis Date  . Acute delirium 01/14/2017  . Anxiety   . Arthritis    "all over"  . Asthma   . Bipolar disorder ([place])   . Complication of anesthesia    "w/right foot OR they gave me too much and they couldn't get me woke"  . Congestive heart failure, unspecified   . Coronary artery calcification seen on CAT scan 01/14/2017  . Coronary artery disease    "  I've got 1 stent" (06/30/2015)  . Depression   . Discoid lupus   . Fibromyalgia    "I've been told I have this; can't take Lyrica cause I'm allergic to it" (06/30/2015)  . GERD (gastroesophageal reflux disease)     . History of gout   . History of hiatal hernia    "real bad" (06/30/2015)  . Hypertension   . Hypothyroidism   . On home oxygen therapy    "2L q hs" (06/30/2015)  . OSA (obstructive sleep apnea)    "just use my oxygen; no mask" (06/30/2015)  . Other and unspecified hyperlipidemia   . Penetrating foot wound    left nonhealing foot wound on the dorsal surface  . Peripheral neuropathy   . Pneumonia "many of times"  . Refusal of blood transfusions as patient is Jehovah's Witness   . Renal failure, unspecified    "kidneys not working 100%" (06/30/2015)  . Sickle cell trait ([place])   . Systemic lupus ([place])   . Thoracic aortic atherosclerosis ([place]) 01/14/2017  . Type II diabetes mellitus ([place])   . Unspecified vitamin D deficiency     PAST SURGICAL HISTORY: Past Surgical History:  Procedure Laterality Date  . ANKLE ARTHROSCOPY WITH OPEN REDUCTION INTERNAL FIXATION (ORIF) Right 03/2011  . CARDIAC CATHETERIZATION N/A 07/01/2015   Procedure: Left Heart Cath and Coronary Angiography;  Surgeon: Adrian Prows, MD;  Location: [place] [facility];  Service: Cardiovascular;  Laterality: N/A;  . CARDIAC CATHETERIZATION N/A 07/01/2015   Procedure: Intravascular Pressure Wire/FFR Study;  Surgeon: Adrian Prows, MD;  Location: [place] [facility];  Service: Cardiovascular;  Laterality: N/A;  . CARPAL TUNNEL RELEASE Bilateral   . CATARACT EXTRACTION W/ INTRAOCULAR LENS  IMPLANT, BILATERAL Bilateral   . CHOLECYSTECTOMY OPEN    . COLONOSCOPY N/A 12/10/2013   Procedure: COLONOSCOPY;  Surgeon: Inda Castle, MD;  Location: [facility];  Service: Endoscopy;  Laterality: N/A;  . FRACTURE SURGERY    . INCISION AND DRAINAGE ABSCESS Left    foot "under my little toe"  . KNEE LIGAMENT RECONSTRUCTION Right   . LEFT HEART CATHETERIZATION WITH CORONARY ANGIOGRAM N/A 08/08/2012   Procedure: LEFT HEART CATHETERIZATION WITH CORONARY ANGIOGRAM;  Surgeon: [name], MD;  Location[facility] LAB;  Service: Cardiovascular;   Laterality: N/A;  . NASAL SINUS SURGERY    . PERCUTANEOUS CORONARY STENT INTERVENTION (PCI-S) N/A 08/21/2012   Procedure: PERCUTANEOUS CORONARY STENT INTERVENTION (PCI-S);  Surgeon: [name], MD;  Location[facility];  Service: Cardiovascular;  Laterality: N/A;  . TUBAL LIGATION      MEDICATIONS: Current Outpatient Prescriptions on File Prior to Visit  Medication Sig Dispense Refill  . ACCU-CHEK AVIVA PLUS test strip TEST three times a day 300 each 3  . acetaminophen (TYLENOL) 325 MG tablet Take 650 mg by mouth every 6 (six) hours as needed for mild pain or moderate pain.    Marland Kitchen albuterol (PROAIR HFA) 108 (90 Base) MCG/ACT inhaler inhale 2 puffs by mouth every 4 hours if needed USE ONLY IF YOU ARE WHEEZING 3 Inhaler 3  . amLODipine (NORVASC) 10 MG tablet Take 1 tablet (10 mg total) by mouth daily. 90 tablet 3  . Ascorbic Acid (VITAMIN C PO) Take 1 tablet by mouth daily.    Marland Kitchen aspirin EC 81 MG tablet Take 162 mg by mouth daily.     Marland Kitchen atorvastatin (LIPITOR) 20 MG tablet Take 1 tablet (20 mg total) by mouth daily. 90 tablet 1  .  B Complex Vitamins (VITAMIN B COMPLEX) TABS Take 1 tablet by mouth daily.    . Cholecalciferol (VITAMIN D3) 5000 UNITS CAPS Take 5,000 Units by mouth daily.    . Flaxseed, Linseed, (FLAXSEED OIL PO) Take 1 capsule by mouth daily.    . fluticasone (FLOVENT HFA) 220 MCG/ACT inhaler inhale 1 puff by mouth twice a day 36 g 3  . furosemide (LASIX) 20 MG tablet take 1 tablet by mouth once daily 90 tablet 1  . gabapentin (NEURONTIN) 300 MG capsule Take 1 capsule (300 mg total) by mouth 2 (two) times daily. 180 capsule 3  . guaiFENesin (MUCINEX) 600 MG 12 hr tablet Take 1 tablet (600 mg total) by mouth 2 (two) times daily. 20 tablet 0  . Insulin NPH, Human,, Isophane, (HUMULIN N KWIKPEN) 100 UNIT/ML Kiwkpen Inject 30 Units into the skin every morning. 15 mL 3  . Insulin Pen Needle (PEN NEEDLES 31GX5/16") 31G X 8 MM MISC Check Blood sugar three times per day. Dx insulin  using E11.8 100 each 3  . levETIRAcetam (KEPPRA) 500 MG tablet Take 1 tablet (500 mg total) by mouth 2 (two) times daily. 60 tablet 2  . metFORMIN (GLUCOPHAGE) 500 MG tablet Take 1 tablet (500 mg total) by mouth 2 (two) times daily with a meal. 180 tablet 3  . metoprolol succinate (TOPROL-XL) 50 MG 24 hr tablet TAKE ONE TABLET BY MOUTH ONCE DAILY WITH OR IMMEDIATELY FOLLOWING A MEAL 90 tablet 3  . Multiple Vitamin (MULTIVITAMIN WITH MINERALS) TABS tablet Take 1 tablet by mouth daily.    . nitroGLYCERIN (NITROSTAT) 0.4 MG SL tablet Place 1 tablet (0.4 mg total) under the tongue every 5 (five) minutes as needed for chest pain. 20 tablet 12  . pantoprazole (PROTONIX) 40 MG tablet Take 1 tablet (40 mg total) by mouth daily. 90 tablet 3  . psyllium (METAMUCIL SMOOTH TEXTURE) 28 % packet Take 1 packet by mouth 2 (two) times daily. 30 packet 12   No current facility-administered medications on file prior to visit.     ALLERGIES: Allergies  Allergen Reactions  . Ace Inhibitors Other (See Comments)    Coincided with sig bump in creat. Retried and creat bumped again.  Beulah Gandy [Isosorb Dinitrate-Hydralazine] Other (See Comments)    Sleep all the time.  Ebbie Ridge [Buspirone] Other (See Comments)    pain  . Pregabalin Swelling  . Ropinirole Hydrochloride Swelling  . Amantadines Rash  . Penicillins Swelling and Rash    Has patient had a PCN reaction causing immediate rash, facial/tongue/throat swelling, SOB or lightheadedness with hypotension: Yes Has patient had a PCN reaction causing severe rash involving mucus membranes or skin necrosis: Yes Has patient had a PCN reaction that required hospitalization: Yes Has patient had a PCN reaction occurring within the last 10 years: No If all of the above answers are "NO", then may proceed with Cephalosporin use.     FAMILY HISTORY: Family History  Problem Relation Age of Onset  . Heart disease Mother   . Diabetes Mother   . Clotting disorder Mother     . Pneumonia Father   . Rheum arthritis Father   . Diabetes Sister   . Diabetes Sister   . Asthma Brother   . Cancer Brother   . Kidney disease Brother   . Lupus Son   . Heart disease Son   . Heart disease Daughter   . Colon cancer Neg Hx     SOCIAL HISTORY: Social History  Social History  . Marital status: Single    Spouse name: N/A  . Number of children: 4  . Years of education: 78 th   Occupational History  . Retired- Engineer, production Retired   Social History Main Topics  . Smoking status: Former Smoker    Packs/day: 3.00    Years: 40.00    Types: Cigarettes    Start date: 07/05/1960    Quit date: 07/05/1998  . Smokeless tobacco: Never Used  . Alcohol use No     Comment: 06/30/2015 "used to drink a little alcohol on the weekends; not q weekend; nothing in years"  . Drug use: No  . Sexual activity: No   Other Topics Concern  . Not on file   Social History Narrative   Health Care POA:    Emergency Contact: daughter, Lorenso Courier [phone] or [name] [phone]   End of Life Plan: Does not want to be ventilated or feeding tubes.    Who lives with you: Lives alone   Any pets: none   Diet: Patient reports enjoying and eating junk food.  Does not regulate types of food and currently her dentures are broken so it is hard to eat some foods.   Exercise: Patient does not have an exercise plan.   Seatbelts: Patient reports wearing seatbelt when in vehicle.   Sun Exposure/Protection: Patient reports not going outside very often.   Hobbies: Patient enjoys reading the bible and watching game shows.    Right-handed.   1 cup caffeine per day.   Former smoker-stopped 2000   Alcohol none       REVIEW OF SYSTEMS: Constitutional: No fevers, chills, or sweats, no generalized fatigue, change in appetite Eyes: No visual changes, double vision, eye pain Ear, nose and throat: No hearing loss, ear pain, nasal congestion, sore throat Cardiovascular: No chest pain,  palpitations Respiratory:  No shortness of breath at rest or with exertion, wheezes GastrointestinaI: No nausea, vomiting, diarrhea, abdominal pain, fecal incontinence Genitourinary:  No dysuria, urinary retention or frequency Musculoskeletal:  + neck pain, back pain Integumentary: No rash, pruritus, skin lesions Neurological: as above Psychiatric: + depression, insomnia, anxiety Endocrine: No palpitations, fatigue, diaphoresis, mood swings, change in appetite, change in weight, increased thirst Hematologic/Lymphatic:  No anemia, purpura, petechiae. Allergic/Immunologic: no itchy/runny eyes, nasal congestion, recent allergic reactions, rashes  PHYSICAL EXAM: Vitals:   04/25/17 1016  BP: 126/78  Pulse: 99  SpO2: 90%   General: No acute distress, became tearful at several points in the visit Head:  Normocephalic/atraumatic, edentulous Eyes: Fundoscopic exam shows bilateral sharp discs, no vessel changes, exudates, or hemorrhages Neck: supple, no paraspinal tenderness, full range of motion Back: No paraspinal tenderness Heart: regular rate and rhythm Lungs: Clear to auscultation bilaterally. Vascular: No carotid bruits. Skin/Extremities: No rash, no edema Neurological Exam: Mental status: alert and oriented to person, place, and time, no dysarthria or aphasia, Fund of knowledge is appropriate.  Recent and remote memory are impaired.  Attention and concentration are normal.    Able to name objects and repeat phrases. Cranial nerves: CN I: not tested CN II: pupils equal, round and reactive to light, visual fields intact, fundi unremarkable. CN III, IV, VI:  full range of motion, no nystagmus, no ptosis CN V: decreased pin on left V1-3, intact cold CN VII: upper and lower face symmetric CN VIII: hearing intact to finger rub CN IX, X: gag intact, uvula midline CN XI: sternocleidomastoid and trapezius muscles intact CN XII: tongue midline  Bulk & Tone: normal, no  fasciculations. Motor: 5/5 throughout with no pronator drift. Sensation: decreased pin on left UE and LE, intact pin. Decreased vibration to knees bilaterally.  Deep Tendon Reflexes: +1 throughout, no ankle clonus Plantar responses: downgoing bilaterally Cerebellar: no incoordination on finger to nose testing Gait: unsteady, limping due to back pain, more stable with walker Tremor: none  IMPRESSION: This is a 76 year old right-handed woman with a complicated medical history including hypertension, hyperlipidemia, diabetes, hypothyroidism, COPD, lupus, chronic pain, schizophrenia, presenting after seizure-like activity last 01/13/2017. She was described to have body jerking with head version to the left. Family was reporting worsening confusion for 2 weeks prior to the seizure. Semiology of seizure suggestive of right hemisphere onset, she has encephalomalacia in the right frontal region. She was unaware of a prior history of stroke. EEG showed diffuse slowing. Agree with continuing seizure medication, continue Keppra 500mg  BID. We discussed that if seizures recur, we may need to increase dose of medication. Family is now in charge of her medications. She is very tearful in the office today and expressed thoughts of worthlessness, she is agreeable to seeing Behavioral medicine for psychiatry and therapy.  [place] driving laws were discussed with the patient, and she knows to stop driving after a seizure, until 6 months seizure-free. She will follow-up in 6 months and knows to call for any changes.   Thank you for allowing me to participate in the care of this patient. Please do not hesitate to call for any questions or concerns.   Ellouise Newer, M.D.  CC: Dr. [name]

## 2017-04-28 ENCOUNTER — Other Ambulatory Visit: Payer: Self-pay | Admitting: Family Medicine

## 2017-04-28 DIAGNOSIS — Z1231 Encounter for screening mammogram for malignant neoplasm of breast: Secondary | ICD-10-CM

## 2017-05-03 ENCOUNTER — Telehealth: Payer: Self-pay | Admitting: Family Medicine

## 2017-05-03 NOTE — Telephone Encounter (Signed)
° ° ° °  Pt would like a call back concerning hospital bed,she said it is not working for her. She said she appreciates all that you did to get her the bed . She said she want them to come pick up the bed.    [phone]

## 2017-05-03 NOTE — Telephone Encounter (Signed)
Message sent to Darlina Guys from [facility] to see how we go about having the bed picked up.

## 2017-05-03 NOTE — Telephone Encounter (Signed)
I called and spoke with patient. I made her aware that I am waiting for a response about [facility] coming to pick up the hospital bed. I advised her that I would make her aware once I hear back from [facility]. Patient verbalized understanding.

## 2017-05-04 NOTE — Telephone Encounter (Signed)
I called and spoke with patient. [facility] will come pick up the bed today between 10-3. Patient is already aware & nothing further needed.

## 2017-05-10 DIAGNOSIS — H524 Presbyopia: Secondary | ICD-10-CM | POA: Diagnosis not present

## 2017-05-10 DIAGNOSIS — E119 Type 2 diabetes mellitus without complications: Secondary | ICD-10-CM | POA: Diagnosis not present

## 2017-05-11 ENCOUNTER — Other Ambulatory Visit: Payer: Self-pay | Admitting: Pharmacist

## 2017-05-11 NOTE — Patient Outreach (Signed)
Incoming call from [facility] Presume in response to the [facility] Medication Adherence Campaign. Speak with patient. HIPAA identifiers verified and verbal consent received.  Ms. Belnap reports that she believes that she is taking atorvastatin once daily as directed. Patient reports that since she was in the hospital a few months ago, her daughters have taken over managing her medications. Patient reports that they use a pillbox and that one daughter gives her medications in the morning and that the other comes in the evenings to give her the evening doses. Ms. Alers checks and reports that the atorvastatin is on the medication list that her daughters use. However, reports that she will double check with her daughters when they come that she is taking this medication as directed.   Ms. Stallworth reports that she has been wondering whether she still needs to take pantoprazole daily to manage her acid reflux symptoms. Discuss this medication and both benefits of use as well as concerns about long term use with the patient. Encourage patient to discuss the medication with her PCP at their upcoming visit. Ms. Wease agrees to this plan.  Patient denies any further medication questions/concerns at this time. Provide patient with my phone number.  Harlow Asa, PharmD, [facility] [phone]

## 2017-05-13 DIAGNOSIS — H6123 Impacted cerumen, bilateral: Secondary | ICD-10-CM | POA: Diagnosis not present

## 2017-05-13 DIAGNOSIS — H903 Sensorineural hearing loss, bilateral: Secondary | ICD-10-CM | POA: Insufficient documentation

## 2017-05-14 DIAGNOSIS — J45909 Unspecified asthma, uncomplicated: Secondary | ICD-10-CM | POA: Diagnosis not present

## 2017-05-24 ENCOUNTER — Other Ambulatory Visit: Payer: Self-pay | Admitting: Family Medicine

## 2017-05-24 DIAGNOSIS — E1149 Type 2 diabetes mellitus with other diabetic neurological complication: Secondary | ICD-10-CM

## 2017-05-25 ENCOUNTER — Ambulatory Visit (INDEPENDENT_AMBULATORY_CARE_PROVIDER_SITE_OTHER): Payer: Medicare Other | Admitting: Family Medicine

## 2017-05-25 ENCOUNTER — Encounter: Payer: Self-pay | Admitting: Family Medicine

## 2017-05-25 VITALS — BP 124/84 | HR 79 | Temp 98.0°F | Resp 16 | Ht 63.0 in | Wt 265.4 lb

## 2017-05-25 DIAGNOSIS — R0600 Dyspnea, unspecified: Secondary | ICD-10-CM

## 2017-05-25 DIAGNOSIS — J441 Chronic obstructive pulmonary disease with (acute) exacerbation: Secondary | ICD-10-CM | POA: Diagnosis not present

## 2017-05-25 DIAGNOSIS — R059 Cough, unspecified: Secondary | ICD-10-CM

## 2017-05-25 DIAGNOSIS — R0609 Other forms of dyspnea: Secondary | ICD-10-CM

## 2017-05-25 DIAGNOSIS — K5900 Constipation, unspecified: Secondary | ICD-10-CM

## 2017-05-25 DIAGNOSIS — R05 Cough: Secondary | ICD-10-CM

## 2017-05-25 LAB — CBC WITH DIFFERENTIAL/PLATELET
Basophils Absolute: 0.1 10*3/uL (ref 0.0–0.1)
Basophils Relative: 1.1 % (ref 0.0–3.0)
Eosinophils Absolute: 0.3 10*3/uL (ref 0.0–0.7)
Eosinophils Relative: 3.2 % (ref 0.0–5.0)
HCT: 35.4 % — ABNORMAL LOW (ref 36.0–46.0)
Hemoglobin: 11.5 g/dL — ABNORMAL LOW (ref 12.0–15.0)
Lymphocytes Relative: 27.7 % (ref 12.0–46.0)
Lymphs Abs: 2.7 10*3/uL (ref 0.7–4.0)
MCHC: 32.3 g/dL (ref 30.0–36.0)
MCV: 74 fl — ABNORMAL LOW (ref 78.0–100.0)
Monocytes Absolute: 1.1 10*3/uL — ABNORMAL HIGH (ref 0.1–1.0)
Monocytes Relative: 11.4 % (ref 3.0–12.0)
Neutro Abs: 5.6 10*3/uL (ref 1.4–7.7)
Neutrophils Relative %: 56.6 % (ref 43.0–77.0)
Platelets: 278 10*3/uL (ref 150.0–400.0)
RBC: 4.79 Mil/uL (ref 3.87–5.11)
RDW: 15 % (ref 11.5–15.5)
WBC: 9.9 10*3/uL (ref 4.0–10.5)

## 2017-05-25 LAB — BASIC METABOLIC PANEL
BUN: 39 mg/dL — ABNORMAL HIGH (ref 6–23)
CO2: 25 mEq/L (ref 19–32)
Calcium: 9.6 mg/dL (ref 8.4–10.5)
Chloride: 103 mEq/L (ref 96–112)
Creatinine, Ser: 1.76 mg/dL — ABNORMAL HIGH (ref 0.40–1.20)
GFR: 35.95 mL/min — ABNORMAL LOW (ref >60.00)
Glucose, Bld: 104 mg/dL — ABNORMAL HIGH (ref 70–99)
Potassium: 4.3 mEq/L (ref 3.5–5.1)
Sodium: 137 mEq/L (ref 135–145)

## 2017-05-25 MED ORDER — BUDESONIDE-FORMOTEROL FUMARATE 160-4.5 MCG/ACT IN AERO: 2.0000 | INHALATION_SPRAY | Freq: Two times a day (BID) | RESPIRATORY_TRACT | 3 refills | Status: DC

## 2017-05-25 NOTE — Patient Instructions (Signed)
A few things to remember from today's visit:   Chronic obstructive pulmonary disease with acute exacerbation ([place]) - Plan: DG Chest 2 View, CBC with Differential/Platelet  Constipation, unspecified constipation type - Plan: Basic metabolic panel  Exertional dyspnea - Plan: DG Chest 2 View, Basic metabolic panel  Daily Metamucil.  Albuterol inh 2 puff every 6 hours for a week then as needed for wheezing or shortness of breath.   Symbicort started and stop Flovent.  Today X ray was ordered.  This can be done at [facility] between 8 am and 5 pm: [place]. [phone].   Please be sure medication list is accurate. If a new problem present, please set up appointment sooner than planned today.

## 2017-05-25 NOTE — Progress Notes (Signed)
ACUTE VISIT  HPI:  Chief Complaint  Patient presents with  . Cough    BrittanyBrittany Roberts is a 76 y.o.female here today complaining of 3-4 weeks of cough.   She has Hx of COPD/asthma, she is on Albuterol inh, which she used today because of wheezing: "Not much."  Initially when symptoms started she had subjective fever.  Cough  This is a recurrent problem. The current episode started 1 to 4 weeks ago. The problem has been waxing and waning. The problem occurs every few hours. The cough is productive of sputum. Associated symptoms include nasal congestion, postnasal drip, rhinorrhea, shortness of breath and wheezing. Pertinent negatives include no chest pain, chills, ear congestion, ear pain, eye redness, fever, headaches, heartburn, hemoptysis, rash or sore throat. The symptoms are aggravated by exercise. Risk factors for lung disease include smoking/tobacco exposure. She has tried a beta-agonist inhaler and steroid inhaler for the symptoms. The treatment provided mild relief. Her past medical history is significant for asthma, COPD and environmental allergies.    Productive cough, worse in the morning, whitish sputum. Dyspnea, which she has reported before but she feels like it is worse for the past few days.  Hx of CHF, she follows with cardiologist. Sleeping on same number of pillow, denies PND or worsening LE edema.   No Hx of recent travel. No sick contact. No known insect bite. Former smoker.  Hx of allergies: Yes  OTC medications for this problem: "Cough syrup"   She also mentions feeling "sick", nauseated. When asked about vomiting she states that "not much."   Sh attributes problem to constipation because defecation alleviates nausea.  Constipation is chronic. States that she has to take Metamucil in order to have a good bowel movement; otherwise she will have small stools. No abdominal pain, melena,or blood in stools.    Lab Results  Component Value  Date   TSH 0.63 04/08/2017    Hx of GERD, she is on Protonix 40 mg daily.  DM II, has not checked BS's.  Lab Results  Component Value Date   HGBA1C 7.2 (H) 01/15/2017    Review of Systems  Constitutional: Positive for fatigue. Negative for activity change, appetite change, chills and fever.  HENT: Positive for congestion, postnasal drip and rhinorrhea. Negative for ear pain, facial swelling, mouth sores, sinus pain, sore throat, trouble swallowing and voice change.   Eyes: Negative for discharge, redness and itching.  Respiratory: Positive for cough, shortness of breath and wheezing. Negative for hemoptysis.   Cardiovascular: Negative for chest pain and palpitations.  Gastrointestinal: Positive for constipation and nausea. Negative for abdominal pain, blood in stool and heartburn.  Endocrine: Negative for polydipsia, polyphagia and polyuria.  Genitourinary: Negative for decreased urine volume, dysuria and hematuria.  Musculoskeletal: Positive for arthralgias and gait problem. Negative for joint swelling and neck pain.       Hx of OA and unstable gait.  Skin: Negative for rash.  Allergic/Immunologic: Positive for environmental allergies.  Neurological: Negative for weakness and headaches.  Hematological: Negative for adenopathy. Does not bruise/bleed easily.  Psychiatric/Behavioral: Negative for confusion. The patient is nervous/anxious.       Current Outpatient Medications on File Prior to Visit  Medication Sig Dispense Refill  . ACCU-CHEK AVIVA PLUS test strip TEST three times a day 300 each 3  . acetaminophen (TYLENOL) 325 MG tablet Take 650 mg by mouth every 6 (six) hours as needed for mild pain or moderate pain.    Marland Kitchen  albuterol (PROAIR HFA) 108 (90 Base) MCG/ACT inhaler inhale 2 puffs by mouth every 4 hours if needed USE ONLY IF YOU ARE WHEEZING 3 Inhaler 3  . amLODipine (NORVASC) 10 MG tablet Take 1 tablet (10 mg total) by mouth daily. 90 tablet 3  . Ascorbic Acid (VITAMIN  C PO) Take 1 tablet by mouth daily.    Marland Kitchen aspirin EC 81 MG tablet Take 162 mg by mouth daily.     Marland Kitchen atorvastatin (LIPITOR) 20 MG tablet Take 1 tablet (20 mg total) by mouth daily. 90 tablet 1  . B Complex Vitamins (VITAMIN B COMPLEX) TABS Take 1 tablet by mouth daily.    . Cholecalciferol (VITAMIN D3) 5000 UNITS CAPS Take 5,000 Units by mouth daily.    . Flaxseed, Linseed, (FLAXSEED OIL PO) Take 1 capsule by mouth daily.    . furosemide (LASIX) 20 MG tablet take 1 tablet by mouth once daily 90 tablet 1  . gabapentin (NEURONTIN) 300 MG capsule Take 1 capsule (300 mg total) by mouth 2 (two) times daily. 180 capsule 3  . guaiFENesin (MUCINEX) 600 MG 12 hr tablet Take 1 tablet (600 mg total) by mouth 2 (two) times daily. 20 tablet 0  . Insulin NPH, Human,, Isophane, (HUMULIN N KWIKPEN) 100 UNIT/ML Kiwkpen Inject 30 Units into the skin every morning. 15 mL 3  . Insulin Pen Needle (PEN NEEDLES 31GX5/16") 31G X 8 MM MISC Check Blood sugar three times per day. Dx insulin using E11.8 100 each 3  . levETIRAcetam (KEPPRA) 500 MG tablet Take 1 tablet (500 mg total) by mouth 2 (two) times daily. 60 tablet 11  . losartan (COZAAR) 25 MG tablet Take 25 mg by mouth daily.  0  . metFORMIN (GLUCOPHAGE) 500 MG tablet TAKE 1 TABLET BY MOUTH TWICE DAILY WITH A MEAL FOR DM II 60 tablet 0  . metoprolol succinate (TOPROL-XL) 50 MG 24 hr tablet TAKE ONE TABLET BY MOUTH ONCE DAILY WITH OR IMMEDIATELY FOLLOWING A MEAL 90 tablet 3  . metoprolol tartrate (LOPRESSOR) 50 MG tablet TAKE 1 TABLET BY MOUTH DAILY WITH OR IMMEDIATELY FOLLOWING A MEAL FOR HTN 30 tablet 0  . Multiple Vitamin (MULTIVITAMIN WITH MINERALS) TABS tablet Take 1 tablet by mouth daily.    . nitroGLYCERIN (NITROSTAT) 0.4 MG SL tablet Place 1 tablet (0.4 mg total) under the tongue every 5 (five) minutes as needed for chest pain. 20 tablet 12  . pantoprazole (PROTONIX) 40 MG tablet Take 1 tablet (40 mg total) by mouth daily. 90 tablet 3  . psyllium (METAMUCIL  SMOOTH TEXTURE) 28 % packet Take 1 packet by mouth 2 (two) times daily. 30 packet 12   No current facility-administered medications on file prior to visit.      Past Medical History:  Diagnosis Date  . Acute delirium 01/14/2017  . Anxiety   . Arthritis    "all over"  . Asthma   . Bipolar disorder ([place])   . Complication of anesthesia    "w/right foot OR they gave me too much and they couldn't get me woke"  . Congestive heart failure, unspecified   . Coronary artery calcification seen on CAT scan 01/14/2017  . Coronary artery disease    "I've got 1 stent" (06/30/2015)  . Depression   . Discoid lupus   . Fibromyalgia    "I've been told I have this; can't take Lyrica cause I'm allergic to it" (06/30/2015)  . GERD (gastroesophageal reflux disease)   . History of gout   .  History of hiatal hernia    "real bad" (06/30/2015)  . Hypertension   . Hypothyroidism   . On home oxygen therapy    "2L q hs" (06/30/2015)  . OSA (obstructive sleep apnea)    "just use my oxygen; no mask" (06/30/2015)  . Other and unspecified hyperlipidemia   . Penetrating foot wound    left nonhealing foot wound on the dorsal surface  . Peripheral neuropathy   . Pneumonia "many of times"  . Refusal of blood transfusions as patient is Jehovah's Witness   . Renal failure, unspecified    "kidneys not working 100%" (06/30/2015)  . Sickle cell trait ([place])   . Systemic lupus ([place])   . Thoracic aortic atherosclerosis ([place]) 01/14/2017  . Type II diabetes mellitus ([place])   . Unspecified vitamin D deficiency    Allergies  Allergen Reactions  . Ace Inhibitors Other (See Comments)    Coincided with sig bump in creat. Retried and creat bumped again.  Beulah Gandy [Isosorb Dinitrate-Hydralazine] Other (See Comments)    Sleep all the time.  Ebbie Ridge [Buspirone] Other (See Comments)    pain  . Pregabalin Swelling  . Ropinirole Hydrochloride Swelling  . Amantadines Rash  . Penicillins Swelling and Rash    Has patient  had a PCN reaction causing immediate rash, facial/tongue/throat swelling, SOB or lightheadedness with hypotension: Yes Has patient had a PCN reaction causing severe rash involving mucus membranes or skin necrosis: Yes Has patient had a PCN reaction that required hospitalization: Yes Has patient had a PCN reaction occurring within the last 10 years: No If all of the above answers are "NO", then may proceed with Cephalosporin use.     Social History   Socioeconomic History  . Marital status: Single    Spouse name: None  . Number of children: 4  . Years of education: 9 th  . Highest education level: None  Social Needs  . Financial resource strain: None  . Food insecurity - worry: None  . Food insecurity - inability: None  . Transportation needs - medical: None  . Transportation needs - non-medical: None  Occupational History  . Occupation: Retired- Engineer, production    Employer: RETIRED  Tobacco Use  . Smoking status: Former Smoker    Packs/day: 3.00    Years: 40.00    Pack years: 120.00    Types: Cigarettes    Start date: 07/05/1960    Last attempt to quit: 07/05/1998    Years since quitting: 18.9  . Smokeless tobacco: Never Used  Substance and Sexual Activity  . Alcohol use: No    Alcohol/week: 0.0 oz    Comment: 06/30/2015 "used to drink a little alcohol on the weekends; not q weekend; nothing in years"  . Drug use: No  . Sexual activity: No  Other Topics Concern  . None  Social History Narrative   Health Care POA:    Emergency Contact: daughter, Lorenso Courier [phone] or [name] [phone]   End of Life Plan: Does not want to be ventilated or feeding tubes.    Who lives with you: Lives alone   Any pets: none   Diet: Patient reports enjoying and eating junk food.  Does not regulate types of food and currently her dentures are broken so it is hard to eat some foods.   Exercise: Patient does not have an exercise plan.   Seatbelts: Patient reports wearing  seatbelt when in vehicle.   Sun Exposure/Protection: Patient reports not going outside  very often.   Hobbies: Patient enjoys reading the bible and watching game shows.    Right-handed.   1 cup caffeine per day.   Former smoker-stopped 2000   Alcohol none    Vitals:   05/25/17 1040  BP: 124/84  Pulse: 79  Resp: 16  Temp: 98 F (36.7 C)  SpO2: 97%   Body mass index is 47.01 kg/m.   Physical Exam  Nursing note and vitals reviewed. Constitutional: She is oriented to person, place, and time. She appears well-developed. She does not appear ill. No distress.  HENT:  Head: Normocephalic and atraumatic.  Nose: Right sinus exhibits no maxillary sinus tenderness and no frontal sinus tenderness. Left sinus exhibits no maxillary sinus tenderness and no frontal sinus tenderness.  Mouth/Throat: Oropharynx is clear and moist. Mucous membranes are dry.  Edentulous. Post nasal drainage  Eyes: Conjunctivae are normal. No scleral icterus.  Cardiovascular: Normal rate and regular rhythm.  Occasional extrasystoles are present.  No murmur heard. Respiratory: Effort normal. No respiratory distress. She has no wheezes. She has no rales.  GI: Soft. She exhibits no mass. There is no tenderness.  Musculoskeletal: She exhibits edema (Trace pitting LE edema,bilateral).  Lymphadenopathy:    She has no cervical adenopathy.  Neurological: She is alert and oriented to person, place, and time. She has normal strength.  Stable gait assisted with a walker.  Skin: Skin is warm. No rash noted. No erythema.  Psychiatric: Her mood appears anxious.  Well groomed, good eye contact.    ASSESSMENT AND PLAN:   Brittany Roberts was seen today for cough.  Diagnoses and all orders for this visit:  Lab Results  Component Value Date   WBC 9.9 05/25/2017   HGB 11.5 (L) 05/25/2017   HCT 35.4 (L) 05/25/2017   MCV 74.0 (L) 05/25/2017   PLT 278.0 05/25/2017   Lab Results  Component Value Date   CREATININE 1.76 (H)  05/25/2017   BUN 39 (H) 05/25/2017   NA 137 05/25/2017   K 4.3 05/25/2017   CL 103 05/25/2017   CO2 25 05/25/2017    Exertional dyspnea  She has reported SOB during prior visits and thought to be chronic. Possible causes discussed: Respiratory infection,GERD, CHF, diconditioning, COPD/asthma among some. Cardiac cath 06/2015 (Dr Einar Gip). Echo 04/2017 severe hypokinesis RCA territory, LVEF 70%, grade I diastolic dysfunction.  Instructed about warning signs and further recommendations will be given according to CXR and lab results.  -     DG Chest 2 View; Future -     Basic metabolic panel  Chronic obstructive pulmonary disease with acute exacerbation (HCC)  Former smoker, also Hx of asthma. Albuterol inh 2 puff every 6 hours for a week then as needed for wheezing or shortness of breath.  Hx of DM II and no wheezing today,so will hold on oral Prednisone for now. Stop Flovent. Start Symbicort, which she has used before. Instructed about warning signs. F/U in 2 weeks,before if needed.  -     DG Chest 2 View; Future -     CBC with Differential/Platelet -     budesonide-formoterol (SYMBICORT) 160-4.5 MCG/ACT inhaler; Inhale 2 puffs into the lungs 2 (two) times daily.  Constipation, unspecified constipation type  Chronic problem. Adequate fiber and fluid intake. Daily Metamucil  Since this seems to help. Instructed about warning signs.  -     Basic metabolic panel  Cough  Because 4 weeks of worsening cough CXR was ordered. She has reported cough during prior  visits, mainly in the morning and with whitish "mucus" in the morning mainly.  ? GERD,COPD,CHF among some to consider. COPD treatment changed today. GERD precautions discussed, no changes in Protonix. Continue following with cardiologist.    -Brittany Roberts was advised to seek attention immediately if symptoms suddenly worsened.       Hanley Woerner G. [name], MD  [facility]. [place]  office.

## 2017-05-31 ENCOUNTER — Telehealth: Payer: Self-pay | Admitting: Family Medicine

## 2017-05-31 ENCOUNTER — Ambulatory Visit (INDEPENDENT_AMBULATORY_CARE_PROVIDER_SITE_OTHER)
Admission: RE | Admit: 2017-05-31 | Discharge: 2017-05-31 | Disposition: A | Discharge status: Home / Self Care | Payer: Medicare Other | Source: Ambulatory Visit | Attending: Family Medicine | Admitting: Family Medicine

## 2017-05-31 DIAGNOSIS — R0600 Dyspnea, unspecified: Secondary | ICD-10-CM

## 2017-05-31 DIAGNOSIS — I10 Essential (primary) hypertension: Secondary | ICD-10-CM

## 2017-05-31 DIAGNOSIS — R0609 Other forms of dyspnea: Secondary | ICD-10-CM

## 2017-05-31 DIAGNOSIS — J441 Chronic obstructive pulmonary disease with (acute) exacerbation: Secondary | ICD-10-CM | POA: Diagnosis not present

## 2017-05-31 DIAGNOSIS — R05 Cough: Secondary | ICD-10-CM | POA: Diagnosis not present

## 2017-05-31 NOTE — Telephone Encounter (Signed)
Can you please call pharmacy to find out what is the problem. Thanks, BJ

## 2017-05-31 NOTE — Telephone Encounter (Signed)
Copied from [place] [id number]. Topic: Quick Communication - See Telephone Encounter >> May 31, 2017  3:14 PM Ether Griffins B wrote: CRM for notification. See Telephone encounter for:  Pt needing gabapentin refilled. But [facility] is saying they need to speak with the provider before they can fill it. Pt has been trying to get it filled going on the second week now 05/31/17.

## 2017-06-01 NOTE — Telephone Encounter (Signed)
Spoke with pharmacy, Rx has been refilled.

## 2017-06-10 ENCOUNTER — Ambulatory Visit: Payer: Medicare Other | Admitting: Family Medicine

## 2017-06-13 DIAGNOSIS — J45909 Unspecified asthma, uncomplicated: Secondary | ICD-10-CM | POA: Diagnosis not present

## 2017-06-15 ENCOUNTER — Ambulatory Visit: Payer: Medicare Other | Admitting: Family Medicine

## 2017-06-16 ENCOUNTER — Other Ambulatory Visit: Payer: Self-pay

## 2017-06-16 ENCOUNTER — Other Ambulatory Visit: Payer: Self-pay | Admitting: Family Medicine

## 2017-06-16 DIAGNOSIS — I10 Essential (primary) hypertension: Secondary | ICD-10-CM

## 2017-06-16 DIAGNOSIS — E1149 Type 2 diabetes mellitus with other diabetic neurological complication: Secondary | ICD-10-CM

## 2017-06-16 NOTE — Telephone Encounter (Signed)
Pt called and updated

## 2017-06-16 NOTE — Telephone Encounter (Signed)
Copied from [place]. Topic: Quick Communication - Rx Refill/Question >> Jun 16, 2017 11:13 AM Carolyn Stare wrote:  Has the patient contacted their pharmacy yes      amLODipine (NORVASC) 10 MG tablet  [place]

## 2017-06-16 NOTE — Telephone Encounter (Signed)
OV 02/15/17 Pharmacy changed to [facility] for amlodipine

## 2017-07-13 DIAGNOSIS — Z9861 Coronary angioplasty status: Secondary | ICD-10-CM | POA: Diagnosis not present

## 2017-07-13 DIAGNOSIS — I10 Essential (primary) hypertension: Secondary | ICD-10-CM | POA: Diagnosis not present

## 2017-07-13 DIAGNOSIS — I428 Other cardiomyopathies: Secondary | ICD-10-CM | POA: Diagnosis not present

## 2017-07-13 DIAGNOSIS — I2510 Atherosclerotic heart disease of native coronary artery without angina pectoris: Secondary | ICD-10-CM | POA: Diagnosis not present

## 2017-07-14 DIAGNOSIS — J45909 Unspecified asthma, uncomplicated: Secondary | ICD-10-CM | POA: Diagnosis not present

## 2017-07-14 NOTE — Progress Notes (Deleted)
HPI:   [name] is a 77 y.o. female, who is here today to follow on recent OV, 05/25/17.     Review of Systems    Current Outpatient Medications on File Prior to Visit  Medication Sig Dispense Refill  . ACCU-CHEK AVIVA PLUS test strip TEST three times a day 300 each 3  . acetaminophen (TYLENOL) 325 MG tablet Take 650 mg by mouth every 6 (six) hours as needed for mild pain or moderate pain.    Marland Kitchen albuterol (PROAIR HFA) 108 (90 Base) MCG/ACT inhaler inhale 2 puffs by mouth every 4 hours if needed USE ONLY IF YOU ARE WHEEZING 3 Inhaler 3  . amLODipine (NORVASC) 10 MG tablet Take 1 tablet (10 mg total) by mouth daily. 90 tablet 3  . Ascorbic Acid (VITAMIN C PO) Take 1 tablet by mouth daily.    Marland Kitchen aspirin EC 81 MG tablet Take 162 mg by mouth daily.     Marland Kitchen atorvastatin (LIPITOR) 20 MG tablet Take 1 tablet (20 mg total) by mouth daily. 90 tablet 1  . B Complex Vitamins (VITAMIN B COMPLEX) TABS Take 1 tablet by mouth daily.    . budesonide-formoterol (SYMBICORT) 160-4.5 MCG/ACT inhaler Inhale 2 puffs into the lungs 2 (two) times daily. 1 Inhaler 3  . Cholecalciferol (VITAMIN D3) 5000 UNITS CAPS Take 5,000 Units by mouth daily.    . Flaxseed, Linseed, (FLAXSEED OIL PO) Take 1 capsule by mouth daily.    . furosemide (LASIX) 20 MG tablet take 1 tablet by mouth once daily 90 tablet 1  . gabapentin (NEURONTIN) 300 MG capsule Take 1 capsule (300 mg total) by mouth 2 (two) times daily. 180 capsule 3  . guaiFENesin (MUCINEX) 600 MG 12 hr tablet Take 1 tablet (600 mg total) by mouth 2 (two) times daily. 20 tablet 0  . Insulin NPH, Human,, Isophane, (HUMULIN N KWIKPEN) 100 UNIT/ML Kiwkpen Inject 30 Units into the skin every morning. 15 mL 3  . Insulin Pen Needle (PEN NEEDLES 31GX5/16") 31G X 8 MM MISC Check Blood sugar three times per day. Dx insulin using E11.8 100 each 3  . levETIRAcetam (KEPPRA) 500 MG tablet Take 1 tablet (500 mg total) by mouth 2 (two) times daily. 60 tablet 11  .  losartan (COZAAR) 25 MG tablet Take 25 mg by mouth daily.  0  . metFORMIN (GLUCOPHAGE) 500 MG tablet take 1 tablet by mouth twice a day with meals for diabetes 60 tablet 0  . metoprolol succinate (TOPROL-XL) 50 MG 24 hr tablet TAKE ONE TABLET BY MOUTH ONCE DAILY WITH OR IMMEDIATELY FOLLOWING A MEAL 90 tablet 3  . metoprolol tartrate (LOPRESSOR) 50 MG tablet take 1 tablet by mouth once daily WITH OR IMMEDIATELY FOLLOWING A MEAL for hypertension 30 tablet 0  . Multiple Vitamin (MULTIVITAMIN WITH MINERALS) TABS tablet Take 1 tablet by mouth daily.    . nitroGLYCERIN (NITROSTAT) 0.4 MG SL tablet Place 1 tablet (0.4 mg total) under the tongue every 5 (five) minutes as needed for chest pain. 20 tablet 12  . pantoprazole (PROTONIX) 40 MG tablet Take 1 tablet (40 mg total) by mouth daily. 90 tablet 3  . psyllium (METAMUCIL SMOOTH TEXTURE) 28 % packet Take 1 packet by mouth 2 (two) times daily. 30 packet 12   No current facility-administered medications on file prior to visit.      Past Medical History:  Diagnosis Date  . Acute delirium 01/14/2017  . Anxiety   . Arthritis    "  all over"  . Asthma   . Bipolar disorder ([place])   . Complication of anesthesia    "w/right foot OR they gave me too much and they couldn't get me woke"  . Congestive heart failure, unspecified   . Coronary artery calcification seen on CAT scan 01/14/2017  . Coronary artery disease    "I've got 1 stent" (06/30/2015)  . Depression   . Discoid lupus   . Fibromyalgia    "I've been told I have this; can't take Lyrica cause I'm allergic to it" (06/30/2015)  . GERD (gastroesophageal reflux disease)   . History of gout   . History of hiatal hernia    "real bad" (06/30/2015)  . Hypertension   . Hypothyroidism   . On home oxygen therapy    "2L q hs" (06/30/2015)  . OSA (obstructive sleep apnea)    "just use my oxygen; no mask" (06/30/2015)  . Other and unspecified hyperlipidemia   . Penetrating foot wound    left nonhealing  foot wound on the dorsal surface  . Peripheral neuropathy   . Pneumonia "many of times"  . Refusal of blood transfusions as patient is Jehovah's Witness   . Renal failure, unspecified    "kidneys not working 100%" (06/30/2015)  . Sickle cell trait ([place])   . Systemic lupus ([place])   . Thoracic aortic atherosclerosis ([place]) 01/14/2017  . Type II diabetes mellitus ([place])   . Unspecified vitamin D deficiency    Allergies  Allergen Reactions  . Ace Inhibitors Other (See Comments)    Coincided with sig bump in creat. Retried and creat bumped again.  Beulah Gandy [Isosorb Dinitrate-Hydralazine] Other (See Comments)    Sleep all the time.  Ebbie Ridge [Buspirone] Other (See Comments)    pain  . Pregabalin Swelling  . Ropinirole Hydrochloride Swelling  . Amantadines Rash  . Penicillins Swelling and Rash    Has patient had a PCN reaction causing immediate rash, facial/tongue/throat swelling, SOB or lightheadedness with hypotension: Yes Has patient had a PCN reaction causing severe rash involving mucus membranes or skin necrosis: Yes Has patient had a PCN reaction that required hospitalization: Yes Has patient had a PCN reaction occurring within the last 10 years: No If all of the above answers are "NO", then may proceed with Cephalosporin use.     Social History   Socioeconomic History  . Marital status: Single    Spouse name: Not on file  . Number of children: 4  . Years of education: 28 th  . Highest education level: Not on file  Social Needs  . Financial resource strain: Not on file  . Food insecurity - worry: Not on file  . Food insecurity - inability: Not on file  . Transportation needs - medical: Not on file  . Transportation needs - non-medical: Not on file  Occupational History  . Occupation: Retired- Engineer, production    Employer: RETIRED  Tobacco Use  . Smoking status: Former Smoker    Packs/day: 3.00    Years: 40.00    Pack years: 120.00    Types: Cigarettes     Start date: 07/05/1960    Last attempt to quit: 07/05/1998    Years since quitting: 19.0  . Smokeless tobacco: Never Used  Substance and Sexual Activity  . Alcohol use: No    Alcohol/week: 0.0 oz    Comment: 06/30/2015 "used to drink a little alcohol on the weekends; not q weekend; nothing in years"  . Drug  use: No  . Sexual activity: No  Other Topics Concern  . Not on file  Social History Narrative   Health Care POA:    Emergency Contact: daughter, Lorenso Courier [phone] or [name] [phone]   End of Life Plan: Does not want to be ventilated or feeding tubes.    Who lives with you: Lives alone   Any pets: none   Diet: Patient reports enjoying and eating junk food.  Does not regulate types of food and currently her dentures are broken so it is hard to eat some foods.   Exercise: Patient does not have an exercise plan.   Seatbelts: Patient reports wearing seatbelt when in vehicle.   Sun Exposure/Protection: Patient reports not going outside very often.   Hobbies: Patient enjoys reading the bible and watching game shows.    Right-handed.   1 cup caffeine per day.   Former smoker-stopped 2000   Alcohol none    There were no vitals filed for this visit. There is no height or weight on file to calculate BMI.      Physical Exam    ASSESSMENT AND PLAN:     There are no diagnoses linked to this encounter.             [name]. [name], MD  [facility]. [place] office.

## 2017-07-15 ENCOUNTER — Telehealth: Payer: Self-pay | Admitting: Family Medicine

## 2017-07-15 ENCOUNTER — Other Ambulatory Visit: Payer: Self-pay | Admitting: *Deleted

## 2017-07-15 ENCOUNTER — Ambulatory Visit: Payer: Medicare Other | Admitting: Family Medicine

## 2017-07-15 ENCOUNTER — Other Ambulatory Visit: Payer: Self-pay | Admitting: Family Medicine

## 2017-07-15 MED ORDER — INSULIN ISOPHANE HUMAN 100 UNIT/ML KWIKPEN: 30.0000 [IU] | PEN_INJECTOR | Freq: Every morning | SUBCUTANEOUS | 0 refills | Status: DC

## 2017-07-15 MED ORDER — "PEN NEEDLES 5/16"" 31G X 8 MM MISC": 3 refills | Status: DC

## 2017-07-15 NOTE — Telephone Encounter (Signed)
Copied from [place] [id number]. Topic: Quick Communication - Rx Refill/Question >> Jul 15, 2017 11:09 AM Oliver Pila B wrote: Medication:  Insulin NPH, Human,, Isophane, (HUMULIN N KWIKPEN) 100 UNIT/ML Mayer Masker [[id number]] and Insulin Pen Needle (PEN NEEDLES 31GX5/16") 31G X 8 MM MISC [[id number]]    Has the patient contacted their pharmacy?  No  (Agent: If no, request that the patient contact the pharmacy for the refill.)   Preferred Pharmacy (with phone number or street name): [facility] (the same pharmacy on [address] [place])   Agent: Please be advised that RX refills may take up to 3 business days. We ask that you follow-up with your pharmacy.

## 2017-07-15 NOTE — Telephone Encounter (Signed)
Rx's sent. She needs appt for follow up on DM II.  Thanks, BJ

## 2017-07-15 NOTE — Telephone Encounter (Signed)
Patient rescheduled for 07/19/17.

## 2017-07-18 ENCOUNTER — Other Ambulatory Visit: Payer: Self-pay | Admitting: Family Medicine

## 2017-07-18 DIAGNOSIS — E1149 Type 2 diabetes mellitus with other diabetic neurological complication: Secondary | ICD-10-CM

## 2017-07-19 ENCOUNTER — Ambulatory Visit (INDEPENDENT_AMBULATORY_CARE_PROVIDER_SITE_OTHER): Payer: Medicare Other | Admitting: Family Medicine

## 2017-07-19 ENCOUNTER — Encounter: Payer: Self-pay | Admitting: Family Medicine

## 2017-07-19 VITALS — BP 130/82 | HR 75 | Temp 98.2°F | Resp 16 | Ht 63.0 in | Wt 273.1 lb

## 2017-07-19 DIAGNOSIS — E1149 Type 2 diabetes mellitus with other diabetic neurological complication: Secondary | ICD-10-CM

## 2017-07-19 DIAGNOSIS — R159 Full incontinence of feces: Secondary | ICD-10-CM

## 2017-07-19 DIAGNOSIS — K5900 Constipation, unspecified: Secondary | ICD-10-CM | POA: Diagnosis not present

## 2017-07-19 DIAGNOSIS — J309 Allergic rhinitis, unspecified: Secondary | ICD-10-CM

## 2017-07-19 DIAGNOSIS — J449 Chronic obstructive pulmonary disease, unspecified: Secondary | ICD-10-CM | POA: Diagnosis not present

## 2017-07-19 DIAGNOSIS — M109 Gout, unspecified: Secondary | ICD-10-CM

## 2017-07-19 LAB — BASIC METABOLIC PANEL
BUN: 19 mg/dL (ref 6–23)
CO2: 29 mEq/L (ref 19–32)
Calcium: 9.1 mg/dL (ref 8.4–10.5)
Chloride: 104 mEq/L (ref 96–112)
Creatinine, Ser: 1.41 mg/dL — ABNORMAL HIGH (ref 0.40–1.20)
GFR: 46.42 mL/min — ABNORMAL LOW (ref >60.00)
Glucose, Bld: 144 mg/dL — ABNORMAL HIGH (ref 70–99)
Potassium: 4.3 mEq/L (ref 3.5–5.1)
Sodium: 141 mEq/L (ref 135–145)

## 2017-07-19 LAB — URIC ACID: Uric Acid, Serum: 10.2 mg/dL — ABNORMAL HIGH (ref 2.4–7.0)

## 2017-07-19 LAB — HEMOGLOBIN A1C: Hgb A1c MFr Bld: 6.5 % (ref 4.6–6.5)

## 2017-07-19 MED ORDER — LINACLOTIDE 145 MCG PO CAPS: 145.0000 ug | ORAL_CAPSULE | Freq: Every day | ORAL | 1 refills | Status: DC

## 2017-07-19 MED ORDER — MONTELUKAST SODIUM 10 MG PO TABS: 10.0000 mg | ORAL_TABLET | Freq: Every day | ORAL | 0 refills | Status: DC

## 2017-07-19 NOTE — Patient Instructions (Signed)
A few things to remember from today's visit:   Chronic obstructive pulmonary disease, unspecified COPD type ([place])  DM (diabetes mellitus), type 2 with neurological complications ([place]) - Plan: Basic metabolic panel, Hemoglobin A1c, Fructosamine  Allergic rhinitis, unspecified seasonality, unspecified trigger - Plan: montelukast (SINGULAIR) 10 MG tablet  Fibromyalgia  Incontinence of feces, unspecified fecal incontinence type  Gouty arthritis of great toe - Plan: Uric acid  Constipation, unspecified constipation type - Plan: linaclotide (LINZESS) 145 MCG CAPS capsule  Singulair and Linzess added today.   Please be sure medication list is accurate. If a new problem present, please set up appointment sooner than planned today.

## 2017-07-19 NOTE — Progress Notes (Signed)
HPI:   [name] is a 77 y.o. female, who is here today to follow on recent OV, 05/25/17.  Last office visit she was complaining of persistent productive cough and exertional dyspnea. She has history of dyspnea. History of CHF, she follows with Dr. Einar Gip, cardiologist. Asthma/COPD She is on supplemental oxygen during the night 2 LPM..  Cough has improved. Dyspnea back to her baseline. Last visit Symbicort 160-4.5 mcg was recommended.  She denies chills or fever.  She still complaining of rhinorrhea and postnasal drainage, worse first thing in the morning when she wakes up.  She feels better after coughing up mucus and blowing her nose. Symptoms are intermittent during the day. She denies hemoptysis. This problem has been going on for years" otherwise stable.  Today she is also complaining of having "no control on my bowel movements" Smells feces and when she checks she notices small amount of stool. She wears pull ups.  Problem started 3-4 weeks ago.  Hx of Constipation. Small "pieces" at the time. No blood in stools Miralax and Dulcolax do not help. Colonoscopy in 12/2013.   Mammogram: Requesting mammogram. Last one on 04/26/16: Birads 1 Right breast pain for over a year. No nipple discharge now but she did noted one time a few months ago, clear.  Exacerbated by rubbing area with arm and when bending down. No rash or skin changes. Soreness, 5-6/10.  She was having pain when she had her last mammogram. According to pt, she met with provider after mammogram and "her did not fine anything."  Right great toe pain 2-3 times per week, reporting Hx of gout.  She has not noted edema or erythema. No history of trauma. History of fibromyalgia, "pain all over."  She is currently on Furosemide 20 mg daily.   Lab Results  Component Value Date   TSH 0.63 04/08/2017   DM 2: Last A1c in 01/2017 was 7.2. Denies abdominal pain, nausea,vomiting,  polydipsia,polyuria, or polyphagia.  She is currently on Humulin N 30 units daily on Metformin 500 mg twice daily.  She denies episodes of hypoglycemia.    Review of Systems  Constitutional: Positive for fatigue (No more than usual.). Negative for activity change, appetite change, fever and unexpected weight change.  HENT: Positive for postnasal drip and rhinorrhea. Negative for mouth sores, nosebleeds and trouble swallowing.   Eyes: Negative for redness and visual disturbance.  Respiratory: Positive for cough (improved,chronic) and shortness of breath (at her baseline.). Negative for wheezing.   Cardiovascular: Negative for chest pain, palpitations and leg swelling.  Gastrointestinal: Positive for constipation. Negative for abdominal pain, nausea and vomiting.       Negative for changes in bowel habits.  Endocrine: Negative for cold intolerance, heat intolerance, polydipsia, polyphagia and polyuria.  Genitourinary: Negative for decreased urine volume, dysuria and hematuria.  Musculoskeletal: Positive for arthralgias, gait problem and myalgias.  Skin: Negative for rash and wound.  Allergic/Immunologic: Positive for environmental allergies.  Neurological: Negative for syncope, weakness and headaches.  Psychiatric/Behavioral: Negative for confusion. The patient is nervous/anxious.       Current Outpatient Medications on File Prior to Visit  Medication Sig Dispense Refill  . ACCU-CHEK AVIVA PLUS test strip TEST three times a day 300 each 3  . acetaminophen (TYLENOL) 325 MG tablet Take 650 mg by mouth every 6 (six) hours as needed for mild pain or moderate pain.    Marland Kitchen albuterol (PROAIR HFA) 108 (90 Base) MCG/ACT inhaler inhale 2 puffs  by mouth every 4 hours if needed USE ONLY IF YOU ARE WHEEZING 3 Inhaler 3  . amLODipine (NORVASC) 10 MG tablet Take 1 tablet (10 mg total) by mouth daily. 90 tablet 3  . Ascorbic Acid (VITAMIN C PO) Take 1 tablet by mouth daily.    Marland Kitchen aspirin EC 81 MG tablet  Take 162 mg by mouth daily.     Marland Kitchen atorvastatin (LIPITOR) 20 MG tablet Take 1 tablet (20 mg total) by mouth daily. 90 tablet 1  . B Complex Vitamins (VITAMIN B COMPLEX) TABS Take 1 tablet by mouth daily.    . budesonide-formoterol (SYMBICORT) 160-4.5 MCG/ACT inhaler Inhale 2 puffs into the lungs 2 (two) times daily. 1 Inhaler 3  . Cholecalciferol (VITAMIN D3) 5000 UNITS CAPS Take 5,000 Units by mouth daily.    . Flaxseed, Linseed, (FLAXSEED OIL PO) Take 1 capsule by mouth daily.    . furosemide (LASIX) 20 MG tablet take 1 tablet by mouth once daily 90 tablet 1  . gabapentin (NEURONTIN) 300 MG capsule Take 1 capsule (300 mg total) by mouth 2 (two) times daily. 180 capsule 3  . guaiFENesin (MUCINEX) 600 MG 12 hr tablet Take 1 tablet (600 mg total) by mouth 2 (two) times daily. 20 tablet 0  . Insulin NPH, Human,, Isophane, (HUMULIN N KWIKPEN) 100 UNIT/ML Kiwkpen Inject 30 Units into the skin every morning. 15 mL 0  . Insulin Pen Needle (PEN NEEDLES 31GX5/16") 31G X 8 MM MISC Check Blood sugar three times per day. Dx insulin using E11.8 100 each 3  . levETIRAcetam (KEPPRA) 500 MG tablet Take 1 tablet (500 mg total) by mouth 2 (two) times daily. 60 tablet 11  . losartan (COZAAR) 25 MG tablet Take 25 mg by mouth daily.  0  . metFORMIN (GLUCOPHAGE) 500 MG tablet TAKE 1 TABLET TWICE DAILY WITH MEALS FOR DIABETES. 60 tablet 0  . metoprolol succinate (TOPROL-XL) 50 MG 24 hr tablet TAKE ONE TABLET BY MOUTH ONCE DAILY WITH OR IMMEDIATELY FOLLOWING A MEAL 90 tablet 3  . metoprolol tartrate (LOPRESSOR) 50 MG tablet TAKE 1 TABLET BY MOUTH DAILY WITH OR IMMEDIATELY FOLLOWING A MEAL FOR HYPERTENSION 30 tablet 0  . Multiple Vitamin (MULTIVITAMIN WITH MINERALS) TABS tablet Take 1 tablet by mouth daily.    . nitroGLYCERIN (NITROSTAT) 0.4 MG SL tablet Place 1 tablet (0.4 mg total) under the tongue every 5 (five) minutes as needed for chest pain. 20 tablet 12  . pantoprazole (PROTONIX) 40 MG tablet Take 1 tablet (40 mg  total) by mouth daily. 90 tablet 3  . psyllium (METAMUCIL SMOOTH TEXTURE) 28 % packet Take 1 packet by mouth 2 (two) times daily. 30 packet 12   No current facility-administered medications on file prior to visit.      Past Medical History:  Diagnosis Date  . Acute delirium 01/14/2017  . Anxiety   . Arthritis    "all over"  . Asthma   . Bipolar disorder ([place])   . Complication of anesthesia    "w/right foot OR they gave me too much and they couldn't get me woke"  . Congestive heart failure, unspecified   . Coronary artery calcification seen on CAT scan 01/14/2017  . Coronary artery disease    "I've got 1 stent" (06/30/2015)  . Depression   . Discoid lupus   . Fibromyalgia    "I've been told I have this; can't take Lyrica cause I'm allergic to it" (06/30/2015)  . GERD (gastroesophageal reflux disease)   .  History of gout   . History of hiatal hernia    "real bad" (06/30/2015)  . Hypertension   . Hypothyroidism   . On home oxygen therapy    "2L q hs" (06/30/2015)  . OSA (obstructive sleep apnea)    "just use my oxygen; no mask" (06/30/2015)  . Other and unspecified hyperlipidemia   . Penetrating foot wound    left nonhealing foot wound on the dorsal surface  . Peripheral neuropathy   . Pneumonia "many of times"  . Refusal of blood transfusions as patient is Jehovah's Witness   . Renal failure, unspecified    "kidneys not working 100%" (06/30/2015)  . Sickle cell trait ([place])   . Systemic lupus ([place])   . Thoracic aortic atherosclerosis ([place]) 01/14/2017  . Type II diabetes mellitus ([place])   . Unspecified vitamin D deficiency    Allergies  Allergen Reactions  . Ace Inhibitors Other (See Comments)    Coincided with sig bump in creat. Retried and creat bumped again.  Beulah Gandy [Isosorb Dinitrate-Hydralazine] Other (See Comments)    Sleep all the time.  Ebbie Ridge [Buspirone] Other (See Comments)    pain  . Pregabalin Swelling  . Ropinirole Hydrochloride Swelling  .  Amantadines Rash  . Penicillins Swelling and Rash    Has patient had a PCN reaction causing immediate rash, facial/tongue/throat swelling, SOB or lightheadedness with hypotension: Yes Has patient had a PCN reaction causing severe rash involving mucus membranes or skin necrosis: Yes Has patient had a PCN reaction that required hospitalization: Yes Has patient had a PCN reaction occurring within the last 10 years: No If all of the above answers are "NO", then may proceed with Cephalosporin use.     Social History   Socioeconomic History  . Marital status: Single    Spouse name: None  . Number of children: 4  . Years of education: 85 th  . Highest education level: None  Social Needs  . Financial resource strain: None  . Food insecurity - worry: None  . Food insecurity - inability: None  . Transportation needs - medical: None  . Transportation needs - non-medical: None  Occupational History  . Occupation: Retired- Engineer, production    Employer: RETIRED  Tobacco Use  . Smoking status: Former Smoker    Packs/day: 3.00    Years: 40.00    Pack years: 120.00    Types: Cigarettes    Start date: 07/05/1960    Last attempt to quit: 07/05/1998    Years since quitting: 19.0  . Smokeless tobacco: Never Used  Substance and Sexual Activity  . Alcohol use: No    Alcohol/week: 0.0 oz    Comment: 06/30/2015 "used to drink a little alcohol on the weekends; not q weekend; nothing in years"  . Drug use: No  . Sexual activity: No  Other Topics Concern  . None  Social History Narrative   Health Care POA:    Emergency Contact: daughter, Lorenso Courier [phone] or [name] [phone]   End of Life Plan: Does not want to be ventilated or feeding tubes.    Who lives with you: Lives alone   Any pets: none   Diet: Patient reports enjoying and eating junk food.  Does not regulate types of food and currently her dentures are broken so it is hard to eat some foods.   Exercise: Patient does not  have an exercise plan.   Seatbelts: Patient reports wearing seatbelt when in vehicle.   Nancy Fetter  Exposure/Protection: Patient reports not going outside very often.   Hobbies: Patient enjoys reading the bible and watching game shows.    Right-handed.   1 cup caffeine per day.   Former smoker-stopped 2000   Alcohol none    Vitals:   07/19/17 0902  BP: 130/82  Pulse: 75  Resp: 16  Temp: 98.2 F (36.8 C)  SpO2: 95%   Body mass index is 48.38 kg/m.   Physical Exam  Nursing note and vitals reviewed. Constitutional: She is oriented to person, place, and time. She appears well-developed. No distress.  HENT:  Head: Normocephalic and atraumatic.  Nose: Right sinus exhibits no maxillary sinus tenderness and no frontal sinus tenderness. Left sinus exhibits no maxillary sinus tenderness and no frontal sinus tenderness.  Mouth/Throat: Oropharynx is clear and moist and mucous membranes are normal.  Edentulous.  Eyes: Conjunctivae are normal. Pupils are equal, round, and reactive to light.  Cardiovascular: Normal rate and regular rhythm.  No murmur heard. Respiratory: Effort normal. No respiratory distress. She has wheezes.  GI: Soft. She exhibits no mass. There is no tenderness.  Musculoskeletal: She exhibits edema (Trace pitting edema LE, bilateral). She exhibits no tenderness.  Tenderness upon palpation of right MTP joint, no erythema or edema appreciated.  Lymphadenopathy:    She has no cervical adenopathy.  Neurological: She is alert and oriented to person, place, and time. She has normal strength.  Stable gait assisted with a walker  Skin: Skin is warm. No rash noted. No erythema.  Psychiatric: Her mood appears anxious.  Well groomed, good eye contact.      ASSESSMENT AND PLAN:   Ms. Mariachristina was seen today for follow-up.  Diagnoses and all orders for this visit:  DM (diabetes mellitus), type 2 with neurological complications ([place])  [id number] pending today. No changes in current  management. Regular physical activity as tolerated and healthy diet with avoidance of added sugar food intake is an important part of treatment and recommended. Annual eye exam, periodic dental and foot care recommended. F/U in 5-6 months  -     Basic metabolic panel -     Hemoglobin A1c -     Fructosamine  Chronic obstructive pulmonary disease, unspecified COPD type (HCC)  Cough improved on dyspnea back to her baseline. No changes in current management. Instructed about warning signs.  Allergic rhinitis, unspecified seasonality, unspecified trigger  Nasal irrigation with saline. Singulair 10 mg added today.  -     montelukast (SINGULAIR) 10 MG tablet; Take 1 tablet (10 mg total) by mouth at bedtime.  Incontinence of feces, unspecified fecal incontinence type  Possible causes discomfort, most likely related to constipation. If not better after improvement of constipation we may need to consider GI evaluation.  Gouty arthritis of great toe  Joint pain she is reporting today could be related to OA. further recommendations will be given according to lab results.  -     Uric acid  Constipation, unspecified constipation type  OTC medication has not helped. Adequate fiber and fluid intake. After discussion of other pharmacologic treatments, she agrees with trying Linzess, she understands side effects. Instructed about warning signs  -     linaclotide (LINZESS) 145 MCG CAPS capsule; Take 1 capsule (145 mcg total) by mouth daily before breakfast.         Harry Bark G. [name], MD  [facility]. [place] office.

## 2017-07-21 LAB — FRUCTOSAMINE: Fructosamine: 266 umol/L (ref 190–270)

## 2017-07-22 ENCOUNTER — Telehealth: Payer: Self-pay | Admitting: Family Medicine

## 2017-07-22 NOTE — Telephone Encounter (Signed)
Paper work dropped off by Rep with [employer] to be completed. Placed in white team folder

## 2017-07-26 NOTE — Telephone Encounter (Signed)
Form placed in PCP box. Katharina Caper, [name], [name]

## 2017-07-26 NOTE — Telephone Encounter (Signed)
I cannot complete the form.  Patient has not seen me since 12/29/16.  Per Epic notes, now seeing Dr. Betty [name], a new PCP not in our office.

## 2017-07-26 NOTE — Telephone Encounter (Signed)
Informed York Spaniel from [employer] that we would not be able to complete form due to the fact that pt has been going somewhere else. Placed forms up front for her to pick up. Katharina Caper, [name], [name]

## 2017-07-28 ENCOUNTER — Telehealth: Payer: Self-pay | Admitting: Family Medicine

## 2017-07-28 NOTE — Telephone Encounter (Signed)
Allopurinol rx not sent in. Need to know dose and directions.

## 2017-07-28 NOTE — Telephone Encounter (Signed)
Copied from [place] [id number]. Topic: General - Other >> Jul 28, 2017  9:33 AM Yvette Rack wrote: Reason for CRM: patient calling stating that she had spoken with [facility] Dr Morrison Old Assistant a few days ago about sending over gout medicine to her pharmacy [facility], [place] [phone] (Phone) [phone] (Fax)

## 2017-07-29 ENCOUNTER — Other Ambulatory Visit: Payer: Self-pay | Admitting: Family Medicine

## 2017-07-29 MED ORDER — COLCHICINE 0.6 MG PO TABS: ORAL_TABLET | ORAL | 0 refills | Status: DC

## 2017-07-29 MED ORDER — ALLOPURINOL 100 MG PO TABS: 100.0000 mg | ORAL_TABLET | Freq: Every day | ORAL | 3 refills | Status: DC

## 2017-07-29 NOTE — Telephone Encounter (Signed)
Rx's for allopurinol and colchicine sent to her pharmacy. She will be taking Allopurinol 100 mg daily, because this medication can increase risk of gout attack when first started I also want her to take Colchicine 0.6 mg twice daily for a week. Upon acute gout onset (red,edematous joint) she can take prn Colchicine 0.6 mg 2 tablets once asap. Thanks

## 2017-07-29 NOTE — Telephone Encounter (Signed)
Spoke with patient, gave instructions on medications, patient verbalized understanding.

## 2017-08-14 DIAGNOSIS — J45909 Unspecified asthma, uncomplicated: Secondary | ICD-10-CM | POA: Diagnosis not present

## 2017-08-15 ENCOUNTER — Other Ambulatory Visit: Payer: Self-pay | Admitting: Family Medicine

## 2017-08-15 DIAGNOSIS — E1149 Type 2 diabetes mellitus with other diabetic neurological complication: Secondary | ICD-10-CM

## 2017-08-19 ENCOUNTER — Ambulatory Visit (HOSPITAL_COMMUNITY): Payer: Medicare Other | Admitting: Psychiatry

## 2017-09-01 ENCOUNTER — Observation Stay (HOSPITAL_COMMUNITY)
Admission: EM | Admit: 2017-09-01 | Discharge: 2017-09-02 | Disposition: A | Discharge status: Home / Self Care | Payer: Medicare Other | Attending: Family Medicine | Admitting: Family Medicine

## 2017-09-01 ENCOUNTER — Emergency Department (HOSPITAL_COMMUNITY): Payer: Medicare Other

## 2017-09-01 ENCOUNTER — Emergency Department (HOSPITAL_BASED_OUTPATIENT_CLINIC_OR_DEPARTMENT_OTHER)
Admit: 2017-09-01 | Discharge: 2017-09-01 | Disposition: A | Discharge status: Home / Self Care | Payer: Medicare Other | Attending: Emergency Medicine | Admitting: Emergency Medicine

## 2017-09-01 ENCOUNTER — Encounter (HOSPITAL_COMMUNITY): Payer: Self-pay | Admitting: Emergency Medicine

## 2017-09-01 DIAGNOSIS — N179 Acute kidney failure, unspecified: Principal | ICD-10-CM | POA: Insufficient documentation

## 2017-09-01 DIAGNOSIS — Z955 Presence of coronary angioplasty implant and graft: Secondary | ICD-10-CM | POA: Diagnosis not present

## 2017-09-01 DIAGNOSIS — F419 Anxiety disorder, unspecified: Secondary | ICD-10-CM | POA: Diagnosis not present

## 2017-09-01 DIAGNOSIS — E1121 Type 2 diabetes mellitus with diabetic nephropathy: Secondary | ICD-10-CM | POA: Insufficient documentation

## 2017-09-01 DIAGNOSIS — M329 Systemic lupus erythematosus, unspecified: Secondary | ICD-10-CM | POA: Diagnosis not present

## 2017-09-01 DIAGNOSIS — N183 Chronic kidney disease, stage 3 unspecified: Secondary | ICD-10-CM | POA: Diagnosis present

## 2017-09-01 DIAGNOSIS — I5040 Unspecified combined systolic (congestive) and diastolic (congestive) heart failure: Secondary | ICD-10-CM | POA: Insufficient documentation

## 2017-09-01 DIAGNOSIS — F209 Schizophrenia, unspecified: Secondary | ICD-10-CM | POA: Diagnosis not present

## 2017-09-01 DIAGNOSIS — Z7982 Long term (current) use of aspirin: Secondary | ICD-10-CM | POA: Insufficient documentation

## 2017-09-01 DIAGNOSIS — R609 Edema, unspecified: Secondary | ICD-10-CM | POA: Diagnosis not present

## 2017-09-01 DIAGNOSIS — M25572 Pain in left ankle and joints of left foot: Secondary | ICD-10-CM | POA: Diagnosis not present

## 2017-09-01 DIAGNOSIS — I130 Hypertensive heart and chronic kidney disease with heart failure and stage 1 through stage 4 chronic kidney disease, or unspecified chronic kidney disease: Secondary | ICD-10-CM | POA: Diagnosis not present

## 2017-09-01 DIAGNOSIS — M797 Fibromyalgia: Secondary | ICD-10-CM | POA: Diagnosis not present

## 2017-09-01 DIAGNOSIS — E1149 Type 2 diabetes mellitus with other diabetic neurological complication: Secondary | ICD-10-CM | POA: Diagnosis not present

## 2017-09-01 DIAGNOSIS — D573 Sickle-cell trait: Secondary | ICD-10-CM | POA: Insufficient documentation

## 2017-09-01 DIAGNOSIS — M109 Gout, unspecified: Secondary | ICD-10-CM | POA: Diagnosis not present

## 2017-09-01 DIAGNOSIS — E559 Vitamin D deficiency, unspecified: Secondary | ICD-10-CM | POA: Insufficient documentation

## 2017-09-01 DIAGNOSIS — I110 Hypertensive heart disease with heart failure: Secondary | ICD-10-CM | POA: Diagnosis present

## 2017-09-01 DIAGNOSIS — R404 Transient alteration of awareness: Secondary | ICD-10-CM | POA: Diagnosis not present

## 2017-09-01 DIAGNOSIS — N184 Chronic kidney disease, stage 4 (severe): Secondary | ICD-10-CM | POA: Diagnosis present

## 2017-09-01 DIAGNOSIS — Z79899 Other long term (current) drug therapy: Secondary | ICD-10-CM | POA: Insufficient documentation

## 2017-09-01 DIAGNOSIS — M79605 Pain in left leg: Secondary | ICD-10-CM | POA: Diagnosis not present

## 2017-09-01 DIAGNOSIS — E039 Hypothyroidism, unspecified: Secondary | ICD-10-CM | POA: Diagnosis present

## 2017-09-01 DIAGNOSIS — F319 Bipolar disorder, unspecified: Secondary | ICD-10-CM | POA: Diagnosis not present

## 2017-09-01 DIAGNOSIS — M25472 Effusion, left ankle: Secondary | ICD-10-CM | POA: Insufficient documentation

## 2017-09-01 DIAGNOSIS — Z880 Allergy status to penicillin: Secondary | ICD-10-CM | POA: Insufficient documentation

## 2017-09-01 DIAGNOSIS — K219 Gastro-esophageal reflux disease without esophagitis: Secondary | ICD-10-CM | POA: Diagnosis not present

## 2017-09-01 DIAGNOSIS — Z794 Long term (current) use of insulin: Secondary | ICD-10-CM | POA: Insufficient documentation

## 2017-09-01 DIAGNOSIS — Z888 Allergy status to other drugs, medicaments and biological substances status: Secondary | ICD-10-CM | POA: Insufficient documentation

## 2017-09-01 DIAGNOSIS — M1990 Unspecified osteoarthritis, unspecified site: Secondary | ICD-10-CM | POA: Insufficient documentation

## 2017-09-01 DIAGNOSIS — E1122 Type 2 diabetes mellitus with diabetic chronic kidney disease: Secondary | ICD-10-CM | POA: Insufficient documentation

## 2017-09-01 DIAGNOSIS — Z9981 Dependence on supplemental oxygen: Secondary | ICD-10-CM | POA: Diagnosis not present

## 2017-09-01 DIAGNOSIS — I2510 Atherosclerotic heart disease of native coronary artery without angina pectoris: Secondary | ICD-10-CM | POA: Insufficient documentation

## 2017-09-01 DIAGNOSIS — Z87891 Personal history of nicotine dependence: Secondary | ICD-10-CM | POA: Insufficient documentation

## 2017-09-01 DIAGNOSIS — R531 Weakness: Secondary | ICD-10-CM | POA: Diagnosis not present

## 2017-09-01 LAB — CBC WITH DIFFERENTIAL/PLATELET
Basophils Absolute: 0 10*3/uL (ref 0.0–0.1)
Basophils Relative: 0 %
Eosinophils Absolute: 0 10*3/uL (ref 0.0–0.7)
Eosinophils Relative: 0 %
HCT: 33.1 % — ABNORMAL LOW (ref 36.0–46.0)
Hemoglobin: 11.4 g/dL — ABNORMAL LOW (ref 12.0–15.0)
Lymphocytes Relative: 23 %
Lymphs Abs: 3 10*3/uL (ref 0.7–4.0)
MCH: 24.6 pg — ABNORMAL LOW (ref 26.0–34.0)
MCHC: 34.4 g/dL (ref 30.0–36.0)
MCV: 71.3 fL — ABNORMAL LOW (ref 78.0–100.0)
Monocytes Absolute: 0.7 10*3/uL (ref 0.1–1.0)
Monocytes Relative: 5 %
Neutro Abs: 9.5 10*3/uL — ABNORMAL HIGH (ref 1.7–7.7)
Neutrophils Relative %: 72 %
Platelets: 187 10*3/uL (ref 150–400)
RBC: 4.64 MIL/uL (ref 3.87–5.11)
RDW: 14.9 % (ref 11.5–15.5)
WBC: 13.2 10*3/uL — ABNORMAL HIGH (ref 4.0–10.5)

## 2017-09-01 LAB — BASIC METABOLIC PANEL
Anion gap: 9 (ref 5–15)
BUN: 35 mg/dL — ABNORMAL HIGH (ref 6–20)
CO2: 23 mmol/L (ref 22–32)
Calcium: 9.1 mg/dL (ref 8.9–10.3)
Chloride: 111 mmol/L (ref 101–111)
Creatinine, Ser: 1.82 mg/dL — ABNORMAL HIGH (ref 0.44–1.00)
GFR calc Af Amer: 30 mL/min — ABNORMAL LOW (ref >60)
GFR calc non Af Amer: 26 mL/min — ABNORMAL LOW (ref >60)
Glucose, Bld: 133 mg/dL — ABNORMAL HIGH (ref 65–99)
Potassium: 4.1 mmol/L (ref 3.5–5.1)
Sodium: 143 mmol/L (ref 135–145)

## 2017-09-01 LAB — GLUCOSE, CAPILLARY: Glucose-Capillary: 120 mg/dL — ABNORMAL HIGH (ref 65–99)

## 2017-09-01 LAB — CK: Total CK: 360 U/L — ABNORMAL HIGH (ref 38–234)

## 2017-09-01 MED ORDER — ENOXAPARIN SODIUM 40 MG/0.4ML ~~LOC~~ SOLN
40.0000 mg | SUBCUTANEOUS | Status: DC
  Administered 2017-09-02: 40 mg via SUBCUTANEOUS
  Filled 2017-09-01: qty 0.4

## 2017-09-01 MED ORDER — SODIUM CHLORIDE 0.9 % IV SOLN
INTRAVENOUS | Status: AC
  Administered 2017-09-01: 20:00:00 via INTRAVENOUS

## 2017-09-01 MED ORDER — PANTOPRAZOLE SODIUM 40 MG PO TBEC
40.0000 mg | DELAYED_RELEASE_TABLET | Freq: Every day | ORAL | Status: DC
  Administered 2017-09-01 – 2017-09-02 (×2): 40 mg via ORAL
  Filled 2017-09-01 (×2): qty 1

## 2017-09-01 MED ORDER — AMLODIPINE BESYLATE 10 MG PO TABS
10.0000 mg | ORAL_TABLET | Freq: Every day | ORAL | Status: DC
  Administered 2017-09-02: 10 mg via ORAL
  Filled 2017-09-01: qty 1

## 2017-09-01 MED ORDER — MOMETASONE FURO-FORMOTEROL FUM 200-5 MCG/ACT IN AERO
2.0000 | INHALATION_SPRAY | Freq: Two times a day (BID) | RESPIRATORY_TRACT | Status: DC
  Administered 2017-09-01 – 2017-09-02 (×2): 2 via RESPIRATORY_TRACT
  Filled 2017-09-01: qty 8.8

## 2017-09-01 MED ORDER — HYDROCODONE-ACETAMINOPHEN 5-325 MG PO TABS
1.0000 | ORAL_TABLET | ORAL | Status: DC | PRN
  Administered 2017-09-01: 1 via ORAL
  Filled 2017-09-01: qty 1

## 2017-09-01 MED ORDER — INSULIN NPH (HUMAN) (ISOPHANE) 100 UNIT/ML ~~LOC~~ SUSP
20.0000 [IU] | Freq: Every day | SUBCUTANEOUS | Status: DC
  Administered 2017-09-02: 20 [IU] via SUBCUTANEOUS
  Filled 2017-09-01: qty 10

## 2017-09-01 MED ORDER — ALBUTEROL SULFATE (2.5 MG/3ML) 0.083% IN NEBU: 3.0000 mL | INHALATION_SOLUTION | Freq: Four times a day (QID) | RESPIRATORY_TRACT | Status: DC | PRN

## 2017-09-01 MED ORDER — GABAPENTIN 300 MG PO CAPS
300.0000 mg | ORAL_CAPSULE | Freq: Two times a day (BID) | ORAL | Status: DC
  Administered 2017-09-01 – 2017-09-02 (×2): 300 mg via ORAL
  Filled 2017-09-01 (×2): qty 1

## 2017-09-01 MED ORDER — LEVETIRACETAM 500 MG PO TABS
500.0000 mg | ORAL_TABLET | Freq: Two times a day (BID) | ORAL | Status: DC
  Administered 2017-09-01 – 2017-09-02 (×2): 500 mg via ORAL
  Filled 2017-09-01 (×2): qty 1

## 2017-09-01 MED ORDER — METOPROLOL TARTRATE 50 MG PO TABS
50.0000 mg | ORAL_TABLET | Freq: Every day | ORAL | Status: DC
  Administered 2017-09-01 – 2017-09-02 (×2): 50 mg via ORAL
  Filled 2017-09-01 (×2): qty 1

## 2017-09-01 MED ORDER — ASPIRIN EC 81 MG PO TBEC
162.0000 mg | DELAYED_RELEASE_TABLET | Freq: Every day | ORAL | Status: DC
  Administered 2017-09-01 – 2017-09-02 (×2): 162 mg via ORAL
  Filled 2017-09-01 (×3): qty 2

## 2017-09-01 MED ORDER — DOXYCYCLINE HYCLATE 100 MG PO TABS
100.0000 mg | ORAL_TABLET | Freq: Once | ORAL | Status: AC
  Administered 2017-09-01: 100 mg via ORAL
  Filled 2017-09-01: qty 1

## 2017-09-01 MED ORDER — INSULIN ASPART 100 UNIT/ML ~~LOC~~ SOLN: 0.0000 [IU] | Freq: Three times a day (TID) | SUBCUTANEOUS | Status: DC

## 2017-09-01 MED ORDER — COLCHICINE 0.6 MG PO TABS
1.2000 mg | ORAL_TABLET | Freq: Once | ORAL | Status: AC
  Administered 2017-09-01: 1.2 mg via ORAL
  Filled 2017-09-01: qty 2

## 2017-09-01 MED ORDER — COLCHICINE 0.6 MG PO TABS
0.6000 mg | ORAL_TABLET | Freq: Once | ORAL | Status: AC
  Administered 2017-09-01: 0.6 mg via ORAL
  Filled 2017-09-01: qty 1

## 2017-09-01 MED ORDER — HYDROCODONE-ACETAMINOPHEN 5-325 MG PO TABS
1.0000 | ORAL_TABLET | Freq: Once | ORAL | Status: AC
  Administered 2017-09-01: 1 via ORAL
  Filled 2017-09-01: qty 1

## 2017-09-01 MED ORDER — ALLOPURINOL 100 MG PO TABS
100.0000 mg | ORAL_TABLET | Freq: Every day | ORAL | Status: DC
  Administered 2017-09-02: 100 mg via ORAL
  Filled 2017-09-01 (×2): qty 1

## 2017-09-01 MED ORDER — COLCHICINE 0.6 MG PO TABS
0.6000 mg | ORAL_TABLET | Freq: Two times a day (BID) | ORAL | Status: DC
  Administered 2017-09-02: 0.6 mg via ORAL
  Filled 2017-09-01: qty 1

## 2017-09-01 MED ORDER — ATORVASTATIN CALCIUM 20 MG PO TABS
20.0000 mg | ORAL_TABLET | Freq: Every day | ORAL | Status: DC
  Administered 2017-09-01 – 2017-09-02 (×2): 20 mg via ORAL
  Filled 2017-09-01 (×2): qty 1

## 2017-09-01 NOTE — Progress Notes (Signed)
Left lower extremity venous duplex has been completed. Negative for obvious evidence of DVT. Results were given to Langston Masker PA.  09/01/17 2:37 PM Carlos Levering RVT

## 2017-09-01 NOTE — ED Notes (Signed)
1x attempt to call report no answer

## 2017-09-01 NOTE — ED Notes (Signed)
ED TO INPATIENT HANDOFF REPORT  Name/Age/Gender Brittany Roberts 77 y.o. female  Code Status Code Status History    Date Active Date Inactive Code Status Order ID Comments User Context   01/13/2017 18:03 01/18/2017 18:48 DNR [id number]  Verner Mould, MD ED   07/01/2015 09:21 07/01/2015 21:39 Full Code [id number]  Adrian Prows, MD Inpatient   06/30/2015 17:43 07/01/2015 09:21 Full Code [id number]  Adrian Prows, MD Inpatient   08/02/2014 01:40 08/04/2014 18:35 Full Code [id number]  Lorna Few, DO Inpatient   07/31/2014 15:34 08/01/2014 03:35 Full Code [id number]  Corrie Mckusick, DO HOV    Questions for Most Recent Historical Code Status (Order [id number])    Question Answer Comment   In the event of cardiac or respiratory ARREST Do not call a "code blue"    In the event of cardiac or respiratory ARREST Do not perform Intubation, CPR, defibrillation or ACLS    In the event of cardiac or respiratory ARREST Use medication by any route, position, wound care, and other measures to relive pain and suffering. May use oxygen, suction and manual treatment of airway obstruction as needed for comfort.         Advance Directive Documentation     Most Recent Value  Type of Advance Directive  Healthcare Power of Attorney, Living will [pt reports they have not been notarized]  Pre-existing out of facility DNR order (yellow form or pink MOST form)  No data  "MOST" Form in Place?  No data      Home/SNF/Other Home  Chief Complaint Leg Pain  Level of Care/Admitting Diagnosis ED Disposition    ED Disposition Condition Comment   Admit  Hospital Area: [facility] [[id number]]  Level of Care: Med-Surg [16]  Diagnosis: AKI (acute kidney injury) [facility]) [[id number]]  Admitting Physician: Bethena Roys [phone]  Attending Physician: Bethena Roys Nessa.Cuff  PT Class (Do Not Modify): Observation [104]  PT Acc Code (Do Not Modify): Observation [10022]       Medical  History Past Medical History:  Diagnosis Date  . Acute delirium 01/14/2017  . Anxiety   . Arthritis    "all over"  . Asthma   . Bipolar disorder ([place])   . Complication of anesthesia    "w/right foot OR they gave me too much and they couldn't get me woke"  . Congestive heart failure, unspecified   . Coronary artery calcification seen on CAT scan 01/14/2017  . Coronary artery disease    "I've got 1 stent" (06/30/2015)  . Depression   . Discoid lupus   . Fibromyalgia    "I've been told I have this; can't take Lyrica cause I'm allergic to it" (06/30/2015)  . GERD (gastroesophageal reflux disease)   . History of gout   . History of hiatal hernia    "real bad" (06/30/2015)  . Hypertension   . Hypothyroidism   . On home oxygen therapy    "2L q hs" (06/30/2015)  . OSA (obstructive sleep apnea)    "just use my oxygen; no mask" (06/30/2015)  . Other and unspecified hyperlipidemia   . Penetrating foot wound    left nonhealing foot wound on the dorsal surface  . Peripheral neuropathy   . Pneumonia "many of times"  . Refusal of blood transfusions as patient is Jehovah's Witness   . Renal failure, unspecified    "kidneys not working 100%" (06/30/2015)  . Sickle cell trait ([place])   . Systemic lupus ([place])   .  Thoracic aortic atherosclerosis ([place]) 01/14/2017  . Type II diabetes mellitus ([place])   . Unspecified vitamin D deficiency     Allergies Allergies  Allergen Reactions  . Ace Inhibitors Other (See Comments)    Coincided with sig bump in creat. Retried and creat bumped again.  Beulah Gandy [Isosorb Dinitrate-Hydralazine] Other (See Comments)    Sleep all the time.  Marland Kitchen Penicillins Anaphylaxis, Swelling and Rash    Has patient had a PCN reaction causing immediate rash, facial/tongue/throat swelling, SOB or lightheadedness with hypotension: Yes Has patient had a PCN reaction causing severe rash involving mucus membranes or skin necrosis: Yes Has patient had a PCN reaction that required  hospitalization: Yes Has patient had a PCN reaction occurring within the last 10 years: No If all of the above answers are "NO", then may proceed with Cephalosporin use.   Ebbie Ridge [Buspirone] Other (See Comments)    pain  . Pregabalin Swelling  . Ropinirole Hydrochloride Swelling  . Amantadines Rash    "Swelling of the tongue"    IV Location/Drains/Wounds Patient Lines/Drains/Airways Status   Active Line/Drains/Airways    Name:   Placement date:   Placement time:   Site:   Days:   External Urinary Catheter   09/01/17    1841    -   less than 1          Labs/Imaging Results for orders placed or performed during the hospital encounter of 09/01/17 (from the past 48 hour(s))  Basic metabolic panel     Status: Abnormal   Collection Time: 09/01/17  2:54 PM  Result Value Ref Range   Sodium 143 135 - 145 mmol/L   Potassium 4.1 3.5 - 5.1 mmol/L   Chloride 111 101 - 111 mmol/L   CO2 23 22 - 32 mmol/L   Glucose, Bld 133 (H) 65 - 99 mg/dL   BUN 35 (H) 6 - 20 mg/dL   Creatinine, Ser 1.82 (H) 0.44 - 1.00 mg/dL   Calcium 9.1 8.9 - 10.3 mg/dL   GFR calc non Af Amer 26 (L) >60 mL/min   GFR calc Af Amer 30 (L) >60 mL/min    Comment: (NOTE) The eGFR has been calculated using the CKD EPI equation. This calculation has not been validated in all clinical situations. eGFR's persistently <60 mL/min signify possible Chronic Kidney Disease.    Anion gap 9 5 - 15    Comment: Performed at [facility], [place] [address] [place], [place] [address]  CBC with Differential     Status: Abnormal   Collection Time: 09/01/17  2:54 PM  Result Value Ref Range   WBC 13.2 (H) 4.0 - 10.5 K/uL   RBC 4.64 3.87 - 5.11 MIL/uL   Hemoglobin 11.4 (L) 12.0 - 15.0 g/dL   HCT 33.1 (L) 36.0 - 46.0 %   MCV 71.3 (L) 78.0 - 100.0 fL   MCH 24.6 (L) 26.0 - 34.0 pg   MCHC 34.4 30.0 - 36.0 g/dL   RDW 14.9 11.5 - 15.5 %   Platelets 187 150 - 400 K/uL   Neutrophils Relative % 72 %   Lymphocytes  Relative 23 %   Monocytes Relative 5 %   Eosinophils Relative 0 %   Basophils Relative 0 %   Neutro Abs 9.5 (H) 1.7 - 7.7 K/uL   Lymphs Abs 3.0 0.7 - 4.0 K/uL   Monocytes Absolute 0.7 0.1 - 1.0 K/uL   Eosinophils Absolute 0.0 0.0 - 0.7 K/uL   Basophils  Absolute 0.0 0.0 - 0.1 K/uL   RBC Morphology TARGET CELLS     Comment: Performed at [facility], [place] [address], [place], [place] [address]   Dg Ankle Complete Left  Result Date: 09/01/2017 CLINICAL DATA:  Ankle pain with tenderness EXAM: LEFT ANKLE COMPLETE - 3+ VIEW COMPARISON:  None. FINDINGS: No acute displaced fracture or malalignment. The ankle mortise is symmetric. Mild degenerative changes medially. Mild vascular calcifications. IMPRESSION: No acute osseous abnormality. Electronically Signed   By: Donavan Foil M.D.   On: 09/01/2017 17:05    Pending Labs Unresulted Labs (From admission, onward)   Start     Ordered   09/01/17 1919  CK  Add-on,   R     09/01/17 1918   Signed and Held  Basic metabolic panel  Tomorrow morning,   R     Signed and Held   Signed and Held  CBC  Tomorrow morning,   R     Signed and Held      Vitals/Pain Today's Vitals   09/01/17 1531 09/01/17 1739 09/01/17 1900 09/01/17 1930  BP: (!) 155/86 (!) 154/92 123/67 (!) 146/78  Pulse: 80 71 69 (!) 58  Resp: 18 16    Temp: (!) 97.5 F (36.4 C)     TempSrc: Oral     SpO2: 94% 95% 100% 92%    Isolation Precautions No active isolations  Medications Medications  0.9 %  sodium chloride infusion (not administered)  colchicine tablet 1.2 mg (not administered)  colchicine tablet 0.6 mg (not administered)  HYDROcodone-acetaminophen (NORCO/VICODIN) 5-325 MG per tablet 1 tablet (not administered)  HYDROcodone-acetaminophen (NORCO/VICODIN) 5-325 MG per tablet 1 tablet (1 tablet Oral Given 09/01/17 1505)  HYDROcodone-acetaminophen (NORCO/VICODIN) 5-325 MG per tablet 1 tablet (1 tablet Oral Given 09/01/17 1757)  doxycycline (VIBRA-TABS)  tablet 100 mg (100 mg Oral Given 09/01/17 1758)    Mobility Non-ambulatory

## 2017-09-01 NOTE — H&P (Signed)
History and Physical    Brittany Roberts TKZ:[id number] DOB: May 23, 1941 DOA: 09/01/2017  PCP: [name], [name], MD   Patient coming from: Home  Chief Complaint: Left ankle pain  HPI: Brittany Roberts is a 77 y.o. female with medical history significant for gout, depression, diastolic and systolic CHF, COPD, CKD3, asthma, SLE, schizophrenia bipolar disorder, seizure disorder, presented to the ED today with complaints of left leg pain swelling redness now of 3 days duration. patient has a history of gout but usually gout symptoms on left big toe.  She endorses compliance with allopurinol, and continued Lasix. she did not take her as needed colchicine for this pain.   Patient denies fever or chills, no nausea or vomiting.   Patient lives alone, patient reports for the past 2 days, she has not been able to ambulate or bear weight, preparing her meals.  Reports she fell this morning when she tried to bear weight, but did not hit her head.  She was on the floor for at least an hour. She reports multiple episodes of loose stools the past week, none bloody, at least 5 times daily, but none in the past 24 hours.  No abdominal pain, recent antibiotic prescriptions.  ED Course: Stable vitals.  WBC mildly elevated to 13.2.  Creatinine elevated at 1.8, BUN elevated at 35, blood glucose 133.  Right lower extremity x-ray right lower extremity ultrasound negative for DVT and acute abnormality.  As patient unable to ambulate but hospitalist was consulted for admission for cellulitis.  Pt was started on p.o. Doxycycline.  Review of Systems: As per HPI otherwise 10 point review of systems negative.  Past Medical History:  Diagnosis Date  . Acute delirium 01/14/2017  . Anxiety   . Arthritis    "all over"  . Asthma   . Bipolar disorder ([place])   . Complication of anesthesia    "w/right foot OR they gave me too much and they couldn't get me woke"  . Congestive heart failure, unspecified   . Coronary artery calcification  seen on CAT scan 01/14/2017  . Coronary artery disease    "I've got 1 stent" (06/30/2015)  . Depression   . Discoid lupus   . Fibromyalgia    "I've been told I have this; can't take Lyrica cause I'm allergic to it" (06/30/2015)  . GERD (gastroesophageal reflux disease)   . History of gout   . History of hiatal hernia    "real bad" (06/30/2015)  . Hypertension   . Hypothyroidism   . On home oxygen therapy    "2L q hs" (06/30/2015)  . OSA (obstructive sleep apnea)    "just use my oxygen; no mask" (06/30/2015)  . Other and unspecified hyperlipidemia   . Penetrating foot wound    left nonhealing foot wound on the dorsal surface  . Peripheral neuropathy   . Pneumonia "many of times"  . Refusal of blood transfusions as patient is Jehovah's Witness   . Renal failure, unspecified    "kidneys not working 100%" (06/30/2015)  . Sickle cell trait ([place])   . Systemic lupus ([place])   . Thoracic aortic atherosclerosis ([place]) 01/14/2017  . Type II diabetes mellitus ([place])   . Unspecified vitamin D deficiency     Past Surgical History:  Procedure Laterality Date  . ANKLE ARTHROSCOPY WITH OPEN REDUCTION INTERNAL FIXATION (ORIF) Right 03/2011  . CARDIAC CATHETERIZATION N/A 07/01/2015   Procedure: Left Heart Cath and Coronary Angiography;  Surgeon: Adrian Prows, MD;  Location:  [facility];  Service: Cardiovascular;  Laterality: N/A;  . CARDIAC CATHETERIZATION N/A 07/01/2015   Procedure: Intravascular Pressure Wire/FFR Study;  Surgeon: Adrian Prows, MD;  Location: [facility];  Service: Cardiovascular;  Laterality: N/A;  . CARPAL TUNNEL RELEASE Bilateral   . CATARACT EXTRACTION W/ INTRAOCULAR LENS  IMPLANT, BILATERAL Bilateral   . CHOLECYSTECTOMY OPEN    . COLONOSCOPY N/A 12/10/2013   Procedure: COLONOSCOPY;  Surgeon: Inda Castle, MD;  Location: [facility];  Service: Endoscopy;  Laterality: N/A;  . FRACTURE SURGERY    . INCISION AND DRAINAGE ABSCESS Left    foot "under my little toe"  .  KNEE LIGAMENT RECONSTRUCTION Right   . LEFT HEART CATHETERIZATION WITH CORONARY ANGIOGRAM N/A 08/08/2012   Procedure: LEFT HEART CATHETERIZATION WITH CORONARY ANGIOGRAM;  Surgeon: [name], MD;  Location: [facility] CATH LAB;  Service: Cardiovascular;  Laterality: N/A;  . NASAL SINUS SURGERY    . PERCUTANEOUS CORONARY STENT INTERVENTION (PCI-S) N/A 08/21/2012   Procedure: PERCUTANEOUS CORONARY STENT INTERVENTION (PCI-S);  Surgeon: [name], MD;  Location: [facility] CATH LAB;  Service: Cardiovascular;  Laterality: N/A;  . TUBAL LIGATION       reports that she quit smoking about 19 years ago. Her smoking use included cigarettes. She started smoking about 57 years ago. She has a 120.00 pack-year smoking history. she has never used smokeless tobacco. She reports that she does not drink alcohol or use drugs.  Allergies  Allergen Reactions  . Ace Inhibitors Other (See Comments)    Coincided with sig bump in creat. Retried and creat bumped again.  Beulah Gandy [Isosorb Dinitrate-Hydralazine] Other (See Comments)    Sleep all the time.  Marland Kitchen Penicillins Anaphylaxis, Swelling and Rash    Has patient had a PCN reaction causing immediate rash, facial/tongue/throat swelling, SOB or lightheadedness with hypotension: Yes Has patient had a PCN reaction causing severe rash involving mucus membranes or skin necrosis: Yes Has patient had a PCN reaction that required hospitalization: Yes Has patient had a PCN reaction occurring within the last 10 years: No If all of the above answers are "NO", then may proceed with Cephalosporin use.   Ebbie Ridge [Buspirone] Other (See Comments)    pain  . Pregabalin Swelling  . Ropinirole Hydrochloride Swelling  . Amantadines Rash    "Swelling of the tongue"    Family History  Problem Relation Age of Onset  . Heart disease Mother   . Diabetes Mother   . Clotting disorder Mother   . Pneumonia Father   . Rheum arthritis Father   . Diabetes Sister   . Diabetes Sister   .  Asthma Brother   . Cancer Brother   . Kidney disease Brother   . Lupus Son   . Heart disease Son   . Heart disease Daughter   . Colon cancer Neg Hx     Prior to Admission medications   Medication Sig Start Date End Date Taking? Authorizing Provider  acetaminophen (TYLENOL) 325 MG tablet Take 650 mg by mouth every 6 (six) hours as needed for mild pain or moderate pain. 01/24/17  Yes [provider]  albuterol (PROAIR HFA) 108 (90 Base) MCG/ACT inhaler inhale 2 puffs by mouth every 4 hours if needed USE ONLY IF YOU ARE WHEEZING 05/13/16  Yes Hensel, Jamal Collin, MD  allopurinol (ZYLOPRIM) 100 MG tablet Take 1 tablet (100 mg total) by mouth daily. 07/29/17  Yes [name], [name], MD  amLODipine (NORVASC) 10 MG  tablet Take 1 tablet (10 mg total) by mouth daily. 12/22/16  Yes Hensel, Jamal Collin, MD  Ascorbic Acid (VITAMIN C PO) Take 1 tablet by mouth daily.   Yes [provider]  aspirin EC 81 MG tablet Take 162 mg by mouth daily.    Yes [provider]  atorvastatin (LIPITOR) 20 MG tablet Take 1 tablet (20 mg total) by mouth daily. 04/12/17  Yes [name], [name], MD  B Complex Vitamins (VITAMIN B COMPLEX) TABS Take 1 tablet by mouth daily.   Yes [provider]  budesonide-formoterol (SYMBICORT) 160-4.5 MCG/ACT inhaler Inhale 2 puffs into the lungs 2 (two) times daily. 05/25/17  Yes [name], [name], MD  Cholecalciferol (VITAMIN D3) 5000 UNITS CAPS Take 5,000 Units by mouth daily.   Yes [provider]  Flaxseed, Linseed, (FLAXSEED OIL PO) Take 1 capsule by mouth daily.   Yes [provider]  furosemide (LASIX) 40 MG tablet Take 40 mg by mouth daily. 08/15/17  Yes [provider]  gabapentin (NEURONTIN) 300 MG capsule Take 1 capsule (300 mg total) by mouth 2 (two) times daily. 08/30/16  Yes Hensel, Jamal Collin, MD  guaiFENesin (ROBITUSSIN) 100 MG/5ML liquid Take 200 mg by mouth 3 (three) times daily as needed for cough.   Yes [provider]   Insulin NPH, Human,, Isophane, (HUMULIN N KWIKPEN) 100 UNIT/ML Kiwkpen Inject 30 Units into the skin every morning. 07/15/17  Yes [name], [name], MD  levETIRAcetam (KEPPRA) 500 MG tablet Take 1 tablet (500 mg total) by mouth 2 (two) times daily. 04/25/17  Yes Cameron Sprang, MD  linaclotide [facility]) 145 MCG CAPS capsule Take 1 capsule (145 mcg total) by mouth daily before breakfast. Patient taking differently: Take 145 mcg by mouth daily as needed (constipation).  07/19/17  Yes [name], [name], MD  metFORMIN (GLUCOPHAGE) 500 MG tablet take 1 tablet by mouth twice a day with meals for diabetes 08/15/17  Yes [name], [name], MD  metoprolol tartrate (LOPRESSOR) 50 MG tablet take 1 tablet by mouth once daily WITH OR IMMEDIATELY FOLLOWING A MEAL FOR HYPERTENSION 08/15/17  Yes [name], [name], MD  Multiple Vitamin (MULTIVITAMIN WITH MINERALS) TABS tablet Take 1 tablet by mouth daily.   Yes [provider]  nitroGLYCERIN (NITROSTAT) 0.4 MG SL tablet Place 1 tablet (0.4 mg total) under the tongue every 5 (five) minutes as needed for chest pain. 05/13/16  Yes Hensel, Jamal Collin, MD  pantoprazole (PROTONIX) 40 MG tablet Take 1 tablet (40 mg total) by mouth daily. 02/07/17  Yes Hensel, Jamal Collin, MD  Polyethyl Glycol-Propyl Glycol (SYSTANE ULTRA OP) Place 1 drop into both eyes every 6 (six) hours as needed (dry eyes).   Yes [provider]  ACCU-CHEK AVIVA PLUS test strip TEST three times a day 02/09/17   Zenia Resides, MD  colchicine 0.6 MG tablet 1 tab bid for 1 week then prn 2 tabs once when acute gout onset (no more than 2 tabs per day). 07/29/17   [name], [name], MD  Insulin Pen Needle (PEN NEEDLES 31GX5/16") 31G X 8 MM MISC Check Blood sugar three times per day. Dx insulin using E11.8 07/15/17   [name], [name], MD  montelukast (SINGULAIR) 10 MG tablet Take 1 tablet (10 mg total) by mouth at bedtime. Patient not taking: Reported on 09/01/2017 07/19/17   [name], [name], MD    Physical  Exam: Vitals:   09/01/17 1245 09/01/17 1328 09/01/17 1531 09/01/17 1739  BP: Marland Kitchen)  138/99 (!) 155/78 (!) 155/86 (!) 154/92  Pulse: 74 75 80 71  Resp:  16 18 16   Temp:   (!) 97.5 F (36.4 C)   TempSrc:   Oral   SpO2: 98% 96% 94% 95%    Constitutional: NAD, calm, comfortable Vitals:   09/01/17 1245 09/01/17 1328 09/01/17 1531 09/01/17 1739  BP: (!) 138/99 (!) 155/78 (!) 155/86 (!) 154/92  Pulse: 74 75 80 71  Resp:  16 18 16   Temp:   (!) 97.5 F (36.4 C)   TempSrc:   Oral   SpO2: 98% 96% 94% 95%   Eyes: PERRL, lids and conjunctivae normal ENMT: Mucous membranes are very dry. Posterior pharynx clear of any exudate or lesions.Normal dentition.  Neck: normal, supple, no masses, no thyromegaly Respiratory: clear to auscultation bilaterally, no wheezing, no crackles. Normal respiratory effort. No accessory muscle use.  Cardiovascular: Regular rate and rhythm, no murmurs / rubs / gallops. No extremity edema. 2+ pedal pulses. No carotid bruits.  Abdomen: no tenderness, no masses palpated. No hepatosplenomegaly. Bowel sounds positive.  Musculoskeletal: no clubbing / cyanosis. Left  Ankle- minimal Swelling, positive tenderness to touch, normal flexion and dorsiflexion of ankle, no differential warmth, no significant erythema. No ulcers.  skin: no rashes, lesions, ulcers. No induration Neurologic: CN 2-12 grossly intact.  Strength 5/5 in all extremities, except mildly reduced LLE due to pain Psychiatric: Normal judgment and insight. Alert and oriented x 3. Normal mood.     Labs on Admission: I have personally reviewed following labs and imaging studies  CBC: Recent Labs  Lab 09/01/17 1454  WBC 13.2*  NEUTROABS 9.5*  HGB 11.4*  HCT 33.1*  MCV 71.3*  PLT 030   Basic Metabolic Panel: Recent Labs  Lab 09/01/17 1454  NA 143  K 4.1  CL 111  CO2 23  GLUCOSE 133*  BUN 35*  CREATININE 1.82*  CALCIUM 9.1   Urine analysis:    Component Value Date/Time   COLORURINE YELLOW  01/16/2017 Ricardo 01/16/2017 1539   LABSPEC 1.010 01/16/2017 1539   PHURINE 5.0 01/16/2017 1539   GLUCOSEU NEGATIVE 01/16/2017 1539   HGBUR SMALL (A) 01/16/2017 1539   HGBUR negative 02/26/2009 1314   BILIRUBINUR NEGATIVE 01/16/2017 1539   BILIRUBINUR NEG 04/05/2014 1455   KETONESUR NEGATIVE 01/16/2017 1539   PROTEINUR NEGATIVE 01/16/2017 1539   UROBILINOGEN 0.2 08/01/2014 2305   NITRITE NEGATIVE 01/16/2017 1539   LEUKOCYTESUR NEGATIVE 01/16/2017 1539    Radiological Exams on Admission: Dg Ankle Complete Left  Result Date: 09/01/2017 CLINICAL DATA:  Ankle pain with tenderness EXAM: LEFT ANKLE COMPLETE - 3+ VIEW COMPARISON:  None. FINDINGS: No acute displaced fracture or malalignment. The ankle mortise is symmetric. Mild degenerative changes medially. Mild vascular calcifications. IMPRESSION: No acute osseous abnormality. Electronically Signed   By: Donavan Foil M.D.   On: 09/01/2017 17:05   EKG: None.  Assessment/Plan Principal Problem:   AKI (acute kidney injury) ([place]) Active Problems:   Hypothyroidism   DM (diabetes mellitus), type 2 with neurological complications (HCC)   Essential hypertension   Chronic kidney disease, stage III (moderate) (HCC)   Schizophrenia, unspecified type ([place])   Gout attack  AKI on CKD3- Cr- 1.8, baseline 1.3-1.5.  Likely 2/2 1 week loose stools- now resolving, poor p.o. Intake, continued Lasix use. -Hydrate normal saline 100 cc/h for 12 hours - BMP a.m -hold lasix  Left ankle pain likely gout-inability to ambulate.  Pain, minimal swelling, no erythema, WBC mildly elevated  to 13.  No fever no chills, no differential warmth significant redness, making cellulitis less likely. -PO colchicine 1.2 mg x1 and 1 0.6 mg 1hr later - Colchicine 0.6mg  BID tomorrow -Avoiding steroids with diabetes - PT eval - Holding NSAIDS with AKI and CKD. -Cont Home allopurinol she has been compliant -Patient has penicillin allergy, recent diarrhea,  AKI, hence hesitant to use cephalosporins, Bactrim clindamycin, will hold off on antibiotics for now as clinical picture not strongly suggesting cellulitis.  If significant improvement in a.m. then likely gout.  DM-Home medications NPH 30 daily, performing 500 bid - SSI - resume NPH at reduced dose 20u in a.m  CHF-diastolic and systolic.-Appears hypovolemic -Hold home lasix for now, hydrate,    DVT prophylaxis: Lovenox Code Status: DNR- pt jehovah's witness, confirmed with daughter [name] at bedside. Family Communication: [name] at bedside Disposition Plan: 1-2 days Consults called: None  Admission status: Obs, med-surg   Bethena Roys MD [facility] Pager [phone] From 6PM-2AM.  Otherwise please contact night-coverage [email or web address] Password [facility]  09/01/2017, 7:18 PM

## 2017-09-01 NOTE — ED Provider Notes (Signed)
[facility] DEPT Provider Note   CSN: [id number] Arrival date & time: 09/01/17  1028     History   Chief Complaint Chief Complaint  Patient presents with  . Leg Pain    left     HPI Brittany Roberts is a 77 y.o. female.  HPI   Patient is a 77 year old female with a history of type 2 diabetes mellitus (on insulin and protheses, hypertension, hypothyroidism, gout, bipolar disorder, SLE, peripheral neuropathy, presenting for left ankle and calf erythema, pain, and swelling.  Patient reports that she began having pain yesterday and was having difficulty walking on her left lower extremity.  Patient reports a day her foot was caught under her dresser, and she lowered herself to the ground but was not able to get back up due to the pain in her left ankle.  Patient reports that she did not hit her head, lose consciousness, or sustain any other trauma in this fall.  Patient reports a history of gout, that usually affects her right great toe, and she is on allopurinol to prevent flares.  Patient reports this is not feel like her typical gout flare.  Patient reports she has  felt more chills in the past 48 hours, but no fevers.  Patient denies decreased appetite, nausea, or vomiting.  Patient denies any history of DVT/PE.  Past Medical History:  Diagnosis Date  . Acute delirium 01/14/2017  . Anxiety   . Arthritis    "all over"  . Asthma   . Bipolar disorder ([place])   . Complication of anesthesia    "w/right foot OR they gave me too much and they couldn't get me woke"  . Congestive heart failure, unspecified   . Coronary artery calcification seen on CAT scan 01/14/2017  . Coronary artery disease    "I've got 1 stent" (06/30/2015)  . Depression   . Discoid lupus   . Fibromyalgia    "I've been told I have this; can't take Lyrica cause I'm allergic to it" (06/30/2015)  . GERD (gastroesophageal reflux disease)   . History of gout   . History of hiatal hernia    "real bad" (06/30/2015)  . Hypertension   . Hypothyroidism   . On home oxygen therapy    "2L q hs" (06/30/2015)  . OSA (obstructive sleep apnea)    "just use my oxygen; no mask" (06/30/2015)  . Other and unspecified hyperlipidemia   . Penetrating foot wound    left nonhealing foot wound on the dorsal surface  . Peripheral neuropathy   . Pneumonia "many of times"  . Refusal of blood transfusions as patient is Jehovah's Witness   . Renal failure, unspecified    "kidneys not working 100%" (06/30/2015)  . Sickle cell trait ([place])   . Systemic lupus ([place])   . Thoracic aortic atherosclerosis ([place]) 01/14/2017  . Type II diabetes mellitus ([place])   . Unspecified vitamin D deficiency     Patient Active Problem List   Diagnosis Date Noted  . Localization-related symptomatic epilepsy and epileptic syndromes with complex partial seizures, not intractable, without status epilepticus ([place]) 04/25/2017  . Fibromyalgia 04/08/2017  . Allergic rhinitis 03/11/2017  . Cardiomyopathy ([place]) 01/22/2017  . Hyperlipidemia associated with type 2 diabetes mellitus ([place]) 01/22/2017  . Bipolar disorder ([place]) 01/22/2017  . Seizure ([place])   . Thoracic aortic atherosclerosis ([place]) 01/14/2017  . Coronary artery calcification seen on CAT scan 01/14/2017  . Acute delirium 01/14/2017  . Possible Tonic-clonic seizure disorder (  [place]) 01/14/2017  . CVA (cerebral vascular accident) ([place]) 01/14/2017  . Altered mental status 01/13/2017  . Mastalgia 04/20/2016  . Bilateral leg edema 02/10/2016  . Hearing difficulty 10/30/2015  . Midline low back pain with right-sided sciatica 09/25/2015  . Angina pectoris ([place]) 06/30/2015  . Dysarthria 02/14/2015  . Schizophrenia, unspecified type ([place]) 10/25/2014  . Type 2 diabetes mellitus with complication ([place])   . Cephalalgia   . Hx of systemic lupus erythematosus (SLE)   . Thyroid mass 04/26/2014  . Constipation 12/10/2013  . Benign neoplasm of colon 12/10/2013  . Controlled  type 2 diabetes with neuropathy ([place]) 11/27/2013  . Cystic kidney disease 11/09/2013  . Insulin dependent type 2 diabetes mellitus ([place]) 10/29/2013  . Hoarseness or changing voice 09/27/2013  . Dyskinesia, tardive 07/11/2013  . TMJ syndrome 02/16/2013  . Angina pectoris associated with type 2 diabetes mellitus ([place]) 08/08/2012  . CAD in native artery 08/08/2012  . Asthma, chronic, PRESUMED wiht MULTIFACTORIAL DYSPNEA 05/30/2012  . Insomnia 09/22/2011  . Mobility poor 02/02/2011  . Chronic kidney disease, stage III (moderate) ([place]) 05/13/2010  . Chronic combined systolic and diastolic CHF, NYHA class 2 ([place]) 03/31/2010  . COPD (chronic obstructive pulmonary disease) ([place]) 03/26/2010  . Morbid obesity ([place]) 02/27/2010  . Unstable gait 02/27/2010  . DM (diabetes mellitus), type 2 with neurological complications ([place]) 97/08/6376  . Osteoarthritis 04/25/2009  . Anemia 03/11/2007  . COGNITIVE IMPAIRMENT, MILD, SO STATED 03/11/2007  . RESTLESS LEG SYNDROME 03/11/2007  . Systemic lupus ([place]) 03/11/2007  . OSTEOARTHROSIS, GENERALIZED, MULTIPLE SITES 03/11/2007  . Hypothyroidism 03/07/2007  . DYSLIPIDEMIA 03/07/2007  . Depression 03/07/2007  . Essential hypertension 03/07/2007  . Coronary atherosclerosis 03/07/2007  . GERD 03/07/2007  . SLEEP APNEA 03/07/2007    Past Surgical History:  Procedure Laterality Date  . ANKLE ARTHROSCOPY WITH OPEN REDUCTION INTERNAL FIXATION (ORIF) Right 03/2011  . CARDIAC CATHETERIZATION N/A 07/01/2015   Procedure: Left Heart Cath and Coronary Angiography;  Surgeon: Adrian Prows, MD;  Location: [facility];  Service: Cardiovascular;  Laterality: N/A;  . CARDIAC CATHETERIZATION N/A 07/01/2015   Procedure: Intravascular Pressure Wire/FFR Study;  Surgeon: Adrian Prows, MD;  Location: [facility];  Service: Cardiovascular;  Laterality: N/A;  . CARPAL TUNNEL RELEASE Bilateral   . CATARACT EXTRACTION W/ INTRAOCULAR LENS  IMPLANT, BILATERAL Bilateral   .  CHOLECYSTECTOMY OPEN    . COLONOSCOPY N/A 12/10/2013   Procedure: COLONOSCOPY;  Surgeon: Inda Castle, MD;  Location: [facility];  Service: Endoscopy;  Laterality: N/A;  . FRACTURE SURGERY    . INCISION AND DRAINAGE ABSCESS Left    foot "under my little toe"  . KNEE LIGAMENT RECONSTRUCTION Right   . LEFT HEART CATHETERIZATION WITH CORONARY ANGIOGRAM N/A 08/08/2012   Procedure: LEFT HEART CATHETERIZATION WITH CORONARY ANGIOGRAM;  Surgeon: [name], MD;  Location[facility];  Service: Cardiovascular;  Laterality: N/A;  . NASAL SINUS SURGERY    . PERCUTANEOUS CORONARY STENT INTERVENTION (PCI-S) N/A 08/21/2012   Procedure: PERCUTANEOUS CORONARY STENT INTERVENTION (PCI-S);  Surgeon: [name], MD;  Location: [facility] CATH LAB;  Service: Cardiovascular;  Laterality: N/A;  . TUBAL LIGATION      OB History    No data available       Home Medications    Prior to Admission medications   Medication Sig Start Date End Date Taking? Authorizing Provider  acetaminophen (TYLENOL) 325 MG tablet Take 650 mg by mouth every 6 (six) hours as needed for  mild pain or moderate pain. 01/24/17  Yes [provider]  albuterol (PROAIR HFA) 108 (90 Base) MCG/ACT inhaler inhale 2 puffs by mouth every 4 hours if needed USE ONLY IF YOU ARE WHEEZING 05/13/16  Yes Hensel, Jamal Collin, MD  allopurinol (ZYLOPRIM) 100 MG tablet Take 1 tablet (100 mg total) by mouth daily. 07/29/17  Yes [name], [name], MD  amLODipine (NORVASC) 10 MG tablet Take 1 tablet (10 mg total) by mouth daily. 12/22/16  Yes Hensel, Jamal Collin, MD  Ascorbic Acid (VITAMIN C PO) Take 1 tablet by mouth daily.   Yes [provider]  aspirin EC 81 MG tablet Take 162 mg by mouth daily.    Yes [provider]  atorvastatin (LIPITOR) 20 MG tablet Take 1 tablet (20 mg total) by mouth daily. 04/12/17  Yes [name], [name], MD  B Complex Vitamins (VITAMIN B COMPLEX) TABS Take 1 tablet by mouth daily.   Yes [provider]  budesonide-formoterol (SYMBICORT) 160-4.5 MCG/ACT inhaler Inhale 2 puffs into the lungs 2 (two) times daily. 05/25/17  Yes [name], [name], MD  Cholecalciferol (VITAMIN D3) 5000 UNITS CAPS Take 5,000 Units by mouth daily.   Yes [provider]  Flaxseed, Linseed, (FLAXSEED OIL PO) Take 1 capsule by mouth daily.   Yes [provider]  furosemide (LASIX) 40 MG tablet Take 40 mg by mouth daily. 08/15/17  Yes [provider]  gabapentin (NEURONTIN) 300 MG capsule Take 1 capsule (300 mg total) by mouth 2 (two) times daily. 08/30/16  Yes Hensel, Jamal Collin, MD  guaiFENesin (ROBITUSSIN) 100 MG/5ML liquid Take 200 mg by mouth 3 (three) times daily as needed for cough.   Yes [provider]  Insulin NPH, Human,, Isophane, (HUMULIN N KWIKPEN) 100 UNIT/ML Kiwkpen Inject 30 Units into the skin every morning. 07/15/17  Yes [name], [name], MD  levETIRAcetam (KEPPRA) 500 MG tablet Take 1 tablet (500 mg total) by mouth 2 (two) times daily. 04/25/17  Yes Cameron Sprang, MD  linaclotide [facility]) 145 MCG CAPS capsule Take 1 capsule (145 mcg total) by mouth daily before breakfast. Patient taking differently: Take 145 mcg by mouth daily as needed (constipation).  07/19/17  Yes [name], [name], MD  metFORMIN (GLUCOPHAGE) 500 MG tablet take 1 tablet by mouth twice a day with meals for diabetes 08/15/17  Yes [name], [name], MD  metoprolol tartrate (LOPRESSOR) 50 MG tablet take 1 tablet by mouth once daily WITH OR IMMEDIATELY FOLLOWING A MEAL FOR HYPERTENSION 08/15/17  Yes [name], [name], MD  Multiple Vitamin (MULTIVITAMIN WITH MINERALS) TABS tablet Take 1 tablet by mouth daily.   Yes [provider]  nitroGLYCERIN (NITROSTAT) 0.4 MG SL tablet Place 1 tablet (0.4 mg total) under the tongue every 5 (five) minutes as needed for chest pain. 05/13/16  Yes Hensel, Jamal Collin, MD  pantoprazole (PROTONIX) 40 MG tablet Take 1 tablet (40 mg total) by mouth daily. 02/07/17  Yes Hensel, Jamal Collin, MD  Polyethyl Glycol-Propyl Glycol (SYSTANE ULTRA OP) Place 1 drop into both eyes every 6 (six) hours as needed (dry eyes).   Yes [provider]  ACCU-CHEK AVIVA PLUS test strip TEST three times a day 02/09/17   Zenia Resides, MD  colchicine 0.6 MG tablet 1 tab bid for 1 week then prn 2 tabs once when acute gout onset (no more than 2 tabs per day). 07/29/17   [name], [name], MD  Insulin Pen Needle (PEN NEEDLES 31GX5/16") 31G  X 8 MM MISC Check Blood sugar three times per day. Dx insulin using E11.8 07/15/17   [name], [name], MD  montelukast (SINGULAIR) 10 MG tablet Take 1 tablet (10 mg total) by mouth at bedtime. Patient not taking: Reported on 09/01/2017 07/19/17   [name], [name], MD    Family History Family History  Problem Relation Age of Onset  . Heart disease Mother   . Diabetes Mother   . Clotting disorder Mother   . Pneumonia Father   . Rheum arthritis Father   . Diabetes Sister   . Diabetes Sister   . Asthma Brother   . Cancer Brother   . Kidney disease Brother   . Lupus Son   . Heart disease Son   . Heart disease Daughter   . Colon cancer Neg Hx     Social History Social History   Tobacco Use  . Smoking status: Former Smoker    Packs/day: 3.00    Years: 40.00    Pack years: 120.00    Types: Cigarettes    Start date: 07/05/1960    Last attempt to quit: 07/05/1998    Years since quitting: 19.1  . Smokeless tobacco: Never Used  Substance Use Topics  . Alcohol use: No    Alcohol/week: 0.0 oz    Comment: 06/30/2015 "used to drink a little alcohol on the weekends; not q weekend; nothing in years"  . Drug use: No     Allergies   Ace inhibitors; Bidil [isosorb dinitrate-hydralazine]; Penicillins; Buspar [buspirone]; Pregabalin; Ropinirole hydrochloride; and Amantadines   Review of Systems Review of Systems  Constitutional: Negative for chills and fever.  Respiratory: Negative for chest tightness and shortness of breath.   Cardiovascular: Positive for  leg swelling. Negative for chest pain.  Gastrointestinal: Negative for abdominal pain, nausea and vomiting.  Musculoskeletal: Positive for arthralgias, gait problem, joint swelling and myalgias.  Skin: Positive for color change.  All other systems reviewed and are negative.    Physical Exam Updated Vital Signs BP (!) 155/78   Pulse 75   Temp 98.7 F (37.1 C) (Oral)   Resp 16   SpO2 96%   Physical Exam  Constitutional: She appears well-developed and well-nourished. No distress.  HENT:  Head: Normocephalic and atraumatic.  Mouth/Throat: Oropharynx is clear and moist.  Eyes: Conjunctivae and EOM are normal. Pupils are equal, round, and reactive to light.  Neck: Normal range of motion. Neck supple.  Cardiovascular: Normal rate, regular rhythm, S1 normal and S2 normal.  No murmur heard. Pulmonary/Chest: Effort normal and breath sounds normal. She has no wheezes. She has no rales.  Abdominal: She exhibits no distension.  Musculoskeletal: Normal range of motion. She exhibits no edema or deformity.  Left ankle exhibits erythema and edema of the lateral malleolus.  Lateral malleolus tender to palpation.  Left calf with tenderness but no obvious streaking or erythema.  Patient is able to perform flexion and extension, but minimize due to pain in the left ankle.  Neurological: She is alert.  Cranial nerves grossly intact. Patient moves extremities symmetrically and with good coordination.  Skin: Skin is warm and dry. No rash noted. No erythema.  Psychiatric: She has a normal mood and affect. Her behavior is normal. Judgment and thought content normal.  Nursing note and vitals reviewed.    ED Treatments / Results  Labs (all labs ordered are listed, but only abnormal results are displayed) Labs Reviewed  BASIC METABOLIC PANEL  CBC WITH DIFFERENTIAL/PLATELET  Patient is  nontoxic-appearing, afebrile, in no acute distress at this time. EKG  EKG Interpretation None        Radiology No results found.  Procedures Procedures (including critical care time)  Medications Ordered in ED Medications  HYDROcodone-acetaminophen (NORCO/VICODIN) 5-325 MG per tablet 1 tablet (not administered)     Initial Impression / Assessment and Plan / ED Course  I have reviewed the triage vital signs and the nursing notes.  Pertinent labs & imaging results that were available during my care of the patient were reviewed by me and considered in my medical decision making (see chart for details).  Clinical Course as of Sep 01 1626  Thu Sep 01, 1957  2934 77 year old female with left lateral ankle pain for a couple of weeks.  She is a believe seen her doctor for it and the thought was it might be gout because she has had gout in the toe before.  It is unusual and that it is not tender on the medial side of the joint and the she is able to range of motion gently so I doubt septic or gout.  There is at least some overlying erythema and warmth and she is very tender on that lateral side of the so she probably needs to be covered for infection.  [MB]  1324 Patient reevaluated.  Patient is able to stand up, but not bear any weight or ambulate onto the left ankle.  [AM]    Clinical Course User Index [AM] Albesa Seen, PA-C [MB] Hayden Rasmussen, MD    Patient is nontoxic-appearing, afebrile, and in no acute distress at this time.  Differential diagnosis includes gout of left ankle, cellulitis, septic arthritis, DVT.  Given that patient is able to flex and extend the left ankle, doubt septic arthritis.  Will obtain CBC, BMP, LE venous.  Norco for pain control.  Do have concerns about patient's ability to ambulate at home.  Informed personally by ultrasound technician that there is no evidence of DVT in the left lower extremity.  No bony abnormality of the left ankle as evidenced on x-ray.  Patient demonstrating leukocytosis with left shift.  Patient also exhibits acute on  chronic worsening renal function.  See below for creatinine trending.   Lab Results  Component Value Date   CREATININE 1.82 (H) 09/01/2017   CREATININE 1.41 (H) 07/19/2017   CREATININE 1.76 (H) 05/25/2017   As patient is tolerating oral, will order doxycycline. Due to inability to ambulate in the setting of likely developing cellulitis of the left lower extremity and inability to care for herself at home, will seek admission for patient.  Spoke with Dr. Arlyce Dice of [facility] hospitalists, who will admit the patient.  This is a shared visit with Dr. Aletta Edouard. Patient was independently evaluated by this attending physician. Attending physician consulted in evaluation and admission management.  Final Clinical Impressions(s) / ED Diagnoses   Final diagnoses:  Acute left ankle pain  Left ankle swelling    ED Discharge Orders    None       Tamala Julian 09/01/17 1928    Hayden Rasmussen, MD 09/03/17 [id number]

## 2017-09-01 NOTE — ED Notes (Signed)
Hospitalist at bedside 

## 2017-09-01 NOTE — ED Triage Notes (Signed)
Per [facility] patient from home for worsening leg pain today when trying to get out of bed. Patient has issues with gout in bilat legs but this morning left leg was weak and patient lowered self to the ground and unable to get back up by herself. Patient lives alone.

## 2017-09-01 NOTE — ED Notes (Signed)
Brittany Roberts [phone] cell

## 2017-09-02 ENCOUNTER — Other Ambulatory Visit: Payer: Self-pay

## 2017-09-02 DIAGNOSIS — M109 Gout, unspecified: Secondary | ICD-10-CM

## 2017-09-02 DIAGNOSIS — N179 Acute kidney failure, unspecified: Secondary | ICD-10-CM | POA: Diagnosis not present

## 2017-09-02 LAB — BASIC METABOLIC PANEL
Anion gap: 9 (ref 5–15)
BUN: 36 mg/dL — ABNORMAL HIGH (ref 6–20)
CO2: 22 mmol/L (ref 22–32)
Calcium: 8.6 mg/dL — ABNORMAL LOW (ref 8.9–10.3)
Chloride: 111 mmol/L (ref 101–111)
Creatinine, Ser: 1.82 mg/dL — ABNORMAL HIGH (ref 0.44–1.00)
GFR calc Af Amer: 30 mL/min — ABNORMAL LOW (ref >60)
GFR calc non Af Amer: 26 mL/min — ABNORMAL LOW (ref >60)
Glucose, Bld: 115 mg/dL — ABNORMAL HIGH (ref 65–99)
Potassium: 3.8 mmol/L (ref 3.5–5.1)
Sodium: 142 mmol/L (ref 135–145)

## 2017-09-02 LAB — URINALYSIS, ROUTINE W REFLEX MICROSCOPIC
Bilirubin Urine: NEGATIVE
Glucose, UA: NEGATIVE mg/dL
Hgb urine dipstick: NEGATIVE
Ketones, ur: NEGATIVE mg/dL
Leukocytes, UA: NEGATIVE
Nitrite: NEGATIVE
Protein, ur: NEGATIVE mg/dL
Specific Gravity, Urine: 1.011 (ref 1.005–1.030)
pH: 5 (ref 5.0–8.0)

## 2017-09-02 LAB — CBC
HCT: 29.7 % — ABNORMAL LOW (ref 36.0–46.0)
Hemoglobin: 10 g/dL — ABNORMAL LOW (ref 12.0–15.0)
MCH: 23.9 pg — ABNORMAL LOW (ref 26.0–34.0)
MCHC: 33.7 g/dL (ref 30.0–36.0)
MCV: 71.1 fL — ABNORMAL LOW (ref 78.0–100.0)
Platelets: 180 10*3/uL (ref 150–400)
RBC: 4.18 MIL/uL (ref 3.87–5.11)
RDW: 14.9 % (ref 11.5–15.5)
WBC: 9.7 10*3/uL (ref 4.0–10.5)

## 2017-09-02 LAB — GLUCOSE, CAPILLARY
Glucose-Capillary: 102 mg/dL — ABNORMAL HIGH (ref 65–99)
Glucose-Capillary: 103 mg/dL — ABNORMAL HIGH (ref 65–99)

## 2017-09-02 MED ORDER — FUROSEMIDE 40 MG PO TABS: 40.0000 mg | ORAL_TABLET | Freq: Every day | ORAL | 0 refills | Status: DC

## 2017-09-02 MED ORDER — ORAL CARE MOUTH RINSE
15.0000 mL | Freq: Two times a day (BID) | OROMUCOSAL | Status: DC
  Administered 2017-09-02: 15 mL via OROMUCOSAL

## 2017-09-02 MED ORDER — CHLORHEXIDINE GLUCONATE 0.12 % MT SOLN
15.0000 mL | Freq: Two times a day (BID) | OROMUCOSAL | Status: DC
  Administered 2017-09-02: 15 mL via OROMUCOSAL
  Filled 2017-09-02: qty 15

## 2017-09-02 NOTE — Discharge Summary (Addendum)
Physician Discharge Summary  Brittany Roberts PNP:[id number] DOB: 19-Jun-1941 DOA: 09/01/2017  PCP: [name], [name], MD  Admit date: 09/01/2017 Discharge date: 09/02/2017  Time spent: over 30 minutes  Recommendations for Outpatient Follow-up:  1. Follow up outpatient CBC/CMP within 2-3 days 2. Follow up L ankle pain.  She's improved with colchicine, suggesting gout, but if she does not continue to improve, would consider arthrocentesis or further imaging.  Low suspicion for infection at this point in time.  Continue colchine as previously prescribed (BID x 1 week and then follow up with PCP for long term plan for colchicine).  Continue allopurinol. 3. Creatinine a bit elevated today, but has been in this range before in November of last year.  Received IVF here.  UA clean.  Hold lasix x2 days, then resume at previous dose.  Follow up outpatient creatinine.  Follow up with PCP regarding continue lasix dosing.  Weigh patient daily while holding lasix, if weight increases more than 2-3 lbs in 1 day, resume lasix.  Pt decided not to go home with SNF, couldn't find one she liked.  Discussed after this decision was made and ensured discharge instructions clear to pt and family.  Her ankle had improved even further this afternoon, which is reassuring.   Discharge Diagnoses:  Principal Problem:   AKI (acute kidney injury) ([place]) Active Problems:   Hypothyroidism   DM (diabetes mellitus), type 2 with neurological complications ([place])   Essential hypertension   Chronic kidney disease, stage III (moderate) ([place])   Schizophrenia, unspecified type ([place])   Gout attack   Discharge Condition: stable  Diet recommendation: heart healthy, consistent carb  Filed Weights   09/01/17 2117  Weight: 122.4 kg (269 lb 13.5 oz)    History of present illness:  Per HPI Brittany Roberts is a 77 y.o. female with medical history significant for gout, depression, diastolic and systolic CHF, COPD, CKD3, asthma, SLE, schizophrenia  bipolar disorder, seizure disorder, presented to the ED today with complaints of left leg pain swelling redness now of 3 days duration. patient has a history of gout but usually gout symptoms on left big toe.  She endorses compliance with allopurinol, and continued Lasix. she did not take her as needed colchicine for this pain.   Patient denies fever or chills, no nausea or vomiting.   Patient lives alone, patient reports for the past 2 days, she has not been able to ambulate or bear weight, preparing her meals.  Reports she fell this morning when she tried to bear weight, but did not hit her head.  She was on the floor for at least an hour. She reports multiple episodes of loose stools the past week, none bloody, at least 5 times daily, but none in the past 24 hours.  No abdominal pain, recent antibiotic prescriptions.   Her symptoms were improved on day of discharge with colchicine only.  Plan to discharge to SNF with close follow up.   Hospital Course:  AKI on CKD3-  Cr- 1.8 at presentation, baseline 1.3-1.5.  Thought 2/2 hypovolemia.  Stable on day of discharge.  UA without protein or hematuria.  Hold lasix for 2 additional days after discharge, then resume.  Follow up creatinine as outpatient.  Follow up with PCP to determine long term plan for lasix.    Left ankle pain likely gout- inability to ambulate on presentation.  Seems significantly improved this morning after receiving colchicine for gout, but still with pain.  Has pain to lateral  L ankle, minimal redness and swelling and decreased ROM.  Given improvement, will plan to discharge on colchicine.  Continue allopurinol.  Colchicine as previously prescribed BID x 1 week then follow up with PCP regarding colchicine dosing.  If patient does not continue to improve or develops worsening swelling, fever, redness or signs of infection, would recommend joint aspiration and/or further imaging. - Colchicine 0.6mg  BID x 1 week (follow up with PCP  regarding long term dosing after complete) - PT eval recommending SNF - Holding NSAIDS with AKI and CKD. - Cont Home allopurinol she has been compliant - Exam not suggestive of infection, holding abx.   DM-Home medications NPH 30 daily, metformin 025 bid  CHF-diastolic and systolic.-Appears hypovolemic -Hold home lasix 2 days as above, then resume - follow up with PCP regarding long term plan for lasix  Procedures: Final Interpretation: Right: No evidence of common femoral vein obstruction. Left: There is no evidence of deep vein thrombosis in the lower extremity.There is no evidence of superficial venous thrombosis. No cystic structure found in the popliteal fossa.  Consultations:  none  Discharge Exam: Vitals:   09/02/17 0421 09/02/17 0856  BP: 122/64   Pulse: 68   Resp: 14   Temp: 98.7 F (37.1 C)   SpO2: 95% 94%   Feeling better.  Still with LE pain, but able to bear some weight now.   General exam: Appears calm and comfortable  Respiratory system: Clear to auscultation. Respiratory effort normal. Cardiovascular system: S1 & S2 heard, RRR. No JVD, murmurs, rubs, gallops or clicks. No pedal edema. Gastrointestinal system: Abdomen is nondistended, soft and nontender. No organomegaly or masses felt. Normal bowel sounds heard. Central nervous system: Alert and oriented. No focal neurological deficits. Extremities: Very Mild erythema to L ankle, able to range ankle, but decreased compared to R.  TTP over lateral mal on R.   Psychiatry: Judgement and insight appear normal. Mood & affect appropriate.   Discharge Instructions   Discharge Instructions    Call MD for:  difficulty breathing, headache or visual disturbances   Complete by:  As directed    Call MD for:  persistant dizziness or light-headedness   Complete by:  As directed    Call MD for:  redness, tenderness, or signs of infection (pain, swelling, redness, odor or green/yellow discharge around incision site)    Complete by:  As directed    Call MD for:  severe uncontrolled pain   Complete by:  As directed    Call MD for:  temperature >100.4   Complete by:  As directed    Diet - low sodium heart healthy   Complete by:  As directed    Discharge instructions   Complete by:  As directed    You were seen for a suspected gout flare.  Continue the allopurinol as prescribed.  Continue colchicine twice daily for 1 week as previously prescribed then follow up with your PCP regarding plan for continued colchine.   Your kidney function was slightly elevated, but close to your baseline.  Stop your lasix for about 2 days, then resume it as previously prescribed.  Please follow up with your PCP closely to determine whether to continue to take this consistently long term.  Check your weight daily during this time (if you gain more than 2-3 lbs in a day while holding lasix, resume your lasix).     Please follow up with your PCP.  Return if you have new, recurrent, or worsening symptoms.  Please ask your PCP to request records from this hospitalization so they know what was done and what the next steps are.   Increase activity slowly   Complete by:  As directed      Allergies as of 09/02/2017      Reactions   Ace Inhibitors Other (See Comments)   Coincided with sig bump in creat. Retried and creat bumped again.   Bidil [isosorb Dinitrate-hydralazine] Other (See Comments)   Sleep all the time.   Penicillins Anaphylaxis, Swelling, Rash   Has patient had a PCN reaction causing immediate rash, facial/tongue/throat swelling, SOB or lightheadedness with hypotension: Yes Has patient had a PCN reaction causing severe rash involving mucus membranes or skin necrosis: Yes Has patient had a PCN reaction that required hospitalization: Yes Has patient had a PCN reaction occurring within the last 10 years: No If all of the above answers are "NO", then may proceed with Cephalosporin use.   Buspar [buspirone] Other (See  Comments)   pain   Pregabalin Swelling   Ropinirole Hydrochloride Swelling   Amantadines Rash   "Swelling of the tongue"      Medication List    TAKE these medications   ACCU-CHEK AVIVA PLUS test strip Generic drug:  glucose blood TEST three times a day   acetaminophen 325 MG tablet Commonly known as:  TYLENOL Take 650 mg by mouth every 6 (six) hours as needed for mild pain or moderate pain.   albuterol 108 (90 Base) MCG/ACT inhaler Commonly known as:  PROAIR HFA inhale 2 puffs by mouth every 4 hours if needed USE ONLY IF YOU ARE WHEEZING   allopurinol 100 MG tablet Commonly known as:  ZYLOPRIM Take 1 tablet (100 mg total) by mouth daily.   amLODipine 10 MG tablet Commonly known as:  NORVASC Take 1 tablet (10 mg total) by mouth daily.   aspirin EC 81 MG tablet Take 162 mg by mouth daily.   atorvastatin 20 MG tablet Commonly known as:  LIPITOR Take 1 tablet (20 mg total) by mouth daily.   budesonide-formoterol 160-4.5 MCG/ACT inhaler Commonly known as:  SYMBICORT Inhale 2 puffs into the lungs 2 (two) times daily.   colchicine 0.6 MG tablet 1 tab bid for 1 week then prn 2 tabs once when acute gout onset (no more than 2 tabs per day).   FLAXSEED OIL PO Take 1 capsule by mouth daily.   furosemide 40 MG tablet Commonly known as:  LASIX Take 1 tablet (40 mg total) by mouth daily. (hold this for 2 days, then resume.  Please follow up with PCP for repeat labs and long term plan) What changed:  additional instructions   gabapentin 300 MG capsule Commonly known as:  NEURONTIN Take 1 capsule (300 mg total) by mouth 2 (two) times daily.   guaiFENesin 100 MG/5ML liquid Commonly known as:  ROBITUSSIN Take 200 mg by mouth 3 (three) times daily as needed for cough.   Insulin NPH (Human) (Isophane) 100 UNIT/ML Kiwkpen Commonly known as:  HUMULIN N KWIKPEN Inject 30 Units into the skin every morning.   levETIRAcetam 500 MG tablet Commonly known as:  KEPPRA Take 1  tablet (500 mg total) by mouth 2 (two) times daily.   linaclotide 145 MCG Caps capsule Commonly known as:  LINZESS Take 1 capsule (145 mcg total) by mouth daily before breakfast. What changed:    when to take this  reasons to take this   metFORMIN 500 MG tablet Commonly known as:  GLUCOPHAGE  take 1 tablet by mouth twice a day with meals for diabetes   metoprolol tartrate 50 MG tablet Commonly known as:  LOPRESSOR take 1 tablet by mouth once daily WITH OR IMMEDIATELY FOLLOWING A MEAL FOR HYPERTENSION   montelukast 10 MG tablet Commonly known as:  SINGULAIR Take 1 tablet (10 mg total) by mouth at bedtime.   multivitamin with minerals Tabs tablet Take 1 tablet by mouth daily.   nitroGLYCERIN 0.4 MG SL tablet Commonly known as:  NITROSTAT Place 1 tablet (0.4 mg total) under the tongue every 5 (five) minutes as needed for chest pain.   pantoprazole 40 MG tablet Commonly known as:  PROTONIX Take 1 tablet (40 mg total) by mouth daily.   PEN NEEDLES 31GX5/16" 31G X 8 MM Misc Check Blood sugar three times per day. Dx insulin using E11.8   SYSTANE ULTRA OP Place 1 drop into both eyes every 6 (six) hours as needed (dry eyes).   Vitamin B Complex Tabs Take 1 tablet by mouth daily.   VITAMIN C PO Take 1 tablet by mouth daily.   Vitamin D3 5000 units Caps Take 5,000 Units by mouth daily.      Allergies  Allergen Reactions  . Ace Inhibitors Other (See Comments)    Coincided with sig bump in creat. Retried and creat bumped again.  Beulah Gandy [Isosorb Dinitrate-Hydralazine] Other (See Comments)    Sleep all the time.  Marland Kitchen Penicillins Anaphylaxis, Swelling and Rash    Has patient had a PCN reaction causing immediate rash, facial/tongue/throat swelling, SOB or lightheadedness with hypotension: Yes Has patient had a PCN reaction causing severe rash involving mucus membranes or skin necrosis: Yes Has patient had a PCN reaction that required hospitalization: Yes Has patient had a  PCN reaction occurring within the last 10 years: No If all of the above answers are "NO", then may proceed with Cephalosporin use.   Ebbie Ridge [Buspirone] Other (See Comments)    pain  . Pregabalin Swelling  . Ropinirole Hydrochloride Swelling  . Amantadines Rash    "Swelling of the tongue"   Contact information for after-discharge care    Destination    [facility] .   Service:  Skilled Nursing Contact information: [address] [place] [phone]               The results of significant diagnostics from this hospitalization (including imaging, microbiology, ancillary and laboratory) are listed below for reference.    Significant Diagnostic Studies: Dg Ankle Complete Left  Result Date: 09/01/2017 CLINICAL DATA:  Ankle pain with tenderness EXAM: LEFT ANKLE COMPLETE - 3+ VIEW COMPARISON:  None. FINDINGS: No acute displaced fracture or malalignment. The ankle mortise is symmetric. Mild degenerative changes medially. Mild vascular calcifications. IMPRESSION: No acute osseous abnormality. Electronically Signed   By: Donavan Foil M.D.   On: 09/01/2017 17:05    Microbiology: No results found for this or any previous visit (from the past 240 hour(s)).   Labs: Basic Metabolic Panel: Recent Labs  Lab 09/01/17 1454 09/02/17 0403  NA 143 142  K 4.1 3.8  CL 111 111  CO2 23 22  GLUCOSE 133* 115*  BUN 35* 36*  CREATININE 1.82* 1.82*  CALCIUM 9.1 8.6*   Liver Function Tests: No results for input(s): AST, ALT, ALKPHOS, BILITOT, PROT, ALBUMIN in the last 168 hours. No results for input(s): LIPASE, AMYLASE in the last 168 hours. No results for input(s): AMMONIA in the last 168  hours. CBC: Recent Labs  Lab 09/01/17 1454 09/02/17 0403  WBC 13.2* 9.7  NEUTROABS 9.5*  --   HGB 11.4* 10.0*  HCT 33.1* 29.7*  MCV 71.3* 71.1*  PLT 187 180   Cardiac Enzymes: Recent Labs  Lab 09/01/17 1453  CKTOTAL 360*    BNP: BNP (last 3 results) No results for input(s): BNP in the last 8760 hours.  ProBNP (last 3 results) No results for input(s): PROBNP in the last 8760 hours.  CBG: Recent Labs  Lab 09/01/17 2204 09/02/17 0730 09/02/17 1210  GLUCAP 120* 102* 103*       Signed:  Fayrene Helper MD.  [facility] 09/02/2017, 2:33 PM

## 2017-09-02 NOTE — Clinical Social Work Note (Signed)
Clinical Social Work Assessment  Patient Details  Name: Brittany Roberts MRN: [id number] Date of Birth: Aug 01, 1940  Date of referral:  09/02/17               Reason for consult:  Facility Placement                Permission sought to share information with:    Permission granted to share information::     Name::        Agency::     Relationship::     Contact Information:     Housing/Transportation Living arrangements for the past 2 months:  Apartment Source of Information:  Patient Patient Interpreter Needed:  None Criminal Activity/Legal Involvement Pertinent to Current Situation/Hospitalization:  No - Comment as needed Significant Relationships:  Adult Children, Community Support Lives with:  Self Do you feel safe going back to the place where you live?  Yes Need for family participation in patient care:  No (Comment)  Care giving concerns:  Pt lives at home alone and has a CNA who comes to assist her for 2-3 hours daily. States her main issue at home is that "she forgets to take her medications at night." Prior to admission was ambulating around the house independently with a walker. She states she fell in her home prior to admission and is concerned about falling again.    Social Worker assessment / plan:  CSW consulted to assist with SNF placement.  Met with pt at bedside- she was alert/oriented x 4. States she understands SNF recommendation due to having been to [facility] and [facility] in the past for rehab following injuries/illness. Last time was fall 2018. Completed FL2 and made referrals. [facility] selected by pt and facility initiating insurance authorization request.  Spent some time processing pt's difficulty remembering to take her nightly medications. She states that her children call her nightly to remind her to take it. CSW and pt discussed other ways to ensure her remembering- setting an alarm, having CNA assist with preparing medications and putting them beside her  bed at night.   Plan: [facility] SNF for [place] rehab at [facility]. Awaiting stability for DC and insurance authorization.    Employment status:  Retired Engineer, maintenance (IT)) PT Recommendations:  [place] / Referral to community resources:  [place]  Patient/Family's Response to care:  Pt very receptive and appreciative  Patient/Family's Understanding of and Emotional Response to Diagnosis, Current Treatment, and Prognosis:  Pt demonstrates good understanding of her treatment and plan. Acknowledges her barrier to compliance with medication adherence is her "not remembering it when I have other things going on." reports she feels positive following conversation with CSW.  Emotional Assessment Appearance:  Appears stated age Attitude/Demeanor/Rapport:  Engaged Affect (typically observed):  Calm, Adaptable, Accepting Orientation:  Oriented to Self, Oriented to Place, Oriented to  Time, Oriented to Situation Alcohol / Substance use:  Not Applicable Psych involvement (Current and /or in the community):  No (Comment)  Discharge Needs  Concerns to be addressed:  Care Coordination, Discharge Planning Concerns Readmission within the last 30 days:  No Current discharge risk:  Dependent with Mobility Barriers to Discharge:  No Barriers Identified   Nila Nephew, LCSW 09/02/2017, 1:12 PM  [phone]

## 2017-09-02 NOTE — Progress Notes (Signed)
   09/02/17 1400  Clinical Encounter Type  Visited With Patient  Visit Type Initial;Psychological support;Spiritual support  Referral From Nurse  Consult/Referral To Chaplain  Spiritual Encounters  Spiritual Needs Brochure;Other (Comment) (Advance Directive )  Stress Factors  Patient Stress Factors Other (Comment) (Advance Directive )  Advance Directives (For Healthcare)  Does Patient Have a Medical Advance Directive? No  Does patient want to make changes to medical advance directive? Yes (Inpatient - patient requests chaplain consult to change a medical advance directive)  Type of Advance Directive [place]  Would patient like information on creating a medical advance directive? Yes (Inpatient - patient requests chaplain consult to create a medical advance directive) (Information Given To Patient )   I visited with the patient per [place] for an Advance Directive.  I provided the patient with the document. Please, contact Spiritual Care when patient is ready to complete.   Chaplain Shanon Ace M.Div., [facility]

## 2017-09-02 NOTE — Progress Notes (Signed)
Met with pt to coordinate DC to SNF. Pt's daughters in room, discussed with her and she now reports she will not agree to go to SNF and "wants to just go back home." States her CNA there will help her. Declines CSW offer of any home health services.  Daughters in room state they will transport pt home.   , MSW, LCSW Clinical Social Work 09/02/2017 [phone]   

## 2017-09-02 NOTE — NC FL2 (Signed)
[place] LEVEL OF CARE SCREENING TOOL     IDENTIFICATION  Patient Name: Brittany Roberts Birthdate: 1941-03-29 Sex: female Admission Date (Current Location): 09/01/2017  [facility] and [place] Number:  Herbalist and Address:  [facility],  [place] [facility] [place], [place]      Provider Number: [id number]  Attending Physician Name and Address:  Elodia Florence., *  Relative Name and Phone Number:       Current Level of Care: Hospital Recommended Level of Care: [place] Prior Approval Number:    Date Approved/Denied:   PASRR Number: [id number]  Discharge Plan: SNF    Current Diagnoses: Patient Active Problem List   Diagnosis Date Noted  . AKI (acute kidney injury) ([place]) 09/01/2017  . Gout attack 09/01/2017  . Localization-related symptomatic epilepsy and epileptic syndromes with complex partial seizures, not intractable, without status epilepticus ([place]) 04/25/2017  . Fibromyalgia 04/08/2017  . Allergic rhinitis 03/11/2017  . Cardiomyopathy ([place]) 01/22/2017  . Hyperlipidemia associated with type 2 diabetes mellitus ([place]) 01/22/2017  . Bipolar disorder ([place]) 01/22/2017  . Seizure ([place])   . Thoracic aortic atherosclerosis ([place]) 01/14/2017  . Coronary artery calcification seen on CAT scan 01/14/2017  . Acute delirium 01/14/2017  . Possible Tonic-clonic seizure disorder ([place]) 01/14/2017  . CVA (cerebral vascular accident) ([place]) 01/14/2017  . Altered mental status 01/13/2017  . Mastalgia 04/20/2016  . Bilateral leg edema 02/10/2016  . Hearing difficulty 10/30/2015  . Midline low back pain with right-sided sciatica 09/25/2015  . Angina pectoris ([place]) 06/30/2015  . Dysarthria 02/14/2015  . Schizophrenia, unspecified type ([place]) 10/25/2014  . Type 2 diabetes mellitus with complication ([place])   . Cephalalgia   . Hx of systemic lupus erythematosus (SLE)   . Thyroid mass 04/26/2014  . Constipation 12/10/2013  . Benign  neoplasm of colon 12/10/2013  . Controlled type 2 diabetes with neuropathy ([place]) 11/27/2013  . Cystic kidney disease 11/09/2013  . Insulin dependent type 2 diabetes mellitus ([place]) 10/29/2013  . Hoarseness or changing voice 09/27/2013  . Dyskinesia, tardive 07/11/2013  . TMJ syndrome 02/16/2013  . Angina pectoris associated with type 2 diabetes mellitus ([place]) 08/08/2012  . CAD in native artery 08/08/2012  . Asthma, chronic, PRESUMED wiht MULTIFACTORIAL DYSPNEA 05/30/2012  . Insomnia 09/22/2011  . Mobility poor 02/02/2011  . Chronic kidney disease, stage III (moderate) ([place]) 05/13/2010  . Chronic combined systolic and diastolic CHF, NYHA class 2 ([place]) 03/31/2010  . COPD (chronic obstructive pulmonary disease) ([place]) 03/26/2010  . Morbid obesity ([place]) 02/27/2010  . Unstable gait 02/27/2010  . DM (diabetes mellitus), type 2 with neurological complications ([place]) 44/31/5400  . Osteoarthritis 04/25/2009  . Anemia 03/11/2007  . COGNITIVE IMPAIRMENT, MILD, SO STATED 03/11/2007  . RESTLESS LEG SYNDROME 03/11/2007  . Systemic lupus ([place]) 03/11/2007  . OSTEOARTHROSIS, GENERALIZED, MULTIPLE SITES 03/11/2007  . Hypothyroidism 03/07/2007  . DYSLIPIDEMIA 03/07/2007  . Depression 03/07/2007  . Essential hypertension 03/07/2007  . Coronary atherosclerosis 03/07/2007  . GERD 03/07/2007  . SLEEP APNEA 03/07/2007    Orientation RESPIRATION BLADDER Height & Weight     Self, Time, Situation  Normal Continent, External catheter Weight: 269 lb 13.5 oz (122.4 kg) Height:  5\' 3"  (160 cm)  BEHAVIORAL SYMPTOMS/MOOD NEUROLOGICAL BOWEL NUTRITION STATUS      Continent Diet(carb modified, heart healthy diet)  AMBULATORY STATUS COMMUNICATION OF NEEDS Skin   Extensive Assist Verbally Normal  Personal Care Assistance Level of Assistance  Bathing, Feeding, Dressing Bathing Assistance: Limited assistance Feeding assistance: Independent Dressing Assistance: Limited assistance      Functional Limitations Info  Sight, Hearing, Speech Sight Info: Adequate Hearing Info: Adequate Speech Info: Adequate    SPECIAL CARE FACTORS FREQUENCY  PT (By licensed PT), OT (By licensed OT)     PT Frequency: 5x OT Frequency: 5x            Contractures Contractures Info: Not present    Additional Factors Info  Code Status, Allergies Code Status Info: DNR Allergies Info: Ace Inhibitors, Bidil Isosorb Dinitrate-hydralazine, Penicillins, Buspar Buspirone, Pregabalin, Ropinirole Hydrochloride, Amantadines           Current Medications (09/02/2017):  This is the current hospital active medication list Current Facility-Administered Medications  Medication Dose Route Frequency Provider Last Rate Last Dose  . albuterol (PROVENTIL) (2.5 MG/3ML) 0.083% nebulizer solution 3 mL  3 mL Inhalation Q6H PRN Emokpae, Ejiroghene E, MD      . allopurinol (ZYLOPRIM) tablet 100 mg  100 mg Oral Daily Emokpae, Ejiroghene E, MD      . amLODipine (NORVASC) tablet 10 mg  10 mg Oral Daily Emokpae, Ejiroghene E, MD      . aspirin EC tablet 162 mg  162 mg Oral Daily Emokpae, Ejiroghene E, MD   162 mg at 09/01/17 2254  . atorvastatin (LIPITOR) tablet 20 mg  20 mg Oral Daily Emokpae, Ejiroghene E, MD   20 mg at 09/01/17 2254  . chlorhexidine (PERIDEX) 0.12 % solution 15 mL  15 mL Mouth Rinse BID Emokpae, Ejiroghene E, MD      . colchicine tablet 0.6 mg  0.6 mg Oral BID Emokpae, Ejiroghene E, MD      . enoxaparin (LOVENOX) injection 40 mg  40 mg Subcutaneous Q24H Emokpae, Ejiroghene E, MD      . gabapentin (NEURONTIN) capsule 300 mg  300 mg Oral BID Emokpae, Ejiroghene E, MD   300 mg at 09/01/17 2254  . HYDROcodone-acetaminophen (NORCO/VICODIN) 5-325 MG per tablet 1 tablet  1 tablet Oral Q4H PRN Emokpae, Ejiroghene E, MD   1 tablet at 09/01/17 2315  . insulin aspart (novoLOG) injection 0-9 Units  0-9 Units Subcutaneous TID WC Emokpae, Ejiroghene E, MD      . insulin NPH Human (HUMULIN N,NOVOLIN N)  injection 20 Units  20 Units Subcutaneous QAC breakfast Emokpae, Ejiroghene E, MD   20 Units at 09/02/17 0747  . levETIRAcetam (KEPPRA) tablet 500 mg  500 mg Oral BID Emokpae, Ejiroghene E, MD   500 mg at 09/01/17 2254  . MEDLINE mouth rinse  15 mL Mouth Rinse q12n4p Emokpae, Ejiroghene E, MD      . metoprolol tartrate (LOPRESSOR) tablet 50 mg  50 mg Oral Daily Emokpae, Ejiroghene E, MD   50 mg at 09/01/17 2255  . mometasone-formoterol (DULERA) 200-5 MCG/ACT inhaler 2 puff  2 puff Inhalation BID Emokpae, Ejiroghene E, MD   2 puff at 09/02/17 0856  . pantoprazole (PROTONIX) EC tablet 40 mg  40 mg Oral Daily Emokpae, Ejiroghene E, MD   40 mg at 09/01/17 2254     Discharge Medications: Please see discharge summary for a list of discharge medications.  Relevant Imaging Results:  Relevant Lab Results:   Additional Information SS#: [id number]  Nila Nephew, LCSW

## 2017-09-02 NOTE — Care Management Obs Status (Signed)
[place] NOTIFICATION   Patient Details  Name: Brittany Roberts MRN: [id number] Date of Birth: 04/14/1941   Medicare Observation Status Notification Given:  Yes    Guadalupe Maple, RN 09/02/2017, 12:17 PM

## 2017-09-02 NOTE — Evaluation (Signed)
Physical Therapy Evaluation Patient Details Name: Brittany Roberts MRN: [id number] DOB: 07-20-1940 Today's Date: 09/02/2017   History of Present Illness  Brittany Roberts is a 77 y.o. female with medical history significant for gout, depression, diastolic and systolic CHF, COPD, CKD3, asthma, SLE, schizophrenia bipolar disorder, seizure disorder, presented to the ED 09/01/17 y with complaints of left leg pain swelling redness , fall.  Clinical Impression  The patient is very pleasant and pleased that she could stand and bear a little weight on the left foot. The patient requires extensive assistance today for transfers from bed to [facility] to Recliner and unable to tolerate weight bearing enough  to ambulate this visit.  PTA, patient was independent in apartment with or without rollator. Pt admitted with above diagnosis. Pt currently with functional limitations due to the deficits listed below (see PT Problem List).  Pt will benefit from skilled PT to increase their independence and safety with mobility to allow discharge to the venue listed below.       Follow Up Recommendations SNF -patient agreeable.   Equipment Recommendations  None recommended by PT    Recommendations for Other Services       Precautions / Restrictions Precautions Precautions: Fall      Mobility  Bed Mobility Overal bed mobility: Needs Assistance Bed Mobility: Supine to Sit     Supine to sit: Min assist;HOB elevated     General bed mobility comments: use of rail and trunk  Transfers Overall transfer level: Needs assistance Equipment used: Rolling walker (2 wheeled) Transfers: Sit to/from [facility] Sit to Stand: Mod assist Stand pivot transfers: Mod assist       General transfer comment: bed raised to partially stand and pivot to [facility], then stand and pivot to recliner using RW for stability. Extra time to rise and pivot.Steady assist for safety. Left leg tends to be out to left and not within RW  confines.   Ambulation/Gait                Stairs            Wheelchair Mobility    Modified Rankin (Stroke Patients Only)       Balance Overall balance assessment: Needs assistance;History of Falls Sitting-balance support: Feet supported;No upper extremity supported Sitting balance-Leahy Scale: Good     Standing balance support: During functional activity;Bilateral upper extremity supported Standing balance-Leahy Scale: Poor Standing balance comment: relies on arms and RW                             Pertinent Vitals/Pain Pain Assessment: Faces Faces Pain Scale: Hurts even more Pain Location: left ankle and foot Pain Descriptors / Indicators: Discomfort;Grimacing;Guarding Pain Intervention(s): Monitored during session;Limited activity within patient's tolerance    Home Living Family/patient expects to be discharged to:: Private residence Living Arrangements: Alone Available Help at Discharge: Personal care attendant Type of Home: Apartment Home Access: Level entry     Home Layout: One level Home Equipment: Toilet riser;Wheelchair - Insurance claims handler - 4 wheels Additional Comments: 2-3 hours x 7 days, pt. reports that CNA is not always available to assist when in patient's apartment per patient    Prior Function Level of Independence: Needs assistance   Gait / Transfers Assistance Needed: pt ambulates with/without  RW without a problem in apartment  ADL's / Homemaking Assistance Needed: pt requires assistance for ADLs        Hand  Dominance   Dominant Hand: Right    Extremity/Trunk Assessment   Upper Extremity Assessment Upper Extremity Assessment: Generalized weakness    Lower Extremity Assessment Lower Extremity Assessment: Generalized weakness;LLE deficits/detail LLE Deficits / Details: tender to palpate the ankle, decreased weight bear on the foot but did place some weight       Communication   Communication: No  difficulties  Cognition Arousal/Alertness: Awake/alert Behavior During Therapy: WFL for tasks assessed/performed Overall Cognitive Status: Within Functional Limits for tasks assessed                                        General Comments      Exercises     Assessment/Plan    PT Assessment Patient needs continued PT services  PT Problem List Decreased strength;Decreased range of motion;Decreased knowledge of use of DME;Decreased activity tolerance;Decreased safety awareness;Decreased balance;Decreased knowledge of precautions;Decreased mobility;Pain       PT Treatment Interventions DME instruction;Gait training;Functional mobility training;Therapeutic activities;Therapeutic exercise;Patient/family education    PT Goals (Current goals can be found in the Care Plan section)  Acute Rehab PT Goals Patient Stated Goal: to be able to walk PT Goal Formulation: With patient Time For Goal Achievement: 09/16/17 Potential to Achieve Goals: Good    Frequency Min 2X/week   Barriers to discharge Decreased caregiver support      Co-evaluation               AM-PAC PT "6 Clicks" Daily Activity  Outcome Measure Difficulty turning over in bed (including adjusting bedclothes, sheets and blankets)?: A Lot Difficulty moving from lying on back to sitting on the side of the bed? : A Lot Difficulty sitting down on and standing up from a chair with arms (e.g., wheelchair, bedside commode, etc,.)?: Unable Help needed moving to and from a bed to chair (including a wheelchair)?: Total Help needed walking in hospital room?: Total Help needed climbing 3-5 steps with a railing? : Total 6 Click Score: 8    End of Session   Activity Tolerance: Patient tolerated treatment well Patient left: in chair;with call bell/phone within reach;with chair alarm set Nurse Communication: Mobility status PT Visit Diagnosis: Unsteadiness on feet (R26.81);History of falling (Z91.81);Pain Pain  - Right/Left: Left Pain - part of body: Ankle and joints of foot    Time: 4174-0814 PT Time Calculation (min) (ACUTE ONLY): 33 min   Charges:   PT Evaluation $PT Eval Moderate Complexity: 1 Mod PT Treatments $Self Care/Home Management: 8-22   PT G CodesTresa Endo PT [phone]   Claretha Cooper 09/02/2017, 9:55 AM

## 2017-09-05 ENCOUNTER — Telehealth: Payer: Self-pay | Admitting: Family Medicine

## 2017-09-05 ENCOUNTER — Ambulatory Visit: Payer: Self-pay

## 2017-09-05 NOTE — Progress Notes (Signed)
HPI:  Chief Complaint  Patient presents with  . Follow-up    hospital follow-up    Brittany Roberts is a 77 y.o. female, who is here today with her daughter to follow on recent hospitalization.   TCM call on 09/05/17.  She was admitted on 09/01/2017 and discharged on 09/02/2017. She presented to the ER complaining of 2 days of left lower extremity edema and erythema.  Left ankle X ray No acute osseous abnormality.  Mild degenerative changes. Lower extremity venous US: No evidence of DVT.  She refused discharge to SNF.   AKI Furosemide was held for 2 days, she resumed it yesterday. She has no noted gross hematuria, decreased urine output, or foam the urine.  Lab Results  Component Value Date   CREATININE 1.82 (H) 09/02/2017   BUN 36 (H) 09/02/2017   NA 142 09/02/2017   K 3.8 09/02/2017   CL 111 09/02/2017   CO2 22 09/02/2017   Hypertension: She is not checking BP at home. Currently she is on Metoprolol Titrate 50 mg twice d And Amlodipine 10 mg daily. CHF with echo in 04/2017 with LVEF 35-40% and grade 1 diastolic dysfunction.  Exertional dyspnea is stable.  Anemia: She is not on iron supplementation. She takes Aspirin 81 mg daily. She has no noted nose/gum bleeding, increased bruising, or other source of bleeding.   Lab Results  Component Value Date   WBC 9.7 09/02/2017   HGB 10.0 (L) 09/02/2017   HCT 29.7 (L) 09/02/2017   MCV 71.1 (L) 09/02/2017   PLT 180 09/02/2017   Gout: Left ankle acute flareup, she was treated with Colchicine 0.6 mg twice daily for a week,course completed. She is currently on Allopurinol 100 mg daily. According to patient, this is the second gout attack since she was first diagnosed a few years ago.  She is on Furosemide 40 mg daily. She has decreased red meat intake and denies high alcohol use.  She has noted improvement of ankle erythema and edema. She has not noted fever, chills, or worsening fatigue.  Still having ankle  pain, bilateral. History of generalized OA and fibromyalgia.  Pain is exacerbated by movement and walking. Alleviated by rest.  She denies injury but tells me that the same day she went to the hospital, she almost fell when getting up from bed, she might have twisted right ankle.   Diarrhea that started after starting Linzess, which she discontinued. Linzess was initially recommended because c/o persistent constipation and OTC treatments were not helping. She does not think colchicine caused or aggravated problem.  She states that she just took 2 doses of Linzess when first prescribed, she has bottle with her today and she has less than half a prescription.  7-8 loose stools daily., no blood. Alleviated by boil eggs. Exacerbated by certain food intake.  "Sometimes" abdominal pain.   Allergy rhinitis: Cough,post nasal drainage,and rhinorrhea in the morning has improved. She is taking Singulair 10 mg daily.  Daughter also requesting PCS, she already ahs [facility] and has a CNA 2-3 hours daily.    Review of Systems  Constitutional: Positive for fatigue. Negative for activity change, appetite change and fever.  HENT: Negative for mouth sores, nosebleeds and trouble swallowing.   Eyes: Negative for redness and visual disturbance.  Respiratory: Positive for cough and shortness of breath (Stable). Negative for wheezing.   Cardiovascular: Negative for chest pain and palpitations.  Gastrointestinal: Positive for diarrhea. Negative for blood in stool, nausea  and vomiting.  Genitourinary: Negative for decreased urine volume, dysuria and hematuria.  Musculoskeletal: Positive for arthralgias, back pain and gait problem.  Skin: Negative for rash and wound.  Allergic/Immunologic: Positive for environmental allergies.  Neurological: Negative for syncope, weakness and headaches.  Psychiatric/Behavioral: Negative for confusion. The patient is nervous/anxious.       Current Outpatient Medications  on File Prior to Visit  Medication Sig Dispense Refill  . ACCU-CHEK AVIVA PLUS test strip TEST three times a day 300 each 3  . acetaminophen (TYLENOL) 325 MG tablet Take 650 mg by mouth every 6 (six) hours as needed for mild pain or moderate pain.    Marland Kitchen albuterol (PROAIR HFA) 108 (90 Base) MCG/ACT inhaler inhale 2 puffs by mouth every 4 hours if needed USE ONLY IF YOU ARE WHEEZING 3 Inhaler 3  . allopurinol (ZYLOPRIM) 100 MG tablet Take 1 tablet (100 mg total) by mouth daily. 30 tablet 3  . amLODipine (NORVASC) 10 MG tablet Take 1 tablet (10 mg total) by mouth daily. 90 tablet 3  . Ascorbic Acid (VITAMIN C PO) Take 1 tablet by mouth daily.    Marland Kitchen aspirin EC 81 MG tablet Take 162 mg by mouth daily.     Marland Kitchen atorvastatin (LIPITOR) 20 MG tablet Take 1 tablet (20 mg total) by mouth daily. 90 tablet 1  . B Complex Vitamins (VITAMIN B COMPLEX) TABS Take 1 tablet by mouth daily.    . budesonide-formoterol (SYMBICORT) 160-4.5 MCG/ACT inhaler Inhale 2 puffs into the lungs 2 (two) times daily. 1 Inhaler 3  . Cholecalciferol (VITAMIN D3) 5000 UNITS CAPS Take 5,000 Units by mouth daily.    . colchicine 0.6 MG tablet 1 tab bid for 1 week then prn 2 tabs once when acute gout onset (no more than 2 tabs per day). 45 tablet 0  . Flaxseed, Linseed, (FLAXSEED OIL PO) Take 1 capsule by mouth daily.    . furosemide (LASIX) 40 MG tablet Take 1 tablet (40 mg total) by mouth daily. (hold this for 2 days, then resume.  Please follow up with PCP for repeat labs and long term plan) 30 tablet 0  . gabapentin (NEURONTIN) 300 MG capsule Take 1 capsule (300 mg total) by mouth 2 (two) times daily. 180 capsule 3  . guaiFENesin (ROBITUSSIN) 100 MG/5ML liquid Take 200 mg by mouth 3 (three) times daily as needed for cough.    . Insulin NPH, Human,, Isophane, (HUMULIN N KWIKPEN) 100 UNIT/ML Kiwkpen Inject 30 Units into the skin every morning. 15 mL 0  . Insulin Pen Needle (PEN NEEDLES 31GX5/16") 31G X 8 MM MISC Check Blood sugar three  times per day. Dx insulin using E11.8 100 each 3  . levETIRAcetam (KEPPRA) 500 MG tablet Take 1 tablet (500 mg total) by mouth 2 (two) times daily. 60 tablet 11  . metFORMIN (GLUCOPHAGE) 500 MG tablet take 1 tablet by mouth twice a day with meals for diabetes 60 tablet 0  . metoprolol tartrate (LOPRESSOR) 50 MG tablet take 1 tablet by mouth once daily WITH OR IMMEDIATELY FOLLOWING A MEAL FOR HYPERTENSION 30 tablet 0  . montelukast (SINGULAIR) 10 MG tablet Take 1 tablet (10 mg total) by mouth at bedtime. 30 tablet 0  . Multiple Vitamin (MULTIVITAMIN WITH MINERALS) TABS tablet Take 1 tablet by mouth daily.    . nitroGLYCERIN (NITROSTAT) 0.4 MG SL tablet Place 1 tablet (0.4 mg total) under the tongue every 5 (five) minutes as needed for chest pain. 20 tablet 12  .  pantoprazole (PROTONIX) 40 MG tablet Take 1 tablet (40 mg total) by mouth daily. 90 tablet 3  . Polyethyl Glycol-Propyl Glycol (SYSTANE ULTRA OP) Place 1 drop into both eyes every 6 (six) hours as needed (dry eyes).     No current facility-administered medications on file prior to visit.      Past Medical History:  Diagnosis Date  . Acute delirium 01/14/2017  . Anxiety   . Arthritis    "all over"  . Asthma   . Bipolar disorder ([place])   . Complication of anesthesia    "w/right foot OR they gave me too much and they couldn't get me woke"  . Congestive heart failure, unspecified   . Coronary artery calcification seen on CAT scan 01/14/2017  . Coronary artery disease    "I've got 1 stent" (06/30/2015)  . Depression   . Discoid lupus   . Fibromyalgia    "I've been told I have this; can't take Lyrica cause I'm allergic to it" (06/30/2015)  . GERD (gastroesophageal reflux disease)   . History of gout   . History of hiatal hernia    "real bad" (06/30/2015)  . Hypertension   . Hypothyroidism   . On home oxygen therapy    "2L q hs" (06/30/2015)  . OSA (obstructive sleep apnea)    "just use my oxygen; no mask" (06/30/2015)  . Other  and unspecified hyperlipidemia   . Penetrating foot wound    left nonhealing foot wound on the dorsal surface  . Peripheral neuropathy   . Pneumonia "many of times"  . Refusal of blood transfusions as patient is Jehovah's Witness   . Renal failure, unspecified    "kidneys not working 100%" (06/30/2015)  . Sickle cell trait ([place])   . Systemic lupus ([place])   . Thoracic aortic atherosclerosis ([place]) 01/14/2017  . Type II diabetes mellitus ([place])   . Unspecified vitamin D deficiency    Allergies  Allergen Reactions  . Ace Inhibitors Other (See Comments)    Coincided with sig bump in creat. Retried and creat bumped again.  Beulah Gandy [Isosorb Dinitrate-Hydralazine] Other (See Comments)    Sleep all the time.  Marland Kitchen Penicillins Anaphylaxis, Swelling and Rash    Has patient had a PCN reaction causing immediate rash, facial/tongue/throat swelling, SOB or lightheadedness with hypotension: Yes Has patient had a PCN reaction causing severe rash involving mucus membranes or skin necrosis: Yes Has patient had a PCN reaction that required hospitalization: Yes Has patient had a PCN reaction occurring within the last 10 years: No If all of the above answers are "NO", then may proceed with Cephalosporin use.   Ebbie Ridge [Buspirone] Other (See Comments)    pain  . Pregabalin Swelling  . Ropinirole Hydrochloride Swelling  . [place] Rash    "Swelling of the tongue"    Social History   Socioeconomic History  . Marital status: Single    Spouse name: None  . Number of children: 4  . Years of education: 62 th  . Highest education level: None  Social Needs  . Financial resource strain: None  . Food insecurity - worry: None  . Food insecurity - inability: None  . Transportation needs - medical: None  . Transportation needs - non-medical: None  Occupational History  . Occupation: Retired- Engineer, production    Employer: RETIRED  Tobacco Use  . Smoking status: Former Smoker    Packs/day:  3.00    Years: 40.00    Pack years:  120.00    Types: Cigarettes    Start date: 07/05/1960    Last attempt to quit: 07/05/1998    Years since quitting: 19.1  . Smokeless tobacco: Never Used  Substance and Sexual Activity  . Alcohol use: No    Alcohol/week: 0.0 oz    Comment: 06/30/2015 "used to drink a little alcohol on the weekends; not q weekend; nothing in years"  . Drug use: No  . Sexual activity: No  Other Topics Concern  . None  Social History Narrative   Health Care POA:    Emergency Contact: daughter, Lorenso Courier [phone] or [name] [phone]   End of Life Plan: Does not want to be ventilated or feeding tubes.    Who lives with you: Lives alone   Any pets: none   Diet: Patient reports enjoying and eating junk food.  Does not regulate types of food and currently her dentures are broken so it is hard to eat some foods.   Exercise: Patient does not have an exercise plan.   Seatbelts: Patient reports wearing seatbelt when in vehicle.   Sun Exposure/Protection: Patient reports not going outside very often.   Hobbies: Patient enjoys reading the bible and watching game shows.    Right-handed.   1 cup caffeine per day.   Former smoker-stopped 2000   Alcohol none    Vitals:   09/06/17 1120  BP: 134/82  Pulse: 62  Resp: 16  Temp: 98.7 F (37.1 C)  SpO2: 96%   Body mass index is 47.74 kg/m. Wt Readings from Last 3 Encounters:  09/06/17 269 lb 8 oz (122.2 kg)  09/01/17 269 lb 13.5 oz (122.4 kg)  07/19/17 273 lb 2 oz (123.9 kg)     Physical Exam  Nursing note and vitals reviewed. Constitutional: She is oriented to person, place, and time. She appears well-developed. No distress.  HENT:  Head: Normocephalic and atraumatic.  Mouth/Throat: Oropharynx is clear and moist and mucous membranes are normal.  Edentulous.  Eyes: Conjunctivae are normal.  Cardiovascular: Normal rate. An irregular rhythm present.  No murmur heard. Pulses:      Dorsalis pedis pulses are 2+  on the left side.  Respiratory: Effort normal and breath sounds normal. No respiratory distress.  GI: Soft. She exhibits no mass. There is no tenderness.  Musculoskeletal: She exhibits edema.       Left ankle: Tenderness. Lateral malleolus tenderness found.  Mild tenderness upon palpation around lateral ankle malleolus (calcaneofibular ligament and anterior talofibular ligament).  No erythema or local heat. No significant limitation of ROM, pain with movement.   Lymphadenopathy:    She has no cervical adenopathy.  Neurological: She is alert and oriented to person, place, and time.  No focal deficits appreciated. Unstable gait assisted with a walker.  Skin: Skin is warm. No rash noted. No erythema.  Psychiatric: Her mood appears anxious.  Well groomed, good eye contact.    ASSESSMENT AND PLAN:  Ms. Sarahy was seen today for follow-up.  Orders Placed This Encounter  Procedures  . Comprehensive metabolic panel  . CBC   Diarrhea, unspecified type  It seems like she might have taken more Linzess that reported, I asked her daughter to be sure that she is not longer taking this medication. Some of her medications could also aggravate/contribute to this problem.  She is no longer on colchicine, for now no changes in the rest of her medications. ? IBS.  Colonoscopy in 12/2013 performed because changes in bowel habits: Polypectomy of  small sessile polyp, otherwise normal colonoscopy. OTC probiotic might help. Metamucil daily also recommended. Adequate hydration. Follow-up in 6 weeks.  Allergic rhinitis Symptoms improved. Continue current management. Follow-up in 6-12 months, before if needed.   Chronic combined systolic and diastolic CHF, NYHA class 2 (HCC) Symptoms stable overall. Low-salt diet recommended. She is back on Furosemide 40 mg daily. Continue Metoprolol Titrate 50 mg twice daily. Continue following with cardiologist.   AKI (acute kidney injury) ([place]) Further  recommendations will be given according to lab results. Diarrhea and Furosemide might aggravate problem. She is not on ACE inhibitor or ARB. Low-salt diet. Adequate hydration.    Anemia Chronic. Further recommendations will be given according to lab results.  Fibromyalgia Educated about diagnosis. She will continue Gabapentin 300 mg twice daily. Fall precautions also discussed.  Gout attack Resolved. She has some residual ankle pain bilaterally, which could be related to OA. Recommend ROM exercises. Follow-up in 6 weeks.   She already has home health service for [facility]. Explained that she can request increase in hours of service, recommend contacting agency directly.    Rony Ratz G. [name], MD  [facility]. [place] office.

## 2017-09-05 NOTE — Telephone Encounter (Signed)
Phone call from pt; c/o frequent diarrhea stools.  Reported this has been ongoing since she started the gout medication.  (has been on Allopurinol since 1/25)  Stated she has been having approx. 7 stools / day.  Stated the stools vary in color and are loose in consistency. Denied any blood in stools. Denied abdominal pain, nausea or vomiting.  Denied weakness or dizziness.  Stated she feels she drinks a lot of fluids.  Reported has a dry mouth, and urinates in varying amts.  Reported the only thing she has tried for diarrhea is eating hard boiled eggs, and that has helped.  Denied taking any OTC antidiarrheal medications.  Reported she has an appt. on Friday for hospital f/u;   Offered to change her appt. to tomorrow, 3/5, due to the frequency of stools.  Agreed with plan.    Care advice per protocol.  Verb. Understanding.   . Reason for Disposition . [1] SEVERE diarrhea (e.g., 7 or more times / day more than normal) AND [2]  age > 60 years  Answer Assessment - Initial Assessment Questions 1. DIARRHEA SEVERITY: "How bad is the diarrhea?" "How many extra stools have you had in the past 24 hours than normal?"    - MILD: Few loose or mushy BMs; increase of 1-3 stools over normal daily number of stools; mild increase in ostomy output.   - MODERATE: Increase of 4-6 stools daily over normal; moderate increase in ostomy output.   - SEVERE (or Worst Possible): Increase of 7 or more stools daily over normal; moderate increase in ostomy output; incontinence.     About 7 stools per day;  2. ONSET: "When did the diarrhea begin?"     Since starting the Allopurinol 3. BM CONSISTENCY: "How loose or watery is the diarrhea?"     Loose consistency 4. VOMITING: "Are you also vomiting?" If so, ask: "How many times in the past 24 hours?"      No 5. ABDOMINAL PAIN: "Are you having any abdominal pain?" If yes: "What does it feel like?" (e.g., crampy, dull, intermittent, constant)      no 6. ABDOMINAL PAIN SEVERITY: If  present, ask: "How bad is the pain?"  (e.g., Scale 1-10; mild, moderate, or severe)    - MILD (1-3): doesn't interfere with normal activities, abdomen soft and not tender to touch     - MODERATE (4-7): interferes with normal activities or awakens from sleep, tender to touch     - SEVERE (8-10): excruciating pain, doubled over, unable to do any normal activities       No  7. ORAL INTAKE: If vomiting, "Have you been able to drink liquids?" "How much fluids have you had in the past 24 hours?"     States she drinks a lot of fluids 8. HYDRATION: "Any signs of dehydration?" (e.g., dry mouth [not just dry lips], too weak to stand, dizziness, new weight loss) "When did you last urinate?"     C/o dry mouth.  Reported her urination varies from a little to a lot.  Denies weakness or dizziness.   9. EXPOSURE: "Have you traveled to a foreign country recently?" "Have you been exposed to anyone with diarrhea?" "Could you have eaten any food that was spoiled?"     No  10. OTHER SYMPTOMS: "Do you have any other symptoms?" (e.g., fever, blood in stool)       No ; sometimes her feet and legs feel like they are real hot, and sometimes they  feel cold; c/o intermittent feeling that right leg could give out.  11. PREGNANCY: "Is there any chance you are pregnant?" "When was your last menstrual period?"      no  Protocols used: DIARRHEA-A-AH

## 2017-09-05 NOTE — Telephone Encounter (Signed)
Transition Care Management Follow-up Telephone Call  Brittany Roberts INO:[id number] DOB: 07-Mar-1941 DOA: 09/01/2017  PCP: [name], [name], MD  Admit date: 09/01/2017 Discharge date: 09/02/2017  Time spent: over 30 minutes  Recommendations for Outpatient Follow-up:  1. Follow up outpatient CBC/CMP within 2-3 days 2. Follow up L ankle pain.  She's improved with colchicine, suggesting gout, but if she does not continue to improve, would consider arthrocentesis or further imaging.  Low suspicion for infection at this point in time.  Continue colchine as previously prescribed (BID x 1 week and then follow up with PCP for long term plan for colchicine).  Continue allopurinol. 3. Creatinine a bit elevated today, but has been in this range before in November of last year.  Received IVF here.  UA clean.  Hold lasix x2 days, then resume at previous dose.  Follow up outpatient creatinine.  Follow up with PCP regarding continue lasix dosing.  Weigh patient daily while holding lasix, if weight increases more than 2-3 lbs in 1 day, resume lasix.  Pt decided not to go home with SNF, couldn't find one she liked.  Discussed after this decision was made and ensured discharge instructions clear to pt and family.  Her ankle had improved even further this afternoon, which is reassuring.      How have you been since you were released from the hospital? "much better"   Do you understand why you were in the hospital? yes   Do you understand the discharge instructions? yes   Where were you discharged to? Home   Items Reviewed:  Medications reviewed: yes  Allergies reviewed: yes  Dietary changes reviewed: yes  Referrals reviewed: yes   Functional Questionnaire:   Activities of Daily Living (ADLs):   She states they are independent in the following: ambulation, feeding, continence, grooming, toileting and dressing States they require assistance with the following: bathing and hygiene   Any  transportation issues/concerns?: no   Any patient concerns? no   Confirmed importance and date/time of follow-up visits scheduled yes  Provider Appointment booked with Dr. [name] on 09/06/2017 Tuesday at 11:30 am  Confirmed with patient if condition begins to worsen call PCP or go to the ER.  Patient was given the office number and encouraged to call back with question or concerns.  : yes

## 2017-09-06 ENCOUNTER — Encounter: Payer: Self-pay | Admitting: Family Medicine

## 2017-09-06 ENCOUNTER — Ambulatory Visit (INDEPENDENT_AMBULATORY_CARE_PROVIDER_SITE_OTHER): Payer: Medicare Other | Admitting: Family Medicine

## 2017-09-06 VITALS — BP 134/82 | HR 62 | Temp 98.7°F | Resp 16 | Ht 63.0 in | Wt 269.5 lb

## 2017-09-06 DIAGNOSIS — R197 Diarrhea, unspecified: Secondary | ICD-10-CM

## 2017-09-06 DIAGNOSIS — J309 Allergic rhinitis, unspecified: Secondary | ICD-10-CM

## 2017-09-06 DIAGNOSIS — I5042 Chronic combined systolic (congestive) and diastolic (congestive) heart failure: Secondary | ICD-10-CM

## 2017-09-06 DIAGNOSIS — D500 Iron deficiency anemia secondary to blood loss (chronic): Secondary | ICD-10-CM | POA: Diagnosis not present

## 2017-09-06 DIAGNOSIS — N179 Acute kidney failure, unspecified: Secondary | ICD-10-CM

## 2017-09-06 DIAGNOSIS — M109 Gout, unspecified: Secondary | ICD-10-CM | POA: Diagnosis not present

## 2017-09-06 DIAGNOSIS — M797 Fibromyalgia: Secondary | ICD-10-CM

## 2017-09-06 LAB — COMPREHENSIVE METABOLIC PANEL
ALT: 14 U/L (ref 0–35)
AST: 24 U/L (ref 0–37)
Albumin: 3.7 g/dL (ref 3.5–5.2)
Alkaline Phosphatase: 112 U/L (ref 39–117)
BUN: 28 mg/dL — ABNORMAL HIGH (ref 6–23)
CO2: 21 mEq/L (ref 19–32)
Calcium: 9.6 mg/dL (ref 8.4–10.5)
Chloride: 109 mEq/L (ref 96–112)
Creatinine, Ser: 1.53 mg/dL — ABNORMAL HIGH (ref 0.40–1.20)
GFR: 42.23 mL/min — ABNORMAL LOW (ref >60.00)
Glucose, Bld: 74 mg/dL (ref 70–99)
Potassium: 4.2 mEq/L (ref 3.5–5.1)
Sodium: 139 mEq/L (ref 135–145)
Total Bilirubin: 0.3 mg/dL (ref 0.2–1.2)
Total Protein: 8.6 g/dL — ABNORMAL HIGH (ref 6.0–8.3)

## 2017-09-06 LAB — CBC
HCT: 34.2 % — ABNORMAL LOW (ref 36.0–46.0)
Hemoglobin: 11.1 g/dL — ABNORMAL LOW (ref 12.0–15.0)
MCHC: 32.4 g/dL (ref 30.0–36.0)
MCV: 74.4 fl — ABNORMAL LOW (ref 78.0–100.0)
Platelets: 255 10*3/uL (ref 150.0–400.0)
RBC: 4.6 Mil/uL (ref 3.87–5.11)
RDW: 15.7 % — ABNORMAL HIGH (ref 11.5–15.5)
WBC: 7.3 10*3/uL (ref 4.0–10.5)

## 2017-09-06 NOTE — Assessment & Plan Note (Signed)
Further recommendations will be given according to lab results. Diarrhea and Furosemide might aggravate problem. She is not on ACE inhibitor or ARB. Low-salt diet. Adequate hydration.

## 2017-09-06 NOTE — Assessment & Plan Note (Signed)
Symptoms improved. Continue current management. Follow-up in 6-12 months, before if needed.

## 2017-09-06 NOTE — Assessment & Plan Note (Signed)
Chronic. Further recommendations will be given according to lab results.

## 2017-09-06 NOTE — Assessment & Plan Note (Signed)
Educated about diagnosis. She will continue Gabapentin 300 mg twice daily. Fall precautions also discussed.

## 2017-09-06 NOTE — Assessment & Plan Note (Signed)
Resolved. She has some residual ankle pain bilaterally, which could be related to OA. Recommend ROM exercises. Follow-up in 6 weeks.

## 2017-09-06 NOTE — Assessment & Plan Note (Signed)
Symptoms stable overall. Low-salt diet recommended. She is back on Furosemide 40 mg daily. Continue Metoprolol Titrate 50 mg twice daily. Continue following with cardiologist.

## 2017-09-06 NOTE — Patient Instructions (Addendum)
A few things to remember from today's visit:   AKI (acute kidney injury) ([place]) - Plan: Comprehensive metabolic panel  Iron deficiency anemia due to chronic blood loss - Plan: CBC  Chronic combined systolic and diastolic CHF, NYHA class 2 (HCC)  Diarrhea, unspecified type  Metamucil daily.  Align 1 cap daily (Probiotic).  ABC exercises for ankle.  Stop Linzess.   Please be sure medication list is accurate. If a new problem present, please set up appointment sooner than planned today.

## 2017-09-09 ENCOUNTER — Ambulatory Visit: Payer: Medicare Other | Admitting: Family Medicine

## 2017-09-11 DIAGNOSIS — J45909 Unspecified asthma, uncomplicated: Secondary | ICD-10-CM | POA: Diagnosis not present

## 2017-09-15 ENCOUNTER — Other Ambulatory Visit: Payer: Self-pay | Admitting: Family Medicine

## 2017-09-17 ENCOUNTER — Other Ambulatory Visit: Payer: Self-pay | Admitting: Family Medicine

## 2017-09-17 DIAGNOSIS — E1149 Type 2 diabetes mellitus with other diabetic neurological complication: Secondary | ICD-10-CM

## 2017-09-20 ENCOUNTER — Other Ambulatory Visit: Payer: Self-pay | Admitting: Family Medicine

## 2017-10-03 ENCOUNTER — Other Ambulatory Visit: Payer: Self-pay | Admitting: Family Medicine

## 2017-10-12 DIAGNOSIS — I2510 Atherosclerotic heart disease of native coronary artery without angina pectoris: Secondary | ICD-10-CM | POA: Diagnosis not present

## 2017-10-12 DIAGNOSIS — Z9861 Coronary angioplasty status: Secondary | ICD-10-CM | POA: Diagnosis not present

## 2017-10-12 DIAGNOSIS — I428 Other cardiomyopathies: Secondary | ICD-10-CM | POA: Diagnosis not present

## 2017-10-12 DIAGNOSIS — I10 Essential (primary) hypertension: Secondary | ICD-10-CM | POA: Diagnosis not present

## 2017-10-12 DIAGNOSIS — J45909 Unspecified asthma, uncomplicated: Secondary | ICD-10-CM | POA: Diagnosis not present

## 2017-10-13 ENCOUNTER — Ambulatory Visit (INDEPENDENT_AMBULATORY_CARE_PROVIDER_SITE_OTHER): Payer: Medicare Other | Admitting: Psychiatry

## 2017-10-13 ENCOUNTER — Encounter (HOSPITAL_COMMUNITY): Payer: Self-pay | Admitting: Psychiatry

## 2017-10-13 VITALS — BP 118/64 | HR 83 | Ht 63.5 in | Wt 272.0 lb

## 2017-10-13 DIAGNOSIS — F4323 Adjustment disorder with mixed anxiety and depressed mood: Secondary | ICD-10-CM | POA: Diagnosis not present

## 2017-10-13 DIAGNOSIS — Z87891 Personal history of nicotine dependence: Secondary | ICD-10-CM | POA: Diagnosis not present

## 2017-10-13 NOTE — Progress Notes (Signed)
Psychiatric Initial Adult Assessment   Patient Identification: Brittany Roberts MRN:  [id number] Date of Evaluation:  10/13/2017 Referral Source: Dr.Aquuino Chief Complaint:   Visit Diagnosis: No diagnosis found.  History of Present Illness:  This patient is a 77 year old African-American  Female lives alone. She supported emotionally and financially by her 2 daughters. The patient is never been married. She has 4 children but 2 of whom are alive. She has 6 grandchildren. The patient is not involved in a romantic relationship. The patient referred by her neurologist after she was hospitalized and had a seizure. Apparently her primary care doctor prescribes Seroquel and Cymbalta. They were not clear exactly why they were prescribed this medicine other than the fact Cymbalta could've been used for her neuropathies. They're not clear exactly what type of psychiatric problems she was having. A close evaluation the patient denies any specific problems. 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 The patient says she feels great. She is happy with life. She sleeping and eating well. She's got good energy. He can concentrate well. She's a good sense of self-esteem. The patient denies being suicidal now or ever. She enjoys the television arts and crafts and cooking. The patient denies the use of alcohol or drugs. She's never had psychotic symptoms. She denies a clear episode of major depression or symptoms of mania. She denies symptoms of generalized anxiety disorder, panic disorder or obsessive-compulsive disorder area The patient is never been in a psychiatric hospital. She's never been seen by a psychiatrist according to her history. The patient does not know why she was 1 the medication she was on specifically Cymbalta or Seroquel.Patient says she was referred to the setting possibly find a therapist. But in reality the patient denies a need for a therapist. At this time she is very stable. Patient  claims that she was overmedicated and as why she had a seizure and was hospitalized. Her medical illnesses include history of a seizure which she takes [place] for. The patient also has hypertension diabetes and is believed that she's had a CVA in the past. CVA might explain her past seizure. At this time she's not on any psychiatric medications.  Associated Signs/Symptoms: Depression Symptoms:  weight gain, (Hypo) Manic Symptoms:   Anxiety Symptoms:   Psychotic Symptoms:   PTSD Symptoms:   Past Psychiatric History: history of being on Cymbalta and Seroquel  Previous Psychotropic Medications: Yes   Substance Abuse History in the last 12 months:  No.  Consequences of Substance Abuse:   Past Medical History:  Past Medical History:  Diagnosis Date  . Acute delirium 01/14/2017  . Anxiety   . Arthritis    "all over"  . Asthma   . Bipolar disorder ([place])   . Complication of anesthesia    "w/right foot OR they gave me too much and they couldn't get me woke"  . Congestive heart failure, unspecified   . Coronary artery calcification seen on CAT scan 01/14/2017  . Coronary artery disease    "I've got 1 stent" (06/30/2015)  . Depression   . Discoid lupus   . Fibromyalgia    "I've been told I have this; can't take Lyrica cause I'm allergic to it" (06/30/2015)  . GERD (gastroesophageal reflux disease)   . History of gout   . History of hiatal hernia    "real bad" (06/30/2015)  . Hypertension   . Hypothyroidism   . On home oxygen therapy    "2L q hs" (06/30/2015)  .  OSA (obstructive sleep apnea)    "just use my oxygen; no mask" (06/30/2015)  . Other and unspecified hyperlipidemia   . Penetrating foot wound    left nonhealing foot wound on the dorsal surface  . Peripheral neuropathy   . Pneumonia "many of times"  . Refusal of blood transfusions as patient is Jehovah's Witness   . Renal failure, unspecified    "kidneys not working 100%" (06/30/2015)  . Sickle cell trait ([place])    . Systemic lupus ([place])   . Thoracic aortic atherosclerosis ([place]) 01/14/2017  . Type II diabetes mellitus ([place])   . Unspecified vitamin D deficiency     Past Surgical History:  Procedure Laterality Date  . ANKLE ARTHROSCOPY WITH OPEN REDUCTION INTERNAL FIXATION (ORIF) Right 03/2011  . CARDIAC CATHETERIZATION N/A 07/01/2015   Procedure: Left Heart Cath and Coronary Angiography;  Surgeon: Adrian Prows, MD;  Location: [facility];  Service: Cardiovascular;  Laterality: N/A;  . CARDIAC CATHETERIZATION N/A 07/01/2015   Procedure: Intravascular Pressure Wire/FFR Study;  Surgeon: Adrian Prows, MD;  Location: [facility];  Service: Cardiovascular;  Laterality: N/A;  . CARPAL TUNNEL RELEASE Bilateral   . CATARACT EXTRACTION W/ INTRAOCULAR LENS  IMPLANT, BILATERAL Bilateral   . CHOLECYSTECTOMY OPEN    . COLONOSCOPY N/A 12/10/2013   Procedure: COLONOSCOPY;  Surgeon: Inda Castle, MD;  Location: [facility];  Service: Endoscopy;  Laterality: N/A;  . FRACTURE SURGERY    . INCISION AND DRAINAGE ABSCESS Left    foot "under my little toe"  . KNEE LIGAMENT RECONSTRUCTION Right   . LEFT HEART CATHETERIZATION WITH CORONARY ANGIOGRAM N/A 08/08/2012   Procedure: LEFT HEART CATHETERIZATION WITH CORONARY ANGIOGRAM;  Surgeon: [name], MD;  Location: [facility];  Service: Cardiovascular;  Laterality: N/A;  . NASAL SINUS SURGERY    . PERCUTANEOUS CORONARY STENT INTERVENTION (PCI-S) N/A 08/21/2012   Procedure: PERCUTANEOUS CORONARY STENT INTERVENTION (PCI-S);  Surgeon: [name], MD;  Location: [facility];  Service: Cardiovascular;  Laterality: N/A;  . TUBAL LIGATION      Family Psychiatric History:   Family History:  Family History  Problem Relation Age of Onset  . Heart disease Mother   . Diabetes Mother   . Clotting disorder Mother   . Pneumonia Father   . Rheum arthritis Father   . Diabetes Sister   . Diabetes Sister   . Asthma Brother   . Cancer Brother   . Kidney  disease Brother   . Lupus Son   . Heart disease Son   . Heart disease Daughter   . Colon cancer Neg Hx     Social History:   Social History   Socioeconomic History  . Marital status: Single    Spouse name: Not on file  . Number of children: 4  . Years of education: 62 th  . Highest education level: Not on file  Occupational History  . Occupation: Retired- Engineer, production    Employer: RETIRED  Social Needs  . Financial resource strain: Not on file  . Food insecurity:    Worry: Not on file    Inability: Not on file  . Transportation needs:    Medical: Not on file    Non-medical: Not on file  Tobacco Use  . Smoking status: Former Smoker    Packs/day: 3.00    Years: 40.00    Pack years: 120.00    Types: Cigarettes    Start date: 07/05/1960  Last attempt to quit: 07/05/1998    Years since quitting: 19.2  . Smokeless tobacco: Never Used  Substance and Sexual Activity  . Alcohol use: No    Alcohol/week: 0.0 oz    Comment: 06/30/2015 "used to drink a little alcohol on the weekends; not q weekend; nothing in years"  . Drug use: No  . Sexual activity: Never  Lifestyle  . Physical activity:    Days per week: Not on file    Minutes per session: Not on file  . Stress: Not on file  Relationships  . Social connections:    Talks on phone: Not on file    Gets together: Not on file    Attends religious service: Not on file    Active member of club or organization: Not on file    Attends meetings of clubs or organizations: Not on file    Relationship status: Not on file  Other Topics Concern  . Not on file  Social History Narrative   Health Care POA:    Emergency Contact: daughter, Lorenso Courier [phone] or [name] [phone]   End of Life Plan: Does not want to be ventilated or feeding tubes.    Who lives with you: Lives alone   Any pets: none   Diet: Patient reports enjoying and eating junk food.  Does not regulate types of food and currently her dentures are  broken so it is hard to eat some foods.   Exercise: Patient does not have an exercise plan.   Seatbelts: Patient reports wearing seatbelt when in vehicle.   Sun Exposure/Protection: Patient reports not going outside very often.   Hobbies: Patient enjoys reading the bible and watching game shows.    Right-handed.   1 cup caffeine per day.   Former smoker-stopped 2000   Alcohol none    Additional Social History:   Allergies:   Allergies  Allergen Reactions  . Ace Inhibitors Other (See Comments)    Coincided with sig bump in creat. Retried and creat bumped again.  Beulah Gandy [Isosorb Dinitrate-Hydralazine] Other (See Comments)    Sleep all the time.  Marland Kitchen Penicillins Anaphylaxis, Swelling and Rash    Has patient had a PCN reaction causing immediate rash, facial/tongue/throat swelling, SOB or lightheadedness with hypotension: Yes Has patient had a PCN reaction causing severe rash involving mucus membranes or skin necrosis: Yes Has patient had a PCN reaction that required hospitalization: Yes Has patient had a PCN reaction occurring within the last 10 years: No If all of the above answers are "NO", then may proceed with Cephalosporin use.   Ebbie Ridge [Buspirone] Other (See Comments)    pain  . Pregabalin Swelling  . Ropinirole Hydrochloride Swelling  . Amantadines Rash    "Swelling of the tongue"    Metabolic Disorder Labs: Lab Results  Component Value Date   HGBA1C 6.5 07/19/2017   MPG 160 01/15/2017   MPG 128 (H) 03/30/2011   No results found for: PROLACTIN Lab Results  Component Value Date   CHOL 103 07/19/2014   TRIG 81 07/19/2014   HDL 38 (L) 07/19/2014   CHOLHDL 2.7 07/19/2014   VLDL 16 07/19/2014   LDLCALC 49 07/19/2014   LDLCALC 94 09/15/2011     Current Medications: Current Outpatient Medications  Medication Sig Dispense Refill  . ACCU-CHEK AVIVA PLUS test strip TEST three times a day 300 each 3  . acetaminophen (TYLENOL) 325 MG tablet Take 650 mg by mouth  every 6 (six) hours as needed for  mild pain or moderate pain.    Marland Kitchen albuterol (PROAIR HFA) 108 (90 Base) MCG/ACT inhaler inhale 2 puffs by mouth every 4 hours if needed USE ONLY IF YOU ARE WHEEZING 3 Inhaler 3  . allopurinol (ZYLOPRIM) 100 MG tablet Take 1 tablet (100 mg total) by mouth daily. 30 tablet 3  . amLODipine (NORVASC) 10 MG tablet Take 1 tablet (10 mg total) by mouth daily. 90 tablet 3  . Ascorbic Acid (VITAMIN C PO) Take 1 tablet by mouth daily.    Marland Kitchen aspirin EC 81 MG tablet Take 162 mg by mouth daily.     Marland Kitchen atorvastatin (LIPITOR) 20 MG tablet TAKE 1 TABLET BY MOUTH ONCE DAILY 90 tablet 0  . B Complex Vitamins (VITAMIN B COMPLEX) TABS Take 1 tablet by mouth daily.    . budesonide-formoterol (SYMBICORT) 160-4.5 MCG/ACT inhaler Inhale 2 puffs into the lungs 2 (two) times daily. 1 Inhaler 3  . Cholecalciferol (VITAMIN D3) 5000 UNITS CAPS Take 5,000 Units by mouth daily.    . colchicine 0.6 MG tablet 1 tab bid for 1 week then prn 2 tabs once when acute gout onset (no more than 2 tabs per day). 45 tablet 0  . Flaxseed, Linseed, (FLAXSEED OIL PO) Take 1 capsule by mouth daily.    . furosemide (LASIX) 40 MG tablet Take 1 tablet (40 mg total) by mouth daily. (hold this for 2 days, then resume.  Please follow up with PCP for repeat labs and long term plan) 30 tablet 0  . gabapentin (NEURONTIN) 300 MG capsule Take 1 capsule (300 mg total) by mouth 2 (two) times daily. 180 capsule 3  . guaiFENesin (ROBITUSSIN) 100 MG/5ML liquid Take 200 mg by mouth 3 (three) times daily as needed for cough.    Marland Kitchen HUMULIN N KWIKPEN 100 UNIT/ML Kiwkpen INJECT 30 UNITS SUBCUTANEOUSLY EVERY MORNING 30 mL 0  . Insulin Pen Needle (PEN NEEDLES 31GX5/16") 31G X 8 MM MISC Check Blood sugar three times per day. Dx insulin using E11.8 100 each 3  . levETIRAcetam (KEPPRA) 500 MG tablet Take 1 tablet (500 mg total) by mouth 2 (two) times daily. 60 tablet 11  . metFORMIN (GLUCOPHAGE) 500 MG tablet TAKE 1 TABLET BY MOUTH TWICE A  DAY WITH FOOD 60 tablet 0  . metoprolol tartrate (LOPRESSOR) 50 MG tablet TAKE 1 TABLET BY MOUTH ONCE DAILY WITH OR IMMEDIATELY FOLLOWING A MEAL FOR HYPERTENSION 30 tablet 0  . montelukast (SINGULAIR) 10 MG tablet Take 1 tablet (10 mg total) by mouth at bedtime. 30 tablet 0  . Multiple Vitamin (MULTIVITAMIN WITH MINERALS) TABS tablet Take 1 tablet by mouth daily.    . nitroGLYCERIN (NITROSTAT) 0.4 MG SL tablet Place 1 tablet (0.4 mg total) under the tongue every 5 (five) minutes as needed for chest pain. 20 tablet 12  . pantoprazole (PROTONIX) 40 MG tablet Take 1 tablet (40 mg total) by mouth daily. 90 tablet 3  . Polyethyl Glycol-Propyl Glycol (SYSTANE ULTRA OP) Place 1 drop into both eyes every 6 (six) hours as needed (dry eyes).     No current facility-administered medications for this visit.     Neurologic: Headache: No Seizure: Yes Paresthesias:No  Musculoskeletal: Strength & Muscle Tone: decreased Gait & Station: unsteady Patient leans: N/A  Psychiatric Specialty Exam: ROS  Blood pressure 118/64, pulse 83, height 5' 3.5" (1.613 m), weight 272 lb (123.4 kg).Body mass index is 47.43 kg/m.  General Appearance: Casual  Eye Contact:  Good  Speech:  Clear and Coherent  Volume:  Normal  Mood:  NA  Affect:  Appropriate  Thought Process:  Goal Directed  Orientation:  Full (Time, Place, and Person)  Thought Content:  WDL  Suicidal Thoughts:  No  Homicidal Thoughts:  No  Memory:    Judgement:  Intact  Insight:  Fair  Psychomotor Activity:  NA  Concentration:    Recall:  Good  Fund of Knowledge:Good  Language: Poor  Akathisia:  No  Handed:    AIMS (if indicated):    Assets:  Communication Skills  ADL's:  Intact  Cognition: WNL  Sleep:      Treatment Plan Summary:  At this time the patient is doing extremely well. She has no psychopathology at all. It is likely that her doctors overtreated her and her seizure might of been related to more likely to a stroke. At this  time I've no evidence that she has a depression disorder or psychotic disorder. There is no reason for this patient be on Cymbalta her Seroquel at this time. The patient is not even interested in therapy. She says her life is going great. She is a good support with 2 daughters and enjoys her grandchildren.She is very stable. She denies any physical complaints. At this time the patient was told that if anything occurs new problems recur to please call us back and be seen again. At this time she'll and her care here.   Jerral Ralph, MD 4/11/20194:26 PM

## 2017-10-14 ENCOUNTER — Encounter: Payer: Self-pay | Admitting: Family Medicine

## 2017-10-14 ENCOUNTER — Ambulatory Visit: Payer: Self-pay | Admitting: *Deleted

## 2017-10-14 ENCOUNTER — Ambulatory Visit (INDEPENDENT_AMBULATORY_CARE_PROVIDER_SITE_OTHER): Payer: Medicare Other | Admitting: Family Medicine

## 2017-10-14 VITALS — BP 100/62 | HR 76 | Temp 98.4°F | Wt 273.0 lb

## 2017-10-14 DIAGNOSIS — R58 Hemorrhage, not elsewhere classified: Secondary | ICD-10-CM

## 2017-10-14 DIAGNOSIS — T148XXA Other injury of unspecified body region, initial encounter: Secondary | ICD-10-CM

## 2017-10-14 NOTE — Progress Notes (Signed)
Subjective:    Patient ID: Brittany Roberts, female    DOB: [birth year], 77 y.o.   MRN: [id number]  No chief complaint on file. Patient is accompanied by her daughter.  HPI Patient was seen today for acute concern. Pt tripped over the oxygen tubing at home while walking to the bathroom in the dark.  Pt denies LOC or hitting her head.. Pt bruised her left breast and L wrist.  She also scraped her left knee.  Patient has been putting antibiotic ointment on her left knee and a Band-Aid.  She started leaving her knee uncovered yesterday.  Patient's daughter accompanied her to this appointment.  She states she just found out about the fall today.  Patient takes aspirin 81 mg daily  Past Medical History:  Diagnosis Date  . Acute delirium 01/14/2017  . Anxiety   . Arthritis    "all over"  . Asthma   . Bipolar disorder ([place])   . Complication of anesthesia    "w/right foot OR they gave me too much and they couldn't get me woke"  . Congestive heart failure, unspecified   . Coronary artery calcification seen on CAT scan 01/14/2017  . Coronary artery disease    "I've got 1 stent" (06/30/2015)  . Depression   . Discoid lupus   . Fibromyalgia    "I've been told I have this; can't take Lyrica cause I'm allergic to it" (06/30/2015)  . GERD (gastroesophageal reflux disease)   . History of gout   . History of hiatal hernia    "real bad" (06/30/2015)  . Hypertension   . Hypothyroidism   . On home oxygen therapy    "2L q hs" (06/30/2015)  . OSA (obstructive sleep apnea)    "just use my oxygen; no mask" (06/30/2015)  . Other and unspecified hyperlipidemia   . Penetrating foot wound    left nonhealing foot wound on the dorsal surface  . Peripheral neuropathy   . Pneumonia "many of times"  . Refusal of blood transfusions as patient is Jehovah's Witness   . Renal failure, unspecified    "kidneys not working 100%" (06/30/2015)  . Sickle cell trait ([place])   . Systemic lupus ([place])   . Thoracic aortic  atherosclerosis ([place]) 01/14/2017  . Type II diabetes mellitus ([place])   . Unspecified vitamin D deficiency     Allergies  Allergen Reactions  . Ace Inhibitors Other (See Comments)    Coincided with sig bump in creat. Retried and creat bumped again.  Beulah Gandy [Isosorb Dinitrate-Hydralazine] Other (See Comments)    Sleep all the time.  Marland Kitchen Penicillins Anaphylaxis, Swelling and Rash    Has patient had a PCN reaction causing immediate rash, facial/tongue/throat swelling, SOB or lightheadedness with hypotension: Yes Has patient had a PCN reaction causing severe rash involving mucus membranes or skin necrosis: Yes Has patient had a PCN reaction that required hospitalization: Yes Has patient had a PCN reaction occurring within the last 10 years: No If all of the above answers are "NO", then may proceed with Cephalosporin use.   Ebbie Ridge [Buspirone] Other (See Comments)    pain  . Pregabalin Swelling  . Ropinirole Hydrochloride Swelling  . Amantadines Rash    "Swelling of the tongue"    ROS General: Denies fever, chills, night sweats, changes in weight, changes in appetite HEENT: Denies headaches, ear pain, changes in vision, rhinorrhea, sore throat CV: Denies CP, palpitations, SOB, orthopnea Pulm: Denies SOB, cough, wheezing GI: Denies abdominal pain, nausea,  vomiting, diarrhea, constipation GU: Denies dysuria, hematuria, frequency, vaginal discharge Msk: Denies muscle cramps, joint pains Neuro: Denies weakness, numbness, tingling Skin: Denies rashes +bruising, scraped L knee, ecchymosis Psych: Denies depression, anxiety, hallucinations     Objective:    Blood pressure 100/62, pulse 76, temperature 98.4 F (36.9 C), temperature source Oral, weight 273 lb (123.8 kg), SpO2 94 %.   Gen. Pleasant, well-nourished, in no distress, normal affect   HEENT: Wearing glasses /AT, face symmetric, conjunctiva clear, no scleral icterus, PERRLA,  nares patent without drainage, pharynx without  erythema or exudate. Lungs: no accessory muscle use, CTAB Cardiovascular: RRR, no peripheral edema Neuro:  A&Ox3, CN II-XII intact, ambulates with assistance from Rollator Skin:  Warm, dry.  Left knee small abrasion, healing with eschar in place.  Left wrist with small area of ecchymosis.  Left breast with a large area of ecchymosis and induration within the center.   Wt Readings from Last 3 Encounters:  09/06/17 269 lb 8 oz (122.2 kg)  09/01/17 269 lb 13.5 oz (122.4 kg)  07/19/17 273 lb 2 oz (123.9 kg)    Lab Results  Component Value Date   WBC 7.3 09/06/2017   HGB 11.1 (L) 09/06/2017   HCT 34.2 (L) 09/06/2017   PLT 255.0 09/06/2017   GLUCOSE 74 09/06/2017   CHOL 103 07/19/2014   TRIG 81 07/19/2014   HDL 38 (L) 07/19/2014   LDLDIRECT 59 02/16/2013   LDLCALC 49 07/19/2014   ALT 14 09/06/2017   AST 24 09/06/2017   NA 139 09/06/2017   K 4.2 09/06/2017   CL 109 09/06/2017   CREATININE 1.53 (H) 09/06/2017   BUN 28 (H) 09/06/2017   CO2 21 09/06/2017   TSH 0.63 04/08/2017   INR 1.05 06/30/2015   HGBA1C 6.5 07/19/2017   MICROALBUR 1.82 11/23/2013    Assessment/Plan:  Ecchymosis -Discussed area will be sore for several days to even weeks. -Patient can apply ice to the areas of ecchymosis.  Abrasion -Continue keeping wound on left knee clean, dry. -Okay to continue using antibiotic ointment. -ok to leave area uncovered to get air.  Discussed RTC or ED precautions. -Patient given RTC or ED precautions and advised to look for signs of infection including increased purulent drainage, erythema, induration  Follow-up PRN.  Pt encouraged to use it nightly for nocturnal lites while moving around in the dark at night.  Grier Mitts, MD

## 2017-10-14 NOTE — Telephone Encounter (Signed)
I returned her call.   C/O having a bruise and soreness in her left breast from a fall on Tuesday morning while going to the bathroom.  She also tore the skin off of her left knee and has a bruise on her left arm from trying to grab the door to break her fall.   "I'm sore".   "I'm mostly concerned about my breast being so bruised and sore with a knot in it".  I made her an appt with Dr. Volanda Napoleon since Dr. [name] was booked for today at 2:00.   She is going to call her daughter to come take her to the appt.   If for some reason her daughter can't take her at that time I asked her to call us back to reschedule.  She verbalized understanding.    Reason for Disposition . Breast lump    [place] on Tuesday going to the bathroom and hit her breast on the floor.   Now has a knot and bruising on her left breast.  Answer Assessment - Initial Assessment Questions 1. SYMPTOM: "What's the main symptom you're concerned about?"  (e.g., lump, pain, rash, nipple discharge)     I tripped while going to the bathroom during the night.  I use oxygen.   I tripped going into the bathroom.   I was able to get myself up by myself but usually 911 has to come get me up.   Did not hit my head.   But tore skin off my left knee, also a bruise on left arm and left breast.   This morning my left breast has a knot in it.   I'm sore.    2. LOCATION: "Where is the _______ located?"     See above.     I take an aspirin low dose daily. 3. ONSET: "When did ________  start?"     Fell Tuesday morning about 6:00am.   4. PRIOR HISTORY: "Do you have any history of prior problems with your breasts?" (e.g., lumps, cancer, fibrocystic breast disease)     I hit my breast on the floor as well as my arm and knee on the left.   I tried to grab the door to catch myself. 5. CAUSE: "What do you think is causing this symptom?"     I fell. 6. OTHER SYMPTOMS: "Do you have any other symptoms?" (e.g., fever, breast pain, redness or rash, nipple discharge)  My left breast is really bruised.   No open skin on breast.   I just have a knot located on the front of my breast. 7. PREGNANCY-BREASTFEEDING: "Is there any chance you are pregnant?" "When was your last menstrual period?" "Are you breastfeeding?"     N/A  Protocols used: BREAST [facility]

## 2017-10-14 NOTE — Telephone Encounter (Signed)
This encounter was created in error - please disregard.

## 2017-10-15 ENCOUNTER — Other Ambulatory Visit: Payer: Self-pay | Admitting: Family Medicine

## 2017-10-15 DIAGNOSIS — E1149 Type 2 diabetes mellitus with other diabetic neurological complication: Secondary | ICD-10-CM

## 2017-10-17 NOTE — Progress Notes (Signed)
HPI:   Brittany Roberts is a 77 y.o. female, who is here today with her daughter for follow up.   She was last seen on 09/06/17, when she had multiple concerns.   She has Hx of gout and OA, last visit she was c/o persistent ankle pain after gout attack.  Acute gout has resolved. She is on Allopurinol 100 mg daily.   Having shoulder,knees,and ankles achy pain. It is exacerbated by walking,standing. Alleviated by rest. She has a walker for transfer.  Hx of fibromyalgia,she is on Gabapentin 300 mg bid.  She fell  Since her last OV she has followed with psychiatrist, Dr Casimiro Needle.Apparently she has ben on Cymbalta and Seroquel, which according to note were prescribed by her PCP. Dr Plovsky's opinion is that she has no depression or psychiatric issues at this time. She should not be on Cymbalta or Seroquel. These medications are not on her med list She is not on Cymbalta or Seroquel.  COPD: She is on Flovent ? Mcg bid and Albuterol prn. She is rinsing mouth after use.  Reporting improvement of dyspnea.  No chest pain,cough,or wheezing. Allergies, she did not start Singulair   DM II:  She is on Metformin 500 mg bid and NPH insulin 30 U daily. Denies abdominal pain, nausea,vomiting, polydipsia,polyuria, or polyphagia. No hypoglycemia.  She is not very consistent with her diet.  Hypertension:    Currently on Amlodipine 10 mg daily and Metoprolol tartrate 50 mg qd. CHF, she follows with cardiologist.  She is taking medications as instructed, no side effects reported.  She has not noted unusual headache, visual changes, exertional chest pain, dyspnea,  focal weakness, or edema.   Lab Results  Component Value Date   CREATININE 1.53 (H) 09/06/2017   BUN 28 (H) 09/06/2017   NA 139 09/06/2017   K 4.2 09/06/2017   CL 109 09/06/2017   CO2 21 09/06/2017      Review of Systems  Constitutional: Negative for activity change, appetite change, fatigue and fever.  HENT:  Negative for mouth sores, nosebleeds and trouble swallowing.   Eyes: Negative for redness and visual disturbance.  Respiratory: Negative for cough, shortness of breath and wheezing.   Cardiovascular: Negative for chest pain, palpitations and leg swelling.  Gastrointestinal: Negative for abdominal pain, nausea and vomiting.       Negative for changes in bowel habits.  Endocrine: Negative for polydipsia, polyphagia and polyuria.  Genitourinary: Negative for decreased urine volume, dysuria and hematuria.  Musculoskeletal: Positive for arthralgias, gait problem and myalgias.  Skin: Negative for rash and wound.  Neurological: Negative for syncope, weakness and headaches.  Psychiatric/Behavioral: Negative for confusion. The patient is nervous/anxious.      Current Outpatient Medications on File Prior to Visit  Medication Sig Dispense Refill  . ACCU-CHEK AVIVA PLUS test strip TEST three times a day 300 each 3  . acetaminophen (TYLENOL) 325 MG tablet Take 650 mg by mouth every 6 (six) hours as needed for mild pain or moderate pain.    Marland Kitchen albuterol (PROAIR HFA) 108 (90 Base) MCG/ACT inhaler inhale 2 puffs by mouth every 4 hours if needed USE ONLY IF YOU ARE WHEEZING 3 Inhaler 3  . Ascorbic Acid (VITAMIN C PO) Take 1 tablet by mouth daily.    Marland Kitchen aspirin EC 81 MG tablet Take 162 mg by mouth daily.     . B Complex Vitamins (VITAMIN B COMPLEX) TABS Take 1 tablet by mouth daily.    Marland Kitchen  budesonide-formoterol (SYMBICORT) 160-4.5 MCG/ACT inhaler Inhale 2 puffs into the lungs 2 (two) times daily. 1 Inhaler 3  . Cholecalciferol (VITAMIN D3) 5000 UNITS CAPS Take 5,000 Units by mouth daily.    . colchicine 0.6 MG tablet 1 tab bid for 1 week then prn 2 tabs once when acute gout onset (no more than 2 tabs per day). 45 tablet 0  . Flaxseed, Linseed, (FLAXSEED OIL PO) Take 1 capsule by mouth daily.    . furosemide (LASIX) 40 MG tablet Take 1 tablet (40 mg total) by mouth daily. (hold this for 2 days, then resume.   Please follow up with PCP for repeat labs and long term plan) 30 tablet 0  . gabapentin (NEURONTIN) 300 MG capsule Take 1 capsule (300 mg total) by mouth 2 (two) times daily. 180 capsule 3  . guaiFENesin (ROBITUSSIN) 100 MG/5ML liquid Take 200 mg by mouth 3 (three) times daily as needed for cough.    . Insulin Pen Needle (PEN NEEDLES 31GX5/16") 31G X 8 MM MISC Check Blood sugar three times per day. Dx insulin using E11.8 100 each 3  . levETIRAcetam (KEPPRA) 500 MG tablet Take 1 tablet (500 mg total) by mouth 2 (two) times daily. 60 tablet 11  . metoprolol tartrate (LOPRESSOR) 50 MG tablet TAKE 1 TABLET BY MOUTH ONCE DAILY WITH OR IMMEDIATELY FOLLOWING A MEAL FOR HYPERTENSION 30 tablet 0  . montelukast (SINGULAIR) 10 MG tablet Take 1 tablet (10 mg total) by mouth at bedtime. 30 tablet 0  . Multiple Vitamin (MULTIVITAMIN WITH MINERALS) TABS tablet Take 1 tablet by mouth daily.    . nitroGLYCERIN (NITROSTAT) 0.4 MG SL tablet Place 1 tablet (0.4 mg total) under the tongue every 5 (five) minutes as needed for chest pain. 20 tablet 12  . Polyethyl Glycol-Propyl Glycol (SYSTANE ULTRA OP) Place 1 drop into both eyes every 6 (six) hours as needed (dry eyes).     No current facility-administered medications on file prior to visit.      Past Medical History:  Diagnosis Date  . Acute delirium 01/14/2017  . Anxiety   . Arthritis    "all over"  . Asthma   . Bipolar disorder ([place])   . Complication of anesthesia    "w/right foot OR they gave me too much and they couldn't get me woke"  . Congestive heart failure, unspecified   . Coronary artery calcification seen on CAT scan 01/14/2017  . Coronary artery disease    "I've got 1 stent" (06/30/2015)  . Depression   . Discoid lupus   . Fibromyalgia    "I've been told I have this; can't take Lyrica cause I'm allergic to it" (06/30/2015)  . GERD (gastroesophageal reflux disease)   . History of gout   . History of hiatal hernia    "real bad" (06/30/2015)    . Hypertension   . Hypothyroidism   . On home oxygen therapy    "2L q hs" (06/30/2015)  . OSA (obstructive sleep apnea)    "just use my oxygen; no mask" (06/30/2015)  . Other and unspecified hyperlipidemia   . Penetrating foot wound    left nonhealing foot wound on the dorsal surface  . Peripheral neuropathy   . Pneumonia "many of times"  . Refusal of blood transfusions as patient is Jehovah's Witness   . Renal failure, unspecified    "kidneys not working 100%" (06/30/2015)  . Sickle cell trait ([place])   . Systemic lupus ([place])   . Thoracic aortic  atherosclerosis ([place]) 01/14/2017  . Type II diabetes mellitus ([place])   . Unspecified vitamin D deficiency    Allergies  Allergen Reactions  . Ace Inhibitors Other (See Comments)    Coincided with sig bump in creat. Retried and creat bumped again.  Beulah Gandy [Isosorb Dinitrate-Hydralazine] Other (See Comments)    Sleep all the time.  Marland Kitchen Penicillins Anaphylaxis, Swelling and Rash    Has patient had a PCN reaction causing immediate rash, facial/tongue/throat swelling, SOB or lightheadedness with hypotension: Yes Has patient had a PCN reaction causing severe rash involving mucus membranes or skin necrosis: Yes Has patient had a PCN reaction that required hospitalization: Yes Has patient had a PCN reaction occurring within the last 10 years: No If all of the above answers are "NO", then may proceed with Cephalosporin use.   Ebbie Ridge [Buspirone] Other (See Comments)    pain  . Pregabalin Swelling  . Ropinirole Hydrochloride Swelling  . [place]adines Rash    "Swelling of the tongue"    Social History   Socioeconomic History  . Marital status: Single    Spouse name: Not on file  . Number of children: 4  . Years of education: 41 th  . Highest education level: Not on file  Occupational History  . Occupation: Retired- Engineer, production    Employer: RETIRED  Social Needs  . Financial resource strain: Not on file  . Food insecurity:     Worry: Not on file    Inability: Not on file  . Transportation needs:    Medical: Not on file    Non-medical: Not on file  Tobacco Use  . Smoking status: Former Smoker    Packs/day: 3.00    Years: 40.00    Pack years: 120.00    Types: Cigarettes    Start date: 07/05/1960    Last attempt to quit: 07/05/1998    Years since quitting: 19.3  . Smokeless tobacco: Never Used  Substance and Sexual Activity  . Alcohol use: No    Alcohol/week: 0.0 oz    Comment: 06/30/2015 "used to drink a little alcohol on the weekends; not q weekend; nothing in years"  . Drug use: No  . Sexual activity: Never  Lifestyle  . Physical activity:    Days per week: Not on file    Minutes per session: Not on file  . Stress: Not on file  Relationships  . Social connections:    Talks on phone: Not on file    Gets together: Not on file    Attends religious service: Not on file    Active member of club or organization: Not on file    Attends meetings of clubs or organizations: Not on file    Relationship status: Not on file  Other Topics Concern  . Not on file  Social History Narrative   Health Care POA:    Emergency Contact: daughter, Lorenso Courier [phone] or [name] [phone]   End of Life Plan: Does not want to be ventilated or feeding tubes.    Who lives with you: Lives alone   Any pets: none   Diet: Patient reports enjoying and eating junk food.  Does not regulate types of food and currently her dentures are broken so it is hard to eat some foods.   Exercise: Patient does not have an exercise plan.   Seatbelts: Patient reports wearing seatbelt when in vehicle.   Sun Exposure/Protection: Patient reports not going outside very often.   Hobbies: Patient  enjoys reading the bible and watching game shows.    Right-handed.   1 cup caffeine per day.   Former smoker-stopped 2000   Alcohol none    Vitals:   10/18/17 1028  BP: 122/86  Pulse: 66  Resp: 12  Temp: 98.7 F (37.1 C)  SpO2: 95%    Body mass index is 47.78 kg/m.     Physical Exam  Nursing note and vitals reviewed. Constitutional: She is oriented to person, place, and time. She appears well-developed. No distress.  HENT:  Head: Normocephalic and atraumatic.  Mouth/Throat: Oropharynx is clear and moist and mucous membranes are normal.  Edentulous.   Eyes: Pupils are equal, round, and reactive to light. Conjunctivae are normal.  Cardiovascular: Normal rate. An irregular rhythm present.  No murmur heard. Pulses:      Dorsalis pedis pulses are 2+ on the right side, and 2+ on the left side.  Respiratory: Effort normal and breath sounds normal. No respiratory distress.  GI: Soft. She exhibits no mass. There is no hepatomegaly. There is no tenderness.  Musculoskeletal: She exhibits no edema.  Crepitus knees, mild pain elicited with movement. Ankle no edema normal ROM.   Lymphadenopathy:    She has no cervical adenopathy.  Neurological: She is alert and oriented to person, place, and time. She has normal strength. Coordination normal.  Skin: Skin is warm. No erythema.  Psychiatric: Her mood appears anxious.  Well groomed, good eye contact.      ASSESSMENT AND PLAN:   Ms. WILEY FLICKER was seen today for follow-up.  No orders of the defined types were placed in this encounter.   Chronic kidney disease, stage III (moderate) (HCC) Low salt diet and adequate hydration. Discontinue Metformin. Adequate HTN and DM controlled. F/U in 3 months.  OSTEOARTHROSIS, GENERALIZED, MULTIPLE SITES Tylenol 650 mg tid. OTC topical Icy hot or Asper cream may help. Fall prevention.  Essential hypertension Adequately controlled. No changes in current management. DASH-low salt diet recommended. Eye exam recommended annually. F/U in 3 months, before if needed.   Gout, arthropathy Educated about Dx. Continue Allopurinol. Colchicine as needed. Low purine diet to continue.  Controlled type 2 diabetes with  neuropathy (HCC) Because elevated Cr, Metformin discontinue. No changes in NPH insulin. If FG > 150 or postprandial > 190-200 we will increase dose of insulin. Annual eye exam and foot care recommended.  COPD (chronic obstructive pulmonary disease) ([place]) Improved. No changes in current management.        -Ms. TIDA SANER was advised to return sooner than planned today if new concerns arise.       Treveon Bourcier G. [name], MD  [facility]. [place] office.

## 2017-10-18 ENCOUNTER — Encounter: Payer: Self-pay | Admitting: Family Medicine

## 2017-10-18 ENCOUNTER — Ambulatory Visit (INDEPENDENT_AMBULATORY_CARE_PROVIDER_SITE_OTHER): Payer: Medicare Other | Admitting: Family Medicine

## 2017-10-18 VITALS — BP 122/86 | HR 66 | Temp 98.7°F | Resp 12 | Ht 63.5 in | Wt 274.0 lb

## 2017-10-18 DIAGNOSIS — M109 Gout, unspecified: Secondary | ICD-10-CM | POA: Diagnosis not present

## 2017-10-18 DIAGNOSIS — E1140 Type 2 diabetes mellitus with diabetic neuropathy, unspecified: Secondary | ICD-10-CM

## 2017-10-18 DIAGNOSIS — I10 Essential (primary) hypertension: Secondary | ICD-10-CM | POA: Diagnosis not present

## 2017-10-18 DIAGNOSIS — N183 Chronic kidney disease, stage 3 unspecified: Secondary | ICD-10-CM

## 2017-10-18 DIAGNOSIS — M159 Polyosteoarthritis, unspecified: Secondary | ICD-10-CM

## 2017-10-18 DIAGNOSIS — J449 Chronic obstructive pulmonary disease, unspecified: Secondary | ICD-10-CM | POA: Diagnosis not present

## 2017-10-18 MED ORDER — PANTOPRAZOLE SODIUM 40 MG PO TBEC: 40.0000 mg | DELAYED_RELEASE_TABLET | Freq: Every day | ORAL | 3 refills | Status: DC

## 2017-10-18 MED ORDER — ATORVASTATIN CALCIUM 20 MG PO TABS: 20.0000 mg | ORAL_TABLET | Freq: Every day | ORAL | 2 refills | Status: DC

## 2017-10-18 MED ORDER — ALLOPURINOL 100 MG PO TABS: 100.0000 mg | ORAL_TABLET | Freq: Every day | ORAL | 3 refills | Status: DC

## 2017-10-18 MED ORDER — INSULIN ISOPHANE HUMAN 100 UNIT/ML KWIKPEN: PEN_INJECTOR | SUBCUTANEOUS | 3 refills | Status: DC

## 2017-10-18 MED ORDER — AMLODIPINE BESYLATE 10 MG PO TABS: 10.0000 mg | ORAL_TABLET | Freq: Every day | ORAL | 2 refills | Status: DC

## 2017-10-18 NOTE — Assessment & Plan Note (Signed)
Adequately controlled. No changes in current management. DASH-low salt diet recommended. Eye exam recommended annually. F/U in 3 months, before if needed.

## 2017-10-18 NOTE — Assessment & Plan Note (Signed)
Educated about Dx. Continue Allopurinol. Colchicine as needed. Low purine diet to continue.

## 2017-10-18 NOTE — Assessment & Plan Note (Signed)
Low salt diet and adequate hydration. Discontinue Metformin. Adequate HTN and DM controlled. F/U in 3 months.

## 2017-10-18 NOTE — Patient Instructions (Addendum)
A few things to remember from today's visit:   OSTEOARTHROSIS, GENERALIZED, MULTIPLE SITES  Gout, arthropathy  DM (diabetes mellitus), type 2 with neurological complications ([place])  Allergic rhinitis, unspecified seasonality, unspecified trigger  Essential hypertension - Plan: amLODipine (NORVASC) 10 MG tablet  Metformin stopped. Aspirin 81 mg once daily   Please be sure medication list is accurate. If a new problem present, please set up appointment sooner than planned today.

## 2017-10-18 NOTE — Assessment & Plan Note (Signed)
Tylenol 650 mg tid. OTC topical Icy hot or Asper cream may help. Fall prevention.

## 2017-10-18 NOTE — Assessment & Plan Note (Signed)
Improved. No changes in current management.

## 2017-10-18 NOTE — Assessment & Plan Note (Signed)
Because elevated Cr, Metformin discontinue. No changes in NPH insulin. If FG > 150 or postprandial > 190-200 we will increase dose of insulin. Annual eye exam and foot care recommended.

## 2017-10-27 ENCOUNTER — Encounter: Payer: Self-pay | Admitting: Neurology

## 2017-10-27 ENCOUNTER — Other Ambulatory Visit: Payer: Self-pay

## 2017-10-27 ENCOUNTER — Ambulatory Visit: Payer: Self-pay | Admitting: *Deleted

## 2017-10-27 ENCOUNTER — Ambulatory Visit (INDEPENDENT_AMBULATORY_CARE_PROVIDER_SITE_OTHER): Payer: Medicare Other | Admitting: Neurology

## 2017-10-27 VITALS — BP 128/72 | HR 72 | Resp 18 | Ht 63.5 in | Wt 279.0 lb

## 2017-10-27 DIAGNOSIS — G40209 Localization-related (focal) (partial) symptomatic epilepsy and epileptic syndromes with complex partial seizures, not intractable, without status epilepticus: Secondary | ICD-10-CM

## 2017-10-27 MED ORDER — LEVETIRACETAM 500 MG PO TABS: 500.0000 mg | ORAL_TABLET | Freq: Two times a day (BID) | ORAL | 3 refills | Status: DC

## 2017-10-27 NOTE — Telephone Encounter (Signed)
Patient  Was  Seen today - by  Neurologist  And was  Advised to notify PCP of  Symptoms  Pt  Denies  Any facial  Swelling or  Respiratory  Distress. Appointment  Made  For  Monday  With Dr [name]    Reason for Disposition . Diagnosis of eye allergy never confirmed  Answer Assessment - Initial Assessment Questions 1. SEVERITY: "How bad is the itching?"  (e.g., Scale 1-10; mild, moderate or severe)       Eyes   Itching  Pt reports  Pt  Reports slight  Eye  Pain  When watches  tv both pt also  Reports  Some  Blurred  Vision   2. ONSET: "When did the eye symptoms start?" (e.g., hours or days ago)    Pt reports  Symptoms  Started  sev weeks ago after starting  Allopurinol   3. EYELIDS: "Are the eyelids swollen?" If so, ask: "How much?"       No  4. EYE DISCHARGE: "Is there any discharge from the eye, or eyelid crusting ?" If so, ask: "How much?"      Has some drainage and crusty as well worse going on for several months  5. TRIGGER: "What do you think triggered the allergic reaction?" (e.g., dust, smoke, pollen, new eye make-up)        Pt thinks it may be  From medication  6. RECURRENT PROBLEM: "Have you experienced eye allergies before?" If so, ask: "When was the last time?" and "What medicine worked best in the past?"     Not with eyes  Or mouth  7. OTHER SYMPTOMS: "Do you have any other symptoms?" (e.g., runny nose)     Weak  -  Mouth felt gritty last night  Better today  8. PREGNANCY: "Is there any chance you are pregnant?" "When was your last menstrual period?"     N/a  Protocols used: EYE - ALLERGY-A-AH

## 2017-10-27 NOTE — Progress Notes (Signed)
NEUROLOGY FOLLOW UP OFFICE NOTE  [name] [id number] [birth year]  HISTORY OF PRESENT ILLNESS: I had the pleasure of seeing Brittany Roberts in follow-up in the neurology clinic on 10/27/2017. She is again alone in the office today. The patient was last seen 6 months ago for new onset seizure last 01/13/17. She had been confused for a few weeks, then in the ER was witnessed to have jerking with tonic head deviation to the left. MRI brain showed focal encephalomalacia in the right frontal operculum, superior insular cortex. EEG showed diffuse slowing. She was discharged home on Keppra 500mg  BID. She continues to deny any further seizures or seizure-like symptoms since then. No side effects on Keppra. She lives alone, her daughter lives very close by and fixes her pillbox weekly. She takes her medications by herself. She denies any olfactory/gustatory hallucinations, focal numbness/tingling/weakness, confusion, myoclonic jerks, waking up with tongue bite/incontinence. She denies any headaches, dizziness. She reports a week of bowel incontinence 2-3 months ago that self-resolved. She is concerned that after an hour of eating, she would be hungry and feel shaky. Her Metformin has been stopped and she took her sugar levels daily with sugar levels never going above 200. She has a follow-up with Dr. [name] soon. She was tearful in the office on her last visit, more upbeat today. She would like to return to driving, stating that family does not want to take her to [place], when asked why, she states that she was told she would be needlessly spending money. There is no family today to help with history. She takes [employer] for transportation. She fell 2 weeks ago tripping on her O2 tube to the bathroom. She uses her walker, reporting pain in her knees and ankles, L>R from gout and osteoarthritis.  HPI 04/25/2017: This is a 77 yo RH woman with a complicated medical history including hypertension, hyperlipidemia, diabetes,  hypothyroidism, COPD, lupus, chronic pain, schizophrenia, who had seizure-like activity last 01/13/2017. She recalls she was hurting all over the night before and could not get comfortable, she walked the floors all night, recalls sitting in her recliner, then waking up in the hospital. She reports that her daughter called and no one picked up the phone. Per ER notes, she had a witnessed episode of jerking movements with tonic head deviation to the left lasting 2-5 minutes, followed by confusion, agitation, then somnolence. She had urinary incontinence. Family reported she was becoming progressively confused for 2 weeks prior to the seizure. She had been recently started on Cymbalta and Seroquel, which were discontinued in the hospital, in addition to Trazodone and Tramadol. She had an MRI brain with and without contrast which I personally reviewed, no acute changes, there was focal encephalomalacia in the right frontal operculum, superior insular cortex, with ex vacuo dilation of the right lateral ventricle. There was moderate chronic microvascular disease. No abnormal enhancement. Her EEG showed diffuse slowing, no epileptiform discharges. She was discharged home on Keppra 500mg  BID.  She presents today alone in the office, as far as she knows she has not had any further seizures. She denies any side effects to the Keppra. She lives alone, but her children come twice a day to administer medications. She reports that prior to hospital stay, she was in charge of her medications and that people were concerned she was overmedicated and taking too much medication. She had also been driving prior to the seizure, but has stopped since then. She denies being told of any staring/unresponsive episodes,  she denies any gaps in time, olfactory/gustatory hallucinations, rising epigastric sensation, myoclonic jerks. She has neuropathy in both feet and takes gabapentin 300mg  BID. She has chronic pain from osteoarthritis, and  reports her right leg occasionally just gives out on her. She reports "I don't have much memory," and became tearful in the office, reporting thoughts of worthlessness because she has been so sick. She states "I don't think much of myself." She denies any suicidal ideation.   Epilepsy Risk Factors: Right frontal encephalomalacia. Her brother had seizures in childhood and died from a tractor accident when he had a seizure. Otherwise she had a normal birth and early development, no history of febrile convulsions, CNS infections, significant head injuries, or neurosurgical procedures.   PAST MEDICAL HISTORY: Past Medical History:  Diagnosis Date  . Acute delirium 01/14/2017  . Anxiety   . Arthritis    "all over"  . Asthma   . Bipolar disorder ([place])   . Complication of anesthesia    "w/right foot OR they gave me too much and they couldn't get me woke"  . Congestive heart failure, unspecified   . Coronary artery calcification seen on CAT scan 01/14/2017  . Coronary artery disease    "I've got 1 stent" (06/30/2015)  . Depression   . Discoid lupus   . Fibromyalgia    "I've been told I have this; can't take Lyrica cause I'm allergic to it" (06/30/2015)  . GERD (gastroesophageal reflux disease)   . History of gout   . History of hiatal hernia    "real bad" (06/30/2015)  . Hypertension   . Hypothyroidism   . On home oxygen therapy    "2L q hs" (06/30/2015)  . OSA (obstructive sleep apnea)    "just use my oxygen; no mask" (06/30/2015)  . Other and unspecified hyperlipidemia   . Penetrating foot wound    left nonhealing foot wound on the dorsal surface  . Peripheral neuropathy   . Pneumonia "many of times"  . Refusal of blood transfusions as patient is Jehovah's Witness   . Renal failure, unspecified    "kidneys not working 100%" (06/30/2015)  . Sickle cell trait ([place])   . Systemic lupus ([place])   . Thoracic aortic atherosclerosis ([place]) 01/14/2017  . Type II diabetes mellitus ([place])   .  Unspecified vitamin D deficiency     MEDICATIONS: Current Outpatient Medications on File Prior to Visit  Medication Sig Dispense Refill  . ACCU-CHEK AVIVA PLUS test strip TEST three times a day 300 each 3  . acetaminophen (TYLENOL) 325 MG tablet Take 650 mg by mouth every 6 (six) hours as needed for mild pain or moderate pain.    Marland Kitchen albuterol (PROAIR HFA) 108 (90 Base) MCG/ACT inhaler inhale 2 puffs by mouth every 4 hours if needed USE ONLY IF YOU ARE WHEEZING 3 Inhaler 3  . allopurinol (ZYLOPRIM) 100 MG tablet Take 1 tablet (100 mg total) by mouth daily. 90 tablet 3  . amLODipine (NORVASC) 10 MG tablet Take 1 tablet (10 mg total) by mouth daily. 90 tablet 2  . Ascorbic Acid (VITAMIN C PO) Take 1 tablet by mouth daily.    Marland Kitchen aspirin EC 81 MG tablet Take 162 mg by mouth daily.     Marland Kitchen atorvastatin (LIPITOR) 20 MG tablet Take 1 tablet (20 mg total) by mouth daily. 90 tablet 2  . B Complex Vitamins (VITAMIN B COMPLEX) TABS Take 1 tablet by mouth daily.    . budesonide-formoterol (SYMBICORT) 160-4.5 MCG/ACT  inhaler Inhale 2 puffs into the lungs 2 (two) times daily. 1 Inhaler 3  . Cholecalciferol (VITAMIN D3) 5000 UNITS CAPS Take 5,000 Units by mouth daily.    . colchicine 0.6 MG tablet 1 tab bid for 1 week then prn 2 tabs once when acute gout onset (no more than 2 tabs per day). 45 tablet 0  . Flaxseed, Linseed, (FLAXSEED OIL PO) Take 1 capsule by mouth daily.    . furosemide (LASIX) 40 MG tablet Take 1 tablet (40 mg total) by mouth daily. (hold this for 2 days, then resume.  Please follow up with PCP for repeat labs and long term plan) 30 tablet 0  . gabapentin (NEURONTIN) 300 MG capsule Take 1 capsule (300 mg total) by mouth 2 (two) times daily. 180 capsule 3  . guaiFENesin (ROBITUSSIN) 100 MG/5ML liquid Take 200 mg by mouth 3 (three) times daily as needed for cough.    . Insulin NPH, Human,, Isophane, (HUMULIN N KWIKPEN) 100 UNIT/ML Kiwkpen INJECT 30 UNITS SUBCUTANEOUSLY EVERY MORNING 30 mL 3  .  Insulin Pen Needle (PEN NEEDLES 31GX5/16") 31G X 8 MM MISC Check Blood sugar three times per day. Dx insulin using E11.8 100 each 3  . levETIRAcetam (KEPPRA) 500 MG tablet Take 1 tablet (500 mg total) by mouth 2 (two) times daily. 60 tablet 11  . metoprolol tartrate (LOPRESSOR) 50 MG tablet TAKE 1 TABLET BY MOUTH ONCE DAILY WITH OR IMMEDIATELY FOLLOWING A MEAL FOR HYPERTENSION 30 tablet 0  . montelukast (SINGULAIR) 10 MG tablet Take 1 tablet (10 mg total) by mouth at bedtime. 30 tablet 0  . Multiple Vitamin (MULTIVITAMIN WITH MINERALS) TABS tablet Take 1 tablet by mouth daily.    . nitroGLYCERIN (NITROSTAT) 0.4 MG SL tablet Place 1 tablet (0.4 mg total) under the tongue every 5 (five) minutes as needed for chest pain. 20 tablet 12  . pantoprazole (PROTONIX) 40 MG tablet Take 1 tablet (40 mg total) by mouth daily. 90 tablet 3  . Polyethyl Glycol-Propyl Glycol (SYSTANE ULTRA OP) Place 1 drop into both eyes every 6 (six) hours as needed (dry eyes).     No current facility-administered medications on file prior to visit.     ALLERGIES: Allergies  Allergen Reactions  . Ace Inhibitors Other (See Comments)    Coincided with sig bump in creat. Retried and creat bumped again.  Beulah Gandy [Isosorb Dinitrate-Hydralazine] Other (See Comments)    Sleep all the time.  Marland Kitchen Penicillins Anaphylaxis, Swelling and Rash    Has patient had a PCN reaction causing immediate rash, facial/tongue/throat swelling, SOB or lightheadedness with hypotension: Yes Has patient had a PCN reaction causing severe rash involving mucus membranes or skin necrosis: Yes Has patient had a PCN reaction that required hospitalization: Yes Has patient had a PCN reaction occurring within the last 10 years: No If all of the above answers are "NO", then may proceed with Cephalosporin use.   Ebbie Ridge [Buspirone] Other (See Comments)    pain  . Pregabalin Swelling  . Ropinirole Hydrochloride Swelling  . Amantadines Rash    "Swelling of the  tongue"    FAMILY HISTORY: Family History  Problem Relation Age of Onset  . Heart disease Mother   . Diabetes Mother   . Clotting disorder Mother   . Pneumonia Father   . Rheum arthritis Father   . Diabetes Sister   . Diabetes Sister   . Asthma Brother   . Cancer Brother   . Kidney disease Brother   .  Lupus Son   . Heart disease Son   . Heart disease Daughter   . Colon cancer Neg Hx     SOCIAL HISTORY: Social History   Socioeconomic History  . Marital status: Single    Spouse name: Not on file  . Number of children: 4  . Years of education: 69 th  . Highest education level: Not on file  Occupational History  . Occupation: Retired- Engineer, production    Employer: RETIRED  Social Needs  . Financial resource strain: Not on file  . Food insecurity:    Worry: Not on file    Inability: Not on file  . Transportation needs:    Medical: Not on file    Non-medical: Not on file  Tobacco Use  . Smoking status: Former Smoker    Packs/day: 3.00    Years: 40.00    Pack years: 120.00    Types: Cigarettes    Start date: 07/05/1960    Last attempt to quit: 07/05/1998    Years since quitting: 19.3  . Smokeless tobacco: Never Used  Substance and Sexual Activity  . Alcohol use: No    Alcohol/week: 0.0 oz    Comment: 06/30/2015 "used to drink a little alcohol on the weekends; not q weekend; nothing in years"  . Drug use: No  . Sexual activity: Never  Lifestyle  . Physical activity:    Days per week: Not on file    Minutes per session: Not on file  . Stress: Not on file  Relationships  . Social connections:    Talks on phone: Not on file    Gets together: Not on file    Attends religious service: Not on file    Active member of club or organization: Not on file    Attends meetings of clubs or organizations: Not on file    Relationship status: Not on file  . Intimate partner violence:    Fear of current or ex partner: Not on file    Emotionally abused: Not on file      Physically abused: Not on file    Forced sexual activity: Not on file  Other Topics Concern  . Not on file  Social History Narrative   Health Care POA:    Emergency Contact: daughter, Lorenso Courier [phone] or [name] [phone]   End of Life Plan: Does not want to be ventilated or feeding tubes.    Who lives with you: Lives alone   Any pets: none   Diet: Patient reports enjoying and eating junk food.  Does not regulate types of food and currently her dentures are broken so it is hard to eat some foods.   Exercise: Patient does not have an exercise plan.   Seatbelts: Patient reports wearing seatbelt when in vehicle.   Sun Exposure/Protection: Patient reports not going outside very often.   Hobbies: Patient enjoys reading the bible and watching game shows.    Right-handed.   1 cup caffeine per day.   Former smoker-stopped 2000   Alcohol none    REVIEW OF SYSTEMS: Constitutional: No fevers, chills, or sweats, no generalized fatigue, change in appetite Eyes: No visual changes, double vision, eye pain Ear, nose and throat: No hearing loss, ear pain, nasal congestion, sore throat Cardiovascular: No chest pain, palpitations Respiratory:  No shortness of breath at rest or with exertion, wheezes GastrointestinaI: No nausea, vomiting, diarrhea, abdominal pain, fecal incontinence Genitourinary:  No dysuria, urinary retention or frequency Musculoskeletal:  + neck  pain, back pain Integumentary: No rash, pruritus, skin lesions Neurological: as above Psychiatric: No depression, insomnia, anxiety Endocrine: No palpitations, fatigue, diaphoresis, mood swings, change in appetite, change in weight, increased thirst Hematologic/Lymphatic:  No anemia, purpura, petechiae. Allergic/Immunologic: no itchy/runny eyes, nasal congestion, recent allergic reactions, rashes  PHYSICAL EXAM: Vitals:   10/27/17 0925  BP: 128/72  Pulse: 72  Resp: 18   General: No acute distress Head:   Normocephalic/atraumatic Neck: supple, no paraspinal tenderness, full range of motion Heart:  Regular rate and rhythm Lungs:  Clear to auscultation bilaterally Back: No paraspinal tenderness Skin/Extremities: No rash, no edema Neurological Exam: alert and oriented to person, place, and time. No aphasia or dysarthria. Fund of knowledge is appropriate.  Recent and remote memory are intact.  Attention and concentration are normal.    Able to name objects and repeat phrases. CDT 5/5 MMSE - Mini Mental State Exam 10/27/2017 10/15/2010  Orientation to time 5 5  Orientation to Place 5 5  Registration 3 1  Attention/ Calculation 5 4  Recall 3 1  Language- name 2 objects 2 2  Language- repeat 1 1  Language- follow 3 step command 3 3  Language- read & follow direction 1 1  Write a sentence 1 1  Copy design 1 1  Total score 30 25   Cranial nerves: Pupils equal, round, reactive to light.  Extraocular movements intact with no nystagmus. Visual fields full. Facial sensation intact. No facial asymmetry. Tongue, uvula, palate midline.  Motor: Bulk and tone normal, muscle strength 5/5 throughout with no pronator drift.  Sensation to light touch intact.  No extinction to double simultaneous stimulation.  Deep tendon reflexes 2+ throughout, toes downgoing.  Finger to nose testing intact.  Gait slow and cautious due to knee and ankle pain, unable to tandem walk.  Romberg negative.  IMPRESSION: This is a pleasant 77 yo RH woman with a complicated medical history including hypertension, hyperlipidemia, diabetes, hypothyroidism, COPD, lupus, chronic pain, schizophrenia, who had an episode concerning for seizure last 01/13/2017. She was described to have body jerking with head version to the left. Family was reporting worsening confusion for 2 weeks prior to the seizure. Semiology of seizure suggestive of right hemisphere onset, she has encephalomalacia in the right frontal region. She was unaware of a prior history of  stroke. EEG showed diffuse slowing. Would continue Keppra 500mg  BID, she denies side effects. She is anxious to return to driving, but without family to help with history (ie cognitive status), it is difficult to release her to drive at this time. Her MMSE today is normal 30/30. I will contact her daughter, at this point recommend a DMV driving evaluation before return to driving. She is aware of [place] driving laws to stop driving after a seizure, until 6 months seizure-free. She will follow-up in 6 months and knows to call for any changes.   Thank you for allowing me to participate in her care.  Please do not hesitate to call for any questions or concerns.  The duration of this appointment visit was 30 minutes of face-to-face time with the patient.  Greater than 50% of this time was spent in counseling, explanation of diagnosis, planning of further management, and coordination of care.   Ellouise Newer, M.D.   CC: Dr. [name]

## 2017-10-27 NOTE — Patient Instructions (Signed)
1. Continue Keppra 500mg  twice a day 2. Recommend DMV driving evaluation if you would like to return to driving 3. Follow-up in 6 months, call for any changes  Seizure Precautions: 1. If medication has been prescribed for you to prevent seizures, take it exactly as directed.  Do not stop taking the medicine without talking to your doctor first, even if you have not had a seizure in a long time.   2. Avoid activities in which a seizure would cause danger to yourself or to others.  Don't operate dangerous machinery, swim alone, or climb in high or dangerous places, such as on ladders, roofs, or girders.  Do not drive unless your doctor says you may.  3. If you have any warning that you may have a seizure, lay down in a safe place where you can't hurt yourself.    4.  No driving for 6 months from last seizure, as per [facility].   Please refer to the following link on the [place] website for more information: http://[email or web address]/[email or web address]/drivingu.cfm   5.  Maintain good sleep hygiene. Avoid alcohol.  6.  Contact your doctor if you have any problems that may be related to the medicine you are taking.  7.  Call 911 and bring the patient back to the ED if:        A.  The seizure lasts longer than 5 minutes.       B.  The patient doesn't awaken shortly after the seizure  C.  The patient has new problems such as difficulty seeing, speaking or moving  D.  The patient was injured during the seizure  E.  The patient has a temperature over 102 F (39C)  F.  The patient vomited and now is having trouble breathing

## 2017-10-31 ENCOUNTER — Ambulatory Visit: Payer: Self-pay | Admitting: Family Medicine

## 2017-10-31 DIAGNOSIS — J45909 Unspecified asthma, uncomplicated: Secondary | ICD-10-CM | POA: Diagnosis not present

## 2017-11-02 ENCOUNTER — Encounter: Payer: Self-pay | Admitting: Family Medicine

## 2017-11-02 ENCOUNTER — Ambulatory Visit (INDEPENDENT_AMBULATORY_CARE_PROVIDER_SITE_OTHER): Payer: Medicare Other | Admitting: Family Medicine

## 2017-11-02 VITALS — BP 130/82 | HR 76 | Temp 98.4°F | Resp 12 | Ht 63.5 in | Wt 274.5 lb

## 2017-11-02 DIAGNOSIS — R5383 Other fatigue: Secondary | ICD-10-CM | POA: Diagnosis not present

## 2017-11-02 DIAGNOSIS — H1013 Acute atopic conjunctivitis, bilateral: Secondary | ICD-10-CM | POA: Diagnosis not present

## 2017-11-02 DIAGNOSIS — G4700 Insomnia, unspecified: Secondary | ICD-10-CM | POA: Diagnosis not present

## 2017-11-02 LAB — BASIC METABOLIC PANEL
BUN: 24 mg/dL — ABNORMAL HIGH (ref 6–23)
CO2: 28 mEq/L (ref 19–32)
Calcium: 8.7 mg/dL (ref 8.4–10.5)
Chloride: 104 mEq/L (ref 96–112)
Creatinine, Ser: 1.51 mg/dL — ABNORMAL HIGH (ref 0.40–1.20)
GFR: 42.86 mL/min — ABNORMAL LOW (ref >60.00)
Glucose, Bld: 142 mg/dL — ABNORMAL HIGH (ref 70–99)
Potassium: 3.4 mEq/L — ABNORMAL LOW (ref 3.5–5.1)
Sodium: 143 mEq/L (ref 135–145)

## 2017-11-02 LAB — CBC
HCT: 31.5 % — ABNORMAL LOW (ref 36.0–46.0)
Hemoglobin: 10.4 g/dL — ABNORMAL LOW (ref 12.0–15.0)
MCHC: 33 g/dL (ref 30.0–36.0)
MCV: 75.6 fl — ABNORMAL LOW (ref 78.0–100.0)
Platelets: 211 10*3/uL (ref 150.0–400.0)
RBC: 4.17 Mil/uL (ref 3.87–5.11)
RDW: 17.4 % — ABNORMAL HIGH (ref 11.5–15.5)
WBC: 10.3 10*3/uL (ref 4.0–10.5)

## 2017-11-02 LAB — TSH: TSH: 1.15 u[IU]/mL (ref 0.35–4.50)

## 2017-11-02 MED ORDER — OLOPATADINE HCL 0.7 % OP SOLN: 1.0000 [drp] | Freq: Every day | OPHTHALMIC | 2 refills | Status: DC

## 2017-11-02 NOTE — Progress Notes (Signed)
ACUTE VISIT   HPI:  Chief Complaint  Patient presents with  . Fatigue  . Eyes Irritated    Brittany Roberts is a 77 y.o. female, who is here today complaining of worsening fatigue for the past month "or more." She has had fatigue/feeling tired for years. Alleviated by taking a nap in the early afternoon. She does not feel rested when she gets up in the morning.  Hx of CHF, Echo  In 04/2017 LVEF 35-40%. On Furosemide 40 mg daily. OSA, she is not on CPAP, she uses nocturnal O2.  CAD and grade I diastolic dysfunction.  Past Hx of hypothyroidism,she is not longer on hormonal replacement.  Lab Results  Component Value Date   TSH 0.63 04/08/2017   Lab Results  Component Value Date   WBC 7.3 09/06/2017   HGB 11.1 (L) 09/06/2017   HCT 34.2 (L) 09/06/2017   MCV 74.4 (L) 09/06/2017   PLT 255.0 09/06/2017   She has trouble falling asleep, goes to bed around 10 pm and wakes up at 4 Am, not able to go back to sleep. Gets up 2-3 times to urinate.  CKD III, no gross hematuria,foam in urine,or decreased urine output.   Lab Results  Component Value Date   CREATININE 1.53 (H) 09/06/2017   BUN 28 (H) 09/06/2017   NA 139 09/06/2017   K 4.2 09/06/2017   CL 109 09/06/2017   CO2 21 09/06/2017   "Irritated eyes" also for a month. Pruritus,tearing bilateral. No drainage or conjunctival erythema.  Nasal congestion.  Sun light seems to make it worse.  Reporting Hx of dry eye.  Asthma symptoms well controlled, no cough or wheezing.    Review of Systems  Constitutional: Positive for fatigue. Negative for activity change, appetite change and fever.  HENT: Positive for congestion, postnasal drip and rhinorrhea. Negative for mouth sores, nosebleeds and trouble swallowing.   Eyes: Positive for photophobia, discharge and itching. Negative for redness and visual disturbance.  Respiratory: Negative for cough, shortness of breath and wheezing.   Cardiovascular: Negative for  chest pain, palpitations and leg swelling.  Gastrointestinal: Negative for abdominal pain, nausea and vomiting.       Negative for changes in bowel habits.  Genitourinary: Negative for decreased urine volume, dysuria and hematuria.  Musculoskeletal: Positive for gait problem.  Allergic/Immunologic: Positive for environmental allergies.  Neurological: Negative for syncope, weakness and headaches.  Psychiatric/Behavioral: Positive for sleep disturbance. Negative for confusion. The patient is nervous/anxious.       Current Outpatient Medications on File Prior to Visit  Medication Sig Dispense Refill  . ACCU-CHEK AVIVA PLUS test strip TEST three times a day 300 each 3  . acetaminophen (TYLENOL) 325 MG tablet Take 650 mg by mouth every 6 (six) hours as needed for mild pain or moderate pain.    Marland Kitchen albuterol (PROAIR HFA) 108 (90 Base) MCG/ACT inhaler inhale 2 puffs by mouth every 4 hours if needed USE ONLY IF YOU ARE WHEEZING 3 Inhaler 3  . allopurinol (ZYLOPRIM) 100 MG tablet Take 1 tablet (100 mg total) by mouth daily. 90 tablet 3  . amLODipine (NORVASC) 10 MG tablet Take 1 tablet (10 mg total) by mouth daily. 90 tablet 2  . Ascorbic Acid (VITAMIN C PO) Take 1 tablet by mouth daily.    Marland Kitchen aspirin EC 81 MG tablet Take 162 mg by mouth daily.     Marland Kitchen atorvastatin (LIPITOR) 20 MG tablet Take 1 tablet (20 mg total) by  mouth daily. 90 tablet 2  . B Complex Vitamins (VITAMIN B COMPLEX) TABS Take 1 tablet by mouth daily.    . Cholecalciferol (VITAMIN D3) 5000 UNITS CAPS Take 5,000 Units by mouth daily.    . colchicine 0.6 MG tablet 1 tab bid for 1 week then prn 2 tabs once when acute gout onset (no more than 2 tabs per day). 45 tablet 0  . Flaxseed, Linseed, (FLAXSEED OIL PO) Take 1 capsule by mouth daily.    . furosemide (LASIX) 40 MG tablet Take 1 tablet (40 mg total) by mouth daily. (hold this for 2 days, then resume.  Please follow up with PCP for repeat labs and long term plan) 30 tablet 0  .  gabapentin (NEURONTIN) 300 MG capsule Take 1 capsule (300 mg total) by mouth 2 (two) times daily. 180 capsule 3  . guaiFENesin (ROBITUSSIN) 100 MG/5ML liquid Take 200 mg by mouth 3 (three) times daily as needed for cough.    . Insulin NPH, Human,, Isophane, (HUMULIN N KWIKPEN) 100 UNIT/ML Kiwkpen INJECT 30 UNITS SUBCUTANEOUSLY EVERY MORNING 30 mL 3  . Insulin Pen Needle (PEN NEEDLES 31GX5/16") 31G X 8 MM MISC Check Blood sugar three times per day. Dx insulin using E11.8 100 each 3  . levETIRAcetam (KEPPRA) 500 MG tablet Take 1 tablet (500 mg total) by mouth 2 (two) times daily. 180 tablet 3  . metoprolol tartrate (LOPRESSOR) 50 MG tablet TAKE 1 TABLET BY MOUTH ONCE DAILY WITH OR IMMEDIATELY FOLLOWING A MEAL FOR HYPERTENSION 30 tablet 0  . montelukast (SINGULAIR) 10 MG tablet Take 1 tablet (10 mg total) by mouth at bedtime. 30 tablet 0  . Multiple Vitamin (MULTIVITAMIN WITH MINERALS) TABS tablet Take 1 tablet by mouth daily.    . nitroGLYCERIN (NITROSTAT) 0.4 MG SL tablet Place 1 tablet (0.4 mg total) under the tongue every 5 (five) minutes as needed for chest pain. 20 tablet 12  . pantoprazole (PROTONIX) 40 MG tablet Take 1 tablet (40 mg total) by mouth daily. 90 tablet 3  . Polyethyl Glycol-Propyl Glycol (SYSTANE ULTRA OP) Place 1 drop into both eyes every 6 (six) hours as needed (dry eyes).     No current facility-administered medications on file prior to visit.      Past Medical History:  Diagnosis Date  . Acute delirium 01/14/2017  . Anxiety   . Arthritis    "all over"  . Asthma   . Bipolar disorder ([place])   . Complication of anesthesia    "w/right foot OR they gave me too much and they couldn't get me woke"  . Congestive heart failure, unspecified   . Coronary artery calcification seen on CAT scan 01/14/2017  . Coronary artery disease    "I've got 1 stent" (06/30/2015)  . Depression   . Discoid lupus   . Fibromyalgia    "I've been told I have this; can't take Lyrica cause I'm  allergic to it" (06/30/2015)  . GERD (gastroesophageal reflux disease)   . History of gout   . History of hiatal hernia    "real bad" (06/30/2015)  . Hypertension   . Hypothyroidism   . On home oxygen therapy    "2L q hs" (06/30/2015)  . OSA (obstructive sleep apnea)    "just use my oxygen; no mask" (06/30/2015)  . Other and unspecified hyperlipidemia   . Penetrating foot wound    left nonhealing foot wound on the dorsal surface  . Peripheral neuropathy   . Pneumonia "many of  times"  . Refusal of blood transfusions as patient is Jehovah's Witness   . Renal failure, unspecified    "kidneys not working 100%" (06/30/2015)  . Sickle cell trait ([place])   . Systemic lupus ([place])   . Thoracic aortic atherosclerosis ([place]) 01/14/2017  . Type II diabetes mellitus ([place])   . Unspecified vitamin D deficiency    Allergies  Allergen Reactions  . Ace Inhibitors Other (See Comments)    Coincided with sig bump in creat. Retried and creat bumped again.  Beulah Gandy [Isosorb Dinitrate-Hydralazine] Other (See Comments)    Sleep all the time.  Marland Kitchen Penicillins Anaphylaxis, Swelling and Rash    Has patient had a PCN reaction causing immediate rash, facial/tongue/throat swelling, SOB or lightheadedness with hypotension: Yes Has patient had a PCN reaction causing severe rash involving mucus membranes or skin necrosis: Yes Has patient had a PCN reaction that required hospitalization: Yes Has patient had a PCN reaction occurring within the last 10 years: No If all of the above answers are "NO", then may proceed with Cephalosporin use.   Ebbie Ridge [Buspirone] Other (See Comments)    pain  . Pregabalin Swelling  . Ropinirole Hydrochloride Swelling  . Amantadines Rash    "Swelling of the tongue"    Social History   Socioeconomic History  . Marital status: Single    Spouse name: Not on file  . Number of children: 4  . Years of education: 52 th  . Highest education level: Not on file  Occupational History    . Occupation: Retired- Engineer, production    Employer: RETIRED  Social Needs  . Financial resource strain: Not on file  . Food insecurity:    Worry: Not on file    Inability: Not on file  . Transportation needs:    Medical: Not on file    Non-medical: Not on file  Tobacco Use  . Smoking status: Former Smoker    Packs/day: 3.00    Years: 40.00    Pack years: 120.00    Types: Cigarettes    Start date: 07/05/1960    Last attempt to quit: 07/05/1998    Years since quitting: 19.3  . Smokeless tobacco: Never Used  Substance and Sexual Activity  . Alcohol use: No    Alcohol/week: 0.0 oz    Comment: 06/30/2015 "used to drink a little alcohol on the weekends; not q weekend; nothing in years"  . Drug use: No  . Sexual activity: Never  Lifestyle  . Physical activity:    Days per week: Not on file    Minutes per session: Not on file  . Stress: Not on file  Relationships  . Social connections:    Talks on phone: Not on file    Gets together: Not on file    Attends religious service: Not on file    Active member of club or organization: Not on file    Attends meetings of clubs or organizations: Not on file    Relationship status: Not on file  Other Topics Concern  . Not on file  Social History Narrative   Health Care POA:    Emergency Contact: daughter, Lorenso Courier [phone] or [name] [phone]   End of Life Plan: Does not want to be ventilated or feeding tubes.    Who lives with you: Lives alone   Any pets: none   Diet: Patient reports enjoying and eating junk food.  Does not regulate types of food and currently her dentures are  broken so it is hard to eat some foods.   Exercise: Patient does not have an exercise plan.   Seatbelts: Patient reports wearing seatbelt when in vehicle.   Sun Exposure/Protection: Patient reports not going outside very often.   Hobbies: Patient enjoys reading the bible and watching game shows.    Right-handed.   1 cup caffeine per day.    Former smoker-stopped 2000   Alcohol none    Vitals:   11/02/17 0950  BP: 130/82  Pulse: 76  Resp: 12  Temp: 98.4 F (36.9 C)  SpO2: 98%   Body mass index is 47.86 kg/m.   Physical Exam  Nursing note and vitals reviewed. Constitutional: She is oriented to person, place, and time. She appears well-developed. No distress.  HENT:  Head: Normocephalic and atraumatic.  Mouth/Throat: Oropharynx is clear and moist. Mucous membranes are dry.  Edentulous.  Eyes: Pupils are equal, round, and reactive to light. Conjunctivae and EOM are normal.  Epiphora,bilateral.  Cardiovascular: Normal rate and regular rhythm.  No murmur heard. Pulses:      Dorsalis pedis pulses are 2+ on the right side, and 2+ on the left side.  Respiratory: Effort normal and breath sounds normal. No respiratory distress.  GI: Soft. She exhibits no mass. There is no tenderness.  Musculoskeletal: She exhibits no edema.  Lymphadenopathy:    She has no cervical adenopathy.  Neurological: She is alert and oriented to person, place, and time. She has normal strength.  Unstable gait assisted with a walker.  Skin: Skin is warm. No erythema.  Psychiatric: She has a normal mood and affect.  Well groomed, good eye contact.    ASSESSMENT AND PLAN:   Ms. Ana was seen today for fatigue and eyes irritated.  Diagnoses and all orders for this visit:  Lab Results  Component Value Date   TSH 1.15 11/02/2017   Lab Results  Component Value Date   CREATININE 1.51 (H) 11/02/2017   BUN 24 (H) 11/02/2017   NA 143 11/02/2017   K 3.4 (L) 11/02/2017   CL 104 11/02/2017   CO2 28 11/02/2017   Lab Results  Component Value Date   WBC 10.3 11/02/2017   HGB 10.4 (L) 11/02/2017   HCT 31.5 (L) 11/02/2017   MCV 75.6 (L) 11/02/2017   PLT 211.0 11/02/2017    Fatigue, unspecified type   We discussed possible etiologies: Systemic illness, immunologic,endocrinology,sleep disorder, psychiatric/psychologic,  infectious,medications side effects, and idiopathic. A few of her medical problems and medications can aggravate problem.  Healthy diet and regular physical activity as tolerated may help. She can also take a nap early afternoon. Further recommendations will be given according to lab results.  -     Basic metabolic panel -     TSH -     CBC  Allergic conjunctivitis of both eyes  Other differential Dx's discussed. Pazeo daily. Follow with eye care provider if not improvement.  -     Olopatadine HCl (PAZEO) 0.7 % SOLN; Apply 1 drop to eye daily.   Insomnia, unspecified type  Good sleep hygiene. Avoid fluids 3-4 hours before bedtime. Melatonin 3 mg at bedtime. We may consider low dose Trazodone or Doxepin.      [name]. [name], MD  [facility]. [place] office.

## 2017-11-02 NOTE — Patient Instructions (Addendum)
A few things to remember from today's visit:   Fatigue, unspecified type - Plan: Basic metabolic panel, TSH, CBC  Allergic conjunctivitis of both eyes - Plan: Olopatadine HCl (PAZEO) 0.7 % SOLN  Several of your medical problems can cause fatigue/tireness. Take a nap, decrease fluid intake 3-4 hours before bedtime. Melatonin 3 mg at bedtime may help.   Please be sure medication list is accurate. If a new problem present, please set up appointment sooner than planned today.          Carbohydrate Counting for Diabetes Mellitus, Adult Carbohydrate counting is a method for keeping track of how many carbohydrates you eat. Eating carbohydrates naturally increases the amount of sugar (glucose) in the blood. Counting how many carbohydrates you eat helps keep your blood glucose within normal limits, which helps you manage your diabetes (diabetes mellitus). It is important to know how many carbohydrates you can safely have in each meal. This is different for every person. A diet and nutrition specialist (registered dietitian) can help you make a meal plan and calculate how many carbohydrates you should have at each meal and snack. Carbohydrates are found in the following foods:  Grains, such as breads and cereals.  Dried beans and soy products.  Starchy vegetables, such as potatoes, peas, and corn.  Fruit and fruit juices.  Milk and yogurt.  Sweets and snack foods, such as cake, cookies, candy, chips, and soft drinks.  How do I count carbohydrates? There are two ways to count carbohydrates in food. You can use either of the methods or a combination of both. Reading "Nutrition Facts" on packaged food The "Nutrition Facts" list is included on the labels of almost all packaged foods and beverages in the [place] It includes:  The serving size.  Information about nutrients in each serving, including the grams (g) of carbohydrate per serving.  To use the "Nutrition Facts":  Decide how  many servings you will have.  Multiply the number of servings by the number of carbohydrates per serving.  The resulting number is the total amount of carbohydrates that you will be having.  Learning standard serving sizes of other foods When you eat foods containing carbohydrates that are not packaged or do not include "Nutrition Facts" on the label, you need to measure the servings in order to count the amount of carbohydrates:  Measure the foods that you will eat with a food scale or measuring cup, if needed.  Decide how many standard-size servings you will eat.  Multiply the number of servings by 15. Most carbohydrate-rich foods have about 15 g of carbohydrates per serving. ? For example, if you eat 8 oz (170 g) of strawberries, you will have eaten 2 servings and 30 g of carbohydrates (2 servings x 15 g = 30 g).  For foods that have more than one food mixed, such as soups and casseroles, you must count the carbohydrates in each food that is included.  The following list contains standard serving sizes of common carbohydrate-rich foods. Each of these servings has about 15 g of carbohydrates:   hamburger bun or  English muffin.   oz (15 mL) syrup.   oz (14 g) jelly.  1 slice of bread.  1 six-inch tortilla.  3 oz (85 g) cooked rice or pasta.  4 oz (113 g) cooked dried beans.  4 oz (113 g) starchy vegetable, such as peas, corn, or potatoes.  4 oz (113 g) hot cereal.  4 oz (113 g) mashed potatoes or  of a large baked potato.  4 oz (113 g) canned or frozen fruit.  4 oz (120 mL) fruit juice.  4-6 crackers.  6 chicken nuggets.  6 oz (170 g) unsweetened dry cereal.  6 oz (170 g) plain fat-free yogurt or yogurt sweetened with artificial sweeteners.  8 oz (240 mL) milk.  8 oz (170 g) fresh fruit or one small piece of fruit.  24 oz (680 g) popped popcorn.  Example of carbohydrate counting Sample meal  3 oz (85 g) chicken breast.  6 oz (170 g) brown  rice.  4 oz (113 g) corn.  8 oz (240 mL) milk.  8 oz (170 g) strawberries with sugar-free whipped topping. Carbohydrate calculation 1. Identify the foods that contain carbohydrates: ? Rice. ? Corn. ? Milk. ? Strawberries. 2. Calculate how many servings you have of each food: ? 2 servings rice. ? 1 serving corn. ? 1 serving milk. ? 1 serving strawberries. 3. Multiply each number of servings by 15 g: ? 2 servings rice x 15 g = 30 g. ? 1 serving corn x 15 g = 15 g. ? 1 serving milk x 15 g = 15 g. ? 1 serving strawberries x 15 g = 15 g. 4. Add together all of the amounts to find the total grams of carbohydrates eaten: ? 30 g + 15 g + 15 g + 15 g = 75 g of carbohydrates total. This information is not intended to replace advice given to you by your health care provider. Make sure you discuss any questions you have with your health care provider. Document Released: 06/21/2005 Document Revised: 01/09/2016 Document Reviewed: 12/03/2015 Elsevier Interactive Patient Education  [employer].

## 2017-11-10 ENCOUNTER — Telehealth: Payer: Self-pay | Admitting: Neurology

## 2017-11-10 NOTE — Telephone Encounter (Signed)
Noted  

## 2017-11-10 NOTE — Telephone Encounter (Signed)
Patient called and said that the [facility] will be sending a letter to Dr. Delice Lesch regarding her License. She said once Dr. Delice Lesch receives it could she fill out and fax to [phone]. She said they need to speak with her Prior to faxing the letter. Thanks

## 2017-11-11 DIAGNOSIS — J45909 Unspecified asthma, uncomplicated: Secondary | ICD-10-CM | POA: Diagnosis not present

## 2017-11-14 ENCOUNTER — Other Ambulatory Visit: Payer: Self-pay | Admitting: Family Medicine

## 2017-11-15 ENCOUNTER — Other Ambulatory Visit: Payer: Self-pay | Admitting: Family Medicine

## 2017-11-15 ENCOUNTER — Other Ambulatory Visit: Payer: Self-pay | Admitting: *Deleted

## 2017-11-15 ENCOUNTER — Telehealth: Payer: Self-pay | Admitting: Family Medicine

## 2017-11-15 DIAGNOSIS — E1149 Type 2 diabetes mellitus with other diabetic neurological complication: Secondary | ICD-10-CM

## 2017-11-15 MED ORDER — ACCU-CHEK AVIVA DEVI: 1 refills | Status: AC

## 2017-11-15 NOTE — Telephone Encounter (Signed)
Rx sent to pharmacy as requested.

## 2017-11-15 NOTE — Telephone Encounter (Signed)
[facility] requesting rx for [employer]   LOV:11/02/17 Dr. [name]  [facility]   [address] [place]

## 2017-11-15 NOTE — Telephone Encounter (Signed)
Copied from [place] [id number]. Topic: Quick Communication - Rx Refill/Question >> Nov 15, 2017 12:37 PM Bea Graff, NT wrote: Medication: Accu-Check Aviva machine Has the patient contacted their pharmacy? Yes.   (Agent: If no, request that the patient contact the pharmacy for the refill.) Preferred Pharmacy (with phone number or street name): [phone] - [place], [place] - [address] [phone] (Phone) [phone] (Fax)     Agent: Please be advised that RX refills may take up to 3 business days. We ask that you follow-up with your pharmacy.

## 2017-11-20 ENCOUNTER — Other Ambulatory Visit: Payer: Self-pay | Admitting: Family Medicine

## 2017-11-20 DIAGNOSIS — E1149 Type 2 diabetes mellitus with other diabetic neurological complication: Secondary | ICD-10-CM

## 2017-11-21 ENCOUNTER — Telehealth: Payer: Self-pay | Admitting: Family Medicine

## 2017-11-21 NOTE — Telephone Encounter (Signed)
Copied from [place] [id number]. Topic: Quick Communication - Rx Refill/Question >> Nov 21, 2017 11:47 AM Cleaster Corin, NT wrote: Medication: albuterol [facility] HFA) 108 (90 Base) MCG/ACT inhaler [[id number]]   Has the patient contacted their pharmacy? yes (Agent: If no, request that the patient contact the pharmacy for the refill.) (Agent: If yes, when and what did the pharmacy advise?)  Preferred Pharmacy (with phone number or street name):[facility] [place] [place] - [address] [place] [address] Phone: [phone] Fax: [phone]     Agent: Please be advised that RX refills may take up to 3 business days. We ask that you follow-up with your pharmacy.

## 2017-11-22 MED ORDER — ALBUTEROL SULFATE HFA 108 (90 BASE) MCG/ACT IN AERS: INHALATION_SPRAY | RESPIRATORY_TRACT | 0 refills | Status: DC

## 2017-11-29 ENCOUNTER — Telehealth: Payer: Self-pay | Admitting: Neurology

## 2017-11-29 NOTE — Telephone Encounter (Signed)
Patient lmom needing Dr. Delice Lesch to fax a letter to the [facility] [phone] concerning her getting her License back as well as any other medical concerns and evaluation and date of when she had her Seizure. Thanks

## 2017-11-30 DIAGNOSIS — J45909 Unspecified asthma, uncomplicated: Secondary | ICD-10-CM | POA: Diagnosis not present

## 2017-12-07 ENCOUNTER — Telehealth: Payer: Self-pay | Admitting: Family Medicine

## 2017-12-07 NOTE — Telephone Encounter (Signed)
Spoke with Brittany Roberts and she stated that the she wanted to know if patient could get an appointment to discuss some of the issues she was told by the aid. She stated that [name] is showing signs of dementia and that she is stating that something is crawling up legs, but there isn't anything on her. She also stated that according to the aid, [name] and her daughter have been arguing all the time and that the daughter told the aid that [name] takes her medication throughout the day so she believe she is overdosing on it. Brittany Roberts stated she would talk to [name] on 12/08/17 and she if she can talk her into coming in to be seen.

## 2017-12-07 NOTE — Telephone Encounter (Signed)
Listed on past medical history of schizophrenia and bipolar disorder.  Recently she was evaluated by psychiatrist, who determined she did not have any psychotic disorder. We will discuss problem during office visit.  Betty [name], MD

## 2017-12-07 NOTE — Telephone Encounter (Signed)
Copied from [place] [id number]. Topic: Inquiry >> Dec 07, 2017  2:03 PM Scherrie Gerlach wrote: Reason for CRM: [place] nurse with [facility] calling to ask a few questions about the pt. Their aid noticed pt is having hallucinations, memory.  They do not see amy history of these issues The daughter told the aid that the pt took an overdose of pills about a few weeks ago, so she was trying to make sure all pills are kept away from the pt. If pt doesn't have these issues, them pr  Office [phone]  (9 am and 4 PM) Cell:  [phone]

## 2017-12-09 NOTE — Telephone Encounter (Signed)
Copied from [place] [id number]. Topic: Inquiry >> Dec 08, 2017  3:58 PM Oneta Rack wrote: Osvaldo Human name: Brittany Roberts  Relation to pt: RN from [facility] Call back number: [phone]   Reason for call:  Wanted to inform PCP patient status; BP reading 133/81 and pulse 68. Nurse observed patient feeling fatigue and stated AID informed RN patient has been hallucinating and accusing ppl of taking "stuff" from her and memory loss.  RN assessed patient for 15 minutes today and noticed her momory loss was fine, patient aware of today date and month, patient was able to advise who her former aid was and why she is no longer her aid. Nurse advised to follow up with PCP to discuss her being fatigue, patient states she will call on Monday 12/12/17 to schedule an appointment, due to transportation needing to be aware 3 days prior to appointment

## 2017-12-12 DIAGNOSIS — J45909 Unspecified asthma, uncomplicated: Secondary | ICD-10-CM | POA: Diagnosis not present

## 2017-12-13 ENCOUNTER — Emergency Department (HOSPITAL_COMMUNITY): Payer: Medicare Other

## 2017-12-13 ENCOUNTER — Other Ambulatory Visit: Payer: Self-pay

## 2017-12-13 ENCOUNTER — Encounter (HOSPITAL_COMMUNITY): Payer: Self-pay | Admitting: Emergency Medicine

## 2017-12-13 ENCOUNTER — Inpatient Hospital Stay (HOSPITAL_COMMUNITY)
Admission: EM | Admit: 2017-12-13 | Discharge: 2017-12-15 | DRG: 291 | Disposition: A | Discharge status: Home / Self Care | Payer: Medicare Other | Attending: Cardiology | Admitting: Cardiology

## 2017-12-13 ENCOUNTER — Ambulatory Visit: Payer: Self-pay | Admitting: Family Medicine

## 2017-12-13 DIAGNOSIS — Z841 Family history of disorders of kidney and ureter: Secondary | ICD-10-CM

## 2017-12-13 DIAGNOSIS — I700 Atherosclerosis of aorta: Secondary | ICD-10-CM | POA: Diagnosis present

## 2017-12-13 DIAGNOSIS — R0602 Shortness of breath: Secondary | ICD-10-CM | POA: Diagnosis not present

## 2017-12-13 DIAGNOSIS — R079 Chest pain, unspecified: Secondary | ICD-10-CM

## 2017-12-13 DIAGNOSIS — K449 Diaphragmatic hernia without obstruction or gangrene: Secondary | ICD-10-CM | POA: Diagnosis not present

## 2017-12-13 DIAGNOSIS — N183 Chronic kidney disease, stage 3 (moderate): Secondary | ICD-10-CM | POA: Diagnosis present

## 2017-12-13 DIAGNOSIS — G4733 Obstructive sleep apnea (adult) (pediatric): Secondary | ICD-10-CM | POA: Diagnosis not present

## 2017-12-13 DIAGNOSIS — I129 Hypertensive chronic kidney disease with stage 1 through stage 4 chronic kidney disease, or unspecified chronic kidney disease: Secondary | ICD-10-CM | POA: Diagnosis not present

## 2017-12-13 DIAGNOSIS — Z961 Presence of intraocular lens: Secondary | ICD-10-CM | POA: Diagnosis present

## 2017-12-13 DIAGNOSIS — Z8261 Family history of arthritis: Secondary | ICD-10-CM

## 2017-12-13 DIAGNOSIS — Z8249 Family history of ischemic heart disease and other diseases of the circulatory system: Secondary | ICD-10-CM

## 2017-12-13 DIAGNOSIS — I110 Hypertensive heart disease with heart failure: Secondary | ICD-10-CM | POA: Diagnosis present

## 2017-12-13 DIAGNOSIS — G629 Polyneuropathy, unspecified: Secondary | ICD-10-CM | POA: Diagnosis present

## 2017-12-13 DIAGNOSIS — Z794 Long term (current) use of insulin: Secondary | ICD-10-CM

## 2017-12-13 DIAGNOSIS — Z955 Presence of coronary angioplasty implant and graft: Secondary | ICD-10-CM

## 2017-12-13 DIAGNOSIS — F319 Bipolar disorder, unspecified: Secondary | ICD-10-CM | POA: Diagnosis present

## 2017-12-13 DIAGNOSIS — Z6841 Body Mass Index (BMI) 40.0 and over, adult: Secondary | ICD-10-CM | POA: Diagnosis not present

## 2017-12-13 DIAGNOSIS — J45909 Unspecified asthma, uncomplicated: Secondary | ICD-10-CM | POA: Diagnosis present

## 2017-12-13 DIAGNOSIS — Z7982 Long term (current) use of aspirin: Secondary | ICD-10-CM

## 2017-12-13 DIAGNOSIS — M109 Gout, unspecified: Secondary | ICD-10-CM | POA: Diagnosis not present

## 2017-12-13 DIAGNOSIS — I25119 Atherosclerotic heart disease of native coronary artery with unspecified angina pectoris: Secondary | ICD-10-CM | POA: Diagnosis present

## 2017-12-13 DIAGNOSIS — Z9841 Cataract extraction status, right eye: Secondary | ICD-10-CM

## 2017-12-13 DIAGNOSIS — E039 Hypothyroidism, unspecified: Secondary | ICD-10-CM | POA: Diagnosis not present

## 2017-12-13 DIAGNOSIS — M797 Fibromyalgia: Secondary | ICD-10-CM | POA: Diagnosis not present

## 2017-12-13 DIAGNOSIS — I130 Hypertensive heart and chronic kidney disease with heart failure and stage 1 through stage 4 chronic kidney disease, or unspecified chronic kidney disease: Principal | ICD-10-CM | POA: Diagnosis present

## 2017-12-13 DIAGNOSIS — Z825 Family history of asthma and other chronic lower respiratory diseases: Secondary | ICD-10-CM

## 2017-12-13 DIAGNOSIS — E785 Hyperlipidemia, unspecified: Secondary | ICD-10-CM | POA: Diagnosis not present

## 2017-12-13 DIAGNOSIS — R0789 Other chest pain: Secondary | ICD-10-CM | POA: Diagnosis not present

## 2017-12-13 DIAGNOSIS — Z9049 Acquired absence of other specified parts of digestive tract: Secondary | ICD-10-CM

## 2017-12-13 DIAGNOSIS — M1990 Unspecified osteoarthritis, unspecified site: Secondary | ICD-10-CM | POA: Diagnosis present

## 2017-12-13 DIAGNOSIS — I428 Other cardiomyopathies: Secondary | ICD-10-CM | POA: Diagnosis present

## 2017-12-13 DIAGNOSIS — K219 Gastro-esophageal reflux disease without esophagitis: Secondary | ICD-10-CM | POA: Diagnosis not present

## 2017-12-13 DIAGNOSIS — Z832 Family history of diseases of the blood and blood-forming organs and certain disorders involving the immune mechanism: Secondary | ICD-10-CM

## 2017-12-13 DIAGNOSIS — E559 Vitamin D deficiency, unspecified: Secondary | ICD-10-CM | POA: Diagnosis present

## 2017-12-13 DIAGNOSIS — G40909 Epilepsy, unspecified, not intractable, without status epilepticus: Secondary | ICD-10-CM | POA: Diagnosis present

## 2017-12-13 DIAGNOSIS — I340 Nonrheumatic mitral (valve) insufficiency: Secondary | ICD-10-CM | POA: Diagnosis present

## 2017-12-13 DIAGNOSIS — Z9981 Dependence on supplemental oxygen: Secondary | ICD-10-CM | POA: Diagnosis not present

## 2017-12-13 DIAGNOSIS — Z9842 Cataract extraction status, left eye: Secondary | ICD-10-CM

## 2017-12-13 DIAGNOSIS — R05 Cough: Secondary | ICD-10-CM | POA: Diagnosis not present

## 2017-12-13 DIAGNOSIS — I5043 Acute on chronic combined systolic (congestive) and diastolic (congestive) heart failure: Secondary | ICD-10-CM | POA: Diagnosis present

## 2017-12-13 DIAGNOSIS — I5033 Acute on chronic diastolic (congestive) heart failure: Secondary | ICD-10-CM | POA: Diagnosis not present

## 2017-12-13 DIAGNOSIS — Z833 Family history of diabetes mellitus: Secondary | ICD-10-CM

## 2017-12-13 DIAGNOSIS — M329 Systemic lupus erythematosus, unspecified: Secondary | ICD-10-CM | POA: Diagnosis not present

## 2017-12-13 DIAGNOSIS — I25110 Atherosclerotic heart disease of native coronary artery with unstable angina pectoris: Secondary | ICD-10-CM | POA: Diagnosis not present

## 2017-12-13 DIAGNOSIS — Z809 Family history of malignant neoplasm, unspecified: Secondary | ICD-10-CM

## 2017-12-13 DIAGNOSIS — J449 Chronic obstructive pulmonary disease, unspecified: Secondary | ICD-10-CM | POA: Diagnosis present

## 2017-12-13 DIAGNOSIS — I509 Heart failure, unspecified: Secondary | ICD-10-CM

## 2017-12-13 DIAGNOSIS — E119 Type 2 diabetes mellitus without complications: Secondary | ICD-10-CM | POA: Diagnosis not present

## 2017-12-13 DIAGNOSIS — Z880 Allergy status to penicillin: Secondary | ICD-10-CM

## 2017-12-13 DIAGNOSIS — R0902 Hypoxemia: Secondary | ICD-10-CM | POA: Diagnosis not present

## 2017-12-13 DIAGNOSIS — D573 Sickle-cell trait: Secondary | ICD-10-CM | POA: Diagnosis present

## 2017-12-13 DIAGNOSIS — I440 Atrioventricular block, first degree: Secondary | ICD-10-CM | POA: Diagnosis present

## 2017-12-13 DIAGNOSIS — J309 Allergic rhinitis, unspecified: Secondary | ICD-10-CM | POA: Diagnosis present

## 2017-12-13 DIAGNOSIS — Z87891 Personal history of nicotine dependence: Secondary | ICD-10-CM

## 2017-12-13 DIAGNOSIS — I10 Essential (primary) hypertension: Secondary | ICD-10-CM | POA: Diagnosis not present

## 2017-12-13 DIAGNOSIS — Z888 Allergy status to other drugs, medicaments and biological substances status: Secondary | ICD-10-CM

## 2017-12-13 LAB — CREATININE, SERUM
Creatinine, Ser: 1.55 mg/dL — ABNORMAL HIGH (ref 0.44–1.00)
GFR calc Af Amer: 36 mL/min — ABNORMAL LOW (ref >60)
GFR calc non Af Amer: 31 mL/min — ABNORMAL LOW (ref >60)

## 2017-12-13 LAB — TROPONIN I: Troponin I: <0.03 ng/mL (ref <0.03)

## 2017-12-13 LAB — GLUCOSE, CAPILLARY
Glucose-Capillary: 151 mg/dL — ABNORMAL HIGH (ref 65–99)
Glucose-Capillary: 131 mg/dL — ABNORMAL HIGH (ref 65–99)

## 2017-12-13 LAB — CBC
HCT: 31.4 % — ABNORMAL LOW (ref 36.0–46.0)
HCT: 31.6 % — ABNORMAL LOW (ref 36.0–46.0)
Hemoglobin: 10.3 g/dL — ABNORMAL LOW (ref 12.0–15.0)
Hemoglobin: 10.3 g/dL — ABNORMAL LOW (ref 12.0–15.0)
MCH: 24.1 pg — ABNORMAL LOW (ref 26.0–34.0)
MCH: 24.1 pg — ABNORMAL LOW (ref 26.0–34.0)
MCHC: 32.8 g/dL (ref 30.0–36.0)
MCHC: 32.6 g/dL (ref 30.0–36.0)
MCV: 73.5 fL — ABNORMAL LOW (ref 78.0–100.0)
MCV: 74 fL — ABNORMAL LOW (ref 78.0–100.0)
Platelets: 231 10*3/uL (ref 150–400)
Platelets: 286 10*3/uL (ref 150–400)
RBC: 4.27 MIL/uL (ref 3.87–5.11)
RBC: 4.27 MIL/uL (ref 3.87–5.11)
RDW: 15.4 % (ref 11.5–15.5)
RDW: 15.5 % (ref 11.5–15.5)
WBC: 9.3 10*3/uL (ref 4.0–10.5)
WBC: 10.1 10*3/uL (ref 4.0–10.5)

## 2017-12-13 LAB — BRAIN NATRIURETIC PEPTIDE: B Natriuretic Peptide: 393.4 pg/mL — ABNORMAL HIGH (ref 0.0–100.0)

## 2017-12-13 LAB — BASIC METABOLIC PANEL
Anion gap: 8 (ref 5–15)
BUN: 14 mg/dL (ref 6–20)
CO2: 30 mmol/L (ref 22–32)
Calcium: 8.3 mg/dL — ABNORMAL LOW (ref 8.9–10.3)
Chloride: 107 mmol/L (ref 101–111)
Creatinine, Ser: 1.44 mg/dL — ABNORMAL HIGH (ref 0.44–1.00)
GFR calc Af Amer: 39 mL/min — ABNORMAL LOW (ref >60)
GFR calc non Af Amer: 34 mL/min — ABNORMAL LOW (ref >60)
Glucose, Bld: 162 mg/dL — ABNORMAL HIGH (ref 65–99)
Potassium: 3.7 mmol/L (ref 3.5–5.1)
Sodium: 145 mmol/L (ref 135–145)

## 2017-12-13 LAB — I-STAT TROPONIN, ED: Troponin i, poc: 0.01 ng/mL (ref 0.00–0.08)

## 2017-12-13 MED ORDER — METOPROLOL SUCCINATE ER 25 MG PO TB24
25.0000 mg | ORAL_TABLET | Freq: Every day | ORAL | Status: DC
  Administered 2017-12-13 – 2017-12-15 (×3): 25 mg via ORAL
  Filled 2017-12-13 (×3): qty 1

## 2017-12-13 MED ORDER — ACETAMINOPHEN 325 MG PO TABS: 650.0000 mg | ORAL_TABLET | Freq: Four times a day (QID) | ORAL | Status: DC | PRN

## 2017-12-13 MED ORDER — ADULT MULTIVITAMIN W/MINERALS CH
1.0000 | ORAL_TABLET | Freq: Every day | ORAL | Status: DC
  Administered 2017-12-14 – 2017-12-15 (×2): 1 via ORAL
  Filled 2017-12-13 (×3): qty 1

## 2017-12-13 MED ORDER — NITROGLYCERIN 0.4 MG SL SUBL: 0.4000 mg | SUBLINGUAL_TABLET | SUBLINGUAL | Status: DC | PRN

## 2017-12-13 MED ORDER — AMLODIPINE BESYLATE 5 MG PO TABS
10.0000 mg | ORAL_TABLET | Freq: Every day | ORAL | Status: DC
  Administered 2017-12-13 – 2017-12-15 (×3): 10 mg via ORAL
  Filled 2017-12-13 (×3): qty 2

## 2017-12-13 MED ORDER — FUROSEMIDE 10 MG/ML IJ SOLN
40.0000 mg | Freq: Two times a day (BID) | INTRAMUSCULAR | Status: DC
  Administered 2017-12-13 – 2017-12-14 (×2): 40 mg via INTRAVENOUS
  Filled 2017-12-13 (×2): qty 4

## 2017-12-13 MED ORDER — SODIUM CHLORIDE 0.9% FLUSH
3.0000 mL | Freq: Two times a day (BID) | INTRAVENOUS | Status: DC
  Administered 2017-12-13 – 2017-12-14 (×3): 3 mL via INTRAVENOUS

## 2017-12-13 MED ORDER — SODIUM CHLORIDE 0.9% FLUSH: 3.0000 mL | INTRAVENOUS | Status: DC | PRN

## 2017-12-13 MED ORDER — HEPARIN SODIUM (PORCINE) 5000 UNIT/ML IJ SOLN
5000.0000 [IU] | Freq: Three times a day (TID) | INTRAMUSCULAR | Status: DC
  Administered 2017-12-13 – 2017-12-15 (×5): 5000 [IU] via SUBCUTANEOUS
  Filled 2017-12-13 (×5): qty 1

## 2017-12-13 MED ORDER — INSULIN NPH (HUMAN) (ISOPHANE) 100 UNIT/ML ~~LOC~~ SUSP
30.0000 [IU] | Freq: Every day | SUBCUTANEOUS | Status: DC
Start: 2017-12-14 — End: 2017-12-15
  Administered 2017-12-14 – 2017-12-15 (×2): 30 [IU] via SUBCUTANEOUS
  Filled 2017-12-13: qty 10

## 2017-12-13 MED ORDER — GABAPENTIN 300 MG PO CAPS
300.0000 mg | ORAL_CAPSULE | Freq: Two times a day (BID) | ORAL | Status: DC
  Administered 2017-12-13 – 2017-12-15 (×4): 300 mg via ORAL
  Filled 2017-12-13 (×6): qty 1

## 2017-12-13 MED ORDER — NITROGLYCERIN IN D5W 200-5 MCG/ML-% IV SOLN
0.0000 ug/min | INTRAVENOUS | Status: DC
  Administered 2017-12-13: 5 ug/min via INTRAVENOUS
  Filled 2017-12-13: qty 250

## 2017-12-13 MED ORDER — SODIUM CHLORIDE 0.9 % IV SOLN: 250.0000 mL | INTRAVENOUS | Status: DC | PRN

## 2017-12-13 MED ORDER — PANTOPRAZOLE SODIUM 40 MG PO TBEC
40.0000 mg | DELAYED_RELEASE_TABLET | Freq: Every day | ORAL | Status: DC
  Administered 2017-12-13 – 2017-12-15 (×3): 40 mg via ORAL
  Filled 2017-12-13 (×3): qty 1

## 2017-12-13 MED ORDER — INSULIN ASPART 100 UNIT/ML ~~LOC~~ SOLN
0.0000 [IU] | Freq: Three times a day (TID) | SUBCUTANEOUS | Status: DC
  Administered 2017-12-13 – 2017-12-14 (×2): 4 [IU] via SUBCUTANEOUS
  Administered 2017-12-14: 3 [IU] via SUBCUTANEOUS

## 2017-12-13 MED ORDER — OLOPATADINE HCL 0.1 % OP SOLN
1.0000 [drp] | Freq: Every day | OPHTHALMIC | Status: DC
  Administered 2017-12-14 – 2017-12-15 (×2): 1 [drp] via OPHTHALMIC
  Filled 2017-12-13: qty 5

## 2017-12-13 MED ORDER — MONTELUKAST SODIUM 10 MG PO TABS
10.0000 mg | ORAL_TABLET | Freq: Every day | ORAL | Status: DC
  Administered 2017-12-13 – 2017-12-14 (×2): 10 mg via ORAL
  Filled 2017-12-13 (×2): qty 1

## 2017-12-13 MED ORDER — ATORVASTATIN CALCIUM 20 MG PO TABS
20.0000 mg | ORAL_TABLET | Freq: Every day | ORAL | Status: DC
  Administered 2017-12-13 – 2017-12-14 (×2): 20 mg via ORAL
  Filled 2017-12-13 (×2): qty 1

## 2017-12-13 MED ORDER — ACETAMINOPHEN 325 MG PO TABS
650.0000 mg | ORAL_TABLET | ORAL | Status: DC | PRN
  Administered 2017-12-13 – 2017-12-14 (×2): 650 mg via ORAL
  Filled 2017-12-13 (×3): qty 2

## 2017-12-13 MED ORDER — ALLOPURINOL 100 MG PO TABS: 100.0000 mg | ORAL_TABLET | Freq: Every day | ORAL | Status: DC

## 2017-12-13 MED ORDER — FUROSEMIDE 10 MG/ML IJ SOLN
40.0000 mg | Freq: Once | INTRAMUSCULAR | Status: AC
  Administered 2017-12-13: 40 mg via INTRAVENOUS
  Filled 2017-12-13: qty 4

## 2017-12-13 MED ORDER — NITROPRUSSIDE SODIUM 25 MG/ML IV SOLN: 0.0000 ug/kg/min | Freq: Once | INTRAVENOUS | Status: DC

## 2017-12-13 MED ORDER — ASPIRIN EC 81 MG PO TBEC
162.0000 mg | DELAYED_RELEASE_TABLET | Freq: Every day | ORAL | Status: DC
  Administered 2017-12-14 – 2017-12-15 (×2): 162 mg via ORAL
  Filled 2017-12-13 (×2): qty 2

## 2017-12-13 MED ORDER — LEVETIRACETAM 500 MG PO TABS
500.0000 mg | ORAL_TABLET | Freq: Two times a day (BID) | ORAL | Status: DC
  Administered 2017-12-13 – 2017-12-15 (×4): 500 mg via ORAL
  Filled 2017-12-13 (×7): qty 1

## 2017-12-13 MED ORDER — GUAIFENESIN 100 MG/5ML PO LIQD: 200.0000 mg | Freq: Three times a day (TID) | ORAL | Status: DC | PRN

## 2017-12-13 MED ORDER — ONDANSETRON HCL 4 MG/2ML IJ SOLN: 4.0000 mg | Freq: Four times a day (QID) | INTRAMUSCULAR | Status: DC | PRN

## 2017-12-13 MED ORDER — VITAMIN D 1000 UNITS PO TABS
5000.0000 [IU] | ORAL_TABLET | Freq: Every day | ORAL | Status: DC
  Administered 2017-12-14 – 2017-12-15 (×2): 5000 [IU] via ORAL
  Filled 2017-12-13 (×3): qty 5

## 2017-12-13 NOTE — ED Notes (Signed)
Patient transported to X-ray 

## 2017-12-13 NOTE — Telephone Encounter (Signed)
Monitor for ER arrival 

## 2017-12-13 NOTE — ED Triage Notes (Signed)
Patient arrives from home with [facility] with complaint of chest pressure and shortness of breath x3 days. Patient has prescription for SL nitroglycerin at home but did not take it because she was told it was for pain and she states she did not feel pain but instead pressure. Patient given 324mg  aspirin and 0.4 mg SL NTG PTA, pain changed from 8/10 to 3/10 after NTG. Patient alert, oriented, and in no apparent distress at this time.  EMS vitals BP 150/74 100% on 2L O2 [place] HR 80 RR 24 CBG 172

## 2017-12-13 NOTE — H&P (Addendum)
Brittany Roberts is an 77 y.o. female.    Chief Complaint:   HPI:   77 year old African-American female with history of previous ejection fraction EF 35-40%, nonischemic cardiomyopathy, CAD status post RCA PCI, OSA, hypertension, type II diabetes mellitus, peripheral neuropathy, morbid obesity, CKD 3, lupus, h/o seizures.  Patient presented to [facility] emergency report in today with 3 days of shortness of breath and chest tightness that has been persistent.  She endorses compliance with diet and medications.  She denies any fever, chills, nausea, vomiting, productive cough.  She was given sublingual not restrained by EMS that improved her chest tightness.  Workup in the emergency room room showed elevated BNP, EKG with no ischemia, normal troponin.  Chest x-ray showed interstitial edema and bilateral small pleural effusions.     Past Medical History:  Diagnosis Date  . Acute delirium 01/14/2017  . Anxiety   . Arthritis    "all over"  . Asthma   . Bipolar disorder ([place])   . Complication of anesthesia    "w/right foot OR they gave me too much and they couldn't get me woke"  . Congestive heart failure, unspecified   . Coronary artery calcification seen on CAT scan 01/14/2017  . Coronary artery disease    "I've got 1 stent" (06/30/2015)  . Depression   . Discoid lupus   . Fibromyalgia    "I've been told I have this; can't take Lyrica cause I'm allergic to it" (06/30/2015)  . GERD (gastroesophageal reflux disease)   . History of gout   . History of hiatal hernia    "real bad" (06/30/2015)  . Hypertension   . Hypothyroidism   . On home oxygen therapy    "2L q hs" (06/30/2015)  . OSA (obstructive sleep apnea)    "just use my oxygen; no mask" (06/30/2015)  . Other and unspecified hyperlipidemia   . Penetrating foot wound    left nonhealing foot wound on the dorsal surface  . Peripheral neuropathy   . Pneumonia "many of times"  . Refusal of blood transfusions as patient is Jehovah's  Witness   . Renal failure, unspecified    "kidneys not working 100%" (06/30/2015)  . Sickle cell trait ([place])   . Systemic lupus ([place])   . Thoracic aortic atherosclerosis ([employer]) 01/14/2017  . Type II diabetes mellitus ([place])   . Unspecified vitamin D deficiency     Past Surgical History:  Procedure Laterality Date  . ANKLE ARTHROSCOPY WITH OPEN REDUCTION INTERNAL FIXATION (ORIF) Right 03/2011  . CARDIAC CATHETERIZATION N/A 07/01/2015   Procedure: Left Heart Cath and Coronary Angiography;  Surgeon: Adrian Prows, MD;  Location: [place] [facility];  Service: Cardiovascular;  Laterality: N/A;  . CARDIAC CATHETERIZATION N/A 07/01/2015   Procedure: Intravascular Pressure Wire/FFR Study;  Surgeon: Adrian Prows, MD;  Location: [place] [facility];  Service: Cardiovascular;  Laterality: N/A;  . CARPAL TUNNEL RELEASE Bilateral   . CATARACT EXTRACTION W/ INTRAOCULAR LENS  IMPLANT, BILATERAL Bilateral   . CHOLECYSTECTOMY OPEN    . COLONOSCOPY N/A 12/10/2013   Procedure: COLONOSCOPY;  Surgeon: Inda Castle, MD;  Location: [facility];  Service: Endoscopy;  Laterality: N/A;  . FRACTURE SURGERY    . INCISION AND DRAINAGE ABSCESS Left    foot "under my little toe"  . KNEE LIGAMENT RECONSTRUCTION Right   . LEFT HEART CATHETERIZATION WITH CORONARY ANGIOGRAM N/A 08/08/2012   Procedure: LEFT HEART CATHETERIZATION WITH CORONARY ANGIOGRAM;  Surgeon: [name], MD;  Location: [facility]  CATH LAB;  Service: Cardiovascular;  Laterality: N/A;  . NASAL SINUS SURGERY    . PERCUTANEOUS CORONARY STENT INTERVENTION (PCI-S) N/A 08/21/2012   Procedure: PERCUTANEOUS CORONARY STENT INTERVENTION (PCI-S);  Surgeon: [name], MD;  Location: [facility] CATH LAB;  Service: Cardiovascular;  Laterality: N/A;  . TUBAL LIGATION      Family History  Problem Relation Age of Onset  . Heart disease Mother   . Diabetes Mother   . Clotting disorder Mother   . Pneumonia Father   . Rheum arthritis Father   . Diabetes Sister   . Diabetes  Sister   . Asthma Brother   . Cancer Brother   . Kidney disease Brother   . Lupus Son   . Heart disease Son   . Heart disease Daughter   . Colon cancer Neg Hx    Social History:  reports that she quit smoking about 19 years ago. Her smoking use included cigarettes. She started smoking about 57 years ago. She has a 120.00 pack-year smoking history. She has never used smokeless tobacco. She reports that she does not drink alcohol or use drugs.  Allergies:  Allergies  Allergen Reactions  . Ace Inhibitors Other (See Comments)    Coincided with sig bump in creat. Retried and creat bumped again.  Beulah Gandy [Isosorb Dinitrate-Hydralazine] Other (See Comments)    Sleep all the time.  Marland Kitchen Penicillins Anaphylaxis, Swelling and Rash    Has patient had a PCN reaction causing immediate rash, facial/tongue/throat swelling, SOB or lightheadedness with hypotension: Yes Has patient had a PCN reaction causing severe rash involving mucus membranes or skin necrosis: Yes Has patient had a PCN reaction that required hospitalization: Yes Has patient had a PCN reaction occurring within the last 10 years: No If all of the above answers are "NO", then may proceed with Cephalosporin use.   Ebbie Ridge [Buspirone] Other (See Comments)    pain  . Pregabalin Swelling  . Ropinirole Hydrochloride Swelling  . Amantadines Rash    "Swelling of the tongue"    Medications Prior to Admission  Medication Sig Dispense Refill  . acetaminophen (TYLENOL) 325 MG tablet Take 650 mg by mouth every 6 (six) hours as needed for mild pain or moderate pain.    Marland Kitchen albuterol (PROAIR HFA) 108 (90 Base) MCG/ACT inhaler inhale 2 puffs by mouth every 4 hours if needed USE ONLY IF YOU ARE WHEEZING 1 Inhaler 0  . amLODipine (NORVASC) 10 MG tablet Take 1 tablet (10 mg total) by mouth daily. 90 tablet 2  . Ascorbic Acid (VITAMIN C PO) Take 1 tablet by mouth daily.    Marland Kitchen aspirin EC 81 MG tablet Take 81 mg by mouth daily.     Marland Kitchen atorvastatin  (LIPITOR) 20 MG tablet Take 1 tablet (20 mg total) by mouth daily. 90 tablet 2  . B Complex Vitamins (VITAMIN B COMPLEX) TABS Take 1 tablet by mouth daily.    . Cholecalciferol (VITAMIN D3) 5000 UNITS CAPS Take 5,000 Units by mouth daily.    . colchicine 0.6 MG tablet TAKE ONE TABLET BY MOUTH TWICE A DAILY FOR 1 WEEK, THE AS NEEDED 2 TABLETS AT ONCE WITH GOUT ONSET; MAX OF 2 PER DAY 60 tablet 11  . Flaxseed, Linseed, (FLAXSEED OIL PO) Take 1 capsule by mouth daily.    . furosemide (LASIX) 40 MG tablet TAKE 1 TABLET BY MOUTH ONCE DAILY 90 tablet 0  . gabapentin (NEURONTIN) 300 MG capsule Take 1 capsule (300 mg total)  by mouth 2 (two) times daily. 180 capsule 3  . guaiFENesin (ROBITUSSIN) 100 MG/5ML liquid Take 200 mg by mouth 3 (three) times daily as needed for cough.    . Insulin NPH, Human,, Isophane, (HUMULIN N KWIKPEN) 100 UNIT/ML Kiwkpen INJECT 30 UNITS SUBCUTANEOUSLY EVERY MORNING 30 mL 3  . levETIRAcetam (KEPPRA) 500 MG tablet Take 1 tablet (500 mg total) by mouth 2 (two) times daily. 180 tablet 3  . metoprolol tartrate (LOPRESSOR) 50 MG tablet TAKE 1 TABLET BY MOUTH ONCE DAILY WITH OR IMMEDIATELY FOLLOWING A MEAL FOR HYPERTENSION 90 tablet 0  . Multiple Vitamin (MULTIVITAMIN WITH MINERALS) TABS tablet Take 1 tablet by mouth daily.    . nitroGLYCERIN (NITROSTAT) 0.4 MG SL tablet Place 1 tablet (0.4 mg total) under the tongue every 5 (five) minutes as needed for chest pain. 20 tablet 12  . Olopatadine HCl (PAZEO) 0.7 % SOLN Apply 1 drop to eye daily. 1 Bottle 2  . pantoprazole (PROTONIX) 40 MG tablet Take 1 tablet (40 mg total) by mouth daily. 90 tablet 3  . Polyethyl Glycol-Propyl Glycol (SYSTANE ULTRA OP) Place 1 drop into both eyes every 6 (six) hours as needed (dry eyes).    . ACCU-CHEK AVIVA PLUS test strip TEST three times a day 300 each 3  . allopurinol (ZYLOPRIM) 100 MG tablet Take 1 tablet (100 mg total) by mouth daily. (Patient not taking: Reported on 12/13/2017) 90 tablet 3  . Blood  Glucose Monitoring Suppl (ACCU-CHEK AVIVA) device Use to test blood sugar three times a day. Dx. E11.9 1 each 1  . Insulin Pen Needle (PEN NEEDLES 31GX5/16") 31G X 8 MM MISC Check Blood sugar three times per day. Dx insulin using E11.8 100 each 3  . montelukast (SINGULAIR) 10 MG tablet Take 1 tablet (10 mg total) by mouth at bedtime. (Patient not taking: Reported on 12/13/2017) 30 tablet 0    Results for orders placed or performed during the hospital encounter of 12/13/17 (from the past 48 hour(s))  Basic metabolic panel     Status: Abnormal   Collection Time: 12/13/17 10:16 AM  Result Value Ref Range   Sodium 145 135 - 145 mmol/L   Potassium 3.7 3.5 - 5.1 mmol/L   Chloride 107 101 - 111 mmol/L   CO2 30 22 - 32 mmol/L   Glucose, Bld 162 (H) 65 - 99 mg/dL   BUN 14 6 - 20 mg/dL   Creatinine, Ser 1.44 (H) 0.44 - 1.00 mg/dL   Calcium 8.3 (L) 8.9 - 10.3 mg/dL   GFR calc non Af Amer 34 (L) >60 mL/min   GFR calc Af Amer 39 (L) >60 mL/min    Comment: (NOTE) The eGFR has been calculated using the CKD EPI equation. This calculation has not been validated in all clinical situations. eGFR's persistently <60 mL/min signify possible Chronic Kidney Disease.    Anion gap 8 5 - 15    Comment: Performed at [facility] [name] [place], [place] [address]  CBC     Status: Abnormal   Collection Time: 12/13/17 10:16 AM  Result Value Ref Range   WBC 9.3 4.0 - 10.5 K/uL   RBC 4.27 3.87 - 5.11 MIL/uL   Hemoglobin 10.3 (L) 12.0 - 15.0 g/dL   HCT 31.4 (L) 36.0 - 46.0 %   MCV 73.5 (L) 78.0 - 100.0 fL   MCH 24.1 (L) 26.0 - 34.0 pg   MCHC 32.8 30.0 - 36.0 g/dL   RDW 15.4 11.5 -  15.5 %   Platelets 231 150 - 400 K/uL    Comment: Performed at [facility], [place] [address] [place], [place] [address]  Brain natriuretic peptide     Status: Abnormal   Collection Time: 12/13/17 10:16 AM  Result Value Ref Range   B Natriuretic Peptide 393.4 (H) 0.0 - 100.0 pg/mL    Comment: Performed at  [facility] [address], [place], [place] [address]  I-stat troponin, ED     Status: None   Collection Time: 12/13/17 10:27 AM  Result Value Ref Range   Troponin i, poc 0.01 0.00 - 0.08 ng/mL   Comment 3            Comment: Due to the release kinetics of cTnI, a negative result within the first hours of the onset of symptoms does not rule out myocardial infarction with certainty. If myocardial infarction is still suspected, repeat the test at appropriate intervals.   Troponin I     Status: None   Collection Time: 12/13/17  2:25 PM  Result Value Ref Range   Troponin I <0.03 <0.03 ng/mL    Comment: Performed at [facility], [place] [address] [place], [place] [address]  CBC     Status: Abnormal   Collection Time: 12/13/17  2:25 PM  Result Value Ref Range   WBC 10.1 4.0 - 10.5 K/uL   RBC 4.27 3.87 - 5.11 MIL/uL   Hemoglobin 10.3 (L) 12.0 - 15.0 g/dL   HCT 31.6 (L) 36.0 - 46.0 %   MCV 74.0 (L) 78.0 - 100.0 fL   MCH 24.1 (L) 26.0 - 34.0 pg   MCHC 32.6 30.0 - 36.0 g/dL   RDW 15.5 11.5 - 15.5 %   Platelets 286 150 - 400 K/uL    Comment: Performed at [facility] [address], [place],  [address]  Creatinine, serum     Status: Abnormal   Collection Time: 12/13/17  2:25 PM  Result Value Ref Range   Creatinine, Ser 1.55 (H) 0.44 - 1.00 mg/dL   GFR calc non Af Amer 31 (L) >60 mL/min   GFR calc Af Amer 36 (L) >60 mL/min    Comment: (NOTE) The eGFR has been calculated using the CKD EPI equation. This calculation has not been validated in all clinical situations. eGFR's persistently <60 mL/min signify possible Chronic Kidney Disease. Performed at [facility], [place] [address], [place],  [address]    Dg Chest 2 View  Result Date: 12/13/2017 CLINICAL DATA:  Shortness of breath, chest tightness, dry cough EXAM: CHEST - 2 VIEW COMPARISON:  05/31/2017 FINDINGS: Cardiomegaly, vascular congestion. Interstitial prominence is increased since prior  study concerning for interstitial edema. Small bilateral effusions. IMPRESSION: Cardiomegaly with vascular congestion and probable mild interstitial edema. Small bilateral effusions. Electronically Signed   By: Rolm Baptise M.D.   On: 12/13/2017 10:38   Cardiac studies: Echocardiogram 04/06/2017: Left ventricle cavity is normal in size. Mild concentric LV hypertrophy. Akinesis/severe hypokinesis RCA territory. Visual EF is 35-40%. Doppler evidence of grade I (impaired) diastolic dysfunction, normal LAP. Calculated EF 35%. Mild aortic sclerosis. Mild (Grade I) mitral regurgitation. Normal right atrial pressure. No significant change compared to study dated 03/21/2016.  Renal artery duplex 04/06/2017: No evidence of renal artery occlusive disease in either renal artery. Normal intrarenal vascular perfusion is noted in the right kidney and mildly increased left kidney suggests medico-renal disease. Normal size bilateral kidneys.  A simple  cyst measuring 2.1(L) x 1.5(T) x 1.57(AP) cm is seen in the right kidney.   Procedures   Intravascular Pressure Wire/FFR Study  Left Heart Cath and Coronary Angiography  Conclusion   1. Findings consistent with nonischemic cardiomyopathy, LVEF 25-30% with global hypokinesis. 2. Previously placed 2.5 mm DES in the PDA branch of the right coronary artery in 2014 is widely patent. Mid RCA has a 50% stenosis, FFR 0.84, not hemodynamically significant. 3. Diffusely diseased left coronary arteries, especially involving the distal LAD and circumflex coronary artery, may be the etiology for her angina pectoris.   EKG 12/13/2017: Sinus rhythm 75 bpm.  Normal axis.  First-degree AV block , otherwise normal conduction.  Low-voltage.  No ischemic changes.  Review of Systems  Constitutional: Negative for chills and fever.  Respiratory: Positive for shortness of breath. Negative for cough.   Cardiovascular: Positive for leg swelling.       Chest tightness   Genitourinary: Negative.   Musculoskeletal: Positive for joint pain.  Neurological: Negative for dizziness and loss of consciousness.  Endo/Heme/Allergies: Does not bruise/bleed easily.  Psychiatric/Behavioral: Negative.   All other systems reviewed and are negative.   Blood pressure (!) 167/81, pulse 72, temperature 98 F (36.7 C), temperature source Oral, resp. rate 18, SpO2 97 %. Physical Exam  Vitals reviewed. Constitutional: She appears well-developed and well-nourished.  Morbidly obese  HENT:  Head: Normocephalic.  Eyes: Pupils are equal, round, and reactive to light. Conjunctivae are normal.  Neck: Normal range of motion. Neck supple. No JVD present.  Cardiovascular: Normal rate and regular rhythm.  Respiratory: Effort normal and breath sounds normal.  Dimished bilateral bases  GI: Soft. Bowel sounds are normal.  Musculoskeletal: She exhibits edema (1-2+).  Lymphadenopathy:    She has no cervical adenopathy.  Neurological: She is alert. No cranial nerve deficit.  Skin: Skin is warm and dry.  Psychiatric: She has a normal mood and affect.     Assessment:  77 year old African-American female  Chest tightness: EKG with no ischemic changes.  Troponin negative.  Partially nitroglycerin responsive chest pain without any objective evidence of ischemia. Shortness of breath: Acute on chronic systolic heatt failure CKD 3 Type 2 DM Lupus Mobid obesity H/o seizures   Recommendations:  While her chest pain is partially titrate responsive, in absence of any objective evidence of myocardial ischemia, suspect the improvement is related to preload reducing effect of nitroglycerin in the setting of heart failure exacerbation.  Recommend admission for management of acute heart failure exacerbation. IV Lasix 40 mg bid Switch metoprolol tartarate to succinate 25 milligrams daily.   She's not tolerated BiDil in the past.  She is not on ACEi/ARB due to tenuous renal function.   Continue amlodipine for BP control.   If she has any objective evidence of myocardial ischemia, will consider cardiac catheterization.  If not, would likely obtain stress tests on outpatient basis after management of acute heart failure exacerbation.  Continue baseline management for diabetes and seizure disorder.    Nigel Mormon, MD 12/13/2017, 4:39 PM  Roderica Cathell Esther Hardy, MD [facility] Pager: [phone] Office: [phone] If no answer Cell [phone]

## 2017-12-13 NOTE — ED Provider Notes (Addendum)
[facility] EMERGENCY DEPARTMENT Provider Note   CSN: [id number] Arrival date & time: 12/13/17  1000  History   Chief Complaint Chief Complaint  Patient presents with  . Chest Pain   HPI Brittany Roberts is a 77 y.o. female. With bipolar disorder, HFrEF (35% EF in 2018), nonischemic cardiomyopathy with rca stent placement per last echo in 2016, fibromyalgia, asthma , CAD, Diabetes Mellitus type 2 who presented with chest pressure and shortness of breath for 3 days. She states that she has been having chest tightness for the past 2 days substernally that is not radiating. She has accompanied shortness of breath, orthopnea, subjective lower extremity swelling.   Patient was given aspirin 324mg  and 0.4mg  SL nitroglycerin in triage which alleviated chest discomfort from 8/10 to 3/10 intensity.  Past Medical History:  Diagnosis Date  . Acute delirium 01/14/2017  . Anxiety   . Arthritis    "all over"  . Asthma   . Bipolar disorder ([place])   . Complication of anesthesia    "w/right foot OR they gave me too much and they couldn't get me woke"  . Congestive heart failure, unspecified   . Coronary artery calcification seen on CAT scan 01/14/2017  . Coronary artery disease    "I've got 1 stent" (06/30/2015)  . Depression   . Discoid lupus   . Fibromyalgia    "I've been told I have this; can't take Lyrica cause I'm allergic to it" (06/30/2015)  . GERD (gastroesophageal reflux disease)   . History of gout   . History of hiatal hernia    "real bad" (06/30/2015)  . Hypertension   . Hypothyroidism   . On home oxygen therapy    "2L q hs" (06/30/2015)  . OSA (obstructive sleep apnea)    "just use my oxygen; no mask" (06/30/2015)  . Other and unspecified hyperlipidemia   . Penetrating foot wound    left nonhealing foot wound on the dorsal surface  . Peripheral neuropathy   . Pneumonia "many of times"  . Refusal of blood transfusions as patient is Jehovah's Witness   . Renal  failure, unspecified    "kidneys not working 100%" (06/30/2015)  . Sickle cell trait ([place])   . Systemic lupus ([place])   . Thoracic aortic atherosclerosis ([place]) 01/14/2017  . Type II diabetes mellitus ([place])   . Unspecified vitamin D deficiency     Patient Active Problem List   Diagnosis Date Noted  . AKI (acute kidney injury) ([address] [address]) 09/01/2017  . Gout, arthropathy 09/01/2017  . Localization-related symptomatic epilepsy and epileptic syndromes with complex partial seizures, not intractable, without status epilepticus ([place]) 04/25/2017  . Fibromyalgia 04/08/2017  . Allergic rhinitis 03/11/2017  . Cardiomyopathy ([place]) 01/22/2017  . Hyperlipidemia associated with type 2 diabetes mellitus ([place]) 01/22/2017  . Bipolar disorder ([place]) 01/22/2017  . Seizure ([place])   . Thoracic aortic atherosclerosis ([place]) 01/14/2017  . Coronary artery calcification seen on CAT scan 01/14/2017  . Acute delirium 01/14/2017  . Possible Tonic-clonic seizure disorder ([place]) 01/14/2017  . CVA (cerebral vascular accident) ([place]) 01/14/2017  . Altered mental status 01/13/2017  . Mastalgia 04/20/2016  . Bilateral leg edema 02/10/2016  . Hearing difficulty 10/30/2015  . Midline low back pain with right-sided sciatica 09/25/2015  . Angina pectoris ([place]) 06/30/2015  . Dysarthria 02/14/2015  . Schizophrenia, unspecified type ([place]) 10/25/2014  . Type 2 diabetes mellitus with complication ([place])   . Cephalalgia   . Hx of systemic lupus erythematosus (SLE) ([place])   .  Thyroid mass 04/26/2014  . Constipation 12/10/2013  . Benign neoplasm of colon 12/10/2013  . Controlled type 2 diabetes with neuropathy ([place]) 11/27/2013  . Cystic kidney disease 11/09/2013  . Insulin dependent type 2 diabetes mellitus ([place]) 10/29/2013  . Hoarseness or changing voice 09/27/2013  . Dyskinesia, tardive 07/11/2013  . TMJ syndrome 02/16/2013  . Angina pectoris associated with type 2 diabetes mellitus ([place]) 08/08/2012  . CAD in native artery  08/08/2012  . Asthma, chronic, PRESUMED wiht MULTIFACTORIAL DYSPNEA 05/30/2012  . Insomnia 09/22/2011  . Mobility poor 02/02/2011  . Chronic kidney disease, stage III (moderate) ([place]) 05/13/2010  . Chronic combined systolic and diastolic CHF, NYHA class 2 ([place]) 03/31/2010  . COPD (chronic obstructive pulmonary disease) ([place]) 03/26/2010  . Morbid obesity ([place]) 02/27/2010  . Unstable gait 02/27/2010  . DM (diabetes mellitus), type 2 with neurological complications ([place]) 01/60/1093  . Osteoarthritis 04/25/2009  . Anemia 03/11/2007  . COGNITIVE IMPAIRMENT, MILD, SO STATED 03/11/2007  . RESTLESS LEG SYNDROME 03/11/2007  . Systemic lupus ([place]) 03/11/2007  . OSTEOARTHROSIS, GENERALIZED, MULTIPLE SITES 03/11/2007  . Hypothyroidism 03/07/2007  . DYSLIPIDEMIA 03/07/2007  . Depression 03/07/2007  . Essential hypertension 03/07/2007  . Coronary atherosclerosis 03/07/2007  . GERD 03/07/2007  . SLEEP APNEA 03/07/2007    Past Surgical History:  Procedure Laterality Date  . ANKLE ARTHROSCOPY WITH OPEN REDUCTION INTERNAL FIXATION (ORIF) Right 03/2011  . CARDIAC CATHETERIZATION N/A 07/01/2015   Procedure: Left Heart Cath and Coronary Angiography;  Surgeon: Adrian Prows, MD;  Location: [facility];  Service: Cardiovascular;  Laterality: N/A;  . CARDIAC CATHETERIZATION N/A 07/01/2015   Procedure: Intravascular Pressure Wire/FFR Study;  Surgeon: Adrian Prows, MD;  Location: [facility];  Service: Cardiovascular;  Laterality: N/A;  . CARPAL TUNNEL RELEASE Bilateral   . CATARACT EXTRACTION W/ INTRAOCULAR LENS  IMPLANT, BILATERAL Bilateral   . CHOLECYSTECTOMY OPEN    . COLONOSCOPY N/A 12/10/2013   Procedure: COLONOSCOPY;  Surgeon: Inda Castle, MD;  Location: [facility];  Service: Endoscopy;  Laterality: N/A;  . FRACTURE SURGERY    . INCISION AND DRAINAGE ABSCESS Left    foot "under my little toe"  . KNEE LIGAMENT RECONSTRUCTION Right   . LEFT HEART CATHETERIZATION WITH CORONARY ANGIOGRAM  N/A 08/08/2012   Procedure: LEFT HEART CATHETERIZATION WITH CORONARY ANGIOGRAM;  Surgeon: [name], MD;  Location: [facility] CATH LAB;  Service: Cardiovascular;  Laterality: N/A;  . NASAL SINUS SURGERY    . PERCUTANEOUS CORONARY STENT INTERVENTION (PCI-S) N/A 08/21/2012   Procedure: PERCUTANEOUS CORONARY STENT INTERVENTION (PCI-S);  Surgeon: [name], MD;  Location: [facility] CATH LAB;  Service: Cardiovascular;  Laterality: N/A;  . TUBAL LIGATION       OB History   None      Home Medications    Prior to Admission medications   Medication Sig Start Date End Date Taking? Authorizing Provider  ACCU-CHEK AVIVA PLUS test strip TEST three times a day 02/09/17   Zenia Resides, MD  acetaminophen (TYLENOL) 325 MG tablet Take 650 mg by mouth every 6 (six) hours as needed for mild pain or moderate pain. 01/24/17   [provider]  albuterol (PROAIR HFA) 108 (90 Base) MCG/ACT inhaler inhale 2 puffs by mouth every 4 hours if needed USE ONLY IF YOU ARE WHEEZING 11/22/17   [name], [name], MD  allopurinol (ZYLOPRIM) 100 MG tablet Take 1 tablet (100 mg total) by mouth daily. 10/18/17   [name], [name], MD  amLODipine ([place])  10 MG tablet Take 1 tablet (10 mg total) by mouth daily. 10/18/17   [name], [name], MD  Ascorbic Acid (VITAMIN C PO) Take 1 tablet by mouth daily.    [provider]  aspirin EC 81 MG tablet Take 162 mg by mouth daily.     [provider]  atorvastatin (LIPITOR) 20 MG tablet Take 1 tablet (20 mg total) by mouth daily. 10/18/17   [name], [name], MD  B Complex Vitamins (VITAMIN B COMPLEX) TABS Take 1 tablet by mouth daily.    [provider]  Blood Glucose Monitoring Suppl (ACCU-CHEK AVIVA) device Use to test blood sugar three times a day. Dx. E11.9 11/15/17 11/15/18  [name], [name], MD  Cholecalciferol (VITAMIN D3) 5000 UNITS CAPS Take 5,000 Units by mouth daily.    [provider]  colchicine 0.6 MG tablet TAKE ONE TABLET BY MOUTH TWICE  A DAILY FOR 1 WEEK, THE AS NEEDED 2 TABLETS AT ONCE WITH GOUT ONSET; MAX OF 2 PER DAY 11/15/17   [name], [name], MD  Flaxseed, Linseed, (FLAXSEED OIL PO) Take 1 capsule by mouth daily.    [provider]  furosemide (LASIX) 40 MG tablet TAKE 1 TABLET BY MOUTH ONCE DAILY 11/16/17   [name], [name], MD  gabapentin (NEURONTIN) 300 MG capsule Take 1 capsule (300 mg total) by mouth 2 (two) times daily. 08/30/16   Zenia Resides, MD  guaiFENesin (ROBITUSSIN) 100 MG/5ML liquid Take 200 mg by mouth 3 (three) times daily as needed for cough.    [provider]  Insulin NPH, Human,, Isophane, (HUMULIN N KWIKPEN) 100 UNIT/ML [employer] 30 UNITS SUBCUTANEOUSLY EVERY MORNING 10/18/17   [name], [name], MD  Insulin Pen Needle (PEN NEEDLES 31GX5/16") 31G X 8 MM MISC Check Blood sugar three times per day. Dx insulin using E11.8 07/15/17   [name], [name], MD  levETIRAcetam (KEPPRA) 500 MG tablet Take 1 tablet (500 mg total) by mouth 2 (two) times daily. 10/27/17   Cameron Sprang, MD  metoprolol tartrate (LOPRESSOR) 50 MG tablet TAKE 1 TABLET BY MOUTH ONCE DAILY WITH OR IMMEDIATELY FOLLOWING A MEAL FOR HYPERTENSION 11/16/17   [name], [name], MD  montelukast (SINGULAIR) 10 MG tablet Take 1 tablet (10 mg total) by mouth at bedtime. 07/19/17   [name], [name], MD  Multiple Vitamin (MULTIVITAMIN WITH MINERALS) TABS tablet Take 1 tablet by mouth daily.    [provider]  nitroGLYCERIN (NITROSTAT) 0.4 MG SL tablet Place 1 tablet (0.4 mg total) under the tongue every 5 (five) minutes as needed for chest pain. 05/13/16   Zenia Resides, MD  Olopatadine HCl (PAZEO) 0.7 % SOLN Apply 1 drop to eye daily. 11/02/17   [name], [name], MD  pantoprazole (PROTONIX) 40 MG tablet Take 1 tablet (40 mg total) by mouth daily. 10/18/17   [name], [name], MD  Polyethyl Glycol-Propyl Glycol (SYSTANE ULTRA OP) Place 1 drop into both eyes every 6 (six) hours as needed (dry eyes).    [provider]     Family History Family History  Problem Relation Age of Onset  . Heart disease Mother   . Diabetes Mother   . Clotting disorder Mother   . Pneumonia Father   . Rheum arthritis Father   . Diabetes Sister   . Diabetes Sister   . Asthma Brother   . Cancer Brother   . Kidney disease Brother   . Lupus Son   . Heart disease Son   .  Heart disease Daughter   . Colon cancer Neg Hx     Social History Social History   Tobacco Use  . Smoking status: Former Smoker    Packs/day: 3.00    Years: 40.00    Pack years: 120.00    Types: Cigarettes    Start date: 07/05/1960    Last attempt to quit: 07/05/1998    Years since quitting: 19.4  . Smokeless tobacco: Never Used  Substance Use Topics  . Alcohol use: No    Alcohol/week: 0.0 oz    Comment: 06/30/2015 "used to drink a little alcohol on the weekends; not q weekend; nothing in years"  . Drug use: No     Allergies   Ace inhibitors; Bidil [isosorb dinitrate-hydralazine]; Penicillins; Buspar [buspirone]; Pregabalin; Ropinirole hydrochloride; and Amantadines   Review of Systems Review of Systems  Constitutional: Positive for chills and diaphoresis.  Respiratory: Positive for chest tightness and shortness of breath.   Cardiovascular: Negative for palpitations.  Gastrointestinal: Positive for nausea.    Physical Exam Updated Vital Signs BP 140/71   Pulse 69   Temp 98.8 F (37.1 C) (Oral)   Resp 20   SpO2 94%   Physical Exam  Constitutional: She appears well-developed and well-nourished.  Non-toxic appearance. She does not appear ill.  HENT:  Head: Normocephalic and atraumatic.  Cardiovascular: Normal rate and regular rhythm.  Chest wall tenderness  Pulmonary/Chest: Effort normal and breath sounds normal. No accessory muscle usage. No respiratory distress.  Abdominal: Soft. Bowel sounds are normal. She exhibits no distension. There is tenderness (left umbilical and left lower quadrant pain).  Musculoskeletal:        Right lower leg: She exhibits no edema.       Left lower leg: She exhibits no edema.  Neurological: She is alert.  Skin: No erythema.    ED Treatments / Results  Labs (all labs ordered are listed, but only abnormal results are displayed) Labs Reviewed  BASIC METABOLIC PANEL - Abnormal; Notable for the following components:      Result Value   Glucose, Bld 162 (*)    Creatinine, Ser 1.44 (*)    Calcium 8.3 (*)    GFR calc non Af Amer 34 (*)    GFR calc Af Amer 39 (*)    All other components within normal limits  CBC - Abnormal; Notable for the following components:   Hemoglobin 10.3 (*)    HCT 31.4 (*)    MCV 73.5 (*)    MCH 24.1 (*)    All other components within normal limits  BRAIN NATRIURETIC PEPTIDE  I-STAT TROPONIN, ED    EKG EKG Interpretation  Date/Time:  Tuesday December 13 2017 10:07:32 EDT Ventricular Rate:  75 PR Interval:    QRS Duration: 94 QT Interval:  430 QTC Calculation: 481 R Axis:   -27 Text Interpretation:  Sinus rhythm Borderline prolonged PR interval Borderline left axis deviation Low voltage, extremity and precordial leads No significant change since last tracing Confirmed by Wandra Arthurs [phone]) on 12/13/2017 10:18:39 AM   Radiology Dg Chest 2 View  Result Date: 12/13/2017 CLINICAL DATA:  Shortness of breath, chest tightness, dry cough EXAM: CHEST - 2 VIEW COMPARISON:  05/31/2017 FINDINGS: Cardiomegaly, vascular congestion. Interstitial prominence is increased since prior study concerning for interstitial edema. Small bilateral effusions. IMPRESSION: Cardiomegaly with vascular congestion and probable mild interstitial edema. Small bilateral effusions. Electronically Signed   By: Rolm Baptise M.D.   On: 12/13/2017 10:38  Procedures Procedures (including critical care time)  Medications Ordered in ED Medications  nitroPRUSSide (NIPRIDE) 50 mg in dextrose 5 % 250 mL (0.2 mg/mL) infusion (has no administration in time range)  furosemide (LASIX)  injection 40 mg (40 mg Intravenous Given 12/13/17 1211)     Initial Impression / Assessment and Plan / ED Course  I have reviewed the triage vital signs and the nursing notes.  Pertinent labs & imaging results that were available during my care of the patient were reviewed by me and considered in my medical decision making (see chart for details).    Brittany Roberts is a 77 y.o f with nonischemic cardiomyopathy with rca/pda stent, HFrEF 25-30% with global hypokinesis who presents with 2-3 day history of chest tightness and shortness of breath that was alleviated somewhat with aspirin and sl nitroglycerin.   Patient is high risk with a heart score of 5. Low voltage EKG with sinus rhythm. Troponin 0.01. Chest xray shows vascular congestion, interstitial edema, and small bilateral effusions. Renal function stable at baseline. Patients's bnp is pending. She was given IV lasix 40mg  once. Does not have crackles on exam or pitting edema.   Called Dr. Einar Gip to admit patient for CAD rule out. The patient was started on nitroglycerin drip.   Final Clinical Impressions(s) / ED Diagnoses   Final diagnoses:  None    ED Discharge Orders    None       Lars Mage, MD 12/13/17 1233    Lars Mage, MD 12/13/17 1234    Lars Mage, MD 12/13/17 1237    Drenda Freeze, MD 12/18/17 [phone]

## 2017-12-13 NOTE — ED Notes (Signed)
Cardiology at bedside.

## 2017-12-13 NOTE — ED Notes (Signed)
ED Provider at bedside. 

## 2017-12-13 NOTE — Telephone Encounter (Signed)
Pt short of breath   By herself   At home uses inhaler 911  Called  Stayed on line till ems arrived   Reason for Disposition . Sounds like a life-threatening emergency to the triager  Answer Assessment - Initial Assessment Questions 1. RESPIRATORY STATUS: "Describe your breathing?" (e.g., wheezing, shortness of breath, unable to speak, severe coughing)      Shortness of breath  occasional breathing   2. ONSET: "When did this breathing problem begin?"      3  Days ago   3. PATTERN "Does the difficult breathing come and go, or has it been constant since it started?"       Constant   4. SEVERITY: "How bad is your breathing?" (e.g., mild, moderate, severe)    - MILD: No SOB at rest, mild SOB with walking, speaks normally in sentences, can lay down, no retractions, pulse < 100.    - MODERATE: SOB at rest, SOB with minimal exertion and prefers to sit, cannot lie down flat, speaks in phrases, mild retractions, audible wheezing, pulse 100-120.    - SEVERE: Very SOB at rest, speaks in single words, struggling to breathe, sitting hunched forward, retractions, pulse > 120       Severe   5. RECURRENT SYMPTOM: "Have you had difficulty breathing before?" If so, ask: "When was the last time?" and "What happened that time?"         Yes  - cannot remember   6. CARDIAC HISTORY: "Do you have any history of heart disease?" (e.g., heart attack, angina, bypass surgery, angioplasty)          Stint   Chf  7. LUNG HISTORY: "Do you have any history of lung disease?"  (e.g., pulmonary embolus, asthma, emphysema)     COPD 8. CAUSE: "What do you think is causing the breathing problem?"        POLP 9. OTHER SYMPTOMS: "Do you have any other symptoms? (e.g., dizziness, runny nose, cough, chest pain, fever)        ChEST TIGHTNESS   10. PREGNANCY: "Is there any chance you are pregnant?" "When was your last menstrual period?"       N/A 11. TRAVEL: "Have you traveled out of the country in the last month?" (e.g., travel  history, exposures)         N/A  Protocols used: BREATHING DIFFICULTY-A-AH

## 2017-12-13 NOTE — Telephone Encounter (Signed)
Confirmed patient has arrived in the ER now. 

## 2017-12-13 NOTE — ED Notes (Signed)
Got patient into a gown on the monitor did ekg shown to er doctor patient is resting with nurse at bedside and call bell in reach

## 2017-12-14 ENCOUNTER — Inpatient Hospital Stay (HOSPITAL_COMMUNITY): Payer: Medicare Other

## 2017-12-14 LAB — GLUCOSE, CAPILLARY
Glucose-Capillary: 136 mg/dL — ABNORMAL HIGH (ref 65–99)
Glucose-Capillary: 194 mg/dL — ABNORMAL HIGH (ref 65–99)
Glucose-Capillary: 70 mg/dL (ref 65–99)
Glucose-Capillary: 78 mg/dL (ref 65–99)

## 2017-12-14 LAB — BASIC METABOLIC PANEL
Anion gap: 10 (ref 5–15)
BUN: 13 mg/dL (ref 6–20)
CO2: 29 mmol/L (ref 22–32)
Calcium: 8.4 mg/dL — ABNORMAL LOW (ref 8.9–10.3)
Chloride: 104 mmol/L (ref 101–111)
Creatinine, Ser: 1.33 mg/dL — ABNORMAL HIGH (ref 0.44–1.00)
GFR calc Af Amer: 43 mL/min — ABNORMAL LOW (ref >60)
GFR calc non Af Amer: 37 mL/min — ABNORMAL LOW (ref >60)
Glucose, Bld: 125 mg/dL — ABNORMAL HIGH (ref 65–99)
Potassium: 3.4 mmol/L — ABNORMAL LOW (ref 3.5–5.1)
Sodium: 143 mmol/L (ref 135–145)

## 2017-12-14 LAB — ECHOCARDIOGRAM COMPLETE: Weight: 4302.4 oz

## 2017-12-14 MED ORDER — TORSEMIDE 20 MG PO TABS
20.0000 mg | ORAL_TABLET | Freq: Two times a day (BID) | ORAL | Status: DC
  Administered 2017-12-14 – 2017-12-15 (×3): 20 mg via ORAL
  Filled 2017-12-14 (×3): qty 1

## 2017-12-14 MED ORDER — POTASSIUM CHLORIDE CRYS ER 20 MEQ PO TBCR
40.0000 meq | EXTENDED_RELEASE_TABLET | Freq: Once | ORAL | Status: AC
  Administered 2017-12-14: 40 meq via ORAL
  Filled 2017-12-14: qty 2

## 2017-12-14 NOTE — Care Management Note (Signed)
Case Management Note  Patient Details  Name: Brittany Roberts MRN: [id number] Date of Birth: 03-May-1941  Subjective/Objective:  Chest Tightness                 Action/Plan: Patient lives at home; PCP is Dr Betty [name]; has private insurance with [facility] with prescription drug coverage; awaiting for Physical Therapy eval for disposition needs; CM will continue to follow for progression of care.  Expected Discharge Date:    possible 12/18/2017              Expected Discharge Plan:  [place]  In-House Referral:   [facility]  Discharge planning Services  CM Consult  Status of Service:  In process, will continue to follow  Sherrilyn Rist [phone] 12/14/2017, 4:03 PM

## 2017-12-14 NOTE — Progress Notes (Signed)
Subjective:  Shortness of breath much improved.   Objective:  Vital Signs in the last 24 hours: Temp:  [97 F (36.1 C)-98.2 F (36.8 C)] 98.2 F (36.8 C) (06/12 0952) Pulse Rate:  [60-78] 78 (06/12 0952) Resp:  [17-23] 18 (06/12 0556) BP: (121-172)/(32-110) 132/55 (06/12 0952) SpO2:  [91 %-100 %] 91 % (06/12 0952) Weight:  [122 kg (268 lb 14.4 oz)] 122 kg (268 lb 14.4 oz) (06/12 0556)  Intake/Output from previous day: 06/11 0701 - 06/12 0700 In: 4.3 [I.V.:4.3] Out: 2150 [Urine:2150] Intake/Output from this shift: Total I/O In: -  Out: 400 [Urine:400]  Physical Exam: Vitals reviewed. Constitutional: She appears well-developed and well-nourished.  Morbidly obese  HENT:  Head: Normocephalic.  Eyes: Pupils are equal, round, and reactive to light. Conjunctivae are normal.  Neck: Normal range of motion. Neck supple. No JVD present.  Cardiovascular: Normal rate and regular rhythm.  Respiratory: Effort normal and breath sounds normal.  Dimished bilateral bases  GI: Soft. Bowel sounds are normal.  Musculoskeletal: She exhibits trace edema.  Lymphadenopathy:    She has no cervical adenopathy.  Neurological: She is alert. No cranial nerve deficit.  Skin: Skin is warm and dry.  Psychiatric: She has a normal mood and affect.       Lab Results: Recent Labs    12/13/17 1016 12/13/17 1425  WBC 9.3 10.1  HGB 10.3* 10.3*  PLT 231 286   Recent Labs    12/13/17 1016 12/13/17 1425 12/14/17 0615  NA 145  --  143  K 3.7  --  3.4*  CL 107  --  104  CO2 30  --  29  GLUCOSE 162*  --  125*  BUN 14  --  13  CREATININE 1.44* 1.55* 1.33*   Recent Labs    12/13/17 1425  TROPONINI <0.03   Cardiac studies: [facility] echocardiogram 12/14/2017: - Left ventricle: The cavity size was normal. Systolic function was   mildly reduced. The estimated ejection fraction was in the range   of 45% to 50%. Akinesis of the inferior myocardium. Features are  consistent with a pseudonormal left ventricular filling pattern,   with concomitant abnormal relaxation and increased filling   pressure (grade 2 diastolic dysfunction). - Mitral valve: Mildly thickened leaflets . Chordal calcification.   There was mild regurgitation. Valve area by pressure half-time:   1.47 cm^2. - Left atrium: The atrium was mildly dilated. - Right atrium: The atrium was mildly dilated. - Pericardium, extracardiac: Trace pericardial effusion was   identified. - Significant improvement in LVEF compared to previous outpatient   echocardiogram in 2018.  Impressions:  - HFpEF with grade 2 diastolic dysfunction.   Mild biatrial dilatation.   Mild mitral regurgitation.   Renal artery duplex 04/06/2017: No evidence of renal artery occlusive disease in either renal artery. Normal intrarenal vascular perfusion is noted in the right kidney and mildly increased left kidney suggests medico-renal disease. Normal size bilateral kidneys.  A simple cyst measuring 2.1(L) x 1.5(T) x 1.57(AP) cm is seen in the right kidney.   Procedures   Intravascular Pressure Wire/FFR Study  Left Heart Cath and Coronary Angiography  Conclusion   1. Findings consistent with nonischemic cardiomyopathy, LVEF 25-30% with global hypokinesis. 2. Previously placed 2.5 mm DES in the PDA branch of the right coronary artery in 2014 is widely patent. Mid RCA has a 50% stenosis, FFR 0.84, not hemodynamically significant. 3. Diffusely diseased left coronary arteries, especially involving the distal LAD and  circumflex coronary artery, may be the etiology for her angina pectoris.   EKG 12/13/2017: Sinus rhythm 75 bpm.  Normal axis.  First-degree AV block , otherwise normal conduction.  Low-voltage.  No ischemic changes.   Assessment:  77 year old African-American female  Chest tightness: EKG with no ischemic changes.  Troponin negative.  Partially nitroglycerin responsive chest pain without  any objective evidence of ischemia. Shortness of breath: Acute on chronic diastolic heart failure HFpEF CKD 3 Type 2 DM Lupus Mobid obesity H/o seizures   Recommendations: Switch IV lasix to PO torsemide 20 mg bid Discussed heart failure diet with the patient. Continue metoprolol succinate 25 mg daily. Continue amlodipine for BP control.  Continue baseline management for diabetes and seizure disorder.  Will consider outpatient stress test.    LOS: 1 day    Brittany Roberts Brittany Roberts 12/14/2017, 10:55 AM  [place], MD [facility] Pager: [phone] Office: [phone] If no answer Cell [phone]

## 2017-12-14 NOTE — Progress Notes (Signed)
Visited with patient and her daughter.  Provided Advanced Directive education and daughter said they would complete it this evening and hopefully would like to get it notarized tomorrow morning.  Please let Chaplain know once it's filled out and we will work with them to get it notarized.  Happy to assist.    12/14/17 1500  Clinical Encounter Type  Visited With Patient and family together  Visit Type Initial;Spiritual support  Spiritual Encounters  Spiritual Needs Literature (Advanced Directive education)

## 2017-12-14 NOTE — Evaluation (Signed)
Physical Therapy Evaluation Patient Details Name: Brittany Roberts MRN: [id number] DOB: [birth year] Today's Date: 12/14/2017   History of Present Illness  Pt is a 77 y/o female admitted secondary to CHF exacerbation. PMH includes bipolar disorder, asthma, CAD, DM, sickle cell, fibromyalgia, gout, HTN, R ankle ORIF, and on 2L of oxygen at home.   Clinical Impression  Pt admitted secondary to problem above with deficits below. Pt unsteady during gait, and only able to tolerate limited gait distance secondary to fatigue and SOB. DOE at 2-3/4. Oxygen sats decreased to 82% on 2L during ambulation, however, with seated rest and cues for pursed lip breathing, oxygen sats returned to 95% on 2L. Feel pt is at a high risk for falls and lives alone. An aide comes for 2-4 hours, 5 days/week, however, pt does not have 24/7 assist. Will continue to follow acutely to maximize functional mobility independence and safety.     Follow Up Recommendations SNF;Supervision/Assistance - 24 hour    Equipment Recommendations  3in1 (PT)    Recommendations for Other Services OT consult     Precautions / Restrictions Precautions Precautions: Fall Precaution Comments: Reports falling at home; 2 times within the past 6 month.  Restrictions Weight Bearing Restrictions: No      Mobility  Bed Mobility Overal bed mobility: Needs Assistance Bed Mobility: Sit to Supine;Supine to Sit     Supine to sit: Supervision Sit to supine: Supervision   General bed mobility comments: Supervision and increased time required to perform bed mobility tasks.   Transfers Overall transfer level: Needs assistance Equipment used: Rolling walker (2 wheeled) Transfers: Sit to/from Stand Sit to Stand: Min assist         General transfer comment: Min A for lift assist and steadying. Verbal cues for safe hand placement.   Ambulation/Gait Ambulation/Gait assistance: Min assist Ambulation Distance (Feet): 20 Feet Assistive device:  Rolling walker (2 wheeled) Gait Pattern/deviations: Step-through pattern;Decreased stride length Gait velocity: Decreased.    General Gait Details: Slow, unsteady gait. Pt requiring min A and cues for safe use of RW throughout. Pt with increased SOB during gait as well, so only able to tolerate limited distance. Oxygen sats decreasing to 82% on 2L of oxygen, however, increasing to 95% on 2L with seated rest.   Stairs            Wheelchair Mobility    Modified Rankin (Stroke Patients Only)       Balance Overall balance assessment: Needs assistance Sitting-balance support: No upper extremity supported;Feet supported Sitting balance-Leahy Scale: Fair     Standing balance support: Bilateral upper extremity supported;During functional activity Standing balance-Leahy Scale: Poor Standing balance comment: Reliant on BUE support.                              Pertinent Vitals/Pain Pain Assessment: 0-10 Pain Score: 7  Pain Location: back  Pain Descriptors / Indicators: Sore Pain Intervention(s): Limited activity within patient's tolerance;Monitored during session;Repositioned    Home Living Family/patient expects to be discharged to:: Private residence Living Arrangements: Alone Available Help at Discharge: Personal care attendant;Available PRN/intermittently Type of Home: Apartment Home Access: Level entry     Home Layout: One level Home Equipment: Walker - 4 wheels;Wheelchair - manual;Shower seat      Prior Function Level of Independence: Needs assistance   Gait / Transfers Assistance Needed: Uses rollator for ambulation   ADL's / Homemaking Assistance Needed: Aide assist with cooking,  bathing/dressing        Hand Dominance   Dominant Hand: Right    Extremity/Trunk Assessment   Upper Extremity Assessment Upper Extremity Assessment: Defer to OT evaluation    Lower Extremity Assessment Lower Extremity Assessment: Generalized weakness;RLE  deficits/detail RLE Deficits / Details: Pt reports RLE buckles during mobility sometimes, causing her to fall.     Cervical / Trunk Assessment Cervical / Trunk Assessment: Kyphotic  Communication   Communication: No difficulties  Cognition Arousal/Alertness: Awake/alert Behavior During Therapy: WFL for tasks assessed/performed Overall Cognitive Status: Within Functional Limits for tasks assessed                                        General Comments      Exercises     Assessment/Plan    PT Assessment Patient needs continued PT services  PT Problem List Decreased strength;Cardiopulmonary status limiting activity;Decreased knowledge of use of DME;Decreased mobility;Decreased balance;Decreased activity tolerance       PT Treatment Interventions Gait training;DME instruction;Functional mobility training;Therapeutic activities;Balance training;Therapeutic exercise;Patient/family education    PT Goals (Current goals can be found in the Care Plan section)  Acute Rehab PT Goals Patient Stated Goal: to go home  PT Goal Formulation: With patient Time For Goal Achievement: 12/28/17 Potential to Achieve Goals: Good    Frequency Min 2X/week   Barriers to discharge Decreased caregiver support      Co-evaluation               AM-PAC PT "6 Clicks" Daily Activity  Outcome Measure Difficulty turning over in bed (including adjusting bedclothes, sheets and blankets)?: A Little Difficulty moving from lying on back to sitting on the side of the bed? : A Little Difficulty sitting down on and standing up from a chair with arms (e.g., wheelchair, bedside commode, etc,.)?: Unable Help needed moving to and from a bed to chair (including a wheelchair)?: A Little Help needed walking in hospital room?: A Little Help needed climbing 3-5 steps with a railing? : A Lot 6 Click Score: 15    End of Session Equipment Utilized During Treatment: Gait belt;Oxygen Activity  Tolerance: Patient limited by fatigue Patient left: in bed;with call bell/phone within reach;with bed alarm set Nurse Communication: Mobility status PT Visit Diagnosis: Other abnormalities of gait and mobility (R26.89);History of falling (Z91.81);Muscle weakness (generalized) (M62.81);Unsteadiness on feet (R26.81)    Time: 4332-9518 PT Time Calculation (min) (ACUTE ONLY): 20 min   Charges:   PT Evaluation $PT Eval Moderate Complexity: 1 Mod     PT G Codes:        Leighton Ruff, PT, DPT  Acute Rehabilitation Services  Pager: [phone]   Rudean Hitt 12/14/2017, 7:04 PM

## 2017-12-14 NOTE — Consult Note (Signed)
   [facility] CM Inpatient Consult   12/14/2017  Brittany Roberts 1941-05-16 [id number]  Patient screened for potential needs in [place] Management for  services. Patient is in the [facility] Management services under patient's [employer] plan.   Met with patient and her daughter [name] at the bedside.  She states she has a personal care aide that comes Monday through Friday from [place] for 2 - 4 hours daily. Spoke with daughter regarding any issues with transportation.  She states the patient knows about and accesses the services from [facility]. Currently, they deny any additional needs for service.  A brochure, 24 hour nurse advise line was given also how to access [place] MD visits from the phone.  Daughter states I will look over the information and can use my phone. Information was provided. Primary Care Provider is Betty [name], MD [place] and this office provides the post hospital transition of care calls and follow up.  For questions contact:   Natividad Brood, RN BSN [place] [facility] Liaison  [phone] business mobile phone Toll free office [phone]

## 2017-12-14 NOTE — Progress Notes (Signed)
  Echocardiogram 2D Echocardiogram has been performed.  Zafir Schauer T Shriya Aker 12/14/2017, 9:09 AM

## 2017-12-15 LAB — BASIC METABOLIC PANEL
Anion gap: 12 (ref 5–15)
BUN: 16 mg/dL (ref 6–20)
CO2: 31 mmol/L (ref 22–32)
Calcium: 8.9 mg/dL (ref 8.9–10.3)
Chloride: 100 mmol/L — ABNORMAL LOW (ref 101–111)
Creatinine, Ser: 1.47 mg/dL — ABNORMAL HIGH (ref 0.44–1.00)
GFR calc Af Amer: 38 mL/min — ABNORMAL LOW (ref >60)
GFR calc non Af Amer: 33 mL/min — ABNORMAL LOW (ref >60)
Glucose, Bld: 106 mg/dL — ABNORMAL HIGH (ref 65–99)
Potassium: 3.6 mmol/L (ref 3.5–5.1)
Sodium: 143 mmol/L (ref 135–145)

## 2017-12-15 LAB — GLUCOSE, CAPILLARY: Glucose-Capillary: 93 mg/dL (ref 65–99)

## 2017-12-15 MED ORDER — FUROSEMIDE 40 MG PO TABS: 40.0000 mg | ORAL_TABLET | Freq: Every day | ORAL | 0 refills | Status: DC

## 2017-12-15 NOTE — Progress Notes (Signed)
Pt IV discontinued, catheter intact and telemetry removed. Heart failure booklet went over at bedside with pt and pt family. Pt discharge teaching went over at bedside with pt. Pt has all belongings packed. Pt states she wants to see the chaplin to complete her advance directive before leaving. Placed order and called chaplin. Primary RN aware

## 2017-12-15 NOTE — Discharge Summary (Signed)
Physician Discharge Summary  Patient ID: Brittany Roberts MRN: [id number] DOB/AGE: May 02, 1941 77 y.o.  Admit date: 12/13/2017 Discharge date: 12/15/2017  Primary Discharge Diagnosis Acute on chronic diastolic heart failure.  Patient presently on beta-blockers and diuretics.  Not on ACE inhibitor due to renal failure.  Total fluid diuresis of approximately 3.5 L.  Secondary Discharge Diagnosis Controlled diabetes mellitus with stage III chronic kidney disease Coronary artery disease of the native vessel with Other forms of angina pectoris.  Distal RCA stent 2014.  Mid RCA 50% stenosis by angiography in 2016. Hypertension Hyperlipidemia Morbid obesity  Significant Diagnostic Studies:   Echocardiogram 12/14/2017: Mild decrease in LV systolic function, EF 45 to 50% with inferior akinesis, grade 2 diastolic dysfunction, mild mitral stenosis, mild left atrial enlargement, no significant change compared to 2018 study.  EKG 12/14/2017: Sinus rhythm with first-degree AV block at rate of 75 bpm, left axis deviation, left anterior fascicular block, cannot exclude inferior infarct old.  Poor R wave progression, cannot exclude anteroseptal infarct old.  Diffuse nonspecific T abnormality.  Low voltage complexes.   Hospital Course:  Patient admitted with acute diastolic heart failure and chest pain, ruled out for myocardial infarction, she was aggressively diuresed, with resolution of symptoms.  Felt stable for discharge with outpatient follow-up.  Extensive discussion was held with the patient regarding fluid management and also salt restriction and calorie restriction.  Recommendations on discharge: She will resume her home medications, will increase furosemide to 40 mg twice daily for the next 3 days.  CHF pathway including dietary changes that need to be met with discussed in detail.  I would like to see her back within the next 1 week to 10 days for close monitoring of CHF.  Discharge Exam: Blood  pressure 138/70, pulse 78, temperature 98.4 F (36.9 C), temperature source Oral, resp. rate 18, height 5' 3"  (1.6 m), weight 121 kg (266 lb 11.2 oz), SpO2 100 %.   General appearance: alert, cooperative, appears stated age, no distress and morbidly obese Resp: clear to auscultation bilaterally Cardio: regular rate and rhythm, S1, S2 normal, no murmur, click, rub or gallop and distant heart sounds GI: soft, non-tender; bowel sounds normal; no masses,  no organomegaly and Large pannus present Extremities: extremities normal, atraumatic, no cyanosis or edema Pulses: Carotid pulses normal, femoral pulses difficult to feel due to obesity, popliteal pulse 1-2+, pedal pulses are faint at 1+. Neurologic: Grossly normal Labs:   Lab Results  Component Value Date   WBC 10.1 12/13/2017   HGB 10.3 (L) 12/13/2017   HCT 31.6 (L) 12/13/2017   MCV 74.0 (L) 12/13/2017   PLT 286 12/13/2017    Recent Labs  Lab 12/15/17 0708  NA 143  K 3.6  CL 100*  CO2 31  BUN 16  CREATININE 1.47*  CALCIUM 8.9  GLUCOSE 106*    Lipid Panel     Component Value Date/Time   CHOL 103 07/19/2014 1239   TRIG 81 07/19/2014 1239   HDL 38 (L) 07/19/2014 1239   CHOLHDL 2.7 07/19/2014 1239   VLDL 16 07/19/2014 1239   LDLCALC 49 07/19/2014 1239    BNP (last 3 results) Recent Labs    12/13/17 1016  BNP 393.4*    HEMOGLOBIN A1C Lab Results  Component Value Date   HGBA1C 6.5 07/19/2017   MPG 160 01/15/2017    Cardiac Panel (last 3 results) Recent Labs    09/01/17 1453 12/13/17 [place]  --  <  0.03    Lab Results  Component Value Date   CKTOTAL 360 (H) 09/01/2017   TROPONINI <0.03 12/13/2017     TSH Recent Labs    02/15/17 1623 04/08/17 1242 11/02/17 1030  TSH 0.09* 0.63 1.15   Radiology: Dg Chest 2 View  Result Date: 12/13/2017 CLINICAL DATA:  Shortness of breath, chest tightness, dry cough EXAM: CHEST - 2 VIEW COMPARISON:  05/31/2017 FINDINGS: Cardiomegaly,  vascular congestion. Interstitial prominence is increased since prior study concerning for interstitial edema. Small bilateral effusions. IMPRESSION: Cardiomegaly with vascular congestion and probable mild interstitial edema. Small bilateral effusions. Electronically Signed   By: Rolm Baptise M.D.   On: 12/13/2017 10:38      FOLLOW UP PLANS AND APPOINTMENTS  Allergies as of 12/15/2017      Reactions   Ace Inhibitors Other (See Comments)   Coincided with sig bump in creat. Retried and creat bumped again.   Bidil [isosorb Dinitrate-hydralazine] Other (See Comments)   Sleep all the time.   Penicillins Anaphylaxis, Swelling, Rash   Has patient had a PCN reaction causing immediate rash, facial/tongue/throat swelling, SOB or lightheadedness with hypotension: Yes Has patient had a PCN reaction causing severe rash involving mucus membranes or skin necrosis: Yes Has patient had a PCN reaction that required hospitalization: Yes Has patient had a PCN reaction occurring within the last 10 years: No If all of the above answers are "NO", then may proceed with Cephalosporin use.   Buspar [buspirone] Other (See Comments)   pain   Pregabalin Swelling   Ropinirole Hydrochloride Swelling   Amantadines Rash   "Swelling of the tongue"      Medication List    TAKE these medications   ACCU-CHEK AVIVA device Use to test blood sugar three times a day. Dx. E11.9   ACCU-CHEK AVIVA PLUS test strip Generic drug:  glucose blood TEST three times a day   acetaminophen 325 MG tablet Commonly known as:  TYLENOL Take 650 mg by mouth every 6 (six) hours as needed for mild pain or moderate pain.   albuterol 108 (90 Base) MCG/ACT inhaler Commonly known as:  PROAIR HFA inhale 2 puffs by mouth every 4 hours if needed USE ONLY IF YOU ARE WHEEZING   allopurinol 100 MG tablet Commonly known as:  ZYLOPRIM Take 1 tablet (100 mg total) by mouth daily.   amLODipine 10 MG tablet Commonly known as:  NORVASC Take 1  tablet (10 mg total) by mouth daily.   aspirin EC 81 MG tablet Take 81 mg by mouth daily.   atorvastatin 20 MG tablet Commonly known as:  LIPITOR Take 1 tablet (20 mg total) by mouth daily.   colchicine 0.6 MG tablet TAKE ONE TABLET BY MOUTH TWICE A DAILY FOR 1 WEEK, THE AS NEEDED 2 TABLETS AT ONCE WITH GOUT ONSET; MAX OF 2 PER DAY   FLAXSEED OIL PO Take 1 capsule by mouth daily.   furosemide 40 MG tablet Commonly known as:  LASIX Take 1 tablet (40 mg total) by mouth daily. Take am and 3 pm until 12/19/2017  (Twice daily for 3 more days) then once a day What changed:  additional instructions   gabapentin 300 MG capsule Commonly known as:  NEURONTIN Take 1 capsule (300 mg total) by mouth 2 (two) times daily.   guaiFENesin 100 MG/5ML liquid Commonly known as:  ROBITUSSIN Take 200 mg by mouth 3 (three) times daily as needed for cough.   Insulin NPH (Human) (Isophane) 100 UNIT/ML Kiwkpen Commonly  known as:  HUMULIN N KWIKPEN INJECT 30 UNITS SUBCUTANEOUSLY EVERY MORNING   levETIRAcetam 500 MG tablet Commonly known as:  KEPPRA Take 1 tablet (500 mg total) by mouth 2 (two) times daily.   metoprolol succinate 50 MG 24 hr tablet Commonly known as:  TOPROL-XL Take 50 mg by mouth daily. Take with or immediately following a meal.   montelukast 10 MG tablet Commonly known as:  SINGULAIR Take 1 tablet (10 mg total) by mouth at bedtime.   multivitamin with minerals Tabs tablet Take 1 tablet by mouth daily.   nitroGLYCERIN 0.4 MG SL tablet Commonly known as:  NITROSTAT Place 1 tablet (0.4 mg total) under the tongue every 5 (five) minutes as needed for chest pain.   Olopatadine HCl 0.7 % Soln Commonly known as:  PAZEO Apply 1 drop to eye daily.   pantoprazole 40 MG tablet Commonly known as:  PROTONIX Take 1 tablet (40 mg total) by mouth daily.   PEN NEEDLES 31GX5/16" 31G X 8 MM Misc Check Blood sugar three times per day. Dx insulin using E11.8   SYSTANE ULTRA OP Place 1  drop into both eyes every 6 (six) hours as needed (dry eyes).   Vitamin B Complex Tabs Take 1 tablet by mouth daily.   VITAMIN C PO Take 1 tablet by mouth daily.   Vitamin D3 5000 units Caps Take 5,000 Units by mouth daily.       Adrian Prows, MD 12/15/2017, 8:33 AM  Pager: [phone] Office: [phone] If no answer: [phone]

## 2017-12-15 NOTE — Progress Notes (Signed)
Visited with patient per scc to assist with AD.  Upon arrival AD not complete. Chaplain went over AD and patient and family wants to take home to discuss further. Jaclynn Major, [place], [facility], Pager [phone]

## 2017-12-26 ENCOUNTER — Other Ambulatory Visit: Payer: Self-pay

## 2017-12-26 DIAGNOSIS — Z9861 Coronary angioplasty status: Secondary | ICD-10-CM | POA: Diagnosis not present

## 2017-12-26 DIAGNOSIS — I10 Essential (primary) hypertension: Secondary | ICD-10-CM | POA: Diagnosis not present

## 2017-12-26 DIAGNOSIS — I2510 Atherosclerotic heart disease of native coronary artery without angina pectoris: Secondary | ICD-10-CM | POA: Diagnosis not present

## 2017-12-26 DIAGNOSIS — I5032 Chronic diastolic (congestive) heart failure: Secondary | ICD-10-CM | POA: Diagnosis not present

## 2017-12-26 NOTE — Patient Outreach (Signed)
[facility]) Care Management  12/26/2017  Brittany Roberts 25-Apr-1941 [id number]  EMMI- Heart Failure RED ON EMMI ALERT Day # 8 Date: 12/23/17 Red Alert Reason:  Martin Majestic to follow up appointment? no  Outreach attempt # 1 Spoke with patient. She is able to verify HIPAA.  Patient reports she is doing good. Addressed red alert with patient.  She reports that she has an appointment with her heart doctor Dr. Lucius Conn today.  Patient states she has transportation to her appointment.  Discussed with patient signs of heart failure and when to notify physician.  She verbalized understanding.  Discussed with patient [facility] services and how we could support patient.  She states she gets daily phone call from a nurse already and declined services.  Advised patient that CM would send letter and brochure.     Plan: RN CM will send letter and close case.  [name], RN, MSN [facility] Care Management Care Management Coordinator Direct Line [phone] Toll Free: [phone]  Fax: [phone]

## 2017-12-31 DIAGNOSIS — J45909 Unspecified asthma, uncomplicated: Secondary | ICD-10-CM | POA: Diagnosis not present

## 2018-01-03 ENCOUNTER — Other Ambulatory Visit: Payer: Self-pay

## 2018-01-03 ENCOUNTER — Other Ambulatory Visit: Payer: Self-pay | Admitting: Family Medicine

## 2018-01-03 NOTE — Patient Outreach (Signed)
[facility]) Care Management  01/03/2018  Brittany Roberts 02-26-1941 [id number]   EMMI- Heart Failure RED ON EMMI ALERT Day # 18 Date: 01/02/18 Red Alert Reason: Weight 264 lbs   Outreach attempt # 1 Telephone call to patient.  No answer.  HIPAA compliant voice message left   Plan: RN CM will send letter and attempt patient again within 4 business days.    [name], RN, MSN [facility] Care Management Care Management Coordinator Direct Line [phone] Toll Free: [phone]  Fax: [phone]

## 2018-01-04 ENCOUNTER — Other Ambulatory Visit: Payer: Self-pay

## 2018-01-04 NOTE — Patient Outreach (Signed)
[facility] [facility]) Care Management  01/04/2018  Brittany Roberts [birth year] [id number]   EMMI- Heart Failure RED ON EMMI ALERT Day # 18 Date:01/02/18 Red Alert Reason: Weight 264 lbs  Outreach attempt # 2 Telephone call to patient.  No answer.  HIPAA compliant voice message left   Plan: RN CM will attempt patient again within 4 business days.    [name], RN, MSN [facility] Care Management Care Management Coordinator Direct Line [phone] Toll Free: [phone]  Fax: [phone]

## 2018-01-09 ENCOUNTER — Other Ambulatory Visit: Payer: Self-pay

## 2018-01-09 NOTE — Patient Outreach (Signed)
[facility]) Care Management  01/09/2018  Brittany Roberts [birth year] [id number]   EMMI-Heart Failure RED ON EMMI ALERT Day #18 Date:01/02/18 Red Alert Reason: Weight 264 lbs  Day #23 01/07/18 Weighed themselves today?  Outreach attempt #3 Telephone call to patient. No answer. HIPAA compliant voice message left.  Plan: RN CM will wait patient return call.  If no return call will close case.  [name], RN, MSN [facility] Care Management Care Management Coordinator Direct Line [phone] Toll Free: [phone]  Fax: [phone]

## 2018-01-10 ENCOUNTER — Ambulatory Visit: Payer: Self-pay

## 2018-01-11 DIAGNOSIS — I2510 Atherosclerotic heart disease of native coronary artery without angina pectoris: Secondary | ICD-10-CM | POA: Diagnosis not present

## 2018-01-11 DIAGNOSIS — Z9861 Coronary angioplasty status: Secondary | ICD-10-CM | POA: Diagnosis not present

## 2018-01-11 DIAGNOSIS — I5032 Chronic diastolic (congestive) heart failure: Secondary | ICD-10-CM | POA: Diagnosis not present

## 2018-01-11 DIAGNOSIS — I10 Essential (primary) hypertension: Secondary | ICD-10-CM | POA: Diagnosis not present

## 2018-01-16 ENCOUNTER — Other Ambulatory Visit: Payer: Self-pay

## 2018-01-16 NOTE — Patient Outreach (Signed)
[facility]) Care Management  01/16/2018  Brittany Roberts [birth year] [id number]   EMMI- Heart Failure     RED ON EMMI ALERT Day # 29  Date: 01/13/18 Red Alert Reason:  Weight 266 lbs  Outreach attempt # 4 Telephone call to patient.  No answer.  HIPAA compliant voice message left.   Plan: RN CM will wait patient return call.  If no return call will close case.  [name], RN, MSN [facility] Care Management Care Management Coordinator Direct Line [phone] Toll Free: [phone]  Fax: [phone]

## 2018-01-20 ENCOUNTER — Other Ambulatory Visit: Payer: Self-pay | Admitting: Family Medicine

## 2018-01-20 ENCOUNTER — Other Ambulatory Visit: Payer: Self-pay

## 2018-01-20 NOTE — Patient Outreach (Signed)
[facility]) Care Management  01/20/2018  EMMERSON SHUFFIELD 1940/08/08 [id number]   EMMI- Heart Failure RED ON EMMI ALERT Day # 35 Date: 01/19/18 Red Alert Reason: Weighed themselves? No   Outreach attempt to patient. Again no answer.  HIPAA compliant voice message left.   Plan: RN CM will close case.   [name], RN, MSN [facility] Care Management Care Management Coordinator Direct Line [phone] Toll Free: [phone]  Fax: [phone]

## 2018-01-23 ENCOUNTER — Other Ambulatory Visit: Payer: Self-pay

## 2018-01-23 NOTE — Patient Outreach (Signed)
[facility]) Care Management  01/23/2018  Brittany Roberts 25-Sep-1940 [id number]     EMMI-HF RED ON EMMI ALERT Day # 38 Date: 01/22/18 Red Alert Reason: "Weight: 267 lbs, Previous weight: 255 lbs"   Outreach attempt # 1 to patient. No answer at present. RN CM left HIPAA compliant voicemail message.        Plan: RN CM will make outreach attempt to patient within 3-4 business days.   Enzo Montgomery, RN,BSN,CCM [place] Management Telephonic Care Management Coordinator Direct Phone: [phone] Toll Free: [phone] Fax: [phone]

## 2018-01-24 ENCOUNTER — Other Ambulatory Visit: Payer: Self-pay

## 2018-01-24 NOTE — Patient Outreach (Signed)
[facility]) Care Management  01/24/2018  EMMACLAIRE SWITALA 23-Oct-1940 [id number]     EMMI-HF RED ON EMMI ALERT Day # 38 Date: 01/22/18 Red Alert Reason: "Weight: 267 lbs, Previous weight: 255 lbs"   EMMI-HF RED ON EMMI ALERT Day #39 Date: 01/23/18 Red Alert Reason:" Weight: 267 lbs"    Outreach attempt #  2 to patient. No answer at present.       Plan: RN CM will make outreach attempt to patient within 3-4 business days.    Enzo Montgomery, RN,BSN,CCM [place] Management Telephonic Care Management Coordinator Direct Phone: [phone] Toll Free: [phone] Fax: [phone]

## 2018-01-25 ENCOUNTER — Other Ambulatory Visit: Payer: Self-pay

## 2018-01-25 NOTE — Patient Outreach (Signed)
[place] [facility]) Care Management  01/25/2018  Brittany Roberts 06-21-1941 [id number]   EMMI-HF RED ON EMMI ALERT Day #38 Date:01/22/18 Red Alert Reason:"Weight: 267 lbs, Previous weight: 255 lbs"   EMMI-HF RED ON EMMI ALERT Day #39 Date: 01/23/18 Red Alert Reason:" Weight: 267 lbs"   EMMI-HF RED ON EMMI ALERT Day # 40 Date: 01/24/18 Red Alert Reason: "weight: 267 lbs"   Outreach attempt #3 to patient. No answer at present.    Plan: RN CM will close case if no response from patient.  Enzo Montgomery, RN,BSN,CCM [place] Management Telephonic Care Management Coordinator Direct Phone: [phone] Toll Free: [phone] Fax: [phone]

## 2018-01-26 ENCOUNTER — Other Ambulatory Visit: Payer: Self-pay

## 2018-01-26 ENCOUNTER — Telehealth: Payer: Self-pay | Admitting: Family Medicine

## 2018-01-26 NOTE — Patient Outreach (Signed)
[place] [facility]) Care Management  01/26/2018  JAEDAH LORDS 1941/02/16 [id number]     EMMI-HF RED ON EMMI ALERT Day # 41 Date: 01/25/18 Red Alert Reason:" weight: 267 lbs"   Outreach attempt # 1 to patient no answer. Patient continues to flag red on EMMI dashboard but unable to reach by phone to address issues.        Plan: RN CM will close case if no response from patient.    Enzo Montgomery, RN,BSN,CCM [place] Management Telephonic Care Management Coordinator Direct Phone: [phone] Toll Free: [phone] Fax: [phone]

## 2018-01-26 NOTE — Telephone Encounter (Signed)
Copied from [place] [phone]. Topic: Referral - Request >> Jan 26, 2018  1:41 PM Yvette Rack wrote: Reason for CRM: Pt states she needs a therapist because her knees are bad. Pt states the right knee gives out from time to time and the left knee is really sore when she gets into a car. Pt request referral to [facility] [phone] and if possible she would like to have Therapist [facility]. Cb# [phone]

## 2018-01-27 ENCOUNTER — Other Ambulatory Visit: Payer: Self-pay

## 2018-01-27 NOTE — Telephone Encounter (Signed)
Message sent to Dr. Jordan for review and approval. 

## 2018-01-27 NOTE — Patient Outreach (Signed)
[place]) Care Management  01/27/2018  Brittany Roberts 1940-10-26 [id number]   EMMI-HF RED ON EMMI ALERT Day # 41 Date: 01/25/18 Red Alert Reason:" weight: 267 lbs"    EMMI-HF RED ON EMMI ALERT Day # 42 Date: 01/26/18 Red Alert Reason: "Weight: 267 lbs"   Outreach attempt to patient. No answer at present.         Plan: RN CM will close case if no response from patient.   Enzo Montgomery, RN,BSN,CCM [place] Management Telephonic Care Management Coordinator Direct Phone: [phone] Toll Free: [phone] Fax: [phone]

## 2018-01-30 ENCOUNTER — Other Ambulatory Visit: Payer: Self-pay

## 2018-01-30 ENCOUNTER — Other Ambulatory Visit: Payer: Self-pay | Admitting: *Deleted

## 2018-01-30 DIAGNOSIS — M25561 Pain in right knee: Secondary | ICD-10-CM

## 2018-01-30 DIAGNOSIS — M25562 Pain in left knee: Principal | ICD-10-CM

## 2018-01-30 DIAGNOSIS — J45909 Unspecified asthma, uncomplicated: Secondary | ICD-10-CM | POA: Diagnosis not present

## 2018-01-30 NOTE — Telephone Encounter (Signed)
Spoke with patient and she stated that she would like to Ortho as advised. Referral placed for ortho.

## 2018-01-30 NOTE — Patient Outreach (Signed)
[facility] [facility]) Care Management  01/30/2018  Brittany Roberts 10-04-1940 [id number]     EMMI-HF RED ON EMMI ALERT Day # 43 Date: 01/27/18 Red Alert Reason: " Weight: 267 lbs"   Outreach attempt to patient. No answer. Patient continues to respond rd on same dashboard response but RN CM has been unsuccessful in reaching patient.         Plan: RN CM will close case if no response from patient.   Enzo Montgomery, RN,BSN,CCM [place] Management Telephonic Care Management Coordinator Direct Phone: [phone] Toll Free: [phone] Fax: [phone]

## 2018-01-30 NOTE — Telephone Encounter (Signed)
We could arrange Ortho referral instead. If she insists on having PT evaluation first, referral can be placed. Thanks, BJ

## 2018-01-31 ENCOUNTER — Other Ambulatory Visit: Payer: Self-pay

## 2018-01-31 NOTE — Patient Outreach (Signed)
[facility]) Care Management  01/31/2018  Brittany Roberts 1941/06/11 [id number]   Telephone Screen  Referral Date: 01/31/18 Referral Source: EMMI Prevent Referral Reason: " patient engagement score of 14, DM, HF, HTN, COPD" Insurance: [facility] Medicare   Outreach attempt #1 to patient. Spoke with patient and screening completed.    Social: Patient resides in her home alone. She states that her daughter who is on disability lives nearby and assists as much as she can. Patient gets aide services via Medicaid and aide comes M-F for about two hours/day. She voices that sh requires assistance with ADLs/IADLs. She relies on [facility] or Medicaid services for transportation. Patient states that she has had "multiple falls" in the home as her "legs give out" on her. She voices most recent fall was about 2-3 weeks ago in which she had to call 911 for help.    Conditions: Per chart review, patient has PMH of DM, CHF, CAD, CKD, HTN, HLD, obesity, COP and fibromyalgia. She voices that she is checking her blood sugars about once a day and cbgs normally range in the 115s-160s. Last A1C was 6.5(07/19/17). Patient voices that she weighing daily. Weight remaining stable around 266-267 lbs. However, she does report ongoing issues with swelling in her lower extremities. Patient reports she is taking diuretic as ordered and adhering to no salt diet. She voices ongoing issues with SOB at rest and with exertion. She has oxygen int he home and using it QHS mainly but has noticed that she is having to use it more during the day as well prn. Patient shares that she has CKD but does not want to "go on dialysis and be a burden even more to family."   Medications: Per patient report, she takes about 15-16 meds in the morning and about 3-4 meds in the evening. She states that her daughter fills her med planner "when she feels like it" but does not do it consistently every week. Patient admits that she forgets to take her  meds often-multiple times a week. She states that she especially forgets on the weekends when she is home alone and no one coming in to visit with her. She shares that in the past she was claimed to have "accidentally overdosed" on some of her meds but does not feel like she did.    Appointments: She is followed by PCP and states she has been referred to ortho specialist for evaluation of her knees on 02/09/18.   Advance Directives: Patient reports she has completed form but just needs to give hospital a copy of form.   Consent: [facility] services reviewed and discussed. Patient gave verbal consent for services. Patient advised of importance of answering calls as she has been unable to reach multiple times and staff will only make three phone attempts to reach her. She voiced understanding.    Plan: RN CM will send [place] referral for polypharmacy med review and med mgmt assistance. RN CM will send [facility] community nurse referral for further in home eval/assessment of care needs and mgmt of chronic conditions and symptoms.   Enzo Montgomery, RN,BSN,CCM [place] Management Telephonic Care Management Coordinator Direct Phone: [phone] Toll Free: [phone] Fax: [phone]

## 2018-02-01 ENCOUNTER — Other Ambulatory Visit: Payer: Self-pay | Admitting: *Deleted

## 2018-02-01 NOTE — Patient Outreach (Signed)
[facility]) Care Management  02/01/2018  Brittany Roberts February 20, 1941 [id number]   Referral received from telephonic care manager requesting home visit for further assessment/evaluation of needs in the home.  Per chart, member has history of hypertension, heart failure, diabetes, CVA, COPD, and hypothyroidism.  Call placed to member, no answer.  HIPAA compliant voice message left.  Will await call back.  Unsuccessful outreach letter sent, will follow up within the next 4 business days.  Valente David, [place], MSN [place] [phone]

## 2018-02-03 ENCOUNTER — Telehealth: Payer: Self-pay | Admitting: Pharmacist

## 2018-02-03 NOTE — Patient Outreach (Signed)
[place] [facility]) Care Management  02/03/2018  Brittany Roberts 1941-04-25 [id number]   Patient was called regarding medication management. HIPAA identifiers were obtained. Patient is a 77 year old female with multiple medical conditions including but not limited to:  CHF, allergic rhinitis, altered mental statu, asthma, Bipolar disorder, CAD, cardiomyopathy, CKD, CVA, fibromyalgia, hypothyroidism, type 2 diabetes, morbid obesity, osteoarthritis, sleep apnea, and schizophrenia.  Patient reported having difficulty remembering to take her medications. In addition, she also said her daughter puts her medications in her pill box and she forgets to take them. It was also apparent from the conversation that she did not know her medications by name.  Plan: Home visit scheduled for February 10, 2018.   Elayne Guerin, PharmD, [place] Clinical Pharmacist [phone]

## 2018-02-06 ENCOUNTER — Other Ambulatory Visit: Payer: Self-pay | Admitting: *Deleted

## 2018-02-06 NOTE — Patient Outreach (Signed)
[facility]) Care Management  02/06/2018  JAYRA CHOYCE [birth year] [id number]   2nd attempt made to contact member to schedule home visit for assessment of needs.  Identity verified, this care manager introduced self and purpose of call.  [facility] care management services explained.  She denies any urgent concerns, agrees to home visit next week.  Reminded of home visit from [facility] pharmacist later this week, she verbalizes understanding.  Valente David, [place], MSN [place] [phone]

## 2018-02-09 ENCOUNTER — Ambulatory Visit (INDEPENDENT_AMBULATORY_CARE_PROVIDER_SITE_OTHER): Payer: Self-pay

## 2018-02-09 ENCOUNTER — Encounter (INDEPENDENT_AMBULATORY_CARE_PROVIDER_SITE_OTHER): Payer: Self-pay | Admitting: Orthopedic Surgery

## 2018-02-09 ENCOUNTER — Ambulatory Visit (INDEPENDENT_AMBULATORY_CARE_PROVIDER_SITE_OTHER): Payer: Medicare Other | Admitting: Orthopedic Surgery

## 2018-02-09 VITALS — Ht 63.0 in | Wt 266.0 lb

## 2018-02-09 DIAGNOSIS — M25571 Pain in right ankle and joints of right foot: Secondary | ICD-10-CM | POA: Diagnosis not present

## 2018-02-09 DIAGNOSIS — G8929 Other chronic pain: Secondary | ICD-10-CM

## 2018-02-09 DIAGNOSIS — M25561 Pain in right knee: Secondary | ICD-10-CM

## 2018-02-09 DIAGNOSIS — M25562 Pain in left knee: Secondary | ICD-10-CM

## 2018-02-09 DIAGNOSIS — Z6841 Body Mass Index (BMI) 40.0 and over, adult: Secondary | ICD-10-CM | POA: Diagnosis not present

## 2018-02-09 DIAGNOSIS — M170 Bilateral primary osteoarthritis of knee: Secondary | ICD-10-CM | POA: Insufficient documentation

## 2018-02-09 NOTE — Progress Notes (Signed)
Office Visit Note   Patient: Brittany Roberts           Date of Birth: 02-07-1941           MRN: [id number] Visit Date: 02/09/2018              Requested by: [name], [name], MD [address] [place],  [address] PCP: [name], [name], MD  Chief Complaint  Patient presents with  . Right Knee - Pain  . Left Knee - Pain  . Right Ankle - Pain      HPI: Patient is a 77 year old woman who presents with osteoarthritis both knees and traumatic arthritis right ankle.  Patient is seeing Dr. Ninfa Linden in the past.  Patient complains of global pain around both knees with giving way pain worse in the right than the left knee.  She states she has had falling secondary to her knee giving out she uses a rolling walker for ambulation.  Medical history includes diabetes sleep apnea gout renal disease cardiac disease peripheral vascular disease.  Assessment & Plan: Visit Diagnoses:  1. Chronic pain of both knees   2. Pain in right ankle and joints of right foot   3. Bilateral primary osteoarthritis of knee     Plan: Patient was given a prescription for physical therapy she states she will [facility] to [facility] for her [facility].  Discussed that we could proceed with a steroid injection if she would like to pursue this.  Patient states she does not want to consider an injection at this time.  Follow-Up Instructions: Return if symptoms worsen or fail to improve.   Ortho Exam  Patient is alert, oriented, no adenopathy, well-dressed, normal affect, normal respiratory effort. Examination patient has difficulty getting from a sitting to a standing position she uses a rolling walker for ambulation.  She has global tenderness to palpation around both knees and her right ankle.  She has crepitation with range of motion of the knees.  Collaterals and cruciates are stable bilaterally.  Imaging: Xr Knee 1-2 Views Left  Result Date: 02/09/2018 2 view radiographs of the left knee shows joint space narrowing  periarticular bony spurs with varus alignment.  Xr Ankle Complete Right  Result Date: 02/09/2018 2 view radiographs of the right ankle shows stable internal fixation for the fibula fracture.  There is joint space narrowing consistent with traumatic arthritis.  Xr Knee 1-2 Views Right  Result Date: 02/09/2018 2 view radiographs of the right knee shows joint space narrowing medially with periarticular bony spurs and subcondylar sclerosis.  No images are attached to the encounter.  Labs: Lab Results  Component Value Date   HGBA1C 6.5 07/19/2017   HGBA1C 7.2 (H) 01/15/2017   HGBA1C 7.6 12/29/2016   ESRSEDRATE 50 (H) 10/29/2014   ESRSEDRATE 52 (H) 08/01/2014   CRP 7.0 (H) 10/29/2014   CRP 7.0 (H) 10/29/2014   LABURIC 10.2 (H) 07/19/2017   GRAMSTAIN No WBC Seen 03/15/2011   GRAMSTAIN No Squamous Epithelial Cells Seen 03/15/2011   GRAMSTAIN No Organisms Seen 03/15/2011   LABORGA Insignificant Growth 04/05/2014     Lab Results  Component Value Date   ALBUMIN 3.7 09/06/2017   ALBUMIN 3.6 01/13/2017   ALBUMIN 3.6 09/08/2016   LABURIC 10.2 (H) 07/19/2017    Body mass index is 47.12 kg/m.  Orders:  Orders Placed This Encounter  Procedures  . XR Knee 1-2 Views Right  . XR Knee 1-2 Views Left  . XR Ankle Complete Right  No orders of the defined types were placed in this encounter.    Procedures: No procedures performed  Clinical Data: No additional findings.  ROS:  All other systems negative, except as noted in the HPI. Review of Systems  Objective: Vital Signs: Ht 5\' 3"  (1.6 m)   Wt 266 lb (120.7 kg)   BMI 47.12 kg/m   Specialty Comments:  No specialty comments available.  PMFS History: Patient Active Problem List   Diagnosis Date Noted  . Bilateral primary osteoarthritis of knee 02/09/2018  . Pain in right ankle and joints of right foot 02/09/2018  . Acute exacerbation of congestive heart failure ([place]) 12/13/2017  . AKI (acute kidney injury) ([place])  09/01/2017  . Gout, arthropathy 09/01/2017  . Localization-related symptomatic epilepsy and epileptic syndromes with complex partial seizures, not intractable, without status epilepticus ([place]) 04/25/2017  . Fibromyalgia 04/08/2017  . Allergic rhinitis 03/11/2017  . Cardiomyopathy ([place]) 01/22/2017  . Hyperlipidemia associated with type 2 diabetes mellitus ([place]) 01/22/2017  . Bipolar disorder ([place]) 01/22/2017  . Seizure ([place])   . Thoracic aortic atherosclerosis ([place]) 01/14/2017  . Coronary artery calcification seen on CAT scan 01/14/2017  . Acute delirium 01/14/2017  . Possible Tonic-clonic seizure disorder ([place]) 01/14/2017  . CVA (cerebral vascular accident) ([place]) 01/14/2017  . Altered mental status 01/13/2017  . Mastalgia 04/20/2016  . Bilateral leg edema 02/10/2016  . Hearing difficulty 10/30/2015  . Midline low back pain with right-sided sciatica 09/25/2015  . Angina pectoris ([place]) 06/30/2015  . Dysarthria 02/14/2015  . Schizophrenia, unspecified type ([place]) 10/25/2014  . Type 2 diabetes mellitus with complication ([place])   . Cephalalgia   . Hx of systemic lupus erythematosus (SLE) ([place])   . Thyroid mass 04/26/2014  . Constipation 12/10/2013  . Benign neoplasm of colon 12/10/2013  . Controlled type 2 diabetes with neuropathy ([place]) 11/27/2013  . Cystic kidney disease 11/09/2013  . Insulin dependent type 2 diabetes mellitus ([place]) 10/29/2013  . Hoarseness or changing voice 09/27/2013  . Dyskinesia, tardive 07/11/2013  . TMJ syndrome 02/16/2013  . Angina pectoris associated with type 2 diabetes mellitus ([place]) 08/08/2012  . CAD in native artery 08/08/2012  . Asthma, chronic, PRESUMED wiht MULTIFACTORIAL DYSPNEA 05/30/2012  . Insomnia 09/22/2011  . Mobility poor 02/02/2011  . Chronic kidney disease, stage III (moderate) ([place]) 05/13/2010  . Chronic combined systolic and diastolic CHF, NYHA class 2 ([place]) 03/31/2010  . COPD (chronic obstructive pulmonary disease) ([place]) 03/26/2010  .  Morbid obesity ([place]) 02/27/2010  . Unstable gait 02/27/2010  . DM (diabetes mellitus), type 2 with neurological complications ([place]) 10/93/2355  . Osteoarthritis 04/25/2009  . Anemia 03/11/2007  . COGNITIVE IMPAIRMENT, MILD, SO STATED 03/11/2007  . RESTLESS LEG SYNDROME 03/11/2007  . Systemic lupus ([place]) 03/11/2007  . OSTEOARTHROSIS, GENERALIZED, MULTIPLE SITES 03/11/2007  . Hypothyroidism 03/07/2007  . DYSLIPIDEMIA 03/07/2007  . Depression 03/07/2007  . Essential hypertension 03/07/2007  . Coronary atherosclerosis 03/07/2007  . GERD 03/07/2007  . SLEEP APNEA 03/07/2007   Past Medical History:  Diagnosis Date  . Acute delirium 01/14/2017  . Anxiety   . Arthritis    "all over"  . Asthma   . Bipolar disorder ([place])   . Complication of anesthesia    "w/right foot OR they gave me too much and they couldn't get me woke"  . Congestive heart failure, unspecified   . Coronary artery calcification seen on CAT scan 01/14/2017  . Coronary artery disease    "I've got 1 stent" (06/30/2015)  . Depression   .  Discoid lupus   . Fibromyalgia    "I've been told I have this; can't take Lyrica cause I'm allergic to it" (06/30/2015)  . GERD (gastroesophageal reflux disease)   . History of gout   . History of hiatal hernia    "real bad" (06/30/2015)  . Hypertension   . Hypothyroidism   . On home oxygen therapy    "2L q hs" (06/30/2015)  . OSA (obstructive sleep apnea)    "just use my oxygen; no mask" (06/30/2015)  . Other and unspecified hyperlipidemia   . Penetrating foot wound    left nonhealing foot wound on the dorsal surface  . Peripheral neuropathy   . Pneumonia "many of times"  . Refusal of blood transfusions as patient is Jehovah's Witness   . Renal failure, unspecified    "kidneys not working 100%" (06/30/2015)  . Sickle cell trait ([place])   . Systemic lupus ([place])   . Thoracic aortic atherosclerosis ([place]) 01/14/2017  . Type II diabetes mellitus ([place])   . Unspecified vitamin D  deficiency     Family History  Problem Relation Age of Onset  . Heart disease Mother   . Diabetes Mother   . Clotting disorder Mother   . Pneumonia Father   . Rheum arthritis Father   . Diabetes Sister   . Diabetes Sister   . Asthma Brother   . Cancer Brother   . Kidney disease Brother   . Lupus Son   . Heart disease Son   . Heart disease Daughter   . Colon cancer Neg Hx     Past Surgical History:  Procedure Laterality Date  . ANKLE ARTHROSCOPY WITH OPEN REDUCTION INTERNAL FIXATION (ORIF) Right 03/2011  . CARDIAC CATHETERIZATION N/A 07/01/2015   Procedure: Left Heart Cath and Coronary Angiography;  Surgeon: Adrian Prows, MD;  Location: [facility];  Service: Cardiovascular;  Laterality: N/A;  . CARDIAC CATHETERIZATION N/A 07/01/2015   Procedure: Intravascular Pressure Wire/FFR Study;  Surgeon: Adrian Prows, MD;  Location: [facility];  Service: Cardiovascular;  Laterality: N/A;  . CARPAL TUNNEL RELEASE Bilateral   . CATARACT EXTRACTION W/ INTRAOCULAR LENS  IMPLANT, BILATERAL Bilateral   . CHOLECYSTECTOMY OPEN    . COLONOSCOPY N/A 12/10/2013   Procedure: COLONOSCOPY;  Surgeon: Inda Castle, MD;  Location: [facility];  Service: Endoscopy;  Laterality: N/A;  . FRACTURE SURGERY    . INCISION AND DRAINAGE ABSCESS Left    foot "under my little toe"  . KNEE LIGAMENT RECONSTRUCTION Right   . LEFT HEART CATHETERIZATION WITH CORONARY ANGIOGRAM N/A 08/08/2012   Procedure: LEFT HEART CATHETERIZATION WITH CORONARY ANGIOGRAM;  Surgeon: [name], MD;  Location: [facility] CATH LAB;  Service: Cardiovascular;  Laterality: N/A;  . NASAL SINUS SURGERY    . PERCUTANEOUS CORONARY STENT INTERVENTION (PCI-S) N/A 08/21/2012   Procedure: PERCUTANEOUS CORONARY STENT INTERVENTION (PCI-S);  Surgeon: [name], MD;  Location: [facility] CATH LAB;  Service: Cardiovascular;  Laterality: N/A;  . TUBAL LIGATION     Social History   Occupational History  . Occupation: Retired- Water engineer    Employer: RETIRED  Tobacco Use  . Smoking status: Former Smoker    Packs/day: 3.00    Years: 40.00    Pack years: 120.00    Types: Cigarettes    Start date: 07/05/1960    Last attempt to quit: 07/05/1998    Years since quitting: 19.6  . Smokeless tobacco: Never Used  Substance and Sexual Activity  .  Alcohol use: No    Alcohol/week: 0.0 standard drinks    Comment: 06/30/2015 "used to drink a little alcohol on the weekends; not q weekend; nothing in years"  . Drug use: No  . Sexual activity: Never

## 2018-02-10 ENCOUNTER — Other Ambulatory Visit: Payer: Self-pay | Admitting: Pharmacist

## 2018-02-10 NOTE — Patient Outreach (Addendum)
[place] [facility]) Care Management  [place]   02/10/2018  Brittany Roberts Jun 20, 1941 [id number]  Subjective: Home visit completed at patient's home. Patient's daughter [name]) and [place] Resident Catie Darnelle Maffucci, PharmD were present for the visit. HIPAA identifiers were obtained x 2.  Patient is a 77 year old female with multiple medical conditions including but not limited to:  Allergic rhinitis, anemia, asthma, bipolar disorder, CHF, COPD, type 2 diabetes GERD hypothyroidism, insomnia, low mobility/unstable gait, osteoarthritis RLS, sleep apnea, and CKD.  Patient's daughter manages her medications and uses a weekly pill box. Patient said she did NOT want to use pill packing because it confuses her.  Patient did say she would be interested in something to help her remember to take her medications. Her daughter said she tries to call her and remind her most days.  Objective: HgA1c 6.5 1/19 LDL- 38 (2016)  eGFR-38 ml/min Scr 1.47  Blood glucose reading averages from patient's meter:  188- 7day 176-14 day 164-30 day  Encounter Medications: Outpatient Encounter Medications as of 02/10/2018  Medication Sig  . ACCU-CHEK AVIVA PLUS test strip TEST three times a day  . acetaminophen (TYLENOL) 325 MG tablet Take 650 mg by mouth every 6 (six) hours as needed for mild pain or moderate pain.  Marland Kitchen albuterol (PROAIR HFA) 108 (90 Base) MCG/ACT inhaler inhale 2 puffs by mouth every 4 hours if needed USE ONLY IF YOU ARE WHEEZING  . amLODipine (NORVASC) 10 MG tablet Take 1 tablet (10 mg total) by mouth daily.  . Ascorbic Acid (VITAMIN C PO) Take 1 tablet by mouth daily.  Marland Kitchen aspirin EC 81 MG tablet Take 81 mg by mouth daily.   Marland Kitchen atorvastatin (LIPITOR) 20 MG tablet Take 1 tablet (20 mg total) by mouth daily.  . B Complex Vitamins (VITAMIN B COMPLEX) TABS Take 1 tablet by mouth daily.  . Blood Glucose Monitoring Suppl (ACCU-CHEK AVIVA) device Use to test blood sugar three times a day.  Dx. E11.9  . Cholecalciferol (VITAMIN D3) 5000 UNITS CAPS Take 5,000 Units by mouth daily.  . Flaxseed, Linseed, (FLAXSEED OIL PO) Take 1 capsule by mouth daily.  . furosemide (LASIX) 40 MG tablet Take 1 tablet (40 mg total) by mouth daily. Take am and 3 pm until 12/19/2017  (Twice daily for 3 more days) then once a day  . gabapentin (NEURONTIN) 300 MG capsule Take 1 capsule (300 mg total) by mouth 2 (two) times daily.  Marland Kitchen guaiFENesin (ROBITUSSIN) 100 MG/5ML liquid Take 200 mg by mouth 3 (three) times daily as needed for cough.  . Insulin NPH, Human,, Isophane, (HUMULIN N KWIKPEN) 100 UNIT/ML Kiwkpen INJECT 30 UNITS SUBCUTANEOUSLY EVERY MORNING  . Insulin Pen Needle (PEN NEEDLES 31GX5/16") 31G X 8 MM MISC Check Blood sugar three times per day. Dx insulin using E11.8  . levETIRAcetam (KEPPRA) 500 MG tablet Take 1 tablet (500 mg total) by mouth 2 (two) times daily.  . metoprolol succinate (TOPROL-XL) 50 MG 24 hr tablet Take 50 mg by mouth daily. Take with or immediately following a meal.  . Multiple Vitamin (MULTIVITAMIN WITH MINERALS) TABS tablet Take 1 tablet by mouth daily.  . nitroGLYCERIN (NITROSTAT) 0.4 MG SL tablet Place 1 tablet (0.4 mg total) under the tongue every 5 (five) minutes as needed for chest pain.  Marland Kitchen Olopatadine HCl (PAZEO) 0.7 % SOLN Apply 1 drop to eye daily.  . pantoprazole (PROTONIX) 40 MG tablet Take 1 tablet (40 mg total) by mouth daily.  Marland Kitchen  Polyethyl Glycol-Propyl Glycol (SYSTANE ULTRA OP) Place 1 drop into both eyes every 6 (six) hours as needed (dry eyes).  . potassium chloride (K-DUR,KLOR-CON) 10 MEQ tablet Take 1 tablet by mouth 2 (two) times daily.  Marland Kitchen allopurinol (ZYLOPRIM) 100 MG tablet Take 1 tablet (100 mg total) by mouth daily. (Patient not taking: Reported on 02/10/2018)  . colchicine 0.6 MG tablet TAKE ONE TABLET BY MOUTH TWICE A DAILY FOR 1 WEEK, THE AS NEEDED 2 TABLETS AT ONCE WITH GOUT ONSET; MAX OF 2 PER DAY (Patient not taking: Reported on 02/10/2018)  . montelukast  (SINGULAIR) 10 MG tablet Take 1 tablet (10 mg total) by mouth at bedtime. (Patient not taking: Reported on 02/10/2018)  . [DISCONTINUED] atorvastatin (LIPITOR) 20 MG tablet TAKE 1 TABLET BY MOUTH ONCE DAILY   No facility-administered encounter medications on file as of 02/10/2018.     Functional Status: In your present state of health, do you have any difficulty performing the following activities: 12/13/2017 09/01/2017  Hearing? Tempie Donning  Vision? N N  Difficulty concentrating or making decisions? N Y  Walking or climbing stairs? Y Y  Dressing or bathing? N Y  Doing errands, shopping? Y Y  Some recent data might be hidden    Fall/Depression Screening: Fall Risk  01/31/2018 10/27/2017 04/25/2017  Falls in the past year? Yes Yes Yes  Number falls in past yr: 2 or more 2 or more 1  Injury with Fall? No Yes No  Risk Factor Category  High Fall Risk High Fall Risk -  Risk for fall due to : History of fall(s);Impaired balance/gait;Medication side effect;Impaired mobility;Impaired vision - -  Risk for fall due to: Comment - - -  Follow up Falls evaluation completed;Falls prevention discussed;Education provided - -   PHQ 2/9 Scores 01/31/2018 12/29/2016 12/23/2016 10/15/2016 05/19/2016 04/01/2016 03/17/2016  PHQ - 2 Score 0 6 0 0 0 1 0  PHQ- 9 Score - - - - - - -    Assessment: Patient's medications were reviewed in her home.  The actual pill box was inspected:   Drugs sorted by system:  Neurologic/Psychologic: Gabapentin,  Levetiracetam,   Cardiovascular: Amlodipine, Aspirin, Furosemide, Atorvastatin, Metoprolol, Nitroglycerin  Pulmonary/Allergy: ProAir HFA, Guaifenesin, Montelukast (patient reported not taking this due to mental status changes)  Gastrointestinal: Pantoprazole, Polyethylene-Glycol   Endocrine: Humulin NPH,    Renal: Allopurinol, Colchicine (patient reported not taking either of these)  Pain: Acetaminophen  Vitamins/Minerals: Ascorbic Acid, B Complex Vitamin,  Cholecalciferol, Flaxseed, Potassium Chloride, ferrous sulfate  Miscellaneous: Olopatadine, Systane,    Medication Review Findings:  Adherence-patient is not taking the following medications: Allopurinol, colchicine, (takes these PRN) montelukast (patient's daughter discontinued this due to mental status changes)  Discussed pill packing, patient said she is not interested in pill packing.   Discussed the "med minder" clock/reminders. [facility] is currently out of stock of med minders.   There is an open-box reminder in Catie's office. Catie and I will investigate the med minder in her office and make sure  it is still working.  I also spoke with [facility] Management about reordering the med-minders if possible.  If we can get the one in Catie's office operational, another home visit will be scheduled with the patient and her daughter to set it up.  Therapeutic Lab Monitoring- Recent CMP, Lipids, and HgA1c were not found in the patient's chart. These may need to be ordered unless listed in a non-[facility] record.  Plan: -Coordinate with Catie to investigate the med minder in  her office -Route note to patient's PCP -follow up with patient and her daughter in 5-7 business days   Elayne Guerin, PharmD, [place] Clinical Pharmacist [phone]

## 2018-02-12 ENCOUNTER — Other Ambulatory Visit: Payer: Self-pay | Admitting: Family Medicine

## 2018-02-13 ENCOUNTER — Other Ambulatory Visit: Payer: Self-pay | Admitting: *Deleted

## 2018-02-13 ENCOUNTER — Encounter: Payer: Self-pay | Admitting: *Deleted

## 2018-02-13 ENCOUNTER — Telehealth (INDEPENDENT_AMBULATORY_CARE_PROVIDER_SITE_OTHER): Payer: Self-pay | Admitting: Orthopedic Surgery

## 2018-02-13 ENCOUNTER — Encounter: Payer: Self-pay | Admitting: Pharmacist

## 2018-02-13 DIAGNOSIS — M25562 Pain in left knee: Principal | ICD-10-CM

## 2018-02-13 DIAGNOSIS — M25561 Pain in right knee: Principal | ICD-10-CM

## 2018-02-13 DIAGNOSIS — G8929 Other chronic pain: Secondary | ICD-10-CM

## 2018-02-13 DIAGNOSIS — M170 Bilateral primary osteoarthritis of knee: Secondary | ICD-10-CM

## 2018-02-13 DIAGNOSIS — M25571 Pain in right ankle and joints of right foot: Secondary | ICD-10-CM

## 2018-02-13 NOTE — Patient Outreach (Signed)
[facility]) Care Management   02/13/2018  AAMIRA BISCHOFF May 24, 1941 [id number]  PURVA VESSELL is an 77 y.o. female  Subjective:   Member alert and oriented x3, reports generalized pain but relieved with pain medications.  State she is compliant with medications (visit by [facility] pharmacist on 8/9, medications reviewed).   Objective:   Review of Systems  Constitutional: Negative.   HENT: Negative.   Eyes: Negative.   Respiratory: Negative.   Cardiovascular: Negative.   Gastrointestinal: Negative.   Genitourinary: Negative.   Musculoskeletal: Positive for falls.  Skin: Negative.   Neurological: Negative.   Endo/Heme/Allergies: Negative.   Psychiatric/Behavioral: Negative.     Physical Exam  Constitutional: She is oriented to person, place, and time. She appears well-developed and well-nourished.  Neck: Normal range of motion.  Cardiovascular: Normal rate, regular rhythm and normal heart sounds.  Respiratory: Effort normal and breath sounds normal.  GI: Soft. Bowel sounds are normal.  Musculoskeletal: Normal range of motion.  Neurological: She is alert and oriented to person, place, and time.  Skin: Skin is warm and dry.   BP 110/65 (BP Location: Left Wrist, Patient Position: Sitting, Cuff Size: Normal)   Pulse (!) 54   Resp 20   Ht 1.6 m (_0 )   Wt 266 lb (120.7 kg)   SpO2 95%   BMI 47.12 kg/m   Encounter Medications:   Outpatient Encounter Medications as of 02/13/2018  Medication Sig  . ACCU-CHEK AVIVA PLUS test strip TEST three times a day  . acetaminophen (TYLENOL) 325 MG tablet Take 650 mg by mouth every 6 (six) hours as needed for mild pain or moderate pain.  Marland Kitchen albuterol (PROAIR HFA) 108 (90 Base) MCG/ACT inhaler inhale 2 puffs by mouth every 4 hours if needed USE ONLY IF YOU ARE WHEEZING  . allopurinol (ZYLOPRIM) 100 MG tablet Take 1 tablet (100 mg total) by mouth daily. (Patient not taking: Reported on 02/10/2018)  . amLODipine (NORVASC) 10 MG tablet  Take 1 tablet (10 mg total) by mouth daily.  . Ascorbic Acid (VITAMIN C PO) Take 1 tablet by mouth daily.  Marland Kitchen aspirin EC 81 MG tablet Take 81 mg by mouth daily.   Marland Kitchen atorvastatin (LIPITOR) 20 MG tablet Take 1 tablet (20 mg total) by mouth daily.  . B Complex Vitamins (VITAMIN B COMPLEX) TABS Take 1 tablet by mouth daily.  . Blood Glucose Monitoring Suppl (ACCU-CHEK AVIVA) device Use to test blood sugar three times a day. Dx. E11.9  . Cholecalciferol (VITAMIN D3) 5000 UNITS CAPS Take 5,000 Units by mouth daily.  . colchicine 0.6 MG tablet TAKE ONE TABLET BY MOUTH TWICE A DAILY FOR 1 WEEK, THE AS NEEDED 2 TABLETS AT ONCE WITH GOUT ONSET; MAX OF 2 PER DAY (Patient not taking: Reported on 02/10/2018)  . ferrous sulfate 325 (65 FE) MG tablet Take 325 mg by mouth daily with breakfast.  . Flaxseed, Linseed, (FLAXSEED OIL PO) Take 1 capsule by mouth daily.  . furosemide (LASIX) 40 MG tablet Take 1 tablet (40 mg total) by mouth daily. Take am and 3 pm until 12/19/2017  (Twice daily for 3 more days) then once a day  . gabapentin (NEURONTIN) 300 MG capsule Take 1 capsule (300 mg total) by mouth 2 (two) times daily.  Marland Kitchen guaiFENesin (ROBITUSSIN) 100 MG/5ML liquid Take 200 mg by mouth 3 (three) times daily as needed for cough.  . Insulin NPH, Human,, Isophane, (HUMULIN N KWIKPEN) 100 UNIT/ML Kiwkpen INJECT 30 UNITS SUBCUTANEOUSLY  EVERY MORNING  . Insulin Pen Needle (PEN NEEDLES 31GX5/16") 31G X 8 MM MISC Check Blood sugar three times per day. Dx insulin using E11.8  . levETIRAcetam (KEPPRA) 500 MG tablet Take 1 tablet (500 mg total) by mouth 2 (two) times daily.  . metoprolol succinate (TOPROL-XL) 50 MG 24 hr tablet Take 50 mg by mouth daily. Take with or immediately following a meal.  . montelukast (SINGULAIR) 10 MG tablet Take 1 tablet (10 mg total) by mouth at bedtime. (Patient not taking: Reported on 02/10/2018)  . Multiple Vitamin (MULTIVITAMIN WITH MINERALS) TABS tablet Take 1 tablet by mouth daily.  .  nitroGLYCERIN (NITROSTAT) 0.4 MG SL tablet Place 1 tablet (0.4 mg total) under the tongue every 5 (five) minutes as needed for chest pain.  Marland Kitchen Olopatadine HCl (PAZEO) 0.7 % SOLN Apply 1 drop to eye daily.  . pantoprazole (PROTONIX) 40 MG tablet Take 1 tablet (40 mg total) by mouth daily.  Vladimir Faster Glycol-Propyl Glycol (SYSTANE ULTRA OP) Place 1 drop into both eyes every 6 (six) hours as needed (dry eyes).  . potassium chloride (K-DUR,KLOR-CON) 10 MEQ tablet Take 1 tablet by mouth 2 (two) times daily.   No facility-administered encounter medications on file as of 02/13/2018.     Functional Status:   In your present state of health, do you have any difficulty performing the following activities: 02/13/2018 12/13/2017  Hearing? Tempie Donning  Vision? Y N  Difficulty concentrating or making decisions? Y N  Walking or climbing stairs? Y Y  Dressing or bathing? Y N  Doing errands, shopping? Tempie Donning  Preparing Food and eating ? Y -  Using the Toilet? Y -  In the past six months, have you accidently leaked urine? Y -  Do you have problems with loss of bowel control? Y -  Managing your Medications? Y -  Managing your Finances? N -  Housekeeping or managing your Housekeeping? Y -  Some recent data might be hidden    Fall/Depression Screening:    Fall Risk  01/31/2018 10/27/2017 04/25/2017  Falls in the past year? Yes Yes Yes  Number falls in past yr: 2 or more 2 or more 1  Injury with Fall? No Yes No  Risk Factor Category  High Fall Risk High Fall Risk -  Risk for fall due to : History of fall(s);Impaired balance/gait;Medication side effect;Impaired mobility;Impaired vision - -  Risk for fall due to: Comment - - -  Follow up Falls evaluation completed;Falls prevention discussed;Education provided - -   PHQ 2/9 Scores 01/31/2018 12/29/2016 12/23/2016 10/15/2016 05/19/2016 04/01/2016 03/17/2016  PHQ - 2 Score 0 6 0 0 0 1 0  PHQ- 9 Score - - - - - - -    Assessment:    Met with member at scheduled time.   Assessment complete, no distress noted.  She has history of hypertension, diabetes, and heart failure.  Has all equipment in the home for daily monitoring. Reviewed logs for monitoring located in [facility] calendar book.  She was seen by orthopedic MD for arthritis, given paper prescription for home health PT.  She prefer [facility], however they need order directly from MD office.  Office aware and will send referral directly to home health agency.  Member has home aide Monday-Friday as well as the assistance from daughter.  She denies any urgent concerns.  Provided with contact information for this care manager, advised to contact with questions.  Plan:   Will follow up within the next 2  weeks regarding PT. Will follow up with home visit next month.  [facility] CM Care Plan Problem One     Most Recent Value  Care Plan Problem One  Risk for fall as evidenced by recent falls  Role Documenting the Problem One  Care Management [place] for Problem One  Active  [facility] Long Term Goal   Member will not have any falls within the next 31 days  [facility] Long Term Goal Start Date  02/13/18  Interventions for Problem One Long Term Goal  Educated on fall prevention and interventions to decrease fall (ex. using walker).  Educated on use of 24 hour nurse triage line.  [facility] CM Short Term Goal #1   Member will have life alert system set up within the next 4 weeks  [facility] CM Short Term Goal #1 Start Date  02/13/18  Interventions for Short Term Goal #1  Call placed to insurance company to order system for member.  Advised to have daughter set up and send emergency contact information back to company  [facility] CM Short Term Goal #2   Member will have home health PT active within the next 4 weeks  [facility] CM Short Term Goal #2 Start Date  02/13/18  Interventions for Short Term Goal #2  Call placed to MD office to request order be placed to [facility] for PT.     Valente David, [place], MSN [place] [phone]

## 2018-02-13 NOTE — Telephone Encounter (Signed)
Brittany Roberts with [place] called on behalf of patient to see if we could send over referral to [facility] in [place] for physical therapy. She only knew their telephone # [phone].   She states patient only received paper referral in the office but they need it directly sent to them from Dr. Sharol Given.   Patients # [id number]

## 2018-02-15 ENCOUNTER — Other Ambulatory Visit: Payer: Self-pay | Admitting: Pharmacist

## 2018-02-15 ENCOUNTER — Ambulatory Visit: Payer: Self-pay | Admitting: Pharmacist

## 2018-02-15 NOTE — Patient Outreach (Signed)
[facility]) Care Management  02/15/2018  Brittany Roberts 06/09/1941 [id number]   Patient was called to follow up on getting her a med minder. HIPAA identifiers were obtained. A home visit was scheduled to review the med minder with the patient Friday at 11:00am.   Elayne Guerin, PharmD, [place] Clinical Pharmacist [phone]

## 2018-02-16 NOTE — Telephone Encounter (Signed)
Yes, set up HHPT

## 2018-02-16 NOTE — Telephone Encounter (Signed)
Order entered in epic and faxed to [facility] [phone]

## 2018-02-16 NOTE — Telephone Encounter (Signed)
Please advise on PT Rx?

## 2018-02-17 ENCOUNTER — Telehealth (INDEPENDENT_AMBULATORY_CARE_PROVIDER_SITE_OTHER): Payer: Self-pay | Admitting: Orthopedic Surgery

## 2018-02-17 ENCOUNTER — Other Ambulatory Visit: Payer: Self-pay | Admitting: Family Medicine

## 2018-02-17 ENCOUNTER — Other Ambulatory Visit: Payer: Self-pay | Admitting: Pharmacist

## 2018-02-17 NOTE — Telephone Encounter (Signed)
Patient need to schedule an ov for more refills. 

## 2018-02-17 NOTE — Telephone Encounter (Signed)
Called verbal ok to start therapy next week.

## 2018-02-17 NOTE — Telephone Encounter (Signed)
[place] [phone]    [name] called from [facility], wanted to advise Dr.Duda that the office is  unable to treat patient until next Thursday or Friday. [facility] would like to know if treatment can wait until next week?

## 2018-02-17 NOTE — Patient Outreach (Signed)
[facility]) Care Management  02/17/2018  SHANTALE HOLTMEYER [birth year] [id number]   Brief follow-up home visit was completed at the patient's home to set up the "Your Minder" medication alarm.     Patient and her daughter was shown how to operate the system and were given written instructions for trouble shooting and future use.  Two alarms were set for the patient as she takes her medications twice a day (9am and 7pm).  Patient and her daughter demonstrated understanding.  Plan: Follow up with the patient in 10-14 days to see how she is doing with the system.   Elayne Guerin, PharmD, [place] Clinical Pharmacist [phone]

## 2018-02-19 ENCOUNTER — Encounter: Payer: Self-pay | Admitting: *Deleted

## 2018-02-19 ENCOUNTER — Other Ambulatory Visit: Payer: Self-pay | Admitting: Family Medicine

## 2018-02-23 ENCOUNTER — Telehealth (INDEPENDENT_AMBULATORY_CARE_PROVIDER_SITE_OTHER): Payer: Self-pay | Admitting: Orthopedic Surgery

## 2018-02-23 NOTE — Telephone Encounter (Signed)
I called and sw pt to advise of message below. She states tht she will call physical therapy herself and to make arrangements for a time.

## 2018-02-23 NOTE — Telephone Encounter (Signed)
I called and sw Brittany Roberts with [facility] to check the status of the HHPT referral. Brittany Roberts advised that the pt is sch with [name] as she requested and will call back to advise when he is going out to see the pt. Will hold message pending return call.

## 2018-02-23 NOTE — Telephone Encounter (Signed)
Patient called advised she wanted to do her (HHPT) with [facility].  The ph# [phone]  Ermelinda Das (Physical Therapist) Patient said the therapist have come to her home before.  The number to contact patient is [phone]

## 2018-02-23 NOTE — Telephone Encounter (Signed)
[name] called back, you were unavailable but she was just letting you know Tommi Rumps went to patients home today and she was not there, he will try again tomorrow. If you have any further questions, Sara's # [phone]

## 2018-02-27 ENCOUNTER — Other Ambulatory Visit: Payer: Self-pay | Admitting: *Deleted

## 2018-02-27 ENCOUNTER — Other Ambulatory Visit: Payer: Self-pay | Admitting: Family Medicine

## 2018-02-27 NOTE — Patient Outreach (Signed)
[facility]) Care Management  02/27/2018  AILEA RHATIGAN 14-Feb-1941 [id number]   Call placed to member to follow up on engagement with PT.  No answer, unable to leave a message.  Will send unsuccessful outreach letter and follow up within the next 4 business days.  Valente David, [place], MSN [place] [phone]

## 2018-02-28 ENCOUNTER — Other Ambulatory Visit: Payer: Self-pay | Admitting: Pharmacist

## 2018-02-28 ENCOUNTER — Ambulatory Visit: Payer: Self-pay | Admitting: Pharmacist

## 2018-02-28 NOTE — Patient Outreach (Signed)
[place] [facility]) Care Management  02/28/2018  Brittany Roberts October 01, 1940 [id number]   Patient was called to follow up on the med minder that she was given. Unfortunately, she did not answer the phone. The number listed in her profile did not have a voicemail. Her daughter, [name] answered the phone, provided HIPAA identifiers and said the patient was doing well with the "med minder".  Plan: Close the patient's pharmacy case. Alert [facility] Community Nurse.   Elayne Guerin, PharmD, [place] Clinical Pharmacist [phone]

## 2018-03-02 DIAGNOSIS — J45909 Unspecified asthma, uncomplicated: Secondary | ICD-10-CM | POA: Diagnosis not present

## 2018-03-03 ENCOUNTER — Other Ambulatory Visit: Payer: Self-pay

## 2018-03-03 NOTE — Patient Outreach (Signed)
[place] [facility]) Care Management  03/03/2018  Brittany Roberts 1940/12/31 [id number]   RNCM called to follow up regarding engagement of home health physical therapy. RNCM spoke with [name] who states the home health physical therapy has not started. She reports she is waiting to hear from [place] care.  RNCM called [facility], spoke with office staff who reports that they have received an order for home health. She reports she is going to call the physical therapist and call RNCM back with follow up regarding start of care date.  Plan: await return call.  Thea Silversmith, RN, MSN, [place] Coordinator Cell: [phone]

## 2018-03-03 NOTE — Patient Outreach (Signed)
[facility]) Care Management  03/03/2018  Brittany Roberts 18-Mar-1941 [id number]   RNCM received return call from [place] at [facility]([phone]). She reports that the physical therapist will start on Monday and that she is calling to notify [name].  Plan: update assigned RNCM  Covering RNCM: Thea Silversmith, RN, MSN, [place] Coordinator Cell: [phone]

## 2018-03-06 DIAGNOSIS — M25562 Pain in left knee: Secondary | ICD-10-CM | POA: Diagnosis not present

## 2018-03-06 DIAGNOSIS — N183 Chronic kidney disease, stage 3 (moderate): Secondary | ICD-10-CM | POA: Diagnosis not present

## 2018-03-06 DIAGNOSIS — I10 Essential (primary) hypertension: Secondary | ICD-10-CM | POA: Diagnosis not present

## 2018-03-06 DIAGNOSIS — M6281 Muscle weakness (generalized): Secondary | ICD-10-CM | POA: Diagnosis not present

## 2018-03-06 DIAGNOSIS — E119 Type 2 diabetes mellitus without complications: Secondary | ICD-10-CM | POA: Diagnosis not present

## 2018-03-06 DIAGNOSIS — M170 Bilateral primary osteoarthritis of knee: Secondary | ICD-10-CM | POA: Diagnosis not present

## 2018-03-06 DIAGNOSIS — M25561 Pain in right knee: Secondary | ICD-10-CM | POA: Diagnosis not present

## 2018-03-06 DIAGNOSIS — J449 Chronic obstructive pulmonary disease, unspecified: Secondary | ICD-10-CM | POA: Diagnosis not present

## 2018-03-06 DIAGNOSIS — R269 Unspecified abnormalities of gait and mobility: Secondary | ICD-10-CM | POA: Diagnosis not present

## 2018-03-08 DIAGNOSIS — N183 Chronic kidney disease, stage 3 (moderate): Secondary | ICD-10-CM | POA: Diagnosis not present

## 2018-03-08 DIAGNOSIS — M25562 Pain in left knee: Secondary | ICD-10-CM | POA: Diagnosis not present

## 2018-03-08 DIAGNOSIS — M25561 Pain in right knee: Secondary | ICD-10-CM | POA: Diagnosis not present

## 2018-03-08 DIAGNOSIS — I10 Essential (primary) hypertension: Secondary | ICD-10-CM | POA: Diagnosis not present

## 2018-03-08 DIAGNOSIS — M170 Bilateral primary osteoarthritis of knee: Secondary | ICD-10-CM | POA: Diagnosis not present

## 2018-03-08 DIAGNOSIS — E119 Type 2 diabetes mellitus without complications: Secondary | ICD-10-CM | POA: Diagnosis not present

## 2018-03-08 DIAGNOSIS — M6281 Muscle weakness (generalized): Secondary | ICD-10-CM | POA: Diagnosis not present

## 2018-03-08 DIAGNOSIS — J449 Chronic obstructive pulmonary disease, unspecified: Secondary | ICD-10-CM | POA: Diagnosis not present

## 2018-03-08 DIAGNOSIS — R269 Unspecified abnormalities of gait and mobility: Secondary | ICD-10-CM | POA: Diagnosis not present

## 2018-03-12 ENCOUNTER — Other Ambulatory Visit: Payer: Self-pay | Admitting: Family Medicine

## 2018-03-12 DIAGNOSIS — I10 Essential (primary) hypertension: Secondary | ICD-10-CM

## 2018-03-13 ENCOUNTER — Other Ambulatory Visit: Payer: Self-pay | Admitting: *Deleted

## 2018-03-13 NOTE — Patient Outreach (Signed)
[facility]) Care Management   03/13/2018  KARIYA LAVERGNE 09-12-1940 [id number]  KAORU BENDA is an 77 y.o. female  Subjective:   Member alert and oriented x3, complains of pain in right arm/shoulder.  Report she continues to work with PT, has a new agency with personal care assistance that started a couple weeks ago.  She is happy with both of their services.  Report compliance with medications, using medication alarm, no problems.    Objective:   Review of Systems  Constitutional: Negative.   HENT: Negative.   Eyes: Negative.   Respiratory: Negative.   Cardiovascular: Negative.   Gastrointestinal: Negative.   Genitourinary: Negative.   Musculoskeletal: Positive for joint pain.  Skin: Negative.   Neurological: Negative.   Endo/Heme/Allergies: Negative.   Psychiatric/Behavioral: Negative.     Physical Exam  Constitutional: She is oriented to person, place, and time. She appears well-developed and well-nourished.  Neck: Normal range of motion.  Cardiovascular: Normal rate, regular rhythm and normal heart sounds.  Respiratory: Effort normal and breath sounds normal.  GI: Soft. Bowel sounds are normal.  Neurological: She is alert and oriented to person, place, and time.  Skin: Skin is warm and dry.   BP 125/60 (BP Location: Left Wrist, Patient Position: Sitting, Cuff Size: Normal)   Pulse (!) 52   Resp 20   SpO2 98%   Encounter Medications:   Outpatient Encounter Medications as of 03/13/2018  Medication Sig  . ACCU-CHEK AVIVA PLUS test strip TEST THREE TIMES A DAY  . acetaminophen (TYLENOL) 325 MG tablet Take 650 mg by mouth every 6 (six) hours as needed for mild pain or moderate pain.  Marland Kitchen albuterol (PROAIR HFA) 108 (90 Base) MCG/ACT inhaler inhale 2 puffs by mouth every 4 hours if needed USE ONLY IF YOU ARE WHEEZING  . allopurinol (ZYLOPRIM) 100 MG tablet Take 1 tablet (100 mg total) by mouth daily. (Patient not taking: Reported on 02/17/2018)  . amLODipine  (NORVASC) 10 MG tablet TAKE 1 TABLET BY MOUTH ONCE DAILY  . Ascorbic Acid (VITAMIN C PO) Take 1 tablet by mouth daily.  Marland Kitchen aspirin EC 81 MG tablet Take 81 mg by mouth daily.   Marland Kitchen atorvastatin (LIPITOR) 20 MG tablet TAKE 1 TABLET BY MOUTH DAILY  . B Complex Vitamins (VITAMIN B COMPLEX) TABS Take 1 tablet by mouth daily.  . Blood Glucose Monitoring Suppl (ACCU-CHEK AVIVA) device Use to test blood sugar three times a day. Dx. E11.9  . Cholecalciferol (VITAMIN D3) 5000 UNITS CAPS Take 5,000 Units by mouth daily.  . colchicine 0.6 MG tablet TAKE ONE TABLET BY MOUTH TWICE A DAILY FOR 1 WEEK, THE AS NEEDED 2 TABLETS AT ONCE WITH GOUT ONSET; MAX OF 2 PER DAY (Patient not taking: Reported on 02/17/2018)  . ferrous sulfate 325 (65 FE) MG tablet Take 325 mg by mouth daily with breakfast.  . Flaxseed, Linseed, (FLAXSEED OIL PO) Take 1 capsule by mouth daily.  . furosemide (LASIX) 40 MG tablet Take 1 tablet (40 mg total) by mouth daily. Take am and 3 pm until 12/19/2017  (Twice daily for 3 more days) then once a day  . furosemide (LASIX) 40 MG tablet TAKE 1 TABLET BY MOUTH ONCE DAILY  . gabapentin (NEURONTIN) 300 MG capsule Take 1 capsule (300 mg total) by mouth 2 (two) times daily.  Marland Kitchen guaiFENesin (ROBITUSSIN) 100 MG/5ML liquid Take 200 mg by mouth 3 (three) times daily as needed for cough.  . Insulin NPH, Human,, Isophane, (  HUMULIN N KWIKPEN) 100 UNIT/ML Kiwkpen INJECT 30 UNITS SUBCUTANEOUSLY EVERY MORNING  . Insulin Pen Needle (PEN NEEDLES 31GX5/16") 31G X 8 MM MISC Check Blood sugar three times per day. Dx insulin using E11.8  . levETIRAcetam (KEPPRA) 500 MG tablet Take 1 tablet (500 mg total) by mouth 2 (two) times daily.  . metoprolol succinate (TOPROL-XL) 50 MG 24 hr tablet Take 50 mg by mouth daily. Take with or immediately following a meal.  . metoprolol tartrate (LOPRESSOR) 50 MG tablet TAKE 1 TABLET BY MOUTH EVERY DAY WITH OR IMME DIATELY FOLLOWING A MEAL FOR HYPERTENSION  . montelukast (SINGULAIR) 10 MG  tablet Take 1 tablet (10 mg total) by mouth at bedtime. (Patient not taking: Reported on 02/17/2018)  . Multiple Vitamin (MULTIVITAMIN WITH MINERALS) TABS tablet Take 1 tablet by mouth daily.  . nitroGLYCERIN (NITROSTAT) 0.4 MG SL tablet Place 1 tablet (0.4 mg total) under the tongue every 5 (five) minutes as needed for chest pain.  Marland Kitchen Olopatadine HCl (PAZEO) 0.7 % SOLN Apply 1 drop to eye daily.  . pantoprazole (PROTONIX) 40 MG tablet Take 1 tablet (40 mg total) by mouth daily.  Vladimir Faster Glycol-Propyl Glycol (SYSTANE ULTRA OP) Place 1 drop into both eyes every 6 (six) hours as needed (dry eyes).  . potassium chloride (K-DUR,KLOR-CON) 10 MEQ tablet Take 1 tablet by mouth 2 (two) times daily.   No facility-administered encounter medications on file as of 03/13/2018.     Functional Status:   In your present state of health, do you have any difficulty performing the following activities: 02/13/2018 12/13/2017  Hearing? Tempie Donning  Vision? Y N  Difficulty concentrating or making decisions? Y N  Walking or climbing stairs? Y Y  Dressing or bathing? Y N  Doing errands, shopping? Tempie Donning  Preparing Food and eating ? Y -  Using the Toilet? Y -  In the past six months, have you accidently leaked urine? Y -  Do you have problems with loss of bowel control? Y -  Managing your Medications? Y -  Managing your Finances? N -  Housekeeping or managing your Housekeeping? Y -  Some recent data might be hidden    Fall/Depression Screening:    Fall Risk  01/31/2018 10/27/2017 04/25/2017  Falls in the past year? Yes Yes Yes  Number falls in past yr: 2 or more 2 or more 1  Injury with Fall? No Yes No  Risk Factor Category  High Fall Risk High Fall Risk -  Risk for fall due to : History of fall(s);Impaired balance/gait;Medication side effect;Impaired mobility;Impaired vision - -  Risk for fall due to: Comment - - -  Follow up Falls evaluation completed;Falls prevention discussed;Education provided - -   PHQ 2/9  Scores 01/31/2018 12/29/2016 12/23/2016 10/15/2016 05/19/2016 04/01/2016 03/17/2016  PHQ - 2 Score 0 6 0 0 0 1 0  PHQ- 9 Score - - - - - - -    Assessment:    Met with member at scheduled time. She report PT is focusing in strength in her legs as well as limited mobility and pain in her right arm, unable to lift arm over her head at this time.  She hasn't had any falls in the past month, denies receiving life alert system yet.  Call was placed to insurance company, made aware that order was not processed, but will process today.   Member denies any urgent concerns but does express frustration regarding having a car and not being able to  drive it for over a year.  State she is waiting for neurologist to send paperwork to [facility] so she has be tested again for safety.  Her next appointment with neurologist is November.  Denies questions, advised to contact with concerns.  Plan:   Will follow up within the next month to follow up on wellness visit and life alert system.  If stable, will consider transition to health coach.  [facility] CM Care Plan Problem One     Most Recent Value  Care Plan Problem One  Risk for fall as evidenced by recent falls  Role Documenting the Problem One  Care Management [place] for Problem One  Active  [facility] Long Term Goal   Member will not have any falls within the next 31 days  [facility] Long Term Goal Start Date  02/13/18  [facility] Long Term Goal Met Date  03/13/18  [facility] CM Short Term Goal #1   Member will have life alert system set up within the next 4 weeks  [facility] CM Short Term Goal #1 Start Date  03/13/18 [Not met, date reset]  Interventions for Short Term Goal #1  Call placed again to [employer] and [place] to follow up on life alert system.  System ordered again.  [facility] CM Short Term Goal #2   Member will have home health PT active within the next 4 weeks  [facility] CM Short Term Goal #2 Start Date  02/13/18  [facility] CM Short Term Goal #2 Met Date  03/13/18  [facility] CM Short Term  Goal #3  Member will have annual wellness assessment scheduled within the next 4 weeks  [facility] CM Short Term Goal #3 Start Date  03/13/18  Interventions for Short Tern Goal #3  Call placed to primary MD office to request order be placed to have appointment scheduled.     Valente David, [place], MSN [place] [phone]

## 2018-03-14 DIAGNOSIS — M170 Bilateral primary osteoarthritis of knee: Secondary | ICD-10-CM | POA: Diagnosis not present

## 2018-03-14 DIAGNOSIS — R269 Unspecified abnormalities of gait and mobility: Secondary | ICD-10-CM | POA: Diagnosis not present

## 2018-03-14 DIAGNOSIS — M6281 Muscle weakness (generalized): Secondary | ICD-10-CM | POA: Diagnosis not present

## 2018-03-14 DIAGNOSIS — E119 Type 2 diabetes mellitus without complications: Secondary | ICD-10-CM | POA: Diagnosis not present

## 2018-03-14 DIAGNOSIS — N183 Chronic kidney disease, stage 3 (moderate): Secondary | ICD-10-CM | POA: Diagnosis not present

## 2018-03-14 DIAGNOSIS — I10 Essential (primary) hypertension: Secondary | ICD-10-CM | POA: Diagnosis not present

## 2018-03-14 DIAGNOSIS — J449 Chronic obstructive pulmonary disease, unspecified: Secondary | ICD-10-CM | POA: Diagnosis not present

## 2018-03-14 DIAGNOSIS — M25561 Pain in right knee: Secondary | ICD-10-CM | POA: Diagnosis not present

## 2018-03-14 DIAGNOSIS — M25562 Pain in left knee: Secondary | ICD-10-CM | POA: Diagnosis not present

## 2018-03-14 NOTE — Progress Notes (Signed)
Subjective:   Brittany Roberts is a 77 y.o. female who presents for Medicare Annual (Subsequent) preventive examination.  Reports health as fair Was in ER and was in CHF per her report  States her arms hurts and she can't raise her arm  States this has been going on 2 to 3 months  Seen Dr. [name] in May 2019   Diet Chol/hdl 2.7  A1c 6.5  (losing weight on home scale 262.8 this am) Changed diet Started to eat more vegetables and fruits  The doctor took her off of salt   States her bs has been 200 to 270 in the am since August  Afternoon bs was 270 Tuesday Does not check BS at hs Going on 2 weeks  Last Monday 178  Does not feel sick    Exercise Just walks for exercise and tries to get up and down   Diabetic eye exam 02/2017 Needs to make an eye apt but states she had cataract this past year   Health Maintenance Due  Topic Date Due  . TETANUS/TDAP  08/06/2015  . FOOT EXAM  09/08/2017  . HEMOGLOBIN A1C  01/16/2018  . INFLUENZA VACCINE  02/02/2018  . OPHTHALMOLOGY EXAM  02/21/2018   Will hold tetanus due to cost  Educated regarding shingrix  Had high dose flu today   Foot exam negative but does see Dr. Terance Hart on a regular bases    Colonoscopy 12/2013 - states Dr. Deatra Ina will do another one  He follows and lets her know when these are due  Mammogram 04/2016; did not get this last year [facility] with her approval  dexa 01/2015 neg       Objective:     Vitals: BP 110/80   Pulse (!) 57   Ht 5' 3.5" (1.613 m)   Wt 267 lb 6 oz (121.3 kg)   SpO2 96%   BMI 46.62 kg/m   Body mass index is 46.62 kg/m.  Advanced Directives 03/15/2018 01/31/2018 12/13/2017 09/02/2017 09/01/2017 04/08/2017 01/31/2017  Does Patient Have a Medical Advance Directive? Yes Yes No No Yes No Yes  Type of Advance Directive - Living will - Healthcare Power of [place];Living will - Out of facility DNR (pink MOST or yellow form)  Does patient want  to make changes to medical advance directive? - No - Patient declined - Yes (Inpatient - patient requests chaplain consult to change a medical advance directive) Yes (Inpatient - patient requests chaplain consult to change a medical advance directive) - No - Patient declined  Copy of [place] in Chart? - - - - No - copy requested - -  Would patient like information on creating a medical advance directive? - - Yes (Inpatient - patient requests chaplain consult to create a medical advance directive) Yes (Inpatient - patient requests chaplain consult to create a medical advance directive) - - No - Patient declined  Pre-existing out of facility DNR order (yellow form or pink MOST form) - - - - - - Yellow form placed in chart (order not valid for inpatient use)    Tobacco Social History   Tobacco Use  Smoking Status Former Smoker  . Packs/day: 3.00  . Years: 40.00  . Pack years: 120.00  . Types: Cigarettes  . Start date: 07/05/1960  . Last attempt to quit: 07/05/1998  . Years since quitting: 19.7  Smokeless Tobacco Never Used     Counseling given: Not Answered  Clinical Intake:    Past Medical History:  Diagnosis Date  . Acute delirium 01/14/2017  . Anxiety   . Arthritis    "all over"  . Asthma   . Bipolar disorder ([place])   . Complication of anesthesia    "w/right foot OR they gave me too much and they couldn't get me woke"  . Congestive heart failure, unspecified   . Coronary artery calcification seen on CAT scan 01/14/2017  . Coronary artery disease    "I've got 1 stent" (06/30/2015)  . Depression   . Discoid lupus   . Fibromyalgia    "I've been told I have this; can't take Lyrica cause I'm allergic to it" (06/30/2015)  . GERD (gastroesophageal reflux disease)   . History of gout   . History of hiatal hernia    "real bad" (06/30/2015)  . Hypertension   . Hypothyroidism   . On home oxygen therapy    "2L q hs" (06/30/2015)  . OSA (obstructive sleep apnea)     "just use my oxygen; no mask" (06/30/2015)  . Other and unspecified hyperlipidemia   . Penetrating foot wound    left nonhealing foot wound on the dorsal surface  . Peripheral neuropathy   . Pneumonia "many of times"  . Refusal of blood transfusions as patient is Jehovah's Witness   . Renal failure, unspecified    "kidneys not working 100%" (06/30/2015)  . Sickle cell trait ([place])   . Systemic lupus ([place])   . Thoracic aortic atherosclerosis ([place]) 01/14/2017  . Type II diabetes mellitus ([place])   . Unspecified vitamin D deficiency    Past Surgical History:  Procedure Laterality Date  . ANKLE ARTHROSCOPY WITH OPEN REDUCTION INTERNAL FIXATION (ORIF) Right 03/2011  . CARDIAC CATHETERIZATION N/A 07/01/2015   Procedure: Left Heart Cath and Coronary Angiography;  Surgeon: Adrian Prows, MD;  Location: [facility];  Service: Cardiovascular;  Laterality: N/A;  . CARDIAC CATHETERIZATION N/A 07/01/2015   Procedure: Intravascular Pressure Wire/FFR Study;  Surgeon: Adrian Prows, MD;  Location: [facility];  Service: Cardiovascular;  Laterality: N/A;  . CARPAL TUNNEL RELEASE Bilateral   . CATARACT EXTRACTION W/ INTRAOCULAR LENS  IMPLANT, BILATERAL Bilateral   . CHOLECYSTECTOMY OPEN    . COLONOSCOPY N/A 12/10/2013   Procedure: COLONOSCOPY;  Surgeon: Inda Castle, MD;  Location: [facility];  Service: Endoscopy;  Laterality: N/A;  . FRACTURE SURGERY    . INCISION AND DRAINAGE ABSCESS Left    foot "under my little toe"  . KNEE LIGAMENT RECONSTRUCTION Right   . LEFT HEART CATHETERIZATION WITH CORONARY ANGIOGRAM N/A 08/08/2012   Procedure: LEFT HEART CATHETERIZATION WITH CORONARY ANGIOGRAM;  Surgeon: [name], MD;  Location: [facility] C[facility] LAB;  Service: Cardiovascular;  Laterality: N/A;  . NASAL SINUS SURGERY    . PERCUTANEOUS CORONARY STENT INTERVENTION (PCI-S) N/A 08/21/2012   Procedure: PERCUTANEOUS CORONARY STENT INTERVENTION (PCI-S);  Surgeon: [name], MD;  Location: [facility] CATH  LAB;  Service: Cardiovascular;  Laterality: N/A;  . TUBAL LIGATION     Family History  Problem Relation Age of Onset  . Heart disease Mother   . Diabetes Mother   . Clotting disorder Mother   . Pneumonia Father   . Rheum arthritis Father   . Diabetes Sister   . Diabetes Sister   . Asthma Brother   . Cancer Brother   . Kidney disease Brother   . Lupus Son   . Heart disease Son   . Heart  disease Daughter   . Colon cancer Neg Hx    Social History   Socioeconomic History  . Marital status: Single    Spouse name: Not on file  . Number of children: 4  . Years of education: 67 th  . Highest education level: Not on file  Occupational History  . Occupation: Retired- Engineer, production    Employer: RETIRED  Social Needs  . Financial resource strain: Not on file  . Food insecurity:    Worry: Not on file    Inability: Not on file  . Transportation needs:    Medical: Not on file    Non-medical: Not on file  Tobacco Use  . Smoking status: Former Smoker    Packs/day: 3.00    Years: 40.00    Pack years: 120.00    Types: Cigarettes    Start date: 07/05/1960    Last attempt to quit: 07/05/1998    Years since quitting: 19.7  . Smokeless tobacco: Never Used  Substance and Sexual Activity  . Alcohol use: No    Alcohol/week: 0.0 standard drinks    Comment: 06/30/2015 "used to drink a little alcohol on the weekends; not q weekend; nothing in years"  . Drug use: No  . Sexual activity: Never  Lifestyle  . Physical activity:    Days per week: Not on file    Minutes per session: Not on file  . Stress: Not on file  Relationships  . Social connections:    Talks on phone: Not on file    Gets together: Not on file    Attends religious service: Not on file    Active member of club or organization: Not on file    Attends meetings of clubs or organizations: Not on file    Relationship status: Not on file  Other Topics Concern  . Not on file  Social History Narrative   Health  Care POA:    Emergency Contact: daughter, Lorenso Courier [phone] or [name] [phone]   End of Life Plan: Does not want to be ventilated or feeding tubes.    Who lives with you: Lives alone   Any pets: none   Diet: Patient reports enjoying and eating junk food.  Does not regulate types of food and currently her dentures are broken so it is hard to eat some foods.   Exercise: Patient does not have an exercise plan.   Seatbelts: Patient reports wearing seatbelt when in vehicle.   Sun Exposure/Protection: Patient reports not going outside very often.   Hobbies: Patient enjoys reading the bible and watching game shows.    Right-handed.   1 cup caffeine per day.   Former smoker-stopped 2000   Alcohol none    Outpatient Encounter Medications as of 03/15/2018  Medication Sig  . ACCU-CHEK AVIVA PLUS test strip TEST THREE TIMES A DAY  . acetaminophen (TYLENOL) 325 MG tablet Take 650 mg by mouth every 6 (six) hours as needed for mild pain or moderate pain.  Marland Kitchen albuterol (PROAIR HFA) 108 (90 Base) MCG/ACT inhaler inhale 2 puffs by mouth every 4 hours if needed USE ONLY IF YOU ARE WHEEZING  . allopurinol (ZYLOPRIM) 100 MG tablet Take 1 tablet (100 mg total) by mouth daily.  Marland Kitchen amLODipine (NORVASC) 10 MG tablet TAKE 1 TABLET BY MOUTH ONCE DAILY  . Ascorbic Acid (VITAMIN C PO) Take 1 tablet by mouth daily.  Marland Kitchen aspirin EC 81 MG tablet Take 81 mg by mouth daily.   Marland Kitchen atorvastatin (  LIPITOR) 20 MG tablet TAKE 1 TABLET BY MOUTH DAILY  . B Complex Vitamins (VITAMIN B COMPLEX) TABS Take 1 tablet by mouth daily.  . Blood Glucose Monitoring Suppl (ACCU-CHEK AVIVA) device Use to test blood sugar three times a day. Dx. E11.9  . Cholecalciferol (VITAMIN D3) 5000 UNITS CAPS Take 5,000 Units by mouth daily.  . ferrous sulfate 325 (65 FE) MG tablet Take 325 mg by mouth daily with breakfast.  . furosemide (LASIX) 40 MG tablet TAKE 1 TABLET BY MOUTH ONCE DAILY  . gabapentin (NEURONTIN) 300 MG capsule Take 1 capsule  (300 mg total) by mouth 2 (two) times daily.  Marland Kitchen guaiFENesin (ROBITUSSIN) 100 MG/5ML liquid Take 200 mg by mouth 3 (three) times daily as needed for cough.  . Insulin NPH, Human,, Isophane, (HUMULIN N KWIKPEN) 100 UNIT/ML Kiwkpen INJECT 30 UNITS SUBCUTANEOUSLY EVERY MORNING  . Insulin Pen Needle (PEN NEEDLES 31GX5/16") 31G X 8 MM MISC Check Blood sugar three times per day. Dx insulin using E11.8  . levETIRAcetam (KEPPRA) 500 MG tablet Take 1 tablet (500 mg total) by mouth 2 (two) times daily.  . metoprolol tartrate (LOPRESSOR) 50 MG tablet TAKE 1 TABLET BY MOUTH EVERY DAY WITH OR IMME DIATELY FOLLOWING A MEAL FOR HYPERTENSION  . montelukast (SINGULAIR) 10 MG tablet Take 1 tablet (10 mg total) by mouth at bedtime.  . Multiple Vitamin (MULTIVITAMIN WITH MINERALS) TABS tablet Take 1 tablet by mouth daily.  . nitroGLYCERIN (NITROSTAT) 0.4 MG SL tablet Place 1 tablet (0.4 mg total) under the tongue every 5 (five) minutes as needed for chest pain.  Marland Kitchen Olopatadine HCl (PAZEO) 0.7 % SOLN Apply 1 drop to eye daily.  . pantoprazole (PROTONIX) 40 MG tablet Take 1 tablet (40 mg total) by mouth daily.  Vladimir Faster Glycol-Propyl Glycol (SYSTANE ULTRA OP) Place 1 drop into both eyes every 6 (six) hours as needed (dry eyes).  . potassium chloride (K-DUR,KLOR-CON) 10 MEQ tablet Take 1 tablet by mouth 2 (two) times daily.  . [DISCONTINUED] colchicine 0.6 MG tablet TAKE ONE TABLET BY MOUTH TWICE A DAILY FOR 1 WEEK, THE AS NEEDED 2 TABLETS AT ONCE WITH GOUT ONSET; MAX OF 2 PER DAY (Patient not taking: Reported on 02/17/2018)  . [DISCONTINUED] Flaxseed, Linseed, (FLAXSEED OIL PO) Take 1 capsule by mouth daily.  . [DISCONTINUED] furosemide (LASIX) 40 MG tablet Take 1 tablet (40 mg total) by mouth daily. Take am and 3 pm until 12/19/2017  (Twice daily for 3 more days) then once a day  . [DISCONTINUED] metoprolol succinate (TOPROL-XL) 50 MG 24 hr tablet Take 50 mg by mouth daily. Take with or immediately following a meal.    No facility-administered encounter medications on file as of 03/15/2018.     Activities of Daily Living In your present state of health, do you have any difficulty performing the following activities: 03/15/2018 02/13/2018  Hearing? Tempie Donning  Vision? Y Y  Difficulty concentrating or making decisions? - Y  Walking or climbing stairs? - Y  Dressing or bathing? - Y  Doing errands, shopping? - Y  Preparing Food and eating ? - Y  Using the Toilet? - Y  In the past six months, have you accidently leaked urine? - Y  Do you have problems with loss of bowel control? - Y  Managing your Medications? - Y  Managing your Finances? - N  Housekeeping or managing your Housekeeping? - Y  Some recent data might be hidden    Patient Care Team: [name], Betty  G, MD as PCP - General (Family Medicine) Inocencio Homes, DPM as Consulting Physician (Podiatry) Adrian Prows, MD as Consulting Physician (Cardiology) Inda Castle, MD (Inactive) as Consulting Physician (Gastroenterology) Elayne Guerin, [facility] as [place] Management (Pharmacist) Valente David, RN as [place] Management    Assessment:   This is a routine wellness examination for Iszabella.  Exercise Activities and Dietary recommendations    Goals    . Do chair exercises during the Price is Right every day    . Eat 4-5 vegetables a day    . Exercise 150 min/wk Moderate Activity     Keep getting up and down and walking around the home    . HEMOGLOBIN A1C < 8    . Weight (lb) < 249 lb (112.9 kg) (pt-stated)     7 % weight loss       Fall Risk Fall Risk  03/15/2018 01/31/2018 10/27/2017 04/25/2017 12/23/2016  Falls in the past year? No Yes Yes Yes Yes  Number falls in past yr: - 2 or more 2 or more 1 1  Injury with Fall? - No Yes No No  Risk Factor Category  - High Fall Risk High Fall Risk - -  Risk for fall due to : - History of fall(s);Impaired balance/gait;Medication side effect;Impaired mobility;Impaired  vision - - -  Risk for fall due to: Comment - - - - -  Follow up - Falls evaluation completed;Falls prevention discussed;Education provided - - -     Depression Screen PHQ 2/9 Scores 03/15/2018 01/31/2018 12/29/2016 12/23/2016  PHQ - 2 Score 0 0 6 0  PHQ- 9 Score - - - -     Cognitive Function MMSE - Mini Mental State Exam 10/27/2017 10/15/2010  Orientation to time 5 5  Orientation to Place 5 5  Registration 3 1  Attention/ Calculation 5 4  Recall 3 1  Language- name 2 objects 2 2  Language- repeat 1 1  Language- follow 3 step command 3 3  Language- read & follow direction 1 1  Write a sentence 1 1  Copy design 1 1  Total score 30 25     6CIT Screen 03/15/2018  What Year? 0 points  What month? 0 points  What time? 0 points  Count back from 20 0 points  Months in reverse 0 points  Repeat phrase 0 points  Total Score 0    Immunization History  Administered Date(s) Administered  . Influenza Split 04/03/2012, 02/24/2016  . Influenza Whole 05/07/2009, 05/08/2010, 03/15/2011  . Influenza, High Dose Seasonal PF 04/08/2017, 03/15/2018  . Influenza,inj,Quad PF,6+ Mos 04/25/2013, 04/05/2014, 06/05/2015  . Pneumococcal Conjugate-13 08/10/2013  . Pneumococcal Polysaccharide-23 08/05/2000, 02/26/2009, 02/24/2016  . Td 08/05/2005   Screening Tests Health Maintenance  Topic Date Due  . TETANUS/TDAP  08/06/2015  . FOOT EXAM  09/08/2017  . HEMOGLOBIN A1C  01/16/2018  . INFLUENZA VACCINE  02/02/2018  . OPHTHALMOLOGY EXAM  02/21/2018  . DEXA SCAN  Completed  . PNA vac Low Risk Adult  Completed         Plan:      PCP Notes   Health Maintenance Will hold tetanus due to cost  Educated regarding shingrix  Had high dose flu today   Reviewed the need to keep up with her eye exams. (call to Dr. Trish Fountain office and was last seen 08/2016 )  Encouraged to schedule an apt   Foot exam negative but does see Dr. Terance Hart  on a regular bases    Colonoscopy 12/2013 - states Dr.  Deatra Ina will do another one  He follows and lets her know when these are due  Mammogram 04/2016; did not get this last year Ordered at the [facility] with her approval  dexa 01/2015 neg    Abnormal Screens  Has hearing aids which seem to work, but can't keep them in her ear  Will make an apt for another fitting   States her Bs have been 15 to 76 in the am since August. Had a written record from August  Referrals  To Dr. [name] today   Patient concerns; C/o of pain in right upper arm. Difficulty lifting arm.   Nurse Concerns; Ordered toprol XL and lopressor  She thinks she Is taking Lopressor   BS running 260 to 270 in the am   Next PCP apt - apt made to see Dr. [name] today to fup on A1c with elevated BS per the patients' report and pain in right arm      I have personally reviewed and noted the following in the patient's chart:   . Medical and social history . Use of alcohol, tobacco or illicit drugs  . Current medications and supplements . Functional ability and status . Nutritional status . Physical activity . Advanced directives . List of other physicians . Hospitalizations, surgeries, and ER visits in previous 12 months . Vitals . Screenings to include cognitive, depression, and falls . Referrals and appointments  In addition, I have reviewed and discussed with patient certain preventive protocols, quality metrics, and best practice recommendations. A written personalized care plan for preventive services as well as general preventive health recommendations were provided to patient.     Wynetta Fines, RN  03/15/2018

## 2018-03-15 ENCOUNTER — Ambulatory Visit (INDEPENDENT_AMBULATORY_CARE_PROVIDER_SITE_OTHER): Payer: Medicare Other

## 2018-03-15 ENCOUNTER — Encounter: Payer: Self-pay | Admitting: Family Medicine

## 2018-03-15 ENCOUNTER — Ambulatory Visit (INDEPENDENT_AMBULATORY_CARE_PROVIDER_SITE_OTHER): Payer: Medicare Other | Admitting: Family Medicine

## 2018-03-15 VITALS — BP 110/80 | HR 57 | Temp 98.0°F | Resp 16 | Ht 63.5 in | Wt 267.4 lb

## 2018-03-15 VITALS — BP 110/80 | HR 57 | Ht 63.5 in | Wt 267.4 lb

## 2018-03-15 DIAGNOSIS — M159 Polyosteoarthritis, unspecified: Secondary | ICD-10-CM | POA: Diagnosis not present

## 2018-03-15 DIAGNOSIS — E1140 Type 2 diabetes mellitus with diabetic neuropathy, unspecified: Secondary | ICD-10-CM

## 2018-03-15 DIAGNOSIS — M25511 Pain in right shoulder: Secondary | ICD-10-CM | POA: Diagnosis not present

## 2018-03-15 DIAGNOSIS — Z23 Encounter for immunization: Secondary | ICD-10-CM

## 2018-03-15 DIAGNOSIS — G8929 Other chronic pain: Secondary | ICD-10-CM | POA: Diagnosis not present

## 2018-03-15 DIAGNOSIS — Z1231 Encounter for screening mammogram for malignant neoplasm of breast: Secondary | ICD-10-CM

## 2018-03-15 DIAGNOSIS — Z0000 Encounter for general adult medical examination without abnormal findings: Secondary | ICD-10-CM

## 2018-03-15 LAB — POCT GLYCOSYLATED HEMOGLOBIN (HGB A1C): Hemoglobin A1C: 6.5 % — AB (ref 4.0–5.6)

## 2018-03-15 MED ORDER — DICLOFENAC SODIUM 1 % TD GEL: 4.0000 g | Freq: Four times a day (QID) | TRANSDERMAL | 3 refills | Status: DC

## 2018-03-15 NOTE — Patient Instructions (Addendum)
Brittany Roberts , Thank you for taking time to come for your Medicare Wellness Visit. I appreciate your ongoing commitment to your health goals. Please review the following plan we discussed and let me know if I can assist you in the future.   Needs to make apt for eye fup   You need to your hearing aids refitted   A Tetanus is recommended every 10 years. Medicare covers a tetanus if you have a cut or wound; otherwise, there may be a charge. If you had not had a tetanus with pertusses, known as the Tdap, you can take this anytime.   Shingrix is a vaccine for the prevention of Shingles in Adults 50 and older.  If you are on Medicare, the shingrix is covered under your Part D plan, so you will take both of the vaccines in the series at your pharmacy. Please check with your benefits regarding applicable copays or out of pocket expenses.  The Shingrix is given in 2 vaccines approx 8 weeks apart. You must receive the 2nd dose prior to 6 months from receipt of the first. Please have the pharmacist print out you Immunization  dates for our office records     These are the goals we discussed: Goals    . Do chair exercises during the [employer] every day    . Eat 4-5 vegetables a day    . HEMOGLOBIN A1C < 8    . Weight (lb) < 249 lb (112.9 kg) (pt-stated)     7 % weight loss       This is a list of the screening recommended for you and due dates:  Health Maintenance  Topic Date Due  . Tetanus Vaccine  08/06/2015  . Complete foot exam   09/08/2017  . Hemoglobin A1C  01/16/2018  . Flu Shot  02/02/2018  . Eye exam for diabetics  02/21/2018  . DEXA scan (bone density measurement)  Completed  . Pneumonia vaccines  Completed      Fall Prevention in the Home Falls can cause injuries. They can happen to people of all ages. There are many things you can do to make your home safe and to help prevent falls. What can I do on the outside of my home?  Regularly fix the edges of walkways and  driveways and fix any cracks.  Remove anything that might make you trip as you walk through a door, such as a raised step or threshold.  Trim any bushes or trees on the path to your home.  Use bright outdoor lighting.  Clear any walking paths of anything that might make someone trip, such as rocks or tools.  Regularly check to see if handrails are loose or broken. Make sure that both sides of any steps have handrails.  Any raised decks and porches should have guardrails on the edges.  Have any leaves, snow, or ice cleared regularly.  Use sand or salt on walking paths during winter.  Clean up any spills in your garage right away. This includes oil or grease spills. What can I do in the bathroom?  Use night lights.  Install grab bars by the toilet and in the tub and shower. Do not use towel bars as grab bars.  Use non-skid mats or decals in the tub or shower.  If you need to sit down in the shower, use a plastic, non-slip stool.  Keep the floor dry. Clean up any water that spills on the floor as  soon as it happens.  Remove soap buildup in the tub or shower regularly.  Attach bath mats securely with double-sided non-slip rug tape.  Do not have throw rugs and other things on the floor that can make you trip. What can I do in the bedroom?  Use night lights.  Make sure that you have a light by your bed that is easy to reach.  Do not use any sheets or blankets that are too big for your bed. They should not hang down onto the floor.  Have a firm chair that has side arms. You can use this for support while you get dressed.  Do not have throw rugs and other things on the floor that can make you trip. What can I do in the kitchen?  Clean up any spills right away.  Avoid walking on wet floors.  Keep items that you use a lot in easy-to-reach places.  If you need to reach something above you, use a strong step stool that has a grab bar.  Keep electrical cords out of the  way.  Do not use floor polish or wax that makes floors slippery. If you must use wax, use non-skid floor wax.  Do not have throw rugs and other things on the floor that can make you trip. What can I do with my stairs?  Do not leave any items on the stairs.  Make sure that there are handrails on both sides of the stairs and use them. Fix handrails that are broken or loose. Make sure that handrails are as long as the stairways.  Check any carpeting to make sure that it is firmly attached to the stairs. Fix any carpet that is loose or worn.  Avoid having throw rugs at the top or bottom of the stairs. If you do have throw rugs, attach them to the floor with carpet tape.  Make sure that you have a light switch at the top of the stairs and the bottom of the stairs. If you do not have them, ask someone to add them for you. What else can I do to help prevent falls?  Wear shoes that: ? Do not have high heels. ? Have rubber bottoms. ? Are comfortable and fit you well. ? Are closed at the toe. Do not wear sandals.  If you use a stepladder: ? Make sure that it is fully opened. Do not climb a closed stepladder. ? Make sure that both sides of the stepladder are locked into place. ? Ask someone to hold it for you, if possible.  Clearly mark and make sure that you can see: ? Any grab bars or handrails. ? First and last steps. ? Where the edge of each step is.  Use tools that help you move around (mobility aids) if they are needed. These include: ? Canes. ? Walkers. ? Scooters. ? Crutches.  Turn on the lights when you go into a dark area. Replace any light bulbs as soon as they burn out.  Set up your furniture so you have a clear path. Avoid moving your furniture around.  If any of your floors are uneven, fix them.  If there are any pets around you, be aware of where they are.  Review your medicines with your doctor. Some medicines can make you feel dizzy. This can increase your chance  of falling. Ask your doctor what other things that you can do to help prevent falls. This information is not intended to replace advice given  to you by your health care provider. Make sure you discuss any questions you have with your health care provider. Document Released: 04/17/2009 Document Revised: 11/27/2015 Document Reviewed: 07/26/2014 Elsevier Interactive Patient Education  2018 [employer] Maintenance, Female Adopting a healthy lifestyle and getting preventive care can go a long way to promote health and wellness. Talk with your health care provider about what schedule of regular examinations is right for you. This is a good chance for you to check in with your provider about disease prevention and staying healthy. In between checkups, there are plenty of things you can do on your own. Experts have done a lot of research about which lifestyle changes and preventive measures are most likely to keep you healthy. Ask your health care provider for more information. Weight and diet Eat a healthy diet  Be sure to include plenty of vegetables, fruits, low-fat dairy products, and lean protein.  Do not eat a lot of foods high in solid fats, added sugars, or salt.  Get regular exercise. This is one of the most important things you can do for your health. ? Most adults should exercise for at least 150 minutes each week. The exercise should increase your heart rate and make you sweat (moderate-intensity exercise). ? Most adults should also do strengthening exercises at least twice a week. This is in addition to the moderate-intensity exercise.  Maintain a healthy weight  Body mass index (BMI) is a measurement that can be used to identify possible weight problems. It estimates body fat based on height and weight. Your health care provider can help determine your BMI and help you achieve or maintain a healthy weight.  For females 83 years of age and older: ? A BMI below 18.5 is  considered underweight. ? A BMI of 18.5 to 24.9 is normal. ? A BMI of 25 to 29.9 is considered overweight. ? A BMI of 30 and above is considered obese.  Watch levels of cholesterol and blood lipids  You should start having your blood tested for lipids and cholesterol at 78 years of age, then have this test every 5 years.  You may need to have your cholesterol levels checked more often if: ? Your lipid or cholesterol levels are high. ? You are older than 78 years of age. ? You are at high risk for heart disease.  Cancer screening Lung Cancer  Lung cancer screening is recommended for adults 39-22 years old who are at high risk for lung cancer because of a history of smoking.  A yearly low-dose CT scan of the lungs is recommended for people who: ? Currently smoke. ? Have quit within the past 15 years. ? Have at least a 30-pack-year history of smoking. A pack year is smoking an average of one pack of cigarettes a day for 1 year.  Yearly screening should continue until it has been 15 years since you quit.  Yearly screening should stop if you develop a health problem that would prevent you from having lung cancer treatment.  Breast Cancer  Practice breast self-awareness. This means understanding how your breasts normally appear and feel.  It also means doing regular breast self-exams. Let your health care provider know about any changes, no matter how small.  If you are in your 20s or 30s, you should have a clinical breast exam (CBE) by a health care provider every 1-3 years as part of a regular health exam.  If you are 29 or older, have a  CBE every year. Also consider having a breast X-ray (mammogram) every year.  If you have a family history of breast cancer, talk to your health care provider about genetic screening.  If you are at high risk for breast cancer, talk to your health care provider about having an MRI and a mammogram every year.  Breast cancer gene (BRCA) assessment  is recommended for women who have family members with BRCA-related cancers. BRCA-related cancers include: ? Breast. ? Ovarian. ? Tubal. ? Peritoneal cancers.  Results of the assessment will determine the need for genetic counseling and BRCA1 and BRCA2 testing.  Cervical Cancer Your health care provider may recommend that you be screened regularly for cancer of the pelvic organs (ovaries, uterus, and vagina). This screening involves a pelvic examination, including checking for microscopic changes to the surface of your cervix (Pap test). You may be encouraged to have this screening done every 3 years, beginning at age 80.  For women ages 72-65, health care providers may recommend pelvic exams and Pap testing every 3 years, or they may recommend the Pap and pelvic exam, combined with testing for human papilloma virus (HPV), every 5 years. Some types of HPV increase your risk of cervical cancer. Testing for HPV may also be done on women of any age with unclear Pap test results.  Other health care providers may not recommend any screening for nonpregnant women who are considered low risk for pelvic cancer and who do not have symptoms. Ask your health care provider if a screening pelvic exam is right for you.  If you have had past treatment for cervical cancer or a condition that could lead to cancer, you need Pap tests and screening for cancer for at least 20 years after your treatment. If Pap tests have been discontinued, your risk factors (such as having a new sexual partner) need to be reassessed to determine if screening should resume. Some women have medical problems that increase the chance of getting cervical cancer. In these cases, your health care provider may recommend more frequent screening and Pap tests.  Colorectal Cancer  This type of cancer can be detected and often prevented.  Routine colorectal cancer screening usually begins at 78 years of age and continues through 78 years of  age.  Your health care provider may recommend screening at an earlier age if you have risk factors for colon cancer.  Your health care provider may also recommend using home test kits to check for hidden blood in the stool.  A small camera at the end of a tube can be used to examine your colon directly (sigmoidoscopy or colonoscopy). This is done to check for the earliest forms of colorectal cancer.  Routine screening usually begins at age 33.  Direct examination of the colon should be repeated every 5-10 years through 78 years of age. However, you may need to be screened more often if early forms of precancerous polyps or small growths are found.  Skin Cancer  Check your skin from head to toe regularly.  Tell your health care provider about any new moles or changes in moles, especially if there is a change in a mole's shape or color.  Also tell your health care provider if you have a mole that is larger than the size of a pencil eraser.  Always use sunscreen. Apply sunscreen liberally and repeatedly throughout the day.  Protect yourself by wearing long sleeves, pants, a wide-brimmed hat, and sunglasses whenever you are outside.  Heart disease,  diabetes, and high blood pressure  High blood pressure causes heart disease and increases the risk of stroke. High blood pressure is more likely to develop in: ? People who have blood pressure in the high end of the normal range (130-139/85-89 mm Hg). ? People who are overweight or obese. ? People who are African American.  If you are 89-18 years of age, have your blood pressure checked every 3-5 years. If you are 2 years of age or older, have your blood pressure checked every year. You should have your blood pressure measured twice-once when you are at a hospital or clinic, and once when you are not at a hospital or clinic. Record the average of the two measurements. To check your blood pressure when you are not at a hospital or clinic, you  can use: ? An automated blood pressure machine at a pharmacy. ? A home blood pressure monitor.  If you are between 68 years and 22 years old, ask your health care provider if you should take aspirin to prevent strokes.  Have regular diabetes screenings. This involves taking a blood sample to check your fasting blood sugar level. ? If you are at a normal weight and have a low risk for diabetes, have this test once every three years after 78 years of age. ? If you are overweight and have a high risk for diabetes, consider being tested at a younger age or more often. Preventing infection Hepatitis B  If you have a higher risk for hepatitis B, you should be screened for this virus. You are considered at high risk for hepatitis B if: ? You were born in a country where hepatitis B is common. Ask your health care provider which countries are considered high risk. ? Your parents were born in a high-risk country, and you have not been immunized against hepatitis B (hepatitis B vaccine). ? You have HIV or AIDS. ? You use needles to inject street drugs. ? You live with someone who has hepatitis B. ? You have had sex with someone who has hepatitis B. ? You get hemodialysis treatment. ? You take certain medicines for conditions, including cancer, organ transplantation, and autoimmune conditions.  Hepatitis C  Blood testing is recommended for: ? Everyone born from [birth year] through 1966. ? Anyone with known risk factors for hepatitis C.  Sexually transmitted infections (STIs)  You should be screened for sexually transmitted infections (STIs) including gonorrhea and chlamydia if: ? You are sexually active and are younger than 78 years of age. ? You are older than 78 years of age and your health care provider tells you that you are at risk for this type of infection. ? Your sexual activity has changed since you were last screened and you are at an increased risk for chlamydia or gonorrhea. Ask your  health care provider if you are at risk.  If you do not have HIV, but are at risk, it may be recommended that you take a prescription medicine daily to prevent HIV infection. This is called pre-exposure prophylaxis (PrEP). You are considered at risk if: ? You are sexually active and do not regularly use condoms or know the HIV status of your partner(s). ? You take drugs by injection. ? You are sexually active with a partner who has HIV.  Talk with your health care provider about whether you are at high risk of being infected with HIV. If you choose to begin PrEP, you should first be tested for HIV. You should  then be tested every 3 months for as long as you are taking PrEP. Pregnancy  If you are premenopausal and you may become pregnant, ask your health care provider about preconception counseling.  If you may become pregnant, take 400 to 800 micrograms (mcg) of folic acid every day.  If you want to prevent pregnancy, talk to your health care provider about birth control (contraception). Osteoporosis and menopause  Osteoporosis is a disease in which the bones lose minerals and strength with aging. This can result in serious bone fractures. Your risk for osteoporosis can be identified using a bone density scan.  If you are 70 years of age or older, or if you are at risk for osteoporosis and fractures, ask your health care provider if you should be screened.  Ask your health care provider whether you should take a calcium or vitamin D supplement to lower your risk for osteoporosis.  Menopause may have certain physical symptoms and risks.  Hormone replacement therapy may reduce some of these symptoms and risks. Talk to your health care provider about whether hormone replacement therapy is right for you. Follow these instructions at home:  Schedule regular health, dental, and eye exams.  Stay current with your immunizations.  Do not use any tobacco products including cigarettes, chewing  tobacco, or electronic cigarettes.  If you are pregnant, do not drink alcohol.  If you are breastfeeding, limit how much and how often you drink alcohol.  Limit alcohol intake to no more than 1 drink per day for nonpregnant women. One drink equals 12 ounces of beer, 5 ounces of wine, or 1 ounces of hard liquor.  Do not use street drugs.  Do not share needles.  Ask your health care provider for help if you need support or information about quitting drugs.  Tell your health care provider if you often feel depressed.  Tell your health care provider if you have ever been abused or do not feel safe at home. This information is not intended to replace advice given to you by your health care provider. Make sure you discuss any questions you have with your health care provider. Document Released: 01/04/2011 Document Revised: 11/27/2015 Document Reviewed: 03/25/2015 Elsevier Interactive Patient Education  2018 [employer].   Hearing Loss Hearing loss is a partial or total loss of the ability to hear. This can be temporary or permanent, and it can happen in one or both ears. Hearing loss may be referred to as deafness. Medical care is necessary to treat hearing loss properly and to prevent the condition from getting worse. Your hearing may partially or completely come back, depending on what caused your hearing loss and how severe it is. In some cases, hearing loss is permanent. What are the causes? Common causes of hearing loss include:  Too much wax in the ear canal.  Infection of the ear canal or middle ear.  Fluid in the middle ear.  Injury to the ear or surrounding area.  An object stuck in the ear.  Prolonged exposure to loud sounds, such as music.  Less common causes of hearing loss include:  Tumors in the ear.  Viral or bacterial infections, such as meningitis.  A hole in the eardrum (perforated eardrum).  Problems with the hearing nerve that sends signals between the  brain and the ear.  Certain medicines.  What are the signs or symptoms? Symptoms of this condition may include:  Difficulty telling the difference between sounds.  Difficulty following a conversation when  there is background noise.  Lack of response to sounds in your environment. This may be most noticeable when you do not respond to startling sounds.  Needing to turn up the volume on the television, radio, etc.  Ringing in the ears.  Dizziness.  Pain in the ears.  How is this diagnosed? This condition is diagnosed based on a physical exam and a hearing test (audiometry). The audiometry test will be performed by a hearing specialist (audiologist). You may also be referred to an ear, nose, and throat (ENT) specialist (otolaryngologist). How is this treated? Treatment for recent onset of hearing loss may include:  Ear wax removal.  Being prescribed medicines to prevent infection (antibiotics).  Being prescribed medicines to reduce inflammation (corticosteroids).  Follow these instructions at home:  If you were prescribed an antibiotic medicine, take it as told by your health care provider. Do not stop taking the antibiotic even if you start to feel better.  Take over-the-counter and prescription medicines only as told by your health care provider.  Avoid loud noises.  Return to your normal activities as told by your health care provider. Ask your health care provider what activities are safe for you.  Keep all follow-up visits as told by your health care provider. This is important. Contact a health care provider if:  You feel dizzy.  You develop new symptoms.  You vomit or feel nauseous.  You have a fever. Get help right away if:  You develop sudden changes in your vision.  You have severe ear pain.  You have new or increased weakness.  You have a severe headache. This information is not intended to replace advice given to you by your health care provider.  Make sure you discuss any questions you have with your health care provider. Document Released: 06/21/2005 Document Revised: 11/27/2015 Document Reviewed: 11/06/2014 Elsevier Interactive Patient Education  2018 [employer] A mammogram is an X-ray of the breasts that is done to check for abnormal changes. This procedure can screen for and detect any changes that may suggest breast cancer. A mammogram can also identify other changes and variations in the breast, such as:  Inflammation of the breast tissue (mastitis).  An infected area that contains a collection of pus (abscess).  A fluid-filled sac (cyst).  Fibrocystic changes. This is when breast tissue becomes denser, which can make the tissue feel rope-like or uneven under the skin.  Tumors that are not cancerous (benign).  Tell a health care provider about:  Any allergies you have.  If you have breast implants.  If you have had previous breast disease, biopsy, or surgery.  If you are breastfeeding.  Any possibility that you could be pregnant, if this applies.  If you are younger than age 44.  If you have a family history of breast cancer. What are the risks? Generally, this is a safe procedure. However, problems may occur, including:  Exposure to radiation. Radiation levels are very low with this test.  The results being misinterpreted.  The need for further tests.  The inability of the mammogram to detect certain cancers.  What happens before the procedure?  Schedule your test about 1-2 weeks after your menstrual period. This is usually when your breasts are the least tender.  If you have had a mammogram done at a different facility in the past, get the mammogram X-rays or have them sent to your current exam facility in order to compare them.  Wash your breasts and  under your arms the day of the test.  Do not wear deodorants, perfumes, lotions, or powders anywhere on your body on the day of the  test.  Remove any jewelry from your neck.  Wear clothes that you can change into and out of easily. What happens during the procedure?  You will undress from the waist up and put on a gown.  You will stand in front of the X-ray machine.  Each breast will be placed between two plastic or glass plates. The plates will compress your breast for a few seconds. Try to stay as relaxed as possible during the procedure. This does not cause any harm to your breasts and any discomfort you feel will be very brief.  X-rays will be taken from different angles of each breast. The procedure may vary among health care providers and hospitals. What happens after the procedure?  The mammogram will be examined by a specialist (radiologist).  You may need to repeat certain parts of the test, depending on the quality of the images. This is commonly done if the radiologist needs a better view of the breast tissue.  Ask when your test results will be ready. Make sure you get your test results.  You may resume your normal activities. This information is not intended to replace advice given to you by your health care provider. Make sure you discuss any questions you have with your health care provider. Document Released: 06/18/2000 Document Revised: 11/24/2015 Document Reviewed: 08/30/2014 Elsevier Interactive Patient Education  [employer].

## 2018-03-15 NOTE — Assessment & Plan Note (Signed)
We discussed possible etiologies, OA, rotator cuff tendinopathy, adhesive capsulitis. Continue PT exercises. Recommend following with orthopedist, we discussed some side effects of systemic steroids. Topical diclofenac 4 times daily recommended.

## 2018-03-15 NOTE — Patient Instructions (Addendum)
A few things to remember from today's visit:   Controlled type 2 diabetes with neuropathy ([place]) - Plan: POCT glycosylated hemoglobin (Hb A1C)  Chronic right shoulder pain - Plan: diclofenac sodium (VOLTAREN) 1 % GEL  Osteoarthritis of multiple joints, unspecified osteoarthritis type  For now no changes in insulin, today A1c was at goal. Please follow with orthopedist for your right shoulder pain.  Please be sure medication list is accurate. If a new problem present, please set up appointment sooner than planned today.

## 2018-03-15 NOTE — Assessment & Plan Note (Signed)
Recommend topical diclofenac 4 times daily. She can also take acetaminophen 500 mg 3 times per day. Given her history of CHF on CKD 3 other recommend oral NSAIDs. Currently she is seeing orthopedist for knee and shoulder OA. Fall precautions discussed.

## 2018-03-15 NOTE — Assessment & Plan Note (Signed)
HgA1C at goal. For now no changes in current management, if after decreasing amount of fruit intake fasting glucoses continue in the 200s, she was instructed to increase Humulin NPH from 30 units to 35 units. Caution with hypoglycemia. Good foot care recommended. F/U in 4 months

## 2018-03-15 NOTE — Progress Notes (Signed)
ACUTE VISIT   HPI:  Chief Complaint  Patient presents with  . Elevated blood sugars  . right shoulder pain    [name] is a 77 y.o. female, who is here today complaining of elevated BS's for the past 2 weeks.  DM 2, currently she is on Humulin NPH 30 units daily. FG in the 200's. She denies hypoglycemic events. She is trying to avoid sugar but also adds that she has been eating "a lot of fruit" for the past few weeks. She does not exercise regularly due to chronic pain and and is stable gait. No polyuria, polyphagia, or polydipsia. Burning sensation on feet, history of peripheral neuropathy.    Lab Results  Component Value Date   HGBA1C 6.5 07/19/2017   She is also complaining of right shoulder pain, which she has had "all my life." No history of recent trauma. Pain is intermittent, lateral aspect of the shoulder, pain extends to mid arm.  She denies numbness or tingling. Exacerbated by any movement as well as palpation, 10/10. Alleviated by rest. She also has pain on right trapezium, no cervical pain.  She is currently doing shoulder PT for the past couple weeks, it has not helped.  She is following with orthopedist and according to patient, she was told if she decided to have a shoulder injection to call and arrange appointment. She states that she was told if injection did not help with shoulder pain she may need surgery, she is not interested in having surgical procedure done. She has tried OTC "rubbing" medication.  She has history of generalized OA and fibromyalgia.   Review of Systems  Constitutional: Positive for fatigue. Negative for activity change, appetite change and fever.  HENT: Negative for mouth sores, nosebleeds and trouble swallowing.   Respiratory: Negative for cough, shortness of breath and wheezing.   Cardiovascular: Negative for chest pain and palpitations.  Gastrointestinal: Negative for abdominal pain, nausea and vomiting.   Negative for changes in bowel habits.  Endocrine: Positive for polyphagia. Negative for polydipsia and polyuria.  Genitourinary: Negative for decreased urine volume and hematuria.  Musculoskeletal: Positive for arthralgias, back pain, gait problem and myalgias.  Skin: Negative for rash and wound.  Neurological: Negative for syncope, weakness and headaches.  Psychiatric/Behavioral: Negative for confusion. The patient is nervous/anxious.       Current Outpatient Medications on File Prior to Visit  Medication Sig Dispense Refill  . ACCU-CHEK AVIVA PLUS test strip TEST THREE TIMES A DAY 100 each 5  . acetaminophen (TYLENOL) 325 MG tablet Take 650 mg by mouth every 6 (six) hours as needed for mild pain or moderate pain.    Marland Kitchen albuterol (PROAIR HFA) 108 (90 Base) MCG/ACT inhaler inhale 2 puffs by mouth every 4 hours if needed USE ONLY IF YOU ARE WHEEZING 1 Inhaler 0  . allopurinol (ZYLOPRIM) 100 MG tablet Take 1 tablet (100 mg total) by mouth daily. 90 tablet 3  . amLODipine (NORVASC) 10 MG tablet TAKE 1 TABLET BY MOUTH ONCE DAILY 90 tablet 0  . Ascorbic Acid (VITAMIN C PO) Take 1 tablet by mouth daily.    Marland Kitchen aspirin EC 81 MG tablet Take 81 mg by mouth daily.     Marland Kitchen atorvastatin (LIPITOR) 20 MG tablet TAKE 1 TABLET BY MOUTH DAILY 30 tablet 0  . B Complex Vitamins (VITAMIN B COMPLEX) TABS Take 1 tablet by mouth daily.    . Blood Glucose Monitoring Suppl (ACCU-CHEK AVIVA) device Use to  test blood sugar three times a day. Dx. E11.9 1 each 1  . Cholecalciferol (VITAMIN D3) 5000 UNITS CAPS Take 5,000 Units by mouth daily.    . ferrous sulfate 325 (65 FE) MG tablet Take 325 mg by mouth daily with breakfast.    . furosemide (LASIX) 40 MG tablet TAKE 1 TABLET BY MOUTH ONCE DAILY 90 tablet 0  . gabapentin (NEURONTIN) 300 MG capsule Take 1 capsule (300 mg total) by mouth 2 (two) times daily. 180 capsule 3  . guaiFENesin (ROBITUSSIN) 100 MG/5ML liquid Take 200 mg by mouth 3 (three) times daily as needed for  cough.    . Insulin NPH, Human,, Isophane, (HUMULIN N KWIKPEN) 100 UNIT/ML Kiwkpen INJECT 30 UNITS SUBCUTANEOUSLY EVERY MORNING 30 mL 3  . Insulin Pen Needle (PEN NEEDLES 31GX5/16") 31G X 8 MM MISC Check Blood sugar three times per day. Dx insulin using E11.8 100 each 3  . levETIRAcetam (KEPPRA) 500 MG tablet Take 1 tablet (500 mg total) by mouth 2 (two) times daily. 180 tablet 3  . metoprolol tartrate (LOPRESSOR) 50 MG tablet TAKE 1 TABLET BY MOUTH EVERY DAY WITH OR IMME DIATELY FOLLOWING A MEAL FOR HYPERTENSION 90 tablet 0  . montelukast (SINGULAIR) 10 MG tablet Take 1 tablet (10 mg total) by mouth at bedtime. 30 tablet 0  . Multiple Vitamin (MULTIVITAMIN WITH MINERALS) TABS tablet Take 1 tablet by mouth daily.    . nitroGLYCERIN (NITROSTAT) 0.4 MG SL tablet Place 1 tablet (0.4 mg total) under the tongue every 5 (five) minutes as needed for chest pain. 20 tablet 12  . Olopatadine HCl (PAZEO) 0.7 % SOLN Apply 1 drop to eye daily. 1 Bottle 2  . pantoprazole (PROTONIX) 40 MG tablet Take 1 tablet (40 mg total) by mouth daily. 90 tablet 3  . Polyethyl Glycol-Propyl Glycol (SYSTANE ULTRA OP) Place 1 drop into both eyes every 6 (six) hours as needed (dry eyes).    . potassium chloride (K-DUR,KLOR-CON) 10 MEQ tablet Take 1 tablet by mouth 2 (two) times daily.  2   No current facility-administered medications on file prior to visit.      Past Medical History:  Diagnosis Date  . Acute delirium 01/14/2017  . Anxiety   . Arthritis    "all over"  . Asthma   . Bipolar disorder ([place])   . Complication of anesthesia    "w/right foot OR they gave me too much and they couldn't get me woke"  . Congestive heart failure, unspecified   . Coronary artery calcification seen on CAT scan 01/14/2017  . Coronary artery disease    "I've got 1 stent" (06/30/2015)  . Depression   . Discoid lupus   . Fibromyalgia    "I've been told I have this; can't take Lyrica cause I'm allergic to it" (06/30/2015)  . GERD  (gastroesophageal reflux disease)   . History of gout   . History of hiatal hernia    "real bad" (06/30/2015)  . Hypertension   . Hypothyroidism   . On home oxygen therapy    "2L q hs" (06/30/2015)  . OSA (obstructive sleep apnea)    "just use my oxygen; no mask" (06/30/2015)  . Other and unspecified hyperlipidemia   . Penetrating foot wound    left nonhealing foot wound on the dorsal surface  . Peripheral neuropathy   . Pneumonia "many of times"  . Refusal of blood transfusions as patient is Jehovah's Witness   . Renal failure, unspecified    "kidneys  not working 100%" (06/30/2015)  . Sickle cell trait ([place])   . Systemic lupus ([place])   . Thoracic aortic atherosclerosis ([place]) 01/14/2017  . Type II diabetes mellitus ([place])   . Unspecified vitamin D deficiency    Allergies  Allergen Reactions  . Ace Inhibitors Other (See Comments)    Coincided with sig bump in creat. Retried and creat bumped again. Coincided with sig bump in creat. Retried and creat bumped again. Coincided with sig bump in creat. Retried and creat bumped again.  . Isosorb Dinitrate-Hydralazine Other (See Comments)    Sleep all the time. Sleep all the time.  Marland Kitchen Penicillins Anaphylaxis, Swelling and Rash    Has patient had a PCN reaction causing immediate rash, facial/tongue/throat swelling, SOB or lightheadedness with hypotension: Yes Has patient had a PCN reaction causing severe rash involving mucus membranes or skin necrosis: Yes Has patient had a PCN reaction that required hospitalization: Yes Has patient had a PCN reaction occurring within the last 10 years: No If all of the above answers are "NO", then may proceed with Cephalosporin use.   Marland Kitchen Buspirone Other (See Comments)    pain pain pain  . Pregabalin Swelling  . Ropinirole Hydrochloride Swelling  . Am[place]adines Rash    "Swelling of the tongue"    Social History   Socioeconomic History  . Marital status: Single    Spouse name: Not on file  .  Number of children: 4  . Years of education: 4 th  . Highest education level: Not on file  Occupational History  . Occupation: Retired- Engineer, production    Employer: RETIRED  Social Needs  . Financial resource strain: Not on file  . Food insecurity:    Worry: Not on file    Inability: Not on file  . Transportation needs:    Medical: Not on file    Non-medical: Not on file  Tobacco Use  . Smoking status: Former Smoker    Packs/day: 3.00    Years: 40.00    Pack years: 120.00    Types: Cigarettes    Start date: 07/05/1960    Last attempt to quit: 07/05/1998    Years since quitting: 19.7  . Smokeless tobacco: Never Used  Substance and Sexual Activity  . Alcohol use: No    Alcohol/week: 0.0 standard drinks    Comment: 06/30/2015 "used to drink a little alcohol on the weekends; not q weekend; nothing in years"  . Drug use: No  . Sexual activity: Never  Lifestyle  . Physical activity:    Days per week: Not on file    Minutes per session: Not on file  . Stress: Not on file  Relationships  . Social connections:    Talks on phone: Not on file    Gets together: Not on file    Attends religious service: Not on file    Active member of club or organization: Not on file    Attends meetings of clubs or organizations: Not on file    Relationship status: Not on file  Other Topics Concern  . Not on file  Social History Narrative   Health Care POA:    Emergency Contact: daughter, Lorenso Courier [phone] or [name] [phone]   End of Life Plan: Does not want to be ventilated or feeding tubes.    Who lives with you: Lives alone   Any pets: none   Diet: Patient reports enjoying and eating junk food.  Does not regulate types of food and  currently her dentures are broken so it is hard to eat some foods.   Exercise: Patient does not have an exercise plan.   Seatbelts: Patient reports wearing seatbelt when in vehicle.   Sun Exposure/Protection: Patient reports not going outside  very often.   Hobbies: Patient enjoys reading the bible and watching game shows.    Right-handed.   1 cup caffeine per day.   Former smoker-stopped 2000   Alcohol none    Vitals:   03/15/18 1200  BP: 110/80  Pulse: (!) 57  Resp: 16  Temp: 98 F (36.7 C)  SpO2: 96%   Body mass index is 46.62 kg/m.  Physical Exam  Nursing note and vitals reviewed. Constitutional: She is oriented to person, place, and time. She appears well-developed. No distress.  HENT:  Head: Normocephalic and atraumatic.  Mouth/Throat: Oropharynx is clear and moist and mucous membranes are normal.  Edentulous.  Eyes: Pupils are equal, round, and reactive to light. Conjunctivae are normal.  Cardiovascular: An irregular rhythm present. Bradycardia present.  No murmur heard. DP pulses present bilateral.  Respiratory: Effort normal and breath sounds normal. No respiratory distress.  GI: Soft. She exhibits no mass. There is no tenderness.  Musculoskeletal: She exhibits no edema.       Right shoulder: She exhibits decreased range of motion and tenderness. She exhibits no swelling.  Due to pain I was not able to performed maneuvers. Severe limitation of ROM and pain elicited with movement and palpation of lateral/post aspect of shoulder.   Lymphadenopathy:    She has no cervical adenopathy.  Neurological: She is alert and oriented to person, place, and time. She has normal strength. Gait abnormal.  Unstable gait assisted by a walker.  Skin: Skin is warm. No erythema.  Psychiatric: She has a normal mood and affect.  Well groomed, good eye contact.      ASSESSMENT AND PLAN:   Ms. Ruqaya was seen today for elevated blood sugars and right shoulder pain.  Orders Placed This Encounter  Procedures  . POCT glycosylated hemoglobin (Hb A1C)   Lab Results  Component Value Date   HGBA1C 6.5 (A) 03/15/2018    Controlled type 2 diabetes with neuropathy ([place]) HgA1C at goal. For now no changes in current  management, if after decreasing amount of fruit intake fasting glucoses continue in the 200s, she was instructed to increase Humulin NPH from 30 units to 35 units. Caution with hypoglycemia. Good foot care recommended. F/U in 4 months   Osteoarthritis Recommend topical diclofenac 4 times daily. She can also take acetaminophen 500 mg 3 times per day. Given her history of CHF on CKD 3 other recommend oral NSAIDs. Currently she is seeing orthopedist for knee and shoulder OA. Fall precautions discussed.  Chronic right shoulder pain We discussed possible etiologies, OA, rotator cuff tendinopathy, adhesive capsulitis. Continue PT exercises. Recommend following with orthopedist, we discussed some side effects of systemic steroids. Topical diclofenac 4 times daily recommended.     Return in about 4 months (around 07/15/2018) for DM II.     [name]. [name], MD  [facility]. [place] office.

## 2018-03-20 ENCOUNTER — Telehealth: Payer: Self-pay | Admitting: Family Medicine

## 2018-03-20 NOTE — Telephone Encounter (Signed)
Copied from [place] [id number]. Topic: Quick Communication - See Telephone Encounter >> Mar 20, 2018 10:41 AM Nils Flack wrote: CRM for notification. See Telephone encounter for: 03/20/18. Pt called, she would like call back from nurse.  She declined to give details  Please call [phone]

## 2018-03-20 NOTE — Telephone Encounter (Signed)
Spoke with patient and informed her that Dr. [name] stated that she does not recommend a lift chair due to fall risk. Patient verbalized understanding.

## 2018-03-20 NOTE — Telephone Encounter (Signed)
Spoke with patient and she stated that [facility] said that PCP must request a lift chair, could take up to 6 months to get, but the sooner the request is put in, they can work on getting the chair out to the patient faster. Patient stated that she does not want to get it through [place].

## 2018-03-20 NOTE — Telephone Encounter (Signed)
I do not recommend a lift chair, I do not prescribe them. In my opinion it could increase the risk of falls.  Thanks, BJ

## 2018-03-21 ENCOUNTER — Telehealth (INDEPENDENT_AMBULATORY_CARE_PROVIDER_SITE_OTHER): Payer: Self-pay

## 2018-03-21 ENCOUNTER — Telehealth (INDEPENDENT_AMBULATORY_CARE_PROVIDER_SITE_OTHER): Payer: Self-pay | Admitting: Orthopedic Surgery

## 2018-03-21 DIAGNOSIS — N183 Chronic kidney disease, stage 3 (moderate): Secondary | ICD-10-CM | POA: Diagnosis not present

## 2018-03-21 DIAGNOSIS — J449 Chronic obstructive pulmonary disease, unspecified: Secondary | ICD-10-CM | POA: Diagnosis not present

## 2018-03-21 DIAGNOSIS — M25562 Pain in left knee: Secondary | ICD-10-CM | POA: Diagnosis not present

## 2018-03-21 DIAGNOSIS — E119 Type 2 diabetes mellitus without complications: Secondary | ICD-10-CM | POA: Diagnosis not present

## 2018-03-21 DIAGNOSIS — M25561 Pain in right knee: Secondary | ICD-10-CM | POA: Diagnosis not present

## 2018-03-21 DIAGNOSIS — H04123 Dry eye syndrome of bilateral lacrimal glands: Secondary | ICD-10-CM | POA: Diagnosis not present

## 2018-03-21 DIAGNOSIS — M170 Bilateral primary osteoarthritis of knee: Secondary | ICD-10-CM | POA: Diagnosis not present

## 2018-03-21 DIAGNOSIS — R269 Unspecified abnormalities of gait and mobility: Secondary | ICD-10-CM | POA: Diagnosis not present

## 2018-03-21 DIAGNOSIS — M6281 Muscle weakness (generalized): Secondary | ICD-10-CM | POA: Diagnosis not present

## 2018-03-21 DIAGNOSIS — I10 Essential (primary) hypertension: Secondary | ICD-10-CM | POA: Diagnosis not present

## 2018-03-21 NOTE — Telephone Encounter (Signed)
Noted  

## 2018-03-21 NOTE — Telephone Encounter (Signed)
[place] for patient to see Dr Ninfa Linden?

## 2018-03-21 NOTE — Telephone Encounter (Signed)
yes

## 2018-03-21 NOTE — Telephone Encounter (Signed)
Patient called advised she is having right shoulder and right knee pain. Patient asked if she can be referred to Dr. Ninfa Linden to get the gel injection in both right shoulder and right knee? The number to contact patient is [phone]

## 2018-03-21 NOTE — Telephone Encounter (Signed)
Submitted VOB for SynviscOne, right knee. 

## 2018-03-21 NOTE — Telephone Encounter (Signed)
Dr Sharol Given ok'd for patient to see Dr Ninfa Linden. Can you get her approved for right knee gel injection before we get her in to see Dr Ninfa Linden for shoulder and knee?

## 2018-03-23 DIAGNOSIS — M170 Bilateral primary osteoarthritis of knee: Secondary | ICD-10-CM | POA: Diagnosis not present

## 2018-03-23 DIAGNOSIS — M25561 Pain in right knee: Secondary | ICD-10-CM | POA: Diagnosis not present

## 2018-03-23 DIAGNOSIS — R269 Unspecified abnormalities of gait and mobility: Secondary | ICD-10-CM | POA: Diagnosis not present

## 2018-03-23 DIAGNOSIS — M25562 Pain in left knee: Secondary | ICD-10-CM | POA: Diagnosis not present

## 2018-03-23 DIAGNOSIS — M6281 Muscle weakness (generalized): Secondary | ICD-10-CM | POA: Diagnosis not present

## 2018-03-23 DIAGNOSIS — I10 Essential (primary) hypertension: Secondary | ICD-10-CM | POA: Diagnosis not present

## 2018-03-23 DIAGNOSIS — E119 Type 2 diabetes mellitus without complications: Secondary | ICD-10-CM | POA: Diagnosis not present

## 2018-03-23 DIAGNOSIS — J449 Chronic obstructive pulmonary disease, unspecified: Secondary | ICD-10-CM | POA: Diagnosis not present

## 2018-03-23 DIAGNOSIS — N183 Chronic kidney disease, stage 3 (moderate): Secondary | ICD-10-CM | POA: Diagnosis not present

## 2018-03-28 ENCOUNTER — Telehealth (INDEPENDENT_AMBULATORY_CARE_PROVIDER_SITE_OTHER): Payer: Self-pay

## 2018-03-28 DIAGNOSIS — I10 Essential (primary) hypertension: Secondary | ICD-10-CM | POA: Diagnosis not present

## 2018-03-28 DIAGNOSIS — M6281 Muscle weakness (generalized): Secondary | ICD-10-CM | POA: Diagnosis not present

## 2018-03-28 DIAGNOSIS — M25562 Pain in left knee: Secondary | ICD-10-CM | POA: Diagnosis not present

## 2018-03-28 DIAGNOSIS — M25561 Pain in right knee: Secondary | ICD-10-CM | POA: Diagnosis not present

## 2018-03-28 DIAGNOSIS — R269 Unspecified abnormalities of gait and mobility: Secondary | ICD-10-CM | POA: Diagnosis not present

## 2018-03-28 DIAGNOSIS — E119 Type 2 diabetes mellitus without complications: Secondary | ICD-10-CM | POA: Diagnosis not present

## 2018-03-28 DIAGNOSIS — M170 Bilateral primary osteoarthritis of knee: Secondary | ICD-10-CM | POA: Diagnosis not present

## 2018-03-28 DIAGNOSIS — N183 Chronic kidney disease, stage 3 (moderate): Secondary | ICD-10-CM | POA: Diagnosis not present

## 2018-03-28 DIAGNOSIS — J449 Chronic obstructive pulmonary disease, unspecified: Secondary | ICD-10-CM | POA: Diagnosis not present

## 2018-03-28 NOTE — Telephone Encounter (Signed)
Tried calling patient to schedule appointment for gel injection, but phone number is disconnected.  Called patient's daughter [name] and left a VM advising her to have patient give [place] a call to schedule.  Patient approved for SynviscOne, right knee. [place] Patient will be responsible for 20% OOP No Co-pay No PA required

## 2018-03-29 DIAGNOSIS — M24571 Contracture, right ankle: Secondary | ICD-10-CM | POA: Diagnosis not present

## 2018-03-29 DIAGNOSIS — L600 Ingrowing nail: Secondary | ICD-10-CM | POA: Diagnosis not present

## 2018-03-29 DIAGNOSIS — M24572 Contracture, left ankle: Secondary | ICD-10-CM | POA: Diagnosis not present

## 2018-03-30 DIAGNOSIS — M6281 Muscle weakness (generalized): Secondary | ICD-10-CM | POA: Diagnosis not present

## 2018-03-30 DIAGNOSIS — E119 Type 2 diabetes mellitus without complications: Secondary | ICD-10-CM | POA: Diagnosis not present

## 2018-03-30 DIAGNOSIS — I10 Essential (primary) hypertension: Secondary | ICD-10-CM | POA: Diagnosis not present

## 2018-03-30 DIAGNOSIS — M25562 Pain in left knee: Secondary | ICD-10-CM | POA: Diagnosis not present

## 2018-03-30 DIAGNOSIS — M170 Bilateral primary osteoarthritis of knee: Secondary | ICD-10-CM | POA: Diagnosis not present

## 2018-03-30 DIAGNOSIS — N183 Chronic kidney disease, stage 3 (moderate): Secondary | ICD-10-CM | POA: Diagnosis not present

## 2018-03-30 DIAGNOSIS — R269 Unspecified abnormalities of gait and mobility: Secondary | ICD-10-CM | POA: Diagnosis not present

## 2018-03-30 DIAGNOSIS — J449 Chronic obstructive pulmonary disease, unspecified: Secondary | ICD-10-CM | POA: Diagnosis not present

## 2018-03-30 DIAGNOSIS — M25561 Pain in right knee: Secondary | ICD-10-CM | POA: Diagnosis not present

## 2018-03-30 NOTE — Telephone Encounter (Signed)
Patient left a voicemail stating it has been 2 weeks and no one has returned her calls. Patient left # [phone], returned call and it rings and rings, goes to busy signal, no vm available.

## 2018-04-04 DIAGNOSIS — M170 Bilateral primary osteoarthritis of knee: Secondary | ICD-10-CM | POA: Diagnosis not present

## 2018-04-04 DIAGNOSIS — J449 Chronic obstructive pulmonary disease, unspecified: Secondary | ICD-10-CM | POA: Diagnosis not present

## 2018-04-04 DIAGNOSIS — M25561 Pain in right knee: Secondary | ICD-10-CM | POA: Diagnosis not present

## 2018-04-04 DIAGNOSIS — M25562 Pain in left knee: Secondary | ICD-10-CM | POA: Diagnosis not present

## 2018-04-04 DIAGNOSIS — R269 Unspecified abnormalities of gait and mobility: Secondary | ICD-10-CM | POA: Diagnosis not present

## 2018-04-04 DIAGNOSIS — I10 Essential (primary) hypertension: Secondary | ICD-10-CM | POA: Diagnosis not present

## 2018-04-04 DIAGNOSIS — N183 Chronic kidney disease, stage 3 (moderate): Secondary | ICD-10-CM | POA: Diagnosis not present

## 2018-04-04 DIAGNOSIS — E119 Type 2 diabetes mellitus without complications: Secondary | ICD-10-CM | POA: Diagnosis not present

## 2018-04-04 DIAGNOSIS — M6281 Muscle weakness (generalized): Secondary | ICD-10-CM | POA: Diagnosis not present

## 2018-04-06 DIAGNOSIS — I10 Essential (primary) hypertension: Secondary | ICD-10-CM | POA: Diagnosis not present

## 2018-04-06 DIAGNOSIS — M6281 Muscle weakness (generalized): Secondary | ICD-10-CM | POA: Diagnosis not present

## 2018-04-06 DIAGNOSIS — M170 Bilateral primary osteoarthritis of knee: Secondary | ICD-10-CM | POA: Diagnosis not present

## 2018-04-06 DIAGNOSIS — N183 Chronic kidney disease, stage 3 (moderate): Secondary | ICD-10-CM | POA: Diagnosis not present

## 2018-04-06 DIAGNOSIS — R269 Unspecified abnormalities of gait and mobility: Secondary | ICD-10-CM | POA: Diagnosis not present

## 2018-04-06 DIAGNOSIS — M25562 Pain in left knee: Secondary | ICD-10-CM | POA: Diagnosis not present

## 2018-04-06 DIAGNOSIS — J449 Chronic obstructive pulmonary disease, unspecified: Secondary | ICD-10-CM | POA: Diagnosis not present

## 2018-04-06 DIAGNOSIS — E119 Type 2 diabetes mellitus without complications: Secondary | ICD-10-CM | POA: Diagnosis not present

## 2018-04-06 DIAGNOSIS — M25561 Pain in right knee: Secondary | ICD-10-CM | POA: Diagnosis not present

## 2018-04-07 ENCOUNTER — Other Ambulatory Visit: Payer: Self-pay | Admitting: *Deleted

## 2018-04-07 NOTE — Patient Outreach (Signed)
[facility]) Care Management  04/07/2018  Brittany Roberts 03/28/1941 [id number]   Call placed to member to follow up on current health status and life alert system.  No answer, unable to leave a message.  Will follow up within the next 4 business days.  Valente David, [place], MSN [place] [phone]

## 2018-04-11 ENCOUNTER — Encounter (HOSPITAL_COMMUNITY): Payer: Self-pay | Admitting: *Deleted

## 2018-04-11 ENCOUNTER — Other Ambulatory Visit: Payer: Self-pay

## 2018-04-11 ENCOUNTER — Emergency Department (HOSPITAL_COMMUNITY): Payer: Medicare Other

## 2018-04-11 ENCOUNTER — Emergency Department (HOSPITAL_COMMUNITY)
Admission: EM | Admit: 2018-04-11 | Discharge: 2018-04-11 | Disposition: A | Discharge status: Home / Self Care | Payer: Medicare Other | Attending: Emergency Medicine | Admitting: Emergency Medicine

## 2018-04-11 DIAGNOSIS — Z87891 Personal history of nicotine dependence: Secondary | ICD-10-CM | POA: Insufficient documentation

## 2018-04-11 DIAGNOSIS — I2510 Atherosclerotic heart disease of native coronary artery without angina pectoris: Secondary | ICD-10-CM | POA: Insufficient documentation

## 2018-04-11 DIAGNOSIS — E1122 Type 2 diabetes mellitus with diabetic chronic kidney disease: Secondary | ICD-10-CM | POA: Diagnosis not present

## 2018-04-11 DIAGNOSIS — N189 Chronic kidney disease, unspecified: Secondary | ICD-10-CM | POA: Insufficient documentation

## 2018-04-11 DIAGNOSIS — S92344A Nondisplaced fracture of fourth metatarsal bone, right foot, initial encounter for closed fracture: Secondary | ICD-10-CM | POA: Diagnosis not present

## 2018-04-11 DIAGNOSIS — I130 Hypertensive heart and chronic kidney disease with heart failure and stage 1 through stage 4 chronic kidney disease, or unspecified chronic kidney disease: Secondary | ICD-10-CM | POA: Diagnosis not present

## 2018-04-11 DIAGNOSIS — S99911A Unspecified injury of right ankle, initial encounter: Secondary | ICD-10-CM | POA: Diagnosis not present

## 2018-04-11 DIAGNOSIS — W010XXA Fall on same level from slipping, tripping and stumbling without subsequent striking against object, initial encounter: Secondary | ICD-10-CM | POA: Insufficient documentation

## 2018-04-11 DIAGNOSIS — Y9301 Activity, walking, marching and hiking: Secondary | ICD-10-CM | POA: Diagnosis not present

## 2018-04-11 DIAGNOSIS — I509 Heart failure, unspecified: Secondary | ICD-10-CM | POA: Diagnosis not present

## 2018-04-11 DIAGNOSIS — M25562 Pain in left knee: Secondary | ICD-10-CM | POA: Diagnosis not present

## 2018-04-11 DIAGNOSIS — M6281 Muscle weakness (generalized): Secondary | ICD-10-CM | POA: Diagnosis not present

## 2018-04-11 DIAGNOSIS — N183 Chronic kidney disease, stage 3 (moderate): Secondary | ICD-10-CM | POA: Diagnosis not present

## 2018-04-11 DIAGNOSIS — W19XXXA Unspecified fall, initial encounter: Secondary | ICD-10-CM | POA: Diagnosis not present

## 2018-04-11 DIAGNOSIS — I10 Essential (primary) hypertension: Secondary | ICD-10-CM | POA: Diagnosis not present

## 2018-04-11 DIAGNOSIS — Z794 Long term (current) use of insulin: Secondary | ICD-10-CM | POA: Diagnosis not present

## 2018-04-11 DIAGNOSIS — Z743 Need for continuous supervision: Secondary | ICD-10-CM | POA: Diagnosis not present

## 2018-04-11 DIAGNOSIS — Y929 Unspecified place or not applicable: Secondary | ICD-10-CM | POA: Insufficient documentation

## 2018-04-11 DIAGNOSIS — R52 Pain, unspecified: Secondary | ICD-10-CM | POA: Diagnosis not present

## 2018-04-11 DIAGNOSIS — S92334A Nondisplaced fracture of third metatarsal bone, right foot, initial encounter for closed fracture: Secondary | ICD-10-CM | POA: Diagnosis not present

## 2018-04-11 DIAGNOSIS — M170 Bilateral primary osteoarthritis of knee: Secondary | ICD-10-CM | POA: Diagnosis not present

## 2018-04-11 DIAGNOSIS — S92301A Fracture of unspecified metatarsal bone(s), right foot, initial encounter for closed fracture: Secondary | ICD-10-CM

## 2018-04-11 DIAGNOSIS — R609 Edema, unspecified: Secondary | ICD-10-CM | POA: Diagnosis not present

## 2018-04-11 DIAGNOSIS — R269 Unspecified abnormalities of gait and mobility: Secondary | ICD-10-CM | POA: Diagnosis not present

## 2018-04-11 DIAGNOSIS — J45909 Unspecified asthma, uncomplicated: Secondary | ICD-10-CM | POA: Diagnosis not present

## 2018-04-11 DIAGNOSIS — M25561 Pain in right knee: Secondary | ICD-10-CM | POA: Diagnosis not present

## 2018-04-11 DIAGNOSIS — J449 Chronic obstructive pulmonary disease, unspecified: Secondary | ICD-10-CM | POA: Diagnosis not present

## 2018-04-11 DIAGNOSIS — Y999 Unspecified external cause status: Secondary | ICD-10-CM | POA: Insufficient documentation

## 2018-04-11 DIAGNOSIS — E119 Type 2 diabetes mellitus without complications: Secondary | ICD-10-CM | POA: Diagnosis not present

## 2018-04-11 DIAGNOSIS — S92321A Displaced fracture of second metatarsal bone, right foot, initial encounter for closed fracture: Secondary | ICD-10-CM | POA: Diagnosis not present

## 2018-04-11 DIAGNOSIS — S99921A Unspecified injury of right foot, initial encounter: Secondary | ICD-10-CM | POA: Diagnosis present

## 2018-04-11 MED ORDER — ACETAMINOPHEN 325 MG PO TABS
650.0000 mg | ORAL_TABLET | Freq: Once | ORAL | Status: AC
  Administered 2018-04-11: 650 mg via ORAL
  Filled 2018-04-11: qty 2

## 2018-04-11 NOTE — ED Triage Notes (Signed)
Pt to xray

## 2018-04-11 NOTE — ED Notes (Signed)
Patient verbalizes understanding of discharge instructions. Opportunity for questioning and answers were provided. Pt discharged from ED. 

## 2018-04-11 NOTE — ED Provider Notes (Signed)
[facility] EMERGENCY DEPARTMENT Provider Note   CSN: [id number] Arrival date & time: 04/11/18  1548   History   Chief Complaint Chief Complaint  Patient presents with  . Ankle Pain    HPI Brittany Roberts is a 77 y.o. female.  HPI   77 year old female presents today with complaints of right foot and ankle pain.  Patient notes Brittany Roberts was trying to get in her car when her foot slipped causing forced plantarflexion of her foot.  Brittany Roberts notes immediate pain at the proximal foot and right lateral ankle.  Patient notes that her baseline ambulation status is reliant on rolling walker.  Patient notes Brittany Roberts has not ambulated since the incident.  Brittany Roberts notes no medications prior to arrival for pain.   Past Medical History:  Diagnosis Date  . Acute delirium 01/14/2017  . Anxiety   . Arthritis    "all over"  . Asthma   . Bipolar disorder ([place])   . Complication of anesthesia    "w/right foot OR they gave me too much and they couldn't get me woke"  . Congestive heart failure, unspecified   . Coronary artery calcification seen on CAT scan 01/14/2017  . Coronary artery disease    "I've got 1 stent" (06/30/2015)  . Depression   . Discoid lupus   . Fibromyalgia    "I've been told I have this; can't take Lyrica cause I'm allergic to it" (06/30/2015)  . GERD (gastroesophageal reflux disease)   . History of gout   . History of hiatal hernia    "real bad" (06/30/2015)  . Hypertension   . Hypothyroidism   . On home oxygen therapy    "2L q hs" (06/30/2015)  . OSA (obstructive sleep apnea)    "just use my oxygen; no mask" (06/30/2015)  . Other and unspecified hyperlipidemia   . Penetrating foot wound    left nonhealing foot wound on the dorsal surface  . Peripheral neuropathy   . Pneumonia "many of times"  . Refusal of blood transfusions as patient is Jehovah's Witness   . Renal failure, unspecified    "kidneys not working 100%" (06/30/2015)  . Sickle cell trait ([place])   . Systemic  lupus ([place])   . Thoracic aortic atherosclerosis ([place]) 01/14/2017  . Type II diabetes mellitus ([place])   . Unspecified vitamin D deficiency     Patient Active Problem List   Diagnosis Date Noted  . Chronic right shoulder pain 03/15/2018  . Bilateral primary osteoarthritis of knee 02/09/2018  . Pain in right ankle and joints of right foot 02/09/2018  . Acute exacerbation of congestive heart failure ([place]) 12/13/2017  . AKI (acute kidney injury) ([place]) 09/01/2017  . Gout, arthropathy 09/01/2017  . Localization-related symptomatic epilepsy and epileptic syndromes with complex partial seizures, not intractable, without status epilepticus ([place]) 04/25/2017  . Fibromyalgia 04/08/2017  . Allergic rhinitis 03/11/2017  . Cardiomyopathy ([place]) 01/22/2017  . Hyperlipidemia associated with type 2 diabetes mellitus ([place]) 01/22/2017  . Bipolar disorder ([place]) 01/22/2017  . Seizure ([place])   . Thoracic aortic atherosclerosis ([place]) 01/14/2017  . Coronary artery calcification seen on CAT scan 01/14/2017  . Acute delirium 01/14/2017  . Possible Tonic-clonic seizure disorder ([place]) 01/14/2017  . CVA (cerebral vascular accident) ([place]) 01/14/2017  . Altered mental status 01/13/2017  . Mastalgia 04/20/2016  . Bilateral leg edema 02/10/2016  . Hearing difficulty 10/30/2015  . Midline low back pain with right-sided sciatica 09/25/2015  . Angina pectoris ([place]) 06/30/2015  . Dysarthria  02/14/2015  . Schizophrenia, unspecified type ([place]) 10/25/2014  . Type 2 diabetes mellitus with complication ([place])   . Cephalalgia   . Hx of systemic lupus erythematosus (SLE) ([place])   . Thyroid mass 04/26/2014  . Constipation 12/10/2013  . Benign neoplasm of colon 12/10/2013  . Controlled type 2 diabetes with neuropathy ([place]) 11/27/2013  . Cystic kidney disease 11/09/2013  . Insulin dependent type 2 diabetes mellitus ([place]) 10/29/2013  . Hoarseness or changing voice 09/27/2013  . Dyskinesia, tardive 07/11/2013  . TMJ syndrome  02/16/2013  . Angina pectoris associated with type 2 diabetes mellitus ([place]) 08/08/2012  . CAD in native artery 08/08/2012  . Asthma, chronic, PRESUMED wiht MULTIFACTORIAL DYSPNEA 05/30/2012  . Insomnia 09/22/2011  . Mobility poor 02/02/2011  . Chronic kidney disease, stage III (moderate) ([place]) 05/13/2010  . Chronic combined systolic and diastolic CHF, NYHA class 2 ([place]) 03/31/2010  . COPD (chronic obstructive pulmonary disease) ([place]) 03/26/2010  . Morbid obesity ([place]) 02/27/2010  . Unstable gait 02/27/2010  . DM (diabetes mellitus), type 2 with neurological complications ([place]) 85/63/1497  . Osteoarthritis 04/25/2009  . Anemia 03/11/2007  . COGNITIVE IMPAIRMENT, MILD, SO STATED 03/11/2007  . RESTLESS LEG SYNDROME 03/11/2007  . Systemic lupus ([place]) 03/11/2007  . OSTEOARTHROSIS, GENERALIZED, MULTIPLE SITES 03/11/2007  . Hypothyroidism 03/07/2007  . DYSLIPIDEMIA 03/07/2007  . Depression 03/07/2007  . Essential hypertension 03/07/2007  . Coronary atherosclerosis 03/07/2007  . GERD 03/07/2007  . SLEEP APNEA 03/07/2007    Past Surgical History:  Procedure Laterality Date  . ANKLE ARTHROSCOPY WITH OPEN REDUCTION INTERNAL FIXATION (ORIF) Right 03/2011  . CARDIAC CATHETERIZATION N/A 07/01/2015   Procedure: Left Heart Cath and Coronary Angiography;  Surgeon: Adrian Prows, MD;  Location: [facility] LAB;  Service: Cardiovascular;  Laterality: N/A;  . CARDIAC CATHETERIZATION N/A 07/01/2015   Procedure: Intravascular Pressure Wire/FFR Study;  Surgeon: Adrian Prows, MD;  Location: [facility];  Service: Cardiovascular;  Laterality: N/A;  . CARPAL TUNNEL RELEASE Bilateral   . CATARACT EXTRACTION W/ INTRAOCULAR LENS  IMPLANT, BILATERAL Bilateral   . CHOLECYSTECTOMY OPEN    . COLONOSCOPY N/A 12/10/2013   Procedure: COLONOSCOPY;  Surgeon: Inda Castle, MD;  Location: [facility]OS[facility];  Service: Endoscopy;  Laterality: N/A;  . FRACTURE SURGERY    . INCISION AND DRAINAGE ABSCESS Left    foot  "under my little toe"  . KNEE LIGAMENT RECONSTRUCTION Right   . LEFT HEART CATHETERIZATION WITH CORONARY ANGIOGRAM N/A 08/08/2012   Procedure: LEFT HEART CATHETERIZATION WITH CORONARY ANGIOGRAM;  Surgeon: [name], MD;  Location: [facility] CATH LAB;  Service: Cardiovascular;  Laterality: N/A;  . NASAL SINUS SURGERY    . PERCUTANEOUS CORONARY STENT INTERVENTION (PCI-S) N/A 08/21/2012   Procedure: PERCUTANEOUS CORONARY STENT INTERVENTION (PCI-S);  Surgeon: [name], MD;  Location: [facility] CATH LAB;  Service: Cardiovascular;  Laterality: N/A;  . TUBAL LIGATION       OB History   None      Home Medications    Prior to Admission medications   Medication Sig Start Date End Date Taking? Authorizing Provider  ACCU-CHEK AVIVA PLUS test strip TEST THREE TIMES A DAY 02/21/18   [name], [name], MD  acetaminophen (TYLENOL) 325 MG tablet Take 650 mg by mouth every 6 (six) hours as needed for mild pain or moderate pain. 01/24/17   [provider]  albuterol (PROAIR HFA) 108 (90 Base) MCG/ACT inhaler inhale 2 puffs by mouth every 4 hours if needed USE ONLY IF YOU  ARE WHEEZING 11/22/17   [name], [name], MD  allopurinol (ZYLOPRIM) 100 MG tablet Take 1 tablet (100 mg total) by mouth daily. 10/18/17   [name], [name], MD  amLODipine (NORVASC) 10 MG tablet TAKE 1 TABLET BY MOUTH ONCE DAILY 03/13/18   [name], [name], MD  Ascorbic Acid (VITAMIN C PO) Take 1 tablet by mouth daily.    [provider]  aspirin EC 81 MG tablet Take 81 mg by mouth daily.     [provider]  atorvastatin (LIPITOR) 20 MG tablet TAKE 1 TABLET BY MOUTH DAILY 02/28/18   [name], [name], MD  B Complex Vitamins (VITAMIN B COMPLEX) TABS Take 1 tablet by mouth daily.    [provider]  Blood Glucose Monitoring Suppl (ACCU-CHEK AVIVA) device Use to test blood sugar three times a day. Dx. E11.9 11/15/17 11/15/18  [name], [name], MD  Cholecalciferol (VITAMIN D3) 5000 UNITS CAPS Take 5,000 Units by mouth  daily.    [provider]  diclofenac sodium (VOLTAREN) 1 % GEL Apply 4 g topically 4 (four) times daily. 03/15/18   [name], [name], MD  ferrous sulfate 325 (65 FE) MG tablet Take 325 mg by mouth daily with breakfast.    [provider]  furosemide (LASIX) 40 MG tablet TAKE 1 TABLET BY MOUTH ONCE DAILY 02/13/18   [name], [name], MD  gabapentin (NEURONTIN) 300 MG capsule Take 1 capsule (300 mg total) by mouth 2 (two) times daily. 08/30/16   Zenia Resides, MD  guaiFENesin (ROBITUSSIN) 100 MG/5ML liquid Take 200 mg by mouth 3 (three) times daily as needed for cough.    [provider]  Insulin NPH, Human,, Isophane, (HUMULIN N KWIKPEN) 100 UNIT/ML [employer] 30 UNITS SUBCUTANEOUSLY EVERY MORNING 10/18/17   [name], [name], MD  Insulin Pen Needle (PEN NEEDLES 31GX5/16") 31G X 8 MM MISC Check Blood sugar three times per day. Dx insulin using E11.8 07/15/17   [name], [name], MD  levETIRAcetam (KEPPRA) 500 MG tablet Take 1 tablet (500 mg total) by mouth 2 (two) times daily. 10/27/17   Cameron Sprang, MD  metoprolol tartrate (LOPRESSOR) 50 MG tablet TAKE 1 TABLET BY MOUTH EVERY DAY WITH OR IMME DIATELY FOLLOWING A MEAL FOR HYPERTENSION 02/13/18   [name], [name], MD  montelukast (SINGULAIR) 10 MG tablet Take 1 tablet (10 mg total) by mouth at bedtime. 07/19/17   [name], [name], MD  Multiple Vitamin (MULTIVITAMIN WITH MINERALS) TABS tablet Take 1 tablet by mouth daily.    [provider]  nitroGLYCERIN (NITROSTAT) 0.4 MG SL tablet Place 1 tablet (0.4 mg total) under the tongue every 5 (five) minutes as needed for chest pain. 05/13/16   Zenia Resides, MD  Olopatadine HCl (PAZEO) 0.7 % SOLN Apply 1 drop to eye daily. 11/02/17   [name], [name], MD  pantoprazole (PROTONIX) 40 MG tablet Take 1 tablet (40 mg total) by mouth daily. 10/18/17   [name], [name], MD  Polyethyl Glycol-Propyl Glycol (SYSTANE ULTRA OP) Place 1 drop into both eyes every 6 (six) hours as needed (dry  eyes).    [provider]  potassium chloride (K-DUR,KLOR-CON) 10 MEQ tablet Take 1 tablet by mouth 2 (two) times daily. 01/25/18   Adrian Prows, MD    Family History Family History  Problem Relation Age of Onset  . Heart disease Mother   . Diabetes Mother   . Clotting disorder Mother   . Pneumonia Father   .  Rheum arthritis Father   . Diabetes Sister   . Diabetes Sister   . Asthma Brother   . Cancer Brother   . Kidney disease Brother   . Lupus Son   . Heart disease Son   . Heart disease Daughter   . Colon cancer Neg Hx     Social History Social History   Tobacco Use  . Smoking status: Former Smoker    Packs/day: 3.00    Years: 40.00    Pack years: 120.00    Types: Cigarettes    Start date: 07/05/1960    Last attempt to quit: 07/05/1998    Years since quitting: 19.7  . Smokeless tobacco: Never Used  Substance Use Topics  . Alcohol use: No    Alcohol/week: 0.0 standard drinks    Comment: 06/30/2015 "used to drink a little alcohol on the weekends; not q weekend; nothing in years"  . Drug use: No     Allergies   Ace inhibitors; Isosorb dinitrate-hydralazine; Penicillins; Buspirone; Pregabalin; Ropinirole hydrochloride; and Amantadines   Review of Systems Review of Systems  All other systems reviewed and are negative.   Physical Exam Updated Vital Signs BP 134/74 (BP Location: Right Arm)   Pulse (!) 55   Temp 98.2 F (36.8 C) (Oral)   Resp 16   Ht 5' 3.5" (1.613 m)   Wt 121 kg   SpO2 96%   BMI 46.51 kg/m    Physical Exam  Constitutional: Brittany Roberts is oriented to person, place, and time. Brittany Roberts appears well-developed and well-nourished.  HENT:  Head: Normocephalic and atraumatic.  Eyes: Pupils are equal, round, and reactive to light. Conjunctivae are normal. Right eye exhibits no discharge. Left eye exhibits no discharge. No scleral icterus.  Neck: Normal range of motion. No JVD present. No tracheal deviation present.  Pulmonary/Chest: Effort normal. No  stridor.  Musculoskeletal:  Swelling noted along the right lateral malleolus and proximal foot, tenderness palpation of the proximal metatarsals from 2-5, no deformity, no open wounds-no significant laxity although exam difficult secondary to pain  No tenderness to the cervical and thoracic regions  Neurological: Brittany Roberts is alert and oriented to person, place, and time. Coordination normal.  Psychiatric: Brittany Roberts has a normal mood and affect. Her behavior is normal. Judgment and thought content normal.  Nursing note and vitals reviewed.   ED Treatments / Results  Labs (all labs ordered are listed, but only abnormal results are displayed) Labs Reviewed - No data to display  EKG None  Radiology Dg Ankle Complete Right  Result Date: 04/11/2018 CLINICAL DATA:  Fall. EXAM: RIGHT ANKLE - COMPLETE 3+ VIEW COMPARISON:  03/29/2011 FINDINGS: Chronic fracture distal fibula with plate and screw fixation. No acute fracture. Chronic fracture fragment medial malleolus. Mild posttraumatic arthropathy in the ankle. Moderate degenerative change in the midfoot with joint space narrowing and spurring. IMPRESSION: Negative for acute fracture. Electronically Signed   By: Franchot Gallo M.D.   On: 04/11/2018 17:05   Dg Foot Complete Right  Result Date: 04/11/2018 CLINICAL DATA:  Fall. EXAM: RIGHT FOOT COMPLETE - 3+ VIEW COMPARISON:  None. FINDINGS: Fractures at the base of the second, third, and fourth metatarsals without significant displacement. Mild degenerative change in the first MTP. Moderate degenerative change in the midfoot. Plate and screw fixation of chronic fibular fracture IMPRESSION: Acute nondisplaced fractures base of second, third, and fourth metatarsals Electronically Signed   By: Franchot Gallo M.D.   On: 04/11/2018 17:12    Procedures Procedures (including critical care  time)  Medications Ordered in ED Medications  acetaminophen (TYLENOL) tablet 650 mg (650 mg Oral Given 04/11/18 1802)      Initial Impression / Assessment and Plan / ED Course  I have reviewed the triage vital signs and the nursing notes.  Pertinent labs & imaging results that were available during my care of the patient were reviewed by me and considered in my medical decision making (see chart for details).      Labs:   Imaging: DG ankle complete. DG foot complete   Consults:  Therapeutics:  Discharge Meds:   Assessment/Plan: 10 YOF presents today with several Mehta tarsal fractures.  No open wounds no significant signs of discomfort.  Patient placed in a boot, Brittany Roberts does have close outpatient follow-up with orthopedics Artie scheduled for her shoulder, Brittany Roberts will discuss foot injury as well.  Brittany Roberts is given strict return precautions, Brittany Roberts verbalized understanding and agreement to today's plan had no further questions or concerns.    Final Clinical Impressions(s) / ED Diagnoses   Final diagnoses:  Closed nondisplaced fracture of metatarsal bone of right foot, unspecified metatarsal, initial encounter    ED Discharge Orders    None       Francee Gentile 04/12/18 1212    Orlie Dakin, MD 04/14/18 [phone]

## 2018-04-11 NOTE — Discharge Instructions (Addendum)
Please read attached information. If you experience any new or worsening signs or symptoms please return to the emergency room for evaluation. Please follow-up with your primary care provider or specialist as discussed.  °

## 2018-04-11 NOTE — ED Triage Notes (Signed)
Pt fell while getting into car. Pt reports falling on to Rt ankle

## 2018-04-12 ENCOUNTER — Other Ambulatory Visit: Payer: Self-pay | Admitting: *Deleted

## 2018-04-12 ENCOUNTER — Ambulatory Visit: Payer: Self-pay | Admitting: *Deleted

## 2018-04-12 ENCOUNTER — Emergency Department (HOSPITAL_COMMUNITY): Payer: Medicare Other

## 2018-04-12 ENCOUNTER — Emergency Department (HOSPITAL_COMMUNITY)
Admission: EM | Admit: 2018-04-12 | Discharge: 2018-04-14 | Payer: Medicare Other | Attending: Emergency Medicine | Admitting: Emergency Medicine

## 2018-04-12 ENCOUNTER — Encounter (HOSPITAL_COMMUNITY): Payer: Self-pay

## 2018-04-12 ENCOUNTER — Other Ambulatory Visit: Payer: Self-pay

## 2018-04-12 ENCOUNTER — Ambulatory Visit (INDEPENDENT_AMBULATORY_CARE_PROVIDER_SITE_OTHER): Payer: Medicare Other | Admitting: Orthopaedic Surgery

## 2018-04-12 DIAGNOSIS — R52 Pain, unspecified: Secondary | ICD-10-CM | POA: Diagnosis not present

## 2018-04-12 DIAGNOSIS — W1839XA Other fall on same level, initial encounter: Secondary | ICD-10-CM | POA: Diagnosis not present

## 2018-04-12 DIAGNOSIS — E039 Hypothyroidism, unspecified: Secondary | ICD-10-CM | POA: Diagnosis not present

## 2018-04-12 DIAGNOSIS — E119 Type 2 diabetes mellitus without complications: Secondary | ICD-10-CM | POA: Insufficient documentation

## 2018-04-12 DIAGNOSIS — I959 Hypotension, unspecified: Secondary | ICD-10-CM | POA: Diagnosis not present

## 2018-04-12 DIAGNOSIS — J449 Chronic obstructive pulmonary disease, unspecified: Secondary | ICD-10-CM | POA: Insufficient documentation

## 2018-04-12 DIAGNOSIS — Z79899 Other long term (current) drug therapy: Secondary | ICD-10-CM | POA: Diagnosis not present

## 2018-04-12 DIAGNOSIS — Y9389 Activity, other specified: Secondary | ICD-10-CM | POA: Diagnosis not present

## 2018-04-12 DIAGNOSIS — Z743 Need for continuous supervision: Secondary | ICD-10-CM | POA: Diagnosis not present

## 2018-04-12 DIAGNOSIS — I110 Hypertensive heart disease with heart failure: Secondary | ICD-10-CM | POA: Insufficient documentation

## 2018-04-12 DIAGNOSIS — I509 Heart failure, unspecified: Secondary | ICD-10-CM | POA: Insufficient documentation

## 2018-04-12 DIAGNOSIS — Z87891 Personal history of nicotine dependence: Secondary | ICD-10-CM | POA: Insufficient documentation

## 2018-04-12 DIAGNOSIS — R296 Repeated falls: Secondary | ICD-10-CM

## 2018-04-12 DIAGNOSIS — Z7982 Long term (current) use of aspirin: Secondary | ICD-10-CM | POA: Diagnosis not present

## 2018-04-12 DIAGNOSIS — M1711 Unilateral primary osteoarthritis, right knee: Secondary | ICD-10-CM | POA: Insufficient documentation

## 2018-04-12 DIAGNOSIS — W19XXXA Unspecified fall, initial encounter: Secondary | ICD-10-CM | POA: Diagnosis not present

## 2018-04-12 DIAGNOSIS — Y999 Unspecified external cause status: Secondary | ICD-10-CM | POA: Insufficient documentation

## 2018-04-12 DIAGNOSIS — Y92002 Bathroom of unspecified non-institutional (private) residence single-family (private) house as the place of occurrence of the external cause: Secondary | ICD-10-CM | POA: Insufficient documentation

## 2018-04-12 DIAGNOSIS — I2510 Atherosclerotic heart disease of native coronary artery without angina pectoris: Secondary | ICD-10-CM | POA: Diagnosis not present

## 2018-04-12 DIAGNOSIS — M25561 Pain in right knee: Secondary | ICD-10-CM | POA: Diagnosis not present

## 2018-04-12 LAB — CBG MONITORING, ED: Glucose-Capillary: 131 mg/dL — ABNORMAL HIGH (ref 70–99)

## 2018-04-12 MED ORDER — ATORVASTATIN CALCIUM 20 MG PO TABS
20.0000 mg | ORAL_TABLET | Freq: Every day | ORAL | Status: DC
  Administered 2018-04-13 – 2018-04-14 (×2): 20 mg via ORAL
  Filled 2018-04-12 (×2): qty 1

## 2018-04-12 MED ORDER — POTASSIUM CHLORIDE CRYS ER 20 MEQ PO TBCR
10.0000 meq | EXTENDED_RELEASE_TABLET | Freq: Two times a day (BID) | ORAL | Status: DC
  Administered 2018-04-12 – 2018-04-14 (×4): 10 meq via ORAL
  Filled 2018-04-12 (×4): qty 1

## 2018-04-12 MED ORDER — PANTOPRAZOLE SODIUM 40 MG PO TBEC
40.0000 mg | DELAYED_RELEASE_TABLET | Freq: Every day | ORAL | Status: DC
  Administered 2018-04-13 – 2018-04-14 (×2): 40 mg via ORAL
  Filled 2018-04-12 (×2): qty 1

## 2018-04-12 MED ORDER — METOPROLOL TARTRATE 25 MG PO TABS
50.0000 mg | ORAL_TABLET | Freq: Every day | ORAL | Status: DC
  Administered 2018-04-13 – 2018-04-14 (×2): 50 mg via ORAL
  Filled 2018-04-12 (×2): qty 2

## 2018-04-12 MED ORDER — GABAPENTIN 300 MG PO CAPS
300.0000 mg | ORAL_CAPSULE | Freq: Two times a day (BID) | ORAL | Status: DC
  Administered 2018-04-12 – 2018-04-14 (×4): 300 mg via ORAL
  Filled 2018-04-12 (×4): qty 1

## 2018-04-12 MED ORDER — AMLODIPINE BESYLATE 5 MG PO TABS
10.0000 mg | ORAL_TABLET | Freq: Every day | ORAL | Status: DC
  Administered 2018-04-13 – 2018-04-14 (×2): 10 mg via ORAL
  Filled 2018-04-12 (×2): qty 2

## 2018-04-12 MED ORDER — LEVETIRACETAM 500 MG PO TABS
500.0000 mg | ORAL_TABLET | Freq: Two times a day (BID) | ORAL | Status: DC
  Administered 2018-04-12 – 2018-04-14 (×4): 500 mg via ORAL
  Filled 2018-04-12 (×4): qty 1

## 2018-04-12 MED ORDER — ALBUTEROL SULFATE HFA 108 (90 BASE) MCG/ACT IN AERS
2.0000 | INHALATION_SPRAY | RESPIRATORY_TRACT | Status: DC | PRN
  Administered 2018-04-14: 2 via RESPIRATORY_TRACT
  Filled 2018-04-12: qty 6.7

## 2018-04-12 MED ORDER — ASPIRIN EC 81 MG PO TBEC
81.0000 mg | DELAYED_RELEASE_TABLET | Freq: Every day | ORAL | Status: DC
  Administered 2018-04-13 – 2018-04-14 (×2): 81 mg via ORAL
  Filled 2018-04-12 (×2): qty 1

## 2018-04-12 MED ORDER — ACETAMINOPHEN 325 MG PO TABS
650.0000 mg | ORAL_TABLET | Freq: Four times a day (QID) | ORAL | Status: DC | PRN
  Administered 2018-04-12 – 2018-04-13 (×3): 650 mg via ORAL
  Filled 2018-04-12 (×3): qty 2

## 2018-04-12 MED ORDER — FUROSEMIDE 20 MG PO TABS
40.0000 mg | ORAL_TABLET | Freq: Every day | ORAL | Status: DC
  Administered 2018-04-13 – 2018-04-14 (×2): 40 mg via ORAL
  Filled 2018-04-12 (×2): qty 2

## 2018-04-12 MED ORDER — INSULIN ASPART 100 UNIT/ML ~~LOC~~ SOLN
0.0000 [IU] | Freq: Three times a day (TID) | SUBCUTANEOUS | Status: DC
  Administered 2018-04-13: 2 [IU] via SUBCUTANEOUS
  Administered 2018-04-13: 3 [IU] via SUBCUTANEOUS
  Filled 2018-04-12 (×2): qty 1

## 2018-04-12 NOTE — Progress Notes (Signed)
CSW made aware by [facility] that pt is in need of possible SNF placement. CSW awaiting PT evaluation as PT evaluation needed to continue with further placement at this time.   Virgie Dad Marayah Higdon, MSW, [place] [facility] Department Clinical Social Worker [phone]

## 2018-04-12 NOTE — Patient Outreach (Signed)
[facility]) Care Management  04/12/2018  Brittany Roberts 12/15/1940 [id number]   Scheduled outreach to member today however noted she presented to ED yesterday after a fall and again today after 2 falls at home.  Per ED notes, member may discharge to SNF.  Will placed referral to LCSW, this care manager will follow up pending disposition.  Valente David, [place], MSN [place] [phone]

## 2018-04-12 NOTE — ED Notes (Signed)
P[employer] replaced.

## 2018-04-12 NOTE — ED Notes (Signed)
Pt eating carb modified dinner @ bedside.

## 2018-04-12 NOTE — ED Notes (Signed)
Pts brief changed  

## 2018-04-12 NOTE — ED Triage Notes (Signed)
Per [facility], pt arrives from home with c/o two mechanical falls today. Pt denies dizziness/loss of consciousness. Pt endorses fall while transferring to commode. Pt was seen here on 10/8 for a fall in which she was diagnosed with fractures to right foot. Pt endorsing pain in right knee which was present yesterday as well. Pt denies hitting head or any other new injuries. Pt is alert and oriented.

## 2018-04-12 NOTE — ED Notes (Signed)
Patient transported to X-ray 

## 2018-04-12 NOTE — ED Notes (Signed)
Provider at bedside

## 2018-04-12 NOTE — ED Provider Notes (Signed)
[facility] EMERGENCY DEPARTMENT Provider Note   CSN: [id number] Arrival date & time: 04/12/18  1057     History   Chief Complaint Chief Complaint  Patient presents with  . Fall  . Knee Pain    HPI Brittany Roberts is a 77 y.o. female with a pmhx of obesity, HTN, bipolar disorder, CAD, fibromyalgia, OSA who presented to the ED today complaining of R knee pain. Pt reports taht she had a mechanical fall yesterday. She was seen in the ED for this and had Xray that showed acute non-displaced fractures base of 2nd, 3rd and 4th metatarsals. She did not have imaging of her knee at that time. Pt reports that her knee was hurting prior to the fall yesterday but now feels very weak. She had boot on R foot. Today while trying to ambulated with walker to the bathroom her knee "gave out" and she fell down to her butt. Her home health aid corroborated story. No head injury. PT lives alone and has home health for only 3 hours per day. Daughter is concerned that pt not be able to live alone anymore.  HPI  Past Medical History:  Diagnosis Date  . Acute delirium 01/14/2017  . Anxiety   . Arthritis    "all over"  . Asthma   . Bipolar disorder ([place])   . Complication of anesthesia    "w/right foot OR they gave me too much and they couldn't get me woke"  . Congestive heart failure, unspecified   . Coronary artery calcification seen on CAT scan 01/14/2017  . Coronary artery disease    "I've got 1 stent" (06/30/2015)  . Depression   . Discoid lupus   . Fibromyalgia    "I've been told I have this; can't take Lyrica cause I'm allergic to it" (06/30/2015)  . GERD (gastroesophageal reflux disease)   . History of gout   . History of hiatal hernia    "real bad" (06/30/2015)  . Hypertension   . Hypothyroidism   . On home oxygen therapy    "2L q hs" (06/30/2015)  . OSA (obstructive sleep apnea)    "just use my oxygen; no mask" (06/30/2015)  . Other and unspecified hyperlipidemia   .  Penetrating foot wound    left nonhealing foot wound on the dorsal surface  . Peripheral neuropathy   . Pneumonia "many of times"  . Refusal of blood transfusions as patient is Jehovah's Witness   . Renal failure, unspecified    "kidneys not working 100%" (06/30/2015)  . Sickle cell trait ([place])   . Systemic lupus ([place])   . Thoracic aortic atherosclerosis ([place]) 01/14/2017  . Type II diabetes mellitus ([place])   . Unspecified vitamin D deficiency     Patient Active Problem List   Diagnosis Date Noted  . Chronic right shoulder pain 03/15/2018  . Bilateral primary osteoarthritis of knee 02/09/2018  . Pain in right ankle and joints of right foot 02/09/2018  . Acute exacerbation of congestive heart failure ([place]) 12/13/2017  . AKI (acute kidney injury) ([place]) 09/01/2017  . Gout, arthropathy 09/01/2017  . Localization-related symptomatic epilepsy and epileptic syndromes with complex partial seizures, not intractable, without status epilepticus ([place]) 04/25/2017  . Fibromyalgia 04/08/2017  . Allergic rhinitis 03/11/2017  . Cardiomyopathy ([place]) 01/22/2017  . Hyperlipidemia associated with type 2 diabetes mellitus ([place]) 01/22/2017  . Bipolar disorder ([place]) 01/22/2017  . Seizure ([place])   . Thoracic aortic atherosclerosis ([place]) 01/14/2017  . Coronary artery calcification  seen on CAT scan 01/14/2017  . Acute delirium 01/14/2017  . Possible Tonic-clonic seizure disorder ([place]) 01/14/2017  . CVA (cerebral vascular accident) ([place]) 01/14/2017  . Altered mental status 01/13/2017  . Mastalgia 04/20/2016  . Bilateral leg edema 02/10/2016  . Hearing difficulty 10/30/2015  . Midline low back pain with right-sided sciatica 09/25/2015  . Angina pectoris ([place]) 06/30/2015  . Dysarthria 02/14/2015  . Schizophrenia, unspecified type ([place]) 10/25/2014  . Type 2 diabetes mellitus with complication ([place])   . Cephalalgia   . Hx of systemic lupus erythematosus (SLE) ([place])   . Thyroid mass 04/26/2014  .  Constipation 12/10/2013  . Benign neoplasm of colon 12/10/2013  . Controlled type 2 diabetes with neuropathy ([place]) 11/27/2013  . Cystic kidney disease 11/09/2013  . Insulin dependent type 2 diabetes mellitus ([place]) 10/29/2013  . Hoarseness or changing voice 09/27/2013  . Dyskinesia, tardive 07/11/2013  . TMJ syndrome 02/16/2013  . Angina pectoris associated with type 2 diabetes mellitus ([place]) 08/08/2012  . CAD in native artery 08/08/2012  . Asthma, chronic, PRESUMED wiht MULTIFACTORIAL DYSPNEA 05/30/2012  . Insomnia 09/22/2011  . Mobility poor 02/02/2011  . Chronic kidney disease, stage III (moderate) ([place]) 05/13/2010  . Chronic combined systolic and diastolic CHF, NYHA class 2 ([place]) 03/31/2010  . COPD (chronic obstructive pulmonary disease) ([place]) 03/26/2010  . Morbid obesity ([place]) 02/27/2010  . Unstable gait 02/27/2010  . DM (diabetes mellitus), type 2 with neurological complications ([place]) 16/04/9603  . Osteoarthritis 04/25/2009  . Anemia 03/11/2007  . COGNITIVE IMPAIRMENT, MILD, SO STATED 03/11/2007  . RESTLESS LEG SYNDROME 03/11/2007  . Systemic lupus ([place]) 03/11/2007  . OSTEOARTHROSIS, GENERALIZED, MULTIPLE SITES 03/11/2007  . Hypothyroidism 03/07/2007  . DYSLIPIDEMIA 03/07/2007  . Depression 03/07/2007  . Essential hypertension 03/07/2007  . Coronary atherosclerosis 03/07/2007  . GERD 03/07/2007  . SLEEP APNEA 03/07/2007    Past Surgical History:  Procedure Laterality Date  . ANKLE ARTHROSCOPY WITH OPEN REDUCTION INTERNAL FIXATION (ORIF) Right 03/2011  . CARDIAC CATHETERIZATION N/A 07/01/2015   Procedure: Left Heart Cath and Coronary Angiography;  Surgeon: Adrian Prows, MD;  Location: [facility];  Service: Cardiovascular;  Laterality: N/A;  . CARDIAC CATHETERIZATION N/A 07/01/2015   Procedure: Intravascular Pressure Wire/FFR Study;  Surgeon: Adrian Prows, MD;  Location: [facility];  Service: Cardiovascular;  Laterality: N/A;  . CARPAL TUNNEL RELEASE Bilateral     . CATARACT EXTRACTION W/ INTRAOCULAR LENS  IMPLANT, BILATERAL Bilateral   . CHOLECYSTECTOMY OPEN    . COLONOSCOPY N/A 12/10/2013   Procedure: COLONOSCOPY;  Surgeon: Inda Castle, MD;  Location: [facility];  Service: Endoscopy;  Laterality: N/A;  . FRACTURE SURGERY    . INCISION AND DRAINAGE ABSCESS Left    foot "under my little toe"  . KNEE LIGAMENT RECONSTRUCTION Right   . LEFT HEART CATHETERIZATION WITH CORONARY ANGIOGRAM N/A 08/08/2012   Procedure: LEFT HEART CATHETERIZATION WITH CORONARY ANGIOGRAM;  Surgeon: [name], MD;  Location: [facility] CATH LAB;  Service: Cardiovascular;  Laterality: N/A;  . NASAL SINUS SURGERY    . PERCUTANEOUS CORONARY STENT INTERVENTION (PCI-S) N/A 08/21/2012   Procedure: PERCUTANEOUS CORONARY STENT INTERVENTION (PCI-S);  Surgeon: [name], MD;  Location: [facility] CATH LAB;  Service: Cardiovascular;  Laterality: N/A;  . TUBAL LIGATION       OB History   None      Home Medications    Prior to Admission medications   Medication Sig Start Date End Date Taking? Authorizing Provider  ACCU-CHEK AVIVA  PLUS test strip TEST THREE TIMES A DAY 02/21/18   [name], [name], MD  acetaminophen (TYLENOL) 325 MG tablet Take 650 mg by mouth every 6 (six) hours as needed for mild pain or moderate pain. 01/24/17   [provider]  albuterol (PROAIR HFA) 108 (90 Base) MCG/ACT inhaler inhale 2 puffs by mouth every 4 hours if needed USE ONLY IF YOU ARE WHEEZING 11/22/17   [name], [name], MD  allopurinol (ZYLOPRIM) 100 MG tablet Take 1 tablet (100 mg total) by mouth daily. 10/18/17   [name], [name], MD  amLODipine (NORVASC) 10 MG tablet TAKE 1 TABLET BY MOUTH ONCE DAILY 03/13/18   [name], [name], MD  Ascorbic Acid (VITAMIN C PO) Take 1 tablet by mouth daily.    [provider]  aspirin EC 81 MG tablet Take 81 mg by mouth daily.     [provider]  atorvastatin (LIPITOR) 20 MG tablet TAKE 1 TABLET BY MOUTH DAILY 02/28/18   [name], [name], MD  B  Complex Vitamins (VITAMIN B COMPLEX) TABS Take 1 tablet by mouth daily.    [provider]  Blood Glucose Monitoring Suppl (ACCU-CHEK AVIVA) device Use to test blood sugar three times a day. Dx. E11.9 11/15/17 11/15/18  [name], [name], MD  Cholecalciferol (VITAMIN D3) 5000 UNITS CAPS Take 5,000 Units by mouth daily.    [provider]  diclofenac sodium (VOLTAREN) 1 % GEL Apply 4 g topically 4 (four) times daily. 03/15/18   [name], [name], MD  ferrous sulfate 325 (65 FE) MG tablet Take 325 mg by mouth daily with breakfast.    [provider]  furosemide (LASIX) 40 MG tablet TAKE 1 TABLET BY MOUTH ONCE DAILY 02/13/18   [name], [name], MD  gabapentin (NEURONTIN) 300 MG capsule Take 1 capsule (300 mg total) by mouth 2 (two) times daily. 08/30/16   Zenia Resides, MD  guaiFENesin (ROBITUSSIN) 100 MG/5ML liquid Take 200 mg by mouth 3 (three) times daily as needed for cough.    [provider]  Insulin NPH, Human,, Isophane, (HUMULIN N KWIKPEN) 100 UNIT/ML [employer] 30 UNITS SUBCUTANEOUSLY EVERY MORNING 10/18/17   [name], [name], MD  Insulin Pen Needle (PEN NEEDLES 31GX5/16") 31G X 8 MM MISC Check Blood sugar three times per day. Dx insulin using E11.8 07/15/17   [name], [name], MD  levETIRAcetam (KEPPRA) 500 MG tablet Take 1 tablet (500 mg total) by mouth 2 (two) times daily. 10/27/17   Cameron Sprang, MD  metoprolol tartrate (LOPRESSOR) 50 MG tablet TAKE 1 TABLET BY MOUTH EVERY DAY WITH OR IMME DIATELY FOLLOWING A MEAL FOR HYPERTENSION 02/13/18   [name], [name], MD  montelukast (SINGULAIR) 10 MG tablet Take 1 tablet (10 mg total) by mouth at bedtime. 07/19/17   [name], [name], MD  Multiple Vitamin (MULTIVITAMIN WITH MINERALS) TABS tablet Take 1 tablet by mouth daily.    [provider]  nitroGLYCERIN (NITROSTAT) 0.4 MG SL tablet Place 1 tablet (0.4 mg total) under the tongue every 5 (five) minutes as needed for chest pain. 05/13/16   Zenia Resides, MD    Olopatadine HCl (PAZEO) 0.7 % SOLN Apply 1 drop to eye daily. 11/02/17   [name], [name], MD  pantoprazole (PROTONIX) 40 MG tablet Take 1 tablet (40 mg total) by mouth daily. 10/18/17   [name], [name], MD  Polyethyl Glycol-Propyl Glycol (SYSTANE ULTRA OP) Place 1 drop into both eyes every 6 (six) hours as  needed (dry eyes).    [provider]  potassium chloride (K-DUR,KLOR-CON) 10 MEQ tablet Take 1 tablet by mouth 2 (two) times daily. 01/25/18   Adrian Prows, MD    Family History Family History  Problem Relation Age of Onset  . Heart disease Mother   . Diabetes Mother   . Clotting disorder Mother   . Pneumonia Father   . Rheum arthritis Father   . Diabetes Sister   . Diabetes Sister   . Asthma Brother   . Cancer Brother   . Kidney disease Brother   . Lupus Son   . Heart disease Son   . Heart disease Daughter   . Colon cancer Neg Hx     Social History Social History   Tobacco Use  . Smoking status: Former Smoker    Packs/day: 3.00    Years: 40.00    Pack years: 120.00    Types: Cigarettes    Start date: 07/05/1960    Last attempt to quit: 07/05/1998    Years since quitting: 19.7  . Smokeless tobacco: Never Used  Substance Use Topics  . Alcohol use: No    Alcohol/week: 0.0 standard drinks    Comment: 06/30/2015 "used to drink a little alcohol on the weekends; not q weekend; nothing in years"  . Drug use: No     Allergies   Ace inhibitors; Isosorb dinitrate-hydralazine; Penicillins; Buspirone; Pregabalin; Ropinirole hydrochloride; and Amantadines   Review of Systems Review of Systems  All other systems reviewed and are negative.    Physical Exam Updated Vital Signs BP (!) 128/45 (BP Location: Left Arm)   Pulse (!) 58   Temp 98.7 F (37.1 C) (Oral)   Resp 16   Ht 5\' 3"  (1.6 m)   Wt 121.1 kg   SpO2 96%   BMI 47.30 kg/m   Physical Exam  Constitutional: She is oriented to person, place, and time. No distress.  Elderly, obese  HENT:  Head:  Normocephalic and atraumatic.  Mouth/Throat: No oropharyngeal exudate.  Eyes: Pupils are equal, round, and reactive to light. Conjunctivae and EOM are normal. Right eye exhibits no discharge. Left eye exhibits no discharge. No scleral icterus.  Cardiovascular: Normal rate, regular rhythm, normal heart sounds and intact distal pulses. Exam reveals no gallop and no friction rub.  No murmur heard. Pulmonary/Chest: Effort normal and breath sounds normal. No respiratory distress. She has no wheezes. She has no rales. She exhibits no tenderness.  Abdominal: Soft. She exhibits no distension. There is no tenderness. There is no guarding.  Musculoskeletal: She exhibits no edema.  TTP over anterior knee, increased pain with both flexion and extension. NO obvious bony deformity of knee, negative ballottement test.  Hard foot/ankle brace over R foot.  Neurological: She is alert and oriented to person, place, and time.  Skin: Skin is warm and dry. No rash noted. She is not diaphoretic. No erythema. No pallor.  Psychiatric: She has a normal mood and affect. Her behavior is normal.  Nursing note and vitals reviewed.    ED Treatments / Results  Labs (all labs ordered are listed, but only abnormal results are displayed) Labs Reviewed - No data to display  EKG None  Radiology Dg Ankle Complete Right  Result Date: 04/11/2018 CLINICAL DATA:  Fall. EXAM: RIGHT ANKLE - COMPLETE 3+ VIEW COMPARISON:  03/29/2011 FINDINGS: Chronic fracture distal fibula with plate and screw fixation. No acute fracture. Chronic fracture fragment medial malleolus. Mild posttraumatic arthropathy in the ankle. Moderate degenerative  change in the midfoot with joint space narrowing and spurring. IMPRESSION: Negative for acute fracture. Electronically Signed   By: Franchot Gallo M.D.   On: 04/11/2018 17:05   Dg Knee Complete 4 Views Right  Result Date: 04/12/2018 CLINICAL DATA:  Acute right knee pain after a fall yesterday. Initial  encounter. EXAM: RIGHT KNEE - COMPLETE 4+ VIEW COMPARISON:  02/09/2018 FINDINGS: No acute fracture, dislocation, or knee joint effusion is identified. Moderate medial compartment joint space narrowing and tricompartmental osteophytosis are again seen. No atherosclerotic vascular calcifications are noted. IMPRESSION: 1. No acute osseous abnormality identified. 2. Tricompartmental osteoarthrosis greatest medially. Electronically Signed   By: Logan Bores M.D.   On: 04/12/2018 13:12   Dg Foot Complete Right  Result Date: 04/11/2018 CLINICAL DATA:  Fall. EXAM: RIGHT FOOT COMPLETE - 3+ VIEW COMPARISON:  None. FINDINGS: Fractures at the base of the second, third, and fourth metatarsals without significant displacement. Mild degenerative change in the first MTP. Moderate degenerative change in the midfoot. Plate and screw fixation of chronic fibular fracture IMPRESSION: Acute nondisplaced fractures base of second, third, and fourth metatarsals Electronically Signed   By: Franchot Gallo M.D.   On: 04/11/2018 17:12    Procedures Procedures (including critical care time)  Medications Ordered in ED Medications - No data to display   Initial Impression / Assessment and Plan / ED Course  I have reviewed the triage vital signs and the nursing notes.  Pertinent labs & imaging results that were available during my care of the patient were reviewed by me and considered in my medical decision making (see chart for details).     77 year old female with morbid obesity presented to the ED today complaining of right knee pain. Yesterday sustained a mechanical fall which resulted in acute nondisplaced fractures of second third and fourth metatarsal bases. She was seen in the ED for this and she was given a foot and ankle brace, weightbearing. Today while attempting to transfer to the commode she suffered another fall. She feels like her right knee is weak, painful and gave out on her. She fell to her bottom. The home  health aide and the daughter report that she has had multiple falls similar to this related to her right knee. The patient currently lives alone and the daughter is very concerned that she is unable to live safely by herself. She is interested in having her placed in a there are a nursing facility or rehabilitation facility. Patient is unable to ambulate here in the ED. X-ray of right knee shows tricompartmental osteoarthritis. No acute fracture.  Spoke with case management, they agree that the patient can be placed in a facility however this may take about 24 hours. Patient does not meet inpatient criteria currently but can wait here in the ED until placement tomorrow.  Final Clinical Impressions(s) / ED Diagnoses   Final diagnoses:  None    ED Discharge Orders    None       Carlos Levering, PA-C 04/12/18 [place], MD 04/12/18 1555

## 2018-04-12 NOTE — NC FL2 (Signed)
[place] LEVEL OF CARE SCREENING TOOL     IDENTIFICATION  Patient Name: Brittany Roberts Birthdate: Feb 29, 1941 Sex: female Admission Date (Current Location): 04/12/2018  [facility] and [place] Number:  Herbalist and Address:  [facility], [place] [address] [place], [place] [address]      Provider Number: [id number]  Attending Physician Name and Address:  Jola Schmidt, MD  Relative Name and Phone Number:       Current Level of Care: Hospital Recommended Level of Care: [facility] Prior Approval Number:    Date Approved/Denied:   PASRR Number:   [id number]   Discharge Plan: SNF    Current Diagnoses: Patient Active Problem List   Diagnosis Date Noted  . Chronic right shoulder pain 03/15/2018  . Bilateral primary osteoarthritis of knee 02/09/2018  . Pain in right ankle and joints of right foot 02/09/2018  . Acute exacerbation of congestive heart failure ([place]) 12/13/2017  . AKI (acute kidney injury) ([place]) 09/01/2017  . Gout, arthropathy 09/01/2017  . Localization-related symptomatic epilepsy and epileptic syndromes with complex partial seizures, not intractable, without status epilepticus ([place]) 04/25/2017  . Fibromyalgia 04/08/2017  . Allergic rhinitis 03/11/2017  . Cardiomyopathy ([place]) 01/22/2017  . Hyperlipidemia associated with type 2 diabetes mellitus ([place]) 01/22/2017  . Bipolar disorder ([place]) 01/22/2017  . Seizure ([place])   . Thoracic aortic atherosclerosis ([place]) 01/14/2017  . Coronary artery calcification seen on CAT scan 01/14/2017  . Acute delirium 01/14/2017  . Possible Tonic-clonic seizure disorder ([place]) 01/14/2017  . CVA (cerebral vascular accident) ([place]) 01/14/2017  . Altered mental status 01/13/2017  . Mastalgia 04/20/2016  . Bilateral leg edema 02/10/2016  . Hearing difficulty 10/30/2015  . Midline low back pain with right-sided sciatica 09/25/2015  . Angina pectoris ([place]) 06/30/2015  . Dysarthria  02/14/2015  . Schizophrenia, unspecified type ([place]) 10/25/2014  . Type 2 diabetes mellitus with complication ([place])   . Cephalalgia   . Hx of systemic lupus erythematosus (SLE) ([place])   . Thyroid mass 04/26/2014  . Constipation 12/10/2013  . Benign neoplasm of colon 12/10/2013  . Controlled type 2 diabetes with neuropathy ([place]) 11/27/2013  . Cystic kidney disease 11/09/2013  . Insulin dependent type 2 diabetes mellitus ([place]) 10/29/2013  . Hoarseness or changing voice 09/27/2013  . Dyskinesia, tardive 07/11/2013  . TMJ syndrome 02/16/2013  . Angina pectoris associated with type 2 diabetes mellitus ([place]) 08/08/2012  . CAD in native artery 08/08/2012  . Asthma, chronic, PRESUMED wiht MULTIFACTORIAL DYSPNEA 05/30/2012  . Insomnia 09/22/2011  . Mobility poor 02/02/2011  . Chronic kidney disease, stage III (moderate) ([place]) 05/13/2010  . Chronic combined systolic and diastolic CHF, NYHA class 2 ([place]) 03/31/2010  . COPD (chronic obstructive pulmonary disease) ([place]) 03/26/2010  . Morbid obesity ([place]) 02/27/2010  . Unstable gait 02/27/2010  . DM (diabetes mellitus), type 2 with neurological complications ([place]) 78/67/6720  . Osteoarthritis 04/25/2009  . Anemia 03/11/2007  . COGNITIVE IMPAIRMENT, MILD, SO STATED 03/11/2007  . RESTLESS LEG SYNDROME 03/11/2007  . Systemic lupus ([place]) 03/11/2007  . OSTEOARTHROSIS, GENERALIZED, MULTIPLE SITES 03/11/2007  . Hypothyroidism 03/07/2007  . DYSLIPIDEMIA 03/07/2007  . Depression 03/07/2007  . Essential hypertension 03/07/2007  . Coronary atherosclerosis 03/07/2007  . GERD 03/07/2007  . SLEEP APNEA 03/07/2007    Orientation RESPIRATION BLADDER Height & Weight     Self, Time, Situation, Place    Continent Weight: 121.1 kg Height:  5\' 3"  (160 cm)  BEHAVIORAL SYMPTOMS/MOOD NEUROLOGICAL BOWEL NUTRITION  STATUS      Continent Diet(please see AVS.)  AMBULATORY STATUS COMMUNICATION OF NEEDS Skin   Extensive Assist Verbally Normal                        Personal Care Assistance Level of Assistance  Bathing, Feeding, Dressing Bathing Assistance: Maximum assistance Feeding assistance: Limited assistance Dressing Assistance: Maximum assistance     Functional Limitations Info  Sight, Hearing, Speech Sight Info: Adequate Hearing Info: Adequate Speech Info: Adequate    SPECIAL CARE FACTORS FREQUENCY  PT (By licensed PT), OT (By licensed OT)     PT Frequency: 5 times a week OT Frequency: 5 times a week             Contractures Contractures Info: Not present    Additional Factors Info  Code Status, Allergies Code Status Info: Prior  Allergies Info: Ace Inhibitors, Isosorb Dinitrate-hydralazine, Penicillins, Buspirone, Pregabalin, Ropinirole Hydrochloride, Amantadines           Current Medications (04/12/2018):  This is the current hospital active medication list No current facility-administered medications for this encounter.    Current Outpatient Medications  Medication Sig Dispense Refill  . ACCU-CHEK AVIVA PLUS test strip TEST THREE TIMES A DAY 100 each 5  . acetaminophen (TYLENOL) 325 MG tablet Take 650 mg by mouth every 6 (six) hours as needed for mild pain or moderate pain.    Marland Kitchen albuterol (PROAIR HFA) 108 (90 Base) MCG/ACT inhaler inhale 2 puffs by mouth every 4 hours if needed USE ONLY IF YOU ARE WHEEZING 1 Inhaler 0  . allopurinol (ZYLOPRIM) 100 MG tablet Take 1 tablet (100 mg total) by mouth daily. 90 tablet 3  . amLODipine (NORVASC) 10 MG tablet TAKE 1 TABLET BY MOUTH ONCE DAILY 90 tablet 0  . Ascorbic Acid (VITAMIN C PO) Take 1 tablet by mouth daily.    Marland Kitchen aspirin EC 81 MG tablet Take 81 mg by mouth daily.     Marland Kitchen atorvastatin (LIPITOR) 20 MG tablet TAKE 1 TABLET BY MOUTH DAILY 30 tablet 0  . B Complex Vitamins (VITAMIN B COMPLEX) TABS Take 1 tablet by mouth daily.    . Blood Glucose Monitoring Suppl (ACCU-CHEK AVIVA) device Use to test blood sugar three times a day. Dx. E11.9 1 each 1  . Cholecalciferol  (VITAMIN D3) 5000 UNITS CAPS Take 5,000 Units by mouth daily.    . diclofenac sodium (VOLTAREN) 1 % GEL Apply 4 g topically 4 (four) times daily. 4 Tube 3  . ferrous sulfate 325 (65 FE) MG tablet Take 325 mg by mouth daily with breakfast.    . furosemide (LASIX) 40 MG tablet TAKE 1 TABLET BY MOUTH ONCE DAILY 90 tablet 0  . gabapentin (NEURONTIN) 300 MG capsule Take 1 capsule (300 mg total) by mouth 2 (two) times daily. 180 capsule 3  . guaiFENesin (ROBITUSSIN) 100 MG/5ML liquid Take 200 mg by mouth 3 (three) times daily as needed for cough.    . Insulin NPH, Human,, Isophane, (HUMULIN N KWIKPEN) 100 UNIT/ML Kiwkpen INJECT 30 UNITS SUBCUTANEOUSLY EVERY MORNING 30 mL 3  . Insulin Pen Needle (PEN NEEDLES 31GX5/16") 31G X 8 MM MISC Check Blood sugar three times per day. Dx insulin using E11.8 100 each 3  . levETIRAcetam (KEPPRA) 500 MG tablet Take 1 tablet (500 mg total) by mouth 2 (two) times daily. 180 tablet 3  . metoprolol tartrate (LOPRESSOR) 50 MG tablet TAKE 1 TABLET BY MOUTH EVERY DAY WITH OR  IMME DIATELY FOLLOWING A MEAL FOR HYPERTENSION 90 tablet 0  . montelukast (SINGULAIR) 10 MG tablet Take 1 tablet (10 mg total) by mouth at bedtime. 30 tablet 0  . Multiple Vitamin (MULTIVITAMIN WITH MINERALS) TABS tablet Take 1 tablet by mouth daily.    . nitroGLYCERIN (NITROSTAT) 0.4 MG SL tablet Place 1 tablet (0.4 mg total) under the tongue every 5 (five) minutes as needed for chest pain. 20 tablet 12  . Olopatadine HCl (PAZEO) 0.7 % SOLN Apply 1 drop to eye daily. 1 Bottle 2  . pantoprazole (PROTONIX) 40 MG tablet Take 1 tablet (40 mg total) by mouth daily. 90 tablet 3  . Polyethyl Glycol-Propyl Glycol (SYSTANE ULTRA OP) Place 1 drop into both eyes every 6 (six) hours as needed (dry eyes).    . potassium chloride (K-DUR,KLOR-CON) 10 MEQ tablet Take 1 tablet by mouth 2 (two) times daily.  2     Discharge Medications: Please see discharge summary for a list of discharge medications.  Relevant  Imaging Results:  Relevant Lab Results:   Additional Information [id number]  Wetzel Bjornstad, LCSWA

## 2018-04-12 NOTE — ED Notes (Signed)
Placed order for dinner tray

## 2018-04-12 NOTE — Discharge Planning (Signed)
EDCM spoke with pt and daughter at bedside transition planning.  Pt is from home alone and deconditioned. [facility] consulted SW and PT to help determine level of care post discharge.

## 2018-04-12 NOTE — Evaluation (Addendum)
Physical Therapy Evaluation Patient Details Name: Brittany Roberts MRN: [id number] DOB: [birth year] Today's Date: 04/12/2018   History of Present Illness  Pt is a 77 y/o female presenting to ED after multiple falls at home within the past 24 hours. Pt presented to ED on 10/8 after falling as well and scans showed R foot (2-4 metatarsal) fractures. Imaging for R knee and ankle negative for acute abnormality. PMH includes CHF, DM, HTN, CAD, fibromyalgia, OSA, on 2L of home oxygen.   Clinical Impression  Pt presenting with problem above and deficits below. Pt extremely limited by pain this session. Pt able to come to long sitting in bed and roll side to side with mod to max A. Attempted to come to sitting at EOB, however, pt with increased pain when RLE off EOB and started yelling to put foot back on bed. Pt with multiple falls within the past couple of days and currently lives alone. Feel pt is at increased risk for falls and will be unable to perform necessary mobility tasks at home. Will continue to follow acutely to maximize functional mobility independence and safety.     Follow Up Recommendations SNF;Supervision/Assistance - 24 hour    Equipment Recommendations  None recommended by PT    Recommendations for Other Services       Precautions / Restrictions Precautions Precautions: Fall Precaution Comments: Multiple falls at home within the past couple of days.  Required Braces or Orthoses: Other Brace/Splint Other Brace/Splint: CAM walker boot  Restrictions Weight Bearing Restrictions: Yes RLE Weight Bearing: Non weight bearing      Mobility  Bed Mobility Overal bed mobility: Needs Assistance Bed Mobility: Supine to Sit;Sit to Supine;Rolling Rolling: Mod assist;Max assist   Supine to sit: Mod assist Sit to supine: Mod assist   General bed mobility comments: Attempted to come to sitting at EOB, however, when bringing RLE off EOB, pt screaming in pain. Required assist to lift RLE back  onto bed. Mod A for trunk elevation to come to long sitting. Pt heavily reliant on UEs. Required mod A for assist with trunk lowering back to supine as well. Required mod A to roll to the R and max A to roll to the L at end of session for repositioning.   Transfers                 General transfer comment: Unable   Ambulation/Gait                Stairs            Wheelchair Mobility    Modified Rankin (Stroke Patients Only)       Balance Overall balance assessment: Needs assistance Sitting-balance support: Bilateral upper extremity supported;Feet supported Sitting balance-Leahy Scale: Poor Sitting balance - Comments: Heavy reliance on UE support                                      Pertinent Vitals/Pain Pain Assessment: Faces Faces Pain Scale: Hurts whole lot Pain Location: R foot/ankle  Pain Descriptors / Indicators: Grimacing;Sharp;Shooting Pain Intervention(s): Limited activity within patient's tolerance;Monitored during session;Repositioned    Home Living Family/patient expects to be discharged to:: Skilled nursing facility                 Additional Comments: Pt lives alone with an aide for 2-3 hours 7 days/week    Prior Function Level of Independence:  Needs assistance   Gait / Transfers Assistance Needed: Reports she was using RW for ambulation   ADL's / Homemaking Assistance Needed: Aide assist with cooking, bathing/dressing        Hand Dominance   Dominant Hand: Right    Extremity/Trunk Assessment   Upper Extremity Assessment Upper Extremity Assessment: Generalized weakness    Lower Extremity Assessment Lower Extremity Assessment: Generalized weakness;RLE deficits/detail RLE Deficits / Details: RLE in CAM boot throughout. Limited ROM secondary to pain in R knee.     Cervical / Trunk Assessment Cervical / Trunk Assessment: Kyphotic  Communication   Communication: No difficulties  Cognition  Arousal/Alertness: Awake/alert Behavior During Therapy: WFL for tasks assessed/performed Overall Cognitive Status: Within Functional Limits for tasks assessed                                        General Comments General comments (skin integrity, edema, etc.): Pt's daughter and grandaughter present throughout session. Educated about recommendations for SNF.     Exercises     Assessment/Plan    PT Assessment Patient needs continued PT services  PT Problem List Decreased strength;Decreased balance;Decreased activity tolerance;Decreased mobility;Decreased knowledge of use of DME;Decreased knowledge of precautions;Pain       PT Treatment Interventions DME instruction;Gait training;Therapeutic activities;Functional mobility training;Therapeutic exercise;Balance training;Patient/family education    PT Goals (Current goals can be found in the Care Plan section)  Acute Rehab PT Goals Patient Stated Goal: to stop falling PT Goal Formulation: With patient Time For Goal Achievement: 04/26/18 Potential to Achieve Goals: Fair    Frequency Min 2X/week   Barriers to discharge Decreased caregiver support      Co-evaluation               AM-PAC PT "6 Clicks" Daily Activity  Outcome Measure Difficulty turning over in bed (including adjusting bedclothes, sheets and blankets)?: Unable Difficulty moving from lying on back to sitting on the side of the bed? : Unable Difficulty sitting down on and standing up from a chair with arms (e.g., wheelchair, bedside commode, etc,.)?: Unable Help needed moving to and from a bed to chair (including a wheelchair)?: Total Help needed walking in hospital room?: Total Help needed climbing 3-5 steps with a railing? : Total 6 Click Score: 6    End of Session   Activity Tolerance: Patient limited by pain Patient left: in bed;with call bell/phone within reach;with family/visitor present Nurse Communication: Mobility status PT Visit  Diagnosis: Difficulty in walking, not elsewhere classified (R26.2);Unsteadiness on feet (R26.81);Repeated falls (R29.6);Muscle weakness (generalized) (M62.81);History of falling (Z91.81);Pain Pain - Right/Left: Right Pain - part of body: Ankle and joints of foot    Time: 7341-9379 PT Time Calculation (min) (ACUTE ONLY): 23 min   Charges:   PT Evaluation $PT Eval Moderate Complexity: 1 Mod PT Treatments $Therapeutic Activity: 8-22 mins        Leighton Ruff, PT, DPT  Acute Rehabilitation Services  Pager: [phone] Office: [phone]   Rudean Hitt 04/12/2018, 6:10 PM

## 2018-04-12 NOTE — Progress Notes (Addendum)
CSW spoke with pt and pt's family members. Pt feels unsafe to return home. Pt is awaiting PT consult. FL-2 completed. Pt is agreeable to SNF placement and is open to any facility within [place]. CSW provided pt and pt's family with list of SNFs within 25 miles of [place]. FL-2 faxed out to facilities. CSW will fax out PT evaluations once completed.   Update: CSW faxed PT notes to facilities.   Wendelyn Breslow, [facility]  [phone]

## 2018-04-13 DIAGNOSIS — M1711 Unilateral primary osteoarthritis, right knee: Secondary | ICD-10-CM | POA: Diagnosis not present

## 2018-04-13 LAB — CBG MONITORING, ED
Glucose-Capillary: 119 mg/dL — ABNORMAL HIGH (ref 70–99)
Glucose-Capillary: 152 mg/dL — ABNORMAL HIGH (ref 70–99)
Glucose-Capillary: 121 mg/dL — ABNORMAL HIGH (ref 70–99)

## 2018-04-13 NOTE — ED Notes (Signed)
Carb Modified Diet was ordered for Lunch. 

## 2018-04-13 NOTE — Progress Notes (Addendum)
2:51pm-CSW followed up with [facility] and was informed that authorization still has not been received at this time. CSW updated pt and daughter at bedside. CSW will continue to follow at this time.   9:48am- CSW received text from [facility] that pt they would start authorization on pt for placement. [facility] to follow up with CSW once authorization has been received.  8:31am-CSW spoke with Olivia Mackie from [facility] and was informed that she will review pt's information once in the office. Olivia Mackie did express that she would need to reach to administration at the facility to see if they can met pt's needs. CSW was advised that if [facility] can take pt then they would need to order a bariatric bed and began authorization. Olivia Mackie from [facility] to follow up with CSW once decision has been made.   8:03am-CSW left voicemail for pt's daughter Ms. Levandowski asking for call back regarding placement for pt.   CSW went to speak with pt to give bed offers. CSW informed pt that at this time only three facilities have expressed the ability to take pt ([facility], [facility], and [facility]). When presented to pt, pt states "I dont know where any of those are, I want [facility]". CSW advise pt that [facility] hasn't accepted by however CSW would call and see if the facility can take pt if not CSW informed pt that CSW would need a second choice of facility as pt can not continue to stay in the ED. Pt expressed that CSW call daughter.  CSW to follow up with [facility] admissions once in. CSW will follow up with daughter for bed choice if facility is unable to take pt.   Virgie Dad Fantasy Donald, MSW, [facility] Clinical Social Worker [phone]

## 2018-04-13 NOTE — Progress Notes (Signed)
CSW received update from [place] with [facility] that authorization still has not been received from insurance.   CSW updated pt and pt's family members.  Wendelyn Breslow, [facility]  [phone]

## 2018-04-13 NOTE — ED Notes (Signed)
Pt asked this RN to shut door and let her rest now. Gave pt her call bell and told her to let me know if sh needed me to let me know otherwise I would let her rest without being bothered.

## 2018-04-13 NOTE — ED Notes (Signed)
Pt was given snack of peanut butter, graham crackers, and diet coke.  Carb modified meal tray has been ordered for dinner.

## 2018-04-14 DIAGNOSIS — S92301D Fracture of unspecified metatarsal bone(s), right foot, subsequent encounter for fracture with routine healing: Secondary | ICD-10-CM | POA: Diagnosis not present

## 2018-04-14 DIAGNOSIS — Z9181 History of falling: Secondary | ICD-10-CM | POA: Diagnosis not present

## 2018-04-14 DIAGNOSIS — E119 Type 2 diabetes mellitus without complications: Secondary | ICD-10-CM | POA: Diagnosis not present

## 2018-04-14 DIAGNOSIS — M79662 Pain in left lower leg: Secondary | ICD-10-CM | POA: Diagnosis not present

## 2018-04-14 DIAGNOSIS — M797 Fibromyalgia: Secondary | ICD-10-CM | POA: Diagnosis not present

## 2018-04-14 DIAGNOSIS — Z7401 Bed confinement status: Secondary | ICD-10-CM | POA: Diagnosis not present

## 2018-04-14 DIAGNOSIS — R2681 Unsteadiness on feet: Secondary | ICD-10-CM | POA: Diagnosis not present

## 2018-04-14 DIAGNOSIS — Z79899 Other long term (current) drug therapy: Secondary | ICD-10-CM | POA: Diagnosis not present

## 2018-04-14 DIAGNOSIS — J449 Chronic obstructive pulmonary disease, unspecified: Secondary | ICD-10-CM | POA: Diagnosis not present

## 2018-04-14 DIAGNOSIS — M79671 Pain in right foot: Secondary | ICD-10-CM | POA: Diagnosis not present

## 2018-04-14 DIAGNOSIS — M6281 Muscle weakness (generalized): Secondary | ICD-10-CM | POA: Diagnosis not present

## 2018-04-14 DIAGNOSIS — I110 Hypertensive heart disease with heart failure: Secondary | ICD-10-CM | POA: Diagnosis not present

## 2018-04-14 DIAGNOSIS — M79661 Pain in right lower leg: Secondary | ICD-10-CM | POA: Diagnosis not present

## 2018-04-14 DIAGNOSIS — E785 Hyperlipidemia, unspecified: Secondary | ICD-10-CM | POA: Diagnosis not present

## 2018-04-14 DIAGNOSIS — M25562 Pain in left knee: Secondary | ICD-10-CM | POA: Diagnosis not present

## 2018-04-14 DIAGNOSIS — M25569 Pain in unspecified knee: Secondary | ICD-10-CM | POA: Diagnosis not present

## 2018-04-14 DIAGNOSIS — E039 Hypothyroidism, unspecified: Secondary | ICD-10-CM | POA: Diagnosis not present

## 2018-04-14 DIAGNOSIS — R2689 Other abnormalities of gait and mobility: Secondary | ICD-10-CM | POA: Diagnosis not present

## 2018-04-14 DIAGNOSIS — Z7982 Long term (current) use of aspirin: Secondary | ICD-10-CM | POA: Diagnosis not present

## 2018-04-14 DIAGNOSIS — K5900 Constipation, unspecified: Secondary | ICD-10-CM | POA: Diagnosis not present

## 2018-04-14 DIAGNOSIS — R296 Repeated falls: Secondary | ICD-10-CM | POA: Diagnosis not present

## 2018-04-14 DIAGNOSIS — R7989 Other specified abnormal findings of blood chemistry: Secondary | ICD-10-CM | POA: Diagnosis not present

## 2018-04-14 DIAGNOSIS — M1711 Unilateral primary osteoarthritis, right knee: Secondary | ICD-10-CM | POA: Diagnosis not present

## 2018-04-14 DIAGNOSIS — M25511 Pain in right shoulder: Secondary | ICD-10-CM | POA: Diagnosis not present

## 2018-04-14 DIAGNOSIS — R620 Delayed milestone in childhood: Secondary | ICD-10-CM | POA: Diagnosis not present

## 2018-04-14 DIAGNOSIS — I509 Heart failure, unspecified: Secondary | ICD-10-CM | POA: Diagnosis not present

## 2018-04-14 DIAGNOSIS — M79672 Pain in left foot: Secondary | ICD-10-CM | POA: Diagnosis not present

## 2018-04-14 DIAGNOSIS — M2550 Pain in unspecified joint: Secondary | ICD-10-CM | POA: Diagnosis not present

## 2018-04-14 DIAGNOSIS — M109 Gout, unspecified: Secondary | ICD-10-CM | POA: Diagnosis not present

## 2018-04-14 DIAGNOSIS — M25561 Pain in right knee: Secondary | ICD-10-CM | POA: Diagnosis not present

## 2018-04-14 DIAGNOSIS — W19XXXA Unspecified fall, initial encounter: Secondary | ICD-10-CM | POA: Diagnosis not present

## 2018-04-14 DIAGNOSIS — I10 Essential (primary) hypertension: Secondary | ICD-10-CM | POA: Diagnosis not present

## 2018-04-14 DIAGNOSIS — M79605 Pain in left leg: Secondary | ICD-10-CM | POA: Diagnosis not present

## 2018-04-14 DIAGNOSIS — I2510 Atherosclerotic heart disease of native coronary artery without angina pectoris: Secondary | ICD-10-CM | POA: Diagnosis not present

## 2018-04-14 DIAGNOSIS — Z87891 Personal history of nicotine dependence: Secondary | ICD-10-CM | POA: Diagnosis not present

## 2018-04-14 DIAGNOSIS — M25512 Pain in left shoulder: Secondary | ICD-10-CM | POA: Diagnosis not present

## 2018-04-14 DIAGNOSIS — S92331A Displaced fracture of third metatarsal bone, right foot, initial encounter for closed fracture: Secondary | ICD-10-CM | POA: Diagnosis not present

## 2018-04-14 DIAGNOSIS — M79604 Pain in right leg: Secondary | ICD-10-CM | POA: Diagnosis not present

## 2018-04-14 LAB — CBG MONITORING, ED: Glucose-Capillary: 119 mg/dL — ABNORMAL HIGH (ref 70–99)

## 2018-04-14 NOTE — ED Notes (Addendum)
Carb modified diet lunch ordered.

## 2018-04-14 NOTE — Discharge Instructions (Addendum)
Discharge to [facility].  Follow-up with your primary care doctor.

## 2018-04-14 NOTE — ED Notes (Signed)
Pt discharged from ED; instructions provided and scripts given; Pt encouraged to return to ED if symptoms worsen and to f/u with PCP; Pt verbalized understanding of all instructions; Report provided to PTAR and RN called report to accepting facility

## 2018-04-14 NOTE — ED Notes (Signed)
Carb Modified Diet was ordered for Breakfast.

## 2018-04-14 NOTE — Progress Notes (Addendum)
12:21pm-CSW has called and scheduled PTAR for 12:30pm. CSW has faxed over all needed information to [facility] at this time. Pt to be taken to room 102. RN expressed that he has already called report at this. CSW has reached out to daughter to update her of pt's transportation however no call back at this time.   There are no further CSW needs warranted at this time. CSW will sign off.   CSW made aware that [facility] has received authorization and pt can come to the facility after lunch today. CSW will continue to follow for further needs.    Virgie Dad Gjon Letarte, MSW, [facility] Clinical Social Worker [phone]

## 2018-04-17 DIAGNOSIS — M25512 Pain in left shoulder: Secondary | ICD-10-CM | POA: Diagnosis not present

## 2018-04-17 DIAGNOSIS — M79661 Pain in right lower leg: Secondary | ICD-10-CM | POA: Diagnosis not present

## 2018-04-17 DIAGNOSIS — M25562 Pain in left knee: Secondary | ICD-10-CM | POA: Diagnosis not present

## 2018-04-17 DIAGNOSIS — M25561 Pain in right knee: Secondary | ICD-10-CM | POA: Diagnosis not present

## 2018-04-17 DIAGNOSIS — M79662 Pain in left lower leg: Secondary | ICD-10-CM | POA: Diagnosis not present

## 2018-04-17 NOTE — Addendum Note (Signed)
Addended byValente David on: 04/17/2018 11:22 AM   Modules accepted: Orders

## 2018-04-18 ENCOUNTER — Other Ambulatory Visit: Payer: Self-pay | Admitting: Licensed Clinical Social Worker

## 2018-04-18 DIAGNOSIS — I509 Heart failure, unspecified: Secondary | ICD-10-CM | POA: Diagnosis not present

## 2018-04-18 DIAGNOSIS — S92301D Fracture of unspecified metatarsal bone(s), right foot, subsequent encounter for fracture with routine healing: Secondary | ICD-10-CM | POA: Diagnosis not present

## 2018-04-18 DIAGNOSIS — M25569 Pain in unspecified knee: Secondary | ICD-10-CM | POA: Diagnosis not present

## 2018-04-18 DIAGNOSIS — M109 Gout, unspecified: Secondary | ICD-10-CM | POA: Diagnosis not present

## 2018-04-18 NOTE — Patient Outreach (Signed)
[facility]) Care Management  [facility] Social Work  04/18/2018  Brittany Roberts [birth year] [id number]  Subjective:    Objective:   Encounter Medications:  Outpatient Encounter Medications as of 04/18/2018  Medication Sig  . acetaminophen (TYLENOL) 325 MG tablet Take 650 mg by mouth every 6 (six) hours as needed for mild pain or moderate pain.  Marland Kitchen albuterol (PROAIR HFA) 108 (90 Base) MCG/ACT inhaler inhale 2 puffs by mouth every 4 hours if needed USE ONLY IF YOU ARE WHEEZING  . allopurinol (ZYLOPRIM) 100 MG tablet Take 1 tablet (100 mg total) by mouth daily.  Marland Kitchen amLODipine (NORVASC) 10 MG tablet TAKE 1 TABLET BY MOUTH ONCE DAILY  . Ascorbic Acid (VITAMIN C PO) Take 1 tablet by mouth daily.  Marland Kitchen aspirin EC 81 MG tablet Take 81 mg by mouth daily.   Marland Kitchen atorvastatin (LIPITOR) 20 MG tablet TAKE 1 TABLET BY MOUTH DAILY  . B Complex Vitamins (VITAMIN B COMPLEX) TABS Take 1 tablet by mouth daily.  . Blood Glucose Monitoring Suppl (ACCU-CHEK AVIVA) device Use to test blood sugar three times a day. Dx. E11.9  . Cholecalciferol (VITAMIN D3) 5000 UNITS CAPS Take 5,000 Units by mouth daily.  . diclofenac sodium (VOLTAREN) 1 % GEL Apply 4 g topically 4 (four) times daily.  . ferrous sulfate 325 (65 FE) MG tablet Take 325 mg by mouth daily with breakfast.  . furosemide (LASIX) 40 MG tablet TAKE 1 TABLET BY MOUTH ONCE DAILY  . gabapentin (NEURONTIN) 300 MG capsule Take 1 capsule (300 mg total) by mouth 2 (two) times daily.  Marland Kitchen guaiFENesin (ROBITUSSIN) 100 MG/5ML liquid Take 200 mg by mouth 3 (three) times daily as needed for cough.  . Insulin NPH, Human,, Isophane, (HUMULIN N KWIKPEN) 100 UNIT/ML Kiwkpen INJECT 30 UNITS SUBCUTANEOUSLY EVERY MORNING  . Insulin Pen Needle (PEN NEEDLES 31GX5/16") 31G X 8 MM MISC Check Blood sugar three times per day. Dx insulin using E11.8  . levETIRAcetam (KEPPRA) 500 MG tablet Take 1 tablet (500 mg total) by mouth 2 (two) times daily.  . metoprolol tartrate  (LOPRESSOR) 50 MG tablet TAKE 1 TABLET BY MOUTH EVERY DAY WITH OR IMME DIATELY FOLLOWING A MEAL FOR HYPERTENSION  . montelukast (SINGULAIR) 10 MG tablet Take 1 tablet (10 mg total) by mouth at bedtime.  . Multiple Vitamin (MULTIVITAMIN WITH MINERALS) TABS tablet Take 1 tablet by mouth daily.  . nitroGLYCERIN (NITROSTAT) 0.4 MG SL tablet Place 1 tablet (0.4 mg total) under the tongue every 5 (five) minutes as needed for chest pain.  Marland Kitchen Olopatadine HCl (PAZEO) 0.7 % SOLN Apply 1 drop to eye daily.  . pantoprazole (PROTONIX) 40 MG tablet Take 1 tablet (40 mg total) by mouth daily.  Brittany Roberts (SYSTANE ULTRA OP) Place 1 drop into both eyes every 6 (six) hours as needed (dry eyes).  . potassium chloride (K-DUR,KLOR-CON) 10 MEQ tablet Take 1 tablet by mouth 2 (two) times daily.   No facility-administered encounter medications on file as of 04/18/2018.     Functional Status:  In your present state of health, do you have any difficulty performing the following activities: 03/15/2018 02/13/2018  Hearing? Brittany Roberts  Vision? Y Y  Difficulty concentrating or making decisions? - Y  Walking or climbing stairs? - Y  Dressing or bathing? - Y  Doing errands, shopping? - Y  Preparing Food and eating ? - Y  Using the Toilet? - Y  In the past six months, have you accidently leaked  urine? - Y  Do you have problems with loss of bowel control? - Y  Managing your Medications? - Y  Managing your Finances? - N  Housekeeping or managing your Housekeeping? - Y  Some recent data might be hidden    Fall/Depression Screening:  PHQ 2/9 Scores 03/15/2018 01/31/2018 12/29/2016 12/23/2016 10/15/2016 05/19/2016 04/01/2016  PHQ - 2 Score 0 0 6 0 0 0 1  PHQ- 9 Score - - - - - - -    Assessment:   CSW traveled to [facility] in [place], [place] on 04/18/18 to visit client. CSW met with client on 04/18/18 at client's room at nursing center. Client has been receiving nursing  care and physical therapy support at facility.  She had previously been residing alone at her home in the community. She has support from her daughter.  Client has experienced several falls recently.  Client has [employer].  Client has [employer].  Her daughter is not sure if client will be able to return home since client has had several recent falls and does live alone.  Client has rollator walker at home, oxygen at home (concentrator) and has a nebulizer at home.   Client said she has been participating in physical therapy sessions. Client has fibromyalgia and spoke of fibromyalgia.   Client spoke of her prescribed medications.  CSW encouraged client to talk with RN at facility  related to client's medication questions.  CSW talked with client about client rights. CSW encouraged client to talk with facility social worker related to client discharge plans. CSW gave client [facility] CSW card and encouraged client to call CSW as needed to discuss social work needs of client. Client said she is working with physical therapy on standing exercises. She said she is walking a few steps with assistance. CSW talked with client about long term care Medicaid for Skilled Nursing Level of  Care.  CSW thanked client for allowing CSW to visit her at nursing center on 04/18/18.  Client was appreciative of CSW visit with her on 04/18/18.   Plan:   CSW to call client or daughter of client in 3 weeks to assess client needs and discharge plans.  Brittany Roberts.Brittany Roberts MSW, LCSW Licensed Clinical Social Worker [facility] Care Management [phone]

## 2018-04-19 DIAGNOSIS — M79605 Pain in left leg: Secondary | ICD-10-CM | POA: Diagnosis not present

## 2018-04-19 DIAGNOSIS — M79672 Pain in left foot: Secondary | ICD-10-CM | POA: Diagnosis not present

## 2018-04-19 DIAGNOSIS — M25512 Pain in left shoulder: Secondary | ICD-10-CM | POA: Diagnosis not present

## 2018-04-19 DIAGNOSIS — M79671 Pain in right foot: Secondary | ICD-10-CM | POA: Diagnosis not present

## 2018-04-19 DIAGNOSIS — M79604 Pain in right leg: Secondary | ICD-10-CM | POA: Diagnosis not present

## 2018-04-20 DIAGNOSIS — S92301D Fracture of unspecified metatarsal bone(s), right foot, subsequent encounter for fracture with routine healing: Secondary | ICD-10-CM | POA: Diagnosis not present

## 2018-04-20 DIAGNOSIS — K5900 Constipation, unspecified: Secondary | ICD-10-CM | POA: Diagnosis not present

## 2018-04-20 DIAGNOSIS — M797 Fibromyalgia: Secondary | ICD-10-CM | POA: Diagnosis not present

## 2018-04-20 DIAGNOSIS — M25569 Pain in unspecified knee: Secondary | ICD-10-CM | POA: Diagnosis not present

## 2018-04-21 DIAGNOSIS — R7989 Other specified abnormal findings of blood chemistry: Secondary | ICD-10-CM | POA: Diagnosis not present

## 2018-04-21 DIAGNOSIS — K5900 Constipation, unspecified: Secondary | ICD-10-CM | POA: Diagnosis not present

## 2018-04-21 DIAGNOSIS — I509 Heart failure, unspecified: Secondary | ICD-10-CM | POA: Diagnosis not present

## 2018-04-24 ENCOUNTER — Encounter (HOSPITAL_COMMUNITY): Payer: Self-pay

## 2018-04-25 DIAGNOSIS — R2689 Other abnormalities of gait and mobility: Secondary | ICD-10-CM | POA: Diagnosis not present

## 2018-04-25 DIAGNOSIS — M25511 Pain in right shoulder: Secondary | ICD-10-CM | POA: Diagnosis not present

## 2018-04-25 DIAGNOSIS — Z9181 History of falling: Secondary | ICD-10-CM | POA: Diagnosis not present

## 2018-04-25 DIAGNOSIS — M79671 Pain in right foot: Secondary | ICD-10-CM | POA: Diagnosis not present

## 2018-04-25 DIAGNOSIS — M25512 Pain in left shoulder: Secondary | ICD-10-CM | POA: Diagnosis not present

## 2018-04-26 ENCOUNTER — Ambulatory Visit (INDEPENDENT_AMBULATORY_CARE_PROVIDER_SITE_OTHER): Payer: Medicare Other | Admitting: Orthopaedic Surgery

## 2018-04-26 ENCOUNTER — Encounter (INDEPENDENT_AMBULATORY_CARE_PROVIDER_SITE_OTHER): Payer: Self-pay | Admitting: Orthopaedic Surgery

## 2018-04-26 ENCOUNTER — Ambulatory Visit (INDEPENDENT_AMBULATORY_CARE_PROVIDER_SITE_OTHER): Payer: Self-pay

## 2018-04-26 DIAGNOSIS — S92331A Displaced fracture of third metatarsal bone, right foot, initial encounter for closed fracture: Secondary | ICD-10-CM | POA: Diagnosis not present

## 2018-04-26 DIAGNOSIS — M1711 Unilateral primary osteoarthritis, right knee: Secondary | ICD-10-CM | POA: Diagnosis not present

## 2018-04-26 DIAGNOSIS — M79671 Pain in right foot: Secondary | ICD-10-CM | POA: Diagnosis not present

## 2018-04-26 MED ORDER — HYLAN G-F 20 48 MG/6ML IX SOSY
48.0000 mg | PREFILLED_SYRINGE | INTRA_ARTICULAR | Status: AC | PRN
  Administered 2018-04-26: 48 mg via INTRA_ARTICULAR

## 2018-04-26 NOTE — Progress Notes (Signed)
Office Visit Note   Patient: Brittany Roberts           Date of Birth: 08/13/1940           MRN: [id number] Visit Date: 04/26/2018              Requested by: [name], [name], MD [address] [place], [place] [address] PCP: [name], [name], MD   Assessment & Plan: Visit Diagnoses:  1. Right foot pain   2. Unilateral primary osteoarthritis, right knee   3. Closed displaced fracture of third metatarsal bone of right foot, initial encounter     Plan: The cam walking boot is actually too heavy for her leg.  I do feel that she is 2 weeks into the tarsal base fractures that are stable and she can attempt weightbearing as tolerated in a postoperative shoe instead.  I did place a Synvisc 1 in her right knee today as well.  I gave notes for the skilled nursing facility to work on her mobility.  We will see her back in 4 weeks for an AP and oblique view for 2 views only of the right foot.  We can certainly consider steroid injection in her right shoulder that visit if needed.The patient meets the AMA guidelines for Morbid (severe) obesity with a BMI > 40.0 and I have recommended weight loss.   Follow-Up Instructions: Return in about 4 weeks (around 05/24/2018).   Orders:  Orders Placed This Encounter  Procedures  . Large Joint Inj  . Large Joint Inj  . XR Foot Complete Right   No orders of the defined types were placed in this encounter.     Procedures: Large Joint Inj: R knee on 04/26/2018 10:29 AM Indications: pain and diagnostic evaluation Details: 22 Roberts 1.5 in needle, superolateral approach  Arthrogram: No  Medications: 48 mg Hylan 48 MG/6ML Outcome: tolerated well, no immediate complications Procedure, treatment alternatives, risks and benefits explained, specific risks discussed. Consent was given by the patient. Immediately prior to procedure a time out was called to verify the correct patient, procedure, equipment, support staff and site/side marked as required. Patient was  prepped and draped in the usual sterile fashion.       Clinical Data: No additional findings.   Subjective: Chief Complaint  Patient presents with  . Right Foot - Fracture  The patient is a morbidly obese 77 year old female with debilitating arthritis in her right knee and her right shoulder.  She is scheduled for a hyaluronic acid injection in her right knee today.  However 2 weeks ago she did sustain a mechanical fall injuring her right foot.  She apparently sustained metatarsal fractures in the hospital put her in a cam walking boot.  She has not put any weight on her foot since then.  She is staying at [facility] facility.  Her daughter is with her today.  HPI  Review of Systems She currently denies any headache, chest pain, shortness of breath, fever, chills, nausea, vomiting.  Objective: Vital Signs: There were no vitals taken for this visit.  Physical Exam She is alert and oriented in no acute distress Ortho Exam Examination of her right shoulder she has profound weakness of the rotator cuff with significant glenohumeral disease and grinding.  She cannot abduct her shoulder past 45 degrees.  Examination of her right knee shows no effusion.  There is patellofemoral crepitation and painful range of motion of the knee.  There is varus malalignment.  Examination of her right foot does show pain in the midfoot with some slight swelling and no significant bruising. Specialty Comments:  No specialty comments available.  Imaging: Xr Foot Complete Right  Result Date: 04/26/2018 3 views of the right foot show nondisplaced fractures of the second and third metatarsal bases.    PMFS History: Patient Active Problem List   Diagnosis Date Noted  . Chronic right shoulder pain 03/15/2018  . Bilateral primary osteoarthritis of knee 02/09/2018  . Pain in right ankle and joints of right foot 02/09/2018  . Acute exacerbation of congestive heart failure ([place])  12/13/2017  . AKI (acute kidney injury) ([place]) 09/01/2017  . Gout, arthropathy 09/01/2017  . Localization-related symptomatic epilepsy and epileptic syndromes with complex partial seizures, not intractable, without status epilepticus ([place]) 04/25/2017  . Fibromyalgia 04/08/2017  . Allergic rhinitis 03/11/2017  . Cardiomyopathy ([place]) 01/22/2017  . Hyperlipidemia associated with type 2 diabetes mellitus ([place]) 01/22/2017  . Bipolar disorder ([place]) 01/22/2017  . Seizure ([place])   . Thoracic aortic atherosclerosis ([place]) 01/14/2017  . Coronary artery calcification seen on CAT scan 01/14/2017  . Acute delirium 01/14/2017  . Possible Tonic-clonic seizure disorder ([place]) 01/14/2017  . CVA (cerebral vascular accident) ([place]) 01/14/2017  . Altered mental status 01/13/2017  . Mastalgia 04/20/2016  . Bilateral leg edema 02/10/2016  . Hearing difficulty 10/30/2015  . Midline low back pain with right-sided sciatica 09/25/2015  . Angina pectoris ([place]) 06/30/2015  . Dysarthria 02/14/2015  . Schizophrenia, unspecified type ([place]) 10/25/2014  . Type 2 diabetes mellitus with complication ([place])   . Cephalalgia   . Hx of systemic lupus erythematosus (SLE) ([place])   . Thyroid mass 04/26/2014  . Constipation 12/10/2013  . Benign neoplasm of colon 12/10/2013  . Controlled type 2 diabetes with neuropathy ([place]) 11/27/2013  . Cystic kidney disease 11/09/2013  . Insulin dependent type 2 diabetes mellitus ([place]) 10/29/2013  . Hoarseness or changing voice 09/27/2013  . Dyskinesia, tardive 07/11/2013  . TMJ syndrome 02/16/2013  . Angina pectoris associated with type 2 diabetes mellitus ([place]) 08/08/2012  . CAD in native artery 08/08/2012  . Asthma, chronic, PRESUMED wiht MULTIFACTORIAL DYSPNEA 05/30/2012  . Insomnia 09/22/2011  . Mobility poor 02/02/2011  . Chronic kidney disease, stage III (moderate) ([place]) 05/13/2010  . Chronic combined systolic and diastolic CHF, NYHA class 2 ([place]) 03/31/2010  . COPD (chronic  obstructive pulmonary disease) ([place]) 03/26/2010  . Morbid obesity ([place]) 02/27/2010  . Unstable gait 02/27/2010  . DM (diabetes mellitus), type 2 with neurological complications ([place]) 14/78/2956  . Osteoarthritis 04/25/2009  . Anemia 03/11/2007  . COGNITIVE IMPAIRMENT, MILD, SO STATED 03/11/2007  . RESTLESS LEG SYNDROME 03/11/2007  . Systemic lupus ([place]) 03/11/2007  . OSTEOARTHROSIS, GENERALIZED, MULTIPLE SITES 03/11/2007  . Hypothyroidism 03/07/2007  . DYSLIPIDEMIA 03/07/2007  . Depression 03/07/2007  . Essential hypertension 03/07/2007  . Coronary atherosclerosis 03/07/2007  . GERD 03/07/2007  . SLEEP APNEA 03/07/2007   Past Medical History:  Diagnosis Date  . Acute delirium 01/14/2017  . Anxiety   . Arthritis    "all over"  . Asthma   . Bipolar disorder ([place])   . Complication of anesthesia    "w/right foot OR they gave me too much and they couldn't get me woke"  . Congestive heart failure, unspecified   . Coronary artery calcification seen on CAT scan 01/14/2017  . Coronary artery disease    "I've got 1 stent" (06/30/2015)  . Depression   . Discoid lupus   . Fibromyalgia    "  I've been told I have this; can't take Lyrica cause I'm allergic to it" (06/30/2015)  . GERD (gastroesophageal reflux disease)   . History of gout   . History of hiatal hernia    "real bad" (06/30/2015)  . Hypertension   . Hypothyroidism   . On home oxygen therapy    "2L q hs" (06/30/2015)  . OSA (obstructive sleep apnea)    "just use my oxygen; no mask" (06/30/2015)  . Other and unspecified hyperlipidemia   . Penetrating foot wound    left nonhealing foot wound on the dorsal surface  . Peripheral neuropathy   . Pneumonia "many of times"  . Refusal of blood transfusions as patient is Jehovah's Witness   . Renal failure, unspecified    "kidneys not working 100%" (06/30/2015)  . Sickle cell trait ([place])   . Systemic lupus ([place])   . Thoracic aortic atherosclerosis ([place]) 01/14/2017  . Type II  diabetes mellitus ([place])   . Unspecified vitamin D deficiency     Family History  Problem Relation Age of Onset  . Heart disease Mother   . Diabetes Mother   . Clotting disorder Mother   . Pneumonia Father   . Rheum arthritis Father   . Diabetes Sister   . Diabetes Sister   . Asthma Brother   . Cancer Brother   . Kidney disease Brother   . Lupus Son   . Heart disease Son   . Heart disease Daughter   . Colon cancer Neg Hx     Past Surgical History:  Procedure Laterality Date  . ANKLE ARTHROSCOPY WITH OPEN REDUCTION INTERNAL FIXATION (ORIF) Right 03/2011  . CARDIAC CATHETERIZATION N/A 07/01/2015   Procedure: Left Heart Cath and Coronary Angiography;  Surgeon: Adrian Prows, MD;  Location: [facility];  Service: Cardiovascular;  Laterality: N/A;  . CARDIAC CATHETERIZATION N/A 07/01/2015   Procedure: Intravascular Pressure Wire/FFR Study;  Surgeon: Adrian Prows, MD;  Location: [facility];  Service: Cardiovascular;  Laterality: N/A;  . CARPAL TUNNEL RELEASE Bilateral   . CATARACT EXTRACTION W/ INTRAOCULAR LENS  IMPLANT, BILATERAL Bilateral   . CHOLECYSTECTOMY OPEN    . COLONOSCOPY N/A 12/10/2013   Procedure: COLONOSCOPY;  Surgeon: Inda Castle, MD;  Location: [facility] ENDOSCOPY;  Service: Endoscopy;  Laterality: N/A;  . FRACTURE SURGERY    . INCISION AND DRAINAGE ABSCESS Left    foot "under my little toe"  . KNEE LIGAMENT RECONSTRUCTION Right   . LEFT HEART CATHETERIZATION WITH CORONARY ANGIOGRAM N/A 08/08/2012   Procedure: LEFT HEART CATHETERIZATION WITH CORONARY ANGIOGRAM;  Surgeon: [name], MD;  Location: [facility] CATH LAB;  Service: Cardiovascular;  Laterality: N/A;  . NASAL SINUS SURGERY    . PERCUTANEOUS CORONARY STENT INTERVENTION (PCI-S) N/A 08/21/2012   Procedure: PERCUTANEOUS CORONARY STENT INTERVENTION (PCI-S);  Surgeon: [name], MD;  Location[facility] C[facility] LAB;  Service: Cardiovascular;  Laterality: N/A;  . TUBAL LIGATION     Social History   Occupational  History  . Occupation: Retired- Engineer, production    Employer: RETIRED  Tobacco Use  . Smoking status: Former Smoker    Packs/day: 3.00    Years: 40.00    Pack years: 120.00    Types: Cigarettes    Start date: 07/05/1960    Last attempt to quit: 07/05/1998    Years since quitting: 19.8  . Smokeless tobacco: Never Used  Substance and Sexual Activity  . Alcohol use: No    Alcohol/week: 0.0  standard drinks    Comment: 06/30/2015 "used to drink a little alcohol on the weekends; not q weekend; nothing in years"  . Drug use: No  . Sexual activity: Never

## 2018-04-27 ENCOUNTER — Other Ambulatory Visit: Payer: Self-pay | Admitting: *Deleted

## 2018-04-27 DIAGNOSIS — M79671 Pain in right foot: Secondary | ICD-10-CM | POA: Diagnosis not present

## 2018-04-27 DIAGNOSIS — M25511 Pain in right shoulder: Secondary | ICD-10-CM | POA: Diagnosis not present

## 2018-04-27 DIAGNOSIS — R2689 Other abnormalities of gait and mobility: Secondary | ICD-10-CM | POA: Diagnosis not present

## 2018-04-27 DIAGNOSIS — M25512 Pain in left shoulder: Secondary | ICD-10-CM | POA: Diagnosis not present

## 2018-04-27 DIAGNOSIS — Z9181 History of falling: Secondary | ICD-10-CM | POA: Diagnosis not present

## 2018-04-27 NOTE — Patient Outreach (Signed)
[facility]) Care Management  04/27/2018  Brittany Roberts 01/04/1941 [id number]   Member remains admitted to [facility] for rehab.  She has been admitted for greater than 10 days.  Per workflow, will close to nursing at this time.  Will notify LCSW to place new referral upon discharge back home to continue nursing services.  Valente David, [place], MSN [place] [phone]

## 2018-05-01 DIAGNOSIS — M797 Fibromyalgia: Secondary | ICD-10-CM | POA: Diagnosis not present

## 2018-05-01 DIAGNOSIS — S92301D Fracture of unspecified metatarsal bone(s), right foot, subsequent encounter for fracture with routine healing: Secondary | ICD-10-CM | POA: Diagnosis not present

## 2018-05-01 DIAGNOSIS — M25569 Pain in unspecified knee: Secondary | ICD-10-CM | POA: Diagnosis not present

## 2018-05-02 DIAGNOSIS — I509 Heart failure, unspecified: Secondary | ICD-10-CM | POA: Diagnosis not present

## 2018-05-02 DIAGNOSIS — I10 Essential (primary) hypertension: Secondary | ICD-10-CM | POA: Diagnosis not present

## 2018-05-02 DIAGNOSIS — M797 Fibromyalgia: Secondary | ICD-10-CM | POA: Diagnosis not present

## 2018-05-02 DIAGNOSIS — E119 Type 2 diabetes mellitus without complications: Secondary | ICD-10-CM | POA: Diagnosis not present

## 2018-05-04 DIAGNOSIS — G3184 Mild cognitive impairment, so stated: Secondary | ICD-10-CM | POA: Diagnosis not present

## 2018-05-04 DIAGNOSIS — J449 Chronic obstructive pulmonary disease, unspecified: Secondary | ICD-10-CM | POA: Diagnosis not present

## 2018-05-04 DIAGNOSIS — D631 Anemia in chronic kidney disease: Secondary | ICD-10-CM | POA: Diagnosis not present

## 2018-05-04 DIAGNOSIS — Z9861 Coronary angioplasty status: Secondary | ICD-10-CM | POA: Diagnosis not present

## 2018-05-04 DIAGNOSIS — Z794 Long term (current) use of insulin: Secondary | ICD-10-CM | POA: Diagnosis not present

## 2018-05-04 DIAGNOSIS — E1140 Type 2 diabetes mellitus with diabetic neuropathy, unspecified: Secondary | ICD-10-CM | POA: Diagnosis not present

## 2018-05-04 DIAGNOSIS — M797 Fibromyalgia: Secondary | ICD-10-CM | POA: Diagnosis not present

## 2018-05-04 DIAGNOSIS — Z9181 History of falling: Secondary | ICD-10-CM | POA: Diagnosis not present

## 2018-05-04 DIAGNOSIS — S92331D Displaced fracture of third metatarsal bone, right foot, subsequent encounter for fracture with routine healing: Secondary | ICD-10-CM | POA: Diagnosis not present

## 2018-05-04 DIAGNOSIS — I10 Essential (primary) hypertension: Secondary | ICD-10-CM | POA: Diagnosis not present

## 2018-05-04 DIAGNOSIS — M109 Gout, unspecified: Secondary | ICD-10-CM | POA: Diagnosis not present

## 2018-05-04 DIAGNOSIS — M170 Bilateral primary osteoarthritis of knee: Secondary | ICD-10-CM | POA: Diagnosis not present

## 2018-05-04 DIAGNOSIS — N183 Chronic kidney disease, stage 3 (moderate): Secondary | ICD-10-CM | POA: Diagnosis not present

## 2018-05-04 DIAGNOSIS — I2510 Atherosclerotic heart disease of native coronary artery without angina pectoris: Secondary | ICD-10-CM | POA: Diagnosis not present

## 2018-05-04 DIAGNOSIS — M329 Systemic lupus erythematosus, unspecified: Secondary | ICD-10-CM | POA: Diagnosis not present

## 2018-05-04 DIAGNOSIS — Z7982 Long term (current) use of aspirin: Secondary | ICD-10-CM | POA: Diagnosis not present

## 2018-05-04 DIAGNOSIS — I130 Hypertensive heart and chronic kidney disease with heart failure and stage 1 through stage 4 chronic kidney disease, or unspecified chronic kidney disease: Secondary | ICD-10-CM | POA: Diagnosis not present

## 2018-05-04 DIAGNOSIS — I5042 Chronic combined systolic (congestive) and diastolic (congestive) heart failure: Secondary | ICD-10-CM | POA: Diagnosis not present

## 2018-05-04 DIAGNOSIS — I5030 Unspecified diastolic (congestive) heart failure: Secondary | ICD-10-CM | POA: Diagnosis not present

## 2018-05-05 ENCOUNTER — Telehealth: Payer: Self-pay | Admitting: Family Medicine

## 2018-05-05 DIAGNOSIS — S92331D Displaced fracture of third metatarsal bone, right foot, subsequent encounter for fracture with routine healing: Secondary | ICD-10-CM | POA: Diagnosis not present

## 2018-05-05 DIAGNOSIS — D631 Anemia in chronic kidney disease: Secondary | ICD-10-CM | POA: Diagnosis not present

## 2018-05-05 DIAGNOSIS — M109 Gout, unspecified: Secondary | ICD-10-CM | POA: Diagnosis not present

## 2018-05-05 DIAGNOSIS — M797 Fibromyalgia: Secondary | ICD-10-CM | POA: Diagnosis not present

## 2018-05-05 DIAGNOSIS — Z9181 History of falling: Secondary | ICD-10-CM | POA: Diagnosis not present

## 2018-05-05 DIAGNOSIS — I130 Hypertensive heart and chronic kidney disease with heart failure and stage 1 through stage 4 chronic kidney disease, or unspecified chronic kidney disease: Secondary | ICD-10-CM | POA: Diagnosis not present

## 2018-05-05 DIAGNOSIS — Z7982 Long term (current) use of aspirin: Secondary | ICD-10-CM | POA: Diagnosis not present

## 2018-05-05 DIAGNOSIS — E1140 Type 2 diabetes mellitus with diabetic neuropathy, unspecified: Secondary | ICD-10-CM | POA: Diagnosis not present

## 2018-05-05 DIAGNOSIS — I5042 Chronic combined systolic (congestive) and diastolic (congestive) heart failure: Secondary | ICD-10-CM | POA: Diagnosis not present

## 2018-05-05 DIAGNOSIS — G3184 Mild cognitive impairment, so stated: Secondary | ICD-10-CM | POA: Diagnosis not present

## 2018-05-05 DIAGNOSIS — I2510 Atherosclerotic heart disease of native coronary artery without angina pectoris: Secondary | ICD-10-CM | POA: Diagnosis not present

## 2018-05-05 DIAGNOSIS — J449 Chronic obstructive pulmonary disease, unspecified: Secondary | ICD-10-CM | POA: Diagnosis not present

## 2018-05-05 DIAGNOSIS — Z794 Long term (current) use of insulin: Secondary | ICD-10-CM | POA: Diagnosis not present

## 2018-05-05 DIAGNOSIS — M329 Systemic lupus erythematosus, unspecified: Secondary | ICD-10-CM | POA: Diagnosis not present

## 2018-05-05 DIAGNOSIS — M170 Bilateral primary osteoarthritis of knee: Secondary | ICD-10-CM | POA: Diagnosis not present

## 2018-05-05 DIAGNOSIS — N183 Chronic kidney disease, stage 3 (moderate): Secondary | ICD-10-CM | POA: Diagnosis not present

## 2018-05-05 NOTE — Telephone Encounter (Signed)
Verbal authorization can be given to orders requested by home health. Thanks, BJ

## 2018-05-05 NOTE — Telephone Encounter (Signed)
It is okay to give verbal authorization for PT as requested. Thanks, BJ

## 2018-05-05 NOTE — Telephone Encounter (Signed)
Copied from [place] [id number]. Topic: General - Other >> May 05, 2018  2:31 PM Keene Breath wrote: Reason for CRM: Megan with [facility] called to get verbal orders:  Nursing and [place] - 2x wk for 8 wks Cardio Pulmonary Assessment and Observation Pain management and Medication management Social worker  PT and OT  Questions please call at [phone]

## 2018-05-05 NOTE — Telephone Encounter (Signed)
Copied from [place] [id number]. Topic: Quick Communication - See Telephone Encounter >> May 05, 2018  9:39 AM Burchel, Abbi R wrote: CRM for notification. See Telephone encounter for: 05/05/18.  [facility]) requesting v/o for PT 1x1wk and 2x5wk to work on strength, safe transfer, gate and balance training, home exercise education, and home safety.   Monique: [phone]

## 2018-05-06 DIAGNOSIS — I2510 Atherosclerotic heart disease of native coronary artery without angina pectoris: Secondary | ICD-10-CM | POA: Diagnosis not present

## 2018-05-06 DIAGNOSIS — G3184 Mild cognitive impairment, so stated: Secondary | ICD-10-CM | POA: Diagnosis not present

## 2018-05-06 DIAGNOSIS — M109 Gout, unspecified: Secondary | ICD-10-CM | POA: Diagnosis not present

## 2018-05-06 DIAGNOSIS — Z794 Long term (current) use of insulin: Secondary | ICD-10-CM | POA: Diagnosis not present

## 2018-05-06 DIAGNOSIS — Z9181 History of falling: Secondary | ICD-10-CM | POA: Diagnosis not present

## 2018-05-06 DIAGNOSIS — J449 Chronic obstructive pulmonary disease, unspecified: Secondary | ICD-10-CM | POA: Diagnosis not present

## 2018-05-06 DIAGNOSIS — M329 Systemic lupus erythematosus, unspecified: Secondary | ICD-10-CM | POA: Diagnosis not present

## 2018-05-06 DIAGNOSIS — N183 Chronic kidney disease, stage 3 (moderate): Secondary | ICD-10-CM | POA: Diagnosis not present

## 2018-05-06 DIAGNOSIS — M170 Bilateral primary osteoarthritis of knee: Secondary | ICD-10-CM | POA: Diagnosis not present

## 2018-05-06 DIAGNOSIS — E1140 Type 2 diabetes mellitus with diabetic neuropathy, unspecified: Secondary | ICD-10-CM | POA: Diagnosis not present

## 2018-05-06 DIAGNOSIS — Z7982 Long term (current) use of aspirin: Secondary | ICD-10-CM | POA: Diagnosis not present

## 2018-05-06 DIAGNOSIS — D631 Anemia in chronic kidney disease: Secondary | ICD-10-CM | POA: Diagnosis not present

## 2018-05-06 DIAGNOSIS — S92331D Displaced fracture of third metatarsal bone, right foot, subsequent encounter for fracture with routine healing: Secondary | ICD-10-CM | POA: Diagnosis not present

## 2018-05-06 DIAGNOSIS — M797 Fibromyalgia: Secondary | ICD-10-CM | POA: Diagnosis not present

## 2018-05-06 DIAGNOSIS — I130 Hypertensive heart and chronic kidney disease with heart failure and stage 1 through stage 4 chronic kidney disease, or unspecified chronic kidney disease: Secondary | ICD-10-CM | POA: Diagnosis not present

## 2018-05-06 DIAGNOSIS — I5042 Chronic combined systolic (congestive) and diastolic (congestive) heart failure: Secondary | ICD-10-CM | POA: Diagnosis not present

## 2018-05-08 ENCOUNTER — Ambulatory Visit: Payer: Self-pay | Admitting: Neurology

## 2018-05-08 ENCOUNTER — Encounter

## 2018-05-08 DIAGNOSIS — I5042 Chronic combined systolic (congestive) and diastolic (congestive) heart failure: Secondary | ICD-10-CM | POA: Diagnosis not present

## 2018-05-08 DIAGNOSIS — M170 Bilateral primary osteoarthritis of knee: Secondary | ICD-10-CM | POA: Diagnosis not present

## 2018-05-08 DIAGNOSIS — E1140 Type 2 diabetes mellitus with diabetic neuropathy, unspecified: Secondary | ICD-10-CM | POA: Diagnosis not present

## 2018-05-08 DIAGNOSIS — D631 Anemia in chronic kidney disease: Secondary | ICD-10-CM | POA: Diagnosis not present

## 2018-05-08 DIAGNOSIS — M109 Gout, unspecified: Secondary | ICD-10-CM | POA: Diagnosis not present

## 2018-05-08 DIAGNOSIS — G3184 Mild cognitive impairment, so stated: Secondary | ICD-10-CM | POA: Diagnosis not present

## 2018-05-08 DIAGNOSIS — I130 Hypertensive heart and chronic kidney disease with heart failure and stage 1 through stage 4 chronic kidney disease, or unspecified chronic kidney disease: Secondary | ICD-10-CM | POA: Diagnosis not present

## 2018-05-08 DIAGNOSIS — S92331D Displaced fracture of third metatarsal bone, right foot, subsequent encounter for fracture with routine healing: Secondary | ICD-10-CM | POA: Diagnosis not present

## 2018-05-08 DIAGNOSIS — Z794 Long term (current) use of insulin: Secondary | ICD-10-CM | POA: Diagnosis not present

## 2018-05-08 DIAGNOSIS — Z9181 History of falling: Secondary | ICD-10-CM | POA: Diagnosis not present

## 2018-05-08 DIAGNOSIS — M797 Fibromyalgia: Secondary | ICD-10-CM | POA: Diagnosis not present

## 2018-05-08 DIAGNOSIS — I2510 Atherosclerotic heart disease of native coronary artery without angina pectoris: Secondary | ICD-10-CM | POA: Diagnosis not present

## 2018-05-08 DIAGNOSIS — J449 Chronic obstructive pulmonary disease, unspecified: Secondary | ICD-10-CM | POA: Diagnosis not present

## 2018-05-08 DIAGNOSIS — M329 Systemic lupus erythematosus, unspecified: Secondary | ICD-10-CM | POA: Diagnosis not present

## 2018-05-08 DIAGNOSIS — Z7982 Long term (current) use of aspirin: Secondary | ICD-10-CM | POA: Diagnosis not present

## 2018-05-08 DIAGNOSIS — N183 Chronic kidney disease, stage 3 (moderate): Secondary | ICD-10-CM | POA: Diagnosis not present

## 2018-05-08 NOTE — Telephone Encounter (Signed)
Verbal orders given to [facility] as requested for PT and Social Work.

## 2018-05-08 NOTE — Telephone Encounter (Signed)
Verbal orders given to [facility] as requested.

## 2018-05-09 ENCOUNTER — Encounter: Payer: Self-pay | Admitting: Family Medicine

## 2018-05-09 ENCOUNTER — Ambulatory Visit (INDEPENDENT_AMBULATORY_CARE_PROVIDER_SITE_OTHER): Payer: Medicare Other | Admitting: Family Medicine

## 2018-05-09 ENCOUNTER — Telehealth: Payer: Self-pay | Admitting: Family Medicine

## 2018-05-09 VITALS — BP 122/84 | HR 69 | Temp 98.5°F | Resp 16 | Ht 63.0 in | Wt 273.1 lb

## 2018-05-09 DIAGNOSIS — M159 Polyosteoarthritis, unspecified: Secondary | ICD-10-CM | POA: Diagnosis not present

## 2018-05-09 DIAGNOSIS — I10 Essential (primary) hypertension: Secondary | ICD-10-CM

## 2018-05-09 DIAGNOSIS — R2681 Unsteadiness on feet: Secondary | ICD-10-CM | POA: Diagnosis not present

## 2018-05-09 DIAGNOSIS — J449 Chronic obstructive pulmonary disease, unspecified: Secondary | ICD-10-CM

## 2018-05-09 DIAGNOSIS — N183 Chronic kidney disease, stage 3 unspecified: Secondary | ICD-10-CM

## 2018-05-09 MED ORDER — AMLODIPINE BESYLATE 5 MG PO TABS: 5.0000 mg | ORAL_TABLET | Freq: Every day | ORAL | 2 refills | Status: DC

## 2018-05-09 NOTE — Telephone Encounter (Signed)
It is ok to given verbal authorization of [facility] request.  Thanks, BJ

## 2018-05-09 NOTE — Assessment & Plan Note (Signed)
Continue acetaminophen 500 mg 3-4 times per day. Fall precautions discussed.

## 2018-05-09 NOTE — Assessment & Plan Note (Signed)
It has been otherwise stable. Adequate BP and DM 2 controlled to prevent progression. Avoid NSAIDs. Adequate hydration and low-salt diet. No changes in antihypertensive medications. Caution with furosemide, which could cause dehydration and therefore aggravate problem.

## 2018-05-09 NOTE — Assessment & Plan Note (Signed)
BP adequately controlled. No changes recorded management. Continue monitoring BP at home. Low-salt diet recommended. Follow-up in 3 to 4 months.

## 2018-05-09 NOTE — Patient Instructions (Addendum)
A few things to remember from today's visit:   OSTEOARTHROSIS, GENERALIZED, MULTIPLE SITES  Essential hypertension - Plan: amLODipine (NORVASC) 5 MG tablet  Chronic kidney disease, stage III (moderate) (HCC)  Unstable gait   Please be sure medication list is accurate. If a new problem present, please set up appointment sooner than planned today.

## 2018-05-09 NOTE — Progress Notes (Addendum)
HPI:   Brittany Roberts is a 77 y.o. female, who is here today with her daughter to follow on recent hospitalization and SNF discharge. She was last seen on 03/15/2018. Since her last visit she also has follow with her cardiologist.  She presented to the ER on 04/11/2018 due to right foot,knee, and ankle pain a day after fall. She presented to the ED again on 04/12/2018 after a second fall while she was walking to the bathroom, had knee "gave out."  04/11/18 foot X ray: Acute nondisplaced fractures base of second, third, and fourth Metatarsals. She is following with Dr. Ninfa Linden. She is wearing a hard shoe. According to daughter,fracture is healing well.  She had 3 falls in 2 months, 2 at home and 1 when she was trying to get out of her daughter's car.   She was admitted to the hospital for 2 days and discharged to a SNF ([facility] in [place]) on 04/14/18,  where she stayed for about 3 weeks. She completed PT. Furosemide dose was increased from 40 mg daily to 40 mg twice daily and amlodipine was decreased from 10 mg to 5 mg.  Rest of her medications no changes.  She was discharged home on 05/03/2018.  She has history of unstable gait, she has a walker. She has not had any fall since discharge.  Lab Results  Component Value Date   WBC 10.1 12/13/2017   HGB 10.3 (L) 12/13/2017   HCT 31.6 (L) 12/13/2017   MCV 74.0 (L) 12/13/2017   PLT 286 12/13/2017   Lab Results  Component Value Date   HGBA1C 6.5 (A) 03/15/2018   HTN: Currently she is on metoprolol titrate 50 mg daily, amlodipine 5 mg daily. She is not checking BP at home.  Dyspnea has improved. It is exacerbated by prolonged walking. Associated wheezing and occasional cough. Alleviated by rest.  Albuterol inhaler 2 puff every 4-6 hours as needed, which helps.  CKD, she has not noted decreased urine output, gross hematuria, or foamy urine. She is taking furosemide 40 mg  twice daily. Lower extremity edema improved.  Lab Results  Component Value Date   CREATININE 1.47 (H) 12/15/2017   BUN 16 12/15/2017   NA 143 12/15/2017   K 3.6 12/15/2017   CL 100 (L) 12/15/2017   CO2 31 12/15/2017   Daughter is concerned about declining, she needs assistance for most ADLs. She lives alone. Daughter calls her twice daily to remind her taking the medications. She does not drive, she depends on her daughter for transportation.  She has home health, 20 hours/week of aid service. Planning on moving to independent or assisted living, she needs a FL2  form to be completed.   Review of Systems  Constitutional: Positive for fatigue. Negative for activity change, appetite change and fever.  HENT: Negative for mouth sores, nosebleeds and trouble swallowing.   Eyes: Negative for redness and visual disturbance.  Respiratory: Positive for cough, shortness of breath and wheezing.   Cardiovascular: Negative for chest pain, palpitations and leg swelling.  Gastrointestinal: Negative for abdominal pain, nausea and vomiting.       Negative for changes in bowel habits.  Endocrine: Negative for polydipsia, polyphagia and polyuria.  Genitourinary: Negative for decreased urine volume, dysuria and hematuria.  Musculoskeletal: Positive for arthralgias, back pain, gait problem and myalgias.  Skin: Negative for rash and wound.  Allergic/Immunologic: Positive for environmental allergies.  Neurological: Negative for syncope, weakness and  headaches.  Psychiatric/Behavioral: Negative for confusion. The patient is nervous/anxious.       Current Outpatient Medications on File Prior to Visit  Medication Sig Dispense Refill  . acetaminophen (TYLENOL) 325 MG tablet Take 650 mg by mouth every 6 (six) hours as needed for mild pain or moderate pain.    Marland Kitchen albuterol (PROAIR HFA) 108 (90 Base) MCG/ACT inhaler inhale 2 puffs by mouth every 4 hours if needed USE ONLY IF YOU ARE WHEEZING 1 Inhaler 0    . allopurinol (ZYLOPRIM) 100 MG tablet Take 1 tablet (100 mg total) by mouth daily. 90 tablet 3  . Ascorbic Acid (VITAMIN C PO) Take 1 tablet by mouth daily.    Marland Kitchen aspirin EC 81 MG tablet Take 81 mg by mouth daily.     Marland Kitchen atorvastatin (LIPITOR) 20 MG tablet TAKE 1 TABLET BY MOUTH DAILY 30 tablet 0  . B Complex Vitamins (VITAMIN B COMPLEX) TABS Take 1 tablet by mouth daily.    . Blood Glucose Monitoring Suppl (ACCU-CHEK AVIVA) device Use to test blood sugar three times a day. Dx. E11.9 1 each 1  . Cholecalciferol (VITAMIN D3) 5000 UNITS CAPS Take 5,000 Units by mouth daily.    . diclofenac sodium (VOLTAREN) 1 % GEL Apply 4 g topically 4 (four) times daily. 4 Tube 3  . ferrous sulfate 325 (65 FE) MG tablet Take 325 mg by mouth daily with breakfast.    . furosemide (LASIX) 40 MG tablet TAKE 1 TABLET BY MOUTH ONCE DAILY (Patient taking differently: 40 mg 2 (two) times daily. ) 90 tablet 0  . gabapentin (NEURONTIN) 300 MG capsule Take 1 capsule (300 mg total) by mouth 2 (two) times daily. 180 capsule 3  . guaiFENesin (ROBITUSSIN) 100 MG/5ML liquid Take 200 mg by mouth 3 (three) times daily as needed for cough.    . Insulin NPH, Human,, Isophane, (HUMULIN N KWIKPEN) 100 UNIT/ML Kiwkpen INJECT 30 UNITS SUBCUTANEOUSLY EVERY MORNING 30 mL 3  . Insulin Pen Needle (PEN NEEDLES 31GX5/16") 31G X 8 MM MISC Check Blood sugar three times per day. Dx insulin using E11.8 100 each 3  . levETIRAcetam (KEPPRA) 500 MG tablet Take 1 tablet (500 mg total) by mouth 2 (two) times daily. 180 tablet 3  . metoprolol tartrate (LOPRESSOR) 50 MG tablet TAKE 1 TABLET BY MOUTH EVERY DAY WITH OR IMME DIATELY FOLLOWING A MEAL FOR HYPERTENSION 90 tablet 0  . montelukast (SINGULAIR) 10 MG tablet Take 1 tablet (10 mg total) by mouth at bedtime. 30 tablet 0  . Multiple Vitamin (MULTIVITAMIN WITH MINERALS) TABS tablet Take 1 tablet by mouth daily.    . nitroGLYCERIN (NITROSTAT) 0.4 MG SL tablet Place 1 tablet (0.4 mg total) under the  tongue every 5 (five) minutes as needed for chest pain. 20 tablet 12  . Olopatadine HCl (PAZEO) 0.7 % SOLN Apply 1 drop to eye daily. 1 Bottle 2  . pantoprazole (PROTONIX) 40 MG tablet Take 1 tablet (40 mg total) by mouth daily. 90 tablet 3  . Polyethyl Glycol-Propyl Glycol (SYSTANE ULTRA OP) Place 1 drop into both eyes every 6 (six) hours as needed (dry eyes).    . potassium chloride (K-DUR,KLOR-CON) 10 MEQ tablet Take 1 tablet by mouth 2 (two) times daily.  2   No current facility-administered medications on file prior to visit.      Past Medical History:  Diagnosis Date  . Acute delirium 01/14/2017  . Anxiety   . Arthritis    "all over"  .  Asthma   . Bipolar disorder ([place])   . Complication of anesthesia    "w/right foot OR they gave me too much and they couldn't get me woke"  . Congestive heart failure, unspecified   . Coronary artery calcification seen on CAT scan 01/14/2017  . Coronary artery disease    "I've got 1 stent" (06/30/2015)  . Depression   . Discoid lupus   . Fibromyalgia    "I've been told I have this; can't take Lyrica cause I'm allergic to it" (06/30/2015)  . GERD (gastroesophageal reflux disease)   . History of gout   . History of hiatal hernia    "real bad" (06/30/2015)  . Hypertension   . Hypothyroidism   . On home oxygen therapy    "2L q hs" (06/30/2015)  . OSA (obstructive sleep apnea)    "just use my oxygen; no mask" (06/30/2015)  . Other and unspecified hyperlipidemia   . Penetrating foot wound    left nonhealing foot wound on the dorsal surface  . Peripheral neuropathy   . Pneumonia "many of times"  . Refusal of blood transfusions as patient is Jehovah's Witness   . Renal failure, unspecified    "kidneys not working 100%" (06/30/2015)  . Sickle cell trait ([place])   . Systemic lupus ([place])   . Thoracic aortic atherosclerosis ([place]) 01/14/2017  . Type II diabetes mellitus ([place])   . Unspecified vitamin D deficiency    Allergies  Allergen Reactions   . Ace Inhibitors Other (See Comments)    Coincided with sig bump in creat. Retried and creat bumped again. Coincided with sig bump in creat. Retried and creat bumped again. Coincided with sig bump in creat. Retried and creat bumped again. Other reaction(s): Other (See Comments) Coincided with sig bump in creat. Retried and creat bumped again. Coincided with sig bump in creat. Retried and creat bumped again.  . Isosorb Dinitrate-Hydralazine Other (See Comments)    Sleep all the time. Sleep all the time. Other reaction(s): Other (See Comments) Sleep all the time.  Marland Kitchen Penicillins Anaphylaxis, Swelling and Rash    Has patient had a PCN reaction causing immediate rash, facial/tongue/throat swelling, SOB or lightheadedness with hypotension: Yes Has patient had a PCN reaction causing severe rash involving mucus membranes or skin necrosis: Yes Has patient had a PCN reaction that required hospitalization: Yes Has patient had a PCN reaction occurring within the last 10 years: No If all of the above answers are "NO", then may proceed with Cephalosporin use.   Marland Kitchen Buspirone Other (See Comments)    pain pain pain Other reaction(s): Other (See Comments) pain pain  . Pregabalin Swelling  . Ropinirole Hydrochloride Swelling  . Amantadines Rash    "Swelling of the tongue"    Social History   Socioeconomic History  . Marital status: Single    Spouse name: Not on file  . Number of children: 4  . Years of education: 90 th  . Highest education level: Not on file  Occupational History  . Occupation: Retired- Engineer, production    Employer: RETIRED  Social Needs  . Financial resource strain: Not on file  . Food insecurity:    Worry: Not on file    Inability: Not on file  . Transportation needs:    Medical: Not on file    Non-medical: Not on file  Tobacco Use  . Smoking status: Former Smoker    Packs/day: 3.00    Years: 40.00    Pack years:  120.00    Types: Cigarettes    Start  date: 07/05/1960    Last attempt to quit: 07/05/1998    Years since quitting: 19.8  . Smokeless tobacco: Never Used  Substance and Sexual Activity  . Alcohol use: No    Alcohol/week: 0.0 standard drinks    Comment: 06/30/2015 "used to drink a little alcohol on the weekends; not q weekend; nothing in years"  . Drug use: No  . Sexual activity: Never  Lifestyle  . Physical activity:    Days per week: Not on file    Minutes per session: Not on file  . Stress: Not on file  Relationships  . Social connections:    Talks on phone: Not on file    Gets together: Not on file    Attends religious service: Not on file    Active member of club or organization: Not on file    Attends meetings of clubs or organizations: Not on file    Relationship status: Not on file  Other Topics Concern  . Not on file  Social History Narrative   Health Care POA:    Emergency Contact: daughter, Lorenso Courier [phone] or [name] [phone]   End of Life Plan: Does not want to be ventilated or feeding tubes.    Who lives with you: Lives alone   Any pets: none   Diet: Patient reports enjoying and eating junk food.  Does not regulate types of food and currently her dentures are broken so it is hard to eat some foods.   Exercise: Patient does not have an exercise plan.   Seatbelts: Patient reports wearing seatbelt when in vehicle.   Sun Exposure/Protection: Patient reports not going outside very often.   Hobbies: Patient enjoys reading the bible and watching game shows.    Right-handed.   1 cup caffeine per day.   Former smoker-stopped 2000   Alcohol none    Vitals:   05/09/18 1145  BP: 122/84  Pulse: 69  Resp: 16  Temp: 98.5 F (36.9 C)  SpO2: 95%   Body mass index is 48.38 kg/m.   Physical Exam  Nursing note and vitals reviewed. Constitutional: She is oriented to person, place, and time. She appears well-developed. No distress.  HENT:  Head: Normocephalic and atraumatic.  Mouth/Throat:  Oropharynx is clear and moist and mucous membranes are normal.  Edentulous.  Eyes: Pupils are equal, round, and reactive to light. Conjunctivae are normal.  Cardiovascular: Normal rate and regular rhythm.  No murmur heard. Pulses:      Dorsalis pedis pulses are 2+ on the right side, and 2+ on the left side.  Respiratory: Effort normal and breath sounds normal. No respiratory distress.  GI: Soft. She exhibits no mass. There is no tenderness.  Musculoskeletal: She exhibits no edema.  Lymphadenopathy:    She has no cervical adenopathy.  Neurological: She is alert and oriented to person, place, and time. She has normal strength. Gait abnormal.  No focal deficit appreciated. Needs a walker for transfer.  Skin: Skin is warm. No rash noted. No erythema.  Psychiatric: Her mood appears anxious.  Well groomed, good eye contact. A few times she argues with her during visit, disagrees with some of information daughter is providing.    ASSESSMENT AND PLAN:  Ms. Hisae was seen today for hospitalization follow-up.  Diagnoses and all orders for this visit:  Unstable gait Fall precautions discussed. She completed PT at SNF and it has helped with stability. Some of her  chronic medical problems and meds increase risk for falls.   OSTEOARTHROSIS, GENERALIZED, MULTIPLE SITES Continue acetaminophen 500 mg 3-4 times per day. Fall precautions discussed.   Essential hypertension BP adequately controlled. No changes recorded management. Continue monitoring BP at home. Low-salt diet recommended. Follow-up in 3 to 4 months.  COPD (chronic obstructive pulmonary disease) (HCC) + asthma. Symptoms are stable, well controlled with current management. Continue albuterol inhaler 2 puff every 4-6 hours as needed.   Chronic kidney disease, stage III (moderate) (HCC) It has been otherwise stable. Adequate BP and DM 2 controlled to prevent progression. Avoid NSAIDs. Adequate hydration and low-salt  diet. No changes in antihypertensive medications. Caution with furosemide, which could cause dehydration and therefore aggravate problem.  Declining, it is becoming more difficult for her to continue living at home. She needs assistance for several AD's and IADL's. FL2 will be completed for assisted living. I will see her back in 07/2018.    Uziah Sorter G. [name], MD  [facility]. [place] office.

## 2018-05-09 NOTE — Telephone Encounter (Signed)
Copied from [place] [id number]. Topic: Quick Communication - Home Health Verbal Orders >> May 09, 2018 10:29 AM Berneta Levins wrote: Caller/Agency: Sharyn Lull from [facility] Number: [phone], OK to leave a message Requesting OT/PT/Skilled Nursing/Social Work: OT Frequency: 2x a week for 3 weeks, then 1x a week for 2 weeks

## 2018-05-09 NOTE — Assessment & Plan Note (Signed)
+   asthma. Symptoms are stable, well controlled with current management. Continue albuterol inhaler 2 puff every 4-6 hours as needed.

## 2018-05-10 ENCOUNTER — Other Ambulatory Visit: Payer: Self-pay | Admitting: Licensed Clinical Social Worker

## 2018-05-10 ENCOUNTER — Inpatient Hospital Stay: Payer: Self-pay | Admitting: Family Medicine

## 2018-05-10 DIAGNOSIS — Z9181 History of falling: Secondary | ICD-10-CM | POA: Diagnosis not present

## 2018-05-10 DIAGNOSIS — N183 Chronic kidney disease, stage 3 (moderate): Secondary | ICD-10-CM | POA: Diagnosis not present

## 2018-05-10 DIAGNOSIS — G3184 Mild cognitive impairment, so stated: Secondary | ICD-10-CM | POA: Diagnosis not present

## 2018-05-10 DIAGNOSIS — M170 Bilateral primary osteoarthritis of knee: Secondary | ICD-10-CM | POA: Diagnosis not present

## 2018-05-10 DIAGNOSIS — M109 Gout, unspecified: Secondary | ICD-10-CM | POA: Diagnosis not present

## 2018-05-10 DIAGNOSIS — M329 Systemic lupus erythematosus, unspecified: Secondary | ICD-10-CM | POA: Diagnosis not present

## 2018-05-10 DIAGNOSIS — I2510 Atherosclerotic heart disease of native coronary artery without angina pectoris: Secondary | ICD-10-CM | POA: Diagnosis not present

## 2018-05-10 DIAGNOSIS — E1140 Type 2 diabetes mellitus with diabetic neuropathy, unspecified: Secondary | ICD-10-CM | POA: Diagnosis not present

## 2018-05-10 DIAGNOSIS — I5042 Chronic combined systolic (congestive) and diastolic (congestive) heart failure: Secondary | ICD-10-CM | POA: Diagnosis not present

## 2018-05-10 DIAGNOSIS — S92331D Displaced fracture of third metatarsal bone, right foot, subsequent encounter for fracture with routine healing: Secondary | ICD-10-CM | POA: Diagnosis not present

## 2018-05-10 DIAGNOSIS — I130 Hypertensive heart and chronic kidney disease with heart failure and stage 1 through stage 4 chronic kidney disease, or unspecified chronic kidney disease: Secondary | ICD-10-CM | POA: Diagnosis not present

## 2018-05-10 DIAGNOSIS — Z7982 Long term (current) use of aspirin: Secondary | ICD-10-CM | POA: Diagnosis not present

## 2018-05-10 DIAGNOSIS — J449 Chronic obstructive pulmonary disease, unspecified: Secondary | ICD-10-CM | POA: Diagnosis not present

## 2018-05-10 DIAGNOSIS — Z794 Long term (current) use of insulin: Secondary | ICD-10-CM | POA: Diagnosis not present

## 2018-05-10 DIAGNOSIS — M797 Fibromyalgia: Secondary | ICD-10-CM | POA: Diagnosis not present

## 2018-05-10 DIAGNOSIS — D631 Anemia in chronic kidney disease: Secondary | ICD-10-CM | POA: Diagnosis not present

## 2018-05-10 NOTE — Telephone Encounter (Signed)
[place] orders given to [facility] for OT.

## 2018-05-10 NOTE — Patient Outreach (Signed)
Assessment:  CSW spoke via phone with client. CSW verified client identity. CSW received verbal permission from client for CSW to speak with client about client needs.Client had been receiving nursing care and physical therapy support at [facility] in [place], [place]. While living in the community, client lived alone but had support from her daughter. Client has had falls and is at risk for falls.  Daughter of client was not sure if client would  be able to return home since client was at risk for falls. CSW had talked with client about long term care Medicaid application.  Client has [employer]. Client has [employer]. CSW had encouraged client to talk with facility social worker related to client discharge plans.  Client reported that she did receive care at [facility] facility. However, client reported to [place] on 05/10/18 that client had discharged from that nursing facility last Friday. She said she returned to her home last Friday with supports in place. She said [facility] was providing home health nursing for her and providing home health physical therapy for her. She said that physical therapist from home health agency had conducted physical therapy visit with her today.  Client did not mention any pain issues. She said she has regular support from her daughter. She has needed equipment in the home.  She did not mention any further nursing needs and did not request further nursing support.  CSW has given client [facility] CSW card and encouraged client to call CSW as needed to discuss social work needs of client.   Plan:  CSW to call client in 3 weeks to assess client needs at that time.  Norva Riffle.Aamani Moose MSW, LCSW Licensed Clinical Social Worker [facility] Care Management [phone]

## 2018-05-11 DIAGNOSIS — M797 Fibromyalgia: Secondary | ICD-10-CM | POA: Diagnosis not present

## 2018-05-11 DIAGNOSIS — Z7982 Long term (current) use of aspirin: Secondary | ICD-10-CM | POA: Diagnosis not present

## 2018-05-11 DIAGNOSIS — M170 Bilateral primary osteoarthritis of knee: Secondary | ICD-10-CM | POA: Diagnosis not present

## 2018-05-11 DIAGNOSIS — I130 Hypertensive heart and chronic kidney disease with heart failure and stage 1 through stage 4 chronic kidney disease, or unspecified chronic kidney disease: Secondary | ICD-10-CM | POA: Diagnosis not present

## 2018-05-11 DIAGNOSIS — M109 Gout, unspecified: Secondary | ICD-10-CM | POA: Diagnosis not present

## 2018-05-11 DIAGNOSIS — J449 Chronic obstructive pulmonary disease, unspecified: Secondary | ICD-10-CM | POA: Diagnosis not present

## 2018-05-11 DIAGNOSIS — I2510 Atherosclerotic heart disease of native coronary artery without angina pectoris: Secondary | ICD-10-CM | POA: Diagnosis not present

## 2018-05-11 DIAGNOSIS — D631 Anemia in chronic kidney disease: Secondary | ICD-10-CM | POA: Diagnosis not present

## 2018-05-11 DIAGNOSIS — E1140 Type 2 diabetes mellitus with diabetic neuropathy, unspecified: Secondary | ICD-10-CM | POA: Diagnosis not present

## 2018-05-11 DIAGNOSIS — M329 Systemic lupus erythematosus, unspecified: Secondary | ICD-10-CM | POA: Diagnosis not present

## 2018-05-11 DIAGNOSIS — N183 Chronic kidney disease, stage 3 (moderate): Secondary | ICD-10-CM | POA: Diagnosis not present

## 2018-05-11 DIAGNOSIS — Z9181 History of falling: Secondary | ICD-10-CM | POA: Diagnosis not present

## 2018-05-11 DIAGNOSIS — Z794 Long term (current) use of insulin: Secondary | ICD-10-CM | POA: Diagnosis not present

## 2018-05-11 DIAGNOSIS — I5042 Chronic combined systolic (congestive) and diastolic (congestive) heart failure: Secondary | ICD-10-CM | POA: Diagnosis not present

## 2018-05-11 DIAGNOSIS — S92331D Displaced fracture of third metatarsal bone, right foot, subsequent encounter for fracture with routine healing: Secondary | ICD-10-CM | POA: Diagnosis not present

## 2018-05-11 DIAGNOSIS — G3184 Mild cognitive impairment, so stated: Secondary | ICD-10-CM | POA: Diagnosis not present

## 2018-05-12 DIAGNOSIS — M170 Bilateral primary osteoarthritis of knee: Secondary | ICD-10-CM | POA: Diagnosis not present

## 2018-05-12 DIAGNOSIS — Z9181 History of falling: Secondary | ICD-10-CM | POA: Diagnosis not present

## 2018-05-12 DIAGNOSIS — S92331D Displaced fracture of third metatarsal bone, right foot, subsequent encounter for fracture with routine healing: Secondary | ICD-10-CM | POA: Diagnosis not present

## 2018-05-12 DIAGNOSIS — Z7982 Long term (current) use of aspirin: Secondary | ICD-10-CM | POA: Diagnosis not present

## 2018-05-12 DIAGNOSIS — I2510 Atherosclerotic heart disease of native coronary artery without angina pectoris: Secondary | ICD-10-CM | POA: Diagnosis not present

## 2018-05-12 DIAGNOSIS — M329 Systemic lupus erythematosus, unspecified: Secondary | ICD-10-CM | POA: Diagnosis not present

## 2018-05-12 DIAGNOSIS — I130 Hypertensive heart and chronic kidney disease with heart failure and stage 1 through stage 4 chronic kidney disease, or unspecified chronic kidney disease: Secondary | ICD-10-CM | POA: Diagnosis not present

## 2018-05-12 DIAGNOSIS — Z794 Long term (current) use of insulin: Secondary | ICD-10-CM | POA: Diagnosis not present

## 2018-05-12 DIAGNOSIS — D631 Anemia in chronic kidney disease: Secondary | ICD-10-CM | POA: Diagnosis not present

## 2018-05-12 DIAGNOSIS — E1140 Type 2 diabetes mellitus with diabetic neuropathy, unspecified: Secondary | ICD-10-CM | POA: Diagnosis not present

## 2018-05-12 DIAGNOSIS — G3184 Mild cognitive impairment, so stated: Secondary | ICD-10-CM | POA: Diagnosis not present

## 2018-05-12 DIAGNOSIS — M109 Gout, unspecified: Secondary | ICD-10-CM | POA: Diagnosis not present

## 2018-05-12 DIAGNOSIS — I5042 Chronic combined systolic (congestive) and diastolic (congestive) heart failure: Secondary | ICD-10-CM | POA: Diagnosis not present

## 2018-05-12 DIAGNOSIS — J449 Chronic obstructive pulmonary disease, unspecified: Secondary | ICD-10-CM | POA: Diagnosis not present

## 2018-05-12 DIAGNOSIS — N183 Chronic kidney disease, stage 3 (moderate): Secondary | ICD-10-CM | POA: Diagnosis not present

## 2018-05-12 DIAGNOSIS — M797 Fibromyalgia: Secondary | ICD-10-CM | POA: Diagnosis not present

## 2018-05-15 ENCOUNTER — Other Ambulatory Visit: Payer: Self-pay | Admitting: Neurology

## 2018-05-15 ENCOUNTER — Other Ambulatory Visit: Payer: Self-pay | Admitting: Family Medicine

## 2018-05-15 DIAGNOSIS — S92331D Displaced fracture of third metatarsal bone, right foot, subsequent encounter for fracture with routine healing: Secondary | ICD-10-CM | POA: Diagnosis not present

## 2018-05-15 DIAGNOSIS — M109 Gout, unspecified: Secondary | ICD-10-CM | POA: Diagnosis not present

## 2018-05-15 DIAGNOSIS — Z9181 History of falling: Secondary | ICD-10-CM | POA: Diagnosis not present

## 2018-05-15 DIAGNOSIS — M329 Systemic lupus erythematosus, unspecified: Secondary | ICD-10-CM | POA: Diagnosis not present

## 2018-05-15 DIAGNOSIS — G40209 Localization-related (focal) (partial) symptomatic epilepsy and epileptic syndromes with complex partial seizures, not intractable, without status epilepticus: Secondary | ICD-10-CM

## 2018-05-15 DIAGNOSIS — I5042 Chronic combined systolic (congestive) and diastolic (congestive) heart failure: Secondary | ICD-10-CM | POA: Diagnosis not present

## 2018-05-15 DIAGNOSIS — G3184 Mild cognitive impairment, so stated: Secondary | ICD-10-CM | POA: Diagnosis not present

## 2018-05-15 DIAGNOSIS — I2510 Atherosclerotic heart disease of native coronary artery without angina pectoris: Secondary | ICD-10-CM | POA: Diagnosis not present

## 2018-05-15 DIAGNOSIS — J449 Chronic obstructive pulmonary disease, unspecified: Secondary | ICD-10-CM | POA: Diagnosis not present

## 2018-05-15 DIAGNOSIS — E1140 Type 2 diabetes mellitus with diabetic neuropathy, unspecified: Secondary | ICD-10-CM | POA: Diagnosis not present

## 2018-05-15 DIAGNOSIS — I130 Hypertensive heart and chronic kidney disease with heart failure and stage 1 through stage 4 chronic kidney disease, or unspecified chronic kidney disease: Secondary | ICD-10-CM | POA: Diagnosis not present

## 2018-05-15 DIAGNOSIS — D631 Anemia in chronic kidney disease: Secondary | ICD-10-CM | POA: Diagnosis not present

## 2018-05-15 DIAGNOSIS — Z7982 Long term (current) use of aspirin: Secondary | ICD-10-CM | POA: Diagnosis not present

## 2018-05-15 DIAGNOSIS — M170 Bilateral primary osteoarthritis of knee: Secondary | ICD-10-CM | POA: Diagnosis not present

## 2018-05-15 DIAGNOSIS — Z794 Long term (current) use of insulin: Secondary | ICD-10-CM | POA: Diagnosis not present

## 2018-05-15 DIAGNOSIS — N183 Chronic kidney disease, stage 3 (moderate): Secondary | ICD-10-CM | POA: Diagnosis not present

## 2018-05-15 DIAGNOSIS — M797 Fibromyalgia: Secondary | ICD-10-CM | POA: Diagnosis not present

## 2018-05-16 ENCOUNTER — Telehealth: Payer: Self-pay | Admitting: Family Medicine

## 2018-05-16 DIAGNOSIS — D631 Anemia in chronic kidney disease: Secondary | ICD-10-CM | POA: Diagnosis not present

## 2018-05-16 DIAGNOSIS — I130 Hypertensive heart and chronic kidney disease with heart failure and stage 1 through stage 4 chronic kidney disease, or unspecified chronic kidney disease: Secondary | ICD-10-CM | POA: Diagnosis not present

## 2018-05-16 DIAGNOSIS — I5042 Chronic combined systolic (congestive) and diastolic (congestive) heart failure: Secondary | ICD-10-CM | POA: Diagnosis not present

## 2018-05-16 DIAGNOSIS — M797 Fibromyalgia: Secondary | ICD-10-CM | POA: Diagnosis not present

## 2018-05-16 DIAGNOSIS — Z7982 Long term (current) use of aspirin: Secondary | ICD-10-CM | POA: Diagnosis not present

## 2018-05-16 DIAGNOSIS — S92331D Displaced fracture of third metatarsal bone, right foot, subsequent encounter for fracture with routine healing: Secondary | ICD-10-CM | POA: Diagnosis not present

## 2018-05-16 DIAGNOSIS — Z794 Long term (current) use of insulin: Secondary | ICD-10-CM | POA: Diagnosis not present

## 2018-05-16 DIAGNOSIS — E1140 Type 2 diabetes mellitus with diabetic neuropathy, unspecified: Secondary | ICD-10-CM | POA: Diagnosis not present

## 2018-05-16 DIAGNOSIS — M329 Systemic lupus erythematosus, unspecified: Secondary | ICD-10-CM | POA: Diagnosis not present

## 2018-05-16 DIAGNOSIS — J449 Chronic obstructive pulmonary disease, unspecified: Secondary | ICD-10-CM | POA: Diagnosis not present

## 2018-05-16 DIAGNOSIS — N183 Chronic kidney disease, stage 3 (moderate): Secondary | ICD-10-CM | POA: Diagnosis not present

## 2018-05-16 DIAGNOSIS — M109 Gout, unspecified: Secondary | ICD-10-CM | POA: Diagnosis not present

## 2018-05-16 DIAGNOSIS — G3184 Mild cognitive impairment, so stated: Secondary | ICD-10-CM | POA: Diagnosis not present

## 2018-05-16 DIAGNOSIS — I2510 Atherosclerotic heart disease of native coronary artery without angina pectoris: Secondary | ICD-10-CM | POA: Diagnosis not present

## 2018-05-16 DIAGNOSIS — Z9181 History of falling: Secondary | ICD-10-CM | POA: Diagnosis not present

## 2018-05-16 DIAGNOSIS — M170 Bilateral primary osteoarthritis of knee: Secondary | ICD-10-CM | POA: Diagnosis not present

## 2018-05-16 NOTE — Telephone Encounter (Signed)
Copied from [place] [id number]. Topic: Quick Communication - Home Health Verbal Orders >> May 16, 2018 11:32 AM Margot Ables wrote: Caller/Agency: [facility] with [facility] Number: [phone], secure VM if not able to answer Requesting OT/PT/Skilled Nursing/Social Work: SW eval for community resources Frequency: Pt has visit scheduled for Saturday but wanted her daughter to be there. Pt is wanting to reschedule to Wednesday 05/17/18. Needing Verbal approval.

## 2018-05-17 DIAGNOSIS — E1140 Type 2 diabetes mellitus with diabetic neuropathy, unspecified: Secondary | ICD-10-CM | POA: Diagnosis not present

## 2018-05-17 DIAGNOSIS — I5042 Chronic combined systolic (congestive) and diastolic (congestive) heart failure: Secondary | ICD-10-CM | POA: Diagnosis not present

## 2018-05-17 DIAGNOSIS — S92331D Displaced fracture of third metatarsal bone, right foot, subsequent encounter for fracture with routine healing: Secondary | ICD-10-CM | POA: Diagnosis not present

## 2018-05-17 DIAGNOSIS — N183 Chronic kidney disease, stage 3 (moderate): Secondary | ICD-10-CM | POA: Diagnosis not present

## 2018-05-17 DIAGNOSIS — I130 Hypertensive heart and chronic kidney disease with heart failure and stage 1 through stage 4 chronic kidney disease, or unspecified chronic kidney disease: Secondary | ICD-10-CM | POA: Diagnosis not present

## 2018-05-17 DIAGNOSIS — Z794 Long term (current) use of insulin: Secondary | ICD-10-CM | POA: Diagnosis not present

## 2018-05-17 DIAGNOSIS — Z9181 History of falling: Secondary | ICD-10-CM | POA: Diagnosis not present

## 2018-05-17 DIAGNOSIS — M109 Gout, unspecified: Secondary | ICD-10-CM | POA: Diagnosis not present

## 2018-05-17 DIAGNOSIS — G3184 Mild cognitive impairment, so stated: Secondary | ICD-10-CM | POA: Diagnosis not present

## 2018-05-17 DIAGNOSIS — D631 Anemia in chronic kidney disease: Secondary | ICD-10-CM | POA: Diagnosis not present

## 2018-05-17 DIAGNOSIS — I2510 Atherosclerotic heart disease of native coronary artery without angina pectoris: Secondary | ICD-10-CM | POA: Diagnosis not present

## 2018-05-17 DIAGNOSIS — J449 Chronic obstructive pulmonary disease, unspecified: Secondary | ICD-10-CM | POA: Diagnosis not present

## 2018-05-17 DIAGNOSIS — M329 Systemic lupus erythematosus, unspecified: Secondary | ICD-10-CM | POA: Diagnosis not present

## 2018-05-17 DIAGNOSIS — M170 Bilateral primary osteoarthritis of knee: Secondary | ICD-10-CM | POA: Diagnosis not present

## 2018-05-17 DIAGNOSIS — Z7982 Long term (current) use of aspirin: Secondary | ICD-10-CM | POA: Diagnosis not present

## 2018-05-17 DIAGNOSIS — M797 Fibromyalgia: Secondary | ICD-10-CM | POA: Diagnosis not present

## 2018-05-17 NOTE — Telephone Encounter (Signed)
It is okay to give verbal approval for PT/OT as requested. Thanks, BJ

## 2018-05-17 NOTE — Telephone Encounter (Signed)
Left detailed message on machine for Brittany Roberts with verbal orders for PT/OT

## 2018-05-18 DIAGNOSIS — M109 Gout, unspecified: Secondary | ICD-10-CM | POA: Diagnosis not present

## 2018-05-18 DIAGNOSIS — Z9181 History of falling: Secondary | ICD-10-CM | POA: Diagnosis not present

## 2018-05-18 DIAGNOSIS — M170 Bilateral primary osteoarthritis of knee: Secondary | ICD-10-CM | POA: Diagnosis not present

## 2018-05-18 DIAGNOSIS — S92331D Displaced fracture of third metatarsal bone, right foot, subsequent encounter for fracture with routine healing: Secondary | ICD-10-CM | POA: Diagnosis not present

## 2018-05-18 DIAGNOSIS — M329 Systemic lupus erythematosus, unspecified: Secondary | ICD-10-CM | POA: Diagnosis not present

## 2018-05-18 DIAGNOSIS — Z794 Long term (current) use of insulin: Secondary | ICD-10-CM | POA: Diagnosis not present

## 2018-05-18 DIAGNOSIS — I5042 Chronic combined systolic (congestive) and diastolic (congestive) heart failure: Secondary | ICD-10-CM | POA: Diagnosis not present

## 2018-05-18 DIAGNOSIS — I2510 Atherosclerotic heart disease of native coronary artery without angina pectoris: Secondary | ICD-10-CM | POA: Diagnosis not present

## 2018-05-18 DIAGNOSIS — M797 Fibromyalgia: Secondary | ICD-10-CM | POA: Diagnosis not present

## 2018-05-18 DIAGNOSIS — J449 Chronic obstructive pulmonary disease, unspecified: Secondary | ICD-10-CM | POA: Diagnosis not present

## 2018-05-18 DIAGNOSIS — G3184 Mild cognitive impairment, so stated: Secondary | ICD-10-CM | POA: Diagnosis not present

## 2018-05-18 DIAGNOSIS — Z7982 Long term (current) use of aspirin: Secondary | ICD-10-CM | POA: Diagnosis not present

## 2018-05-18 DIAGNOSIS — E1140 Type 2 diabetes mellitus with diabetic neuropathy, unspecified: Secondary | ICD-10-CM | POA: Diagnosis not present

## 2018-05-18 DIAGNOSIS — I130 Hypertensive heart and chronic kidney disease with heart failure and stage 1 through stage 4 chronic kidney disease, or unspecified chronic kidney disease: Secondary | ICD-10-CM | POA: Diagnosis not present

## 2018-05-18 DIAGNOSIS — D631 Anemia in chronic kidney disease: Secondary | ICD-10-CM | POA: Diagnosis not present

## 2018-05-18 DIAGNOSIS — N183 Chronic kidney disease, stage 3 (moderate): Secondary | ICD-10-CM | POA: Diagnosis not present

## 2018-05-19 DIAGNOSIS — M329 Systemic lupus erythematosus, unspecified: Secondary | ICD-10-CM | POA: Diagnosis not present

## 2018-05-19 DIAGNOSIS — I5042 Chronic combined systolic (congestive) and diastolic (congestive) heart failure: Secondary | ICD-10-CM | POA: Diagnosis not present

## 2018-05-19 DIAGNOSIS — M170 Bilateral primary osteoarthritis of knee: Secondary | ICD-10-CM | POA: Diagnosis not present

## 2018-05-19 DIAGNOSIS — M109 Gout, unspecified: Secondary | ICD-10-CM | POA: Diagnosis not present

## 2018-05-19 DIAGNOSIS — S92331D Displaced fracture of third metatarsal bone, right foot, subsequent encounter for fracture with routine healing: Secondary | ICD-10-CM | POA: Diagnosis not present

## 2018-05-19 DIAGNOSIS — E1140 Type 2 diabetes mellitus with diabetic neuropathy, unspecified: Secondary | ICD-10-CM | POA: Diagnosis not present

## 2018-05-19 DIAGNOSIS — I130 Hypertensive heart and chronic kidney disease with heart failure and stage 1 through stage 4 chronic kidney disease, or unspecified chronic kidney disease: Secondary | ICD-10-CM | POA: Diagnosis not present

## 2018-05-19 DIAGNOSIS — Z7982 Long term (current) use of aspirin: Secondary | ICD-10-CM | POA: Diagnosis not present

## 2018-05-19 DIAGNOSIS — N183 Chronic kidney disease, stage 3 (moderate): Secondary | ICD-10-CM | POA: Diagnosis not present

## 2018-05-19 DIAGNOSIS — Z9181 History of falling: Secondary | ICD-10-CM | POA: Diagnosis not present

## 2018-05-19 DIAGNOSIS — D631 Anemia in chronic kidney disease: Secondary | ICD-10-CM | POA: Diagnosis not present

## 2018-05-19 DIAGNOSIS — G3184 Mild cognitive impairment, so stated: Secondary | ICD-10-CM | POA: Diagnosis not present

## 2018-05-19 DIAGNOSIS — I2510 Atherosclerotic heart disease of native coronary artery without angina pectoris: Secondary | ICD-10-CM | POA: Diagnosis not present

## 2018-05-19 DIAGNOSIS — M797 Fibromyalgia: Secondary | ICD-10-CM | POA: Diagnosis not present

## 2018-05-19 DIAGNOSIS — Z794 Long term (current) use of insulin: Secondary | ICD-10-CM | POA: Diagnosis not present

## 2018-05-19 DIAGNOSIS — J449 Chronic obstructive pulmonary disease, unspecified: Secondary | ICD-10-CM | POA: Diagnosis not present

## 2018-05-22 ENCOUNTER — Telehealth: Payer: Self-pay | Admitting: Neurology

## 2018-05-22 ENCOUNTER — Other Ambulatory Visit: Payer: Self-pay | Admitting: Family Medicine

## 2018-05-22 DIAGNOSIS — M329 Systemic lupus erythematosus, unspecified: Secondary | ICD-10-CM | POA: Diagnosis not present

## 2018-05-22 DIAGNOSIS — S92331D Displaced fracture of third metatarsal bone, right foot, subsequent encounter for fracture with routine healing: Secondary | ICD-10-CM | POA: Diagnosis not present

## 2018-05-22 DIAGNOSIS — G3184 Mild cognitive impairment, so stated: Secondary | ICD-10-CM | POA: Diagnosis not present

## 2018-05-22 DIAGNOSIS — Z7982 Long term (current) use of aspirin: Secondary | ICD-10-CM | POA: Diagnosis not present

## 2018-05-22 DIAGNOSIS — M109 Gout, unspecified: Secondary | ICD-10-CM | POA: Diagnosis not present

## 2018-05-22 DIAGNOSIS — I130 Hypertensive heart and chronic kidney disease with heart failure and stage 1 through stage 4 chronic kidney disease, or unspecified chronic kidney disease: Secondary | ICD-10-CM | POA: Diagnosis not present

## 2018-05-22 DIAGNOSIS — I2510 Atherosclerotic heart disease of native coronary artery without angina pectoris: Secondary | ICD-10-CM | POA: Diagnosis not present

## 2018-05-22 DIAGNOSIS — G40209 Localization-related (focal) (partial) symptomatic epilepsy and epileptic syndromes with complex partial seizures, not intractable, without status epilepticus: Secondary | ICD-10-CM

## 2018-05-22 DIAGNOSIS — I5042 Chronic combined systolic (congestive) and diastolic (congestive) heart failure: Secondary | ICD-10-CM | POA: Diagnosis not present

## 2018-05-22 DIAGNOSIS — N183 Chronic kidney disease, stage 3 (moderate): Secondary | ICD-10-CM | POA: Diagnosis not present

## 2018-05-22 DIAGNOSIS — D631 Anemia in chronic kidney disease: Secondary | ICD-10-CM | POA: Diagnosis not present

## 2018-05-22 DIAGNOSIS — M170 Bilateral primary osteoarthritis of knee: Secondary | ICD-10-CM | POA: Diagnosis not present

## 2018-05-22 DIAGNOSIS — E1140 Type 2 diabetes mellitus with diabetic neuropathy, unspecified: Secondary | ICD-10-CM | POA: Diagnosis not present

## 2018-05-22 DIAGNOSIS — J449 Chronic obstructive pulmonary disease, unspecified: Secondary | ICD-10-CM | POA: Diagnosis not present

## 2018-05-22 DIAGNOSIS — Z9181 History of falling: Secondary | ICD-10-CM | POA: Diagnosis not present

## 2018-05-22 DIAGNOSIS — Z794 Long term (current) use of insulin: Secondary | ICD-10-CM | POA: Diagnosis not present

## 2018-05-22 DIAGNOSIS — M797 Fibromyalgia: Secondary | ICD-10-CM | POA: Diagnosis not present

## 2018-05-22 MED ORDER — LEVETIRACETAM 500 MG PO TABS: 500.0000 mg | ORAL_TABLET | Freq: Two times a day (BID) | ORAL | 3 refills | Status: DC

## 2018-05-22 NOTE — Telephone Encounter (Signed)
Patient is taking 2 tabs a day instead of 1 tab a day. Her cardiologist suggested her to take 2

## 2018-05-22 NOTE — Telephone Encounter (Signed)
Patient is needing a refill on her Keppra medication until her appt in July. Please send this to the [facility] on [address]. She also has some questions concerning the DMV about driving license. Please call her back at [phone]. Thanks!

## 2018-05-22 NOTE — Telephone Encounter (Signed)
Spoke with pt.  She states that Dr. Delice Lesch has failed to send [facility] paperwork to [facility] and therefor pt cannot obtain her license.  I advised that per 11/10/17 phone note DMV was to send paperwork to our office, but we have not received it.  Pt went on to say that DMV never told her that they were sending paperwork, but that our office would supply the forms.  I advised that that is not the case.  Pt states that she is getting the run around from both our office as well as the DMV.  (Per Dr. Amparo Bristol LOV note - Dr. Delice Lesch wanted to speak with pt's daughter in regards to driving.)  Rx for Keppra #180 with 3 refills Sig = take 1 tab BID  Sent to [facility] on [address].

## 2018-05-23 ENCOUNTER — Telehealth: Payer: Self-pay | Admitting: Family Medicine

## 2018-05-23 ENCOUNTER — Telehealth: Payer: Self-pay

## 2018-05-23 DIAGNOSIS — G3184 Mild cognitive impairment, so stated: Secondary | ICD-10-CM | POA: Diagnosis not present

## 2018-05-23 DIAGNOSIS — M170 Bilateral primary osteoarthritis of knee: Secondary | ICD-10-CM | POA: Diagnosis not present

## 2018-05-23 DIAGNOSIS — S92331D Displaced fracture of third metatarsal bone, right foot, subsequent encounter for fracture with routine healing: Secondary | ICD-10-CM | POA: Diagnosis not present

## 2018-05-23 DIAGNOSIS — M109 Gout, unspecified: Secondary | ICD-10-CM | POA: Diagnosis not present

## 2018-05-23 DIAGNOSIS — I130 Hypertensive heart and chronic kidney disease with heart failure and stage 1 through stage 4 chronic kidney disease, or unspecified chronic kidney disease: Secondary | ICD-10-CM | POA: Diagnosis not present

## 2018-05-23 DIAGNOSIS — Z7982 Long term (current) use of aspirin: Secondary | ICD-10-CM | POA: Diagnosis not present

## 2018-05-23 DIAGNOSIS — Z9181 History of falling: Secondary | ICD-10-CM | POA: Diagnosis not present

## 2018-05-23 DIAGNOSIS — J449 Chronic obstructive pulmonary disease, unspecified: Secondary | ICD-10-CM | POA: Diagnosis not present

## 2018-05-23 DIAGNOSIS — N183 Chronic kidney disease, stage 3 (moderate): Secondary | ICD-10-CM | POA: Diagnosis not present

## 2018-05-23 DIAGNOSIS — E1140 Type 2 diabetes mellitus with diabetic neuropathy, unspecified: Secondary | ICD-10-CM | POA: Diagnosis not present

## 2018-05-23 DIAGNOSIS — D631 Anemia in chronic kidney disease: Secondary | ICD-10-CM | POA: Diagnosis not present

## 2018-05-23 DIAGNOSIS — I5042 Chronic combined systolic (congestive) and diastolic (congestive) heart failure: Secondary | ICD-10-CM | POA: Diagnosis not present

## 2018-05-23 DIAGNOSIS — I2510 Atherosclerotic heart disease of native coronary artery without angina pectoris: Secondary | ICD-10-CM | POA: Diagnosis not present

## 2018-05-23 DIAGNOSIS — M797 Fibromyalgia: Secondary | ICD-10-CM | POA: Diagnosis not present

## 2018-05-23 DIAGNOSIS — Z794 Long term (current) use of insulin: Secondary | ICD-10-CM | POA: Diagnosis not present

## 2018-05-23 DIAGNOSIS — M329 Systemic lupus erythematosus, unspecified: Secondary | ICD-10-CM | POA: Diagnosis not present

## 2018-05-23 NOTE — Telephone Encounter (Signed)
Please also see TE with Dr Delice Lesch dated 05/22/18. They are going to perform driving assessment and complete forms. Pt seems to be having some memory issues.

## 2018-05-23 NOTE — Telephone Encounter (Signed)
Message sent to Dr. Jordan for review. 

## 2018-05-23 NOTE — Telephone Encounter (Unsigned)
Copied from [place] [id number]. Topic: Quick Communication - Rx Refill/Question >> May 23, 2018  1:12 PM Yvette Rack wrote: Medication: FLOVENT HFA 220 MCG/ACT inhaler  Has the patient contacted their pharmacy? no  Preferred Pharmacy (with phone number or street name): [facility] [phone] - [place], [place] - [address] [phone] (Phone)  [phone] (Fax)   Agent: Please be advised that RX refills may take up to 3 business days. We ask that you follow-up with your pharmacy.

## 2018-05-23 NOTE — Telephone Encounter (Signed)
Spoke to daughter [name] on her [place]. She has not seen any further seizures. She is however concerned about patient's memory. She needs reminder calls to take her medication. Family fills her pillbox and around 2-3 times a month they would see that she has not taken meds from day prior. They have to call her to remind her to eat at times. She does pretty well paying bills with no missed bills, daughter checks behind her. When she was previously driving, she was not getting lost. Discussed with daughter recommendation for driving evaluation, which she strongly agrees with. Will fill out DMV paperwork as we discussed above.   Meagen, pls let her know I will fill out paperwork but will need driving evaluation through the [facility], which her family can bring her for. Thanks

## 2018-05-23 NOTE — Telephone Encounter (Signed)
Copied from [place] [id number]. Topic: General - Other >> May 23, 2018  1:15 PM Yvette Rack wrote: Reason for CRM: Pt stated she needs assistance with getting her license back. Pt stated she has been paying insurance on her car (over $400) but has not been able to drive it. Pt stated she does not thinks the neurologist has completed the DMV form but when she called they told her not to call back she would be contacted. Pt stated she when she spoke with DMV she was advised that an evaluation had to be completed. Pt requests call back.

## 2018-05-24 ENCOUNTER — Encounter (INDEPENDENT_AMBULATORY_CARE_PROVIDER_SITE_OTHER): Payer: Self-pay | Admitting: Orthopaedic Surgery

## 2018-05-24 ENCOUNTER — Ambulatory Visit (INDEPENDENT_AMBULATORY_CARE_PROVIDER_SITE_OTHER): Payer: Self-pay

## 2018-05-24 ENCOUNTER — Ambulatory Visit (INDEPENDENT_AMBULATORY_CARE_PROVIDER_SITE_OTHER): Payer: Medicare Other | Admitting: Orthopaedic Surgery

## 2018-05-24 DIAGNOSIS — S92331D Displaced fracture of third metatarsal bone, right foot, subsequent encounter for fracture with routine healing: Secondary | ICD-10-CM

## 2018-05-24 DIAGNOSIS — G8929 Other chronic pain: Secondary | ICD-10-CM | POA: Diagnosis not present

## 2018-05-24 DIAGNOSIS — M65312 Trigger thumb, left thumb: Secondary | ICD-10-CM | POA: Diagnosis not present

## 2018-05-24 DIAGNOSIS — M25511 Pain in right shoulder: Secondary | ICD-10-CM

## 2018-05-24 MED ORDER — METHYLPREDNISOLONE ACETATE 40 MG/ML IJ SUSP
40.0000 mg | INTRAMUSCULAR | Status: AC | PRN
  Administered 2018-05-24: 40 mg

## 2018-05-24 MED ORDER — LIDOCAINE HCL 1 % IJ SOLN
3.0000 mL | INTRAMUSCULAR | Status: AC | PRN
  Administered 2018-05-24: 3 mL

## 2018-05-24 MED ORDER — METHYLPREDNISOLONE ACETATE 40 MG/ML IJ SUSP
40.0000 mg | INTRAMUSCULAR | Status: AC | PRN
  Administered 2018-05-24: 40 mg via INTRA_ARTICULAR

## 2018-05-24 MED ORDER — LIDOCAINE HCL 1 % IJ SOLN
1.0000 mL | INTRAMUSCULAR | Status: AC | PRN
  Administered 2018-05-24: 1 mL

## 2018-05-24 NOTE — Telephone Encounter (Signed)
Call to patient to verify that she needs Flovent inhaler since it is not current on her medication list. Patient states she was given powder inhaler- but she could not use it due to it causing dryness in her mouth and throat. She prefers the [employer] and would like an Rx for that.   LOV: 05/09/18  PCP: [name]  Pharmacy: verified

## 2018-05-24 NOTE — Progress Notes (Signed)
Office Visit Note   Patient: Brittany Roberts           Date of Birth: 1940-09-10           MRN: [id number] Visit Date: 05/24/2018              Requested by: [name], [name], MD [address] [place], [place] [address] PCP: [name], [name], MD   Assessment & Plan: Visit Diagnoses:  1. Closed displaced fracture of third metatarsal bone of right foot with routine healing, subsequent encounter   2. Trigger thumb, left thumb   3. Chronic right shoulder pain     Plan: At this point she can transition to regular shoes.  We do not need to x-ray her foot again given the healing that we have seen.  If she has any issues with that she will let us know.  I did inject the A1 pulley at the left thumb and her right shoulder and subacromial space.  I counseled about how steroids can affect her blood glucose and the risk and benefits involved.  All question concerns were answered and addressed.  At this point follow-up will be as needed.  Follow-Up Instructions: Return if symptoms worsen or fail to improve.   Orders:  Orders Placed This Encounter  Procedures  . Hand/UE Inj  . Large Joint Inj  . XR Foot 2 Views Right   No orders of the defined types were placed in this encounter.     Procedures: Hand/UE Inj: L thumb A1 for trigger finger on 05/24/2018 10:13 AM Medications: 1 mL lidocaine 1 %; 40 mg methylPREDNISolone acetate 40 MG/ML  Large Joint Inj: R subacromial bursa on 05/24/2018 10:13 AM Indications: pain and diagnostic evaluation Details: 22 G 1.5 in needle  Arthrogram: No  Medications: 3 mL lidocaine 1 %; 40 mg methylPREDNISolone acetate 40 MG/ML Outcome: tolerated well, no immediate complications Procedure, treatment alternatives, risks and benefits explained, specific risks discussed. Consent was given by the patient. Immediately prior to procedure a time out was called to verify the correct patient, procedure, equipment, support staff and site/side marked as required.  Patient was prepped and draped in the usual sterile fashion.       Clinical Data: No additional findings.   Subjective: Chief Complaint  Patient presents with  . Right Foot - Follow-up  The patient is well-known to [place].  She is a morbidly obese 77 year old female with a BMI of 48.  Is been dealing with right knee issues and does come in today for evaluation treatment of trigger thumb on the left hand and wants an injection again her right shoulder.  All these have been seen before.  She is also in follow-up for having metatarsal fractures of the base of her second third metatarsals.  The boot followed by postoperative shoe.  She feels she can go to regular shoe at this point.  She does ambulate with a rolling walker.  HPI  Review of Systems She currently denies any headache, chest pain, shortness of breath, fever, chills, nausea, vomiting.  She reports good diabetic control.  Objective: Vital Signs: There were no vitals taken for this visit.  Physical Exam She is alert and oriented x3 and in no acute distress Ortho Exam Examination of her left thumb does show pain at the A1 pulley and active triggering.  Examination of her right shoulder shows weakness and pain with overhead motion with signs of impingement.  Examination of her foot on the right  side shows minimal swelling and no significant pain. Specialty Comments:  No specialty comments available.  Imaging: Xr Foot 2 Views Right  Result Date: 05/24/2018 3 views of the right foot show a healing base of the second and third metatarsal fractures with no malalignment.    PMFS History: Patient Active Problem List   Diagnosis Date Noted  . Chronic right shoulder pain 03/15/2018  . Bilateral primary osteoarthritis of knee 02/09/2018  . Pain in right ankle and joints of right foot 02/09/2018  . Acute exacerbation of congestive heart failure ([place]) 12/13/2017  . AKI (acute kidney injury) ([place]) 09/01/2017  . Gout, arthropathy  09/01/2017  . Localization-related symptomatic epilepsy and epileptic syndromes with complex partial seizures, not intractable, without status epilepticus ([place]) 04/25/2017  . Fibromyalgia 04/08/2017  . Allergic rhinitis 03/11/2017  . Cardiomyopathy ([place]) 01/22/2017  . Hyperlipidemia associated with type 2 diabetes mellitus ([place]) 01/22/2017  . Bipolar disorder ([place]) 01/22/2017  . Seizure ([place])   . Thoracic aortic atherosclerosis ([place]) 01/14/2017  . Coronary artery calcification seen on CAT scan 01/14/2017  . Acute delirium 01/14/2017  . Possible Tonic-clonic seizure disorder ([place]) 01/14/2017  . CVA (cerebral vascular accident) ([place]) 01/14/2017  . Altered mental status 01/13/2017  . Mastalgia 04/20/2016  . Bilateral leg edema 02/10/2016  . Hearing difficulty 10/30/2015  . Midline low back pain with right-sided sciatica 09/25/2015  . Angina pectoris ([place]) 06/30/2015  . Dysarthria 02/14/2015  . Schizophrenia, unspecified type ([place]) 10/25/2014  . Type 2 diabetes mellitus with complication ([place])   . Cephalalgia   . Hx of systemic lupus erythematosus (SLE) ([place])   . Thyroid mass 04/26/2014  . Constipation 12/10/2013  . Benign neoplasm of colon 12/10/2013  . Controlled type 2 diabetes with neuropathy ([place]) 11/27/2013  . Cystic kidney disease 11/09/2013  . Insulin dependent type 2 diabetes mellitus ([place]) 10/29/2013  . Hoarseness or changing voice 09/27/2013  . Dyskinesia, tardive 07/11/2013  . TMJ syndrome 02/16/2013  . Angina pectoris associated with type 2 diabetes mellitus ([place]) 08/08/2012  . CAD in native artery 08/08/2012  . Asthma, chronic, PRESUMED wiht MULTIFACTORIAL DYSPNEA 05/30/2012  . Insomnia 09/22/2011  . Mobility poor 02/02/2011  . Chronic kidney disease, stage III (moderate) ([place]) 05/13/2010  . Chronic combined systolic and diastolic CHF, NYHA class 2 ([place]) 03/31/2010  . COPD (chronic obstructive pulmonary disease) ([place]) 03/26/2010  . Morbid obesity ([place]) 02/27/2010    . Unstable gait 02/27/2010  . DM (diabetes mellitus), type 2 with neurological complications ([place]) 16/04/9603  . Osteoarthritis 04/25/2009  . Anemia 03/11/2007  . COGNITIVE IMPAIRMENT, MILD, SO STATED 03/11/2007  . RESTLESS LEG SYNDROME 03/11/2007  . Systemic lupus ([place]) 03/11/2007  . OSTEOARTHROSIS, GENERALIZED, MULTIPLE SITES 03/11/2007  . Hypothyroidism 03/07/2007  . DYSLIPIDEMIA 03/07/2007  . Depression 03/07/2007  . Essential hypertension 03/07/2007  . Coronary atherosclerosis 03/07/2007  . GERD 03/07/2007  . SLEEP APNEA 03/07/2007   Past Medical History:  Diagnosis Date  . Acute delirium 01/14/2017  . Anxiety   . Arthritis    "all over"  . Asthma   . Bipolar disorder ([place])   . Complication of anesthesia    "w/right foot OR they gave me too much and they couldn't get me woke"  . Congestive heart failure, unspecified   . Coronary artery calcification seen on CAT scan 01/14/2017  . Coronary artery disease    "I've got 1 stent" (06/30/2015)  . Depression   . Discoid lupus   . Fibromyalgia    "I've been told  I have this; can't take Lyrica cause I'm allergic to it" (06/30/2015)  . GERD (gastroesophageal reflux disease)   . History of gout   . History of hiatal hernia    "real bad" (06/30/2015)  . Hypertension   . Hypothyroidism   . On home oxygen therapy    "2L q hs" (06/30/2015)  . OSA (obstructive sleep apnea)    "just use my oxygen; no mask" (06/30/2015)  . Other and unspecified hyperlipidemia   . Penetrating foot wound    left nonhealing foot wound on the dorsal surface  . Peripheral neuropathy   . Pneumonia "many of times"  . Refusal of blood transfusions as patient is Jehovah's Witness   . Renal failure, unspecified    "kidneys not working 100%" (06/30/2015)  . Sickle cell trait ([place])   . Systemic lupus ([place])   . Thoracic aortic atherosclerosis ([place]) 01/14/2017  . Type II diabetes mellitus ([place])   . Unspecified vitamin D deficiency     Family History   Problem Relation Age of Onset  . Heart disease Mother   . Diabetes Mother   . Clotting disorder Mother   . Pneumonia Father   . Rheum arthritis Father   . Diabetes Sister   . Diabetes Sister   . Asthma Brother   . Cancer Brother   . Kidney disease Brother   . Lupus Son   . Heart disease Son   . Heart disease Daughter   . Colon cancer Neg Hx     Past Surgical History:  Procedure Laterality Date  . ANKLE ARTHROSCOPY WITH OPEN REDUCTION INTERNAL FIXATION (ORIF) Right 03/2011  . CARDIAC CATHETERIZATION N/A 07/01/2015   Procedure: Left Heart Cath and Coronary Angiography;  Surgeon: Adrian Prows, MD;  Location: [facility];  Service: Cardiovascular;  Laterality: N/A;  . CARDIAC CATHETERIZATION N/A 07/01/2015   Procedure: Intravascular Pressure Wire/FFR Study;  Surgeon: Adrian Prows, MD;  Location: [facility];  Service: Cardiovascular;  Laterality: N/A;  . CARPAL TUNNEL RELEASE Bilateral   . CATARACT EXTRACTION W/ INTRAOCULAR LENS  IMPLANT, BILATERAL Bilateral   . CHOLECYSTECTOMY OPEN    . COLONOSCOPY N/A 12/10/2013   Procedure: COLONOSCOPY;  Surgeon: Inda Castle, MD;  Location: [facility];  Service: Endoscopy;  Laterality: N/A;  . FRACTURE SURGERY    . INCISION AND DRAINAGE ABSCESS Left    foot "under my little toe"  . KNEE LIGAMENT RECONSTRUCTION Right   . LEFT HEART CATHETERIZATION WITH CORONARY ANGIOGRAM N/A 08/08/2012   Procedure: LEFT HEART CATHETERIZATION WITH CORONARY ANGIOGRAM;  Surgeon: [name], MD;  Location[facility];  Service: Cardiovascular;  Laterality: N/A;  . NASAL SINUS SURGERY    . PERCUTANEOUS CORONARY STENT INTERVENTION (PCI-S) N/A 08/21/2012   Procedure: PERCUTANEOUS CORONARY STENT INTERVENTION (PCI-S);  Surgeon: [name], MD;  Location: [facility] LAB;  Service: Cardiovascular;  Laterality: N/A;  . TUBAL LIGATION     Social History   Occupational History  . Occupation: Retired- Engineer, production    Employer: RETIRED   Tobacco Use  . Smoking status: Former Smoker    Packs/day: 3.00    Years: 40.00    Pack years: 120.00    Types: Cigarettes    Start date: 07/05/1960    Last attempt to quit: 07/05/1998    Years since quitting: 19.8  . Smokeless tobacco: Never Used  Substance and Sexual Activity  . Alcohol use: No    Alcohol/week: 0.0 standard drinks  Comment: 06/30/2015 "used to drink a little alcohol on the weekends; not q weekend; nothing in years"  . Drug use: No  . Sexual activity: Never

## 2018-05-24 NOTE — Telephone Encounter (Signed)
Message sent to Dr. Jordan for review and approval. 

## 2018-05-24 NOTE — Telephone Encounter (Signed)
I prefer this issue to be discussed with her neurologist, Dr. Delice Lesch. Thanks, BJ

## 2018-05-25 DIAGNOSIS — Z794 Long term (current) use of insulin: Secondary | ICD-10-CM | POA: Diagnosis not present

## 2018-05-25 DIAGNOSIS — N183 Chronic kidney disease, stage 3 (moderate): Secondary | ICD-10-CM | POA: Diagnosis not present

## 2018-05-25 DIAGNOSIS — G3184 Mild cognitive impairment, so stated: Secondary | ICD-10-CM | POA: Diagnosis not present

## 2018-05-25 DIAGNOSIS — I2510 Atherosclerotic heart disease of native coronary artery without angina pectoris: Secondary | ICD-10-CM | POA: Diagnosis not present

## 2018-05-25 DIAGNOSIS — M170 Bilateral primary osteoarthritis of knee: Secondary | ICD-10-CM | POA: Diagnosis not present

## 2018-05-25 DIAGNOSIS — Z7982 Long term (current) use of aspirin: Secondary | ICD-10-CM | POA: Diagnosis not present

## 2018-05-25 DIAGNOSIS — I130 Hypertensive heart and chronic kidney disease with heart failure and stage 1 through stage 4 chronic kidney disease, or unspecified chronic kidney disease: Secondary | ICD-10-CM | POA: Diagnosis not present

## 2018-05-25 DIAGNOSIS — Z9181 History of falling: Secondary | ICD-10-CM | POA: Diagnosis not present

## 2018-05-25 DIAGNOSIS — J449 Chronic obstructive pulmonary disease, unspecified: Secondary | ICD-10-CM | POA: Diagnosis not present

## 2018-05-25 DIAGNOSIS — I5042 Chronic combined systolic (congestive) and diastolic (congestive) heart failure: Secondary | ICD-10-CM | POA: Diagnosis not present

## 2018-05-25 DIAGNOSIS — E1140 Type 2 diabetes mellitus with diabetic neuropathy, unspecified: Secondary | ICD-10-CM | POA: Diagnosis not present

## 2018-05-25 DIAGNOSIS — M109 Gout, unspecified: Secondary | ICD-10-CM | POA: Diagnosis not present

## 2018-05-25 DIAGNOSIS — M797 Fibromyalgia: Secondary | ICD-10-CM | POA: Diagnosis not present

## 2018-05-25 DIAGNOSIS — M329 Systemic lupus erythematosus, unspecified: Secondary | ICD-10-CM | POA: Diagnosis not present

## 2018-05-25 DIAGNOSIS — S92331D Displaced fracture of third metatarsal bone, right foot, subsequent encounter for fracture with routine healing: Secondary | ICD-10-CM | POA: Diagnosis not present

## 2018-05-25 DIAGNOSIS — D631 Anemia in chronic kidney disease: Secondary | ICD-10-CM | POA: Diagnosis not present

## 2018-05-25 NOTE — Telephone Encounter (Signed)
Patient informed that Dr. Amparo Bristol office will be doing and assessment and completing her forms. Patient verified understanding. Patient stated that Dr. Amparo Bristol office called her on 05/24/18 and her nurse confirmed that they would be taking care of the paperwork. Patient wants to know if she can be referred to another neurologists due to the fact that her current one isn't following up as she should. She stated that her next appointment isn't scheduled until July 2020. Please advise.

## 2018-05-26 ENCOUNTER — Other Ambulatory Visit: Payer: Self-pay | Admitting: *Deleted

## 2018-05-26 ENCOUNTER — Other Ambulatory Visit: Payer: Self-pay | Admitting: Family Medicine

## 2018-05-26 ENCOUNTER — Other Ambulatory Visit: Payer: Self-pay | Admitting: Licensed Clinical Social Worker

## 2018-05-26 DIAGNOSIS — N183 Chronic kidney disease, stage 3 (moderate): Secondary | ICD-10-CM | POA: Diagnosis not present

## 2018-05-26 DIAGNOSIS — Z7982 Long term (current) use of aspirin: Secondary | ICD-10-CM | POA: Diagnosis not present

## 2018-05-26 DIAGNOSIS — Z794 Long term (current) use of insulin: Secondary | ICD-10-CM | POA: Diagnosis not present

## 2018-05-26 DIAGNOSIS — M170 Bilateral primary osteoarthritis of knee: Secondary | ICD-10-CM | POA: Diagnosis not present

## 2018-05-26 DIAGNOSIS — M797 Fibromyalgia: Secondary | ICD-10-CM | POA: Diagnosis not present

## 2018-05-26 DIAGNOSIS — R569 Unspecified convulsions: Secondary | ICD-10-CM

## 2018-05-26 DIAGNOSIS — I2510 Atherosclerotic heart disease of native coronary artery without angina pectoris: Secondary | ICD-10-CM | POA: Diagnosis not present

## 2018-05-26 DIAGNOSIS — S92331D Displaced fracture of third metatarsal bone, right foot, subsequent encounter for fracture with routine healing: Secondary | ICD-10-CM | POA: Diagnosis not present

## 2018-05-26 DIAGNOSIS — M109 Gout, unspecified: Secondary | ICD-10-CM | POA: Diagnosis not present

## 2018-05-26 DIAGNOSIS — E1140 Type 2 diabetes mellitus with diabetic neuropathy, unspecified: Secondary | ICD-10-CM | POA: Diagnosis not present

## 2018-05-26 DIAGNOSIS — I130 Hypertensive heart and chronic kidney disease with heart failure and stage 1 through stage 4 chronic kidney disease, or unspecified chronic kidney disease: Secondary | ICD-10-CM | POA: Diagnosis not present

## 2018-05-26 DIAGNOSIS — R410 Disorientation, unspecified: Secondary | ICD-10-CM

## 2018-05-26 DIAGNOSIS — D631 Anemia in chronic kidney disease: Secondary | ICD-10-CM | POA: Diagnosis not present

## 2018-05-26 DIAGNOSIS — J449 Chronic obstructive pulmonary disease, unspecified: Secondary | ICD-10-CM | POA: Diagnosis not present

## 2018-05-26 DIAGNOSIS — M329 Systemic lupus erythematosus, unspecified: Secondary | ICD-10-CM | POA: Diagnosis not present

## 2018-05-26 DIAGNOSIS — Z9181 History of falling: Secondary | ICD-10-CM | POA: Diagnosis not present

## 2018-05-26 DIAGNOSIS — G3184 Mild cognitive impairment, so stated: Secondary | ICD-10-CM | POA: Diagnosis not present

## 2018-05-26 DIAGNOSIS — I5042 Chronic combined systolic (congestive) and diastolic (congestive) heart failure: Secondary | ICD-10-CM | POA: Diagnosis not present

## 2018-05-26 MED ORDER — FLUTICASONE PROPIONATE HFA 110 MCG/ACT IN AERO: 2.0000 | INHALATION_SPRAY | Freq: Two times a day (BID) | RESPIRATORY_TRACT | 4 refills | Status: DC

## 2018-05-26 NOTE — Telephone Encounter (Signed)
Referral for new neurologist placed as requested. Patient informed.

## 2018-05-26 NOTE — Telephone Encounter (Signed)
Prescription for Flovent inhaler was sent to her pharmacy to use 2 puff twice daily. Be sure to rinse mouth after use, it can increase the risk of thrush.  Thanks, BJ

## 2018-05-26 NOTE — Telephone Encounter (Signed)
It is okay to refer to a different neurologist as requested by patient. Recommend not to drive until she is evaluated by neurologist. Thanks, BJ

## 2018-05-26 NOTE — Patient Outreach (Signed)
Assessment:  CSW spoke earlier this month with client regarding client status and needs. Client had discharged from [facility] in [place], [place] and had returned to her home with supports in place. Client had reported to [facility] that she was receiving in home care needed with [facility] agency. Client had said she had no further [facility] nursing needs. Client also has no further [facility] CSW needs at this time. She has support from her daughter, from home health agency and from friends. She does not have further CSW needs at present. Thus, CSW is discharging KRYSTINE PABST form [facility] CSW services on 05/26/18.  Norva Riffle.Oprah Camarena MSW, LCSW Licensed Clinical Social Worker [facility] [phone]

## 2018-05-27 DIAGNOSIS — I5042 Chronic combined systolic (congestive) and diastolic (congestive) heart failure: Secondary | ICD-10-CM | POA: Diagnosis not present

## 2018-05-27 DIAGNOSIS — G3184 Mild cognitive impairment, so stated: Secondary | ICD-10-CM | POA: Diagnosis not present

## 2018-05-27 DIAGNOSIS — M329 Systemic lupus erythematosus, unspecified: Secondary | ICD-10-CM | POA: Diagnosis not present

## 2018-05-27 DIAGNOSIS — Z794 Long term (current) use of insulin: Secondary | ICD-10-CM | POA: Diagnosis not present

## 2018-05-27 DIAGNOSIS — Z9181 History of falling: Secondary | ICD-10-CM | POA: Diagnosis not present

## 2018-05-27 DIAGNOSIS — I130 Hypertensive heart and chronic kidney disease with heart failure and stage 1 through stage 4 chronic kidney disease, or unspecified chronic kidney disease: Secondary | ICD-10-CM | POA: Diagnosis not present

## 2018-05-27 DIAGNOSIS — M109 Gout, unspecified: Secondary | ICD-10-CM | POA: Diagnosis not present

## 2018-05-27 DIAGNOSIS — N183 Chronic kidney disease, stage 3 (moderate): Secondary | ICD-10-CM | POA: Diagnosis not present

## 2018-05-27 DIAGNOSIS — Z7982 Long term (current) use of aspirin: Secondary | ICD-10-CM | POA: Diagnosis not present

## 2018-05-27 DIAGNOSIS — D631 Anemia in chronic kidney disease: Secondary | ICD-10-CM | POA: Diagnosis not present

## 2018-05-27 DIAGNOSIS — S92331D Displaced fracture of third metatarsal bone, right foot, subsequent encounter for fracture with routine healing: Secondary | ICD-10-CM | POA: Diagnosis not present

## 2018-05-27 DIAGNOSIS — E1140 Type 2 diabetes mellitus with diabetic neuropathy, unspecified: Secondary | ICD-10-CM | POA: Diagnosis not present

## 2018-05-27 DIAGNOSIS — M797 Fibromyalgia: Secondary | ICD-10-CM | POA: Diagnosis not present

## 2018-05-27 DIAGNOSIS — J449 Chronic obstructive pulmonary disease, unspecified: Secondary | ICD-10-CM | POA: Diagnosis not present

## 2018-05-27 DIAGNOSIS — I2510 Atherosclerotic heart disease of native coronary artery without angina pectoris: Secondary | ICD-10-CM | POA: Diagnosis not present

## 2018-05-27 DIAGNOSIS — M170 Bilateral primary osteoarthritis of knee: Secondary | ICD-10-CM | POA: Diagnosis not present

## 2018-05-29 NOTE — Telephone Encounter (Signed)
Patient notified

## 2018-05-30 DIAGNOSIS — M329 Systemic lupus erythematosus, unspecified: Secondary | ICD-10-CM | POA: Diagnosis not present

## 2018-05-30 DIAGNOSIS — M797 Fibromyalgia: Secondary | ICD-10-CM | POA: Diagnosis not present

## 2018-05-30 DIAGNOSIS — I5042 Chronic combined systolic (congestive) and diastolic (congestive) heart failure: Secondary | ICD-10-CM | POA: Diagnosis not present

## 2018-05-30 DIAGNOSIS — I2510 Atherosclerotic heart disease of native coronary artery without angina pectoris: Secondary | ICD-10-CM | POA: Diagnosis not present

## 2018-05-30 DIAGNOSIS — M170 Bilateral primary osteoarthritis of knee: Secondary | ICD-10-CM | POA: Diagnosis not present

## 2018-05-30 DIAGNOSIS — I130 Hypertensive heart and chronic kidney disease with heart failure and stage 1 through stage 4 chronic kidney disease, or unspecified chronic kidney disease: Secondary | ICD-10-CM | POA: Diagnosis not present

## 2018-05-30 DIAGNOSIS — M109 Gout, unspecified: Secondary | ICD-10-CM | POA: Diagnosis not present

## 2018-05-30 DIAGNOSIS — E1140 Type 2 diabetes mellitus with diabetic neuropathy, unspecified: Secondary | ICD-10-CM | POA: Diagnosis not present

## 2018-05-30 DIAGNOSIS — Z9181 History of falling: Secondary | ICD-10-CM | POA: Diagnosis not present

## 2018-05-30 DIAGNOSIS — N183 Chronic kidney disease, stage 3 (moderate): Secondary | ICD-10-CM | POA: Diagnosis not present

## 2018-05-30 DIAGNOSIS — Z7982 Long term (current) use of aspirin: Secondary | ICD-10-CM | POA: Diagnosis not present

## 2018-05-30 DIAGNOSIS — J449 Chronic obstructive pulmonary disease, unspecified: Secondary | ICD-10-CM | POA: Diagnosis not present

## 2018-05-30 DIAGNOSIS — G3184 Mild cognitive impairment, so stated: Secondary | ICD-10-CM | POA: Diagnosis not present

## 2018-05-30 DIAGNOSIS — Z794 Long term (current) use of insulin: Secondary | ICD-10-CM | POA: Diagnosis not present

## 2018-05-30 DIAGNOSIS — S92331D Displaced fracture of third metatarsal bone, right foot, subsequent encounter for fracture with routine healing: Secondary | ICD-10-CM | POA: Diagnosis not present

## 2018-05-30 DIAGNOSIS — D631 Anemia in chronic kidney disease: Secondary | ICD-10-CM | POA: Diagnosis not present

## 2018-05-31 ENCOUNTER — Telehealth: Payer: Self-pay | Admitting: Family Medicine

## 2018-05-31 ENCOUNTER — Ambulatory Visit: Payer: Self-pay | Admitting: Licensed Clinical Social Worker

## 2018-05-31 NOTE — Telephone Encounter (Signed)
Copied from [place] [id number]. Topic: Quick Communication - Home Health Verbal Orders >> May 31, 2018  3:38 PM Cecelia Byars, [place] wrote: Caller/Agency: Lelon Frohlich [facility]  Callback Number:  [phone]   ok to leave message  Requesting /Skilled Nursing/ Frequency:  Patient wants to seen 1 time this week

## 2018-05-31 NOTE — Telephone Encounter (Signed)
Verbal order for patient to be seen one time this week given to Paloma from [facility].

## 2018-06-02 DIAGNOSIS — G3184 Mild cognitive impairment, so stated: Secondary | ICD-10-CM | POA: Diagnosis not present

## 2018-06-02 DIAGNOSIS — M109 Gout, unspecified: Secondary | ICD-10-CM | POA: Diagnosis not present

## 2018-06-02 DIAGNOSIS — D631 Anemia in chronic kidney disease: Secondary | ICD-10-CM | POA: Diagnosis not present

## 2018-06-02 DIAGNOSIS — E1140 Type 2 diabetes mellitus with diabetic neuropathy, unspecified: Secondary | ICD-10-CM | POA: Diagnosis not present

## 2018-06-02 DIAGNOSIS — N183 Chronic kidney disease, stage 3 (moderate): Secondary | ICD-10-CM | POA: Diagnosis not present

## 2018-06-02 DIAGNOSIS — I5042 Chronic combined systolic (congestive) and diastolic (congestive) heart failure: Secondary | ICD-10-CM | POA: Diagnosis not present

## 2018-06-02 DIAGNOSIS — M329 Systemic lupus erythematosus, unspecified: Secondary | ICD-10-CM | POA: Diagnosis not present

## 2018-06-02 DIAGNOSIS — M170 Bilateral primary osteoarthritis of knee: Secondary | ICD-10-CM | POA: Diagnosis not present

## 2018-06-02 DIAGNOSIS — I130 Hypertensive heart and chronic kidney disease with heart failure and stage 1 through stage 4 chronic kidney disease, or unspecified chronic kidney disease: Secondary | ICD-10-CM | POA: Diagnosis not present

## 2018-06-02 DIAGNOSIS — Z794 Long term (current) use of insulin: Secondary | ICD-10-CM | POA: Diagnosis not present

## 2018-06-02 DIAGNOSIS — Z9181 History of falling: Secondary | ICD-10-CM | POA: Diagnosis not present

## 2018-06-02 DIAGNOSIS — Z7982 Long term (current) use of aspirin: Secondary | ICD-10-CM | POA: Diagnosis not present

## 2018-06-02 DIAGNOSIS — J449 Chronic obstructive pulmonary disease, unspecified: Secondary | ICD-10-CM | POA: Diagnosis not present

## 2018-06-02 DIAGNOSIS — I2510 Atherosclerotic heart disease of native coronary artery without angina pectoris: Secondary | ICD-10-CM | POA: Diagnosis not present

## 2018-06-02 DIAGNOSIS — M797 Fibromyalgia: Secondary | ICD-10-CM | POA: Diagnosis not present

## 2018-06-02 DIAGNOSIS — S92331D Displaced fracture of third metatarsal bone, right foot, subsequent encounter for fracture with routine healing: Secondary | ICD-10-CM | POA: Diagnosis not present

## 2018-06-05 ENCOUNTER — Other Ambulatory Visit: Payer: Self-pay | Admitting: Family Medicine

## 2018-06-06 DIAGNOSIS — I5042 Chronic combined systolic (congestive) and diastolic (congestive) heart failure: Secondary | ICD-10-CM | POA: Diagnosis not present

## 2018-06-06 DIAGNOSIS — I130 Hypertensive heart and chronic kidney disease with heart failure and stage 1 through stage 4 chronic kidney disease, or unspecified chronic kidney disease: Secondary | ICD-10-CM | POA: Diagnosis not present

## 2018-06-06 DIAGNOSIS — N183 Chronic kidney disease, stage 3 (moderate): Secondary | ICD-10-CM | POA: Diagnosis not present

## 2018-06-06 DIAGNOSIS — M797 Fibromyalgia: Secondary | ICD-10-CM | POA: Diagnosis not present

## 2018-06-06 DIAGNOSIS — D631 Anemia in chronic kidney disease: Secondary | ICD-10-CM | POA: Diagnosis not present

## 2018-06-06 DIAGNOSIS — S92331D Displaced fracture of third metatarsal bone, right foot, subsequent encounter for fracture with routine healing: Secondary | ICD-10-CM | POA: Diagnosis not present

## 2018-06-06 DIAGNOSIS — J449 Chronic obstructive pulmonary disease, unspecified: Secondary | ICD-10-CM | POA: Diagnosis not present

## 2018-06-06 DIAGNOSIS — Z9181 History of falling: Secondary | ICD-10-CM | POA: Diagnosis not present

## 2018-06-06 DIAGNOSIS — M329 Systemic lupus erythematosus, unspecified: Secondary | ICD-10-CM | POA: Diagnosis not present

## 2018-06-06 DIAGNOSIS — M170 Bilateral primary osteoarthritis of knee: Secondary | ICD-10-CM | POA: Diagnosis not present

## 2018-06-06 DIAGNOSIS — E1140 Type 2 diabetes mellitus with diabetic neuropathy, unspecified: Secondary | ICD-10-CM | POA: Diagnosis not present

## 2018-06-06 DIAGNOSIS — M109 Gout, unspecified: Secondary | ICD-10-CM | POA: Diagnosis not present

## 2018-06-06 DIAGNOSIS — Z794 Long term (current) use of insulin: Secondary | ICD-10-CM | POA: Diagnosis not present

## 2018-06-06 DIAGNOSIS — I2510 Atherosclerotic heart disease of native coronary artery without angina pectoris: Secondary | ICD-10-CM | POA: Diagnosis not present

## 2018-06-06 DIAGNOSIS — Z7982 Long term (current) use of aspirin: Secondary | ICD-10-CM | POA: Diagnosis not present

## 2018-06-06 DIAGNOSIS — G3184 Mild cognitive impairment, so stated: Secondary | ICD-10-CM | POA: Diagnosis not present

## 2018-06-07 ENCOUNTER — Telehealth: Payer: Self-pay | Admitting: Neurology

## 2018-06-07 ENCOUNTER — Telehealth: Payer: Self-pay | Admitting: Family Medicine

## 2018-06-07 NOTE — Telephone Encounter (Signed)
Verbal orders given to extend PT gave to [facility] as requested.

## 2018-06-07 NOTE — Telephone Encounter (Signed)
Daughter called regarding Prescription and [facility] not giving it to her without seeing the Physician first. I explained that Meagen had refilled sent into [facility] for Luana with 3 Refills on 05/22/18. Daughter said she will call [facility]. Thank you

## 2018-06-07 NOTE — Telephone Encounter (Signed)
Copied from [place] [id number]. Topic: Quick Communication - See Telephone Encounter >> Jun 07, 2018  2:40 PM Burchel, Abbi R wrote: CRM for notification. See Telephone encounter for: 06/07/18.  Monique ([place]: [phone])    Requesting v/o to extend PT 2x3wk

## 2018-06-08 NOTE — Telephone Encounter (Signed)
Noted! Thank you

## 2018-06-08 NOTE — Telephone Encounter (Signed)
Returned call to [employer] with [facility].  No answer.  Left detailed message relaying that pt's pharmacy has not received any denials for recent medications.  Left my contact information on VM in case Danielle needs to speak with me.

## 2018-06-08 NOTE — Telephone Encounter (Signed)
Received call from [place] with [facility].  She states that she received a call from pt stating that pharmacy will not allow her to pick up Rx until she is seen by Dr. Delice Lesch.  When asked which medication, Andee Poles stated Albuterol.  I advised that our office does not prescribe that. I looked at pt's med list and advised that Dr. Betty [name] prescribed pt her Albuterol, however pt was just seen by Dr. [name] on 05/09/18 so I doubted that her office denied refill.  Let Andee Poles know that I will call pt's daughter as well as pharmacy to get clarification on this.  Andee Poles can be reached at [phone] ex [phone]

## 2018-06-08 NOTE — Telephone Encounter (Signed)
Called pt's daughter, [name].  No answer.  LMOM asking for return call.

## 2018-06-08 NOTE — Telephone Encounter (Signed)
Spoke with Desiree with [employer] [facility].  Per their records, no medications have been denied recently.  Pt's potassium was kicked back, stating that provider is sending new Rx.

## 2018-06-09 ENCOUNTER — Telehealth: Payer: Self-pay

## 2018-06-09 ENCOUNTER — Other Ambulatory Visit: Payer: Self-pay | Admitting: Family Medicine

## 2018-06-09 DIAGNOSIS — I5042 Chronic combined systolic (congestive) and diastolic (congestive) heart failure: Secondary | ICD-10-CM | POA: Diagnosis not present

## 2018-06-09 DIAGNOSIS — N183 Chronic kidney disease, stage 3 (moderate): Secondary | ICD-10-CM | POA: Diagnosis not present

## 2018-06-09 DIAGNOSIS — S92331D Displaced fracture of third metatarsal bone, right foot, subsequent encounter for fracture with routine healing: Secondary | ICD-10-CM | POA: Diagnosis not present

## 2018-06-09 DIAGNOSIS — M797 Fibromyalgia: Secondary | ICD-10-CM | POA: Diagnosis not present

## 2018-06-09 DIAGNOSIS — J449 Chronic obstructive pulmonary disease, unspecified: Secondary | ICD-10-CM | POA: Diagnosis not present

## 2018-06-09 DIAGNOSIS — M329 Systemic lupus erythematosus, unspecified: Secondary | ICD-10-CM | POA: Diagnosis not present

## 2018-06-09 DIAGNOSIS — G3184 Mild cognitive impairment, so stated: Secondary | ICD-10-CM | POA: Diagnosis not present

## 2018-06-09 DIAGNOSIS — M109 Gout, unspecified: Secondary | ICD-10-CM | POA: Diagnosis not present

## 2018-06-09 DIAGNOSIS — Z7982 Long term (current) use of aspirin: Secondary | ICD-10-CM | POA: Diagnosis not present

## 2018-06-09 DIAGNOSIS — E1140 Type 2 diabetes mellitus with diabetic neuropathy, unspecified: Secondary | ICD-10-CM | POA: Diagnosis not present

## 2018-06-09 DIAGNOSIS — Z794 Long term (current) use of insulin: Secondary | ICD-10-CM | POA: Diagnosis not present

## 2018-06-09 DIAGNOSIS — I2510 Atherosclerotic heart disease of native coronary artery without angina pectoris: Secondary | ICD-10-CM | POA: Diagnosis not present

## 2018-06-09 DIAGNOSIS — Z9181 History of falling: Secondary | ICD-10-CM | POA: Diagnosis not present

## 2018-06-09 DIAGNOSIS — I130 Hypertensive heart and chronic kidney disease with heart failure and stage 1 through stage 4 chronic kidney disease, or unspecified chronic kidney disease: Secondary | ICD-10-CM | POA: Diagnosis not present

## 2018-06-09 DIAGNOSIS — M170 Bilateral primary osteoarthritis of knee: Secondary | ICD-10-CM | POA: Diagnosis not present

## 2018-06-09 DIAGNOSIS — D631 Anemia in chronic kidney disease: Secondary | ICD-10-CM | POA: Diagnosis not present

## 2018-06-09 MED ORDER — ALBUTEROL SULFATE HFA 108 (90 BASE) MCG/ACT IN AERS: INHALATION_SPRAY | RESPIRATORY_TRACT | 0 refills | Status: DC

## 2018-06-09 NOTE — Telephone Encounter (Signed)
Copied from [place] [phone]. Topic: Quick Communication - Rx Refill/Question >> Jun 09, 2018  4:13 PM Keene Breath wrote: Medication: albuterol (PROAIR HFA) 108 (90 Base) MCG/ACT inhaler  Patient called to request a refill for the above medication  Preferred Pharmacy (with phone number or street name): [facility] [place] [address] [phone] (Phone) [phone] (Fax)

## 2018-06-09 NOTE — Telephone Encounter (Signed)
FYI sent to Dr. Jordan for review. 

## 2018-06-09 NOTE — Telephone Encounter (Signed)
Copied from [place] [id number]. Topic: General - Other >> Jun 09, 2018  3:26 PM Yvette Rack wrote: Reason for CRM: Jinny Blossom with [facility] Means stated that patient had a missed visit today and patient was aware of scheduled visit. Cb# [phone]

## 2018-06-13 DIAGNOSIS — J449 Chronic obstructive pulmonary disease, unspecified: Secondary | ICD-10-CM | POA: Diagnosis not present

## 2018-06-13 DIAGNOSIS — G3184 Mild cognitive impairment, so stated: Secondary | ICD-10-CM | POA: Diagnosis not present

## 2018-06-13 DIAGNOSIS — Z7982 Long term (current) use of aspirin: Secondary | ICD-10-CM | POA: Diagnosis not present

## 2018-06-13 DIAGNOSIS — I130 Hypertensive heart and chronic kidney disease with heart failure and stage 1 through stage 4 chronic kidney disease, or unspecified chronic kidney disease: Secondary | ICD-10-CM | POA: Diagnosis not present

## 2018-06-13 DIAGNOSIS — D631 Anemia in chronic kidney disease: Secondary | ICD-10-CM | POA: Diagnosis not present

## 2018-06-13 DIAGNOSIS — Z9181 History of falling: Secondary | ICD-10-CM | POA: Diagnosis not present

## 2018-06-13 DIAGNOSIS — M329 Systemic lupus erythematosus, unspecified: Secondary | ICD-10-CM | POA: Diagnosis not present

## 2018-06-13 DIAGNOSIS — E1140 Type 2 diabetes mellitus with diabetic neuropathy, unspecified: Secondary | ICD-10-CM | POA: Diagnosis not present

## 2018-06-13 DIAGNOSIS — N183 Chronic kidney disease, stage 3 (moderate): Secondary | ICD-10-CM | POA: Diagnosis not present

## 2018-06-13 DIAGNOSIS — I5042 Chronic combined systolic (congestive) and diastolic (congestive) heart failure: Secondary | ICD-10-CM | POA: Diagnosis not present

## 2018-06-13 DIAGNOSIS — I2510 Atherosclerotic heart disease of native coronary artery without angina pectoris: Secondary | ICD-10-CM | POA: Diagnosis not present

## 2018-06-13 DIAGNOSIS — Z794 Long term (current) use of insulin: Secondary | ICD-10-CM | POA: Diagnosis not present

## 2018-06-13 DIAGNOSIS — M109 Gout, unspecified: Secondary | ICD-10-CM | POA: Diagnosis not present

## 2018-06-13 DIAGNOSIS — M170 Bilateral primary osteoarthritis of knee: Secondary | ICD-10-CM | POA: Diagnosis not present

## 2018-06-13 DIAGNOSIS — S92331D Displaced fracture of third metatarsal bone, right foot, subsequent encounter for fracture with routine healing: Secondary | ICD-10-CM | POA: Diagnosis not present

## 2018-06-13 DIAGNOSIS — M797 Fibromyalgia: Secondary | ICD-10-CM | POA: Diagnosis not present

## 2018-06-15 DIAGNOSIS — M170 Bilateral primary osteoarthritis of knee: Secondary | ICD-10-CM | POA: Diagnosis not present

## 2018-06-15 DIAGNOSIS — I2510 Atherosclerotic heart disease of native coronary artery without angina pectoris: Secondary | ICD-10-CM | POA: Diagnosis not present

## 2018-06-15 DIAGNOSIS — D631 Anemia in chronic kidney disease: Secondary | ICD-10-CM | POA: Diagnosis not present

## 2018-06-15 DIAGNOSIS — E1140 Type 2 diabetes mellitus with diabetic neuropathy, unspecified: Secondary | ICD-10-CM | POA: Diagnosis not present

## 2018-06-15 DIAGNOSIS — N183 Chronic kidney disease, stage 3 (moderate): Secondary | ICD-10-CM | POA: Diagnosis not present

## 2018-06-15 DIAGNOSIS — M797 Fibromyalgia: Secondary | ICD-10-CM | POA: Diagnosis not present

## 2018-06-15 DIAGNOSIS — M329 Systemic lupus erythematosus, unspecified: Secondary | ICD-10-CM | POA: Diagnosis not present

## 2018-06-15 DIAGNOSIS — G3184 Mild cognitive impairment, so stated: Secondary | ICD-10-CM | POA: Diagnosis not present

## 2018-06-15 DIAGNOSIS — S92331D Displaced fracture of third metatarsal bone, right foot, subsequent encounter for fracture with routine healing: Secondary | ICD-10-CM | POA: Diagnosis not present

## 2018-06-15 DIAGNOSIS — Z9181 History of falling: Secondary | ICD-10-CM | POA: Diagnosis not present

## 2018-06-15 DIAGNOSIS — I130 Hypertensive heart and chronic kidney disease with heart failure and stage 1 through stage 4 chronic kidney disease, or unspecified chronic kidney disease: Secondary | ICD-10-CM | POA: Diagnosis not present

## 2018-06-15 DIAGNOSIS — J449 Chronic obstructive pulmonary disease, unspecified: Secondary | ICD-10-CM | POA: Diagnosis not present

## 2018-06-15 DIAGNOSIS — I5042 Chronic combined systolic (congestive) and diastolic (congestive) heart failure: Secondary | ICD-10-CM | POA: Diagnosis not present

## 2018-06-15 DIAGNOSIS — Z794 Long term (current) use of insulin: Secondary | ICD-10-CM | POA: Diagnosis not present

## 2018-06-15 DIAGNOSIS — Z7982 Long term (current) use of aspirin: Secondary | ICD-10-CM | POA: Diagnosis not present

## 2018-06-15 DIAGNOSIS — M109 Gout, unspecified: Secondary | ICD-10-CM | POA: Diagnosis not present

## 2018-06-16 DIAGNOSIS — M797 Fibromyalgia: Secondary | ICD-10-CM | POA: Diagnosis not present

## 2018-06-16 DIAGNOSIS — N183 Chronic kidney disease, stage 3 (moderate): Secondary | ICD-10-CM | POA: Diagnosis not present

## 2018-06-16 DIAGNOSIS — M170 Bilateral primary osteoarthritis of knee: Secondary | ICD-10-CM | POA: Diagnosis not present

## 2018-06-16 DIAGNOSIS — Z794 Long term (current) use of insulin: Secondary | ICD-10-CM | POA: Diagnosis not present

## 2018-06-16 DIAGNOSIS — M109 Gout, unspecified: Secondary | ICD-10-CM | POA: Diagnosis not present

## 2018-06-16 DIAGNOSIS — D631 Anemia in chronic kidney disease: Secondary | ICD-10-CM | POA: Diagnosis not present

## 2018-06-16 DIAGNOSIS — I2510 Atherosclerotic heart disease of native coronary artery without angina pectoris: Secondary | ICD-10-CM | POA: Diagnosis not present

## 2018-06-16 DIAGNOSIS — E1140 Type 2 diabetes mellitus with diabetic neuropathy, unspecified: Secondary | ICD-10-CM | POA: Diagnosis not present

## 2018-06-16 DIAGNOSIS — J449 Chronic obstructive pulmonary disease, unspecified: Secondary | ICD-10-CM | POA: Diagnosis not present

## 2018-06-16 DIAGNOSIS — I5042 Chronic combined systolic (congestive) and diastolic (congestive) heart failure: Secondary | ICD-10-CM | POA: Diagnosis not present

## 2018-06-16 DIAGNOSIS — I130 Hypertensive heart and chronic kidney disease with heart failure and stage 1 through stage 4 chronic kidney disease, or unspecified chronic kidney disease: Secondary | ICD-10-CM | POA: Diagnosis not present

## 2018-06-16 DIAGNOSIS — Z9181 History of falling: Secondary | ICD-10-CM | POA: Diagnosis not present

## 2018-06-16 DIAGNOSIS — S92331D Displaced fracture of third metatarsal bone, right foot, subsequent encounter for fracture with routine healing: Secondary | ICD-10-CM | POA: Diagnosis not present

## 2018-06-16 DIAGNOSIS — M329 Systemic lupus erythematosus, unspecified: Secondary | ICD-10-CM | POA: Diagnosis not present

## 2018-06-16 DIAGNOSIS — Z7982 Long term (current) use of aspirin: Secondary | ICD-10-CM | POA: Diagnosis not present

## 2018-06-16 DIAGNOSIS — G3184 Mild cognitive impairment, so stated: Secondary | ICD-10-CM | POA: Diagnosis not present

## 2018-06-19 ENCOUNTER — Other Ambulatory Visit: Payer: Self-pay | Admitting: Family Medicine

## 2018-06-20 DIAGNOSIS — D631 Anemia in chronic kidney disease: Secondary | ICD-10-CM | POA: Diagnosis not present

## 2018-06-20 DIAGNOSIS — Z9181 History of falling: Secondary | ICD-10-CM | POA: Diagnosis not present

## 2018-06-20 DIAGNOSIS — E1140 Type 2 diabetes mellitus with diabetic neuropathy, unspecified: Secondary | ICD-10-CM | POA: Diagnosis not present

## 2018-06-20 DIAGNOSIS — M170 Bilateral primary osteoarthritis of knee: Secondary | ICD-10-CM | POA: Diagnosis not present

## 2018-06-20 DIAGNOSIS — S92331D Displaced fracture of third metatarsal bone, right foot, subsequent encounter for fracture with routine healing: Secondary | ICD-10-CM | POA: Diagnosis not present

## 2018-06-20 DIAGNOSIS — I2510 Atherosclerotic heart disease of native coronary artery without angina pectoris: Secondary | ICD-10-CM | POA: Diagnosis not present

## 2018-06-20 DIAGNOSIS — M109 Gout, unspecified: Secondary | ICD-10-CM | POA: Diagnosis not present

## 2018-06-20 DIAGNOSIS — I5042 Chronic combined systolic (congestive) and diastolic (congestive) heart failure: Secondary | ICD-10-CM | POA: Diagnosis not present

## 2018-06-20 DIAGNOSIS — N183 Chronic kidney disease, stage 3 (moderate): Secondary | ICD-10-CM | POA: Diagnosis not present

## 2018-06-20 DIAGNOSIS — Z7982 Long term (current) use of aspirin: Secondary | ICD-10-CM | POA: Diagnosis not present

## 2018-06-20 DIAGNOSIS — I130 Hypertensive heart and chronic kidney disease with heart failure and stage 1 through stage 4 chronic kidney disease, or unspecified chronic kidney disease: Secondary | ICD-10-CM | POA: Diagnosis not present

## 2018-06-20 DIAGNOSIS — M329 Systemic lupus erythematosus, unspecified: Secondary | ICD-10-CM | POA: Diagnosis not present

## 2018-06-20 DIAGNOSIS — G3184 Mild cognitive impairment, so stated: Secondary | ICD-10-CM | POA: Diagnosis not present

## 2018-06-20 DIAGNOSIS — Z794 Long term (current) use of insulin: Secondary | ICD-10-CM | POA: Diagnosis not present

## 2018-06-20 DIAGNOSIS — M797 Fibromyalgia: Secondary | ICD-10-CM | POA: Diagnosis not present

## 2018-06-20 DIAGNOSIS — J449 Chronic obstructive pulmonary disease, unspecified: Secondary | ICD-10-CM | POA: Diagnosis not present

## 2018-06-21 ENCOUNTER — Encounter: Payer: Self-pay | Admitting: Family Medicine

## 2018-06-21 DIAGNOSIS — E119 Type 2 diabetes mellitus without complications: Secondary | ICD-10-CM | POA: Diagnosis not present

## 2018-06-21 LAB — HM DIABETES EYE EXAM

## 2018-06-23 DIAGNOSIS — G3184 Mild cognitive impairment, so stated: Secondary | ICD-10-CM | POA: Diagnosis not present

## 2018-06-23 DIAGNOSIS — M797 Fibromyalgia: Secondary | ICD-10-CM | POA: Diagnosis not present

## 2018-06-23 DIAGNOSIS — I2510 Atherosclerotic heart disease of native coronary artery without angina pectoris: Secondary | ICD-10-CM | POA: Diagnosis not present

## 2018-06-23 DIAGNOSIS — Z7982 Long term (current) use of aspirin: Secondary | ICD-10-CM | POA: Diagnosis not present

## 2018-06-23 DIAGNOSIS — I130 Hypertensive heart and chronic kidney disease with heart failure and stage 1 through stage 4 chronic kidney disease, or unspecified chronic kidney disease: Secondary | ICD-10-CM | POA: Diagnosis not present

## 2018-06-23 DIAGNOSIS — J449 Chronic obstructive pulmonary disease, unspecified: Secondary | ICD-10-CM | POA: Diagnosis not present

## 2018-06-23 DIAGNOSIS — Z9181 History of falling: Secondary | ICD-10-CM | POA: Diagnosis not present

## 2018-06-23 DIAGNOSIS — S92331D Displaced fracture of third metatarsal bone, right foot, subsequent encounter for fracture with routine healing: Secondary | ICD-10-CM | POA: Diagnosis not present

## 2018-06-23 DIAGNOSIS — N183 Chronic kidney disease, stage 3 (moderate): Secondary | ICD-10-CM | POA: Diagnosis not present

## 2018-06-23 DIAGNOSIS — M329 Systemic lupus erythematosus, unspecified: Secondary | ICD-10-CM | POA: Diagnosis not present

## 2018-06-23 DIAGNOSIS — M109 Gout, unspecified: Secondary | ICD-10-CM | POA: Diagnosis not present

## 2018-06-23 DIAGNOSIS — D631 Anemia in chronic kidney disease: Secondary | ICD-10-CM | POA: Diagnosis not present

## 2018-06-23 DIAGNOSIS — Z794 Long term (current) use of insulin: Secondary | ICD-10-CM | POA: Diagnosis not present

## 2018-06-23 DIAGNOSIS — M170 Bilateral primary osteoarthritis of knee: Secondary | ICD-10-CM | POA: Diagnosis not present

## 2018-06-23 DIAGNOSIS — I5042 Chronic combined systolic (congestive) and diastolic (congestive) heart failure: Secondary | ICD-10-CM | POA: Diagnosis not present

## 2018-06-23 DIAGNOSIS — E1140 Type 2 diabetes mellitus with diabetic neuropathy, unspecified: Secondary | ICD-10-CM | POA: Diagnosis not present

## 2018-06-26 DIAGNOSIS — S92331D Displaced fracture of third metatarsal bone, right foot, subsequent encounter for fracture with routine healing: Secondary | ICD-10-CM | POA: Diagnosis not present

## 2018-06-26 DIAGNOSIS — J449 Chronic obstructive pulmonary disease, unspecified: Secondary | ICD-10-CM | POA: Diagnosis not present

## 2018-06-26 DIAGNOSIS — I130 Hypertensive heart and chronic kidney disease with heart failure and stage 1 through stage 4 chronic kidney disease, or unspecified chronic kidney disease: Secondary | ICD-10-CM | POA: Diagnosis not present

## 2018-06-26 DIAGNOSIS — Z794 Long term (current) use of insulin: Secondary | ICD-10-CM | POA: Diagnosis not present

## 2018-06-26 DIAGNOSIS — N183 Chronic kidney disease, stage 3 (moderate): Secondary | ICD-10-CM | POA: Diagnosis not present

## 2018-06-26 DIAGNOSIS — I2510 Atherosclerotic heart disease of native coronary artery without angina pectoris: Secondary | ICD-10-CM | POA: Diagnosis not present

## 2018-06-26 DIAGNOSIS — G3184 Mild cognitive impairment, so stated: Secondary | ICD-10-CM | POA: Diagnosis not present

## 2018-06-26 DIAGNOSIS — M797 Fibromyalgia: Secondary | ICD-10-CM | POA: Diagnosis not present

## 2018-06-26 DIAGNOSIS — E1140 Type 2 diabetes mellitus with diabetic neuropathy, unspecified: Secondary | ICD-10-CM | POA: Diagnosis not present

## 2018-06-26 DIAGNOSIS — Z9181 History of falling: Secondary | ICD-10-CM | POA: Diagnosis not present

## 2018-06-26 DIAGNOSIS — M170 Bilateral primary osteoarthritis of knee: Secondary | ICD-10-CM | POA: Diagnosis not present

## 2018-06-26 DIAGNOSIS — Z7982 Long term (current) use of aspirin: Secondary | ICD-10-CM | POA: Diagnosis not present

## 2018-06-26 DIAGNOSIS — M109 Gout, unspecified: Secondary | ICD-10-CM | POA: Diagnosis not present

## 2018-06-26 DIAGNOSIS — I5042 Chronic combined systolic (congestive) and diastolic (congestive) heart failure: Secondary | ICD-10-CM | POA: Diagnosis not present

## 2018-06-26 DIAGNOSIS — M329 Systemic lupus erythematosus, unspecified: Secondary | ICD-10-CM | POA: Diagnosis not present

## 2018-06-26 DIAGNOSIS — D631 Anemia in chronic kidney disease: Secondary | ICD-10-CM | POA: Diagnosis not present

## 2018-06-27 DIAGNOSIS — I5042 Chronic combined systolic (congestive) and diastolic (congestive) heart failure: Secondary | ICD-10-CM | POA: Diagnosis not present

## 2018-06-27 DIAGNOSIS — E1140 Type 2 diabetes mellitus with diabetic neuropathy, unspecified: Secondary | ICD-10-CM | POA: Diagnosis not present

## 2018-06-27 DIAGNOSIS — M170 Bilateral primary osteoarthritis of knee: Secondary | ICD-10-CM | POA: Diagnosis not present

## 2018-06-27 DIAGNOSIS — G3184 Mild cognitive impairment, so stated: Secondary | ICD-10-CM | POA: Diagnosis not present

## 2018-06-27 DIAGNOSIS — Z794 Long term (current) use of insulin: Secondary | ICD-10-CM | POA: Diagnosis not present

## 2018-06-27 DIAGNOSIS — I130 Hypertensive heart and chronic kidney disease with heart failure and stage 1 through stage 4 chronic kidney disease, or unspecified chronic kidney disease: Secondary | ICD-10-CM | POA: Diagnosis not present

## 2018-06-27 DIAGNOSIS — M797 Fibromyalgia: Secondary | ICD-10-CM | POA: Diagnosis not present

## 2018-06-27 DIAGNOSIS — N183 Chronic kidney disease, stage 3 (moderate): Secondary | ICD-10-CM | POA: Diagnosis not present

## 2018-06-27 DIAGNOSIS — J449 Chronic obstructive pulmonary disease, unspecified: Secondary | ICD-10-CM | POA: Diagnosis not present

## 2018-06-27 DIAGNOSIS — I2510 Atherosclerotic heart disease of native coronary artery without angina pectoris: Secondary | ICD-10-CM | POA: Diagnosis not present

## 2018-06-27 DIAGNOSIS — S92331D Displaced fracture of third metatarsal bone, right foot, subsequent encounter for fracture with routine healing: Secondary | ICD-10-CM | POA: Diagnosis not present

## 2018-06-27 DIAGNOSIS — Z7982 Long term (current) use of aspirin: Secondary | ICD-10-CM | POA: Diagnosis not present

## 2018-06-27 DIAGNOSIS — D631 Anemia in chronic kidney disease: Secondary | ICD-10-CM | POA: Diagnosis not present

## 2018-06-27 DIAGNOSIS — M109 Gout, unspecified: Secondary | ICD-10-CM | POA: Diagnosis not present

## 2018-06-27 DIAGNOSIS — M329 Systemic lupus erythematosus, unspecified: Secondary | ICD-10-CM | POA: Diagnosis not present

## 2018-06-27 DIAGNOSIS — Z9181 History of falling: Secondary | ICD-10-CM | POA: Diagnosis not present

## 2018-06-29 DIAGNOSIS — M797 Fibromyalgia: Secondary | ICD-10-CM | POA: Diagnosis not present

## 2018-06-29 DIAGNOSIS — M170 Bilateral primary osteoarthritis of knee: Secondary | ICD-10-CM | POA: Diagnosis not present

## 2018-06-29 DIAGNOSIS — J449 Chronic obstructive pulmonary disease, unspecified: Secondary | ICD-10-CM | POA: Diagnosis not present

## 2018-06-29 DIAGNOSIS — I5042 Chronic combined systolic (congestive) and diastolic (congestive) heart failure: Secondary | ICD-10-CM | POA: Diagnosis not present

## 2018-06-29 DIAGNOSIS — Z9181 History of falling: Secondary | ICD-10-CM | POA: Diagnosis not present

## 2018-06-29 DIAGNOSIS — M109 Gout, unspecified: Secondary | ICD-10-CM | POA: Diagnosis not present

## 2018-06-29 DIAGNOSIS — M329 Systemic lupus erythematosus, unspecified: Secondary | ICD-10-CM | POA: Diagnosis not present

## 2018-06-29 DIAGNOSIS — G3184 Mild cognitive impairment, so stated: Secondary | ICD-10-CM | POA: Diagnosis not present

## 2018-06-29 DIAGNOSIS — I130 Hypertensive heart and chronic kidney disease with heart failure and stage 1 through stage 4 chronic kidney disease, or unspecified chronic kidney disease: Secondary | ICD-10-CM | POA: Diagnosis not present

## 2018-06-29 DIAGNOSIS — S92331D Displaced fracture of third metatarsal bone, right foot, subsequent encounter for fracture with routine healing: Secondary | ICD-10-CM | POA: Diagnosis not present

## 2018-06-29 DIAGNOSIS — Z794 Long term (current) use of insulin: Secondary | ICD-10-CM | POA: Diagnosis not present

## 2018-06-29 DIAGNOSIS — I2510 Atherosclerotic heart disease of native coronary artery without angina pectoris: Secondary | ICD-10-CM | POA: Diagnosis not present

## 2018-06-29 DIAGNOSIS — E1140 Type 2 diabetes mellitus with diabetic neuropathy, unspecified: Secondary | ICD-10-CM | POA: Diagnosis not present

## 2018-06-29 DIAGNOSIS — Z7982 Long term (current) use of aspirin: Secondary | ICD-10-CM | POA: Diagnosis not present

## 2018-06-29 DIAGNOSIS — N183 Chronic kidney disease, stage 3 (moderate): Secondary | ICD-10-CM | POA: Diagnosis not present

## 2018-06-29 DIAGNOSIS — D631 Anemia in chronic kidney disease: Secondary | ICD-10-CM | POA: Diagnosis not present

## 2018-06-30 DIAGNOSIS — M170 Bilateral primary osteoarthritis of knee: Secondary | ICD-10-CM | POA: Diagnosis not present

## 2018-06-30 DIAGNOSIS — Z794 Long term (current) use of insulin: Secondary | ICD-10-CM | POA: Diagnosis not present

## 2018-06-30 DIAGNOSIS — E1140 Type 2 diabetes mellitus with diabetic neuropathy, unspecified: Secondary | ICD-10-CM | POA: Diagnosis not present

## 2018-06-30 DIAGNOSIS — M797 Fibromyalgia: Secondary | ICD-10-CM | POA: Diagnosis not present

## 2018-06-30 DIAGNOSIS — I5042 Chronic combined systolic (congestive) and diastolic (congestive) heart failure: Secondary | ICD-10-CM | POA: Diagnosis not present

## 2018-06-30 DIAGNOSIS — N183 Chronic kidney disease, stage 3 (moderate): Secondary | ICD-10-CM | POA: Diagnosis not present

## 2018-06-30 DIAGNOSIS — I130 Hypertensive heart and chronic kidney disease with heart failure and stage 1 through stage 4 chronic kidney disease, or unspecified chronic kidney disease: Secondary | ICD-10-CM | POA: Diagnosis not present

## 2018-06-30 DIAGNOSIS — J449 Chronic obstructive pulmonary disease, unspecified: Secondary | ICD-10-CM | POA: Diagnosis not present

## 2018-06-30 DIAGNOSIS — G3184 Mild cognitive impairment, so stated: Secondary | ICD-10-CM | POA: Diagnosis not present

## 2018-06-30 DIAGNOSIS — D631 Anemia in chronic kidney disease: Secondary | ICD-10-CM | POA: Diagnosis not present

## 2018-06-30 DIAGNOSIS — I2510 Atherosclerotic heart disease of native coronary artery without angina pectoris: Secondary | ICD-10-CM | POA: Diagnosis not present

## 2018-06-30 DIAGNOSIS — M109 Gout, unspecified: Secondary | ICD-10-CM | POA: Diagnosis not present

## 2018-06-30 DIAGNOSIS — Z7982 Long term (current) use of aspirin: Secondary | ICD-10-CM | POA: Diagnosis not present

## 2018-06-30 DIAGNOSIS — M329 Systemic lupus erythematosus, unspecified: Secondary | ICD-10-CM | POA: Diagnosis not present

## 2018-06-30 DIAGNOSIS — S92331D Displaced fracture of third metatarsal bone, right foot, subsequent encounter for fracture with routine healing: Secondary | ICD-10-CM | POA: Diagnosis not present

## 2018-06-30 DIAGNOSIS — Z9181 History of falling: Secondary | ICD-10-CM | POA: Diagnosis not present

## 2018-07-03 ENCOUNTER — Ambulatory Visit (INDEPENDENT_AMBULATORY_CARE_PROVIDER_SITE_OTHER): Payer: Medicare Other | Admitting: Family Medicine

## 2018-07-03 ENCOUNTER — Other Ambulatory Visit: Payer: Self-pay | Admitting: *Deleted

## 2018-07-03 ENCOUNTER — Encounter: Payer: Self-pay | Admitting: Family Medicine

## 2018-07-03 VITALS — BP 125/83 | HR 91 | Temp 99.0°F | Resp 16 | Ht 63.0 in | Wt 270.2 lb

## 2018-07-03 DIAGNOSIS — K219 Gastro-esophageal reflux disease without esophagitis: Secondary | ICD-10-CM | POA: Diagnosis not present

## 2018-07-03 DIAGNOSIS — G40209 Localization-related (focal) (partial) symptomatic epilepsy and epileptic syndromes with complex partial seizures, not intractable, without status epilepticus: Secondary | ICD-10-CM

## 2018-07-03 DIAGNOSIS — F329 Major depressive disorder, single episode, unspecified: Secondary | ICD-10-CM | POA: Diagnosis not present

## 2018-07-03 DIAGNOSIS — F32A Depression, unspecified: Secondary | ICD-10-CM

## 2018-07-03 MED ORDER — ACCU-CHEK SOFTCLIX LANCETS MISC: 3 refills | Status: DC

## 2018-07-03 MED ORDER — RANITIDINE HCL 150 MG PO TABS: 150.0000 mg | ORAL_TABLET | Freq: Two times a day (BID) | ORAL | 2 refills | Status: DC

## 2018-07-03 NOTE — Patient Instructions (Addendum)
A few things to remember from today's visit:   Gastroesophageal reflux disease, esophagitis presence not specified - Plan: ranitidine (ZANTAC) 150 MG tablet  Depression, unspecified depression type   GERD:  Avoid foods that make your symptoms worse, for example coffee, chocolate,pepermeint,alcohol, and greasy food. Raising the head of your bed about 6 inches may help with nocturnal symptoms.  Avoid tobacco use. Weight loss (if you are overweight). Avoid lying down for 3 hours after eating.  Instead 3 large meals daily try small and more frequent meals during the day.  Zantac 150 mg twice daily added today. For now no changes in Protonix but will consider changing to a different medication if symptoms are persistent.   You should be evaluated immediately if bloody vomiting, bloody stools, black stools (like tar), difficulty swallowing, food gets stuck on the way down or choking when eating. Abnormal weight loss or severe abdominal pain.  If symptoms are not resolved sometimes endoscopy is necessary.   Please be sure medication list is accurate. If a new problem present, please set up appointment sooner than planned today.

## 2018-07-03 NOTE — Progress Notes (Signed)
ACUTE VISIT   HPI:  Chief Complaint  Patient presents with  . Gastroesophageal Reflux    started 3 weeks ago    Brittany Roberts is a 77 y.o. female, who is here today complaining of worsening epigastric burning like pain graduated to mid sternal and heartburn. Throat burning sensation after eating.  + Acid reflux. Exacerbated by food intake, any type of food. Before problem was aggravated by certain foods like onions, which she avoids to eat. Alleviated by burping and/or with passing gas. Epigastric pain is intermittent,no radiated,moderate  She is on Protonix 40 mg daily. Baking soda helps sometimes.   She has been on Nexium,Omeprazole,and Sucralfate, among some. Denies dysphagia.  -She is also upset today because she has not been able to drive. She needs DMV forms completed, they were sent to her neurologist, Dr Delice Lesch.She was hoping that she would be allow ed to drive again. Her daughter cannot always bring her to her appt or to buy groceries.  Hx of seizures, she is on Keppra 500 mg bid. Denies having seizure episodes in the past 6 months. Follows with neuro annually.   Crying here in the office. + Anxious. Denies suicidal thoughts.                                 Review of Systems  Constitutional: Positive for fatigue. Negative for appetite change, chills and fever.  HENT: Positive for sore throat. Negative for mouth sores and trouble swallowing.   Eyes: Negative for visual disturbance.  Respiratory: Negative for cough, shortness of breath and wheezing.   Cardiovascular: Negative for palpitations and leg swelling.  Gastrointestinal: Negative for abdominal pain, blood in stool, nausea and vomiting.  Endocrine: Negative for cold intolerance and heat intolerance.  Genitourinary: Negative for decreased urine volume, dysuria and hematuria.  Musculoskeletal: Positive for gait problem and myalgias.  Skin: Negative for color change and rash.    Allergic/Immunologic: Positive for environmental allergies.  Neurological: Negative for seizures, syncope, weakness and headaches.  Psychiatric/Behavioral: Negative for confusion and sleep disturbance.      Current Outpatient Medications on File Prior to Visit  Medication Sig Dispense Refill  . acetaminophen (TYLENOL) 325 MG tablet Take 650 mg by mouth every 6 (six) hours as needed for mild pain or moderate pain.    Marland Kitchen albuterol (PROAIR HFA) 108 (90 Base) MCG/ACT inhaler inhale 2 puffs by mouth every 4 hours if needed USE ONLY IF YOU ARE WHEEZING 1 Inhaler 0  . allopurinol (ZYLOPRIM) 100 MG tablet Take 1 tablet (100 mg total) by mouth daily. 90 tablet 3  . amLODipine (NORVASC) 5 MG tablet Take 1 tablet (5 mg total) by mouth daily. 90 tablet 2  . Ascorbic Acid (VITAMIN C PO) Take 1 tablet by mouth daily.    Marland Kitchen aspirin EC 81 MG tablet Take 81 mg by mouth daily.     Marland Kitchen atorvastatin (LIPITOR) 20 MG tablet TAKE 1 TABLET BY MOUTH DAILY 30 tablet 0  . B Complex Vitamins (VITAMIN B COMPLEX) TABS Take 1 tablet by mouth daily.    . Blood Glucose Monitoring Suppl (ACCU-CHEK AVIVA) device Use to test blood sugar three times a day. Dx. E11.9 1 each 1  . Cholecalciferol (VITAMIN D3) 5000 UNITS CAPS Take 5,000 Units by mouth daily.    . diclofenac sodium (VOLTAREN) 1 % GEL Apply 4 g topically 4 (four) times daily. 4 Tube 3  .  ferrous sulfate 325 (65 FE) MG tablet Take 325 mg by mouth daily with breakfast.    . fluticasone (FLOVENT HFA) 110 MCG/ACT inhaler Inhale 2 puffs into the lungs 2 (two) times daily. 1 Inhaler 4  . furosemide (LASIX) 40 MG tablet Take 1 tablet (40 mg total) by mouth 2 (two) times daily. 180 tablet 2  . gabapentin (NEURONTIN) 300 MG capsule TAKE 1 CAPSULE BY MOUTH TWICE DAILY 60 capsule 3  . guaiFENesin (ROBITUSSIN) 100 MG/5ML liquid Take 200 mg by mouth 3 (three) times daily as needed for cough.    . Insulin NPH, Human,, Isophane, (HUMULIN N KWIKPEN) 100 UNIT/ML Kiwkpen INJECT 30 UNITS  SUBCUTANEOUSLY EVERY MORNING 30 mL 3  . Insulin Pen Needle (B-D ULTRAFINE III SHORT PEN) 31G X 8 MM MISC CHECK BLOOD SUGAR 3 TIMES A DAY 100 each 3  . levETIRAcetam (KEPPRA) 500 MG tablet Take 1 tablet (500 mg total) by mouth 2 (two) times daily. 180 tablet 3  . metoprolol tartrate (LOPRESSOR) 50 MG tablet TAKE 1 TABLET BY MOUTH EVERY DAY WITH OR IMME DIATELY FOLLOWING A MEAL FOR HYPERTENSION 90 tablet 0  . montelukast (SINGULAIR) 10 MG tablet Take 1 tablet (10 mg total) by mouth at bedtime. 30 tablet 0  . Multiple Vitamin (MULTIVITAMIN WITH MINERALS) TABS tablet Take 1 tablet by mouth daily.    . nitroGLYCERIN (NITROSTAT) 0.4 MG SL tablet Place 1 tablet (0.4 mg total) under the tongue every 5 (five) minutes as needed for chest pain. 20 tablet 12  . Olopatadine HCl (PAZEO) 0.7 % SOLN Apply 1 drop to eye daily. 1 Bottle 2  . pantoprazole (PROTONIX) 40 MG tablet Take 1 tablet (40 mg total) by mouth daily. 90 tablet 3  . Polyethyl Glycol-Propyl Glycol (SYSTANE ULTRA OP) Place 1 drop into both eyes every 6 (six) hours as needed (dry eyes).    . potassium chloride (K-DUR,KLOR-CON) 10 MEQ tablet Take 1 tablet by mouth 2 (two) times daily.  2   No current facility-administered medications on file prior to visit.      Past Medical History:  Diagnosis Date  . Acute delirium 01/14/2017  . Anxiety   . Arthritis    "all over"  . Asthma   . Bipolar disorder ([place])   . Complication of anesthesia    "w/right foot OR they gave me too much and they couldn't get me woke"  . Congestive heart failure, unspecified   . Coronary artery calcification seen on CAT scan 01/14/2017  . Coronary artery disease    "I've got 1 stent" (06/30/2015)  . Depression   . Discoid lupus   . Fibromyalgia    "I've been told I have this; can't take Lyrica cause I'm allergic to it" (06/30/2015)  . GERD (gastroesophageal reflux disease)   . History of gout   . History of hiatal hernia    "real bad" (06/30/2015)  . Hypertension    . Hypothyroidism   . On home oxygen therapy    "2L q hs" (06/30/2015)  . OSA (obstructive sleep apnea)    "just use my oxygen; no mask" (06/30/2015)  . Other and unspecified hyperlipidemia   . Penetrating foot wound    left nonhealing foot wound on the dorsal surface  . Peripheral neuropathy   . Pneumonia "many of times"  . Refusal of blood transfusions as patient is Jehovah's Witness   . Renal failure, unspecified    "kidneys not working 100%" (06/30/2015)  . Sickle cell trait ([place])   .  Systemic lupus ([place])   . Thoracic aortic atherosclerosis ([place]) 01/14/2017  . Type II diabetes mellitus ([place])   . Unspecified vitamin D deficiency    Allergies  Allergen Reactions  . Ace Inhibitors Other (See Comments)    Coincided with sig bump in creat. Retried and creat bumped again. Coincided with sig bump in creat. Retried and creat bumped again. Coincided with sig bump in creat. Retried and creat bumped again. Other reaction(s): Other (See Comments) Coincided with sig bump in creat. Retried and creat bumped again. Coincided with sig bump in creat. Retried and creat bumped again.  . Isosorb Dinitrate-Hydralazine Other (See Comments)    Sleep all the time. Sleep all the time. Other reaction(s): Other (See Comments) Sleep all the time.  Marland Kitchen Penicillins Anaphylaxis, Swelling and Rash    Has patient had a PCN reaction causing immediate rash, facial/tongue/throat swelling, SOB or lightheadedness with hypotension: Yes Has patient had a PCN reaction causing severe rash involving mucus membranes or skin necrosis: Yes Has patient had a PCN reaction that required hospitalization: Yes Has patient had a PCN reaction occurring within the last 10 years: No If all of the above answers are "NO", then may proceed with Cephalosporin use.   Marland Kitchen Buspirone Other (See Comments)    pain pain pain Other reaction(s): Other (See Comments) pain pain  . Pregabalin Swelling  . Ropinirole Hydrochloride Swelling    . Amantadines Rash    "Swelling of the tongue"    Social History   Socioeconomic History  . Marital status: Single    Spouse name: Not on file  . Number of children: 4  . Years of education: 53 th  . Highest education level: Not on file  Occupational History  . Occupation: Retired- Engineer, production    Employer: RETIRED  Social Needs  . Financial resource strain: Not on file  . Food insecurity:    Worry: Not on file    Inability: Not on file  . Transportation needs:    Medical: Not on file    Non-medical: Not on file  Tobacco Use  . Smoking status: Former Smoker    Packs/day: 3.00    Years: 40.00    Pack years: 120.00    Types: Cigarettes    Start date: 07/05/1960    Last attempt to quit: 07/05/1998    Years since quitting: 20.0  . Smokeless tobacco: Never Used  Substance and Sexual Activity  . Alcohol use: No    Alcohol/week: 0.0 standard drinks    Comment: 06/30/2015 "used to drink a little alcohol on the weekends; not q weekend; nothing in years"  . Drug use: No  . Sexual activity: Never  Lifestyle  . Physical activity:    Days per week: Not on file    Minutes per session: Not on file  . Stress: Not on file  Relationships  . Social connections:    Talks on phone: Not on file    Gets together: Not on file    Attends religious service: Not on file    Active member of club or organization: Not on file    Attends meetings of clubs or organizations: Not on file    Relationship status: Not on file  Other Topics Concern  . Not on file  Social History Narrative   Health Care POA:    Emergency Contact: daughter, Lorenso Courier [phone] or [name] [phone]   End of Life Plan: Does not want to be ventilated or feeding tubes.  Who lives with you: Lives alone   Any pets: none   Diet: Patient reports enjoying and eating junk food.  Does not regulate types of food and currently her dentures are broken so it is hard to eat some foods.   Exercise: Patient does  not have an exercise plan.   Seatbelts: Patient reports wearing seatbelt when in vehicle.   Sun Exposure/Protection: Patient reports not going outside very often.   Hobbies: Patient enjoys reading the bible and watching game shows.    Right-handed.   1 cup caffeine per day.   Former smoker-stopped 2000   Alcohol none    Vitals:   07/03/18 1554  BP: 125/83  Pulse: 91  Resp: 16  Temp: 99 F (37.2 C)  SpO2: 97%   Body mass index is 47.87 kg/m.   Physical Exam  Nursing note and vitals reviewed. Constitutional: She is oriented to person, place, and time. She appears well-developed. No distress.  HENT:  Head: Normocephalic and atraumatic.  Mouth/Throat: Oropharynx is clear and moist and mucous membranes are normal. She has dentures.  Eyes: Pupils are equal, round, and reactive to light. Conjunctivae are normal.  Cardiovascular: Normal rate and regular rhythm.  No murmur heard. Respiratory: Effort normal and breath sounds normal. No respiratory distress.  GI: Soft. She exhibits no mass. There is no abdominal tenderness.  Musculoskeletal:        General: No edema.  Lymphadenopathy:    She has no cervical adenopathy.  Neurological: She is alert and oriented to person, place, and time. She has normal strength. No cranial nerve deficit. Gait abnormal.  Gait assisted by a walker.  Skin: Skin is warm. No rash noted. No erythema.  Psychiatric: Her affect is labile. She expresses no suicidal ideation.  Fairly groomed, good eye contact.      ASSESSMENT AND PLAN:   Brittany Roberts was seen today for gastroesophageal reflux.  Diagnoses and all orders for this visit:  Gastroesophageal reflux disease, esophagitis presence not specified GERD precautions discussed and recommended. For now no changes in Protonix 40 mg. Zantac added.  If she is still symptomatic,GI referral will be considered.  -     ranitidine (ZANTAC) 150 MG tablet; Take 1 tablet (150 mg total) by mouth 2 (two) times  daily.  Depression, unspecified depression type She is not on pharmacologic treatment, she has refused medication. Psychiatric evaluation a few months ago, it was determined she did not have psychiatric disorder. Instructed about warning signs.  Localization-related symptomatic epilepsy and epileptic syndromes with complex partial seizures, not intractable, without status epilepticus ([place]) Explained how some of her chronic medical problems impaired her ability to drive.  She has next appt with Dr Delice Lesch in 01/2019, recommend arranging appt sooner if she wants to discuss driving restrictions.    FL2 was completed in 04/2018, her daughters are still looking for assisted living.    Return in about 6 weeks (around 08/14/2018) for GERD.      [name]. [name], MD  [name]. [place] office.

## 2018-07-06 ENCOUNTER — Encounter: Payer: Self-pay | Admitting: Family Medicine

## 2018-07-14 DIAGNOSIS — J45909 Unspecified asthma, uncomplicated: Secondary | ICD-10-CM | POA: Diagnosis not present

## 2018-07-17 DIAGNOSIS — I5030 Unspecified diastolic (congestive) heart failure: Secondary | ICD-10-CM | POA: Diagnosis not present

## 2018-07-17 DIAGNOSIS — I2510 Atherosclerotic heart disease of native coronary artery without angina pectoris: Secondary | ICD-10-CM | POA: Diagnosis not present

## 2018-07-17 DIAGNOSIS — I10 Essential (primary) hypertension: Secondary | ICD-10-CM | POA: Diagnosis not present

## 2018-07-17 DIAGNOSIS — Z9861 Coronary angioplasty status: Secondary | ICD-10-CM | POA: Diagnosis not present

## 2018-07-21 ENCOUNTER — Encounter: Payer: Self-pay | Admitting: Family Medicine

## 2018-07-21 ENCOUNTER — Ambulatory Visit (INDEPENDENT_AMBULATORY_CARE_PROVIDER_SITE_OTHER): Payer: Medicare Other | Admitting: Family Medicine

## 2018-07-21 VITALS — BP 126/84 | HR 88 | Temp 98.1°F | Resp 16 | Ht 63.0 in | Wt 266.4 lb

## 2018-07-21 DIAGNOSIS — R040 Epistaxis: Secondary | ICD-10-CM

## 2018-07-21 DIAGNOSIS — R1011 Right upper quadrant pain: Secondary | ICD-10-CM

## 2018-07-21 DIAGNOSIS — J309 Allergic rhinitis, unspecified: Secondary | ICD-10-CM | POA: Diagnosis not present

## 2018-07-21 DIAGNOSIS — K219 Gastro-esophageal reflux disease without esophagitis: Secondary | ICD-10-CM

## 2018-07-21 LAB — CBC
HCT: 37.1 % (ref 36.0–46.0)
Hemoglobin: 12.2 g/dL (ref 12.0–15.0)
MCHC: 32.9 g/dL (ref 30.0–36.0)
MCV: 74.6 fl — ABNORMAL LOW (ref 78.0–100.0)
Platelets: 229 10*3/uL (ref 150.0–400.0)
RBC: 4.97 Mil/uL (ref 3.87–5.11)
RDW: 15.2 % (ref 11.5–15.5)
WBC: 8.9 10*3/uL (ref 4.0–10.5)

## 2018-07-21 MED ORDER — FAMOTIDINE 20 MG PO TABS: 20.0000 mg | ORAL_TABLET | Freq: Two times a day (BID) | ORAL | 2 refills | Status: DC

## 2018-07-21 NOTE — Patient Instructions (Addendum)
A few things to remember from today's visit:   Allergic rhinitis, unspecified seasonality, unspecified trigger  Epistaxis  Gastroesophageal reflux disease, esophagitis presence not specified - Plan: famotidine (PEPCID) 20 MG tablet  Recommend increasing nose with saline water after during the day. If problem is persistent we may need to arrange appointment with ENT.  Please be sure medication list is accurate. If a new problem present, please set up appointment sooner than planned today.

## 2018-07-21 NOTE — Progress Notes (Signed)
ACUTE VISIT   HPI:  Chief Complaint  Patient presents with  . Nose bleeds    off and on, last one was Saturday    Brittany Roberts is a 78 y.o. female, who is here today with her daughter complaining of recurrent nose bleed for the past week. She has had episodes of epistaxis before but it seems more frequent. She thinks she was evaluated by ENT.  It is worse in the morning and with exertion. Denies red blood in stool, one black stool. No more bruising than usual,gum bleeding, or gross hematuria.  Usually mucus mixed with blood when blowing nose. "Pouring" bleeding from left nostril and down her throat that lasted "a while" 6 days ago. No Hx of trauma.  Hx of allergic rhinitis. + Nasal congestion,rhinorrhea,and post nasal drainage. She is on Singulair 10 mg daily. She is also using nasal Afrin.  She has not noted fever or chills.   She was having dizziness a few days ago. Her daughter states that recently evaluated at the cardiologist office, diagnosed with dehydration.  Furosemide dose was decreased. She is trying to drink "a lot of" water. Denies orthopnea or PND.  She is also c/o epigastric burning sensation. She has not been on Zantac because recall. She is on Protonix 40 mg daily.  + Heartburn and mid chest discomfort, the latter one at rest. No associated diaphoresis or dyspnea.  She is also c/o RUQ abdominal pain, points to scar s/p cholecystectomy. This has been going on for years. She is not sure about exacerbating or alleviating factors. It is intermittent and pain is "bad." Problem has been stable, it is not daily.  He daughter states that she has not heard her c/o pain.   Review of Systems  Constitutional: Positive for fatigue. Negative for activity change, appetite change and fever.  HENT: Positive for congestion, nosebleeds, postnasal drip, rhinorrhea and sore throat. Negative for facial swelling, mouth sores, sinus pressure, trouble swallowing  and voice change.   Eyes: Negative for discharge and redness.  Respiratory: Negative for cough, shortness of breath and wheezing.   Cardiovascular: Negative for chest pain.  Gastrointestinal: Negative for abdominal pain, diarrhea, nausea and vomiting.  Genitourinary: Negative for decreased urine volume, dysuria and hematuria.  Musculoskeletal: Positive for arthralgias, back pain and gait problem.  Skin: Negative for rash.  Allergic/Immunologic: Positive for environmental allergies.  Neurological: Negative for syncope, weakness and headaches.  Hematological: Negative for adenopathy. Does not bruise/bleed easily.  Psychiatric/Behavioral: Negative for confusion. The patient is nervous/anxious.       Current Outpatient Medications on File Prior to Visit  Medication Sig Dispense Refill  . ACCU-CHEK SOFTCLIX LANCETS lancets Use as instructed 100 each 3  . acetaminophen (TYLENOL) 325 MG tablet Take 650 mg by mouth every 6 (six) hours as needed for mild pain or moderate pain.    Marland Kitchen albuterol (PROAIR HFA) 108 (90 Base) MCG/ACT inhaler inhale 2 puffs by mouth every 4 hours if needed USE ONLY IF YOU ARE WHEEZING 1 Inhaler 0  . allopurinol (ZYLOPRIM) 100 MG tablet Take 1 tablet (100 mg total) by mouth daily. 90 tablet 3  . amLODipine (NORVASC) 10 MG tablet Take 10 mg by mouth daily.    . Ascorbic Acid (VITAMIN C PO) Take 1 tablet by mouth daily.    Marland Kitchen aspirin EC 81 MG tablet Take 81 mg by mouth daily.     Marland Kitchen atorvastatin (LIPITOR) 20 MG tablet TAKE 1 TABLET BY MOUTH  DAILY 30 tablet 0  . B Complex Vitamins (VITAMIN B COMPLEX) TABS Take 1 tablet by mouth daily.    . Blood Glucose Monitoring Suppl (ACCU-CHEK AVIVA) device Use to test blood sugar three times a day. Dx. E11.9 1 each 1  . Cholecalciferol (VITAMIN D3) 5000 UNITS CAPS Take 5,000 Units by mouth daily.    . diclofenac sodium (VOLTAREN) 1 % GEL Apply 4 g topically 4 (four) times daily. 4 Tube 3  . ferrous sulfate 325 (65 FE) MG tablet Take 325  mg by mouth daily with breakfast.    . fluticasone (FLOVENT HFA) 110 MCG/ACT inhaler Inhale 2 puffs into the lungs 2 (two) times daily. 1 Inhaler 4  . furosemide (LASIX) 40 MG tablet Take 1 tablet (40 mg total) by mouth 2 (two) times daily. 180 tablet 2  . gabapentin (NEURONTIN) 300 MG capsule TAKE 1 CAPSULE BY MOUTH TWICE DAILY 60 capsule 3  . guaiFENesin (ROBITUSSIN) 100 MG/5ML liquid Take 200 mg by mouth 3 (three) times daily as needed for cough.    . Insulin NPH, Human,, Isophane, (HUMULIN N KWIKPEN) 100 UNIT/ML Kiwkpen INJECT 30 UNITS SUBCUTANEOUSLY EVERY MORNING 30 mL 3  . Insulin Pen Needle (B-D ULTRAFINE III SHORT PEN) 31G X 8 MM MISC CHECK BLOOD SUGAR 3 TIMES A DAY 100 each 3  . levETIRAcetam (KEPPRA) 500 MG tablet Take 1 tablet (500 mg total) by mouth 2 (two) times daily. 180 tablet 3  . metoprolol tartrate (LOPRESSOR) 50 MG tablet TAKE 1 TABLET BY MOUTH EVERY DAY WITH OR IMME DIATELY FOLLOWING A MEAL FOR HYPERTENSION 90 tablet 0  . montelukast (SINGULAIR) 10 MG tablet Take 1 tablet (10 mg total) by mouth at bedtime. 30 tablet 0  . Multiple Vitamin (MULTIVITAMIN WITH MINERALS) TABS tablet Take 1 tablet by mouth daily.    . nitroGLYCERIN (NITROSTAT) 0.4 MG SL tablet Place 1 tablet (0.4 mg total) under the tongue every 5 (five) minutes as needed for chest pain. 20 tablet 12  . Olopatadine HCl (PAZEO) 0.7 % SOLN Apply 1 drop to eye daily. 1 Bottle 2  . pantoprazole (PROTONIX) 40 MG tablet Take 1 tablet (40 mg total) by mouth daily. 90 tablet 3  . Polyethyl Glycol-Propyl Glycol (SYSTANE ULTRA OP) Place 1 drop into both eyes every 6 (six) hours as needed (dry eyes).    . potassium chloride (K-DUR,KLOR-CON) 10 MEQ tablet Take 1 tablet by mouth 2 (two) times daily.  2   No current facility-administered medications on file prior to visit.      Past Medical History:  Diagnosis Date  . Acute delirium 01/14/2017  . Anxiety   . Arthritis    "all over"  . Asthma   . Bipolar disorder ([place])     . Complication of anesthesia    "w/right foot OR they gave me too much and they couldn't get me woke"  . Congestive heart failure, unspecified   . Coronary artery calcification seen on CAT scan 01/14/2017  . Coronary artery disease    "I've got 1 stent" (06/30/2015)  . Depression   . Discoid lupus   . Fibromyalgia    "I've been told I have this; can't take Lyrica cause I'm allergic to it" (06/30/2015)  . GERD (gastroesophageal reflux disease)   . History of gout   . History of hiatal hernia    "real bad" (06/30/2015)  . Hypertension   . Hypothyroidism   . On home oxygen therapy    "2L q hs" (06/30/2015)  .  OSA (obstructive sleep apnea)    "just use my oxygen; no mask" (06/30/2015)  . Other and unspecified hyperlipidemia   . Penetrating foot wound    left nonhealing foot wound on the dorsal surface  . Peripheral neuropathy   . Pneumonia "many of times"  . Refusal of blood transfusions as patient is Jehovah's Witness   . Renal failure, unspecified    "kidneys not working 100%" (06/30/2015)  . Sickle cell trait ([place])   . Systemic lupus ([place])   . Thoracic aortic atherosclerosis ([place]) 01/14/2017  . Type II diabetes mellitus ([place])   . Unspecified vitamin D deficiency    Allergies  Allergen Reactions  . Ace Inhibitors Other (See Comments)    Coincided with sig bump in creat. Retried and creat bumped again. Coincided with sig bump in creat. Retried and creat bumped again. Coincided with sig bump in creat. Retried and creat bumped again. Other reaction(s): Other (See Comments) Coincided with sig bump in creat. Retried and creat bumped again. Coincided with sig bump in creat. Retried and creat bumped again.  . Isosorb Dinitrate-Hydralazine Other (See Comments)    Sleep all the time. Sleep all the time. Other reaction(s): Other (See Comments) Sleep all the time.  Marland Kitchen Penicillins Anaphylaxis, Swelling and Rash    Has patient had a PCN reaction causing immediate rash,  facial/tongue/throat swelling, SOB or lightheadedness with hypotension: Yes Has patient had a PCN reaction causing severe rash involving mucus membranes or skin necrosis: Yes Has patient had a PCN reaction that required hospitalization: Yes Has patient had a PCN reaction occurring within the last 10 years: No If all of the above answers are "NO", then may proceed with Cephalosporin use.   Marland Kitchen Buspirone Other (See Comments)    pain pain pain Other reaction(s): Other (See Comments) pain pain  . Pregabalin Swelling  . Ropinirole Hydrochloride Swelling  . [place]antadines Rash    "Swelling of the tongue"    Social History   Socioeconomic History  . Marital status: Single    Spouse name: Not on file  . Number of children: 4  . Years of education: 25 th  . Highest education level: Not on file  Occupational History  . Occupation: Retired- Engineer, production    Employer: RETIRED  Social Needs  . Financial resource strain: Not on file  . Food insecurity:    Worry: Not on file    Inability: Not on file  . Transportation needs:    Medical: Not on file    Non-medical: Not on file  Tobacco Use  . Smoking status: Former Smoker    Packs/day: 3.00    Years: 40.00    Pack years: 120.00    Types: Cigarettes    Start date: 07/05/1960    Last attempt to quit: 07/05/1998    Years since quitting: 20.0  . Smokeless tobacco: Never Used  Substance and Sexual Activity  . Alcohol use: No    Alcohol/week: 0.0 standard drinks    Comment: 06/30/2015 "used to drink a little alcohol on the weekends; not q weekend; nothing in years"  . Drug use: No  . Sexual activity: Never  Lifestyle  . Physical activity:    Days per week: Not on file    Minutes per session: Not on file  . Stress: Not on file  Relationships  . Social connections:    Talks on phone: Not on file    Gets together: Not on file    Attends religious  service: Not on file    Active member of club or organization: Not on file     Attends meetings of clubs or organizations: Not on file    Relationship status: Not on file  Other Topics Concern  . Not on file  Social History Narrative   Health Care POA:    Emergency Contact: daughter, Lorenso Courier [phone] or [name] [phone]   End of Life Plan: Does not want to be ventilated or feeding tubes.    Who lives with you: Lives alone   Any pets: none   Diet: Patient reports enjoying and eating junk food.  Does not regulate types of food and currently her dentures are broken so it is hard to eat some foods.   Exercise: Patient does not have an exercise plan.   Seatbelts: Patient reports wearing seatbelt when in vehicle.   Sun Exposure/Protection: Patient reports not going outside very often.   Hobbies: Patient enjoys reading the bible and watching game shows.    Right-handed.   1 cup caffeine per day.   Former smoker-stopped 2000   Alcohol none    Vitals:   07/21/18 1113  BP: 126/84  Pulse: 88  Resp: 16  Temp: 98.1 F (36.7 C)  SpO2: 95%   Body mass index is 47.19 kg/m.   Physical Exam  Nursing note and vitals reviewed. Constitutional: She is oriented to person, place, and time. She appears well-developed. No distress.  HENT:  Head: Normocephalic and atraumatic.  Nose: Rhinorrhea and septal deviation present. No mucosal edema, sinus tenderness or nasal septal hematoma. No epistaxis. Right sinus exhibits no maxillary sinus tenderness and no frontal sinus tenderness. Left sinus exhibits no maxillary sinus tenderness and no frontal sinus tenderness.  Mouth/Throat: Oropharynx is clear and moist and mucous membranes are normal.  Hyperemic nasal mucosa,no lesions or site of bleeding appreciated. Edentulous.  Eyes: Pupils are equal, round, and reactive to light. Conjunctivae are normal.  Cardiovascular: Normal rate and regular rhythm.  No murmur heard. Pulses:      Dorsalis pedis pulses are 2+ on the right side and 2+ on the left side.  Respiratory: Effort  normal and breath sounds normal. No respiratory distress.  GI: Soft. She exhibits no mass. There is no hepatomegaly. There is abdominal tenderness (along scar RUQ).    Musculoskeletal:        General: No edema.  Lymphadenopathy:    She has no cervical adenopathy.  Neurological: She is alert and oriented to person, place, and time. She has normal strength. Gait abnormal.  Unstable gait assisted with a walker.  Skin: Skin is warm. No rash noted. No erythema.  Psychiatric: Her mood appears anxious.  Well groomed, good eye contact.      ASSESSMENT AND PLAN:   Ms. Janith was seen today for nose bleeds.  Diagnoses and all orders for this visit:  Lab Results  Component Value Date   WBC 8.9 07/21/2018   HGB 12.2 07/21/2018   HCT 37.1 07/21/2018   MCV 74.6 (L) 07/21/2018   PLT 229.0 07/21/2018    Epistaxis We discussed possible etiologies. It could be related to rhinitis. On exam no lesions appreciated. Recommend avoiding blowing nose. Avoid Afrin. Instructed about warning signs. If recurrent ENT evaluation may be necessary.  -     CBC  Gastroesophageal reflux disease, esophagitis presence not specified Not well controlled. GERD precautions discussed. Pepcid 20 mg bid added. No changes in Protonix. We may need GI consultation for EGD if not improvement.  Keep next f/u appt.  -     famotidine (PEPCID) 20 MG tablet; Take 1 tablet (20 mg total) by mouth 2 (two) times daily.  Allergic rhinitis, unspecified seasonality, unspecified trigger Nasal irrigations with saline several times per day recommended. Intranasal steroid may aggravate nose bleed. No changes in Singulair.  Abdominal pain, RUQ Reported as chronic and stable. ? Adhesions  Abdominal CT in 10/2013 because RLQ abdomina pain, no acute findings. I do not think imaging is needed at this time. Keep f/u appt.      Return for Keep next appointment..      Trenae Brunke G. [name], MD  [facility]. [place] office.

## 2018-08-07 ENCOUNTER — Telehealth: Payer: Self-pay | Admitting: *Deleted

## 2018-08-07 NOTE — Telephone Encounter (Signed)
Copied from [place] [id number]. Topic: General - Inquiry >> Aug 07, 2018 11:38 AM Margot Ables wrote: Reason for CRM: Pt states that she is needing an FL2 to be filled out again. She said it wasn't correct. She is requesting call back from Dr. Doug Sou CMA/RN to explain and declined additional information at this time.

## 2018-08-08 NOTE — Telephone Encounter (Signed)
Patient stated that she needed a new FL2 filled out because the caseworker stated that they could not read it. Form need to be sent to Drenda Freeze at [phone].

## 2018-08-09 ENCOUNTER — Other Ambulatory Visit: Payer: Self-pay | Admitting: *Deleted

## 2018-08-09 NOTE — Telephone Encounter (Signed)
FL2 can be provided as requested. Thanks, BJ

## 2018-08-11 ENCOUNTER — Ambulatory Visit (INDEPENDENT_AMBULATORY_CARE_PROVIDER_SITE_OTHER): Payer: Medicare Other | Admitting: Family Medicine

## 2018-08-11 ENCOUNTER — Encounter: Payer: Self-pay | Admitting: Family Medicine

## 2018-08-11 VITALS — BP 122/60 | HR 88 | Temp 98.5°F | Resp 16 | Ht 63.0 in | Wt 271.2 lb

## 2018-08-11 DIAGNOSIS — L669 Cicatricial alopecia, unspecified: Secondary | ICD-10-CM

## 2018-08-11 DIAGNOSIS — I110 Hypertensive heart disease with heart failure: Secondary | ICD-10-CM | POA: Diagnosis not present

## 2018-08-11 DIAGNOSIS — E1140 Type 2 diabetes mellitus with diabetic neuropathy, unspecified: Secondary | ICD-10-CM

## 2018-08-11 DIAGNOSIS — J449 Chronic obstructive pulmonary disease, unspecified: Secondary | ICD-10-CM | POA: Diagnosis not present

## 2018-08-11 DIAGNOSIS — R109 Unspecified abdominal pain: Secondary | ICD-10-CM

## 2018-08-11 LAB — POCT GLYCOSYLATED HEMOGLOBIN (HGB A1C): Hemoglobin A1C: 6.6 % — AB (ref 4.0–5.6)

## 2018-08-11 MED ORDER — FLUTICASONE PROPIONATE HFA 110 MCG/ACT IN AERO: 2.0000 | INHALATION_SPRAY | Freq: Two times a day (BID) | RESPIRATORY_TRACT | 3 refills | Status: DC

## 2018-08-11 MED ORDER — LIDOCAINE 4 % EX CREA: 1.0000 "application " | TOPICAL_CREAM | Freq: Three times a day (TID) | CUTANEOUS | 1 refills | Status: DC | PRN

## 2018-08-11 NOTE — Progress Notes (Signed)
HPI:   Brittany Roberts is a 78 y.o. female, who is here today with her daughter for chronic disease management.   She was last seen on 07/21/2018, when she was complaining about epistaxis. She started doing nasal irrigation with saline and has not had any nosebleed since her last visit.  GERD: Last visit she was complaining of symptoms despite of being on Protonix 40 mg daily. Pepcid 20 mg twice daily was added, she did not receive prescription at her pharmacy. She stopped a cough syrup she was taking at that time,  Right-sided abdomen burning sensation has not changed since her last visit. Complaining of moderate to severe burning sensation of abdominal wall on right side, which she thinks started after cholecystectomy. Problem has been stable for years. Sometimes it keeps him from sleep. He seems to be alleviated by massaging area, she has not identified exacerbating factors. Pain is not associated with nausea, vomiting, or urinary symptoms. No changes in bowel habits, blood in the stool, or melena.  Hypertension: Currently she is on metoprolol tartrate 100 mg twice daily and Norvasc 10 mg daily. She is not checking BP at home.  CKD 3, poor with nephrologist. CHF, she follows with cardiologist, Dr. Einar Gip.   Lab Results  Component Value Date   CREATININE 1.47 (H) 12/15/2017   BUN 16 12/15/2017   NA 143 12/15/2017   K 3.6 12/15/2017   CL 100 (L) 12/15/2017   CO2 31 12/15/2017   COPD/asthma: She is currently on Flovent 110 mcg twice daily and albuterol inhaler to use as needed. She is reporting symptoms as well controlled. She denies worsening cough, dyspnea, or wheezing.  She is also requesting referral to dermatologist due to hair loss. Areas on her scalp with pigmentation changes and alopecia. Problem has been going on for a while now but she feels like it is getting worse. She wonders if topical vitamins will help.  She is requesting another FL-2 to be  completed.  The one I already completed is not readable because she added information,wrote on top of what was already documented. She lives alone, her daughter here does not think she should move from her house. Brittany Roberts is pretty sure she wants to move to a assisted facility, she has several limitations with ADLs and thinks she would benefit of having people with who she can interact. Her daughters live close by but they do not visit frequently. Brittany Roberts states that she is "afraid" or staying by herself.  She has had home health aides but according to patient, some took things from her house.  So she is not interested in having this service anymore.    12/2017 echo showed LVEF 45-50%Akinesis of the inferior myocardium. Features are consistent with a pseudonormal left ventricular filling pattern, with concomitant abnormal relaxation and increased filling pressure (grade 2 diastolic dysfunction   Review of Systems  Constitutional: Positive for fatigue. Negative for activity change, appetite change and fever.  HENT: Negative for mouth sores, nosebleeds and trouble swallowing.   Eyes: Negative for redness and visual disturbance.  Respiratory: Negative for cough, shortness of breath and wheezing.   Cardiovascular: Negative for chest pain, palpitations and leg swelling.  Gastrointestinal: Negative for abdominal pain, nausea and vomiting.       Negative for changes in bowel habits.  Endocrine: Negative for polydipsia, polyphagia and polyuria.  Genitourinary: Negative for decreased urine volume, dysuria and hematuria.  Musculoskeletal: Positive for arthralgias, gait problem and myalgias.  Negative for joint swelling.  Allergic/Immunologic: Positive for environmental allergies.  Neurological: Negative for seizures, syncope, weakness, numbness and headaches.  Psychiatric/Behavioral: Negative for confusion. The patient is nervous/anxious.     Current Outpatient Medications on File Prior to Visit    Medication Sig Dispense Refill  . ACCU-CHEK SOFTCLIX LANCETS lancets Use as instructed 100 each 3  . acetaminophen (TYLENOL) 325 MG tablet Take 650 mg by mouth every 6 (six) hours as needed for mild pain or moderate pain.    Marland Kitchen albuterol (PROAIR HFA) 108 (90 Base) MCG/ACT inhaler inhale 2 puffs by mouth every 4 hours if needed USE ONLY IF YOU ARE WHEEZING 1 Inhaler 0  . allopurinol (ZYLOPRIM) 100 MG tablet Take 1 tablet (100 mg total) by mouth daily. 90 tablet 3  . amLODipine (NORVASC) 10 MG tablet Take 10 mg by mouth daily.    . Ascorbic Acid (VITAMIN C PO) Take 1 tablet by mouth daily.    Marland Kitchen aspirin EC 81 MG tablet Take 81 mg by mouth daily.     Marland Kitchen atorvastatin (LIPITOR) 20 MG tablet TAKE 1 TABLET BY MOUTH DAILY 30 tablet 0  . B Complex Vitamins (VITAMIN B COMPLEX) TABS Take 1 tablet by mouth daily.    . Blood Glucose Monitoring Suppl (ACCU-CHEK AVIVA) device Use to test blood sugar three times a day. Dx. E11.9 1 each 1  . Cholecalciferol (VITAMIN D3) 5000 UNITS CAPS Take 5,000 Units by mouth daily.    . diclofenac sodium (VOLTAREN) 1 % GEL Apply 4 g topically 4 (four) times daily. 4 Tube 3  . ferrous sulfate 325 (65 FE) MG tablet Take 325 mg by mouth daily with breakfast.    . furosemide (LASIX) 40 MG tablet Take 1 tablet (40 mg total) by mouth 2 (two) times daily. 180 tablet 2  . gabapentin (NEURONTIN) 300 MG capsule TAKE 1 CAPSULE BY MOUTH TWICE DAILY 60 capsule 3  . Insulin NPH, Human,, Isophane, (HUMULIN N KWIKPEN) 100 UNIT/ML Kiwkpen INJECT 30 UNITS SUBCUTANEOUSLY EVERY MORNING 30 mL 3  . Insulin Pen Needle (B-D ULTRAFINE III SHORT PEN) 31G X 8 MM MISC CHECK BLOOD SUGAR 3 TIMES A DAY 100 each 3  . levETIRAcetam (KEPPRA) 500 MG tablet Take 1 tablet (500 mg total) by mouth 2 (two) times daily. 180 tablet 3  . metoprolol tartrate (LOPRESSOR) 50 MG tablet TAKE 1 TABLET BY MOUTH EVERY DAY WITH OR IMME DIATELY FOLLOWING A MEAL FOR HYPERTENSION 90 tablet 0  . montelukast (SINGULAIR) 10 MG tablet  Take 1 tablet (10 mg total) by mouth at bedtime. 30 tablet 0  . Multiple Vitamin (MULTIVITAMIN WITH MINERALS) TABS tablet Take 1 tablet by mouth daily.    . nitroGLYCERIN (NITROSTAT) 0.4 MG SL tablet Place 1 tablet (0.4 mg total) under the tongue every 5 (five) minutes as needed for chest pain. 20 tablet 12  . Olopatadine HCl (PAZEO) 0.7 % SOLN Apply 1 drop to eye daily. 1 Bottle 2  . pantoprazole (PROTONIX) 40 MG tablet Take 1 tablet (40 mg total) by mouth daily. 90 tablet 3  . Polyethyl Glycol-Propyl Glycol (SYSTANE ULTRA OP) Place 1 drop into both eyes every 6 (six) hours as needed (dry eyes).    . potassium chloride (K-DUR,KLOR-CON) 10 MEQ tablet Take 1 tablet by mouth 2 (two) times daily.  2  . guaiFENesin (ROBITUSSIN) 100 MG/5ML liquid Take 200 mg by mouth 3 (three) times daily as needed for cough.     No current facility-administered medications on file  prior to visit.      Past Medical History:  Diagnosis Date  . Acute delirium 01/14/2017  . Anxiety   . Arthritis    "all over"  . Asthma   . Bipolar disorder ([place])   . Complication of anesthesia    "w/right foot OR they gave me too much and they couldn't get me woke"  . Congestive heart failure, unspecified   . Coronary artery calcification seen on CAT scan 01/14/2017  . Coronary artery disease    "I've got 1 stent" (06/30/2015)  . Depression   . Discoid lupus   . Fibromyalgia    "I've been told I have this; can't take Lyrica cause I'm allergic to it" (06/30/2015)  . GERD (gastroesophageal reflux disease)   . History of gout   . History of hiatal hernia    "real bad" (06/30/2015)  . Hypertension   . Hypothyroidism   . On home oxygen therapy    "2L q hs" (06/30/2015)  . OSA (obstructive sleep apnea)    "just use my oxygen; no mask" (06/30/2015)  . Other and unspecified hyperlipidemia   . Penetrating foot wound    left nonhealing foot wound on the dorsal surface  . Peripheral neuropathy   . Pneumonia "many of times"  .  Refusal of blood transfusions as patient is Jehovah's Witness   . Renal failure, unspecified    "kidneys not working 100%" (06/30/2015)  . Sickle cell trait ([place])   . Systemic lupus ([place])   . Thoracic aortic atherosclerosis ([place]) 01/14/2017  . Type II diabetes mellitus ([place])   . Unspecified vitamin D deficiency    Allergies  Allergen Reactions  . Ace Inhibitors Other (See Comments)    Coincided with sig bump in creat. Retried and creat bumped again. Coincided with sig bump in creat. Retried and creat bumped again. Coincided with sig bump in creat. Retried and creat bumped again. Other reaction(s): Other (See Comments) Coincided with sig bump in creat. Retried and creat bumped again. Coincided with sig bump in creat. Retried and creat bumped again.  . Isosorb Dinitrate-Hydralazine Other (See Comments)    Sleep all the time. Sleep all the time. Other reaction(s): Other (See Comments) Sleep all the time.  Marland Kitchen Penicillins Anaphylaxis, Swelling and Rash    Has patient had a PCN reaction causing immediate rash, facial/tongue/throat swelling, SOB or lightheadedness with hypotension: Yes Has patient had a PCN reaction causing severe rash involving mucus membranes or skin necrosis: Yes Has patient had a PCN reaction that required hospitalization: Yes Has patient had a PCN reaction occurring within the last 10 years: No If all of the above answers are "NO", then may proceed with Cephalosporin use.   Marland Kitchen Buspirone Other (See Comments)    pain pain pain Other reaction(s): Other (See Comments) pain pain  . Pregabalin Swelling  . Ropinirole Hydrochloride Swelling  . Amantadines Rash    "Swelling of the tongue"    Social History   Socioeconomic History  . Marital status: Single    Spouse name: Not on file  . Number of children: 4  . Years of education: 17 th  . Highest education level: Not on file  Occupational History  . Occupation: Retired- Engineer, production    Employer:  RETIRED  Social Needs  . Financial resource strain: Not on file  . Food insecurity:    Worry: Not on file    Inability: Not on file  . Transportation needs:    Medical:  Not on file    Non-medical: Not on file  Tobacco Use  . Smoking status: Former Smoker    Packs/day: 3.00    Years: 40.00    Pack years: 120.00    Types: Cigarettes    Start date: 07/05/1960    Last attempt to quit: 07/05/1998    Years since quitting: 20.1  . Smokeless tobacco: Never Used  Substance and Sexual Activity  . Alcohol use: No    Alcohol/week: 0.0 standard drinks    Comment: 06/30/2015 "used to drink a little alcohol on the weekends; not q weekend; nothing in years"  . Drug use: No  . Sexual activity: Never  Lifestyle  . Physical activity:    Days per week: Not on file    Minutes per session: Not on file  . Stress: Not on file  Relationships  . Social connections:    Talks on phone: Not on file    Gets together: Not on file    Attends religious service: Not on file    Active member of club or organization: Not on file    Attends meetings of clubs or organizations: Not on file    Relationship status: Not on file  Other Topics Concern  . Not on file  Social History Narrative   Health Care POA:    Emergency Contact: daughter, Lorenso Courier [phone] or [name] [phone]   End of Life Plan: Does not want to be ventilated or feeding tubes.    Who lives with you: Lives alone   Any pets: none   Diet: Patient reports enjoying and eating junk food.  Does not regulate types of food and currently her dentures are broken so it is hard to eat some foods.   Exercise: Patient does not have an exercise plan.   Seatbelts: Patient reports wearing seatbelt when in vehicle.   Sun Exposure/Protection: Patient reports not going outside very often.   Hobbies: Patient enjoys reading the bible and watching game shows.    Right-handed.   1 cup caffeine per day.   Former smoker-stopped 2000   Alcohol none     Vitals:   08/11/18 1120  BP: 122/60  Pulse: 88  Resp: 16  Temp: 98.5 F (36.9 C)  SpO2: 95%   Body mass index is 48.05 kg/m.   Physical Exam  Nursing note and vitals reviewed. Constitutional: She is oriented to person, place, and time. She appears well-developed. No distress.  HENT:  Head: Normocephalic and atraumatic.  Mouth/Throat: Oropharynx is clear and moist and mucous membranes are normal.  Edentulous.  Eyes: Pupils are equal, round, and reactive to light. Conjunctivae are normal.  Cardiovascular: Normal rate and regular rhythm.  No murmur heard. Respiratory: Effort normal and breath sounds normal. No respiratory distress.  GI: Soft. She exhibits no mass. There is no abdominal tenderness.  No tenderness upon deep palpation but upon light touch of skin ,right side of abdomen.  Musculoskeletal:        General: No edema.  Lymphadenopathy:    She has no cervical adenopathy.  Neurological: She is alert and oriented to person, place, and time. She has normal strength. No cranial nerve deficit. Gait abnormal.  Unstable gait assisted with a walker.  Skin: Skin is warm and dry. No rash noted. No erythema.  Alopecic areas on his scalp, mainly left parietal area. Scattered hypopigmented skin on his scalp.  Psychiatric: Her mood appears anxious.  Fairly groomed, good eye contact.     ASSESSMENT AND PLAN:  Brittany Roberts was seen today for chronic problem management.   Orders Placed This Encounter  Procedures  . Ambulatory referral to Dermatology  . POCT glycosylated hemoglobin (Hb A1C)   Lab Results  Component Value Date   HGBA1C 6.6 (A) 08/11/2018    Hypertensive heart disease with heart failure (HCC) BP is adequately controlled. No changes in current management. Stressed the importance of following low-salt diet. Follow-up in 5 to 6 months, before if needed.  COPD (chronic obstructive pulmonary disease) (HCC) Problem is well controlled with Flovent 110  mcg 2 puff twice daily and albuterol inhaler as needed. No changes in current management.   Controlled type 2 diabetes with neuropathy (HCC) HgA1C at goal. No changes in current management. Regular physical activity as tolerated and healthy diet with avoidance of added sugar food intake is an important part of treatment and recommended. Annual eye exam, periodic dental and foot care recommended. F/U in 5-6 months   Alopecia, scarring Remote history of lupus, which could explain problem. We discussed other possible etiologies. I do not see active lesions on scalp. Dermatology referral placed as requested.   Abdominal wall pain in right flank Chronic and stable. ? Radicular pain, fibromyalgia. Topical Lidocaine may help. Instructed about warning signs.   40 min face to face OV. > 50% was dedicated to discussion of Dx, prognosis, treatment options, and some side effects of medications. She called me back to the room, she is concerned about CHF.  According to patient, she was told by her cardiologist that her heart is "giving." We reviewed last echo and educated warning signs. Continue following with Dr Einar Gip.   Return in about 6 months (around 02/09/2019) for HTN,DM III.    [name]. [name], MD  [facility] [place] office.

## 2018-08-11 NOTE — Patient Instructions (Signed)
A few things to remember from today's visit:   Hypertensive heart disease with heart failure ([place])  Controlled type 2 diabetes with neuropathy ([place]) - Plan: POCT glycosylated hemoglobin (Hb A1C)  Chronic obstructive pulmonary disease, unspecified COPD type ([place])   Please be sure medication list is accurate. If a new problem present, please set up appointment sooner than planned today.

## 2018-08-11 NOTE — Assessment & Plan Note (Addendum)
Remote history of lupus, which could explain problem. We discussed other possible etiologies. I do not see active lesions on scalp. Dermatology referral placed as requested.

## 2018-08-11 NOTE — Assessment & Plan Note (Signed)
HgA1C at goal. No changes in current management. Regular physical activity as tolerated and healthy diet with avoidance of added sugar food intake is an important part of treatment and recommended. Annual eye exam, periodic dental and foot care recommended. F/U in 5-6 months

## 2018-08-11 NOTE — Telephone Encounter (Signed)
FL2 completed, patient is coming into office for a follow-up, will give form to patient then.

## 2018-08-11 NOTE — Assessment & Plan Note (Signed)
BP is adequately controlled. No changes in current management. Stressed the importance of following low-salt diet. Follow-up in 5 to 6 months, before if needed.

## 2018-08-11 NOTE — Assessment & Plan Note (Signed)
Problem is well controlled with Flovent 110 mcg 2 puff twice daily and albuterol inhaler as needed. No changes in current management.

## 2018-08-13 ENCOUNTER — Encounter: Payer: Self-pay | Admitting: Family Medicine

## 2018-08-13 DIAGNOSIS — R109 Unspecified abdominal pain: Secondary | ICD-10-CM | POA: Insufficient documentation

## 2018-08-14 ENCOUNTER — Other Ambulatory Visit: Payer: Self-pay | Admitting: Family Medicine

## 2018-08-14 ENCOUNTER — Other Ambulatory Visit: Payer: Self-pay | Admitting: Cardiology

## 2018-08-15 ENCOUNTER — Other Ambulatory Visit: Payer: Self-pay

## 2018-08-15 MED ORDER — POTASSIUM CHLORIDE ER 10 MEQ PO TBCR: 10.0000 meq | EXTENDED_RELEASE_TABLET | Freq: Two times a day (BID) | ORAL | 1 refills | Status: DC

## 2018-08-17 ENCOUNTER — Telehealth: Payer: Self-pay | Admitting: Family Medicine

## 2018-08-17 NOTE — Telephone Encounter (Signed)
Pts daughter Mallie Snooks) dropped off a FL-2 form and need for section #11 to be completed.  Upon completion [name] would like to be called [phone]) to pick the form up.  Form placed in providers folder for completion

## 2018-08-18 NOTE — Telephone Encounter (Signed)
Form placed on providers desk for completion

## 2018-08-18 NOTE — Telephone Encounter (Signed)
Form completed and left detailed message for [name] informing that form is ready for pick-up.

## 2018-08-25 DIAGNOSIS — M65872 Other synovitis and tenosynovitis, left ankle and foot: Secondary | ICD-10-CM | POA: Diagnosis not present

## 2018-08-25 DIAGNOSIS — M65871 Other synovitis and tenosynovitis, right ankle and foot: Secondary | ICD-10-CM | POA: Diagnosis not present

## 2018-08-25 DIAGNOSIS — M12271 Villonodular synovitis (pigmented), right ankle and foot: Secondary | ICD-10-CM | POA: Diagnosis not present

## 2018-08-25 DIAGNOSIS — M12272 Villonodular synovitis (pigmented), left ankle and foot: Secondary | ICD-10-CM | POA: Diagnosis not present

## 2018-08-25 DIAGNOSIS — M25571 Pain in right ankle and joints of right foot: Secondary | ICD-10-CM | POA: Diagnosis not present

## 2018-09-05 ENCOUNTER — Ambulatory Visit: Payer: Self-pay | Admitting: *Deleted

## 2018-09-05 NOTE — Telephone Encounter (Signed)
Spoke with [name] and gave directions to give patient 10 units of insulin at bedtime per Dr. [name]. [name] verbalized understanding and stated that she was going to stop Prednisone due to the side effects.

## 2018-09-05 NOTE — Telephone Encounter (Signed)
Please advise 

## 2018-09-05 NOTE — Telephone Encounter (Signed)
Message sent to Dr. Jordan for review. Please advise 

## 2018-09-05 NOTE — Telephone Encounter (Signed)
  Pt's daughter called with her mom having a blood sugar this morning of 481 and within the last 30 mins was 463. She denies nausea, vomiting, fever, dizziness or weakness. Only c/o is slight headache. Advised to hydrate, drink water. Called place to [facility] at [place] per protocol.  Will route triage to office for further recommendations. Daughter voiced understanding and to call back if her mom having nausea, vomiting, dizziness, weakness or any other symptoms and take her to the ED.  Reason for Disposition . [1] Blood glucose > 300 mg/dL (16.7 mmol/L) AND [2] two or more times in a row  Answer Assessment - Initial Assessment Questions 1. BLOOD GLUCOSE: "What is your blood glucose level?"      463 2. ONSET: "When did you check the blood glucose?"     now 3. USUAL RANGE: "What is your glucose level usually?" (e.g., usual fasting morning value, usual evening value)     120-180 4. KETONES: "Do you check for ketones (urine or blood test strips)?" If yes, ask: "What does the test show now?"      no 5. TYPE 1 or 2:  "Do you know what type of diabetes you have?"  (e.g., Type 1, Type 2, Gestational; doesn't know)      Type 2 6. INSULIN: "Do you take insulin?" "What type of insulin(s) do you use? What is the mode of delivery? (syringe, pen (e.g., injection or  pump)?"      On insulin uses a pen, Humalin N 7. DIABETES PILLS: "Do you take any pills for your diabetes?" If yes, ask: "Have you missed taking any medications today?     NO  8. OTHER SYMPTOMS: "Do you have any symptoms?" (e.g., fever, frequent urination, difficulty breathing, dizziness, weakness, vomiting)     Slight headache 9. PREGNANCY: "Is there any chance you are pregnant?" "When was your last menstrual period?"     N/a  Protocols used: DIABETES - HIGH BLOOD SUGAR-A-AH

## 2018-09-12 DIAGNOSIS — J45909 Unspecified asthma, uncomplicated: Secondary | ICD-10-CM | POA: Diagnosis not present

## 2018-09-12 DIAGNOSIS — I10 Essential (primary) hypertension: Secondary | ICD-10-CM | POA: Diagnosis not present

## 2018-09-12 DIAGNOSIS — M25869 Other specified joint disorders, unspecified knee: Secondary | ICD-10-CM | POA: Diagnosis not present

## 2018-09-20 ENCOUNTER — Telehealth: Payer: Self-pay | Admitting: Internal Medicine

## 2018-09-20 NOTE — Telephone Encounter (Signed)
A new patient appt has been scheduled for the pt to see Dr. Mickeal Skinner on 3/24 at 1120am. Aware to arrive at 11am to be checked in on time.

## 2018-09-26 ENCOUNTER — Inpatient Hospital Stay: Payer: Medicare Other | Admitting: Internal Medicine

## 2018-10-13 DIAGNOSIS — J45909 Unspecified asthma, uncomplicated: Secondary | ICD-10-CM | POA: Diagnosis not present

## 2018-10-13 DIAGNOSIS — M25869 Other specified joint disorders, unspecified knee: Secondary | ICD-10-CM | POA: Diagnosis not present

## 2018-10-13 DIAGNOSIS — I10 Essential (primary) hypertension: Secondary | ICD-10-CM | POA: Diagnosis not present

## 2018-10-20 ENCOUNTER — Telehealth: Payer: Self-pay | Admitting: *Deleted

## 2018-10-20 NOTE — Telephone Encounter (Signed)
Copied from [place] [phone]. Topic: Appointment Scheduling - Scheduling Inquiry for Clinic >> Oct 20, 2018 11:57 AM Rainey Pines A wrote: Reason for CRM: Patient wants to schedule a virtual visit in regards to getting an order from Dr. [name] for a scooter. Once patient is seen, she stated that she will need the order for the scooter sent to [facility]. Phone number [phone] Fax Number [phone]

## 2018-10-20 NOTE — Telephone Encounter (Signed)
Spoke with patient and she stated that she doesn't have access to a smart phone, computer or tablet. Patient stated that she would get with her daughter to set a time that she could do the virtual visit with her and have text message sent to the daughter's cell phone. Patient will call back to schedule virtual visit.

## 2018-11-12 DIAGNOSIS — I10 Essential (primary) hypertension: Secondary | ICD-10-CM | POA: Diagnosis not present

## 2018-11-12 DIAGNOSIS — M25869 Other specified joint disorders, unspecified knee: Secondary | ICD-10-CM | POA: Diagnosis not present

## 2018-11-12 DIAGNOSIS — J45909 Unspecified asthma, uncomplicated: Secondary | ICD-10-CM | POA: Diagnosis not present

## 2018-11-13 ENCOUNTER — Other Ambulatory Visit: Payer: Self-pay | Admitting: Family Medicine

## 2018-11-14 ENCOUNTER — Other Ambulatory Visit: Payer: Self-pay

## 2018-11-14 DIAGNOSIS — I110 Hypertensive heart disease with heart failure: Secondary | ICD-10-CM

## 2018-11-14 MED ORDER — AMLODIPINE BESYLATE 10 MG PO TABS: 10.0000 mg | ORAL_TABLET | Freq: Every day | ORAL | 3 refills | Status: DC

## 2018-11-24 ENCOUNTER — Telehealth: Payer: Self-pay

## 2018-11-24 NOTE — Telephone Encounter (Signed)
Patient would like to have her appointment moved out for awhile. Doesn't feel comfortable coming in.

## 2018-11-28 ENCOUNTER — Ambulatory Visit: Payer: Medicare Other | Admitting: Internal Medicine

## 2018-11-30 ENCOUNTER — Telehealth: Payer: Self-pay | Admitting: *Deleted

## 2018-11-30 NOTE — Telephone Encounter (Signed)
Returned call to speak with [name], she stated that she couldn't talk right now, call her back tomorrow around 9 am at [phone].  Copied from [place] [phone]. Topic: General - Other >> Nov 30, 2018 11:36 AM Carolyn Stare wrote:  Pt daughter [name] call to say pt told her that she needed to speak with Dr [name] nurse concerning a scooter, would like a call back

## 2018-12-01 ENCOUNTER — Telehealth: Payer: Self-pay

## 2018-12-01 NOTE — Telephone Encounter (Signed)
Rx for semi-electric bed written and signed to be faxed. Thanks, BJ

## 2018-12-01 NOTE — Telephone Encounter (Signed)
Message sent to Dr. Jordan for review and approval. 

## 2018-12-01 NOTE — Telephone Encounter (Signed)
Copied from [place] [phone]. Topic: General - Other >> Dec 01, 2018 11:37 AM Sheran Luz wrote: Hollie Salk, with [facility] member services, calling on behalf of patient to request orders for semi-electric hospital bed faxed to [facility].   [facility]# [phone] fax # [phone]

## 2018-12-01 NOTE — Telephone Encounter (Signed)
Order faxed to [facility] as requested.

## 2018-12-13 DIAGNOSIS — I10 Essential (primary) hypertension: Secondary | ICD-10-CM | POA: Diagnosis not present

## 2018-12-13 DIAGNOSIS — M25869 Other specified joint disorders, unspecified knee: Secondary | ICD-10-CM | POA: Diagnosis not present

## 2018-12-13 DIAGNOSIS — J45909 Unspecified asthma, uncomplicated: Secondary | ICD-10-CM | POA: Diagnosis not present

## 2018-12-18 ENCOUNTER — Other Ambulatory Visit: Payer: Self-pay | Admitting: Family Medicine

## 2019-01-01 ENCOUNTER — Telehealth (INDEPENDENT_AMBULATORY_CARE_PROVIDER_SITE_OTHER): Payer: Medicare Other | Admitting: Neurology

## 2019-01-01 ENCOUNTER — Other Ambulatory Visit: Payer: Self-pay

## 2019-01-01 ENCOUNTER — Encounter: Payer: Self-pay | Admitting: Neurology

## 2019-01-01 VITALS — Ht 63.5 in | Wt 276.0 lb

## 2019-01-01 DIAGNOSIS — F03A0 Unspecified dementia, mild, without behavioral disturbance, psychotic disturbance, mood disturbance, and anxiety: Secondary | ICD-10-CM

## 2019-01-01 DIAGNOSIS — G40209 Localization-related (focal) (partial) symptomatic epilepsy and epileptic syndromes with complex partial seizures, not intractable, without status epilepticus: Secondary | ICD-10-CM

## 2019-01-01 DIAGNOSIS — I10 Essential (primary) hypertension: Secondary | ICD-10-CM | POA: Insufficient documentation

## 2019-01-01 DIAGNOSIS — M810 Age-related osteoporosis without current pathological fracture: Secondary | ICD-10-CM | POA: Insufficient documentation

## 2019-01-01 DIAGNOSIS — F0390 Unspecified dementia without behavioral disturbance: Secondary | ICD-10-CM

## 2019-01-01 MED ORDER — LEVETIRACETAM 500 MG PO TABS: 500.0000 mg | ORAL_TABLET | Freq: Two times a day (BID) | ORAL | 3 refills | Status: DC
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
## 2019-01-03 ENCOUNTER — Encounter

## 2019-01-03 ENCOUNTER — Ambulatory Visit: Payer: Self-pay | Admitting: Neurology

## 2019-01-12 DIAGNOSIS — M25869 Other specified joint disorders, unspecified knee: Secondary | ICD-10-CM | POA: Diagnosis not present

## 2019-01-12 DIAGNOSIS — I10 Essential (primary) hypertension: Secondary | ICD-10-CM | POA: Diagnosis not present

## 2019-01-12 DIAGNOSIS — J45909 Unspecified asthma, uncomplicated: Secondary | ICD-10-CM | POA: Diagnosis not present

## 2019-01-15 ENCOUNTER — Other Ambulatory Visit: Payer: Self-pay | Admitting: Cardiology

## 2019-01-17 ENCOUNTER — Other Ambulatory Visit: Payer: Self-pay

## 2019-01-17 ENCOUNTER — Encounter: Payer: Self-pay | Admitting: Cardiology

## 2019-01-17 ENCOUNTER — Ambulatory Visit (INDEPENDENT_AMBULATORY_CARE_PROVIDER_SITE_OTHER): Payer: Medicare Other | Admitting: Cardiology

## 2019-01-17 VITALS — BP 126/84

## 2019-01-17 DIAGNOSIS — I25118 Atherosclerotic heart disease of native coronary artery with other forms of angina pectoris: Secondary | ICD-10-CM

## 2019-01-17 DIAGNOSIS — I5032 Chronic diastolic (congestive) heart failure: Secondary | ICD-10-CM | POA: Insufficient documentation

## 2019-01-17 NOTE — Progress Notes (Signed)
Subjective:   Brittany Roberts, female    DOB: 1940-07-30, 78 y.o.   MRN: [id number]  I connected with the patient on 01/17/2019 by a telephone call and verified that I am speaking with the correct person using two identifiers.     I offered the patient a video enabled application for a virtual visit. Unfortunately, this could not be accomplished due to technical difficulties/lack of video enabled phone/computer. I discussed the limitations of evaluation and management by telemedicine and the availability of in person appointments. The patient expressed understanding and agreed to proceed.   This visit type was conducted due to national recommendations for restrictions regarding the COVID-19 Pandemic (e.g. social distancing).  This format is felt to be most appropriate for this patient at this time.  All issues noted in this document were discussed and addressed.  No physical exam was performed (except for noted visual exam findings with Tele health visits).  The patient has consented to conduct a Tele health visit and understands insurance will be billed.    Chief complaint:  Chest pain  HPI   78 year old African-American female with nonischemic cardiomyopathy, CAD (PCI RCA), obesity, hypertension, type II diabetes mellitus, morbid obesity, peripheral neuropathy.  Patient reports episodes of chest pain several times in the past few weeks. Episodes typically occur at rest, especially ay night. They are associated with diaphoresis. Chest pain improved with SL NTG. She denies chest pain while walking.    Past Medical History:  Diagnosis Date  . Acute delirium 01/14/2017  . Anxiety   . Arthritis    "all over"  . Asthma   . Bipolar disorder ([place])   . Complication of anesthesia    "w/right foot OR they gave me too much and they couldn't get me woke"  . Congestive heart failure, unspecified   . Coronary artery calcification seen on CAT scan 01/14/2017  . Coronary artery disease    "I've got  1 stent" (06/30/2015)  . Depression   . Discoid lupus   . Fibromyalgia    "I've been told I have this; can't take Lyrica cause I'm allergic to it" (06/30/2015)  . GERD (gastroesophageal reflux disease)   . History of gout   . History of hiatal hernia    "real bad" (06/30/2015)  . Hypertension   . Hypothyroidism   . On home oxygen therapy    "2L q hs" (06/30/2015)  . OSA (obstructive sleep apnea)    "just use my oxygen; no mask" (06/30/2015)  . Other and unspecified hyperlipidemia   . Penetrating foot wound    left nonhealing foot wound on the dorsal surface  . Peripheral neuropathy   . Pneumonia "many of times"  . Refusal of blood transfusions as patient is Jehovah's Witness   . Renal failure, unspecified    "kidneys not working 100%" (06/30/2015)  . Sickle cell trait ([place])   . Systemic lupus ([place])   . Thoracic aortic atherosclerosis ([place]) 01/14/2017  . Type II diabetes mellitus ([place])   . Unspecified vitamin D deficiency      Past Surgical History:  Procedure Laterality Date  . ANKLE ARTHROSCOPY WITH OPEN REDUCTION INTERNAL FIXATION (ORIF) Right 03/2011  . CARDIAC CATHETERIZATION N/A 07/01/2015   Procedure: Left Heart Cath and Coronary Angiography;  Surgeon: Adrian Prows, MD;  Location: [facility];  Service: Cardiovascular;  Laterality: N/A;  . CARDIAC CATHETERIZATION N/A 07/01/2015   Procedure: Intravascular Pressure Wire/FFR Study;  Surgeon: Adrian Prows, MD;  Location: [facility]  INVASIVE CV LAB;  Service: Cardiovascular;  Laterality: N/A;  . CARPAL TUNNEL RELEASE Bilateral   . CATARACT EXTRACTION W/ INTRAOCULAR LENS  IMPLANT, BILATERAL Bilateral   . CHOLECYSTECTOMY OPEN    . COLONOSCOPY N/A 12/10/2013   Procedure: COLONOSCOPY;  Surgeon: Inda Castle, MD;  Location: [facility];  Service: Endoscopy;  Laterality: N/A;  . FRACTURE SURGERY    . INCISION AND DRAINAGE ABSCESS Left    foot "under my little toe"  . KNEE LIGAMENT RECONSTRUCTION Right   . LEFT HEART CATHETERIZATION  WITH CORONARY ANGIOGRAM N/A 08/08/2012   Procedure: LEFT HEART CATHETERIZATION WITH CORONARY ANGIOGRAM;  Surgeon: [name], MD;  Location[facility] CATH LAB;  Service: Cardiovascular;  Laterality: N/A;  . NASAL SINUS SURGERY    . PERCUTANEOUS CORONARY STENT INTERVENTION (PCI-S) N/A 08/21/2012   Procedure: PERCUTANEOUS CORONARY STENT INTERVENTION (PCI-S);  Surgeon: [name], MD;  Location: [facility] CATH LAB;  Service: Cardiovascular;  Laterality: N/A;  . TUBAL LIGATION       Social History   Socioeconomic History  . Marital status: Single    Spouse name: Not on file  . Number of children: 4  . Years of education: 42 th  . Highest education level: Not on file  Occupational History  . Occupation: Retired- Engineer, production    Employer: RETIRED  Social Needs  . Financial resource strain: Not on file  . Food insecurity    Worry: Not on file    Inability: Not on file  . Transportation needs    Medical: Not on file    Non-medical: Not on file  Tobacco Use  . Smoking status: Former Smoker    Packs/day: 3.00    Years: 40.00    Pack years: 120.00    Types: Cigarettes    Start date: 07/05/1960    Quit date: 07/05/1998    Years since quitting: 20.5  . Smokeless tobacco: Never Used  Substance and Sexual Activity  . Alcohol use: No    Alcohol/week: 0.0 standard drinks    Comment: 06/30/2015 "used to drink a little alcohol on the weekends; not q weekend; nothing in years"  . Drug use: No  . Sexual activity: Never  Lifestyle  . Physical activity    Days per week: Not on file    Minutes per session: Not on file  . Stress: Not on file  Relationships  . Social Herbalist on phone: Not on file    Gets together: Not on file    Attends religious service: Not on file    Active member of club or organization: Not on file    Attends meetings of clubs or organizations: Not on file    Relationship status: Not on file  . Intimate partner violence    Fear of current or  ex partner: Not on file    Emotionally abused: Not on file    Physically abused: Not on file    Forced sexual activity: Not on file  Other Topics Concern  . Not on file  Social History Narrative   Health Care POA:    Emergency Contact: daughter, Lorenso Courier [phone] or [name] [phone]   End of Life Plan: Does not want to be ventilated or feeding tubes.    Who lives with you: Lives alone   Any pets: none   Diet: Patient reports enjoying and eating junk food.  Does not regulate types of food and currently her dentures are broken so it  is hard to eat some foods.   Exercise: Patient does not have an exercise plan.   Seatbelts: Patient reports wearing seatbelt when in vehicle.   Sun Exposure/Protection: Patient reports not going outside very often.   Hobbies: Patient enjoys reading the bible and watching game shows.    Right-handed.   1 cup caffeine per day.   Former smoker-stopped 2000   Alcohol none     Family History  Problem Relation Age of Onset  . Heart disease Mother   . Diabetes Mother   . Clotting disorder Mother   . Pneumonia Father   . Rheum arthritis Father   . Diabetes Sister   . Diabetes Sister   . Asthma Brother   . Cancer Brother   . Kidney disease Brother   . Lupus Son   . Heart disease Son   . Heart disease Daughter   . Colon cancer Neg Hx      Current Outpatient Medications on File Prior to Visit  Medication Sig Dispense Refill  . ACCU-CHEK SOFTCLIX LANCETS lancets Use as instructed 100 each 3  . acetaminophen (TYLENOL) 325 MG tablet Take 650 mg by mouth every 6 (six) hours as needed for mild pain or moderate pain.    Marland Kitchen albuterol (PROAIR HFA) 108 (90 Base) MCG/ACT inhaler inhale 2 puffs by mouth every 4 hours if needed USE ONLY IF YOU ARE WHEEZING 1 Inhaler 0  . allopurinol (ZYLOPRIM) 100 MG tablet Take 1 tablet (100 mg total) by mouth daily. 90 tablet 3  . Ascorbic Acid (VITAMIN C PO) Take 1 tablet by mouth daily.    Marland Kitchen aspirin EC 81 MG tablet  Take 81 mg by mouth daily.     Marland Kitchen atorvastatin (LIPITOR) 20 MG tablet TAKE 1 TABLET BY MOUTH DAILY 30 tablet 0  . B Complex Vitamins (VITAMIN B COMPLEX) TABS Take 1 tablet by mouth daily.    . Cholecalciferol (VITAMIN D3) 5000 UNITS CAPS Take 5,000 Units by mouth daily.    . ferrous sulfate 325 (65 FE) MG tablet Take 325 mg by mouth daily with breakfast.    . fluticasone (FLOVENT HFA) 110 MCG/ACT inhaler Inhale 2 puffs into the lungs 2 (two) times daily. 3 Inhaler 3  . furosemide (LASIX) 40 MG tablet Take 1 tablet (40 mg total) by mouth 2 (two) times daily. 180 tablet 2  . gabapentin (NEURONTIN) 300 MG capsule TAKE ONE CAPSULE BY MOUTH TWICE DAILY 60 capsule 3  . guaiFENesin (ROBITUSSIN) 100 MG/5ML liquid Take 200 mg by mouth 3 (three) times daily as needed for cough.    Marland Kitchen HUMULIN N KWIKPEN 100 UNIT/ML Kiwkpen ADMINISTER 30 UNITS UNDER THE SKIN EVERY MORNING 30 mL 3  . Insulin Pen Needle (B-D ULTRAFINE III SHORT PEN) 31G X 8 MM MISC CHECK BLOOD SUGAR 3 TIMES A DAY 100 each 3  . KLOR-CON M10 10 MEQ tablet TAKE 1 TABLET BY MOUTH TWICE DAILY WITH LASIX 60 tablet 3  . levETIRAcetam (KEPPRA) 500 MG tablet Take 1 tablet (500 mg total) by mouth 2 (two) times daily. 180 tablet 3  . metoprolol tartrate (LOPRESSOR) 50 MG tablet TAKE 1 TABLET BY MOUTH EVERY DAY WITH OR IMMEDIATELY FOLLOWING A MEAL FOR HYPERRTENSION 90 tablet 0  . montelukast (SINGULAIR) 10 MG tablet Take 1 tablet (10 mg total) by mouth at bedtime. 30 tablet 0  . Multiple Vitamin (MULTIVITAMIN WITH MINERALS) TABS tablet Take 1 tablet by mouth daily.    . nitroGLYCERIN (NITROSTAT) 0.4 MG SL tablet  Place 1 tablet (0.4 mg total) under the tongue every 5 (five) minutes as needed for chest pain. 20 tablet 12  . Olopatadine HCl (PAZEO) 0.7 % SOLN Apply 1 drop to eye daily. 1 Bottle 2  . pantoprazole (PROTONIX) 40 MG tablet TAKE 1 TABLET(40 MG) BY MOUTH DAILY 90 tablet 3  . Polyethyl Glycol-Propyl Glycol (SYSTANE ULTRA OP) Place 1 drop into both eyes  every 6 (six) hours as needed (dry eyes).     No current facility-administered medications on file prior to visit.     Cardiovascular studies:  Echocardiogram 12/14/2017: - Left ventricle: The cavity size was normal. Systolic function was  mildly reduced. The estimated ejection fraction was in the range  of 45% to 50%. Akinesis of the inferior myocardium. Features are  consistent with a pseudonormal left ventricular filling pattern,  with concomitant abnormal relaxation and increased filling  pressure (grade 2 diastolic dysfunction). - Mitral valve: Mildly thickened leaflets . Chordal calcification.  There was mild regurgitation. Valve area by pressure half-time: 1.47 cm. - Left atrium: The atrium was mildly dilated. - Right atrium: The atrium was mildly dilated. - Pericardium, extracardiac: Trace pericardial effusion was  identified. - Significant improvement in LVEF compared to previous outpatient   echocardiogram in 2018.  Coronary angiography 06/30/2018: 1. Findings consistent with nonischemic cardiomyopathy, LVEF 25-30% with global hypokinesis. 2. Previously placed 2.5 mm DES in the PDA branch of the right coronary artery in 2014 is widely patent. Mid RCA has a 50% stenosis, FFR 0.84, not hemodynamically significant. 3. Diffusely diseased left coronary arteries, especially involving the distal LAD and circumflex coronary artery, may be the etiology for her angina pectoris.   Recent labs: Results for KALIYAH, GLADMAN (MRN [id number]) as of 01/18/2019 21:31  Ref. Range 12/15/2017 29:79  BASIC METABOLIC PANEL Unknown Rpt (A)  Sodium Latest Ref Range: 135 - 145 mmol/L 143  Potassium Latest Ref Range: 3.5 - 5.1 mmol/L 3.6  Chloride Latest Ref Range: 101 - 111 mmol/L 100 (L)  CO2 Latest Ref Range: 22 - 32 mmol/L 31  Glucose Latest Ref Range: 65 - 99 mg/dL 106 (H)  BUN Latest Ref Range: 6 - 20 mg/dL 16  Creatinine Latest Ref Range: 0.44 - 1.00 mg/dL 1.47 (H)  Calcium Latest Ref Range: 8.9 -  10.3 mg/dL 8.9  Anion gap Latest Ref Range: 5 - 15  12  GFR, Est Non African American Latest Ref Range: >60 mL/min 33 (L)  GFR, Est African American Latest Ref Range: >60 mL/min 38 (L)    ROS       Vitals:   01/10/19 1034  BP: 126/84   (Measured by the patient using a home BP monitor)    Objective:    Physical Exam Not performed. Telephone encounter       Assessment & Recommendations:   78 year old African-American female with nonischemic cardiomyopathy, CAD (PCI RCA), obesity, hypertension, type II diabetes mellitus, morbid obesity, peripheral neuropathy.  Chest pain: Recurrent chest pain. She has intolerance to Bidil-suggests her lips got swollen. I do not find any evidence to confirm this. Nonetheless, I feel further evaluation and discussion should occur in an in person regarding management of possible angina symptoms.   Will arrange in office visit. Continue as needed SL NTG use till then.   Time spent: 21 min  [place], MD [facility] Pager: [phone] Office: [phone] If no answer Cell [phone]

## 2019-01-19 ENCOUNTER — Encounter: Payer: Self-pay | Admitting: Cardiology

## 2019-01-29 ENCOUNTER — Other Ambulatory Visit: Payer: Self-pay

## 2019-01-29 ENCOUNTER — Encounter: Payer: Self-pay | Admitting: Cardiology

## 2019-01-29 ENCOUNTER — Ambulatory Visit (INDEPENDENT_AMBULATORY_CARE_PROVIDER_SITE_OTHER): Payer: Medicare Other | Admitting: Cardiology

## 2019-01-29 VITALS — BP 140/58 | HR 63 | Ht 63.0 in | Wt 279.3 lb

## 2019-01-29 DIAGNOSIS — I25118 Atherosclerotic heart disease of native coronary artery with other forms of angina pectoris: Secondary | ICD-10-CM | POA: Diagnosis not present

## 2019-01-29 DIAGNOSIS — M329 Systemic lupus erythematosus, unspecified: Secondary | ICD-10-CM | POA: Diagnosis not present

## 2019-01-29 DIAGNOSIS — R0789 Other chest pain: Secondary | ICD-10-CM

## 2019-01-29 DIAGNOSIS — R079 Chest pain, unspecified: Secondary | ICD-10-CM

## 2019-01-29 MED ORDER — NITROGLYCERIN 0.4 MG SL SUBL: 0.4000 mg | SUBLINGUAL_TABLET | SUBLINGUAL | 3 refills | Status: DC | PRN

## 2019-01-29 MED ORDER — ISOSORBIDE MONONITRATE ER 30 MG PO TB24: 30.0000 mg | ORAL_TABLET | Freq: Every day | ORAL | 3 refills | Status: DC

## 2019-01-29 NOTE — Progress Notes (Signed)
Subjective:   Brittany Roberts, female    DOB: 1940/08/28, 78 y.o.   MRN: [id number]   Chief complaint:  Chest pain     78 year old African-American female with nonischemic cardiomyopathy, CAD (PCI RCA), obesity, hypertension, type II diabetes mellitus, morbid obesity, peripheral neuropathy.  Patient reports episodes of chest pain several times in the past few weeks. Episodes typically occur at rest, especially ay night. They are associated with diaphoresis. Chest pain improved with SL NTG. She denies chest pain while walking.   On a separate note, she is worried about her lupus. She has noticed rash on her face, and also complaints of joint pains. She requests me to refer her to a rheumatologist.   Past Medical History:  Diagnosis Date  . Acute delirium 01/14/2017  . Anxiety   . Arthritis    "all over"  . Asthma   . Bipolar disorder ([place])   . Complication of anesthesia    "w/right foot OR they gave me too much and they couldn't get me woke"  . Congestive heart failure, unspecified   . Coronary artery calcification seen on CAT scan 01/14/2017  . Coronary artery disease    "I've got 1 stent" (06/30/2015)  . Depression   . Discoid lupus   . Fibromyalgia    "I've been told I have this; can't take Lyrica cause I'm allergic to it" (06/30/2015)  . GERD (gastroesophageal reflux disease)   . History of gout   . History of hiatal hernia    "real bad" (06/30/2015)  . Hypertension   . Hypothyroidism   . On home oxygen therapy    "2L q hs" (06/30/2015)  . OSA (obstructive sleep apnea)    "just use my oxygen; no mask" (06/30/2015)  . Other and unspecified hyperlipidemia   . Penetrating foot wound    left nonhealing foot wound on the dorsal surface  . Peripheral neuropathy   . Pneumonia "many of times"  . Refusal of blood transfusions as patient is Jehovah's Witness   . Renal failure, unspecified    "kidneys not working 100%" (06/30/2015)  . Sickle cell trait ([place])   . Systemic  lupus ([place])   . Thoracic aortic atherosclerosis ([place]) 01/14/2017  . Type II diabetes mellitus ([place])   . Unspecified vitamin D deficiency      Past Surgical History:  Procedure Laterality Date  . ANKLE ARTHROSCOPY WITH OPEN REDUCTION INTERNAL FIXATION (ORIF) Right 03/2011  . CARDIAC CATHETERIZATION N/A 07/01/2015   Procedure: Left Heart Cath and Coronary Angiography;  Surgeon: Adrian Prows, MD;  Location: [facility];  Service: Cardiovascular;  Laterality: N/A;  . CARDIAC CATHETERIZATION N/A 07/01/2015   Procedure: Intravascular Pressure Wire/FFR Study;  Surgeon: Adrian Prows, MD;  Location: [facility];  Service: Cardiovascular;  Laterality: N/A;  . CARPAL TUNNEL RELEASE Bilateral   . CATARACT EXTRACTION W/ INTRAOCULAR LENS  IMPLANT, BILATERAL Bilateral   . CHOLECYSTECTOMY OPEN    . COLONOSCOPY N/A 12/10/2013   Procedure: COLONOSCOPY;  Surgeon: Inda Castle, MD;  Location: [facility];  Service: Endoscopy;  Laterality: N/A;  . FRACTURE SURGERY    . INCISION AND DRAINAGE ABSCESS Left    foot "under my little toe"  . KNEE LIGAMENT RECONSTRUCTION Right   . LEFT HEART CATHETERIZATION WITH CORONARY ANGIOGRAM N/A 08/08/2012   Procedure: LEFT HEART CATHETERIZATION WITH CORONARY ANGIOGRAM;  Surgeon: [name], MD;  [facility];  Service: Cardiovascular;  Laterality: N/A;  . NASAL SINUS  SURGERY    . PERCUTANEOUS CORONARY STENT INTERVENTION (PCI-S) N/A 08/21/2012   Procedure: PERCUTANEOUS CORONARY STENT INTERVENTION (PCI-S);  Surgeon: [name], MD;  Location: [facility] CATH LAB;  Service: Cardiovascular;  Laterality: N/A;  . TUBAL LIGATION       Social History   Socioeconomic History  . Marital status: Single    Spouse name: Not on file  . Number of children: 4  . Years of education: 67 th  . Highest education level: Not on file  Occupational History  . Occupation: Retired- Engineer, production    Employer: RETIRED  Social Needs  . Financial resource  strain: Not on file  . Food insecurity    Worry: Not on file    Inability: Not on file  . Transportation needs    Medical: Not on file    Non-medical: Not on file  Tobacco Use  . Smoking status: Former Smoker    Packs/day: 3.00    Years: 40.00    Pack years: 120.00    Types: Cigarettes    Start date: 07/05/1960    Quit date: 07/05/1998    Years since quitting: 20.5  . Smokeless tobacco: Never Used  Substance and Sexual Activity  . Alcohol use: No    Alcohol/week: 0.0 standard drinks    Comment: havent drank in over 40 years  . Drug use: No  . Sexual activity: Never  Lifestyle  . Physical activity    Days per week: Not on file    Minutes per session: Not on file  . Stress: Not on file  Relationships  . Social Herbalist on phone: Not on file    Gets together: Not on file    Attends religious service: Not on file    Active member of club or organization: Not on file    Attends meetings of clubs or organizations: Not on file    Relationship status: Not on file  . Intimate partner violence    Fear of current or ex partner: Not on file    Emotionally abused: Not on file    Physically abused: Not on file    Forced sexual activity: Not on file  Other Topics Concern  . Not on file  Social History Narrative   Health Care POA:    Emergency Contact: daughter, Lorenso Courier [phone] or [name] [phone]   End of Life Plan: Does not want to be ventilated or feeding tubes.    Who lives with you: Lives alone   Any pets: none   Diet: Patient reports enjoying and eating junk food.  Does not regulate types of food and currently her dentures are broken so it is hard to eat some foods.   Exercise: Patient does not have an exercise plan.   Seatbelts: Patient reports wearing seatbelt when in vehicle.   Sun Exposure/Protection: Patient reports not going outside very often.   Hobbies: Patient enjoys reading the bible and watching game shows.    Right-handed.   1 cup caffeine  per day.   Former smoker-stopped 2000   Alcohol none     Family History  Problem Relation Age of Onset  . Heart disease Mother   . Diabetes Mother   . Clotting disorder Mother   . Pneumonia Father   . Rheum arthritis Father   . Diabetes Sister   . Diabetes Sister   . Asthma Brother   . Cancer Brother   . Kidney disease Brother   .  Lupus Son   . Heart disease Son   . Heart disease Daughter   . Colon cancer Neg Hx      Current Outpatient Medications on File Prior to Visit  Medication Sig Dispense Refill  . ACCU-CHEK SOFTCLIX LANCETS lancets Use as instructed 100 each 3  . acetaminophen (TYLENOL) 325 MG tablet Take 650 mg by mouth every 6 (six) hours as needed for mild pain or moderate pain.    Marland Kitchen albuterol (PROAIR HFA) 108 (90 Base) MCG/ACT inhaler inhale 2 puffs by mouth every 4 hours if needed USE ONLY IF YOU ARE WHEEZING 1 Inhaler 0  . allopurinol (ZYLOPRIM) 100 MG tablet Take 1 tablet (100 mg total) by mouth daily. 90 tablet 3  . amLODipine (NORVASC) 10 MG tablet Take 10 mg by mouth daily.    . Ascorbic Acid (VITAMIN C PO) Take 1 tablet by mouth daily.    Marland Kitchen aspirin EC 81 MG tablet Take 81 mg by mouth daily.     Marland Kitchen atorvastatin (LIPITOR) 20 MG tablet TAKE 1 TABLET BY MOUTH DAILY 30 tablet 0  . B Complex Vitamins (VITAMIN B COMPLEX) TABS Take 1 tablet by mouth daily.    . Cholecalciferol (VITAMIN D3) 5000 UNITS CAPS Take 5,000 Units by mouth daily.    . ferrous sulfate 325 (65 FE) MG tablet Take 325 mg by mouth daily with breakfast.    . fluticasone (FLOVENT HFA) 110 MCG/ACT inhaler Inhale 2 puffs into the lungs 2 (two) times daily. 3 Inhaler 3  . furosemide (LASIX) 40 MG tablet Take 20 mg by mouth daily.    Marland Kitchen gabapentin (NEURONTIN) 300 MG capsule TAKE ONE CAPSULE BY MOUTH TWICE DAILY 60 capsule 3  . HUMULIN N KWIKPEN 100 UNIT/ML Kiwkpen ADMINISTER 30 UNITS UNDER THE SKIN EVERY MORNING 30 mL 3  . Insulin Pen Needle (B-D ULTRAFINE III SHORT PEN) 31G X 8 MM MISC CHECK BLOOD  SUGAR 3 TIMES A DAY 100 each 3  . KLOR-CON M10 10 MEQ tablet TAKE 1 TABLET BY MOUTH TWICE DAILY WITH LASIX 60 tablet 3  . levETIRAcetam (KEPPRA) 500 MG tablet Take 1 tablet (500 mg total) by mouth 2 (two) times daily. 180 tablet 3  . metoprolol succinate (TOPROL-XL) 50 MG 24 hr tablet Take 50 mg by mouth daily. Take with or immediately following a meal.    . montelukast (SINGULAIR) 10 MG tablet Take 1 tablet (10 mg total) by mouth at bedtime. 30 tablet 0  . Multiple Vitamin (MULTIVITAMIN WITH MINERALS) TABS tablet Take 1 tablet by mouth daily.    . nitroGLYCERIN (NITROSTAT) 0.4 MG SL tablet Place 1 tablet (0.4 mg total) under the tongue every 5 (five) minutes as needed for chest pain. 20 tablet 12  . Olopatadine HCl (PAZEO) 0.7 % SOLN Apply 1 drop to eye daily. 1 Bottle 2  . pantoprazole (PROTONIX) 40 MG tablet TAKE 1 TABLET(40 MG) BY MOUTH DAILY 90 tablet 3   No current facility-administered medications on file prior to visit.     Cardiovascular studies:  EKG 01/29/2019: Sinus rhyth 58 bpm. Left axis deviation. First degree A-V block Low voltage.  Old inferior infarct No change compared to previous EKG's  Echocardiogram 12/14/2017: - Left ventricle: The cavity size was normal. Systolic function was  mildly reduced. The estimated ejection fraction was in the range  of 45% to 50%. Akinesis of the inferior myocardium. Features are  consistent with a pseudonormal left ventricular filling pattern,  with concomitant abnormal relaxation and increased filling  pressure (grade 2 diastolic dysfunction). - Mitral valve: Mildly thickened leaflets . Chordal calcification.  There was mild regurgitation. Valve area by pressure half-time: 1.47 cm. - Left atrium: The atrium was mildly dilated. - Right atrium: The atrium was mildly dilated. - Pericardium, extracardiac: Trace pericardial effusion was  identified. - Significant improvement in LVEF compared to previous outpatient   echocardiogram in 2018.   Coronary angiography 07/01/2015: 1. Findings consistent with nonischemic cardiomyopathy, LVEF 25-30% with global hypokinesis. 2. Previously placed 2.5 mm DES in the PDA branch of the right coronary artery in 2014 is widely patent. Mid RCA has a 50% stenosis, FFR 0.84, not hemodynamically significant. 3. Diffusely diseased left coronary arteries, especially involving the distal LAD and circumflex coronary artery, may be the etiology for her angina pectoris.   Recent labs: Results for AUBRY, RANKIN (MRN [id number]) as of 01/18/2019 21:31  Ref. Range 12/15/2017 12:87  BASIC METABOLIC PANEL Unknown Rpt (A)  Sodium Latest Ref Range: 135 - 145 mmol/L 143  Potassium Latest Ref Range: 3.5 - 5.1 mmol/L 3.6  Chloride Latest Ref Range: 101 - 111 mmol/L 100 (L)  CO2 Latest Ref Range: 22 - 32 mmol/L 31  Glucose Latest Ref Range: 65 - 99 mg/dL 106 (H)  BUN Latest Ref Range: 6 - 20 mg/dL 16  Creatinine Latest Ref Range: 0.44 - 1.00 mg/dL 1.47 (H)  Calcium Latest Ref Range: 8.9 - 10.3 mg/dL 8.9  Anion gap Latest Ref Range: 5 - 15  12  GFR, Est Non African American Latest Ref Range: >60 mL/min 33 (L)  GFR, Est African American Latest Ref Range: >60 mL/min 38 (L)    Review of Systems  Constitution: Negative for decreased appetite, malaise/fatigue, weight gain and weight loss.  HENT: Negative for congestion.   Eyes: Negative for visual disturbance.  Cardiovascular: Positive for chest pain. Negative for dyspnea on exertion, leg swelling, palpitations and syncope.  Respiratory: Negative for cough.   Endocrine: Negative for cold intolerance.  Hematologic/Lymphatic: Does not bruise/bleed easily.  Skin: Negative for itching and rash.       Rash on face  Musculoskeletal: Positive for joint pain. Negative for myalgias.  Gastrointestinal: Negative for abdominal pain, nausea and vomiting.  Genitourinary: Negative for dysuria.  Neurological: Negative for dizziness and weakness.  Psychiatric/Behavioral: The  patient is not nervous/anxious.   All other systems reviewed and are negative.        Vitals:   01/29/19 1022  BP: (!) 140/58  Pulse: 63  SpO2: 93%      Objective:    Physical Exam  Constitutional: She is oriented to person, place, and time. She appears well-developed and well-nourished. No distress.  HENT:  Head: Normocephalic and atraumatic.  Eyes: Pupils are equal, round, and reactive to light. Conjunctivae are normal.  Malar rash  Neck: No JVD present.  Cardiovascular: Normal rate, regular rhythm and intact distal pulses.  Pulmonary/Chest: Effort normal and breath sounds normal. She has no wheezes. She has no rales.  Abdominal: Soft. Bowel sounds are normal. There is no rebound.  Musculoskeletal:        General: No edema.  Lymphadenopathy:    She has no cervical adenopathy.  Neurological: She is alert and oriented to person, place, and time. No cranial nerve deficit.  Skin: Skin is warm and dry.  Malar rash   Psychiatric: She has a normal mood and affect.  Nursing note and vitals reviewed.        Assessment & Recommendations:   78 year old  African-American female with nonischemic cardiomyopathy, CAD (PCI RCA), obesity, hypertension, type II diabetes mellitus, morbid obesity, peripheral neuropathy.  Chest pain: Recurrent chest pain at rest, but relieved with nitroglycerin. She does not report chest pain with exertion. I am going to give her a trial of Imdur 30 mg daily. While she has had "lip swelling" with Bidil in the past according to her, I suspect this may have been due to hydrlalazine component and not due to nitrate- as she has no side effect with sublingual NTG. If no improvement with Imdur, will then obtain a stress test.  Lupus: Recent worsening of malar rash, and joint pain. Will refer to rheumatology, at patient's request.   F/u telephone visit in 4 weeks.  Nigel Mormon, MD [facility] Pager: [phone] Office:  [phone] If no answer Cell [phone]

## 2019-01-30 ENCOUNTER — Inpatient Hospital Stay: Payer: Medicare Other | Attending: Internal Medicine | Admitting: Internal Medicine

## 2019-02-01 DIAGNOSIS — H905 Unspecified sensorineural hearing loss: Secondary | ICD-10-CM | POA: Diagnosis not present

## 2019-02-12 DIAGNOSIS — J45909 Unspecified asthma, uncomplicated: Secondary | ICD-10-CM | POA: Diagnosis not present

## 2019-02-12 DIAGNOSIS — I10 Essential (primary) hypertension: Secondary | ICD-10-CM | POA: Diagnosis not present

## 2019-02-12 DIAGNOSIS — M25869 Other specified joint disorders, unspecified knee: Secondary | ICD-10-CM | POA: Diagnosis not present

## 2019-02-13 ENCOUNTER — Telehealth: Payer: Self-pay | Admitting: Neurology

## 2019-02-13 NOTE — Telephone Encounter (Signed)
Pt would like to speak to someone about getting a referral for richard sater at [facility]

## 2019-02-14 ENCOUNTER — Other Ambulatory Visit: Payer: Self-pay | Admitting: Family Medicine

## 2019-02-14 ENCOUNTER — Other Ambulatory Visit: Payer: Self-pay

## 2019-02-14 DIAGNOSIS — F32A Depression, unspecified: Secondary | ICD-10-CM

## 2019-02-14 DIAGNOSIS — F03A0 Unspecified dementia, mild, without behavioral disturbance, psychotic disturbance, mood disturbance, and anxiety: Secondary | ICD-10-CM

## 2019-02-14 DIAGNOSIS — F0390 Unspecified dementia without behavioral disturbance: Secondary | ICD-10-CM

## 2019-02-14 DIAGNOSIS — G40209 Localization-related (focal) (partial) symptomatic epilepsy and epileptic syndromes with complex partial seizures, not intractable, without status epilepticus: Secondary | ICD-10-CM

## 2019-02-14 DIAGNOSIS — F329 Major depressive disorder, single episode, unspecified: Secondary | ICD-10-CM

## 2019-02-14 NOTE — Telephone Encounter (Signed)
Dr. Delice Lesch,  Did you want to refer pt to [facility]?

## 2019-02-14 NOTE — Telephone Encounter (Signed)
That is fine, pls send referral to [facility], and write that patient is requesting a second opinion from Dr. Felecia Shelling, thanks

## 2019-02-14 NOTE — Telephone Encounter (Signed)
Referral sent to [facility]

## 2019-02-25 IMAGING — CT CT HEAD W/O CM
5 of 8 series · 16 of 47 positions shown, 17 images · non-contrast
Comparison: MRI head 02/21/2015.  Head CT 08/01/2014.

CLINICAL DATA: Seizure activity.

EXAM:
CT HEAD WITHOUT CONTRAST
TECHNIQUE: Contiguous axial images were obtained from the base of the skull
through the vertex without intravenous contrast.

[Series 3: head without · axial · non-contrast · 0.41mm/px · z∈[+832,+892]mm · 2 of 36 slices shown, 3 images (1 of 2)]
[im 12/36  brain]
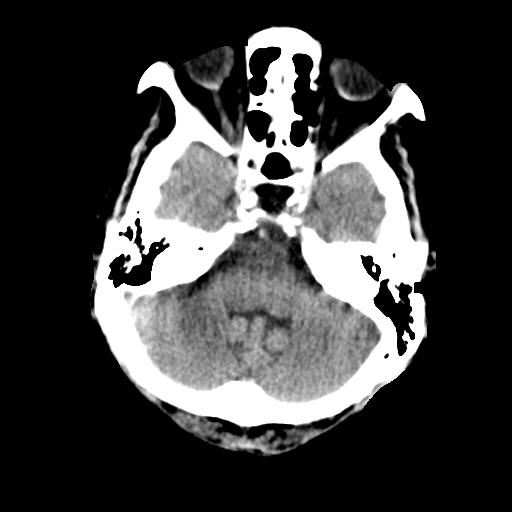
[im 12/36  bone]
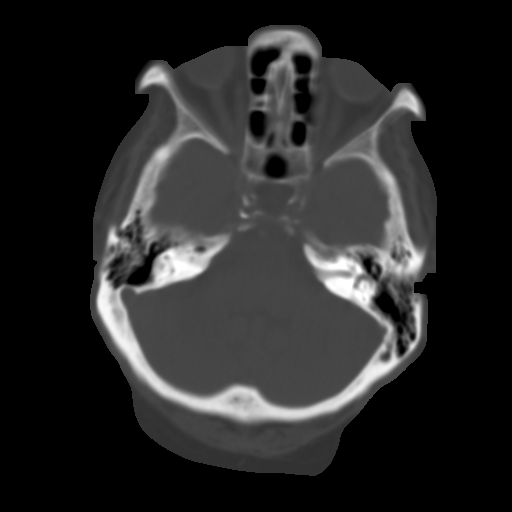
[im 24/36  brain]
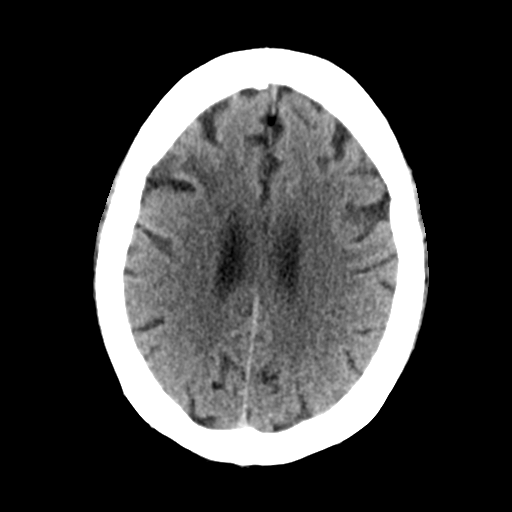

[Series 4: head bone · axial · 0.41mm/px · z∈[+793,+917]mm · 7 of 89 slices shown]
[im 9/89  bone]
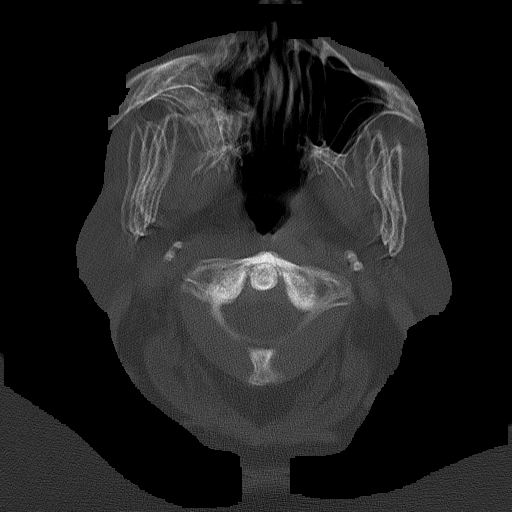
[im 18/89  bone]
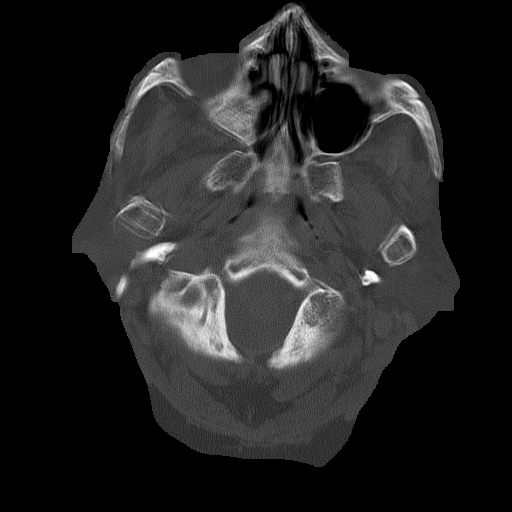
[im 27/89  bone]
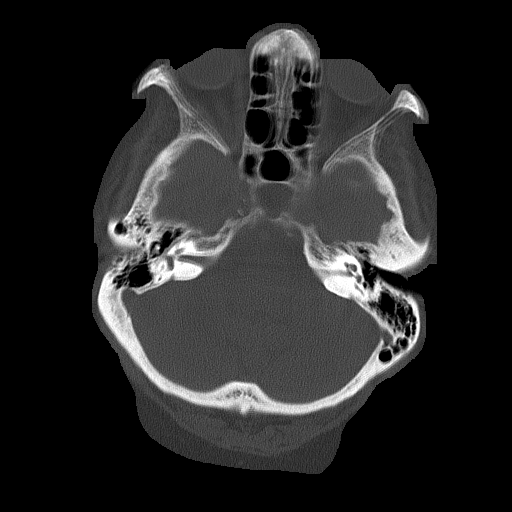
[im 36/89  bone]
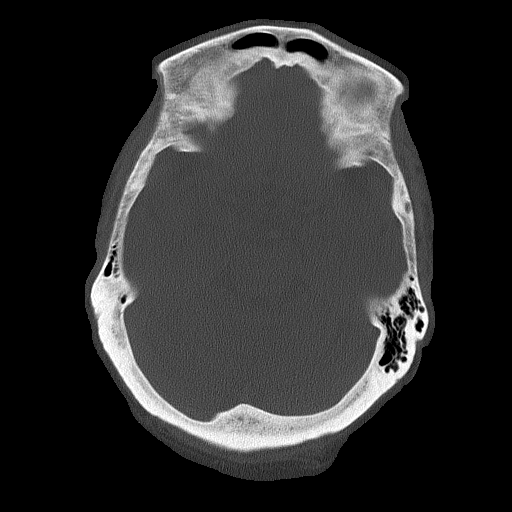
[im 53/89  bone]
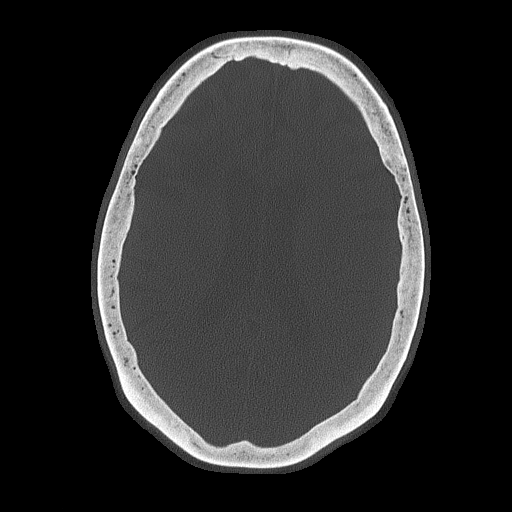
[im 62/89  bone]
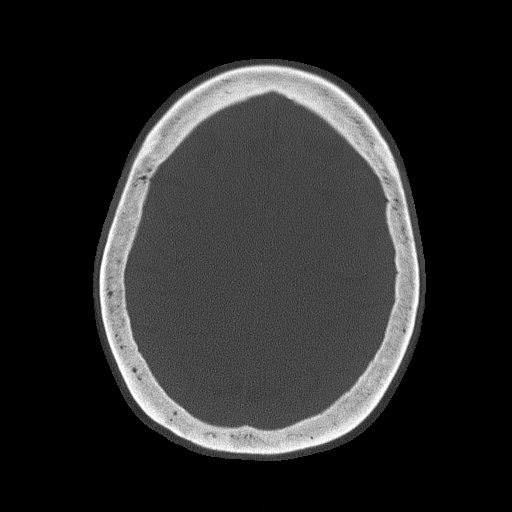
[im 71/89  bone]
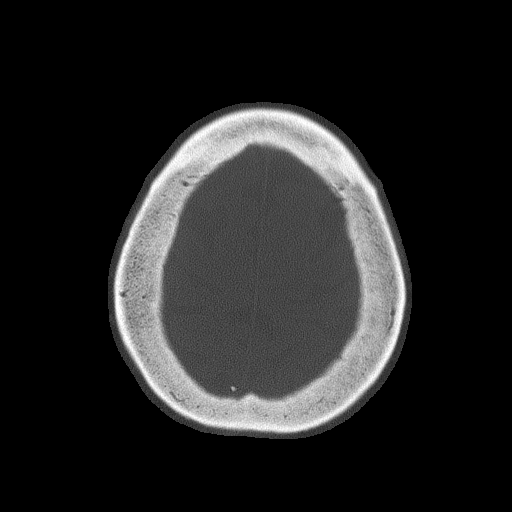

[Series 5: head without cor · coronal · non-contrast · 0.35mm/px · 3 of 67 slices shown]
[im 17/67  brain]
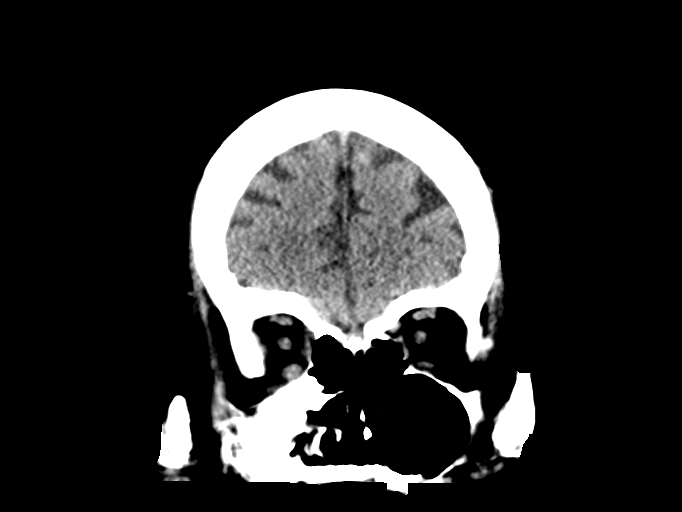
[im 34/67  brain]
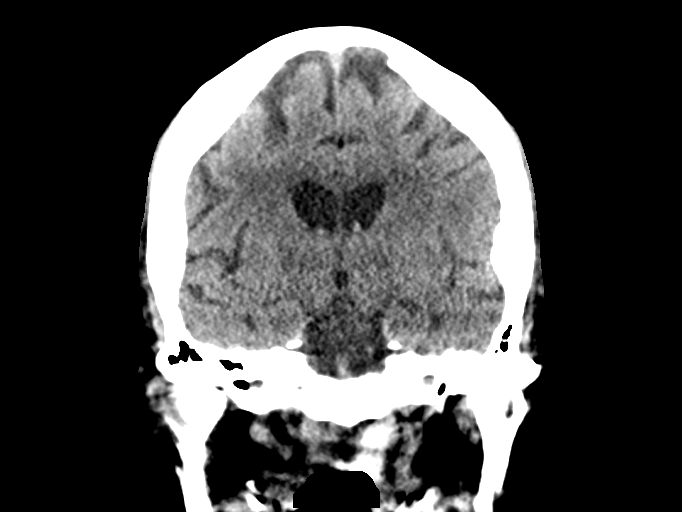
[im 50/67  brain]
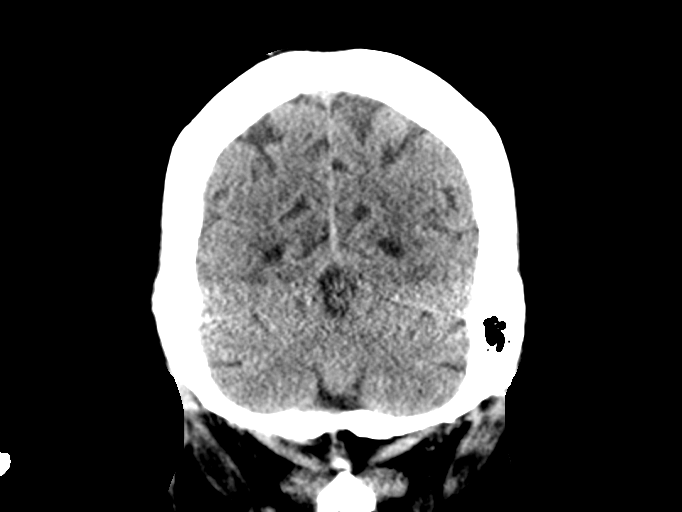

[Series 7: head without · axial · non-contrast · 0.41mm/px · z∈[+833,+893]mm · 2 of 36 slices shown (2 of 2)]
[im 12/36  brain]
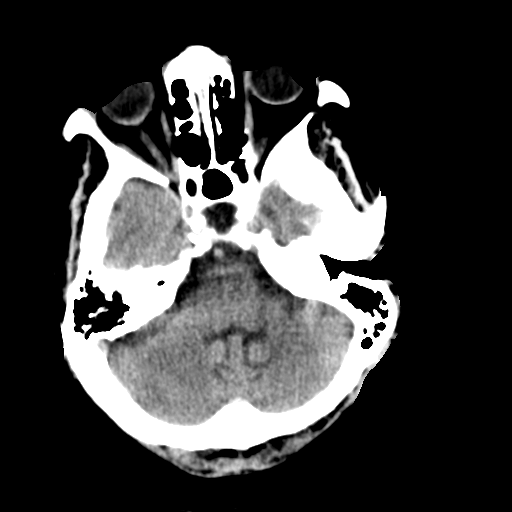
[im 24/36  brain]
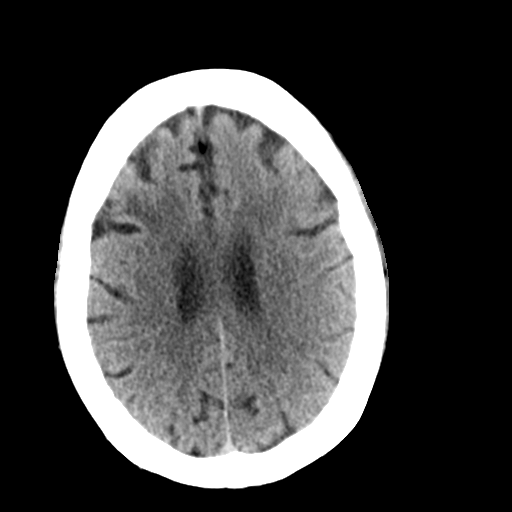

[Series 10: head without sag · sagittal · non-contrast · 0.35mm/px · 2 of 67 slices shown]
[im 23/67  brain]
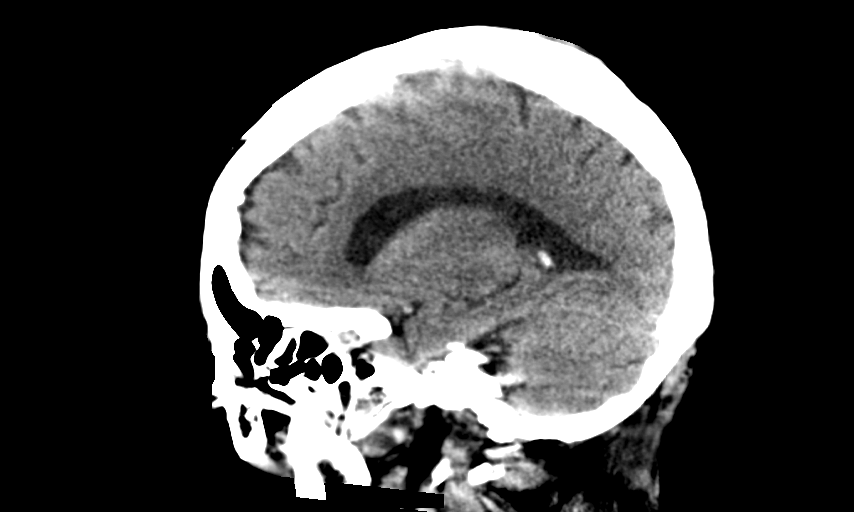
[im 45/67  brain]
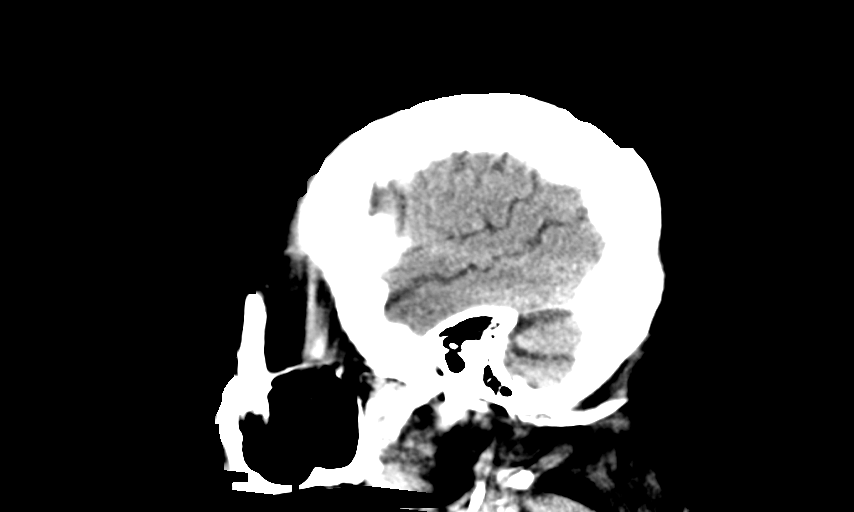

[16 of 47 positions shown; findings below may reference images not displayed]

FINDINGS: Brain: Hypo attenuation in the posterior right frontal lobe is
probably encephalomalacia related to prior infarct although there is
some preservation of the overlying cortex. No evidence for acute
hemorrhage, hydrocephalus, or abnormal extra-axial fluid collection.
Diffuse loss of parenchymal volume is consistent with atrophy.
Patchy low attenuation in the deep hemispheric and periventricular
white matter is nonspecific, but likely reflects chronic
microvascular ischemic demyelination.

Vascular: No hyperdense vessel or unexpected calcification.

Skull: No evidence for fracture. No worrisome lytic or sclerotic
lesion.

Sinuses/Orbits: The visualized paranasal sinuses and mastoid air
cells are clear. Visualized portions of the globes and intraorbital
fat are unremarkable.

Other: None.
IMPRESSION: 1. Focal hypo attenuation posterior right frontal lobe is new since
the 6928 exams and probably related to interval nonacute infarct
given subtle ex vacuo dilatation right frontal horn. There is some
preservation of the overlying cortex and although vasogenic edema is
considered less likely, MRI of the brain is suggested for more
definitive characterization.
2. Diffuse atrophy with chronic small vessel white matter ischemic
disease.
3.

## 2019-02-25 IMAGING — DX DG CHEST 2V
2 series · 2 of 2 positions shown · non-contrast
Comparison: 08/01/2010

CLINICAL DATA: Altered mental status

EXAM:
CHEST  2 VIEW

[x chest ap]
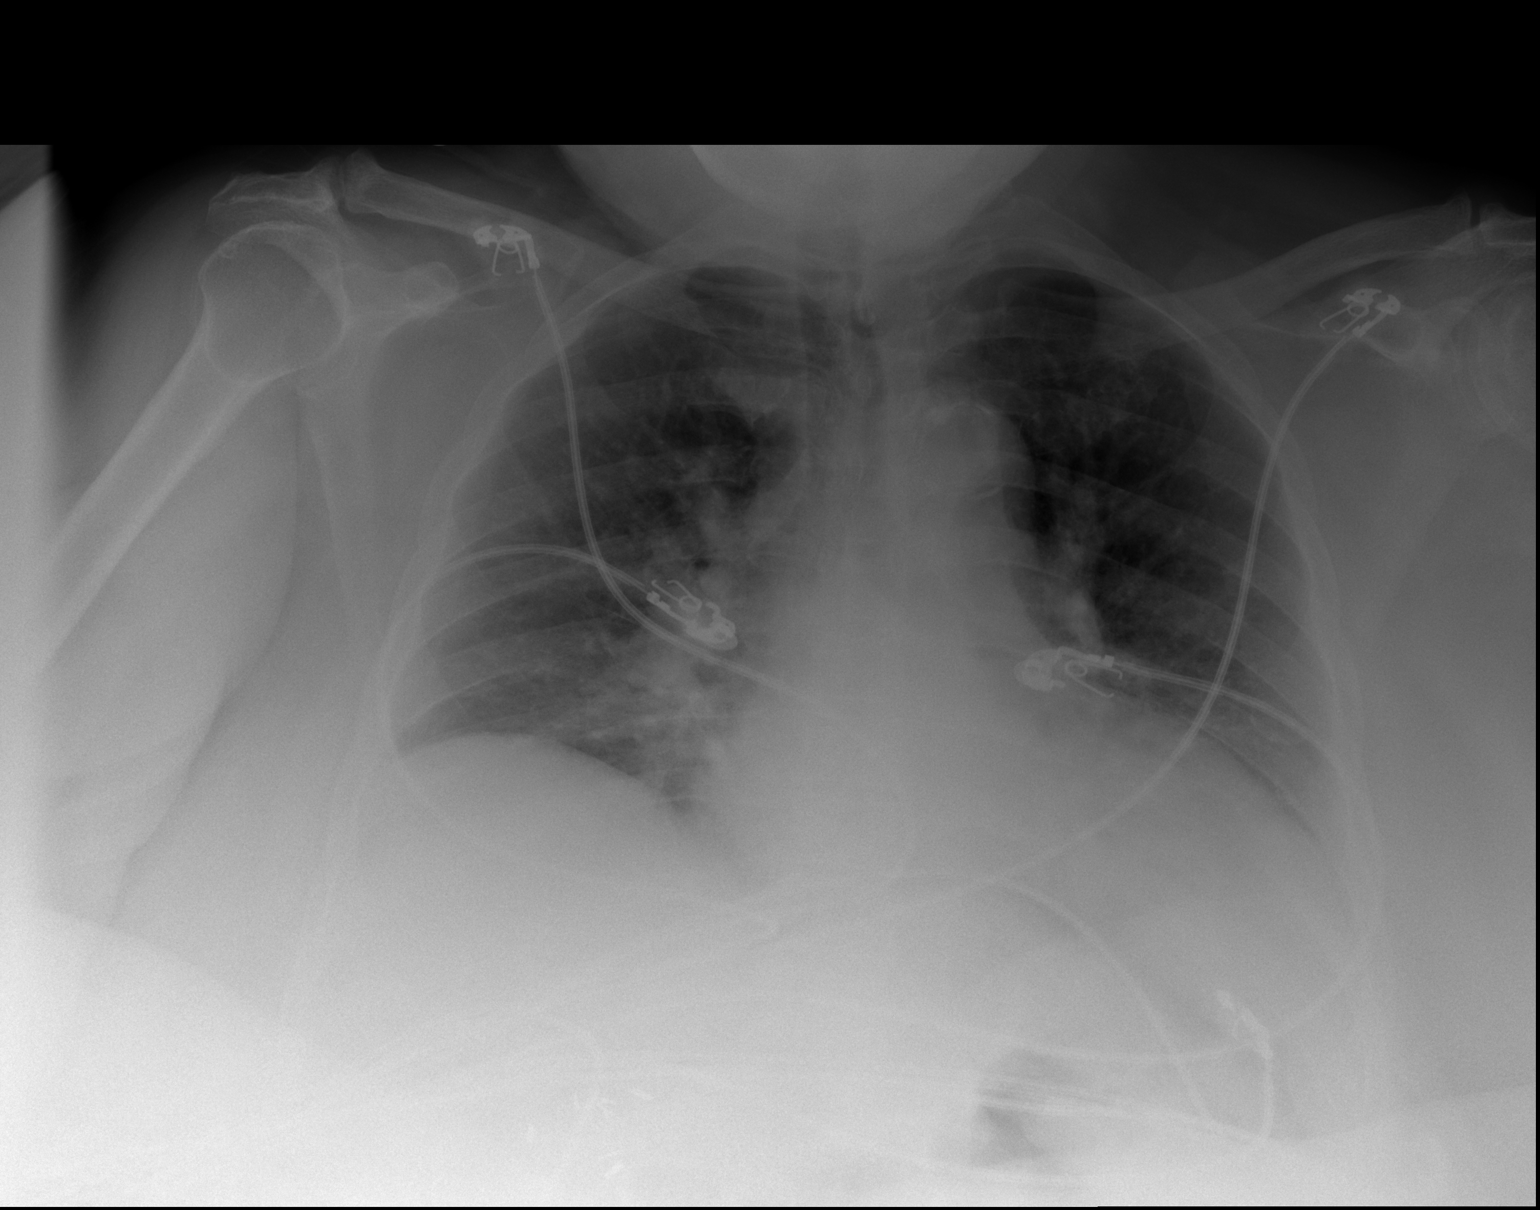

[w chest lat]
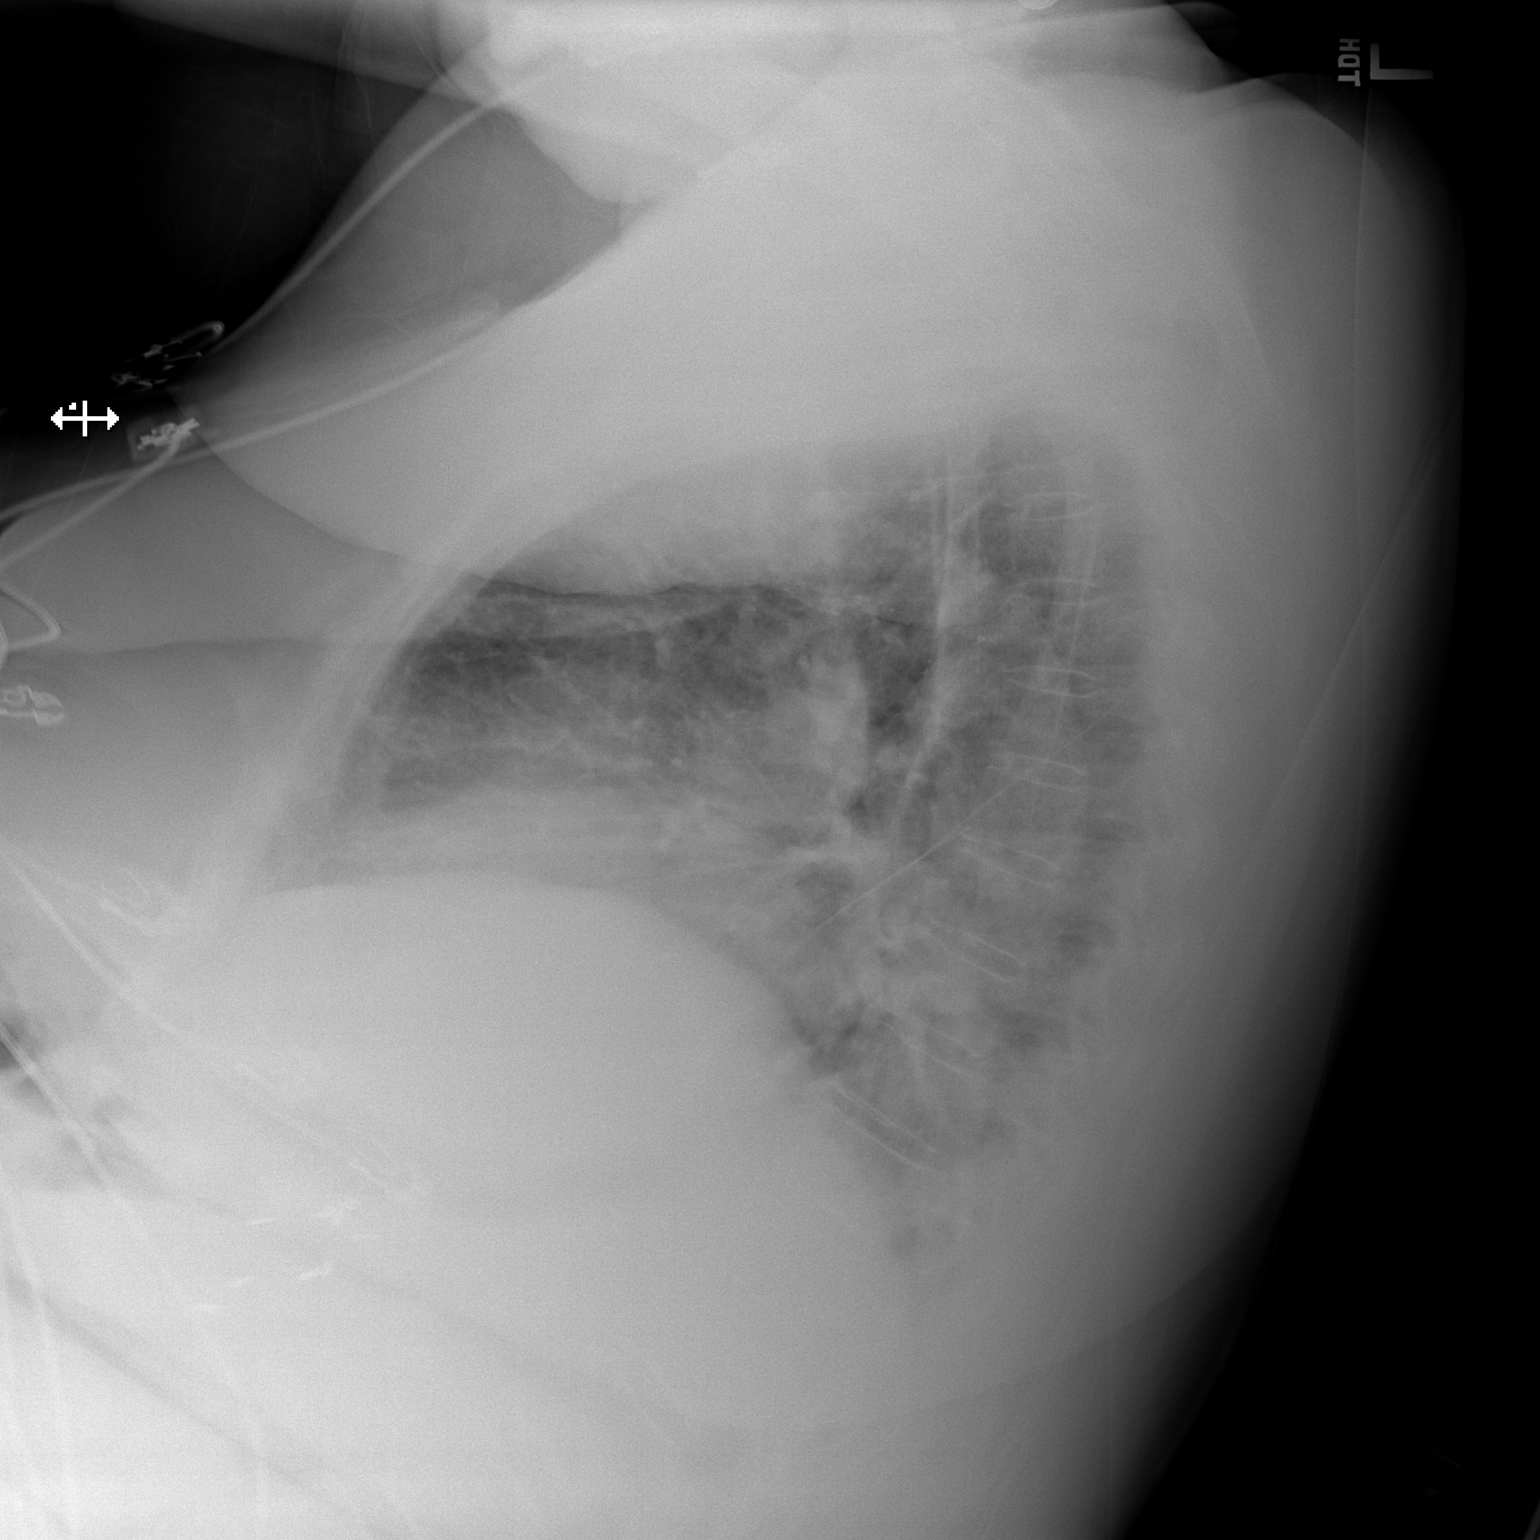

[2 of 2 positions shown; findings below may reference images not displayed]

FINDINGS: There is no focal consolidation. There are low lung volumes. There
is bilateral interstitial prominence and prominence of the central
pulmonary vasculature likely accentuated by low lung volumes. There
is no pleural effusion or pneumothorax. There is stable
cardiomegaly.

The osseous structures are unremarkable.
IMPRESSION: Cardiomegaly.  No evidence of CHF.

## 2019-02-28 ENCOUNTER — Telehealth (INDEPENDENT_AMBULATORY_CARE_PROVIDER_SITE_OTHER): Payer: Medicare Other | Admitting: Cardiology

## 2019-02-28 ENCOUNTER — Telehealth: Payer: Self-pay | Admitting: Family Medicine

## 2019-02-28 DIAGNOSIS — M329 Systemic lupus erythematosus, unspecified: Secondary | ICD-10-CM | POA: Diagnosis not present

## 2019-02-28 DIAGNOSIS — I25118 Atherosclerotic heart disease of native coronary artery with other forms of angina pectoris: Secondary | ICD-10-CM | POA: Diagnosis not present

## 2019-02-28 DIAGNOSIS — R0789 Other chest pain: Secondary | ICD-10-CM | POA: Diagnosis not present

## 2019-02-28 DIAGNOSIS — R079 Chest pain, unspecified: Secondary | ICD-10-CM

## 2019-02-28 NOTE — Progress Notes (Signed)
Subjective:   Brittany Roberts, female    DOB: 07-19-1940, 78 y.o.   MRN: [id number]   I connected with the patient on 02/28/2019 by a telephone call and verified that I am speaking with the correct person using two identifiers.     I offered the patient a video enabled application for a virtual visit. Unfortunately, this could not be accomplished due to technical difficulties/lack of video enabled phone/computer. I discussed the limitations of evaluation and management by telemedicine and the availability of in person appointments. The patient expressed understanding and agreed to proceed.   This visit type was conducted due to national recommendations for restrictions regarding the COVID-19 Pandemic (e.g. social distancing).  This format is felt to be most appropriate for this patient at this time.  All issues noted in this document were discussed and addressed.  No physical exam was performed (except for noted visual exam findings with Tele health visits).  The patient has consented to conduct a Tele health visit and understands insurance will be billed.   Chief complaint:  Chest pain  78 year old African-American female with nonischemic cardiomyopathy, CAD (PCI RCA), obesity, hypertension, type II diabetes mellitus, morbid obesity, peripheral neuropathy, lupus.  She has not been taking Imdur. However, she has not had any chest pain. Her biggest complaint is feeling tired and fatigued on waking up in the morning. She continues to be concerned about her lupus.   Past Medical History:  Diagnosis Date  . Acute delirium 01/14/2017  . Anxiety   . Arthritis    "all over"  . Asthma   . Bipolar disorder ([place])   . Complication of anesthesia    "w/right foot OR they gave me too much and they couldn't get me woke"  . Congestive heart failure, unspecified   . Coronary artery calcification seen on CAT scan 01/14/2017  . Coronary artery disease    "I've got 1 stent" (06/30/2015)  . Depression   .  Discoid lupus   . Fibromyalgia    "I've been told I have this; can't take Lyrica cause I'm allergic to it" (06/30/2015)  . GERD (gastroesophageal reflux disease)   . History of gout   . History of hiatal hernia    "real bad" (06/30/2015)  . Hypertension   . Hypothyroidism   . On home oxygen therapy    "2L q hs" (06/30/2015)  . OSA (obstructive sleep apnea)    "just use my oxygen; no mask" (06/30/2015)  . Other and unspecified hyperlipidemia   . Penetrating foot wound    left nonhealing foot wound on the dorsal surface  . Peripheral neuropathy   . Pneumonia "many of times"  . Refusal of blood transfusions as patient is Jehovah's Witness   . Renal failure, unspecified    "kidneys not working 100%" (06/30/2015)  . Sickle cell trait ([place])   . Systemic lupus ([place])   . Thoracic aortic atherosclerosis ([place]) 01/14/2017  . Type II diabetes mellitus ([place])   . Unspecified vitamin D deficiency      Past Surgical History:  Procedure Laterality Date  . ANKLE ARTHROSCOPY WITH OPEN REDUCTION INTERNAL FIXATION (ORIF) Right 03/2011  . CARDIAC CATHETERIZATION N/A 07/01/2015   Procedure: Left Heart Cath and Coronary Angiography;  Surgeon: Adrian Prows, MD;  Location: [place] [facility];  Service: Cardiovascular;  Laterality: N/A;  . CARDIAC CATHETERIZATION N/A 07/01/2015   Procedure: Intravascular Pressure Wire/FFR Study;  Surgeon: Adrian Prows, MD;  Location: [place] [facility];  Service:  Cardiovascular;  Laterality: N/A;  . CARPAL TUNNEL RELEASE Bilateral   . CATARACT EXTRACTION W/ INTRAOCULAR LENS  IMPLANT, BILATERAL Bilateral   . CHOLECYSTECTOMY OPEN    . COLONOSCOPY N/A 12/10/2013   Procedure: COLONOSCOPY;  Surgeon: Inda Castle, MD;  Location: [facility];  Service: Endoscopy;  Laterality: N/A;  . FRACTURE SURGERY    . INCISION AND DRAINAGE ABSCESS Left    foot "under my little toe"  . KNEE LIGAMENT RECONSTRUCTION Right   . LEFT HEART CATHETERIZATION WITH CORONARY ANGIOGRAM N/A 08/08/2012    Procedure: LEFT HEART CATHETERIZATION WITH CORONARY ANGIOGRAM;  Surgeon: [name], MD;  Location: [facility];  Service: Cardiovascular;  Laterality: N/A;  . NASAL SINUS SURGERY    . PERCUTANEOUS CORONARY STENT INTERVENTION (PCI-S) N/A 08/21/2012   Procedure: PERCUTANEOUS CORONARY STENT INTERVENTION (PCI-S);  Surgeon: [name], MD;  Location: [facility] CATH LAB;  Service: Cardiovascular;  Laterality: N/A;  . TUBAL LIGATION       Social History   Socioeconomic History  . Marital status: Single    Spouse name: Not on file  . Number of children: 4  . Years of education: 31 th  . Highest education level: Not on file  Occupational History  . Occupation: Retired- Engineer, production    Employer: RETIRED  Social Needs  . Financial resource strain: Not on file  . Food insecurity    Worry: Not on file    Inability: Not on file  . Transportation needs    Medical: Not on file    Non-medical: Not on file  Tobacco Use  . Smoking status: Former Smoker    Packs/day: 3.00    Years: 40.00    Pack years: 120.00    Types: Cigarettes    Start date: 07/05/1960    Quit date: 07/05/1998    Years since quitting: 20.6  . Smokeless tobacco: Never Used  Substance and Sexual Activity  . Alcohol use: No    Alcohol/week: 0.0 standard drinks    Comment: havent drank in over 40 years  . Drug use: No  . Sexual activity: Never  Lifestyle  . Physical activity    Days per week: Not on file    Minutes per session: Not on file  . Stress: Not on file  Relationships  . Social Herbalist on phone: Not on file    Gets together: Not on file    Attends religious service: Not on file    Active member of club or organization: Not on file    Attends meetings of clubs or organizations: Not on file    Relationship status: Not on file  . Intimate partner violence    Fear of current or ex partner: Not on file    Emotionally abused: Not on file    Physically abused: Not on file     Forced sexual activity: Not on file  Other Topics Concern  . Not on file  Social History Narrative   Health Care POA:    Emergency Contact: daughter, Lorenso Courier [phone] or [name] [phone]   End of Life Plan: Does not want to be ventilated or feeding tubes.    Who lives with you: Lives alone   Any pets: none   Diet: Patient reports enjoying and eating junk food.  Does not regulate types of food and currently her dentures are broken so it is hard to eat some foods.   Exercise: Patient does not have an exercise  plan.   Seatbelts: Patient reports wearing seatbelt when in vehicle.   Sun Exposure/Protection: Patient reports not going outside very often.   Hobbies: Patient enjoys reading the bible and watching game shows.    Right-handed.   1 cup caffeine per day.   Former smoker-stopped 2000   Alcohol none     Family History  Problem Relation Age of Onset  . Heart disease Mother   . Diabetes Mother   . Clotting disorder Mother   . Pneumonia Father   . Rheum arthritis Father   . Diabetes Sister   . Diabetes Sister   . Asthma Brother   . Cancer Brother   . Kidney disease Brother   . Lupus Son   . Heart disease Son   . Heart disease Daughter   . Colon cancer Neg Hx      Current Outpatient Medications on File Prior to Visit  Medication Sig Dispense Refill  . acetaminophen (TYLENOL) 325 MG tablet Take 650 mg by mouth every 6 (six) hours as needed for mild pain or moderate pain.    Marland Kitchen albuterol (PROAIR HFA) 108 (90 Base) MCG/ACT inhaler inhale 2 puffs by mouth every 4 hours if needed USE ONLY IF YOU ARE WHEEZING 1 Inhaler 0  . allopurinol (ZYLOPRIM) 100 MG tablet Take 1 tablet (100 mg total) by mouth daily. 90 tablet 3  . amLODipine (NORVASC) 10 MG tablet Take 10 mg by mouth daily.    . Ascorbic Acid (VITAMIN C PO) Take 1 tablet by mouth daily.    Marland Kitchen aspirin EC 81 MG tablet Take 81 mg by mouth daily.     Marland Kitchen atorvastatin (LIPITOR) 20 MG tablet TAKE 1 TABLET BY MOUTH DAILY 30  tablet 0  . B Complex Vitamins (VITAMIN B COMPLEX) TABS Take 1 tablet by mouth daily.    . Cholecalciferol (VITAMIN D3) 5000 UNITS CAPS Take 5,000 Units by mouth daily.    . ferrous sulfate 325 (65 FE) MG tablet Take 325 mg by mouth daily with breakfast.    . fluticasone (FLOVENT HFA) 110 MCG/ACT inhaler Inhale 2 puffs into the lungs 2 (two) times daily. 3 Inhaler 3  . furosemide (LASIX) 40 MG tablet Take 40 mg by mouth daily.     Marland Kitchen gabapentin (NEURONTIN) 300 MG capsule TAKE 1 CAPSULE BY MOUTH TWICE DAILY 60 capsule 3  . HUMULIN N KWIKPEN 100 UNIT/ML Kiwkpen ADMINISTER 30 UNITS UNDER THE SKIN EVERY MORNING 30 mL 3  . KLOR-CON M10 10 MEQ tablet TAKE 1 TABLET BY MOUTH TWICE DAILY WITH LASIX 60 tablet 3  . levETIRAcetam (KEPPRA) 500 MG tablet Take 1 tablet (500 mg total) by mouth 2 (two) times daily. 180 tablet 3  . metoprolol succinate (TOPROL-XL) 50 MG 24 hr tablet Take 50 mg by mouth daily. Take with or immediately following a meal.    . montelukast (SINGULAIR) 10 MG tablet Take 1 tablet (10 mg total) by mouth at bedtime. 30 tablet 0  . Multiple Vitamin (MULTIVITAMIN WITH MINERALS) TABS tablet Take 1 tablet by mouth daily.    . nitroGLYCERIN (NITROSTAT) 0.4 MG SL tablet Place 1 tablet (0.4 mg total) under the tongue every 5 (five) minutes as needed for chest pain. 90 tablet 3  . Olopatadine HCl (PAZEO) 0.7 % SOLN Apply 1 drop to eye daily. 1 Bottle 2  . pantoprazole (PROTONIX) 40 MG tablet TAKE 1 TABLET(40 MG) BY MOUTH DAILY 90 tablet 3  . ACCU-CHEK SOFTCLIX LANCETS lancets Use as instructed 100 each  3  . Insulin Pen Needle (B-D ULTRAFINE III SHORT PEN) 31G X 8 MM MISC CHECK BLOOD SUGAR 3 TIMES A DAY 100 each 3  . isosorbide mononitrate (IMDUR) 30 MG 24 hr tablet Take 1 tablet (30 mg total) by mouth daily. (Patient not taking: Reported on 02/28/2019) 90 tablet 3   No current facility-administered medications on file prior to visit.     Cardiovascular studies:  EKG 01/29/2019: Sinus rhyth 58  bpm. Left axis deviation. First degree A-V block Low voltage.  Old inferior infarct No change compared to previous EKG's  Echocardiogram 12/14/2017: - Left ventricle: The cavity size was normal. Systolic function was  mildly reduced. The estimated ejection fraction was in the range  of 45% to 50%. Akinesis of the inferior myocardium. Features are  consistent with a pseudonormal left ventricular filling pattern,  with concomitant abnormal relaxation and increased filling  pressure (grade 2 diastolic dysfunction). - Mitral valve: Mildly thickened leaflets . Chordal calcification.  There was mild regurgitation. Valve area by pressure half-time: 1.47 cm. - Left atrium: The atrium was mildly dilated. - Right atrium: The atrium was mildly dilated. - Pericardium, extracardiac: Trace pericardial effusion was  identified. - Significant improvement in LVEF compared to previous outpatient   echocardiogram in 2018.  Coronary angiography 07/01/2015: 1. Findings consistent with nonischemic cardiomyopathy, LVEF 25-30% with global hypokinesis. 2. Previously placed 2.5 mm DES in the PDA branch of the right coronary artery in 2014 is widely patent. Mid RCA has a 50% stenosis, FFR 0.84, not hemodynamically significant. 3. Diffusely diseased left coronary arteries, especially involving the distal LAD and circumflex coronary artery, may be the etiology for her angina pectoris.   Recent labs: Results for SALMA, WALROND (MRN [id number]) as of 01/18/2019 21:31  Ref. Range 12/15/2017 09:23  BASIC METABOLIC PANEL Unknown Rpt (A)  Sodium Latest Ref Range: 135 - 145 mmol/L 143  Potassium Latest Ref Range: 3.5 - 5.1 mmol/L 3.6  Chloride Latest Ref Range: 101 - 111 mmol/L 100 (L)  CO2 Latest Ref Range: 22 - 32 mmol/L 31  Glucose Latest Ref Range: 65 - 99 mg/dL 106 (H)  BUN Latest Ref Range: 6 - 20 mg/dL 16  Creatinine Latest Ref Range: 0.44 - 1.00 mg/dL 1.47 (H)  Calcium Latest Ref Range: 8.9 - 10.3 mg/dL 8.9  Anion  gap Latest Ref Range: 5 - 15  12  GFR, Est Non African American Latest Ref Range: >60 mL/min 33 (L)  GFR, Est African American Latest Ref Range: >60 mL/min 38 (L)    Review of Systems  Constitution: Negative for decreased appetite, malaise/fatigue, weight gain and weight loss.  HENT: Negative for congestion.   Eyes: Negative for visual disturbance.  Cardiovascular: Positive for chest pain. Negative for dyspnea on exertion, leg swelling, palpitations and syncope.  Respiratory: Negative for cough.   Endocrine: Negative for cold intolerance.  Hematologic/Lymphatic: Does not bruise/bleed easily.  Skin: Negative for itching and rash.       Rash on face  Musculoskeletal: Positive for joint pain. Negative for myalgias.  Gastrointestinal: Negative for abdominal pain, nausea and vomiting.  Genitourinary: Negative for dysuria.  Neurological: Negative for dizziness and weakness.  Psychiatric/Behavioral: The patient is not nervous/anxious.   All other systems reviewed and are negative.       Vitals not available.     Objective:    Physical Exam  Not performed. Telephone visit.       Assessment & Recommendations:   78 year old African-American female with nonischemic  cardiomyopathy, CAD (PCI RCA), obesity, hypertension, type II diabetes mellitus, morbid obesity, peripheral neuropathy, lupus.  Chest pain: No recurrent episodes at this time even without Imdur. Likely musculoskeletal pain.  CAD: Stable. Continue Aspirin, metoprolol, statin.  Fatigue: I suspect this may be related to OSA. Recommend f/u w/PCP.  Lupus: Recent worsening of malar rash, and joint pain. Recommend f/u w/PCP.  F/u in 6 months.  Nigel Mormon, MD [facility] Pager: [phone] Office: [phone] If no answer Cell [phone]

## 2019-02-28 NOTE — Telephone Encounter (Signed)
The patient had an appointment today with her cardiologist and he suggested the she follow up with her PCP.   She wants an in office appointment, I scheduled her for March 14, 2019 but she would like an earlier appointment in office. She needs a 3 day in advance notice for her transportation.  Please advise

## 2019-03-02 NOTE — Telephone Encounter (Signed)
Patient will keep 03/14/2019 appointment with PCP. Nothing further needed at this time.

## 2019-03-03 ENCOUNTER — Other Ambulatory Visit: Payer: Self-pay | Admitting: Family Medicine

## 2019-03-14 ENCOUNTER — Encounter: Payer: Self-pay | Admitting: Family Medicine

## 2019-03-14 ENCOUNTER — Ambulatory Visit (INDEPENDENT_AMBULATORY_CARE_PROVIDER_SITE_OTHER): Payer: Medicare Other | Admitting: Family Medicine

## 2019-03-14 ENCOUNTER — Telehealth: Payer: Self-pay | Admitting: Family Medicine

## 2019-03-14 VITALS — BP 120/80 | HR 63 | Temp 98.4°F | Ht 63.0 in | Wt 280.6 lb

## 2019-03-14 DIAGNOSIS — Z1239 Encounter for other screening for malignant neoplasm of breast: Secondary | ICD-10-CM

## 2019-03-14 DIAGNOSIS — M329 Systemic lupus erythematosus, unspecified: Secondary | ICD-10-CM

## 2019-03-14 DIAGNOSIS — J309 Allergic rhinitis, unspecified: Secondary | ICD-10-CM

## 2019-03-14 DIAGNOSIS — E1140 Type 2 diabetes mellitus with diabetic neuropathy, unspecified: Secondary | ICD-10-CM

## 2019-03-14 DIAGNOSIS — Z0000 Encounter for general adult medical examination without abnormal findings: Secondary | ICD-10-CM | POA: Diagnosis not present

## 2019-03-14 DIAGNOSIS — E875 Hyperkalemia: Secondary | ICD-10-CM

## 2019-03-14 DIAGNOSIS — M797 Fibromyalgia: Secondary | ICD-10-CM

## 2019-03-14 DIAGNOSIS — E559 Vitamin D deficiency, unspecified: Secondary | ICD-10-CM

## 2019-03-14 DIAGNOSIS — Z23 Encounter for immunization: Secondary | ICD-10-CM | POA: Diagnosis not present

## 2019-03-14 LAB — COMPREHENSIVE METABOLIC PANEL
ALT: 14 U/L (ref 0–35)
AST: 21 U/L (ref 0–37)
Albumin: 3.5 g/dL (ref 3.5–5.2)
Alkaline Phosphatase: 129 U/L — ABNORMAL HIGH (ref 39–117)
BUN: 40 mg/dL — ABNORMAL HIGH (ref 6–23)
CO2: 29 mEq/L (ref 19–32)
Calcium: 9.4 mg/dL (ref 8.4–10.5)
Chloride: 109 mEq/L (ref 96–112)
Creatinine, Ser: 1.87 mg/dL — ABNORMAL HIGH (ref 0.40–1.20)
GFR: 31.4 mL/min — ABNORMAL LOW (ref >60.00)
Glucose, Bld: 84 mg/dL (ref 70–99)
Potassium: 5.2 mEq/L — ABNORMAL HIGH (ref 3.5–5.1)
Sodium: 143 mEq/L (ref 135–145)
Total Bilirubin: 0.5 mg/dL (ref 0.2–1.2)
Total Protein: 7.9 g/dL (ref 6.0–8.3)

## 2019-03-14 LAB — HEMOGLOBIN A1C: Hgb A1c MFr Bld: 6.5 % (ref 4.6–6.5)

## 2019-03-14 LAB — MICROALBUMIN / CREATININE URINE RATIO
Creatinine,U: 51.2 mg/dL
Microalb Creat Ratio: 1.4 mg/g (ref 0.0–30.0)
Microalb, Ur: 0.7 mg/dL (ref 0.0–1.9)

## 2019-03-14 MED ORDER — MONTELUKAST SODIUM 10 MG PO TABS: 10.0000 mg | ORAL_TABLET | Freq: Every day | ORAL | 3 refills | Status: DC

## 2019-03-14 NOTE — Telephone Encounter (Signed)
Patient's daughter is requesting a call back on things that was discussed during the visit but not resolved :  -She needs the name of the insulin medication for Brittany Roberts -information about getting the CPAP machine  [phone]

## 2019-03-14 NOTE — Patient Instructions (Addendum)
   Ms. Lofton , Thank you for taking time to come for your Medicare Wellness Visit. I appreciate your ongoing commitment to your health goals. Please review the following plan we discussed and let me know if I can assist you in the future.   These are the goals we discussed: Goals    . Do chair exercises during the [employer] is [employer] every day    . Eat 4-5 vegetables a day    . Exercise 150 min/wk Moderate Activity     Keep getting up and down and walking around the home    . HEMOGLOBIN A1C < 8    . Weight (lb) < 249 lb (112.9 kg) (pt-stated)     7 % weight loss       This is a list of the screening recommended for you and due dates:  Health Maintenance  Topic Date Due  . Flu Shot  02/03/2019  . Hemoglobin A1C  02/09/2019  . Tetanus Vaccine  03/16/2019*  . Complete foot exam   03/16/2019  . Eye exam for diabetics  06/22/2019  . DEXA scan (bone density measurement)  Completed  . Pneumonia vaccines  Completed  *Topic was postponed. The date shown is not the original due date.    A few tips:  -As we age balance is not as good as it was, so there is a higher risks for falls. Please remove small rugs and furniture that is "in your way" and could increase the risk of falls. Stretching exercises may help with fall prevention: Yoga and Tai Chi are some examples. Low impact exercise is better, so you are not very achy the next day.  -Sun screen and avoidance of direct sun light recommended. Caution with dehydration, if working outdoors be sure to drink enough fluids.  - Some medications are not safe as we age, increases the risk of side effects and can potentially interact with other medication you are also taken;  including some of over the counter medications. Be sure to let me know when you start a new medication even if it is a dietary/vitamin supplement.   -Healthy diet low in red meet/animal fat and sugar + regular physical activity is recommended.       Screening schedule for  the next 5-10 years:  Glaucoma screening/eye exam every 1-2 years. Flu vaccine annually.  Fall prevention   Advance directives:  Please see a lawyer and/or go to this website to help you with advanced directives and designating a health care power of attorney so that your wishes will be followed should you become too ill to make your own medical decisions.  [email or web address]   Controlled type 2 diabetes with neuropathy ([place])  Need for immunization against influenza - Plan: Flu Vaccine QUAD High Dose(Fluad)  Allergic rhinitis, unspecified seasonality, unspecified trigger - Plan: montelukast (SINGULAIR) 10 MG tablet  Breast cancer screening - Plan: MM 3D SCREEN BREAST BILATERAL   Please be sure medication list is accurate. If a new problem present, please set up appointment sooner than planned today.          Lantus, Toujeo,Basaglar,or levemir.

## 2019-03-14 NOTE — Progress Notes (Signed)
HPI:   [name] is a 77 y.o. female, who is here today with her daughter for chronic disease management. She is usually by herself during OV, she has been here with her other daughter. Daughter has a few questions about medication.  She lives alone. Her daughter and granddaughter lived close by,granddaughter checks on her daily and helps her with some ADL's and IADL"s. She does not drive.  She has an aid through [facility], 3 hours 5 ties per week.  Recently she followed with neurologist,Dr Delice Lesch, Hx of epilepsia on Keppra.   HTN:  + CKD III. Since her last OV,she has seen her cardiologist and neurologist. + CAD and CHF. S/P stent placement.  Last seen by cardiologist 02/28/19.  New medication was added , Imdur 30 mg daily because having intermittent episodes of chest pain. She is also on Metoprolol , not sure if she is taking succinate or tartrate 50 mg daily. Still on Amlodipine 10 mg daily.  Component     Latest Ref Rng & Units 12/15/2017          Sodium     135 - 145 mEq/L 143  Potassium     3.5 - 5.1 mEq/L 3.6  Chloride     96 - 112 mEq/L 100 (L)  CO2     19 - 32 mEq/L 31  Glucose     70 - 99 mg/dL 106 (H)  BUN     6 - 23 mg/dL 16  Creatinine     0.40 - 1.20 mg/dL 1.47 (H)  Calcium     8.4 - 10.5 mg/dL 8.9  GFR, Est Non African American     >60 mL/min 33 (L)  GFR, Est African American     >60 mL/min 38 (L)  Anion gap     5 - 15 12   She has had CP intermittently for a while. It happens at rest,it is not associated with stress. Mid chest and it is not radiated. Denies associated diaphoresis,palpitations,or dyspnea.  Denies severe/frequent headache, visual changes,focal weakness, or worsening edema.  She has Hx of SOB exacerbated with exertion. + Heartburn. She is on Protonix 40 mg daily. She was on Ranitidine until it was taken from the market.   DM II: HgA1C 6.6 in 08/2018. Denies abdominal pain, nausea,vomiting, polydipsia,polyuria, or  polyphagia. She is on Humolin N 30 U daily. Denies episodes of hypoglycemia.   Also requesting referral to rheumatologist. According to pt,it was recommended by her cardiologist but instructed to ask PCP for referral.  She has Hx of lupus. She was referred to rheumatologist in 02/2017 but she did not keep appt. Dry ,scaly skin,mainly scalp. Arthralgias and back pain.  Fibromyalgia,she is on Gabapentin 300 mg bid,which she is tolerating well.  Daughter is also requesting order for mammogram. States that 2 years ago she was c/o right breast being bigger than left and was having some discomfort. She is not longer having any problem. Denies masses,nipple discharge,or skin changes.  Also requesting refills on Singulair 10 mg,which she has not taken for 1-2 months. Asthma/COPD, she is on Albuterol inh and Flovent 110 mc bid,not sure if taking the latter one. + Non productive cough,requesting medications for this.States that she ahs ahd problem for "long long time." Her daughter does not think she is coughing more than usual. No fever,chills,unusual fatigue,sore throat, wheezing. + Rhinorrhea and nasal congestion.   Review of Systems  Constitutional: Positive for fatigue (No more than usual.).  Negative for chills and fever.  HENT: Negative for nosebleeds, sinus pain and trouble swallowing.   Eyes: Negative for pain and discharge.  Respiratory: Positive for cough. Negative for chest tightness and stridor.   Gastrointestinal: Negative for blood in stool.       No changes in bowel habits.  Endocrine: Negative for cold intolerance and heat intolerance.  Genitourinary: Negative for decreased urine volume, dysuria and hematuria.  Musculoskeletal: Positive for arthralgias, back pain, gait problem and myalgias.  Allergic/Immunologic: Positive for environmental allergies.  Neurological: Negative for seizures, syncope and facial asymmetry.  Psychiatric/Behavioral: Negative for confusion. The  patient is nervous/anxious.   Rest see pertinent positives and negatives per HPI.   Current Outpatient Medications on File Prior to Visit  Medication Sig Dispense Refill  . acetaminophen (TYLENOL) 325 MG tablet Take 650 mg by mouth every 6 (six) hours as needed for mild pain or moderate pain.    Marland Kitchen albuterol (PROAIR HFA) 108 (90 Base) MCG/ACT inhaler inhale 2 puffs by mouth every 4 hours if needed USE ONLY IF YOU ARE WHEEZING 1 Inhaler 0  . allopurinol (ZYLOPRIM) 100 MG tablet Take 1 tablet (100 mg total) by mouth daily. 90 tablet 3  . amLODipine (NORVASC) 10 MG tablet Take 10 mg by mouth daily.    . Ascorbic Acid (VITAMIN C PO) Take 1 tablet by mouth daily.    Marland Kitchen aspirin EC 81 MG tablet Take 81 mg by mouth daily.     Marland Kitchen atorvastatin (LIPITOR) 20 MG tablet TAKE 1 TABLET BY MOUTH DAILY 30 tablet 0  . B Complex Vitamins (VITAMIN B COMPLEX) TABS Take 1 tablet by mouth daily.    . Cholecalciferol (VITAMIN D3) 5000 UNITS CAPS Take 5,000 Units by mouth daily.    . ferrous sulfate 325 (65 FE) MG tablet Take 325 mg by mouth daily with breakfast.    . fluticasone (FLOVENT HFA) 110 MCG/ACT inhaler Inhale 2 puffs into the lungs 2 (two) times daily. 3 Inhaler 3  . furosemide (LASIX) 40 MG tablet Take 40 mg by mouth daily.     Marland Kitchen gabapentin (NEURONTIN) 300 MG capsule TAKE 1 CAPSULE BY MOUTH TWICE DAILY 60 capsule 3  . HUMULIN N KWIKPEN 100 UNIT/ML Kiwkpen ADMINISTER 30 UNITS UNDER THE SKIN EVERY MORNING 30 mL 3  . isosorbide mononitrate (IMDUR) 30 MG 24 hr tablet Take 1 tablet (30 mg total) by mouth daily. 90 tablet 3  . KLOR-CON M10 10 MEQ tablet TAKE 1 TABLET BY MOUTH TWICE DAILY WITH LASIX 60 tablet 3  . levETIRAcetam (KEPPRA) 500 MG tablet Take 1 tablet (500 mg total) by mouth 2 (two) times daily. 180 tablet 3  . metoprolol succinate (TOPROL-XL) 50 MG 24 hr tablet Take 50 mg by mouth daily. Take with or immediately following a meal.    . metoprolol tartrate (LOPRESSOR) 50 MG tablet TAKE 1 TABLET BY MOUTH  EVERY DAY WITH OR IMMEDIATELY FOLLOWING A MEAL FOR HYPERRTENSION 90 tablet 0  . Multiple Vitamin (MULTIVITAMIN WITH MINERALS) TABS tablet Take 1 tablet by mouth daily.    . nitroGLYCERIN (NITROSTAT) 0.4 MG SL tablet Place 1 tablet (0.4 mg total) under the tongue every 5 (five) minutes as needed for chest pain. 90 tablet 3  . pantoprazole (PROTONIX) 40 MG tablet TAKE 1 TABLET(40 MG) BY MOUTH DAILY 90 tablet 3   No current facility-administered medications on file prior to visit.      Past Medical History:  Diagnosis Date  . Acute delirium 01/14/2017  .  Anxiety   . Arthritis    "all over"  . Asthma   . Bipolar disorder ([place])   . Complication of anesthesia    "w/right foot OR they gave me too much and they couldn't get me woke"  . Congestive heart failure, unspecified   . Coronary artery calcification seen on CAT scan 01/14/2017  . Coronary artery disease    "I've got 1 stent" (06/30/2015)  . Depression   . Discoid lupus   . Fibromyalgia    "I've been told I have this; can't take Lyrica cause I'm allergic to it" (06/30/2015)  . GERD (gastroesophageal reflux disease)   . History of gout   . History of hiatal hernia    "real bad" (06/30/2015)  . Hypertension   . Hypothyroidism   . On home oxygen therapy    "2L q hs" (06/30/2015)  . OSA (obstructive sleep apnea)    "just use my oxygen; no mask" (06/30/2015)  . Other and unspecified hyperlipidemia   . Penetrating foot wound    left nonhealing foot wound on the dorsal surface  . Peripheral neuropathy   . Pneumonia "many of times"  . Refusal of blood transfusions as patient is Jehovah's Witness   . Renal failure, unspecified    "kidneys not working 100%" (06/30/2015)  . Sickle cell trait ([place])   . Systemic lupus ([place])   . Thoracic aortic atherosclerosis ([place]) 01/14/2017  . Type II diabetes mellitus ([place])   . Unspecified vitamin D deficiency    Allergies  Allergen Reactions  . Ace Inhibitors Other (See Comments)    Coincided  with sig bump in creat. Retried and creat bumped again. Coincided with sig bump in creat. Retried and creat bumped again. Coincided with sig bump in creat. Retried and creat bumped again. Other reaction(s): Other (See Comments) Coincided with sig bump in creat. Retried and creat bumped again. Coincided with sig bump in creat. Retried and creat bumped again.  . Isosorb Dinitrate-Hydralazine Other (See Comments)    Sleep all the time. Sleep all the time. Other reaction(s): Other (See Comments) Sleep all the time.  Marland Kitchen Penicillins Anaphylaxis, Swelling and Rash    Has patient had a PCN reaction causing immediate rash, facial/tongue/throat swelling, SOB or lightheadedness with hypotension: Yes Has patient had a PCN reaction causing severe rash involving mucus membranes or skin necrosis: Yes Has patient had a PCN reaction that required hospitalization: Yes Has patient had a PCN reaction occurring within the last 10 years: No If all of the above answers are "NO", then may proceed with Cephalosporin use.   Marland Kitchen Buspirone Other (See Comments)    pain pain pain Other reaction(s): Other (See Comments) pain pain  . Pregabalin Swelling  . Ropinirole Hydrochloride Swelling  . [place] Rash    "Swelling of the tongue"    Social History   Socioeconomic History  . Marital status: Single    Spouse name: Not on file  . Number of children: 4  . Years of education: 66 th  . Highest education level: Not on file  Occupational History  . Occupation: Retired- Engineer, production    Employer: RETIRED  Social Needs  . Financial resource strain: Not on file  . Food insecurity    Worry: Not on file    Inability: Not on file  . Transportation needs    Medical: Not on file    Non-medical: Not on file  Tobacco Use  . Smoking status: Former Smoker  Packs/day: 3.00    Years: 40.00    Pack years: 120.00    Types: Cigarettes    Start date: 07/05/1960    Quit date: 07/05/1998    Years since  quitting: 20.7  . Smokeless tobacco: Never Used  Substance and Sexual Activity  . Alcohol use: No    Alcohol/week: 0.0 standard drinks    Comment: havent drank in over 40 years  . Drug use: No  . Sexual activity: Never  Lifestyle  . Physical activity    Days per week: Not on file    Minutes per session: Not on file  . Stress: Not on file  Relationships  . Social Herbalist on phone: Not on file    Gets together: Not on file    Attends religious service: Not on file    Active member of club or organization: Not on file    Attends meetings of clubs or organizations: Not on file    Relationship status: Not on file  Other Topics Concern  . Not on file  Social History Narrative   Health Care POA:    Emergency Contact: daughter, Lorenso Courier [phone] or [name] [phone]   End of Life Plan: Does not want to be ventilated or feeding tubes.    Who lives with you: Lives alone   Any pets: none   Diet: Patient reports enjoying and eating junk food.  Does not regulate types of food and currently her dentures are broken so it is hard to eat some foods.   Exercise: Patient does not have an exercise plan.   Seatbelts: Patient reports wearing seatbelt when in vehicle.   Sun Exposure/Protection: Patient reports not going outside very often.   Hobbies: Patient enjoys reading the bible and watching game shows.    Right-handed.   1 cup caffeine per day.   Former smoker-stopped 2000   Alcohol none    Vitals:   03/14/19 1149  BP: 120/80  Pulse: 63  Temp: 98.4 F (36.9 C)  SpO2: 98%   Body mass index is 49.71 kg/m.    Physical Exam  Nursing note and vitals reviewed. Constitutional: She is oriented to person, place, and time. She appears well-developed. No distress.  HENT:  Head: Normocephalic and atraumatic.  Mouth/Throat: Oropharynx is clear and moist and mucous membranes are normal.  Edentulous.  Eyes: Pupils are equal, round, and reactive to light. Conjunctivae  are normal.  Cardiovascular: Normal rate and regular rhythm.  No murmur heard. Pulses:      Dorsalis pedis pulses are 2+ on the right side and 2+ on the left side.  Respiratory: Effort normal and breath sounds normal. No respiratory distress.  GI: Soft. She exhibits no mass. There is no hepatomegaly. There is abdominal tenderness. There is no rigidity and no guarding.    Musculoskeletal:        General: No edema.  Lymphadenopathy:    She has no cervical adenopathy.  Neurological: She is alert and oriented to person, place, and time. She has normal strength. No cranial nerve deficit.  Gait assisted by a walker.  Skin: Skin is warm. No rash noted. No erythema.  Psychiatric: Her mood appears anxious. Her affect is blunt.  Fairly groomed, good eye contact.    ASSESSMENT AND PLAN:  [name] was seen today for annual exam and follow-up.  Diagnoses and all orders for this visit:  Lab Results  Component Value Date   HGBA1C 6.5 03/14/2019   Lab Results  Component Value Date   CREATININE 1.87 (H) 03/14/2019   BUN 40 (H) 03/14/2019   NA 143 03/14/2019   K 5.2 (H) 03/14/2019   CL 109 03/14/2019   CO2 29 03/14/2019   Lab Results  Component Value Date   MICROALBUR 0.7 03/14/2019   Lab Results  Component Value Date   ALT 14 03/14/2019   AST 21 03/14/2019   ALKPHOS 129 (H) 03/14/2019   BILITOT 0.5 03/14/2019    Routine general medical examination at a health care facility We discussed the importance of regular physical activity as tolerated and healthy diet for prevention of chronic illness and/or complications. Preventive guidelines reviewed. Vaccination up to date. Ca++ and vit D supplementation recommended. Next CPE in a year.  Need for immunization against influenza -     Flu Vaccine QUAD High Dose(Fluad)  Allergic rhinitis, unspecified seasonality, unspecified trigger Resume Singulair 10 mg daily. Saline nasal irrigations may also helped.  -     montelukast  (SINGULAIR) 10 MG tablet; Take 1 tablet (10 mg total) by mouth at bedtime.  Controlled type 2 diabetes with neuropathy (HCC) HgA1C at goal. No changes in current management. Healthy diet with avoidance of added sugar food intake is an important part of treatment and recommended. Annual eye exam and foot care recommended. F/U in 5-6 months  -     Comprehensive metabolic panel -     Hemoglobin A1c -     Fructosamine -     Microalbumin / creatinine urine ratio  Breast cancer screening -     MM 3D SCREEN BREAST BILATERAL; Future  Hx of systemic lupus erythematosus (SLE) ([place]) -     Ambulatory referral to Rheumatology  Fibromyalgia No chnages in Gabapentin. Rheuma referral placed.  Other orders -     Accu-Chek Softclix Lancets lancets; Use as instructed -     Insulin Pen Needle (B-D ULTRAFINE III SHORT PEN) 31G X 8 MM MISC; CHECK BLOOD SUGAR 3 TIMES A DAY   Return in about 6 months (around 09/11/2019) for DM II,HTN.   -[name] was advised to return sooner than planned today if new concerns arise.       Mateusz Neilan G. [name], MD  [facility] [place] office.

## 2019-03-14 NOTE — Telephone Encounter (Signed)
Please advise 

## 2019-03-15 DIAGNOSIS — I10 Essential (primary) hypertension: Secondary | ICD-10-CM | POA: Diagnosis not present

## 2019-03-15 DIAGNOSIS — M25869 Other specified joint disorders, unspecified knee: Secondary | ICD-10-CM | POA: Diagnosis not present

## 2019-03-15 DIAGNOSIS — J45909 Unspecified asthma, uncomplicated: Secondary | ICD-10-CM | POA: Diagnosis not present

## 2019-03-15 MED ORDER — ACCU-CHEK SOFTCLIX LANCETS MISC: 3 refills | Status: DC

## 2019-03-15 MED ORDER — BD PEN NEEDLE SHORT U/F 31G X 8 MM MISC: 3 refills | Status: DC

## 2019-03-15 NOTE — Telephone Encounter (Signed)
What about CPAP information and referral to cardio?  Please advsie

## 2019-03-15 NOTE — Telephone Encounter (Signed)
Humulin N 30 U daily. We did labs yesterday and depending on results we may need to adjust medications. Thanks, BJ

## 2019-03-16 LAB — FRUCTOSAMINE: Fructosamine: 313 umol/L — ABNORMAL HIGH (ref 205–285)

## 2019-03-16 NOTE — Telephone Encounter (Signed)
We did not address OSA during her last visit. She follows with cardiologist and neurologist. Can you please ask her who he is signing for her CPAP supplies or managing OSA. Thanks, BJ

## 2019-03-21 NOTE — Telephone Encounter (Signed)
Called but pt answered and was confused about what was needed. Will call pt daughter to see if questions were answered.

## 2019-03-23 NOTE — Telephone Encounter (Signed)
Spoke with [name] and gave recommendations per Dr. [name]. [name] verbalized understanding.

## 2019-03-27 ENCOUNTER — Other Ambulatory Visit: Payer: Self-pay | Admitting: *Deleted

## 2019-03-27 DIAGNOSIS — E1149 Type 2 diabetes mellitus with other diabetic neurological complication: Secondary | ICD-10-CM

## 2019-03-27 MED ORDER — ATORVASTATIN CALCIUM 20 MG PO TABS: 20.0000 mg | ORAL_TABLET | Freq: Every day | ORAL | 2 refills | Status: DC

## 2019-03-27 MED ORDER — ACCU-CHEK AVIVA PLUS VI STRP: ORAL_STRIP | 3 refills | Status: DC

## 2019-04-05 ENCOUNTER — Ambulatory Visit (INDEPENDENT_AMBULATORY_CARE_PROVIDER_SITE_OTHER): Payer: Medicare Other | Admitting: Neurology

## 2019-04-05 ENCOUNTER — Other Ambulatory Visit: Payer: Self-pay

## 2019-04-05 ENCOUNTER — Encounter: Payer: Self-pay | Admitting: Neurology

## 2019-04-05 VITALS — BP 128/72 | HR 68 | Temp 97.8°F | Ht 63.0 in | Wt 282.5 lb

## 2019-04-05 DIAGNOSIS — I639 Cerebral infarction, unspecified: Secondary | ICD-10-CM | POA: Diagnosis not present

## 2019-04-05 DIAGNOSIS — R569 Unspecified convulsions: Secondary | ICD-10-CM

## 2019-04-05 DIAGNOSIS — G40209 Localization-related (focal) (partial) symptomatic epilepsy and epileptic syndromes with complex partial seizures, not intractable, without status epilepticus: Secondary | ICD-10-CM

## 2019-04-05 DIAGNOSIS — R413 Other amnesia: Secondary | ICD-10-CM | POA: Diagnosis not present

## 2019-04-05 DIAGNOSIS — G4733 Obstructive sleep apnea (adult) (pediatric): Secondary | ICD-10-CM | POA: Diagnosis not present

## 2019-04-05 MED ORDER — MEMANTINE HCL 10 MG PO TABS: 10.0000 mg | ORAL_TABLET | Freq: Two times a day (BID) | ORAL | 11 refills | Status: DC

## 2019-04-05 NOTE — Progress Notes (Signed)
PATIENT: Brittany Roberts DOB: 02/01/1941  Chief Complaint  Patient presents with  . Seizures    She is here with her daughter, Brittany Roberts.  She had one seizure in 2017 and was placed on Keppra 500mg  BID.  She has not had any other events.   . Memory Loss    MMSE 30/30 - 3 animals.  She is concerned about a decline her her memory.   Marland Kitchen PCP    [name], [name], MD     HISTORICAL  [name] is a 78 year old female, seen in request by her primary care physician Dr. [name], Betty for evaluation of seizure, memory loss, initial evaluation was April 05, 2019.  I have reviewed and summarized the referring note from the referring physician.  She has past medical history of hyperlipidemia, diabetes, hypertension, obesity, previous history of stroke, she also had a history of congestive heart failure, schizophrenia. She reported seizure in July 2018, but was not able to give detailed description of the event, I was able to review previous neurologist Dr. Amparo Bristol evaluation on April 26, 2019, there was a witnessed seizure activity at emergency room, had deviated to the left side, followed by confusion, agitation, and somnolence.  She was treated with Keppra 500 mg twice daily, there was no recurrent seizure since.  MRI of the brain at that time showed right frontal encephalomalacia.  Had no recurrent seizure since then.  She graduated from [employer] school, retired from Psychologist, educational job, lives at home, noticed gradual onset memory loss since 2019, tends to repeat herself, has moment of word finding difficulties, but there was no staring spells, no seizure activity,  She also complains of loud snoring, frequent awakening at nighttime, daytime sleepiness, she has history of COPD, was a longtime smoker, used nighttime oxygen since 2014.  EEG in July 2018 showed background slowing throughout the tracing, there was no epileptiform discharge.  I personally reviewed MRI of the brain July 2018, encephalomalacia  involving the right frontal lobe, mild diffuse atrophy, supratentorium small vessel disease.  Laboratory evaluations in 2020, A1c 6.6, CMP showed mild elevated creatinine 1.87, GFR of 31, CBC showed hemoglobin of 12.2,  REVIEW OF SYSTEMS: Full 14 system review of systems performed and notable only for as above All other review of systems were negative.  ALLERGIES: Allergies  Allergen Reactions  . Ace Inhibitors Other (See Comments)    Coincided with sig bump in creat. Retried and creat bumped again. Coincided with sig bump in creat. Retried and creat bumped again. Coincided with sig bump in creat. Retried and creat bumped again. Other reaction(s): Other (See Comments) Coincided with sig bump in creat. Retried and creat bumped again. Coincided with sig bump in creat. Retried and creat bumped again.  . Isosorb Dinitrate-Hydralazine Other (See Comments)    Sleep all the time. Sleep all the time. Other reaction(s): Other (See Comments) Sleep all the time.  Marland Kitchen Penicillins Anaphylaxis, Swelling and Rash    Has patient had a PCN reaction causing immediate rash, facial/tongue/throat swelling, SOB or lightheadedness with hypotension: Yes Has patient had a PCN reaction causing severe rash involving mucus membranes or skin necrosis: Yes Has patient had a PCN reaction that required hospitalization: Yes Has patient had a PCN reaction occurring within the last 10 years: No If all of the above answers are "NO", then may proceed with Cephalosporin use.   Marland Kitchen Buspirone Other (See Comments)    pain pain pain Other reaction(s): Other (See Comments) pain pain  .  Pregabalin Swelling  . Ropinirole Hydrochloride Swelling  . Amantadines Rash    "Swelling of the tongue"    HOME MEDICATIONS: Current Outpatient Medications  Medication Sig Dispense Refill  . Accu-Chek Softclix Lancets lancets Use as instructed 100 each 3  . acetaminophen (TYLENOL) 325 MG tablet Take 650 mg by mouth every 6 (six) hours  as needed for mild pain or moderate pain.    Marland Kitchen albuterol (PROAIR HFA) 108 (90 Base) MCG/ACT inhaler inhale 2 puffs by mouth every 4 hours if needed USE ONLY IF YOU ARE WHEEZING 1 Inhaler 0  . allopurinol (ZYLOPRIM) 100 MG tablet Take 1 tablet (100 mg total) by mouth daily. 90 tablet 3  . amLODipine (NORVASC) 10 MG tablet Take 10 mg by mouth daily.    . Ascorbic Acid (VITAMIN C PO) Take 1 tablet by mouth daily.    Marland Kitchen aspirin EC 81 MG tablet Take 81 mg by mouth daily.     Marland Kitchen atorvastatin (LIPITOR) 20 MG tablet Take 1 tablet (20 mg total) by mouth daily. 90 tablet 2  . B Complex Vitamins (VITAMIN B COMPLEX) TABS Take 1 tablet by mouth daily.    . Cholecalciferol (VITAMIN D3) 5000 UNITS CAPS Take 5,000 Units by mouth daily.    . ferrous sulfate 325 (65 FE) MG tablet Take 325 mg by mouth daily with breakfast.    . fluticasone (FLOVENT HFA) 110 MCG/ACT inhaler Inhale 2 puffs into the lungs 2 (two) times daily. 3 Inhaler 3  . furosemide (LASIX) 40 MG tablet Take 40 mg by mouth daily.     Marland Kitchen gabapentin (NEURONTIN) 300 MG capsule TAKE 1 CAPSULE BY MOUTH TWICE DAILY 60 capsule 3  . glucose blood (ACCU-CHEK AVIVA PLUS) test strip Use to test blood sugar three times daily 100 each 3  . HUMULIN N KWIKPEN 100 UNIT/ML Kiwkpen ADMINISTER 30 UNITS UNDER THE SKIN EVERY MORNING 30 mL 3  . Insulin Pen Needle (B-D ULTRAFINE III SHORT PEN) 31G X 8 MM MISC CHECK BLOOD SUGAR 3 TIMES A DAY 100 each 3  . isosorbide mononitrate (IMDUR) 30 MG 24 hr tablet Take 1 tablet (30 mg total) by mouth daily. 90 tablet 3  . KLOR-CON M10 10 MEQ tablet TAKE 1 TABLET BY MOUTH TWICE DAILY WITH LASIX 60 tablet 3  . levETIRAcetam (KEPPRA) 500 MG tablet Take 1 tablet (500 mg total) by mouth 2 (two) times daily. 180 tablet 3  . metoprolol succinate (TOPROL-XL) 50 MG 24 hr tablet Take 50 mg by mouth daily. Take with or immediately following a meal.    . metoprolol tartrate (LOPRESSOR) 50 MG tablet TAKE 1 TABLET BY MOUTH EVERY DAY WITH OR  IMMEDIATELY FOLLOWING A MEAL FOR HYPERRTENSION 90 tablet 0  . montelukast (SINGULAIR) 10 MG tablet Take 1 tablet (10 mg total) by mouth at bedtime. 90 tablet 3  . Multiple Vitamin (MULTIVITAMIN WITH MINERALS) TABS tablet Take 1 tablet by mouth daily.    . nitroGLYCERIN (NITROSTAT) 0.4 MG SL tablet Place 1 tablet (0.4 mg total) under the tongue every 5 (five) minutes as needed for chest pain. 90 tablet 3  . pantoprazole (PROTONIX) 40 MG tablet TAKE 1 TABLET(40 MG) BY MOUTH DAILY 90 tablet 3   No current facility-administered medications for this visit.     PAST MEDICAL HISTORY: Past Medical History:  Diagnosis Date  . Acute delirium 01/14/2017  . Anxiety   . Arthritis    "all over"  . Asthma   . Bipolar disorder ([place])   .  Complication of anesthesia    "w/right foot OR they gave me too much and they couldn't get me woke"  . Congestive heart failure, unspecified   . Coronary artery calcification seen on CAT scan 01/14/2017  . Coronary artery disease    "I've got 1 stent" (06/30/2015)  . Depression   . Discoid lupus   . Fibromyalgia    "I've been told I have this; can't take Lyrica cause I'm allergic to it" (06/30/2015)  . GERD (gastroesophageal reflux disease)   . History of gout   . History of hiatal hernia    "real bad" (06/30/2015)  . Hypertension   . Hypothyroidism   . Memory loss   . On home oxygen therapy    "2L q hs" (06/30/2015)  . OSA (obstructive sleep apnea)    "just use my oxygen; no mask" (06/30/2015)  . Other and unspecified hyperlipidemia   . Penetrating foot wound    left nonhealing foot wound on the dorsal surface  . Peripheral neuropathy   . Pneumonia "many of times"  . Refusal of blood transfusions as patient is Jehovah's Witness   . Renal failure, unspecified    "kidneys not working 100%" (06/30/2015)  . Seizure ([place])   . Sickle cell trait ([place])   . Systemic lupus ([place])   . Thoracic aortic atherosclerosis ([place]) 01/14/2017  . Type II diabetes mellitus  ([place])   . Unspecified vitamin D deficiency     PAST SURGICAL HISTORY: Past Surgical History:  Procedure Laterality Date  . ANKLE ARTHROSCOPY WITH OPEN REDUCTION INTERNAL FIXATION (ORIF) Right 03/2011  . CARDIAC CATHETERIZATION N/A 07/01/2015   Procedure: Left Heart Cath and Coronary Angiography;  Surgeon: Adrian Prows, MD;  Location: [facility];  Service: Cardiovascular;  Laterality: N/A;  . CARDIAC CATHETERIZATION N/A 07/01/2015   Procedure: Intravascular Pressure Wire/FFR Study;  Surgeon: Adrian Prows, MD;  Location: [facility];  Service: Cardiovascular;  Laterality: N/A;  . CARPAL TUNNEL RELEASE Bilateral   . CATARACT EXTRACTION W/ INTRAOCULAR LENS  IMPLANT, BILATERAL Bilateral   . CHOLECYSTECTOMY OPEN    . COLONOSCOPY N/A 12/10/2013   Procedure: COLONOSCOPY;  Surgeon: Inda Castle, MD;  Location: [facility];  Service: Endoscopy;  Laterality: N/A;  . FRACTURE SURGERY    . INCISION AND DRAINAGE ABSCESS Left    foot "under my little toe"  . KNEE LIGAMENT RECONSTRUCTION Right   . LEFT HEART CATHETERIZATION WITH CORONARY ANGIOGRAM N/A 08/08/2012   Procedure: LEFT HEART CATHETERIZATION WITH CORONARY ANGIOGRAM;  Surgeon: [name], MD;  Location: [facility] CATH LAB;  Service: Cardiovascular;  Laterality: N/A;  . NASAL SINUS SURGERY    . PERCUTANEOUS CORONARY STENT INTERVENTION (PCI-S) N/A 08/21/2012   Procedure: PERCUTANEOUS CORONARY STENT INTERVENTION (PCI-S);  Surgeon: [name], MD;  Location: [facility] CATH LAB;  Service: Cardiovascular;  Laterality: N/A;  . TUBAL LIGATION      FAMILY HISTORY: Family History  Problem Relation Age of Onset  . Heart disease Mother   . Diabetes Mother   . Clotting disorder Mother   . Pneumonia Father   . Rheum arthritis Father   . Diabetes Sister   . Diabetes Sister   . Asthma Brother   . Cancer Brother   . Kidney disease Brother   . Lupus Son   . Heart disease Son   . Heart disease Daughter   . Colon cancer Neg Hx     SOCIAL  HISTORY: Social History   Socioeconomic History  .  Marital status: Single    Spouse name: Not on file  . Number of children: 4  . Years of education: 25 th  . Highest education level: Not on file  Occupational History  . Occupation: Retired- Engineer, production    Employer: RETIRED  Social Needs  . Financial resource strain: Not on file  . Food insecurity    Worry: Not on file    Inability: Not on file  . Transportation needs    Medical: Not on file    Non-medical: Not on file  Tobacco Use  . Smoking status: Former Smoker    Packs/day: 3.00    Years: 40.00    Pack years: 120.00    Types: Cigarettes    Start date: 07/05/1960    Quit date: 07/05/1998    Years since quitting: 20.7  . Smokeless tobacco: Never Used  Substance and Sexual Activity  . Alcohol use: No    Alcohol/week: 0.0 standard drinks    Comment: havent drank in over 40 years  . Drug use: No  . Sexual activity: Never  Lifestyle  . Physical activity    Days per week: Not on file    Minutes per session: Not on file  . Stress: Not on file  Relationships  . Social Herbalist on phone: Not on file    Gets together: Not on file    Attends religious service: Not on file    Active member of club or organization: Not on file    Attends meetings of clubs or organizations: Not on file    Relationship status: Not on file  . Intimate partner violence    Fear of current or ex partner: Not on file    Emotionally abused: Not on file    Physically abused: Not on file    Forced sexual activity: Not on file  Other Topics Concern  . Not on file  Social History Narrative   Health Care POA:    Emergency Contact: daughter, Lorenso Courier [phone] or [name] [phone]   End of Life Plan: Does not want to be ventilated or feeding tubes.    Who lives with you: Lives alone   Any pets: none   Diet: Patient reports enjoying and eating junk food.  Does not regulate types of food and currently her dentures are  broken so it is hard to eat some foods.   Exercise: Patient does not have an exercise plan.   Seatbelts: Patient reports wearing seatbelt when in vehicle.   Sun Exposure/Protection: Patient reports not going outside very often.   Hobbies: Patient enjoys reading the bible and watching game shows.    Right-handed.   1 cup caffeine per day.   Former smoker-stopped 2000   Alcohol none     PHYSICAL EXAM   Vitals:   04/05/19 0957  BP: 128/72  Pulse: 68  Temp: 97.8 F (36.6 C)  Weight: 282 lb 8 oz (128.1 kg)  Height: 5\' 3"  (1.6 m)    Not recorded      Body mass index is 50.04 kg/m.  PHYSICAL EXAMNIATION:  Gen: NAD, conversant, well nourised, well groomed                     Cardiovascular: Regular rate rhythm, no peripheral edema, warm, nontender. Eyes: Conjunctivae clear without exudates or hemorrhage Neck: Supple, no carotid bruits. Pulmonary: Clear to auscultation bilaterally   NEUROLOGICAL EXAM:  MENTAL STATUS: Speech:    Speech  is normal; fluent and spontaneous with normal comprehension.  Cognition:     Orientation to time, place and person     Normal recent and remote memory     Normal Attention span and concentration     Normal Language, naming, repeating,spontaneous speech     Fund of knowledge   CRANIAL NERVES: CN II: Visual fields are full to confrontation.  Pupils are round equal and briskly reactive to light. CN III, IV, VI: extraocular movement are normal. No ptosis. CN V: Facial sensation is intact to pinprick in all 3 divisions bilaterally. Corneal responses are intact.  CN VII: Face is symmetric with normal eye closure and smile. CN VIII: Hearing is normal to causal conversation. CN IX, X: Palate elevates symmetrically. Phonation is normal. CN XI: Head turning and shoulder shrug are intact CN XII: Tongue is midline with normal movements and no atrophy.  MOTOR: There is no pronator drift of out-stretched arms. Muscle bulk and tone are normal.  Muscle strength is normal.  REFLEXES: Reflexes are 1 and symmetric at the biceps, triceps, knees, and ankles. Plantar responses are flexor.  SENSORY: Intact to light touch, pinprick, positional sensation and vibratory sensation are intact in fingers and toes.  COORDINATION: Rapid alternating movements and fine finger movements are intact. There is no dysmetria on finger-to-nose and heel-knee-shin.    GAIT/STANCE: She needs push-up to get up from seated position, rely on her walker, cautious, unsteady   DIAGNOSTIC DATA (LABS, IMAGING, TESTING) - I reviewed patient records, labs, notes, testing and imaging myself where available.   ASSESSMENT AND PLAN  KHAMANI DANIELY is a 78 y.o. female   Right frontal stroke Seizure Memory loss Obstructive sleep apnea  EEG  Refer her to sleep study  Keep current dose of Keppra 500 mg twice a day  Laboratory evaluation to rule out treatable cause for her memory loss.   Marcial Pacas, M.D. Ph.D.  [facility] [address], [place], [place] [address] Ph: [phone] Fax: [phone]  CC: [name], [name], MD

## 2019-04-06 LAB — HIV ANTIBODY (ROUTINE TESTING W REFLEX): HIV Screen 4th Generation wRfx: NONREACTIVE

## 2019-04-06 LAB — TSH: TSH: 1.21 u[IU]/mL (ref 0.450–4.500)

## 2019-04-06 LAB — FOLATE: Folate: 14 ng/mL (ref >3.0)

## 2019-04-06 LAB — VITAMIN B12: Vitamin B-12: >2000 pg/mL — ABNORMAL HIGH (ref 232–1245)

## 2019-04-06 LAB — RPR: RPR Ser Ql: NONREACTIVE

## 2019-04-09 ENCOUNTER — Telehealth: Payer: Self-pay | Admitting: *Deleted

## 2019-04-09 NOTE — Telephone Encounter (Signed)
I was able to speak the patient.  She is aware of her lab results.

## 2019-04-09 NOTE — Telephone Encounter (Signed)
-----   Message from Marcial Pacas, MD sent at 04/09/2019 11:48 AM EDT ----- Please call patient for normal laboratory result

## 2019-04-14 DIAGNOSIS — M25869 Other specified joint disorders, unspecified knee: Secondary | ICD-10-CM | POA: Diagnosis not present

## 2019-04-14 DIAGNOSIS — J45909 Unspecified asthma, uncomplicated: Secondary | ICD-10-CM | POA: Diagnosis not present

## 2019-04-14 DIAGNOSIS — I10 Essential (primary) hypertension: Secondary | ICD-10-CM | POA: Diagnosis not present

## 2019-04-18 ENCOUNTER — Encounter: Payer: Self-pay | Admitting: Neurology

## 2019-04-18 ENCOUNTER — Ambulatory Visit (INDEPENDENT_AMBULATORY_CARE_PROVIDER_SITE_OTHER): Payer: Medicare Other | Admitting: Neurology

## 2019-04-18 ENCOUNTER — Other Ambulatory Visit: Payer: Self-pay

## 2019-04-18 VITALS — BP 124/70 | HR 59 | Ht 63.0 in | Wt 280.0 lb

## 2019-04-18 DIAGNOSIS — Z9981 Dependence on supplemental oxygen: Secondary | ICD-10-CM

## 2019-04-18 DIAGNOSIS — G4719 Other hypersomnia: Secondary | ICD-10-CM | POA: Diagnosis not present

## 2019-04-18 DIAGNOSIS — R569 Unspecified convulsions: Secondary | ICD-10-CM

## 2019-04-18 DIAGNOSIS — G4733 Obstructive sleep apnea (adult) (pediatric): Secondary | ICD-10-CM | POA: Diagnosis not present

## 2019-04-18 DIAGNOSIS — Z6841 Body Mass Index (BMI) 40.0 and over, adult: Secondary | ICD-10-CM

## 2019-04-18 NOTE — Progress Notes (Signed)
Subjective:    Patient ID: Brittany Roberts is a 78 y.o. female.  HPI     Star Age, MD, PhD [facility] [address]O. [place], [place] [address]  Dear Aliene Beams,   I saw your patient, Brittany Roberts, upon your kind request to my sleep clinic today for initial consultation of her sleep disorder, in particular, concern for underlying obstructive sleep apnea.  The patient is accompanied by her daughter today.  As you know, Brittany Roberts is a 78 year old right-handed woman with an underlying complex medical history of type 2 diabetes, lupus, sickle cell trait, history of seizures, pneumonia, neuropathy, memory loss, hypertension, gout, reflux disease, depression, coronary artery disease, status post stent placement, hypothyroidism, mood disorder, asthma, arthritis, congestive heart failure and morbid obesity with a BMI of over 61, who was previously diagnosed with obstructive sleep apnea but has not been on CPAP therapy.  She was placed on oxygen at night. Prior sleep study results are not available for my review.  I reviewed your office note from 04/05/2019.  Her Epworth sleepiness score is 15 out of 24, fatigue severity score is 42 out of 63.  She reports using oxygen via oxygen concentrator at night at 3 L/min.  She would be willing to get retested with a sleep study.  She reports that she had 3 or 4 sleep studies in the past.  I could not pull up any previous test results.  She has a brother who uses CPAP.  She lives alone, she has no set bedtime but goes to bed generally somewhere between 10 and 12, rise time between 7 and 8.  She has a history of asthma, she quit smoking some 20 years ago.  She drinks caffeine in the form of coffee occasionally and soda occasionally.  She does not currently drink alcohol.  She denies nocturia or morning headaches.  She has gained weight over time.  Her Past Medical History Is Significant For: Past Medical History:  Diagnosis Date  .  Acute delirium 01/14/2017  . Anxiety   . Arthritis    "all over"  . Asthma   . Bipolar disorder ([place])   . Complication of anesthesia    "w/right foot OR they gave me too much and they couldn't get me woke"  . Congestive heart failure, unspecified   . Coronary artery calcification seen on CAT scan 01/14/2017  . Coronary artery disease    "I've got 1 stent" (06/30/2015)  . Depression   . Discoid lupus   . Fibromyalgia    "I've been told I have this; can't take Lyrica cause I'm allergic to it" (06/30/2015)  . GERD (gastroesophageal reflux disease)   . History of gout   . History of hiatal hernia    "real bad" (06/30/2015)  . Hypertension   . Hypothyroidism   . Memory loss   . On home oxygen therapy    "2L q hs" (06/30/2015)  . OSA (obstructive sleep apnea)    "just use my oxygen; no mask" (06/30/2015)  . Other and unspecified hyperlipidemia   . Penetrating foot wound    left nonhealing foot wound on the dorsal surface  . Peripheral neuropathy   . Pneumonia "many of times"  . Refusal of blood transfusions as patient is Jehovah's Witness   . Renal failure, unspecified    "kidneys not working 100%" (06/30/2015)  . Seizure ([place])   . Sickle cell trait ([place])   . Systemic lupus ([place])   .  Thoracic aortic atherosclerosis ([place]) 01/14/2017  . Type II diabetes mellitus ([place])   . Unspecified vitamin D deficiency     Her Past Surgical History Is Significant For: Past Surgical History:  Procedure Laterality Date  . ANKLE ARTHROSCOPY WITH OPEN REDUCTION INTERNAL FIXATION (ORIF) Right 03/2011  . CARDIAC CATHETERIZATION N/A 07/01/2015   Procedure: Left Heart Cath and Coronary Angiography;  Surgeon: Adrian Prows, MD;  Location: [place] [facility];  Service: Cardiovascular;  Laterality: N/A;  . CARDIAC CATHETERIZATION N/A 07/01/2015   Procedure: Intravascular Pressure Wire/FFR Study;  Surgeon: Adrian Prows, MD;  Location: [place] [facility];  Service: Cardiovascular;  Laterality: N/A;  . CARPAL  TUNNEL RELEASE Bilateral   . CATARACT EXTRACTION W/ INTRAOCULAR LENS  IMPLANT, BILATERAL Bilateral   . CHOLECYSTECTOMY OPEN    . COLONOSCOPY N/A 12/10/2013   Procedure: COLONOSCOPY;  Surgeon: Inda Castle, MD;  Location: [facility];  Service: Endoscopy;  Laterality: N/A;  . FRACTURE SURGERY    . INCISION AND DRAINAGE ABSCESS Left    foot "under my little toe"  . KNEE LIGAMENT RECONSTRUCTION Right   . LEFT HEART CATHETERIZATION WITH CORONARY ANGIOGRAM N/A 08/08/2012   Procedure: LEFT HEART CATHETERIZATION WITH CORONARY ANGIOGRAM;  Surgeon: [name], MD;  Location[facility];  Service: Cardiovascular;  Laterality: N/A;  . NASAL SINUS SURGERY    . PERCUTANEOUS CORONARY STENT INTERVENTION (PCI-S) N/A 08/21/2012   Procedure: PERCUTANEOUS CORONARY STENT INTERVENTION (PCI-S);  Surgeon: [name], MD;  Location: [facility] CATH LAB;  Service: Cardiovascular;  Laterality: N/A;  . TUBAL LIGATION      Her Family History Is Significant For: Family History  Problem Relation Age of Onset  . Heart disease Mother   . Diabetes Mother   . Clotting disorder Mother   . Pneumonia Father   . Rheum arthritis Father   . Diabetes Sister   . Diabetes Sister   . Asthma Brother   . Cancer Brother   . Kidney disease Brother   . Lupus Son   . Heart disease Son   . Heart disease Daughter   . Colon cancer Neg Hx     Her Social History Is Significant For: Social History   Socioeconomic History  . Marital status: Single    Spouse name: Not on file  . Number of children: 4  . Years of education: 59 th  . Highest education level: Not on file  Occupational History  . Occupation: Retired- Engineer, production    Employer: RETIRED  Social Needs  . Financial resource strain: Not on file  . Food insecurity    Worry: Not on file    Inability: Not on file  . Transportation needs    Medical: Not on file    Non-medical: Not on file  Tobacco Use  . Smoking status: Former Smoker     Packs/day: 3.00    Years: 40.00    Pack years: 120.00    Types: Cigarettes    Start date: 07/05/1960    Quit date: 07/05/1998    Years since quitting: 20.8  . Smokeless tobacco: Never Used  Substance and Sexual Activity  . Alcohol use: No    Alcohol/week: 0.0 standard drinks    Comment: havent drank in over 40 years  . Drug use: No  . Sexual activity: Never  Lifestyle  . Physical activity    Days per week: Not on file    Minutes per session: Not on file  . Stress:  Not on file  Relationships  . Social Herbalist on phone: Not on file    Gets together: Not on file    Attends religious service: Not on file    Active member of club or organization: Not on file    Attends meetings of clubs or organizations: Not on file    Relationship status: Not on file  Other Topics Concern  . Not on file  Social History Narrative   Health Care POA:    Emergency Contact: daughter, Brittany Roberts [phone] or Brittany Roberts [phone]   End of Life Plan: Does not want to be ventilated or feeding tubes.    Who lives with you: Lives alone   Any pets: none   Diet: Patient reports enjoying and eating junk food.  Does not regulate types of food and currently her dentures are broken so it is hard to eat some foods.   Exercise: Patient does not have an exercise plan.   Seatbelts: Patient reports wearing seatbelt when in vehicle.   Sun Exposure/Protection: Patient reports not going outside very often.   Hobbies: Patient enjoys reading the bible and watching game shows.    Right-handed.   1 cup caffeine per day.   Former smoker-stopped 2000   Alcohol none    Her Allergies Are:  Allergies  Allergen Reactions  . Ace Inhibitors Other (See Comments)    Coincided with sig bump in creat. Retried and creat bumped again. Coincided with sig bump in creat. Retried and creat bumped again. Coincided with sig bump in creat. Retried and creat bumped again. Other reaction(s): Other (See Comments) Coincided  with sig bump in creat. Retried and creat bumped again. Coincided with sig bump in creat. Retried and creat bumped again.  . Isosorb Dinitrate-Hydralazine Other (See Comments)    Sleep all the time. Sleep all the time. Other reaction(s): Other (See Comments) Sleep all the time.  Marland Kitchen Penicillins Anaphylaxis, Swelling and Rash    Has patient had a PCN reaction causing immediate rash, facial/tongue/throat swelling, SOB or lightheadedness with hypotension: Yes Has patient had a PCN reaction causing severe rash involving mucus membranes or skin necrosis: Yes Has patient had a PCN reaction that required hospitalization: Yes Has patient had a PCN reaction occurring within the last 10 years: No If all of the above answers are "NO", then may proceed with Cephalosporin use.   Marland Kitchen Buspirone Other (See Comments)    pain pain pain Other reaction(s): Other (See Comments) pain pain  . Pregabalin Swelling  . Ropinirole Hydrochloride Swelling  . Amantadines Rash    "Swelling of the tongue"  :   Her Current Medications Are:  Outpatient Encounter Medications as of 04/18/2019  Medication Sig  . Accu-Chek Softclix Lancets lancets Use as instructed  . acetaminophen (TYLENOL) 325 MG tablet Take 650 mg by mouth every 6 (six) hours as needed for mild pain or moderate pain.  Marland Kitchen albuterol (PROAIR HFA) 108 (90 Base) MCG/ACT inhaler inhale 2 puffs by mouth every 4 hours if needed USE ONLY IF YOU ARE WHEEZING  . allopurinol (ZYLOPRIM) 100 MG tablet Take 1 tablet (100 mg total) by mouth daily.  Marland Kitchen amLODipine (NORVASC) 10 MG tablet Take 10 mg by mouth daily.  . Ascorbic Acid (VITAMIN C PO) Take 1 tablet by mouth daily.  Marland Kitchen aspirin EC 81 MG tablet Take 81 mg by mouth daily.   Marland Kitchen atorvastatin (LIPITOR) 20 MG tablet Take 1 tablet (20 mg total) by mouth daily.  Marland Kitchen B  Complex Vitamins (VITAMIN B COMPLEX) TABS Take 1 tablet by mouth daily.  . Cholecalciferol (VITAMIN D3) 5000 UNITS CAPS Take 5,000 Units by mouth daily.  .  ferrous sulfate 325 (65 FE) MG tablet Take 325 mg by mouth daily with breakfast.  . fluticasone (FLOVENT HFA) 110 MCG/ACT inhaler Inhale 2 puffs into the lungs 2 (two) times daily.  . furosemide (LASIX) 40 MG tablet Take 40 mg by mouth daily.   Marland Kitchen gabapentin (NEURONTIN) 300 MG capsule TAKE 1 CAPSULE BY MOUTH TWICE DAILY  . glucose blood (ACCU-CHEK AVIVA PLUS) test strip Use to test blood sugar three times daily  . HUMULIN N KWIKPEN 100 UNIT/ML Kiwkpen ADMINISTER 30 UNITS UNDER THE SKIN EVERY MORNING  . Insulin Pen Needle (B-D ULTRAFINE III SHORT PEN) 31G X 8 MM MISC CHECK BLOOD SUGAR 3 TIMES A DAY  . isosorbide mononitrate (IMDUR) 30 MG 24 hr tablet Take 1 tablet (30 mg total) by mouth daily.  Marland Kitchen KLOR-CON M10 10 MEQ tablet TAKE 1 TABLET BY MOUTH TWICE DAILY WITH LASIX  . levETIRAcetam (KEPPRA) 500 MG tablet Take 1 tablet (500 mg total) by mouth 2 (two) times daily.  . memantine (NAMENDA) 10 MG tablet Take 1 tablet (10 mg total) by mouth 2 (two) times daily.  . metoprolol succinate (TOPROL-XL) 50 MG 24 hr tablet Take 50 mg by mouth daily. Take with or immediately following a meal.  . metoprolol tartrate (LOPRESSOR) 50 MG tablet TAKE 1 TABLET BY MOUTH EVERY DAY WITH OR IMMEDIATELY FOLLOWING A MEAL FOR HYPERRTENSION  . montelukast (SINGULAIR) 10 MG tablet Take 1 tablet (10 mg total) by mouth at bedtime.  . Multiple Vitamin (MULTIVITAMIN WITH MINERALS) TABS tablet Take 1 tablet by mouth daily.  . nitroGLYCERIN (NITROSTAT) 0.4 MG SL tablet Place 1 tablet (0.4 mg total) under the tongue every 5 (five) minutes as needed for chest pain.  . pantoprazole (PROTONIX) 40 MG tablet TAKE 1 TABLET(40 MG) BY MOUTH DAILY   No facility-administered encounter medications on file as of 04/18/2019.   :  Review of Systems:  Out of a complete 14 point review of systems, all are reviewed and negative with the exception of these symptoms as listed below:   Review of Systems  Neurological:       Pt presents today to  discuss her sleep. Pt has had sleep studies in the past but has not been on a cpap. Pt thinks that she snores.  Epworth Sleepiness Scale 0= would never doze 1= slight chance of dozing 2= moderate chance of dozing 3= high chance of dozing  Sitting and reading: 3 Watching TV: 3 Sitting inactive in a public place (ex. Theater or meeting): 1 As a passenger in a car for an hour without a break: 2 Lying down to rest in the afternoon: 1 Sitting and talking to someone: 2 Sitting quietly after lunch (no alcohol): 3 In a car, while stopped in traffic: 0 Total: 15     Objective:  Neurological Exam  Physical Exam Physical Examination:   Vitals:   04/18/19 1103  BP: 124/70  Pulse: (!) 59    General Examination: The patient is a very pleasant 78 y.o. female in no acute distress. She appears well-developed and well-nourished and well groomed.   HEENT: Normocephalic, atraumatic, pupils are equal, round and reactive to light, She is status post bilateral cataract repairs.  She has corrective eyeglasses but these are not up-to-date, she reports that she lost her newer glasses.  She is slightly  hard of hearing, she has bilateral hearing aids.  Extraocular tracking is preserved, face is symmetric with normal facial animation.  Speech is edentulous but otherwise not dysarthric.  Airway examination reveals edentulous state, moderate airway crowding secondary to tonsillar size of 1+, small airway entry, redundant soft palate and wider tongue.  She has moderate mouth dryness.  Tongue protrudes centrally in palate elevates symmetrically. There are no carotid bruits on auscultation.   Chest: Clear to auscultation without wheezing, rhonchi or crackles noted.  Heart: S1+S2+0, regular and normal without murmurs, rubs or gallops noted.   Abdomen: Soft, non-tender and non-distended with normal bowel sounds appreciated on auscultation.  Extremities: There is no pitting edema in the distal lower  extremities bilaterally.  Skin: Warm and dry without trophic changes noted. There are no varicose veins.  Musculoskeletal: exam reveals no obvious joint deformities, tenderness or joint swelling or erythema.   Neurologically:  Mental status: The patient is awake, alert and oriented in all 4 spheres. Her immediate and remote memory, attention, language skills and fund of knowledge are appropriate. There is no evidence of aphasia, agnosia, apraxia or anomia. Speech is clear with normal prosody and enunciation. Thought process is linear. Mood is normal and affect is normal.  Cranial nerves II - XII are as described above under HEENT exam. In addition: shoulder shrug is normal with equal shoulder height noted. Motor exam: Normal bulk, strength and tone is noted. There is no tremor. Fine motor skills and coordination: grossly intact.  Cerebellar testing: No dysmetria or intention tremor. There is no truncal or gait ataxia.  Sensory exam: intact to light touch in the upper and lower extremities.  Gait, station and balance: She stands with difficulty and pushes herself up.  She stands wide-based.  She walks with a rolling walker.  She maneuvers the walker well.  No limp, no shuffling.  Assessment and Plan:  In summary, BERLINE SEMRAD is a very pleasant 78 y.o.-year old female with an underlying complex medical history of type 2 diabetes, lupus, sickle cell trait, history of seizures, pneumonia, neuropathy, memory loss, hypertension, gout, reflux disease, depression, coronary artery disease, status post stent placement, hypothyroidism, mood disorder, asthma, arthritis, congestive heart failure and morbid obesity with a BMI of over 98, who Presents for evaluation of her prior diagnosis of obstructive sleep apnea.  She has never been on CPAP therapy and was given supplemental oxygen for overnight use many years ago.  She still uses oxygen at 3 L/min overnight via oxygen concentrator.  She would be willing to  proceed with sleep study testing and consider CPAP therapy.  I had a long chat with the patient and her daughter about my findings and the diagnosis of OSA, its prognosis and treatment options. We talked about medical treatments, surgical interventions and non-pharmacological approaches. I explained in particular the risks and ramifications of untreated moderate to severe OSA, especially with respect to developing cardiovascular disease down the Road, including congestive heart failure, difficult to treat hypertension, cardiac arrhythmias, or stroke. Even type 2 diabetes has, in part, been linked to untreated OSA. Symptoms of untreated OSA include daytime sleepiness, memory problems, mood irritability and mood disorder such as depression and anxiety, lack of energy, as well as recurrent headaches, especially morning headaches. We talked about trying to maintain a healthy lifestyle in general, as well as the importance of weight control. We also talked about the importance of good sleep hygiene. I recommended the following at this time: sleep study.  I explained the sleep test procedure to the patient and Explained the CPAP therapy to her.  She is advised that only oxygen is not standard therapy for sleep apnea.  We will start the diagnostic sleep study without supplemental oxygen and add oxygen if needed.  She is advised to try to continue to work on weight loss.  I plan to see her back after sleep study testing.  I answered all the questions today and the patient and her daughter were in agreement. Thank you very much for allowing me to participate in the care of this nice patient. If I can be of any further assistance to you please do not hesitate to talk to me.  Sincerely,   Star Age, MD, PhD

## 2019-04-18 NOTE — Patient Instructions (Signed)

## 2019-04-19 ENCOUNTER — Other Ambulatory Visit: Payer: Self-pay | Admitting: Family Medicine

## 2019-04-23 ENCOUNTER — Ambulatory Visit (INDEPENDENT_AMBULATORY_CARE_PROVIDER_SITE_OTHER): Payer: Medicare Other | Admitting: Neurology

## 2019-04-23 ENCOUNTER — Other Ambulatory Visit: Payer: Self-pay

## 2019-04-23 DIAGNOSIS — R413 Other amnesia: Secondary | ICD-10-CM

## 2019-04-23 DIAGNOSIS — R569 Unspecified convulsions: Secondary | ICD-10-CM

## 2019-04-23 DIAGNOSIS — I639 Cerebral infarction, unspecified: Secondary | ICD-10-CM

## 2019-04-23 DIAGNOSIS — G4733 Obstructive sleep apnea (adult) (pediatric): Secondary | ICD-10-CM

## 2019-04-23 NOTE — Procedures (Signed)
    History:  Brittany Roberts is a 78 year old patient with a history of COPD, lupus, diabetes, and history of seizures.  The patient is being treated with Keppra.  She is being evaluated for the seizures.  This is a routine EEG.  No skull defects are noted.  Medications include albuterol, Norvasc, vitamin C, aspirin, Lipitor, vitamin D, iron supplementation, Flovent, Lasix, gabapentin, insulin, Imdur, potassium supplementation, Keppra, Namenda, Toprol, Singulair, multivitamins, and Protonix.  EEG classification: Normal awake  Description of the recording: The background rhythms of this recording consists of a fairly well modulated medium amplitude alpha rhythm of 10 Hz that is reactive to eye opening and closure. As the record progresses, the patient appears to remain in the waking state throughout the recording. Photic stimulation was performed, resulting in a bilateral and symmetric photic driving response. Hyperventilation was also performed, resulting in a minimal buildup of the background rhythm activities without significant slowing seen. At no time during the recording does there appear to be evidence of spike or spike wave discharges or evidence of focal slowing. EKG monitor shows no evidence of cardiac rhythm abnormalities with a heart rate of 56.  Impression: This is a normal EEG recording in the waking state. No evidence of ictal or interictal discharges are seen.

## 2019-05-10 ENCOUNTER — Telehealth: Payer: Self-pay | Admitting: *Deleted

## 2019-05-10 NOTE — Telephone Encounter (Signed)
Copied from [place] [phone]. Topic: General - Other >> May 10, 2019 12:33 PM Pauline Good wrote: Reason for CRM: pt is having a gout flare up and want to know if she can increase her medication or what can be done to help her pain  Please advise

## 2019-05-14 ENCOUNTER — Encounter: Payer: Self-pay | Admitting: Family Medicine

## 2019-05-14 ENCOUNTER — Telehealth (INDEPENDENT_AMBULATORY_CARE_PROVIDER_SITE_OTHER): Payer: Medicare Other | Admitting: Family Medicine

## 2019-05-14 VITALS — BP 142/82

## 2019-05-14 DIAGNOSIS — M109 Gout, unspecified: Secondary | ICD-10-CM | POA: Diagnosis not present

## 2019-05-14 DIAGNOSIS — M159 Polyosteoarthritis, unspecified: Secondary | ICD-10-CM | POA: Diagnosis not present

## 2019-05-14 DIAGNOSIS — I10 Essential (primary) hypertension: Secondary | ICD-10-CM | POA: Diagnosis not present

## 2019-05-14 DIAGNOSIS — R39198 Other difficulties with micturition: Secondary | ICD-10-CM

## 2019-05-14 MED ORDER — DICLOFENAC SODIUM 1 % TD GEL: 4.0000 g | Freq: Four times a day (QID) | TRANSDERMAL | 2 refills | Status: DC

## 2019-05-14 MED ORDER — DULOXETINE HCL 30 MG PO CPEP: 30.0000 mg | ORAL_CAPSULE | Freq: Every day | ORAL | 1 refills | Status: DC

## 2019-05-14 NOTE — Progress Notes (Signed)
Virtual Visit via Telephone Note  I connected with ISYS TIETJE on 05/14/19 at  3:00 PM EST by telephone and verified that I am speaking with the correct person using two identifiers.   I discussed the limitations, risks, security and privacy concerns of performing an evaluation and management service by telephone and the availability of in person appointments. I also discussed with the patient that there may be a patient responsible charge related to this service. The patient expressed understanding and agreed to proceed.  Location patient: home Location provider: work office Participants present for the call: patient, provider Patient did not have a visit in the prior 7 days to address this/these issue(s).   History of Present Illness: [name] is a 78 yo female with Hx of lupus, generalized OA,fubromyalgia, DM II,CHF,HTN, CKD III,depression,dementia, and anxiety c/o joint pain,she thinks it is caused by gout. She is on Allopurinol 100 mg daily. 6 days ago she started with "bad" pain in right ankle and right knee. Gradual onset, right knee pain is constant, ankle pain is intermittent. Exacerbated by walking and standing, alleviated by rest. No Hx of trauma.  She has not noted erythema, ankle mildly edematous.  Right knee pain is constant. She has had cortisone shots, have not helped. No recent injuries.  She has been taken Tylenol q 4 hours.   Also c/o "really dry" skin, flaky  "all over." She has not noted erythematous rash or pruritus. This is a chronic problem. No tender lesions.  She has not applied OTC treatments.  She denies fever,chills,worsening fatigue, sore throat,oral lesions,cough,wheezing,or dyspnea. No abdominal pain,N/V,or changes in bowel habits.  She is also concerned about urine "coming out from one side" for a few weeks. She is afraid of having an obstruction. Denies dysuria,increased urinary frequency, gross hematuria,or decreased urine output. Denies pelvic  pain,vaginal bleeding or discharge. Occasional urgency.  BP mildly elevated. She is on Amlodipine 10 mg,Metoprolol succinate 50 mg daily,and Imdur 30 mg daily. She has not noted unusual headache,focal weakness,or worsening edema.   Observations/Objective: Patient sounds cheerful and well on the phone. I do not appreciate any SOB,cough,or stridor. Speech and thought processing are grossly intact. Very anxious. Patient reported vitals:BP (!) 142/82   Assessment and Plan:  1. OSTEOARTHROSIS, GENERALIZED, MULTIPLE SITES Due to CVD she cannot take oral NSAID's,so recommend topical diclofenac qid on affected joints. Tylenol 500 mg 3-4 tabs max per day. After discussion of some side effects she agrees with trying Cymbalta 30 mg. Wt loss will also help.  - diclofenac sodium (VOLTAREN) 1 % GEL; Apply 4 g topically 4 (four) times daily.  Dispense: 150 g; Refill: 2 - DULoxetine (CYMBALTA) 30 MG capsule; Take 1 capsule (30 mg total) by mouth daily.  Dispense: 30 capsule; Refill: 1  2. Gout, arthropathy Based on pt Hx she does not seem to be having a gout attack. No changes in Allopurinol dose. We discussed side effects of allopurinol as well as the risk of gout attack if she changes dose or d/c medication. Low purines diet to continue.  3. Essential hypertension Mildly elevated, it has been 120's/70's during prior visits. No changes in current management. Continue low salt diet.  4. Abnormal urinary stream Tried to reassure, hx does not suggest a serious process. Could be decreased urine stream due to age. Kegel exercises may help.  Instructed about warning signs.  In regard to dry skin, recommend Cetaphil cream as needed.   Follow Up Instructions:  Return in about 6 weeks (  around 06/25/2019) for OA,urine,anxiety..  I did not refer this patient for an OV in the next 24 hours for this/these issue(s).  I discussed the assessment and treatment plan with the patient. She was  provided an opportunity to ask questions and all were answered. She agreed with the plan and demonstrated an understanding of the instructions.   The patient was advised to call back or seek an in-person evaluation if the symptoms worsen or if the condition fails to improve as anticipated.  I provided 23 minutes of non-face-to-face time during this encounter.   Betty [name], MD

## 2019-05-14 NOTE — Telephone Encounter (Signed)
We do not want to adjust her gout medication during a gout attack. She also has history of generalized OA, which can also be contributing to joint pain. Please arrange appointment, which can be virtual. Thanks, BJ

## 2019-05-14 NOTE — Telephone Encounter (Signed)
Spoke with patient. Informed her of Dr. Doug Sou message. Patient verbalized understanding. Appointment made for 3PM today for telephone call.

## 2019-05-15 DIAGNOSIS — J45909 Unspecified asthma, uncomplicated: Secondary | ICD-10-CM | POA: Diagnosis not present

## 2019-05-15 DIAGNOSIS — M25869 Other specified joint disorders, unspecified knee: Secondary | ICD-10-CM | POA: Diagnosis not present

## 2019-05-15 DIAGNOSIS — I10 Essential (primary) hypertension: Secondary | ICD-10-CM | POA: Diagnosis not present

## 2019-06-09 ENCOUNTER — Other Ambulatory Visit: Payer: Self-pay

## 2019-06-09 ENCOUNTER — Ambulatory Visit (INDEPENDENT_AMBULATORY_CARE_PROVIDER_SITE_OTHER): Payer: Medicare Other | Admitting: Neurology

## 2019-06-09 DIAGNOSIS — Z6841 Body Mass Index (BMI) 40.0 and over, adult: Secondary | ICD-10-CM

## 2019-06-09 DIAGNOSIS — R0683 Snoring: Secondary | ICD-10-CM

## 2019-06-09 DIAGNOSIS — G4719 Other hypersomnia: Secondary | ICD-10-CM

## 2019-06-09 DIAGNOSIS — Z9981 Dependence on supplemental oxygen: Secondary | ICD-10-CM

## 2019-06-09 DIAGNOSIS — R569 Unspecified convulsions: Secondary | ICD-10-CM

## 2019-06-09 DIAGNOSIS — G4733 Obstructive sleep apnea (adult) (pediatric): Secondary | ICD-10-CM

## 2019-06-09 DIAGNOSIS — G4720 Circadian rhythm sleep disorder, unspecified type: Secondary | ICD-10-CM

## 2019-06-11 ENCOUNTER — Other Ambulatory Visit: Payer: Self-pay

## 2019-06-11 MED ORDER — METOPROLOL SUCCINATE ER 50 MG PO TB24: 50.0000 mg | ORAL_TABLET | Freq: Every day | ORAL | 1 refills | Status: DC

## 2019-06-14 DIAGNOSIS — J45909 Unspecified asthma, uncomplicated: Secondary | ICD-10-CM | POA: Diagnosis not present

## 2019-06-14 DIAGNOSIS — I10 Essential (primary) hypertension: Secondary | ICD-10-CM | POA: Diagnosis not present

## 2019-06-14 DIAGNOSIS — M25869 Other specified joint disorders, unspecified knee: Secondary | ICD-10-CM | POA: Diagnosis not present

## 2019-06-19 ENCOUNTER — Telehealth: Payer: Self-pay | Admitting: Family Medicine

## 2019-06-19 DIAGNOSIS — M159 Polyosteoarthritis, unspecified: Secondary | ICD-10-CM

## 2019-06-19 NOTE — Telephone Encounter (Signed)
Message Routed to PCP CMA 

## 2019-06-19 NOTE — Telephone Encounter (Signed)
Brittany Roberts at [facility] has called and is requesting this pt be called about a possibility of another Wheelchair. States that pt had one but it was picked up. FU with patient he request.

## 2019-06-19 NOTE — Telephone Encounter (Signed)
Pt returned call and stated that she would prefer to get a scooter this time instead of a wheelchair. Pt requests call back

## 2019-06-19 NOTE — Telephone Encounter (Signed)
Lmovm x 1.   PEC - okay to discuss with pt. We just need to know if she would like for [place] to order her a new wheelchair.

## 2019-06-19 NOTE — Telephone Encounter (Signed)
Lmovm x 1.

## 2019-06-20 NOTE — Telephone Encounter (Signed)
Lmovm x 2.  Pec - please let pt know that we can place an order for a scooter this time if she would rather have that.

## 2019-06-25 NOTE — Telephone Encounter (Signed)
Spoke with pt. She is okay with getting a scooter. Advised I'd place the order & they will be in contact to set up a delivery time. Pt verbalized understanding.

## 2019-06-25 NOTE — Telephone Encounter (Signed)
Order placed, message sent to [employer] to get Scooter for pt.

## 2019-07-02 ENCOUNTER — Telehealth: Payer: Self-pay | Admitting: Neurology

## 2019-07-02 ENCOUNTER — Other Ambulatory Visit: Payer: Self-pay

## 2019-07-02 DIAGNOSIS — M159 Polyosteoarthritis, unspecified: Secondary | ICD-10-CM

## 2019-07-02 MED ORDER — DULOXETINE HCL 30 MG PO CPEP: 30.0000 mg | ORAL_CAPSULE | Freq: Every day | ORAL | 1 refills | Status: DC

## 2019-07-02 NOTE — Telephone Encounter (Signed)
-----   Message from Star Age, MD sent at 07/02/2019 11:26 AM EST ----- Patient referred by Dr. Krista Blue, seen by me on 04/18/19 for prior Dx of OSA, now on O2 at home. She had a diagnostic PSG on 06/09/19 (without O2).   Please call and notify the patient that the recent sleep study did not show any significant obstructive sleep apnea with the exception of mild REM related OSA and intermittent snoring. CPAP therapy is not indicated; supplemental oxygen was not required. Weight loss and avoiding the supine sleep position will likely alleviate her snoring and mild REM-sleep related sleep apnea. She can FU with Dr. Krista Blue or her NP as scheduled. Thanks,  Star Age, MD, PhD [facility])

## 2019-07-02 NOTE — Progress Notes (Signed)
Patient referred by Dr. Krista Blue, seen by me on 04/18/19 for prior Dx of OSA, now on O2 at home. She had a diagnostic PSG on 06/09/19 (without O2).   Please call and notify the patient that the recent sleep study did not show any significant obstructive sleep apnea with the exception of mild REM related OSA and intermittent snoring. CPAP therapy is not indicated; supplemental oxygen was not required. Weight loss and avoiding the supine sleep position will likely alleviate her snoring and mild REM-sleep related sleep apnea. She can FU with Dr. Krista Blue or her NP as scheduled. Thanks,  Star Age, MD, PhD [facility])

## 2019-07-02 NOTE — Telephone Encounter (Signed)
Called the patient and advised her of the sleep study results. Informed her that her sleep study didn't indicate that sleep apnea was of concern. Advised that this study also indicated that low oxygen was not a concern that would require oxygen. Advised that since we don't order that, we can not tel her to stop that or order to dc it. Just advised her to inform the ordering MD the results. Patient encouraged to follow up with Dr Krista Blue or her NP. Pt verbalized understanding. Pt had no questions at this time but was encouraged to call back if questions arise.

## 2019-07-02 NOTE — Procedures (Signed)
PATIENT'S NAME:  Brittany Roberts, Brittany Roberts DOB:      03/19/1941      MR#:    [id number]     DATE OF RECORDING: 06/09/2019 REFERRING M.D.:  Betty [name], MD Study Performed:   Baseline Polysomnogram HISTORY: 78 year old woman with a history of type 2 diabetes, lupus, sickle cell trait, history of seizures, pneumonia, neuropathy, memory loss, hypertension, gout, reflux disease, depression, coronary artery disease, status post stent placement, hypothyroidism, mood disorder, asthma, arthritis, congestive heart failure and morbid obesity with a BMI of over 57, who was previously diagnosed with obstructive sleep apnea but has not been on CPAP therapy.  She was placed on oxygen at night. The patient endorsed the Epworth Sleepiness Scale at 15 points. The patient's weight 280 pounds with a height of 63 (inches), resulting in a BMI of 49.6 kg/m2. The patient's neck circumference measured 16.5 inches.  CURRENT MEDICATIONS: Tylenol, Albuterol, Zyloprim,, Norvasc, ASA 81mg , Lipitor, Flovent, Lasix, Neurontin, Humulin, Imdur, Klor-con, Keppra, Namenda, Toprol-XL, Lopressor, Singulair, Nitrostat, Protonix   PROCEDURE:  This is a multichannel digital polysomnogram utilizing the Somnostar 11.2 system.  Electrodes and sensors were applied and monitored per AASM Specifications.   EEG, EOG, Chin and Limb EMG, were sampled at 200 Hz.  ECG, Snore and Nasal Pressure, Thermal Airflow, Respiratory Effort, CPAP Flow and Pressure, Oximetry was sampled at 50 Hz. Digital video and audio were recorded.      BASELINE STUDY  This study was started without supplemental oxygen.  Lights Out was at 20:42 and Lights On at 04:54.  Total recording time (TRT) was 492 minutes, with a total sleep time (TST) of 379.5 minutes.   The patient's sleep latency was 49 minutes, which is delayed. REM latency was 91.5 minutes, which is normal. The sleep efficiency was 77.1 %.     SLEEP ARCHITECTURE: WASO (Wake after sleep onset) was 63 minutes with mild to moderate  sleep fragmentation noted. There were 15.5 minutes in Stage N1, 281.5 minutes Stage N2, 38 minutes Stage N3 and 44.5 minutes in Stage REM.  The percentage of Stage N1 was 4.1%, Stage N2 was 74.2%, which is markedly increased, Stage N3 was 10.%, and Stage R (REM sleep) was 11.7%, which is reduced. The arousals were noted as: 48 were spontaneous, 0 were associated with PLMs, 1 were associated with respiratory events.  RESPIRATORY ANALYSIS:  There were a total of 9 respiratory events:  0 obstructive apneas, 0 central apneas and 0 mixed apneas with a total of 0 apneas and an apnea index (AI) of 0 /hour. There were 9 hypopneas with a hypopnea index of 1.4 /hour. The patient also had 0 respiratory event related arousals (RERAs).      The total APNEA/HYPOPNEA INDEX (AHI) was 1.4/hour and the total RESPIRATORY DISTURBANCE INDEX was  1.4 /hour.  9 events occurred in REM sleep and 0 events in NREM. The REM AHI was  12.1 /hour, versus a non-REM AHI of 0. The patient spent 344 minutes of total sleep time in the supine position and 36 minutes in non-supine.. The supine AHI was 0.5 versus a non-supine AHI of 10.1.  OXYGEN SATURATION & C02:  The Wake baseline 02 saturation was 93%, with the lowest being 86%. Supplemental O2 was not given. Time spent below 89% saturation equaled 4 minutes.  PERIODIC LIMB MOVEMENTS: The patient had a total of 0 Periodic Limb Movements.  The Periodic Limb Movement (PLM) index was 0 and the PLM Arousal index was 0/hour.  Audio and video analysis did  not show any abnormal or unusual movements, behaviors, phonations or vocalizations. The patient took 1 bathroom break. Mild and intermittent snoring was noted. The EKG was in keeping with normal sinus rhythm (NSR).  Post-study, the patient indicated that sleep was the same as usual.   IMPRESSION:  1. Snoring  2. Dysfunctions associated with sleep stages or arousal from sleep  RECOMMENDATIONS:  1. This study does not demonstrate any  significant obstructive or central sleep disordered breathing with the exception of mild REM related OSA and intermittent snoring. CPAP therapy is not indicated; supplemental oxygen was not required. This study does not support an intrinsic sleep disorder as a cause of the patient's symptoms. Weight loss and avoiding the supine sleep position will likely alleviate her snoring and mild REM-sleep related sleep apnea.  2. This study shows sleep fragmentation and abnormal sleep stage percentages; these are nonspecific findings and per se do not signify an intrinsic sleep disorder or a cause for the patient's sleep-related symptoms. Causes include (but are not limited to) the first night effect of the sleep study, circadian rhythm disturbances, medication effect or an underlying mood disorder or medical problem.  3. The patient should be cautioned not to drive, work at heights, or operate dangerous or heavy equipment when tired or sleepy. Review and reiteration of good sleep hygiene measures should be pursued with any patient. 4. The patient will be advised to follow up with the referring provider, who will be notified of the test results.  I certify that I have reviewed the entire raw data recording prior to the issuance of this report in accordance with the Standards of Accreditation of the American Academy of Sleep Medicine (AASM)   Star Age, MD, PhD Diplomat, American Board of Neurology and Sleep Medicine (Neurology and Sleep Medicine)

## 2019-07-09 ENCOUNTER — Other Ambulatory Visit: Payer: Self-pay | Admitting: Cardiology

## 2019-07-15 DIAGNOSIS — M25869 Other specified joint disorders, unspecified knee: Secondary | ICD-10-CM | POA: Diagnosis not present

## 2019-07-15 DIAGNOSIS — J45909 Unspecified asthma, uncomplicated: Secondary | ICD-10-CM | POA: Diagnosis not present

## 2019-07-15 DIAGNOSIS — I10 Essential (primary) hypertension: Secondary | ICD-10-CM | POA: Diagnosis not present

## 2019-07-17 ENCOUNTER — Encounter: Payer: Self-pay | Admitting: Family Medicine

## 2019-07-17 ENCOUNTER — Ambulatory Visit (INDEPENDENT_AMBULATORY_CARE_PROVIDER_SITE_OTHER): Payer: Medicare Other

## 2019-07-17 ENCOUNTER — Telehealth (INDEPENDENT_AMBULATORY_CARE_PROVIDER_SITE_OTHER): Payer: Medicare Other | Admitting: Family Medicine

## 2019-07-17 DIAGNOSIS — Z1231 Encounter for screening mammogram for malignant neoplasm of breast: Secondary | ICD-10-CM | POA: Diagnosis not present

## 2019-07-17 DIAGNOSIS — Z0000 Encounter for general adult medical examination without abnormal findings: Secondary | ICD-10-CM | POA: Diagnosis not present

## 2019-07-17 DIAGNOSIS — J441 Chronic obstructive pulmonary disease with (acute) exacerbation: Secondary | ICD-10-CM

## 2019-07-17 DIAGNOSIS — R0602 Shortness of breath: Secondary | ICD-10-CM

## 2019-07-17 DIAGNOSIS — E1140 Type 2 diabetes mellitus with diabetic neuropathy, unspecified: Secondary | ICD-10-CM

## 2019-07-17 MED ORDER — BUDESONIDE-FORMOTEROL FUMARATE 80-4.5 MCG/ACT IN AERO: 2.0000 | INHALATION_SPRAY | Freq: Two times a day (BID) | RESPIRATORY_TRACT | 3 refills | Status: DC

## 2019-07-17 MED ORDER — AEROCHAMBER PLUS MISC: 1 refills | Status: DC

## 2019-07-17 MED ORDER — PROAIR HFA 108 (90 BASE) MCG/ACT IN AERS: INHALATION_SPRAY | RESPIRATORY_TRACT | 2 refills | Status: DC

## 2019-07-17 MED ORDER — PREDNISONE 20 MG PO TABS: 40.0000 mg | ORAL_TABLET | Freq: Every day | ORAL | 0 refills | Status: AC

## 2019-07-17 NOTE — Progress Notes (Signed)
Virtual Visit via Telephone Note  I connected with Brittany Roberts on 07/17/19 at  3:00 PM EST by telephone and verified that I am speaking with the correct person using two identifiers.   I discussed the limitations, risks, security and privacy concerns of performing an evaluation and management service by telephone and the availability of in person appointments. I also discussed with the patient that there may be a patient responsible charge related to this service. The patient expressed understanding and agreed to proceed.  Location patient: home Location provider: work office Participants present for the call: patient, provider Patient did not have a visit in the prior 7 days to address this/these issue(s).   History of Present Illness: Brittany Roberts is a 79 years old female with history of hypertension, CHF, CAD, and COPD complaining of about a month of worsening wheezing and dyspnea. Symptoms are exacerbated by exertion, today she is having some symptoms at rest.  "Very short of breath." Coughing some, "not much." Symptoms alleviated by albuterol inhaler, which she is using to 3 times daily. She is also on Flovent 110 mcg 2 puffs twice daily.  Yesterday she has mild sore throat in the morning, resolved.  Mild rhinorrhea and nasal congestion, no more than usual.  She is on Singulair 10 mg daily. No sick contact. She has not noted changes in the smell or taste.  She states that she does "not feel bad."  She has not had fever, chills, changes in appetite, CP, palpitations, orthopnea, PND, abdominal pain, N/V, urinary symptoms, or a skin rash. Negative for worsening edema.   DM2, BS has been in the 200s, usually after she drinks coffee.  She thinks her aid is added sugar to her coffee.   Lab Results  Component Value Date   HGBA1C 6.5 03/14/2019   Negative for polydipsia,polyuria, or polyphagia.   Observations/Objective: Patient sounds cheerful and well on the phone. I do not  appreciate any SOB. Mild wheezing with forced expiration. No cough or stridor. Speech and thought processing are grossly intact.+ Anxious. Patient reported vitals:N/A  Assessment and Plan: 1. Shortness of breath We discussed possible etiologies. History suggest COPD/asthma exacerbation. She does not feel like she needs to go to the ER at this time. Instructed about warning signs.   2. Chronic obstructive pulmonary disease with acute exacerbation (HCC) Recommend discontinuing Flovent. Start Symbicort 80-4.5 mcg twice daily. We discussed some side effects of prednisone, which she has tolerated in the past. Recommend prednisone 40 mg daily with breakfast for 5 days. Continue monitoring for fever.  Albuterol inh 2 puff every 6 hours for a week then as needed for wheezing or shortness of breath.  And spacer may help with better current pain medication tolerance. Instructed about warning signs. Follow-up in 7 to 10 days.  - predniSONE (DELTASONE) 20 MG tablet; Take 2 tablets (40 mg total) by mouth daily with breakfast for 5 days.  Dispense: 10 tablet; Refill: 0 - PROAIR HFA 108 (90 Base) MCG/ACT inhaler; INHALE 2 PUFF BY MOUTH EVERY 4 HOURS AS NEEDED USE ONLY IF YOU ARE WHEEZING  Dispense: 8.5 g; Refill: 2 - budesonide-formoterol (SYMBICORT) 80-4.5 MCG/ACT inhaler; Inhale 2 puffs into the lungs 2 (two) times daily.  Dispense: 1 Inhaler; Refill: 3  3. Controlled type 2 diabetes with neuropathy ([place]) Based on recent BS's problem does not seem to be well controlled. She thinks it is the sugar in her coffer and planning on continuing with Splenda.   Follow Up Instructions:  Return in about 10 days (around 07/27/2019).  I did not refer this patient for an OV in the next 24 hours for this/these issue(s).  I discussed the assessment and treatment plan with the patient. The patient was provided an opportunity to ask questions and all were answered. The patient agreed with the plan and  demonstrated an understanding of the instructions.    I provided 21-22 minutes of non-face-to-face time during this encounter.   Oswin Griffith [name], MD

## 2019-07-17 NOTE — Patient Instructions (Signed)
Brittany Roberts , Thank you for taking time to participate in your Medicare Wellness Visit. I appreciate your ongoing commitment to your health goals. Please review the following plan we discussed and let me know if I can assist you in the future.   Screening recommendations/referrals: Colorectal Screening: colonoscopy 12/10/2013; no more due to age. Mammogram: last one 04/26/2016; order sent to the [facility]. Bone Density: last one 01/20/2015. No more needed.  Vision and Dental Exams: Recommended annual ophthalmology exams for early detection of glaucoma and other disorders of the eye Recommended annual dental exams for proper oral hygiene. She is behind on these. She does complain about pain in mouth when wearing dentures and they do not fit her properly.  Diabetic Exams: Diabetic Eye Exam: 06/21/2018; past due as of 06/22/2019.  Diabetic Foot Exam: last one 03/15/2018; past due 03/16/2019.   Vaccinations: Influenza vaccine: completed 03/14/2019, due again Fall 2021. Pneumococcal vaccine: completed 02/06/215 & 02/23/2106. Up to date. Tdap vaccine: past due since 08/06/2015. Last one on 08/05/2005.  Shingles vaccine: Please call your pharmacy to determine your out of pocket expense for the Shingrix vaccine. You may receive this vaccine at your local pharmacy.  Advanced directives: Advance directives discussed with you today.  Advance directives discussed with you today. You have declined to receive documents for completion.  Goals: Recommend to drink at least 6-8 8oz glasses of water per day.  Recommend to remove any items from the home that may cause slips or trips.  Recommend to follow low sodium diet as directed below  Next appointment: Please schedule your Annual Wellness Visit with your Nurse Health Advisor in one year.  Preventive Care 80 Years and Older, Female Preventive care refers to lifestyle choices and visits with your health care provider that can promote health and  wellness. What does preventive care include?  A yearly physical exam. This is also called an annual well check.  Dental exams once or twice a year.  Routine eye exams. Ask your health care provider how often you should have your eyes checked.  Personal lifestyle choices, including:  Daily care of your teeth and gums.  Regular physical activity.  Eating a healthy diet.  Avoiding tobacco and drug use.  Limiting alcohol use.  Practicing safe sex.  Taking low-dose aspirin every day if recommended by your health care provider.  Taking vitamin and mineral supplements as recommended by your health care provider. What happens during an annual well check? The services and screenings done by your health care provider during your annual well check will depend on your age, overall health, lifestyle risk factors, and family history of disease. Counseling  Your health care provider may ask you questions about your:  Alcohol use.  Tobacco use.  Drug use.  Emotional well-being.  Home and relationship well-being.  Sexual activity.  Eating habits.  History of falls.  Memory and ability to understand (cognition).  Work and work Statistician.  Reproductive health. Screening  You may have the following tests or measurements:  Height, weight, and BMI.  Blood pressure.  Lipid and cholesterol levels. These may be checked every 5 years, or more frequently if you are over 80 years old.  Skin check.  Lung cancer screening. You may have this screening every year starting at age 80 if you have a 30-pack-year history of smoking and currently smoke or have quit within the past 15 years.  Fecal occult blood test (FOBT) of the stool. You may have this test every year  starting at age 80.  Flexible sigmoidoscopy or colonoscopy. You may have a sigmoidoscopy every 5 years or a colonoscopy every 10 years starting at age 80.  Hepatitis C blood test.  Hepatitis B blood test.  Sexually  transmitted disease (STD) testing.  Diabetes screening. This is done by checking your blood sugar (glucose) after you have not eaten for a while (fasting). You may have this done every 1-3 years.  Bone density scan. This is done to screen for osteoporosis. You may have this done starting at age 80.  Mammogram. This may be done every 1-2 years. Talk to your health care provider about how often you should have regular mammograms. Talk with your health care provider about your test results, treatment options, and if necessary, the need for more tests. Vaccines  Your health care provider may recommend certain vaccines, such as:  Influenza vaccine. This is recommended every year.  Tetanus, diphtheria, and acellular pertussis (Tdap, Td) vaccine. You may need a Td booster every 10 years.  Zoster vaccine. You may need this after age 21.  Pneumococcal 13-valent conjugate (PCV13) vaccine. One dose is recommended after age 80.  Pneumococcal polysaccharide (PPSV23) vaccine. One dose is recommended after age 80. Talk to your health care provider about which screenings and vaccines you need and how often you need them. This information is not intended to replace advice given to you by your health care provider. Make sure you discuss any questions you have with your health care provider. Document Released: 07/18/2015 Document Revised: 03/10/2016 Document Reviewed: 04/22/2015 Elsevier Interactive Patient Education  2017 [place] Prevention in the Home Falls can cause injuries. They can happen to people of all ages. There are many things you can do to make your home safe and to help prevent falls. What can I do on the outside of my home?  Regularly fix the edges of walkways and driveways and fix any cracks.  Remove anything that might make you trip as you walk through a door, such as a raised step or threshold.  Trim any bushes or trees on the path to your home.  Use bright outdoor  lighting.  Clear any walking paths of anything that might make someone trip, such as rocks or tools.  Regularly check to see if handrails are loose or broken. Make sure that both sides of any steps have handrails.  Any raised decks and porches should have guardrails on the edges.  Have any leaves, snow, or ice cleared regularly.  Use sand or salt on walking paths during winter.  Clean up any spills in your garage right away. This includes oil or grease spills. What can I do in the bathroom?  Use night lights.  Install grab bars by the toilet and in the tub and shower. Do not use towel bars as grab bars.  Use non-skid mats or decals in the tub or shower.  If you need to sit down in the shower, use a plastic, non-slip stool.  Keep the floor dry. Clean up any water that spills on the floor as soon as it happens.  Remove soap buildup in the tub or shower regularly.  Attach bath mats securely with double-sided non-slip rug tape.  Do not have throw rugs and other things on the floor that can make you trip. What can I do in the bedroom?  Use night lights.  Make sure that you have a light by your bed that is easy to reach.  Do not  use any sheets or blankets that are too big for your bed. They should not hang down onto the floor.  Have a firm chair that has side arms. You can use this for support while you get dressed.  Do not have throw rugs and other things on the floor that can make you trip. What can I do in the kitchen?  Clean up any spills right away.  Avoid walking on wet floors.  Keep items that you use a lot in easy-to-reach places.  If you need to reach something above you, use a strong step stool that has a grab bar.  Keep electrical cords out of the way.  Do not use floor polish or wax that makes floors slippery. If you must use wax, use non-skid floor wax.  Do not have throw rugs and other things on the floor that can make you trip. What can I do with my  stairs?  Do not leave any items on the stairs.  Make sure that there are handrails on both sides of the stairs and use them. Fix handrails that are broken or loose. Make sure that handrails are as long as the stairways.  Check any carpeting to make sure that it is firmly attached to the stairs. Fix any carpet that is loose or worn.  Avoid having throw rugs at the top or bottom of the stairs. If you do have throw rugs, attach them to the floor with carpet tape.  Make sure that you have a light switch at the top of the stairs and the bottom of the stairs. If you do not have them, ask someone to add them for you. What else can I do to help prevent falls?  Wear shoes that:  Do not have high heels.  Have rubber bottoms.  Are comfortable and fit you well.  Are closed at the toe. Do not wear sandals.  If you use a stepladder:  Make sure that it is fully opened. Do not climb a closed stepladder.  Make sure that both sides of the stepladder are locked into place.  Ask someone to hold it for you, if possible.  Clearly mark and make sure that you can see:  Any grab bars or handrails.  First and last steps.  Where the edge of each step is.  Use tools that help you move around (mobility aids) if they are needed. These include:  Canes.  Walkers.  Scooters.  Crutches.  Turn on the lights when you go into a dark area. Replace any light bulbs as soon as they burn out.  Set up your furniture so you have a clear path. Avoid moving your furniture around.  If any of your floors are uneven, fix them.  If there are any pets around you, be aware of where they are.  Review your medicines with your doctor. Some medicines can make you feel dizzy. This can increase your chance of falling. Ask your doctor what other things that you can do to help prevent falls. This information is not intended to replace advice given to you by your health care provider. Make sure you discuss any  questions you have with your health care provider. Document Released: 04/17/2009 Document Revised: 11/27/2015 Document Reviewed: 07/26/2014 Elsevier Interactive Patient Education  2017 [employer].

## 2019-07-17 NOTE — Progress Notes (Signed)
This visit is being conducted via phone call due to the COVID-19 pandemic. This patient has given me verbal consent via phone to conduct this visit, patient states they are participating from their home address. Some vital signs may be absent or patient reported.   Patient identification: identified by name, DOB, and current address.  Location provider: [facility], Office Persons participating in the virtual visit: Brittany Forts, LPN & Brittany Roberts.   Subjective:   Brittany Roberts is a 79 y.o. female who presents for Medicare Annual (Subsequent) preventive examination.  Brittany Roberts was noted to have audible wheezing and tachypnea at start of telephone visit. Instructed her to use her albuterol inhaler and flovent per instructions as she had not yet used them for the day. Reviewed respiratory medications with her and the importance of using them exactly as instructed for best results. She has vital signs taken every morning and reported to [facility]. Although she lives alone, her daughter lives nearby. She reports 12-14 falls in the last year and ambulates with a rollator. She does have an in home aid Mon-Sat 2 hours on those days. She is not able to get any physical exercise and states she eats only 1-2 meals daily.  Review of Systems:  Cardiac Risk Factors include: obesity (BMI >30kg/m2);sedentary lifestyle;dyslipidemia;diabetes mellitus;advanced age (>20men, >42 women);hypertension     Objective:    Vitals: Ht 5\' 3"  (1.6 m)   Wt 284 lb 8 oz (129 kg)   BMI 50.40 kg/m   Body mass index is 50.4 kg/m.  Advanced Directives 04/11/2018 03/15/2018 01/31/2018 12/13/2017 09/02/2017 09/01/2017 04/08/2017  Does Patient Have a Medical Advance Directive? No Yes Yes No No Yes No  Type of Advance Directive - - Living will - Healthcare Power of [place];Living will -  Does patient want to make changes to medical advance directive? - - No - Patient declined - Yes (Inpatient - patient  requests chaplain consult to change a medical advance directive) Yes (Inpatient - patient requests chaplain consult to change a medical advance directive) -  Copy of [place] in Chart? - - - - - No - copy requested -  Would patient like information on creating a medical advance directive? No - Patient declined - - Yes (Inpatient - patient requests chaplain consult to create a medical advance directive) Yes (Inpatient - patient requests chaplain consult to create a medical advance directive) - -  Pre-existing out of facility DNR order (yellow form or pink MOST form) - - - - - - -    Tobacco Social History   Tobacco Use  Smoking Status Former Smoker  . Packs/day: 3.00  . Years: 40.00  . Pack years: 120.00  . Types: Cigarettes  . Start date: 07/05/1960  . Quit date: 07/05/1998  . Years since quitting: 21.0  Smokeless Tobacco Never Used     Counseling given: Not Answered   Clinical Intake:  Pre-visit preparation completed: Yes  Pain : No/denies pain     BMI - recorded: 50.4 Nutritional Status: BMI > 30  Obese Nutritional Risks: Unintentional weight gain Diabetes: Yes           Past Medical History:  Diagnosis Date  . Acute delirium 01/14/2017  . Anxiety   . Arthritis    "all over"  . Asthma   . Bipolar disorder ([place])   . Complication of anesthesia    "w/right foot OR they gave me too much and they couldn't get  me woke"  . Congestive heart failure, unspecified   . Coronary artery calcification seen on CAT scan 01/14/2017  . Coronary artery disease    "I've got 1 stent" (06/30/2015)  . Depression   . Discoid lupus   . Fibromyalgia    "I've been told I have this; can't take Lyrica cause I'm allergic to it" (06/30/2015)  . GERD (gastroesophageal reflux disease)   . History of gout   . History of hiatal hernia    "real bad" (06/30/2015)  . Hypertension   . Hypothyroidism   . Memory loss   . On home oxygen therapy    "2L q hs" (06/30/2015)  .  OSA (obstructive sleep apnea)    "just use my oxygen; no mask" (06/30/2015)  . Other and unspecified hyperlipidemia   . Penetrating foot wound    left nonhealing foot wound on the dorsal surface  . Peripheral neuropathy   . Pneumonia "many of times"  . Refusal of blood transfusions as patient is Jehovah's Witness   . Renal failure, unspecified    "kidneys not working 100%" (06/30/2015)  . Seizure ([place])   . Sickle cell trait ([place])   . Systemic lupus ([place])   . Thoracic aortic atherosclerosis ([place]) 01/14/2017  . Type II diabetes mellitus ([place])   . Unspecified vitamin D deficiency    Past Surgical History:  Procedure Laterality Date  . ANKLE ARTHROSCOPY WITH OPEN REDUCTION INTERNAL FIXATION (ORIF) Right 03/2011  . CARDIAC CATHETERIZATION N/A 07/01/2015   Procedure: Left Heart Cath and Coronary Angiography;  Surgeon: Adrian Prows, MD;  Location: [facility];  Service: Cardiovascular;  Laterality: N/A;  . CARDIAC CATHETERIZATION N/A 07/01/2015   Procedure: Intravascular Pressure Wire/FFR Study;  Surgeon: Adrian Prows, MD;  Location: [facility];  Service: Cardiovascular;  Laterality: N/A;  . CARPAL TUNNEL RELEASE Bilateral   . CATARACT EXTRACTION W/ INTRAOCULAR LENS  IMPLANT, BILATERAL Bilateral   . CHOLECYSTECTOMY OPEN    . COLONOSCOPY N/A 12/10/2013   Procedure: COLONOSCOPY;  Surgeon: Inda Castle, MD;  Location: [facility];  Service: Endoscopy;  Laterality: N/A;  . FRACTURE SURGERY    . INCISION AND DRAINAGE ABSCESS Left    foot "under my little toe"  . KNEE LIGAMENT RECONSTRUCTION Right   . LEFT HEART CATHETERIZATION WITH CORONARY ANGIOGRAM N/A 08/08/2012   Procedure: LEFT HEART CATHETERIZATION WITH CORONARY ANGIOGRAM;  Surgeon: [name], MD;  Location: [facility] LAB;  Service: Cardiovascular;  Laterality: N/A;  . NASAL SINUS SURGERY    . PERCUTANEOUS CORONARY STENT INTERVENTION (PCI-S) N/A 08/21/2012   Procedure: PERCUTANEOUS CORONARY STENT INTERVENTION (PCI-S);   Surgeon: [name], MD;  Location: [facility] CATH LAB;  Service: Cardiovascular;  Laterality: N/A;  . TUBAL LIGATION     Family History  Problem Relation Age of Onset  . Heart disease Mother   . Diabetes Mother   . Clotting disorder Mother   . Pneumonia Father   . Rheum arthritis Father   . Diabetes Sister   . Diabetes Sister   . Asthma Brother   . Cancer Brother   . Kidney disease Brother   . Lupus Son   . Heart disease Son   . Heart disease Daughter   . Colon cancer Neg Hx    Social History   Socioeconomic History  . Marital status: Single    Spouse name: Not on file  . Number of children: 4  . Years of education: 41 th  . Highest education  level: Not on file  Occupational History  . Occupation: Retired- Engineer, production    Employer: RETIRED  Tobacco Use  . Smoking status: Former Smoker    Packs/day: 3.00    Years: 40.00    Pack years: 120.00    Types: Cigarettes    Start date: 07/05/1960    Quit date: 07/05/1998    Years since quitting: 21.0  . Smokeless tobacco: Never Used  Substance and Sexual Activity  . Alcohol use: No    Alcohol/week: 0.0 standard drinks    Comment: havent drank in over 40 years  . Drug use: No  . Sexual activity: Never  Other Topics Concern  . Not on file  Social History Narrative   Health Care POA:    Emergency Contact: daughter, Lorenso Courier [phone] or [name] [phone]   End of Life Plan: Does not want to be ventilated or feeding tubes.    Who lives with you: Lives alone   Any pets: none   Diet: Patient reports enjoying and eating junk food.  Does not regulate types of food and currently her dentures are broken so it is hard to eat some foods.   Exercise: Patient does not have an exercise plan.   Seatbelts: Patient reports wearing seatbelt when in vehicle.   Sun Exposure/Protection: Patient reports not going outside very often.   Hobbies: Patient enjoys reading the bible and watching game shows.    Right-handed.   1  cup caffeine per day.   Former smoker-stopped 2000   Alcohol none   Social Determinants of Radio broadcast assistant Strain:   . Difficulty of Paying Living Expenses: Not on file  Food Insecurity:   . Worried About Charity fundraiser in the Last Year: Not on file  . Ran Out of Food in the Last Year: Not on file  Transportation Needs:   . Lack of Transportation (Medical): Not on file  . Lack of Transportation (Non-Medical): Not on file  Physical Activity:   . Days of Exercise per Week: Not on file  . Minutes of Exercise per Session: Not on file  Stress:   . Feeling of Stress : Not on file  Social Connections:   . Frequency of Communication with Friends and Family: Not on file  . Frequency of Social Gatherings with Friends and Family: Not on file  . Attends Religious Services: Not on file  . Active Member of Clubs or Organizations: Not on file  . Attends Archivist Meetings: Not on file  . Marital Status: Not on file    Outpatient Encounter Medications as of 07/17/2019  Medication Sig  . Accu-Chek Softclix Lancets lancets Use as instructed  . acetaminophen (TYLENOL) 325 MG tablet Take 650 mg by mouth every 6 (six) hours as needed for mild pain or moderate pain.  Marland Kitchen allopurinol (ZYLOPRIM) 100 MG tablet Take 1 tablet (100 mg total) by mouth daily.  Marland Kitchen amLODipine (NORVASC) 10 MG tablet Take 10 mg by mouth daily.  . Ascorbic Acid (VITAMIN C PO) Take 1 tablet by mouth daily.  Marland Kitchen aspirin EC 81 MG tablet Take 81 mg by mouth daily.   Marland Kitchen atorvastatin (LIPITOR) 20 MG tablet Take 1 tablet (20 mg total) by mouth daily.  . B Complex Vitamins (VITAMIN B COMPLEX) TABS Take 1 tablet by mouth daily.  . Cholecalciferol (VITAMIN D3) 5000 UNITS CAPS Take 5,000 Units by mouth daily.  . diclofenac sodium (VOLTAREN) 1 % GEL Apply 4 g topically  4 (four) times daily.  . DULoxetine (CYMBALTA) 30 MG capsule Take 1 capsule (30 mg total) by mouth daily.  . ferrous sulfate 325 (65 FE) MG tablet Take  325 mg by mouth daily with breakfast.  . furosemide (LASIX) 40 MG tablet Take 40 mg by mouth daily.   Marland Kitchen gabapentin (NEURONTIN) 300 MG capsule TAKE 1 CAPSULE BY MOUTH TWICE DAILY  . glucose blood (ACCU-CHEK AVIVA PLUS) test strip Use to test blood sugar three times daily  . HUMULIN N KWIKPEN 100 UNIT/ML Kiwkpen ADMINISTER 30 UNITS UNDER THE SKIN EVERY MORNING  . Insulin Pen Needle (B-D ULTRAFINE III SHORT PEN) 31G X 8 MM MISC CHECK BLOOD SUGAR 3 TIMES A DAY  . levETIRAcetam (KEPPRA) 500 MG tablet Take 1 tablet (500 mg total) by mouth 2 (two) times daily.  . memantine (NAMENDA) 10 MG tablet Take 1 tablet (10 mg total) by mouth 2 (two) times daily.  . metoprolol succinate (TOPROL-XL) 50 MG 24 hr tablet Take 1 tablet (50 mg total) by mouth daily. Take with or immediately following a meal.  . montelukast (SINGULAIR) 10 MG tablet Take 1 tablet (10 mg total) by mouth at bedtime.  . Multiple Vitamin (MULTIVITAMIN WITH MINERALS) TABS tablet Take 1 tablet by mouth daily.  . pantoprazole (PROTONIX) 40 MG tablet TAKE 1 TABLET(40 MG) BY MOUTH DAILY  . potassium chloride (KLOR-CON) 10 MEQ tablet TAKE 1 TABLET BY MOUTH 2 TIMES A DAY WITH LASIX  . [DISCONTINUED] fluticasone (FLOVENT HFA) 110 MCG/ACT inhaler Inhale 2 puffs into the lungs 2 (two) times daily.  . [DISCONTINUED] PROAIR HFA 108 (90 Base) MCG/ACT inhaler INHALE 2 PUFF BY MOUTH EVERY 4 HOURS AS NEEDED USE ONLY IF YOU ARE WHEEZING  . isosorbide mononitrate (IMDUR) 30 MG 24 hr tablet Take 1 tablet (30 mg total) by mouth daily.  . nitroGLYCERIN (NITROSTAT) 0.4 MG SL tablet Place 1 tablet (0.4 mg total) under the tongue every 5 (five) minutes as needed for chest pain.   No facility-administered encounter medications on file as of 07/17/2019.    Activities of Daily Living In your present state of health, do you have any difficulty performing the following activities: 07/17/2019 03/14/2019  Hearing? Y Y  Comment also wears hearing aids bilaterally -    Vision? N N  Difficulty concentrating or making decisions? N Y  Walking or climbing stairs? Y Y  Dressing or bathing? N Y  Doing errands, shopping? Tempie Donning  Preparing Food and eating ? Y -  Using the Toilet? N -  In the past six months, have you accidently leaked urine? Y -  Do you have problems with loss of bowel control? Y -  Managing your Medications? Y -  Comment daughter helps -  Managing your Finances? Y -  Comment daughter helps -  Housekeeping or managing your Housekeeping? Y -  Some recent data might be hidden    Patient Care Team: [name], [name], MD as PCP - General (Family Medicine) Inocencio Homes, DPM as Consulting Physician (Podiatry) Adrian Prows, MD as Consulting Physician (Cardiology) Inda Castle, MD (Inactive) as Consulting Physician (Gastroenterology)    Assessment:   This is a routine wellness examination for A[place].  Exercise Activities and Dietary recommendations Current Exercise Habits: The patient does not participate in regular exercise at present, Exercise limited by: cardiac condition(s)  Goals    . Do chair exercises during the Price is Right every day    . Eat 4-5 vegetables a day    .  Exercise 150 min/wk Moderate Activity     Keep getting up and down and walking around the home    . Patient will be compliant with respiratory inhalers       Fall Risk Fall Risk  07/17/2019 03/14/2019 01/01/2019 03/15/2018 01/31/2018  Falls in the past year? 1 0 1 No Yes  Number falls in past yr: 1 0 1 - 2 or more  Injury with Fall? - 0 1 - No  Risk Factor Category  - - - - High Fall Risk  Risk for fall due to : History of fall(s);Impaired balance/gait;Impaired mobility;Medication side effect;Other (Comment) Impaired balance/gait;Orthopedic patient;Impaired mobility Impaired balance/gait - History of fall(s);Impaired balance/gait;Medication side effect;Impaired mobility;Impaired vision  Risk for fall due to: Comment cardiac condition - - - -  Follow up Falls evaluation  completed;Education provided;Falls prevention discussed Education provided Falls evaluation completed - Falls evaluation completed;Falls prevention discussed;Education provided   Is the patient's home free of loose throw rugs in walkways, pet beds, electrical cords, etc?   yes      Grab bars in the bathroom? yes      Handrails on the stairs?   yes      Adequate lighting?   yes  Timed Get Up and Go performed: N/A due to telephone visit  Depression Screen PHQ 2/9 Scores 07/17/2019 03/14/2019 03/15/2018 01/31/2018  PHQ - 2 Score 6 0 0 0  PHQ- 9 Score 10 - - -     Cognitive Function MMSE - Mini Mental State Exam 04/05/2019 10/27/2017 10/15/2010  Orientation to time 5 5 5   Orientation to Place 5 5 5   Registration 3 3 1   Attention/ Calculation 5 5 4   Recall 3 3 1   Language- name 2 objects 2 2 2   Language- repeat 1 1 1   Language- follow 3 step command 3 3 3   Language- read & follow direction 1 1 1   Write a sentence 1 1 1   Copy design 1 1 1   Total score 30 30 25      6CIT Screen 07/17/2019 03/15/2018 03/15/2018  What Year? 0 points 0 points 0 points  What month? 0 points 0 points 0 points  What time? 0 points - 0 points  Count back from 20 0 points - 0 points  Months in reverse 2 points - 0 points  Repeat phrase 2 points - 0 points  Total Score 4 - 0    Immunization History  Administered Date(s) Administered  . Fluad Quad(high Dose 65+) 03/14/2019  . Influenza Split 04/03/2012, 02/24/2016  . Influenza Whole 05/07/2009, 05/08/2010, 03/15/2011  . Influenza, High Dose Seasonal PF 04/08/2017, 03/15/2018  . Influenza,inj,Quad PF,6+ Mos 04/25/2013, 04/05/2014, 06/05/2015  . Pneumococcal Conjugate-13 08/10/2013  . Pneumococcal Polysaccharide-23 08/05/2000, 02/26/2009, 02/24/2016  . Td 08/05/2005    Qualifies for Shingles Vaccine?Yes and patient stated she will not be able to obtain due to out of pocket cost for her.   Screening Tests Health Maintenance  Topic Date Due  . TETANUS/TDAP   08/06/2015  . FOOT EXAM  03/16/2019  . OPHTHALMOLOGY EXAM  06/22/2019  . HEMOGLOBIN A1C  09/11/2019  . INFLUENZA VACCINE  Completed  . DEXA SCAN  Completed  . PNA vac Low Risk Adult  Completed    Cancer Screenings: Lung: Low Dose CT Chest recommended if Age 89-80 years, 30 pack-year currently smoking OR have quit w/in 15years. Patient does not qualify. Breast:  Up to date on Mammogram? No   Up to date of Bone Density/Dexa? Yes  Colorectal: yes  Additional Screenings:  Hepatitis C Screening: N/A due to age.      Plan:   Telephone visit was made with PCP for later today for the audible wheezing and marked SOB that patient reported x 2-3 weeks. She feels like the Flovent is not helping her. She does not have a nebulizer machine at home. She is due for an updated Tdap and Shingrix but will not be able to obtain due to financial restraint. In house referral to community care manager for resources for patient to see eye provider and dentist. She is currently experiencing oral pain with dentures and they do not fit her properly. She states this makes it very hard for her to eat. Patient could also benefit from pulse oximeter as she has hx COPD and CHF.    I have personally reviewed and noted the following in the patient's chart:   . Medical and social history . Use of alcohol, tobacco or illicit drugs  . Current medications and supplements . Functional ability and status . Nutritional status . Physical activity . Advanced directives . List of other physicians . Hospitalizations, surgeries, and ER visits in previous 12 months . Vitals . Screenings to include cognitive, depression, and falls . Referrals and appointments  In addition, I have reviewed and discussed with patient certain preventive protocols, quality metrics, and best practice recommendations. A written personalized care plan for preventive services as well as general preventive health recommendations were provided to  patient.     Brittany Forts, LPN  1/61/0960

## 2019-07-18 ENCOUNTER — Ambulatory Visit: Payer: Medicare Other | Admitting: Neurology

## 2019-07-18 DIAGNOSIS — L658 Other specified nonscarring hair loss: Secondary | ICD-10-CM | POA: Diagnosis not present

## 2019-07-18 DIAGNOSIS — L853 Xerosis cutis: Secondary | ICD-10-CM | POA: Diagnosis not present

## 2019-07-19 ENCOUNTER — Ambulatory Visit: Payer: Medicare Other | Admitting: Neurology

## 2019-07-19 ENCOUNTER — Telehealth: Payer: Self-pay

## 2019-07-19 NOTE — Telephone Encounter (Signed)
07/19/2019 Spoke with patient about Medicaid coverage for eye exam, dental exam and pulse oximeter.  Patient stated that she would call Dr. Gershon Crane for eye exam and [place] to make her dental appt. they have a concierge program that will tell her approved providers and make the appointment. Sent message via Epic to Molli Knock, LPN regarding phycian call to [facility] for prior authorization for the pulse oximeter. Ambrose Mantle [phone]

## 2019-07-24 ENCOUNTER — Other Ambulatory Visit: Payer: Self-pay

## 2019-07-24 MED ORDER — GABAPENTIN 300 MG PO CAPS: 300.0000 mg | ORAL_CAPSULE | Freq: Two times a day (BID) | ORAL | 3 refills | Status: DC

## 2019-07-30 ENCOUNTER — Other Ambulatory Visit: Payer: Self-pay

## 2019-07-30 ENCOUNTER — Ambulatory Visit (INDEPENDENT_AMBULATORY_CARE_PROVIDER_SITE_OTHER): Payer: Medicare Other | Admitting: Neurology

## 2019-07-30 ENCOUNTER — Encounter: Payer: Self-pay | Admitting: Neurology

## 2019-07-30 VITALS — BP 140/69 | HR 63 | Temp 97.0°F | Ht 63.0 in | Wt 289.0 lb

## 2019-07-30 DIAGNOSIS — G40209 Localization-related (focal) (partial) symptomatic epilepsy and epileptic syndromes with complex partial seizures, not intractable, without status epilepticus: Secondary | ICD-10-CM

## 2019-07-30 DIAGNOSIS — R569 Unspecified convulsions: Secondary | ICD-10-CM

## 2019-07-30 DIAGNOSIS — R413 Other amnesia: Secondary | ICD-10-CM | POA: Diagnosis not present

## 2019-07-30 MED ORDER — LEVETIRACETAM 500 MG PO TABS: 500.0000 mg | ORAL_TABLET | Freq: Two times a day (BID) | ORAL | 3 refills | Status: DC

## 2019-07-30 NOTE — Patient Instructions (Signed)
Continue current medications, no changes, call if your have a seizure, return in 6 months

## 2019-07-30 NOTE — Progress Notes (Signed)
PATIENT: Brittany Roberts DOB: 30-Dec-1940  REASON FOR VISIT: follow up HISTORY FROM: patient  HISTORY OF PRESENT ILLNESS: Today 07/30/19  HISTORY  Brittany Roberts is a 79 year old female, seen in request by her primary care physician Dr. [name], Betty for evaluation of seizure, memory loss, initial evaluation was April 05, 2019.  I have reviewed and summarized the referring note from the referring physician.  She has past medical history of hyperlipidemia, diabetes, hypertension, obesity, previous history of stroke, she also had a history of congestive heart failure, schizophrenia. She reported seizure in July 2018, but was not able to give detailed description of the event, I was able to review previous neurologist Dr. Amparo Bristol evaluation on April 26, 2019, there was a witnessed seizure activity at emergency room, had deviated to the left side, followed by confusion, agitation, and somnolence.  She was treated with Keppra 500 mg twice daily, there was no recurrent seizure since.  MRI of the brain at that time showed right frontal encephalomalacia.  Had no recurrent seizure since then.  She graduated from [employer] school, retired from Psychologist, educational job, lives at home, noticed gradual onset memory loss since 2019, tends to repeat herself, has moment of word finding difficulties, but there was no staring spells, no seizure activity,  She also complains of loud snoring, frequent awakening at nighttime, daytime sleepiness, she has history of COPD, was a longtime smoker, used nighttime oxygen since 2014.  EEG in July 2018 showed background slowing throughout the tracing, there was no epileptiform discharge.  I personally reviewed MRI of the brain July 2018, encephalomalacia involving the right frontal lobe, mild diffuse atrophy, supratentorium small vessel disease.  Laboratory evaluations in 2020, A1c 6.6, CMP showed mild elevated creatinine 1.87, GFR of 31, CBC showed hemoglobin of  12.2,  Update July 30, 2019 SS: TSH, B12, RPR, HIV, folate were normal.  EEG was normal.  Sleep evaluation, and sleep study did not show any significant OSA with the exception of mild REM related OSA and intermittent snoring.  CPAP therapy not indicated, recommend weight loss and avoiding the supine sleep position.  She continues to do well.  She has not had recurrent seizure, feels that her memory is doing well.  She lives alone, her daughter organizes her pills in a weekly pillbox, she takes her medications daily.  She uses a walker, reports has been wheezing more, and is now taking a prednisone pack, her blood sugars have been running high.  She is tolerating Keppra without side effect.   REVIEW OF SYSTEMS: Out of a complete 14 system review of symptoms, the patient complains only of the following symptoms, and all other reviewed systems are negative.  Seizures, memory loss   ALLERGIES: Allergies  Allergen Reactions  . Ace Inhibitors Other (See Comments)    Coincided with sig bump in creat. Retried and creat bumped again. Coincided with sig bump in creat. Retried and creat bumped again. Coincided with sig bump in creat. Retried and creat bumped again. Other reaction(s): Other (See Comments) Coincided with sig bump in creat. Retried and creat bumped again. Coincided with sig bump in creat. Retried and creat bumped again.  . Isosorb Dinitrate-Hydralazine Other (See Comments)    Sleep all the time. Sleep all the time. Other reaction(s): Other (See Comments) Sleep all the time.  Marland Kitchen Penicillins Anaphylaxis, Swelling and Rash    Has patient had a PCN reaction causing immediate rash, facial/tongue/throat swelling, SOB or lightheadedness with hypotension: Yes Has patient had  a PCN reaction causing severe rash involving mucus membranes or skin necrosis: Yes Has patient had a PCN reaction that required hospitalization: Yes Has patient had a PCN reaction occurring within the last 10 years:  No If all of the above answers are "NO", then may proceed with Cephalosporin use.   Marland Kitchen Buspirone Other (See Comments)    pain pain pain Other reaction(s): Other (See Comments) pain pain  . Pregabalin Swelling  . Ropinirole Hydrochloride Swelling  . Amantadines Rash    "Swelling of the tongue"    HOME MEDICATIONS: Outpatient Medications Prior to Visit  Medication Sig Dispense Refill  . Accu-Chek Softclix Lancets lancets Use as instructed 100 each 3  . acetaminophen (TYLENOL) 325 MG tablet Take 650 mg by mouth every 6 (six) hours as needed for mild pain or moderate pain.    Marland Kitchen allopurinol (ZYLOPRIM) 100 MG tablet Take 1 tablet (100 mg total) by mouth daily. 90 tablet 3  . amLODipine (NORVASC) 10 MG tablet Take 10 mg by mouth daily.    . Ascorbic Acid (VITAMIN C PO) Take 1 tablet by mouth daily.    Marland Kitchen aspirin EC 81 MG tablet Take 81 mg by mouth daily.     Marland Kitchen atorvastatin (LIPITOR) 20 MG tablet Take 1 tablet (20 mg total) by mouth daily. 90 tablet 2  . B Complex Vitamins (VITAMIN B COMPLEX) TABS Take 1 tablet by mouth daily.    . budesonide-formoterol (SYMBICORT) 80-4.5 MCG/ACT inhaler Inhale 2 puffs into the lungs 2 (two) times daily. 1 Inhaler 3  . Cholecalciferol (VITAMIN D3) 5000 UNITS CAPS Take 5,000 Units by mouth daily.    . diclofenac sodium (VOLTAREN) 1 % GEL Apply 4 g topically 4 (four) times daily. 150 g 2  . DULoxetine (CYMBALTA) 30 MG capsule Take 1 capsule (30 mg total) by mouth daily. 30 capsule 1  . ferrous sulfate 325 (65 FE) MG tablet Take 325 mg by mouth daily with breakfast.    . furosemide (LASIX) 40 MG tablet Take 40 mg by mouth daily.     Marland Kitchen gabapentin (NEURONTIN) 300 MG capsule Take 1 capsule (300 mg total) by mouth 2 (two) times daily. 60 capsule 3  . glucose blood (ACCU-CHEK AVIVA PLUS) test strip Use to test blood sugar three times daily 100 each 3  . HUMULIN N KWIKPEN 100 UNIT/ML Kiwkpen ADMINISTER 30 UNITS UNDER THE SKIN EVERY MORNING 30 mL 3  . Insulin Pen  Needle (B-D ULTRAFINE III SHORT PEN) 31G X 8 MM MISC CHECK BLOOD SUGAR 3 TIMES A DAY 100 each 3  . memantine (NAMENDA) 10 MG tablet Take 1 tablet (10 mg total) by mouth 2 (two) times daily. 60 tablet 11  . metoprolol succinate (TOPROL-XL) 50 MG 24 hr tablet Take 1 tablet (50 mg total) by mouth daily. Take with or immediately following a meal. 90 tablet 1  . montelukast (SINGULAIR) 10 MG tablet Take 1 tablet (10 mg total) by mouth at bedtime. 90 tablet 3  . Multiple Vitamin (MULTIVITAMIN WITH MINERALS) TABS tablet Take 1 tablet by mouth daily.    . pantoprazole (PROTONIX) 40 MG tablet TAKE 1 TABLET(40 MG) BY MOUTH DAILY 90 tablet 3  . potassium chloride (KLOR-CON) 10 MEQ tablet TAKE 1 TABLET BY MOUTH 2 TIMES A DAY WITH LASIX 60 tablet 3  . PROAIR HFA 108 (90 Base) MCG/ACT inhaler INHALE 2 PUFF BY MOUTH EVERY 4 HOURS AS NEEDED USE ONLY IF YOU ARE WHEEZING 8.5 g 2  . Spacer/Aero-Holding Chambers (  AEROCHAMBER PLUS) inhaler Use as instructed to use with inahaler. 1 each 1  . levETIRAcetam (KEPPRA) 500 MG tablet Take 1 tablet (500 mg total) by mouth 2 (two) times daily. 180 tablet 3  . isosorbide mononitrate (IMDUR) 30 MG 24 hr tablet Take 1 tablet (30 mg total) by mouth daily. 90 tablet 3  . nitroGLYCERIN (NITROSTAT) 0.4 MG SL tablet Place 1 tablet (0.4 mg total) under the tongue every 5 (five) minutes as needed for chest pain. 90 tablet 3   No facility-administered medications prior to visit.    PAST MEDICAL HISTORY: Past Medical History:  Diagnosis Date  . Acute delirium 01/14/2017  . Anxiety   . Arthritis    "all over"  . Asthma   . Bipolar disorder ([place])   . Complication of anesthesia    "w/right foot OR they gave me too much and they couldn't get me woke"  . Congestive heart failure, unspecified   . Coronary artery calcification seen on CAT scan 01/14/2017  . Coronary artery disease    "I've got 1 stent" (06/30/2015)  . Depression   . Discoid lupus   . Fibromyalgia    "I've been told  I have this; can't take Lyrica cause I'm allergic to it" (06/30/2015)  . GERD (gastroesophageal reflux disease)   . History of gout   . History of hiatal hernia    "real bad" (06/30/2015)  . Hypertension   . Hypothyroidism   . Memory loss   . On home oxygen therapy    "2L q hs" (06/30/2015)  . OSA (obstructive sleep apnea)    "just use my oxygen; no mask" (06/30/2015)  . Other and unspecified hyperlipidemia   . Penetrating foot wound    left nonhealing foot wound on the dorsal surface  . Peripheral neuropathy   . Pneumonia "many of times"  . Refusal of blood transfusions as patient is Jehovah's Witness   . Renal failure, unspecified    "kidneys not working 100%" (06/30/2015)  . Seizure ([place])   . Sickle cell trait ([place])   . Systemic lupus ([place])   . Thoracic aortic atherosclerosis ([place]) 01/14/2017  . Type II diabetes mellitus ([place])   . Unspecified vitamin D deficiency     PAST SURGICAL HISTORY: Past Surgical History:  Procedure Laterality Date  . ANKLE ARTHROSCOPY WITH OPEN REDUCTION INTERNAL FIXATION (ORIF) Right 03/2011  . CARDIAC CATHETERIZATION N/A 07/01/2015   Procedure: Left Heart Cath and Coronary Angiography;  Surgeon: Adrian Prows, MD;  Location: [facility];  Service: Cardiovascular;  Laterality: N/A;  . CARDIAC CATHETERIZATION N/A 07/01/2015   Procedure: Intravascular Pressure Wire/FFR Study;  Surgeon: Adrian Prows, MD;  Location: [facility];  Service: Cardiovascular;  Laterality: N/A;  . CARPAL TUNNEL RELEASE Bilateral   . CATARACT EXTRACTION W/ INTRAOCULAR LENS  IMPLANT, BILATERAL Bilateral   . CHOLECYSTECTOMY OPEN    . COLONOSCOPY N/A 12/10/2013   Procedure: COLONOSCOPY;  Surgeon: Inda Castle, MD;  Location: [facility];  Service: Endoscopy;  Laterality: N/A;  . FRACTURE SURGERY    . INCISION AND DRAINAGE ABSCESS Left    foot "under my little toe"  . KNEE LIGAMENT RECONSTRUCTION Right   . LEFT HEART CATHETERIZATION WITH CORONARY ANGIOGRAM N/A  08/08/2012   Procedure: LEFT HEART CATHETERIZATION WITH CORONARY ANGIOGRAM;  Surgeon: [name], MD;  Location: [facility] C[facility];  Service: Cardiovascular;  Laterality: N/A;  . NASAL SINUS SURGERY    . PERCUTANEOUS CORONARY STENT INTERVENTION (PCI-S) N/A 08/21/2012  Procedure: PERCUTANEOUS CORONARY STENT INTERVENTION (PCI-S);  Surgeon: [name], MD;  Location: [facility] CATH LAB;  Service: Cardiovascular;  Laterality: N/A;  . TUBAL LIGATION      FAMILY HISTORY: Family History  Problem Relation Age of Onset  . Heart disease Mother   . Diabetes Mother   . Clotting disorder Mother   . Pneumonia Father   . Rheum arthritis Father   . Diabetes Sister   . Diabetes Sister   . Asthma Brother   . Cancer Brother   . Kidney disease Brother   . Lupus Son   . Heart disease Son   . Heart disease Daughter   . Colon cancer Neg Hx     SOCIAL HISTORY: Social History   Socioeconomic History  . Marital status: Single    Spouse name: Not on file  . Number of children: 4  . Years of education: 73 th  . Highest education level: Not on file  Occupational History  . Occupation: Retired- Engineer, production    Employer: RETIRED  Tobacco Use  . Smoking status: Former Smoker    Packs/day: 3.00    Years: 40.00    Pack years: 120.00    Types: Cigarettes    Start date: 07/05/1960    Quit date: 07/05/1998    Years since quitting: 21.0  . Smokeless tobacco: Never Used  Substance and Sexual Activity  . Alcohol use: No    Alcohol/week: 0.0 standard drinks    Comment: havent drank in over 40 years  . Drug use: No  . Sexual activity: Never  Other Topics Concern  . Not on file  Social History Narrative   Health Care POA:    Emergency Contact: daughter, Lorenso Courier [phone] or [name] [phone]   End of Life Plan: Does not want to be ventilated or feeding tubes.    Who lives with you: Lives alone   Any pets: none   Diet: Patient reports enjoying and eating junk food.  Does not  regulate types of food and currently her dentures are broken so it is hard to eat some foods.   Exercise: Patient does not have an exercise plan.   Seatbelts: Patient reports wearing seatbelt when in vehicle.   Sun Exposure/Protection: Patient reports not going outside very often.   Hobbies: Patient enjoys reading the bible and watching game shows.    Right-handed.   1 cup caffeine per day.   Former smoker-stopped 2000   Alcohol none   Social Determinants of Radio broadcast assistant Strain:   . Difficulty of Paying Living Expenses: Not on file  Food Insecurity:   . Worried About Charity fundraiser in the Last Year: Not on file  . Ran Out of Food in the Last Year: Not on file  Transportation Needs:   . Lack of Transportation (Medical): Not on file  . Lack of Transportation (Non-Medical): Not on file  Physical Activity:   . Days of Exercise per Week: Not on file  . Minutes of Exercise per Session: Not on file  Stress:   . Feeling of Stress : Not on file  Social Connections:   . Frequency of Communication with Friends and Family: Not on file  . Frequency of Social Gatherings with Friends and Family: Not on file  . Attends Religious Services: Not on file  . Active Member of Clubs or Organizations: Not on file  . Attends Archivist Meetings: Not on file  . Marital Status:  Not on file  Intimate Partner Violence:   . Fear of Current or Ex-Partner: Not on file  . Emotionally Abused: Not on file  . Physically Abused: Not on file  . Sexually Abused: Not on file   PHYSICAL EXAM  Vitals:   07/30/19 1333  BP: 140/69  Pulse: 63  Temp: (!) 97 F (36.1 C)  TempSrc: Oral  Weight: 289 lb (131.1 kg)  Height: 5\' 3"  (1.6 m)   Body mass index is 51.19 kg/m.  Generalized: Well developed, in no acute distress   Neurological examination  Mentation: Alert oriented to time, place, history taking. Follows all commands speech and language fluent Cranial nerve II-XII: Pupils  were equal round reactive to light. Extraocular movements were full, visual field were full on confrontational test. Facial sensation and strength were normal.  Head turning and shoulder shrug were normal and symmetric. Motor: The motor testing reveals 5 over 5 strength of all 4 extremities. Good symmetric motor tone is noted throughout.  Sensory: Sensory testing is intact to soft touch on all 4 extremities. No evidence of extinction is noted.  Coordination: Cerebellar testing reveals good finger-nose-finger and heel-to-shin bilaterally.  Gait and station: Slow to rise from seated position, gait is wide-based, cautious, able to ambulate without walker in room, otherwise uses Rollator Reflexes: Deep tendon reflexes are symmetric  DIAGNOSTIC DATA (LABS, IMAGING, TESTING) - I reviewed patient records, labs, notes, testing and imaging myself where available.  Lab Results  Component Value Date   WBC 8.9 07/21/2018   HGB 12.2 07/21/2018   HCT 37.1 07/21/2018   MCV 74.6 (L) 07/21/2018   PLT 229.0 07/21/2018      Component Value Date/Time   NA 143 03/14/2019 1159   NA 139 01/24/2017 0000   K 5.2 (H) 03/14/2019 1159   CL 109 03/14/2019 1159   CO2 29 03/14/2019 1159   GLUCOSE 84 03/14/2019 1159   BUN 40 (H) 03/14/2019 1159   BUN 35 (A) 01/24/2017 0000   CREATININE 1.87 (H) 03/14/2019 1159   CREATININE 1.43 (H) 09/08/2016 1121   CALCIUM 9.4 03/14/2019 1159   PROT 7.9 03/14/2019 1159   PROT 7.4 10/29/2014 1100   ALBUMIN 3.5 03/14/2019 1159   ALBUMIN 3.7 10/29/2014 1100   AST 21 03/14/2019 1159   ALT 14 03/14/2019 1159   ALKPHOS 129 (H) 03/14/2019 1159   BILITOT 0.5 03/14/2019 1159   BILITOT 0.3 10/29/2014 1100   GFRNONAA 33 (L) 12/15/2017 0708   GFRNONAA 36 (L) 09/08/2016 1121   GFRAA 38 (L) 12/15/2017 0708   GFRAA 41 (L) 09/08/2016 1121   Lab Results  Component Value Date   CHOL 103 07/19/2014   HDL 38 (L) 07/19/2014   LDLCALC 49 07/19/2014   LDLDIRECT 59 02/16/2013   TRIG 81  07/19/2014   CHOLHDL 2.7 07/19/2014   Lab Results  Component Value Date   HGBA1C 6.5 03/14/2019   Lab Results  Component Value Date   VITAMINB12 >2000 (H) 04/05/2019   Lab Results  Component Value Date   TSH 1.210 04/05/2019    ASSESSMENT AND PLAN 79 y.o. year old female  has a past medical history of Acute delirium (01/14/2017), Anxiety, Arthritis, Asthma, Bipolar disorder ([place]), Complication of anesthesia, Congestive heart failure, unspecified, Coronary artery calcification seen on CAT scan (01/14/2017), Coronary artery disease, Depression, Discoid lupus, Fibromyalgia, GERD (gastroesophageal reflux disease), History of gout, History of hiatal hernia, Hypertension, Hypothyroidism, Memory loss, On home oxygen therapy, OSA (obstructive sleep apnea), Other and unspecified hyperlipidemia,  Penetrating foot wound, Peripheral neuropathy, Pneumonia ("many of times"), Refusal of blood transfusions as patient is Jehovah's Witness, Renal failure, unspecified, Seizure ([place]), Sickle cell trait ([place]), Systemic lupus ([place]), Thoracic aortic atherosclerosis ([place]) (01/14/2017), Type II diabetes mellitus ([place]), and Unspecified vitamin D deficiency. here with:  1. Right frontal stroke 2. Seizure 3. Memory loss 4. Obstructive sleep apnea -EEG was normal -Laboratory evaluation was unremarkable (TSH, B12, RPR, HIV, folate) -No recent seizure, tolerating Keppra well, continue Keppra 500 mg twice daily, refill sent  -memory score was 27/30 today, continue Namenda 10 mg twice daily -Sleep study did not show any significant obstructive sleep apnea, with the exception of mild REM associated OSA and intermittent snoring, CPAP therapy is not indicated, supplemental oxygen was not required, recommend weight loss and avoiding supine sleep position -Call for recurrent seizure, otherwise follow-up in 6 months or sooner if needed   I spent 15 minutes with the patient. 50% of this time was spent discussing her plan of  care.    Butler Denmark, AGNP-C, DNP 07/30/2019, 1:59 PM [facility] [address], [place],  [place] [address] [phone]

## 2019-08-06 ENCOUNTER — Telehealth: Payer: Self-pay

## 2019-08-06 NOTE — Telephone Encounter (Signed)
08/06/2019 Follow-up call. Spoke with patient about Medicaid coverage for eye exam, dental exam and pulse oximeter.  Patient stated that she would call Dr. Gershon Crane for eye exam.  . Patient was having difficulty remembering and following conversation.  Will give information to patient's daughter [name] per DPR. Ambrose Mantle [phone]

## 2019-08-06 NOTE — Telephone Encounter (Signed)
08/06/2019 Spoke with patient's daughter regarding Medicaid coverage of dental and eye exams and also pulse oximeter.  She was aware and will follow-up.  Patient does not have any other needs.  Ambrose Mantle [phone]

## 2019-08-14 DIAGNOSIS — H04123 Dry eye syndrome of bilateral lacrimal glands: Secondary | ICD-10-CM | POA: Diagnosis not present

## 2019-08-14 DIAGNOSIS — Z961 Presence of intraocular lens: Secondary | ICD-10-CM | POA: Diagnosis not present

## 2019-08-14 DIAGNOSIS — Z794 Long term (current) use of insulin: Secondary | ICD-10-CM | POA: Diagnosis not present

## 2019-08-14 DIAGNOSIS — E119 Type 2 diabetes mellitus without complications: Secondary | ICD-10-CM | POA: Diagnosis not present

## 2019-08-15 DIAGNOSIS — M25869 Other specified joint disorders, unspecified knee: Secondary | ICD-10-CM | POA: Diagnosis not present

## 2019-08-15 DIAGNOSIS — J45909 Unspecified asthma, uncomplicated: Secondary | ICD-10-CM | POA: Diagnosis not present

## 2019-08-15 DIAGNOSIS — I10 Essential (primary) hypertension: Secondary | ICD-10-CM | POA: Diagnosis not present

## 2019-08-17 ENCOUNTER — Telehealth: Payer: Self-pay | Admitting: Family Medicine

## 2019-08-17 NOTE — Telephone Encounter (Signed)
Pt would like some advice if she should get the COVID vaccine? Thanks

## 2019-08-17 NOTE — Telephone Encounter (Signed)
I do not see an absolute contraindications for receiving COVID 19 vaccine. Because her age and chronic medical problems benefit outweighs he risk; so I recommend vaccination. Thanks, BJ

## 2019-08-20 NOTE — Telephone Encounter (Signed)
I left pt a voicemail letting her know that the vaccine is recommend for her & to call back with any questions.

## 2019-08-28 ENCOUNTER — Other Ambulatory Visit: Payer: Self-pay | Admitting: Family Medicine

## 2019-09-05 NOTE — Progress Notes (Signed)
I have reviewed and agreed above plan. 

## 2019-09-08 ENCOUNTER — Other Ambulatory Visit: Payer: Self-pay | Admitting: Family Medicine

## 2019-09-08 DIAGNOSIS — M159 Polyosteoarthritis, unspecified: Secondary | ICD-10-CM

## 2019-09-11 ENCOUNTER — Ambulatory Visit: Payer: Medicare Other | Admitting: Family Medicine

## 2019-09-26 ENCOUNTER — Telehealth: Payer: Self-pay | Admitting: Family Medicine

## 2019-09-26 DIAGNOSIS — Z1231 Encounter for screening mammogram for malignant neoplasm of breast: Secondary | ICD-10-CM

## 2019-09-26 NOTE — Telephone Encounter (Signed)
The patient is wanting to have orders put in to have a mammogram done.

## 2019-09-26 NOTE — Telephone Encounter (Signed)
Okay for order?

## 2019-09-28 NOTE — Telephone Encounter (Signed)
Order placed, breast center will call patient to schedule.

## 2019-09-28 NOTE — Telephone Encounter (Signed)
Usually referral is not needed but it is ok to place order. Thanks, BJ

## 2019-09-28 NOTE — Addendum Note (Signed)
Addended by: Rodrigo Ran on: 09/28/2019 08:37 AM   Modules accepted: Orders

## 2019-10-04 ENCOUNTER — Ambulatory Visit: Payer: Medicare Other | Attending: Internal Medicine

## 2019-10-04 ENCOUNTER — Telehealth: Payer: Self-pay | Admitting: Family Medicine

## 2019-10-04 DIAGNOSIS — Z23 Encounter for immunization: Secondary | ICD-10-CM

## 2019-10-04 DIAGNOSIS — M25869 Other specified joint disorders, unspecified knee: Secondary | ICD-10-CM | POA: Diagnosis not present

## 2019-10-04 DIAGNOSIS — I10 Essential (primary) hypertension: Secondary | ICD-10-CM | POA: Diagnosis not present

## 2019-10-04 DIAGNOSIS — J45909 Unspecified asthma, uncomplicated: Secondary | ICD-10-CM | POA: Diagnosis not present

## 2019-10-04 NOTE — Progress Notes (Signed)
  Chronic Care Management   Outreach Note  10/04/2019 Name: Brittany Roberts MRN: [id number] DOB: April 17, 1941  Referred by: [name], [name], MD Reason for referral : No chief complaint on file.   An unsuccessful telephone outreach was attempted today. The patient was referred to the pharmacist for assistance with care management and care coordination.   Follow Up Plan:   Raynicia Dukes [employer] Scheduler

## 2019-10-04 NOTE — Progress Notes (Signed)
   Covid-19 Vaccination Clinic  Name:  Brittany Roberts    MRN: [id number] DOB: 10/21/1940  10/04/2019  Brittany Roberts was observed post Covid-19 immunization for 30 minutes based on pre-vaccination screening without incident. She was provided with Vaccine Information Sheet and instruction to access the V-Safe system.   Brittany Roberts was instructed to call 911 with any severe reactions post vaccine: Marland Kitchen Difficulty breathing  . Swelling of face and throat  . A fast heartbeat  . A bad rash all over body  . Dizziness and weakness   Immunizations Administered    Name Date Dose VIS Date Route   Pfizer COVID-19 Vaccine 10/04/2019  1:03 PM 0.3 mL 06/15/2019 Intramuscular   Manufacturer: [place]   Lot: [id number]   [place]: [id number]

## 2019-10-14 NOTE — Progress Notes (Signed)
Subjective:   Brittany Roberts, female    DOB: October 14, 1940, 79 y.o.   MRN: [id number]   Chief complaint:  Chest pain  79 year old African-American female with nonischemic cardiomyopathy, CAD (PCI RCA), obesity, hypertension, type II diabetes mellitus, morbid obesity, peripheral neuropathy, lupus.  She has stable mild exertional dyspnea.  No new cardiac symptoms at this time.  Her primary symptoms are related to her lupus, and alopecia.  She is seeing a specialist for this.  Current Outpatient Medications on File Prior to Visit  Medication Sig Dispense Refill  . Accu-Chek Softclix Lancets lancets Use as instructed 100 each 3  . acetaminophen (TYLENOL) 325 MG tablet Take 650 mg by mouth every 6 (six) hours as needed for mild pain or moderate pain.    Marland Kitchen allopurinol (ZYLOPRIM) 100 MG tablet Take 1 tablet (100 mg total) by mouth daily. 90 tablet 3  . amLODipine (NORVASC) 10 MG tablet Take 10 mg by mouth daily.    . Ascorbic Acid (VITAMIN C PO) Take 1 tablet by mouth daily.    Marland Kitchen aspirin EC 81 MG tablet Take 81 mg by mouth daily.     Marland Kitchen atorvastatin (LIPITOR) 20 MG tablet Take 1 tablet (20 mg total) by mouth daily. 90 tablet 2  . B Complex Vitamins (VITAMIN B COMPLEX) TABS Take 1 tablet by mouth daily.    . budesonide-formoterol (SYMBICORT) 80-4.5 MCG/ACT inhaler Inhale 2 puffs into the lungs 2 (two) times daily. 1 Inhaler 3  . Cholecalciferol (VITAMIN D3) 5000 UNITS CAPS Take 5,000 Units by mouth daily.    . diclofenac sodium (VOLTAREN) 1 % GEL Apply 4 g topically 4 (four) times daily. 150 g 2  . ferrous sulfate 325 (65 FE) MG tablet Take 325 mg by mouth daily with breakfast.    . furosemide (LASIX) 40 MG tablet Take 40 mg by mouth daily.     Marland Kitchen gabapentin (NEURONTIN) 300 MG capsule Take 1 capsule (300 mg total) by mouth 2 (two) times daily. 60 capsule 3  . glucose blood (ACCU-CHEK AVIVA PLUS) test strip Use to test blood sugar three times daily 100 each 3  . HUMULIN N KWIKPEN 100 UNIT/ML Kiwkpen  ADMINISTER 30 UNITS UNDER THE SKIN EVERY MORNING 30 mL 3  . Insulin Pen Needle (B-D ULTRAFINE III SHORT PEN) 31G X 8 MM MISC CHECK BLOOD SUGAR 3 TIMES A DAY 100 each 3  . levETIRAcetam (KEPPRA) 500 MG tablet Take 1 tablet (500 mg total) by mouth 2 (two) times daily. 180 tablet 3  . metoprolol succinate (TOPROL-XL) 50 MG 24 hr tablet Take 1 tablet (50 mg total) by mouth daily. Take with or immediately following a meal. 90 tablet 1  . montelukast (SINGULAIR) 10 MG tablet Take 1 tablet (10 mg total) by mouth at bedtime. 90 tablet 3  . Multiple Vitamin (MULTIVITAMIN WITH MINERALS) TABS tablet Take 1 tablet by mouth daily.    . nitroGLYCERIN (NITROSTAT) 0.4 MG SL tablet Place 1 tablet (0.4 mg total) under the tongue every 5 (five) minutes as needed for chest pain. 90 tablet 3  . pantoprazole (PROTONIX) 40 MG tablet TAKE 1 TABLET(40 MG) BY MOUTH DAILY 90 tablet 3  . potassium chloride (KLOR-CON) 10 MEQ tablet TAKE 1 TABLET BY MOUTH 2 TIMES A DAY WITH LASIX 60 tablet 3  . PROAIR HFA 108 (90 Base) MCG/ACT inhaler INHALE 2 PUFF BY MOUTH EVERY 4 HOURS AS NEEDED USE ONLY IF YOU ARE WHEEZING 8.5 g 2  . DULoxetine (CYMBALTA)  30 MG capsule TAKE 1 CAPSULE(30 MG) BY MOUTH DAILY 30 capsule 1  . isosorbide mononitrate (IMDUR) 30 MG 24 hr tablet Take 1 tablet (30 mg total) by mouth daily. 90 tablet 3  . memantine (NAMENDA) 10 MG tablet Take 1 tablet (10 mg total) by mouth 2 (two) times daily. 60 tablet 11  . Spacer/Aero-Holding Chambers (AEROCHAMBER PLUS) inhaler Use as instructed to use with inahaler. 1 each 1   No current facility-administered medications on file prior to visit.    Cardiovascular studies:  EKG 10/15/2019: Sinus rhythm 67 bpm. First degree A-V block. Cannot exclude old anterior infarct. Poor R-wave progression. Low voltage.  Echocardiogram 12/14/2017: - Left ventricle: The cavity size was normal. Systolic function was  mildly reduced. The estimated ejection fraction was in the range  of 45%  to 50%. Akinesis of the inferior myocardium. Features are  consistent with a pseudonormal left ventricular filling pattern,  with concomitant abnormal relaxation and increased filling  pressure (grade 2 diastolic dysfunction). - Mitral valve: Mildly thickened leaflets . Chordal calcification.  There was mild regurgitation. Valve area by pressure half-time: 1.47 cm. - Left atrium: The atrium was mildly dilated. - Right atrium: The atrium was mildly dilated. - Pericardium, extracardiac: Trace pericardial effusion was  identified. - Significant improvement in LVEF compared to previous outpatient   echocardiogram in 2018.  Coronary angiography 07/01/2015: 1. Findings consistent with nonischemic cardiomyopathy, LVEF 25-30% with global hypokinesis. 2. Previously placed 2.5 mm DES in the PDA branch of the right coronary artery in 2014 is widely patent. Mid RCA has a 50% stenosis, FFR 0.84, not hemodynamically significant. 3. Diffusely diseased left coronary arteries, especially involving the distal LAD and circumflex coronary artery, may be the etiology for her angina pectoris.   Recent labs: 03/14/2019: Glucose 84, BUN/Cr 40/1.87. EGFR 31. Na/K 143/5.2. Rest of the CMP normal HbA1C 6.5% TSH 1.2 normal  07/2018: H/H 12/37. MCV 74. Platelets 239  2016: Chol 103, TG 81, HDL 38, LDL 49  Review of Systems  Constitution: Negative for decreased appetite, malaise/fatigue, weight gain and weight loss.  HENT: Negative for congestion.   Eyes: Negative for visual disturbance.  Cardiovascular: Positive for chest pain. Negative for dyspnea on exertion, leg swelling, palpitations and syncope.  Respiratory: Negative for cough.   Endocrine: Negative for cold intolerance.  Hematologic/Lymphatic: Does not bruise/bleed easily.  Skin: Negative for itching and rash.       Rash on face  Musculoskeletal: Positive for joint pain. Negative for myalgias.  Gastrointestinal: Negative for abdominal pain, nausea and  vomiting.  Genitourinary: Negative for dysuria.  Neurological: Negative for dizziness and weakness.  Psychiatric/Behavioral: The patient is not nervous/anxious.   All other systems reviewed and are negative.        Vitals:   10/15/19 0949  BP: 135/60  Pulse: 70  Resp: 16  Temp: 97.9 F (36.6 C)  SpO2: 93%       Objective:    Physical Exam  Constitutional: She appears well-developed and well-nourished.  Neck: No JVD present.  Cardiovascular: Normal rate, regular rhythm, normal heart sounds and intact distal pulses.  No murmur heard. Pulmonary/Chest: Effort normal and breath sounds normal. She has no wheezes. She has no rales.  Musculoskeletal:        General: No edema.  Nursing note and vitals reviewed.         Assessment & Recommendations:   79 year old African-American female with nonischemic cardiomyopathy, CAD (PCI RCA), obesity, hypertension, type II diabetes mellitus, morbid  obesity, peripheral neuropathy, lupus.  Chest pain: No recurrent episodes at this time even without Imdur. Likely musculoskeletal pain.  CAD: Stable. Continue Aspirin, metoprolol, statin. Will check lipid panel.  Lupus: Defer management to PCP.  F/u in 1 year.  Nigel Mormon, MD [facility] [place] Pager: [phone] Office: [phone] If no answer Cell [phone]

## 2019-10-15 ENCOUNTER — Other Ambulatory Visit: Payer: Self-pay

## 2019-10-15 ENCOUNTER — Ambulatory Visit (INDEPENDENT_AMBULATORY_CARE_PROVIDER_SITE_OTHER): Payer: Medicare Other | Admitting: Cardiology

## 2019-10-15 ENCOUNTER — Encounter: Payer: Self-pay | Admitting: Cardiology

## 2019-10-15 VITALS — BP 135/60 | HR 70 | Temp 97.9°F | Resp 16 | Ht 63.0 in | Wt 288.0 lb

## 2019-10-15 DIAGNOSIS — M329 Systemic lupus erythematosus, unspecified: Secondary | ICD-10-CM

## 2019-10-15 DIAGNOSIS — I25118 Atherosclerotic heart disease of native coronary artery with other forms of angina pectoris: Secondary | ICD-10-CM

## 2019-10-16 ENCOUNTER — Other Ambulatory Visit: Payer: Self-pay

## 2019-10-17 ENCOUNTER — Encounter: Payer: Self-pay | Admitting: Family Medicine

## 2019-10-17 ENCOUNTER — Ambulatory Visit (INDEPENDENT_AMBULATORY_CARE_PROVIDER_SITE_OTHER): Payer: Medicare Other | Admitting: Family Medicine

## 2019-10-17 VITALS — BP 130/78 | HR 88 | Temp 97.8°F | Resp 16 | Ht 63.0 in | Wt 288.0 lb

## 2019-10-17 DIAGNOSIS — K5900 Constipation, unspecified: Secondary | ICD-10-CM | POA: Diagnosis not present

## 2019-10-17 DIAGNOSIS — M109 Gout, unspecified: Secondary | ICD-10-CM

## 2019-10-17 DIAGNOSIS — M797 Fibromyalgia: Secondary | ICD-10-CM

## 2019-10-17 DIAGNOSIS — I700 Atherosclerosis of aorta: Secondary | ICD-10-CM

## 2019-10-17 DIAGNOSIS — N1831 Chronic kidney disease, stage 3a: Secondary | ICD-10-CM

## 2019-10-17 DIAGNOSIS — J449 Chronic obstructive pulmonary disease, unspecified: Secondary | ICD-10-CM

## 2019-10-17 DIAGNOSIS — E1140 Type 2 diabetes mellitus with diabetic neuropathy, unspecified: Secondary | ICD-10-CM | POA: Diagnosis not present

## 2019-10-17 DIAGNOSIS — I110 Hypertensive heart disease with heart failure: Secondary | ICD-10-CM | POA: Diagnosis not present

## 2019-10-17 DIAGNOSIS — Z6841 Body Mass Index (BMI) 40.0 and over, adult: Secondary | ICD-10-CM

## 2019-10-17 DIAGNOSIS — R1031 Right lower quadrant pain: Secondary | ICD-10-CM

## 2019-10-17 LAB — URIC ACID: Uric Acid, Serum: 9.9 mg/dL — ABNORMAL HIGH (ref 2.4–7.0)

## 2019-10-17 LAB — BASIC METABOLIC PANEL
BUN: 49 mg/dL — ABNORMAL HIGH (ref 6–23)
CO2: 24 mEq/L (ref 19–32)
Calcium: 9.2 mg/dL (ref 8.4–10.5)
Chloride: 106 mEq/L (ref 96–112)
Creatinine, Ser: 1.91 mg/dL — ABNORMAL HIGH (ref 0.40–1.20)
GFR: 30.59 mL/min — ABNORMAL LOW (ref >60.00)
Glucose, Bld: 247 mg/dL — ABNORMAL HIGH (ref 70–99)
Potassium: 5.1 mEq/L (ref 3.5–5.1)
Sodium: 138 mEq/L (ref 135–145)

## 2019-10-17 LAB — HEMOGLOBIN A1C: Hgb A1c MFr Bld: 7.6 % — ABNORMAL HIGH (ref 4.6–6.5)

## 2019-10-17 MED ORDER — AMLODIPINE BESYLATE 5 MG PO TABS: 5.0000 mg | ORAL_TABLET | Freq: Every day | ORAL | 3 refills | Status: DC

## 2019-10-17 MED ORDER — PROAIR HFA 108 (90 BASE) MCG/ACT IN AERS: INHALATION_SPRAY | RESPIRATORY_TRACT | 3 refills | Status: DC

## 2019-10-17 MED ORDER — LINACLOTIDE 145 MCG PO CAPS: 145.0000 ug | ORAL_CAPSULE | Freq: Every day | ORAL | 1 refills | Status: DC

## 2019-10-17 MED ORDER — ALLOPURINOL 100 MG PO TABS: 100.0000 mg | ORAL_TABLET | Freq: Every day | ORAL | 2 refills | Status: DC

## 2019-10-17 NOTE — Patient Instructions (Signed)
A few things to remember from today's visit:   Cymbalta increased to 60 mg. Allopurinol to take daily. No changes in Linzess.  Abdominal CT will be arranged.  Aquatic exercises ideal for fibromyalgia.  Myofascial Pain Syndrome and Fibromyalgia Myofascial pain syndrome and fibromyalgia are both pain disorders. This pain may be felt mainly in your muscles.  Myofascial pain syndrome: ? Always has tender points in the muscle that will cause pain when pressed (trigger points). The pain may come and go. ? Usually affects your neck, upper back, and shoulder areas. The pain often radiates into your arms and hands.  Fibromyalgia: ? Has muscle pains and tenderness that come and go. ? Is often associated with fatigue and sleep problems. ? Has trigger points. ? Tends to be long-lasting (chronic), but is not life-threatening. Fibromyalgia and myofascial pain syndrome are not the same. However, they often occur together. If you have both conditions, each can make the other worse. Both are common and can cause enough pain and fatigue to make day-to-day activities difficult. Both can be hard to diagnose because their symptoms are common in many other conditions. What are the causes? The exact causes of these conditions are not known. What increases the risk? You are more likely to develop this condition if:  You have a family history of the condition.  You have certain triggers, such as: ? Spine disorders. ? An injury (trauma) or other physical stressors. ? Being under a lot of stress. ? Medical conditions such as osteoarthritis, rheumatoid arthritis, or lupus. What are the signs or symptoms? Fibromyalgia The main symptom of fibromyalgia is widespread pain and tenderness in your muscles. Pain is sometimes described as stabbing, shooting, or burning. You may also have:  Tingling or numbness.  Sleep problems and fatigue.  Problems with attention and concentration (fibro fog). Other  symptoms may include:  Bowel and bladder problems.  Headaches.  Visual problems.  Problems with odors and noises.  Depression or mood changes.  Painful menstrual periods (dysmenorrhea).  Dry skin or eyes. These symptoms can vary over time. Myofascial pain syndrome Symptoms of myofascial pain syndrome include:  Tight, ropy bands of muscle.  Uncomfortable sensations in muscle areas. These may include aching, cramping, burning, numbness, tingling, and weakness.  Difficulty moving certain parts of the body freely (poor range of motion). How is this diagnosed? This condition may be diagnosed by your symptoms and medical history. You will also have a physical exam. In general:  Fibromyalgia is diagnosed if you have pain, fatigue, and other symptoms for more than 3 months, and symptoms cannot be explained by another condition.  Myofascial pain syndrome is diagnosed if you have trigger points in your muscles, and those trigger points are tender and cause pain elsewhere in your body (referred pain). How is this treated? Treatment for these conditions depends on the type that you have.  For fibromyalgia: ? Pain medicines, such as NSAIDs. ? Medicines for treating depression. ? Medicines for treating seizures. ? Medicines that relax the muscles.  For myofascial pain: ? Pain medicines, such as NSAIDs. ? Cooling and stretching of muscles. ? Trigger point injections. ? Sound wave (ultrasound) treatments to stimulate muscles. Treating these conditions often requires a team of health care providers. These may include:  Your primary care provider.  Physical therapist.  Complementary health care providers, such as massage therapists or acupuncturists.  Psychiatrist for cognitive behavioral therapy. Follow these instructions at home: Medicines  Take over-the-counter and prescription medicines only as told  by your health care provider.  Do not drive or use heavy machinery while  taking prescription pain medicine.  If you are taking prescription pain medicine, take actions to prevent or treat constipation. Your health care provider may recommend that you: ? Drink enough fluid to keep your urine pale yellow. ? Eat foods that are high in fiber, such as fresh fruits and vegetables, whole grains, and beans. ? Limit foods that are high in fat and processed sugars, such as fried or sweet foods. ? Take an over-the-counter or prescription medicine for constipation. Lifestyle   Exercise as directed by your health care provider or physical therapist.  Practice relaxation techniques to control your stress. You may want to try: ? Biofeedback. ? Visual imagery. ? Hypnosis. ? Muscle relaxation. ? Yoga. ? Meditation.  Maintain a healthy lifestyle. This includes eating a healthy diet and getting enough sleep.  Do not use any products that contain nicotine or tobacco, such as cigarettes and e-cigarettes. If you need help quitting, ask your health care provider. General instructions  Talk to your health care provider about complementary treatments, such as acupuncture or massage.  Consider joining a support group with others who are diagnosed with this condition.  Do not do activities that stress or strain your muscles. This includes repetitive motions and heavy lifting.  Keep all follow-up visits as told by your health care provider. This is important. Where to find more information  National Fibromyalgia Association: www.fmaware.[place]: www.arthritis.org  American Chronic Pain Association: www.theacpa.org Contact a health care provider if:  You have new symptoms.  Your symptoms get worse or your pain is severe.  You have side effects from your medicines.  You have trouble sleeping.  Your condition is causing depression or anxiety. Summary  Myofascial pain syndrome and fibromyalgia are pain disorders.  Myofascial pain syndrome has  tender points in the muscle that will cause pain when pressed (trigger points). Fibromyalgia also has muscle pains and tenderness that come and go, but this condition is often associated with fatigue and sleep disturbances.  Fibromyalgia and myofascial pain syndrome are not the same but often occur together, causing pain and fatigue that make day-to-day activities difficult.  Treatment for fibromyalgia includes taking medicines to relax the muscles and medicines for pain, depression, or seizures. Treatment for myofascial pain syndrome includes taking medicines for pain, cooling and stretching of muscles, and injecting medicines into trigger points.  Follow your health care provider's instructions for taking medicines and maintaining a healthy lifestyle. This information is not intended to replace advice given to you by your health care provider. Make sure you discuss any questions you have with your health care provider. Document Revised: 10/13/2018 Document Reviewed: 07/06/2017 Elsevier Patient Education  [place].  Please be sure medication list is accurate. If a new problem present, please set up appointment sooner than planned today.

## 2019-10-17 NOTE — Progress Notes (Signed)
HPI:   Brittany Roberts is a 79 y.o. female, who is here today for chronic disease management. Last visit was phone visit on 07/17/19.  Since her last visit she has followed with her cardiologist. Neurologist due to memory problems: July 30, 2019.  She does not feel like doing "nothing",becasue she is in pain.  Fibromyalgia,OA,and goat. Allopurinol for gout. Last gout flare up 2 weeks ago. Right great toe very tender,red shiny and mild edema. [name] manages her medications and she thinks she is not taking Allopurinol daily but rather prn.  She wants me to talk with [name] to "make her understand" she cannot do some thinks due to fatigue and aches.  Last month I recommended Duloxetine 30 mg to help with fibromyalgia.She is not sure if she is taking medication but [name] states that she is taking it as prescribed.  She has a walker to help with transfer. She has an aid daily for a few hours to help with ADL's.  Sleeps thought the nigh. According to pt, she had sleep study and was told she does not longer needs O2 supplementations at night or CPAP. She is asking me to have O2 supplier to pick up O2 tank,still getting charged.   +Post nasal drainage, worse in the morning. Today she cough up mucus mixed with blood.  She denies fever,chills,cough,wheezing,or worsening SOB.  Constipation: Once in a while she gets "stopped up." Trying to keep up with fiber and fluid intake. No blood in stool,N/V.  She takes Linzess as needed, which helps.   DM ZO:XWRUEAV problem. BS "up and down." 100's-200's. Negative for polydipsia,polyuria, or polyphagia.  She is on Humulin N 30 U am. She has not noted hypoglycemia like symptoms. She is not checking BS's.  Lab Results  Component Value Date   MICROALBUR 0.7 03/14/2019   MICROALBUR 1.82 11/23/2013   Lab Results  Component Value Date   HGBA1C 6.5 03/14/2019   HTN:  She is checking BP's,does not remember  readings. Negative for visual changes,chest pain,or edema.  She is on Amlodipine 5 mg daily,Metoprolol Succinate 50 mg daily, imdur 30 mg daily. CKD III: She has not noted gross hematuria,foam in urine,or decreased urine output.  COPD/asthma:Albuterol inhaler help with SOB, she is on Symbicort 80-4.5 mcg bid and Singulair 10 mg daily. She does not exercise regularly due to pain.  In regard to her diet, her aid cooks and she is suspecting meals have "a lot of fat."  C/O RLQ abdominal pain, constant for years. Pain noted after cholecystectomy. Burning like sensation. Severe and stable for years. Interferes with sleep sometimes. Alleviated by local massage.  Review of Systems  Constitutional: Negative for activity change and appetite change.  HENT: Negative for mouth sores, nosebleeds and sore throat.   Respiratory: Negative for cough and wheezing.   Cardiovascular: Negative for palpitations.  Gastrointestinal:       Negative for changes in bowel habits.  Genitourinary: Negative for difficulty urinating and dysuria.  Musculoskeletal: Positive for arthralgias, back pain and gait problem.  Skin: Negative for rash and wound.  Allergic/Immunologic: Positive for environmental allergies.  Neurological: Negative for syncope and headaches.  Psychiatric/Behavioral: Negative for confusion. The patient is nervous/anxious.   Rest of ROS, see pertinent positives sand negatives in HPI  Current Outpatient Medications on File Prior to Visit  Medication Sig Dispense Refill  . Accu-Chek Softclix Lancets lancets Use as instructed 100 each 3  . acetaminophen (TYLENOL) 325 MG tablet  Take 650 mg by mouth every 6 (six) hours as needed for mild pain or moderate pain.    . Ascorbic Acid (VITAMIN C PO) Take 1 tablet by mouth daily.    Marland Kitchen aspirin EC 81 MG tablet Take 81 mg by mouth daily.     Marland Kitchen atorvastatin (LIPITOR) 20 MG tablet Take 1 tablet (20 mg total) by mouth daily. 90 tablet 2  . B Complex Vitamins  (VITAMIN B COMPLEX) TABS Take 1 tablet by mouth daily.    . budesonide-formoterol (SYMBICORT) 80-4.5 MCG/ACT inhaler Inhale 2 puffs into the lungs 2 (two) times daily. 1 Inhaler 3  . Cholecalciferol (VITAMIN D3) 5000 UNITS CAPS Take 5,000 Units by mouth daily.    . diclofenac sodium (VOLTAREN) 1 % GEL Apply 4 g topically 4 (four) times daily. 150 g 2  . ferrous sulfate 325 (65 FE) MG tablet Take 325 mg by mouth daily with breakfast.    . furosemide (LASIX) 40 MG tablet Take 40 mg by mouth daily.     Marland Kitchen gabapentin (NEURONTIN) 300 MG capsule Take 1 capsule (300 mg total) by mouth 2 (two) times daily. 60 capsule 3  . glucose blood (ACCU-CHEK AVIVA PLUS) test strip Use to test blood sugar three times daily 100 each 3  . HUMULIN N KWIKPEN 100 UNIT/ML Kiwkpen ADMINISTER 30 UNITS UNDER THE SKIN EVERY MORNING 30 mL 3  . Insulin Pen Needle (B-D ULTRAFINE III SHORT PEN) 31G X 8 MM MISC CHECK BLOOD SUGAR 3 TIMES A DAY 100 each 3  . levETIRAcetam (KEPPRA) 500 MG tablet Take 1 tablet (500 mg total) by mouth 2 (two) times daily. 180 tablet 3  . memantine (NAMENDA) 10 MG tablet Take 1 tablet (10 mg total) by mouth 2 (two) times daily. 60 tablet 11  . metoprolol succinate (TOPROL-XL) 50 MG 24 hr tablet Take 1 tablet (50 mg total) by mouth daily. Take with or immediately following a meal. 90 tablet 1  . montelukast (SINGULAIR) 10 MG tablet Take 1 tablet (10 mg total) by mouth at bedtime. 90 tablet 3  . Multiple Vitamin (MULTIVITAMIN WITH MINERALS) TABS tablet Take 1 tablet by mouth daily.    . pantoprazole (PROTONIX) 40 MG tablet TAKE 1 TABLET(40 MG) BY MOUTH DAILY 90 tablet 3  . potassium chloride (KLOR-CON) 10 MEQ tablet TAKE 1 TABLET BY MOUTH 2 TIMES A DAY WITH LASIX 60 tablet 3  . Spacer/Aero-Holding Chambers (AEROCHAMBER PLUS) inhaler Use as instructed to use with inahaler. 1 each 1  . isosorbide mononitrate (IMDUR) 30 MG 24 hr tablet Take 1 tablet (30 mg total) by mouth daily. 90 tablet 3  . nitroGLYCERIN  (NITROSTAT) 0.4 MG SL tablet Place 1 tablet (0.4 mg total) under the tongue every 5 (five) minutes as needed for chest pain. 90 tablet 3   No current facility-administered medications on file prior to visit.   Past Medical History:  Diagnosis Date  . Acute delirium 01/14/2017  . Anxiety   . Arthritis    "all over"  . Asthma   . Bipolar disorder ([place])   . Complication of anesthesia    "w/right foot OR they gave me too much and they couldn't get me woke"  . Congestive heart failure, unspecified   . Coronary artery calcification seen on CAT scan 01/14/2017  . Coronary artery disease    "I've got 1 stent" (06/30/2015)  . Depression   . Discoid lupus   . Fibromyalgia    "I've been told I have this;  can't take Lyrica cause I'm allergic to it" (06/30/2015)  . GERD (gastroesophageal reflux disease)   . History of gout   . History of hiatal hernia    "real bad" (06/30/2015)  . Hypertension   . Hypothyroidism   . Memory loss   . On home oxygen therapy    "2L q hs" (06/30/2015)  . OSA (obstructive sleep apnea)    "just use my oxygen; no mask" (06/30/2015)  . Other and unspecified hyperlipidemia   . Penetrating foot wound    left nonhealing foot wound on the dorsal surface  . Peripheral neuropathy   . Pneumonia "many of times"  . Refusal of blood transfusions as patient is Jehovah's Witness   . Renal failure, unspecified    "kidneys not working 100%" (06/30/2015)  . Seizure ([place])   . Sickle cell trait ([place])   . Systemic lupus ([place])   . Thoracic aortic atherosclerosis ([place]) 01/14/2017  . Type II diabetes mellitus ([place])   . Unspecified vitamin D deficiency    Allergies  Allergen Reactions  . Ace Inhibitors Other (See Comments)    Coincided with sig bump in creat. Retried and creat bumped again. Coincided with sig bump in creat. Retried and creat bumped again. Coincided with sig bump in creat. Retried and creat bumped again. Other reaction(s): Other (See Comments) Coincided with  sig bump in creat. Retried and creat bumped again. Coincided with sig bump in creat. Retried and creat bumped again.  . Isosorb Dinitrate-Hydralazine Other (See Comments)    Sleep all the time. Sleep all the time. Other reaction(s): Other (See Comments) Sleep all the time.  Marland Kitchen Penicillins Anaphylaxis, Swelling and Rash    Has patient had a PCN reaction causing immediate rash, facial/tongue/throat swelling, SOB or lightheadedness with hypotension: Yes Has patient had a PCN reaction causing severe rash involving mucus membranes or skin necrosis: Yes Has patient had a PCN reaction that required hospitalization: Yes Has patient had a PCN reaction occurring within the last 10 years: No If all of the above answers are "NO", then may proceed with Cephalosporin use.   Marland Kitchen Buspirone Other (See Comments)    pain pain pain Other reaction(s): Other (See Comments) pain pain  . Pregabalin Swelling  . Ropinirole Hydrochloride Swelling  . [place]adines Rash    "Swelling of the tongue"    Social History   Socioeconomic History  . Marital status: Single    Spouse name: Not on file  . Number of children: 4  . Years of education: 32 th  . Highest education level: Not on file  Occupational History  . Occupation: Retired- Engineer, production    Employer: RETIRED  Tobacco Use  . Smoking status: Former Smoker    Packs/day: 3.00    Years: 40.00    Pack years: 120.00    Types: Cigarettes    Start date: 07/05/1960    Quit date: 07/05/1998    Years since quitting: 21.3  . Smokeless tobacco: Never Used  Substance and Sexual Activity  . Alcohol use: No    Alcohol/week: 0.0 standard drinks    Comment: havent drank in over 40 years  . Drug use: No  . Sexual activity: Never  Other Topics Concern  . Not on file  Social History Narrative   Health Care POA:    Emergency Contact: Roberts, Lorenso Courier [phone] or [name] [phone]   End of Life Plan: Does not want to be ventilated or feeding  tubes.    Who lives  with you: Lives alone   Any pets: none   Diet: Patient reports enjoying and eating junk food.  Does not regulate types of food and currently her dentures are broken so it is hard to eat some foods.   Exercise: Patient does not have an exercise plan.   Seatbelts: Patient reports wearing seatbelt when in vehicle.   Sun Exposure/Protection: Patient reports not going outside very often.   Hobbies: Patient enjoys reading the bible and watching game shows.    Right-handed.   1 cup caffeine per day.   Former smoker-stopped 2000   Alcohol none   Social Determinants of Radio broadcast assistant Strain:   . Difficulty of Paying Living Expenses:   Food Insecurity:   . Worried About Charity fundraiser in the Last Year:   . Arboriculturist in the Last Year:   Transportation Needs:   . Film/video editor (Medical):   Marland Kitchen Lack of Transportation (Non-Medical):   Physical Activity:   . Days of Exercise per Week:   . Minutes of Exercise per Session:   Stress:   . Feeling of Stress :   Social Connections:   . Frequency of Communication with Friends and Family:   . Frequency of Social Gatherings with Friends and Family:   . Attends Religious Services:   . Active Member of Clubs or Organizations:   . Attends Archivist Meetings:   Marland Kitchen Marital Status:     Vitals:   10/17/19 0928  BP: 130/78  Pulse: 88  Resp: 16  Temp: 97.8 F (36.6 C)  SpO2: 96%   Body mass index is 51.02 kg/m.  Physical Exam  Nursing note and vitals reviewed. Constitutional: She is oriented to person, place, and time. She appears well-developed. No distress.  HENT:  Head: Normocephalic and atraumatic.  Mouth/Throat: Oropharynx is clear and moist and mucous membranes are normal. She has dentures.  Eyes: Pupils are equal, round, and reactive to light. Conjunctivae are normal.  Cardiovascular: Normal rate and regular rhythm.  No murmur heard. Pulses:      Dorsalis pedis pulses are  2+ on the right side and 2+ on the left side.  Respiratory: Effort normal and breath sounds normal. No respiratory distress.  GI: Soft. She exhibits no mass. There is abdominal tenderness in the right lower quadrant.    Musculoskeletal:        General: No edema.  Lymphadenopathy:    She has no cervical adenopathy.  Neurological: She is alert and oriented to person, place, and time. She has normal strength.  Gait assisted by a walker.  Skin: Skin is warm. No rash noted. No erythema.  Psychiatric: Her mood appears anxious. Her affect is labile.  Well groomed, good eye contact.   Diabetic Foot Exam - Simple   Simple Foot Form Diabetic Foot exam was performed with the following findings: Yes 10/17/2019  2:47 PM  Visual Inspection See comments: Yes Sensation Testing See comments: Yes Pulse Check Posterior Tibialis and Dorsalis pulse intact bilaterally: Yes Comments Callus and dry skin Decreased monofilament R>L.      ASSESSMENT AND PLAN:   Brittany. ABUK SELLECK was seen today for chronic disease management.  Orders Placed This Encounter  Procedures  . CT Abdomen Pelvis Wo Contrast  . Uric acid  . Hemoglobin A1c  . Fructosamine  . Basic metabolic panel   Lab Results  Component Value Date   HGBA1C 7.6 (H) 10/17/2019   Lab Results  Component Value Date   CREATININE 1.91 (H) 10/17/2019   BUN 49 (H) 10/17/2019   NA 138 10/17/2019   K 5.1 10/17/2019   CL 106 10/17/2019   CO2 24 10/17/2019    1. Constipation, unspecified constipation type Well controlled. Adequate fiber and fluid intake. No changes in current management.  - linaclotide (LINZESS) 145 MCG CAPS capsule; Take 1 capsule (145 mcg total) by mouth daily before breakfast.  Dispense: 90 capsule; Refill: 1  2. Controlled type 2 diabetes with neuropathy ([place]) HgA1C has been at goal. No changes in current management. Regular exercise as tolerated and healthy diet with avoidance of added sugar food intake is an  important part of treatment and recommended. Annual eye exam and foot care recommended. F/U in 5-6 months  3. Morbid obesity with BMI of 45.0-49.9, adult ([place]) We discussed benefits of wt loss as well as adverse effects of obesity. Consistency with healthy diet and low impact physical activity recommended.  4. Chronic obstructive pulmonary disease, unspecified COPD type ([place]) Well controlled. No changes in current management.  - PROAIR HFA 108 (90 Base) MCG/ACT inhaler; INHALE 2 PUFF BY MOUTH EVERY 4 HOURS AS NEEDED USE ONLY IF YOU ARE WHEEZING  Dispense: 8.5 g; Refill: 3  5. Hypertensive heart disease with heart failure (H[facility]) BP adequately well controlled. Continue low salt diet. No changes in current management.  - amLODipine (NORVASC) 5 MG tablet; Take 1 tablet (5 mg total) by mouth daily.  Dispense: 90 tablet; Refill: 3  6. Atherosclerosis of aorta ([place]) She is on Aspirin 81 mg daily and Atorvastatin 20 mg daily.  7. Gout, arthropathy Spoke with Roberts,brought to the room later during visit. Explained that medication is to take daily. Low purine diet to continue.  - allopurinol (ZYLOPRIM) 100 MG tablet; Take 1 tablet (100 mg total) by mouth daily.  Dispense: 90 tablet; Refill: 2  8. Stage 3a chronic kidney disease Adequate BP and glucose controled. Low salt diet and adequate hydration. Continue avoiding NSAID's.   9. Right lower quadrant abdominal pain Severe and chronic. Abdominal CT will be arranged. Instructed about warning signs.  10. Fibromyalgia Educated about disease and prognosis. Low impact exercise. Cymbalta dose increased from 30 mg to 60 mg. She is also on Gabapentin for neuropathy. Cymbalta may also helps with depressed mood.  40 min face to face OV. > 50% was dedicated to discussion of Dx, prognosis, and some side effects of medications with Brittany Roberts.  Return in about 4 months (around 02/16/2020) for  DM,HTN,gout,fibromyalgia..    Rosland Riding G. [name], MD  [facility]. [place] office.   A few things to remember from today's visit:   Cymbalta increased to 60 mg. Allopurinol to take daily. No changes in Linzess.  Abdominal CT will be arranged.  Aquatic exercises ideal for fibromyalgia.  Myofascial Pain Syndrome and Fibromyalgia Myofascial pain syndrome and fibromyalgia are both pain disorders. This pain may be felt mainly in your muscles.  Myofascial pain syndrome: ? Always has tender points in the muscle that will cause pain when pressed (trigger points). The pain may come and go. ? Usually affects your neck, upper back, and shoulder areas. The pain often radiates into your arms and hands.  Fibromyalgia: ? Has muscle pains and tenderness that come and go. ? Is often associated with fatigue and sleep problems. ? Has trigger points. ? Tends to be long-lasting (chronic), but is not life-threatening. Fibromyalgia and myofascial pain syndrome are not the same.  However, they often occur together. If you have both conditions, each can make the other worse. Both are common and can cause enough pain and fatigue to make day-to-day activities difficult. Both can be hard to diagnose because their symptoms are common in many other conditions. What are the causes? The exact causes of these conditions are not known. What increases the risk? You are more likely to develop this condition if:  You have a family history of the condition.  You have certain triggers, such as: ? Spine disorders. ? An injury (trauma) or other physical stressors. ? Being under a lot of stress. ? Medical conditions such as osteoarthritis, rheumatoid arthritis, or lupus. What are the signs or symptoms? Fibromyalgia The main symptom of fibromyalgia is widespread pain and tenderness in your muscles. Pain is sometimes described as stabbing, shooting, or burning. You may also have:  Tingling or  numbness.  Sleep problems and fatigue.  Problems with attention and concentration (fibro fog). Other symptoms may include:  Bowel and bladder problems.  Headaches.  Visual problems.  Problems with odors and noises.  Depression or mood changes.  Painful menstrual periods (dysmenorrhea).  Dry skin or eyes. These symptoms can vary over time. Myofascial pain syndrome Symptoms of myofascial pain syndrome include:  Tight, ropy bands of muscle.  Uncomfortable sensations in muscle areas. These may include aching, cramping, burning, numbness, tingling, and weakness.  Difficulty moving certain parts of the body freely (poor range of motion). How is this diagnosed? This condition may be diagnosed by your symptoms and medical history. You will also have a physical exam. In general:  Fibromyalgia is diagnosed if you have pain, fatigue, and other symptoms for more than 3 months, and symptoms cannot be explained by another condition.  Myofascial pain syndrome is diagnosed if you have trigger points in your muscles, and those trigger points are tender and cause pain elsewhere in your body (referred pain). How is this treated? Treatment for these conditions depends on the type that you have.  For fibromyalgia: ? Pain medicines, such as NSAIDs. ? Medicines for treating depression. ? Medicines for treating seizures. ? Medicines that relax the muscles.  For myofascial pain: ? Pain medicines, such as NSAIDs. ? Cooling and stretching of muscles. ? Trigger point injections. ? Sound wave (ultrasound) treatments to stimulate muscles. Treating these conditions often requires a team of health care providers. These may include:  Your primary care provider.  Physical therapist.  Complementary health care providers, such as massage therapists or acupuncturists.  Psychiatrist for cognitive behavioral therapy. Follow these instructions at home: Medicines  Take over-the-counter and  prescription medicines only as told by your health care provider.  Do not drive or use heavy machinery while taking prescription pain medicine.  If you are taking prescription pain medicine, take actions to prevent or treat constipation. Your health care provider may recommend that you: ? Drink enough fluid to keep your urine pale yellow. ? Eat foods that are high in fiber, such as fresh fruits and vegetables, whole grains, and beans. ? Limit foods that are high in fat and processed sugars, such as fried or sweet foods. ? Take an over-the-counter or prescription medicine for constipation. Lifestyle   Exercise as directed by your health care provider or physical therapist.  Practice relaxation techniques to control your stress. You may want to try: ? Biofeedback. ? Visual imagery. ? Hypnosis. ? Muscle relaxation. ? Yoga. ? Meditation.  Maintain a healthy lifestyle. This includes eating a healthy diet  and getting enough sleep.  Do not use any products that contain nicotine or tobacco, such as cigarettes and e-cigarettes. If you need help quitting, ask your health care provider. General instructions  Talk to your health care provider about complementary treatments, such as acupuncture or massage.  Consider joining a support group with others who are diagnosed with this condition.  Do not do activities that stress or strain your muscles. This includes repetitive motions and heavy lifting.  Keep all follow-up visits as told by your health care provider. This is important. Where to find more information  National Fibromyalgia Association: www.fmaware.[place]: www.arthritis.org  American Chronic Pain Association: www.theacpa.org Contact a health care provider if:  You have new symptoms.  Your symptoms get worse or your pain is severe.  You have side effects from your medicines.  You have trouble sleeping.  Your condition is causing depression or  anxiety. Summary  Myofascial pain syndrome and fibromyalgia are pain disorders.  Myofascial pain syndrome has tender points in the muscle that will cause pain when pressed (trigger points). Fibromyalgia also has muscle pains and tenderness that come and go, but this condition is often associated with fatigue and sleep disturbances.  Fibromyalgia and myofascial pain syndrome are not the same but often occur together, causing pain and fatigue that make day-to-day activities difficult.  Treatment for fibromyalgia includes taking medicines to relax the muscles and medicines for pain, depression, or seizures. Treatment for myofascial pain syndrome includes taking medicines for pain, cooling and stretching of muscles, and injecting medicines into trigger points.  Follow your health care provider's instructions for taking medicines and maintaining a healthy lifestyle. This information is not intended to replace advice given to you by your health care provider. Make sure you discuss any questions you have with your health care provider. Document Revised: 10/13/2018 Document Reviewed: 07/06/2017 Elsevier Patient Education  [place].  Please be sure medication list is accurate. If a new problem present, please set up appointment sooner than planned today.

## 2019-10-19 ENCOUNTER — Encounter: Payer: Self-pay | Admitting: Family Medicine

## 2019-10-19 LAB — FRUCTOSAMINE: Fructosamine: 390 umol/L — ABNORMAL HIGH (ref 205–285)

## 2019-10-19 NOTE — Progress Notes (Signed)
Patient stated that she wanted [facility] to pick up O2 machine because she hadn't used it in 4 months. Prescription to D/c O2 machine faxed to [facility].

## 2019-10-20 ENCOUNTER — Encounter: Payer: Self-pay | Admitting: Family Medicine

## 2019-10-20 MED ORDER — DULOXETINE HCL 60 MG PO CPEP: 60.0000 mg | ORAL_CAPSULE | Freq: Every day | ORAL | 2 refills | Status: DC

## 2019-10-29 ENCOUNTER — Ambulatory Visit: Payer: Self-pay

## 2019-11-01 ENCOUNTER — Inpatient Hospital Stay (HOSPITAL_COMMUNITY)
Admission: EM | Admit: 2019-11-01 | Discharge: 2019-11-08 | DRG: 177 | Disposition: A | Discharge status: Skilled Nursing Facility | Payer: Medicare Other | Attending: Internal Medicine | Admitting: Internal Medicine

## 2019-11-01 ENCOUNTER — Other Ambulatory Visit: Payer: Self-pay

## 2019-11-01 ENCOUNTER — Emergency Department (HOSPITAL_COMMUNITY): Payer: Medicare Other

## 2019-11-01 ENCOUNTER — Encounter (HOSPITAL_COMMUNITY): Payer: Self-pay | Admitting: Emergency Medicine

## 2019-11-01 DIAGNOSIS — E1149 Type 2 diabetes mellitus with other diabetic neurological complication: Secondary | ICD-10-CM | POA: Diagnosis not present

## 2019-11-01 DIAGNOSIS — E785 Hyperlipidemia, unspecified: Secondary | ICD-10-CM | POA: Diagnosis present

## 2019-11-01 DIAGNOSIS — N1831 Chronic kidney disease, stage 3a: Secondary | ICD-10-CM | POA: Diagnosis not present

## 2019-11-01 DIAGNOSIS — I5042 Chronic combined systolic (congestive) and diastolic (congestive) heart failure: Secondary | ICD-10-CM | POA: Diagnosis present

## 2019-11-01 DIAGNOSIS — F319 Bipolar disorder, unspecified: Secondary | ICD-10-CM | POA: Diagnosis present

## 2019-11-01 DIAGNOSIS — F0390 Unspecified dementia without behavioral disturbance: Secondary | ICD-10-CM | POA: Diagnosis present

## 2019-11-01 DIAGNOSIS — Z794 Long term (current) use of insulin: Secondary | ICD-10-CM

## 2019-11-01 DIAGNOSIS — Z531 Procedure and treatment not carried out because of patient's decision for reasons of belief and group pressure: Secondary | ICD-10-CM | POA: Diagnosis present

## 2019-11-01 DIAGNOSIS — U071 COVID-19: Secondary | ICD-10-CM | POA: Diagnosis present

## 2019-11-01 DIAGNOSIS — K219 Gastro-esophageal reflux disease without esophagitis: Secondary | ICD-10-CM | POA: Diagnosis not present

## 2019-11-01 DIAGNOSIS — M329 Systemic lupus erythematosus, unspecified: Secondary | ICD-10-CM | POA: Diagnosis present

## 2019-11-01 DIAGNOSIS — I959 Hypotension, unspecified: Secondary | ICD-10-CM | POA: Diagnosis not present

## 2019-11-01 DIAGNOSIS — E875 Hyperkalemia: Secondary | ICD-10-CM | POA: Diagnosis present

## 2019-11-01 DIAGNOSIS — Z7951 Long term (current) use of inhaled steroids: Secondary | ICD-10-CM

## 2019-11-01 DIAGNOSIS — Z8249 Family history of ischemic heart disease and other diseases of the circulatory system: Secondary | ICD-10-CM

## 2019-11-01 DIAGNOSIS — E7800 Pure hypercholesterolemia, unspecified: Secondary | ICD-10-CM | POA: Diagnosis not present

## 2019-11-01 DIAGNOSIS — J449 Chronic obstructive pulmonary disease, unspecified: Secondary | ICD-10-CM | POA: Diagnosis present

## 2019-11-01 DIAGNOSIS — E039 Hypothyroidism, unspecified: Secondary | ICD-10-CM | POA: Diagnosis present

## 2019-11-01 DIAGNOSIS — J1282 Pneumonia due to coronavirus disease 2019: Secondary | ICD-10-CM | POA: Diagnosis present

## 2019-11-01 DIAGNOSIS — M6281 Muscle weakness (generalized): Secondary | ICD-10-CM | POA: Diagnosis not present

## 2019-11-01 DIAGNOSIS — E1122 Type 2 diabetes mellitus with diabetic chronic kidney disease: Secondary | ICD-10-CM | POA: Diagnosis present

## 2019-11-01 DIAGNOSIS — Z79899 Other long term (current) drug therapy: Secondary | ICD-10-CM

## 2019-11-01 DIAGNOSIS — M797 Fibromyalgia: Secondary | ICD-10-CM | POA: Diagnosis present

## 2019-11-01 DIAGNOSIS — I2510 Atherosclerotic heart disease of native coronary artery without angina pectoris: Secondary | ICD-10-CM | POA: Diagnosis present

## 2019-11-01 DIAGNOSIS — M2550 Pain in unspecified joint: Secondary | ICD-10-CM | POA: Diagnosis not present

## 2019-11-01 DIAGNOSIS — D696 Thrombocytopenia, unspecified: Secondary | ICD-10-CM | POA: Diagnosis not present

## 2019-11-01 DIAGNOSIS — Z7982 Long term (current) use of aspirin: Secondary | ICD-10-CM

## 2019-11-01 DIAGNOSIS — Z66 Do not resuscitate: Secondary | ICD-10-CM | POA: Diagnosis not present

## 2019-11-01 DIAGNOSIS — R0902 Hypoxemia: Secondary | ICD-10-CM | POA: Diagnosis not present

## 2019-11-01 DIAGNOSIS — I130 Hypertensive heart and chronic kidney disease with heart failure and stage 1 through stage 4 chronic kidney disease, or unspecified chronic kidney disease: Secondary | ICD-10-CM | POA: Diagnosis not present

## 2019-11-01 DIAGNOSIS — N179 Acute kidney failure, unspecified: Secondary | ICD-10-CM | POA: Diagnosis not present

## 2019-11-01 DIAGNOSIS — Z825 Family history of asthma and other chronic lower respiratory diseases: Secondary | ICD-10-CM

## 2019-11-01 DIAGNOSIS — R079 Chest pain, unspecified: Secondary | ICD-10-CM

## 2019-11-01 DIAGNOSIS — E1140 Type 2 diabetes mellitus with diabetic neuropathy, unspecified: Secondary | ICD-10-CM | POA: Diagnosis not present

## 2019-11-01 DIAGNOSIS — G4730 Sleep apnea, unspecified: Secondary | ICD-10-CM | POA: Diagnosis not present

## 2019-11-01 DIAGNOSIS — Z8261 Family history of arthritis: Secondary | ICD-10-CM

## 2019-11-01 DIAGNOSIS — Z6841 Body Mass Index (BMI) 40.0 and over, adult: Secondary | ICD-10-CM

## 2019-11-01 DIAGNOSIS — D573 Sickle-cell trait: Secondary | ICD-10-CM | POA: Diagnosis present

## 2019-11-01 DIAGNOSIS — R509 Fever, unspecified: Secondary | ICD-10-CM | POA: Diagnosis not present

## 2019-11-01 DIAGNOSIS — Z841 Family history of disorders of kidney and ureter: Secondary | ICD-10-CM

## 2019-11-01 DIAGNOSIS — J9601 Acute respiratory failure with hypoxia: Secondary | ICD-10-CM | POA: Diagnosis not present

## 2019-11-01 DIAGNOSIS — J984 Other disorders of lung: Secondary | ICD-10-CM | POA: Diagnosis not present

## 2019-11-01 DIAGNOSIS — J440 Chronic obstructive pulmonary disease with acute lower respiratory infection: Secondary | ICD-10-CM | POA: Diagnosis present

## 2019-11-01 DIAGNOSIS — Z7401 Bed confinement status: Secondary | ICD-10-CM | POA: Diagnosis not present

## 2019-11-01 DIAGNOSIS — R062 Wheezing: Secondary | ICD-10-CM | POA: Diagnosis not present

## 2019-11-01 DIAGNOSIS — R1312 Dysphagia, oropharyngeal phase: Secondary | ICD-10-CM | POA: Diagnosis not present

## 2019-11-01 DIAGNOSIS — R2689 Other abnormalities of gait and mobility: Secondary | ICD-10-CM | POA: Diagnosis not present

## 2019-11-01 DIAGNOSIS — J80 Acute respiratory distress syndrome: Secondary | ICD-10-CM | POA: Diagnosis not present

## 2019-11-01 DIAGNOSIS — Z87891 Personal history of nicotine dependence: Secondary | ICD-10-CM

## 2019-11-01 DIAGNOSIS — R000 Tachycardia, unspecified: Secondary | ICD-10-CM | POA: Diagnosis not present

## 2019-11-01 DIAGNOSIS — Z832 Family history of diseases of the blood and blood-forming organs and certain disorders involving the immune mechanism: Secondary | ICD-10-CM

## 2019-11-01 DIAGNOSIS — Z833 Family history of diabetes mellitus: Secondary | ICD-10-CM

## 2019-11-01 DIAGNOSIS — Z743 Need for continuous supervision: Secondary | ICD-10-CM | POA: Diagnosis not present

## 2019-11-01 DIAGNOSIS — J9690 Respiratory failure, unspecified, unspecified whether with hypoxia or hypercapnia: Secondary | ICD-10-CM

## 2019-11-01 DIAGNOSIS — R9431 Abnormal electrocardiogram [ECG] [EKG]: Secondary | ICD-10-CM | POA: Diagnosis not present

## 2019-11-01 LAB — CBC WITH DIFFERENTIAL/PLATELET
Abs Immature Granulocytes: 0.06 10*3/uL (ref 0.00–0.07)
Basophils Absolute: 0 10*3/uL (ref 0.0–0.1)
Basophils Relative: 0 %
Eosinophils Absolute: 0 10*3/uL (ref 0.0–0.5)
Eosinophils Relative: 0 %
HCT: 36.8 % (ref 36.0–46.0)
Hemoglobin: 12.6 g/dL (ref 12.0–15.0)
Immature Granulocytes: 1 %
Lymphocytes Relative: 30 %
Lymphs Abs: 2.1 10*3/uL (ref 0.7–4.0)
MCH: 25.8 pg — ABNORMAL LOW (ref 26.0–34.0)
MCHC: 34.2 g/dL (ref 30.0–36.0)
MCV: 75.3 fL — ABNORMAL LOW (ref 80.0–100.0)
Monocytes Absolute: 0.7 10*3/uL (ref 0.1–1.0)
Monocytes Relative: 9 %
Neutro Abs: 4.1 10*3/uL (ref 1.7–7.7)
Neutrophils Relative %: 60 %
Platelets: 108 10*3/uL — ABNORMAL LOW (ref 150–400)
RBC: 4.89 MIL/uL (ref 3.87–5.11)
RDW: 13.8 % (ref 11.5–15.5)
WBC: 6.9 10*3/uL (ref 4.0–10.5)
nRBC: 0 % (ref 0.0–0.2)

## 2019-11-01 LAB — URINALYSIS, ROUTINE W REFLEX MICROSCOPIC
Bacteria, UA: NONE SEEN
Bilirubin Urine: NEGATIVE
Glucose, UA: NEGATIVE mg/dL
Hgb urine dipstick: NEGATIVE
Ketones, ur: NEGATIVE mg/dL
Leukocytes,Ua: NEGATIVE
Nitrite: NEGATIVE
Protein, ur: 100 mg/dL — AB
Specific Gravity, Urine: 1.013 (ref 1.005–1.030)
pH: 5 (ref 5.0–8.0)

## 2019-11-01 LAB — COMPREHENSIVE METABOLIC PANEL
ALT: 29 U/L (ref 0–44)
AST: 52 U/L — ABNORMAL HIGH (ref 15–41)
Albumin: 3.2 g/dL — ABNORMAL LOW (ref 3.5–5.0)
Alkaline Phosphatase: 79 U/L (ref 38–126)
Anion gap: 8 (ref 5–15)
BUN: 51 mg/dL — ABNORMAL HIGH (ref 8–23)
CO2: 22 mmol/L (ref 22–32)
Calcium: 8.5 mg/dL — ABNORMAL LOW (ref 8.9–10.3)
Chloride: 107 mmol/L (ref 98–111)
Creatinine, Ser: 2.27 mg/dL — ABNORMAL HIGH (ref 0.44–1.00)
GFR calc Af Amer: 23 mL/min — ABNORMAL LOW (ref >60)
GFR calc non Af Amer: 20 mL/min — ABNORMAL LOW (ref >60)
Glucose, Bld: 95 mg/dL (ref 70–99)
Potassium: 4.9 mmol/L (ref 3.5–5.1)
Sodium: 137 mmol/L (ref 135–145)
Total Bilirubin: 0.5 mg/dL (ref 0.3–1.2)
Total Protein: 7.9 g/dL (ref 6.5–8.1)

## 2019-11-01 LAB — D-DIMER, QUANTITATIVE: D-Dimer, Quant: 6.6 ug/mL-FEU — ABNORMAL HIGH (ref 0.00–0.50)

## 2019-11-01 LAB — C-REACTIVE PROTEIN: CRP: 5.6 mg/dL — ABNORMAL HIGH (ref <1.0)

## 2019-11-01 LAB — LACTIC ACID, PLASMA: Lactic Acid, Venous: 1 mmol/L (ref 0.5–1.9)

## 2019-11-01 LAB — POC SARS CORONAVIRUS 2 AG -  ED: SARS Coronavirus 2 Ag: POSITIVE — AB

## 2019-11-01 LAB — CBG MONITORING, ED
Glucose-Capillary: 124 mg/dL — ABNORMAL HIGH (ref 70–99)
Glucose-Capillary: 193 mg/dL — ABNORMAL HIGH (ref 70–99)

## 2019-11-01 LAB — PROTIME-INR
INR: 1 (ref 0.8–1.2)
Prothrombin Time: 12.4 seconds (ref 11.4–15.2)

## 2019-11-01 LAB — APTT: aPTT: 25 seconds (ref 24–36)

## 2019-11-01 LAB — FERRITIN: Ferritin: 941 ng/mL — ABNORMAL HIGH (ref 11–307)

## 2019-11-01 LAB — PROCALCITONIN: Procalcitonin: 0.42 ng/mL

## 2019-11-01 LAB — BRAIN NATRIURETIC PEPTIDE: B Natriuretic Peptide: 111.4 pg/mL — ABNORMAL HIGH (ref 0.0–100.0)

## 2019-11-01 MED ORDER — SODIUM CHLORIDE 0.9 % IV SOLN
100.0000 mg | INTRAVENOUS | Status: AC
  Administered 2019-11-01 (×2): 100 mg via INTRAVENOUS
  Filled 2019-11-01 (×2): qty 20

## 2019-11-01 MED ORDER — METOPROLOL SUCCINATE ER 50 MG PO TB24
50.0000 mg | ORAL_TABLET | Freq: Every day | ORAL | Status: DC
  Administered 2019-11-02 – 2019-11-08 (×7): 50 mg via ORAL
  Filled 2019-11-01 (×7): qty 1

## 2019-11-01 MED ORDER — ACETAMINOPHEN 325 MG PO TABS
650.0000 mg | ORAL_TABLET | Freq: Once | ORAL | Status: AC | PRN
  Administered 2019-11-01: 650 mg via ORAL
  Filled 2019-11-01: qty 2

## 2019-11-01 MED ORDER — ASPIRIN EC 81 MG PO TBEC
81.0000 mg | DELAYED_RELEASE_TABLET | Freq: Every day | ORAL | Status: DC
  Administered 2019-11-01 – 2019-11-08 (×8): 81 mg via ORAL
  Filled 2019-11-01 (×8): qty 1

## 2019-11-01 MED ORDER — ATORVASTATIN CALCIUM 20 MG PO TABS
20.0000 mg | ORAL_TABLET | Freq: Every day | ORAL | Status: DC
  Administered 2019-11-01 – 2019-11-08 (×7): 20 mg via ORAL
  Filled 2019-11-01 (×3): qty 1
  Filled 2019-11-01 (×3): qty 2
  Filled 2019-11-01: qty 1

## 2019-11-01 MED ORDER — ALBUTEROL SULFATE HFA 108 (90 BASE) MCG/ACT IN AERS
2.0000 | INHALATION_SPRAY | RESPIRATORY_TRACT | Status: DC | PRN
  Filled 2019-11-01: qty 6.7

## 2019-11-01 MED ORDER — MEMANTINE HCL 10 MG PO TABS
10.0000 mg | ORAL_TABLET | Freq: Two times a day (BID) | ORAL | Status: DC
  Administered 2019-11-02 – 2019-11-08 (×13): 10 mg via ORAL
  Filled 2019-11-01 (×13): qty 1

## 2019-11-01 MED ORDER — LEVOFLOXACIN IN D5W 750 MG/150ML IV SOLN
750.0000 mg | Freq: Once | INTRAVENOUS | Status: AC
  Administered 2019-11-01: 750 mg via INTRAVENOUS
  Filled 2019-11-01: qty 150

## 2019-11-01 MED ORDER — INSULIN ASPART 100 UNIT/ML ~~LOC~~ SOLN
0.0000 [IU] | Freq: Every day | SUBCUTANEOUS | Status: DC
  Administered 2019-11-02 – 2019-11-04 (×3): 2 [IU] via SUBCUTANEOUS
  Administered 2019-11-05: 3 [IU] via SUBCUTANEOUS
  Administered 2019-11-06: 2 [IU] via SUBCUTANEOUS
  Filled 2019-11-01: qty 0.05

## 2019-11-01 MED ORDER — AMLODIPINE BESYLATE 5 MG PO TABS
5.0000 mg | ORAL_TABLET | Freq: Every day | ORAL | Status: DC
  Administered 2019-11-01 – 2019-11-08 (×8): 5 mg via ORAL
  Filled 2019-11-01 (×8): qty 1

## 2019-11-01 MED ORDER — GUAIFENESIN ER 600 MG PO TB12
600.0000 mg | ORAL_TABLET | Freq: Two times a day (BID) | ORAL | Status: DC
  Administered 2019-11-01 – 2019-11-08 (×14): 600 mg via ORAL
  Filled 2019-11-01 (×16): qty 1

## 2019-11-01 MED ORDER — DULOXETINE HCL 60 MG PO CPEP
60.0000 mg | ORAL_CAPSULE | Freq: Every day | ORAL | Status: DC
  Administered 2019-11-01 – 2019-11-08 (×8): 60 mg via ORAL
  Filled 2019-11-01: qty 2
  Filled 2019-11-01 (×2): qty 1
  Filled 2019-11-01: qty 2
  Filled 2019-11-01: qty 1
  Filled 2019-11-01: qty 2
  Filled 2019-11-01: qty 1
  Filled 2019-11-01: qty 2

## 2019-11-01 MED ORDER — INSULIN ASPART 100 UNIT/ML ~~LOC~~ SOLN
4.0000 [IU] | Freq: Three times a day (TID) | SUBCUTANEOUS | Status: DC
  Administered 2019-11-01 – 2019-11-04 (×9): 4 [IU] via SUBCUTANEOUS
  Filled 2019-11-01: qty 0.04

## 2019-11-01 MED ORDER — ACETAMINOPHEN 325 MG PO TABS
650.0000 mg | ORAL_TABLET | Freq: Four times a day (QID) | ORAL | Status: DC | PRN
  Administered 2019-11-07: 650 mg via ORAL
  Filled 2019-11-01 (×2): qty 2

## 2019-11-01 MED ORDER — GABAPENTIN 100 MG PO CAPS
100.0000 mg | ORAL_CAPSULE | Freq: Two times a day (BID) | ORAL | Status: DC
  Administered 2019-11-01 – 2019-11-08 (×14): 100 mg via ORAL
  Filled 2019-11-01 (×16): qty 1

## 2019-11-01 MED ORDER — LEVETIRACETAM 500 MG PO TABS
500.0000 mg | ORAL_TABLET | Freq: Two times a day (BID) | ORAL | Status: DC
  Administered 2019-11-01 – 2019-11-08 (×14): 500 mg via ORAL
  Filled 2019-11-01 (×17): qty 1

## 2019-11-01 MED ORDER — SODIUM CHLORIDE 0.9 % IV SOLN
100.0000 mg | Freq: Every day | INTRAVENOUS | Status: AC
  Administered 2019-11-02 – 2019-11-05 (×4): 100 mg via INTRAVENOUS
  Filled 2019-11-01 (×5): qty 20

## 2019-11-01 MED ORDER — HEPARIN SODIUM (PORCINE) 5000 UNIT/ML IJ SOLN
5000.0000 [IU] | Freq: Three times a day (TID) | INTRAMUSCULAR | Status: DC
  Administered 2019-11-01 – 2019-11-05 (×13): 5000 [IU] via SUBCUTANEOUS
  Filled 2019-11-01 (×13): qty 1

## 2019-11-01 MED ORDER — POLYETHYLENE GLYCOL 3350 17 G PO PACK: 17.0000 g | PACK | Freq: Every day | ORAL | Status: DC | PRN

## 2019-11-01 MED ORDER — PANTOPRAZOLE SODIUM 40 MG PO TBEC
40.0000 mg | DELAYED_RELEASE_TABLET | Freq: Every day | ORAL | Status: DC
  Administered 2019-11-01 – 2019-11-08 (×8): 40 mg via ORAL
  Filled 2019-11-01 (×9): qty 1

## 2019-11-01 MED ORDER — DEXAMETHASONE SODIUM PHOSPHATE 10 MG/ML IJ SOLN
6.0000 mg | Freq: Once | INTRAMUSCULAR | Status: AC
  Administered 2019-11-01: 6 mg via INTRAVENOUS
  Filled 2019-11-01: qty 1

## 2019-11-01 MED ORDER — FLUTICASONE FUROATE-VILANTEROL 100-25 MCG/INH IN AEPB
1.0000 | INHALATION_SPRAY | Freq: Every day | RESPIRATORY_TRACT | Status: DC
  Administered 2019-11-05 – 2019-11-08 (×4): 1 via RESPIRATORY_TRACT
  Filled 2019-11-01: qty 28

## 2019-11-01 MED ORDER — INSULIN ASPART 100 UNIT/ML ~~LOC~~ SOLN
0.0000 [IU] | Freq: Three times a day (TID) | SUBCUTANEOUS | Status: DC
  Administered 2019-11-01: 2 [IU] via SUBCUTANEOUS
  Administered 2019-11-02 (×3): 3 [IU] via SUBCUTANEOUS
  Administered 2019-11-03: 5 [IU] via SUBCUTANEOUS
  Administered 2019-11-03: 3 [IU] via SUBCUTANEOUS
  Administered 2019-11-03 – 2019-11-04 (×2): 5 [IU] via SUBCUTANEOUS
  Administered 2019-11-04: 15 [IU] via SUBCUTANEOUS
  Administered 2019-11-05 (×2): 5 [IU] via SUBCUTANEOUS
  Administered 2019-11-05: 2 [IU] via SUBCUTANEOUS
  Administered 2019-11-06: 11 [IU] via SUBCUTANEOUS
  Administered 2019-11-06 (×2): 5 [IU] via SUBCUTANEOUS
  Administered 2019-11-07: 2 [IU] via SUBCUTANEOUS
  Administered 2019-11-07: 15 [IU] via SUBCUTANEOUS
  Administered 2019-11-07: 8 [IU] via SUBCUTANEOUS
  Filled 2019-11-01: qty 0.15

## 2019-11-01 MED ORDER — DICLOFENAC SODIUM 1 % TD GEL
4.0000 g | Freq: Four times a day (QID) | TRANSDERMAL | Status: DC
  Filled 2019-11-01: qty 100

## 2019-11-01 MED ORDER — MONTELUKAST SODIUM 10 MG PO TABS
10.0000 mg | ORAL_TABLET | Freq: Every day | ORAL | Status: DC
  Administered 2019-11-02 – 2019-11-07 (×6): 10 mg via ORAL
  Filled 2019-11-01 (×6): qty 1

## 2019-11-01 MED ORDER — LINACLOTIDE 145 MCG PO CAPS
145.0000 ug | ORAL_CAPSULE | Freq: Every day | ORAL | Status: DC
  Administered 2019-11-02 – 2019-11-08 (×7): 145 ug via ORAL
  Filled 2019-11-01 (×7): qty 1

## 2019-11-01 MED ORDER — ISOSORBIDE MONONITRATE ER 30 MG PO TB24
30.0000 mg | ORAL_TABLET | Freq: Every day | ORAL | Status: DC
  Administered 2019-11-02 – 2019-11-08 (×7): 30 mg via ORAL
  Filled 2019-11-01 (×7): qty 1

## 2019-11-01 MED ORDER — ONDANSETRON HCL 4 MG/2ML IJ SOLN
4.0000 mg | Freq: Four times a day (QID) | INTRAMUSCULAR | Status: DC | PRN
  Filled 2019-11-01: qty 2

## 2019-11-01 MED ORDER — INSULIN GLARGINE 100 UNIT/ML ~~LOC~~ SOLN
10.0000 [IU] | Freq: Every day | SUBCUTANEOUS | Status: DC
  Administered 2019-11-02 – 2019-11-03 (×3): 10 [IU] via SUBCUTANEOUS
  Filled 2019-11-01 (×3): qty 0.1

## 2019-11-01 MED ORDER — ALLOPURINOL 100 MG PO TABS
100.0000 mg | ORAL_TABLET | Freq: Every day | ORAL | Status: DC
  Administered 2019-11-01 – 2019-11-08 (×8): 100 mg via ORAL
  Filled 2019-11-01 (×8): qty 1

## 2019-11-01 MED ORDER — ALBUTEROL SULFATE HFA 108 (90 BASE) MCG/ACT IN AERS
4.0000 | INHALATION_SPRAY | Freq: Once | RESPIRATORY_TRACT | Status: AC
  Administered 2019-11-01: 12:00:00 4 via RESPIRATORY_TRACT
  Filled 2019-11-01: qty 6.7

## 2019-11-01 MED ORDER — SODIUM CHLORIDE 0.9 % IV BOLUS: 1000.0000 mL | Freq: Once | INTRAVENOUS | Status: DC

## 2019-11-01 MED ORDER — ONDANSETRON HCL 4 MG PO TABS: 4.0000 mg | ORAL_TABLET | Freq: Four times a day (QID) | ORAL | Status: DC | PRN

## 2019-11-01 MED ORDER — DEXAMETHASONE SODIUM PHOSPHATE 10 MG/ML IJ SOLN
6.0000 mg | INTRAMUSCULAR | Status: DC
  Administered 2019-11-02 – 2019-11-04 (×3): 6 mg via INTRAVENOUS
  Filled 2019-11-01 (×3): qty 1

## 2019-11-01 NOTE — ED Triage Notes (Signed)
EMS reports from home, called out for generalized weakness x 1 week with decreased appetite. Pt denies pain or other issue.  BP 116/50 HR 75 RR 20 Sp02 93 RA CBG 83 Temp 98.3  20ga L forearm

## 2019-11-01 NOTE — ED Provider Notes (Signed)
[facility] DEPT Provider Note   CSN: [id number] Arrival date & time: 11/01/19  1032  LEVEL 5 CAVEAT - ALTERED MENTAL STATUS  History Chief Complaint  Patient presents with  . Fatigue    MILIYAH Roberts is a 79 y.o. female.  HPI 79 year old female presents with hypoxia and weakness.  Has had some mental status issues for a while but it seems to be a little worse over the last few days.  Recently exposed to Covid through the granddaughter.  Found to have a fever here and her coughing is worse than typical over the last couple days with more congestion.  Patient is altered compared to her baseline so most of the history is done via the daughter over the phone.   Past Medical History:  Diagnosis Date  . Acute delirium 01/14/2017  . Anxiety   . Arthritis    "all over"  . Asthma   . Bipolar disorder ([place])   . Complication of anesthesia    "w/right foot OR they gave me too much and they couldn't get me woke"  . Congestive heart failure, unspecified   . Coronary artery calcification seen on CAT scan 01/14/2017  . Coronary artery disease    "I've got 1 stent" (06/30/2015)  . Depression   . Discoid lupus   . Fibromyalgia    "I've been told I have this; can't take Lyrica cause I'm allergic to it" (06/30/2015)  . GERD (gastroesophageal reflux disease)   . History of gout   . History of hiatal hernia    "real bad" (06/30/2015)  . Hypertension   . Hypothyroidism   . Memory loss   . On home oxygen therapy    "2L q hs" (06/30/2015)  . OSA (obstructive sleep apnea)    "just use my oxygen; no mask" (06/30/2015)  . Other and unspecified hyperlipidemia   . Penetrating foot wound    left nonhealing foot wound on the dorsal surface  . Peripheral neuropathy   . Pneumonia "many of times"  . Refusal of blood transfusions as patient is Jehovah's Witness   . Renal failure, unspecified    "kidneys not working 100%" (06/30/2015)  . Seizure ([place])   . Sickle cell  trait ([place])   . Systemic lupus ([place])   . Thoracic aortic atherosclerosis ([place]) 01/14/2017  . Type II diabetes mellitus ([place])   . Unspecified vitamin D deficiency     Patient Active Problem List   Diagnosis Date Noted  . Pneumonia due to COVID-19 virus 11/01/2019  . Respiratory failure ([place]) 11/01/2019  . Memory loss 04/05/2019  . Cerebrovascular accident (CVA) ([place]) 04/05/2019  . Seizures ([place]) 04/05/2019  . Obstructive sleep apnea 04/05/2019  . Chronic heart failure with preserved ejection fraction ([place]) 01/17/2019  . BP (high blood pressure) 01/01/2019  . OP (osteoporosis) 01/01/2019  . Abdominal wall pain in right flank 08/13/2018  . Alopecia, scarring 08/11/2018  . Chronic right shoulder pain 03/15/2018  . Bilateral primary osteoarthritis of knee 02/09/2018  . Pain in right ankle and joints of right foot 02/09/2018  . Acute exacerbation of congestive heart failure ([place]) 12/13/2017  . AKI (acute kidney injury) ([place]) 09/01/2017  . Gout, arthropathy 09/01/2017  . Impacted cerumen of both ears 05/13/2017  . Sensorineural hearing loss, bilateral 05/13/2017  . Localization-related symptomatic epilepsy and epileptic syndromes with complex partial seizures, not intractable, without status epilepticus ([place]) 04/25/2017  . Fibromyalgia 04/08/2017  . Allergic rhinitis 03/11/2017  . Cardiomyopathy ([place]) 01/22/2017  .  Hyperlipidemia associated with type 2 diabetes mellitus ([place]) 01/22/2017  . Bipolar disorder ([place]) 01/22/2017  . Seizure ([place])   . Thoracic aortic atherosclerosis ([place]) 01/14/2017  . Coronary artery calcification seen on CAT scan 01/14/2017  . Acute delirium 01/14/2017  . Possible Tonic-clonic seizure disorder ([place]) 01/14/2017  . CVA (cerebral vascular accident) ([place]) 01/14/2017  . Altered mental status 01/13/2017  . Mastalgia 04/20/2016  . Bilateral leg edema 02/10/2016  . Difficulty hearing 10/30/2015  . Midline low back pain with right-sided sciatica 09/25/2015  .  Carpal tunnel syndrome on left 08/14/2015  . Chest pain of uncertain etiology 39/09/90  . Primary localized osteoarthrosis, hand 06/19/2015  . Radial styloid tenosynovitis 06/19/2015  . Dysarthria 02/14/2015  . Schizophrenia, unspecified type ([place]) 10/25/2014  . Type 2 diabetes mellitus with complication ([place])   . Cephalalgia   . Hx of systemic lupus erythematosus (SLE) ([place])   . Thyroid mass 04/26/2014  . Constipation 12/10/2013  . Benign neoplasm of colon 12/10/2013  . Controlled type 2 diabetes with neuropathy ([place]) 11/27/2013  . Cystic kidney disease 11/09/2013  . Insulin dependent type 2 diabetes mellitus ([place]) 10/29/2013  . Hoarseness or changing voice 09/27/2013  . Dyskinesia, tardive 07/11/2013  . TMJ syndrome 02/16/2013  . Angina pectoris associated with type 2 diabetes mellitus ([place]) 08/08/2012  . Coronary artery disease of native artery of native heart with stable angina pectoris ([place]) 08/08/2012  . Asthma, chronic, PRESUMED wiht MULTIFACTORIAL DYSPNEA 05/30/2012  . Insomnia 09/22/2011  . Mobility poor 02/02/2011  . Chronic kidney disease, stage III (moderate) 05/13/2010  . Chronic combined systolic and diastolic CHF, NYHA class 2 ([place]) 03/31/2010  . COPD (chronic obstructive pulmonary disease) ([place]) 03/26/2010  . Morbid obesity ([place]) 02/27/2010  . Unstable gait 02/27/2010  . DM (diabetes mellitus), type 2 with neurological complications ([place]) 33/00/7622  . Arthritis 04/25/2009  . Anemia 03/11/2007  . COGNITIVE IMPAIRMENT, MILD, SO STATED 03/11/2007  . RESTLESS LEG SYNDROME 03/11/2007  . Lupus ([place]) 03/11/2007  . OSTEOARTHROSIS, GENERALIZED, MULTIPLE SITES 03/11/2007  . Hypothyroidism 03/07/2007  . Hypercholesteremia 03/07/2007  . Depression 03/07/2007  . Hypertensive heart disease with heart failure ([place]) 03/07/2007  . Coronary atherosclerosis 03/07/2007  . GERD 03/07/2007  . SLEEP APNEA 03/07/2007    Past Surgical History:  Procedure Laterality Date  .  ANKLE ARTHROSCOPY WITH OPEN REDUCTION INTERNAL FIXATION (ORIF) Right 03/2011  . CARDIAC CATHETERIZATION N/A 07/01/2015   Procedure: Left Heart Cath and Coronary Angiography;  Surgeon: Adrian Prows, MD;  Location: [facility];  Service: Cardiovascular;  Laterality: N/A;  . CARDIAC CATHETERIZATION N/A 07/01/2015   Procedure: Intravascular Pressure Wire/FFR Study;  Surgeon: Adrian Prows, MD;  Location: [facility];  Service: Cardiovascular;  Laterality: N/A;  . CARPAL TUNNEL RELEASE Bilateral   . CATARACT EXTRACTION W/ INTRAOCULAR LENS  IMPLANT, BILATERAL Bilateral   . CHOLECYSTECTOMY OPEN    . COLONOSCOPY N/A 12/10/2013   Procedure: COLONOSCOPY;  Surgeon: Inda Castle, MD;  Location: [facility];  Service: Endoscopy;  Laterality: N/A;  . FRACTURE SURGERY    . INCISION AND DRAINAGE ABSCESS Left    foot "under my little toe"  . KNEE LIGAMENT RECONSTRUCTION Right   . LEFT HEART CATHETERIZATION WITH CORONARY ANGIOGRAM N/A 08/08/2012   Procedure: LEFT HEART CATHETERIZATION WITH CORONARY ANGIOGRAM;  Surgeon: [name], MD;  Location: [facility] CATH LAB;  Service: Cardiovascular;  Laterality: N/A;  . NASAL SINUS SURGERY    . PERCUTANEOUS CORONARY STENT INTERVENTION (PCI-S) N/A 08/21/2012  Procedure: PERCUTANEOUS CORONARY STENT INTERVENTION (PCI-S);  Surgeon: [name], MD;  Location: [facility] CATH LAB;  Service: Cardiovascular;  Laterality: N/A;  . TUBAL LIGATION       OB History   No obstetric history on file.     Family History  Problem Relation Age of Onset  . Heart disease Mother   . Diabetes Mother   . Clotting disorder Mother   . Pneumonia Father   . Rheum arthritis Father   . Diabetes Sister   . Diabetes Sister   . Asthma Brother   . Cancer Brother   . Kidney disease Brother   . Lupus Son   . Heart disease Son   . Heart disease Daughter   . Colon cancer Neg Hx     Social History   Tobacco Use  . Smoking status: Former Smoker    Packs/day: 3.00    Years: 40.00     Pack years: 120.00    Types: Cigarettes    Start date: 07/05/1960    Quit date: 07/05/1998    Years since quitting: 21.3  . Smokeless tobacco: Never Used  Substance Use Topics  . Alcohol use: No    Alcohol/week: 0.0 standard drinks    Comment: havent drank in over 40 years  . Drug use: No    Home Medications Prior to Admission medications   Medication Sig Start Date End Date Taking? Authorizing Provider  Accu-Chek Softclix Lancets lancets Use as instructed 03/15/19   [name], [name], MD  acetaminophen (TYLENOL) 325 MG tablet Take 650 mg by mouth every 6 (six) hours as needed for mild pain or moderate pain. 01/24/17   [provider]  allopurinol (ZYLOPRIM) 100 MG tablet Take 1 tablet (100 mg total) by mouth daily. 10/17/19   [name], [name], MD  amLODipine (NORVASC) 5 MG tablet Take 1 tablet (5 mg total) by mouth daily. 10/17/19   [name], [name], MD  Ascorbic Acid (VITAMIN C PO) Take 1 tablet by mouth daily.    [provider]  aspirin EC 81 MG tablet Take 81 mg by mouth daily.     [provider]  atorvastatin (LIPITOR) 20 MG tablet Take 1 tablet (20 mg total) by mouth daily. 03/27/19   [name], [name], MD  B Complex Vitamins (VITAMIN B COMPLEX) TABS Take 1 tablet by mouth daily.    [provider]  budesonide-formoterol (SYMBICORT) 80-4.5 MCG/ACT inhaler Inhale 2 puffs into the lungs 2 (two) times daily. 07/17/19   [name], [name], MD  Cholecalciferol (VITAMIN D3) 5000 UNITS CAPS Take 5,000 Units by mouth daily.    [provider]  diclofenac sodium (VOLTAREN) 1 % GEL Apply 4 g topically 4 (four) times daily. 05/14/19   [name], [name], MD  DULoxetine (CYMBALTA) 60 MG capsule Take 1 capsule (60 mg total) by mouth daily. 10/20/19   [name], [name], MD  ferrous sulfate 325 (65 FE) MG tablet Take 325 mg by mouth daily with breakfast.    [provider]  furosemide (LASIX) 40 MG tablet Take 40 mg by mouth daily.     [provider]    gabapentin (NEURONTIN) 300 MG capsule Take 1 capsule (300 mg total) by mouth 2 (two) times daily. 07/24/19   [name], [name], MD  glucose blood (ACCU-CHEK AVIVA PLUS) test strip Use to test blood sugar three times daily 03/27/19   [name], [name], MD  HUMULIN N KWIKPEN 100 UNIT/ML Kiwkpen ADMINISTER 30 UNITS UNDER  THE SKIN EVERY MORNING 12/22/18   [name], [name], MD  Insulin Pen Needle (B-D ULTRAFINE III SHORT PEN) 31G X 8 MM MISC CHECK BLOOD SUGAR 3 TIMES A DAY 03/15/19   [name], [name], MD  isosorbide mononitrate (IMDUR) 30 MG 24 hr tablet Take 1 tablet (30 mg total) by mouth daily. 01/29/19 04/29/19  Patwardhan, Reynold Bowen, MD  levETIRAcetam (KEPPRA) 500 MG tablet Take 1 tablet (500 mg total) by mouth 2 (two) times daily. 07/30/19   Suzzanne Cloud, NP  linaclotide [facility]) 145 MCG CAPS capsule Take 1 capsule (145 mcg total) by mouth daily before breakfast. 10/17/19   [name], [name], MD  memantine (NAMENDA) 10 MG tablet Take 1 tablet (10 mg total) by mouth 2 (two) times daily. 04/05/19   Marcial Pacas, MD  metoprolol succinate (TOPROL-XL) 50 MG 24 hr tablet Take 1 tablet (50 mg total) by mouth daily. Take with or immediately following a meal. 06/11/19   [name], [name], MD  montelukast (SINGULAIR) 10 MG tablet Take 1 tablet (10 mg total) by mouth at bedtime. 03/14/19   [name], [name], MD  Multiple Vitamin (MULTIVITAMIN WITH MINERALS) TABS tablet Take 1 tablet by mouth daily.    [provider]  nitroGLYCERIN (NITROSTAT) 0.4 MG SL tablet Place 1 tablet (0.4 mg total) under the tongue every 5 (five) minutes as needed for chest pain. 01/29/19 10/15/19  Patwardhan, Reynold Bowen, MD  pantoprazole (PROTONIX) 40 MG tablet TAKE 1 TABLET(40 MG) BY MOUTH DAILY 08/28/19   [name], [name], MD  potassium chloride (KLOR-CON) 10 MEQ tablet TAKE 1 TABLET BY MOUTH 2 TIMES A DAY WITH LASIX 07/09/19   Adrian Prows, MD  PROAIR HFA 108 [phone] Base) MCG/ACT inhaler INHALE 2 PUFF BY MOUTH EVERY 4 HOURS AS NEEDED USE ONLY IF YOU ARE  WHEEZING 10/17/19   [name], [name], MD  Spacer/Aero-Holding Chambers (AEROCHAMBER PLUS) inhaler Use as instructed to use with inahaler. 07/17/19   [name], [name], MD    Allergies    Ace inhibitors, Isosorb dinitrate-hydralazine, Penicillins, Buspirone, Pregabalin, Ropinirole hydrochloride, and Amantadines  Review of Systems   Review of Systems  Unable to perform ROS: Mental status change    Physical Exam Updated Vital Signs BP (!) 122/59   Pulse 62   Temp 98.4 F (36.9 C) (Oral)   Resp 12   Ht 5\' 3"  (1.6 m)   Wt 130.6 kg   SpO2 98%   BMI 51.02 kg/m   Physical Exam Vitals and nursing note reviewed.  Constitutional:      Appearance: She is well-developed. She is obese.  HENT:     Head: Normocephalic and atraumatic.     Right Ear: External ear normal.     Left Ear: External ear normal.     Nose: Nose normal.  Eyes:     General:        Right eye: No discharge.        Left eye: No discharge.  Cardiovascular:     Rate and Rhythm: Normal rate and regular rhythm.     Heart sounds: Normal heart sounds.  Pulmonary:     Effort: Pulmonary effort is normal. No tachypnea, accessory muscle usage or respiratory distress.     Breath sounds: Examination of the right-lower field reveals rales. Examination of the left-lower field reveals rales. Wheezing and rales (mild) present.  Abdominal:     Palpations: Abdomen is soft.     Tenderness: There is no abdominal tenderness.  Skin:  General: Skin is warm and dry.  Neurological:     Mental Status: She is alert. She is disoriented.  Psychiatric:        Mood and Affect: Mood is not anxious.     ED Results / Procedures / Treatments   Labs (all labs ordered are listed, but only abnormal results are displayed) Labs Reviewed  COMPREHENSIVE METABOLIC PANEL - Abnormal; Notable for the following components:      Result Value   BUN 51 (*)    Creatinine, Ser 2.27 (*)    Calcium 8.5 (*)    Albumin 3.2 (*)    AST 52 (*)    GFR calc  non Af Amer 20 (*)    GFR calc Af Amer 23 (*)    All other components within normal limits  CBC WITH DIFFERENTIAL/PLATELET - Abnormal; Notable for the following components:   MCV 75.3 (*)    MCH 25.8 (*)    Platelets 108 (*)    All other components within normal limits  URINALYSIS, ROUTINE W REFLEX MICROSCOPIC - Abnormal; Notable for the following components:   APPearance HAZY (*)    Protein, ur 100 (*)    All other components within normal limits  BRAIN NATRIURETIC PEPTIDE - Abnormal; Notable for the following components:   B Natriuretic Peptide 111.4 (*)    All other components within normal limits  C-REACTIVE PROTEIN - Abnormal; Notable for the following components:   CRP 5.6 (*)    All other components within normal limits  FERRITIN - Abnormal; Notable for the following components:   Ferritin 941 (*)    All other components within normal limits  D-DIMER, QUANTITATIVE (NOT AT [facility]) - Abnormal; Notable for the following components:   D-Dimer, Quant 6.60 (*)    All other components within normal limits  POC SARS CORONAVIRUS 2 AG -  ED - Abnormal; Notable for the following components:   SARS Coronavirus 2 Ag POSITIVE (*)    All other components within normal limits  CULTURE, BLOOD (ROUTINE X 2)  CULTURE, BLOOD (ROUTINE X 2)  URINE CULTURE  LACTIC ACID, PLASMA  APTT  PROTIME-INR  BRAIN NATRIURETIC PEPTIDE  TSH  CBC WITH DIFFERENTIAL/PLATELET  COMPREHENSIVE METABOLIC PANEL  C-REACTIVE PROTEIN  FERRITIN  D-DIMER, QUANTITATIVE (NOT AT [facility])  MAGNESIUM    EKG EKG Interpretation  Date/Time:  Thursday November 01 2019 10:59:08 EDT Ventricular Rate:  69 PR Interval:    QRS Duration: 87 QT Interval:  392 QTC Calculation: 435 R Axis:   -17 Text Interpretation: Sinus or ectopic atrial rhythm Atrial premature complexes Prolonged PR interval Low voltage, extremity and precordial leads Consider anterior infarct Nonspecific T abnormalities, lateral leads similar to 2019 Confirmed  by Sherwood Gambler [phone]) on 11/01/2019 11:21:28 AM   Radiology CT Head Wo Contrast  Result Date: 11/01/2019 CLINICAL DATA:  Generalized weakness, altered mental status EXAM: CT HEAD WITHOUT CONTRAST TECHNIQUE: Contiguous axial images were obtained from the base of the skull through the vertex without intravenous contrast. COMPARISON:  01/13/2017 FINDINGS: Brain: No evidence of acute infarction, hemorrhage, hydrocephalus, extra-axial collection or mass lesion/mass effect. Stable right frontal encephalomalacia. Scattered low-density changes within the periventricular and subcortical white matter compatible with chronic microvascular ischemic change. Mild diffuse cerebral volume loss. Vascular: Atherosclerotic calcifications involving the large vessels of the skull base. No unexpected hyperdense vessel. Skull: Normal. Negative for fracture or focal lesion. Sinuses/Orbits: Stable postsurgical changes to the right maxillary sinus. Remaining paranasal sinuses and mastoid air cells are clear.  Orbital structures unremarkable. Other: None. IMPRESSION: 1. No acute intracranial findings. 2. Stable right frontal encephalomalacia and chronic microvascular ischemic changes of the white matter. Electronically Signed   By: Davina Poke D.O.   On: 11/01/2019 13:47   DG Chest Port 1 View  Result Date: 11/01/2019 CLINICAL DATA:  Fever hypoxia EXAM: PORTABLE CHEST 1 VIEW COMPARISON:  12/13/2017 FINDINGS: Cardiomediastinal contours remain enlarged with central pulmonary vascular engorgement. Mild pulmonary edema likely present as well with subtle septal lines at the periphery. RIGHT hemidiaphragm remains slightly elevated. Node dense consolidation. Improved aeration at the RIGHT lung base compared to the previous exam. Visualized skeletal structures are unremarkable. IMPRESSION: 1. Findings suggestive of mild pulmonary edema. 2. Improved aeration at the RIGHT lung base compared to the previous exam likely with atelectasis  on today's study. 3. Stable enlargement of the cardiomediastinal contours. Electronically Signed   By: Zetta Bills M.D.   On: 11/01/2019 11:51    Procedures Procedures (including critical care time)  Medications Ordered in ED Medications  remdesivir 100 mg in sodium chloride 0.9 % 100 mL IVPB (0 mg Intravenous Stopped 11/01/19 1548)    Followed by  remdesivir 100 mg in sodium chloride 0.9 % 100 mL IVPB (has no administration in time range)  acetaminophen (TYLENOL) tablet 650 mg (has no administration in time range)  allopurinol (ZYLOPRIM) tablet 100 mg (has no administration in time range)  aspirin EC tablet 81 mg (has no administration in time range)  amLODipine (NORVASC) tablet 5 mg (has no administration in time range)  atorvastatin (LIPITOR) tablet 20 mg (has no administration in time range)  isosorbide mononitrate (IMDUR) 24 hr tablet 30 mg (has no administration in time range)  metoprolol succinate (TOPROL-XL) 24 hr tablet 50 mg (has no administration in time range)  DULoxetine (CYMBALTA) DR capsule 60 mg (has no administration in time range)  memantine (NAMENDA) tablet 10 mg (has no administration in time range)  linaclotide (LINZESS) capsule 145 mcg (has no administration in time range)  pantoprazole (PROTONIX) EC tablet 40 mg (has no administration in time range)  gabapentin (NEURONTIN) capsule 100 mg (has no administration in time range)  levETIRAcetam (KEPPRA) tablet 500 mg (has no administration in time range)  fluticasone furoate-vilanterol (BREO ELLIPTA) 100-25 MCG/INH 1 puff (has no administration in time range)  montelukast (SINGULAIR) tablet 10 mg (has no administration in time range)  albuterol (VENTOLIN HFA) 108 (90 Base) MCG/ACT inhaler 2 puff (has no administration in time range)  diclofenac sodium (VOLTAREN) 1 % transdermal gel 4 g (has no administration in time range)  polyethylene glycol (MIRALAX / GLYCOLAX) packet 17 g (has no administration in time range)    ondansetron (ZOFRAN) tablet 4 mg (has no administration in time range)    Or  ondansetron (ZOFRAN) injection 4 mg (has no administration in time range)  guaiFENesin (MUCINEX) 12 hr tablet 600 mg (has no administration in time range)  heparin injection 5,000 Units (has no administration in time range)  insulin glargine (LANTUS) injection 10 Units (has no administration in time range)  dexamethasone (DECADRON) injection 6 mg (has no administration in time range)  insulin aspart (novoLOG) injection 0-15 Units (has no administration in time range)  insulin aspart (novoLOG) injection 0-5 Units (has no administration in time range)  insulin aspart (novoLOG) injection 4 Units (has no administration in time range)  acetaminophen (TYLENOL) tablet 650 mg (650 mg Oral Given 11/01/19 1113)  levofloxacin (LEVAQUIN) IVPB 750 mg (0 mg Intravenous Stopped 11/01/19 1425)  albuterol (VENTOLIN HFA) 108 (90 Base) MCG/ACT inhaler 4 puff (4 puffs Inhalation Given 11/01/19 1205)  dexamethasone (DECADRON) injection 6 mg (6 mg Intravenous Given 11/01/19 1343)    ED Course  I have reviewed the triage vital signs and the nursing notes.  Pertinent labs & imaging results that were available during my care of the patient were reviewed by me and considered in my medical decision making (see chart for details).    MDM Rules/Calculators/A&P                      Patient is found to be covid positive. Initially, due to hypoxia, cough, and fever, was started on sepsis protocol and given levaquin (PCN allergy). However it's much more likely this is only a viral process from Covid. No respiratory distress. On 2L O2. Labs, CT head and CXR all personally reviewed and incorporated in management. Start on decadron and remdesivir. Discussed results and plan with daughter over the phone. Admit to hospitalist service.   KEIONDRA BROOKOVER was evaluated in Emergency Department on 11/01/2019 for the symptoms described in the history of present  illness. She was evaluated in the context of the global COVID-19 pandemic, which necessitated consideration that the patient might be at risk for infection with the SARS-CoV-2 virus that causes COVID-19. Institutional protocols and algorithms that pertain to the evaluation of patients at risk for COVID-19 are in a state of rapid change based on information released by regulatory bodies including the CDC and federal and state organizations. These policies and algorithms were followed during the patient's care in the ED.  Final Clinical Impression(s) / ED Diagnoses Final diagnoses:  COVID-19 virus infection  Acute respiratory failure with hypoxia [facility])    Rx / DC Orders ED Discharge Orders    None       Sherwood Gambler, MD 11/01/19 1723

## 2019-11-01 NOTE — Progress Notes (Signed)
A consult was received from an ED physician for levaquin per pharmacy dosing.  The patient's profile has been reviewed for ht/wt/allergies/indication/available labs.   A one time order has been placed for levaquin 750mg  x 1.  Further antibiotics/pharmacy consults should be ordered by admitting physician if indicated.                       Thank you, Kara Mead 11/01/2019  1:08 PM

## 2019-11-01 NOTE — H&P (Addendum)
[facility] History and Physical  Brittany Roberts DQQ:[id number] DOB: 1941-05-09 DOA: 11/01/2019  Referring physician: ED  PCP: [name], [name], MD   Patient is coming from: Home  Chief Complaint: Shortness of breath, weakness  HPI: Brittany Roberts is a 79 y.o. female with past medical history of bipolar disorder, congestive heart failure, coronary artery disease, depression, fibromyalgia, dementia, GERD,  hypothyroidism, hypertension, diabetes mellitus, chronic kidney disease presented to the hospital with complaints of fatigue, weakness and shortness of breath.  Patient does have underlying dementia but has been getting little worse over the last few days.  Patient denies any chest pain, palpitation or productive cough.  Patient states that her granddaughter had recently visited after which she started having some dry cough throat irritation and dyspnea which is getting worse over the last few days.  She complains mostly of congestion in her chest.  Patient denies any dizziness, nausea, vomiting.  Denies diarrhea headache, urinary urgency frequency or dysuria.  She complains of a decreased appetite.  ED Course: In the ED, patient noted to be hypoxic and required 2 L of nasal cannula oxygen.  BUN was 40 with a creatinine of 2.2.  Urine culture blood cultures were sent to the ED.  Chest x-ray showed some congestive findings.  COVID 19-Was positive.  Patient was then considered for admission to the hospital.  Review of Systems:  All systems were reviewed and were negative unless otherwise mentioned in the HPI  Past Medical History:  Diagnosis Date  . Acute delirium 01/14/2017  . Anxiety   . Arthritis    "all over"  . Asthma   . Bipolar disorder ([place])   . Complication of anesthesia    "w/right foot OR they gave me too much and they couldn't get me woke"  . Congestive heart failure, unspecified   . Coronary artery calcification seen on CAT scan 01/14/2017  . Coronary artery disease    "I've  got 1 stent" (06/30/2015)  . Depression   . Discoid lupus   . Fibromyalgia    "I've been told I have this; can't take Lyrica cause I'm allergic to it" (06/30/2015)  . GERD (gastroesophageal reflux disease)   . History of gout   . History of hiatal hernia    "real bad" (06/30/2015)  . Hypertension   . Hypothyroidism   . Memory loss   . On home oxygen therapy    "2L q hs" (06/30/2015)  . OSA (obstructive sleep apnea)    "just use my oxygen; no mask" (06/30/2015)  . Other and unspecified hyperlipidemia   . Penetrating foot wound    left nonhealing foot wound on the dorsal surface  . Peripheral neuropathy   . Pneumonia "many of times"  . Refusal of blood transfusions as patient is Jehovah's Witness   . Renal failure, unspecified    "kidneys not working 100%" (06/30/2015)  . Seizure ([place])   . Sickle cell trait ([place])   . Systemic lupus ([place])   . Thoracic aortic atherosclerosis ([place]) 01/14/2017  . Type II diabetes mellitus ([place])   . Unspecified vitamin D deficiency    Past Surgical History:  Procedure Laterality Date  . ANKLE ARTHROSCOPY WITH OPEN REDUCTION INTERNAL FIXATION (ORIF) Right 03/2011  . CARDIAC CATHETERIZATION N/A 07/01/2015   Procedure: Left Heart Cath and Coronary Angiography;  Surgeon: Adrian Prows, MD;  Location: [place];  Service: Cardiovascular;  Laterality: N/A;  . CARDIAC CATHETERIZATION N/A 07/01/2015   Procedure: Intravascular Pressure Wire/FFR  Study;  Surgeon: Adrian Prows, MD;  Location: [place];  Service: Cardiovascular;  Laterality: N/A;  . CARPAL TUNNEL RELEASE Bilateral   . CATARACT EXTRACTION W/ INTRAOCULAR LENS  IMPLANT, BILATERAL Bilateral   . CHOLECYSTECTOMY OPEN    . COLONOSCOPY N/A 12/10/2013   Procedure: COLONOSCOPY;  Surgeon: Inda Castle, MD;  Location: [facility];  Service: Endoscopy;  Laterality: N/A;  . FRACTURE SURGERY    . INCISION AND DRAINAGE ABSCESS Left    foot "under my little toe"  . KNEE LIGAMENT RECONSTRUCTION  Right   . LEFT HEART CATHETERIZATION WITH CORONARY ANGIOGRAM N/A 08/08/2012   Procedure: LEFT HEART CATHETERIZATION WITH CORONARY ANGIOGRAM;  Surgeon: [name], MD;  Location: [facility];  Service: Cardiovascular;  Laterality: N/A;  . NASAL SINUS SURGERY    . PERCUTANEOUS CORONARY STENT INTERVENTION (PCI-S) N/A 08/21/2012   Procedure: PERCUTANEOUS CORONARY STENT INTERVENTION (PCI-S);  Surgeon: [name], MD;  Location: [facility] LAB;  Service: Cardiovascular;  Laterality: N/A;  . TUBAL LIGATION      Social History:  reports that she quit smoking about 21 years ago. Her smoking use included cigarettes. She started smoking about 59 years ago. She has a 120.00 pack-year smoking history. She has never used smokeless tobacco. She reports that she does not drink alcohol or use drugs.  Allergies  Allergen Reactions  . Ace Inhibitors Other (See Comments)    Coincided with sig bump in creat. Retried and creat bumped again. Coincided with sig bump in creat. Retried and creat bumped again. Coincided with sig bump in creat. Retried and creat bumped again. Other reaction(s): Other (See Comments) Coincided with sig bump in creat. Retried and creat bumped again. Coincided with sig bump in creat. Retried and creat bumped again.  . Isosorb Dinitrate-Hydralazine Other (See Comments)    Sleep all the time. Sleep all the time. Other reaction(s): Other (See Comments) Sleep all the time.  Marland Kitchen Penicillins Anaphylaxis, Swelling and Rash    Has patient had a PCN reaction causing immediate rash, facial/tongue/throat swelling, SOB or lightheadedness with hypotension: Yes Has patient had a PCN reaction causing severe rash involving mucus membranes or skin necrosis: Yes Has patient had a PCN reaction that required hospitalization: Yes Has patient had a PCN reaction occurring within the last 10 years: No If all of the above answers are "NO", then may proceed with Cephalosporin use.   Marland Kitchen Buspirone Other  (See Comments)    pain pain pain Other reaction(s): Other (See Comments) pain pain  . Pregabalin Swelling  . Ropinirole Hydrochloride Swelling  . Amantadines Rash    "Swelling of the tongue"    Family History  Problem Relation Age of Onset  . Heart disease Mother   . Diabetes Mother   . Clotting disorder Mother   . Pneumonia Father   . Rheum arthritis Father   . Diabetes Sister   . Diabetes Sister   . Asthma Brother   . Cancer Brother   . Kidney disease Brother   . Lupus Son   . Heart disease Son   . Heart disease Daughter   . Colon cancer Neg Hx      Prior to Admission medications   Medication Sig Start Date End Date Taking? Authorizing Provider  Accu-Chek Softclix Lancets lancets Use as instructed 03/15/19   [name], [name], MD  acetaminophen (TYLENOL) 325 MG tablet Take 650 mg by mouth every 6 (six) hours as needed for mild pain or moderate pain.  01/24/17   [provider]  allopurinol (ZYLOPRIM) 100 MG tablet Take 1 tablet (100 mg total) by mouth daily. 10/17/19   [name], [name], MD  amLODipine (NORVASC) 5 MG tablet Take 1 tablet (5 mg total) by mouth daily. 10/17/19   [name], [name], MD  Ascorbic Acid (VITAMIN C PO) Take 1 tablet by mouth daily.    [provider]  aspirin EC 81 MG tablet Take 81 mg by mouth daily.     [provider]  atorvastatin (LIPITOR) 20 MG tablet Take 1 tablet (20 mg total) by mouth daily. 03/27/19   [name], [name], MD  B Complex Vitamins (VITAMIN B COMPLEX) TABS Take 1 tablet by mouth daily.    [provider]  budesonide-formoterol (SYMBICORT) 80-4.5 MCG/ACT inhaler Inhale 2 puffs into the lungs 2 (two) times daily. 07/17/19   [name], [name], MD  Cholecalciferol (VITAMIN D3) 5000 UNITS CAPS Take 5,000 Units by mouth daily.    [provider]  diclofenac sodium (VOLTAREN) 1 % GEL Apply 4 g topically 4 (four) times daily. 05/14/19   [name], [name], MD  DULoxetine (CYMBALTA) 60 MG capsule Take 1  capsule (60 mg total) by mouth daily. 10/20/19   [name], [name], MD  ferrous sulfate 325 (65 FE) MG tablet Take 325 mg by mouth daily with breakfast.    [provider]  furosemide (LASIX) 40 MG tablet Take 40 mg by mouth daily.     [provider]  gabapentin (NEURONTIN) 300 MG capsule Take 1 capsule (300 mg total) by mouth 2 (two) times daily. 07/24/19   [name], [name], MD  glucose blood (ACCU-CHEK AVIVA PLUS) test strip Use to test blood sugar three times daily 03/27/19   [name], [name], MD  HUMULIN N KWIKPEN 100 UNIT/ML Kiwkpen ADMINISTER 30 UNITS UNDER THE SKIN EVERY MORNING 12/22/18   [name], [name], MD  Insulin Pen Needle (B-D ULTRAFINE III SHORT PEN) 31G X 8 MM MISC CHECK BLOOD SUGAR 3 TIMES A DAY 03/15/19   [name], [name], MD  isosorbide mononitrate (IMDUR) 30 MG 24 hr tablet Take 1 tablet (30 mg total) by mouth daily. 01/29/19 04/29/19  Patwardhan, Reynold Bowen, MD  levETIRAcetam (KEPPRA) 500 MG tablet Take 1 tablet (500 mg total) by mouth 2 (two) times daily. 07/30/19   Suzzanne Cloud, NP  linaclotide [facility]) 145 MCG CAPS capsule Take 1 capsule (145 mcg total) by mouth daily before breakfast. 10/17/19   [name], [name], MD  memantine (NAMENDA) 10 MG tablet Take 1 tablet (10 mg total) by mouth 2 (two) times daily. 04/05/19   Marcial Pacas, MD  metoprolol succinate (TOPROL-XL) 50 MG 24 hr tablet Take 1 tablet (50 mg total) by mouth daily. Take with or immediately following a meal. 06/11/19   [name], [name], MD  montelukast (SINGULAIR) 10 MG tablet Take 1 tablet (10 mg total) by mouth at bedtime. 03/14/19   [name], [name], MD  Multiple Vitamin (MULTIVITAMIN WITH MINERALS) TABS tablet Take 1 tablet by mouth daily.    [provider]  nitroGLYCERIN (NITROSTAT) 0.4 MG SL tablet Place 1 tablet (0.4 mg total) under the tongue every 5 (five) minutes as needed for chest pain. 01/29/19 10/15/19  Patwardhan, Reynold Bowen, MD  pantoprazole (PROTONIX) 40 MG tablet TAKE 1 TABLET(40 MG) BY MOUTH  DAILY 08/28/19   [name], [name], MD  potassium chloride (KLOR-CON) 10 MEQ tablet TAKE 1 TABLET BY MOUTH 2 TIMES A DAY WITH LASIX  07/09/19   Adrian Prows, MD  PROAIR HFA 108 [phone] Base) MCG/ACT inhaler INHALE 2 PUFF BY MOUTH EVERY 4 HOURS AS NEEDED USE ONLY IF YOU ARE WHEEZING 10/17/19   [name], [name], MD  Spacer/Aero-Holding Chambers (AEROCHAMBER PLUS) inhaler Use as instructed to use with inahaler. 07/17/19   [name], [name], MD    Physical Exam: Vitals:   11/01/19 1100 11/01/19 1208 11/01/19 1345 11/01/19 1429  BP:  (!) 140/53 125/72   Pulse: 66 75 68   Resp: 18 20 15    Temp:    98.4 F (36.9 C)  TempSrc:    Oral  SpO2: 97% 93% 92%   Weight: 130.6 kg     Height: 5\' 3"  (1.6 m)      Wt Readings from Last 3 Encounters:  11/01/19 130.6 kg  10/17/19 130.6 kg  10/15/19 130.6 kg   Body mass index is 51.02 kg/m.  General: Alert awake and communicative, not in obvious distress, on nasal cannula oxygen at 2 L/min, morbidly obese HENT: Normocephalic, pupils equally reacting to light and accommodation.  No scleral pallor or icterus noted. Oral mucosa is moist.  Chest: Mild rhonchi noted.  Diminished breath sounds bilaterally. CVS: S1 &S2 heard. No murmur.  Regular rate and rhythm. Abdomen: Soft, nontender, nondistended.  Bowel sounds are heard.  Extremities: No cyanosis, clubbing or edema.  Peripheral pulses are palpable. Psych: Alert, awake and communicative, underlying dementia.  Disoriented CNS:  No cranial nerve deficits.  Moves all extremities.    Skin: Warm and dry.  No rashes noted.  Labs on Admission:   CBC: Recent Labs  Lab 11/01/19 1120  WBC 6.9  NEUTROABS 4.1  HGB 12.6  HCT 36.8  MCV 75.3*  PLT 108*    Basic Metabolic Panel: Recent Labs  Lab 11/01/19 1120  NA 137  K 4.9  CL 107  CO2 22  GLUCOSE 95  BUN 51*  CREATININE 2.27*  CALCIUM 8.5*    Liver Function Tests: Recent Labs  Lab 11/01/19 1120  AST 52*  ALT 29  ALKPHOS 79  BILITOT 0.5  PROT 7.9    ALBUMIN 3.2*   No results for input(s): LIPASE, AMYLASE in the last 168 hours. No results for input(s): AMMONIA in the last 168 hours.  Cardiac Enzymes: No results for input(s): CKTOTAL, CKMB, CKMBINDEX, TROPONINI in the last 168 hours.  BNP (last 3 results) Recent Labs    11/01/19 1120  BNP 111.4*    ProBNP (last 3 results) No results for input(s): PROBNP in the last 8760 hours.  CBG: No results for input(s): GLUCAP in the last 168 hours.  Lipase  No results found for: LIPASE   Urinalysis    Component Value Date/Time   COLORURINE YELLOW 11/01/2019 1218   APPEARANCEUR HAZY (A) 11/01/2019 1218   LABSPEC 1.013 11/01/2019 1218   PHURINE 5.0 11/01/2019 [place] 11/01/2019 1218   HGBUR NEGATIVE 11/01/2019 1218   HGBUR negative 02/26/2009 1314   BILIRUBINUR NEGATIVE 11/01/2019 1218   BILIRUBINUR NEG 04/05/2014 1455   KETONESUR NEGATIVE 11/01/2019 1218   PROTEINUR 100 (A) 11/01/2019 1218   UROBILINOGEN 0.2 08/01/2014 2305   NITRITE NEGATIVE 11/01/2019 1218   LEUKOCYTESUR NEGATIVE 11/01/2019 1218     Radiological Exams on Admission: CT Head Wo Contrast  Result Date: 11/01/2019 CLINICAL DATA:  Generalized weakness, altered mental status EXAM: CT HEAD WITHOUT CONTRAST TECHNIQUE: Contiguous axial images were obtained from the base of the skull through the vertex without intravenous contrast.  COMPARISON:  01/13/2017 FINDINGS: Brain: No evidence of acute infarction, hemorrhage, hydrocephalus, extra-axial collection or mass lesion/mass effect. Stable right frontal encephalomalacia. Scattered low-density changes within the periventricular and subcortical white matter compatible with chronic microvascular ischemic change. Mild diffuse cerebral volume loss. Vascular: Atherosclerotic calcifications involving the large vessels of the skull base. No unexpected hyperdense vessel. Skull: Normal. Negative for fracture or focal lesion. Sinuses/Orbits: Stable postsurgical  changes to the right maxillary sinus. Remaining paranasal sinuses and mastoid air cells are clear. Orbital structures unremarkable. Other: None. IMPRESSION: 1. No acute intracranial findings. 2. Stable right frontal encephalomalacia and chronic microvascular ischemic changes of the white matter. Electronically Signed   By: Davina Poke D.O.   On: 11/01/2019 13:47   DG Chest Port 1 View  Result Date: 11/01/2019 CLINICAL DATA:  Fever hypoxia EXAM: PORTABLE CHEST 1 VIEW COMPARISON:  12/13/2017 FINDINGS: Cardiomediastinal contours remain enlarged with central pulmonary vascular engorgement. Mild pulmonary edema likely present as well with subtle septal lines at the periphery. RIGHT hemidiaphragm remains slightly elevated. Node dense consolidation. Improved aeration at the RIGHT lung base compared to the previous exam. Visualized skeletal structures are unremarkable. IMPRESSION: 1. Findings suggestive of mild pulmonary edema. 2. Improved aeration at the RIGHT lung base compared to the previous exam likely with atelectasis on today's study. 3. Stable enlargement of the cardiomediastinal contours. Electronically Signed   By: Zetta Bills M.D.   On: 11/01/2019 11:51    EKG: Personally reviewed by me which shows some PACs.  Assessment/Plan Principal Problem:   Pneumonia due to COVID-19 virus Active Problems:   Hypothyroidism   Hypercholesteremia   Morbid obesity ([place])   COPD (chronic obstructive pulmonary disease) ([place])   GERD   Controlled type 2 diabetes with neuropathy ([place])   Respiratory failure ([place])  Pneumonia secondary to COVID-19 with acute hypoxic respiratory failure on presentation.  Continue remdesivir IV dexamethasone.  Currently on 2 L of nasal cannula oxygen.  Will check  inflammatory biomarkers.  Continue bronchodilators, oxygen.  Closely monitor. Temp max of 102.66F. Patient had pulse ox of 87% on RA initially.  Chest x-ray shows evidence of vascular congestion.  Lactate was 1.0.  We  will also obtain procalcitonin.  No leukocytosis.  History of COPD.  Continue oxygen, bronchodilators. IV steroids.  Mild rhonchi noted. Not on oxygen at home.  GERD.  Continue PPI.  Type 2 diabetes mellitus with neuropathy.  We will put patient on sliding scale insulin, meal time insulin, Accu-Cheks, diabetic diet.  On Humulin at home.  We will add Lantus at bedtime.  History of hypothyroidism.  In the records.  Did not see Synthroid.  Check TSH.  Essential hypertension.  Continue amlodipine, metoprolol.  Will monitor blood pressure.  Hyperlipidemia.  Continue Lipitor.  Mild acute kidney injury on chronic kidney disease likely stage IIIa.  Creatinine of 1.4 one year back.  Creatinine on admission 2.2.  Hold Lasix for now.  Will decrease the dose of gabapentin for renal dysfunction.   Hold Lasix.  Avoid IV fluids today.  Check BMP in a.m. Urine analysis showed proteinuria.  Dementia.  Continue memantine.  Debility. Has a walker at home. Lives alone. Has Aide coming to the house.  Mild thrombocytopenia.  We will close monitor.  Check CBC in a.m.  DVT Prophylaxis: Heparin subcu for now.  Closely monitor for platelets  Consultant: None  Code Status: DNR  Microbiology blood culture and urine cultures have been sent  Antibiotics: We will hold off with antibiotics  Family  Communication:  Patients' condition and plan of care including tests being ordered have been discussed with the patient and the patient's daughter who indicate understanding and agree with the plan.  Disposition Plan: Home  Severity of Illness:  The appropriate patient status for this patient is INPATIENT. Inpatient status is judged to be reasonable and necessary in order to provide the required intensity of service to ensure the patient's safety. The patient's presenting symptoms, physical exam findings, and initial radiographic and laboratory data in the context of their chronic comorbidities is felt to place them  at high risk for further clinical deterioration. Furthermore, it is not anticipated that the patient will be medically stable for discharge from the hospital within 2 midnights of admission. The following factors support the patient status of inpatient.   I certify that at the point of admission it is my clinical judgment that the patient will require inpatient hospital care spanning beyond 2 midnights from the point of admission due to high intensity of service, high risk for further deterioration and high frequency of surveillance required.   Signed, Flora Lipps, MD [facility] 11/01/2019

## 2019-11-02 ENCOUNTER — Telehealth: Payer: Self-pay | Admitting: Family Medicine

## 2019-11-02 DIAGNOSIS — J1282 Pneumonia due to Coronavirus disease 2019: Secondary | ICD-10-CM | POA: Diagnosis not present

## 2019-11-02 DIAGNOSIS — U071 COVID-19: Principal | ICD-10-CM

## 2019-11-02 LAB — COMPREHENSIVE METABOLIC PANEL
ALT: 32 U/L (ref 0–44)
AST: 60 U/L — ABNORMAL HIGH (ref 15–41)
Albumin: 3 g/dL — ABNORMAL LOW (ref 3.5–5.0)
Alkaline Phosphatase: 78 U/L (ref 38–126)
Anion gap: 8 (ref 5–15)
BUN: 57 mg/dL — ABNORMAL HIGH (ref 8–23)
CO2: 21 mmol/L — ABNORMAL LOW (ref 22–32)
Calcium: 8.4 mg/dL — ABNORMAL LOW (ref 8.9–10.3)
Chloride: 107 mmol/L (ref 98–111)
Creatinine, Ser: 2.04 mg/dL — ABNORMAL HIGH (ref 0.44–1.00)
GFR calc Af Amer: 26 mL/min — ABNORMAL LOW (ref >60)
GFR calc non Af Amer: 22 mL/min — ABNORMAL LOW (ref >60)
Glucose, Bld: 213 mg/dL — ABNORMAL HIGH (ref 70–99)
Potassium: 6 mmol/L — ABNORMAL HIGH (ref 3.5–5.1)
Sodium: 136 mmol/L (ref 135–145)
Total Bilirubin: 0.6 mg/dL (ref 0.3–1.2)
Total Protein: 8.1 g/dL (ref 6.5–8.1)

## 2019-11-02 LAB — TSH: TSH: 0.37 u[IU]/mL (ref 0.350–4.500)

## 2019-11-02 LAB — CBC WITH DIFFERENTIAL/PLATELET
Abs Immature Granulocytes: 0.02 10*3/uL (ref 0.00–0.07)
Basophils Absolute: 0 10*3/uL (ref 0.0–0.1)
Basophils Relative: 0 %
Eosinophils Absolute: 0 10*3/uL (ref 0.0–0.5)
Eosinophils Relative: 0 %
HCT: 41 % (ref 36.0–46.0)
Hemoglobin: 13.2 g/dL (ref 12.0–15.0)
Immature Granulocytes: 1 %
Lymphocytes Relative: 35 %
Lymphs Abs: 1.3 10*3/uL (ref 0.7–4.0)
MCH: 24.6 pg — ABNORMAL LOW (ref 26.0–34.0)
MCHC: 32.2 g/dL (ref 30.0–36.0)
MCV: 76.4 fL — ABNORMAL LOW (ref 80.0–100.0)
Monocytes Absolute: 0.3 10*3/uL (ref 0.1–1.0)
Monocytes Relative: 9 %
Neutro Abs: 2 10*3/uL (ref 1.7–7.7)
Neutrophils Relative %: 55 %
Platelets: 114 10*3/uL — ABNORMAL LOW (ref 150–400)
RBC: 5.37 MIL/uL — ABNORMAL HIGH (ref 3.87–5.11)
RDW: 14 % (ref 11.5–15.5)
WBC: 3.7 10*3/uL — ABNORMAL LOW (ref 4.0–10.5)
nRBC: 0 % (ref 0.0–0.2)

## 2019-11-02 LAB — CBG MONITORING, ED
Glucose-Capillary: 197 mg/dL — ABNORMAL HIGH (ref 70–99)
Glucose-Capillary: 184 mg/dL — ABNORMAL HIGH (ref 70–99)
Glucose-Capillary: 177 mg/dL — ABNORMAL HIGH (ref 70–99)
Glucose-Capillary: 206 mg/dL — ABNORMAL HIGH (ref 70–99)

## 2019-11-02 LAB — URINE CULTURE: Culture: NO GROWTH

## 2019-11-02 LAB — C-REACTIVE PROTEIN: CRP: 7.2 mg/dL — ABNORMAL HIGH (ref <1.0)

## 2019-11-02 LAB — D-DIMER, QUANTITATIVE: D-Dimer, Quant: 2.11 ug/mL-FEU — ABNORMAL HIGH (ref 0.00–0.50)

## 2019-11-02 LAB — BRAIN NATRIURETIC PEPTIDE: B Natriuretic Peptide: 89.1 pg/mL (ref 0.0–100.0)

## 2019-11-02 LAB — MAGNESIUM: Magnesium: 2.2 mg/dL (ref 1.7–2.4)

## 2019-11-02 LAB — POTASSIUM: Potassium: 5.3 mmol/L — ABNORMAL HIGH (ref 3.5–5.1)

## 2019-11-02 LAB — FERRITIN: Ferritin: 929 ng/mL — ABNORMAL HIGH (ref 11–307)

## 2019-11-02 MED ORDER — SODIUM POLYSTYRENE SULFONATE 15 GM/60ML PO SUSP: 15.0000 g | Freq: Once | ORAL | Status: DC

## 2019-11-02 MED ORDER — DICLOFENAC SODIUM 1 % EX GEL
4.0000 g | Freq: Four times a day (QID) | CUTANEOUS | Status: DC
  Administered 2019-11-02 – 2019-11-08 (×21): 4 g via TOPICAL
  Filled 2019-11-02 (×2): qty 100

## 2019-11-02 MED ORDER — SODIUM ZIRCONIUM CYCLOSILICATE 5 G PO PACK
5.0000 g | PACK | Freq: Once | ORAL | Status: AC
  Administered 2019-11-02: 5 g via ORAL
  Filled 2019-11-02: qty 1

## 2019-11-02 NOTE — Progress Notes (Signed)
PROGRESS NOTE    Brittany Roberts  JTT:[id number] DOB: 1941/03/11 DOA: 11/01/2019 PCP: [name], [name], MD   Brief Narrative:  Brittany Roberts is a 79 y.o. female with past medical history of bipolar disorder, congestive heart failure, coronary artery disease, depression, fibromyalgia, dementia, GERD,  hypothyroidism, hypertension, diabetes mellitus, chronic kidney disease presented to the hospital with complaints of fatigue, weakness and shortness of breath.  Patient does have underlying dementia but has been getting little worse over the last few days.  Patient denies any chest pain, palpitation or productive cough.  Patient states that her granddaughter had recently visited after which she started having some dry cough,  throat irritation and dyspnea which is getting worse over the last few days.  She complains mostly of congestion in her chest.  Patient denies any dizziness, nausea, vomiting.  Denies diarrhea headache, urinary urgency frequency or dysuria.  She complains of a decreased appetite.  ED Course: In the ED, patient noted to be hypoxic and required 2 L of nasal cannula oxygen.  BUN was 40 with a creatinine of 2.2.  Urine culture blood cultures were sent to the ED.  Chest x-ray showed some congestive findings.  COVID 19-Was positive.  Patient was then considered for admission to the hospital.   Assessment & Plan:   Principal Problem:   Pneumonia due to COVID-19 virus Active Problems:   Hypothyroidism   Hypercholesteremia   Morbid obesity ([place])   COPD (chronic obstructive pulmonary disease) ([place])   GERD   Controlled type 2 diabetes with neuropathy ([place])   Respiratory failure ([place])  Pneumonia secondary to COVID-19 with acute hypoxic respiratory failure on presentation.  Continue remdesivir IV dexamethasone IV.  Currently on 2 L of nasal cannula oxygen.  Will moniter inflammatory biomarkers.  Continue bronchodilators, oxygen.  Closely monitor. Temp max of 102.39F. Patient had pulse ox of  87% on RA initially.  Chest x-ray shows evidence of vascular congestion.  Lactate was 1.0.  procalcitonin 0.42 . No leukocytosis.  History of COPD.  Continue oxygen, bronchodilators. IV steroids.  Mild rhonchi noted. Not on oxygen at home.  GERD.  Continue PPI.  Type 2 diabetes mellitus with neuropathy.  We will put patient on sliding scale insulin, meal time insulin, Accu-Cheks, diabetic diet.  On Humulin at home.  We will add Lantus at bedtime.  History of hypothyroidism.  In the records.  Did not see Synthroid.  Check TSH.  Essential hypertension.  Continue amlodipine, metoprolol.  Will monitor blood pressure.  Hyperlipidemia.  Continue Lipitor.  Mild acute kidney injury on chronic kidney disease likely stage IIIa.  Creatinine of 1.4 one year back.  Creatinine on admission 2.2.  Hold Lasix for now.  Will decrease the dose of gabapentin for renal dysfunction.   Hold Lasix.  Avoid IV fluids today.  Check BMP in a.m. Urine analysis showed proteinuria.  Dementia.  Continue memantine.  Debility. Has a walker at home. Lives alone. Has Aide coming to the house.  Mild thrombocytopenia.  We will close monitor.  Check CBC in a.m.  DVT Prophylaxis: Heparin subcu for now.  Closely monitor for platelets  Consultant: None  Code Status: DNR  Microbiology blood culture and urine cultures have been sent  Antibiotics: We will hold off with antibiotics  Family Communication:  Patients' condition and plan of care including tests being ordered have been discussed with the patient and the patient's daughter who indicate understanding and agree with the plan.  Disposition Plan: Home  Consultants:    None.  Procedures:  None. Antimicrobials:  Anti-infectives (From admission, onward)   Start     Dose/Rate Route Frequency Ordered Stop   11/02/19 1000  remdesivir 100 mg in sodium chloride 0.9 % 100 mL IVPB     100 mg 200 mL/hr over 30 Minutes Intravenous Daily 11/01/19 1312  11/06/19 0959   11/01/19 1330  remdesivir 100 mg in sodium chloride 0.9 % 100 mL IVPB     100 mg 200 mL/hr over 30 Minutes Intravenous Every 1 hr x 2 11/01/19 1312 11/01/19 1548   11/01/19 1200  levofloxacin (LEVAQUIN) IVPB 750 mg     750 mg 100 mL/hr over 90 Minutes Intravenous  Once 11/01/19 1151 11/01/19 1425      Subjective: Patient was seen and examined at bedside.  She appears comfortable on 2 L supplemental oxygen saturating 94%.   She denies any chest pain.  She appears very weak and frail.  Objective: Vitals:   11/02/19 1245 11/02/19 1300 11/02/19 1400 11/02/19 1500  BP:  (!) 129/96 (!) 127/101 138/77  Pulse: 73 73 64 69  Resp:  18 18 16   Temp:      TempSrc:      SpO2: 93% 94% 96% 95%  Weight:      Height:        Intake/Output Summary (Last 24 hours) at 11/02/2019 1604 Last data filed at 11/02/2019 1223 Gross per 24 hour  Intake 100 ml  Output --  Net 100 ml   Filed Weights   11/01/19 1100  Weight: 130.6 kg    Examination:  General exam: Appears calm and comfortable  Respiratory system: Clear to auscultation. Respiratory effort normal. Cardiovascular system: S1 & S2 heard, RRR. No JVD, murmurs, rubs, gallops or clicks. No pedal edema. Gastrointestinal system: Abdomen is nondistended, soft and nontender. No organomegaly or masses felt. Normal bowel sounds heard. Central nervous system: Alert and oriented. No focal neurological deficits. Extremities: Symmetric 5 x 5 power. Skin: No rashes, lesions or ulcers Psychiatry: Judgement and insight appear normal. Mood & affect appropriate.     Data Reviewed: I have personally reviewed following labs and imaging studies  CBC: Recent Labs  Lab 11/01/19 1120 11/02/19 0627  WBC 6.9 3.7*  NEUTROABS 4.1 2.0  HGB 12.6 13.2  HCT 36.8 41.0  MCV 75.3* 76.4*  PLT 108* 259*   Basic Metabolic Panel: Recent Labs  Lab 11/01/19 1120 11/02/19 0627 11/02/19 1130  NA 137 136  --   K 4.9 6.0* 5.3*  CL 107 107  --    CO2 22 21*  --   GLUCOSE 95 213*  --   BUN 51* 57*  --   CREATININE 2.27* 2.04*  --   CALCIUM 8.5* 8.4*  --   MG  --  2.2  --    GFR: Estimated Creatinine Clearance: 29.1 mL/min (A) (by C-G formula based on SCr of 2.04 mg/dL (H)). Liver Function Tests: Recent Labs  Lab 11/01/19 1120 11/02/19 0627  AST 52* 60*  ALT 29 32  ALKPHOS 79 78  BILITOT 0.5 0.6  PROT 7.9 8.1  ALBUMIN 3.2* 3.0*   No results for input(s): LIPASE, AMYLASE in the last 168 hours. No results for input(s): AMMONIA in the last 168 hours. Coagulation Profile: Recent Labs  Lab 11/01/19 1120  INR 1.0   Cardiac Enzymes: No results for input(s): CKTOTAL, CKMB, CKMBINDEX, TROPONINI in the last 168 hours. BNP (last 3 results) No results for input(s): PROBNP in  the last 8760 hours. HbA1C: No results for input(s): HGBA1C in the last 72 hours. CBG: Recent Labs  Lab 11/01/19 1818 11/01/19 2322 11/02/19 0818 11/02/19 1139  GLUCAP 124* 193* 197* 184*   Lipid Profile: No results for input(s): CHOL, HDL, LDLCALC, TRIG, CHOLHDL, LDLDIRECT in the last 72 hours. Thyroid Function Tests: Recent Labs    11/02/19 0626  TSH 0.370   Anemia Panel: Recent Labs    11/01/19 1120 11/02/19 0627  FERRITIN 941* 929*   Sepsis Labs: Recent Labs  Lab 11/01/19 1120  PROCALCITON 0.42  LATICACIDVEN 1.0    Recent Results (from the past 240 hour(s))  Blood Culture (routine x 2)     Status: None (Preliminary result)   Collection Time: 11/01/19 11:20 AM   Specimen: BLOOD  Result Value Ref Range Status   Specimen Description   Final    BLOOD LEFT WRIST Performed at [facility] [address], [place], [place] [address]    Special Requests   Final    BOTTLES DRAWN AEROBIC AND ANAEROBIC Blood Culture results may not be optimal due to an inadequate volume of blood received in culture bottles Performed at [facility] [address], [place], [place] [address]    Culture    Final    NO GROWTH < 24 HOURS Performed at [facility] [address], [place], [place] [address]    Report Status PENDING  Incomplete  Urine culture     Status: None   Collection Time: 11/01/19 12:18 PM   Specimen: In/Out Cath Urine  Result Value Ref Range Status   Specimen Description   Final    IN/OUT CATH URINE Performed at [facility] [address] [place], [place] [address]    Special Requests   Final    NONE Performed at [facility], [place] [address], [place], [place] [address]    Culture   Final    NO GROWTH Performed at [facility], [place] [address], [place],  [address]    Report Status 11/02/2019 FINAL  Final         Radiology Studies: CT Head Wo Contrast  Result Date: 11/01/2019 CLINICAL DATA:  Generalized weakness, altered mental status EXAM: CT HEAD WITHOUT CONTRAST TECHNIQUE: Contiguous axial images were obtained from the base of the skull through the vertex without intravenous contrast. COMPARISON:  01/13/2017 FINDINGS: Brain: No evidence of acute infarction, hemorrhage, hydrocephalus, extra-axial collection or mass lesion/mass effect. Stable right frontal encephalomalacia. Scattered low-density changes within the periventricular and subcortical white matter compatible with chronic microvascular ischemic change. Mild diffuse cerebral volume loss. Vascular: Atherosclerotic calcifications involving the large vessels of the skull base. No unexpected hyperdense vessel. Skull: Normal. Negative for fracture or focal lesion. Sinuses/Orbits: Stable postsurgical changes to the right maxillary sinus. Remaining paranasal sinuses and mastoid air cells are clear. Orbital structures unremarkable. Other: None. IMPRESSION: 1. No acute intracranial findings. 2. Stable right frontal encephalomalacia and chronic microvascular ischemic changes of the white matter. Electronically Signed   By: Davina Poke D.O.   On: 11/01/2019  13:47   DG Chest Port 1 View  Result Date: 11/01/2019 CLINICAL DATA:  Fever hypoxia EXAM: PORTABLE CHEST 1 VIEW COMPARISON:  12/13/2017 FINDINGS: Cardiomediastinal contours remain enlarged with central pulmonary vascular engorgement. Mild pulmonary edema likely present as well with subtle septal lines at the periphery. RIGHT hemidiaphragm remains slightly elevated. Node dense consolidation. Improved aeration at the RIGHT lung base compared  to the previous exam. Visualized skeletal structures are unremarkable. IMPRESSION: 1. Findings suggestive of mild pulmonary edema. 2. Improved aeration at the RIGHT lung base compared to the previous exam likely with atelectasis on today's study. 3. Stable enlargement of the cardiomediastinal contours. Electronically Signed   By: Zetta Bills M.D.   On: 11/01/2019 11:51        Scheduled Meds: . allopurinol  100 mg Oral Daily  . amLODipine  5 mg Oral Daily  . aspirin EC  81 mg Oral Daily  . atorvastatin  20 mg Oral Daily  . dexamethasone (DECADRON) injection  6 mg Intravenous Q24H  . diclofenac Sodium  4 g Topical QID  . DULoxetine  60 mg Oral Daily  . fluticasone furoate-vilanterol  1 puff Inhalation Daily  . gabapentin  100 mg Oral BID  . guaiFENesin  600 mg Oral BID  . heparin  5,000 Units Subcutaneous Q8H  . insulin aspart  0-15 Units Subcutaneous TID WC  . insulin aspart  0-5 Units Subcutaneous QHS  . insulin aspart  4 Units Subcutaneous TID WC  . insulin glargine  10 Units Subcutaneous QHS  . isosorbide mononitrate  30 mg Oral Daily  . levETIRAcetam  500 mg Oral BID  . linaclotide  145 mcg Oral QAC breakfast  . memantine  10 mg Oral BID  . metoprolol succinate  50 mg Oral Daily  . montelukast  10 mg Oral QHS  . pantoprazole  40 mg Oral Daily   Continuous Infusions: . remdesivir 100 mg in NS 100 mL Stopped (11/02/19 1223)     LOS: 1 day    Time spent: 25 mins.    Shawna Clamp, MD [facility]   If 7PM-7AM, please  contact night-coverage

## 2019-11-02 NOTE — Progress Notes (Signed)
PT Note  Patient Details Name: Brittany Roberts MRN: [id number] DOB: 1941/06/27      Noted order in the ED and pt for admission. Will see pt when up on the unit. Call if you need [place] sooner, and or we will check back 11/03/2019 as well.    Clide Dales 11/02/2019, 5:58 PM

## 2019-11-02 NOTE — ED Notes (Signed)
PT was placed on bedpan and removed when finished, PT was provided perineal care, changed, repositioned, barrier cream applied, PT placed on new purewick. By EMT & RN's.

## 2019-11-02 NOTE — ED Notes (Signed)
Pt lying in bed watching t.v. states she wants to go to sleep. NAD noted. Will continue to monitor.

## 2019-11-03 DIAGNOSIS — U071 COVID-19: Secondary | ICD-10-CM | POA: Diagnosis not present

## 2019-11-03 DIAGNOSIS — J1282 Pneumonia due to Coronavirus disease 2019: Secondary | ICD-10-CM | POA: Diagnosis not present

## 2019-11-03 LAB — COMPREHENSIVE METABOLIC PANEL
ALT: 33 U/L (ref 0–44)
AST: 59 U/L — ABNORMAL HIGH (ref 15–41)
Albumin: 2.8 g/dL — ABNORMAL LOW (ref 3.5–5.0)
Alkaline Phosphatase: 75 U/L (ref 38–126)
Anion gap: 6 (ref 5–15)
BUN: 60 mg/dL — ABNORMAL HIGH (ref 8–23)
CO2: 20 mmol/L — ABNORMAL LOW (ref 22–32)
Calcium: 8.6 mg/dL — ABNORMAL LOW (ref 8.9–10.3)
Chloride: 110 mmol/L (ref 98–111)
Creatinine, Ser: 1.97 mg/dL — ABNORMAL HIGH (ref 0.44–1.00)
GFR calc Af Amer: 27 mL/min — ABNORMAL LOW (ref >60)
GFR calc non Af Amer: 23 mL/min — ABNORMAL LOW (ref >60)
Glucose, Bld: 232 mg/dL — ABNORMAL HIGH (ref 70–99)
Potassium: 5.8 mmol/L — ABNORMAL HIGH (ref 3.5–5.1)
Sodium: 136 mmol/L (ref 135–145)
Total Bilirubin: 0.7 mg/dL (ref 0.3–1.2)
Total Protein: 7.5 g/dL (ref 6.5–8.1)

## 2019-11-03 LAB — CBC WITH DIFFERENTIAL/PLATELET
Abs Immature Granulocytes: 0.04 10*3/uL (ref 0.00–0.07)
Basophils Absolute: 0 10*3/uL (ref 0.0–0.1)
Basophils Relative: 0 %
Eosinophils Absolute: 0 10*3/uL (ref 0.0–0.5)
Eosinophils Relative: 0 %
HCT: 37.4 % (ref 36.0–46.0)
Hemoglobin: 12.4 g/dL (ref 12.0–15.0)
Immature Granulocytes: 1 %
Lymphocytes Relative: 23 %
Lymphs Abs: 1.6 10*3/uL (ref 0.7–4.0)
MCH: 24.8 pg — ABNORMAL LOW (ref 26.0–34.0)
MCHC: 33.2 g/dL (ref 30.0–36.0)
MCV: 74.8 fL — ABNORMAL LOW (ref 80.0–100.0)
Monocytes Absolute: 0.3 10*3/uL (ref 0.1–1.0)
Monocytes Relative: 5 %
Neutro Abs: 4.8 10*3/uL (ref 1.7–7.7)
Neutrophils Relative %: 71 %
Platelets: 142 10*3/uL — ABNORMAL LOW (ref 150–400)
RBC: 5 MIL/uL (ref 3.87–5.11)
RDW: 13.7 % (ref 11.5–15.5)
WBC: 6.7 10*3/uL (ref 4.0–10.5)
nRBC: 0 % (ref 0.0–0.2)

## 2019-11-03 LAB — D-DIMER, QUANTITATIVE: D-Dimer, Quant: 1.7 ug/mL-FEU — ABNORMAL HIGH (ref 0.00–0.50)

## 2019-11-03 LAB — FERRITIN: Ferritin: 947 ng/mL — ABNORMAL HIGH (ref 11–307)

## 2019-11-03 LAB — CBG MONITORING, ED
Glucose-Capillary: 226 mg/dL — ABNORMAL HIGH (ref 70–99)
Glucose-Capillary: 221 mg/dL — ABNORMAL HIGH (ref 70–99)
Glucose-Capillary: 164 mg/dL — ABNORMAL HIGH (ref 70–99)
Glucose-Capillary: 205 mg/dL — ABNORMAL HIGH (ref 70–99)

## 2019-11-03 LAB — MAGNESIUM: Magnesium: 2.1 mg/dL (ref 1.7–2.4)

## 2019-11-03 LAB — C-REACTIVE PROTEIN: CRP: 4.6 mg/dL — ABNORMAL HIGH (ref <1.0)

## 2019-11-03 LAB — POTASSIUM: Potassium: 5.2 mmol/L — ABNORMAL HIGH (ref 3.5–5.1)

## 2019-11-03 MED ORDER — SODIUM ZIRCONIUM CYCLOSILICATE 5 G PO PACK
5.0000 g | PACK | Freq: Once | ORAL | Status: AC
  Administered 2019-11-03: 5 g via ORAL
  Filled 2019-11-03: qty 1

## 2019-11-03 NOTE — Evaluation (Signed)
Physical Therapy Evaluation Patient Details Name: Brittany Roberts MRN: [id number] DOB: 1940-08-26 Today's Date: 11/03/2019   History of Present Illness  Pt admitted with SOB 2 COVID PNA; Pt with hx of DM, Lupus, Peripheral neuropathy, CAD, CHF, Bipolar, and memory loss  Clinical Impression  Pt admitted as above and presenting with functional mobility limitations 2* generalized weakness, ambulatory balance deficits, limited endurance and obesity.  Pt would benefit from follow up rehab at SNF level to maximize IND and safety prior to return home with ltd assist - dependent on acute stay progress and family ability to provide additional supervision/assist.    Follow Up Recommendations SNF    Equipment Recommendations  None recommended by PT    Recommendations for Other Services       Precautions / Restrictions Precautions Precautions: Fall      Mobility  Bed Mobility Overal bed mobility: Needs Assistance Bed Mobility: Supine to Sit;Sit to Supine     Supine to sit: Min assist;Mod assist Sit to supine: Min assist;Mod assist   General bed mobility comments: Increased time with assist to bring trunk to upright and to bring bil LEs into bed  Transfers Overall transfer level: Needs assistance Equipment used: Rolling walker (2 wheeled) Transfers: Sit to/from Stand Sit to Stand: Min assist         General transfer comment: cues for LE management and use of UEs to self assist.  Ambulation/Gait Ambulation/Gait assistance: Min assist Gait Distance (Feet): 20 Feet Assistive device: Rolling walker (2 wheeled) Gait Pattern/deviations: Step-to pattern;Step-through pattern;Decreased step length - right;Decreased step length - left;Shuffle;Trunk flexed Gait velocity: decr   General Gait Details: Increased time with cues for posture and position from RW and standing rest break 2* fatigue  Stairs            Wheelchair Mobility    Modified Rankin (Stroke Patients Only)        Balance Overall balance assessment: Needs assistance Sitting-balance support: Bilateral upper extremity supported;Feet supported Sitting balance-Leahy Scale: Good     Standing balance support: No upper extremity supported Standing balance-Leahy Scale: Poor                               Pertinent Vitals/Pain Pain Assessment: No/denies pain    Home Living Family/patient expects to be discharged to:: Private residence Living Arrangements: Alone   Type of Home: Apartment Home Access: Level entry     Home Layout: One level Home Equipment: Walker - 4 wheels Additional Comments: Pt lives alone - states her dtr lives across the street    Prior Function Level of Independence: Needs assistance   Gait / Transfers Assistance Needed: uses RW  ADL's / Homemaking Assistance Needed: Pt states she does her own cooking and cleaning; previous chart from 2019 states pt had assist of aide 2-3 hrs a day        Hand Dominance   Dominant Hand: Right    Extremity/Trunk Assessment   Upper Extremity Assessment Upper Extremity Assessment: Generalized weakness    Lower Extremity Assessment Lower Extremity Assessment: Generalized weakness       Communication   Communication: No difficulties  Cognition Arousal/Alertness: Awake/alert Behavior During Therapy: WFL for tasks assessed/performed Overall Cognitive Status: History of cognitive impairments - at baseline  General Comments      Exercises     Assessment/Plan    PT Assessment Patient needs continued PT services  PT Problem List Decreased strength;Decreased activity tolerance;Decreased balance;Decreased mobility;Decreased knowledge of use of DME;Obesity       PT Treatment Interventions DME instruction;Gait training;Functional mobility training;Therapeutic activities;Therapeutic exercise;Patient/family education    PT Goals (Current goals can be found in  the Care Plan section)  Acute Rehab PT Goals Patient Stated Goal: Regain IND PT Goal Formulation: With patient Time For Goal Achievement: 11/17/19 Potential to Achieve Goals: Fair    Frequency Min 3X/week   Barriers to discharge Decreased caregiver support Home alone    Co-evaluation               AM-PAC PT "6 Clicks" Mobility  Outcome Measure Help needed turning from your back to your side while in a flat bed without using bedrails?: A Lot Help needed moving from lying on your back to sitting on the side of a flat bed without using bedrails?: A Lot Help needed moving to and from a bed to a chair (including a wheelchair)?: A Lot Help needed standing up from a chair using your arms (e.g., wheelchair or bedside chair)?: A Lot Help needed to walk in hospital room?: A Lot Help needed climbing 3-5 steps with a railing? : A Lot 6 Click Score: 12    End of Session Equipment Utilized During Treatment: Oxygen Activity Tolerance: Patient tolerated treatment well;Patient limited by fatigue Patient left: in bed;with call bell/phone within reach Nurse Communication: Mobility status PT Visit Diagnosis: Difficulty in walking, not elsewhere classified (R26.2);Muscle weakness (generalized) (M62.81)    Time: 1536-1610 PT Time Calculation (min) (ACUTE ONLY): 34 min   Charges:   PT Evaluation $PT Eval Low Complexity: 1 Low PT Treatments $Gait Training: 8-22 mins        Mauro Kaufmann PT Acute Rehabilitation Services Pager [phone] Office [phone]   Bart Ashford 11/03/2019, 4:34 PM

## 2019-11-03 NOTE — ED Notes (Signed)
Pt lying in bed. Repositioned in bed. NAD noted. Pt denies any needs. Will continue to monitor.

## 2019-11-03 NOTE — ED Notes (Signed)
Pt lying in bed, eye's closed, chest rising and falling. NAD noted. Will continue to monitor.

## 2019-11-03 NOTE — Progress Notes (Signed)
PROGRESS NOTE    CINCERE DEPREY  ZOX:[id number] DOB: 1940-07-16 DOA: 11/01/2019 PCP: [name], [name], MD   Brief Narrative:  Brittany Roberts is a 79 y.o. female with past medical history of bipolar disorder, congestive heart failure, coronary artery disease, depression, fibromyalgia, dementia, GERD,  hypothyroidism, hypertension, diabetes mellitus, chronic kidney disease presented to the hospital with complaints of fatigue, weakness and shortness of breath.  Patient does have underlying dementia but has been getting little worse over the last few days.  Patient denies any chest pain, palpitation or productive cough.  Patient states that her granddaughter had recently visited after which she started having some dry cough,  throat irritation and dyspnea which is getting worse over the last few days.  She complains mostly of congestion in her chest.  Patient denies any dizziness, nausea, vomiting.  Denies diarrhea headache, urinary urgency frequency or dysuria.  She complains of a decreased appetite.  ED Course: In the ED, patient noted to be hypoxic and required 2 L of nasal cannula oxygen.  BUN was 40 with a creatinine of 2.2.  Urine culture blood cultures were sent to the ED.  Chest x-ray showed some congestive findings.  COVID 19-Was positive.  Patient was then considered for admission to the hospital.   Assessment & Plan:   Principal Problem:   Pneumonia due to COVID-19 virus Active Problems:   Hypothyroidism   Hypercholesteremia   Morbid obesity ([place])   COPD (chronic obstructive pulmonary disease) ([place])   GERD   Controlled type 2 diabetes with neuropathy ([place])   Respiratory failure ([place])  Pneumonia secondary to COVID-19 with acute hypoxic respiratory failure on presentation. Continue remdesivir IV dexamethasone IV.  Currently on 2 L of nasal cannula oxygen.  Will moniter inflammatory biomarkers.  Continue bronchodilators, oxygen.  Closely monitor. Temp max of 102.67F. Patient had pulse ox of 87%  on RA initially.  Chest x-ray shows evidence of vascular congestion.  Lactate was 1.0.  procalcitonin 0.42 . No leukocytosis.  History of COPD.  Continue oxygen, bronchodilators. IV steroids.  Mild rhonchi noted. Not on oxygen at home.  GERD.  Continue PPI.  Type 2 diabetes mellitus with neuropathy.  We will put patient on sliding scale insulin, meal time insulin, Accu-Cheks, diabetic diet.  On Humulin at home.  We will add Lantus at bedtime.  History of hypothyroidism.  In the records.  Did not see Synthroid.  Check TSH.  Essential hypertension.  Continue amlodipine, metoprolol.  Will monitor blood pressure.  Hyperlipidemia.  Continue Lipitor.  Mild acute kidney injury on chronic kidney disease likely stage IIIa.  Creatinine of 1.4 one year back. Creatinine on admission 2.2.  Hold Lasix for now.  Will decrease the dose of gabapentin for renal dysfunction.   Hold Lasix.  Avoid IV fluids today.  Check BMP in a.m. Urine analysis showed proteinuria.  Hyperkalemia: Lokelma 5g given, recheck labs in am.  Dementia.  Continue memantine.  Debility. Has a walker at home. Lives alone. Has Aide coming to the house.  Mild thrombocytopenia.  We will close monitor.  Check CBC in a.m.  DVT Prophylaxis: Heparin subcu for now.  Closely monitor for platelets  Consultant: None  Code Status: DNR  Microbiology blood culture and urine cultures have been sent  Antibiotics: We will hold off with antibiotics  Family Communication:  Patients' condition and plan of care including tests being ordered have been discussed with the patient and the patient's daughter who indicate understanding and agree with the  plan.  Disposition Plan: Home   Consultants:    None.  Procedures:  None. Antimicrobials:  Anti-infectives (From admission, onward)   Start     Dose/Rate Route Frequency Ordered Stop   11/02/19 1000  remdesivir 100 mg in sodium chloride 0.9 % 100 mL IVPB     100 mg 200  mL/hr over 30 Minutes Intravenous Daily 11/01/19 1312 11/06/19 0959   11/01/19 1330  remdesivir 100 mg in sodium chloride 0.9 % 100 mL IVPB     100 mg 200 mL/hr over 30 Minutes Intravenous Every 1 hr x 2 11/01/19 1312 11/01/19 1548   11/01/19 1200  levofloxacin (LEVAQUIN) IVPB 750 mg     750 mg 100 mL/hr over 90 Minutes Intravenous  Once 11/01/19 1151 11/01/19 1425      Subjective: Patient was seen and examined at bedside.  She appears comfortable on 2 L supplemental oxygen saturating 94%.  She denies any chest pain.  She appears very weak and frail.  Objective: Vitals:   11/03/19 0823 11/03/19 0900 11/03/19 1000 11/03/19 1154  BP: 135/76 119/90 130/61 138/66  Pulse: (!) 57 61 (!) 55 (!) 54  Resp: 16 16 16  (!) 24  Temp:      TempSrc:      SpO2: 98% 97% 97% 93%  Weight:      Height:        Intake/Output Summary (Last 24 hours) at 11/03/2019 1411 Last data filed at 11/03/2019 0838 Gross per 24 hour  Intake --  Output 900 ml  Net -900 ml   Filed Weights   11/01/19 1100  Weight: 130.6 kg    Examination:  General exam: Appears calm and comfortable  Respiratory system: Clear to auscultation. Respiratory effort normal. Cardiovascular system: S1 & S2 heard, RRR. No JVD, murmurs, rubs, gallops or clicks. No pedal edema. Gastrointestinal system: Abdomen is nondistended, soft and nontender. No organomegaly or masses felt. Normal bowel sounds heard. Central nervous system: Alert and oriented. No focal neurological deficits. Extremities: Symmetric 5 x 5 power. Skin: No rashes, lesions or ulcers Psychiatry: Judgement and insight appear normal. Mood & affect appropriate.     Data Reviewed: I have personally reviewed following labs and imaging studies  CBC: Recent Labs  Lab 11/01/19 1120 11/02/19 0627 11/03/19 0500  WBC 6.9 3.7* 6.7  NEUTROABS 4.1 2.0 4.8  HGB 12.6 13.2 12.4  HCT 36.8 41.0 37.4  MCV 75.3* 76.4* 74.8*  PLT 108* 114* 979*   Basic Metabolic Panel: Recent  Labs  Lab 11/01/19 1120 11/02/19 0627 11/02/19 1130 11/03/19 0500  NA 137 136  --  136  K 4.9 6.0* 5.3* 5.8*  CL 107 107  --  110  CO2 22 21*  --  20*  GLUCOSE 95 213*  --  232*  BUN 51* 57*  --  60*  CREATININE 2.27* 2.04*  --  1.97*  CALCIUM 8.5* 8.4*  --  8.6*  MG  --  2.2  --  2.1   GFR: Estimated Creatinine Clearance: 30.1 mL/min (A) (by C-G formula based on SCr of 1.97 mg/dL (H)). Liver Function Tests: Recent Labs  Lab 11/01/19 1120 11/02/19 0627 11/03/19 0500  AST 52* 60* 59*  ALT 29 32 33  ALKPHOS 79 78 75  BILITOT 0.5 0.6 0.7  PROT 7.9 8.1 7.5  ALBUMIN 3.2* 3.0* 2.8*   No results for input(s): LIPASE, AMYLASE in the last 168 hours. No results for input(s): AMMONIA in the last 168 hours. Coagulation Profile: Recent  Labs  Lab 11/01/19 1120  INR 1.0   Cardiac Enzymes: No results for input(s): CKTOTAL, CKMB, CKMBINDEX, TROPONINI in the last 168 hours. BNP (last 3 results) No results for input(s): PROBNP in the last 8760 hours. HbA1C: No results for input(s): HGBA1C in the last 72 hours. CBG: Recent Labs  Lab 11/02/19 1139 11/02/19 1710 11/02/19 2120 11/03/19 0801 11/03/19 1157  GLUCAP 184* 177* 206* 226* 221*   Lipid Profile: No results for input(s): CHOL, HDL, LDLCALC, TRIG, CHOLHDL, LDLDIRECT in the last 72 hours. Thyroid Function Tests: Recent Labs    11/02/19 0626  TSH 0.370   Anemia Panel: Recent Labs    11/02/19 0627 11/03/19 0500  FERRITIN 929* 947*   Sepsis Labs: Recent Labs  Lab 11/01/19 1120  PROCALCITON 0.42  LATICACIDVEN 1.0    Recent Results (from the past 240 hour(s))  Blood Culture (routine x 2)     Status: None (Preliminary result)   Collection Time: 11/01/19 11:20 AM   Specimen: BLOOD  Result Value Ref Range Status   Specimen Description   Final    BLOOD LEFT WRIST Performed at [facility] [address] [place], [place] [address]    Special Requests   Final    BOTTLES DRAWN AEROBIC AND  ANAEROBIC Blood Culture results may not be optimal due to an inadequate volume of blood received in culture bottles Performed at [facility] [address], [place], [place] [address]    Culture   Final    NO GROWTH 2 DAYS Performed at [facility] [address] [place], [place] [address]    Report Status PENDING  Incomplete  Urine culture     Status: None   Collection Time: 11/01/19 12:18 PM   Specimen: In/Out Cath Urine  Result Value Ref Range Status   Specimen Description   Final    IN/OUT CATH URINE Performed at [facility] [address], [place], [place] [address]    Special Requests   Final    NONE Performed at [facility], [place] [address] [place], [place] [address]    Culture   Final    NO GROWTH Performed at [facility], [place] [address] [place], [place] [address]    Report Status 11/02/2019 FINAL  Final     Radiology Studies: No results found.  Scheduled Meds: . allopurinol  100 mg Oral Daily  . amLODipine  5 mg Oral Daily  . aspirin EC  81 mg Oral Daily  . atorvastatin  20 mg Oral Daily  . dexamethasone (DECADRON) injection  6 mg Intravenous Q24H  . diclofenac Sodium  4 g Topical QID  . DULoxetine  60 mg Oral Daily  . fluticasone furoate-vilanterol  1 puff Inhalation Daily  . gabapentin  100 mg Oral BID  . guaiFENesin  600 mg Oral BID  . heparin  5,000 Units Subcutaneous Q8H  . insulin aspart  0-15 Units Subcutaneous TID WC  . insulin aspart  0-5 Units Subcutaneous QHS  . insulin aspart  4 Units Subcutaneous TID WC  . insulin glargine  10 Units Subcutaneous QHS  . isosorbide mononitrate  30 mg Oral Daily  . levETIRAcetam  500 mg Oral BID  . linaclotide  145 mcg Oral QAC breakfast  . memantine  10 mg Oral BID  . metoprolol succinate  50 mg Oral Daily  . montelukast  10 mg Oral QHS  . pantoprazole  40 mg Oral  Daily   Continuous Infusions: . remdesivir 100 mg in NS 100 mL 100 mg  (11/03/19 1137)     LOS: 2 days    Time spent: 25 mins.    Shawna Clamp, MD [facility]   If 7PM-7AM, please contact night-coverage

## 2019-11-03 NOTE — ED Notes (Addendum)
Enters my care. Report from night RN. Pt resting in hospital bed with [facility]. Arousable to voice. Denies pain or SHOB. Pt cleaned and repositioned for comfort. Morning meds given a/o. Purewick in place. Breakfast at bedside. Awaits admission room. NAD. No complaints. VSS. Cont to monitor.

## 2019-11-03 NOTE — Progress Notes (Signed)
Noted pt waiting transfer to unit.  Will perform OT eval when pt gets room. Kari Baars, OT [facility] [phone] [phone]

## 2019-11-03 NOTE — ED Notes (Signed)
Pt lying in bed, eye's closed, chest rising and falling. Will continue to monitor.  

## 2019-11-04 LAB — CBC WITH DIFFERENTIAL/PLATELET
Abs Immature Granulocytes: 0 10*3/uL (ref 0.00–0.07)
Basophils Absolute: 0 10*3/uL (ref 0.0–0.1)
Basophils Relative: 0 %
Eosinophils Absolute: 0 10*3/uL (ref 0.0–0.5)
Eosinophils Relative: 0 %
HCT: 36 % (ref 36.0–46.0)
Hemoglobin: 12 g/dL (ref 12.0–15.0)
Lymphocytes Relative: 12 %
Lymphs Abs: 1.2 10*3/uL (ref 0.7–4.0)
MCH: 24.7 pg — ABNORMAL LOW (ref 26.0–34.0)
MCHC: 33.3 g/dL (ref 30.0–36.0)
MCV: 74.1 fL — ABNORMAL LOW (ref 80.0–100.0)
Monocytes Absolute: 0.8 10*3/uL (ref 0.1–1.0)
Monocytes Relative: 8 %
Neutro Abs: 7.7 10*3/uL (ref 1.7–7.7)
Neutrophils Relative %: 80 %
Platelets: 168 10*3/uL (ref 150–400)
RBC: 4.86 MIL/uL (ref 3.87–5.11)
RDW: 13.5 % (ref 11.5–15.5)
WBC: 9.6 10*3/uL (ref 4.0–10.5)
nRBC: 0.3 % — ABNORMAL HIGH (ref 0.0–0.2)

## 2019-11-04 LAB — COMPREHENSIVE METABOLIC PANEL
ALT: 31 U/L (ref 0–44)
AST: 52 U/L — ABNORMAL HIGH (ref 15–41)
Albumin: 3 g/dL — ABNORMAL LOW (ref 3.5–5.0)
Alkaline Phosphatase: 79 U/L (ref 38–126)
Anion gap: 6 (ref 5–15)
BUN: 65 mg/dL — ABNORMAL HIGH (ref 8–23)
CO2: 20 mmol/L — ABNORMAL LOW (ref 22–32)
Calcium: 8.9 mg/dL (ref 8.9–10.3)
Chloride: 112 mmol/L — ABNORMAL HIGH (ref 98–111)
Creatinine, Ser: 1.94 mg/dL — ABNORMAL HIGH (ref 0.44–1.00)
GFR calc Af Amer: 28 mL/min — ABNORMAL LOW (ref >60)
GFR calc non Af Amer: 24 mL/min — ABNORMAL LOW (ref >60)
Glucose, Bld: 224 mg/dL — ABNORMAL HIGH (ref 70–99)
Potassium: 5.4 mmol/L — ABNORMAL HIGH (ref 3.5–5.1)
Sodium: 138 mmol/L (ref 135–145)
Total Bilirubin: 0.5 mg/dL (ref 0.3–1.2)
Total Protein: 7.7 g/dL (ref 6.5–8.1)

## 2019-11-04 LAB — C-REACTIVE PROTEIN: CRP: 2.3 mg/dL — ABNORMAL HIGH (ref <1.0)

## 2019-11-04 LAB — CBG MONITORING, ED
Glucose-Capillary: 198 mg/dL — ABNORMAL HIGH (ref 70–99)
Glucose-Capillary: 206 mg/dL — ABNORMAL HIGH (ref 70–99)
Glucose-Capillary: 359 mg/dL — ABNORMAL HIGH (ref 70–99)

## 2019-11-04 LAB — MAGNESIUM: Magnesium: 2.3 mg/dL (ref 1.7–2.4)

## 2019-11-04 LAB — GLUCOSE, CAPILLARY
Glucose-Capillary: 73 mg/dL (ref 70–99)
Glucose-Capillary: 216 mg/dL — ABNORMAL HIGH (ref 70–99)

## 2019-11-04 LAB — PHOSPHORUS: Phosphorus: 3.3 mg/dL (ref 2.5–4.6)

## 2019-11-04 LAB — FERRITIN: Ferritin: 800 ng/mL — ABNORMAL HIGH (ref 11–307)

## 2019-11-04 LAB — D-DIMER, QUANTITATIVE: D-Dimer, Quant: 1.16 ug/mL-FEU — ABNORMAL HIGH (ref 0.00–0.50)

## 2019-11-04 MED ORDER — INSULIN ASPART 100 UNIT/ML ~~LOC~~ SOLN
6.0000 [IU] | Freq: Three times a day (TID) | SUBCUTANEOUS | Status: DC
  Administered 2019-11-05 – 2019-11-06 (×5): 6 [IU] via SUBCUTANEOUS

## 2019-11-04 MED ORDER — INSULIN GLARGINE 100 UNIT/ML ~~LOC~~ SOLN
15.0000 [IU] | Freq: Every day | SUBCUTANEOUS | Status: DC
  Administered 2019-11-04 – 2019-11-05 (×2): 15 [IU] via SUBCUTANEOUS
  Filled 2019-11-04 (×2): qty 0.15

## 2019-11-04 MED ORDER — SODIUM ZIRCONIUM CYCLOSILICATE 5 G PO PACK
5.0000 g | PACK | Freq: Once | ORAL | Status: AC
  Administered 2019-11-04: 5 g via ORAL
  Filled 2019-11-04: qty 1

## 2019-11-04 NOTE — Progress Notes (Signed)
Patient ID: Brittany Roberts, female   DOB: 08/31/1940, 79 y.o.   MRN: [id number]  PROGRESS NOTE    Brittany Roberts  NLZ:[id number] DOB: 1940-09-15 DOA: 11/01/2019 PCP: [name], [name], MD    Brief Narrative:  Brittany Roberts a 79 y.o.femalewith past medical history of bipolar disorder, congestive heart failure, coronary artery disease, depression, fibromyalgia, dementia, GERD, hypothyroidism, hypertension,diabetes mellitus, chronic kidney disease presented to the hospital with complaints of fatigue,weakness and shortness of breath. Patient does have underlying dementia but has been getting little worse over the last few days. Patient denies any chest pain, palpitation or productive cough. Patient states that her granddaughter had recently visited after which she started having some dry cough,  throat irritation and dyspnea which is getting worse over the last few days. She complainsmostly of congestion in her chest. Patient denies any dizziness, nausea, vomiting. Denies diarrhea headache, urinary urgency frequency or dysuria. She complains of a decreased appetite.  ED Course:In the ED, patient noted to be hypoxic and required 2 L of nasal cannula oxygen. BUN was 40 with a creatinine of 2.2.Urine culture blood cultures were sent to the ED. Chest x-ray showed some congestive findings. COVID 19-Was positive.Patient was then considered for admission to the hospital.   Assessment & Plan:   Principal Problem:   Pneumonia due to COVID-19 virus Active Problems:   Hypothyroidism   Hypercholesteremia   Morbid obesity ([place])   COPD (chronic obstructive pulmonary disease) ([place])   GERD   Controlled type 2 diabetes with neuropathy ([place])   Respiratory failure ([place])  Pneumonia secondary to COVID-19 with acute hypoxic respiratory failure on presentation. Continue remdesivir IV dexamethasone IV. Currently on 2 L of nasal cannula oxygen.Will moniterinflammatory biomarkers - trending down.  Continuebronchodilators,oxygen.  Temp max of 102.51F--afebrile last 24 hours. Patient had pulse ox of 87% on RA initially.Chest x-ray shows evidence of vascular congestion. Lactate was 1.0.  procalcitonin 0.42 . No leukocytosis.  History of COPD. Continue oxygen,bronchodilators.IV steroids. Mild rhonchi noted. Not on oxygen at home.  GERD. Continue PPI.  Type 2 diabetes mellitus with neuropathy. We will put patient on sliding scale insulin,meal time insulin,Accu-Cheks, diabetic diet. On Humulin at home. We will add Lantus at bedtime.  History of hypothyroidism.In the records. Did not see Synthroid. Check TSH.  Essential hypertension.Continue amlodipine,metoprolol.Will monitor blood pressure.  Hyperlipidemia. Continue Lipitor.  Mild acute kidney injury on chronic kidney disease likely stage IIIa.Creatinine of 1.4 oneyear back. Creatinine on admission 2.2-->1.94 today.Hold Lasix for now. Will decrease the dose of gabapentinfor renal dysfunction. Hold Lasix. Avoid IV fluids today. Check BMP in a.m. Urineanalysis showed proteinuria.  Hyperkalemia: Lokelma 5g given x 2 (again today), recheck labs in am.  Dementia.Continue memantine.  Debility. Has a walker at home. Lives alone. Has Aide coming to the house.  Mild thrombocytopenia.We will close monitor. Check CBC in a.m.  DVT prophylaxis: Heparin SQ Code Status: DNR  Family Communication: Daughter Disposition Plan: PT to eval, TOC consult done--per daughter, she would like a longer term placement and SNF if possible  Consultants:   None  Procedures:  None  Antimicrobials: Anti-infectives (From admission, onward)   Start     Dose/Rate Route Frequency Ordered Stop   11/02/19 1000  remdesivir 100 mg in sodium chloride 0.9 % 100 mL IVPB     100 mg 200 mL/hr over 30 Minutes Intravenous Daily 11/01/19 1312 11/06/19 0959   11/01/19 1330  remdesivir 100 mg in sodium chloride 0.9 % 100  mL IVPB  100 mg 200 mL/hr over 30 Minutes Intravenous Every 1 hr x 2 11/01/19 1312 11/01/19 1548   11/01/19 1200  levofloxacin (LEVAQUIN) IVPB 750 mg     750 mg 100 mL/hr over 90 Minutes Intravenous  Once 11/01/19 1151 11/01/19 1425     Subjective: Lying in bed, appears pleasant. + cough. Breathing is somewhat labored and she is on 2 L Brittany Roberts  Objective: Vitals:   11/04/19 1024 11/04/19 1100 11/04/19 1300 11/04/19 1548  BP: 132/68 (!) 141/79 134/81 (!) 132/93  Pulse:  (!) 51 (!) 51 (!) 56  Resp:  18  16  Temp:    98 F (36.7 C)  TempSrc:    Oral  SpO2:  94% 94% 95%  Weight:    127.7 kg  Height:    5' 3.5" (1.613 m)    Intake/Output Summary (Last 24 hours) at 11/04/2019 1631 Last data filed at 11/04/2019 1046 Gross per 24 hour  Intake 2145.54 ml  Output --  Net 2145.54 ml   Filed Weights   11/01/19 1100 11/04/19 1548  Weight: 130.6 kg 127.7 kg   Examination:  General exam: Appears calm and comfortable  Respiratory system: increased WOB, diffuse wheezes Cardiovascular system: S1 & S2 heard, RRR.  Gastrointestinal system: Abdomen is nondistended, soft and nontender.  Central nervous system: Alert and somewhat confused Extremities: Symmetric  Skin: No rashes  Data Reviewed: I have personally reviewed following labs and imaging studies  CBC: Recent Labs  Lab 11/01/19 1120 11/02/19 0627 11/03/19 0500 11/04/19 0500  WBC 6.9 3.7* 6.7 9.6  NEUTROABS 4.1 2.0 4.8 7.7  HGB 12.6 13.2 12.4 12.0  HCT 36.8 41.0 37.4 36.0  MCV 75.3* 76.4* 74.8* 74.1*  PLT 108* 114* 142* 629   Basic Metabolic Panel: Recent Labs  Lab 11/01/19 1120 11/01/19 1120 11/02/19 0627 11/02/19 1130 11/03/19 0500 11/03/19 0830 11/04/19 0500  NA 137  --  136  --  136  --  138  K 4.9   < > 6.0* 5.3* 5.8* 5.2* 5.4*  CL 107  --  107  --  110  --  112*  CO2 22  --  21*  --  20*  --  20*  GLUCOSE 95  --  213*  --  232*  --  224*  BUN 51*  --  57*  --  60*  --  65*  CREATININE 2.27*  --  2.04*  --   1.97*  --  1.94*  CALCIUM 8.5*  --  8.4*  --  8.6*  --  8.9  MG  --   --  2.2  --  2.1  --  2.3  PHOS  --   --   --   --   --   --  3.3   < > = values in this interval not displayed.   GFR: Estimated Creatinine Clearance: 30.4 mL/min (A) (by C-G formula based on SCr of 1.94 mg/dL (H)). Liver Function Tests: Recent Labs  Lab 11/01/19 1120 11/02/19 0627 11/03/19 0500 11/04/19 0500  AST 52* 60* 59* 52*  ALT 29 32 33 31  ALKPHOS 79 78 75 79  BILITOT 0.5 0.6 0.7 0.5  PROT 7.9 8.1 7.5 7.7  ALBUMIN 3.2* 3.0* 2.8* 3.0*   Coagulation Profile: Recent Labs  Lab 11/01/19 1120  INR 1.0   CBG: Recent Labs  Lab 11/03/19 1656 11/03/19 2250 11/04/19 0621 11/04/19 0844 11/04/19 1128  GLUCAP 164* 205* 198* 206* 359*   Thyroid Function Tests:  Recent Labs    11/02/19 0626  TSH 0.370   Anemia Panel: Recent Labs    11/03/19 0500 11/04/19 0500  FERRITIN 947* 800*   Sepsis Labs: Recent Labs  Lab 11/01/19 1120  PROCALCITON 0.42  LATICACIDVEN 1.0    Recent Results (from the past 240 hour(s))  Blood Culture (routine x 2)     Status: None (Preliminary result)   Collection Time: 11/01/19 11:20 AM   Specimen: BLOOD  Result Value Ref Range Status   Specimen Description   Final    BLOOD LEFT WRIST Performed at [facility] [address] [place], [place] [address]    Special Requests   Final    BOTTLES DRAWN AEROBIC AND ANAEROBIC Blood Culture results may not be optimal due to an inadequate volume of blood received in culture bottles Performed at [facility] [address], [place], [place] [address]    Culture   Final    NO GROWTH 3 DAYS Performed at [facility], [place] [address] [place], [place] [address]    Report Status PENDING  Incomplete  Urine culture     Status: None   Collection Time: 11/01/19 12:18 PM   Specimen: In/Out Cath Urine  Result Value Ref Range Status   Specimen Description   Final    IN/OUT CATH  URINE Performed at [facility] [address] [place], [place] [address]    Special Requests   Final    NONE Performed at [facility], [place] [address], [place], [place] [address]    Culture   Final    NO GROWTH Performed at [facility], [place] [address], [place], [place] [address]    Report Status 11/02/2019 FINAL  Final    Scheduled Meds: . allopurinol  100 mg Oral Daily  . amLODipine  5 mg Oral Daily  . aspirin EC  81 mg Oral Daily  . atorvastatin  20 mg Oral Daily  . dexamethasone (DECADRON) injection  6 mg Intravenous Q24H  . diclofenac Sodium  4 g Topical QID  . DULoxetine  60 mg Oral Daily  . fluticasone furoate-vilanterol  1 puff Inhalation Daily  . gabapentin  100 mg Oral BID  . guaiFENesin  600 mg Oral BID  . heparin  5,000 Units Subcutaneous Q8H  . insulin aspart  0-15 Units Subcutaneous TID WC  . insulin aspart  0-5 Units Subcutaneous QHS  . insulin aspart  4 Units Subcutaneous TID WC  . insulin glargine  10 Units Subcutaneous QHS  . isosorbide mononitrate  30 mg Oral Daily  . levETIRAcetam  500 mg Oral BID  . linaclotide  145 mcg Oral QAC breakfast  . memantine  10 mg Oral BID  . metoprolol succinate  50 mg Oral Daily  . montelukast  10 mg Oral QHS  . pantoprazole  40 mg Oral Daily   Continuous Infusions: . remdesivir 100 mg in NS 100 mL Stopped (11/04/19 1046)    LOS: 3 days   Donnamae Jude, MD 11/04/2019 4:31 PM [phone] [facility] If 7PM-7AM, please contact night-coverage 11/04/2019, 4:31 PM

## 2019-11-04 NOTE — ED Notes (Signed)
PT provided meal tray, PT repositioned, PT placed in fowlers for lunch.

## 2019-11-04 NOTE — Progress Notes (Signed)
OT checked on pt this day. Pt falling asleep mid sentence stating she just wanted to rest.  OT Aed pt readjust in bed and explained benefits of therapy. Pt ask OT to return next day Kari Baars, [place] [phone] [phone]

## 2019-11-04 NOTE — ED Notes (Signed)
Patient denies BM. Purewick actively in place and functioning appropriately

## 2019-11-04 NOTE — ED Notes (Signed)
Patient has removed her Oxygen and is satting at 96% on RA. RN monitored patient's saturation for 15 minutes and patient was maintaining. Will continue to monitor but will leave patient off O2 at this time.

## 2019-11-04 NOTE — ED Notes (Signed)
PT provided breakfast tray. PT placed in fowlers position for eating, PT stated she was comfortable.

## 2019-11-05 ENCOUNTER — Inpatient Hospital Stay (HOSPITAL_COMMUNITY): Payer: Medicare Other

## 2019-11-05 DIAGNOSIS — J1282 Pneumonia due to Coronavirus disease 2019: Secondary | ICD-10-CM | POA: Diagnosis not present

## 2019-11-05 DIAGNOSIS — U071 COVID-19: Secondary | ICD-10-CM | POA: Diagnosis not present

## 2019-11-05 LAB — COMPREHENSIVE METABOLIC PANEL
ALT: 38 U/L (ref 0–44)
AST: 53 U/L — ABNORMAL HIGH (ref 15–41)
Albumin: 3.2 g/dL — ABNORMAL LOW (ref 3.5–5.0)
Alkaline Phosphatase: 77 U/L (ref 38–126)
Anion gap: 8 (ref 5–15)
BUN: 63 mg/dL — ABNORMAL HIGH (ref 8–23)
CO2: 20 mmol/L — ABNORMAL LOW (ref 22–32)
Calcium: 9 mg/dL (ref 8.9–10.3)
Chloride: 107 mmol/L (ref 98–111)
Creatinine, Ser: 1.61 mg/dL — ABNORMAL HIGH (ref 0.44–1.00)
GFR calc Af Amer: 35 mL/min — ABNORMAL LOW (ref >60)
GFR calc non Af Amer: 30 mL/min — ABNORMAL LOW (ref >60)
Glucose, Bld: 237 mg/dL — ABNORMAL HIGH (ref 70–99)
Potassium: 5 mmol/L (ref 3.5–5.1)
Sodium: 135 mmol/L (ref 135–145)
Total Bilirubin: 0.7 mg/dL (ref 0.3–1.2)
Total Protein: 8 g/dL (ref 6.5–8.1)

## 2019-11-05 LAB — CBC WITH DIFFERENTIAL/PLATELET
Abs Immature Granulocytes: 0.03 10*3/uL (ref 0.00–0.07)
Basophils Absolute: 0 10*3/uL (ref 0.0–0.1)
Basophils Relative: 0 %
Eosinophils Absolute: 0 10*3/uL (ref 0.0–0.5)
Eosinophils Relative: 0 %
HCT: 37.6 % (ref 36.0–46.0)
Hemoglobin: 12.7 g/dL (ref 12.0–15.0)
Immature Granulocytes: 0 %
Lymphocytes Relative: 19 %
Lymphs Abs: 1.7 10*3/uL (ref 0.7–4.0)
MCH: 25 pg — ABNORMAL LOW (ref 26.0–34.0)
MCHC: 33.8 g/dL (ref 30.0–36.0)
MCV: 73.9 fL — ABNORMAL LOW (ref 80.0–100.0)
Monocytes Absolute: 0.6 10*3/uL (ref 0.1–1.0)
Monocytes Relative: 6 %
Neutro Abs: 6.7 10*3/uL (ref 1.7–7.7)
Neutrophils Relative %: 75 %
Platelets: 170 10*3/uL (ref 150–400)
RBC: 5.09 MIL/uL (ref 3.87–5.11)
RDW: 13.5 % (ref 11.5–15.5)
WBC: 9 10*3/uL (ref 4.0–10.5)
nRBC: 0.4 % — ABNORMAL HIGH (ref 0.0–0.2)

## 2019-11-05 LAB — GLUCOSE, CAPILLARY
Glucose-Capillary: 209 mg/dL — ABNORMAL HIGH (ref 70–99)
Glucose-Capillary: 150 mg/dL — ABNORMAL HIGH (ref 70–99)
Glucose-Capillary: 208 mg/dL — ABNORMAL HIGH (ref 70–99)
Glucose-Capillary: 343 mg/dL — ABNORMAL HIGH (ref 70–99)
Glucose-Capillary: 284 mg/dL — ABNORMAL HIGH (ref 70–99)

## 2019-11-05 LAB — D-DIMER, QUANTITATIVE: D-Dimer, Quant: 0.96 ug/mL-FEU — ABNORMAL HIGH (ref 0.00–0.50)

## 2019-11-05 LAB — MAGNESIUM: Magnesium: 2.3 mg/dL (ref 1.7–2.4)

## 2019-11-05 LAB — C-REACTIVE PROTEIN: CRP: 1.4 mg/dL — ABNORMAL HIGH (ref <1.0)

## 2019-11-05 LAB — FERRITIN: Ferritin: 742 ng/mL — ABNORMAL HIGH (ref 11–307)

## 2019-11-05 MED ORDER — ENOXAPARIN SODIUM 60 MG/0.6ML ~~LOC~~ SOLN
60.0000 mg | SUBCUTANEOUS | Status: DC
  Administered 2019-11-05 – 2019-11-07 (×3): 60 mg via SUBCUTANEOUS
  Filled 2019-11-05 (×3): qty 0.6

## 2019-11-05 MED ORDER — METHYLPREDNISOLONE SODIUM SUCC 125 MG IJ SOLR
60.0000 mg | Freq: Two times a day (BID) | INTRAMUSCULAR | Status: DC
  Administered 2019-11-05 – 2019-11-07 (×5): 60 mg via INTRAVENOUS
  Filled 2019-11-05 (×5): qty 2

## 2019-11-05 NOTE — Progress Notes (Signed)
Unable to utilize CPAP QHS due to ++COVID

## 2019-11-05 NOTE — Progress Notes (Signed)
Bed requests sent to [facility], [place], [place], [place].

## 2019-11-05 NOTE — Plan of Care (Signed)
Talked with daughter Lorenso Courier to update on current POC and patient status.  No further questions at this time.

## 2019-11-05 NOTE — Plan of Care (Signed)

## 2019-11-05 NOTE — Progress Notes (Signed)
OT Cancellation Note  Patient Details Name: Brittany Roberts MRN: [id number] DOB: [birth year]   Cancelled Treatment:    Reason Eval/Treat Not Completed: Other (comment)  Noted plans for SNF, pts functional level, as well as bed requests.  Will defer OT eval to SNF Kari Baars, [place] [phone] Office- [phone], Edwena Felty D 11/05/2019, 5:05 PM

## 2019-11-05 NOTE — NC FL2 (Signed)
[place] LEVEL OF CARE SCREENING TOOL     IDENTIFICATION  Patient Name: Brittany Roberts Birthdate: [birth year] Sex: female Admission Date (Current Location): 11/01/2019  [facility] and [place] Number:  Herbalist and Address:  [facility],  [place] [address], [place]      Provider Number: [id number]  Attending Physician Name and Address:  Shelly Coss, MD  Relative Name and Phone Number:  [name] (Daughter) [phone] [facility])    Current Level of Care: Hospital Recommended Level of Care: [place] Prior Approval Number:    Date Approved/Denied:   PASRR Number: [id number]  Discharge Plan: SNF    Current Diagnoses: Patient Active Problem List   Diagnosis Date Noted  . Pneumonia due to COVID-19 virus 11/01/2019  . Respiratory failure ([place]) 11/01/2019  . Memory loss 04/05/2019  . Cerebrovascular accident (CVA) ([place]) 04/05/2019  . Seizures ([place]) 04/05/2019  . Obstructive sleep apnea 04/05/2019  . Chronic heart failure with preserved ejection fraction ([place]) 01/17/2019  . BP (high blood pressure) 01/01/2019  . OP (osteoporosis) 01/01/2019  . Abdominal wall pain in right flank 08/13/2018  . Alopecia, scarring 08/11/2018  . Chronic right shoulder pain 03/15/2018  . Bilateral primary osteoarthritis of knee 02/09/2018  . Pain in right ankle and joints of right foot 02/09/2018  . Acute exacerbation of congestive heart failure ([place]) 12/13/2017  . AKI (acute kidney injury) ([place]) 09/01/2017  . Gout, arthropathy 09/01/2017  . Impacted cerumen of both ears 05/13/2017  . Sensorineural hearing loss, bilateral 05/13/2017  . Localization-related symptomatic epilepsy and epileptic syndromes with complex partial seizures, not intractable, without status epilepticus ([place]) 04/25/2017  . Fibromyalgia 04/08/2017  . Allergic rhinitis 03/11/2017  . Cardiomyopathy ([place]) 01/22/2017  . Hyperlipidemia associated with type 2 diabetes  mellitus ([place]) 01/22/2017  . Bipolar disorder ([place]) 01/22/2017  . Seizure ([place])   . Thoracic aortic atherosclerosis ([place]) 01/14/2017  . Coronary artery calcification seen on CAT scan 01/14/2017  . Acute delirium 01/14/2017  . Possible Tonic-clonic seizure disorder ([place]) 01/14/2017  . CVA (cerebral vascular accident) ([place]) 01/14/2017  . Altered mental status 01/13/2017  . Mastalgia 04/20/2016  . Bilateral leg edema 02/10/2016  . Difficulty hearing 10/30/2015  . Midline low back pain with right-sided sciatica 09/25/2015  . Carpal tunnel syndrome on left 08/14/2015  . Chest pain of uncertain etiology 09/38/1829  . Primary localized osteoarthrosis, hand 06/19/2015  . Radial styloid tenosynovitis 06/19/2015  . Dysarthria 02/14/2015  . Schizophrenia, unspecified type ([place]) 10/25/2014  . Type 2 diabetes mellitus with complication ([place])   . Cephalalgia   . Hx of systemic lupus erythematosus (SLE) ([place])   . Thyroid mass 04/26/2014  . Constipation 12/10/2013  . Benign neoplasm of colon 12/10/2013  . Controlled type 2 diabetes with neuropathy ([place]) 11/27/2013  . Cystic kidney disease 11/09/2013  . Insulin dependent type 2 diabetes mellitus ([place]) 10/29/2013  . Hoarseness or changing voice 09/27/2013  . Dyskinesia, tardive 07/11/2013  . TMJ syndrome 02/16/2013  . Angina pectoris associated with type 2 diabetes mellitus ([place]) 08/08/2012  . Coronary artery disease of native artery of native heart with stable angina pectoris ([place]) 08/08/2012  . Asthma, chronic, PRESUMED wiht MULTIFACTORIAL DYSPNEA 05/30/2012  . Insomnia 09/22/2011  . Mobility poor 02/02/2011  . Chronic kidney disease, stage III (moderate) 05/13/2010  . Chronic combined systolic and diastolic CHF, NYHA class 2 ([place]) 03/31/2010  . COPD (chronic obstructive pulmonary disease) ([place]) 03/26/2010  . Morbid obesity ([place]) 02/27/2010  .  Unstable gait 02/27/2010  . DM (diabetes mellitus), type 2 with neurological complications ([place])  01/60/1093  . Arthritis 04/25/2009  . Anemia 03/11/2007  . COGNITIVE IMPAIRMENT, MILD, SO STATED 03/11/2007  . RESTLESS LEG SYNDROME 03/11/2007  . Lupus ([place]) 03/11/2007  . OSTEOARTHROSIS, GENERALIZED, MULTIPLE SITES 03/11/2007  . Hypothyroidism 03/07/2007  . Hypercholesteremia 03/07/2007  . Depression 03/07/2007  . Hypertensive heart disease with heart failure ([place]) 03/07/2007  . Coronary atherosclerosis 03/07/2007  . GERD 03/07/2007  . SLEEP APNEA 03/07/2007    Orientation RESPIRATION BLADDER Height & Weight     Self, Place  Normal Incontinent, External catheter Weight: 281 lb 11.2 oz (127.8 kg) Height:  5' 3.5" (161.3 cm)  BEHAVIORAL SYMPTOMS/MOOD NEUROLOGICAL BOWEL NUTRITION STATUS  (none) (none) Incontinent Diet(see discharge summary)  AMBULATORY STATUS COMMUNICATION OF NEEDS Skin   Extensive Assist Verbally Normal                       Personal Care Assistance Level of Assistance  Bathing, Feeding, Dressing Bathing Assistance: Limited assistance Feeding assistance: Independent Dressing Assistance: Maximum assistance     Functional Limitations Info  Sight, Speech, Hearing Sight Info: Adequate Hearing Info: Adequate Speech Info: Adequate    SPECIAL CARE FACTORS FREQUENCY  PT (By licensed PT), OT (By licensed OT)     PT Frequency: 5x/week OT Frequency: 5x/week            Contractures Contractures Info: Not present    Additional Factors Info  Code Status, Allergies, Insulin Sliding Scale, Isolation Precautions Code Status Info: DNR Allergies Info: Ace Inhibitors, Isosorb Dinitrate-hydralazine, Penicillins, Buspirone, Pregabalin, Ropinirole, Hydrochloride, Amantadines   Insulin Sliding Scale Info: insulin aspart (novoLOG) injection 0-15 Units, insulin aspart (novoLOG) injection 0-5 Units Isolation Precautions Info: Covid positive     Current Medications (11/05/2019):  This is the current hospital active medication list Current Facility-Administered  Medications  Medication Dose Route Frequency Provider Last Rate Last Admin  . acetaminophen (TYLENOL) tablet 650 mg  650 mg Oral Q6H PRN Pokhrel, Laxman, MD      . albuterol (VENTOLIN HFA) 108 (90 Base) MCG/ACT inhaler 2 puff  2 puff Inhalation Q4H PRN Pokhrel, Laxman, MD      . allopurinol (ZYLOPRIM) tablet 100 mg  100 mg Oral Daily Pokhrel, Laxman, MD   100 mg at 11/05/19 0930  . amLODipine (NORVASC) tablet 5 mg  5 mg Oral Daily Pokhrel, Laxman, MD   5 mg at 11/05/19 0931  . aspirin EC tablet 81 mg  81 mg Oral Daily Pokhrel, Laxman, MD   81 mg at 11/05/19 0930  . atorvastatin (LIPITOR) tablet 20 mg  20 mg Oral Daily Pokhrel, Laxman, MD   20 mg at 11/05/19 0931  . diclofenac Sodium (VOLTAREN) 1 % topical gel 4 g  4 g Topical QID Pokhrel, Laxman, MD   4 g at 11/05/19 0931  . DULoxetine (CYMBALTA) DR capsule 60 mg  60 mg Oral Daily Pokhrel, Laxman, MD   60 mg at 11/05/19 0931  . fluticasone furoate-vilanterol (BREO ELLIPTA) 100-25 MCG/INH 1 puff  1 puff Inhalation Daily Pokhrel, Laxman, MD      . gabapentin (NEURONTIN) capsule 100 mg  100 mg Oral BID Pokhrel, Laxman, MD   100 mg at 11/05/19 0936  . guaiFENesin (MUCINEX) 12 hr tablet 600 mg  600 mg Oral BID Pokhrel, Laxman, MD   600 mg at 11/05/19 0931  . heparin injection 5,000 Units  5,000 Units Subcutaneous Q8H Pokhrel, Laxman, MD  5,000 Units at 11/05/19 0540  . insulin aspart (novoLOG) injection 0-15 Units  0-15 Units Subcutaneous TID WC Pokhrel, Laxman, MD   5 Units at 11/05/19 0957  . insulin aspart (novoLOG) injection 0-5 Units  0-5 Units Subcutaneous QHS Flora Lipps, MD   2 Units at 11/04/19 2208  . insulin aspart (novoLOG) injection 6 Units  6 Units Subcutaneous TID WC Donnamae Jude, MD   6 Units at 11/05/19 [phone]  . insulin glargine (LANTUS) injection 15 Units  15 Units Subcutaneous QHS Donnamae Jude, MD   15 Units at 11/04/19 2208  . isosorbide mononitrate (IMDUR) 24 hr tablet 30 mg  30 mg Oral Daily Pokhrel, Laxman, MD   30 mg at  11/05/19 0931  . levETIRAcetam (KEPPRA) tablet 500 mg  500 mg Oral BID Pokhrel, Laxman, MD   500 mg at 11/05/19 0930  . linaclotide (LINZESS) capsule 145 mcg  145 mcg Oral QAC breakfast Pokhrel, Laxman, MD   145 mcg at 11/05/19 0930  . memantine (NAMENDA) tablet 10 mg  10 mg Oral BID Pokhrel, Laxman, MD   10 mg at 11/05/19 0931  . methylPREDNISolone sodium succinate (SOLU-MEDROL) 125 mg/2 mL injection 60 mg  60 mg Intravenous Q12H Adhikari, Amrit, MD      . metoprolol succinate (TOPROL-XL) 24 hr tablet 50 mg  50 mg Oral Daily Pokhrel, Laxman, MD   50 mg at 11/05/19 0936  . montelukast (SINGULAIR) tablet 10 mg  10 mg Oral QHS Pokhrel, Laxman, MD   10 mg at 11/04/19 2209  . ondansetron (ZOFRAN) tablet 4 mg  4 mg Oral Q6H PRN Pokhrel, Laxman, MD       Or  . ondansetron (ZOFRAN) injection 4 mg  4 mg Intravenous Q6H PRN Pokhrel, Laxman, MD      . pantoprazole (PROTONIX) EC tablet 40 mg  40 mg Oral Daily Pokhrel, Laxman, MD   40 mg at 11/05/19 0930  . polyethylene glycol (MIRALAX / GLYCOLAX) packet 17 g  17 g Oral Daily PRN Pokhrel, Laxman, MD         Discharge Medications: Please see discharge summary for a list of discharge medications.  Relevant Imaging Results:  Relevant Lab Results:   Additional Information [id number]  Bethann Berkshire, LCSW

## 2019-11-05 NOTE — Progress Notes (Signed)
Physical Therapy Treatment Patient Details Name: Brittany Roberts MRN: [id number] DOB: [birth year] Today's Date: 11/05/2019    History of Present Illness Pt admitted with SOB 2 COVID PNA; Pt with hx of DM, Lupus, Peripheral neuropathy, CAD, CHF, COPD, , Bipolar, and memory loss    PT Comments    Pt in bed incont urine and BM.  Assisted OOB to amb to bathroom.  General Comments: Hx dementia but follows functional commands, pleasant.  General bed mobility comments: increased time with repeat functional VC's and assist with upper body and scooting to EOB. General transfer comment: Min Assist fro elevated bed as pt leans far forward to shift weight anteriorly then stand.  Required increased assist off lower toilet level.General Gait Details: VC's to amb to bathroom required increased time and use of walker to steady self.  Limited activity tolerance with 3/4 dyspnea and MAX c/o fatigie.  Unable to monitor HR or O2 sats due to telemetry off but leads still on pt and no available dynamate on unit.  Assisted with a wash up ans then amb to recliner.  Pt required an extended rest break due to signs of fatigue and dyspnea.   Pt lives home alone in an apartment and will need ST Rehab at SNF prior to safely returning.  Pt's mental status is slow to improve.  Present with expressive aphasia/word finding and slow/delayed movements.  Follow Up Recommendations  SNF     Equipment Recommendations  None recommended by PT    Recommendations for Other Services       Precautions / Restrictions Precautions Precautions: Fall Restrictions Weight Bearing Restrictions: No    Mobility  Bed Mobility Overal bed mobility: Needs Assistance Bed Mobility: Supine to Sit     Supine to sit: Min assist;Mod assist     General bed mobility comments: increased time with repeat functional VC's and assist with upper body and scooting to EOB.  Transfers Overall transfer level: Needs assistance Equipment used: Rolling walker  (2 wheeled);None Transfers: Sit to/from [employer] to Stand: Min assist;Mod assist Stand pivot transfers: Min assist;Mod assist       General transfer comment: Min Assist fro elevated bed as pt leans far forward to shift weight anteriorly then stand.  Required increased assist off lower toilet level.  Ambulation/Gait Ambulation/Gait assistance: Min assist Gait Distance (Feet): 20 Feet(10 feet x 2 to and from bathroom) Assistive device: Rolling walker (2 wheeled) Gait Pattern/deviations: Step-to pattern;Step-through pattern;Decreased step length - right;Decreased step length - left;Shuffle;Trunk flexed Gait velocity: decreased   General Gait Details: VC's to amb to bathroom required increased time and use of walker to steady self.  Limited activity tolerance with 3/4 dyspnea and MAX c/o fatigie.  Unable to monitor HR or O2 sats due to telemetry off but leads still on pt and no available dynamate on unit.   Stairs             Wheelchair Mobility    Modified Rankin (Stroke Patients Only)       Balance                                            Cognition Arousal/Alertness: Awake/alert Behavior During Therapy: WFL for tasks assessed/performed Overall Cognitive Status: History of cognitive impairments - at baseline  General Comments: Hx dementia but follows functional commands, pleasant      Exercises      General Comments        Pertinent Vitals/Pain Pain Assessment: No/denies pain    Home Living                      Prior Function            PT Goals (current goals can now be found in the care plan section)      Frequency    Min 3X/week      PT Plan Current plan remains appropriate    Co-evaluation              AM-PAC PT "6 Clicks" Mobility   Outcome Measure  Help needed turning from your back to your side while in a flat bed without using  bedrails?: A Lot Help needed moving from lying on your back to sitting on the side of a flat bed without using bedrails?: A Lot Help needed moving to and from a bed to a chair (including a wheelchair)?: A Lot Help needed standing up from a chair using your arms (e.g., wheelchair or bedside chair)?: A Lot Help needed to walk in hospital room?: A Lot Help needed climbing 3-5 steps with a railing? : A Lot 6 Click Score: 12    End of Session Equipment Utilized During Treatment: Gait belt Activity Tolerance: Patient limited by fatigue;Other (comment)(3/4 dyspnea) Patient left: in chair;with call bell/phone within reach Nurse Communication: Mobility status PT Visit Diagnosis: Difficulty in walking, not elsewhere classified (R26.2);Muscle weakness (generalized) (M62.81)     Time: 1610-9604 PT Time Calculation (min) (ACUTE ONLY): 25 min  Charges:  $Gait Training: 8-22 mins $Therapeutic Activity: 8-22 mins                     {Yukiko Minnich  PTA Acute  Rehabilitation Services Pager      [phone] Office      [phone]

## 2019-11-05 NOTE — Progress Notes (Signed)
PROGRESS NOTE    Brittany Roberts  YQI:[id number] DOB: 01/18/1941 DOA: 11/01/2019 PCP: [name], [name], MD   Brief Narrative:  Patient is a 79 y.o.femalewith past medical history of bipolar disorder, congestive heart failure, coronary artery disease, depression, fibromyalgia, dementia, GERD, hypothyroidism, hypertension,diabetes mellitus, chronic kidney disease presented to the hospital with complaints of fatigue,weakness and shortness of breath. Patient does have underlying dementia but has been getting little worse over the last few days.as per the report, her granddaughter had recently visited after which she started having some dry cough, throat irritation and dyspnea .She complainedmostly of congestion in her chest. In the ED, patient noted to be hypoxic and required 2 L of nasal cannula oxygen. BUN was 40 with a creatinine of 2.2.Chest x-ray showed some congestive findings. COVID 19-Was positive.Patient was started on remdesivir and Decadron.  Hospital course remarkable for significant wheezing.  Currently on room air.  PT/OT recommended skilled nursing  facility.    Assessment & Plan:   Principal Problem:   Pneumonia due to COVID-19 virus Active Problems:   Hypothyroidism   Hypercholesteremia   Morbid obesity ([place])   COPD (chronic obstructive pulmonary disease) ([place])   GERD   Controlled type 2 diabetes with neuropathy ([place])   Respiratory failure ([place])  Pneumonia secondary to COVID-19 with acute hypoxic respiratory failure :She was started on   remdesivir IV, dexamethasone IV. She completed the course of remdesivir.  Currently she is maintaining her saturation on room air but is severely wheezing so we increased the dose of steroids , currently on Solu-Medrol. Inflammatory biomarkers - trending down. Chest x-ray showed evidence of vascular congestion. Lactate was 1.0. procalcitonin 0.42 . No leukocytosis.  History of COPD. Continue oxygen as  needed,bronchodilators.IV steroids.  Not on oxygen at home. Noted to have severe wheezing this morning.  GERD. Continue PPI.  Type 2 diabetes mellitus with neuropathy. On  sliding scale insulin,Lantus at bedtime.  Monitor CBGs  Essential hypertension.Continue amlodipine,metoprolol.Monitor blood pressure.  Hyperlipidemia. Continue Lipitor.  Mild acute kidney injury on chronic kidney disease likely stage IIIa.Creatinine of 1.4 oneyear back. Creatinine of 1.6 today. Will decrease the dose of gabapentinfor renal dysfunction. Hold Lasix.   Hyperkalemia:Lokelma 5g given x 2  Dementia.Continue memantine.  Debility. Has a walker at home. Lives alone. Has Aide coming to the house.PT recommending SnF  Mild thrombocytopenia.Stable  Sleep apnea: uses oxygen at night. Uses cpap.        DVT prophylaxis: Heparin [place] Code Status: DNR Family Communication: Called daughter on phone for update on 11/05/19 Status is: Inpatient  Remains inpatient appropriate because:IV treatments appropriate due to intensity of illness or inability to take PO   Dispo: The patient is from: Home              Anticipated d/c is to: SNF              Anticipated d/c date is: 2 days              Patient currently is not medically stable to d/c.   Consultants: None  Procedures:None  Antimicrobials:  Anti-infectives (From admission, onward)   Start     Dose/Rate Route Frequency Ordered Stop   11/02/19 1000  remdesivir 100 mg in sodium chloride 0.9 % 100 mL IVPB     100 mg 200 mL/hr over 30 Minutes Intravenous Daily 11/01/19 1312 11/05/19 1014   11/01/19 1330  remdesivir 100 mg in sodium chloride 0.9 % 100 mL IVPB  100 mg 200 mL/hr over 30 Minutes Intravenous Every 1 hr x 2 11/01/19 1312 11/01/19 1548   11/01/19 1200  levofloxacin (LEVAQUIN) IVPB 750 mg     750 mg 100 mL/hr over 90 Minutes Intravenous  Once 11/01/19 1151 11/01/19 1425      Subjective: Patient seen and  examined at the bedside this morning.  Lying on the bed, confused.  Denies any complaints.  Wheezing is heard.   Objective: Vitals:   11/05/19 0441 11/05/19 0500 11/05/19 0548 11/05/19 0936  BP:   (!) 148/73 (!) 144/80  Pulse:   (!) 58 62  Resp:  (!) 21 18   Temp:   97.7 F (36.5 C)   TempSrc:   Oral   SpO2:   93%   Weight: 127.8 kg     Height:        Intake/Output Summary (Last 24 hours) at 11/05/2019 1118 Last data filed at 11/05/2019 4098 Gross per 24 hour  Intake 360 ml  Output 1100 ml  Net -740 ml   Filed Weights   11/01/19 1100 11/04/19 1548 11/05/19 0441  Weight: 130.6 kg 127.7 kg 127.8 kg    Examination:  General exam: Not in distress, morbidly obese HEENT:PERRL,Oral mucosa moist, Ear/Nose normal on gross exam Respiratory system: Bilateral expiratory wheezes  cardiovascular system: S1 & S2 heard, RRR. No JVD, murmurs, rubs, gallops or clicks. No pedal edema. Gastrointestinal system: Abdomen is nondistended, soft and nontender. No organomegaly or masses felt. Normal bowel sounds heard. Central nervous system: Alert and awake but not  oriented. No focal neurological deficits. Extremities: No edema, no clubbing ,no cyanosis, distal peripheral pulses palpable. Skin: No rashes, lesions or ulcers,no icterus ,no pallor     Data Reviewed: I have personally reviewed following labs and imaging studies  CBC: Recent Labs  Lab 11/01/19 1120 11/02/19 0627 11/03/19 0500 11/04/19 0500 11/05/19 0531  WBC 6.9 3.7* 6.7 9.6 9.0  NEUTROABS 4.1 2.0 4.8 7.7 6.7  HGB 12.6 13.2 12.4 12.0 12.7  HCT 36.8 41.0 37.4 36.0 37.6  MCV 75.3* 76.4* 74.8* 74.1* 73.9*  PLT 108* 114* 142* 168 119   Basic Metabolic Panel: Recent Labs  Lab 11/01/19 1120 11/01/19 1120 11/02/19 0627 11/02/19 0627 11/02/19 1130 11/03/19 0500 11/03/19 0830 11/04/19 0500 11/05/19 0531  NA 137  --  136  --   --  136  --  138 135  K 4.9   < > 6.0*   < > 5.3* 5.8* 5.2* 5.4* 5.0  CL 107  --  107  --   --   110  --  112* 107  CO2 22  --  21*  --   --  20*  --  20* 20*  GLUCOSE 95  --  213*  --   --  232*  --  224* 237*  BUN 51*  --  57*  --   --  60*  --  65* 63*  CREATININE 2.27*  --  2.04*  --   --  1.97*  --  1.94* 1.61*  CALCIUM 8.5*  --  8.4*  --   --  8.6*  --  8.9 9.0  MG  --   --  2.2  --   --  2.1  --  2.3 2.3  PHOS  --   --   --   --   --   --   --  3.3  --    < > = values in this interval  not displayed.   GFR: Estimated Creatinine Clearance: 36.6 mL/min (A) (by C-G formula based on SCr of 1.61 mg/dL (H)). Liver Function Tests: Recent Labs  Lab 11/01/19 1120 11/02/19 0627 11/03/19 0500 11/04/19 0500 11/05/19 0531  AST 52* 60* 59* 52* 53*  ALT 29 32 33 31 38  ALKPHOS 79 78 75 79 77  BILITOT 0.5 0.6 0.7 0.5 0.7  PROT 7.9 8.1 7.5 7.7 8.0  ALBUMIN 3.2* 3.0* 2.8* 3.0* 3.2*   No results for input(s): LIPASE, AMYLASE in the last 168 hours. No results for input(s): AMMONIA in the last 168 hours. Coagulation Profile: Recent Labs  Lab 11/01/19 1120  INR 1.0   Cardiac Enzymes: No results for input(s): CKTOTAL, CKMB, CKMBINDEX, TROPONINI in the last 168 hours. BNP (last 3 results) No results for input(s): PROBNP in the last 8760 hours. HbA1C: No results for input(s): HGBA1C in the last 72 hours. CBG: Recent Labs  Lab 11/04/19 0844 11/04/19 1128 11/04/19 1701 11/04/19 2107 11/05/19 0928  GLUCAP 206* 359* 73 216* 209*   Lipid Profile: No results for input(s): CHOL, HDL, LDLCALC, TRIG, CHOLHDL, LDLDIRECT in the last 72 hours. Thyroid Function Tests: No results for input(s): TSH, T4TOTAL, FREET4, T3FREE, THYROIDAB in the last 72 hours. Anemia Panel: Recent Labs    11/04/19 0500 11/05/19 0531  FERRITIN 800* 742*   Sepsis Labs: Recent Labs  Lab 11/01/19 1120  PROCALCITON 0.42  LATICACIDVEN 1.0    Recent Results (from the past 240 hour(s))  Blood Culture (routine x 2)     Status: None (Preliminary result)   Collection Time: 11/01/19 11:20 AM   Specimen:  BLOOD  Result Value Ref Range Status   Specimen Description   Final    BLOOD LEFT WRIST Performed at [facility] [address] [place], [place] [address]    Special Requests   Final    BOTTLES DRAWN AEROBIC AND ANAEROBIC Blood Culture results may not be optimal due to an inadequate volume of blood received in culture bottles Performed at [facility] [address], [place], [place] [address]    Culture   Final    NO GROWTH 4 DAYS Performed at [facility], [place] [address] [place], [place] [address]    Report Status PENDING  Incomplete  Urine culture     Status: None   Collection Time: 11/01/19 12:18 PM   Specimen: In/Out Cath Urine  Result Value Ref Range Status   Specimen Description   Final    IN/OUT CATH URINE Performed at [facility] [address], [place], [place] [address]    Special Requests   Final    NONE Performed at [facility], [place] [address], [place], [place] [address]    Culture   Final    NO GROWTH Performed at [facility], [place] [address], [place], [place] [address]    Report Status 11/02/2019 FINAL  Final         Radiology Studies: No results found.      Scheduled Meds: . allopurinol  100 mg Oral Daily  . amLODipine  5 mg Oral Daily  . aspirin EC  81 mg Oral Daily  . atorvastatin  20 mg Oral Daily  . dexamethasone (DECADRON) injection  6 mg Intravenous Q24H  . diclofenac Sodium  4 g Topical QID  . DULoxetine  60 mg Oral Daily  . fluticasone furoate-vilanterol  1 puff Inhalation Daily  . gabapentin  100 mg Oral BID  . guaiFENesin  600 mg Oral BID  . heparin  5,000 Units Subcutaneous Q8H  . insulin aspart  0-15 Units Subcutaneous TID WC  . insulin aspart  0-5 Units Subcutaneous QHS  . insulin aspart  6 Units Subcutaneous TID WC  . insulin glargine  15 Units Subcutaneous QHS  . isosorbide mononitrate  30 mg Oral Daily  . levETIRAcetam  500 mg  Oral BID  . linaclotide  145 mcg Oral QAC breakfast  . memantine  10 mg Oral BID  . metoprolol succinate  50 mg Oral Daily  . montelukast  10 mg Oral QHS  . pantoprazole  40 mg Oral Daily   Continuous Infusions:   LOS: 4 days    Time spent: 25 mins.More than 50% of that time was spent in counseling and/or coordination of care.      Shelly Coss, MD [facility] P5/09/2019, 11:18 AM

## 2019-11-06 DIAGNOSIS — U071 COVID-19: Secondary | ICD-10-CM | POA: Diagnosis not present

## 2019-11-06 DIAGNOSIS — J1282 Pneumonia due to Coronavirus disease 2019: Secondary | ICD-10-CM | POA: Diagnosis not present

## 2019-11-06 LAB — COMPREHENSIVE METABOLIC PANEL
ALT: 42 U/L (ref 0–44)
AST: 50 U/L — ABNORMAL HIGH (ref 15–41)
Albumin: 3.2 g/dL — ABNORMAL LOW (ref 3.5–5.0)
Alkaline Phosphatase: 81 U/L (ref 38–126)
Anion gap: 8 (ref 5–15)
BUN: 60 mg/dL — ABNORMAL HIGH (ref 8–23)
CO2: 18 mmol/L — ABNORMAL LOW (ref 22–32)
Calcium: 9.2 mg/dL (ref 8.9–10.3)
Chloride: 108 mmol/L (ref 98–111)
Creatinine, Ser: 1.75 mg/dL — ABNORMAL HIGH (ref 0.44–1.00)
GFR calc Af Amer: 31 mL/min — ABNORMAL LOW (ref >60)
GFR calc non Af Amer: 27 mL/min — ABNORMAL LOW (ref >60)
Glucose, Bld: 291 mg/dL — ABNORMAL HIGH (ref 70–99)
Potassium: 5.4 mmol/L — ABNORMAL HIGH (ref 3.5–5.1)
Sodium: 134 mmol/L — ABNORMAL LOW (ref 135–145)
Total Bilirubin: 0.7 mg/dL (ref 0.3–1.2)
Total Protein: 8.1 g/dL (ref 6.5–8.1)

## 2019-11-06 LAB — GLUCOSE, CAPILLARY
Glucose-Capillary: 302 mg/dL — ABNORMAL HIGH (ref 70–99)
Glucose-Capillary: 207 mg/dL — ABNORMAL HIGH (ref 70–99)
Glucose-Capillary: 250 mg/dL — ABNORMAL HIGH (ref 70–99)

## 2019-11-06 LAB — CULTURE, BLOOD (ROUTINE X 2): Culture: NO GROWTH

## 2019-11-06 LAB — CBC WITH DIFFERENTIAL/PLATELET
Abs Immature Granulocytes: 0.04 10*3/uL (ref 0.00–0.07)
Basophils Absolute: 0 10*3/uL (ref 0.0–0.1)
Basophils Relative: 1 %
Eosinophils Absolute: 0 10*3/uL (ref 0.0–0.5)
Eosinophils Relative: 0 %
HCT: 38.2 % (ref 36.0–46.0)
Hemoglobin: 13 g/dL (ref 12.0–15.0)
Immature Granulocytes: 1 %
Lymphocytes Relative: 21 %
Lymphs Abs: 1.8 10*3/uL (ref 0.7–4.0)
MCH: 24.7 pg — ABNORMAL LOW (ref 26.0–34.0)
MCHC: 34 g/dL (ref 30.0–36.0)
MCV: 72.6 fL — ABNORMAL LOW (ref 80.0–100.0)
Monocytes Absolute: 0.3 10*3/uL (ref 0.1–1.0)
Monocytes Relative: 4 %
Neutro Abs: 6.3 10*3/uL (ref 1.7–7.7)
Neutrophils Relative %: 73 %
Platelets: 194 10*3/uL (ref 150–400)
RBC: 5.26 MIL/uL — ABNORMAL HIGH (ref 3.87–5.11)
RDW: 13.7 % (ref 11.5–15.5)
WBC: 8.5 10*3/uL (ref 4.0–10.5)
nRBC: 0.8 % — ABNORMAL HIGH (ref 0.0–0.2)

## 2019-11-06 LAB — C-REACTIVE PROTEIN: CRP: 0.9 mg/dL (ref <1.0)

## 2019-11-06 LAB — MAGNESIUM: Magnesium: 2.2 mg/dL (ref 1.7–2.4)

## 2019-11-06 LAB — D-DIMER, QUANTITATIVE: D-Dimer, Quant: 0.81 ug/mL-FEU — ABNORMAL HIGH (ref 0.00–0.50)

## 2019-11-06 LAB — FERRITIN: Ferritin: 658 ng/mL — ABNORMAL HIGH (ref 11–307)

## 2019-11-06 MED ORDER — INSULIN ASPART 100 UNIT/ML ~~LOC~~ SOLN
8.0000 [IU] | Freq: Three times a day (TID) | SUBCUTANEOUS | Status: DC
  Administered 2019-11-06 – 2019-11-07 (×3): 8 [IU] via SUBCUTANEOUS

## 2019-11-06 MED ORDER — SODIUM ZIRCONIUM CYCLOSILICATE 5 G PO PACK
5.0000 g | PACK | Freq: Once | ORAL | Status: AC
  Administered 2019-11-06: 18:00:00 5 g via ORAL
  Filled 2019-11-06 (×2): qty 1

## 2019-11-06 MED ORDER — INSULIN GLARGINE 100 UNIT/ML ~~LOC~~ SOLN
20.0000 [IU] | Freq: Every day | SUBCUTANEOUS | Status: DC
  Administered 2019-11-06: 20 [IU] via SUBCUTANEOUS
  Filled 2019-11-06: qty 0.2

## 2019-11-06 NOTE — Progress Notes (Signed)
PROGRESS NOTE    Brittany Roberts  DDU:[id number] DOB: 07-24-1940 DOA: 11/01/2019 PCP: [name], [name], MD   Brief Narrative:  Patient is a 79 y.o.femalewith past medical history of bipolar disorder, congestive heart failure, coronary artery disease, depression, fibromyalgia, dementia, GERD, hypothyroidism, hypertension,diabetes mellitus, chronic kidney disease presented to the hospital with complaints of fatigue,weakness and shortness of breath. Patient does have underlying dementia but has been getting little worse over the last few days.as per the report, her granddaughter had recently visited after which she started having some dry cough, throat irritation and dyspnea .She complainedmostly of congestion in her chest. In the ED, patient noted to be hypoxic and required 2 L of nasal cannula oxygen. BUN was 40 with a creatinine of 2.2.Chest x-ray showed some congestive findings. COVID 19-Was positive.Patient was started on remdesivir and Decadron.  Hospital course remarkable for significant wheezing.  Currently on room air.  PT/OT recommended skilled nursing  facility.   She finished remdesivir course, she is hemodynamically stable for discharge to nursing facility as soon as bed is available.  Assessment & Plan:   Principal Problem:   Pneumonia due to COVID-19 virus Active Problems:   Hypothyroidism   Hypercholesteremia   Morbid obesity ([place])   COPD (chronic obstructive pulmonary disease) ([place])   GERD   Controlled type 2 diabetes with neuropathy ([place])   Respiratory failure ([place])  Pneumonia secondary to COVID-19 with acute hypoxic respiratory failure :She was started on   remdesivir IV, dexamethasone IV. She completed the course of remdesivir.  Currently she is maintaining her saturation on room air but was severely wheezing ,so  currently on Solu-Medrol. Inflammatory biomarkers - trending down. Chest x-ray showed evidence of vascular congestion. Lactate was 1.0.  procalcitonin 0.42 . No leukocytosis.  History of COPD. Continue oxygen as needed,bronchodilators.IV steroids.  Not on oxygen at home.wheezings improved this morning.  GERD. Continue PPI.  Type 2 diabetes mellitus with neuropathy. On  sliding scale insulin,Lantus at bedtime.  Monitor CBGs  Essential hypertension.Continue amlodipine,metoprolol.Monitor blood pressure.  Hyperlipidemia. Continue Lipitor.  Mild acute kidney injury on chronic kidney disease likely stage IIIa.Creatinine of 1.4 ,oneyear back. Creatinine of 1.7 today. Will decrease the dose of gabapentinfor renal dysfunction. Hold Lasix.   Hyperkalemia:Persistently hyperkalemic.  We will give her dose of Lokelma again today and probably continue Lokelma daily.  Dementia.Continue memantine.  Debility. Has a walker at home. Lives alone. Has Aide coming to the house.PT recommending SnF  Mild thrombocytopenia.Stable  Sleep apnea: uses oxygen at night. Uses cpap.        DVT prophylaxis: Heparin [place] Code Status: DNR Family Communication: Called daughter on phone for update on 11/05/19 Status is: Inpatient  Remains inpatient appropriate because:IV treatments appropriate due to intensity of illness or inability to take PO   Dispo: The patient is from: Home              Anticipated d/c is to: SNF              Anticipated d/c date is: As soon as SNF is available              Patient currently medically stable for DC   Consultants: None  Procedures:None  Antimicrobials:  Anti-infectives (From admission, onward)   Start     Dose/Rate Route Frequency Ordered Stop   11/02/19 1000  remdesivir 100 mg in sodium chloride 0.9 % 100 mL IVPB     100 mg 200 mL/hr over 30 Minutes Intravenous Daily 11/01/19  1312 11/05/19 1014   11/01/19 1330  remdesivir 100 mg in sodium chloride 0.9 % 100 mL IVPB     100 mg 200 mL/hr over 30 Minutes Intravenous Every 1 hr x 2 11/01/19 1312 11/01/19 1548   11/01/19  1200  levofloxacin (LEVAQUIN) IVPB 750 mg     750 mg 100 mL/hr over 90 Minutes Intravenous  Once 11/01/19 1151 11/01/19 1425      Subjective: Patient seen and examined at bedside this morning.  Currently stable.  Wheezings have improved.  Currently on room air.  Alert and awake but not oriented.  Objective: Vitals:   11/05/19 0936 11/05/19 1413 11/05/19 2040 11/06/19 0552  BP: (!) 144/80 126/64 (!) 155/80 (!) 154/86  Pulse: 62 (!) 56 (!) 58 60  Resp:  20 (!) 22 20  Temp:  98.6 F (37 C) (!) 97.5 F (36.4 C) (!) 97.5 F (36.4 C)  TempSrc:  Oral Oral Oral  SpO2:  96% 100% 93%  Weight:    125.6 kg  Height:        Intake/Output Summary (Last 24 hours) at 11/06/2019 0820 Last data filed at 11/06/2019 9518 Gross per 24 hour  Intake 600 ml  Output 350 ml  Net 250 ml   Filed Weights   11/04/19 1548 11/05/19 0441 11/06/19 0552  Weight: 127.7 kg 127.8 kg 125.6 kg    Examination:  General exam: Morbidly obese, comfortable HEENT:PERRL,Oral mucosa moist, Ear/Nose normal on gross exam Respiratory system: Bilateral mild expiratory wheezes  cardiovascular system: S1 & S2 heard, RRR. No JVD, murmurs, rubs, gallops or clicks. Gastrointestinal system: Abdomen is nondistended, soft and nontender. No organomegaly or masses felt. Normal bowel sounds heard. Central nervous system: Alert and awake but not oriented. No focal neurological deficits. Extremities: No edema, no clubbing ,no cyanosis, distal peripheral pulses palpable. Skin: No rashes, lesions or ulcers,no icterus ,no pallor   Data Reviewed: I have personally reviewed following labs and imaging studies  CBC: Recent Labs  Lab 11/02/19 0627 11/03/19 0500 11/04/19 0500 11/05/19 0531 11/06/19 0416  WBC 3.7* 6.7 9.6 9.0 8.5  NEUTROABS 2.0 4.8 7.7 6.7 6.3  HGB 13.2 12.4 12.0 12.7 13.0  HCT 41.0 37.4 36.0 37.6 38.2  MCV 76.4* 74.8* 74.1* 73.9* 72.6*  PLT 114* 142* 168 170 841   Basic Metabolic Panel: Recent Labs  Lab  11/02/19 0627 11/02/19 1130 11/03/19 0500 11/03/19 0830 11/04/19 0500 11/05/19 0531 11/06/19 0416  NA 136  --  136  --  138 135 134*  K 6.0*   < > 5.8* 5.2* 5.4* 5.0 5.4*  CL 107  --  110  --  112* 107 108  CO2 21*  --  20*  --  20* 20* 18*  GLUCOSE 213*  --  232*  --  224* 237* 291*  BUN 57*  --  60*  --  65* 63* 60*  CREATININE 2.04*  --  1.97*  --  1.94* 1.61* 1.75*  CALCIUM 8.4*  --  8.6*  --  8.9 9.0 9.2  MG 2.2  --  2.1  --  2.3 2.3 2.2  PHOS  --   --   --   --  3.3  --   --    < > = values in this interval not displayed.   GFR: Estimated Creatinine Clearance: 33.4 mL/min (A) (by C-G formula based on SCr of 1.75 mg/dL (H)). Liver Function Tests: Recent Labs  Lab 11/02/19 0627 11/03/19 0500 11/04/19 0500 11/05/19 0531  11/06/19 0416  AST 60* 59* 52* 53* 50*  ALT 32 33 31 38 42  ALKPHOS 78 75 79 77 81  BILITOT 0.6 0.7 0.5 0.7 0.7  PROT 8.1 7.5 7.7 8.0 8.1  ALBUMIN 3.0* 2.8* 3.0* 3.2* 3.2*   No results for input(s): LIPASE, AMYLASE in the last 168 hours. No results for input(s): AMMONIA in the last 168 hours. Coagulation Profile: Recent Labs  Lab 11/01/19 1120  INR 1.0   Cardiac Enzymes: No results for input(s): CKTOTAL, CKMB, CKMBINDEX, TROPONINI in the last 168 hours. BNP (last 3 results) No results for input(s): PROBNP in the last 8760 hours. HbA1C: No results for input(s): HGBA1C in the last 72 hours. CBG: Recent Labs  Lab 11/05/19 0928 11/05/19 1353 11/05/19 1714 11/05/19 2042 11/05/19 2321  GLUCAP 209* 150* 208* 343* 284*   Lipid Profile: No results for input(s): CHOL, HDL, LDLCALC, TRIG, CHOLHDL, LDLDIRECT in the last 72 hours. Thyroid Function Tests: No results for input(s): TSH, T4TOTAL, FREET4, T3FREE, THYROIDAB in the last 72 hours. Anemia Panel: Recent Labs    11/05/19 0531 11/06/19 0416  FERRITIN 742* 658*   Sepsis Labs: Recent Labs  Lab 11/01/19 1120  PROCALCITON 0.42  LATICACIDVEN 1.0    Recent Results (from the past 240  hour(s))  Blood Culture (routine x 2)     Status: None   Collection Time: 11/01/19 11:20 AM   Specimen: BLOOD  Result Value Ref Range Status   Specimen Description   Final    BLOOD LEFT WRIST Performed at [facility] [address] [place], [place] [address]    Special Requests   Final    BOTTLES DRAWN AEROBIC AND ANAEROBIC Blood Culture results may not be optimal due to an inadequate volume of blood received in culture bottles Performed at [facility] [address] [place], [place] [address]    Culture   Final    NO GROWTH 5 DAYS Performed at [facility], [place] [address] [place], [place] [address]    Report Status 11/06/2019 FINAL  Final  Urine culture     Status: None   Collection Time: 11/01/19 12:18 PM   Specimen: In/Out Cath Urine  Result Value Ref Range Status   Specimen Description   Final    IN/OUT CATH URINE Performed at [facility] [address] [place], [place] [address]    Special Requests   Final    NONE Performed at [facility], [place] [address] [place], [place] [address]    Culture   Final    NO GROWTH Performed at [facility], [place] [address], [place], [place] [address]    Report Status 11/02/2019 FINAL  Final         Radiology Studies: DG Chest 1 View  Result Date: 11/05/2019 CLINICAL DATA:  Wheezing, COVID positive EXAM: CHEST  1 VIEW COMPARISON:  11/01/2019 FINDINGS: Hazy right basilar airspace disease. Mild bilateral interstitial thickening. No pleural effusion or pneumothorax. Stable cardiomegaly. No acute osseous abnormality. IMPRESSION: Hazy right basilar airspace disease concerning for pneumonia. Mild bilateral interstitial thickening may reflect mild interstitial edema versus infection. Electronically Signed   By: Kathreen Devoid   On: 11/05/2019 13:38        Scheduled Meds: . allopurinol  100 mg Oral Daily  . amLODipine  5 mg Oral Daily  .  aspirin EC  81 mg Oral Daily  . atorvastatin  20 mg Oral Daily  .  diclofenac Sodium  4 g Topical QID  . DULoxetine  60 mg Oral Daily  . enoxaparin (LOVENOX) injection  60 mg Subcutaneous Q24H  . fluticasone furoate-vilanterol  1 puff Inhalation Daily  . gabapentin  100 mg Oral BID  . guaiFENesin  600 mg Oral BID  . insulin aspart  0-15 Units Subcutaneous TID WC  . insulin aspart  0-5 Units Subcutaneous QHS  . insulin aspart  6 Units Subcutaneous TID WC  . insulin glargine  15 Units Subcutaneous QHS  . isosorbide mononitrate  30 mg Oral Daily  . levETIRAcetam  500 mg Oral BID  . linaclotide  145 mcg Oral QAC breakfast  . memantine  10 mg Oral BID  . methylPREDNISolone (SOLU-MEDROL) injection  60 mg Intravenous Q12H  . metoprolol succinate  50 mg Oral Daily  . montelukast  10 mg Oral QHS  . pantoprazole  40 mg Oral Daily  . sodium zirconium cyclosilicate  5 g Oral Once   Continuous Infusions:   LOS: 5 days    Time spent: 25 mins.More than 50% of that time was spent in counseling and/or coordination of care.      Shelly Coss, MD [facility] P5/10/2019, 8:20 AM

## 2019-11-06 NOTE — Progress Notes (Signed)
Unable to utilize CPAP QHS due to ++COVID

## 2019-11-06 NOTE — Progress Notes (Signed)
Inpatient Diabetes Program Recommendations  AACE/ADA: New Consensus Statement on Inpatient Glycemic Control (2015)  Target Ranges:  Prepandial:   less than 140 mg/dL      Peak postprandial:   less than 180 mg/dL (1-2 hours)      Critically ill patients:  140 - 180 mg/dL   Lab Results  Component Value Date   GLUCAP 284 (H) 11/05/2019   HGBA1C 7.6 (H) 10/17/2019    Review of Glycemic Control  Glucose 291 mg/dL this am. Post-prandials > 180 mg/dL on 5/3. Needs titration.   Inpatient Diabetes Program Recommendations:     Increase Lantus 20 units QD Increase Novolog to 8 units tidwc for meal coverage insulin.  Continue to follow.  Thank you. Lorenda Peck, RD, LDN, CDE Inpatient Diabetes Coordinator [phone]

## 2019-11-06 NOTE — Evaluation (Signed)
Occupational Therapy Evaluation Patient Details Name: Brittany Roberts MRN: [id number] DOB: 1941/02/04 Today's Date: 11/06/2019    History of Present Illness Pt admitted with SOB 2 COVID PNA; Pt with hx of DM, Lupus, Peripheral neuropathy, CAD, CHF, COPD, , Bipolar, and memory loss   Clinical Impression   Difficulty with obtaining PLOF /assessing cognition as patient presenting as HOH. On multiple occasions prompt patient with cues with limited carry over such as using soap to wash hands, transitioning L hand onto walker vs holding sink. Patient demonstrates decreased safety awareness, trying to ambulate without walker initially then pushing walker with one UE, max cues to redirect. Pt require min A to stand from toilet and cues for body mechanics. Patient also dyspneic with minimal activity ambulating to/from bathroom, O2 reading 94% on room air after ~2 min seated rest (had to locate dinamap). As patient typically lives alone recommend SNF prior to D/C home to maximize patient safety and independence with self care.    Follow Up Recommendations  SNF    Equipment Recommendations  Other (comment)(defer to next venue)       Precautions / Restrictions Precautions Precautions: Fall Restrictions Weight Bearing Restrictions: No      Mobility Bed Mobility               General bed mobility comments: seated in recliner upon arrival  Transfers Overall transfer level: Needs assistance Equipment used: Rolling walker (2 wheeled) Transfers: Sit to/from Stand Sit to Stand: Min assist         General transfer comment: decreased safety awareness requiring cues for body mechanics    Balance Overall balance assessment: Needs assistance Sitting-balance support: No upper extremity supported;Feet supported Sitting balance-Leahy Scale: Good     Standing balance support: No upper extremity supported;Bilateral upper extremity supported;During functional activity Standing balance-Leahy Scale:  Poor Standing balance comment: patient stands before walker in place, reaching for bed for stability, introduced walker for standing balance                            ADL either performed or assessed with clinical judgement   ADL Overall ADL's : Needs assistance/impaired     Grooming: Wash/dry hands;Min guard;Standing Grooming Details (indicate cue type and reason): prompted patient 3 times to use soap however does not carry over (HOH?) Upper Body Bathing: Minimal assistance;Sitting   Lower Body Bathing: Moderate assistance;Sitting/lateral leans;Sit to/from stand;Maximal assistance   Upper Body Dressing : Minimal assistance;Sitting   Lower Body Dressing: Moderate assistance;Maximal assistance;Sitting/lateral leans;Sit to/from stand   Toilet Transfer: Minimal assistance;Cueing for safety;Regular Toilet;Grab bars;RW;Ambulation Toilet Transfer Details (indicate cue type and reason): cues to use grab bar and min A to come to standing, recommend comfort height toilet Toileting- Clothing Manipulation and Hygiene: Min guard;Sit to/from stand Toileting - Clothing Manipulation Details (indicate cue type and reason): peri care after small bowel movement     Functional mobility during ADLs: Rolling walker;Cueing for safety;Minimal assistance General ADL Comments: patient pushing walker with one upper extremity and trying to hold furniture with other, cue patient for safety. decreased activity tolerance with labored breathing after ambulating to bathroom                  Pertinent Vitals/Pain Pain Assessment: No/denies pain     Hand Dominance Right   Extremity/Trunk Assessment Upper Extremity Assessment Upper Extremity Assessment: Generalized weakness   Lower Extremity Assessment Lower Extremity Assessment: Defer to PT evaluation  Communication Communication Communication: HOH   Cognition Arousal/Alertness: Awake/alert Behavior During Therapy: WFL for tasks  assessed/performed Overall Cognitive Status: History of cognitive impairments - at baseline                                 General Comments: patient disoriented to time unable to state the month, asked multiple times to state where she is with no response. Unsure if patient HOH vs expressive difficulties. minimal verbalization during session. decreased safety awareness throughout   General Comments  pt read 94% on room air after seated rest break ~2 mins            Home Living Family/patient expects to be discharged to:: Private residence Living Arrangements: Alone   Type of Home: Apartment Home Access: Level entry     Home Layout: One level     Bathroom Shower/Tub: Occupational psychologist: Handicapped height Bathroom Accessibility: Yes How Accessible: Accessible via walker Home Equipment: [place] - 4 wheels   Additional Comments: Pt lives alone - states her dtr lives across the street      Prior Functioning/Environment Level of Independence: Needs assistance  Gait / Transfers Assistance Needed: uses RW ADL's / Homemaking Assistance Needed: Pt states she does her own cooking and cleaning; previous chart from 2019 states pt had assist of aide 2-3 hrs a day   Comments: info obtained from PT eval        OT Problem List: Decreased strength;Decreased activity tolerance;Impaired balance (sitting and/or standing);Decreased safety awareness;Decreased cognition;Decreased knowledge of use of DME or AE;Obesity      OT Treatment/Interventions: Self-care/ADL training;Therapeutic exercise;Energy conservation;DME and/or AE instruction;Therapeutic activities;Cognitive remediation/compensation;Patient/family education;Balance training    OT Goals(Current goals can be found in the care plan section) Acute Rehab OT Goals Patient Stated Goal: "I'm ready to go home" OT Goal Formulation: With patient Time For Goal Achievement: 11/20/19 Potential to Achieve Goals:  Good  OT Frequency: Min 2X/week    AM-PAC OT "6 Clicks" Daily Activity     Outcome Measure Help from another person eating meals?: A Little Help from another person taking care of personal grooming?: A Little Help from another person toileting, which includes using toliet, bedpan, or urinal?: A Little Help from another person bathing (including washing, rinsing, drying)?: A Lot Help from another person to put on and taking off regular upper body clothing?: A Little Help from another person to put on and taking off regular lower body clothing?: A Lot 6 Click Score: 16   End of Session Equipment Utilized During Treatment: Rolling walker Nurse Communication: Mobility status  Activity Tolerance: Patient limited by fatigue Patient left: in chair;with call bell/phone within reach  OT Visit Diagnosis: Other abnormalities of gait and mobility (R26.89);Muscle weakness (generalized) (M62.81);Other symptoms and signs involving cognitive function                Time: 1400-1418 OT Time Calculation (min): 18 min Charges:  OT General Charges $OT Visit: 1 Visit OT Evaluation $OT Eval Moderate Complexity: [place] OT Pager: [place] 11/06/2019, 3:11 PM

## 2019-11-06 NOTE — Care Management Important Message (Signed)
Important Message  Patient Details IM Letter given to Gabriel Earing RN Case Manager to present to the Patient Name: Brittany Roberts MRN: [id number] Date of Birth: [birth year]   Medicare Important Message Given:  Yes     Kerin Salen 11/06/2019, 11:41 AM

## 2019-11-06 NOTE — Plan of Care (Signed)
Spoke with Daughter for update and assisted her with also speaking with patient.

## 2019-11-07 DIAGNOSIS — U071 COVID-19: Secondary | ICD-10-CM | POA: Diagnosis not present

## 2019-11-07 DIAGNOSIS — J1282 Pneumonia due to Coronavirus disease 2019: Secondary | ICD-10-CM | POA: Diagnosis not present

## 2019-11-07 LAB — BASIC METABOLIC PANEL
Anion gap: 9 (ref 5–15)
Anion gap: 7 (ref 5–15)
BUN: 68 mg/dL — ABNORMAL HIGH (ref 8–23)
BUN: 65 mg/dL — ABNORMAL HIGH (ref 8–23)
CO2: 18 mmol/L — ABNORMAL LOW (ref 22–32)
CO2: 23 mmol/L (ref 22–32)
Calcium: 9.3 mg/dL (ref 8.9–10.3)
Calcium: 9.4 mg/dL (ref 8.9–10.3)
Chloride: 107 mmol/L (ref 98–111)
Chloride: 106 mmol/L (ref 98–111)
Creatinine, Ser: 1.88 mg/dL — ABNORMAL HIGH (ref 0.44–1.00)
Creatinine, Ser: 1.85 mg/dL — ABNORMAL HIGH (ref 0.44–1.00)
GFR calc Af Amer: 29 mL/min — ABNORMAL LOW (ref >60)
GFR calc Af Amer: 29 mL/min — ABNORMAL LOW (ref >60)
GFR calc non Af Amer: 25 mL/min — ABNORMAL LOW (ref >60)
GFR calc non Af Amer: 25 mL/min — ABNORMAL LOW (ref >60)
Glucose, Bld: 248 mg/dL — ABNORMAL HIGH (ref 70–99)
Glucose, Bld: 299 mg/dL — ABNORMAL HIGH (ref 70–99)
Potassium: 5.7 mmol/L — ABNORMAL HIGH (ref 3.5–5.1)
Potassium: 5 mmol/L (ref 3.5–5.1)
Sodium: 134 mmol/L — ABNORMAL LOW (ref 135–145)
Sodium: 136 mmol/L (ref 135–145)

## 2019-11-07 LAB — GLUCOSE, CAPILLARY
Glucose-Capillary: 277 mg/dL — ABNORMAL HIGH (ref 70–99)
Glucose-Capillary: 389 mg/dL — ABNORMAL HIGH (ref 70–99)
Glucose-Capillary: 122 mg/dL — ABNORMAL HIGH (ref 70–99)
Glucose-Capillary: 109 mg/dL — ABNORMAL HIGH (ref 70–99)

## 2019-11-07 MED ORDER — FUROSEMIDE 40 MG PO TABS
40.0000 mg | ORAL_TABLET | Freq: Every day | ORAL | Status: DC
  Administered 2019-11-07 – 2019-11-08 (×2): 40 mg via ORAL
  Filled 2019-11-07 (×2): qty 1

## 2019-11-07 MED ORDER — SODIUM BICARBONATE 650 MG PO TABS
650.0000 mg | ORAL_TABLET | Freq: Two times a day (BID) | ORAL | Status: DC
  Administered 2019-11-07 – 2019-11-08 (×3): 650 mg via ORAL
  Filled 2019-11-07 (×3): qty 1

## 2019-11-07 MED ORDER — INSULIN GLARGINE 100 UNIT/ML ~~LOC~~ SOLN
30.0000 [IU] | Freq: Every day | SUBCUTANEOUS | Status: DC
  Administered 2019-11-07: 30 [IU] via SUBCUTANEOUS
  Filled 2019-11-07: qty 0.3

## 2019-11-07 MED ORDER — SODIUM ZIRCONIUM CYCLOSILICATE 5 G PO PACK
5.0000 g | PACK | Freq: Every day | ORAL | Status: DC
  Administered 2019-11-08: 5 g via ORAL
  Filled 2019-11-07: qty 1

## 2019-11-07 MED ORDER — METHYLPREDNISOLONE SODIUM SUCC 125 MG IJ SOLR
60.0000 mg | Freq: Once | INTRAMUSCULAR | Status: AC
  Administered 2019-11-08: 60 mg via INTRAVENOUS
  Filled 2019-11-07: qty 2

## 2019-11-07 MED ORDER — SODIUM ZIRCONIUM CYCLOSILICATE 10 G PO PACK
10.0000 g | PACK | Freq: Once | ORAL | Status: AC
  Administered 2019-11-07: 10 g via ORAL
  Filled 2019-11-07: qty 1

## 2019-11-07 NOTE — TOC Progression Note (Signed)
Transition of Care [facility]) - Progression Note    Patient Details  Name: Brittany Roberts MRN: [id number] Date of Birth: 05-10-1941  [facility] CM/SW Contact  Purcell Mouton, RN Phone Number: 11/07/2019, 12:52 PM  Clinical Narrative:    Spoke with pt's daughter concerning SNF. PT was accepted at [facility] and both daughter's are happy with that. Place to discharge in the AM.  Maury [phone], Everlene Balls [phone] 5/5-11/09/19. Marjean Donna, RN, CM with [employer].         Expected Discharge Plan and Services                                                 Social Determinants of Health (SDOH) Interventions    Readmission Risk Interventions No flowsheet data found.

## 2019-11-07 NOTE — Progress Notes (Signed)
Nocturnal cpap held due to positive covid status.

## 2019-11-07 NOTE — Progress Notes (Signed)
PROGRESS NOTE    Brittany Roberts  VFI:[id number] DOB: 11-Aug-1940 DOA: 11/01/2019 PCP: [name], [name], MD   Brief Narrative:  Patient is a 79 y.o.femalewith past medical history of bipolar disorder, congestive heart failure, coronary artery disease, depression, fibromyalgia, dementia, GERD, hypothyroidism, hypertension,diabetes mellitus, chronic kidney disease presented to the hospital with complaints of fatigue,weakness and shortness of breath. Patient does have underlying dementia but has been getting little worse over the last few days.as per the report, her granddaughter had recently visited after which she started having some dry cough, throat irritation and dyspnea .She complainedmostly of congestion in her chest. In the ED, patient noted to be hypoxic and required 2 L of nasal cannula oxygen. BUN was 40 with a creatinine of 2.2.Chest x-ray showed some congestive findings. COVID 19-Was positive.Patient was started on remdesivir and Decadron.  Hospital course remarkable for significant wheezing.  Currently on room air.  PT/OT recommended skilled nursing  facility.   She finished remdesivir course.  Currently she is on room air.  Could not discharge today due to worsening kidney function with hyperkalemia.  Plan for discharge tomorrow to skilled nursing facility. Assessment & Plan:   Principal Problem:   Pneumonia due to COVID-19 virus Active Problems:   Hypothyroidism   Hypercholesteremia   Morbid obesity ([place])   COPD (chronic obstructive pulmonary disease) ([place])   GERD   Controlled type 2 diabetes with neuropathy ([place])   Respiratory failure ([place])  Pneumonia secondary to COVID-19 with acute hypoxic respiratory failure :She was started on   remdesivir IV, dexamethasone IV. She completed the course of remdesivir.  Currently she is maintaining her saturation on room air but was severely wheezing ,so she was started  on Solu-Medrol. Inflammatory biomarkers - trending down.  Chest x-ray showed evidence of vascular congestion. Lactate was 1.0. procalcitonin 0.42 . No leukocytosis.  History of COPD. Continue oxygen as needed,bronchodilators.IV steroids.  Not on oxygen at home.Wheezings have improved.  GERD. Continue PPI.  Type 2 diabetes mellitus with neuropathy. On  sliding scale insulin,Lantus at bedtime.  Monitor CBGs  Essential hypertension.Continue amlodipine,metoprolol.Monitor blood pressure.  Hyperlipidemia. Continue Lipitor.  Mild acute kidney injury on chronic kidney disease likely stage IIIa.Creatinine of 1.4 ,oneyear back. Creatinine of 1.8 today. We have decreased the dose of gabapentinfor renal dysfunction. Resume  Lasix.   Hyperkalemia:Persistently hyperkalemic.  This is most likely associated with her chronic kidney disease, type IV renal tubular acidosis .we will give her dose of Lokelma again today and  continue Lokelma daily.  Started on sodium bicarb tablet.  Check BMP tomorrow  Dementia.Continue memantine.  Debility. Has a walker at home. Lives alone. Has Aide coming to the house.PT recommending SnF  Mild thrombocytopenia.Stable  Sleep apnea: uses oxygen at night. Uses cpap.        DVT prophylaxis: Heparin [place] Code Status: DNR Family Communication: Called daughter on phone for update on 11/07/19 Status is: Inpatient  Remains inpatient appropriate because of worsening renal function, hyperkalemia  Dispo: The patient is from: Home              Anticipated d/c is to: SNF              Anticipated d/c date is: SNF today if improvement in renal function and hyperkalemia              Patient currently is not medically stable for DC   Consultants: None  Procedures:None  Antimicrobials:  Anti-infectives (From admission, onward)   Start  Dose/Rate Route Frequency Ordered Stop   11/02/19 1000  remdesivir 100 mg in sodium chloride 0.9 % 100 mL IVPB     100 mg 200 mL/hr over 30 Minutes Intravenous  Daily 11/01/19 1312 11/05/19 1014   11/01/19 1330  remdesivir 100 mg in sodium chloride 0.9 % 100 mL IVPB     100 mg 200 mL/hr over 30 Minutes Intravenous Every 1 hr x 2 11/01/19 1312 11/01/19 1548   11/01/19 1200  levofloxacin (LEVAQUIN) IVPB 750 mg     750 mg 100 mL/hr over 90 Minutes Intravenous  Once 11/01/19 1151 11/01/19 1425      Subjective: Patient seen and examined at the bedside this morning.  Hemodynamically stable.  Currently on room air.      Objective: Vitals:   11/06/19 1300 11/06/19 2125 11/07/19 0511 11/07/19 0908  BP: (!) 142/70 (!) 150/76 (!) 164/82 140/77  Pulse: 62 70 (!) 53 63  Resp: 16 (!) 21    Temp: 98.7 F (37.1 C) 98.5 F (36.9 C) 97.6 F (36.4 C)   TempSrc: Oral Oral Oral   SpO2: 96% 94% 96%   Weight:      Height:        Intake/Output Summary (Last 24 hours) at 11/07/2019 1255 Last data filed at 11/06/2019 2129 Gross per 24 hour  Intake 240 ml  Output 500 ml  Net -260 ml   Filed Weights   11/04/19 1548 11/05/19 0441 11/06/19 0552  Weight: 127.7 kg 127.8 kg 125.6 kg    Examination:   General exam: Morbidly obese, comfortable, not in any kind of distress Respiratory system: Bilateral equal air entry, normal vesicular breath sounds, few expiratory wheezes  cardiovascular system: S1 & S2 heard, RRR. No JVD, murmurs, rubs, gallops or clicks. Gastrointestinal system: Abdomen is nondistended, soft and nontender. No organomegaly or masses felt. Normal bowel sounds heard. Central nervous system: Alert and awake but not oriented Extremities: No edema, no clubbing ,no cyanosis, distal peripheral pulses palpable. Skin: No rashes, lesions or ulcers,no icterus ,no pallor   Data Reviewed: I have personally reviewed following labs and imaging studies  CBC: Recent Labs  Lab 11/02/19 0627 11/03/19 0500 11/04/19 0500 11/05/19 0531 11/06/19 0416  WBC 3.7* 6.7 9.6 9.0 8.5  NEUTROABS 2.0 4.8 7.7 6.7 6.3  HGB 13.2 12.4 12.0 12.7 13.0  HCT 41.0 37.4  36.0 37.6 38.2  MCV 76.4* 74.8* 74.1* 73.9* 72.6*  PLT 114* 142* 168 170 811   Basic Metabolic Panel: Recent Labs  Lab 11/02/19 0627 11/02/19 1130 11/03/19 0500 11/03/19 0500 11/03/19 0830 11/04/19 0500 11/05/19 0531 11/06/19 0416 11/07/19 0340  NA 136  --  136  --   --  138 135 134* 134*  K 6.0*   < > 5.8*   < > 5.2* 5.4* 5.0 5.4* 5.7*  CL 107  --  110  --   --  112* 107 108 107  CO2 21*  --  20*  --   --  20* 20* 18* 18*  GLUCOSE 213*  --  232*  --   --  224* 237* 291* 248*  BUN 57*  --  60*  --   --  65* 63* 60* 68*  CREATININE 2.04*  --  1.97*  --   --  1.94* 1.61* 1.75* 1.88*  CALCIUM 8.4*  --  8.6*  --   --  8.9 9.0 9.2 9.3  MG 2.2  --  2.1  --   --  2.3 2.3  2.2  --   PHOS  --   --   --   --   --  3.3  --   --   --    < > = values in this interval not displayed.   GFR: Estimated Creatinine Clearance: 31 mL/min (A) (by C-G formula based on SCr of 1.88 mg/dL (H)). Liver Function Tests: Recent Labs  Lab 11/02/19 0627 11/03/19 0500 11/04/19 0500 11/05/19 0531 11/06/19 0416  AST 60* 59* 52* 53* 50*  ALT 32 33 31 38 42  ALKPHOS 78 75 79 77 81  BILITOT 0.6 0.7 0.5 0.7 0.7  PROT 8.1 7.5 7.7 8.0 8.1  ALBUMIN 3.0* 2.8* 3.0* 3.2* 3.2*   No results for input(s): LIPASE, AMYLASE in the last 168 hours. No results for input(s): AMMONIA in the last 168 hours. Coagulation Profile: Recent Labs  Lab 11/01/19 1120  INR 1.0   Cardiac Enzymes: No results for input(s): CKTOTAL, CKMB, CKMBINDEX, TROPONINI in the last 168 hours. BNP (last 3 results) No results for input(s): PROBNP in the last 8760 hours. HbA1C: No results for input(s): HGBA1C in the last 72 hours. CBG: Recent Labs  Lab 11/06/19 1149 11/06/19 1701 11/06/19 2128 11/07/19 0740 11/07/19 1129  GLUCAP 302* 207* 250* 277* 389*   Lipid Profile: No results for input(s): CHOL, HDL, LDLCALC, TRIG, CHOLHDL, LDLDIRECT in the last 72 hours. Thyroid Function Tests: No results for input(s): TSH, T4TOTAL, FREET4,  T3FREE, THYROIDAB in the last 72 hours. Anemia Panel: Recent Labs    11/05/19 0531 11/06/19 0416  FERRITIN 742* 658*   Sepsis Labs: Recent Labs  Lab 11/01/19 1120  PROCALCITON 0.42  LATICACIDVEN 1.0    Recent Results (from the past 240 hour(s))  Blood Culture (routine x 2)     Status: None   Collection Time: 11/01/19 11:20 AM   Specimen: BLOOD  Result Value Ref Range Status   Specimen Description   Final    BLOOD LEFT WRIST Performed at [facility] [address] [place], [place] [address]    Special Requests   Final    BOTTLES DRAWN AEROBIC AND ANAEROBIC Blood Culture results may not be optimal due to an inadequate volume of blood received in culture bottles Performed at [facility] [address], [place], [place] [address]    Culture   Final    NO GROWTH 5 DAYS Performed at [facility], [place] [address], [place], [place] [address]    Report Status 11/06/2019 FINAL  Final  Urine culture     Status: None   Collection Time: 11/01/19 12:18 PM   Specimen: In/Out Cath Urine  Result Value Ref Range Status   Specimen Description   Final    IN/OUT CATH URINE Performed at [facility] [address] [place], [place] [address]    Special Requests   Final    NONE Performed at [facility], [place] [address], [place], [place] [address]    Culture   Final    NO GROWTH Performed at [facility], [place] [address] [place], [place] [address]    Report Status 11/02/2019 FINAL  Final         Radiology Studies: DG Chest 1 View  Result Date: 11/05/2019 CLINICAL DATA:  Wheezing, COVID positive EXAM: CHEST  1 VIEW COMPARISON:  11/01/2019 FINDINGS: Hazy right basilar airspace disease. Mild bilateral interstitial thickening. No pleural effusion or pneumothorax. Stable cardiomegaly. No acute osseous abnormality.  IMPRESSION: Hazy right basilar airspace disease concerning for pneumonia. Mild  bilateral interstitial thickening may reflect mild interstitial edema versus infection. Electronically Signed   By: Kathreen Devoid   On: 11/05/2019 13:38        Scheduled Meds: . allopurinol  100 mg Oral Daily  . amLODipine  5 mg Oral Daily  . aspirin EC  81 mg Oral Daily  . atorvastatin  20 mg Oral Daily  . diclofenac Sodium  4 g Topical QID  . DULoxetine  60 mg Oral Daily  . enoxaparin (LOVENOX) injection  60 mg Subcutaneous Q24H  . fluticasone furoate-vilanterol  1 puff Inhalation Daily  . furosemide  40 mg Oral Daily  . gabapentin  100 mg Oral BID  . guaiFENesin  600 mg Oral BID  . insulin aspart  0-15 Units Subcutaneous TID WC  . insulin aspart  0-5 Units Subcutaneous QHS  . insulin aspart  8 Units Subcutaneous TID WC  . insulin glargine  20 Units Subcutaneous QHS  . isosorbide mononitrate  30 mg Oral Daily  . levETIRAcetam  500 mg Oral BID  . linaclotide  145 mcg Oral QAC breakfast  . memantine  10 mg Oral BID  . [START ON 11/08/2019] methylPREDNISolone (SOLU-MEDROL) injection  60 mg Intravenous Once  . metoprolol succinate  50 mg Oral Daily  . montelukast  10 mg Oral QHS  . pantoprazole  40 mg Oral Daily  . sodium bicarbonate  650 mg Oral BID  . [START ON 11/08/2019] sodium zirconium cyclosilicate  5 g Oral Daily   Continuous Infusions:   LOS: 6 days    Time spent: 25 mins.More than 50% of that time was spent in counseling and/or coordination of care.      Shelly Coss, MD [facility] P5/11/2019, 12:55 PM

## 2019-11-08 DIAGNOSIS — J449 Chronic obstructive pulmonary disease, unspecified: Secondary | ICD-10-CM | POA: Diagnosis not present

## 2019-11-08 DIAGNOSIS — E1149 Type 2 diabetes mellitus with other diabetic neurological complication: Secondary | ICD-10-CM | POA: Diagnosis not present

## 2019-11-08 DIAGNOSIS — R1312 Dysphagia, oropharyngeal phase: Secondary | ICD-10-CM | POA: Diagnosis not present

## 2019-11-08 DIAGNOSIS — J45909 Unspecified asthma, uncomplicated: Secondary | ICD-10-CM | POA: Diagnosis not present

## 2019-11-08 DIAGNOSIS — J9601 Acute respiratory failure with hypoxia: Secondary | ICD-10-CM | POA: Diagnosis not present

## 2019-11-08 DIAGNOSIS — I10 Essential (primary) hypertension: Secondary | ICD-10-CM | POA: Diagnosis not present

## 2019-11-08 DIAGNOSIS — Z743 Need for continuous supervision: Secondary | ICD-10-CM | POA: Diagnosis not present

## 2019-11-08 DIAGNOSIS — J80 Acute respiratory distress syndrome: Secondary | ICD-10-CM | POA: Diagnosis not present

## 2019-11-08 DIAGNOSIS — M6281 Muscle weakness (generalized): Secondary | ICD-10-CM | POA: Diagnosis not present

## 2019-11-08 DIAGNOSIS — R2689 Other abnormalities of gait and mobility: Secondary | ICD-10-CM | POA: Diagnosis not present

## 2019-11-08 DIAGNOSIS — M2550 Pain in unspecified joint: Secondary | ICD-10-CM | POA: Diagnosis not present

## 2019-11-08 DIAGNOSIS — M25869 Other specified joint disorders, unspecified knee: Secondary | ICD-10-CM | POA: Diagnosis not present

## 2019-11-08 DIAGNOSIS — J1282 Pneumonia due to coronavirus disease 2019: Secondary | ICD-10-CM | POA: Diagnosis not present

## 2019-11-08 DIAGNOSIS — K219 Gastro-esophageal reflux disease without esophagitis: Secondary | ICD-10-CM | POA: Diagnosis not present

## 2019-11-08 DIAGNOSIS — U071 COVID-19: Secondary | ICD-10-CM | POA: Diagnosis not present

## 2019-11-08 DIAGNOSIS — Z7401 Bed confinement status: Secondary | ICD-10-CM | POA: Diagnosis not present

## 2019-11-08 LAB — BASIC METABOLIC PANEL
Anion gap: 10 (ref 5–15)
BUN: 67 mg/dL — ABNORMAL HIGH (ref 8–23)
CO2: 21 mmol/L — ABNORMAL LOW (ref 22–32)
Calcium: 8.9 mg/dL (ref 8.9–10.3)
Chloride: 105 mmol/L (ref 98–111)
Creatinine, Ser: 1.77 mg/dL — ABNORMAL HIGH (ref 0.44–1.00)
GFR calc Af Amer: 31 mL/min — ABNORMAL LOW (ref >60)
GFR calc non Af Amer: 27 mL/min — ABNORMAL LOW (ref >60)
Glucose, Bld: 142 mg/dL — ABNORMAL HIGH (ref 70–99)
Potassium: 4.9 mmol/L (ref 3.5–5.1)
Sodium: 136 mmol/L (ref 135–145)

## 2019-11-08 LAB — CBC WITH DIFFERENTIAL/PLATELET
Abs Immature Granulocytes: 0.1 10*3/uL — ABNORMAL HIGH (ref 0.00–0.07)
Basophils Absolute: 0 10*3/uL (ref 0.0–0.1)
Basophils Relative: 0 %
Eosinophils Absolute: 0 10*3/uL (ref 0.0–0.5)
Eosinophils Relative: 0 %
HCT: 37.4 % (ref 36.0–46.0)
Hemoglobin: 12.6 g/dL (ref 12.0–15.0)
Immature Granulocytes: 1 %
Lymphocytes Relative: 14 %
Lymphs Abs: 2 10*3/uL (ref 0.7–4.0)
MCH: 24.5 pg — ABNORMAL LOW (ref 26.0–34.0)
MCHC: 33.7 g/dL (ref 30.0–36.0)
MCV: 72.6 fL — ABNORMAL LOW (ref 80.0–100.0)
Monocytes Absolute: 1.2 10*3/uL — ABNORMAL HIGH (ref 0.1–1.0)
Monocytes Relative: 8 %
Neutro Abs: 11.2 10*3/uL — ABNORMAL HIGH (ref 1.7–7.7)
Neutrophils Relative %: 77 %
Platelets: 206 10*3/uL (ref 150–400)
RBC: 5.15 MIL/uL — ABNORMAL HIGH (ref 3.87–5.11)
RDW: 13.3 % (ref 11.5–15.5)
WBC: 14.4 10*3/uL — ABNORMAL HIGH (ref 4.0–10.5)
nRBC: 0.4 % — ABNORMAL HIGH (ref 0.0–0.2)

## 2019-11-08 LAB — GLUCOSE, CAPILLARY
Glucose-Capillary: 130 mg/dL — ABNORMAL HIGH (ref 70–99)
Glucose-Capillary: 157 mg/dL — ABNORMAL HIGH (ref 70–99)

## 2019-11-08 MED ORDER — GUAIFENESIN-DM 100-10 MG/5ML PO SYRP
5.0000 mL | ORAL_SOLUTION | ORAL | 0 refills | Status: DC | PRN
Start: 2019-11-08 — End: 2020-06-08

## 2019-11-08 MED ORDER — SODIUM BICARBONATE 650 MG PO TABS: 650.0000 mg | ORAL_TABLET | Freq: Two times a day (BID) | ORAL | Status: DC

## 2019-11-08 MED ORDER — PREDNISONE 50 MG PO TABS
50.0000 mg | ORAL_TABLET | Freq: Every day | ORAL | Status: AC
Start: 2019-11-09 — End: 2019-11-14

## 2019-11-08 MED ORDER — SODIUM ZIRCONIUM CYCLOSILICATE 5 G PO PACK: 5.0000 g | PACK | Freq: Every day | ORAL | Status: DC

## 2019-11-08 MED ORDER — ISOSORBIDE MONONITRATE ER 30 MG PO TB24: 30.0000 mg | ORAL_TABLET | Freq: Every day | ORAL | 3 refills | Status: DC

## 2019-11-08 MED ORDER — GABAPENTIN 100 MG PO CAPS: 100.0000 mg | ORAL_CAPSULE | Freq: Two times a day (BID) | ORAL | Status: DC

## 2019-11-08 NOTE — Progress Notes (Signed)
Report called to [facility] at [place]

## 2019-11-08 NOTE — Progress Notes (Signed)
Attempted to call report to [facility] x 2, no answer.

## 2019-11-08 NOTE — TOC Progression Note (Addendum)
Transition of Care [facility]) - Progression Note    Patient Details  Name: Brittany Roberts MRN: [id number] Date of Birth: 08-11-1940  Transition of Care [facility] CM/SW Contact  Purcell Mouton, RN Phone Number: 11/08/2019, 12:52 PM  Clinical Narrative:    Hulen Skains Daughter [name] [phone] concerning pt discharged to [facility]. [name] asked if RN would call her when pt is being transported. [facility] was called. RN is aware.         Expected Discharge Plan and Services           Expected Discharge Date: 11/08/19                                     Social Determinants of Health (SDOH) Interventions    Readmission Risk Interventions No flowsheet data found.

## 2019-11-08 NOTE — Discharge Summary (Addendum)
Physician Discharge Summary  Brittany Roberts QMV:[id number] DOB: 1940/08/02 DOA: 11/01/2019  PCP: [name], [name], MD  Admit date: 11/01/2019 Discharge date: 11/08/2019  Admitted From: Home Disposition:  SNF  Discharge Condition:Stable CODE STATUS:DNR Diet recommendation: Carb Modified  Brief/Interim Summary: Patient is a 79 y.o.femalewith past medical history of bipolar disorder, congestive heart failure, coronary artery disease, depression, fibromyalgia, dementia, GERD, hypothyroidism, hypertension,diabetes mellitus, chronic kidney disease presented to the hospital with complaints of fatigue,weakness and shortness of breath. Patient does have underlying dementia but has been getting little worse over the last few days.as per the report, her granddaughter had recently visited after which she started having some dry cough, throat irritation and dyspnea .She complainedmostly of congestion in her chest. In the ED, patient noted to be hypoxic and required 2 L of nasal cannula oxygen. BUN was 40 with a creatinine of 2.2.Chest x-ray showed some congestive findings. COVID 19 PCR was positive.Patient was started on remdesivir and Decadron.  Hospital course remarkable for significant wheezing which improved with steroids. Currently maintaining her saturation on room air.  PT/OT recommended skilled nursing  facility.   She finished remdesivir course.Stable for discharge to skilled nursing facility.  Following problems were addressed during her hospitalization:  Pneumonia secondary to COVID-19 with acute hypoxic respiratory failure :She was started on   remdesivir IV, dexamethasone IV. She completed the course of remdesivir.  Currently she is maintaining her saturation on room air .Inflammatory biomarkers- trendeddown. Chest x-ray showed evidence of vascular congestion but no consolidation.   History of COPD. Hospital course remarkable for severe wheezing which improved with steroids.   Continue steroids for 5 more days.  GERD. Continue PPI.  Type 2 diabetes mellitus with neuropathy. Continue home regimen  Essential hypertension.Continue home regimen  Hyperlipidemia. Continue Lipitor.  Mild acute kidney injury on chronic kidney disease likely stage IIIa.Creatinine of 1.4 ,oneyear back. Creatinine of 1.7 today. We have decreased the dose of gabapentinfor renal dysfunction. Resume  Lasix. Check Bmp in a week.  Hyperkalemia:Persistently hyperkalemic.  This is most likely associated with her chronic kidney disease, type IV renal tubular acidosis .  continue Lokelma daily.  Started on sodium bicarb tablet.   Dementia.Continue memantine.  Debility. Has a walker at home. Lives alone. Has Aide coming to the house.PT recommending SnF  Mild thrombocytopenia.Stable  Sleep apnea: uses oxygen at night. Uses cpap.   Discharge Diagnoses:  Principal Problem:   Pneumonia due to COVID-19 virus Active Problems:   Hypothyroidism   Hypercholesteremia   Morbid obesity ([place])   COPD (chronic obstructive pulmonary disease) ([place])   GERD   Controlled type 2 diabetes with neuropathy ([place])   Respiratory failure ([place])    Discharge Instructions  Discharge Instructions    Diet - low sodium heart healthy   Complete by: As directed    Discharge instructions   Complete by: As directed    1)Please take prescribed medications as instructed. 2)Do a CBC and BMP test in a week. 3)Infection Prevention Recommendations for Individuals Confirmed to have, or Being Evaluated for, 2019 Novel Coronavirus (COVID-19) Infection Who Receive Care at Home  Individuals who are confirmed to have, or are being evaluated for, COVID-19 should follow the prevention steps below until a healthcare provider or local or state health department says they can return to normal activities.  Stay home except to get medical care You should restrict activities outside your home, except for  getting medical care. Do not go to work, school, or public areas, and do  not use public transportation or taxis.  Call ahead before visiting your doctor Before your medical appointment, call the healthcare provider and tell them that you have, or are being evaluated for, COVID-19 infection. This will help the healthcare provider's office take steps to keep other people from getting infected. Ask your healthcare provider to call the local or state health department.  Monitor your symptoms Seek prompt medical attention if your illness is worsening (e.g., difficulty breathing). Before going to your medical appointment, call the healthcare provider and tell them that you have, or are being evaluated for, COVID-19 infection. Ask your healthcare provider to call the local or state health department.  Wear a facemask You should wear a facemask that covers your nose and mouth when you are in the same room with other people and when you visit a healthcare provider. People who live with or visit you should also wear a facemask while they are in the same room with you.  Separate yourself from other people in your home As much as possible, you should stay in a different room from other people in your home. Also, you should use a separate bathroom, if available.  Avoid sharing household items You should not share dishes, drinking glasses, cups, eating utensils, towels, bedding, or other items with other people in your home. After using these items, you should wash them thoroughly with soap and water.  Cover your coughs and sneezes Cover your mouth and nose with a tissue when you cough or sneeze, or you can cough or sneeze into your sleeve. Throw used tissues in a lined trash can, and immediately wash your hands with soap and water for at least 20 seconds or use an alcohol-based hand rub.  Wash your [facility] your hands often and thoroughly with soap and water for at least 20 seconds. You  can use an alcohol-based hand sanitizer if soap and water are not available and if your hands are not visibly dirty. Avoid touching your eyes, nose, and mouth with unwashed hands.   Prevention Steps for Caregivers and Household Members of Individuals Confirmed to have, or Being Evaluated for, COVID-19 Infection Being Cared for in the Home  If you live with, or provide care at home for, a person confirmed to have, or being evaluated for, COVID-19 infection please follow these guidelines to prevent infection:  Follow healthcare provider's instructions Make sure that you understand and can help the patient follow any healthcare provider instructions for all care.  Provide for the patient's basic needs You should help the patient with basic needs in the home and provide support for getting groceries, prescriptions, and other personal needs.  Monitor the patient's symptoms If they are getting sicker, call his or her medical provider and tell them that the patient has, or is being evaluated for, COVID-19 infection. This will help the healthcare provider's office take steps to keep other people from getting infected. Ask the healthcare provider to call the local or state health department.  Limit the number of people who have contact with the patient If possible, have only one caregiver for the patient. Other household members should stay in another home or place of residence. If this is not possible, they should stay in another room, or be separated from the patient as much as possible. Use a separate bathroom, if available. Restrict visitors who do not have an essential need to be in the home.  Keep older adults, very young children, and other sick people away from  the patient Keep older adults, very young children, and those who have compromised immune systems or chronic health conditions away from the patient. This includes people with chronic heart, lung, or kidney conditions,  diabetes, and cancer.  Ensure good ventilation Make sure that shared spaces in the home have good air flow, such as from an air conditioner or an opened window, weather permitting.  Wash your hands often Wash your hands often and thoroughly with soap and water for at least 20 seconds. You can use an alcohol based hand sanitizer if soap and water are not available and if your hands are not visibly dirty. Avoid touching your eyes, nose, and mouth with unwashed hands. Use disposable paper towels to dry your hands. If not available, use dedicated cloth towels and replace them when they become wet.  Wear a facemask and gloves Wear a disposable facemask at all times in the room and gloves when you touch or have contact with the patient's blood, body fluids, and/or secretions or excretions, such as sweat, saliva, sputum, nasal mucus, vomit, urine, or feces. Ensure the mask fits over your nose and mouth tightly, and do not touch it during use. Throw out disposable facemasks and gloves after using them. Do not reuse. Wash your hands immediately after removing your facemask and gloves. If your personal clothing becomes contaminated, carefully remove clothing and launder. Wash your hands after handling contaminated clothing. Place all used disposable facemasks, gloves, and other waste in a lined container before disposing them with other household waste. Remove gloves and wash your hands immediately after handling these items.  Do not share dishes, glasses, or other household items with the patient Avoid sharing household items. You should not share dishes, drinking glasses, cups, eating utensils, towels, bedding, or other items with a patient who is confirmed to have, or being evaluated for, COVID-19 infection. After the person uses these items, you should wash them thoroughly with soap and water.  Wash laundry thoroughly Immediately remove and wash clothes or bedding that have blood, body  fluids, and/or secretions or excretions, such as sweat, saliva, sputum, nasal mucus, vomit, urine, or feces, on them. Wear gloves when handling laundry from the patient. Read and follow directions on labels of laundry or clothing items and detergent. In general, wash and dry with the warmest temperatures recommended on the label.  Clean all areas the individual has used often Clean all touchable surfaces, such as counters, tabletops, doorknobs, bathroom fixtures, toilets, phones, keyboards, tablets, and bedside tables, every day. Also, clean any surfaces that may have blood, body fluids, and/or secretions or excretions on them. Wear gloves when cleaning surfaces the patient has come in contact with. Use a diluted bleach solution (e.g., dilute bleach with 1 part bleach and 10 parts water) or a household disinfectant with a label that says EPA-registered for coronaviruses. To make a bleach solution at home, add 1 tablespoon of bleach to 1 quart (4 cups) of water. For a larger supply, add  cup of bleach to 1 gallon (16 cups) of water. Read labels of cleaning products and follow recommendations provided on product labels. Labels contain instructions for safe and effective use of the cleaning product including precautions you should take when applying the product, such as wearing gloves or eye protection and making sure you have good ventilation during use of the product. Remove gloves and wash hands immediately after cleaning.  Monitor yourself for signs and symptoms of illness Caregivers and household members are considered close contacts, should  monitor their health, and will be asked to limit movement outside of the home to the extent possible. Follow the monitoring steps for close contacts listed on the symptom monitoring form.    Increase activity slowly   Complete by: As directed      Allergies as of 11/08/2019      Reactions   Ace Inhibitors Other (See Comments)   Coincided with sig bump  in creat. Retried and creat bumped again. Coincided with sig bump in creat. Retried and creat bumped again. Coincided with sig bump in creat. Retried and creat bumped again. Other reaction(s): Other (See Comments) Coincided with sig bump in creat. Retried and creat bumped again. Coincided with sig bump in creat. Retried and creat bumped again.   Isosorb Dinitrate-hydralazine Other (See Comments)   Sleep all the time. Sleep all the time. Other reaction(s): Other (See Comments) Sleep all the time.   Penicillins Anaphylaxis, Swelling, Rash   Has patient had a PCN reaction causing immediate rash, facial/tongue/throat swelling, SOB or lightheadedness with hypotension: Yes Has patient had a PCN reaction causing severe rash involving mucus membranes or skin necrosis: Yes Has patient had a PCN reaction that required hospitalization: Yes Has patient had a PCN reaction occurring within the last 10 years: No If all of the above answers are "NO", then may proceed with Cephalosporin use.   Buspirone Other (See Comments)   pain pain pain Other reaction(s): Other (See Comments) pain pain   Pregabalin Swelling   Ropinirole Hydrochloride Swelling   Amantadines Rash   "Swelling of the tongue"      Medication List    STOP taking these medications   potassium chloride 10 MEQ tablet Commonly known as: KLOR-CON     TAKE these medications   Accu-Chek Aviva Plus test strip Generic drug: glucose blood Use to test blood sugar three times daily   Accu-Chek Softclix Lancets lancets Use as instructed   acetaminophen 500 MG tablet Commonly known as: TYLENOL Take 500 mg by mouth every 6 (six) hours as needed for moderate pain.   AeroChamber Plus inhaler Use as instructed to use with inahaler.   allopurinol 100 MG tablet Commonly known as: ZYLOPRIM Take 1 tablet (100 mg total) by mouth daily.   amLODipine 5 MG tablet Commonly known as: NORVASC Take 1 tablet (5 mg total) by mouth daily.    aspirin EC 81 MG tablet Take 81 mg by mouth daily.   atorvastatin 20 MG tablet Commonly known as: LIPITOR Take 1 tablet (20 mg total) by mouth daily.   B-D ULTRAFINE III SHORT PEN 31G X 8 MM Misc Generic drug: Insulin Pen Needle CHECK BLOOD SUGAR 3 TIMES A DAY   budesonide-formoterol 80-4.5 MCG/ACT inhaler Commonly known as: SYMBICORT Inhale 2 puffs into the lungs 2 (two) times daily. What changed:   when to take this  reasons to take this   DULoxetine 60 MG capsule Commonly known as: Cymbalta Take 1 capsule (60 mg total) by mouth daily.   ferrous sulfate 325 (65 FE) MG tablet Take 325 mg by mouth daily with breakfast.   Fish Oil 1000 MG Caps Take 1 capsule by mouth daily.   furosemide 40 MG tablet Commonly known as: LASIX Take 40 mg by mouth daily.   gabapentin 100 MG capsule Commonly known as: NEURONTIN Take 1 capsule (100 mg total) by mouth 2 (two) times daily. What changed:   medication strength  how much to take   guaiFENesin-dextromethorphan 100-10 MG/5ML syrup Commonly known as:  ROBITUSSIN DM Take 5 mLs by mouth every 4 (four) hours as needed for cough.   HumuLIN N KwikPen 100 UNIT/ML Kiwkpen Generic drug: Insulin NPH (Human) ([place]) ADMINISTER 30 UNITS UNDER THE SKIN EVERY MORNING What changed: See the new instructions.   isosorbide mononitrate 30 MG 24 hr tablet Commonly known as: IMDUR Take 1 tablet (30 mg total) by mouth daily.   levETIRAcetam 500 MG tablet Commonly known as: KEPPRA Take 1 tablet (500 mg total) by mouth 2 (two) times daily.   linaclotide 145 MCG Caps capsule Commonly known as: LINZESS Take 1 capsule (145 mcg total) by mouth daily before breakfast. What changed:   when to take this  reasons to take this   memantine 10 MG tablet Commonly known as: Namenda Take 1 tablet (10 mg total) by mouth 2 (two) times daily.   metoprolol succinate 50 MG 24 hr tablet Commonly known as: TOPROL-XL Take 1 tablet (50 mg total) by  mouth daily. Take with or immediately following a meal.   montelukast 10 MG tablet Commonly known as: SINGULAIR Take 1 tablet (10 mg total) by mouth at bedtime.   pantoprazole 40 MG tablet Commonly known as: PROTONIX TAKE 1 TABLET(40 MG) BY MOUTH DAILY What changed: See the new instructions.   predniSONE 50 MG tablet Commonly known as: DELTASONE Take 1 tablet (50 mg total) by mouth daily for 5 days. Start taking on: Nov 09, 2019   ProAir HFA 108 (90 Base) MCG/ACT inhaler Generic drug: albuterol INHALE 2 PUFF BY MOUTH EVERY 4 HOURS AS NEEDED USE ONLY IF YOU ARE WHEEZING   sodium bicarbonate 650 MG tablet Take 1 tablet (650 mg total) by mouth 2 (two) times daily.   sodium zirconium cyclosilicate 5 g packet Commonly known as: LOKELMA Take 5 g by mouth daily. Start taking on: Nov 09, 2019   Vitamin B Complex Tabs Take 1 tablet by mouth daily.   VITAMIN C PO Take 1 tablet by mouth daily.   Vitamin D3 125 MCG (5000 UT) Caps Take 5,000 Units by mouth daily.      Follow-up Information    [name], [name], MD. Schedule an appointment as soon as possible for a visit in 2 week(s).   Specialty: Family Medicine Contact information: [address] [place] [address] [phone]          Allergies  Allergen Reactions  . Ace Inhibitors Other (See Comments)    Coincided with sig bump in creat. Retried and creat bumped again. Coincided with sig bump in creat. Retried and creat bumped again. Coincided with sig bump in creat. Retried and creat bumped again. Other reaction(s): Other (See Comments) Coincided with sig bump in creat. Retried and creat bumped again. Coincided with sig bump in creat. Retried and creat bumped again.  . Isosorb Dinitrate-Hydralazine Other (See Comments)    Sleep all the time. Sleep all the time. Other reaction(s): Other (See Comments) Sleep all the time.  Marland Kitchen Penicillins Anaphylaxis, Swelling and Rash    Has patient had a PCN reaction  causing immediate rash, facial/tongue/throat swelling, SOB or lightheadedness with hypotension: Yes Has patient had a PCN reaction causing severe rash involving mucus membranes or skin necrosis: Yes Has patient had a PCN reaction that required hospitalization: Yes Has patient had a PCN reaction occurring within the last 10 years: No If all of the above answers are "NO", then may proceed with Cephalosporin use.   Marland Kitchen Buspirone Other (See Comments)    pain pain pain  Other reaction(s): Other (See Comments) pain pain  . Pregabalin Swelling  . Ropinirole Hydrochloride Swelling  . Amantadines Rash    "Swelling of the tongue"    Consultations:  None   Procedures/Studies: DG Chest 1 View  Result Date: 11/05/2019 CLINICAL DATA:  Wheezing, COVID positive EXAM: CHEST  1 VIEW COMPARISON:  11/01/2019 FINDINGS: Hazy right basilar airspace disease. Mild bilateral interstitial thickening. No pleural effusion or pneumothorax. Stable cardiomegaly. No acute osseous abnormality. IMPRESSION: Hazy right basilar airspace disease concerning for pneumonia. Mild bilateral interstitial thickening may reflect mild interstitial edema versus infection. Electronically Signed   By: Kathreen Devoid   On: 11/05/2019 13:38   CT Head Wo Contrast  Result Date: 11/01/2019 CLINICAL DATA:  Generalized weakness, altered mental status EXAM: CT HEAD WITHOUT CONTRAST TECHNIQUE: Contiguous axial images were obtained from the base of the skull through the vertex without intravenous contrast. COMPARISON:  01/13/2017 FINDINGS: Brain: No evidence of acute infarction, hemorrhage, hydrocephalus, extra-axial collection or mass lesion/mass effect. Stable right frontal encephalomalacia. Scattered low-density changes within the periventricular and subcortical white matter compatible with chronic microvascular ischemic change. Mild diffuse cerebral volume loss. Vascular: Atherosclerotic calcifications involving the large vessels of the skull  base. No unexpected hyperdense vessel. Skull: Normal. Negative for fracture or focal lesion. Sinuses/Orbits: Stable postsurgical changes to the right maxillary sinus. Remaining paranasal sinuses and mastoid air cells are clear. Orbital structures unremarkable. Other: None. IMPRESSION: 1. No acute intracranial findings. 2. Stable right frontal encephalomalacia and chronic microvascular ischemic changes of the white matter. Electronically Signed   By: Davina Poke D.O.   On: 11/01/2019 13:47   DG Chest Port 1 View  Result Date: 11/01/2019 CLINICAL DATA:  Fever hypoxia EXAM: PORTABLE CHEST 1 VIEW COMPARISON:  12/13/2017 FINDINGS: Cardiomediastinal contours remain enlarged with central pulmonary vascular engorgement. Mild pulmonary edema likely present as well with subtle septal lines at the periphery. RIGHT hemidiaphragm remains slightly elevated. Node dense consolidation. Improved aeration at the RIGHT lung base compared to the previous exam. Visualized skeletal structures are unremarkable. IMPRESSION: 1. Findings suggestive of mild pulmonary edema. 2. Improved aeration at the RIGHT lung base compared to the previous exam likely with atelectasis on today's study. 3. Stable enlargement of the cardiomediastinal contours. Electronically Signed   By: Zetta Bills M.D.   On: 11/01/2019 11:51      Subjective: Patient seen and examined at the bedside this morning.  Hemodynamically stable for discharge today.  Discharge Exam: Vitals:   11/07/19 2135 11/08/19 0518  BP: (!) 132/93 (!) 150/82  Pulse: (!) 59 62  Resp: 20 20  Temp: (!) 97.5 F (36.4 C) 97.7 F (36.5 C)  SpO2: 97% 96%   Vitals:   11/07/19 1435 11/07/19 2135 11/08/19 0518 11/08/19 0530  BP: 139/80 (!) 132/93 (!) 150/82   Pulse: 65 (!) 59 62   Resp: (!) 24 20 20    Temp: (!) 97.5 F (36.4 C) (!) 97.5 F (36.4 C) 97.7 F (36.5 C)   TempSrc: Oral Oral Oral   SpO2: 93% 97% 96%   Weight:    126.6 kg  Height:        General: Pt  is alert, awake, not in acute distress Cardiovascular: RRR, S1/S2 +, no rubs, no gallops Respiratory: CTA bilaterally, no wheezing, no rhonchi Abdominal: Soft, NT, ND, bowel sounds + Extremities: no edema, no cyanosis    The results of significant diagnostics from this hospitalization (including imaging, microbiology, ancillary and laboratory) are listed below for reference.  Microbiology: Recent Results (from the past 240 hour(s))  Blood Culture (routine x 2)     Status: None   Collection Time: 11/01/19 11:20 AM   Specimen: BLOOD  Result Value Ref Range Status   Specimen Description   Final    BLOOD LEFT WRIST Performed at [facility] [address], [place], [place] [address]    Special Requests   Final    BOTTLES DRAWN AEROBIC AND ANAEROBIC Blood Culture results may not be optimal due to an inadequate volume of blood received in culture bottles Performed at [facility] [address] [place], [place] [address]    Culture   Final    NO GROWTH 5 DAYS Performed at [facility], [place] [address], [place], [place] [address]    Report Status 11/06/2019 FINAL  Final  Urine culture     Status: None   Collection Time: 11/01/19 12:18 PM   Specimen: In/Out Cath Urine  Result Value Ref Range Status   Specimen Description   Final    IN/OUT CATH URINE Performed at [facility] [address], [place], [place] [address]    Special Requests   Final    NONE Performed at [facility], [place] [address] [place], [place] [address]    Culture   Final    NO GROWTH Performed at [facility], [place] [address], [place], [place] [address]    Report Status 11/02/2019 FINAL  Final     Labs: BNP (last 3 results) Recent Labs    11/01/19 1120 11/02/19 0626  BNP 111.4* 17.6   Basic Metabolic Panel: Recent Labs  Lab 11/02/19 0627 11/02/19 1130 11/03/19 0500 11/03/19 0830 11/04/19 0500  11/04/19 0500 11/05/19 0531 11/06/19 0416 11/07/19 0340 11/07/19 1441 11/08/19 0321  NA 136  --  136   < > 138   < > 135 134* 134* 136 136  K 6.0*   < > 5.8*   < > 5.4*   < > 5.0 5.4* 5.7* 5.0 4.9  CL 107  --  110   < > 112*   < > 107 108 107 106 105  CO2 21*  --  20*   < > 20*   < > 20* 18* 18* 23 21*  GLUCOSE 213*  --  232*   < > 224*   < > 237* 291* 248* 299* 142*  BUN 57*  --  60*   < > 65*   < > 63* 60* 68* 65* 67*  CREATININE 2.04*  --  1.97*   < > 1.94*   < > 1.61* 1.75* 1.88* 1.85* 1.77*  CALCIUM 8.4*  --  8.6*   < > 8.9   < > 9.0 9.2 9.3 9.4 8.9  MG 2.2  --  2.1  --  2.3  --  2.3 2.2  --   --   --   PHOS  --   --   --   --  3.3  --   --   --   --   --   --    < > = values in this interval not displayed.   Liver Function Tests: Recent Labs  Lab 11/02/19 0627 11/03/19 0500 11/04/19 0500 11/05/19 0531 11/06/19 0416  AST 60* 59* 52* 53* 50*  ALT 32 33 31 38 42  ALKPHOS 78 75 79 77 81  BILITOT 0.6 0.7 0.5 0.7 0.7  PROT  8.1 7.5 7.7 8.0 8.1  ALBUMIN 3.0* 2.8* 3.0* 3.2* 3.2*   No results for input(s): LIPASE, AMYLASE in the last 168 hours. No results for input(s): AMMONIA in the last 168 hours. CBC: Recent Labs  Lab 11/03/19 0500 11/04/19 0500 11/05/19 0531 11/06/19 0416 11/08/19 0321  WBC 6.7 9.6 9.0 8.5 14.4*  NEUTROABS 4.8 7.7 6.7 6.3 11.2*  HGB 12.4 12.0 12.7 13.0 12.6  HCT 37.4 36.0 37.6 38.2 37.4  MCV 74.8* 74.1* 73.9* 72.6* 72.6*  PLT 142* 168 170 194 206   Cardiac Enzymes: No results for input(s): CKTOTAL, CKMB, CKMBINDEX, TROPONINI in the last 168 hours. BNP: Invalid input(s): POCBNP CBG: Recent Labs  Lab 11/07/19 1129 11/07/19 1648 11/07/19 2132 11/08/19 0727 11/08/19 1124  GLUCAP 389* 122* 109* 130* 157*   D-Dimer Recent Labs    11/06/19 0416  DDIMER 0.81*   Hgb A1c No results for input(s): HGBA1C in the last 72 hours. Lipid Profile No results for input(s): CHOL, HDL, LDLCALC, TRIG, CHOLHDL, LDLDIRECT in the last 72 hours. Thyroid  function studies No results for input(s): TSH, T4TOTAL, T3FREE, THYROIDAB in the last 72 hours.  Invalid input(s): FREET3 Anemia work up Recent Labs    11/06/19 0416  FERRITIN 658*   Urinalysis    Component Value Date/Time   COLORURINE YELLOW 11/01/2019 [place] (A) 11/01/2019 1218   LABSPEC 1.013 11/01/2019 1218   PHURINE 5.0 11/01/2019 [place] 11/01/2019 [place] NEGATIVE 11/01/2019 1218   HGBUR negative 02/26/2009 1314   BILIRUBINUR NEGATIVE 11/01/2019 1218   BILIRUBINUR NEG 04/05/2014 1455   KETONESUR NEGATIVE 11/01/2019 1218   PROTEINUR 100 (A) 11/01/2019 1218   UROBILINOGEN 0.2 08/01/2014 2305   NITRITE NEGATIVE 11/01/2019 1218   LEUKOCYTESUR NEGATIVE 11/01/2019 1218   Sepsis Labs Invalid input(s): PROCALCITONIN,  WBC,  LACTICIDVEN Microbiology Recent Results (from the past 240 hour(s))  Blood Culture (routine x 2)     Status: None   Collection Time: 11/01/19 11:20 AM   Specimen: BLOOD  Result Value Ref Range Status   Specimen Description   Final    BLOOD LEFT WRIST Performed at [facility], [place] [address] [place], [place] [address]    Special Requests   Final    BOTTLES DRAWN AEROBIC AND ANAEROBIC Blood Culture results may not be optimal due to an inadequate volume of blood received in culture bottles Performed at [place] [address] [place], [place] [address]    Culture   Final    NO GROWTH 5 DAYS Performed at [facility], [place] [address] [place], [place] [address]    Report Status 11/06/2019 FINAL  Final  Urine culture     Status: None   Collection Time: 11/01/19 12:18 PM   Specimen: In/Out Cath Urine  Result Value Ref Range Status   Specimen Description   Final    IN/OUT CATH URINE Performed at [facility] [address] [place], [place] [address]    Special Requests   Final    NONE Performed at [facility], [place]  [address], [place], [place] [address]    Culture   Final    NO GROWTH Performed at [facility], [place] [address], [place], [place] [address]    Report Status 11/02/2019 FINAL  Final    Please note: You were cared for by a hospitalist during your hospital stay. Once you are discharged, your  primary care physician will handle any further medical issues. Please note that NO REFILLS for any discharge medications will be authorized once you are discharged, as it is imperative that you return to your primary care physician (or establish a relationship with a primary care physician if you do not have one) for your post hospital discharge needs so that they can reassess your need for medications and monitor your lab values.    Time coordinating discharge: 40 minutes  SIGNED:   Shelly Coss, MD  [facility] 11/08/2019, 11:39 AM Pager [phone]  If 7PM-7AM, please contact night-coverage [email or web address] Password [email or web address]

## 2019-11-12 DIAGNOSIS — J9601 Acute respiratory failure with hypoxia: Secondary | ICD-10-CM | POA: Diagnosis not present

## 2019-11-12 DIAGNOSIS — K219 Gastro-esophageal reflux disease without esophagitis: Secondary | ICD-10-CM | POA: Diagnosis not present

## 2019-11-12 DIAGNOSIS — U071 COVID-19: Secondary | ICD-10-CM | POA: Diagnosis not present

## 2019-11-12 DIAGNOSIS — J1282 Pneumonia due to coronavirus disease 2019: Secondary | ICD-10-CM | POA: Diagnosis not present

## 2019-11-13 ENCOUNTER — Telehealth: Payer: Self-pay | Admitting: Family Medicine

## 2019-11-13 NOTE — Telephone Encounter (Signed)
Pt's daughter, [name], is requesting to speak to you regarding pt. Per [name], you spoke to her sister today and she has a question for you. [name], did not want to give me any information and requested a message be sent to you. Thanks

## 2019-11-13 NOTE — Telephone Encounter (Signed)
Spoke with patient's daughter and she stated that patient will need therapy started asap. Can do a virtual visit to get referrals placed for therapy? Please advise

## 2019-11-14 ENCOUNTER — Telehealth (INDEPENDENT_AMBULATORY_CARE_PROVIDER_SITE_OTHER): Payer: Medicare Other | Admitting: Family Medicine

## 2019-11-14 ENCOUNTER — Encounter: Payer: Self-pay | Admitting: Family Medicine

## 2019-11-14 VITALS — BP 110/63 | HR 77 | Ht 63.5 in

## 2019-11-14 DIAGNOSIS — U071 COVID-19: Secondary | ICD-10-CM | POA: Diagnosis not present

## 2019-11-14 DIAGNOSIS — E1140 Type 2 diabetes mellitus with diabetic neuropathy, unspecified: Secondary | ICD-10-CM

## 2019-11-14 DIAGNOSIS — J449 Chronic obstructive pulmonary disease, unspecified: Secondary | ICD-10-CM | POA: Diagnosis not present

## 2019-11-14 DIAGNOSIS — N179 Acute kidney failure, unspecified: Secondary | ICD-10-CM

## 2019-11-14 DIAGNOSIS — I110 Hypertensive heart disease with heart failure: Secondary | ICD-10-CM

## 2019-11-14 DIAGNOSIS — Z794 Long term (current) use of insulin: Secondary | ICD-10-CM

## 2019-11-14 DIAGNOSIS — J1282 Pneumonia due to coronavirus disease 2019: Secondary | ICD-10-CM

## 2019-11-14 MED ORDER — HUMULIN N KWIKPEN 100 UNIT/ML ~~LOC~~ SUPN: PEN_INJECTOR | SUBCUTANEOUS | 3 refills | Status: DC

## 2019-11-14 NOTE — Progress Notes (Addendum)
Virtual Visit via Video Note  I connected with Brittany Roberts on 11/14/19 by a video enabled telemedicine application and verified that I am speaking with the correct person using two identifiers.  Location patient: home Location provider:work office Persons participating in the virtual visit: patient, provider  I discussed the limitations of evaluation and management by telemedicine and the availability of in person appointments. The patient expressed understanding and agreed to proceed.  Chief Complaint  Patient presents with  . Hospitalization Follow-up    hospital follow-up for Covid, recently discharged from SNF    HPI: Brittany Roberts is a 79 years old female with history of hypertension, fibromyalgia, generalized OA, asthma/COPD, and CKD 3 who is following on recent hospitalization. She presented to the ER complaining of worsening dyspnea,worsening confusion, and fatigue. Diagnosed with COVID-19 pneumonia.She was treated with Remdesivir and dexamethasone. She was discharged on Prednisone x 5 days, which she completed. She was hospitalized on 11/01/2019. Discharge to SNF on 11/08/2019. Discharge home on 11/13/2019. She thinks labs were done at the SNF.  She has home health service to help with ADLs. She needs assistance for transfer (walker), bathing, and grooming. OT and PT have not been arranged. In general she feels "pretty good." Appetite is getting better.  She was discharged on supplemental O2 but she has not needed it, O2 sat this morning 97%. On supplemental oxygen at night with her CPAP, 2 LPM (her baseline is 2.5 LPM).  AKI/IV renal tubular acidosis: She did not start sodium bicarb as instructed. She has not noted decreased urine, foam in urine,or gross hematuria. HyperK+ 11/02/19 K+ was 6.0, e GFR 20-22.  HTN: Not checking BP regularly. She is on Amlodipine 5 mg daily,Imdur 30 mg daily,Metoprolol succinate 50 mg daily. Negative for headache,visual changes,unusual abdominal  pain,N/V,or worsening edema.  Lab Results  Component Value Date   CREATININE 1.77 (H) 11/08/2019   BUN 67 (H) 11/08/2019   NA 136 11/08/2019   K 4.9 11/08/2019   CL 105 11/08/2019   CO2 21 (L) 11/08/2019   Lab Results  Component Value Date   WBC 14.4 (H) 11/08/2019   HGB 12.6 11/08/2019   HCT 37.4 11/08/2019   MCV 72.6 (L) 11/08/2019   PLT 206 11/08/2019   Elevated D-Dimer elevated at 6.6, at the time of discharge it was 0.81. Negative for fever,chills,hemoptysis,SOB,CP,or palpitations.  DM II: Her daughter is not sure about dome of Humolin N dose. Dose was increased in 10/2019, she is on 30 U am and 10 U pm. Negative for polydipsia,polyuria, or polyphagia.  Lab Results  Component Value Date   HGBA1C 7.6 (H) 10/17/2019    ROS: See pertinent positives and negatives per HPI.  Past Medical History:  Diagnosis Date  . Acute delirium 01/14/2017  . Anxiety   . Arthritis    "all over"  . Asthma   . Bipolar disorder ([place])   . Complication of anesthesia    "w/right foot OR they gave me too much and they couldn't get me woke"  . Congestive heart failure, unspecified   . Coronary artery calcification seen on CAT scan 01/14/2017  . Coronary artery disease    "I've got 1 stent" (06/30/2015)  . Depression   . Discoid lupus   . Fibromyalgia    "I've been told I have this; can't take Lyrica cause I'm allergic to it" (06/30/2015)  . GERD (gastroesophageal reflux disease)   . History of gout   . History of hiatal hernia    "real  bad" (06/30/2015)  . Hypertension   . Hypothyroidism   . Memory loss   . On home oxygen therapy    "2L q hs" (06/30/2015)  . OSA (obstructive sleep apnea)    "just use my oxygen; no mask" (06/30/2015)  . Other and unspecified hyperlipidemia   . Penetrating foot wound    left nonhealing foot wound on the dorsal surface  . Peripheral neuropathy   . Pneumonia "many of times"  . Refusal of blood transfusions as patient is Jehovah's Witness   . Renal  failure, unspecified    "kidneys not working 100%" (06/30/2015)  . Seizure ([place])   . Sickle cell trait ([place])   . Systemic lupus ([place])   . Thoracic aortic atherosclerosis ([place]) 01/14/2017  . Type II diabetes mellitus ([place])   . Unspecified vitamin D deficiency     Past Surgical History:  Procedure Laterality Date  . ANKLE ARTHROSCOPY WITH OPEN REDUCTION INTERNAL FIXATION (ORIF) Right 03/2011  . CARDIAC CATHETERIZATION N/A 07/01/2015   Procedure: Left Heart Cath and Coronary Angiography;  Surgeon: Adrian Prows, MD;  Location: [facility];  Service: Cardiovascular;  Laterality: N/A;  . CARDIAC CATHETERIZATION N/A 07/01/2015   Procedure: Intravascular Pressure Wire/FFR Study;  Surgeon: Adrian Prows, MD;  Location: [facility];  Service: Cardiovascular;  Laterality: N/A;  . CARPAL TUNNEL RELEASE Bilateral   . CATARACT EXTRACTION W/ INTRAOCULAR LENS  IMPLANT, BILATERAL Bilateral   . CHOLECYSTECTOMY OPEN    . COLONOSCOPY N/A 12/10/2013   Procedure: COLONOSCOPY;  Surgeon: Inda Castle, MD;  Location: [facility];  Service: Endoscopy;  Laterality: N/A;  . FRACTURE SURGERY    . INCISION AND DRAINAGE ABSCESS Left    foot "under my little toe"  . KNEE LIGAMENT RECONSTRUCTION Right   . LEFT HEART CATHETERIZATION WITH CORONARY ANGIOGRAM N/A 08/08/2012   Procedure: LEFT HEART CATHETERIZATION WITH CORONARY ANGIOGRAM;  Surgeon: [name], MD;  Location: [facility] CATH LAB;  Service: Cardiovascular;  Laterality: N/A;  . NASAL SINUS SURGERY    . PERCUTANEOUS CORONARY STENT INTERVENTION (PCI-S) N/A 08/21/2012   Procedure: PERCUTANEOUS CORONARY STENT INTERVENTION (PCI-S);  Surgeon: [name], MD;  Location: [facility] CATH LAB;  Service: Cardiovascular;  Laterality: N/A;  . TUBAL LIGATION      Family History  Problem Relation Age of Onset  . Heart disease Mother   . Diabetes Mother   . Clotting disorder Mother   . Pneumonia Father   . Rheum arthritis Father   . Diabetes Sister   . Diabetes  Sister   . Asthma Brother   . Cancer Brother   . Kidney disease Brother   . Lupus Son   . Heart disease Son   . Heart disease Daughter   . Colon cancer Neg Hx     Social History   Socioeconomic History  . Marital status: Single    Spouse name: Not on file  . Number of children: 4  . Years of education: 3 th  . Highest education level: Not on file  Occupational History  . Occupation: Retired- Engineer, production    Employer: RETIRED  Tobacco Use  . Smoking status: Former Smoker    Packs/day: 3.00    Years: 40.00    Pack years: 120.00    Types: Cigarettes    Start date: 07/05/1960    Quit date: 07/05/1998    Years since quitting: 21.3  . Smokeless tobacco: Never Used  Substance and Sexual Activity  .  Alcohol use: No    Alcohol/week: 0.0 standard drinks    Comment: havent drank in over 40 years  . Drug use: No  . Sexual activity: Never  Other Topics Concern  . Not on file  Social History Narrative   Health Care POA:    Emergency Contact: daughter, Lorenso Courier [phone] or [name] [phone]   End of Life Plan: Does not want to be ventilated or feeding tubes.    Who lives with you: Lives alone   Any pets: none   Diet: Patient reports enjoying and eating junk food.  Does not regulate types of food and currently her dentures are broken so it is hard to eat some foods.   Exercise: Patient does not have an exercise plan.   Seatbelts: Patient reports wearing seatbelt when in vehicle.   Sun Exposure/Protection: Patient reports not going outside very often.   Hobbies: Patient enjoys reading the bible and watching game shows.    Right-handed.   1 cup caffeine per day.   Former smoker-stopped 2000   Alcohol none   Social Determinants of Radio broadcast assistant Strain:   . Difficulty of Paying Living Expenses:   Food Insecurity:   . Worried About Charity fundraiser in the Last Year:   . Arboriculturist in the Last Year:   Transportation Needs:   . Lexicographer (Medical):   Marland Kitchen Lack of Transportation (Non-Medical):   Physical Activity:   . Days of Exercise per Week:   . Minutes of Exercise per Session:   Stress:   . Feeling of Stress :   Social Connections:   . Frequency of Communication with Friends and Family:   . Frequency of Social Gatherings with Friends and Family:   . Attends Religious Services:   . Active Member of Clubs or Organizations:   . Attends Archivist Meetings:   Marland Kitchen Marital Status:   Intimate Partner Violence:   . Fear of Current or Ex-Partner:   . Emotionally Abused:   Marland Kitchen Physically Abused:   . Sexually Abused:     Current Outpatient Medications:  .  Accu-Chek Softclix Lancets lancets, Use as instructed, Disp: 100 each, Rfl: 3 .  acetaminophen (TYLENOL) 500 MG tablet, Take 500 mg by mouth every 6 (six) hours as needed for moderate pain., Disp: , Rfl:  .  allopurinol (ZYLOPRIM) 100 MG tablet, Take 1 tablet (100 mg total) by mouth daily., Disp: 90 tablet, Rfl: 2 .  amLODipine (NORVASC) 5 MG tablet, Take 1 tablet (5 mg total) by mouth daily., Disp: 90 tablet, Rfl: 3 .  Ascorbic Acid (VITAMIN C PO), Take 1 tablet by mouth daily., Disp: , Rfl:  .  aspirin EC 81 MG tablet, Take 81 mg by mouth daily. , Disp: , Rfl:  .  atorvastatin (LIPITOR) 20 MG tablet, Take 1 tablet (20 mg total) by mouth daily., Disp: 90 tablet, Rfl: 2 .  B Complex Vitamins (VITAMIN B COMPLEX) TABS, Take 1 tablet by mouth daily., Disp: , Rfl:  .  budesonide-formoterol (SYMBICORT) 80-4.5 MCG/ACT inhaler, Inhale 2 puffs into the lungs 2 (two) times daily. (Patient taking differently: Inhale 2 puffs into the lungs 2 (two) times daily as needed (sob/wheezing). ), Disp: 1 Inhaler, Rfl: 3 .  Cholecalciferol (VITAMIN D3) 5000 UNITS CAPS, Take 5,000 Units by mouth daily., Disp: , Rfl:  .  DULoxetine (CYMBALTA) 60 MG capsule, Take 1 capsule (60 mg total) by mouth daily., Disp: 90 capsule, Rfl:  2 .  ferrous sulfate 325 (65 FE) MG tablet, Take 325  mg by mouth daily with breakfast., Disp: , Rfl:  .  Fluocinolone Acetonide Scalp 0.01 % OIL, APPLY TO SCALP AT BEDTIME AS NEEDED, Disp: , Rfl:  .  furosemide (LASIX) 40 MG tablet, Take 40 mg by mouth daily. , Disp: , Rfl:  .  gabapentin (NEURONTIN) 100 MG capsule, Take 1 capsule (100 mg total) by mouth 2 (two) times daily., Disp:  , Rfl:  .  glucose blood (ACCU-CHEK AVIVA PLUS) test strip, Use to test blood sugar three times daily, Disp: 100 each, Rfl: 3 .  guaiFENesin-dextromethorphan (ROBITUSSIN DM) 100-10 MG/5ML syrup, Take 5 mLs by mouth every 4 (four) hours as needed for cough., Disp: 118 mL, Rfl: 0 .  Insulin NPH, Human,, Isophane, (HUMULIN N KWIKPEN) 100 UNIT/ML Kiwkpen, 30 U am and 10 U pm, Disp: 30 mL, Rfl: 3 .  Insulin Pen Needle (B-D ULTRAFINE III SHORT PEN) 31G X 8 MM MISC, CHECK BLOOD SUGAR 3 TIMES A DAY, Disp: 100 each, Rfl: 3 .  isosorbide mononitrate (IMDUR) 30 MG 24 hr tablet, Take 1 tablet (30 mg total) by mouth daily., Disp: 90 tablet, Rfl: 3 .  levETIRAcetam (KEPPRA) 500 MG tablet, Take 1 tablet (500 mg total) by mouth 2 (two) times daily., Disp: 180 tablet, Rfl: 3 .  linaclotide (LINZESS) 145 MCG CAPS capsule, Take 1 capsule (145 mcg total) by mouth daily before breakfast. (Patient taking differently: Take 145 mcg by mouth daily as needed (abdominal cramping). ), Disp: 90 capsule, Rfl: 1 .  memantine (NAMENDA) 10 MG tablet, Take 1 tablet (10 mg total) by mouth 2 (two) times daily., Disp: 60 tablet, Rfl: 11 .  metoprolol succinate (TOPROL-XL) 50 MG 24 hr tablet, Take 1 tablet (50 mg total) by mouth daily. Take with or immediately following a meal., Disp: 90 tablet, Rfl: 1 .  montelukast (SINGULAIR) 10 MG tablet, Take 1 tablet (10 mg total) by mouth at bedtime., Disp: 90 tablet, Rfl: 3 .  Omega-3 Fatty Acids (FISH OIL) 1000 MG CAPS, Take 1 capsule by mouth daily., Disp: , Rfl:  .  pantoprazole (PROTONIX) 40 MG tablet, TAKE 1 TABLET(40 MG) BY MOUTH DAILY (Patient taking differently:  Take 40 mg by mouth 2 (two) times daily. ), Disp: 90 tablet, Rfl: 3 .  predniSONE (DELTASONE) 50 MG tablet, Take 1 tablet (50 mg total) by mouth daily for 5 days., Disp: , Rfl:  .  PROAIR HFA 108 (90 Base) MCG/ACT inhaler, INHALE 2 PUFF BY MOUTH EVERY 4 HOURS AS NEEDED USE ONLY IF YOU ARE WHEEZING, Disp: 8.5 g, Rfl: 3 .  sodium bicarbonate 650 MG tablet, Take 1 tablet (650 mg total) by mouth 2 (two) times daily., Disp:  , Rfl:  .  sodium zirconium cyclosilicate (LOKELMA) 5 g packet, Take 5 g by mouth daily., Disp:  , Rfl:  .  Spacer/Aero-Holding Chambers (AEROCHAMBER PLUS) inhaler, Use as instructed to use with inahaler., Disp: 1 each, Rfl: 1  EXAM:  VITALS per patient if applicable:BP 224/82   Pulse 77   Ht 5' 3.5" (1.613 m)   SpO2 96%   BMI 48.67 kg/m   GENERAL: alert, oriented, appears well and in no acute distress. She is sitting on a couch comfortably.   HEENT: atraumatic, normocephalic, no obvious abnormalities on inspection.  LUNGS: on inspection no signs of respiratory distress, breathing rate appears normal, no obvious gross SOB, gasping or wheezing. Cough, productive x 1. Not on supplemental  O2.  CV: no obvious cyanosis  Brittany: moves all visible extremities without noticeable abnormality  PSYCH/NEURO: pleasant and cooperative, no obvious depression or anxiety, speech grossly intact. Oriented in person and place. She does not recall date (year 102).  ASSESSMENT AND PLAN:  Discussed the following assessment and plan: Orders Placed This Encounter  Procedures  . Ambulatory referral to Home Health    AKI (acute kidney injury) ([place]) Adequate hydration and BP control. Low salt diet recommended. Continue avoiding NSAIDs. We will try to obtain BMP done while she was in the SNF. Instructed about warning signs.  COPD (chronic obstructive pulmonary disease) (HCC) Acute symptoms improved. She is no longer requiring O2 supplementation during the day. Continue Symbicort 80-4.5  mcg 2 puff twice daily and albuterol inhaler 2 puffs every 4-6 hours as needed.  Hypertensive heart disease with heart failure (HCC) BP adequately controlled. No changes in current management. Low-salt diet recommended. Continue monitoring BP regularly.  Type 2 diabetes mellitus with diabetic neuropathy, unspecified (HCC) Continue Humulin and 30 units in the morning and 10 units at night to continue. We discussed the importance of following dietary recommendations. Continue monitoring BS's.   PT and OT will be arranged through [facility].  I discussed the assessment and treatment plan with the patient. Brittany Roberts,granddaugter,and daughter were provided an opportunity to ask questions and all were answered. The patient agreed with the plan and demonstrated an understanding of the instructions.   Return Keep next appt, 11/20/19.    Brittany Okray [name], MD

## 2019-11-14 NOTE — Telephone Encounter (Signed)
She was just discharged from SNF, so I think she just needs a verbal authorization. I have times this afternoon for a virtual visit (video). Thanks, BJ

## 2019-11-14 NOTE — Assessment & Plan Note (Addendum)
BP adequately controlled. No changes in current management. Low-salt diet recommended. Continue monitoring BP regularly.

## 2019-11-14 NOTE — Assessment & Plan Note (Signed)
Adequate hydration and BP control. Low salt diet recommended. Continue avoiding NSAIDs. We will try to obtain BMP done while she was in the SNF. Instructed about warning signs.

## 2019-11-14 NOTE — Assessment & Plan Note (Addendum)
Acute symptoms improved. She is no longer requiring O2 supplementation during the day. Continue Symbicort 80-4.5 mcg 2 puff twice daily and albuterol inhaler 2 puffs every 4-6 hours as needed.

## 2019-11-14 NOTE — Telephone Encounter (Signed)
Patient scheduled for my chart visit for this afternoon.

## 2019-11-14 NOTE — Assessment & Plan Note (Addendum)
Continue Humulin and 30 units in the morning and 10 units at night to continue. We discussed the importance of following dietary recommendations. Continue monitoring BS's.

## 2019-11-16 ENCOUNTER — Other Ambulatory Visit: Payer: Self-pay | Admitting: Family Medicine

## 2019-11-19 ENCOUNTER — Telehealth: Payer: Self-pay | Admitting: Family Medicine

## 2019-11-19 ENCOUNTER — Other Ambulatory Visit: Payer: Self-pay

## 2019-11-19 NOTE — Telephone Encounter (Signed)
Error not needed

## 2019-11-20 ENCOUNTER — Other Ambulatory Visit: Payer: Self-pay | Admitting: Family Medicine

## 2019-11-20 ENCOUNTER — Encounter: Payer: Self-pay | Admitting: Family Medicine

## 2019-11-20 ENCOUNTER — Ambulatory Visit (INDEPENDENT_AMBULATORY_CARE_PROVIDER_SITE_OTHER): Payer: Medicare Other | Admitting: Family Medicine

## 2019-11-20 VITALS — BP 120/80 | HR 86 | Temp 97.2°F | Resp 16 | Ht 63.5 in | Wt 276.0 lb

## 2019-11-20 DIAGNOSIS — E1140 Type 2 diabetes mellitus with diabetic neuropathy, unspecified: Secondary | ICD-10-CM | POA: Diagnosis not present

## 2019-11-20 DIAGNOSIS — I110 Hypertensive heart disease with heart failure: Secondary | ICD-10-CM | POA: Diagnosis not present

## 2019-11-20 DIAGNOSIS — N1832 Chronic kidney disease, stage 3b: Secondary | ICD-10-CM | POA: Diagnosis not present

## 2019-11-20 DIAGNOSIS — Z794 Long term (current) use of insulin: Secondary | ICD-10-CM

## 2019-11-20 DIAGNOSIS — N179 Acute kidney failure, unspecified: Secondary | ICD-10-CM | POA: Diagnosis not present

## 2019-11-20 LAB — BASIC METABOLIC PANEL
BUN: 51 mg/dL — ABNORMAL HIGH (ref 6–23)
CO2: 27 mEq/L (ref 19–32)
Calcium: 8.4 mg/dL (ref 8.4–10.5)
Chloride: 104 mEq/L (ref 96–112)
Creatinine, Ser: 2.01 mg/dL — ABNORMAL HIGH (ref 0.40–1.20)
GFR: 28.84 mL/min — ABNORMAL LOW (ref >60.00)
Glucose, Bld: 178 mg/dL — ABNORMAL HIGH (ref 70–99)
Potassium: 3.5 mEq/L (ref 3.5–5.1)
Sodium: 139 mEq/L (ref 135–145)

## 2019-11-20 MED ORDER — FUROSEMIDE 40 MG PO TABS: 40.0000 mg | ORAL_TABLET | Freq: Every day | ORAL | 1 refills | Status: DC

## 2019-11-20 NOTE — Telephone Encounter (Signed)
Rx was sent. ?Thanks, ?BJ ?

## 2019-11-20 NOTE — Assessment & Plan Note (Signed)
BP adequately controlled. No changes in current management. Continue low-salt diet and monitoring BP regularly.

## 2019-11-20 NOTE — Assessment & Plan Note (Signed)
We discussed benefits of wt loss as well as adverse effects of obesity. Consistency with healthy diet and physical activity as tolerated recommended. 10 to 15 minutes of daily walking recommended. She will start a food diary.

## 2019-11-20 NOTE — Assessment & Plan Note (Addendum)
HgA1C otherwise adequate for her age and chronic health problems, I would like closer to 7.0. Prednisone caused elevated BS but gradually getting closer to normal. No changes in current management. Regular exercise as tolerated and healthful diet with avoidance of added sugar food intake is an important part of treatment and recommended. Annual eye exam and foot care recommended. F/U in 5-6 months

## 2019-11-20 NOTE — Patient Instructions (Signed)
A few things to remember from today's visit:   Start a food diary. 10-15 min daily walk around the house. No changes today.  If you need refills please call your pharmacy. Do not use My Chart to request refills or for acute issues that need immediate attention.    Please be sure medication list is accurate. If a new problem present, please set up appointment sooner than planned today.

## 2019-11-20 NOTE — Telephone Encounter (Signed)
Pt is requesting a refill on Furosemide 40 mg. Pt uses [facility] representative on [employer]. Pt forgot to tell Dr. [name], she needed a refill this morning when she was in the office. Thanks

## 2019-11-20 NOTE — Telephone Encounter (Signed)
Okay for refill? Please advise 

## 2019-11-20 NOTE — Assessment & Plan Note (Signed)
Further recommendation will be given according to lab results. Continue low salt diet. Avoid NSAIDs. Adequate hydration.

## 2019-11-20 NOTE — Assessment & Plan Note (Signed)
Baseline Cr 1.7-1.9,eGFR 28-31. Referral to nephrologist placed. 

## 2019-11-20 NOTE — Progress Notes (Signed)
HPI: [name] is a 79 y.o. female, who is here today with her daughter for follow up.  She was last seen on 11/14/2019 for hospital follow-up. She still has cough "every now and then", no associated wheezing or dyspnea. O2 sats at home around 94 to 95% at room air. She is still using supplemental O2 at night with her CPAP. She takes Symbicort 80-4.5 mcg 2 puffs twice daily as needed.  In general she states that she is "feeling great."  Her daughter brought a form that needs to be completed.  Hypertension:  Currently she is on amlodipine 5 mg daily, Imdur 30 mg daily, and metoprolol succinate 50 mg daily. Home BP readings: 136/58, 150/73, 124/76, 111/75, 121/80, 121/90, and 138/63. HFrEF: Negative for orthopnea or PND.  She is on Furosemide 40 mg daily. Denies severe/frequent headache, visual changes, chest pain, palpitation, focal weakness, or worsening edema.  AKI on CKD III: She has not noted gross hematuria, foamy urine, or decreased urine output. She did not start sodium bicarbonate recommended at the time of the discharge. Mg 2.2 (11/06/19). Anion gap has been in normal range.  She was following with nephrologist,last visit around a year ago. Sh was seeing Dr Brittany Roberts, already retired.  Lab Results  Component Value Date   CREATININE 1.77 (H) 11/08/2019   BUN 67 (H) 11/08/2019   NA 136 11/08/2019   K 4.9 11/08/2019   CL 105 11/08/2019   CO2 21 (L) 11/08/2019   Lab Results  Component Value Date   WBC 14.4 (H) 11/08/2019   HGB 12.6 11/08/2019   HCT 37.4 11/08/2019   MCV 72.6 (L) 11/08/2019   PLT 206 11/08/2019   Due to chronic pain she cannot exercise regularly. She has not been consistent with following a healthful diet either.  DM II: BS's have been elevated, slowly getting better. Since 11/14/2019: 514, 250, 239, 31, 131, and 108. Recently she completed Prednisone treatment.  She has not had hypoglycemic events. Currently she is on Humulin in 30  units a.m. and 10 units p.m. Negative for polydipsia, polyuria, polyphagia.  Lab Results  Component Value Date   HGBA1C 7.6 (H) 10/17/2019   Review of Systems  Constitutional: Positive for fatigue. Negative for activity change and appetite change.  HENT: Negative for hearing loss, mouth sores and sore throat.   Gastrointestinal: Negative for abdominal pain, nausea and vomiting.  Musculoskeletal: Positive for arthralgias, back pain, gait problem and myalgias.  Skin: Negative for wound.  Allergic/Immunologic: Positive for environmental allergies.  Neurological: Negative for tremors, syncope and facial asymmetry.  Psychiatric/Behavioral: Negative for confusion and hallucinations. The patient is nervous/anxious.   Rest of ROS, see pertinent positives sand negatives in HPI  Current Outpatient Medications on File Prior to Visit  Medication Sig Dispense Refill  . Accu-Chek Softclix Lancets lancets Use as instructed 100 each 3  . acetaminophen (TYLENOL) 500 MG tablet Take 500 mg by mouth every 6 (six) hours as needed for moderate pain.    Marland Kitchen allopurinol (ZYLOPRIM) 100 MG tablet Take 1 tablet (100 mg total) by mouth daily. 90 tablet 2  . amLODipine (NORVASC) 5 MG tablet Take 1 tablet (5 mg total) by mouth daily. 90 tablet 3  . Ascorbic Acid (VITAMIN C PO) Take 1 tablet by mouth daily.    Marland Kitchen aspirin EC 81 MG tablet Take 81 mg by mouth daily.     Marland Kitchen atorvastatin (LIPITOR) 20 MG tablet Take 1 tablet (20 mg total) by  mouth daily. 90 tablet 2  . B Complex Vitamins (VITAMIN B COMPLEX) TABS Take 1 tablet by mouth daily.    . budesonide-formoterol (SYMBICORT) 80-4.5 MCG/ACT inhaler Inhale 2 puffs into the lungs 2 (two) times daily. (Patient taking differently: Inhale 2 puffs into the lungs 2 (two) times daily as needed (sob/wheezing). ) 1 Inhaler 3  . Cholecalciferol (VITAMIN D3) 5000 UNITS CAPS Take 5,000 Units by mouth daily.    . DULoxetine (CYMBALTA) 60 MG capsule Take 1 capsule (60 mg total) by mouth  daily. 90 capsule 2  . ferrous sulfate 325 (65 FE) MG tablet Take 325 mg by mouth daily with breakfast.    . Fluocinolone Acetonide Scalp 0.01 % OIL APPLY TO SCALP AT BEDTIME AS NEEDED    . gabapentin (NEURONTIN) 100 MG capsule Take 1 capsule (100 mg total) by mouth 2 (two) times daily.    Marland Kitchen glucose blood (ACCU-CHEK AVIVA PLUS) test strip Use to test blood sugar three times daily 100 each 3  . guaiFENesin-dextromethorphan (ROBITUSSIN DM) 100-10 MG/5ML syrup Take 5 mLs by mouth every 4 (four) hours as needed for cough. 118 mL 0  . Insulin NPH, Human,, Isophane, (HUMULIN N KWIKPEN) 100 UNIT/ML Kiwkpen 30 U am and 10 U pm 30 mL 3  . Insulin Pen Needle (B-D ULTRAFINE III SHORT PEN) 31G X 8 MM MISC CHECK BLOOD SUGAR 3 TIMES A DAY 100 each 3  . isosorbide mononitrate (IMDUR) 30 MG 24 hr tablet Take 1 tablet (30 mg total) by mouth daily. 90 tablet 3  . levETIRAcetam (KEPPRA) 500 MG tablet Take 1 tablet (500 mg total) by mouth 2 (two) times daily. 180 tablet 3  . linaclotide (LINZESS) 145 MCG CAPS capsule Take 1 capsule (145 mcg total) by mouth daily before breakfast. (Patient taking differently: Take 145 mcg by mouth daily as needed (abdominal cramping). ) 90 capsule 1  . memantine (NAMENDA) 10 MG tablet Take 1 tablet (10 mg total) by mouth 2 (two) times daily. 60 tablet 11  . metoprolol succinate (TOPROL-XL) 50 MG 24 hr tablet Take 1 tablet (50 mg total) by mouth daily. Take with or immediately following a meal. 90 tablet 1  . montelukast (SINGULAIR) 10 MG tablet Take 1 tablet (10 mg total) by mouth at bedtime. 90 tablet 3  . Omega-3 Fatty Acids (FISH OIL) 1000 MG CAPS Take 1 capsule by mouth daily.    . pantoprazole (PROTONIX) 40 MG tablet TAKE 1 TABLET(40 MG) BY MOUTH DAILY (Patient taking differently: Take 40 mg by mouth 2 (two) times daily. ) 90 tablet 3  . PROAIR HFA 108 (90 Base) MCG/ACT inhaler INHALE 2 PUFF BY MOUTH EVERY 4 HOURS AS NEEDED USE ONLY IF YOU ARE WHEEZING 8.5 g 3  . sodium bicarbonate  650 MG tablet Take 1 tablet (650 mg total) by mouth 2 (two) times daily.    . sodium zirconium cyclosilicate (LOKELMA) 5 g packet Take 5 g by mouth daily.    Marland Kitchen Spacer/Aero-Holding Chambers (AEROCHAMBER PLUS) inhaler Use as instructed to use with inahaler. 1 each 1   No current facility-administered medications on file prior to visit.   Past Medical History:  Diagnosis Date  . Acute delirium 01/14/2017  . Anxiety   . Arthritis    "all over"  . Asthma   . Bipolar disorder ([place])   . Complication of anesthesia    "w/right foot OR they gave me too much and they couldn't get me woke"  . Congestive heart failure, unspecified   .  Coronary artery calcification seen on CAT scan 01/14/2017  . Coronary artery disease    "I've got 1 stent" (06/30/2015)  . Depression   . Discoid lupus   . Fibromyalgia    "I've been told I have this; can't take Lyrica cause I'm allergic to it" (06/30/2015)  . GERD (gastroesophageal reflux disease)   . History of gout   . History of hiatal hernia    "real bad" (06/30/2015)  . Hypertension   . Hypothyroidism   . Memory loss   . On home oxygen therapy    "2L q hs" (06/30/2015)  . OSA (obstructive sleep apnea)    "just use my oxygen; no mask" (06/30/2015)  . Other and unspecified hyperlipidemia   . Penetrating foot wound    left nonhealing foot wound on the dorsal surface  . Peripheral neuropathy   . Pneumonia "many of times"  . Refusal of blood transfusions as patient is Jehovah's Witness   . Renal failure, unspecified    "kidneys not working 100%" (06/30/2015)  . Seizure ([place])   . Sickle cell trait ([place])   . Systemic lupus ([place])   . Thoracic aortic atherosclerosis ([place]) 01/14/2017  . Type II diabetes mellitus ([place])   . Unspecified vitamin D deficiency    Allergies  Allergen Reactions  . Ace Inhibitors Other (See Comments)    Coincided with sig bump in creat. Retried and creat bumped again. Coincided with sig bump in creat. Retried and creat bumped  again. Coincided with sig bump in creat. Retried and creat bumped again. Other reaction(s): Other (See Comments) Coincided with sig bump in creat. Retried and creat bumped again. Coincided with sig bump in creat. Retried and creat bumped again.  . Isosorb Dinitrate-Hydralazine Other (See Comments)    Sleep all the time. Sleep all the time. Other reaction(s): Other (See Comments) Sleep all the time.  Marland Kitchen Penicillins Anaphylaxis, Swelling and Rash    Has patient had a PCN reaction causing immediate rash, facial/tongue/throat swelling, SOB or lightheadedness with hypotension: Yes Has patient had a PCN reaction causing severe rash involving mucus membranes or skin necrosis: Yes Has patient had a PCN reaction that required hospitalization: Yes Has patient had a PCN reaction occurring within the last 10 years: No If all of the above answers are "NO", then may proceed with Cephalosporin use.   Marland Kitchen Buspirone Other (See Comments)    pain pain pain Other reaction(s): Other (See Comments) pain pain  . Pregabalin Swelling  . Ropinirole Hydrochloride Swelling  . [place] Rash    "Swelling of the tongue"    Social History   Socioeconomic History  . Marital status: Single    Spouse name: Not on file  . Number of children: 4  . Years of education: 28 th  . Highest education level: Not on file  Occupational History  . Occupation: Retired- Engineer, production    Employer: RETIRED  Tobacco Use  . Smoking status: Former Smoker    Packs/day: 3.00    Years: 40.00    Pack years: 120.00    Types: Cigarettes    Start date: 07/05/1960    Quit date: 07/05/1998    Years since quitting: 21.3  . Smokeless tobacco: Never Used  Substance and Sexual Activity  . Alcohol use: No    Alcohol/week: 0.0 standard drinks    Comment: havent drank in over 40 years  . Drug use: No  . Sexual activity: Never  Other Topics Concern  . Not on  file  Social History Narrative   Health Care POA:     Emergency Contact: daughter, Brittany Roberts [phone] or Brittany Roberts [phone]   End of Life Plan: Does not want to be ventilated or feeding tubes.    Who lives with you: Lives alone   Any pets: none   Diet: Patient reports enjoying and eating junk food.  Does not regulate types of food and currently her dentures are broken so it is hard to eat some foods.   Exercise: Patient does not have an exercise plan.   Seatbelts: Patient reports wearing seatbelt when in vehicle.   Sun Exposure/Protection: Patient reports not going outside very often.   Hobbies: Patient enjoys reading the bible and watching game shows.    Right-handed.   1 cup caffeine per day.   Former smoker-stopped 2000   Alcohol none   Social Determinants of Radio broadcast assistant Strain:   . Difficulty of Paying Living Expenses:   Food Insecurity:   . Worried About Charity fundraiser in the Last Year:   . Arboriculturist in the Last Year:   Transportation Needs:   . Film/video editor (Medical):   Marland Kitchen Lack of Transportation (Non-Medical):   Physical Activity:   . Days of Exercise per Week:   . Minutes of Exercise per Session:   Stress:   . Feeling of Stress :   Social Connections:   . Frequency of Communication with Friends and Family:   . Frequency of Social Gatherings with Friends and Family:   . Attends Religious Services:   . Active Member of Clubs or Organizations:   . Attends Archivist Meetings:   Marland Kitchen Marital Status:     Vitals:   11/20/19 1205  BP: 120/80  Pulse: 86  Resp: 16  Temp: (!) 97.2 F (36.2 C)  SpO2: 92%   Wt Readings from Last 3 Encounters:  11/20/19 276 lb (125.2 kg)  11/08/19 279 lb 1.6 oz (126.6 kg)  10/17/19 288 lb (130.6 kg)   Body mass index is 48.12 kg/m.  Physical Exam  Nursing note and vitals reviewed. Constitutional: She is oriented to person, place, and time. She appears well-developed. No distress.  HENT:  Head: Normocephalic and atraumatic.  Right Ear:  Decreased hearing is noted.  Left Ear: Decreased hearing is noted.  Mouth/Throat: Oropharynx is clear and moist and mucous membranes are normal.  Edentulous. Lost one haring aid.  Eyes: Pupils are equal, round, and reactive to light. Conjunctivae are normal.  Cardiovascular: Normal rate and regular rhythm.  No murmur heard. Pulses:      Posterior tibial pulses are 2+ on the right side and 2+ on the left side.  Respiratory: Effort normal and breath sounds normal. No respiratory distress.  GI: Soft. She exhibits no mass. There is no abdominal tenderness.  Musculoskeletal:        General: No edema.  Lymphadenopathy:    She has no cervical adenopathy.  Neurological: She is alert and oriented to person, place, and time. She has normal strength. No cranial nerve deficit.  Gait assisted by a walker.  Skin: Skin is warm. No rash noted. No erythema.  Psychiatric: She has a normal mood and affect.  Well groomed, good eye contact.   ASSESSMENT AND PLAN:  [name] was seen today for follow-up.  Orders Placed This Encounter  Procedures  . Basic metabolic panel  . Ambulatory referral to Nephrology   Lab Results  Component  Value Date   CREATININE 2.01 (H) 11/20/2019   BUN 51 (H) 11/20/2019   NA 139 11/20/2019   K 3.5 11/20/2019   CL 104 11/20/2019   CO2 27 11/20/2019   Hypertensive heart disease with heart failure (HCC) BP adequately controlled. No changes in current management. Continue low-salt diet and monitoring BP regularly.  Type 2 diabetes mellitus with diabetic neuropathy, unspecified ([place]) HgA1C otherwise adequate for her age and chronic health problems, I would like closer to 7.0. Prednisone caused elevated BS but gradually getting closer to normal. No changes in current management. Regular exercise as tolerated and healthful diet with avoidance of added sugar food intake is an important part of treatment and recommended. Annual eye exam and foot care  recommended. F/U in 5-6 months   AKI (acute kidney injury) [facility]) Further recommendation will be given according to lab results. Continue low salt diet. Avoid NSAIDs. Adequate hydration.  Morbid obesity ([place]) We discussed benefits of wt loss as well as adverse effects of obesity. Consistency with healthy diet and physical activity as tolerated recommended. 10 to 15 minutes of daily walking recommended. She will start a food diary.  Chronic kidney disease, stage III (moderate) (HCC) Baseline Cr 1.7-1.9,eGFR 28-31. Referral to nephrologist placed.  Return in about 3 months (around 02/20/2020).   Casin Federici G. [name], MD  [facility] [place] office.

## 2019-11-28 ENCOUNTER — Telehealth: Payer: Self-pay | Admitting: *Deleted

## 2019-11-28 NOTE — Telephone Encounter (Signed)
Spoke with patient and informed her that her forms are completed and need her signature and would be placed in cabinet at front desk for her daughter to pick up. Patient verbalized understanding.

## 2019-11-30 NOTE — Telephone Encounter (Signed)
Daughter came and picked up forms and I sent the copies to scan.

## 2019-12-05 ENCOUNTER — Inpatient Hospital Stay (HOSPITAL_COMMUNITY)
Admission: EM | Admit: 2019-12-05 | Discharge: 2019-12-07 | DRG: 291 | Disposition: A | Discharge status: Home Health | Payer: Medicare Other | Attending: Internal Medicine | Admitting: Internal Medicine

## 2019-12-05 ENCOUNTER — Encounter (HOSPITAL_COMMUNITY): Payer: Self-pay | Admitting: Emergency Medicine

## 2019-12-05 ENCOUNTER — Other Ambulatory Visit: Payer: Self-pay

## 2019-12-05 ENCOUNTER — Emergency Department (HOSPITAL_COMMUNITY): Payer: Medicare Other

## 2019-12-05 ENCOUNTER — Telehealth: Payer: Self-pay | Admitting: Family Medicine

## 2019-12-05 DIAGNOSIS — Z832 Family history of diseases of the blood and blood-forming organs and certain disorders involving the immune mechanism: Secondary | ICD-10-CM

## 2019-12-05 DIAGNOSIS — N184 Chronic kidney disease, stage 4 (severe): Secondary | ICD-10-CM | POA: Diagnosis present

## 2019-12-05 DIAGNOSIS — K219 Gastro-esophageal reflux disease without esophagitis: Secondary | ICD-10-CM | POA: Diagnosis present

## 2019-12-05 DIAGNOSIS — I130 Hypertensive heart and chronic kidney disease with heart failure and stage 1 through stage 4 chronic kidney disease, or unspecified chronic kidney disease: Principal | ICD-10-CM | POA: Diagnosis present

## 2019-12-05 DIAGNOSIS — Z79899 Other long term (current) drug therapy: Secondary | ICD-10-CM

## 2019-12-05 DIAGNOSIS — Z743 Need for continuous supervision: Secondary | ICD-10-CM | POA: Diagnosis not present

## 2019-12-05 DIAGNOSIS — I5043 Acute on chronic combined systolic (congestive) and diastolic (congestive) heart failure: Secondary | ICD-10-CM | POA: Diagnosis present

## 2019-12-05 DIAGNOSIS — I513 Intracardiac thrombosis, not elsewhere classified: Secondary | ICD-10-CM | POA: Diagnosis not present

## 2019-12-05 DIAGNOSIS — K5900 Constipation, unspecified: Secondary | ICD-10-CM

## 2019-12-05 DIAGNOSIS — Z87891 Personal history of nicotine dependence: Secondary | ICD-10-CM

## 2019-12-05 DIAGNOSIS — I2510 Atherosclerotic heart disease of native coronary artery without angina pectoris: Secondary | ICD-10-CM | POA: Diagnosis not present

## 2019-12-05 DIAGNOSIS — Z825 Family history of asthma and other chronic lower respiratory diseases: Secondary | ICD-10-CM

## 2019-12-05 DIAGNOSIS — Z7951 Long term (current) use of inhaled steroids: Secondary | ICD-10-CM

## 2019-12-05 DIAGNOSIS — E1165 Type 2 diabetes mellitus with hyperglycemia: Secondary | ICD-10-CM | POA: Diagnosis present

## 2019-12-05 DIAGNOSIS — R0602 Shortness of breath: Secondary | ICD-10-CM | POA: Diagnosis not present

## 2019-12-05 DIAGNOSIS — R5381 Other malaise: Secondary | ICD-10-CM | POA: Diagnosis not present

## 2019-12-05 DIAGNOSIS — Z7982 Long term (current) use of aspirin: Secondary | ICD-10-CM

## 2019-12-05 DIAGNOSIS — I5033 Acute on chronic diastolic (congestive) heart failure: Secondary | ICD-10-CM | POA: Diagnosis present

## 2019-12-05 DIAGNOSIS — Z6841 Body Mass Index (BMI) 40.0 and over, adult: Secondary | ICD-10-CM

## 2019-12-05 DIAGNOSIS — M109 Gout, unspecified: Secondary | ICD-10-CM | POA: Diagnosis present

## 2019-12-05 DIAGNOSIS — Z888 Allergy status to other drugs, medicaments and biological substances status: Secondary | ICD-10-CM

## 2019-12-05 DIAGNOSIS — N281 Cyst of kidney, acquired: Secondary | ICD-10-CM | POA: Diagnosis not present

## 2019-12-05 DIAGNOSIS — Z833 Family history of diabetes mellitus: Secondary | ICD-10-CM

## 2019-12-05 DIAGNOSIS — I472 Ventricular tachycardia: Secondary | ICD-10-CM | POA: Diagnosis present

## 2019-12-05 DIAGNOSIS — J9621 Acute and chronic respiratory failure with hypoxia: Secondary | ICD-10-CM | POA: Diagnosis not present

## 2019-12-05 DIAGNOSIS — Z789 Other specified health status: Secondary | ICD-10-CM | POA: Diagnosis not present

## 2019-12-05 DIAGNOSIS — F329 Major depressive disorder, single episode, unspecified: Secondary | ICD-10-CM | POA: Diagnosis not present

## 2019-12-05 DIAGNOSIS — Z8249 Family history of ischemic heart disease and other diseases of the circulatory system: Secondary | ICD-10-CM | POA: Diagnosis not present

## 2019-12-05 DIAGNOSIS — F419 Anxiety disorder, unspecified: Secondary | ICD-10-CM | POA: Diagnosis not present

## 2019-12-05 DIAGNOSIS — I252 Old myocardial infarction: Secondary | ICD-10-CM

## 2019-12-05 DIAGNOSIS — E1149 Type 2 diabetes mellitus with other diabetic neurological complication: Secondary | ICD-10-CM

## 2019-12-05 DIAGNOSIS — Z66 Do not resuscitate: Secondary | ICD-10-CM | POA: Diagnosis present

## 2019-12-05 DIAGNOSIS — Z880 Allergy status to penicillin: Secondary | ICD-10-CM

## 2019-12-05 DIAGNOSIS — F0390 Unspecified dementia without behavioral disturbance: Secondary | ICD-10-CM | POA: Diagnosis not present

## 2019-12-05 DIAGNOSIS — Z8616 Personal history of COVID-19: Secondary | ICD-10-CM | POA: Diagnosis not present

## 2019-12-05 DIAGNOSIS — Z8261 Family history of arthritis: Secondary | ICD-10-CM | POA: Diagnosis not present

## 2019-12-05 DIAGNOSIS — E039 Hypothyroidism, unspecified: Secondary | ICD-10-CM | POA: Diagnosis not present

## 2019-12-05 DIAGNOSIS — E1122 Type 2 diabetes mellitus with diabetic chronic kidney disease: Secondary | ICD-10-CM | POA: Diagnosis present

## 2019-12-05 DIAGNOSIS — I10 Essential (primary) hypertension: Secondary | ICD-10-CM | POA: Diagnosis not present

## 2019-12-05 DIAGNOSIS — Z8701 Personal history of pneumonia (recurrent): Secondary | ICD-10-CM

## 2019-12-05 DIAGNOSIS — M797 Fibromyalgia: Secondary | ICD-10-CM | POA: Diagnosis present

## 2019-12-05 DIAGNOSIS — E559 Vitamin D deficiency, unspecified: Secondary | ICD-10-CM | POA: Diagnosis present

## 2019-12-05 DIAGNOSIS — J45909 Unspecified asthma, uncomplicated: Secondary | ICD-10-CM | POA: Diagnosis present

## 2019-12-05 DIAGNOSIS — I5021 Acute systolic (congestive) heart failure: Secondary | ICD-10-CM | POA: Diagnosis not present

## 2019-12-05 DIAGNOSIS — I428 Other cardiomyopathies: Secondary | ICD-10-CM | POA: Diagnosis not present

## 2019-12-05 DIAGNOSIS — E1142 Type 2 diabetes mellitus with diabetic polyneuropathy: Secondary | ICD-10-CM | POA: Diagnosis present

## 2019-12-05 DIAGNOSIS — Z955 Presence of coronary angioplasty implant and graft: Secondary | ICD-10-CM

## 2019-12-05 DIAGNOSIS — D509 Iron deficiency anemia, unspecified: Secondary | ICD-10-CM | POA: Diagnosis not present

## 2019-12-05 DIAGNOSIS — G40909 Epilepsy, unspecified, not intractable, without status epilepticus: Secondary | ICD-10-CM | POA: Diagnosis present

## 2019-12-05 DIAGNOSIS — I509 Heart failure, unspecified: Secondary | ICD-10-CM

## 2019-12-05 DIAGNOSIS — Z841 Family history of disorders of kidney and ureter: Secondary | ICD-10-CM

## 2019-12-05 DIAGNOSIS — M329 Systemic lupus erythematosus, unspecified: Secondary | ICD-10-CM | POA: Diagnosis present

## 2019-12-05 DIAGNOSIS — Z794 Long term (current) use of insulin: Secondary | ICD-10-CM

## 2019-12-05 DIAGNOSIS — R0902 Hypoxemia: Secondary | ICD-10-CM | POA: Diagnosis not present

## 2019-12-05 DIAGNOSIS — F209 Schizophrenia, unspecified: Secondary | ICD-10-CM | POA: Diagnosis present

## 2019-12-05 DIAGNOSIS — D573 Sickle-cell trait: Secondary | ICD-10-CM | POA: Diagnosis present

## 2019-12-05 LAB — COMPREHENSIVE METABOLIC PANEL
ALT: 18 U/L (ref 0–44)
AST: 27 U/L (ref 15–41)
Albumin: 2.5 g/dL — ABNORMAL LOW (ref 3.5–5.0)
Alkaline Phosphatase: 100 U/L (ref 38–126)
Anion gap: 10 (ref 5–15)
BUN: 23 mg/dL (ref 8–23)
CO2: 24 mmol/L (ref 22–32)
Calcium: 8.7 mg/dL — ABNORMAL LOW (ref 8.9–10.3)
Chloride: 107 mmol/L (ref 98–111)
Creatinine, Ser: 1.58 mg/dL — ABNORMAL HIGH (ref 0.44–1.00)
GFR calc Af Amer: 35 mL/min — ABNORMAL LOW (ref >60)
GFR calc non Af Amer: 31 mL/min — ABNORMAL LOW (ref >60)
Glucose, Bld: 151 mg/dL — ABNORMAL HIGH (ref 70–99)
Potassium: 3.8 mmol/L (ref 3.5–5.1)
Sodium: 141 mmol/L (ref 135–145)
Total Bilirubin: 0.7 mg/dL (ref 0.3–1.2)
Total Protein: 6.5 g/dL (ref 6.5–8.1)

## 2019-12-05 LAB — CBC WITH DIFFERENTIAL/PLATELET
Abs Immature Granulocytes: 0.13 10*3/uL — ABNORMAL HIGH (ref 0.00–0.07)
Basophils Absolute: 0.1 10*3/uL (ref 0.0–0.1)
Basophils Relative: 1 %
Eosinophils Absolute: 0.1 10*3/uL (ref 0.0–0.5)
Eosinophils Relative: 1 %
HCT: 31 % — ABNORMAL LOW (ref 36.0–46.0)
Hemoglobin: 10.3 g/dL — ABNORMAL LOW (ref 12.0–15.0)
Immature Granulocytes: 1 %
Lymphocytes Relative: 20 %
Lymphs Abs: 2.2 10*3/uL (ref 0.7–4.0)
MCH: 25.1 pg — ABNORMAL LOW (ref 26.0–34.0)
MCHC: 33.2 g/dL (ref 30.0–36.0)
MCV: 75.4 fL — ABNORMAL LOW (ref 80.0–100.0)
Monocytes Absolute: 0.8 10*3/uL (ref 0.1–1.0)
Monocytes Relative: 7 %
Neutro Abs: 7.6 10*3/uL (ref 1.7–7.7)
Neutrophils Relative %: 70 %
Platelets: 228 10*3/uL (ref 150–400)
RBC: 4.11 MIL/uL (ref 3.87–5.11)
RDW: 14.7 % (ref 11.5–15.5)
WBC: 10.8 10*3/uL — ABNORMAL HIGH (ref 4.0–10.5)
nRBC: 0.3 % — ABNORMAL HIGH (ref 0.0–0.2)

## 2019-12-05 LAB — URINALYSIS, ROUTINE W REFLEX MICROSCOPIC
Bilirubin Urine: NEGATIVE
Glucose, UA: NEGATIVE mg/dL
Hgb urine dipstick: NEGATIVE
Ketones, ur: NEGATIVE mg/dL
Leukocytes,Ua: NEGATIVE
Nitrite: NEGATIVE
Protein, ur: NEGATIVE mg/dL
Specific Gravity, Urine: 1.008 (ref 1.005–1.030)
pH: 5 (ref 5.0–8.0)

## 2019-12-05 LAB — TROPONIN I (HIGH SENSITIVITY)
Troponin I (High Sensitivity): 20 ng/L — ABNORMAL HIGH (ref <18)
Troponin I (High Sensitivity): 22 ng/L — ABNORMAL HIGH (ref <18)

## 2019-12-05 LAB — GLUCOSE, CAPILLARY
Glucose-Capillary: 101 mg/dL — ABNORMAL HIGH (ref 70–99)
Glucose-Capillary: 144 mg/dL — ABNORMAL HIGH (ref 70–99)

## 2019-12-05 LAB — BRAIN NATRIURETIC PEPTIDE: B Natriuretic Peptide: 505.5 pg/mL — ABNORMAL HIGH (ref 0.0–100.0)

## 2019-12-05 LAB — LIPASE, BLOOD: Lipase: 30 U/L (ref 11–51)

## 2019-12-05 MED ORDER — FLUOCINOLONE ACETONIDE SCALP 0.01 % EX OIL: 1.0000 "application " | TOPICAL_OIL | Freq: Every evening | CUTANEOUS | Status: DC | PRN

## 2019-12-05 MED ORDER — SODIUM CHLORIDE 0.9 % IV SOLN: 250.0000 mL | INTRAVENOUS | Status: DC | PRN

## 2019-12-05 MED ORDER — LINACLOTIDE 145 MCG PO CAPS
145.0000 ug | ORAL_CAPSULE | Freq: Every day | ORAL | Status: DC | PRN
  Filled 2019-12-05: qty 1

## 2019-12-05 MED ORDER — ALLOPURINOL 100 MG PO TABS
100.0000 mg | ORAL_TABLET | Freq: Every day | ORAL | Status: DC
  Administered 2019-12-06 – 2019-12-07 (×2): 100 mg via ORAL
  Filled 2019-12-05 (×2): qty 1

## 2019-12-05 MED ORDER — AMLODIPINE BESYLATE 5 MG PO TABS
5.0000 mg | ORAL_TABLET | Freq: Every day | ORAL | Status: DC
  Administered 2019-12-06 – 2019-12-07 (×2): 5 mg via ORAL
  Filled 2019-12-05 (×2): qty 1

## 2019-12-05 MED ORDER — LEVETIRACETAM 500 MG PO TABS
500.0000 mg | ORAL_TABLET | Freq: Two times a day (BID) | ORAL | Status: DC
  Administered 2019-12-05 – 2019-12-07 (×4): 500 mg via ORAL
  Filled 2019-12-05 (×4): qty 1

## 2019-12-05 MED ORDER — ATORVASTATIN CALCIUM 10 MG PO TABS
20.0000 mg | ORAL_TABLET | Freq: Every day | ORAL | Status: DC
  Administered 2019-12-06 – 2019-12-07 (×2): 20 mg via ORAL
  Filled 2019-12-05 (×2): qty 2

## 2019-12-05 MED ORDER — HEPARIN SODIUM (PORCINE) 5000 UNIT/ML IJ SOLN
5000.0000 [IU] | Freq: Two times a day (BID) | INTRAMUSCULAR | Status: DC
  Administered 2019-12-05 – 2019-12-06 (×2): 5000 [IU] via SUBCUTANEOUS
  Filled 2019-12-05 (×2): qty 1

## 2019-12-05 MED ORDER — INSULIN ASPART 100 UNIT/ML ~~LOC~~ SOLN
0.0000 [IU] | Freq: Three times a day (TID) | SUBCUTANEOUS | Status: DC
  Administered 2019-12-06 – 2019-12-07 (×4): 2 [IU] via SUBCUTANEOUS
  Administered 2019-12-07: 3 [IU] via SUBCUTANEOUS

## 2019-12-05 MED ORDER — PANTOPRAZOLE SODIUM 40 MG PO TBEC
40.0000 mg | DELAYED_RELEASE_TABLET | Freq: Every day | ORAL | Status: DC
  Administered 2019-12-06 – 2019-12-07 (×2): 40 mg via ORAL
  Filled 2019-12-05 (×2): qty 1

## 2019-12-05 MED ORDER — ALBUTEROL SULFATE (2.5 MG/3ML) 0.083% IN NEBU: 2.5000 mg | INHALATION_SOLUTION | RESPIRATORY_TRACT | Status: DC | PRN

## 2019-12-05 MED ORDER — ASCORBIC ACID 500 MG PO TABS
250.0000 mg | ORAL_TABLET | Freq: Every day | ORAL | Status: DC
  Administered 2019-12-06 – 2019-12-07 (×2): 250 mg via ORAL
  Filled 2019-12-05 (×2): qty 1

## 2019-12-05 MED ORDER — SODIUM CHLORIDE 0.9% FLUSH
3.0000 mL | Freq: Two times a day (BID) | INTRAVENOUS | Status: DC
  Administered 2019-12-05 – 2019-12-06 (×4): 3 mL via INTRAVENOUS

## 2019-12-05 MED ORDER — GUAIFENESIN-DM 100-10 MG/5ML PO SYRP: 5.0000 mL | ORAL_SOLUTION | ORAL | Status: DC | PRN

## 2019-12-05 MED ORDER — ACETAMINOPHEN 500 MG PO TABS: 500.0000 mg | ORAL_TABLET | Freq: Four times a day (QID) | ORAL | Status: DC | PRN

## 2019-12-05 MED ORDER — POLYETHYL GLYCOL-PROPYL GLYCOL 0.4-0.3 % OP GEL: 1.0000 "application " | OPHTHALMIC | Status: DC | PRN

## 2019-12-05 MED ORDER — METOPROLOL SUCCINATE ER 50 MG PO TB24
50.0000 mg | ORAL_TABLET | Freq: Every day | ORAL | Status: DC
  Administered 2019-12-06 – 2019-12-07 (×2): 50 mg via ORAL
  Filled 2019-12-05 (×2): qty 1

## 2019-12-05 MED ORDER — VITAMIN D3 25 MCG (1000 UNIT) PO TABS
5000.0000 [IU] | ORAL_TABLET | Freq: Every day | ORAL | Status: DC
  Administered 2019-12-06 – 2019-12-07 (×2): 5000 [IU] via ORAL
  Filled 2019-12-05 (×4): qty 5

## 2019-12-05 MED ORDER — FERROUS SULFATE 325 (65 FE) MG PO TABS
325.0000 mg | ORAL_TABLET | Freq: Every day | ORAL | Status: DC
  Administered 2019-12-06 – 2019-12-07 (×2): 325 mg via ORAL
  Filled 2019-12-05 (×2): qty 1

## 2019-12-05 MED ORDER — FLUTICASONE FUROATE-VILANTEROL 100-25 MCG/INH IN AEPB
1.0000 | INHALATION_SPRAY | Freq: Every day | RESPIRATORY_TRACT | Status: DC
  Administered 2019-12-06 – 2019-12-07 (×2): 1 via RESPIRATORY_TRACT
  Filled 2019-12-05: qty 28

## 2019-12-05 MED ORDER — OMEGA-3-ACID ETHYL ESTERS 1 G PO CAPS
1.0000 g | ORAL_CAPSULE | Freq: Every day | ORAL | Status: DC
  Administered 2019-12-06 – 2019-12-07 (×2): 1 g via ORAL
  Filled 2019-12-05 (×2): qty 1

## 2019-12-05 MED ORDER — ISOSORBIDE MONONITRATE ER 30 MG PO TB24
30.0000 mg | ORAL_TABLET | Freq: Every day | ORAL | Status: DC
  Administered 2019-12-06 – 2019-12-07 (×2): 30 mg via ORAL
  Filled 2019-12-05 (×2): qty 1

## 2019-12-05 MED ORDER — INSULIN ASPART 100 UNIT/ML ~~LOC~~ SOLN: 0.0000 [IU] | Freq: Every day | SUBCUTANEOUS | Status: DC

## 2019-12-05 MED ORDER — FUROSEMIDE 10 MG/ML IJ SOLN
40.0000 mg | Freq: Once | INTRAMUSCULAR | Status: AC
  Administered 2019-12-05: 40 mg via INTRAVENOUS
  Filled 2019-12-05: qty 4

## 2019-12-05 MED ORDER — VITAMIN B COMPLEX PO TABS
1.0000 | ORAL_TABLET | Freq: Every day | ORAL | Status: DC
  Filled 2019-12-05: qty 1

## 2019-12-05 MED ORDER — ASPIRIN EC 81 MG PO TBEC
81.0000 mg | DELAYED_RELEASE_TABLET | Freq: Every day | ORAL | Status: DC
  Administered 2019-12-06: 81 mg via ORAL
  Filled 2019-12-05: qty 1

## 2019-12-05 MED ORDER — DULOXETINE HCL 60 MG PO CPEP
60.0000 mg | ORAL_CAPSULE | Freq: Every day | ORAL | Status: DC
  Administered 2019-12-06 – 2019-12-07 (×2): 60 mg via ORAL
  Filled 2019-12-05 (×2): qty 1

## 2019-12-05 MED ORDER — FUROSEMIDE 40 MG PO TABS
40.0000 mg | ORAL_TABLET | Freq: Every day | ORAL | Status: DC
  Filled 2019-12-05: qty 1

## 2019-12-05 MED ORDER — ONDANSETRON HCL 4 MG/2ML IJ SOLN: 4.0000 mg | Freq: Four times a day (QID) | INTRAMUSCULAR | Status: DC | PRN

## 2019-12-05 MED ORDER — GABAPENTIN 100 MG PO CAPS
100.0000 mg | ORAL_CAPSULE | Freq: Two times a day (BID) | ORAL | Status: DC
  Administered 2019-12-05 – 2019-12-07 (×4): 100 mg via ORAL
  Filled 2019-12-05 (×4): qty 1

## 2019-12-05 MED ORDER — MONTELUKAST SODIUM 10 MG PO TABS
10.0000 mg | ORAL_TABLET | Freq: Every day | ORAL | Status: DC
  Administered 2019-12-05 – 2019-12-06 (×2): 10 mg via ORAL
  Filled 2019-12-05 (×3): qty 1

## 2019-12-05 MED ORDER — SODIUM CHLORIDE 0.9% FLUSH: 3.0000 mL | INTRAVENOUS | Status: DC | PRN

## 2019-12-05 MED ORDER — MEMANTINE HCL 10 MG PO TABS
10.0000 mg | ORAL_TABLET | Freq: Two times a day (BID) | ORAL | Status: DC
  Administered 2019-12-05 – 2019-12-07 (×4): 10 mg via ORAL
  Filled 2019-12-05 (×5): qty 1

## 2019-12-05 NOTE — H&P (Signed)
History and Physical    Brittany Roberts WFU:[id number] DOB: 1941/02/04 DOA: 12/05/2019  PCP: [name], [name], MD (Confirm with patient/family/NH records and if not entered, this has to be entered at [facility] point of entry) Patient coming from: [facility]  I have personally briefly reviewed patient's old medical records in [place]  Chief Complaint: SOB  HPI: Brittany Roberts is a 79 y.o. female with medical history significant of chronic combined systolic and diastolic congestive heart failure (LVEF 45 to 50% with akinesis in inferior wall, grade 2 diastolic dysfunction, in 2025), bipolar disorder, coronary artery disease, depression, fibromyalgia, dementia, GERD,  hypothyroidism, hypertension, diabetes mellitus, chronic kidney disease stage III, presented to the hospital with increasing shortness of breath.  Patient has been on Croitoru dose of 40 mg Lasix daily until about 3 weeks ago on May 12, she went to see her PCP who cut down her Lasix to 20 mg daily for worsening of her kidney functions.  And started about 4, 5 days ago patient started to have increasing swelling of the legs and increasing short of breath initially was exertional then became more constant, denied any chest pain, no cough, no fever chills.  Her daughter who is the main caregiver, started to resume her 40 mg daily Lasix for the last 3 days without significant improvement.  Patient also complains about " bloated belly" but denied any nauseous vomit, no diarrhea no dysuria. ED Course: X-ray showing lungs are congested, 1 dose of 40 mg IV Lasix given.  Patient remained hypoxic.  CT abdomen pelvis without contrast did not show any significant findings.  Lipase level within normal limits.  Review of Systems: As per HPI otherwise 10 point review of systems negative.    Past Medical History:  Diagnosis Date  . Acute delirium 01/14/2017  . Anxiety   . Arthritis    "all over"  . Asthma   . Bipolar disorder ([place])   . Complication of  anesthesia    "w/right foot OR they gave me too much and they couldn't get me woke"  . Congestive heart failure, unspecified   . Coronary artery calcification seen on CAT scan 01/14/2017  . Coronary artery disease    "I've got 1 stent" (06/30/2015)  . Depression   . Discoid lupus   . Fibromyalgia    "I've been told I have this; can't take Lyrica cause I'm allergic to it" (06/30/2015)  . GERD (gastroesophageal reflux disease)   . History of gout   . History of hiatal hernia    "real bad" (06/30/2015)  . Hypertension   . Hypothyroidism   . Memory loss   . On home oxygen therapy    "2L q hs" (06/30/2015)  . OSA (obstructive sleep apnea)    "just use my oxygen; no mask" (06/30/2015)  . Other and unspecified hyperlipidemia   . Penetrating foot wound    left nonhealing foot wound on the dorsal surface  . Peripheral neuropathy   . Pneumonia "many of times"  . Refusal of blood transfusions as patient is Jehovah's Witness   . Renal failure, unspecified    "kidneys not working 100%" (06/30/2015)  . Seizure ([place])   . Sickle cell trait ([place])   . Systemic lupus ([place])   . Thoracic aortic atherosclerosis ([place]) 01/14/2017  . Type II diabetes mellitus ([place])   . Unspecified vitamin D deficiency     Past Surgical History:  Procedure Laterality Date  . ANKLE ARTHROSCOPY WITH OPEN REDUCTION INTERNAL  FIXATION (ORIF) Right 03/2011  . CARDIAC CATHETERIZATION N/A 07/01/2015   Procedure: Left Heart Cath and Coronary Angiography;  Surgeon: Adrian Prows, MD;  Location: [facility];  Service: Cardiovascular;  Laterality: N/A;  . CARDIAC CATHETERIZATION N/A 07/01/2015   Procedure: Intravascular Pressure Wire/FFR Study;  Surgeon: Adrian Prows, MD;  Location: [facility];  Service: Cardiovascular;  Laterality: N/A;  . CARPAL TUNNEL RELEASE Bilateral   . CATARACT EXTRACTION W/ INTRAOCULAR LENS  IMPLANT, BILATERAL Bilateral   . CHOLECYSTECTOMY OPEN    . COLONOSCOPY N/A 12/10/2013   Procedure:  COLONOSCOPY;  Surgeon: Inda Castle, MD;  Location: [facility];  Service: Endoscopy;  Laterality: N/A;  . FRACTURE SURGERY    . INCISION AND DRAINAGE ABSCESS Left    foot "under my little toe"  . KNEE LIGAMENT RECONSTRUCTION Right   . LEFT HEART CATHETERIZATION WITH CORONARY ANGIOGRAM N/A 08/08/2012   Procedure: LEFT HEART CATHETERIZATION WITH CORONARY ANGIOGRAM;  Surgeon: [name], MD;  Location: [facility] CATH LAB;  Service: Cardiovascular;  Laterality: N/A;  . NASAL SINUS SURGERY    . PERCUTANEOUS CORONARY STENT INTERVENTION (PCI-S) N/A 08/21/2012   Procedure: PERCUTANEOUS CORONARY STENT INTERVENTION (PCI-S);  Surgeon: [name], MD;  Location: [facility] CATH LAB;  Service: Cardiovascular;  Laterality: N/A;  . TUBAL LIGATION       reports that she quit smoking about 21 years ago. Her smoking use included cigarettes. She started smoking about 59 years ago. She has a 120.00 pack-year smoking history. She has never used smokeless tobacco. She reports that she does not drink alcohol or use drugs.  Allergies  Allergen Reactions  . Ace Inhibitors Other (See Comments)    Coincided with sig bump in creat. Retried and creat bumped again.   . Isosorb Dinitrate-Hydralazine Other (See Comments)    Sleep all the time.   Marland Kitchen Penicillins Anaphylaxis, Swelling and Rash    Has patient had a PCN reaction causing immediate rash, facial/tongue/throat swelling, SOB or lightheadedness with hypotension: Yes Has patient had a PCN reaction causing severe rash involving mucus membranes or skin necrosis: Yes Has patient had a PCN reaction that required hospitalization: Yes Has patient had a PCN reaction occurring within the last 10 years: No If all of the above answers are "NO", then may proceed with Cephalosporin use.   Marland Kitchen Buspirone Other (See Comments)    pain   . Pregabalin Swelling  . Ropinirole Hydrochloride Swelling  . Amantadines Rash    "Swelling of the tongue"    Family History  Problem  Relation Age of Onset  . Heart disease Mother   . Diabetes Mother   . Clotting disorder Mother   . Pneumonia Father   . Rheum arthritis Father   . Diabetes Sister   . Diabetes Sister   . Asthma Brother   . Cancer Brother   . Kidney disease Brother   . Lupus Son   . Heart disease Son   . Heart disease Daughter   . Colon cancer Neg Hx      Prior to Admission medications   Medication Sig Start Date End Date Taking? Authorizing Provider  acetaminophen (TYLENOL) 500 MG tablet Take 500 mg by mouth every 6 (six) hours as needed for moderate pain.   Yes [provider]  allopurinol (ZYLOPRIM) 100 MG tablet Take 1 tablet (100 mg total) by mouth daily. 10/17/19  Yes [name], [name], MD  amLODipine (NORVASC) 5 MG tablet Take 1 tablet (5 mg total)  by mouth daily. 10/17/19  Yes [name], [name], MD  Ascorbic Acid (VITAMIN C PO) Take 1 tablet by mouth daily.   Yes [provider]  aspirin EC 81 MG tablet Take 81 mg by mouth daily.    Yes [provider]  atorvastatin (LIPITOR) 20 MG tablet Take 1 tablet (20 mg total) by mouth daily. 03/27/19  Yes [name], [name], MD  Cholecalciferol (VITAMIN D3) 5000 UNITS CAPS Take 5,000 Units by mouth daily.   Yes [provider]  DULoxetine (CYMBALTA) 60 MG capsule Take 1 capsule (60 mg total) by mouth daily. 10/20/19  Yes [name], [name], MD  ferrous sulfate 325 (65 FE) MG tablet Take 325 mg by mouth daily with breakfast.   Yes [provider]  Fluocinolone Acetonide Scalp 0.01 % OIL Apply 1 application topically at bedtime as needed (itching scalp).  07/30/19  Yes [provider]  furosemide (LASIX) 40 MG tablet Take 1 tablet (40 mg total) by mouth daily. Patient taking differently: Take 20 mg by mouth daily.  11/20/19  Yes [name], [name], MD  gabapentin (NEURONTIN) 100 MG capsule Take 1 capsule (100 mg total) by mouth 2 (two) times daily. 11/08/19  Yes Shelly Coss, MD  guaiFENesin-dextromethorphan (ROBITUSSIN  DM) 100-10 MG/5ML syrup Take 5 mLs by mouth every 4 (four) hours as needed for cough. 11/08/19  Yes Shelly Coss, MD  Insulin NPH, Human,, Isophane, (HUMULIN N KWIKPEN) 100 UNIT/ML Kiwkpen 30 U am and 10 U pm 11/14/19  Yes [name], [name], MD  isosorbide mononitrate (IMDUR) 30 MG 24 hr tablet Take 1 tablet (30 mg total) by mouth daily. 11/08/19 02/06/20 Yes Shelly Coss, MD  levETIRAcetam (KEPPRA) 500 MG tablet Take 1 tablet (500 mg total) by mouth 2 (two) times daily. 07/30/19  Yes Suzzanne Cloud, NP  linaclotide [facility]) 145 MCG CAPS capsule Take 1 capsule (145 mcg total) by mouth daily before breakfast. Patient taking differently: Take 145 mcg by mouth daily as needed (abdominal cramping).  10/17/19  Yes [name], [name], MD  memantine (NAMENDA) 10 MG tablet Take 1 tablet (10 mg total) by mouth 2 (two) times daily. 04/05/19  Yes Marcial Pacas, MD  metoprolol succinate (TOPROL-XL) 50 MG 24 hr tablet Take 1 tablet (50 mg total) by mouth daily. Take with or immediately following a meal. 06/11/19  Yes [name], [name], MD  Omega-3 Fatty Acids (FISH OIL) 1000 MG CAPS Take 1,000 mg by mouth daily.    Yes [provider]  pantoprazole (PROTONIX) 40 MG tablet TAKE 1 TABLET(40 MG) BY MOUTH DAILY Patient taking differently: Take 40 mg by mouth daily.  08/28/19  Yes [name], [name], MD  Polyethyl Glycol-Propyl Glycol (SYSTANE) 0.4-0.3 % GEL ophthalmic gel Place 1 application into both eyes every 4 (four) hours as needed (dry eyes).   Yes [provider]  PROAIR HFA 108 (90 Base) MCG/ACT inhaler INHALE 2 PUFF BY MOUTH EVERY 4 HOURS AS NEEDED USE ONLY IF YOU ARE WHEEZING Patient taking differently: Inhale 2 puffs into the lungs every 4 (four) hours as needed for wheezing.  10/17/19  Yes [name], [name], MD  Accu-Chek Softclix Lancets lancets Use as instructed 03/15/19   [name], [name], MD  B Complex Vitamins (VITAMIN B COMPLEX) TABS Take 1 tablet by mouth daily.    [provider]    budesonide-formoterol (SYMBICORT) 80-4.5 MCG/ACT inhaler Inhale 2 puffs into the lungs 2 (two) times daily. 07/17/19   [name], [name], MD  glucose blood (ACCU-CHEK AVIVA PLUS) test strip Use to test blood sugar three times daily 03/27/19   [name], [name], MD  Insulin Pen Needle (B-D ULTRAFINE III SHORT PEN) 31G X 8 MM MISC CHECK BLOOD SUGAR 3 TIMES A DAY 03/15/19   [name], [name], MD  montelukast (SINGULAIR) 10 MG tablet Take 1 tablet (10 mg total) by mouth at bedtime. 03/14/19   [name], [name], MD  sodium bicarbonate 650 MG tablet Take 1 tablet (650 mg total) by mouth 2 (two) times daily. Patient not taking: Reported on 12/05/2019 11/08/19   Shelly Coss, MD  sodium zirconium cyclosilicate (LOKELMA) 5 g packet Take 5 g by mouth daily. Patient not taking: Reported on 12/05/2019 11/09/19   Shelly Coss, MD  Spacer/Aero-Holding Chambers (AEROCHAMBER PLUS) inhaler Use as instructed to use with inahaler. 07/17/19   [name], [name], MD    Physical Exam: Vitals:   12/05/19 1333 12/05/19 1404 12/05/19 1430 12/05/19 1500  BP: 118/80  124/76 133/69  Pulse: 68  (!) 59 62  Resp: 17  19 16   Temp:      TempSrc:      SpO2: 100% (!) 84% 100% 99%    Constitutional: NAD, calm, comfortable Vitals:   12/05/19 1333 12/05/19 1404 12/05/19 1430 12/05/19 1500  BP: 118/80  124/76 133/69  Pulse: 68  (!) 59 62  Resp: 17  19 16   Temp:      TempSrc:      SpO2: 100% (!) 84% 100% 99%   Eyes: PERRL, lids and conjunctivae normal ENMT: Mucous membranes are moist. Posterior pharynx clear of any exudate or lesions.Normal dentition.  Neck: normal, supple, no masses, no thyromegaly Respiratory: clear to auscultation bilaterally, fine crackles on bilateral bases.  Increased respiratory effort. No accessory muscle use.  Cardiovascular: Regular rate and rhythm, no murmurs / rubs / gallops.  1+ extremity edema. 2+ pedal pulses. No carotid bruits.  Abdomen: no tenderness, no masses palpated. No hepatosplenomegaly. Bowel  sounds positive.  Musculoskeletal: no clubbing / cyanosis. No joint deformity upper and lower extremities. Good ROM, no contractures. Normal muscle tone.  Skin: no rashes, lesions, ulcers. No induration Neurologic: CN 2-12 grossly intact. Sensation intact, DTR normal. Strength 5/5 in all 4.  Psychiatric: Normal judgment and insight. Alert and oriented x 3. Normal mood.     Labs on Admission: I have personally reviewed following labs and imaging studies  CBC: Recent Labs  Lab 12/05/19 1139  WBC 10.8*  NEUTROABS 7.6  HGB 10.3*  HCT 31.0*  MCV 75.4*  PLT 353   Basic Metabolic Panel: Recent Labs  Lab 12/05/19 1139  NA 141  K 3.8  CL 107  CO2 24  GLUCOSE 151*  BUN 23  CREATININE 1.58*  CALCIUM 8.7*   GFR: CrCl cannot be calculated (Unknown ideal weight.). Liver Function Tests: Recent Labs  Lab 12/05/19 1139  AST 27  ALT 18  ALKPHOS 100  BILITOT 0.7  PROT 6.5  ALBUMIN 2.5*   Recent Labs  Lab 12/05/19 1139  LIPASE 30   No results for input(s): AMMONIA in the last 168 hours. Coagulation Profile: No results for input(s): INR, PROTIME in the last 168 hours. Cardiac Enzymes: No results for input(s): CKTOTAL, CKMB, CKMBINDEX, TROPONINI in the last 168 hours. BNP (last 3 results) No results for input(s): PROBNP in the last 8760 hours. HbA1C: No results for input(s): HGBA1C in the last 72 hours. CBG: No results for input(s): GLUCAP in the last 168 hours. Lipid Profile:  No results for input(s): CHOL, HDL, LDLCALC, TRIG, CHOLHDL, LDLDIRECT in the last 72 hours. Thyroid Function Tests: No results for input(s): TSH, T4TOTAL, FREET4, T3FREE, THYROIDAB in the last 72 hours. Anemia Panel: No results for input(s): VITAMINB12, FOLATE, FERRITIN, TIBC, IRON, RETICCTPCT in the last 72 hours. Urine analysis:    Component Value Date/Time   COLORURINE YELLOW 12/05/2019 1406   APPEARANCEUR CLEAR 12/05/2019 1406   LABSPEC 1.008 12/05/2019 1406   PHURINE 5.0 12/05/2019 1406    GLUCOSEU NEGATIVE 12/05/2019 1406   HGBUR NEGATIVE 12/05/2019 1406   HGBUR negative 02/26/2009 1314   BILIRUBINUR NEGATIVE 12/05/2019 1406   BILIRUBINUR NEG 04/05/2014 1455   KETONESUR NEGATIVE 12/05/2019 1406   PROTEINUR NEGATIVE 12/05/2019 1406   UROBILINOGEN 0.2 08/01/2014 2305   NITRITE NEGATIVE 12/05/2019 1406   LEUKOCYTESUR NEGATIVE 12/05/2019 1406    Radiological Exams on Admission: CT ABDOMEN PELVIS WO CONTRAST  Result Date: 12/05/2019 CLINICAL DATA:  Right lower quadrant abdominal pain. EXAM: CT ABDOMEN AND PELVIS WITHOUT CONTRAST TECHNIQUE: Multidetector CT imaging of the abdomen and pelvis was performed following the standard protocol without IV contrast. COMPARISON:  October 17, 2013. FINDINGS: Lower chest: Minimal bilateral pleural effusions are noted with adjacent subsegmental atelectasis. Hepatobiliary: No focal liver abnormality is seen. Status post cholecystectomy. No biliary dilatation. Pancreas: Unremarkable. No pancreatic ductal dilatation or surrounding inflammatory changes. Spleen: Chronic peripheral calcifications are noted and unchanged consistent with old hematoma or infarction. Adrenals/Urinary Tract: Adrenal glands appear normal. Stable large right renal cysts are noted. No hydronephrosis or renal obstruction is noted. No renal or ureteral calculi are noted. Urinary bladder is unremarkable. Stomach/Bowel: Stomach is within normal limits. Appendix appears normal. No evidence of bowel wall thickening, distention, or inflammatory changes. Vascular/Lymphatic: Aortic atherosclerosis. No enlarged abdominal or pelvic lymph nodes. Reproductive: Calcified uterine fibroids are again noted. No adnexal abnormality is noted. Other: No abdominal wall hernia or abnormality. No abdominopelvic ascites. Musculoskeletal: No acute or significant osseous findings. IMPRESSION: 1. Minimal bilateral pleural effusions are noted with adjacent subsegmental atelectasis. 2. Stable large right renal  cysts. 3. Calcified uterine fibroids. 4. No acute abnormality seen in the abdomen or pelvis. Aortic Atherosclerosis (ICD10-I70.0). Electronically Signed   By: Marijo Conception M.D.   On: 12/05/2019 13:45   DG Chest 2 View  Result Date: 12/05/2019 CLINICAL DATA:  Leg swelling, shortness of breath EXAM: CHEST - 2 VIEW COMPARISON:  11/05/2019 FINDINGS: Stable cardiomegaly. Atherosclerotic calcification of the aortic knob. Pulmonary vascular congestion with prominent interstitial markings bilaterally. Small bilateral pleural effusions. No pneumothorax. IMPRESSION: Cardiomegaly with pulmonary vascular congestion and prominent interstitial markings bilaterally, concerning for CHF with mild edema. Electronically Signed   By: Davina Poke D.O.   On: 12/05/2019 13:36    EKG: Independently reviewed.  Sinus rhythm, low voltage, prolonged QTC  Assessment/Plan Active Problems:   CHF (congestive heart failure) (HCC)  Acute on chronic combined systolic and diastolic CHF decompensation -Fluid overload, received 1 dose of IV Lasix in the ED, breathing symptoms somewhat improved.  Probably can resume p.o. Lasix tomorrow. -Discussed with the patient daughter at bedside regarding at home adjustment of Lasix dosage according patient weight, patient daughter said patient used to have daily weight by home care nurse which was stopped about 1 week ago.  Likely patient will probably need probably around Lasix dosage to maintain a euvolemic and stability of kidney function.  Patient daughter understood and agreed.  Abdominal pain -Probably related to abdominal wall swelling from fluid overload, CT abdomen reassuring  CKD stage III -Creatinine level probably around baseline, however now has symptoms signs of fluid overload, SPECT to return to p.o. Lasix tomorrow after today's IV Lasix.  Abnormal EKG with prolonged QTC -Repeat EKG tomorrow -Low-voltage EKG is a chronic  IDDM -Sliding scale  Debility -Worsened  by CHF decompensation, diuresis and PT evaluation tomorrow.   Hypothyroidism -Continue Synthroid    DVT prophylaxis: Heparin subcu Code Status: DNR Family Communication: Daughter at bedside Disposition Plan: Patient improved in the ED after IV Lasix, expect less than 2 midnight hospital stay, but will need PT evaluation and recommendation Consults called: None Admission status: Telemetry observation   Lequita Halt MD [facility] Pager [phone]   12/05/2019, 3:46 PM

## 2019-12-05 NOTE — ED Triage Notes (Signed)
Pt here from home for abd pain and sob. Pt states that abd pain and sob has now resolved/ Pt wears 2L O2 via [place] at baseline, tubing was damaged so pt did not wear it all night. Upon Ems arrival pt O2 sat was 88% on RA, increased to 98% on 2L. VSS aox4  155/73 90 NSR 18 RR CBG 211

## 2019-12-05 NOTE — ED Notes (Signed)
Pt transported to CT ?

## 2019-12-05 NOTE — ED Notes (Signed)
Got patient into a gown on the monitor did ekg shown to Dr Regenia Skeeter

## 2019-12-05 NOTE — Telephone Encounter (Signed)
Disregard

## 2019-12-05 NOTE — ED Provider Notes (Signed)
[facility] EMERGENCY DEPARTMENT Provider Note   CSN: [id number] Arrival date & time: 12/05/19  1113     History Chief Complaint  Patient presents with  . Abdominal Pain  . Shortness of Breath    Brittany Roberts is a 79 y.o. female.  HPI 79 year old female presents with abdominal pain and shortness of breath.  History is from patient but also the daughter at the bedside.  The patient has been cutting her Lasix dose for about a week due to kidney injury.  However they started adding back her full dose Lasix about 4 5 days ago due to leg swelling or shortness of breath.  Noticed abdominal pain on the right side starting 2 days ago.  Seems to be better but is still present.  No fevers though her temperature was 99 when checked in the emergency department.  This morning, patient was short of breath and her oxygen tubing was not working correctly.  She had a heaviness to her chest as well.  This resolved when EMS was able to apply her typical 2 L nasal cannula.   Past Medical History:  Diagnosis Date  . Acute delirium 01/14/2017  . Anxiety   . Arthritis    "all over"  . Asthma   . Bipolar disorder ([place])   . Complication of anesthesia    "w/right foot OR they gave me too much and they couldn't get me woke"  . Congestive heart failure, unspecified   . Coronary artery calcification seen on CAT scan 01/14/2017  . Coronary artery disease    "I've got 1 stent" (06/30/2015)  . Depression   . Discoid lupus   . Fibromyalgia    "I've been told I have this; can't take Lyrica cause I'm allergic to it" (06/30/2015)  . GERD (gastroesophageal reflux disease)   . History of gout   . History of hiatal hernia    "real bad" (06/30/2015)  . Hypertension   . Hypothyroidism   . Memory loss   . On home oxygen therapy    "2L q hs" (06/30/2015)  . OSA (obstructive sleep apnea)    "just use my oxygen; no mask" (06/30/2015)  . Other and unspecified hyperlipidemia   . Penetrating foot  wound    left nonhealing foot wound on the dorsal surface  . Peripheral neuropathy   . Pneumonia "many of times"  . Refusal of blood transfusions as patient is Jehovah's Witness   . Renal failure, unspecified    "kidneys not working 100%" (06/30/2015)  . Seizure ([place])   . Sickle cell trait ([place])   . Systemic lupus ([place])   . Thoracic aortic atherosclerosis ([place]) 01/14/2017  . Type II diabetes mellitus ([place])   . Unspecified vitamin D deficiency     Patient Active Problem List   Diagnosis Date Noted  . CHF (congestive heart failure) ([place]) 12/05/2019  . Pneumonia due to COVID-19 virus 11/01/2019  . Respiratory failure ([place]) 11/01/2019  . Memory loss 04/05/2019  . Cerebrovascular accident (CVA) ([place]) 04/05/2019  . Seizures ([place]) 04/05/2019  . Obstructive sleep apnea 04/05/2019  . Chronic heart failure with preserved ejection fraction ([place]) 01/17/2019  . BP (high blood pressure) 01/01/2019  . OP (osteoporosis) 01/01/2019  . Abdominal wall pain in right flank 08/13/2018  . Alopecia, scarring 08/11/2018  . Chronic right shoulder pain 03/15/2018  . Bilateral primary osteoarthritis of knee 02/09/2018  . Pain in right ankle and joints of right foot 02/09/2018  . Acute exacerbation of  congestive heart failure ([place]) 12/13/2017  . AKI (acute kidney injury) ([place]) 09/01/2017  . Gout, arthropathy 09/01/2017  . Impacted cerumen of both ears 05/13/2017  . Sensorineural hearing loss, bilateral 05/13/2017  . Localization-related symptomatic epilepsy and epileptic syndromes with complex partial seizures, not intractable, without status epilepticus ([place]) 04/25/2017  . Fibromyalgia 04/08/2017  . Allergic rhinitis 03/11/2017  . Cardiomyopathy ([place]) 01/22/2017  . Hyperlipidemia associated with type 2 diabetes mellitus ([place]) 01/22/2017  . Bipolar disorder ([place]) 01/22/2017  . Seizure ([place])   . Thoracic aortic atherosclerosis ([place]) 01/14/2017  . Coronary artery calcification seen on CAT scan  01/14/2017  . Acute delirium 01/14/2017  . Possible Tonic-clonic seizure disorder ([place]) 01/14/2017  . CVA (cerebral vascular accident) ([place]) 01/14/2017  . Altered mental status 01/13/2017  . Mastalgia 04/20/2016  . Bilateral leg edema 02/10/2016  . Difficulty hearing 10/30/2015  . Midline low back pain with right-sided sciatica 09/25/2015  . Carpal tunnel syndrome on left 08/14/2015  . Chest pain of uncertain etiology 85/63/1497  . Primary localized osteoarthrosis, hand 06/19/2015  . Radial styloid tenosynovitis 06/19/2015  . Dysarthria 02/14/2015  . Schizophrenia, unspecified type ([place]) 10/25/2014  . Type 2 diabetes mellitus with complication ([place])   . Cephalalgia   . Hx of systemic lupus erythematosus (SLE) ([place])   . Thyroid mass 04/26/2014  . Constipation 12/10/2013  . Benign neoplasm of colon 12/10/2013  . Type 2 diabetes mellitus with diabetic neuropathy, unspecified ([place]) 11/27/2013  . Cystic kidney disease 11/09/2013  . Insulin dependent type 2 diabetes mellitus ([place]) 10/29/2013  . Hoarseness or changing voice 09/27/2013  . Dyskinesia, tardive 07/11/2013  . TMJ syndrome 02/16/2013  . Angina pectoris associated with type 2 diabetes mellitus ([place]) 08/08/2012  . Coronary artery disease of native artery of native heart with stable angina pectoris ([place]) 08/08/2012  . Asthma, chronic, PRESUMED wiht MULTIFACTORIAL DYSPNEA 05/30/2012  . Insomnia 09/22/2011  . Mobility poor 02/02/2011  . Chronic kidney disease, stage III (moderate) 05/13/2010  . Chronic combined systolic and diastolic CHF, NYHA class 2 ([place]) 03/31/2010  . COPD (chronic obstructive pulmonary disease) ([place]) 03/26/2010  . Morbid obesity ([place]) 02/27/2010  . Unstable gait 02/27/2010  . DM (diabetes mellitus), type 2 with neurological complications ([place]) 02/63/7858  . Arthritis 04/25/2009  . Anemia 03/11/2007  . COGNITIVE IMPAIRMENT, MILD, SO STATED 03/11/2007  . RESTLESS LEG SYNDROME 03/11/2007  . Lupus ([place])  03/11/2007  . OSTEOARTHROSIS, GENERALIZED, MULTIPLE SITES 03/11/2007  . Hypothyroidism 03/07/2007  . Hypercholesteremia 03/07/2007  . Depression 03/07/2007  . Hypertensive heart disease with heart failure ([place]) 03/07/2007  . Coronary atherosclerosis 03/07/2007  . GERD 03/07/2007  . SLEEP APNEA 03/07/2007    Past Surgical History:  Procedure Laterality Date  . ANKLE ARTHROSCOPY WITH OPEN REDUCTION INTERNAL FIXATION (ORIF) Right 03/2011  . CARDIAC CATHETERIZATION N/A 07/01/2015   Procedure: Left Heart Cath and Coronary Angiography;  Surgeon: Adrian Prows, MD;  Location: [facility] CV LAB;  Service: Cardiovascular;  Laterality: N/A;  . CARDIAC CATHETERIZATION N/A 07/01/2015   Procedure: Intravascular Pressure Wire/FFR Study;  Surgeon: Adrian Prows, MD;  Location: [facility] CV LAB;  Service: Cardiovascular;  Laterality: N/A;  . CARPAL TUNNEL RELEASE Bilateral   . CATARACT EXTRACTION W/ INTRAOCULAR LENS  IMPLANT, BILATERAL Bilateral   . CHOLECYSTECTOMY OPEN    . COLONOSCOPY N/A 12/10/2013   Procedure: COLONOSCOPY;  Surgeon: Inda Castle, MD;  Location: [facility]OS[facility];  Service: Endoscopy;  Laterality: N/A;  . FRACTURE SURGERY    . INCISION AND DRAINAGE  ABSCESS Left    foot "under my little toe"  . KNEE LIGAMENT RECONSTRUCTION Right   . LEFT HEART CATHETERIZATION WITH CORONARY ANGIOGRAM N/A 08/08/2012   Procedure: LEFT HEART CATHETERIZATION WITH CORONARY ANGIOGRAM;  Surgeon: [name], MD;  Location: [facility] CATH LAB;  Service: Cardiovascular;  Laterality: N/A;  . NASAL SINUS SURGERY    . PERCUTANEOUS CORONARY STENT INTERVENTION (PCI-S) N/A 08/21/2012   Procedure: PERCUTANEOUS CORONARY STENT INTERVENTION (PCI-S);  Surgeon: [name], MD;  Location: [facility] CATH LAB;  Service: Cardiovascular;  Laterality: N/A;  . TUBAL LIGATION       OB History   No obstetric history on file.     Family History  Problem Relation Age of Onset  . Heart disease Mother   . Diabetes Mother   . Clotting  disorder Mother   . Pneumonia Father   . Rheum arthritis Father   . Diabetes Sister   . Diabetes Sister   . Asthma Brother   . Cancer Brother   . Kidney disease Brother   . Lupus Son   . Heart disease Son   . Heart disease Daughter   . Colon cancer Neg Hx     Social History   Tobacco Use  . Smoking status: Former Smoker    Packs/day: 3.00    Years: 40.00    Pack years: 120.00    Types: Cigarettes    Start date: 07/05/1960    Quit date: 07/05/1998    Years since quitting: 21.4  . Smokeless tobacco: Never Used  Substance Use Topics  . Alcohol use: No    Alcohol/week: 0.0 standard drinks    Comment: havent drank in over 40 years  . Drug use: No    Home Medications Prior to Admission medications   Medication Sig Start Date End Date Taking? Authorizing Provider  acetaminophen (TYLENOL) 500 MG tablet Take 500 mg by mouth every 6 (six) hours as needed for moderate pain.   Yes [provider]  allopurinol (ZYLOPRIM) 100 MG tablet Take 1 tablet (100 mg total) by mouth daily. 10/17/19  Yes [name], [name], MD  amLODipine (NORVASC) 5 MG tablet Take 1 tablet (5 mg total) by mouth daily. 10/17/19  Yes [name], [name], MD  Ascorbic Acid (VITAMIN C PO) Take 1 tablet by mouth daily.   Yes [provider]  aspirin EC 81 MG tablet Take 81 mg by mouth daily.    Yes [provider]  atorvastatin (LIPITOR) 20 MG tablet Take 1 tablet (20 mg total) by mouth daily. 03/27/19  Yes [name], [name], MD  Cholecalciferol (VITAMIN D3) 5000 UNITS CAPS Take 5,000 Units by mouth daily.   Yes [provider]  DULoxetine (CYMBALTA) 60 MG capsule Take 1 capsule (60 mg total) by mouth daily. 10/20/19  Yes [name], [name], MD  ferrous sulfate 325 (65 FE) MG tablet Take 325 mg by mouth daily with breakfast.   Yes [provider]  Fluocinolone Acetonide Scalp 0.01 % OIL Apply 1 application topically at bedtime as needed (itching scalp).  07/30/19  Yes [provider]    furosemide (LASIX) 40 MG tablet Take 1 tablet (40 mg total) by mouth daily. Patient taking differently: Take 20 mg by mouth daily.  11/20/19  Yes [name], [name], MD  gabapentin (NEURONTIN) 100 MG capsule Take 1 capsule (100 mg total) by mouth 2 (two) times daily. 11/08/19  Yes Adhikari, Tamsen Meek, MD  guaiFENesin-dextromethorphan (ROBITUSSIN DM) 100-10 MG/5ML syrup Take 5 mLs  by mouth every 4 (four) hours as needed for cough. 11/08/19  Yes Shelly Coss, MD  Insulin NPH, Human,, Isophane, (HUMULIN N KWIKPEN) 100 UNIT/ML Kiwkpen 30 U am and 10 U pm 11/14/19  Yes [name], [name], MD  isosorbide mononitrate (IMDUR) 30 MG 24 hr tablet Take 1 tablet (30 mg total) by mouth daily. 11/08/19 02/06/20 Yes Shelly Coss, MD  levETIRAcetam (KEPPRA) 500 MG tablet Take 1 tablet (500 mg total) by mouth 2 (two) times daily. 07/30/19  Yes Suzzanne Cloud, NP  linaclotide [facility]) 145 MCG CAPS capsule Take 1 capsule (145 mcg total) by mouth daily before breakfast. Patient taking differently: Take 145 mcg by mouth daily as needed (abdominal cramping).  10/17/19  Yes [name], [name], MD  memantine (NAMENDA) 10 MG tablet Take 1 tablet (10 mg total) by mouth 2 (two) times daily. 04/05/19  Yes Marcial Pacas, MD  metoprolol succinate (TOPROL-XL) 50 MG 24 hr tablet Take 1 tablet (50 mg total) by mouth daily. Take with or immediately following a meal. 06/11/19  Yes [name], [name], MD  Omega-3 Fatty Acids (FISH OIL) 1000 MG CAPS Take 1,000 mg by mouth daily.    Yes [provider]  pantoprazole (PROTONIX) 40 MG tablet TAKE 1 TABLET(40 MG) BY MOUTH DAILY Patient taking differently: Take 40 mg by mouth daily.  08/28/19  Yes [name], [name], MD  Polyethyl Glycol-Propyl Glycol (SYSTANE) 0.4-0.3 % GEL ophthalmic gel Place 1 application into both eyes every 4 (four) hours as needed (dry eyes).   Yes [provider]  PROAIR HFA 108 (90 Base) MCG/ACT inhaler INHALE 2 PUFF BY MOUTH EVERY 4 HOURS AS NEEDED USE ONLY IF YOU ARE  WHEEZING Patient taking differently: Inhale 2 puffs into the lungs every 4 (four) hours as needed for wheezing.  10/17/19  Yes [name], [name], MD  Accu-Chek Softclix Lancets lancets Use as instructed 03/15/19   [name], [name], MD  B Complex Vitamins (VITAMIN B COMPLEX) TABS Take 1 tablet by mouth daily.    [provider]  budesonide-formoterol (SYMBICORT) 80-4.5 MCG/ACT inhaler Inhale 2 puffs into the lungs 2 (two) times daily. 07/17/19   [name], [name], MD  glucose blood (ACCU-CHEK AVIVA PLUS) test strip Use to test blood sugar three times daily 03/27/19   [name], [name], MD  Insulin Pen Needle (B-D ULTRAFINE III SHORT PEN) 31G X 8 MM MISC CHECK BLOOD SUGAR 3 TIMES A DAY 03/15/19   [name], [name], MD  montelukast (SINGULAIR) 10 MG tablet Take 1 tablet (10 mg total) by mouth at bedtime. 03/14/19   [name], [name], MD  sodium bicarbonate 650 MG tablet Take 1 tablet (650 mg total) by mouth 2 (two) times daily. Patient not taking: Reported on 12/05/2019 11/08/19   Shelly Coss, MD  sodium zirconium cyclosilicate (LOKELMA) 5 g packet Take 5 g by mouth daily. Patient not taking: Reported on 12/05/2019 11/09/19   Shelly Coss, MD  Spacer/Aero-Holding Chambers (AEROCHAMBER PLUS) inhaler Use as instructed to use with inahaler. 07/17/19   [name], [name], MD    Allergies    Ace inhibitors, Isosorb dinitrate-hydralazine, Penicillins, Buspirone, Pregabalin, Ropinirole hydrochloride, and Amantadines  Review of Systems   Review of Systems  Constitutional: Negative for fever.  Respiratory: Positive for cough and shortness of breath.   Cardiovascular: Positive for chest pain and leg swelling.  Gastrointestinal: Positive for abdominal pain. Negative for vomiting.  Genitourinary: Negative for dysuria.  All other systems reviewed and are negative.  Physical Exam Updated Vital Signs BP 103/62 (BP Location: Left Wrist)   Pulse (!) 59   Temp 99 F (37.2 C) (Oral)   Resp 18   SpO2 100%    Physical Exam Vitals and nursing note reviewed.  Constitutional:      General: She is not in acute distress.    Appearance: She is well-developed. She is obese. She is not ill-appearing or diaphoretic.  HENT:     Head: Normocephalic and atraumatic.     Right Ear: External ear normal.     Left Ear: External ear normal.     Nose: Nose normal.  Eyes:     General:        Right eye: No discharge.        Left eye: No discharge.  Cardiovascular:     Rate and Rhythm: Normal rate and regular rhythm.     Heart sounds: Normal heart sounds.  Pulmonary:     Effort: Pulmonary effort is normal. No tachypnea or accessory muscle usage.     Breath sounds: Examination of the right-lower field reveals rales. Examination of the left-lower field reveals rales. Rales (mild) present.  Abdominal:     Palpations: Abdomen is soft.     Tenderness: There is abdominal tenderness in the right upper quadrant and right lower quadrant.  Musculoskeletal:     Right lower leg: No edema.     Left lower leg: No edema.  Skin:    General: Skin is warm and dry.  Neurological:     Mental Status: She is alert.  Psychiatric:        Mood and Affect: Mood is not anxious.     ED Results / Procedures / Treatments   Labs (all labs ordered are listed, but only abnormal results are displayed) Labs Reviewed  COMPREHENSIVE METABOLIC PANEL - Abnormal; Notable for the following components:      Result Value   Glucose, Bld 151 (*)    Creatinine, Ser 1.58 (*)    Calcium 8.7 (*)    Albumin 2.5 (*)    GFR calc non Af Amer 31 (*)    GFR calc Af Amer 35 (*)    All other components within normal limits  BRAIN NATRIURETIC PEPTIDE - Abnormal; Notable for the following components:   B Natriuretic Peptide 505.5 (*)    All other components within normal limits  CBC WITH DIFFERENTIAL/PLATELET - Abnormal; Notable for the following components:   WBC 10.8 (*)    Hemoglobin 10.3 (*)    HCT 31.0 (*)    MCV 75.4 (*)    MCH 25.1  (*)    nRBC 0.3 (*)    Abs Immature Granulocytes 0.13 (*)    All other components within normal limits  TROPONIN I (HIGH SENSITIVITY) - Abnormal; Notable for the following components:   Troponin I (High Sensitivity) 20 (*)    All other components within normal limits  TROPONIN I (HIGH SENSITIVITY) - Abnormal; Notable for the following components:   Troponin I (High Sensitivity) 22 (*)    All other components within normal limits  LIPASE, BLOOD  URINALYSIS, ROUTINE W REFLEX MICROSCOPIC    EKG EKG Interpretation  Date/Time:  Wednesday December 05 2019 11:16:08 EDT Ventricular Rate:  81 PR Interval:    QRS Duration: 92 QT Interval:  472 QTC Calculation: 535 R Axis:   -43 Text Interpretation: Sinus rhythm Multiform ventricular premature complexes Inferior infarct, old Consider anterior infarct Lateral leads are also involved Prolonged QT  interval similar to April 2021 Confirmed by [place]wood Gambler [phone]) on 12/05/2019 11:22:23 AM   Radiology CT ABDOMEN PELVIS WO CONTRAST  Result Date: 12/05/2019 CLINICAL DATA:  Right lower quadrant abdominal pain. EXAM: CT ABDOMEN AND PELVIS WITHOUT CONTRAST TECHNIQUE: Multidetector CT imaging of the abdomen and pelvis was performed following the standard protocol without IV contrast. COMPARISON:  October 17, 2013. FINDINGS: Lower chest: Minimal bilateral pleural effusions are noted with adjacent subsegmental atelectasis. Hepatobiliary: No focal liver abnormality is seen. Status post cholecystectomy. No biliary dilatation. Pancreas: Unremarkable. No pancreatic ductal dilatation or surrounding inflammatory changes. Spleen: Chronic peripheral calcifications are noted and unchanged consistent with old hematoma or infarction. Adrenals/Urinary Tract: Adrenal glands appear normal. Stable large right renal cysts are noted. No hydronephrosis or renal obstruction is noted. No renal or ureteral calculi are noted. Urinary bladder is unremarkable. Stomach/Bowel: Stomach is  within normal limits. Appendix appears normal. No evidence of bowel wall thickening, distention, or inflammatory changes. Vascular/Lymphatic: Aortic atherosclerosis. No enlarged abdominal or pelvic lymph nodes. Reproductive: Calcified uterine fibroids are again noted. No adnexal abnormality is noted. Other: No abdominal wall hernia or abnormality. No abdominopelvic ascites. Musculoskeletal: No acute or significant osseous findings. IMPRESSION: 1. Minimal bilateral pleural effusions are noted with adjacent subsegmental atelectasis. 2. Stable large right renal cysts. 3. Calcified uterine fibroids. 4. No acute abnormality seen in the abdomen or pelvis. Aortic Atherosclerosis (ICD10-I70.0). Electronically Signed   By: Marijo Conception M.D.   On: 12/05/2019 13:45   DG Chest 2 View  Result Date: 12/05/2019 CLINICAL DATA:  Leg swelling, shortness of breath EXAM: CHEST - 2 VIEW COMPARISON:  11/05/2019 FINDINGS: Stable cardiomegaly. Atherosclerotic calcification of the aortic knob. Pulmonary vascular congestion with prominent interstitial markings bilaterally. Small bilateral pleural effusions. No pneumothorax. IMPRESSION: Cardiomegaly with pulmonary vascular congestion and prominent interstitial markings bilaterally, concerning for CHF with mild edema. Electronically Signed   By: Davina Poke D.O.   On: 12/05/2019 13:36    Procedures Procedures (including critical care time)  Medications Ordered in ED Medications  acetaminophen (TYLENOL) tablet 500 mg (has no administration in time range)  allopurinol (ZYLOPRIM) tablet 100 mg (has no administration in time range)  aspirin EC tablet 81 mg (has no administration in time range)  amLODipine (NORVASC) tablet 5 mg (has no administration in time range)  atorvastatin (LIPITOR) tablet 20 mg (has no administration in time range)  furosemide (LASIX) tablet 40 mg (has no administration in time range)  isosorbide mononitrate (IMDUR) 24 hr tablet 30 mg (has no  administration in time range)  metoprolol succinate (TOPROL-XL) 24 hr tablet 50 mg (has no administration in time range)  DULoxetine (CYMBALTA) DR capsule 60 mg (has no administration in time range)  memantine (NAMENDA) tablet 10 mg (has no administration in time range)  linaclotide (LINZESS) capsule 145 mcg (has no administration in time range)  pantoprazole (PROTONIX) EC tablet 40 mg (has no administration in time range)  ferrous sulfate tablet 325 mg (has no administration in time range)  gabapentin (NEURONTIN) capsule 100 mg (has no administration in time range)  levETIRAcetam (KEPPRA) tablet 500 mg (has no administration in time range)  ascorbic acid (VITAMIN C) tablet 250 mg (has no administration in time range)  Vitamin B Complex TABS 1 tablet (has no administration in time range)  cholecalciferol (VITAMIN D) tablet 5,000 Units (has no administration in time range)  omega-3 acid ethyl esters (LOVAZA) capsule 1 g (has no administration in time range)  fluticasone furoate-vilanterol (BREO ELLIPTA) 100-25 MCG/INH  1 puff (has no administration in time range)  guaiFENesin-dextromethorphan (ROBITUSSIN DM) 100-10 MG/5ML syrup 5 mL (has no administration in time range)  montelukast (SINGULAIR) tablet 10 mg (has no administration in time range)  albuterol (PROVENTIL) (2.5 MG/3ML) 0.083% nebulizer solution 2.5 mg (has no administration in time range)  polyethylene glycol 0.4% and propylene glycol 0.3% (SYSTANE) ophthalmic gel (has no administration in time range)  sodium chloride flush (NS) 0.9 % injection 3 mL (has no administration in time range)  sodium chloride flush (NS) 0.9 % injection 3 mL (has no administration in time range)  0.9 %  sodium chloride infusion (has no administration in time range)  ondansetron (ZOFRAN) injection 4 mg (has no administration in time range)  heparin injection 5,000 Units (has no administration in time range)  insulin aspart (novoLOG) injection 0-9 Units (has  no administration in time range)  insulin aspart (novoLOG) injection 0-5 Units (has no administration in time range)  furosemide (LASIX) injection 40 mg (40 mg Intravenous Given 12/05/19 1449)    ED Course  I have reviewed the triage vital signs and the nursing notes.  Pertinent labs & imaging results that were available during my care of the patient were reviewed by me and considered in my medical decision making (see chart for details).    MDM Rules/Calculators/A&P                      Patient CT scan personally reviewed and shows the large cyst but no acute findings.  She does appear to have CHF.  While she is not in distress, just moving from the bed to the bedside commode because her O2 sats to go into the low 80s according to the nurse.  She was very short of breath.  Given this, I think she will need more diuresis and I have ordered IV Lasix.  Discussed with Dr. Roosevelt Locks for admission.  Troponins minimally elevated and likely from CHF rather than acute cardiac ischemia. Final Clinical Impression(s) / ED Diagnoses Final diagnoses:  Acute systolic congestive heart failure [facility])    Rx / DC Orders ED Discharge Orders    None       [place]wood Gambler, MD 12/05/19 [phone]

## 2019-12-05 NOTE — Progress Notes (Addendum)
Patient with 12 beat run of VT.  Patient has been asymptomatic. APP notified.

## 2019-12-05 NOTE — Progress Notes (Signed)
  Chronic Care Management   Outreach Note  12/05/2019 Name: Brittany Roberts MRN: [id number] DOB: Jan 31, 1941  Referred by: [name], [name], MD Reason for referral : No chief complaint on file.   Third unsuccessful telephone outreach was attempted today. The patient was referred to the pharmacist for assistance with care management and care coordination.   This note is not being shared with the patient for the following reason: To respect privacy (The patient or proxy has requested that the information not be shared).   Follow Up Plan:   [place]

## 2019-12-05 NOTE — ED Notes (Signed)
RN assisted pt to bedside commode, pt's O2 sat decreased to 83% on 2L O2 via [place]. RN increased O2 to 5L via [place], upon returning to bed pt was tachypnic, labored respirations, w/ O2 sat 92%. After a few minutes pt O2 sat increased to 100% on 5L, RN decreased to 3L.

## 2019-12-06 ENCOUNTER — Observation Stay (HOSPITAL_COMMUNITY): Payer: Medicare Other

## 2019-12-06 ENCOUNTER — Telehealth: Payer: Self-pay

## 2019-12-06 ENCOUNTER — Other Ambulatory Visit: Payer: Self-pay | Admitting: Family Medicine

## 2019-12-06 DIAGNOSIS — Z66 Do not resuscitate: Secondary | ICD-10-CM

## 2019-12-06 DIAGNOSIS — Z825 Family history of asthma and other chronic lower respiratory diseases: Secondary | ICD-10-CM | POA: Diagnosis not present

## 2019-12-06 DIAGNOSIS — Z789 Other specified health status: Secondary | ICD-10-CM | POA: Diagnosis not present

## 2019-12-06 DIAGNOSIS — F0390 Unspecified dementia without behavioral disturbance: Secondary | ICD-10-CM | POA: Diagnosis present

## 2019-12-06 DIAGNOSIS — I5033 Acute on chronic diastolic (congestive) heart failure: Secondary | ICD-10-CM | POA: Diagnosis not present

## 2019-12-06 DIAGNOSIS — F329 Major depressive disorder, single episode, unspecified: Secondary | ICD-10-CM

## 2019-12-06 DIAGNOSIS — R5381 Other malaise: Secondary | ICD-10-CM | POA: Diagnosis not present

## 2019-12-06 DIAGNOSIS — E039 Hypothyroidism, unspecified: Secondary | ICD-10-CM | POA: Diagnosis present

## 2019-12-06 DIAGNOSIS — Z8249 Family history of ischemic heart disease and other diseases of the circulatory system: Secondary | ICD-10-CM | POA: Diagnosis not present

## 2019-12-06 DIAGNOSIS — I5023 Acute on chronic systolic (congestive) heart failure: Secondary | ICD-10-CM | POA: Diagnosis not present

## 2019-12-06 DIAGNOSIS — I472 Ventricular tachycardia: Secondary | ICD-10-CM | POA: Diagnosis not present

## 2019-12-06 DIAGNOSIS — N184 Chronic kidney disease, stage 4 (severe): Secondary | ICD-10-CM | POA: Diagnosis present

## 2019-12-06 DIAGNOSIS — I10 Essential (primary) hypertension: Secondary | ICD-10-CM | POA: Diagnosis not present

## 2019-12-06 DIAGNOSIS — Z8261 Family history of arthritis: Secondary | ICD-10-CM | POA: Diagnosis not present

## 2019-12-06 DIAGNOSIS — I5043 Acute on chronic combined systolic (congestive) and diastolic (congestive) heart failure: Secondary | ICD-10-CM | POA: Diagnosis not present

## 2019-12-06 DIAGNOSIS — Z6841 Body Mass Index (BMI) 40.0 and over, adult: Secondary | ICD-10-CM | POA: Diagnosis not present

## 2019-12-06 DIAGNOSIS — E1165 Type 2 diabetes mellitus with hyperglycemia: Secondary | ICD-10-CM

## 2019-12-06 DIAGNOSIS — I130 Hypertensive heart and chronic kidney disease with heart failure and stage 1 through stage 4 chronic kidney disease, or unspecified chronic kidney disease: Secondary | ICD-10-CM | POA: Diagnosis not present

## 2019-12-06 DIAGNOSIS — Z8616 Personal history of COVID-19: Secondary | ICD-10-CM | POA: Diagnosis not present

## 2019-12-06 DIAGNOSIS — M797 Fibromyalgia: Secondary | ICD-10-CM | POA: Diagnosis present

## 2019-12-06 DIAGNOSIS — Z833 Family history of diabetes mellitus: Secondary | ICD-10-CM | POA: Diagnosis not present

## 2019-12-06 DIAGNOSIS — J45909 Unspecified asthma, uncomplicated: Secondary | ICD-10-CM | POA: Diagnosis not present

## 2019-12-06 DIAGNOSIS — F419 Anxiety disorder, unspecified: Secondary | ICD-10-CM

## 2019-12-06 DIAGNOSIS — D573 Sickle-cell trait: Secondary | ICD-10-CM | POA: Diagnosis present

## 2019-12-06 DIAGNOSIS — I5032 Chronic diastolic (congestive) heart failure: Secondary | ICD-10-CM | POA: Diagnosis not present

## 2019-12-06 DIAGNOSIS — D509 Iron deficiency anemia, unspecified: Secondary | ICD-10-CM | POA: Diagnosis present

## 2019-12-06 DIAGNOSIS — I428 Other cardiomyopathies: Secondary | ICD-10-CM | POA: Diagnosis present

## 2019-12-06 DIAGNOSIS — I513 Intracardiac thrombosis, not elsewhere classified: Secondary | ICD-10-CM | POA: Diagnosis not present

## 2019-12-06 DIAGNOSIS — I2510 Atherosclerotic heart disease of native coronary artery without angina pectoris: Secondary | ICD-10-CM | POA: Diagnosis present

## 2019-12-06 DIAGNOSIS — E1122 Type 2 diabetes mellitus with diabetic chronic kidney disease: Secondary | ICD-10-CM | POA: Diagnosis present

## 2019-12-06 DIAGNOSIS — J9621 Acute and chronic respiratory failure with hypoxia: Secondary | ICD-10-CM | POA: Diagnosis not present

## 2019-12-06 DIAGNOSIS — Z832 Family history of diseases of the blood and blood-forming organs and certain disorders involving the immune mechanism: Secondary | ICD-10-CM | POA: Diagnosis not present

## 2019-12-06 LAB — GLUCOSE, CAPILLARY
Glucose-Capillary: 103 mg/dL — ABNORMAL HIGH (ref 70–99)
Glucose-Capillary: 192 mg/dL — ABNORMAL HIGH (ref 70–99)
Glucose-Capillary: 187 mg/dL — ABNORMAL HIGH (ref 70–99)
Glucose-Capillary: 142 mg/dL — ABNORMAL HIGH (ref 70–99)

## 2019-12-06 LAB — ECHOCARDIOGRAM COMPLETE: Weight: 4627.2 oz

## 2019-12-06 LAB — BASIC METABOLIC PANEL
Anion gap: 7 (ref 5–15)
BUN: 24 mg/dL — ABNORMAL HIGH (ref 8–23)
CO2: 26 mmol/L (ref 22–32)
Calcium: 8.4 mg/dL — ABNORMAL LOW (ref 8.9–10.3)
Chloride: 107 mmol/L (ref 98–111)
Creatinine, Ser: 1.77 mg/dL — ABNORMAL HIGH (ref 0.44–1.00)
GFR calc Af Amer: 31 mL/min — ABNORMAL LOW (ref >60)
GFR calc non Af Amer: 27 mL/min — ABNORMAL LOW (ref >60)
Glucose, Bld: 91 mg/dL (ref 70–99)
Potassium: 3.9 mmol/L (ref 3.5–5.1)
Sodium: 140 mmol/L (ref 135–145)

## 2019-12-06 MED ORDER — HEPARIN (PORCINE) 25000 UT/250ML-% IV SOLN
1350.0000 [IU]/h | INTRAVENOUS | Status: DC
  Filled 2019-12-06: qty 250

## 2019-12-06 MED ORDER — FUROSEMIDE 10 MG/ML IJ SOLN
40.0000 mg | Freq: Every day | INTRAMUSCULAR | Status: DC
  Administered 2019-12-06 – 2019-12-07 (×2): 40 mg via INTRAVENOUS
  Filled 2019-12-06 (×2): qty 4

## 2019-12-06 MED ORDER — APIXABAN 5 MG PO TABS
10.0000 mg | ORAL_TABLET | ORAL | Status: AC
  Administered 2019-12-06: 10 mg via ORAL
  Filled 2019-12-06: qty 2

## 2019-12-06 MED ORDER — PERFLUTREN LIPID MICROSPHERE
1.0000 mL | INTRAVENOUS | Status: AC | PRN
  Administered 2019-12-06: 5 mL via INTRAVENOUS
  Filled 2019-12-06: qty 10

## 2019-12-06 MED ORDER — APIXABAN 5 MG PO TABS: 5.0000 mg | ORAL_TABLET | Freq: Two times a day (BID) | ORAL | Status: DC

## 2019-12-06 MED ORDER — APIXABAN 5 MG PO TABS
10.0000 mg | ORAL_TABLET | Freq: Two times a day (BID) | ORAL | Status: DC
  Administered 2019-12-06 – 2019-12-07 (×2): 10 mg via ORAL
  Filled 2019-12-06 (×2): qty 2

## 2019-12-06 MED ORDER — B COMPLEX-C PO TABS
1.0000 | ORAL_TABLET | Freq: Every day | ORAL | Status: DC
  Administered 2019-12-06 – 2019-12-07 (×2): 1 via ORAL
  Filled 2019-12-06 (×2): qty 1

## 2019-12-06 MED ORDER — HEPARIN BOLUS VIA INFUSION
4000.0000 [IU] | Freq: Once | INTRAVENOUS | Status: DC
  Filled 2019-12-06: qty 4000

## 2019-12-06 NOTE — Care Management Obs Status (Signed)
MEDICARE OBSERVATION STATUS NOTIFICATION   Patient Details  Name: Brittany Roberts MRN: [id number] Date of Birth: [birth year]   Medicare Observation Status Notification Given:  Yes    Zenon Mayo, RN 12/06/2019, 4:21 PM

## 2019-12-06 NOTE — Progress Notes (Signed)
PROGRESS NOTE  Brittany Roberts WUJ:[id number] DOB: [birth year]   PCP: [place], [name], MD   Patient is from: Home  DOA: 12/05/2019 LOS: 0  Brief Narrative / Interim history: 79 year old female with history of combined CHF, CAD, DM-2, CKD-4, bipolar disorder, anxiety, depression, fibromyalgia, dementia and hypothyroidism presenting with progressive shortness of breath, edema and admitted for CHF exacerbation. Started on IV Lasix and admitted.  The next day, echo with improved EF (50-55), G1 DD, possible stress-induced CM versus apical LAD infarct and LV apical thrombus.  Cardiology consulted.  Initially started on IV heparin and transition to Eliquis.   Currently on IV Lasix.  Could be transitioned to torsemide as recommended by cardiology on discharge, likely tomorrow.    Subjective: Seen and examined earlier this morning.  Reports some improvement in her breathing.  She denies chest pain,, GI or UTI symptoms.  Objective: Vitals:   12/06/19 0849 12/06/19 0857 12/06/19 1223 12/06/19 1238  BP:  101/76 109/73   Pulse: 63 70 75   Resp:   18   Temp:   (!) 100.6 F (38.1 C)   TempSrc:   Oral   SpO2: 99%  (!) 89% 98%  Weight:        Intake/Output Summary (Last 24 hours) at 12/06/2019 1431 Last data filed at 12/06/2019 1338 Gross per 24 hour  Intake 1200 ml  Output 2325 ml  Net -1125 ml   Filed Weights   12/06/19 0505  Weight: 131.2 kg    Examination:  GENERAL: No apparent distress.  Nontoxic. HEENT: MMM.  Vision and hearing grossly intact.  NECK: Supple.  No apparent JVD.  RESP:  No IWOB.  Fair aeration bilaterally.  Bibasilar crackles CVS:  RRR. Heart sounds normal.  ABD/GI/GU: BS+. Abd soft, NTND.  MSK/EXT:  Moves extremities. No apparent deformity.  Trace edema SKIN: no apparent skin lesion or wound NEURO: Awake, alert and oriented appropriately.  No apparent focal neuro deficit. PSYCH: Calm. Normal affect.  Procedures:  None  Microbiology summarized: COVID-19 PCR  negative.  Assessment & Plan: Acute on chronic combined CHF with recovered EF: echo with improved EF (50-55), G1 DD, possible stress-induced CM versus apical LAD infarct and LV apical thrombus.  Good response to IV Lasix, 1.3 L UOP/24 hours.  Renal function at baseline. -Cardiology consulted and appreciate guidance. -Continue IV Lasix 40 mg daily.  Can be transitioned to torsemide on discharge as recommended by card. -Monitor fluid status, renal function and electrolytes  LV thrombus: -On Eliquis starter pack off label per cardiology  Abdominal pain: Likely related to abdominal wall swelling from fluid overload, CT abdomen reassuring.  Resolved. -Management as above  NSVT: Reportedly had 12 runs of VT overnight but not symptomatic. Abnormal EKG with prolonged QTc and PVCs: QTc read as 537 but difficult to read due to poor quality EKG -Optimize electrolytes -Repeat EKG in the morning.  CKD-4: Baseline Cr 1.6-1.8> 2.0 on 5/18> 1.58 (admit)> 1.77.  -Continue monitoring while on diuretics.  DM-2 with hyperglycemia: On NPH 30 units a.m. and 10 units p.m. at home. Recent Labs    12/06/19 0612 12/06/19 1225 12/06/19 1608  GLUCAP 103* 192* 187*  -Check hemoglobin A1c -Continue SSI-thin and statin  Debility: Worsened by CHF decompensation,  -CHF as above -Home health PT/OT  Hypothyroidism -Continue Synthroid  Dementia without behavioral disturbance: Stable Anxiety/depression/fibromyalgia/schizophrenia: Stable -Reorientation and delirium precautions -Continue home medications         DVT prophylaxis: On Eliquis for anticoagulation Code Status: DNR/DNI  Family Communication: Updated patient's daughter over the phone, and patient's granddaughter at the bedside Status is: Observation  The patient will require care spanning > 2 midnights and should be moved to inpatient because: IV treatments appropriate due to intensity of illness or inability to take PO and Inpatient level  of care appropriate due to severity of illness  Dispo: The patient is from: Home              Anticipated d/c is to: Home              Anticipated d/c date is: 1 day              Patient currently is not medically stable to d/c.       Consultants:  Cardiology   Sch Meds:  Scheduled Meds: . allopurinol  100 mg Oral Daily  . amLODipine  5 mg Oral Daily  . [START ON 12/07/2019] apixaban  10 mg Oral BID   Followed by  . [START ON 12/13/2019] apixaban  5 mg Oral BID  . vitamin C  250 mg Oral Daily  . atorvastatin  20 mg Oral Daily  . B-complex with vitamin C  1 tablet Oral Daily  . cholecalciferol  5,000 Units Oral Daily  . DULoxetine  60 mg Oral Daily  . ferrous sulfate  325 mg Oral Q breakfast  . fluticasone furoate-vilanterol  1 puff Inhalation Daily  . furosemide  40 mg Intravenous Daily  . gabapentin  100 mg Oral BID  . insulin aspart  0-5 Units Subcutaneous QHS  . insulin aspart  0-9 Units Subcutaneous TID WC  . isosorbide mononitrate  30 mg Oral Daily  . levETIRAcetam  500 mg Oral BID  . memantine  10 mg Oral BID  . metoprolol succinate  50 mg Oral Daily  . montelukast  10 mg Oral QHS  . omega-3 acid ethyl esters  1 Roberts Oral Daily  . pantoprazole  40 mg Oral Daily  . sodium chloride flush  3 mL Intravenous Q12H   Continuous Infusions: . sodium chloride     PRN Meds:.sodium chloride, acetaminophen, albuterol, guaiFENesin-dextromethorphan, linaclotide, ondansetron (ZOFRAN) IV, Polyethyl Glycol-Propyl Glycol, sodium chloride flush  Antimicrobials: Anti-infectives (From admission, onward)   None       I have personally reviewed the following labs and images: CBC: Recent Labs  Lab 12/05/19 1139  WBC 10.8*  NEUTROABS 7.6  HGB 10.3*  HCT 31.0*  MCV 75.4*  PLT 228   BMP &GFR Recent Labs  Lab 12/05/19 1139 12/06/19 0315  NA 141 140  K 3.8 3.9  CL 107 107  CO2 24 26  GLUCOSE 151* 91  BUN 23 24*  CREATININE 1.58* 1.77*  CALCIUM 8.7* 8.4*   Estimated  Creatinine Clearance: 33.9 mL/min (A) (by C-Roberts formula based on SCr of 1.77 mg/dL (H)). Liver & Pancreas: Recent Labs  Lab 12/05/19 1139  AST 27  ALT 18  ALKPHOS 100  BILITOT 0.7  PROT 6.5  ALBUMIN 2.5*   Recent Labs  Lab 12/05/19 1139  LIPASE 30   No results for input(s): AMMONIA in the last 168 hours. Diabetic: No results for input(s): HGBA1C in the last 72 hours. Recent Labs  Lab 12/05/19 1634 12/05/19 2110 12/06/19 0612 12/06/19 1225  GLUCAP 101* 144* 103* 192*   Cardiac Enzymes: No results for input(s): CKTOTAL, CKMB, CKMBINDEX, TROPONINI in the last 168 hours. No results for input(s): PROBNP in the last 8760 hours. Coagulation Profile: No results for  input(s): INR, PROTIME in the last 168 hours. Thyroid Function Tests: No results for input(s): TSH, T4TOTAL, FREET4, T3FREE, THYROIDAB in the last 72 hours. Lipid Profile: No results for input(s): CHOL, HDL, LDLCALC, TRIG, CHOLHDL, LDLDIRECT in the last 72 hours. Anemia Panel: No results for input(s): VITAMINB12, FOLATE, FERRITIN, TIBC, IRON, RETICCTPCT in the last 72 hours. Urine analysis:    Component Value Date/Time   COLORURINE YELLOW 12/05/2019 1406   APPEARANCEUR CLEAR 12/05/2019 1406   LABSPEC 1.008 12/05/2019 1406   PHURINE 5.0 12/05/2019 1406   GLUCOSEU NEGATIVE 12/05/2019 1406   HGBUR NEGATIVE 12/05/2019 1406   HGBUR negative 02/26/2009 1314   BILIRUBINUR NEGATIVE 12/05/2019 1406   BILIRUBINUR NEG 04/05/2014 1455   KETONESUR NEGATIVE 12/05/2019 1406   PROTEINUR NEGATIVE 12/05/2019 1406   UROBILINOGEN 0.2 08/01/2014 2305   NITRITE NEGATIVE 12/05/2019 1406   LEUKOCYTESUR NEGATIVE 12/05/2019 1406   Sepsis Labs: Invalid input(s): PROCALCITONIN, LACTICIDVEN  Microbiology: No results found for this or any previous visit (from the past 240 hour(s)).  Radiology Studies: ECHOCARDIOGRAM COMPLETE  Result Date: 12/06/2019    ECHOCARDIOGRAM REPORT   Patient Name:   Brittany Roberts Date of Exam: 12/06/2019  Medical Rec #:  [id number]   Height:       63.5 in Accession #:    [id number]  Weight:       289.2 lb Date of Birth:  1940-11-21   BSA:          2.275 m Patient Age:    79 years    BP:           101/76 mmHg Patient Gender: F           HR:           70 bpm. Exam Location:  Inpatient Procedure: 2D Echo, Cardiac Doppler, Color Doppler and Intracardiac            Opacification Agent REPORT CONTAINS CRITICAL RESULT Indications:    CHF-Acute Diastolic  History:        Patient has prior history of Echocardiogram examinations, most                 recent 12/14/2017. CHF, COPD, Signs/Symptoms:Shortness of Breath;                 Risk Factors:Diabetes, Dyslipidemia, Hypertension, Sleep Apnea                 and Former Smoker. CKD.  Sonographer:    Ross Ludwig RDCS (AE) Referring Phys: [id number] [facility]  Sonographer Comments: Patient is morbidly obese. Image acquisition challenging due to patient body habitus. IMPRESSIONS  1. LV apical akinesis. LVEF 50-55%. Grade 1 diastolic dysfunction. LV apical thrombus. Suspect stress induced cardiomyopathy vs apical LAD infarct.  2. Normal RV size and function.  3. Mild LA dilatation.  4. Mild aortic stenosis. Mean PG 8 mmHg, Vmax 2.0 m/sec, AVA 1.7 cm2 by continuity equation.  5. No other significant valvular abnormalities. Normal right atrial pressure.  6. Compared to previous study in 2019, LV apical findings, mild AS new. FINDINGS  Left Ventricle: LV apical thrombus. Suspect stress induced cardiomyopathy vs apical LAD infarct. Left ventricular ejection fraction, by estimation, is 50 to 55%. The left ventricle has low normal function. The left ventricle demonstrates regional wall motion abnormalities. Definity contrast agent was given IV to delineate the left ventricular endocardial borders. The left ventricular internal cavity size was normal in size. There is no left ventricular hypertrophy. Left ventricular  diastolic parameters are consistent with Grade I diastolic dysfunction  (impaired relaxation).  LV Wall Scoring: The apex is akinetic. Right Ventricle: The right ventricular size is normal. No increase in right ventricular wall thickness. Right ventricular systolic function is normal. Left Atrium: Left atrial size was mildly dilated. Right Atrium: Right atrial size was normal in size. Pericardium: Trivial pericardial effusion is present. Mitral Valve: The mitral valve is grossly normal. No evidence of mitral valve regurgitation. MV peak gradient, 9.2 mmHg. The mean mitral valve gradient is 3.0 mmHg. Tricuspid Valve: The tricuspid valve is grossly normal. Tricuspid valve regurgitation is not demonstrated. Aortic Valve: The aortic valve was not well visualized. Aortic valve regurgitation is mild. Aortic valve mean gradient measures 8.0 mmHg. Aortic valve peak gradient measures 16.2 mmHg. Aortic valve area, by VTI measures 1.71 cm. Pulmonic Valve: The pulmonic valve was grossly normal. Pulmonic valve regurgitation is not visualized. Aorta: The aortic root is normal in size and structure. Venous: The inferior vena cava is normal in size with greater than 50% respiratory variability, suggesting right atrial pressure of 3 mmHg. IAS/Shunts: The interatrial septum was not assessed.  LEFT VENTRICLE PLAX 2D LVIDd:         4.30 cm  Diastology LVIDs:         3.21 cm  LV e' lateral:   5.68 cm/s LV PW:         1.99 cm  LV E/e' lateral: 23.1 LV IVS:        1.97 cm  LV e' medial:    5.80 cm/s LVOT diam:     2.00 cm  LV E/e' medial:  22.6 LV SV:         80 LV SV Index:   35 LVOT Area:     3.14 cm  RIGHT VENTRICLE             IVC RV Basal diam:  2.79 cm     IVC diam: 1.94 cm RV S prime:     10.10 cm/s TAPSE (M-mode): 2.5 cm LEFT ATRIUM             Index       RIGHT ATRIUM           Index LA diam:        3.70 cm 1.63 cm/m  RA Area:     14.30 cm LA Vol (A2C):   53.0 ml 23.30 ml/m RA Volume:   34.90 ml  15.34 ml/m LA Vol (A4C):   77.2 ml 33.93 ml/m LA Biplane Vol: 66.0 ml 29.01 ml/m  AORTIC VALVE AV  Area (Vmax):    1.61 cm AV Area (Vmean):   1.58 cm AV Area (VTI):     1.71 cm AV Vmax:           201.00 cm/s AV Vmean:          128.000 cm/s AV VTI:            0.467 m AV Peak Grad:      16.2 mmHg AV Mean Grad:      8.0 mmHg LVOT Vmax:         103.00 cm/s LVOT Vmean:        64.300 cm/s LVOT VTI:          0.254 m LVOT/AV VTI ratio: 0.54  AORTA Ao Root diam: 3.10 cm Ao Asc diam:  3.20 cm MITRAL VALVE MV Area (PHT): 3.83 cm     SHUNTS MV Peak grad:  9.2  mmHg     Systemic VTI:  0.25 m MV Mean grad:  3.0 mmHg     Systemic Diam: 2.00 cm MV Vmax:       1.52 m/s MV Vmean:      81.9 cm/s MV Decel Time: 198 msec MV E velocity: 131.00 cm/s MV A velocity: 110.00 cm/s MV E/A ratio:  1.19 Manish Patwardhan MD Electronically signed by Truett Mainland MD Signature Date/Time: 12/06/2019/11:53:32 AM    Final     35 minutes with more than 50% spent in reviewing records, counseling patient/family and coordinating care.   Keenya Matera T. Leiana Rund [facility]  If 7PM-7AM, please contact night-coverage [email or web address] Password [facility] 12/06/2019, 2:31 PM

## 2019-12-06 NOTE — Evaluation (Signed)
Physical Therapy Evaluation Patient Details Name: Brittany Roberts MRN: [id number] DOB: 1941/01/18 Today's Date: 12/06/2019   History of Present Illness  Brittany Roberts is a 79 y.o. female with medical history significant of chronic combined systolic and diastolic congestive heart failure (LVEF 45 to 50% with akinesis in inferior wall, grade 2 diastolic dysfunction, in 2019), bipolar disorder, coronary artery disease, depression, fibromyalgia, dementia, GERD,  hypothyroidism, hypertension, diabetes mellitus, chronic kidney disease stage III, presented to the hospital with increasing shortness of breath.    Clinical Impression  Pt admitted with above diagnosis. Pt was able to ambulate with min guard assist with RW with good overall safety with RW. Should progress well. Did desaturate on 2LO2 and needed 5L to maintain sats >90%.  Pt currently with functional limitations due to the deficits listed below (see PT Problem List). Pt will benefit from skilled PT to increase their independence and safety with mobility to allow discharge to the venue listed below.      Follow Up Recommendations Home health PT;Supervision - Intermittent    Equipment Recommendations  None recommended by PT    Recommendations for Other Services       Precautions / Restrictions Precautions Precautions: Fall Restrictions Weight Bearing Restrictions: No      Mobility  Bed Mobility Overal bed mobility: Independent                Transfers Overall transfer level: Needs assistance Equipment used: Rolling walker (2 wheeled) Transfers: Sit to/from Stand Sit to Stand: Supervision         General transfer comment: No assist needed.   Ambulation/Gait Ambulation/Gait assistance: Min guard Gait Distance (Feet): 30 Feet Assistive device: Rolling walker (2 wheeled) Gait Pattern/deviations: Decreased stride length;Step-through pattern;Trunk flexed;Wide base of support   Gait velocity interpretation: <1.31 ft/sec,  indicative of household ambulator General Gait Details: Pt was able to ambulate with RW to door and back with good safety overall.    Stairs            Wheelchair Mobility    Modified Rankin (Stroke Patients Only)       Balance Overall balance assessment: Needs assistance Sitting-balance support: No upper extremity supported;Feet supported Sitting balance-Leahy Scale: Fair     Standing balance support: Bilateral upper extremity supported;During functional activity Standing balance-Leahy Scale: Poor Standing balance comment: relies on UE support for balance                             Pertinent Vitals/Pain      Home Living                        Prior Function                 Hand Dominance        Extremity/Trunk Assessment   Upper Extremity Assessment Upper Extremity Assessment: Defer to OT evaluation    Lower Extremity Assessment Lower Extremity Assessment: Generalized weakness    Cervical / Trunk Assessment Cervical / Trunk Assessment: Normal  Communication      Cognition     Overall Cognitive Status: Within Functional Limits for tasks assessed                                        General Comments General comments (skin integrity, edema, etc.):  O2 91% on 2L at rest; 81% O2 with activity; 90% on 5L with activity    Exercises General Exercises - Lower Extremity Ankle Circles/Pumps: AROM;Both;10 reps;Seated Long Arc Quad: AROM;Both;10 reps;Seated   Assessment/Plan    PT Assessment Patient needs continued PT services  PT Problem List Decreased activity tolerance;Decreased balance;Decreased mobility;Decreased knowledge of use of DME;Decreased safety awareness;Decreased knowledge of precautions;Cardiopulmonary status limiting activity       PT Treatment Interventions DME instruction;Gait training;Functional mobility training;Therapeutic exercise;Therapeutic activities;Balance training;Patient/family  education    PT Goals (Current goals can be found in the Care Plan section)  Acute Rehab PT Goals Patient Stated Goal: to go home PT Goal Formulation: With patient Time For Goal Achievement: 12/20/19 Potential to Achieve Goals: Good    Frequency Min 3X/week   Barriers to discharge        Co-evaluation               AM-PAC PT "6 Clicks" Mobility  Outcome Measure Help needed turning from your back to your side while in a flat bed without using bedrails?: None Help needed moving from lying on your back to sitting on the side of a flat bed without using bedrails?: None Help needed moving to and from a bed to a chair (including a wheelchair)?: A Little Help needed standing up from a chair using your arms (e.g., wheelchair or bedside chair)?: A Little Help needed to walk in hospital room?: A Little Help needed climbing 3-5 steps with a railing? : A Little 6 Click Score: 20    End of Session Equipment Utilized During Treatment: Gait belt;Oxygen Activity Tolerance: Patient limited by fatigue Patient left: in bed;with call bell/phone within reach Nurse Communication: Mobility status PT Visit Diagnosis: Muscle weakness (generalized) (M62.81)    Time: 0946-1000 PT Time Calculation (min) (ACUTE ONLY): 14 min   Charges:   PT Evaluation $PT Eval Moderate Complexity: 1 Mod          Odeal Welden W,PT Acute Rehabilitation Services Pager:  [phone]  Office:  [phone]    Berline Lopes 12/06/2019, 2:01 PM

## 2019-12-06 NOTE — Progress Notes (Addendum)
   12/06/19 1240  Intake (mL)  P.O. 240 mL  Percent Meals Eaten (%) 100 %  Output (mL)  Urine 400 mL     12/06/19 1031  Output (mL)  Urine 325 mL

## 2019-12-06 NOTE — Consult Note (Signed)
CARDIOLOGY CONSULT NOTE  Patient ID: Brittany Roberts MRN: [id number] DOB/AGE: [birth year] 80 y.o.  Admit date: 12/05/2019 Referring Physician: [facility] Reason for Consultation:  LV apical thrombus  HPI:   79 year-old African-American female with nonischemic cardiomyopathy, CAD (PCI RCA), obesity, hypertension, type II diabetes mellitus, morbid obesity, peripheral neuropathy, lupus.  Patient was last seen by me in 10/2019, and was doing well at that tim. She was admitted to [facility] on 12/05/2019 with chest pressure, shortness of breath, leg swelling. Workup showed pulmonary congestion, elevated BNP. Today, sh was also found to have LV apical thrombus.  She is undergoing appropriate IV diuresis with improvement in her symptoms at this time.  Patient was recently hospitalized in May 2021 with Covid pneumonia.  At that time, she had expressed wishes to be DNR.  She still wants to continue the status.    Past Medical History:  Diagnosis Date  . Acute delirium 01/14/2017  . Anxiety   . Arthritis    "all over"  . Asthma   . Bipolar disorder ([place])   . Complication of anesthesia    "w/right foot OR they gave me too much and they couldn't get me woke"  . Congestive heart failure, unspecified   . Coronary artery calcification seen on CAT scan 01/14/2017  . Coronary artery disease    "I've got 1 stent" (06/30/2015)  . Depression   . Discoid lupus   . Fibromyalgia    "I've been told I have this; can't take Lyrica cause I'm allergic to it" (06/30/2015)  . GERD (gastroesophageal reflux disease)   . History of gout   . History of hiatal hernia    "real bad" (06/30/2015)  . Hypertension   . Hypothyroidism   . Memory loss   . On home oxygen therapy    "2L q hs" (06/30/2015)  . OSA (obstructive sleep apnea)    "just use my oxygen; no mask" (06/30/2015)  . Other and unspecified hyperlipidemia   . Penetrating foot wound    left nonhealing foot wound on the dorsal surface  .  Peripheral neuropathy   . Pneumonia "many of times"  . Refusal of blood transfusions as patient is Jehovah's Witness   . Renal failure, unspecified    "kidneys not working 100%" (06/30/2015)  . Seizure ([place])   . Sickle cell trait ([place])   . Systemic lupus ([place])   . Thoracic aortic atherosclerosis ([place]) 01/14/2017  . Type II diabetes mellitus ([place])   . Unspecified vitamin D deficiency      Past Surgical History:  Procedure Laterality Date  . ANKLE ARTHROSCOPY WITH OPEN REDUCTION INTERNAL FIXATION (ORIF) Right 03/2011  . CARDIAC CATHETERIZATION N/A 07/01/2015   Procedure: Left Heart Cath and Coronary Angiography;  Surgeon: Adrian Prows, MD;  Location: [place] [facility];  Service: Cardiovascular;  Laterality: N/A;  . CARDIAC CATHETERIZATION N/A 07/01/2015   Procedure: Intravascular Pressure Wire/FFR Study;  Surgeon: Adrian Prows, MD;  Location: [place] [facility];  Service: Cardiovascular;  Laterality: N/A;  . CARPAL TUNNEL RELEASE Bilateral   . CATARACT EXTRACTION W/ INTRAOCULAR LENS  IMPLANT, BILATERAL Bilateral   . CHOLECYSTECTOMY OPEN    . COLONOSCOPY N/A 12/10/2013   Procedure: COLONOSCOPY;  Surgeon: Inda Castle, MD;  Location: [facility];  Service: Endoscopy;  Laterality: N/A;  . FRACTURE SURGERY    . INCISION AND DRAINAGE ABSCESS Left    foot "under my little toe"  . KNEE LIGAMENT RECONSTRUCTION Right   . LEFT  of breath EXAM: CHEST - 2 VIEW COMPARISON:  11/05/2019 FINDINGS: Stable cardiomegaly. Atherosclerotic calcification of the aortic knob. Pulmonary vascular congestion with prominent interstitial markings bilaterally. Small bilateral pleural effusions. No pneumothorax. IMPRESSION: Cardiomegaly with pulmonary vascular congestion and prominent interstitial markings bilaterally, concerning for CHF with mild edema. Electronically Signed   By: Davina Poke D.O.   On: 12/05/2019 13:36   ECHOCARDIOGRAM COMPLETE  Result Date: 12/06/2019    ECHOCARDIOGRAM REPORT   Patient Name:   Brittany Roberts Date of Exam: 12/06/2019 Medical Rec #:  [id number]   Height:       63.5 in Accession #:    [id number]  Weight:       289.2 lb Date of Birth:  [birth year]   BSA:          2.275 m Patient Age:    79 years    BP:           101/76 mmHg Patient Gender: F           HR:           70 bpm. Exam Location:  Inpatient Procedure: 2D Echo, Cardiac Doppler, Color Doppler and Intracardiac            Opacification Agent REPORT CONTAINS CRITICAL RESULT Indications:    CHF-Acute Diastolic  History:        Patient has prior history of Echocardiogram examinations, most                 recent 12/14/2017. CHF, COPD, Signs/Symptoms:Shortness of Breath;                 Risk Factors:Diabetes, Dyslipidemia, Hypertension, Sleep Apnea                 and Former Smoker. CKD.  Sonographer:    Clayton Lefort RDCS (AE) Referring  Phys: [id number] Lequita Halt  Sonographer Comments: Patient is morbidly obese. Image acquisition challenging due to patient body habitus. IMPRESSIONS  1. LV apical akinesis. LVEF 50-55%. Grade 1 diastolic dysfunction. LV apical thrombus. Suspect stress induced cardiomyopathy vs apical LAD infarct.  2. Normal RV size and function.  3. Mild LA dilatation.  4. Mild aortic stenosis. Mean PG 8 mmHg, Vmax 2.0 m/sec, AVA 1.7 cm2 by continuity equation.  5. No other significant valvular abnormalities. Normal right atrial pressure.  6. Compared to previous study in 2019, LV apical findings, mild AS new. FINDINGS  Left Ventricle: LV apical thrombus. Suspect stress induced cardiomyopathy vs apical LAD infarct. Left ventricular ejection fraction, by estimation, is 50 to 55%. The left ventricle has low normal function. The left ventricle demonstrates regional wall motion abnormalities. Definity contrast agent was given IV to delineate the left ventricular endocardial borders. The left ventricular internal cavity size was normal in size. There is no left ventricular hypertrophy. Left ventricular diastolic parameters are consistent with Grade I diastolic dysfunction (impaired relaxation).  LV Wall Scoring: The apex is akinetic. Right Ventricle: The right ventricular size is normal. No increase in right ventricular wall thickness. Right ventricular systolic function is normal. Left Atrium: Left atrial size was mildly dilated. Right Atrium: Right atrial size was normal in size. Pericardium: Trivial pericardial effusion is present. Mitral Valve: The mitral valve is grossly normal. No evidence of mitral valve regurgitation. MV peak gradient, 9.2 mmHg. The mean mitral valve gradient is 3.0 mmHg. Tricuspid Valve: The tricuspid valve is grossly normal. Tricuspid valve regurgitation is not demonstrated. Aortic Valve: The aortic valve was not well  of breath EXAM: CHEST - 2 VIEW COMPARISON:  11/05/2019 FINDINGS: Stable cardiomegaly. Atherosclerotic calcification of the aortic knob. Pulmonary vascular congestion with prominent interstitial markings bilaterally. Small bilateral pleural effusions. No pneumothorax. IMPRESSION: Cardiomegaly with pulmonary vascular congestion and prominent interstitial markings bilaterally, concerning for CHF with mild edema. Electronically Signed   By: Davina Poke D.O.   On: 12/05/2019 13:36   ECHOCARDIOGRAM COMPLETE  Result Date: 12/06/2019    ECHOCARDIOGRAM REPORT   Patient Name:   Brittany Roberts Date of Exam: 12/06/2019 Medical Rec #:  [id number]   Height:       63.5 in Accession #:    [id number]  Weight:       289.2 lb Date of Birth:  [birth year]   BSA:          2.275 m Patient Age:    79 years    BP:           101/76 mmHg Patient Gender: F           HR:           70 bpm. Exam Location:  Inpatient Procedure: 2D Echo, Cardiac Doppler, Color Doppler and Intracardiac            Opacification Agent REPORT CONTAINS CRITICAL RESULT Indications:    CHF-Acute Diastolic  History:        Patient has prior history of Echocardiogram examinations, most                 recent 12/14/2017. CHF, COPD, Signs/Symptoms:Shortness of Breath;                 Risk Factors:Diabetes, Dyslipidemia, Hypertension, Sleep Apnea                 and Former Smoker. CKD.  Sonographer:    Clayton Lefort RDCS (AE) Referring  Phys: [id number] Lequita Halt  Sonographer Comments: Patient is morbidly obese. Image acquisition challenging due to patient body habitus. IMPRESSIONS  1. LV apical akinesis. LVEF 50-55%. Grade 1 diastolic dysfunction. LV apical thrombus. Suspect stress induced cardiomyopathy vs apical LAD infarct.  2. Normal RV size and function.  3. Mild LA dilatation.  4. Mild aortic stenosis. Mean PG 8 mmHg, Vmax 2.0 m/sec, AVA 1.7 cm2 by continuity equation.  5. No other significant valvular abnormalities. Normal right atrial pressure.  6. Compared to previous study in 2019, LV apical findings, mild AS new. FINDINGS  Left Ventricle: LV apical thrombus. Suspect stress induced cardiomyopathy vs apical LAD infarct. Left ventricular ejection fraction, by estimation, is 50 to 55%. The left ventricle has low normal function. The left ventricle demonstrates regional wall motion abnormalities. Definity contrast agent was given IV to delineate the left ventricular endocardial borders. The left ventricular internal cavity size was normal in size. There is no left ventricular hypertrophy. Left ventricular diastolic parameters are consistent with Grade I diastolic dysfunction (impaired relaxation).  LV Wall Scoring: The apex is akinetic. Right Ventricle: The right ventricular size is normal. No increase in right ventricular wall thickness. Right ventricular systolic function is normal. Left Atrium: Left atrial size was mildly dilated. Right Atrium: Right atrial size was normal in size. Pericardium: Trivial pericardial effusion is present. Mitral Valve: The mitral valve is grossly normal. No evidence of mitral valve regurgitation. MV peak gradient, 9.2 mmHg. The mean mitral valve gradient is 3.0 mmHg. Tricuspid Valve: The tricuspid valve is grossly normal. Tricuspid valve regurgitation is not demonstrated. Aortic Valve: The aortic valve was not well  CARDIOLOGY CONSULT NOTE  Patient ID: Brittany Roberts MRN: [id number] DOB/AGE: [birth year] 79 y.o.  Admit date: 12/05/2019 Referring Physician: [facility] Reason for Consultation:  LV apical thrombus  HPI:   79 year-old African-American female with nonischemic cardiomyopathy, CAD (PCI RCA), obesity, hypertension, type II diabetes mellitus, morbid obesity, peripheral neuropathy, lupus.  Patient was last seen by me in 10/2019, and was doing well at that tim. She was admitted to [facility] on 12/05/2019 with chest pressure, shortness of breath, leg swelling. Workup showed pulmonary congestion, elevated BNP. Today, sh was also found to have LV apical thrombus.  She is undergoing appropriate IV diuresis with improvement in her symptoms at this time.  Patient was recently hospitalized in May 2021 with Covid pneumonia.  At that time, she had expressed wishes to be DNR.  She still wants to continue the status.    Past Medical History:  Diagnosis Date  . Acute delirium 01/14/2017  . Anxiety   . Arthritis    "all over"  . Asthma   . Bipolar disorder ([place])   . Complication of anesthesia    "w/right foot OR they gave me too much and they couldn't get me woke"  . Congestive heart failure, unspecified   . Coronary artery calcification seen on CAT scan 01/14/2017  . Coronary artery disease    "I've got 1 stent" (06/30/2015)  . Depression   . Discoid lupus   . Fibromyalgia    "I've been told I have this; can't take Lyrica cause I'm allergic to it" (06/30/2015)  . GERD (gastroesophageal reflux disease)   . History of gout   . History of hiatal hernia    "real bad" (06/30/2015)  . Hypertension   . Hypothyroidism   . Memory loss   . On home oxygen therapy    "2L q hs" (06/30/2015)  . OSA (obstructive sleep apnea)    "just use my oxygen; no mask" (06/30/2015)  . Other and unspecified hyperlipidemia   . Penetrating foot wound    left nonhealing foot wound on the dorsal surface  .  Peripheral neuropathy   . Pneumonia "many of times"  . Refusal of blood transfusions as patient is Jehovah's Witness   . Renal failure, unspecified    "kidneys not working 100%" (06/30/2015)  . Seizure ([place])   . Sickle cell trait ([place])   . Systemic lupus ([place])   . Thoracic aortic atherosclerosis ([place]) 01/14/2017  . Type II diabetes mellitus ([place])   . Unspecified vitamin D deficiency      Past Surgical History:  Procedure Laterality Date  . ANKLE ARTHROSCOPY WITH OPEN REDUCTION INTERNAL FIXATION (ORIF) Right 03/2011  . CARDIAC CATHETERIZATION N/A 07/01/2015   Procedure: Left Heart Cath and Coronary Angiography;  Surgeon: Adrian Prows, MD;  Location: [facility];  Service: Cardiovascular;  Laterality: N/A;  . CARDIAC CATHETERIZATION N/A 07/01/2015   Procedure: Intravascular Pressure Wire/FFR Study;  Surgeon: Adrian Prows, MD;  Location: [place] [facility];  Service: Cardiovascular;  Laterality: N/A;  . CARPAL TUNNEL RELEASE Bilateral   . CATARACT EXTRACTION W/ INTRAOCULAR LENS  IMPLANT, BILATERAL Bilateral   . CHOLECYSTECTOMY OPEN    . COLONOSCOPY N/A 12/10/2013   Procedure: COLONOSCOPY;  Surgeon: Inda Castle, MD;  Location: [facility];  Service: Endoscopy;  Laterality: N/A;  . FRACTURE SURGERY    . INCISION AND DRAINAGE ABSCESS Left    foot "under my little toe"  . KNEE LIGAMENT RECONSTRUCTION Right   . LEFT  CARDIOLOGY CONSULT NOTE  Patient ID: Brittany Roberts MRN: [id number] DOB/AGE: [birth year] 79 y.o.  Admit date: 12/05/2019 Referring Physician: [facility] Reason for Consultation:  LV apical thrombus  HPI:   79 year-old African-American female with nonischemic cardiomyopathy, CAD (PCI RCA), obesity, hypertension, type II diabetes mellitus, morbid obesity, peripheral neuropathy, lupus.  Patient was last seen by me in 10/2019, and was doing well at that tim. She was admitted to [facility] on 12/05/2019 with chest pressure, shortness of breath, leg swelling. Workup showed pulmonary congestion, elevated BNP. Today, sh was also found to have LV apical thrombus.  She is undergoing appropriate IV diuresis with improvement in her symptoms at this time.  Patient was recently hospitalized in May 2021 with Covid pneumonia.  At that time, she had expressed wishes to be DNR.  She still wants to continue the status.    Past Medical History:  Diagnosis Date  . Acute delirium 01/14/2017  . Anxiety   . Arthritis    "all over"  . Asthma   . Bipolar disorder ([place])   . Complication of anesthesia    "w/right foot OR they gave me too much and they couldn't get me woke"  . Congestive heart failure, unspecified   . Coronary artery calcification seen on CAT scan 01/14/2017  . Coronary artery disease    "I've got 1 stent" (06/30/2015)  . Depression   . Discoid lupus   . Fibromyalgia    "I've been told I have this; can't take Lyrica cause I'm allergic to it" (06/30/2015)  . GERD (gastroesophageal reflux disease)   . History of gout   . History of hiatal hernia    "real bad" (06/30/2015)  . Hypertension   . Hypothyroidism   . Memory loss   . On home oxygen therapy    "2L q hs" (06/30/2015)  . OSA (obstructive sleep apnea)    "just use my oxygen; no mask" (06/30/2015)  . Other and unspecified hyperlipidemia   . Penetrating foot wound    left nonhealing foot wound on the dorsal surface  .  Peripheral neuropathy   . Pneumonia "many of times"  . Refusal of blood transfusions as patient is Jehovah's Witness   . Renal failure, unspecified    "kidneys not working 100%" (06/30/2015)  . Seizure ([place])   . Sickle cell trait ([place])   . Systemic lupus ([place])   . Thoracic aortic atherosclerosis ([place]) 01/14/2017  . Type II diabetes mellitus ([place])   . Unspecified vitamin D deficiency      Past Surgical History:  Procedure Laterality Date  . ANKLE ARTHROSCOPY WITH OPEN REDUCTION INTERNAL FIXATION (ORIF) Right 03/2011  . CARDIAC CATHETERIZATION N/A 07/01/2015   Procedure: Left Heart Cath and Coronary Angiography;  Surgeon: Adrian Prows, MD;  Location: [facility];  Service: Cardiovascular;  Laterality: N/A;  . CARDIAC CATHETERIZATION N/A 07/01/2015   Procedure: Intravascular Pressure Wire/FFR Study;  Surgeon: Adrian Prows, MD;  Location: [place] [facility];  Service: Cardiovascular;  Laterality: N/A;  . CARPAL TUNNEL RELEASE Bilateral   . CATARACT EXTRACTION W/ INTRAOCULAR LENS  IMPLANT, BILATERAL Bilateral   . CHOLECYSTECTOMY OPEN    . COLONOSCOPY N/A 12/10/2013   Procedure: COLONOSCOPY;  Surgeon: Inda Castle, MD;  Location: [facility];  Service: Endoscopy;  Laterality: N/A;  . FRACTURE SURGERY    . INCISION AND DRAINAGE ABSCESS Left    foot "under my little toe"  . KNEE LIGAMENT RECONSTRUCTION Right   . LEFT  of breath EXAM: CHEST - 2 VIEW COMPARISON:  11/05/2019 FINDINGS: Stable cardiomegaly. Atherosclerotic calcification of the aortic knob. Pulmonary vascular congestion with prominent interstitial markings bilaterally. Small bilateral pleural effusions. No pneumothorax. IMPRESSION: Cardiomegaly with pulmonary vascular congestion and prominent interstitial markings bilaterally, concerning for CHF with mild edema. Electronically Signed   By: Davina Poke D.O.   On: 12/05/2019 13:36   ECHOCARDIOGRAM COMPLETE  Result Date: 12/06/2019    ECHOCARDIOGRAM REPORT   Patient Name:   Brittany Roberts Date of Exam: 12/06/2019 Medical Rec #:  [id number]   Height:       63.5 in Accession #:    [id number]  Weight:       289.2 lb Date of Birth:  [birth year]   BSA:          2.275 m Patient Age:    79 years    BP:           101/76 mmHg Patient Gender: F           HR:           70 bpm. Exam Location:  Inpatient Procedure: 2D Echo, Cardiac Doppler, Color Doppler and Intracardiac            Opacification Agent REPORT CONTAINS CRITICAL RESULT Indications:    CHF-Acute Diastolic  History:        Patient has prior history of Echocardiogram examinations, most                 recent 12/14/2017. CHF, COPD, Signs/Symptoms:Shortness of Breath;                 Risk Factors:Diabetes, Dyslipidemia, Hypertension, Sleep Apnea                 and Former Smoker. CKD.  Sonographer:    Clayton Lefort RDCS (AE) Referring  Phys: [id number] Lequita Halt  Sonographer Comments: Patient is morbidly obese. Image acquisition challenging due to patient body habitus. IMPRESSIONS  1. LV apical akinesis. LVEF 50-55%. Grade 1 diastolic dysfunction. LV apical thrombus. Suspect stress induced cardiomyopathy vs apical LAD infarct.  2. Normal RV size and function.  3. Mild LA dilatation.  4. Mild aortic stenosis. Mean PG 8 mmHg, Vmax 2.0 m/sec, AVA 1.7 cm2 by continuity equation.  5. No other significant valvular abnormalities. Normal right atrial pressure.  6. Compared to previous study in 2019, LV apical findings, mild AS new. FINDINGS  Left Ventricle: LV apical thrombus. Suspect stress induced cardiomyopathy vs apical LAD infarct. Left ventricular ejection fraction, by estimation, is 50 to 55%. The left ventricle has low normal function. The left ventricle demonstrates regional wall motion abnormalities. Definity contrast agent was given IV to delineate the left ventricular endocardial borders. The left ventricular internal cavity size was normal in size. There is no left ventricular hypertrophy. Left ventricular diastolic parameters are consistent with Grade I diastolic dysfunction (impaired relaxation).  LV Wall Scoring: The apex is akinetic. Right Ventricle: The right ventricular size is normal. No increase in right ventricular wall thickness. Right ventricular systolic function is normal. Left Atrium: Left atrial size was mildly dilated. Right Atrium: Right atrial size was normal in size. Pericardium: Trivial pericardial effusion is present. Mitral Valve: The mitral valve is grossly normal. No evidence of mitral valve regurgitation. MV peak gradient, 9.2 mmHg. The mean mitral valve gradient is 3.0 mmHg. Tricuspid Valve: The tricuspid valve is grossly normal. Tricuspid valve regurgitation is not demonstrated. Aortic Valve: The aortic valve was not well  of breath EXAM: CHEST - 2 VIEW COMPARISON:  11/05/2019 FINDINGS: Stable cardiomegaly. Atherosclerotic calcification of the aortic knob. Pulmonary vascular congestion with prominent interstitial markings bilaterally. Small bilateral pleural effusions. No pneumothorax. IMPRESSION: Cardiomegaly with pulmonary vascular congestion and prominent interstitial markings bilaterally, concerning for CHF with mild edema. Electronically Signed   By: Davina Poke D.O.   On: 12/05/2019 13:36   ECHOCARDIOGRAM COMPLETE  Result Date: 12/06/2019    ECHOCARDIOGRAM REPORT   Patient Name:   Brittany Roberts Date of Exam: 12/06/2019 Medical Rec #:  [id number]   Height:       63.5 in Accession #:    [id number]  Weight:       289.2 lb Date of Birth:  [birth year]   BSA:          2.275 m Patient Age:    79 years    BP:           101/76 mmHg Patient Gender: F           HR:           70 bpm. Exam Location:  Inpatient Procedure: 2D Echo, Cardiac Doppler, Color Doppler and Intracardiac            Opacification Agent REPORT CONTAINS CRITICAL RESULT Indications:    CHF-Acute Diastolic  History:        Patient has prior history of Echocardiogram examinations, most                 recent 12/14/2017. CHF, COPD, Signs/Symptoms:Shortness of Breath;                 Risk Factors:Diabetes, Dyslipidemia, Hypertension, Sleep Apnea                 and Former Smoker. CKD.  Sonographer:    Clayton Lefort RDCS (AE) Referring  Phys: [id number] Lequita Halt  Sonographer Comments: Patient is morbidly obese. Image acquisition challenging due to patient body habitus. IMPRESSIONS  1. LV apical akinesis. LVEF 50-55%. Grade 1 diastolic dysfunction. LV apical thrombus. Suspect stress induced cardiomyopathy vs apical LAD infarct.  2. Normal RV size and function.  3. Mild LA dilatation.  4. Mild aortic stenosis. Mean PG 8 mmHg, Vmax 2.0 m/sec, AVA 1.7 cm2 by continuity equation.  5. No other significant valvular abnormalities. Normal right atrial pressure.  6. Compared to previous study in 2019, LV apical findings, mild AS new. FINDINGS  Left Ventricle: LV apical thrombus. Suspect stress induced cardiomyopathy vs apical LAD infarct. Left ventricular ejection fraction, by estimation, is 50 to 55%. The left ventricle has low normal function. The left ventricle demonstrates regional wall motion abnormalities. Definity contrast agent was given IV to delineate the left ventricular endocardial borders. The left ventricular internal cavity size was normal in size. There is no left ventricular hypertrophy. Left ventricular diastolic parameters are consistent with Grade I diastolic dysfunction (impaired relaxation).  LV Wall Scoring: The apex is akinetic. Right Ventricle: The right ventricular size is normal. No increase in right ventricular wall thickness. Right ventricular systolic function is normal. Left Atrium: Left atrial size was mildly dilated. Right Atrium: Right atrial size was normal in size. Pericardium: Trivial pericardial effusion is present. Mitral Valve: The mitral valve is grossly normal. No evidence of mitral valve regurgitation. MV peak gradient, 9.2 mmHg. The mean mitral valve gradient is 3.0 mmHg. Tricuspid Valve: The tricuspid valve is grossly normal. Tricuspid valve regurgitation is not demonstrated. Aortic Valve: The aortic valve was not well  of breath EXAM: CHEST - 2 VIEW COMPARISON:  11/05/2019 FINDINGS: Stable cardiomegaly. Atherosclerotic calcification of the aortic knob. Pulmonary vascular congestion with prominent interstitial markings bilaterally. Small bilateral pleural effusions. No pneumothorax. IMPRESSION: Cardiomegaly with pulmonary vascular congestion and prominent interstitial markings bilaterally, concerning for CHF with mild edema. Electronically Signed   By: Davina Poke D.O.   On: 12/05/2019 13:36   ECHOCARDIOGRAM COMPLETE  Result Date: 12/06/2019    ECHOCARDIOGRAM REPORT   Patient Name:   Brittany Roberts Date of Exam: 12/06/2019 Medical Rec #:  [id number]   Height:       63.5 in Accession #:    [id number]  Weight:       289.2 lb Date of Birth:  [birth year]   BSA:          2.275 m Patient Age:    79 years    BP:           101/76 mmHg Patient Gender: F           HR:           70 bpm. Exam Location:  Inpatient Procedure: 2D Echo, Cardiac Doppler, Color Doppler and Intracardiac            Opacification Agent REPORT CONTAINS CRITICAL RESULT Indications:    CHF-Acute Diastolic  History:        Patient has prior history of Echocardiogram examinations, most                 recent 12/14/2017. CHF, COPD, Signs/Symptoms:Shortness of Breath;                 Risk Factors:Diabetes, Dyslipidemia, Hypertension, Sleep Apnea                 and Former Smoker. CKD.  Sonographer:    Clayton Lefort RDCS (AE) Referring  Phys: [id number] Lequita Halt  Sonographer Comments: Patient is morbidly obese. Image acquisition challenging due to patient body habitus. IMPRESSIONS  1. LV apical akinesis. LVEF 50-55%. Grade 1 diastolic dysfunction. LV apical thrombus. Suspect stress induced cardiomyopathy vs apical LAD infarct.  2. Normal RV size and function.  3. Mild LA dilatation.  4. Mild aortic stenosis. Mean PG 8 mmHg, Vmax 2.0 m/sec, AVA 1.7 cm2 by continuity equation.  5. No other significant valvular abnormalities. Normal right atrial pressure.  6. Compared to previous study in 2019, LV apical findings, mild AS new. FINDINGS  Left Ventricle: LV apical thrombus. Suspect stress induced cardiomyopathy vs apical LAD infarct. Left ventricular ejection fraction, by estimation, is 50 to 55%. The left ventricle has low normal function. The left ventricle demonstrates regional wall motion abnormalities. Definity contrast agent was given IV to delineate the left ventricular endocardial borders. The left ventricular internal cavity size was normal in size. There is no left ventricular hypertrophy. Left ventricular diastolic parameters are consistent with Grade I diastolic dysfunction (impaired relaxation).  LV Wall Scoring: The apex is akinetic. Right Ventricle: The right ventricular size is normal. No increase in right ventricular wall thickness. Right ventricular systolic function is normal. Left Atrium: Left atrial size was mildly dilated. Right Atrium: Right atrial size was normal in size. Pericardium: Trivial pericardial effusion is present. Mitral Valve: The mitral valve is grossly normal. No evidence of mitral valve regurgitation. MV peak gradient, 9.2 mmHg. The mean mitral valve gradient is 3.0 mmHg. Tricuspid Valve: The tricuspid valve is grossly normal. Tricuspid valve regurgitation is not demonstrated. Aortic Valve: The aortic valve was not well  CARDIOLOGY CONSULT NOTE  Patient ID: Brittany Roberts MRN: [id number] DOB/AGE: [birth year] 80 y.o.  Admit date: 12/05/2019 Referring Physician: [facility] Reason for Consultation:  LV apical thrombus  HPI:   79 year-old African-American female with nonischemic cardiomyopathy, CAD (PCI RCA), obesity, hypertension, type II diabetes mellitus, morbid obesity, peripheral neuropathy, lupus.  Patient was last seen by me in 10/2019, and was doing well at that tim. She was admitted to [facility] on 12/05/2019 with chest pressure, shortness of breath, leg swelling. Workup showed pulmonary congestion, elevated BNP. Today, sh was also found to have LV apical thrombus.  She is undergoing appropriate IV diuresis with improvement in her symptoms at this time.  Patient was recently hospitalized in May 2021 with Covid pneumonia.  At that time, she had expressed wishes to be DNR.  She still wants to continue the status.    Past Medical History:  Diagnosis Date  . Acute delirium 01/14/2017  . Anxiety   . Arthritis    "all over"  . Asthma   . Bipolar disorder ([place])   . Complication of anesthesia    "w/right foot OR they gave me too much and they couldn't get me woke"  . Congestive heart failure, unspecified   . Coronary artery calcification seen on CAT scan 01/14/2017  . Coronary artery disease    "I've got 1 stent" (06/30/2015)  . Depression   . Discoid lupus   . Fibromyalgia    "I've been told I have this; can't take Lyrica cause I'm allergic to it" (06/30/2015)  . GERD (gastroesophageal reflux disease)   . History of gout   . History of hiatal hernia    "real bad" (06/30/2015)  . Hypertension   . Hypothyroidism   . Memory loss   . On home oxygen therapy    "2L q hs" (06/30/2015)  . OSA (obstructive sleep apnea)    "just use my oxygen; no mask" (06/30/2015)  . Other and unspecified hyperlipidemia   . Penetrating foot wound    left nonhealing foot wound on the dorsal surface  .  Peripheral neuropathy   . Pneumonia "many of times"  . Refusal of blood transfusions as patient is Jehovah's Witness   . Renal failure, unspecified    "kidneys not working 100%" (06/30/2015)  . Seizure ([place])   . Sickle cell trait ([place])   . Systemic lupus ([place])   . Thoracic aortic atherosclerosis ([place]) 01/14/2017  . Type II diabetes mellitus ([place])   . Unspecified vitamin D deficiency      Past Surgical History:  Procedure Laterality Date  . ANKLE ARTHROSCOPY WITH OPEN REDUCTION INTERNAL FIXATION (ORIF) Right 03/2011  . CARDIAC CATHETERIZATION N/A 07/01/2015   Procedure: Left Heart Cath and Coronary Angiography;  Surgeon: Adrian Prows, MD;  Location: [place] [facility];  Service: Cardiovascular;  Laterality: N/A;  . CARDIAC CATHETERIZATION N/A 07/01/2015   Procedure: Intravascular Pressure Wire/FFR Study;  Surgeon: Adrian Prows, MD;  Location: [place] [facility];  Service: Cardiovascular;  Laterality: N/A;  . CARPAL TUNNEL RELEASE Bilateral   . CATARACT EXTRACTION W/ INTRAOCULAR LENS  IMPLANT, BILATERAL Bilateral   . CHOLECYSTECTOMY OPEN    . COLONOSCOPY N/A 12/10/2013   Procedure: COLONOSCOPY;  Surgeon: Inda Castle, MD;  Location: [facility];  Service: Endoscopy;  Laterality: N/A;  . FRACTURE SURGERY    . INCISION AND DRAINAGE ABSCESS Left    foot "under my little toe"  . KNEE LIGAMENT RECONSTRUCTION Right   . LEFT

## 2019-12-06 NOTE — Progress Notes (Addendum)
ANTICOAGULATION CONSULT NOTE - Initial Consult  Pharmacy Consult for Eliquis Indication: LV thrombus  Allergies  Allergen Reactions  . Ace Inhibitors Other (See Comments)    Coincided with sig bump in creat. Retried and creat bumped again.   . Isosorb Dinitrate-Hydralazine Other (See Comments)    Sleep all the time.   Marland Kitchen Penicillins Anaphylaxis, Swelling and Rash    Has patient had a PCN reaction causing immediate rash, facial/tongue/throat swelling, SOB or lightheadedness with hypotension: Yes Has patient had a PCN reaction causing severe rash involving mucus membranes or skin necrosis: Yes Has patient had a PCN reaction that required hospitalization: Yes Has patient had a PCN reaction occurring within the last 10 years: No If all of the above answers are "NO", then may proceed with Cephalosporin use.   Marland Kitchen Buspirone Other (See Comments)    pain   . Pregabalin Swelling  . Ropinirole Hydrochloride Swelling  . Amantadines Rash    "Swelling of the tongue"    Patient Measurements: Weight: 131.2 kg (289 lb 3.2 oz) Heparin Dosing Weight: 85.2 kg IBW 52.4 kg  Vital Signs: Temp: 99.2 F (37.3 C) (06/03 0815) Temp Source: Oral (06/03 0815) BP: 101/76 (06/03 0857) Pulse Rate: 70 (06/03 0857)  Labs: Recent Labs    12/05/19 1139 12/05/19 1422 12/06/19 0315  HGB 10.3*  --   --   HCT 31.0*  --   --   PLT 228  --   --   CREATININE 1.58*  --  1.77*  TROPONINIHS 20* 22*  --     Estimated Creatinine Clearance: 33.9 mL/min (A) (by C-G formula based on SCr of 1.77 mg/dL (H)).   Medical History: Past Medical History:  Diagnosis Date  . Acute delirium 01/14/2017  . Anxiety   . Arthritis    "all over"  . Asthma   . Bipolar disorder ([place])   . Complication of anesthesia    "w/right foot OR they gave me too much and they couldn't get me woke"  . Congestive heart failure, unspecified   . Coronary artery calcification seen on CAT scan 01/14/2017  . Coronary artery disease    "I've got 1 stent" (06/30/2015)  . Depression   . Discoid lupus   . Fibromyalgia    "I've been told I have this; can't take Lyrica cause I'm allergic to it" (06/30/2015)  . GERD (gastroesophageal reflux disease)   . History of gout   . History of hiatal hernia    "real bad" (06/30/2015)  . Hypertension   . Hypothyroidism   . Memory loss   . On home oxygen therapy    "2L q hs" (06/30/2015)  . OSA (obstructive sleep apnea)    "just use my oxygen; no mask" (06/30/2015)  . Other and unspecified hyperlipidemia   . Penetrating foot wound    left nonhealing foot wound on the dorsal surface  . Peripheral neuropathy   . Pneumonia "many of times"  . Refusal of blood transfusions as patient is Jehovah's Witness   . Renal failure, unspecified    "kidneys not working 100%" (06/30/2015)  . Seizure ([place])   . Sickle cell trait ([place])   . Systemic lupus ([place])   . Thoracic aortic atherosclerosis ([place]) 01/14/2017  . Type II diabetes mellitus ([place])   . Unspecified vitamin D deficiency     Assessment: CC/HPI: SOB, swelling  PMH: HF, bipolar, CAD, depression, fibromyalgia, dementia, GERD, anxiety, arthritis, asthma, hypothyroidism, hypertension, diabetes mellitus, chronic kidney disease stage III, discoid lupus,  gout, HH, HTN, peripheral neuropathy, CKD3, DM  Anticoag: LV apical thrombus on Echo. Hgb down to 10.3. Start Eliquis treatment dose..  Goal of Therapy:  Heparin level 0.3-0.7 units/ml Monitor platelets by anticoagulation protocol: Yes   Plan:  Start Eliquis 10mg  BID x 7d, then 5mg  BID   Jonathon Castelo S. Alford Highland, PharmD, BCPS Clinical Staff Pharmacist [email or web address] Alford Highland, Mohammedali Bedoy Stillinger 12/06/2019,12:16 PM

## 2019-12-06 NOTE — Telephone Encounter (Signed)
Pt daughter called (anita brown) and wanted to see if you could give her a call and inform her about her mother when you went to visit her today at the hospital. This is her number [phone]. Thanks

## 2019-12-06 NOTE — Progress Notes (Signed)
Pt requested for MD to call daughter [name] [phone]) with updates.

## 2019-12-06 NOTE — Progress Notes (Signed)
  Echocardiogram 2D Echocardiogram has been performed.  Brittany Roberts 12/06/2019, 11:20 AM

## 2019-12-06 NOTE — Progress Notes (Signed)
SATURATION QUALIFICATIONS: (This note is used to comply with regulatory documentation for home oxygen)  Patient Saturations on Room Air at Rest = 85%  Patient Saturations on Room Air while Ambulating = NT as pt desats on RA at rest  Patient Saturations on 5 Liters of oxygen while Ambulating = 90%  Please briefly explain why patient needs home oxygen:Pt needs O2 at rest and with actviity to maintain sats >90%.  Sherin Murdoch W,PT Acute Rehabilitation Services Pager:  [phone]  Office:  [phone]

## 2019-12-06 NOTE — Telephone Encounter (Signed)
I called [name] on 6/3 at 7:05 PM. No answer. Left voicemail. Will try calling again tonight/tomorrow morning.

## 2019-12-06 NOTE — TOC Initial Note (Signed)
Transition of Care [facility]) - Initial/Assessment Note    Patient Details  Name: Brittany Roberts MRN: [id number] Date of Birth: 1941/03/29  [facility] of [facility]) CM/SW Contact:    Zenon Mayo, RN Phone Number: 12/06/2019, 4:40 PM  Clinical Narrative:                 NCM spoke with patient and daughter at bedside, patient offered choice for [facility] services, daughter states they do not have a preference.  NCM made referral to [facility] with Alvis Lemmings, he is able to take referral.  Soc will begin in 3 days , probably Monday.    Expected Discharge Plan: [place] Barriers to Discharge: Continued Medical Work up   Patient Goals and CMS Choice Patient states their goals for this hospitalization and ongoing recovery are:: get better CMS Medicare.gov Compare Post Acute Care list provided to:: Patient Represenative (must comment) Choice offered to / list presented to : Adult Children  Expected Discharge Plan and Services Expected Discharge Plan: [place]   Discharge Planning Services: CM Consult Post Acute Care Choice: [place] arrangements for the past 2 months: Single Family Home                   DME Agency: NA       HH Arranged: RN, Disease Management, PT [place] Agency: [place] Date [facility] Agency Contacted: 12/06/19 Time HH Agency Contacted: [phone] Representative spoke with at [place]: Tommi Rumps  Prior Living Arrangements/Services Living arrangements for the past 2 months: [place] Lives with:: Self Patient language and need for interpreter reviewed:: Yes Do you feel safe going back to the place where you live?: Yes      Need for Family Participation in Patient Care: Yes (Comment) Care giver support system in place?: Yes (comment)   Criminal Activity/Legal Involvement Pertinent to Current Situation/Hospitalization: No - Comment as needed  Activities of Daily Living      Permission Sought/Granted                  Emotional Assessment Appearance:: Appears stated age Attitude/Demeanor/Rapport: Engaged Affect (typically observed): Appropriate Orientation: : Oriented to Self, Oriented to Place, Oriented to  Time, Oriented to Situation Alcohol / Substance Use: Not Applicable Psych Involvement: No (comment)  Admission diagnosis:  CHF (congestive heart failure) ([place]) [I50.9] Patient Active Problem List   Diagnosis Date Noted  . CHF (congestive heart failure) ([place]) 12/05/2019  . Pneumonia due to COVID-19 virus 11/01/2019  . Respiratory failure ([place]) 11/01/2019  . Memory loss 04/05/2019  . Cerebrovascular accident (CVA) ([place]) 04/05/2019  . Seizures ([place]) 04/05/2019  . Obstructive sleep apnea 04/05/2019  . Chronic heart failure with preserved ejection fraction ([place]) 01/17/2019  . BP (high blood pressure) 01/01/2019  . OP (osteoporosis) 01/01/2019  . Abdominal wall pain in right flank 08/13/2018  . Alopecia, scarring 08/11/2018  . Chronic right shoulder pain 03/15/2018  . Bilateral primary osteoarthritis of knee 02/09/2018  . Pain in right ankle and joints of right foot 02/09/2018  . Acute exacerbation of congestive heart failure ([place]) 12/13/2017  . AKI (acute kidney injury) ([place]) 09/01/2017  . Gout, arthropathy 09/01/2017  . Impacted cerumen of both ears 05/13/2017  . Sensorineural hearing loss, bilateral 05/13/2017  . Localization-related symptomatic epilepsy and epileptic syndromes with complex partial seizures, not intractable, without status epilepticus ([place]) 04/25/2017  . Fibromyalgia 04/08/2017  . Allergic rhinitis 03/11/2017  . Cardiomyopathy ([place]) 01/22/2017  .  Hyperlipidemia associated with type 2 diabetes mellitus ([place]) 01/22/2017  . Bipolar disorder ([place]) 01/22/2017  . Seizure ([place])   . Thoracic aortic atherosclerosis ([place]) 01/14/2017  . Coronary artery calcification seen on CAT scan 01/14/2017  . Acute delirium 01/14/2017  . Possible Tonic-clonic seizure disorder ([place])  01/14/2017  . CVA (cerebral vascular accident) ([place]) 01/14/2017  . Altered mental status 01/13/2017  . Mastalgia 04/20/2016  . Bilateral leg edema 02/10/2016  . Difficulty hearing 10/30/2015  . Midline low back pain with right-sided sciatica 09/25/2015  . Carpal tunnel syndrome on left 08/14/2015  . Chest pain of uncertain etiology 06/23/7587  . Primary localized osteoarthrosis, hand 06/19/2015  . Radial styloid tenosynovitis 06/19/2015  . Dysarthria 02/14/2015  . Schizophrenia, unspecified type ([place]) 10/25/2014  . Type 2 diabetes mellitus with complication ([place])   . Cephalalgia   . Hx of systemic lupus erythematosus (SLE) ([place])   . Thyroid mass 04/26/2014  . Constipation 12/10/2013  . Benign neoplasm of colon 12/10/2013  . Type 2 diabetes mellitus with diabetic neuropathy, unspecified ([place]) 11/27/2013  . Cystic kidney disease 11/09/2013  . Insulin dependent type 2 diabetes mellitus ([place]) 10/29/2013  . Hoarseness or changing voice 09/27/2013  . Dyskinesia, tardive 07/11/2013  . TMJ syndrome 02/16/2013  . Angina pectoris associated with type 2 diabetes mellitus ([place]) 08/08/2012  . Coronary artery disease of native artery of native heart with stable angina pectoris ([place]) 08/08/2012  . Asthma, chronic, PRESUMED wiht MULTIFACTORIAL DYSPNEA 05/30/2012  . Insomnia 09/22/2011  . Mobility poor 02/02/2011  . Chronic kidney disease, stage III (moderate) 05/13/2010  . Chronic combined systolic and diastolic CHF, NYHA class 2 ([place]) 03/31/2010  . COPD (chronic obstructive pulmonary disease) ([place]) 03/26/2010  . Morbid obesity ([place]) 02/27/2010  . Unstable gait 02/27/2010  . DM (diabetes mellitus), type 2 with neurological complications ([place]) 32/54/9826  . Arthritis 04/25/2009  . Anemia 03/11/2007  . COGNITIVE IMPAIRMENT, MILD, SO STATED 03/11/2007  . RESTLESS LEG SYNDROME 03/11/2007  . Lupus ([place]) 03/11/2007  . OSTEOARTHROSIS, GENERALIZED, MULTIPLE SITES 03/11/2007  . Hypothyroidism  03/07/2007  . Hypercholesteremia 03/07/2007  . Depression 03/07/2007  . Hypertensive heart disease with heart failure ([place]) 03/07/2007  . Coronary atherosclerosis 03/07/2007  . GERD 03/07/2007  . SLEEP APNEA 03/07/2007   PCP:  [name], [name], MD Pharmacy:   [facility] [place], [place]  [address] Phone: [phone] Fax: [phone]  [place], [place] [address] [place] [address] Phone: [phone] Fax: [phone]  [facility] [place], [place] [address] [place] [address] Phone: [phone] Fax: [phone]  [place], [place] - [address] [place] [address] Phone: [phone] Fax: [phone]  [place], [place] [address] [place] [address] Phone: [phone] Fax: [phone]     Social Determinants of Health (SDOH) Interventions    Readmission Risk Interventions No flowsheet data found.

## 2019-12-07 DIAGNOSIS — I5033 Acute on chronic diastolic (congestive) heart failure: Secondary | ICD-10-CM

## 2019-12-07 DIAGNOSIS — J9621 Acute and chronic respiratory failure with hypoxia: Secondary | ICD-10-CM

## 2019-12-07 LAB — RENAL FUNCTION PANEL
Albumin: 2.4 g/dL — ABNORMAL LOW (ref 3.5–5.0)
Anion gap: 8 (ref 5–15)
BUN: 29 mg/dL — ABNORMAL HIGH (ref 8–23)
CO2: 27 mmol/L (ref 22–32)
Calcium: 8.2 mg/dL — ABNORMAL LOW (ref 8.9–10.3)
Chloride: 103 mmol/L (ref 98–111)
Creatinine, Ser: 1.68 mg/dL — ABNORMAL HIGH (ref 0.44–1.00)
GFR calc Af Amer: 33 mL/min — ABNORMAL LOW (ref >60)
GFR calc non Af Amer: 28 mL/min — ABNORMAL LOW (ref >60)
Glucose, Bld: 141 mg/dL — ABNORMAL HIGH (ref 70–99)
Phosphorus: 3.7 mg/dL (ref 2.5–4.6)
Potassium: 3.8 mmol/L (ref 3.5–5.1)
Sodium: 138 mmol/L (ref 135–145)

## 2019-12-07 LAB — CBC
HCT: 29.1 % — ABNORMAL LOW (ref 36.0–46.0)
Hemoglobin: 9.7 g/dL — ABNORMAL LOW (ref 12.0–15.0)
MCH: 25.3 pg — ABNORMAL LOW (ref 26.0–34.0)
MCHC: 33.3 g/dL (ref 30.0–36.0)
MCV: 75.8 fL — ABNORMAL LOW (ref 80.0–100.0)
Platelets: 212 10*3/uL (ref 150–400)
RBC: 3.84 MIL/uL — ABNORMAL LOW (ref 3.87–5.11)
RDW: 14.4 % (ref 11.5–15.5)
WBC: 10.2 10*3/uL (ref 4.0–10.5)
nRBC: 0.2 % (ref 0.0–0.2)

## 2019-12-07 LAB — GLUCOSE, CAPILLARY
Glucose-Capillary: 152 mg/dL — ABNORMAL HIGH (ref 70–99)
Glucose-Capillary: 195 mg/dL — ABNORMAL HIGH (ref 70–99)
Glucose-Capillary: 248 mg/dL — ABNORMAL HIGH (ref 70–99)

## 2019-12-07 LAB — MAGNESIUM: Magnesium: 1.2 mg/dL — ABNORMAL LOW (ref 1.7–2.4)

## 2019-12-07 MED ORDER — HUMULIN N KWIKPEN 100 UNIT/ML ~~LOC~~ SUPN: PEN_INJECTOR | SUBCUTANEOUS | Status: DC

## 2019-12-07 MED ORDER — APIXABAN 5 MG PO TABS: 5.0000 mg | ORAL_TABLET | Freq: Two times a day (BID) | ORAL | 0 refills | Status: DC

## 2019-12-07 MED ORDER — LINACLOTIDE 145 MCG PO CAPS: 145.0000 ug | ORAL_CAPSULE | Freq: Every day | ORAL | Status: DC | PRN

## 2019-12-07 MED ORDER — MAGNESIUM SULFATE 2 GM/50ML IV SOLN
2.0000 g | Freq: Once | INTRAVENOUS | Status: AC
  Administered 2019-12-07: 2 g via INTRAVENOUS
  Filled 2019-12-07: qty 50

## 2019-12-07 MED ORDER — TORSEMIDE 20 MG PO TABS: 20.0000 mg | ORAL_TABLET | Freq: Two times a day (BID) | ORAL | 0 refills | Status: DC

## 2019-12-07 MED FILL — TORSEMIDE 20 MG TABLET: 20 | 30 days supply | Qty: 60 | Fill #0

## 2019-12-07 MED FILL — ELIQUIS 5 MG TABLET: 5 | 30 days supply | Qty: 74 | Fill #0

## 2019-12-07 NOTE — Progress Notes (Signed)
  Mobility Specialist Criteria Algorithm Info.  SATURATION QUALIFICATIONS: (This note is used to comply with regulatory documentation for home oxygen)  Patient Saturations on Room Air at Rest = 90%  Patient Saturations on Room Air while Ambulating = 80%  Patient Saturations on 3 Liters of oxygen while Ambulating = 93%  Please briefly explain why patient needs home oxygen: Patient desat's on room air during ambulation requiring the need for supplemental O2  Mobility Team:  [facility] elevated:HOB 30 Activity: Ambulated in hall;Transferred:  Bed to chair (to chair after ambulation) Range of motion: Active;All extremities Level of assistance: Standby assist, set-up cues, supervision of patient - no hands on (Supervision for ambulation; min A to EOB) Assistive device: Front wheel walker Minutes sitting in chair: No data recorded Minutes stood: 5 minutes Minutes ambulated: 5 minutes Distance ambulated (ft): 100 ft Mobility response: Tolerated well;RN notified Bed Position: Chair ([place] chair)    12/07/2019 3:06 PM

## 2019-12-07 NOTE — Discharge Instructions (Signed)
Information on my medicine - ELIQUIS (apixaban)  This medication education was reviewed with me or my healthcare representative as part of my discharge preparation.    Why was Eliquis prescribed for you? Eliquis was prescribed to treat blood clots that may have been found in the veins of your legs (deep vein thrombosis) or in your lungs (pulmonary embolism) and to reduce the risk of them occurring again.  What do You need to know about Eliquis ? The starting dose is 10 mg (two 5 mg tablets) taken TWICE daily for the FIRST SEVEN (7) DAYS, then on 6/10  the dose is reduced to ONE 5 mg tablet taken TWICE daily.  Eliquis may be taken with or without food.   Try to take the dose about the same time in the morning and in the evening. If you have difficulty swallowing the tablet whole please discuss with your pharmacist how to take the medication safely.  Take Eliquis exactly as prescribed and DO NOT stop taking Eliquis without talking to the doctor who prescribed the medication.  Stopping may increase your risk of developing a new blood clot.  Refill your prescription before you run out.  After discharge, you should have regular check-up appointments with your healthcare provider that is prescribing your Eliquis.    What do you do if you miss a dose? If a dose of ELIQUIS is not taken at the scheduled time, take it as soon as possible on the same day and twice-daily administration should be resumed. The dose should not be doubled to make up for a missed dose.  Important Safety Information A possible side effect of Eliquis is bleeding. You should call your healthcare provider right away if you experience any of the following: ? Bleeding from an injury or your nose that does not stop. ? Unusual colored urine (red or dark brown) or unusual colored stools (red or black). ? Unusual bruising for unknown reasons. ? A serious fall or if you hit your head (even if there is no bleeding).  Some  medicines may interact with Eliquis and might increase your risk of bleeding or clotting while on Eliquis. To help avoid this, consult your healthcare provider or pharmacist prior to using any new prescription or non-prescription medications, including herbals, vitamins, non-steroidal anti-inflammatory drugs (NSAIDs) and supplements.  This website has more information on Eliquis (apixaban): http://www.eliquis.com/eliquis/home    Additional discharge instructions  Please get your medications reviewed and adjusted by your Primary MD.  Please request your Primary MD to go over all Hospital Tests and Procedure/Radiological results at the follow up, please get all Hospital records sent to your Prim MD by signing hospital release before you go home.  If you had Pneumonia of Lung problems at the Hospital: Please get a 2 view Chest X ray done in 6-8 weeks after hospital discharge or sooner if instructed by your Primary MD.  If you have Congestive Heart Failure: Please call your Cardiologist or Primary MD anytime you have any of the following symptoms:  1) 3 pound weight gain in 24 hours or 5 pounds in 1 week  2) shortness of breath, with or without a dry hacking cough  3) swelling in the hands, feet or stomach  4) if you have to sleep on extra pillows at night in order to breathe  Follow cardiac low salt diet and 1.5 lit/day fluid restriction.  If you have diabetes Accuchecks 4 times/day, Once in AM empty stomach and then before each meal. Log  in all results and show them to your primary doctor at your next visit. If any glucose reading is under 80 or above 300 call your primary MD immediately.  If you have Seizure/Convulsions/Epilepsy: Please do not drive, operate heavy machinery, participate in activities at heights or participate in high speed sports until you have seen by Primary MD or a Neurologist and advised to do so again.  If you had Gastrointestinal Bleeding: Please ask your  Primary MD to check a complete blood count within one week of discharge or at your next visit. Your endoscopic/colonoscopic biopsies that are pending at the time of discharge, will also need to followed by your Primary MD.  Get Medicines reviewed and adjusted. Please take all your medications with you for your next visit with your Primary MD  Please request your Primary MD to go over all hospital tests and procedure/radiological results at the follow up, please ask your Primary MD to get all Hospital records sent to his/her office.  If you experience worsening of your admission symptoms, develop shortness of breath, life threatening emergency, suicidal or homicidal thoughts you must seek medical attention immediately by calling 911 or calling your MD immediately  if symptoms less severe.  You must read complete instructions/literature along with all the possible adverse reactions/side effects for all the Medicines you take and that have been prescribed to you. Take any new Medicines after you have completely understood and accpet all the possible adverse reactions/side effects.   Do not drive or operate heavy machinery when taking Pain medications.   Do not take more than prescribed Pain, Sleep and Anxiety Medications  Special Instructions: If you have smoked or chewed Tobacco  in the last 2 yrs please stop smoking, stop any regular Alcohol  and or any Recreational drug use.  Wear Seat belts while driving.  Please note You were cared for by a hospitalist during your hospital stay. If you have any questions about your discharge medications or the care you received while you were in the hospital after you are discharged, you can call the unit and asked to speak with the hospitalist on call if the hospitalist that took care of you is not available. Once you are discharged, your primary care physician will handle any further medical issues. Please note that NO REFILLS for any discharge medications  will be authorized once you are discharged, as it is imperative that you return to your primary care physician (or establish a relationship with a primary care physician if you do not have one) for your aftercare needs so that they can reassess your need for medications and monitor your lab values.  You can reach the hospitalist office at phone [phone] or fax [phone]   If you do not have a primary care physician, you can call [phone] for a physician referral.

## 2019-12-07 NOTE — TOC Transition Note (Signed)
Transition of Care [facility]) - CM/SW Discharge Note   Patient Details  Name: Brittany Roberts MRN: [id number] Date of Birth: [birth year]  [facility] CM/SW Contact:  Zenon Mayo, RN Phone Number: 12/07/2019, 4:10 PM   Clinical Narrative:    Patient is for dc today, she will need home oxygen, NCM made referral to [facility] with [employer]. NCM notified Ranee Gosselin that patient is for dc.  She has no other needs.   Final next level of care: [place] Barriers to Discharge: No Barriers Identified   Patient Goals and CMS Choice Patient states their goals for this hospitalization and ongoing recovery are:: get better CMS Medicare.gov Compare Post Acute Care list provided to:: Patient Represenative (must comment) Choice offered to / list presented to : Adult Children  Discharge Placement                       Discharge Plan and Services   Discharge Planning Services: CM Consult Post Acute Care Choice: Home Health          DME Arranged: Oxygen DME Agency: [employer] Date DME Agency Contacted: 12/07/19 Time DME Agency Contacted: [id number] Representative spoke with at DME Agency: Thedore Mins HH Arranged: RN, Disease Management, PT [place] Agency: [place] Date [place]: 12/06/19 Time [place]: [place] Representative spoke with at [place]: [place] ([place]) Interventions     Readmission Risk Interventions No flowsheet data found.

## 2019-12-07 NOTE — Consult Note (Signed)
[facility] CM Inpatient Consult   12/07/2019  MEILYN JOSUE 1940/08/24 [id number]   [facility] [[facility]]  Accountable Care Organization [ACO] Patient:  Brittany Roberts   Patient screened for high risk score for unplanned readmission score and for less than 30 days readmission hospitalization to check if potential [facility]  [[facility]] Care Management service needs.  Review of patient's medical record reveals patient had a recent hx with COVID pneumonia admission on 11/01/19 and with a COVID-19 vaccine [10/04/19 in encounter doc] noted as well last month and currently admitted with HF exacerbation.  Primary Care Provider is  Betty [place], MD this provider. [facility] is listed to provide the transition of care [TOC] for post hospital follow up.  Plan:  Continue to follow progress and disposition to assess for post hospital care management needs.  Patient is currently being recommended for home with home health with supervision, daughter is noted as support person.  Please place a [facility] Care Management consult as appropriate and for questions contact:   Charlesetta Shanks, RN BSN CCM [facility]  [phone] business mobile phone Toll free office [phone]  Fax number: [phone] [email or web address]

## 2019-12-07 NOTE — Progress Notes (Signed)
Lauren, NT, reported that she questioned seeing blood in patients stool. This RN went to assess patient and stool. On observation there was no blood noted, no visible hemorrhoids, and no blood noted upon wiping. Dorthey Sawyer, RN

## 2019-12-07 NOTE — Discharge Summary (Addendum)
Physician Discharge Summary  Brittany Roberts JJO:[id number] DOB: Jun 29, 1941  PCP: [name], [name], MD  Admitted from: Home Discharged to: Home  Admit date: 12/05/2019 Discharge date: 12/07/2019  Recommendations for Outpatient Follow-up:   Follow-up Information    Care, [facility] Follow up.   Specialty: Home Health Services Why: [facility], HHPT Contact information: [place] [address] [phone]        [name], [name], MD. Schedule an appointment as soon as possible for a visit in 1 week(s).   Specialty: Family Medicine Why: To be seen with repeat labs (CBC, CMP & magnesium). Contact information: [place] [address] [phone]        Nigel Mormon, MD. Schedule an appointment as soon as possible for a visit in 1 week(s).   Specialties: Cardiology, Radiology Contact information: [place] [address] [phone]            Home Health: PT Equipment/Devices: Oxygen via nasal cannula 3 L/min continuously. PTA she was on oxygen 2 L/min at bedtime and as needed during the daytime.  Discharge Condition: Improved and stable CODE STATUS: DNR Diet recommendation: Heart healthy and diabetic diet.  Discharge Diagnoses:  Active Problems:   CHF (congestive heart failure) (HCC)   Acute on chronic diastolic CHF (congestive heart failure) (HCC)   Brief Summary: 79 year old female, lives alone, ambulates with the help of a Rollator, PMH of chronic combined CHF, CAD, type II DM/IDDM, stage IV CKD, bipolar disorder, anxiety, depression, fibromyalgia, dementia and hypothyroidism presented with progressive dyspnea, edema and was admitted for CHF exacerbation. She was started on IV Lasix. Cardiology was consulted. TTE showed LVEF of 76-28%, grade 1 diastolic dysfunction, possible stress-induced cardiomyopathy versus apical LAD infarct and LV apical thrombus. She was initially on IV  heparin and then cardiology recommended transitioning to Eliquis (off label use).   Assessment and plan:  1. Acute on chronic combined CHF: EF has recovered: TTE showed LVEF 31-51%, grade 1 diastolic dysfunction, possible stress-induced cardiomyopathy versus apical LAD infarct and LV apical thrombus. She responded well to IV Lasix. -645 mL since admit although the intake output appears inaccurate. She put out 1.3 L urine yesterday. Weight also appears unchanged again does not appear acute rate. Creatinine stable. Cardiology consulted. Post diuresis with IV Lasix 40 mg daily, they recommend transitioning to torsemide 20 mg twice daily at discharge. Improved. Outpatient follow-up with cardiology. 2. LV thrombus: As per cardiology recommendations, started on Eliquis (off label use). Warfarin not started given her age and renal functions. Since patient was started on Eliquis, aspirin was discontinued. 3. Acute on chronic respiratory failure with hypoxia: Secondary to decompensated CHF. PTA she was only on bedtime oxygen but now qualifies for daytime oxygen as well. 4. Abdominal pain: May have been related to edema secondary to volume overload. CT abdomen and pelvis without contrast showed minimal bilateral pleural effusions with adjacent subsegmental atelectasis, stable large right renal cysts, calcified uterine fibroids and no acute abnormality seen in the abdomen or pelvis. 5. NSVT: Reportedly had 12 beat run of nonsustained VT early on in admission. Repeat EKG on 6/4 shows poor baseline with artifact, appears to be sinus rhythm with some PVCs, low voltage, first-degree AV block and QTC of 455 ms, Q waves in leads III and aVF, slow R wave progression, Q waves in leads V1-V3 and no acute changes. Continue beta-blockers. 6. Prolonged QTC: Improved. 7. CKD stage IV: Baseline creatinine  1.6-1.8 which increased to 2 on 5/18. 1.58 on admission which has remained stable. As per discussion with daughter, awaiting  referral from PCP to see a nephrologist. Had remotely seen a nephrologist in the past. Follow BMP closely. 8. Type II DM with hyperglycemia: Given chronic kidney disease and small doses of sliding scale insulin required here, risk of hypoglycemia and hence reduced NPH insulin as noted below. Discussed with patient and daughter to maintain logs of CBGs and follow-up with PCP. 9. Debility: Due to advanced age and multiple severe significant comorbidities. Home health services arranged. 10. Hypothyroid: Does not appear to be on Synthroid. Outpatient follow-up. Clinically euthyroid. 11. Dementia without behavioral disturbance: Stable. 12. Anxiety/depression/fibromyalgia/schizophrenia: Stable. 13. Microcytic anemia: Could be anemia of chronic disease versus anemia of chronic kidney disease versus iron deficiency. No overt bleeding reported. Interestingly hemoglobin was in the 12-13 g range earlier this year, presented with hemoglobin of 10.3. Patient denies overt bleeding including red blood per rectum or melena or coffee-ground emesis. Nurse tech question some blood in stools which was verified by nursing and no bleeding noted. Follow CBC closely and consider outpatient evaluation and management as deemed necessary. Now that she has been started on Eliquis, aspirin discontinued and will need to be monitored closely 14. Hypomagnesemia: Magnesium 1.2. Given advanced chronic kidney disease, replaced gentle IV magnesium of 2 g IV x1 dose. Outpatient follow-up.  Consultations:  Cardiology  Procedures:  None   Discharge Instructions  Discharge Instructions    (HEART FAILURE PATIENTS) Call MD:  Anytime you have any of the following symptoms: 1) 3 pound weight gain in 24 hours or 5 pounds in 1 week 2) shortness of breath, with or without a dry hacking cough 3) swelling in the hands, feet or stomach 4) if you have to sleep on extra pillows at night in order to breathe.   Complete by: As directed    Call MD  for:  difficulty breathing, headache or visual disturbances   Complete by: As directed    Call MD for:  extreme fatigue   Complete by: As directed    Call MD for:  persistant dizziness or light-headedness   Complete by: As directed    Call MD for:  persistant nausea and vomiting   Complete by: As directed    Call MD for:  severe uncontrolled pain   Complete by: As directed    Call MD for:  temperature >100.4   Complete by: As directed    Diet - low sodium heart healthy   Complete by: As directed    Diet Carb Modified   Complete by: As directed    Increase activity slowly   Complete by: As directed        Medication List    STOP taking these medications   aspirin EC 81 MG tablet   furosemide 40 MG tablet Commonly known as: LASIX   sodium bicarbonate 650 MG tablet   sodium zirconium cyclosilicate 5 g packet Commonly known as: LOKELMA     TAKE these medications   Accu-Chek Aviva Plus test strip Generic drug: glucose blood Use to test blood sugar three times daily   Accu-Chek Softclix Lancets lancets Use as instructed   acetaminophen 500 MG tablet Commonly known as: TYLENOL Take 500 mg by mouth every 6 (six) hours as needed for moderate pain.   AeroChamber Plus inhaler Use as instructed to use with inahaler.   allopurinol 100 MG tablet Commonly known as: ZYLOPRIM Take 1 tablet (100  mg total) by mouth daily.   amLODipine 5 MG tablet Commonly known as: NORVASC Take 1 tablet (5 mg total) by mouth daily.   apixaban 5 MG Tabs tablet Commonly known as: ELIQUIS Take 1-2 tablets (5-10 mg total) by mouth 2 (two) times daily. Take 2 tablets (10 mg total) twice daily through 12/12/2019 and then from 12/13/2019 take 1 tablet (5 mg total) twice daily.   atorvastatin 20 MG tablet Commonly known as: LIPITOR TAKE 1 TABLET(20 MG) BY MOUTH DAILY What changed: See the new instructions.   B-D ULTRAFINE III SHORT PEN 31G X 8 MM Misc Generic drug: Insulin Pen Needle CHECK  BLOOD SUGAR 3 TIMES A DAY   budesonide-formoterol 80-4.5 MCG/ACT inhaler Commonly known as: SYMBICORT Inhale 2 puffs into the lungs 2 (two) times daily.   DULoxetine 60 MG capsule Commonly known as: Cymbalta Take 1 capsule (60 mg total) by mouth daily.   ferrous sulfate 325 (65 FE) MG tablet Take 325 mg by mouth daily with breakfast.   Fish Oil 1000 MG Caps Take 1,000 mg by mouth daily.   Fluocinolone Acetonide Scalp 0.01 % Oil Apply 1 application topically at bedtime as needed (itching scalp).   gabapentin 100 MG capsule Commonly known as: NEURONTIN Take 1 capsule (100 mg total) by mouth 2 (two) times daily.   guaiFENesin-dextromethorphan 100-10 MG/5ML syrup Commonly known as: ROBITUSSIN DM Take 5 mLs by mouth every 4 (four) hours as needed for cough.   HumuLIN N KwikPen 100 UNIT/ML Kiwkpen Generic drug: Insulin NPH (Human) (Isophane) 10 Units am and 10 Units pm What changed: additional instructions   isosorbide mononitrate 30 MG 24 hr tablet Commonly known as: IMDUR Take 1 tablet (30 mg total) by mouth daily.   levETIRAcetam 500 MG tablet Commonly known as: KEPPRA Take 1 tablet (500 mg total) by mouth 2 (two) times daily.   linaclotide 145 MCG Caps capsule Commonly known as: LINZESS Take 1 capsule (145 mcg total) by mouth daily as needed (abdominal cramping).   memantine 10 MG tablet Commonly known as: Namenda Take 1 tablet (10 mg total) by mouth 2 (two) times daily.   metoprolol succinate 50 MG 24 hr tablet Commonly known as: TOPROL-XL Take 1 tablet (50 mg total) by mouth daily. Take with or immediately following a meal.   montelukast 10 MG tablet Commonly known as: SINGULAIR Take 1 tablet (10 mg total) by mouth at bedtime.   pantoprazole 40 MG tablet Commonly known as: PROTONIX TAKE 1 TABLET(40 MG) BY MOUTH DAILY What changed: See the new instructions.   ProAir HFA 108 (90 Base) MCG/ACT inhaler Generic drug: albuterol INHALE 2 PUFF BY MOUTH EVERY 4  HOURS AS NEEDED USE ONLY IF YOU ARE WHEEZING What changed:   how much to take  how to take this  when to take this  reasons to take this  additional instructions   Systane 0.4-0.3 % Gel ophthalmic gel Generic drug: Polyethyl Glycol-Propyl Glycol Place 1 application into both eyes every 4 (four) hours as needed (dry eyes).   torsemide 20 MG tablet Commonly known as: Demadex Take 1 tablet (20 mg total) by mouth 2 (two) times daily.   Vitamin B Complex Tabs Take 1 tablet by mouth daily.   VITAMIN C PO Take 1 tablet by mouth daily.   Vitamin D3 125 MCG (5000 UT) Caps Take 5,000 Units by mouth daily.      Allergies  Allergen Reactions   Ace Inhibitors Other (See Comments)    Coincided with  sig bump in creat. Retried and creat bumped again.    Isosorb Dinitrate-Hydralazine Other (See Comments)    Sleep all the time.    Penicillins Anaphylaxis, Swelling and Rash    Has patient had a PCN reaction causing immediate rash, facial/tongue/throat swelling, SOB or lightheadedness with hypotension: Yes Has patient had a PCN reaction causing severe rash involving mucus membranes or skin necrosis: Yes Has patient had a PCN reaction that required hospitalization: Yes Has patient had a PCN reaction occurring within the last 10 years: No If all of the above answers are "NO", then may proceed with Cephalosporin use.    Buspirone Other (See Comments)    pain    Pregabalin Swelling   Ropinirole Hydrochloride Swelling   Amantadines Rash    "Swelling of the tongue"      Procedures/Studies: CT ABDOMEN PELVIS WO CONTRAST  Result Date: 12/05/2019 CLINICAL DATA:  Right lower quadrant abdominal pain. EXAM: CT ABDOMEN AND PELVIS WITHOUT CONTRAST TECHNIQUE: Multidetector CT imaging of the abdomen and pelvis was performed following the standard protocol without IV contrast. COMPARISON:  October 17, 2013. FINDINGS: Lower chest: Minimal bilateral pleural effusions are noted with adjacent  subsegmental atelectasis. Hepatobiliary: No focal liver abnormality is seen. Status post cholecystectomy. No biliary dilatation. Pancreas: Unremarkable. No pancreatic ductal dilatation or surrounding inflammatory changes. Spleen: Chronic peripheral calcifications are noted and unchanged consistent with old hematoma or infarction. Adrenals/Urinary Tract: Adrenal glands appear normal. Stable large right renal cysts are noted. No hydronephrosis or renal obstruction is noted. No renal or ureteral calculi are noted. Urinary bladder is unremarkable. Stomach/Bowel: Stomach is within normal limits. Appendix appears normal. No evidence of bowel wall thickening, distention, or inflammatory changes. Vascular/Lymphatic: Aortic atherosclerosis. No enlarged abdominal or pelvic lymph nodes. Reproductive: Calcified uterine fibroids are again noted. No adnexal abnormality is noted. Other: No abdominal wall hernia or abnormality. No abdominopelvic ascites. Musculoskeletal: No acute or significant osseous findings. IMPRESSION: 1. Minimal bilateral pleural effusions are noted with adjacent subsegmental atelectasis. 2. Stable large right renal cysts. 3. Calcified uterine fibroids. 4. No acute abnormality seen in the abdomen or pelvis. Aortic Atherosclerosis (ICD10-I70.0). Electronically Signed   By: Marijo Conception M.D.   On: 12/05/2019 13:45   DG Chest 2 View  Result Date: 12/05/2019 CLINICAL DATA:  Leg swelling, shortness of breath EXAM: CHEST - 2 VIEW COMPARISON:  11/05/2019 FINDINGS: Stable cardiomegaly. Atherosclerotic calcification of the aortic knob. Pulmonary vascular congestion with prominent interstitial markings bilaterally. Small bilateral pleural effusions. No pneumothorax. IMPRESSION: Cardiomegaly with pulmonary vascular congestion and prominent interstitial markings bilaterally, concerning for CHF with mild edema. Electronically Signed   By: Davina Poke D.O.   On: 12/05/2019 13:36   ECHOCARDIOGRAM  COMPLETE  Result Date: 12/06/2019    ECHOCARDIOGRAM REPORT   Patient Name:   Brittany Roberts Date of Exam: 12/06/2019 Medical Rec #:  [id number]   Height:       63.5 in Accession #:    [id number]  Weight:       289.2 lb Date of Birth:  July 13, 1940   BSA:          2.275 m Patient Age:    47 years    BP:           101/76 mmHg Patient Gender: F           HR:           70 bpm. Exam Location:  Inpatient Procedure: 2D Echo, Cardiac Doppler, Color Doppler and Intracardiac  Opacification Agent REPORT CONTAINS CRITICAL RESULT Indications:    CHF-Acute Diastolic  History:        Patient has prior history of Echocardiogram examinations, most                 recent 12/14/2017. CHF, COPD, Signs/Symptoms:Shortness of Breath;                 Risk Factors:Diabetes, Dyslipidemia, Hypertension, Sleep Apnea                 and Former Smoker. CKD.  Sonographer:    Clayton Lefort RDCS (AE) Referring Phys: [id number] Lequita Halt  Sonographer Comments: Patient is morbidly obese. Image acquisition challenging due to patient body habitus. IMPRESSIONS  1. LV apical akinesis. LVEF 50-55%. Grade 1 diastolic dysfunction. LV apical thrombus. Suspect stress induced cardiomyopathy vs apical LAD infarct.  2. Normal RV size and function.  3. Mild LA dilatation.  4. Mild aortic stenosis. Mean PG 8 mmHg, Vmax 2.0 m/sec, AVA 1.7 cm2 by continuity equation.  5. No other significant valvular abnormalities. Normal right atrial pressure.  6. Compared to previous study in 2019, LV apical findings, mild AS new. FINDINGS  Left Ventricle: LV apical thrombus. Suspect stress induced cardiomyopathy vs apical LAD infarct. Left ventricular ejection fraction, by estimation, is 50 to 55%. The left ventricle has low normal function. The left ventricle demonstrates regional wall motion abnormalities. Definity contrast agent was given IV to delineate the left ventricular endocardial borders. The left ventricular internal cavity size was normal in size. There is no left  ventricular hypertrophy. Left ventricular diastolic parameters are consistent with Grade I diastolic dysfunction (impaired relaxation).  LV Wall Scoring: The apex is akinetic. Right Ventricle: The right ventricular size is normal. No increase in right ventricular wall thickness. Right ventricular systolic function is normal. Left Atrium: Left atrial size was mildly dilated. Right Atrium: Right atrial size was normal in size. Pericardium: Trivial pericardial effusion is present. Mitral Valve: The mitral valve is grossly normal. No evidence of mitral valve regurgitation. MV peak gradient, 9.2 mmHg. The mean mitral valve gradient is 3.0 mmHg. Tricuspid Valve: The tricuspid valve is grossly normal. Tricuspid valve regurgitation is not demonstrated. Aortic Valve: The aortic valve was not well visualized. Aortic valve regurgitation is mild. Aortic valve mean gradient measures 8.0 mmHg. Aortic valve peak gradient measures 16.2 mmHg. Aortic valve area, by VTI measures 1.71 cm. Pulmonic Valve: The pulmonic valve was grossly normal. Pulmonic valve regurgitation is not visualized. Aorta: The aortic root is normal in size and structure. Venous: The inferior vena cava is normal in size with greater than 50% respiratory variability, suggesting right atrial pressure of 3 mmHg. IAS/Shunts: The interatrial septum was not assessed.  LEFT VENTRICLE PLAX 2D LVIDd:         4.30 cm  Diastology LVIDs:         3.21 cm  LV e' lateral:   5.68 cm/s LV PW:         1.99 cm  LV E/e' lateral: 23.1 LV IVS:        1.97 cm  LV e' medial:    5.80 cm/s LVOT diam:     2.00 cm  LV E/e' medial:  22.6 LV SV:         80 LV SV Index:   35 LVOT Area:     3.14 cm  RIGHT VENTRICLE             IVC RV Basal diam:  2.79 cm     IVC diam: 1.94 cm RV S prime:     10.10 cm/s TAPSE (M-mode): 2.5 cm LEFT ATRIUM             Index       RIGHT ATRIUM           Index LA diam:        3.70 cm 1.63 cm/m  RA Area:     14.30 cm LA Vol (A2C):   53.0 ml 23.30 ml/m RA Volume:    34.90 ml  15.34 ml/m LA Vol (A4C):   77.2 ml 33.93 ml/m LA Biplane Vol: 66.0 ml 29.01 ml/m  AORTIC VALVE AV Area (Vmax):    1.61 cm AV Area (Vmean):   1.58 cm AV Area (VTI):     1.71 cm AV Vmax:           201.00 cm/s AV Vmean:          128.000 cm/s AV VTI:            0.467 m AV Peak Grad:      16.2 mmHg AV Mean Grad:      8.0 mmHg LVOT Vmax:         103.00 cm/s LVOT Vmean:        64.300 cm/s LVOT VTI:          0.254 m LVOT/AV VTI ratio: 0.54  AORTA Ao Root diam: 3.10 cm Ao Asc diam:  3.20 cm MITRAL VALVE MV Area (PHT): 3.83 cm     SHUNTS MV Peak grad:  9.2 mmHg     Systemic VTI:  0.25 m MV Mean grad:  3.0 mmHg     Systemic Diam: 2.00 cm MV Vmax:       1.52 m/s MV Vmean:      81.9 cm/s MV Decel Time: 198 msec MV E velocity: 131.00 cm/s MV A velocity: 110.00 cm/s MV E/A ratio:  1.19 Manish Patwardhan MD Electronically signed by Vernell Leep MD Signature Date/Time: 12/06/2019/11:53:32 AM    Final       Subjective: Patient denies complaints. States that her breathing is back to baseline. No chest pain. No dizziness or lightheadedness. As per RN, no acute issues noted.  Discharge Exam:  Vitals:   12/07/19 0420 12/07/19 0758 12/07/19 0920 12/07/19 1200  BP: 138/87  (!) 123/59 126/61  Pulse: 67  64   Resp: 18  18 18   Temp: 98.4 F (36.9 C)  98.7 F (37.1 C) 98.7 F (37.1 C)  TempSrc: Oral  Oral Oral  SpO2: 100% 99% 98% 93%  Weight: 130.9 kg       General: Pleasant elderly female, moderately built and obese lying comfortably propped up in bed without distress. Cardiovascular: S1 and S2 heard, RRR. No JVD or murmurs. Trace bilateral ankle edema. Telemetry personally reviewed: Sinus rhythm, low voltage, frequent PVCs. No further NSVT. Respiratory: Clear to auscultation without wheezing, rhonchi or crackles. No increased work of breathing. Abdominal:  Non distended, non tender & soft. No organomegaly or masses appreciated. Normal bowel sounds heard. CNS: Alert and oriented. No focal  deficits. Extremities: No cyanosis.    The results of significant diagnostics from this hospitalization (including imaging, microbiology, ancillary and laboratory) are listed below for reference.     Microbiology: No results found for this or any previous visit (from the past 240 hour(s)).   Labs: CBC: Recent Labs  Lab 12/05/19 1139 12/07/19 0415  WBC 10.8* 10.2  NEUTROABS 7.6  --  HGB 10.3* 9.7*  HCT 31.0* 29.1*  MCV 75.4* 75.8*  PLT 228 957    Basic Metabolic Panel: Recent Labs  Lab 12/05/19 1139 12/06/19 0315 12/07/19 0415  NA 141 140 138  K 3.8 3.9 3.8  CL 107 107 103  CO2 24 26 27   GLUCOSE 151* 91 141*  BUN 23 24* 29*  CREATININE 1.58* 1.77* 1.68*  CALCIUM 8.7* 8.4* 8.2*  MG  --   --  1.2*  PHOS  --   --  3.7    Liver Function Tests: Recent Labs  Lab 12/05/19 1139 12/07/19 0415  AST 27  --   ALT 18  --   ALKPHOS 100  --   BILITOT 0.7  --   PROT 6.5  --   ALBUMIN 2.5* 2.4*    CBG: Recent Labs  Lab 12/06/19 1608 12/06/19 2152 12/07/19 0634 12/07/19 1107 12/07/19 1545  GLUCAP 187* 142* 152* 195* 248*     Urinalysis    Component Value Date/Time   COLORURINE YELLOW 12/05/2019 1406   APPEARANCEUR CLEAR 12/05/2019 1406   LABSPEC 1.008 12/05/2019 1406   PHURINE 5.0 12/05/2019 1406   GLUCOSEU NEGATIVE 12/05/2019 1406   [place] 12/05/2019 1406   HGBUR negative 02/26/2009 1314   BILIRUBINUR NEGATIVE 12/05/2019 1406   BILIRUBINUR NEG 04/05/2014 1455   KETONESUR NEGATIVE 12/05/2019 1406   PROTEINUR NEGATIVE 12/05/2019 1406   UROBILINOGEN 0.2 08/01/2014 2305   NITRITE NEGATIVE 12/05/2019 1406   [place] 12/05/2019 1406    Discussed in detail with patient's daughter, updated care and answered questions.  Time coordinating discharge: 50 minutes  SIGNED:  Vernell Leep, MD, [place], [facility]. [facility]  To contact the attending provider between 7A-7P or the covering provider during after hours 7P-7A, please  log into the web site [email or web address] and access using universal [place] password for that web site. If you do not have the password, please call the hospital operator.

## 2019-12-07 NOTE — Progress Notes (Addendum)
SATURATION QUALIFICATIONS:  Patient Saturations on Room Air at Rest = 93%  Patient Saturations on Room Air while Ambulating = 80%  Patient Saturations on 3 Liters of oxygen while Ambulating = 95%

## 2019-12-07 NOTE — TOC Benefit Eligibility Note (Signed)
Transition of Care [facility]) Benefit Eligibility Note    Patient Details  Name: Brittany Roberts MRN: [id number] Date of Birth: 04-30-1941   Medication/Dose: Arne Cleveland  5 MG BID  Covered?: Yes  Tier: (NO TIER)  Prescription Coverage Preferred Pharmacy: Roseanne Kaufman with Person/Company/Phone Number:: [facility]  @ [employer] # [phone]  Co-Pay: Johnsie Kindred  Prior Approval: No  Deductible: (LOWE INCOME SUBSIDY)  Additional Notes: SECONDARY INS : MEDICAID  [place] ACCESS  CO-PAY- $4.00 FOR EACH PRESCRIPTION    Memory Argue Phone Number: 12/07/2019, 5:06 PM

## 2019-12-10 ENCOUNTER — Other Ambulatory Visit: Payer: Self-pay

## 2019-12-11 ENCOUNTER — Encounter: Payer: Self-pay | Admitting: Family Medicine

## 2019-12-11 ENCOUNTER — Ambulatory Visit (INDEPENDENT_AMBULATORY_CARE_PROVIDER_SITE_OTHER): Payer: Medicare Other | Admitting: Family Medicine

## 2019-12-11 ENCOUNTER — Other Ambulatory Visit (INDEPENDENT_AMBULATORY_CARE_PROVIDER_SITE_OTHER): Payer: Medicare Other

## 2019-12-11 DIAGNOSIS — I5042 Chronic combined systolic (congestive) and diastolic (congestive) heart failure: Secondary | ICD-10-CM | POA: Diagnosis not present

## 2019-12-11 DIAGNOSIS — N281 Cyst of kidney, acquired: Secondary | ICD-10-CM | POA: Diagnosis not present

## 2019-12-11 DIAGNOSIS — J449 Chronic obstructive pulmonary disease, unspecified: Secondary | ICD-10-CM

## 2019-12-11 DIAGNOSIS — I110 Hypertensive heart disease with heart failure: Secondary | ICD-10-CM | POA: Diagnosis not present

## 2019-12-11 DIAGNOSIS — N179 Acute kidney failure, unspecified: Secondary | ICD-10-CM | POA: Diagnosis not present

## 2019-12-11 DIAGNOSIS — N184 Chronic kidney disease, stage 4 (severe): Secondary | ICD-10-CM

## 2019-12-11 DIAGNOSIS — D500 Iron deficiency anemia secondary to blood loss (chronic): Secondary | ICD-10-CM

## 2019-12-11 DIAGNOSIS — I513 Intracardiac thrombosis, not elsewhere classified: Secondary | ICD-10-CM

## 2019-12-11 LAB — BASIC METABOLIC PANEL
BUN: 44 mg/dL — ABNORMAL HIGH (ref 6–23)
CO2: 28 mEq/L (ref 19–32)
Calcium: 9.1 mg/dL (ref 8.4–10.5)
Chloride: 100 mEq/L (ref 96–112)
Creatinine, Ser: 1.9 mg/dL — ABNORMAL HIGH (ref 0.40–1.20)
GFR: 30.77 mL/min — ABNORMAL LOW (ref >60.00)
Glucose, Bld: 255 mg/dL — ABNORMAL HIGH (ref 70–99)
Potassium: 3.8 mEq/L (ref 3.5–5.1)
Sodium: 137 mEq/L (ref 135–145)

## 2019-12-11 LAB — CBC
HCT: 32.2 % — ABNORMAL LOW (ref 36.0–46.0)
Hemoglobin: 10.4 g/dL — ABNORMAL LOW (ref 12.0–15.0)
MCHC: 32.3 g/dL (ref 30.0–36.0)
MCV: 77.6 fl — ABNORMAL LOW (ref 78.0–100.0)
Platelets: 236 10*3/uL (ref 150.0–400.0)
RBC: 4.15 Mil/uL (ref 3.87–5.11)
RDW: 15.4 % (ref 11.5–15.5)
WBC: 15.1 10*3/uL — ABNORMAL HIGH (ref 4.0–10.5)

## 2019-12-11 LAB — HEPATIC FUNCTION PANEL
ALT: 14 U/L (ref 0–35)
AST: 23 U/L (ref 0–37)
Albumin: 3.5 g/dL (ref 3.5–5.2)
Alkaline Phosphatase: 115 U/L (ref 39–117)
Bilirubin, Direct: 0.1 mg/dL (ref 0.0–0.3)
Total Bilirubin: 0.3 mg/dL (ref 0.2–1.2)
Total Protein: 7.6 g/dL (ref 6.0–8.3)

## 2019-12-11 LAB — MAGNESIUM: Magnesium: 1.4 mg/dL — ABNORMAL LOW (ref 1.5–2.5)

## 2019-12-11 NOTE — Assessment & Plan Note (Signed)
Systolic function has improved. Continue Torsemide 20 mg daily and Metoprolol Succinate 50 mg daily. Low salt diet. Follows with cardiologist.

## 2019-12-11 NOTE — Assessment & Plan Note (Addendum)
Renal function has been stable during 2nd hospitalization. Cr fluctuates between 1.5-1.8 and e GFR 29-31. Adequate hydration. Appt with nephrologist pending. [place] recommendations according to BMP result.

## 2019-12-11 NOTE — Assessment & Plan Note (Signed)
It has been stable sine 2015. Reassured. We reviewed findings of abdominal CT done in 2015.

## 2019-12-11 NOTE — Patient Instructions (Addendum)
A few things to remember from today's visit:   No changes today. Appt with nephro will be arranged. Low salt diet very important.  Monitor oxygen with finger device regularly. Keep appt with cardiologist.  You can get your 2nd COVID 19 vaccine.   Renal cyst in right kidney seem to be benign, no further follow up needed at this time.  Please go to [facility] today for labs.  If you need refills please call your pharmacy. Do not use My Chart to request refills or for acute issues that need immediate attention.    Please be sure medication list is accurate. If a new problem present, please set up appointment sooner than planned today.

## 2019-12-11 NOTE — Progress Notes (Signed)
Chief Complaint  Patient presents with  . Hospitalization Follow-up   HPI: Brittany Roberts is a 79 y.o. female, who is here today to follow on recent hospitalization.  She was last seen on 11/20/2019.  She was admitted on 12/05/2019 and discharged on 12/07/2019. She presented to the ER with worsening dyspnea and edema, diagnosed with CHF exacerbation. She was treated with IV Lasix. Cardiology consultation was done while in the hospital.  TTE: LVEF 50 to 79%, grade 1 diastolic dysfunction, possible stress-induced cardiomyopathy vs apical LAD infarct and LV apical thrombus. Treated initially with heparin and transitioned to Eliquis. Aspirin has been discontinued.  During hospitalization she had 12 beat run nonsustained VT. EKG on 12/07/2019 showed SR with some PVCs,and first-degree AV block.  Furosemide stopped. Started on torsemide 20 mg twice daily. Sleeping on 3 pillows, "sometimes",stable for years. Negative for PND.  HTN: She is on Amlodipine 5 mg daily and Metoprolol Succinate 50 mg daily. Home BP's 120-140/70-90's. Negative for severe/frequent headache, visual changes, chest pain,palpitation,or diaphoresis.  Exertional dyspnea when she doe snot have supplemental O2, "sometimes" associated cough and "little" wheezing. Negative for hemoptysis.  Lab Results  Component Value Date   CREATININE 1.68 (H) 12/07/2019   BUN 29 (H) 12/07/2019   NA 138 12/07/2019   K 3.8 12/07/2019   CL 103 12/07/2019   CO2 27 12/07/2019   Anemia of chronic disease: She has not noted blood in stool or melena.  Lab Results  Component Value Date   WBC 10.2 12/07/2019   HGB 9.7 (L) 12/07/2019   HCT 29.1 (L) 12/07/2019   MCV 75.8 (L) 12/07/2019   PLT 212 12/07/2019   Mg low at 1.2. Elevated troponin at 20 and 22. BNP 505.5.  Lab Results  Component Value Date   ALT 18 12/05/2019   AST 27 12/05/2019   ALKPHOS 100 12/05/2019   BILITOT 0.7 12/05/2019    Chronic respiratory failure  with hypoxia with acute exacerbation. She is on continues O2 supplementation at 2 LPM. O2 sats with O2 supplementation: 94-99%. Her daughter has stopped O2 supplementation when she goes to the bathroom due to risk for tripping.She has not checked O2 sats w/o O2.  CKD IV:  She has not received appt with nephrologist,referral placed last visit. She was seeing Dr Florene Glen, who retired. She has not noted gross hematuria,foam in urine,or decreased urine output.  PT has not been started since first hospitalization.  She is concerned about renal cyst seen on abdominal CT imaging, she was not told about this when she had abdominal imaging in the past.  Abdomen/pelvic CT done on 12/05/2019 showed minimal bilateral pleural effusion with adjacent subsegmental atelectasis, a stable large right renal cyst, calcified uterine fibroids, and aortic atherosclerosis.  No acute abnormality seen in the abdominal pelvis.  Review of Systems  Constitutional: Positive for fatigue. Negative for activity change, appetite change and fever.  HENT: Positive for postnasal drip. Negative for mouth sores, nosebleeds and sore throat.   Cardiovascular: Negative for palpitations and leg swelling.  Gastrointestinal: Negative for abdominal pain, nausea and vomiting.       Negative for changes in bowel habits.  Genitourinary: Negative for decreased urine volume, dysuria and hematuria.  Musculoskeletal: Positive for arthralgias, back pain, gait problem and myalgias.  Neurological: Negative for syncope and facial asymmetry.  Psychiatric/Behavioral: Negative for confusion. The patient is nervous/anxious.   Rest see pertinent positives and negatives per HPI.  Current Outpatient Medications on File Prior to Visit  Medication Sig Dispense Refill  . Accu-Chek Softclix Lancets lancets Use as instructed 100 each 3  . acetaminophen (TYLENOL) 500 MG tablet Take 500 mg by mouth every 6 (six) hours as needed for moderate pain.    Marland Kitchen  allopurinol (ZYLOPRIM) 100 MG tablet Take 1 tablet (100 mg total) by mouth daily. 90 tablet 2  . amLODipine (NORVASC) 5 MG tablet Take 1 tablet (5 mg total) by mouth daily. 90 tablet 3  . apixaban (ELIQUIS) 5 MG TABS tablet Take 1-2 tablets (5-10 mg total) by mouth 2 (two) times daily. Take 2 tablets (10 mg total) twice daily through 12/12/2019 and then from 12/13/2019 take 1 tablet (5 mg total) twice daily. 60 tablet 0  . Ascorbic Acid (VITAMIN C PO) Take 1 tablet by mouth daily.    Marland Kitchen atorvastatin (LIPITOR) 20 MG tablet TAKE 1 TABLET(20 MG) BY MOUTH DAILY 90 tablet 0  . B Complex Vitamins (VITAMIN B COMPLEX) TABS Take 1 tablet by mouth daily.    . budesonide-formoterol (SYMBICORT) 80-4.5 MCG/ACT inhaler Inhale 2 puffs into the lungs 2 (two) times daily. 1 Inhaler 3  . Cholecalciferol (VITAMIN D3) 5000 UNITS CAPS Take 5,000 Units by mouth daily.    . DULoxetine (CYMBALTA) 60 MG capsule Take 1 capsule (60 mg total) by mouth daily. 90 capsule 2  . ferrous sulfate 325 (65 FE) MG tablet Take 325 mg by mouth daily with breakfast.    . Fluocinolone Acetonide Scalp 0.01 % OIL Apply 1 application topically at bedtime as needed (itching scalp).     . gabapentin (NEURONTIN) 100 MG capsule Take 1 capsule (100 mg total) by mouth 2 (two) times daily.    Marland Kitchen glucose blood (ACCU-CHEK AVIVA PLUS) test strip Use to test blood sugar three times daily 100 each 3  . guaiFENesin-dextromethorphan (ROBITUSSIN DM) 100-10 MG/5ML syrup Take 5 mLs by mouth every 4 (four) hours as needed for cough. 118 mL 0  . Insulin NPH, Human,, Isophane, (HUMULIN N KWIKPEN) 100 UNIT/ML Kiwkpen 10 Units am and 10 Units pm    . Insulin Pen Needle (B-D ULTRAFINE III SHORT PEN) 31G X 8 MM MISC CHECK BLOOD SUGAR 3 TIMES A DAY 100 each 3  . isosorbide mononitrate (IMDUR) 30 MG 24 hr tablet Take 1 tablet (30 mg total) by mouth daily. 90 tablet 3  . levETIRAcetam (KEPPRA) 500 MG tablet Take 1 tablet (500 mg total) by mouth 2 (two) times daily. 180  tablet 3  . linaclotide (LINZESS) 145 MCG CAPS capsule Take 1 capsule (145 mcg total) by mouth daily as needed (abdominal cramping).    . memantine (NAMENDA) 10 MG tablet Take 1 tablet (10 mg total) by mouth 2 (two) times daily. 60 tablet 11  . metoprolol succinate (TOPROL-XL) 50 MG 24 hr tablet Take 1 tablet (50 mg total) by mouth daily. Take with or immediately following a meal. 90 tablet 1  . montelukast (SINGULAIR) 10 MG tablet Take 1 tablet (10 mg total) by mouth at bedtime. 90 tablet 3  . Omega-3 Fatty Acids (FISH OIL) 1000 MG CAPS Take 1,000 mg by mouth daily.     . pantoprazole (PROTONIX) 40 MG tablet TAKE 1 TABLET(40 MG) BY MOUTH DAILY (Patient taking differently: Take 40 mg by mouth daily. ) 90 tablet 3  . Polyethyl Glycol-Propyl Glycol (SYSTANE) 0.4-0.3 % GEL ophthalmic gel Place 1 application into both eyes every 4 (four) hours as needed (dry eyes).    Marland Kitchen PROAIR HFA 108 (90 Base) MCG/ACT inhaler INHALE  2 PUFF BY MOUTH EVERY 4 HOURS AS NEEDED USE ONLY IF YOU ARE WHEEZING (Patient taking differently: Inhale 2 puffs into the lungs every 4 (four) hours as needed for wheezing. ) 8.5 g 3  . Spacer/Aero-Holding Chambers (AEROCHAMBER PLUS) inhaler Use as instructed to use with inahaler. 1 each 1  . torsemide (DEMADEX) 20 MG tablet Take 1 tablet (20 mg total) by mouth 2 (two) times daily. 60 tablet 0   No current facility-administered medications on file prior to visit.    Past Medical History:  Diagnosis Date  . Acute delirium 01/14/2017  . Anxiety   . Arthritis    "all over"  . Asthma   . Bipolar disorder ([place])   . Complication of anesthesia    "w/right foot OR they gave me too much and they couldn't get me woke"  . Congestive heart failure, unspecified   . Coronary artery calcification seen on CAT scan 01/14/2017  . Coronary artery disease    "I've got 1 stent" (06/30/2015)  . Depression   . Discoid lupus   . Fibromyalgia    "I've been told I have this; can't take Lyrica cause I'm  allergic to it" (06/30/2015)  . GERD (gastroesophageal reflux disease)   . History of gout   . History of hiatal hernia    "real bad" (06/30/2015)  . Hypertension   . Hypothyroidism   . Memory loss   . On home oxygen therapy    "2L q hs" (06/30/2015)  . OSA (obstructive sleep apnea)    "just use my oxygen; no mask" (06/30/2015)  . Other and unspecified hyperlipidemia   . Penetrating foot wound    left nonhealing foot wound on the dorsal surface  . Peripheral neuropathy   . Pneumonia "many of times"  . Refusal of blood transfusions as patient is Jehovah's Witness   . Renal failure, unspecified    "kidneys not working 100%" (06/30/2015)  . Seizure ([place])   . Sickle cell trait ([place])   . Systemic lupus ([place])   . Thoracic aortic atherosclerosis ([place]) 01/14/2017  . Type II diabetes mellitus ([place])   . Unspecified vitamin D deficiency    Allergies  Allergen Reactions  . Ace Inhibitors Other (See Comments)    Coincided with sig bump in creat. Retried and creat bumped again.   . Isosorb Dinitrate-Hydralazine Other (See Comments)    Sleep all the time.   Marland Kitchen Penicillins Anaphylaxis, Swelling and Rash    Has patient had a PCN reaction causing immediate rash, facial/tongue/throat swelling, SOB or lightheadedness with hypotension: Yes Has patient had a PCN reaction causing severe rash involving mucus membranes or skin necrosis: Yes Has patient had a PCN reaction that required hospitalization: Yes Has patient had a PCN reaction occurring within the last 10 years: No If all of the above answers are "NO", then may proceed with Cephalosporin use.   Marland Kitchen Buspirone Other (See Comments)    pain   . Pregabalin Swelling  . Ropinirole Hydrochloride Swelling  . [place] Rash    "Swelling of the tongue"    Social History   Socioeconomic History  . Marital status: Single    Spouse name: Not on file  . Number of children: 4  . Years of education: 19 th  . Highest education level: Not on  file  Occupational History  . Occupation: Retired- Engineer, production    Employer: RETIRED  Tobacco Use  . Smoking status: Former Smoker    Packs/day: 3.00  Years: 40.00    Pack years: 120.00    Types: Cigarettes    Start date: 07/05/1960    Quit date: 07/05/1998    Years since quitting: 21.4  . Smokeless tobacco: Never Used  Substance and Sexual Activity  . Alcohol use: No    Alcohol/week: 0.0 standard drinks    Comment: havent drank in over 40 years  . Drug use: No  . Sexual activity: Never  Other Topics Concern  . Not on file  Social History Narrative   Health Care POA:    Emergency Contact: daughter, Lorenso Courier [phone] or [name] [phone]   End of Life Plan: Does not want to be ventilated or feeding tubes.    Who lives with you: Lives alone   Any pets: none   Diet: Patient reports enjoying and eating junk food.  Does not regulate types of food and currently her dentures are broken so it is hard to eat some foods.   Exercise: Patient does not have an exercise plan.   Seatbelts: Patient reports wearing seatbelt when in vehicle.   Sun Exposure/Protection: Patient reports not going outside very often.   Hobbies: Patient enjoys reading the bible and watching game shows.    Right-handed.   1 cup caffeine per day.   Former smoker-stopped 2000   Alcohol none   Social Determinants of Radio broadcast assistant Strain:   . Difficulty of Paying Living Expenses:   Food Insecurity:   . Worried About Charity fundraiser in the Last Year:   . Arboriculturist in the Last Year:   Transportation Needs:   . Film/video editor (Medical):   Marland Kitchen Lack of Transportation (Non-Medical):   Physical Activity:   . Days of Exercise per Week:   . Minutes of Exercise per Session:   Stress:   . Feeling of Stress :   Social Connections:   . Frequency of Communication with Friends and Family:   . Frequency of Social Gatherings with Friends and Family:   . Attends Religious  Services:   . Active Member of Clubs or Organizations:   . Attends Archivist Meetings:   Marland Kitchen Marital Status:     Vitals:   12/11/19 1423  BP: 132/86  Pulse: 80  Resp: 16  Temp: (!) 97.3 F (36.3 C)  SpO2: 95%   Body mass index is 49.91 kg/m.  Physical Exam  Nursing note and vitals reviewed. Constitutional: She is oriented to person, place, and time. She appears well-developed. No distress.  HENT:  Head: Normocephalic and atraumatic.  Mouth/Throat: Oropharynx is clear and moist and mucous membranes are normal. She has dentures.  Eyes: Pupils are equal, round, and reactive to light. Conjunctivae are normal.  Cardiovascular: Normal rate and regular rhythm.  No murmur heard. Pulses:      Dorsalis pedis pulses are 2+ on the right side and 2+ on the left side.  Respiratory: Effort normal and breath sounds normal. No respiratory distress.  GI: Soft. She exhibits no mass. There is no abdominal tenderness.  Musculoskeletal:        General: Edema (Trace pitting lE edema, bilateral.) present.  Lymphadenopathy:    She has no cervical adenopathy.  Neurological: She is alert and oriented to person, place, and time. She has normal strength. No cranial nerve deficit.  Gait assisted by a walker.  Skin: Skin is warm. No rash noted. No erythema.  Psychiatric: Her mood appears anxious.  Well groomed, good eye contact.  ASSESSMENT AND PLAN:  Mr. Claudina was seen today for hospitalization follow-up.  Diagnoses and all orders for this visit:  Orders Placed This Encounter  Procedures  . Basic metabolic panel  . CBC  . Magnesium  . Hepatic function panel   Lab Results  Component Value Date   ALT 14 12/11/2019   AST 23 12/11/2019   ALKPHOS 115 12/11/2019   BILITOT 0.3 12/11/2019   Lab Results  Component Value Date   CREATININE 1.90 (H) 12/11/2019   BUN 44 (H) 12/11/2019   NA 137 12/11/2019   K 3.8 12/11/2019   CL 100 12/11/2019   CO2 28 12/11/2019   Lab Results    Component Value Date   WBC 15.1 (H) 12/11/2019   HGB 10.4 (L) 12/11/2019   HCT 32.2 (L) 12/11/2019   MCV 77.6 (L) 12/11/2019   PLT 236.0 12/11/2019    Hypomagnesemia She is not on Mg supplementation. Further recommendations according to lab results.  Chronic combined systolic and diastolic CHF, NYHA class 2 (HCC) Systolic function has improved. Continue Torsemide 20 mg daily and Metoprolol Succinate 50 mg daily. Low salt diet. Follows with cardiologist.  Chronic kidney disease (CKD), stage IV (severe) ([place]) Renal function has been stable during 2nd hospitalization. Cr fluctuates between 1.5-1.8 and e GFR 29-31. Adequate hydration. Appt with nephrologist pending. Furthre recommendations according to BMP result.  COPD (chronic obstructive pulmonary disease) ([place]) Today lung auscultation negative. Continue Symbicort 160-4.5 mcg bid and Albuterol inh as needed. Instructed about warning signs. F/U in 4 weeks.   Renal cyst, right It has been stable sine 2015. Reassured. We reviewed findings of abdominal CT done in 2015.   Hypertensive heart disease with heart failure (HCC) BP adequately controlled. No changes in current management. Continue monitoring BP. Low salt diet recommended.   Anemia Combination of chronic disease and iron deficiency. H/H10's/31. Colonoscopy 12/2013 sessile polyp ,otherwise negative.  Continue Fe sulfate 325 mg daily.   Left ventricular thrombus Eliquis 5 mg bid to continue. Last Cr 1.68. If Cr gets worse, we may need to decrease dose from 5 mg to 2.5 mg bid (79 yo). Some side effects discussed. Keeps appt with cardiologist.   Return in about 4 weeks (around 01/08/2020).   Shar Paez G. [name], MD  [facility]. [place] office.  A few things to remember from today's visit:   No changes today. Appt with nephro will be arranged. Low salt diet very important.  Monitor oxygen with finger device regularly. Keep appt with  cardiologist.  You can get your 2nd COVID 19 vaccine.   Renal cyst in right kidney seem to be benign, no further follow up needed at this time.  Please go to [facility] today for labs.  If you need refills please call your pharmacy. Do not use My Chart to request refills or for acute issues that need immediate attention.    Please be sure medication list is accurate. If a new problem present, please set up appointment sooner than planned today.

## 2019-12-11 NOTE — Assessment & Plan Note (Signed)
Today lung auscultation negative. Continue Symbicort 160-4.5 mcg bid and Albuterol inh as needed. Instructed about warning signs. F/U in 4 weeks.

## 2019-12-11 NOTE — Assessment & Plan Note (Signed)
Combination of chronic disease and iron deficiency. H/H10's/31. Colonoscopy 12/2013 sessile polyp ,otherwise negative.  Continue Fe sulfate 325 mg daily.

## 2019-12-11 NOTE — Assessment & Plan Note (Signed)
Eliquis 5 mg bid to continue. Last Cr 1.68. If Cr gets worse, we may need to decrease dose from 5 mg to 2.5 mg bid (78 yo). Some side effects discussed. Keeps appt with cardiologist.

## 2019-12-11 NOTE — Assessment & Plan Note (Signed)
BP adequately controlled. No changes in current management. Continue monitoring BP. Low salt diet recommended.

## 2019-12-12 ENCOUNTER — Telehealth: Payer: Self-pay | Admitting: Family Medicine

## 2019-12-12 NOTE — Telephone Encounter (Signed)
Noted. FYI sent to PCP for review.

## 2019-12-12 NOTE — Telephone Encounter (Signed)
Regarding a delay  Recently in the hospital and they got a referral to see her this week  They were planning on seeing her Monday  The daughter called them back to say that the patient has doctor appointments this week and want them to delay till Friday  Need Dr. [name] to know that is the reason for the delay in home health  [place]

## 2019-12-13 ENCOUNTER — Ambulatory Visit: Payer: Medicare Other | Admitting: Cardiology

## 2019-12-13 ENCOUNTER — Other Ambulatory Visit: Payer: Self-pay | Admitting: Pharmacist

## 2019-12-13 ENCOUNTER — Other Ambulatory Visit: Payer: Self-pay

## 2019-12-13 ENCOUNTER — Encounter: Payer: Self-pay | Admitting: Cardiology

## 2019-12-13 ENCOUNTER — Telehealth: Payer: Self-pay | Admitting: Family Medicine

## 2019-12-13 VITALS — BP 152/55 | HR 61 | Ht 63.5 in | Wt 286.6 lb

## 2019-12-13 DIAGNOSIS — I513 Intracardiac thrombosis, not elsewhere classified: Secondary | ICD-10-CM | POA: Diagnosis not present

## 2019-12-13 DIAGNOSIS — M25869 Other specified joint disorders, unspecified knee: Secondary | ICD-10-CM | POA: Diagnosis not present

## 2019-12-13 DIAGNOSIS — I5022 Chronic systolic (congestive) heart failure: Secondary | ICD-10-CM | POA: Diagnosis not present

## 2019-12-13 DIAGNOSIS — I25118 Atherosclerotic heart disease of native coronary artery with other forms of angina pectoris: Secondary | ICD-10-CM | POA: Diagnosis not present

## 2019-12-13 DIAGNOSIS — I10 Essential (primary) hypertension: Secondary | ICD-10-CM | POA: Diagnosis not present

## 2019-12-13 DIAGNOSIS — J45909 Unspecified asthma, uncomplicated: Secondary | ICD-10-CM | POA: Diagnosis not present

## 2019-12-13 MED ORDER — WARFARIN SODIUM 5 MG PO TABS
7.5000 mg | ORAL_TABLET | Freq: Every day | ORAL | 3 refills | Status: DC
Start: 2019-12-13 — End: 2020-03-28

## 2019-12-13 NOTE — Telephone Encounter (Signed)
Pt notified of update by daughter. Nothing further needed.

## 2019-12-13 NOTE — Progress Notes (Signed)
Subjective:   Brittany Roberts, female    DOB: [birth year], 79 y.o.   MRN: [id number]   Chief complaint:  Shortness of breath, hospital f/u  79 year-old African-American female with nonischemic cardiomyopathy, CAD (PCI RCA), obesity, hypertension, type II diabetes mellitus, morbid obesity, peripheral neuropathy, lupus.  Hospital note: Patient was last seen by me in 10/2019, and was doing well at that tim. She was admitted to [facility] on 12/05/2019 with chest pressure, shortness of breath, leg swelling. Workup showed pulmonary congestion, elevated BNP. Today, sh was also found to have LV apical thrombus.  She is undergoing appropriate IV diuresis with improvement in her symptoms at this time.  Patient was recently hospitalized in May 2021 with Covid pneumonia.  At that time, she had expressed wishes to be DNR.  She still wants to continue the status.  Acute on chronic systolic heart failure, LV apical thrombus I reviewed patient's historical echocardiograms from the past.  Although, she has always had inferior hypokinesis due to old myocardial injury, apical inferior akinesis was never this pronounced before.  This either may be due to a new infarct, or more likely stress-induced cardiomyopathy.  Recent Covid infection could have been in her stressor.  Recommend anticoagulation.  But warfarin is the only approved medication for using LV apical thrombus, given her age and renal function, I recommend off label use of Eliquis for better compliance and maintenance of therapeutic anticoagulation.  Discussed with pharmacy.  Recommend DVT/PE dose loading with 10 mg twice daily, followed by 5 mg twice daily.  She is improved with her volume status.  Management of systolic heart failure limited due to renal function.  Continue diuresis, may switch to torsemide 20 mg twice daily on discharge.  Blood pressure low normal, thus unable to use BiDil.  Continue beta-blocker.  Since hospital discharge,  patient has been on supplemental oxygen.  Her shortness of breath Is improved.  Blood pressure is usually lower than what it is today.  She has not had any bleeding issues.  Creatinine noted to be elevated at 1.9.  Current Outpatient Medications on File Prior to Visit  Medication Sig Dispense Refill  . Accu-Chek Softclix Lancets lancets Use as instructed 100 each 3  . acetaminophen (TYLENOL) 500 MG tablet Take 500 mg by mouth every 6 (six) hours as needed for moderate pain.    Marland Kitchen allopurinol (ZYLOPRIM) 100 MG tablet Take 1 tablet (100 mg total) by mouth daily. 90 tablet 2  . amLODipine (NORVASC) 5 MG tablet Take 1 tablet (5 mg total) by mouth daily. 90 tablet 3  . apixaban (ELIQUIS) 5 MG TABS tablet Take 1-2 tablets (5-10 mg total) by mouth 2 (two) times daily. Take 2 tablets (10 mg total) twice daily through 12/12/2019 and then from 12/13/2019 take 1 tablet (5 mg total) twice daily. 60 tablet 0  . Ascorbic Acid (VITAMIN C PO) Take 1 tablet by mouth daily.    Marland Kitchen atorvastatin (LIPITOR) 20 MG tablet TAKE 1 TABLET(20 MG) BY MOUTH DAILY 90 tablet 0  . B Complex Vitamins (VITAMIN B COMPLEX) TABS Take 1 tablet by mouth daily.    . budesonide-formoterol (SYMBICORT) 80-4.5 MCG/ACT inhaler Inhale 2 puffs into the lungs 2 (two) times daily. 1 Inhaler 3  . Cholecalciferol (VITAMIN D3) 5000 UNITS CAPS Take 5,000 Units by mouth daily.    . DULoxetine (CYMBALTA) 60 MG capsule Take 1 capsule (60 mg total) by mouth daily. 90 capsule 2  . ferrous sulfate 325 (  65 FE) MG tablet Take 325 mg by mouth daily with breakfast.    . Fluocinolone Acetonide Scalp 0.01 % OIL Apply 1 application topically at bedtime as needed (itching scalp).     . gabapentin (NEURONTIN) 100 MG capsule Take 1 capsule (100 mg total) by mouth 2 (two) times daily.    Marland Kitchen glucose blood (ACCU-CHEK AVIVA PLUS) test strip Use to test blood sugar three times daily 100 each 3  . guaiFENesin-dextromethorphan (ROBITUSSIN DM) 100-10 MG/5ML syrup Take 5 mLs by  mouth every 4 (four) hours as needed for cough. 118 mL 0  . Insulin NPH, Human,, Isophane, (HUMULIN N KWIKPEN) 100 UNIT/ML Kiwkpen 10 Units am and 10 Units pm    . Insulin Pen Needle (B-D ULTRAFINE III SHORT PEN) 31G X 8 MM MISC CHECK BLOOD SUGAR 3 TIMES A DAY 100 each 3  . isosorbide mononitrate (IMDUR) 30 MG 24 hr tablet Take 1 tablet (30 mg total) by mouth daily. 90 tablet 3  . levETIRAcetam (KEPPRA) 500 MG tablet Take 1 tablet (500 mg total) by mouth 2 (two) times daily. 180 tablet 3  . linaclotide (LINZESS) 145 MCG CAPS capsule Take 1 capsule (145 mcg total) by mouth daily as needed (abdominal cramping).    . memantine (NAMENDA) 10 MG tablet Take 1 tablet (10 mg total) by mouth 2 (two) times daily. 60 tablet 11  . metoprolol succinate (TOPROL-XL) 50 MG 24 hr tablet Take 1 tablet (50 mg total) by mouth daily. Take with or immediately following a meal. 90 tablet 1  . montelukast (SINGULAIR) 10 MG tablet Take 1 tablet (10 mg total) by mouth at bedtime. 90 tablet 3  . Omega-3 Fatty Acids (FISH OIL) 1000 MG CAPS Take 1,000 mg by mouth daily.     . pantoprazole (PROTONIX) 40 MG tablet TAKE 1 TABLET(40 MG) BY MOUTH DAILY (Patient taking differently: Take 40 mg by mouth daily. ) 90 tablet 3  . Polyethyl Glycol-Propyl Glycol (SYSTANE) 0.4-0.3 % GEL ophthalmic gel Place 1 application into both eyes every 4 (four) hours as needed (dry eyes).    Marland Kitchen PROAIR HFA 108 (90 Base) MCG/ACT inhaler INHALE 2 PUFF BY MOUTH EVERY 4 HOURS AS NEEDED USE ONLY IF YOU ARE WHEEZING (Patient taking differently: Inhale 2 puffs into the lungs every 4 (four) hours as needed for wheezing. ) 8.5 g 3  . Spacer/Aero-Holding Chambers (AEROCHAMBER PLUS) inhaler Use as instructed to use with inahaler. 1 each 1  . torsemide (DEMADEX) 20 MG tablet Take 1 tablet (20 mg total) by mouth 2 (two) times daily. 60 tablet 0   No current facility-administered medications on file prior to visit.    Cardiovascular studies:  EKG 12/13/2019: Sinus  rhythm 73 bpm. Left anterior fascicular block Low boltage Poor R wave progression  Echocardiogram 12/14/2017: - Left ventricle: The cavity size was normal. Systolic function was  mildly reduced. The estimated ejection fraction was in the range  of 45% to 50%. Akinesis of the inferior myocardium. Features are  consistent with a pseudonormal left ventricular filling pattern,  with concomitant abnormal relaxation and increased filling  pressure (grade 2 diastolic dysfunction). - Mitral valve: Mildly thickened leaflets . Chordal calcification.  There was mild regurgitation. Valve area by pressure half-time: 1.47 cm. - Left atrium: The atrium was mildly dilated. - Right atrium: The atrium was mildly dilated. - Pericardium, extracardiac: Trace pericardial effusion was  identified. - Significant improvement in LVEF compared to previous outpatient   echocardiogram in 2018.  Coronary angiography  07/01/2015: 1. Findings consistent with nonischemic cardiomyopathy, LVEF 25-30% with global hypokinesis. 2. Previously placed 2.5 mm DES in the PDA branch of the right coronary artery in 2014 is widely patent. Mid RCA has a 50% stenosis, FFR 0.84, not hemodynamically significant. 3. Diffusely diseased left coronary arteries, especially involving the distal LAD and circumflex coronary artery, may be the etiology for her angina pectoris.   Recent labs: 12/11/2019: Glucose 255, BUN/Cr 44/1.9. EGFR 30. Na/K 137/3.8. Rest of the CMP normal H/H 10.4/32.2. MCV 77.6. Platelets 236  03/14/2019: Glucose 84, BUN/Cr 40/1.87. EGFR 31. Na/K 143/5.2. Rest of the CMP normal HbA1C 6.5% TSH 1.2 normal  07/2018: H/H 12/37. MCV 74. Platelets 239  2016: Chol 103, TG 81, HDL 38, LDL 49  Review of Systems  Constitutional: Negative for decreased appetite, malaise/fatigue, weight gain and weight loss.  HENT: Negative for congestion.   Eyes: Negative for visual disturbance.  Cardiovascular: Positive for chest pain. Negative  for dyspnea on exertion, leg swelling, palpitations and syncope.  Respiratory: Negative for cough.   Endocrine: Negative for cold intolerance.  Hematologic/Lymphatic: Does not bruise/bleed easily.  Skin: Negative for itching and rash.       Rash on face  Musculoskeletal: Positive for joint pain. Negative for myalgias.  Gastrointestinal: Negative for abdominal pain, nausea and vomiting.  Genitourinary: Negative for dysuria.  Neurological: Negative for dizziness and weakness.  Psychiatric/Behavioral: The patient is not nervous/anxious.   All other systems reviewed and are negative.        Vitals:   12/13/19 1004  BP: (!) 152/55  Pulse: 61  SpO2: 99%       Objective:    Physical Exam Vitals and nursing note reviewed.  Constitutional:      Appearance: She is well-developed.  Neck:     Vascular: No JVD.  Cardiovascular:     Rate and Rhythm: Normal rate and regular rhythm.     Pulses: Intact distal pulses.     Heart sounds: Normal heart sounds. No murmur heard.   Pulmonary:     Effort: Pulmonary effort is normal.     Breath sounds: Normal breath sounds. No wheezing or rales.           Assessment & Recommendations:   79 year-old African-American female with nonischemic cardiomyopathy, CAD (PCI RCA), obesity, hypertension, type II diabetes mellitus, morbid obesity, peripheral neuropathy, lupus, now with acute on chronic systolic heart failure, LV apical thrombus.  Chronic systolic heart failure, LV apical thrombus: Clinically euvolemic.  In light of increasing creatinine from 1.6-1.9, I have asked her to reduce torsemide to 20 mg daily. She has been on loading dose of Eliquis, followed by 5 mg twice daily use for management of LV thrombus.  However, given her increasing creatinine, I am concerned about her bleeding risk.  2.5 mg twice daily dose would not be adequate for treatment of presence of LV thrombus.  Therefore, I have decided to switch her to warfarin daily.   She will continue follow-up with her INR clinic.  Recommend warfarin for 3 months.  After that, warfarin could be stopped and aspirin 81 mg could be resumed. Given her advanced and fluctuating CKD, I do not think she will benefit from cardiac catheterization at this time.  Continue medical management.    Nigel Mormon, MD [facility] Pager: [phone] Office: [phone] If no answer Cell [phone]

## 2019-12-13 NOTE — Telephone Encounter (Signed)
Brittany Roberts pt returned your call for lab results

## 2019-12-14 ENCOUNTER — Other Ambulatory Visit: Payer: Self-pay | Admitting: *Deleted

## 2019-12-14 ENCOUNTER — Telehealth: Payer: Self-pay | Admitting: Family Medicine

## 2019-12-14 DIAGNOSIS — M797 Fibromyalgia: Secondary | ICD-10-CM | POA: Diagnosis not present

## 2019-12-14 DIAGNOSIS — D630 Anemia in neoplastic disease: Secondary | ICD-10-CM | POA: Diagnosis not present

## 2019-12-14 DIAGNOSIS — M1030 Gout due to renal impairment, unspecified site: Secondary | ICD-10-CM | POA: Diagnosis not present

## 2019-12-14 DIAGNOSIS — I2510 Atherosclerotic heart disease of native coronary artery without angina pectoris: Secondary | ICD-10-CM | POA: Diagnosis not present

## 2019-12-14 DIAGNOSIS — E1142 Type 2 diabetes mellitus with diabetic polyneuropathy: Secondary | ICD-10-CM | POA: Diagnosis not present

## 2019-12-14 DIAGNOSIS — I700 Atherosclerosis of aorta: Secondary | ICD-10-CM | POA: Diagnosis not present

## 2019-12-14 DIAGNOSIS — N184 Chronic kidney disease, stage 4 (severe): Secondary | ICD-10-CM | POA: Diagnosis not present

## 2019-12-14 DIAGNOSIS — I130 Hypertensive heart and chronic kidney disease with heart failure and stage 1 through stage 4 chronic kidney disease, or unspecified chronic kidney disease: Secondary | ICD-10-CM | POA: Diagnosis not present

## 2019-12-14 DIAGNOSIS — D631 Anemia in chronic kidney disease: Secondary | ICD-10-CM | POA: Diagnosis not present

## 2019-12-14 DIAGNOSIS — I352 Nonrheumatic aortic (valve) stenosis with insufficiency: Secondary | ICD-10-CM | POA: Diagnosis not present

## 2019-12-14 DIAGNOSIS — I472 Ventricular tachycardia: Secondary | ICD-10-CM | POA: Diagnosis not present

## 2019-12-14 DIAGNOSIS — J9811 Atelectasis: Secondary | ICD-10-CM | POA: Diagnosis not present

## 2019-12-14 DIAGNOSIS — E1122 Type 2 diabetes mellitus with diabetic chronic kidney disease: Secondary | ICD-10-CM | POA: Diagnosis not present

## 2019-12-14 DIAGNOSIS — J9621 Acute and chronic respiratory failure with hypoxia: Secondary | ICD-10-CM | POA: Diagnosis not present

## 2019-12-14 DIAGNOSIS — D509 Iron deficiency anemia, unspecified: Secondary | ICD-10-CM | POA: Diagnosis not present

## 2019-12-14 DIAGNOSIS — E1165 Type 2 diabetes mellitus with hyperglycemia: Secondary | ICD-10-CM | POA: Diagnosis not present

## 2019-12-14 DIAGNOSIS — I5043 Acute on chronic combined systolic (congestive) and diastolic (congestive) heart failure: Secondary | ICD-10-CM | POA: Diagnosis not present

## 2019-12-14 DIAGNOSIS — M150 Primary generalized (osteo)arthritis: Secondary | ICD-10-CM | POA: Diagnosis not present

## 2019-12-14 DIAGNOSIS — E039 Hypothyroidism, unspecified: Secondary | ICD-10-CM | POA: Diagnosis not present

## 2019-12-14 NOTE — Patient Outreach (Signed)
[facility]) Care Management  12/14/2019  ANJEL PERFETTI 1941/06/19 [id number]   EMMI-GENERAL DISCHARGE-UNSUCCESSFUL CALL RED ON EMMI ALERT Day # 4 Date:12/13/2019 Red Alert Reason: SAD/HOPELESS/ANXIOUS/EMPTY  OUTREACH #1 RN attempt outreach call today however unsuccessful and unable to leave a message.  PLAN: Will re-scheduled another outreach call over the next well. Will also sent outreach letter.  Raina Mina, RN Care Management Coordinator [place] Office [phone]

## 2019-12-14 NOTE — Telephone Encounter (Signed)
Brittany Roberts from [place] stated she needs to verbal orders for OT, PT, and nursing?   Pt was hospitalized for CHF and they request home health.   Brittany Roberts can be reached at [phone] ok to leave a detailed message per Brittany Roberts

## 2019-12-15 ENCOUNTER — Other Ambulatory Visit: Payer: Self-pay | Admitting: Family Medicine

## 2019-12-15 DIAGNOSIS — M797 Fibromyalgia: Secondary | ICD-10-CM | POA: Diagnosis not present

## 2019-12-15 DIAGNOSIS — D630 Anemia in neoplastic disease: Secondary | ICD-10-CM | POA: Diagnosis not present

## 2019-12-15 DIAGNOSIS — N184 Chronic kidney disease, stage 4 (severe): Secondary | ICD-10-CM | POA: Diagnosis not present

## 2019-12-15 DIAGNOSIS — M150 Primary generalized (osteo)arthritis: Secondary | ICD-10-CM | POA: Diagnosis not present

## 2019-12-15 DIAGNOSIS — E1142 Type 2 diabetes mellitus with diabetic polyneuropathy: Secondary | ICD-10-CM | POA: Diagnosis not present

## 2019-12-15 DIAGNOSIS — I700 Atherosclerosis of aorta: Secondary | ICD-10-CM | POA: Diagnosis not present

## 2019-12-15 DIAGNOSIS — E039 Hypothyroidism, unspecified: Secondary | ICD-10-CM | POA: Diagnosis not present

## 2019-12-15 DIAGNOSIS — E1122 Type 2 diabetes mellitus with diabetic chronic kidney disease: Secondary | ICD-10-CM | POA: Diagnosis not present

## 2019-12-15 DIAGNOSIS — I472 Ventricular tachycardia: Secondary | ICD-10-CM | POA: Diagnosis not present

## 2019-12-15 DIAGNOSIS — I352 Nonrheumatic aortic (valve) stenosis with insufficiency: Secondary | ICD-10-CM | POA: Diagnosis not present

## 2019-12-15 DIAGNOSIS — D509 Iron deficiency anemia, unspecified: Secondary | ICD-10-CM | POA: Diagnosis not present

## 2019-12-15 DIAGNOSIS — I5043 Acute on chronic combined systolic (congestive) and diastolic (congestive) heart failure: Secondary | ICD-10-CM | POA: Diagnosis not present

## 2019-12-15 DIAGNOSIS — J9621 Acute and chronic respiratory failure with hypoxia: Secondary | ICD-10-CM | POA: Diagnosis not present

## 2019-12-15 DIAGNOSIS — M1030 Gout due to renal impairment, unspecified site: Secondary | ICD-10-CM | POA: Diagnosis not present

## 2019-12-15 DIAGNOSIS — E1165 Type 2 diabetes mellitus with hyperglycemia: Secondary | ICD-10-CM | POA: Diagnosis not present

## 2019-12-15 DIAGNOSIS — I2510 Atherosclerotic heart disease of native coronary artery without angina pectoris: Secondary | ICD-10-CM | POA: Diagnosis not present

## 2019-12-15 DIAGNOSIS — D631 Anemia in chronic kidney disease: Secondary | ICD-10-CM | POA: Diagnosis not present

## 2019-12-15 DIAGNOSIS — I130 Hypertensive heart and chronic kidney disease with heart failure and stage 1 through stage 4 chronic kidney disease, or unspecified chronic kidney disease: Secondary | ICD-10-CM | POA: Diagnosis not present

## 2019-12-15 DIAGNOSIS — J9811 Atelectasis: Secondary | ICD-10-CM | POA: Diagnosis not present

## 2019-12-17 ENCOUNTER — Other Ambulatory Visit: Payer: Self-pay

## 2019-12-17 ENCOUNTER — Ambulatory Visit: Payer: Medicare Other | Admitting: Pharmacist

## 2019-12-17 DIAGNOSIS — Z5181 Encounter for therapeutic drug level monitoring: Secondary | ICD-10-CM

## 2019-12-17 DIAGNOSIS — I5043 Acute on chronic combined systolic (congestive) and diastolic (congestive) heart failure: Secondary | ICD-10-CM | POA: Diagnosis not present

## 2019-12-17 DIAGNOSIS — Z7901 Long term (current) use of anticoagulants: Secondary | ICD-10-CM | POA: Diagnosis not present

## 2019-12-17 DIAGNOSIS — N184 Chronic kidney disease, stage 4 (severe): Secondary | ICD-10-CM | POA: Diagnosis not present

## 2019-12-17 DIAGNOSIS — D631 Anemia in chronic kidney disease: Secondary | ICD-10-CM | POA: Diagnosis not present

## 2019-12-17 DIAGNOSIS — E039 Hypothyroidism, unspecified: Secondary | ICD-10-CM | POA: Diagnosis not present

## 2019-12-17 DIAGNOSIS — E1122 Type 2 diabetes mellitus with diabetic chronic kidney disease: Secondary | ICD-10-CM | POA: Diagnosis not present

## 2019-12-17 DIAGNOSIS — I352 Nonrheumatic aortic (valve) stenosis with insufficiency: Secondary | ICD-10-CM | POA: Diagnosis not present

## 2019-12-17 DIAGNOSIS — M1030 Gout due to renal impairment, unspecified site: Secondary | ICD-10-CM | POA: Diagnosis not present

## 2019-12-17 DIAGNOSIS — I513 Intracardiac thrombosis, not elsewhere classified: Secondary | ICD-10-CM | POA: Diagnosis not present

## 2019-12-17 DIAGNOSIS — D630 Anemia in neoplastic disease: Secondary | ICD-10-CM | POA: Diagnosis not present

## 2019-12-17 DIAGNOSIS — I2510 Atherosclerotic heart disease of native coronary artery without angina pectoris: Secondary | ICD-10-CM | POA: Diagnosis not present

## 2019-12-17 DIAGNOSIS — D509 Iron deficiency anemia, unspecified: Secondary | ICD-10-CM | POA: Diagnosis not present

## 2019-12-17 DIAGNOSIS — E1165 Type 2 diabetes mellitus with hyperglycemia: Secondary | ICD-10-CM | POA: Diagnosis not present

## 2019-12-17 DIAGNOSIS — M150 Primary generalized (osteo)arthritis: Secondary | ICD-10-CM | POA: Diagnosis not present

## 2019-12-17 DIAGNOSIS — J9811 Atelectasis: Secondary | ICD-10-CM | POA: Diagnosis not present

## 2019-12-17 DIAGNOSIS — I130 Hypertensive heart and chronic kidney disease with heart failure and stage 1 through stage 4 chronic kidney disease, or unspecified chronic kidney disease: Secondary | ICD-10-CM | POA: Diagnosis not present

## 2019-12-17 DIAGNOSIS — M797 Fibromyalgia: Secondary | ICD-10-CM | POA: Diagnosis not present

## 2019-12-17 DIAGNOSIS — I700 Atherosclerosis of aorta: Secondary | ICD-10-CM | POA: Diagnosis not present

## 2019-12-17 DIAGNOSIS — I472 Ventricular tachycardia: Secondary | ICD-10-CM | POA: Diagnosis not present

## 2019-12-17 DIAGNOSIS — J9621 Acute and chronic respiratory failure with hypoxia: Secondary | ICD-10-CM | POA: Diagnosis not present

## 2019-12-17 DIAGNOSIS — E1142 Type 2 diabetes mellitus with diabetic polyneuropathy: Secondary | ICD-10-CM | POA: Diagnosis not present

## 2019-12-17 LAB — POCT INR: INR: 6 — AB (ref 2.0–3.0)

## 2019-12-17 NOTE — Telephone Encounter (Signed)
Verbal orders given to Brittany Roberts at [facility] for OT, PT and [facility].

## 2019-12-17 NOTE — Patient Instructions (Addendum)
INSTRUCTIONS: INR was above goal today. HOLD warfarin for 3 days. Recheck INR in 3 days.   Warfarin tablets What is this medicine? WARFARIN (WAR far in) is an anticoagulant. It is used to treat or prevent clots in the veins, arteries, lungs, or heart. This medicine may be used for other purposes; ask your health care provider or pharmacist if you have questions. COMMON BRAND NAME(S): Coumadin, Jantoven What should I tell my health care provider before I take this medicine? They need to know if you have any of these conditions:  alcoholism  anemia  bleeding disorders  cancer  diabetes  heart disease  high blood pressure  history of bleeding in the gastrointestinal tract  history of stroke or other brain injury or disease  kidney or liver disease  protein C deficiency  protein S deficiency  psychosis or dementia  recent injury, recent or planned surgery or procedure  an unusual or allergic reaction to warfarin, other medicines, foods, dyes, or preservatives  pregnant or trying to get pregnant  breast-feeding How should I use this medicine? Take this medicine by mouth with a glass of water. Follow the directions on the prescription label. You can take this medicine with or without food. Take your medicine at the same time each day. Do not take it more often than directed. Do not stop taking except on your doctor's advice. Stopping this medicine may increase your risk of a blood clot. Be sure to refill your prescription before you run out of medicine. If your doctor or healthcare professional calls to change your dose, write down the dose and any other instructions. Always read the dose and instructions back to him or her to make sure you understand them. Tell your doctor or healthcare professional what strength of tablets you have on hand. Ask how many tablets you should take to equal your new dose. Write the date on the new instructions and keep them near your medicine. If  you are told to stop taking your medicine until your next blood test, call your doctor or healthcare professional if you do not hear anything within 24 hours of the test to find out your new dose or when to restart your prior dose. A special MedGuide will be given to you by the pharmacist with each prescription and refill. Be sure to read this information carefully each time. Talk to your pediatrician regarding the use of this medicine in children. Special care may be needed. Overdosage: If you think you have taken too much of this medicine contact a poison control center or emergency room at once. NOTE: This medicine is only for you. Do not share this medicine with others. What if I miss a dose? It is important not to miss a dose. If you miss a dose, call your healthcare provider. Take the dose as soon as possible on the same day. If it is almost time for your next dose, take only that dose. Do not take double or extra doses to make up for a missed dose. What may interact with this medicine? Do not take this medicine with any of the following medications:  agents that prevent or dissolve blood clots  aspirin or other salicylates  danshen  dextrothyroxine  mifepristone  St. John's Wort  red yeast rice This medicine may also interact with the following medications:  acetaminophen  agents that lower cholesterol  alcohol  allopurinol  amiodarone  antibiotics or medicines for treating bacterial, fungal or viral infections  azathioprine  barbiturate medicines for inducing sleep or treating seizures  certain medicines for diabetes  certain medicines for heart rhythm problems  certain medicines for hepatitis C virus infections like daclatasvir, dasabuvir; ombitasvir; paritaprevir; ritonavir, elbasvir; grazoprevir, ledipasvir; sofosbuvir, simeprevir, sofosbuvir, sofosbuvir; velpatasvir, sofosbuvir; velpatasvir; voxilaprevir  certain medicines for high blood pressure  chloral  hydrate  cisapride  conivaptan  disulfiram  female hormones, including contraceptive or birth control pills  general anesthetics  herbal or dietary products like garlic, ginkgo, ginseng, green tea, or kava kava  influenza virus vaccine  female hormones  medicines for mental depression or psychosis  medicines for some types of cancer  medicines for stomach problems  methylphenidate  NSAIDs, medicines for pain and inflammation, like ibuprofen or naproxen  propoxyphene  quinidine, quinine  raloxifene  seizure or epilepsy medicine like carbamazepine, phenytoin, and valproic acid  steroids like cortisone and prednisone  tamoxifen  thyroid medicine  tramadol  vitamin c, vitamin e, and vitamin K  zafirlukast  zileuton This list may not describe all possible interactions. Give your health care provider a list of all the medicines, herbs, non-prescription drugs, or dietary supplements you use. Also tell them if you smoke, drink alcohol, or use illegal drugs. Some items may interact with your medicine. What should I watch for while using this medicine? Visit your healthcare professional for regular checks on your progress. You will need to have a blood test called a PT/INR regularly. The PT/INR blood test is done to make sure you are getting the right dose of this medicine. It is important to not miss your appointment for the blood tests. When you first start taking this medicine, these tests are done often. Once the correct dose is determined and you take your medicine properly, these tests can be done less often. Wear a medical ID bracelet or chain, and carry a card that describes your disease and details of your medicine and dosage times. Do not start taking or stop taking any medicines or over-the-counter medicines except on the advice of your healthcare professional. You should discuss your diet with your healthcare professional. Do not make major changes in your diet.  Vitamin K can affect how well this medicine works. Many foods contain vitamin K. It is important to eat a consistent amount of foods with vitamin K. Other foods with vitamin K that you should eat in consistent amounts are asparagus, basil, black-eyed peas, broccoli, brussel sprouts, cabbage, green onions, green tea, parsley, green leafy vegetables like beet greens, collard greens, kale, spinach, turnip greens, or certain lettuces like green leaf or romaine. This medicine can cause birth defects or bleeding in an unborn child. Women of childbearing age should use effective birth control while taking this medicine. If a woman becomes pregnant while taking this medicine, she should discuss the potential risks and her options with her healthcare professional. Avoid sports and activities that might cause injury while you are using this medicine. Severe falls or injuries can cause unseen bleeding. Be careful when using sharp tools or knives. Consider using an Copy. Take special care brushing or flossing your teeth. Report any injuries, bruising, or red spots on the skin to your healthcare professional. If you have an illness that causes vomiting, diarrhea, or fever for more than a few days, contact your health care professional. Also, check with your healthcare professional if you are unable to eat for several days. These problems can change the effect of this medicine. Even after you stop taking this medicine, it  takes several days before your body recovers its normal ability to clot blood. Ask your healthcare professional how long you need to be careful. If you are going to have surgery or dental work, tell your health care professional that you have been taking this medicine. What side effects may I notice from receiving this medicine? Side effects that you should report to your doctor or health care professional as soon as possible:  allergic reactions like skin rash, itching or hives, swelling of  the face, lips, or tongue  heavy menstrual bleeding or vaginal bleeding  painful, blue or purple toes  painful skin ulcers that do not go away  signs and symptoms of bleeding such as bloody or black, tarry stools; red or dark-brown urine; spitting up blood or brown material that looks like coffee grounds; red spots on the skin; unusual bruising or bleeding from the eye, gums, or nose  signs and symptoms of a blood clot such as chest pain; shortness of breath; pain, swelling, or warmth in the leg  signs and symptoms of a stroke such as changes in vision; confusion; trouble speaking or understanding; severe headaches; sudden numbness or weakness of the face, arm or leg; trouble walking; dizziness; loss of coordination  stomach pain  unusually weak or tired Side effects that usually do not require medical attention (report to your doctor or health care professional if they continue or are bothersome):  diarrhea  hair loss This list may not describe all possible side effects. Call your doctor for medical advice about side effects. You may report side effects to FDA at 1-800-FDA-1088. Where should I keep my medicine? Keep out of the reach of children. Store at room temperature between 15 and 30 degrees C (59 and 86 degrees F). Protect from light. Throw away any unused medicine after the expiration date. Do not flush down the toilet. NOTE: This sheet is a summary. It may not cover all possible information. If you have questions about this medicine, talk to your doctor, pharmacist, or health care provider.  2020 Elsevier/Gold Standard (2017-04-06 13:06:45)   Vitamin K Foods and Warfarin Warfarin is a blood thinner (anticoagulant). Anticoagulant medicines help prevent the formation of blood clots. These medicines work by decreasing the activity of vitamin K, which promotes normal blood clotting. When you take warfarin, problems can occur from suddenly increasing or decreasing the amount of  vitamin K that you eat from one day to the next. Problems may include:  Blood clots.  Bleeding. What general guidelines do I need to follow? To avoid problems when taking warfarin:  Eat a balanced diet that includes: ? Fresh fruits and vegetables. ? Whole grains. ? Low-fat dairy products. ? Lean proteins, such as fish, eggs, and lean cuts of meat.  Keep your intake of vitamin K consistent from day to day. To do this: ? Avoid eating large amounts of vitamin K one day and low amounts of vitamin K the next day. ? If you take a multivitamin that contains vitamin K, be sure to take it every day. ? Know which foods contain vitamin K. Use the lists below to understand serving sizes and the amount of vitamin K in one serving.  Avoid major changes in your diet. If you are going to change your diet, talk with your health care provider before making changes.  Work with a Financial planner (dietitian) to develop a meal plan that works best for you.  High vitamin K foods Foods that are high in vitamin  K contain more than 100 mcg (micrograms) per serving. These include:  Broccoli (cooked) -  cup has 110 mcg.  Brussels sprouts (cooked) -  cup has 109 mcg.  Greens, beet (cooked) -  cup has 350 mcg.  Greens, collard (cooked) -  cup has 418 mcg.  Greens, turnip (cooked) -  cup has 265 mcg.  Green onions or scallions -  cup has 105 mcg.  Kale (fresh or frozen) -  cup has 531 mcg.  Parsley (raw) - 10 sprigs has 164 mcg.  Spinach (cooked) -  cup has 444 mcg.  Swiss chard (cooked) -  cup has 287 mcg. Moderate vitamin K foods Foods that have a moderate amount of vitamin K contain 25-100 mcg per serving. These include:  Asparagus (cooked) - 5 spears have 38 mcg.  Black-eyed peas (dried) -  cup has 32 mcg.  Cabbage (cooked) -  cup has 37 mcg.  Kiwi fruit - 1 medium has 31 mcg.  Lettuce - 1 cup has 57-63 mcg.  Okra (frozen) -  cup has 44 mcg.  Prunes (dried) - 5  prunes have 25 mcg.  Watercress (raw) - 1 cup has 85 mcg. Low vitamin K foods Foods low in vitamin K contain less than 25 mcg per serving. These include:  Artichoke - 1 medium has 18 mcg.  Avocado - 1 oz. has 6 mcg.  Blueberries -  cup has 14 mcg.  Cabbage (raw) -  cup has 21 mcg.  Carrots (cooked) -  cup has 11 mcg.  Cauliflower (raw) -  cup has 11 mcg.  Cucumber with peel (raw) -  cup has 9 mcg.  Grapes -  cup has 12 mcg.  Mango - 1 medium has 9 mcg.  Nuts - 1 oz. has 15 mcg.  Pear - 1 medium has 8 mcg.  Peas (cooked) -  cup has 19 mcg.  Pickles - 1 spear has 14 mcg.  Pumpkin seeds - 1 oz. has 13 mcg.  Sauerkraut (canned) -  cup has 16 mcg.  Soybeans (cooked) -  cup has 16 mcg.  Tomato (raw) - 1 medium has 10 mcg.  Tomato sauce -  cup has 17 mcg. Vitamin K-free foods If a food contain less than 5 mcg per serving, it is considered to have no vitamin K. These foods include:  Bread and cereal products.  Cheese.  Eggs.  Fish and shellfish.  Meat and poultry.  Milk and dairy products.  Sunflower seeds. Actual amounts of vitamin K in foods may be different depending on processing. Talk with your dietitian about what foods you can eat and what foods you should avoid. This information is not intended to replace advice given to you by your health care provider. Make sure you discuss any questions you have with your health care provider. Document Revised: 06/03/2017 Document Reviewed: 09/24/2015 Elsevier Patient Education  2020 [employer].

## 2019-12-17 NOTE — Progress Notes (Signed)
Anticoagulation Management Brittany Roberts is a 79 y.o. female who reports to the clinic for monitoring of warfarin treatment.    Indication: LV Thrombus  Duration: 3 months (expected end date 03/2020) Supervising physician: [facility] Visit History:  Patient does not report signs/symptoms of bleeding or thromboembolism  Other recent changes: No change in diet, medications, lifestyle. Pt initially treated with heparin and then transitioned to eliquis on 12/06/19. Pt started on loading dose eliquis and then 5 mg BID. Per Dr. Bonney Roussel note on 6/10: "given her increasing creatinine, I am concerned about her bleeding risk. 2.5 mg twice daily dose would not be adequate for treatment of presence of LV thrombus. Therefore, I have decided to switch her to warfarin daily". Pt was started warfarin and instructed to continue her eliquis for 3 days before stopping eliquis and continuing daily warfarin.   Anticoagulation Episode Summary    Current INR goal:  2.0-3.0  TTR:  --  Next INR check:  12/20/2019  INR from last check:  6.0 (12/17/2019)  Weekly max warfarin dose:    Target end date:  03/14/2020  INR check location:    Preferred lab:    Send INR reminders to:     Indications   LV (left ventricular) mural thrombus [I51.3] Monitoring for long-term anticoagulant use [Z51.81 Z79.01]       Comments:        Anticoagulation Care Providers    Provider Role Specialty Phone number   Nigel Mormon, MD  Cardiology [phone]      Allergies  Allergen Reactions  . Ace Inhibitors Other (See Comments)    Coincided with sig bump in creat. Retried and creat bumped again.   . Isosorb Dinitrate-Hydralazine Other (See Comments)    Sleep all the time.   Marland Kitchen Penicillins Anaphylaxis, Swelling and Rash    Has patient had a PCN reaction causing immediate rash, facial/tongue/throat swelling, SOB or lightheadedness with hypotension: Yes Has patient had a PCN reaction  causing severe rash involving mucus membranes or skin necrosis: Yes Has patient had a PCN reaction that required hospitalization: Yes Has patient had a PCN reaction occurring within the last 10 years: No If all of the above answers are "NO", then may proceed with Cephalosporin use.   Marland Kitchen Buspirone Other (See Comments)    pain   . Pregabalin Swelling  . Ropinirole Hydrochloride Swelling  . Amantadines Rash    "Swelling of the tongue"    Current Outpatient Medications:  .  Accu-Chek Softclix Lancets lancets, Use as instructed, Disp: 100 each, Rfl: 3 .  acetaminophen (TYLENOL) 500 MG tablet, Take 500 mg by mouth every 6 (six) hours as needed for moderate pain., Disp: , Rfl:  .  allopurinol (ZYLOPRIM) 100 MG tablet, Take 1 tablet (100 mg total) by mouth daily., Disp: 90 tablet, Rfl: 2 .  amLODipine (NORVASC) 5 MG tablet, Take 1 tablet (5 mg total) by mouth daily., Disp: 90 tablet, Rfl: 3 .  apixaban (ELIQUIS) 5 MG TABS tablet, Take 1-2 tablets (5-10 mg total) by mouth 2 (two) times daily. Take 2 tablets (10 mg total) twice daily through 12/12/2019 and then from 12/13/2019 take 1 tablet (5 mg total) twice daily. (Patient not taking: Reported on 12/17/2019), Disp: 60 tablet, Rfl: 0 .  Ascorbic Acid (VITAMIN C PO), Take 1 tablet by mouth daily., Disp: , Rfl:  .  atorvastatin (LIPITOR) 20 MG tablet, TAKE 1 TABLET(20 MG) BY MOUTH DAILY, Disp: 90 tablet, Rfl: 0 .  B Complex Vitamins (VITAMIN B COMPLEX) TABS, Take 1 tablet by mouth daily., Disp: , Rfl:  .  budesonide-formoterol (SYMBICORT) 80-4.5 MCG/ACT inhaler, Inhale 2 puffs into the lungs 2 (two) times daily., Disp: 1 Inhaler, Rfl: 3 .  Cholecalciferol (VITAMIN D3) 5000 UNITS CAPS, Take 5,000 Units by mouth daily., Disp: , Rfl:  .  DULoxetine (CYMBALTA) 60 MG capsule, Take 1 capsule (60 mg total) by mouth daily., Disp: 90 capsule, Rfl: 2 .  ferrous sulfate 325 (65 FE) MG tablet, Take 325 mg by mouth daily with breakfast., Disp: , Rfl:  .  Fluocinolone  Acetonide Scalp 0.01 % OIL, Apply 1 application topically at bedtime as needed (itching scalp). , Disp: , Rfl:  .  gabapentin (NEURONTIN) 100 MG capsule, Take 1 capsule (100 mg total) by mouth 2 (two) times daily., Disp:  , Rfl:  .  glucose blood (ACCU-CHEK AVIVA PLUS) test strip, Use to test blood sugar three times daily, Disp: 100 each, Rfl: 3 .  guaiFENesin-dextromethorphan (ROBITUSSIN DM) 100-10 MG/5ML syrup, Take 5 mLs by mouth every 4 (four) hours as needed for cough., Disp: 118 mL, Rfl: 0 .  Insulin NPH, Human,, Isophane, (HUMULIN N KWIKPEN) 100 UNIT/ML Kiwkpen, 10 Units am and 10 Units pm, Disp: , Rfl:  .  Insulin Pen Needle (B-D ULTRAFINE III SHORT PEN) 31G X 8 MM MISC, CHECK BLOOD SUGAR 3 TIMES A DAY, Disp: 100 each, Rfl: 3 .  isosorbide mononitrate (IMDUR) 30 MG 24 hr tablet, Take 1 tablet (30 mg total) by mouth daily., Disp: 90 tablet, Rfl: 3 .  levETIRAcetam (KEPPRA) 500 MG tablet, Take 1 tablet (500 mg total) by mouth 2 (two) times daily., Disp: 180 tablet, Rfl: 3 .  linaclotide (LINZESS) 145 MCG CAPS capsule, Take 1 capsule (145 mcg total) by mouth daily as needed (abdominal cramping)., Disp: , Rfl:  .  memantine (NAMENDA) 10 MG tablet, Take 1 tablet (10 mg total) by mouth 2 (two) times daily., Disp: 60 tablet, Rfl: 11 .  metoprolol succinate (TOPROL-XL) 50 MG 24 hr tablet, TAKE 1 TABLET BY MOUTH EVERY DAY. TAKE WITH OR IMMEDIATELY FOLLOWING A MEAL, Disp: 90 tablet, Rfl: 1 .  montelukast (SINGULAIR) 10 MG tablet, Take 1 tablet (10 mg total) by mouth at bedtime., Disp: 90 tablet, Rfl: 3 .  Omega-3 Fatty Acids (FISH OIL) 1000 MG CAPS, Take 1,000 mg by mouth daily. , Disp: , Rfl:  .  pantoprazole (PROTONIX) 40 MG tablet, TAKE 1 TABLET(40 MG) BY MOUTH DAILY (Patient taking differently: Take 40 mg by mouth daily. ), Disp: 90 tablet, Rfl: 3 .  Polyethyl Glycol-Propyl Glycol (SYSTANE) 0.4-0.3 % GEL ophthalmic gel, Place 1 application into both eyes every 4 (four) hours as needed (dry eyes).,  Disp: , Rfl:  .  PROAIR HFA 108 (90 Base) MCG/ACT inhaler, INHALE 2 PUFF BY MOUTH EVERY 4 HOURS AS NEEDED USE ONLY IF YOU ARE WHEEZING (Patient taking differently: Inhale 2 puffs into the lungs every 4 (four) hours as needed for wheezing. ), Disp: 8.5 g, Rfl: 3 .  Spacer/Aero-Holding Chambers (AEROCHAMBER PLUS) inhaler, Use as instructed to use with inahaler., Disp: 1 each, Rfl: 1 .  torsemide (DEMADEX) 20 MG tablet, Take 1 tablet (20 mg total) by mouth 2 (two) times daily., Disp: 60 tablet, Rfl: 0 .  warfarin (COUMADIN) 5 MG tablet, Take 1.5 tablets (7.5 mg total) by mouth daily., Disp: 45 tablet, Rfl: 3 Past Medical History:  Diagnosis Date  . Acute delirium 01/14/2017  . Anxiety   .  Arthritis    "all over"  . Asthma   . Bipolar disorder ([place])   . Complication of anesthesia    "w/right foot OR they gave me too much and they couldn't get me woke"  . Congestive heart failure, unspecified   . Coronary artery calcification seen on CAT scan 01/14/2017  . Coronary artery disease    "I've got 1 stent" (06/30/2015)  . Depression   . Discoid lupus   . Fibromyalgia    "I've been told I have this; can't take Lyrica cause I'm allergic to it" (06/30/2015)  . GERD (gastroesophageal reflux disease)   . History of gout   . History of hiatal hernia    "real bad" (06/30/2015)  . Hypertension   . Hypothyroidism   . Memory loss   . On home oxygen therapy    "2L q hs" (06/30/2015)  . OSA (obstructive sleep apnea)    "just use my oxygen; no mask" (06/30/2015)  . Other and unspecified hyperlipidemia   . Penetrating foot wound    left nonhealing foot wound on the dorsal surface  . Peripheral neuropathy   . Pneumonia "many of times"  . Refusal of blood transfusions as patient is Jehovah's Witness   . Renal failure, unspecified    "kidneys not working 100%" (06/30/2015)  . Seizure ([place])   . Sickle cell trait ([place])   . Systemic lupus ([place])   . Thoracic aortic atherosclerosis ([place]) 01/14/2017  .  Type II diabetes mellitus ([place])   . Unspecified vitamin D deficiency     ASSESSMENT  Recent Results: The most recent result is correlated with 30 mg per week:  Lab Results  Component Value Date   INR 6.0 (A) 12/17/2019   INR 1.0 11/01/2019   INR 1.05 06/30/2015    Anticoagulation Dosing: Description   INR was above goal today. HOLD warfarin for 3 days. Recheck INR in 3 days.       INR today: Supratherapeutic. New start warfarin. Pt transitioned from eliquis to warfarin on higher daily warfarin to limit the need for LMWH in setting of poor renal function. Started on high dose with the expectation of pt being INR>2 quicker and to reduce risk of thromboembolic event during the transition. Pt noted to have patient related factors that would make her more sensitive to warfarin (Hx of CHF, poor renal function, frail and elderly). Reviewed warfarin dosing instructions, MOA, ADRs, bleeding risk mitigation, DDI, food interactions, need for regular and frequent INR checks, missed dose directions, etc. Provided pt with handout for warfarin tablets and Vit K Foods and Warfarin. Answered pt and daughter's question regarding warfarin management and monitoring. Pt denies any current bleeding or bruising symptoms. Pt would likely need a much lower weekly dose. Initially started on 7.5 mg weekly. INR likely to trend up over the next couple of days. Pt aware to monitor for s/sx of bleeding. Will have pt hold for 3 days and recheck INR. Will continue close monitoring and follow up to ensure pt remains within therapeutic range.   PLAN  Weekly dose was deferred at this time. Will have pt hold warfarin dose for 3 days.   Patient Instructions  INSTRUCTIONS: INR was above goal today. HOLD warfarin for 3 days. Recheck INR in 3 days.   Warfarin tablets What is this medicine? WARFARIN (WAR far in) is an anticoagulant. It is used to treat or prevent clots in the veins, arteries, lungs, or heart. This medicine  may be used for other  purposes; ask your health care provider or pharmacist if you have questions. COMMON BRAND NAME(S): Coumadin, Jantoven What should I tell my health care provider before I take this medicine? They need to know if you have any of these conditions:  alcoholism  anemia  bleeding disorders  cancer  diabetes  heart disease  high blood pressure  history of bleeding in the gastrointestinal tract  history of stroke or other brain injury or disease  kidney or liver disease  protein C deficiency  protein S deficiency  psychosis or dementia  recent injury, recent or planned surgery or procedure  an unusual or allergic reaction to warfarin, other medicines, foods, dyes, or preservatives  pregnant or trying to get pregnant  breast-feeding How should I use this medicine? Take this medicine by mouth with a glass of water. Follow the directions on the prescription label. You can take this medicine with or without food. Take your medicine at the same time each day. Do not take it more often than directed. Do not stop taking except on your doctor's advice. Stopping this medicine may increase your risk of a blood clot. Be sure to refill your prescription before you run out of medicine. If your doctor or healthcare professional calls to change your dose, write down the dose and any other instructions. Always read the dose and instructions back to him or her to make sure you understand them. Tell your doctor or healthcare professional what strength of tablets you have on hand. Ask how many tablets you should take to equal your new dose. Write the date on the new instructions and keep them near your medicine. If you are told to stop taking your medicine until your next blood test, call your doctor or healthcare professional if you do not hear anything within 24 hours of the test to find out your new dose or when to restart your prior dose. A special MedGuide will be given to  you by the pharmacist with each prescription and refill. Be sure to read this information carefully each time. Talk to your pediatrician regarding the use of this medicine in children. Special care may be needed. Overdosage: If you think you have taken too much of this medicine contact a poison control center or emergency room at once. NOTE: This medicine is only for you. Do not share this medicine with others. What if I miss a dose? It is important not to miss a dose. If you miss a dose, call your healthcare provider. Take the dose as soon as possible on the same day. If it is almost time for your next dose, take only that dose. Do not take double or extra doses to make up for a missed dose. What may interact with this medicine? Do not take this medicine with any of the following medications:  agents that prevent or dissolve blood clots  aspirin or other salicylates  danshen  dextrothyroxine  mifepristone  St. John's Wort  red yeast rice This medicine may also interact with the following medications:  acetaminophen  agents that lower cholesterol  alcohol  allopurinol  amiodarone  antibiotics or medicines for treating bacterial, fungal or viral infections  azathioprine  barbiturate medicines for inducing sleep or treating seizures  certain medicines for diabetes  certain medicines for heart rhythm problems  certain medicines for hepatitis C virus infections like daclatasvir, dasabuvir; ombitasvir; paritaprevir; ritonavir, elbasvir; grazoprevir, ledipasvir; sofosbuvir, simeprevir, sofosbuvir, sofosbuvir; velpatasvir, sofosbuvir; velpatasvir; voxilaprevir  certain medicines for high blood pressure  chloral hydrate  cisapride  conivaptan  disulfiram  female hormones, including contraceptive or birth control pills  general anesthetics  herbal or dietary products like garlic, ginkgo, ginseng, green tea, or kava kava  influenza virus vaccine  female  hormones  medicines for mental depression or psychosis  medicines for some types of cancer  medicines for stomach problems  methylphenidate  NSAIDs, medicines for pain and inflammation, like ibuprofen or naproxen  propoxyphene  quinidine, quinine  raloxifene  seizure or epilepsy medicine like carbamazepine, phenytoin, and valproic acid  steroids like cortisone and prednisone  tamoxifen  thyroid medicine  tramadol  vitamin c, vitamin e, and vitamin K  zafirlukast  zileuton This list may not describe all possible interactions. Give your health care provider a list of all the medicines, herbs, non-prescription drugs, or dietary supplements you use. Also tell them if you smoke, drink alcohol, or use illegal drugs. Some items may interact with your medicine. What should I watch for while using this medicine? Visit your healthcare professional for regular checks on your progress. You will need to have a blood test called a PT/INR regularly. The PT/INR blood test is done to make sure you are getting the right dose of this medicine. It is important to not miss your appointment for the blood tests. When you first start taking this medicine, these tests are done often. Once the correct dose is determined and you take your medicine properly, these tests can be done less often. Wear a medical ID bracelet or chain, and carry a card that describes your disease and details of your medicine and dosage times. Do not start taking or stop taking any medicines or over-the-counter medicines except on the advice of your healthcare professional. You should discuss your diet with your healthcare professional. Do not make major changes in your diet. Vitamin K can affect how well this medicine works. Many foods contain vitamin K. It is important to eat a consistent amount of foods with vitamin K. Other foods with vitamin K that you should eat in consistent amounts are asparagus, basil, black-eyed peas,  broccoli, brussel sprouts, cabbage, green onions, green tea, parsley, green leafy vegetables like beet greens, collard greens, kale, spinach, turnip greens, or certain lettuces like green leaf or romaine. This medicine can cause birth defects or bleeding in an unborn child. Women of childbearing age should use effective birth control while taking this medicine. If a woman becomes pregnant while taking this medicine, she should discuss the potential risks and her options with her healthcare professional. Avoid sports and activities that might cause injury while you are using this medicine. Severe falls or injuries can cause unseen bleeding. Be careful when using sharp tools or knives. Consider using an Copy. Take special care brushing or flossing your teeth. Report any injuries, bruising, or red spots on the skin to your healthcare professional. If you have an illness that causes vomiting, diarrhea, or fever for more than a few days, contact your health care professional. Also, check with your healthcare professional if you are unable to eat for several days. These problems can change the effect of this medicine. Even after you stop taking this medicine, it takes several days before your body recovers its normal ability to clot blood. Ask your healthcare professional how long you need to be careful. If you are going to have surgery or dental work, tell your health care professional that you have been taking this medicine. What side effects may I notice from  receiving this medicine? Side effects that you should report to your doctor or health care professional as soon as possible:  allergic reactions like skin rash, itching or hives, swelling of the face, lips, or tongue  heavy menstrual bleeding or vaginal bleeding  painful, blue or purple toes  painful skin ulcers that do not go away  signs and symptoms of bleeding such as bloody or black, tarry stools; red or dark-brown urine; spitting up  blood or brown material that looks like coffee grounds; red spots on the skin; unusual bruising or bleeding from the eye, gums, or nose  signs and symptoms of a blood clot such as chest pain; shortness of breath; pain, swelling, or warmth in the leg  signs and symptoms of a stroke such as changes in vision; confusion; trouble speaking or understanding; severe headaches; sudden numbness or weakness of the face, arm or leg; trouble walking; dizziness; loss of coordination  stomach pain  unusually weak or tired Side effects that usually do not require medical attention (report to your doctor or health care professional if they continue or are bothersome):  diarrhea  hair loss This list may not describe all possible side effects. Call your doctor for medical advice about side effects. You may report side effects to FDA at 1-800-FDA-1088. Where should I keep my medicine? Keep out of the reach of children. Store at room temperature between 15 and 30 degrees C (59 and 86 degrees F). Protect from light. Throw away any unused medicine after the expiration date. Do not flush down the toilet. NOTE: This sheet is a summary. It may not cover all possible information. If you have questions about this medicine, talk to your doctor, pharmacist, or health care provider.  2020 Elsevier/Gold Standard (2017-04-06 13:06:45)   Vitamin K Foods and Warfarin Warfarin is a blood thinner (anticoagulant). Anticoagulant medicines help prevent the formation of blood clots. These medicines work by decreasing the activity of vitamin K, which promotes normal blood clotting. When you take warfarin, problems can occur from suddenly increasing or decreasing the amount of vitamin K that you eat from one day to the next. Problems may include:  Blood clots.  Bleeding. What general guidelines do I need to follow? To avoid problems when taking warfarin:  Eat a balanced diet that includes: ? Fresh fruits and  vegetables. ? Whole grains. ? Low-fat dairy products. ? Lean proteins, such as fish, eggs, and lean cuts of meat.  Keep your intake of vitamin K consistent from day to day. To do this: ? Avoid eating large amounts of vitamin K one day and low amounts of vitamin K the next day. ? If you take a multivitamin that contains vitamin K, be sure to take it every day. ? Know which foods contain vitamin K. Use the lists below to understand serving sizes and the amount of vitamin K in one serving.  Avoid major changes in your diet. If you are going to change your diet, talk with your health care provider before making changes.  Work with a Financial planner (dietitian) to develop a meal plan that works best for you.  High vitamin K foods Foods that are high in vitamin K contain more than 100 mcg (micrograms) per serving. These include:  Broccoli (cooked) -  cup has 110 mcg.  Brussels sprouts (cooked) -  cup has 109 mcg.  Greens, beet (cooked) -  cup has 350 mcg.  Greens, collard (cooked) -  cup has 418 mcg.  Greens, turnip (  cooked) -  cup has 265 mcg.  Green onions or scallions -  cup has 105 mcg.  Kale (fresh or frozen) -  cup has 531 mcg.  Parsley (raw) - 10 sprigs has 164 mcg.  Spinach (cooked) -  cup has 444 mcg.  Swiss chard (cooked) -  cup has 287 mcg. Moderate vitamin K foods Foods that have a moderate amount of vitamin K contain 25-100 mcg per serving. These include:  Asparagus (cooked) - 5 spears have 38 mcg.  Black-eyed peas (dried) -  cup has 32 mcg.  Cabbage (cooked) -  cup has 37 mcg.  Kiwi fruit - 1 medium has 31 mcg.  Lettuce - 1 cup has 57-63 mcg.  Okra (frozen) -  cup has 44 mcg.  Prunes (dried) - 5 prunes have 25 mcg.  Watercress (raw) - 1 cup has 85 mcg. Low vitamin K foods Foods low in vitamin K contain less than 25 mcg per serving. These include:  Artichoke - 1 medium has 18 mcg.  Avocado - 1 oz. has 6 mcg.  Blueberries -  cup has  14 mcg.  Cabbage (raw) -  cup has 21 mcg.  Carrots (cooked) -  cup has 11 mcg.  Cauliflower (raw) -  cup has 11 mcg.  Cucumber with peel (raw) -  cup has 9 mcg.  Grapes -  cup has 12 mcg.  Mango - 1 medium has 9 mcg.  Nuts - 1 oz. has 15 mcg.  Pear - 1 medium has 8 mcg.  Peas (cooked) -  cup has 19 mcg.  Pickles - 1 spear has 14 mcg.  Pumpkin seeds - 1 oz. has 13 mcg.  Sauerkraut (canned) -  cup has 16 mcg.  Soybeans (cooked) -  cup has 16 mcg.  Tomato (raw) - 1 medium has 10 mcg.  Tomato sauce -  cup has 17 mcg. Vitamin K-free foods If a food contain less than 5 mcg per serving, it is considered to have no vitamin K. These foods include:  Bread and cereal products.  Cheese.  Eggs.  Fish and shellfish.  Meat and poultry.  Milk and dairy products.  Sunflower seeds. Actual amounts of vitamin K in foods may be different depending on processing. Talk with your dietitian about what foods you can eat and what foods you should avoid. This information is not intended to replace advice given to you by your health care provider. Make sure you discuss any questions you have with your health care provider. Document Revised: 06/03/2017 Document Reviewed: 09/24/2015 Elsevier Patient Education  2020 [employer].   Patient advised to contact clinic or seek medical attention if signs/symptoms of bleeding or thromboembolism occur.  Patient verbalized understanding by repeating back information and was advised to contact me if further medication-related questions arise.   Follow-up Return in about 3 days (around 12/20/2019).  Alysia Penna, PharmD  15 minutes spent face-to-face with the patient during the encounter. 50% of time spent on education, including signs/sx bleeding and clotting, as well as food and drug interactions with warfarin. 50% of time was spent on fingerprick POC INR sample collection,processing, results determination, and documentation

## 2019-12-18 ENCOUNTER — Other Ambulatory Visit: Payer: Self-pay | Admitting: *Deleted

## 2019-12-18 DIAGNOSIS — M109 Gout, unspecified: Secondary | ICD-10-CM | POA: Diagnosis not present

## 2019-12-18 DIAGNOSIS — I129 Hypertensive chronic kidney disease with stage 1 through stage 4 chronic kidney disease, or unspecified chronic kidney disease: Secondary | ICD-10-CM | POA: Diagnosis not present

## 2019-12-18 DIAGNOSIS — E1122 Type 2 diabetes mellitus with diabetic chronic kidney disease: Secondary | ICD-10-CM | POA: Diagnosis not present

## 2019-12-18 DIAGNOSIS — M329 Systemic lupus erythematosus, unspecified: Secondary | ICD-10-CM | POA: Diagnosis not present

## 2019-12-18 DIAGNOSIS — N189 Chronic kidney disease, unspecified: Secondary | ICD-10-CM | POA: Diagnosis not present

## 2019-12-18 DIAGNOSIS — N1832 Chronic kidney disease, stage 3b: Secondary | ICD-10-CM | POA: Diagnosis not present

## 2019-12-18 NOTE — Patient Outreach (Signed)
[facility]) Care Management  12/18/2019  MALKA BOCEK 1941-04-16 [id number]   EMMI-GENERAL DISCHARGE-RESOLVED RED ON EMMI ALERT Day #4 Date:12/12/2009 Red Alert Reason: Sad/Hopeless, Anxious/Empty  OUTREACH #2 RN spoke with pt today concerning the above emmi. Pt confirms this is not a true statement and denies any sadness/hopeless or symptoms depression. Pt denies any thoughts of suicide and has her supportive daughter at home troubleshooting any issues that arise.  Plan: Will close this case with no needs presented at this time.   Raina Mina, RN Care Management Coordinator [place] [facility] [phone]

## 2019-12-19 ENCOUNTER — Telehealth: Payer: Self-pay

## 2019-12-19 ENCOUNTER — Other Ambulatory Visit: Payer: Self-pay

## 2019-12-19 DIAGNOSIS — M150 Primary generalized (osteo)arthritis: Secondary | ICD-10-CM | POA: Diagnosis not present

## 2019-12-19 DIAGNOSIS — E1122 Type 2 diabetes mellitus with diabetic chronic kidney disease: Secondary | ICD-10-CM | POA: Diagnosis not present

## 2019-12-19 DIAGNOSIS — E039 Hypothyroidism, unspecified: Secondary | ICD-10-CM | POA: Diagnosis not present

## 2019-12-19 DIAGNOSIS — I472 Ventricular tachycardia: Secondary | ICD-10-CM | POA: Diagnosis not present

## 2019-12-19 DIAGNOSIS — M1030 Gout due to renal impairment, unspecified site: Secondary | ICD-10-CM | POA: Diagnosis not present

## 2019-12-19 DIAGNOSIS — D509 Iron deficiency anemia, unspecified: Secondary | ICD-10-CM | POA: Diagnosis not present

## 2019-12-19 DIAGNOSIS — E1142 Type 2 diabetes mellitus with diabetic polyneuropathy: Secondary | ICD-10-CM | POA: Diagnosis not present

## 2019-12-19 DIAGNOSIS — M797 Fibromyalgia: Secondary | ICD-10-CM | POA: Diagnosis not present

## 2019-12-19 DIAGNOSIS — I2510 Atherosclerotic heart disease of native coronary artery without angina pectoris: Secondary | ICD-10-CM | POA: Diagnosis not present

## 2019-12-19 DIAGNOSIS — J9621 Acute and chronic respiratory failure with hypoxia: Secondary | ICD-10-CM | POA: Diagnosis not present

## 2019-12-19 DIAGNOSIS — N184 Chronic kidney disease, stage 4 (severe): Secondary | ICD-10-CM | POA: Diagnosis not present

## 2019-12-19 DIAGNOSIS — D630 Anemia in neoplastic disease: Secondary | ICD-10-CM | POA: Diagnosis not present

## 2019-12-19 DIAGNOSIS — I130 Hypertensive heart and chronic kidney disease with heart failure and stage 1 through stage 4 chronic kidney disease, or unspecified chronic kidney disease: Secondary | ICD-10-CM | POA: Diagnosis not present

## 2019-12-19 DIAGNOSIS — D631 Anemia in chronic kidney disease: Secondary | ICD-10-CM | POA: Diagnosis not present

## 2019-12-19 DIAGNOSIS — I352 Nonrheumatic aortic (valve) stenosis with insufficiency: Secondary | ICD-10-CM | POA: Diagnosis not present

## 2019-12-19 DIAGNOSIS — I5043 Acute on chronic combined systolic (congestive) and diastolic (congestive) heart failure: Secondary | ICD-10-CM | POA: Diagnosis not present

## 2019-12-19 DIAGNOSIS — J9811 Atelectasis: Secondary | ICD-10-CM | POA: Diagnosis not present

## 2019-12-19 DIAGNOSIS — E1165 Type 2 diabetes mellitus with hyperglycemia: Secondary | ICD-10-CM | POA: Diagnosis not present

## 2019-12-19 DIAGNOSIS — I700 Atherosclerosis of aorta: Secondary | ICD-10-CM | POA: Diagnosis not present

## 2019-12-19 NOTE — Telephone Encounter (Signed)
Patient was told to hold Warfarin 6/14, 6/15, 6/16 because INR was 6. Patient has not started Warfarin yet. Should patient wait until next INR (scheduled 6/17) to continue medication therapy? Please advise. Thanks!

## 2019-12-19 NOTE — Telephone Encounter (Signed)
Eliquis needs to be stopped 2 days after starting warfarin. It has been >2 days since starting warfarin. She should now stop eliquis.  Thanks MJP

## 2019-12-19 NOTE — Telephone Encounter (Signed)
Brittany Roberts is helping with her INR. Could you please follow up on the above conversation?  Thanks MJP

## 2019-12-19 NOTE — Telephone Encounter (Signed)
Received a message from [place] caregiver in regards to a possible medication discrepancy. Patient was put on Warfarin during her last visit, however, chart still shows that patient is on Eliquis as well. Should Eliquis be discontinued? Please advise. Thanks!

## 2019-12-19 NOTE — Telephone Encounter (Signed)
Called and reviewed dosing instruction with pt and older daughter. Pt confirmed that she continues to HOLD her warfarin dose as instructed during last INR check. Denies any bleeding and bruising symptoms. Answered daughters and pt's questions. Pt verbalized understanding. Will continue dose optimizing based on INR results tomorrow.

## 2019-12-19 NOTE — Addendum Note (Signed)
Addended by: Manuela Schwartz T on: 12/19/2019 03:31 PM   Modules accepted: Orders

## 2019-12-20 ENCOUNTER — Ambulatory Visit: Payer: Medicare Other | Admitting: Pharmacist

## 2019-12-20 ENCOUNTER — Other Ambulatory Visit: Payer: Self-pay

## 2019-12-20 ENCOUNTER — Telehealth: Payer: Self-pay | Admitting: Family Medicine

## 2019-12-20 DIAGNOSIS — Z7901 Long term (current) use of anticoagulants: Secondary | ICD-10-CM | POA: Diagnosis not present

## 2019-12-20 DIAGNOSIS — Z5181 Encounter for therapeutic drug level monitoring: Secondary | ICD-10-CM | POA: Diagnosis not present

## 2019-12-20 DIAGNOSIS — I513 Intracardiac thrombosis, not elsewhere classified: Secondary | ICD-10-CM

## 2019-12-20 LAB — POCT INR: INR: 5.3 — AB (ref 2.0–3.0)

## 2019-12-20 NOTE — Telephone Encounter (Signed)
There is a company calling to see if we have received a fax for this patient for a CMN -- certified [employer]cess[employer]. I checked Jordan's and Quaneisha's desk and didn't see any form so I had the company to fax the form again.

## 2019-12-20 NOTE — Patient Instructions (Signed)
INR was above goal today. HOLD warfarin for 4 days. Recheck INR in 4 days.

## 2019-12-20 NOTE — Telephone Encounter (Signed)
I have received the fax and placed it in Dr. Doug Sou red folder up front.

## 2019-12-20 NOTE — Progress Notes (Signed)
Anticoagulation Management Brittany Roberts is a 79 y.o. female who reports to the clinic for monitoring of warfarin treatment.    Indication: LV Thrombus  Duration: 3 months (expected end date 03/2020) Supervising physician: [facility] Visit History:  Patient does not report signs/symptoms of bleeding or thromboembolism  Other recent changes: No change in diet, medications, lifestyle. Pt initially treated with heparin and then transitioned to eliquis on 12/06/19. Pt started on loading dose eliquis and then 5 mg BID. Per Dr. Bonney Roussel note on 6/10: "given her increasing creatinine, I am concerned about her bleeding risk. 2.5 mg twice daily dose would not be adequate for treatment of presence of LV thrombus. Therefore, I have decided to switch her to warfarin daily". Pt was started warfarin and instructed to continue her eliquis for 3 days before stopping eliquis and continuing daily warfarin.   Recent worsening of pt's renal function. SCr increased to 2.9 (previously 1.9) and eGFR of 24 (previously 35). Nephrology recommended pt to decrease Torsemide and amlodipine dose. Allopurinol dose increased.   Anticoagulation Episode Summary    Current INR goal:  2.0-3.0  TTR:  --  Next INR check:  12/24/2019  INR from last check:  5.3 (12/20/2019)  Weekly max warfarin dose:    Target end date:  03/14/2020  INR check location:    Preferred lab:    Send INR reminders to:     Indications   LV (left ventricular) mural thrombus [I51.3] Monitoring for long-term anticoagulant use [Z51.81 Z79.01]       Comments:        Anticoagulation Care Providers    Provider Role Specialty Phone number   Nigel Mormon, MD  Cardiology [phone]      Allergies  Allergen Reactions  . Ace Inhibitors Other (See Comments)    Coincided with sig bump in creat. Retried and creat bumped again.   . Isosorb Dinitrate-Hydralazine Other (See Comments)    Sleep all the time.   Marland Kitchen  Penicillins Anaphylaxis, Swelling and Rash    Has patient had a PCN reaction causing immediate rash, facial/tongue/throat swelling, SOB or lightheadedness with hypotension: Yes Has patient had a PCN reaction causing severe rash involving mucus membranes or skin necrosis: Yes Has patient had a PCN reaction that required hospitalization: Yes Has patient had a PCN reaction occurring within the last 10 years: No If all of the above answers are "NO", then may proceed with Cephalosporin use.   Marland Kitchen Buspirone Other (See Comments)    pain   . Pregabalin Swelling  . Ropinirole Hydrochloride Swelling  . Amantadines Rash    "Swelling of the tongue"    Current Outpatient Medications:  .  Accu-Chek Softclix Lancets lancets, Use as instructed, Disp: 100 each, Rfl: 3 .  acetaminophen (TYLENOL) 500 MG tablet, Take 500 mg by mouth every 6 (six) hours as needed for moderate pain., Disp: , Rfl:  .  allopurinol (ZYLOPRIM) 100 MG tablet, Take 1 tablet (100 mg total) by mouth daily. (Patient taking differently: Take 200 mg by mouth daily. ), Disp: 90 tablet, Rfl: 2 .  amLODipine (NORVASC) 5 MG tablet, Take 1 tablet (5 mg total) by mouth daily. (Patient taking differently: Take 2.5 mg by mouth daily. ), Disp: 90 tablet, Rfl: 3 .  Ascorbic Acid (VITAMIN C PO), Take 1 tablet by mouth daily., Disp: , Rfl:  .  atorvastatin (LIPITOR) 20 MG tablet, TAKE 1 TABLET(20 MG) BY MOUTH DAILY, Disp: 90 tablet, Rfl: 0 .  B Complex Vitamins (VITAMIN B COMPLEX) TABS, Take 1 tablet by mouth daily., Disp: , Rfl:  .  budesonide-formoterol (SYMBICORT) 80-4.5 MCG/ACT inhaler, Inhale 2 puffs into the lungs 2 (two) times daily., Disp: 1 Inhaler, Rfl: 3 .  Cholecalciferol (VITAMIN D3) 5000 UNITS CAPS, Take 5,000 Units by mouth daily., Disp: , Rfl:  .  DULoxetine (CYMBALTA) 60 MG capsule, Take 1 capsule (60 mg total) by mouth daily., Disp: 90 capsule, Rfl: 2 .  ferrous sulfate 325 (65 FE) MG tablet, Take 325 mg by mouth daily with  breakfast., Disp: , Rfl:  .  Fluocinolone Acetonide Scalp 0.01 % OIL, Apply 1 application topically at bedtime as needed (itching scalp). , Disp: , Rfl:  .  gabapentin (NEURONTIN) 100 MG capsule, Take 1 capsule (100 mg total) by mouth 2 (two) times daily., Disp:  , Rfl:  .  glucose blood (ACCU-CHEK AVIVA PLUS) test strip, Use to test blood sugar three times daily, Disp: 100 each, Rfl: 3 .  guaiFENesin-dextromethorphan (ROBITUSSIN DM) 100-10 MG/5ML syrup, Take 5 mLs by mouth every 4 (four) hours as needed for cough., Disp: 118 mL, Rfl: 0 .  Insulin NPH, Human,, Isophane, (HUMULIN N KWIKPEN) 100 UNIT/ML Kiwkpen, 10 Units am and 10 Units pm, Disp: , Rfl:  .  Insulin Pen Needle (B-D ULTRAFINE III SHORT PEN) 31G X 8 MM MISC, CHECK BLOOD SUGAR 3 TIMES A DAY, Disp: 100 each, Rfl: 3 .  isosorbide mononitrate (IMDUR) 30 MG 24 hr tablet, Take 1 tablet (30 mg total) by mouth daily., Disp: 90 tablet, Rfl: 3 .  levETIRAcetam (KEPPRA) 500 MG tablet, Take 1 tablet (500 mg total) by mouth 2 (two) times daily., Disp: 180 tablet, Rfl: 3 .  linaclotide (LINZESS) 145 MCG CAPS capsule, Take 1 capsule (145 mcg total) by mouth daily as needed (abdominal cramping)., Disp: , Rfl:  .  memantine (NAMENDA) 10 MG tablet, Take 1 tablet (10 mg total) by mouth 2 (two) times daily., Disp: 60 tablet, Rfl: 11 .  metoprolol succinate (TOPROL-XL) 50 MG 24 hr tablet, TAKE 1 TABLET BY MOUTH EVERY DAY. TAKE WITH OR IMMEDIATELY FOLLOWING A MEAL, Disp: 90 tablet, Rfl: 1 .  montelukast (SINGULAIR) 10 MG tablet, Take 1 tablet (10 mg total) by mouth at bedtime., Disp: 90 tablet, Rfl: 3 .  Omega-3 Fatty Acids (FISH OIL) 1000 MG CAPS, Take 1,000 mg by mouth daily. , Disp: , Rfl:  .  pantoprazole (PROTONIX) 40 MG tablet, TAKE 1 TABLET(40 MG) BY MOUTH DAILY (Patient taking differently: Take 40 mg by mouth daily. ), Disp: 90 tablet, Rfl: 3 .  Polyethyl Glycol-Propyl Glycol (SYSTANE) 0.4-0.3 % GEL ophthalmic gel, Place 1 application into both eyes  every 4 (four) hours as needed (dry eyes)., Disp: , Rfl:  .  PROAIR HFA 108 (90 Base) MCG/ACT inhaler, INHALE 2 PUFF BY MOUTH EVERY 4 HOURS AS NEEDED USE ONLY IF YOU ARE WHEEZING (Patient taking differently: Inhale 2 puffs into the lungs every 4 (four) hours as needed for wheezing. ), Disp: 8.5 g, Rfl: 3 .  Spacer/Aero-Holding Chambers (AEROCHAMBER PLUS) inhaler, Use as instructed to use with inahaler., Disp: 1 each, Rfl: 1 .  torsemide (DEMADEX) 20 MG tablet, Take 1 tablet (20 mg total) by mouth 2 (two) times daily. (Patient taking differently: Take 20 mg by mouth daily. ), Disp: 60 tablet, Rfl: 0 .  warfarin (COUMADIN) 5 MG tablet, Take 1.5 tablets (7.5 mg total) by mouth daily., Disp: 45 tablet, Rfl: 3 Past Medical History:  Diagnosis  Date  . Acute delirium 01/14/2017  . Anxiety   . Arthritis    "all over"  . Asthma   . Bipolar disorder ([place])   . Complication of anesthesia    "w/right foot OR they gave me too much and they couldn't get me woke"  . Congestive heart failure, unspecified   . Coronary artery calcification seen on CAT scan 01/14/2017  . Coronary artery disease    "I've got 1 stent" (06/30/2015)  . Depression   . Discoid lupus   . Fibromyalgia    "I've been told I have this; can't take Lyrica cause I'm allergic to it" (06/30/2015)  . GERD (gastroesophageal reflux disease)   . History of gout   . History of hiatal hernia    "real bad" (06/30/2015)  . Hypertension   . Hypothyroidism   . Memory loss   . On home oxygen therapy    "2L q hs" (06/30/2015)  . OSA (obstructive sleep apnea)    "just use my oxygen; no mask" (06/30/2015)  . Other and unspecified hyperlipidemia   . Penetrating foot wound    left nonhealing foot wound on the dorsal surface  . Peripheral neuropathy   . Pneumonia "many of times"  . Refusal of blood transfusions as patient is Jehovah's Witness   . Renal failure, unspecified    "kidneys not working 100%" (06/30/2015)  . Seizure ([place])   . Sickle  cell trait ([place])   . Systemic lupus ([place])   . Thoracic aortic atherosclerosis ([place]) 01/14/2017  . Type II diabetes mellitus ([place])   . Unspecified vitamin D deficiency     ASSESSMENT  Recent Results: The most recent result is correlated with 30 mg per week:  Lab Results  Component Value Date   INR 5.3 (A) 12/20/2019   INR 6.0 (A) 12/17/2019   INR 1.0 11/01/2019    Anticoagulation Dosing: Description   INR was above goal today. HOLD warfarin for 4 days. Recheck INR in 4 days.       INR today: Supratherapeutic. New start warfarin. Pt transitioned from eliquis to warfarin on higher daily warfarin to limit the need for LMWH in setting of poor renal function. Started on high dose with the expectation of pt being INR>2 quicker and to reduce risk of thromboembolic event during the transition. Pt noted to have patient related factors that would make her more sensitive to warfarin (Hx of CHF, poor renal function, frail and elderly).   Pt denies any current bleeding or bruising symptoms. Pt would likely need a much lower weekly dose. Initially started on 7.5 mg weekly. INR trending down slowly, but still remains elevated. Recent allopurinol dose increase may also be playing a role in the elevated INR reading due to potential DDI between warfarin and allopurinol. Pt aware to monitor for s/sx of bleeding. Will continue to hold till INR gets closer to therapeutic range. Will continue close monitoring and follow up to ensure pt remains within therapeutic range.   PLAN  Weekly dose was deferred at this time. Will have pt hold warfarin dose for 4 days.   Patient Instructions  INR was above goal today. HOLD warfarin for 4 days. Recheck INR in 4 days.   Patient advised to contact clinic or seek medical attention if signs/symptoms of bleeding or thromboembolism occur.  Patient verbalized understanding by repeating back information and was advised to contact me if further medication-related questions  arise.   Follow-up Return in about 4 days (around 12/24/2019).  Alysia Penna, PharmD  15 minutes spent face-to-face with the patient during the encounter. 50% of time spent on education, including signs/sx bleeding and clotting, as well as food and drug interactions with warfarin. 50% of time was spent on fingerprick POC INR sample collection,processing, results determination, and documentation

## 2019-12-21 DIAGNOSIS — N184 Chronic kidney disease, stage 4 (severe): Secondary | ICD-10-CM | POA: Diagnosis not present

## 2019-12-21 DIAGNOSIS — E039 Hypothyroidism, unspecified: Secondary | ICD-10-CM | POA: Diagnosis not present

## 2019-12-21 DIAGNOSIS — D509 Iron deficiency anemia, unspecified: Secondary | ICD-10-CM | POA: Diagnosis not present

## 2019-12-21 DIAGNOSIS — I700 Atherosclerosis of aorta: Secondary | ICD-10-CM | POA: Diagnosis not present

## 2019-12-21 DIAGNOSIS — E1142 Type 2 diabetes mellitus with diabetic polyneuropathy: Secondary | ICD-10-CM | POA: Diagnosis not present

## 2019-12-21 DIAGNOSIS — J9811 Atelectasis: Secondary | ICD-10-CM | POA: Diagnosis not present

## 2019-12-21 DIAGNOSIS — I5043 Acute on chronic combined systolic (congestive) and diastolic (congestive) heart failure: Secondary | ICD-10-CM | POA: Diagnosis not present

## 2019-12-21 DIAGNOSIS — I130 Hypertensive heart and chronic kidney disease with heart failure and stage 1 through stage 4 chronic kidney disease, or unspecified chronic kidney disease: Secondary | ICD-10-CM | POA: Diagnosis not present

## 2019-12-21 DIAGNOSIS — I472 Ventricular tachycardia: Secondary | ICD-10-CM | POA: Diagnosis not present

## 2019-12-21 DIAGNOSIS — I352 Nonrheumatic aortic (valve) stenosis with insufficiency: Secondary | ICD-10-CM | POA: Diagnosis not present

## 2019-12-21 DIAGNOSIS — E1165 Type 2 diabetes mellitus with hyperglycemia: Secondary | ICD-10-CM | POA: Diagnosis not present

## 2019-12-21 DIAGNOSIS — D631 Anemia in chronic kidney disease: Secondary | ICD-10-CM | POA: Diagnosis not present

## 2019-12-21 DIAGNOSIS — M797 Fibromyalgia: Secondary | ICD-10-CM | POA: Diagnosis not present

## 2019-12-21 DIAGNOSIS — E1122 Type 2 diabetes mellitus with diabetic chronic kidney disease: Secondary | ICD-10-CM | POA: Diagnosis not present

## 2019-12-21 DIAGNOSIS — I2510 Atherosclerotic heart disease of native coronary artery without angina pectoris: Secondary | ICD-10-CM | POA: Diagnosis not present

## 2019-12-21 DIAGNOSIS — D630 Anemia in neoplastic disease: Secondary | ICD-10-CM | POA: Diagnosis not present

## 2019-12-21 DIAGNOSIS — M1030 Gout due to renal impairment, unspecified site: Secondary | ICD-10-CM | POA: Diagnosis not present

## 2019-12-21 DIAGNOSIS — M150 Primary generalized (osteo)arthritis: Secondary | ICD-10-CM | POA: Diagnosis not present

## 2019-12-21 DIAGNOSIS — J9621 Acute and chronic respiratory failure with hypoxia: Secondary | ICD-10-CM | POA: Diagnosis not present

## 2019-12-24 ENCOUNTER — Ambulatory Visit: Payer: Medicare Other | Admitting: Pharmacist

## 2019-12-24 ENCOUNTER — Other Ambulatory Visit: Payer: Self-pay

## 2019-12-24 DIAGNOSIS — Z5181 Encounter for therapeutic drug level monitoring: Secondary | ICD-10-CM | POA: Diagnosis not present

## 2019-12-24 DIAGNOSIS — Z7901 Long term (current) use of anticoagulants: Secondary | ICD-10-CM | POA: Diagnosis not present

## 2019-12-24 DIAGNOSIS — I513 Intracardiac thrombosis, not elsewhere classified: Secondary | ICD-10-CM | POA: Diagnosis not present

## 2019-12-24 LAB — POCT INR: INR: 2.8 (ref 2.0–3.0)

## 2019-12-24 NOTE — Progress Notes (Signed)
Anticoagulation Management Brittany Roberts is a 79 y.o. female who reports to the clinic for monitoring of warfarin treatment.    Indication: LV Thrombus  Duration: 3 months (expected end date 03/2020) Supervising physician: [facility] Visit History:  Patient does not report signs/symptoms of bleeding or thromboembolism. Did notice mild nose bleed while blowing her nose x1. Reports notices small amount of blood in her tissue only. No s/sx of epistaxis  Other recent changes: No change in diet, medications, lifestyle. Pt initially treated with heparin and then transitioned to eliquis on 12/06/19. Pt started on loading dose eliquis and then 5 mg BID. Per Dr. Bonney Roussel note on 6/10: "given her increasing creatinine, I am concerned about her bleeding risk. 2.5 mg twice daily dose would not be adequate for treatment of presence of LV thrombus. Therefore, I have decided to switch her to warfarin daily". Pt was started warfarin and instructed to continue her eliquis for 3 days before stopping eliquis and continuing daily warfarin.   Recent complain of GERD and nausea. Denies any recent emesis episode.   Anticoagulation Episode Summary    Current INR goal:  2.0-3.0  TTR:  -  Next INR check:  12/31/2019  INR from last check:  2.8 (12/24/2019)  Weekly max warfarin dose:    Target end date:  03/14/2020  INR check location:    Preferred lab:    Send INR reminders to:     Indications   LV (left ventricular) mural thrombus [I51.3] Monitoring for long-term anticoagulant use [Z51.81 Z79.01]       Comments:        Anticoagulation Care Providers    Provider Role Specialty Phone number   Nigel Mormon, MD  Cardiology [phone]      Allergies  Allergen Reactions  . Ace Inhibitors Other (See Comments)    Coincided with sig bump in creat. Retried and creat bumped again.   . Isosorb Dinitrate-Hydralazine Other (See Comments)    Sleep all the time.   Marland Kitchen  Penicillins Anaphylaxis, Swelling and Rash    Has patient had a PCN reaction causing immediate rash, facial/tongue/throat swelling, SOB or lightheadedness with hypotension: Yes Has patient had a PCN reaction causing severe rash involving mucus membranes or skin necrosis: Yes Has patient had a PCN reaction that required hospitalization: Yes Has patient had a PCN reaction occurring within the last 10 years: No If all of the above answers are "NO", then may proceed with Cephalosporin use.   Marland Kitchen Buspirone Other (See Comments)    pain   . Pregabalin Swelling  . Ropinirole Hydrochloride Swelling  . Amantadines Rash    "Swelling of the tongue"    Current Outpatient Medications:  .  Accu-Chek Softclix Lancets lancets, Use as instructed, Disp: 100 each, Rfl: 3 .  acetaminophen (TYLENOL) 500 MG tablet, Take 500 mg by mouth every 6 (six) hours as needed for moderate pain., Disp: , Rfl:  .  allopurinol (ZYLOPRIM) 100 MG tablet, Take 1 tablet (100 mg total) by mouth daily. (Patient taking differently: Take 200 mg by mouth daily. ), Disp: 90 tablet, Rfl: 2 .  amLODipine (NORVASC) 5 MG tablet, Take 1 tablet (5 mg total) by mouth daily. (Patient taking differently: Take 2.5 mg by mouth daily. ), Disp: 90 tablet, Rfl: 3 .  Ascorbic Acid (VITAMIN C PO), Take 1 tablet by mouth daily., Disp: , Rfl:  .  atorvastatin (LIPITOR) 20 MG tablet, TAKE 1 TABLET(20 MG) BY MOUTH DAILY, Disp: 90  tablet, Rfl: 0 .  B Complex Vitamins (VITAMIN B COMPLEX) TABS, Take 1 tablet by mouth daily., Disp: , Rfl:  .  budesonide-formoterol (SYMBICORT) 80-4.5 MCG/ACT inhaler, Inhale 2 puffs into the lungs 2 (two) times daily., Disp: 1 Inhaler, Rfl: 3 .  Cholecalciferol (VITAMIN D3) 5000 UNITS CAPS, Take 5,000 Units by mouth daily., Disp: , Rfl:  .  DULoxetine (CYMBALTA) 60 MG capsule, Take 1 capsule (60 mg total) by mouth daily., Disp: 90 capsule, Rfl: 2 .  ferrous sulfate 325 (65 FE) MG tablet, Take 325 mg by mouth daily with  breakfast., Disp: , Rfl:  .  Fluocinolone Acetonide Scalp 0.01 % OIL, Apply 1 application topically at bedtime as needed (itching scalp). , Disp: , Rfl:  .  gabapentin (NEURONTIN) 100 MG capsule, Take 1 capsule (100 mg total) by mouth 2 (two) times daily., Disp:  , Rfl:  .  glucose blood (ACCU-CHEK AVIVA PLUS) test strip, Use to test blood sugar three times daily, Disp: 100 each, Rfl: 3 .  guaiFENesin-dextromethorphan (ROBITUSSIN DM) 100-10 MG/5ML syrup, Take 5 mLs by mouth every 4 (four) hours as needed for cough., Disp: 118 mL, Rfl: 0 .  Insulin NPH, Human,, Isophane, (HUMULIN N KWIKPEN) 100 UNIT/ML Kiwkpen, 10 Units am and 10 Units pm, Disp: , Rfl:  .  Insulin Pen Needle (B-D ULTRAFINE III SHORT PEN) 31G X 8 MM MISC, CHECK BLOOD SUGAR 3 TIMES A DAY, Disp: 100 each, Rfl: 3 .  isosorbide mononitrate (IMDUR) 30 MG 24 hr tablet, Take 1 tablet (30 mg total) by mouth daily., Disp: 90 tablet, Rfl: 3 .  levETIRAcetam (KEPPRA) 500 MG tablet, Take 1 tablet (500 mg total) by mouth 2 (two) times daily., Disp: 180 tablet, Rfl: 3 .  linaclotide (LINZESS) 145 MCG CAPS capsule, Take 1 capsule (145 mcg total) by mouth daily as needed (abdominal cramping)., Disp: , Rfl:  .  memantine (NAMENDA) 10 MG tablet, Take 1 tablet (10 mg total) by mouth 2 (two) times daily., Disp: 60 tablet, Rfl: 11 .  metoprolol succinate (TOPROL-XL) 50 MG 24 hr tablet, TAKE 1 TABLET BY MOUTH EVERY DAY. TAKE WITH OR IMMEDIATELY FOLLOWING A MEAL, Disp: 90 tablet, Rfl: 1 .  montelukast (SINGULAIR) 10 MG tablet, Take 1 tablet (10 mg total) by mouth at bedtime., Disp: 90 tablet, Rfl: 3 .  Omega-3 Fatty Acids (FISH OIL) 1000 MG CAPS, Take 1,000 mg by mouth daily. , Disp: , Rfl:  .  pantoprazole (PROTONIX) 40 MG tablet, TAKE 1 TABLET(40 MG) BY MOUTH DAILY (Patient taking differently: Take 40 mg by mouth daily. ), Disp: 90 tablet, Rfl: 3 .  Polyethyl Glycol-Propyl Glycol (SYSTANE) 0.4-0.3 % GEL ophthalmic gel, Place 1 application into both eyes  every 4 (four) hours as needed (dry eyes)., Disp: , Rfl:  .  PROAIR HFA 108 (90 Base) MCG/ACT inhaler, INHALE 2 PUFF BY MOUTH EVERY 4 HOURS AS NEEDED USE ONLY IF YOU ARE WHEEZING (Patient taking differently: Inhale 2 puffs into the lungs every 4 (four) hours as needed for wheezing. ), Disp: 8.5 g, Rfl: 3 .  Spacer/Aero-Holding Chambers (AEROCHAMBER PLUS) inhaler, Use as instructed to use with inahaler., Disp: 1 each, Rfl: 1 .  torsemide (DEMADEX) 20 MG tablet, Take 1 tablet (20 mg total) by mouth 2 (two) times daily. (Patient taking differently: Take 20 mg by mouth daily. ), Disp: 60 tablet, Rfl: 0 .  warfarin (COUMADIN) 5 MG tablet, Take 1.5 tablets (7.5 mg total) by mouth daily., Disp: 45 tablet, Rfl: 3  Past Medical History:  Diagnosis Date  . Acute delirium 01/14/2017  . Anxiety   . Arthritis    "all over"  . Asthma   . Bipolar disorder ([place])   . Complication of anesthesia    "w/right foot OR they gave me too much and they couldn't get me woke"  . Congestive heart failure, unspecified   . Coronary artery calcification seen on CAT scan 01/14/2017  . Coronary artery disease    "I've got 1 stent" (06/30/2015)  . Depression   . Discoid lupus   . Fibromyalgia    "I've been told I have this; can't take Lyrica cause I'm allergic to it" (06/30/2015)  . GERD (gastroesophageal reflux disease)   . History of gout   . History of hiatal hernia    "real bad" (06/30/2015)  . Hypertension   . Hypothyroidism   . Memory loss   . On home oxygen therapy    "2L q hs" (06/30/2015)  . OSA (obstructive sleep apnea)    "just use my oxygen; no mask" (06/30/2015)  . Other and unspecified hyperlipidemia   . Penetrating foot wound    left nonhealing foot wound on the dorsal surface  . Peripheral neuropathy   . Pneumonia "many of times"  . Refusal of blood transfusions as patient is Jehovah's Witness   . Renal failure, unspecified    "kidneys not working 100%" (06/30/2015)  . Seizure ([place])   . Sickle  cell trait ([place])   . Systemic lupus ([place])   . Thoracic aortic atherosclerosis ([place]) 01/14/2017  . Type II diabetes mellitus ([place])   . Unspecified vitamin D deficiency     ASSESSMENT  Recent Results: The most recent result is correlated with 0 mg per week:  Lab Results  Component Value Date   INR 2.8 12/24/2019   INR 5.3 (A) 12/20/2019   INR 6.0 (A) 12/17/2019    Anticoagulation Dosing: Description   INR at today. Start taking warfarin 2.5 mg daily. Recheck INR in 1 week      INR today: Therapeutic. INR continues to trend down after continued held dose. Slight episode of epistaxis. Will retry low dose warfarin maintenance dose of 2.5 mg daily and continue close monitoring to ensure INR continues to remain therapeutic.   Pt noted to have patient related factors that would make her more sensitive to warfarin (Hx of CHF, poor renal function, frail and elderly).    PLAN  Weekly dose was decreased to 2.5 mg daily. Recheck INR in 1 week  Patient Instructions  INR at today. Start taking warfarin 2.5 mg daily. Recheck INR in 1 week  Patient advised to contact clinic or seek medical attention if signs/symptoms of bleeding or thromboembolism occur.  Patient verbalized understanding by repeating back information and was advised to contact me if further medication-related questions arise.   Follow-up Return in about 1 week (around 12/31/2019).  Alysia Penna, PharmD  15 minutes spent face-to-face with the patient during the encounter. 50% of time spent on education, including signs/sx bleeding and clotting, as well as food and drug interactions with warfarin. 50% of time was spent on fingerprick POC INR sample collection,processing, results determination, and documentation

## 2019-12-24 NOTE — Patient Instructions (Signed)
INR at today. Start taking warfarin 2.5 mg daily. Recheck INR in 1 week

## 2019-12-24 NOTE — Telephone Encounter (Signed)
Form completed and faxed back

## 2019-12-25 DIAGNOSIS — M1030 Gout due to renal impairment, unspecified site: Secondary | ICD-10-CM | POA: Diagnosis not present

## 2019-12-25 DIAGNOSIS — D509 Iron deficiency anemia, unspecified: Secondary | ICD-10-CM | POA: Diagnosis not present

## 2019-12-25 DIAGNOSIS — I700 Atherosclerosis of aorta: Secondary | ICD-10-CM | POA: Diagnosis not present

## 2019-12-25 DIAGNOSIS — E039 Hypothyroidism, unspecified: Secondary | ICD-10-CM | POA: Diagnosis not present

## 2019-12-25 DIAGNOSIS — N184 Chronic kidney disease, stage 4 (severe): Secondary | ICD-10-CM | POA: Diagnosis not present

## 2019-12-25 DIAGNOSIS — E1122 Type 2 diabetes mellitus with diabetic chronic kidney disease: Secondary | ICD-10-CM | POA: Diagnosis not present

## 2019-12-25 DIAGNOSIS — D631 Anemia in chronic kidney disease: Secondary | ICD-10-CM | POA: Diagnosis not present

## 2019-12-25 DIAGNOSIS — M150 Primary generalized (osteo)arthritis: Secondary | ICD-10-CM | POA: Diagnosis not present

## 2019-12-25 DIAGNOSIS — M797 Fibromyalgia: Secondary | ICD-10-CM | POA: Diagnosis not present

## 2019-12-25 DIAGNOSIS — J9621 Acute and chronic respiratory failure with hypoxia: Secondary | ICD-10-CM | POA: Diagnosis not present

## 2019-12-25 DIAGNOSIS — I130 Hypertensive heart and chronic kidney disease with heart failure and stage 1 through stage 4 chronic kidney disease, or unspecified chronic kidney disease: Secondary | ICD-10-CM | POA: Diagnosis not present

## 2019-12-25 DIAGNOSIS — I472 Ventricular tachycardia: Secondary | ICD-10-CM | POA: Diagnosis not present

## 2019-12-25 DIAGNOSIS — D630 Anemia in neoplastic disease: Secondary | ICD-10-CM | POA: Diagnosis not present

## 2019-12-25 DIAGNOSIS — I2510 Atherosclerotic heart disease of native coronary artery without angina pectoris: Secondary | ICD-10-CM | POA: Diagnosis not present

## 2019-12-25 DIAGNOSIS — J9811 Atelectasis: Secondary | ICD-10-CM | POA: Diagnosis not present

## 2019-12-25 DIAGNOSIS — I5043 Acute on chronic combined systolic (congestive) and diastolic (congestive) heart failure: Secondary | ICD-10-CM | POA: Diagnosis not present

## 2019-12-25 DIAGNOSIS — I352 Nonrheumatic aortic (valve) stenosis with insufficiency: Secondary | ICD-10-CM | POA: Diagnosis not present

## 2019-12-25 DIAGNOSIS — E1165 Type 2 diabetes mellitus with hyperglycemia: Secondary | ICD-10-CM | POA: Diagnosis not present

## 2019-12-25 DIAGNOSIS — E1142 Type 2 diabetes mellitus with diabetic polyneuropathy: Secondary | ICD-10-CM | POA: Diagnosis not present

## 2019-12-26 DIAGNOSIS — I700 Atherosclerosis of aorta: Secondary | ICD-10-CM | POA: Diagnosis not present

## 2019-12-26 DIAGNOSIS — N184 Chronic kidney disease, stage 4 (severe): Secondary | ICD-10-CM | POA: Diagnosis not present

## 2019-12-26 DIAGNOSIS — I5043 Acute on chronic combined systolic (congestive) and diastolic (congestive) heart failure: Secondary | ICD-10-CM | POA: Diagnosis not present

## 2019-12-26 DIAGNOSIS — E039 Hypothyroidism, unspecified: Secondary | ICD-10-CM | POA: Diagnosis not present

## 2019-12-26 DIAGNOSIS — M150 Primary generalized (osteo)arthritis: Secondary | ICD-10-CM | POA: Diagnosis not present

## 2019-12-26 DIAGNOSIS — I130 Hypertensive heart and chronic kidney disease with heart failure and stage 1 through stage 4 chronic kidney disease, or unspecified chronic kidney disease: Secondary | ICD-10-CM | POA: Diagnosis not present

## 2019-12-26 DIAGNOSIS — D509 Iron deficiency anemia, unspecified: Secondary | ICD-10-CM | POA: Diagnosis not present

## 2019-12-26 DIAGNOSIS — E1165 Type 2 diabetes mellitus with hyperglycemia: Secondary | ICD-10-CM | POA: Diagnosis not present

## 2019-12-26 DIAGNOSIS — M1030 Gout due to renal impairment, unspecified site: Secondary | ICD-10-CM | POA: Diagnosis not present

## 2019-12-26 DIAGNOSIS — I2510 Atherosclerotic heart disease of native coronary artery without angina pectoris: Secondary | ICD-10-CM | POA: Diagnosis not present

## 2019-12-26 DIAGNOSIS — E1122 Type 2 diabetes mellitus with diabetic chronic kidney disease: Secondary | ICD-10-CM | POA: Diagnosis not present

## 2019-12-26 DIAGNOSIS — J9621 Acute and chronic respiratory failure with hypoxia: Secondary | ICD-10-CM | POA: Diagnosis not present

## 2019-12-26 DIAGNOSIS — E1142 Type 2 diabetes mellitus with diabetic polyneuropathy: Secondary | ICD-10-CM | POA: Diagnosis not present

## 2019-12-26 DIAGNOSIS — D631 Anemia in chronic kidney disease: Secondary | ICD-10-CM | POA: Diagnosis not present

## 2019-12-26 DIAGNOSIS — M797 Fibromyalgia: Secondary | ICD-10-CM | POA: Diagnosis not present

## 2019-12-26 DIAGNOSIS — I472 Ventricular tachycardia: Secondary | ICD-10-CM | POA: Diagnosis not present

## 2019-12-26 DIAGNOSIS — I352 Nonrheumatic aortic (valve) stenosis with insufficiency: Secondary | ICD-10-CM | POA: Diagnosis not present

## 2019-12-26 DIAGNOSIS — D630 Anemia in neoplastic disease: Secondary | ICD-10-CM | POA: Diagnosis not present

## 2019-12-26 DIAGNOSIS — J9811 Atelectasis: Secondary | ICD-10-CM | POA: Diagnosis not present

## 2019-12-27 ENCOUNTER — Other Ambulatory Visit: Payer: Self-pay | Admitting: Family Medicine

## 2019-12-28 ENCOUNTER — Telehealth: Payer: Self-pay | Admitting: Family Medicine

## 2019-12-28 NOTE — Telephone Encounter (Signed)
If it was small amount I do not think it is not very concerning at this time. It could have been hemorrhoids or bleeding associated with staining. [She had colonoscopy on 12/10/2013: Polypectomy x1, otherwise normal colonoscopy.] Pathology showed hypertrophic polyp, no precancer lesion.  If she has another episode and more blood that this time, she will need to go to the ER.  If problem become recurrent, we may need to arrange appt with GI to discuss possibility of colonoscopy (which can be risky at her age given her chronic medical problems). Avoid constipation/straining. Adequate fiber and water intake. Thanks, BJ

## 2019-12-28 NOTE — Telephone Encounter (Signed)
FYI

## 2019-12-28 NOTE — Telephone Encounter (Signed)
The patient called to let Dr. [name] know that she had a Bowel movement and she saw blood. She was told to let [name] know if she saw any blood to let her know. I was going to send a telephone note to Dr. Doug Sou nurse and the patient insisted speaking with the nurse because she can be dead by then waiting on the nurse to call her back.  Please advise

## 2019-12-28 NOTE — Telephone Encounter (Signed)
[name] was made aware of Dr.Jordans note below and verbalized understanding. She stated that she will call pt to update her on what is going on. I advised [name] that per Dr.Jo0rdan if pt has any concern she can call to schedule a in office or VV.

## 2019-12-28 NOTE — Telephone Encounter (Signed)
Please advise 

## 2019-12-31 ENCOUNTER — Ambulatory Visit: Payer: Medicare Other | Admitting: Pharmacist

## 2019-12-31 ENCOUNTER — Other Ambulatory Visit: Payer: Self-pay

## 2019-12-31 ENCOUNTER — Other Ambulatory Visit: Payer: Self-pay | Admitting: Pharmacist

## 2019-12-31 DIAGNOSIS — D509 Iron deficiency anemia, unspecified: Secondary | ICD-10-CM | POA: Diagnosis not present

## 2019-12-31 DIAGNOSIS — Z5181 Encounter for therapeutic drug level monitoring: Secondary | ICD-10-CM

## 2019-12-31 DIAGNOSIS — D630 Anemia in neoplastic disease: Secondary | ICD-10-CM | POA: Diagnosis not present

## 2019-12-31 DIAGNOSIS — M1030 Gout due to renal impairment, unspecified site: Secondary | ICD-10-CM | POA: Diagnosis not present

## 2019-12-31 DIAGNOSIS — E1165 Type 2 diabetes mellitus with hyperglycemia: Secondary | ICD-10-CM | POA: Diagnosis not present

## 2019-12-31 DIAGNOSIS — I472 Ventricular tachycardia: Secondary | ICD-10-CM | POA: Diagnosis not present

## 2019-12-31 DIAGNOSIS — Z7901 Long term (current) use of anticoagulants: Secondary | ICD-10-CM | POA: Diagnosis not present

## 2019-12-31 DIAGNOSIS — M797 Fibromyalgia: Secondary | ICD-10-CM | POA: Diagnosis not present

## 2019-12-31 DIAGNOSIS — I130 Hypertensive heart and chronic kidney disease with heart failure and stage 1 through stage 4 chronic kidney disease, or unspecified chronic kidney disease: Secondary | ICD-10-CM | POA: Diagnosis not present

## 2019-12-31 DIAGNOSIS — I700 Atherosclerosis of aorta: Secondary | ICD-10-CM | POA: Diagnosis not present

## 2019-12-31 DIAGNOSIS — I5043 Acute on chronic combined systolic (congestive) and diastolic (congestive) heart failure: Secondary | ICD-10-CM | POA: Diagnosis not present

## 2019-12-31 DIAGNOSIS — J9811 Atelectasis: Secondary | ICD-10-CM | POA: Diagnosis not present

## 2019-12-31 DIAGNOSIS — M109 Gout, unspecified: Secondary | ICD-10-CM

## 2019-12-31 DIAGNOSIS — I352 Nonrheumatic aortic (valve) stenosis with insufficiency: Secondary | ICD-10-CM | POA: Diagnosis not present

## 2019-12-31 DIAGNOSIS — E1142 Type 2 diabetes mellitus with diabetic polyneuropathy: Secondary | ICD-10-CM | POA: Diagnosis not present

## 2019-12-31 DIAGNOSIS — I513 Intracardiac thrombosis, not elsewhere classified: Secondary | ICD-10-CM

## 2019-12-31 DIAGNOSIS — J9621 Acute and chronic respiratory failure with hypoxia: Secondary | ICD-10-CM | POA: Diagnosis not present

## 2019-12-31 DIAGNOSIS — M150 Primary generalized (osteo)arthritis: Secondary | ICD-10-CM | POA: Diagnosis not present

## 2019-12-31 DIAGNOSIS — I2510 Atherosclerotic heart disease of native coronary artery without angina pectoris: Secondary | ICD-10-CM | POA: Diagnosis not present

## 2019-12-31 DIAGNOSIS — D631 Anemia in chronic kidney disease: Secondary | ICD-10-CM | POA: Diagnosis not present

## 2019-12-31 DIAGNOSIS — N184 Chronic kidney disease, stage 4 (severe): Secondary | ICD-10-CM | POA: Diagnosis not present

## 2019-12-31 DIAGNOSIS — E039 Hypothyroidism, unspecified: Secondary | ICD-10-CM | POA: Diagnosis not present

## 2019-12-31 DIAGNOSIS — E1122 Type 2 diabetes mellitus with diabetic chronic kidney disease: Secondary | ICD-10-CM | POA: Diagnosis not present

## 2019-12-31 LAB — POCT INR: INR: 4.3 — AB (ref 2.0–3.0)

## 2019-12-31 MED ORDER — ALLOPURINOL 100 MG PO TABS: 200.0000 mg | ORAL_TABLET | Freq: Every day | ORAL | 1 refills | Status: DC

## 2019-12-31 MED ORDER — WARFARIN SODIUM 2.5 MG PO TABS: ORAL_TABLET | ORAL | 3 refills | Status: DC

## 2019-12-31 NOTE — Progress Notes (Signed)
Anticoagulation Management Brittany Roberts is a 79 y.o. female who reports to the clinic for monitoring of warfarin treatment.    Indication: LV Thrombus  Duration: 3 months (expected end date 03/2020) Supervising physician: [facility] Visit History:  Patient does not report signs/symptoms of bleeding or thromboembolism. One episode of mild nose bleed while blowing her nose. Blood only visible on the used tissue. Chronic concerns. Recommend pt use a dehumidifier or a saline nasal spray to improve nasal moisture and reduce the incidence of nasal bleeds.   C/o of blood w/ BM on 6/25. Blood only visible on the tissue paper. Pt attests to straining during the episode. No discoloration of the toilet bowl or reports to dark tarry stool. No recurrence of bleeding symptoms since. Pt aware to continue monitoring.   Other recent changes: No change in diet, medications, lifestyle. Pt has a serving of turnip greens last weekend.   Anticoagulation Episode Summary    Current INR goal:  2.0-3.0  TTR:  0.0 % (4 d)  Next INR check:  01/08/2020  INR from last check:  4.3 (12/31/2019)  Weekly max warfarin dose:    Target end date:  03/14/2020  INR check location:    Preferred lab:    Send INR reminders to:     Indications   LV (left ventricular) mural thrombus [I51.3] Monitoring for long-term anticoagulant use [Z51.81 Z79.01]       Comments:        Anticoagulation Care Providers    Provider Role Specialty Phone number   Nigel Mormon, MD  Cardiology [phone]      Allergies  Allergen Reactions  . Ace Inhibitors Other (See Comments)    Coincided with sig bump in creat. Retried and creat bumped again.   . Isosorb Dinitrate-Hydralazine Other (See Comments)    Sleep all the time.   Marland Kitchen Penicillins Anaphylaxis, Swelling and Rash    Has patient had a PCN reaction causing immediate rash, facial/tongue/throat swelling, SOB or lightheadedness with hypotension:  Yes Has patient had a PCN reaction causing severe rash involving mucus membranes or skin necrosis: Yes Has patient had a PCN reaction that required hospitalization: Yes Has patient had a PCN reaction occurring within the last 10 years: No If all of the above answers are "NO", then may proceed with Cephalosporin use.   Marland Kitchen Buspirone Other (See Comments)    pain   . Pregabalin Swelling  . Ropinirole Hydrochloride Swelling  . Amantadines Rash    "Swelling of the tongue"    Current Outpatient Medications:  .  Accu-Chek Softclix Lancets lancets, Use as instructed, Disp: 100 each, Rfl: 3 .  acetaminophen (TYLENOL) 500 MG tablet, Take 500 mg by mouth every 6 (six) hours as needed for moderate pain., Disp: , Rfl:  .  allopurinol (ZYLOPRIM) 100 MG tablet, Take 2 tablets (200 mg total) by mouth daily., Disp: 180 tablet, Rfl: 1 .  amLODipine (NORVASC) 5 MG tablet, Take 1 tablet (5 mg total) by mouth daily. (Patient taking differently: Take 2.5 mg by mouth daily. ), Disp: 90 tablet, Rfl: 3 .  Ascorbic Acid (VITAMIN C PO), Take 1 tablet by mouth daily., Disp: , Rfl:  .  atorvastatin (LIPITOR) 20 MG tablet, TAKE 1 TABLET(20 MG) BY MOUTH DAILY, Disp: 90 tablet, Rfl: 0 .  B Complex Vitamins (VITAMIN B COMPLEX) TABS, Take 1 tablet by mouth daily., Disp: , Rfl:  .  budesonide-formoterol (SYMBICORT) 80-4.5 MCG/ACT inhaler, Inhale 2 puffs into the  lungs 2 (two) times daily., Disp: 1 Inhaler, Rfl: 3 .  Cholecalciferol (VITAMIN D3) 5000 UNITS CAPS, Take 5,000 Units by mouth daily., Disp: , Rfl:  .  DULoxetine (CYMBALTA) 60 MG capsule, Take 1 capsule (60 mg total) by mouth daily., Disp: 90 capsule, Rfl: 2 .  ferrous sulfate 325 (65 FE) MG tablet, Take 325 mg by mouth daily with breakfast., Disp: , Rfl:  .  Fluocinolone Acetonide Scalp 0.01 % OIL, Apply 1 application topically at bedtime as needed (itching scalp). , Disp: , Rfl:  .  gabapentin (NEURONTIN) 100 MG capsule, Take 1 capsule (100 mg total) by mouth 2  (two) times daily., Disp:  , Rfl:  .  glucose blood (ACCU-CHEK AVIVA PLUS) test strip, Use to test blood sugar three times daily, Disp: 100 each, Rfl: 3 .  guaiFENesin-dextromethorphan (ROBITUSSIN DM) 100-10 MG/5ML syrup, Take 5 mLs by mouth every 4 (four) hours as needed for cough., Disp: 118 mL, Rfl: 0 .  Insulin NPH, Human,, Isophane, (HUMULIN N KWIKPEN) 100 UNIT/ML Kiwkpen, 10 Units am and 10 Units pm, Disp: , Rfl:  .  Insulin Pen Needle (B-D ULTRAFINE III SHORT PEN) 31G X 8 MM MISC, CHECK BLOOD SUGAR 3 TIMES A DAY, Disp: 100 each, Rfl: 3 .  isosorbide mononitrate (IMDUR) 30 MG 24 hr tablet, Take 1 tablet (30 mg total) by mouth daily., Disp: 90 tablet, Rfl: 3 .  levETIRAcetam (KEPPRA) 500 MG tablet, Take 1 tablet (500 mg total) by mouth 2 (two) times daily., Disp: 180 tablet, Rfl: 3 .  linaclotide (LINZESS) 145 MCG CAPS capsule, Take 1 capsule (145 mcg total) by mouth daily as needed (abdominal cramping)., Disp: , Rfl:  .  memantine (NAMENDA) 10 MG tablet, Take 1 tablet (10 mg total) by mouth 2 (two) times daily., Disp: 60 tablet, Rfl: 11 .  metoprolol succinate (TOPROL-XL) 50 MG 24 hr tablet, TAKE 1 TABLET BY MOUTH EVERY DAY. TAKE WITH OR IMMEDIATELY FOLLOWING A MEAL, Disp: 90 tablet, Rfl: 1 .  montelukast (SINGULAIR) 10 MG tablet, Take 1 tablet (10 mg total) by mouth at bedtime., Disp: 90 tablet, Rfl: 3 .  Omega-3 Fatty Acids (FISH OIL) 1000 MG CAPS, Take 1,000 mg by mouth daily. , Disp: , Rfl:  .  pantoprazole (PROTONIX) 40 MG tablet, TAKE 1 TABLET(40 MG) BY MOUTH DAILY (Patient taking differently: Take 40 mg by mouth daily. ), Disp: 90 tablet, Rfl: 3 .  Polyethyl Glycol-Propyl Glycol (SYSTANE) 0.4-0.3 % GEL ophthalmic gel, Place 1 application into both eyes every 4 (four) hours as needed (dry eyes)., Disp: , Rfl:  .  PROAIR HFA 108 (90 Base) MCG/ACT inhaler, INHALE 2 PUFF BY MOUTH EVERY 4 HOURS AS NEEDED USE ONLY IF YOU ARE WHEEZING (Patient taking differently: Inhale 2 puffs into the lungs  every 4 (four) hours as needed for wheezing. ), Disp: 8.5 g, Rfl: 3 .  Spacer/Aero-Holding Chambers (AEROCHAMBER PLUS) inhaler, Use as instructed to use with inahaler., Disp: 1 each, Rfl: 1 .  torsemide (DEMADEX) 20 MG tablet, Take 1 tablet (20 mg total) by mouth 2 (two) times daily. (Patient taking differently: Take 20 mg by mouth daily. ), Disp: 60 tablet, Rfl: 0 .  warfarin (COUMADIN) 2.5 MG tablet, Take 1 tablet by mouth daily as directed. Refer to most recent anticoagulation note for most accurate dosing instructions., Disp: 90 tablet, Rfl: 3 .  warfarin (COUMADIN) 5 MG tablet, Take 1.5 tablets (7.5 mg total) by mouth daily., Disp: 45 tablet, Rfl: 3 Past Medical History:  Diagnosis Date  . Acute delirium 01/14/2017  . Anxiety   . Arthritis    "all over"  . Asthma   . Bipolar disorder ([place])   . Complication of anesthesia    "w/right foot OR they gave me too much and they couldn't get me woke"  . Congestive heart failure, unspecified   . Coronary artery calcification seen on CAT scan 01/14/2017  . Coronary artery disease    "I've got 1 stent" (06/30/2015)  . Depression   . Discoid lupus   . Fibromyalgia    "I've been told I have this; can't take Lyrica cause I'm allergic to it" (06/30/2015)  . GERD (gastroesophageal reflux disease)   . History of gout   . History of hiatal hernia    "real bad" (06/30/2015)  . Hypertension   . Hypothyroidism   . Memory loss   . On home oxygen therapy    "2L q hs" (06/30/2015)  . OSA (obstructive sleep apnea)    "just use my oxygen; no mask" (06/30/2015)  . Other and unspecified hyperlipidemia   . Penetrating foot wound    left nonhealing foot wound on the dorsal surface  . Peripheral neuropathy   . Pneumonia "many of times"  . Refusal of blood transfusions as patient is Jehovah's Witness   . Renal failure, unspecified    "kidneys not working 100%" (06/30/2015)  . Seizure ([place])   . Sickle cell trait ([place])   . Systemic lupus ([place])   .  Thoracic aortic atherosclerosis ([place]) 01/14/2017  . Type II diabetes mellitus ([place])   . Unspecified vitamin D deficiency     ASSESSMENT  Recent Results: The most recent result is correlated with 17.5 mg per week:  Lab Results  Component Value Date   INR 4.3 (A) 12/31/2019   INR 2.8 12/24/2019   INR 5.3 (A) 12/20/2019    Anticoagulation Dosing: Description   INR above goal today. HOLD today and then decrease dose to warfarin 1.25 mg every Tues, Thurs and 2.5 mg all other days. Recheck INR in 1 week      INR today: Supratherapeutic. Pt restarted on 2.5 mg daily after held doses at last visit. INR trended up with current dose. Denies any another changes with diet, lifestyle, or medications since last INR check. Will increase Vit K heavy intake to 2 servings/week. Will decrease weekly dose due to jump in INR of 1.5 units over the past week. Recent episodes of mild nasal and rectal bleeding episodes. Will continue close monitoring.   Pt noted to have patient related factors that would make her more sensitive to warfarin (Hx of CHF, poor renal function, frail and elderly).    PLAN  Weekly dose was decreased by 14.3% to 15 mg/weekly. HOLD warfarin today and then decrease weekly dose to 1.25 mg every Tues and Thurs and 2.5 mg all other days. Recheck INR in 1 week.   Patient Instructions  INR above goal today. HOLD today and then decrease dose to warfarin 1.25 mg every Tues, Thurs and 2.5 mg all other days. Recheck INR in 1 week  Patient advised to contact clinic or seek medical attention if signs/symptoms of bleeding or thromboembolism occur.  Patient verbalized understanding by repeating back information and was advised to contact me if further medication-related questions arise.   Follow-up Return in about 9 days (around 01/09/2020).  Alysia Penna, PharmD  15 minutes spent face-to-face with the patient during the encounter. 50% of time spent on education,  including signs/sx  bleeding and clotting, as well as food and drug interactions with warfarin. 50% of time was spent on fingerprick POC INR sample collection,processing, results determination, and documentation

## 2019-12-31 NOTE — Patient Instructions (Signed)
INR above goal today. HOLD today and then decrease dose to warfarin 1.25 mg every Tues, Thurs and 2.5 mg all other days. Recheck INR in 1 week

## 2020-01-01 ENCOUNTER — Telehealth: Payer: Self-pay | Admitting: Family Medicine

## 2020-01-01 DIAGNOSIS — Z20822 Contact with and (suspected) exposure to covid-19: Secondary | ICD-10-CM | POA: Diagnosis not present

## 2020-01-01 DIAGNOSIS — Z03818 Encounter for observation for suspected exposure to other biological agents ruled out: Secondary | ICD-10-CM | POA: Diagnosis not present

## 2020-01-01 NOTE — Telephone Encounter (Signed)
I called and spoke with pt's daughter. We went over the info below. She had already gone to get her tested and it was negative. She is checking her temp and O2 levels daily. Will let us know if things progress and arrange for a chest x-ray.

## 2020-01-01 NOTE — Telephone Encounter (Signed)
Brittany Roberts (daughter) called to let Dr. [name] know that today the patient coughed up mucus with blood in it. The daughter wants to know if she needs to have her tested again for COVID or what?   She has had both COVID shots  The daughter has been checking her fever and oxygen levels everyday since 06/25  She hasn't had a fever and her oxygen is staying around 97  Please advise

## 2020-01-01 NOTE — Telephone Encounter (Signed)
It is very unlikely for her to have a Covid 19 infection, given the fact she just recovered from COVID 19 pneumonia. If it is a small amount of blood mixed with the sputum, we can continue monitoring, it can be due to throat irritation. Continue monitoring temperature and O2 sats. If problem is worse, coughing more blood,new respiratory symptoms,fever,or MS changes she needs to go to the ER to evaluate for acute lung process. If problem is recurrent but stable , we can arrange CXR.  We can arrange virtual video visit. Thanks, BJ

## 2020-01-02 DIAGNOSIS — J9811 Atelectasis: Secondary | ICD-10-CM | POA: Diagnosis not present

## 2020-01-02 DIAGNOSIS — I2510 Atherosclerotic heart disease of native coronary artery without angina pectoris: Secondary | ICD-10-CM | POA: Diagnosis not present

## 2020-01-02 DIAGNOSIS — D631 Anemia in chronic kidney disease: Secondary | ICD-10-CM | POA: Diagnosis not present

## 2020-01-02 DIAGNOSIS — M150 Primary generalized (osteo)arthritis: Secondary | ICD-10-CM | POA: Diagnosis not present

## 2020-01-02 DIAGNOSIS — I130 Hypertensive heart and chronic kidney disease with heart failure and stage 1 through stage 4 chronic kidney disease, or unspecified chronic kidney disease: Secondary | ICD-10-CM | POA: Diagnosis not present

## 2020-01-02 DIAGNOSIS — I352 Nonrheumatic aortic (valve) stenosis with insufficiency: Secondary | ICD-10-CM | POA: Diagnosis not present

## 2020-01-02 DIAGNOSIS — M1030 Gout due to renal impairment, unspecified site: Secondary | ICD-10-CM | POA: Diagnosis not present

## 2020-01-02 DIAGNOSIS — D630 Anemia in neoplastic disease: Secondary | ICD-10-CM | POA: Diagnosis not present

## 2020-01-02 DIAGNOSIS — J9621 Acute and chronic respiratory failure with hypoxia: Secondary | ICD-10-CM | POA: Diagnosis not present

## 2020-01-02 DIAGNOSIS — I700 Atherosclerosis of aorta: Secondary | ICD-10-CM | POA: Diagnosis not present

## 2020-01-02 DIAGNOSIS — D509 Iron deficiency anemia, unspecified: Secondary | ICD-10-CM | POA: Diagnosis not present

## 2020-01-02 DIAGNOSIS — I5043 Acute on chronic combined systolic (congestive) and diastolic (congestive) heart failure: Secondary | ICD-10-CM | POA: Diagnosis not present

## 2020-01-02 DIAGNOSIS — M797 Fibromyalgia: Secondary | ICD-10-CM | POA: Diagnosis not present

## 2020-01-02 DIAGNOSIS — E1122 Type 2 diabetes mellitus with diabetic chronic kidney disease: Secondary | ICD-10-CM | POA: Diagnosis not present

## 2020-01-02 DIAGNOSIS — N184 Chronic kidney disease, stage 4 (severe): Secondary | ICD-10-CM | POA: Diagnosis not present

## 2020-01-02 DIAGNOSIS — E1165 Type 2 diabetes mellitus with hyperglycemia: Secondary | ICD-10-CM | POA: Diagnosis not present

## 2020-01-02 DIAGNOSIS — E039 Hypothyroidism, unspecified: Secondary | ICD-10-CM | POA: Diagnosis not present

## 2020-01-02 DIAGNOSIS — E1142 Type 2 diabetes mellitus with diabetic polyneuropathy: Secondary | ICD-10-CM | POA: Diagnosis not present

## 2020-01-02 DIAGNOSIS — I472 Ventricular tachycardia: Secondary | ICD-10-CM | POA: Diagnosis not present

## 2020-01-06 DIAGNOSIS — J45909 Unspecified asthma, uncomplicated: Secondary | ICD-10-CM | POA: Diagnosis not present

## 2020-01-06 DIAGNOSIS — I10 Essential (primary) hypertension: Secondary | ICD-10-CM | POA: Diagnosis not present

## 2020-01-06 DIAGNOSIS — I5032 Chronic diastolic (congestive) heart failure: Secondary | ICD-10-CM | POA: Diagnosis not present

## 2020-01-09 ENCOUNTER — Ambulatory Visit: Payer: Medicare Other | Admitting: Pharmacist

## 2020-01-09 ENCOUNTER — Other Ambulatory Visit: Payer: Self-pay

## 2020-01-09 DIAGNOSIS — Z5181 Encounter for therapeutic drug level monitoring: Secondary | ICD-10-CM | POA: Diagnosis not present

## 2020-01-09 DIAGNOSIS — I513 Intracardiac thrombosis, not elsewhere classified: Secondary | ICD-10-CM | POA: Diagnosis not present

## 2020-01-09 DIAGNOSIS — Z7901 Long term (current) use of anticoagulants: Secondary | ICD-10-CM | POA: Diagnosis not present

## 2020-01-09 LAB — POCT INR: INR: 6.4 — AB (ref 2.0–3.0)

## 2020-01-09 NOTE — Patient Instructions (Signed)
INR above goal. HOLD for three day and then continue with 1.25 mg daily. Recheck INR in 1 week

## 2020-01-09 NOTE — Progress Notes (Signed)
Anticoagulation Management Brittany Roberts is a 79 y.o. female who reports to the clinic for monitoring of warfarin treatment.    Indication: LV Thrombus  Duration: 3 months (expected end date 03/2020) Supervising physician: [facility] Visit History:  Patient does report signs/symptoms of bleeding or thromboembolism. Two episodes of nose bleed since last INR check. 1 episode of nose bleed lasting 10-15 mins. Resolved with sustained pressure. No repeat episodes since. 1 episode of coughing up blood. Pt using a nasal canula and wearing it regularly throughout the day. COVID negative on 6/29  Other recent changes: No change in diet, medications, lifestyle. Pt had 2 servings of turnip greens and cabbage last week. Pt also has a small serving of pineapple mango last week.   Anticoagulation Episode Summary    Current INR goal:  2.0-3.0  TTR:  0.0 % (1.9 wk)  Next INR check:  01/16/2020  INR from last check:  6.4 (01/09/2020)  Weekly max warfarin dose:    Target end date:  03/14/2020  INR check location:    Preferred lab:    Send INR reminders to:     Indications   LV (left ventricular) mural thrombus [I51.3] Monitoring for long-term anticoagulant use [Z51.81 Z79.01]       Comments:        Anticoagulation Care Providers    Provider Role Specialty Phone number   Nigel Mormon, MD  Cardiology [phone]      Allergies  Allergen Reactions  . Ace Inhibitors Other (See Comments)    Coincided with sig bump in creat. Retried and creat bumped again.   . Isosorb Dinitrate-Hydralazine Other (See Comments)    Sleep all the time.   Marland Kitchen Penicillins Anaphylaxis, Swelling and Rash    Has patient had a PCN reaction causing immediate rash, facial/tongue/throat swelling, SOB or lightheadedness with hypotension: Yes Has patient had a PCN reaction causing severe rash involving mucus membranes or skin necrosis: Yes Has patient had a PCN reaction that required  hospitalization: Yes Has patient had a PCN reaction occurring within the last 10 years: No If all of the above answers are "NO", then may proceed with Cephalosporin use.   Marland Kitchen Buspirone Other (See Comments)    pain   . Pregabalin Swelling  . Ropinirole Hydrochloride Swelling  . Amantadines Rash    "Swelling of the tongue"    Current Outpatient Medications:  .  Accu-Chek Softclix Lancets lancets, Use as instructed, Disp: 100 each, Rfl: 3 .  acetaminophen (TYLENOL) 500 MG tablet, Take 500 mg by mouth every 6 (six) hours as needed for moderate pain., Disp: , Rfl:  .  allopurinol (ZYLOPRIM) 100 MG tablet, Take 2 tablets (200 mg total) by mouth daily., Disp: 180 tablet, Rfl: 1 .  amLODipine (NORVASC) 5 MG tablet, Take 1 tablet (5 mg total) by mouth daily. (Patient taking differently: Take 2.5 mg by mouth daily. ), Disp: 90 tablet, Rfl: 3 .  Ascorbic Acid (VITAMIN C PO), Take 1 tablet by mouth daily., Disp: , Rfl:  .  atorvastatin (LIPITOR) 20 MG tablet, TAKE 1 TABLET(20 MG) BY MOUTH DAILY, Disp: 90 tablet, Rfl: 0 .  B Complex Vitamins (VITAMIN B COMPLEX) TABS, Take 1 tablet by mouth daily., Disp: , Rfl:  .  budesonide-formoterol (SYMBICORT) 80-4.5 MCG/ACT inhaler, Inhale 2 puffs into the lungs 2 (two) times daily., Disp: 1 Inhaler, Rfl: 3 .  Cholecalciferol (VITAMIN D3) 5000 UNITS CAPS, Take 5,000 Units by mouth daily., Disp: , Rfl:  .  DULoxetine (CYMBALTA) 60 MG capsule, Take 1 capsule (60 mg total) by mouth daily., Disp: 90 capsule, Rfl: 2 .  ferrous sulfate 325 (65 FE) MG tablet, Take 325 mg by mouth daily with breakfast., Disp: , Rfl:  .  Fluocinolone Acetonide Scalp 0.01 % OIL, Apply 1 application topically at bedtime as needed (itching scalp). , Disp: , Rfl:  .  gabapentin (NEURONTIN) 100 MG capsule, Take 1 capsule (100 mg total) by mouth 2 (two) times daily., Disp:  , Rfl:  .  glucose blood (ACCU-CHEK AVIVA PLUS) test strip, Use to test blood sugar three times daily, Disp: 100 each, Rfl:  3 .  guaiFENesin-dextromethorphan (ROBITUSSIN DM) 100-10 MG/5ML syrup, Take 5 mLs by mouth every 4 (four) hours as needed for cough., Disp: 118 mL, Rfl: 0 .  Insulin NPH, Human,, Isophane, (HUMULIN N KWIKPEN) 100 UNIT/ML Kiwkpen, 10 Units am and 10 Units pm, Disp: , Rfl:  .  Insulin Pen Needle (B-D ULTRAFINE III SHORT PEN) 31G X 8 MM MISC, CHECK BLOOD SUGAR 3 TIMES A DAY, Disp: 100 each, Rfl: 3 .  isosorbide mononitrate (IMDUR) 30 MG 24 hr tablet, Take 1 tablet (30 mg total) by mouth daily., Disp: 90 tablet, Rfl: 3 .  levETIRAcetam (KEPPRA) 500 MG tablet, Take 1 tablet (500 mg total) by mouth 2 (two) times daily., Disp: 180 tablet, Rfl: 3 .  linaclotide (LINZESS) 145 MCG CAPS capsule, Take 1 capsule (145 mcg total) by mouth daily as needed (abdominal cramping)., Disp: , Rfl:  .  memantine (NAMENDA) 10 MG tablet, Take 1 tablet (10 mg total) by mouth 2 (two) times daily., Disp: 60 tablet, Rfl: 11 .  metoprolol succinate (TOPROL-XL) 50 MG 24 hr tablet, TAKE 1 TABLET BY MOUTH EVERY DAY. TAKE WITH OR IMMEDIATELY FOLLOWING A MEAL, Disp: 90 tablet, Rfl: 1 .  montelukast (SINGULAIR) 10 MG tablet, Take 1 tablet (10 mg total) by mouth at bedtime., Disp: 90 tablet, Rfl: 3 .  Omega-3 Fatty Acids (FISH OIL) 1000 MG CAPS, Take 1,000 mg by mouth daily. , Disp: , Rfl:  .  pantoprazole (PROTONIX) 40 MG tablet, TAKE 1 TABLET(40 MG) BY MOUTH DAILY (Patient taking differently: Take 40 mg by mouth daily. ), Disp: 90 tablet, Rfl: 3 .  Polyethyl Glycol-Propyl Glycol (SYSTANE) 0.4-0.3 % GEL ophthalmic gel, Place 1 application into both eyes every 4 (four) hours as needed (dry eyes)., Disp: , Rfl:  .  PROAIR HFA 108 (90 Base) MCG/ACT inhaler, INHALE 2 PUFF BY MOUTH EVERY 4 HOURS AS NEEDED USE ONLY IF YOU ARE WHEEZING (Patient taking differently: Inhale 2 puffs into the lungs every 4 (four) hours as needed for wheezing. ), Disp: 8.5 g, Rfl: 3 .  Spacer/Aero-Holding Chambers (AEROCHAMBER PLUS) inhaler, Use as instructed to use  with inahaler., Disp: 1 each, Rfl: 1 .  torsemide (DEMADEX) 20 MG tablet, Take 1 tablet (20 mg total) by mouth 2 (two) times daily. (Patient taking differently: Take 20 mg by mouth daily. ), Disp: 60 tablet, Rfl: 0 .  warfarin (COUMADIN) 2.5 MG tablet, Take 1 tablet by mouth daily as directed. Refer to most recent anticoagulation note for most accurate dosing instructions., Disp: 90 tablet, Rfl: 3 .  warfarin (COUMADIN) 5 MG tablet, Take 1.5 tablets (7.5 mg total) by mouth daily., Disp: 45 tablet, Rfl: 3 Past Medical History:  Diagnosis Date  . Acute delirium 01/14/2017  . Anxiety   . Arthritis    "all over"  . Asthma   . Bipolar disorder ([place])   .  Complication of anesthesia    "w/right foot OR they gave me too much and they couldn't get me woke"  . Congestive heart failure, unspecified   . Coronary artery calcification seen on CAT scan 01/14/2017  . Coronary artery disease    "I've got 1 stent" (06/30/2015)  . Depression   . Discoid lupus   . Fibromyalgia    "I've been told I have this; can't take Lyrica cause I'm allergic to it" (06/30/2015)  . GERD (gastroesophageal reflux disease)   . History of gout   . History of hiatal hernia    "real bad" (06/30/2015)  . Hypertension   . Hypothyroidism   . Memory loss   . On home oxygen therapy    "2L q hs" (06/30/2015)  . OSA (obstructive sleep apnea)    "just use my oxygen; no mask" (06/30/2015)  . Other and unspecified hyperlipidemia   . Penetrating foot wound    left nonhealing foot wound on the dorsal surface  . Peripheral neuropathy   . Pneumonia "many of times"  . Refusal of blood transfusions as patient is Jehovah's Witness   . Renal failure, unspecified    "kidneys not working 100%" (06/30/2015)  . Seizure ([place])   . Sickle cell trait ([place])   . Systemic lupus ([place])   . Thoracic aortic atherosclerosis ([place]) 01/14/2017  . Type II diabetes mellitus ([place])   . Unspecified vitamin D deficiency     ASSESSMENT  Recent  Results: The most recent result is correlated with 17.5 mg per week:  Lab Results  Component Value Date   INR 6.4 (A) 01/09/2020   INR 4.3 (A) 12/31/2019   INR 2.8 12/24/2019    Anticoagulation Dosing: Description   INR above goal. HOLD for three day and then continue with 1.25 mg daily. Recheck INR in 1 week      INR today: Supratherapeutic. INR trends up after dose decrease last visit. Recent episodes of nose bleeds and blood in mucus while cough. Reviewed how to appropriately manage nose bleeds and when to reach out for additional help if bleeding continues. Will HOLD x3 and dose decrease to 1.25 mg daily and increase Vit K heavy intake to 3-4/week. Will continue close monitoring.   Pt noted to have patient related factors that would make her more sensitive to warfarin (Hx of CHF, poor renal function, frail and elderly).    PLAN  Weekly dose was decreased by 25% to 11.25 mg/weekly. HOLD warfarin for three days and then decrease weekly dose to 1.25 mg daily. Recheck INR in 1 week.   Patient Instructions  INR above goal. HOLD for three day and then continue with 1.25 mg daily. Recheck INR in 1 week  Patient advised to contact clinic or seek medical attention if signs/symptoms of bleeding or thromboembolism occur.  Patient verbalized understanding by repeating back information and was advised to contact me if further medication-related questions arise.   Follow-up Return in about 1 week (around 01/16/2020).  Alysia Penna, PharmD  15 minutes spent face-to-face with the patient during the encounter. 50% of time spent on education, including signs/sx bleeding and clotting, as well as food and drug interactions with warfarin. 50% of time was spent on fingerprick POC INR sample collection,processing, results determination, and documentation

## 2020-01-10 ENCOUNTER — Telehealth: Payer: Self-pay | Admitting: Family Medicine

## 2020-01-10 ENCOUNTER — Other Ambulatory Visit: Payer: Self-pay | Admitting: Family Medicine

## 2020-01-10 MED ORDER — GABAPENTIN 100 MG PO CAPS: 100.0000 mg | ORAL_CAPSULE | Freq: Two times a day (BID) | ORAL | 3 refills | Status: DC

## 2020-01-10 NOTE — Telephone Encounter (Signed)
Pts daughter is calling in stating that they got a call from the pharmacy stating that the pts gabapentin dosage has been changed from 300 MG to 100 MG and they were not aware of the change.  The pt is out of the medication and will need it for tomorrow.  Pts daughter would like to have a call back tomorrow morning so that she can go and pick the medication up from the pharmacy.

## 2020-01-10 NOTE — Telephone Encounter (Signed)
Per the last refill request, the Rx was decreased from 300 mg to 100mg .

## 2020-01-11 MED ORDER — TORSEMIDE 20 MG PO TABS: 20.0000 mg | ORAL_TABLET | Freq: Two times a day (BID) | ORAL | 2 refills | Status: DC

## 2020-01-11 NOTE — Telephone Encounter (Signed)
According to last hospital discharge summery, Gabapentin dose was decreased to 100 mg bid. Has she noted any new symptom or worsening myalgias since dose was decreased? I really prefer the lower dose with the max benefits, so if no changes in symptoms (fibromyalgia) I would prefer to keep dose she is taking now. Thanks, BJ

## 2020-01-11 NOTE — Telephone Encounter (Signed)
I spoke with pt's daughter. We went over the information below, she verbalized understanding. Will let us know if the pain increases while on the lower dose. Rx refill sent in for Torsemide.

## 2020-01-11 NOTE — Addendum Note (Signed)
Addended by: Rodrigo Ran on: 01/11/2020 08:20 AM   Modules accepted: Orders

## 2020-01-12 DIAGNOSIS — I10 Essential (primary) hypertension: Secondary | ICD-10-CM | POA: Diagnosis not present

## 2020-01-12 DIAGNOSIS — M25869 Other specified joint disorders, unspecified knee: Secondary | ICD-10-CM | POA: Diagnosis not present

## 2020-01-12 DIAGNOSIS — J45909 Unspecified asthma, uncomplicated: Secondary | ICD-10-CM | POA: Diagnosis not present

## 2020-01-16 ENCOUNTER — Other Ambulatory Visit: Payer: Self-pay

## 2020-01-16 ENCOUNTER — Ambulatory Visit: Payer: Medicare Other | Admitting: Pharmacist

## 2020-01-16 DIAGNOSIS — I513 Intracardiac thrombosis, not elsewhere classified: Secondary | ICD-10-CM

## 2020-01-16 DIAGNOSIS — Z5181 Encounter for therapeutic drug level monitoring: Secondary | ICD-10-CM

## 2020-01-16 DIAGNOSIS — Z7901 Long term (current) use of anticoagulants: Secondary | ICD-10-CM | POA: Diagnosis not present

## 2020-01-16 LAB — POCT INR: INR: 2.8 (ref 2.0–3.0)

## 2020-01-16 NOTE — Patient Instructions (Signed)
INR at goal. Continue with 1.25 mg daily. Recheck INR in 1 week

## 2020-01-16 NOTE — Progress Notes (Signed)
Collard green, cabbage, toss salad (2 serving), cole slaw (1)   Anticoagulation Management Brittany Roberts is a 79 y.o. female who reports to the clinic for monitoring of warfarin treatment.    Indication: LV Thrombus  Duration: 3 months (expected end date 03/2020) Supervising physician: [facility] Visit History:  Patient does not report signs/symptoms of bleeding or thromboembolism. Mild a mild episode of epistaxis x1 last week. Improved from previous episodes.   Other recent changes: No change in diet, medications, lifestyle. Pt had 2 servings of collard greens and cole slaw, and 2 servings of salad last week.   Anticoagulation Episode Summary    Current INR goal:  2.0-3.0  TTR:  1.9 % (2.9 wk)  Next INR check:  01/23/2020  INR from last check:  2.8 (01/16/2020)  Weekly max warfarin dose:    Target end date:  03/14/2020  INR check location:    Preferred lab:    Send INR reminders to:     Indications   LV (left ventricular) mural thrombus [I51.3] Monitoring for long-term anticoagulant use [Z51.81 Z79.01]       Comments:        Anticoagulation Care Providers    Provider Role Specialty Phone number   Nigel Mormon, MD  Cardiology [phone]      Allergies  Allergen Reactions  . Ace Inhibitors Other (See Comments)    Coincided with sig bump in creat. Retried and creat bumped again.   . Isosorb Dinitrate-Hydralazine Other (See Comments)    Sleep all the time.   Marland Kitchen Penicillins Anaphylaxis, Swelling and Rash    Has patient had a PCN reaction causing immediate rash, facial/tongue/throat swelling, SOB or lightheadedness with hypotension: Yes Has patient had a PCN reaction causing severe rash involving mucus membranes or skin necrosis: Yes Has patient had a PCN reaction that required hospitalization: Yes Has patient had a PCN reaction occurring within the last 10 years: No If all of the above answers are "NO", then may proceed with  Cephalosporin use.   Marland Kitchen Buspirone Other (See Comments)    pain   . Pregabalin Swelling  . Ropinirole Hydrochloride Swelling  . Amantadines Rash    "Swelling of the tongue"    Current Outpatient Medications:  .  Accu-Chek Softclix Lancets lancets, Use as instructed, Disp: 100 each, Rfl: 3 .  acetaminophen (TYLENOL) 500 MG tablet, Take 500 mg by mouth every 6 (six) hours as needed for moderate pain., Disp: , Rfl:  .  allopurinol (ZYLOPRIM) 100 MG tablet, Take 2 tablets (200 mg total) by mouth daily., Disp: 180 tablet, Rfl: 1 .  amLODipine (NORVASC) 5 MG tablet, Take 1 tablet (5 mg total) by mouth daily. (Patient taking differently: Take 2.5 mg by mouth daily. ), Disp: 90 tablet, Rfl: 3 .  Ascorbic Acid (VITAMIN C PO), Take 1 tablet by mouth daily., Disp: , Rfl:  .  atorvastatin (LIPITOR) 20 MG tablet, TAKE 1 TABLET(20 MG) BY MOUTH DAILY, Disp: 90 tablet, Rfl: 0 .  B Complex Vitamins (VITAMIN B COMPLEX) TABS, Take 1 tablet by mouth daily., Disp: , Rfl:  .  budesonide-formoterol (SYMBICORT) 80-4.5 MCG/ACT inhaler, Inhale 2 puffs into the lungs 2 (two) times daily., Disp: 1 Inhaler, Rfl: 3 .  Cholecalciferol (VITAMIN D3) 5000 UNITS CAPS, Take 5,000 Units by mouth daily., Disp: , Rfl:  .  DULoxetine (CYMBALTA) 60 MG capsule, Take 1 capsule (60 mg total) by mouth daily., Disp: 90 capsule, Rfl: 2 .  ferrous sulfate  325 (65 FE) MG tablet, Take 325 mg by mouth daily with breakfast., Disp: , Rfl:  .  Fluocinolone Acetonide Scalp 0.01 % OIL, Apply 1 application topically at bedtime as needed (itching scalp). , Disp: , Rfl:  .  gabapentin (NEURONTIN) 100 MG capsule, Take 1 capsule (100 mg total) by mouth 2 (two) times daily., Disp: 60 capsule, Rfl: 3 .  glucose blood (ACCU-CHEK AVIVA PLUS) test strip, Use to test blood sugar three times daily, Disp: 100 each, Rfl: 3 .  guaiFENesin-dextromethorphan (ROBITUSSIN DM) 100-10 MG/5ML syrup, Take 5 mLs by mouth every 4 (four) hours as needed for cough., Disp:  118 mL, Rfl: 0 .  Insulin NPH, Human,, Isophane, (HUMULIN N KWIKPEN) 100 UNIT/ML Kiwkpen, 10 Units am and 10 Units pm, Disp: , Rfl:  .  Insulin Pen Needle (B-D ULTRAFINE III SHORT PEN) 31G X 8 MM MISC, CHECK BLOOD SUGAR 3 TIMES A DAY, Disp: 100 each, Rfl: 3 .  isosorbide mononitrate (IMDUR) 30 MG 24 hr tablet, Take 1 tablet (30 mg total) by mouth daily., Disp: 90 tablet, Rfl: 3 .  levETIRAcetam (KEPPRA) 500 MG tablet, Take 1 tablet (500 mg total) by mouth 2 (two) times daily., Disp: 180 tablet, Rfl: 3 .  linaclotide (LINZESS) 145 MCG CAPS capsule, Take 1 capsule (145 mcg total) by mouth daily as needed (abdominal cramping)., Disp: , Rfl:  .  memantine (NAMENDA) 10 MG tablet, Take 1 tablet (10 mg total) by mouth 2 (two) times daily., Disp: 60 tablet, Rfl: 11 .  metoprolol succinate (TOPROL-XL) 50 MG 24 hr tablet, TAKE 1 TABLET BY MOUTH EVERY DAY. TAKE WITH OR IMMEDIATELY FOLLOWING A MEAL, Disp: 90 tablet, Rfl: 1 .  montelukast (SINGULAIR) 10 MG tablet, Take 1 tablet (10 mg total) by mouth at bedtime., Disp: 90 tablet, Rfl: 3 .  Omega-3 Fatty Acids (FISH OIL) 1000 MG CAPS, Take 1,000 mg by mouth daily. , Disp: , Rfl:  .  pantoprazole (PROTONIX) 40 MG tablet, TAKE 1 TABLET(40 MG) BY MOUTH DAILY (Patient taking differently: Take 40 mg by mouth daily. ), Disp: 90 tablet, Rfl: 3 .  Polyethyl Glycol-Propyl Glycol (SYSTANE) 0.4-0.3 % GEL ophthalmic gel, Place 1 application into both eyes every 4 (four) hours as needed (dry eyes)., Disp: , Rfl:  .  PROAIR HFA 108 (90 Base) MCG/ACT inhaler, INHALE 2 PUFF BY MOUTH EVERY 4 HOURS AS NEEDED USE ONLY IF YOU ARE WHEEZING (Patient taking differently: Inhale 2 puffs into the lungs every 4 (four) hours as needed for wheezing. ), Disp: 8.5 g, Rfl: 3 .  Spacer/Aero-Holding Chambers (AEROCHAMBER PLUS) inhaler, Use as instructed to use with inahaler., Disp: 1 each, Rfl: 1 .  torsemide (DEMADEX) 20 MG tablet, Take 1 tablet (20 mg total) by mouth 2 (two) times daily., Disp: 60  tablet, Rfl: 2 .  warfarin (COUMADIN) 2.5 MG tablet, Take 1 tablet by mouth daily as directed. Refer to most recent anticoagulation note for most accurate dosing instructions., Disp: 90 tablet, Rfl: 3 .  warfarin (COUMADIN) 5 MG tablet, Take 1.5 tablets (7.5 mg total) by mouth daily., Disp: 45 tablet, Rfl: 3 Past Medical History:  Diagnosis Date  . Acute delirium 01/14/2017  . Anxiety   . Arthritis    "all over"  . Asthma   . Bipolar disorder ([place])   . Complication of anesthesia    "w/right foot OR they gave me too much and they couldn't get me woke"  . Congestive heart failure, unspecified   . Coronary artery  calcification seen on CAT scan 01/14/2017  . Coronary artery disease    "I've got 1 stent" (06/30/2015)  . Depression   . Discoid lupus   . Fibromyalgia    "I've been told I have this; can't take Lyrica cause I'm allergic to it" (06/30/2015)  . GERD (gastroesophageal reflux disease)   . History of gout   . History of hiatal hernia    "real bad" (06/30/2015)  . Hypertension   . Hypothyroidism   . Memory loss   . On home oxygen therapy    "2L q hs" (06/30/2015)  . OSA (obstructive sleep apnea)    "just use my oxygen; no mask" (06/30/2015)  . Other and unspecified hyperlipidemia   . Penetrating foot wound    left nonhealing foot wound on the dorsal surface  . Peripheral neuropathy   . Pneumonia "many of times"  . Refusal of blood transfusions as patient is Jehov[employer]'s Witness   . Renal failure, unspecified    "kidneys not working 100%" (06/30/2015)  . Seizure ([place])   . Sickle cell trait ([place])   . Systemic lupus ([place])   . Thoracic aortic atherosclerosis ([place]) 01/14/2017  . Type II diabetes mellitus ([place])   . Unspecified vitamin D deficiency     ASSESSMENT  Recent Results: The most recent result is correlated with 5 mg per week:  Lab Results  Component Value Date   INR 2.8 01/16/2020   INR 6.4 (A) 01/09/2020   INR 4.3 (A) 12/31/2019    Anticoagulation  Dosing: Description   INR at goal. Continue with 1.25 mg daily. Recheck INR in 1 week      INR today: Therapeutic. Following held doses and weekly dose decrease last check. Pt also increased her Vit K intake compared to previous INR check. No recurrence of previous bleeding episodes. Pt tolerating current dose well without any ADRs. Pt started walking around without the need of the nasal canula. Weekly dose last week of 5 mg. Will dose adjust to weekly dose to 8.75 mg and continue close monitoring.   Pt noted to have patient related factors that would make her more sensitive to warfarin (Hx of CHF, poor renal function, frail and elderly).    PLAN  Weekly dose was decreased by 22.2% to 8.75 mg/weekly. Continue taking 1.25 mg daily. Recheck INR in 1 week.   Patient Instructions  INR at goal. Continue with 1.25 mg daily. Recheck INR in 1 week  Patient advised to contact clinic or seek medical attention if signs/symptoms of bleeding or thromboembolism occur.  Patient verbalized understanding by repeating back information and was advised to contact me if further medication-related questions arise.   Follow-up Return in about 1 week (around 01/23/2020).  Alysia Penna, PharmD  15 minutes spent face-to-face with the patient during the encounter. 50% of time spent on education, including signs/sx bleeding and clotting, as well as food and drug interactions with warfarin. 50% of time was spent on fingerprick POC INR sample collection,processing, results determination, and documentation

## 2020-01-23 ENCOUNTER — Ambulatory Visit: Payer: Medicare Other | Admitting: Pharmacist

## 2020-01-23 ENCOUNTER — Other Ambulatory Visit: Payer: Self-pay

## 2020-01-23 DIAGNOSIS — R799 Abnormal finding of blood chemistry, unspecified: Secondary | ICD-10-CM | POA: Diagnosis not present

## 2020-01-23 DIAGNOSIS — I513 Intracardiac thrombosis, not elsewhere classified: Secondary | ICD-10-CM

## 2020-01-23 DIAGNOSIS — Z7901 Long term (current) use of anticoagulants: Secondary | ICD-10-CM | POA: Diagnosis not present

## 2020-01-23 DIAGNOSIS — Z5181 Encounter for therapeutic drug level monitoring: Secondary | ICD-10-CM | POA: Diagnosis not present

## 2020-01-23 DIAGNOSIS — N1832 Chronic kidney disease, stage 3b: Secondary | ICD-10-CM | POA: Diagnosis not present

## 2020-01-23 LAB — POCT INR: INR: 2 (ref 2.0–3.0)

## 2020-01-23 NOTE — Patient Instructions (Addendum)
INSTRUCTIONS: INR at goal. Continue with 1.25 mg daily. Recheck INR in 1 week  Nosebleed, Adult A nosebleed is when blood comes out of the nose. Nosebleeds are common. Usually, they are not a sign of a serious condition. Nosebleeds can happen if a small blood vessel in your nose starts to bleed or if the lining of your nose (mucous membrane) cracks. They are commonly caused by:  Allergies.  Colds.  Picking your nose.  Blowing your nose too hard.  An injury from sticking an object into your nose or getting hit in the nose.  Dry or cold air. Less common causes of nosebleeds include:  Toxic fumes.  Something abnormal in the nose or in the air-filled spaces in the bones of the face (sinuses).  Growths in the nose, such as polyps.  Medicines or conditions that cause blood to clot slowly.  Certain illnesses or procedures that irritate or dry out the nasal passages. Follow these instructions at home: When you have a nosebleed:         Sit down and tilt your head slightly forward.  Use a clean towel or tissue to pinch your nostrils under the bony part of your nose. After 10 minutes, let go of your nose and see if bleeding starts again. Do not release pressure before that time. If there is still bleeding, repeat the pinching and holding for 10 minutes until the bleeding stops.  Do not place tissues or gauze in the nose to stop bleeding.  Avoid lying down and avoid tilting your head backward. That may make blood collect in the throat and cause gagging or coughing.  Use a nasal spray decongestant to help with a nosebleed as told by your health care provider.  Do not use petroleum jelly or mineral oil in your nose. It can drip into your lungs. After a nosebleed:  Avoid blowing your nose or sniffing for a number of hours.  Avoid straining, lifting, or bending at the waist for several days. You may resume other normal activities as you are able.  Use saline spray or a  humidifier as told by your health care provider.  Aspirinand blood thinners make bleeding more likely. If you are prescribed these medicines and you suffer from nosebleeds: ? Ask your health care provider if you should stop taking the medicines or if you should adjust the dose. ? Do not stop taking medicines that your health care provider has recommended unless told by your health care provider.  If your nosebleed was caused by dry mucous membranes, use over-the-counter saline nasal spray or gel. This will keep the mucous membranes moist and allow them to heal. If you must use a lubricant: ? Choose one that is water-soluble. ? Use only as much as you need and use it only as often as needed. ? Do not lie down until several hours after you use it. Contact a health care provider if:  You have a fever.  You get nosebleeds often or more often than usual.  You bruise very easily.  You have a nosebleed from having something stuck in your nose.  You have bleeding in your mouth.  You vomit or cough up brown material.  You have a nosebleed after you start a new medicine. Get help right away if:  You have a nosebleed after a fall or a head injury.  Your nosebleed does not go away after 20 minutes.  You feel dizzy or weak.  You have unusual bleeding from other  parts of your body.  You have unusual bruising on other parts of your body.  You become sweaty.  You vomit blood. This information is not intended to replace advice given to you by your health care provider. Make sure you discuss any questions you have with your health care provider. Document Revised: 09/20/2017 Document Reviewed: 01/06/2016 Elsevier Patient Education  [place].

## 2020-01-23 NOTE — Progress Notes (Signed)
Anticoagulation Management Brittany Roberts is a 79 y.o. female who reports to the clinic for monitoring of warfarin treatment.    Indication: LV Thrombus  Duration: 3 months (expected end date 03/2020) Supervising physician: [facility] Visit History:  Patient does not report signs/symptoms of bleeding or thromboembolism. Episode epistaxis x1 last Friday. Symptoms lasted for 10 mins. Improved with holding pressure and "packing her nose w/ tissue paper". No recurrence since.   Other recent changes: No change in diet, medications, lifestyle. Pt had 1 serving of cabbage and 2 servings of turnip greens last week. Pt continues to be stable on 3L of O2. Working with PT   Anticoagulation Episode Summary    Current INR goal:  2.0-3.0  TTR:  26.8 % (3.9 wk)  Next INR check:  01/30/2020  INR from last check:  2.0 (01/23/2020)  Weekly max warfarin dose:    Target end date:  03/14/2020  INR check location:    Preferred lab:    Send INR reminders to:     Indications   LV (left ventricular) mural thrombus [I51.3] Monitoring for long-term anticoagulant use [Z51.81 Z79.01]       Comments:        Anticoagulation Care Providers    Provider Role Specialty Phone number   Nigel Mormon, MD  Cardiology [phone]      Allergies  Allergen Reactions  . Ace Inhibitors Other (See Comments)    Coincided with sig bump in creat. Retried and creat bumped again.   . Isosorb Dinitrate-Hydralazine Other (See Comments)    Sleep all the time.   Marland Kitchen Penicillins Anaphylaxis, Swelling and Rash    Has patient had a PCN reaction causing immediate rash, facial/tongue/throat swelling, SOB or lightheadedness with hypotension: Yes Has patient had a PCN reaction causing severe rash involving mucus membranes or skin necrosis: Yes Has patient had a PCN reaction that required hospitalization: Yes Has patient had a PCN reaction occurring within the last 10 years: No If all of the  above answers are "NO", then may proceed with Cephalosporin use.   Marland Kitchen Buspirone Other (See Comments)    pain   . Pregabalin Swelling  . Ropinirole Hydrochloride Swelling  . Amantadines Rash    "Swelling of the tongue"    Current Outpatient Medications:  .  Accu-Chek Softclix Lancets lancets, Use as instructed, Disp: 100 each, Rfl: 3 .  acetaminophen (TYLENOL) 500 MG tablet, Take 500 mg by mouth every 6 (six) hours as needed for moderate pain., Disp: , Rfl:  .  allopurinol (ZYLOPRIM) 100 MG tablet, Take 2 tablets (200 mg total) by mouth daily., Disp: 180 tablet, Rfl: 1 .  amLODipine (NORVASC) 5 MG tablet, Take 1 tablet (5 mg total) by mouth daily. (Patient taking differently: Take 2.5 mg by mouth daily. ), Disp: 90 tablet, Rfl: 3 .  Ascorbic Acid (VITAMIN C PO), Take 1 tablet by mouth daily., Disp: , Rfl:  .  atorvastatin (LIPITOR) 20 MG tablet, TAKE 1 TABLET(20 MG) BY MOUTH DAILY, Disp: 90 tablet, Rfl: 0 .  B Complex Vitamins (VITAMIN B COMPLEX) TABS, Take 1 tablet by mouth daily., Disp: , Rfl:  .  budesonide-formoterol (SYMBICORT) 80-4.5 MCG/ACT inhaler, Inhale 2 puffs into the lungs 2 (two) times daily., Disp: 1 Inhaler, Rfl: 3 .  Cholecalciferol (VITAMIN D3) 5000 UNITS CAPS, Take 5,000 Units by mouth daily., Disp: , Rfl:  .  DULoxetine (CYMBALTA) 60 MG capsule, Take 1 capsule (60 mg total) by mouth daily.,  Disp: 90 capsule, Rfl: 2 .  ferrous sulfate 325 (65 FE) MG tablet, Take 325 mg by mouth daily with breakfast., Disp: , Rfl:  .  Fluocinolone Acetonide Scalp 0.01 % OIL, Apply 1 application topically at bedtime as needed (itching scalp). , Disp: , Rfl:  .  gabapentin (NEURONTIN) 100 MG capsule, Take 1 capsule (100 mg total) by mouth 2 (two) times daily., Disp: 60 capsule, Rfl: 3 .  glucose blood (ACCU-CHEK AVIVA PLUS) test strip, Use to test blood sugar three times daily, Disp: 100 each, Rfl: 3 .  guaiFENesin-dextromethorphan (ROBITUSSIN DM) 100-10 MG/5ML syrup, Take 5 mLs by mouth every  4 (four) hours as needed for cough., Disp: 118 mL, Rfl: 0 .  Insulin NPH, Human,, Isophane, (HUMULIN N KWIKPEN) 100 UNIT/ML Kiwkpen, 10 Units am and 10 Units pm, Disp: , Rfl:  .  Insulin Pen Needle (B-D ULTRAFINE III SHORT PEN) 31G X 8 MM MISC, CHECK BLOOD SUGAR 3 TIMES A DAY, Disp: 100 each, Rfl: 3 .  isosorbide mononitrate (IMDUR) 30 MG 24 hr tablet, Take 1 tablet (30 mg total) by mouth daily., Disp: 90 tablet, Rfl: 3 .  levETIRAcetam (KEPPRA) 500 MG tablet, Take 1 tablet (500 mg total) by mouth 2 (two) times daily., Disp: 180 tablet, Rfl: 3 .  linaclotide (LINZESS) 145 MCG CAPS capsule, Take 1 capsule (145 mcg total) by mouth daily as needed (abdominal cramping)., Disp: , Rfl:  .  memantine (NAMENDA) 10 MG tablet, Take 1 tablet (10 mg total) by mouth 2 (two) times daily., Disp: 60 tablet, Rfl: 11 .  metoprolol succinate (TOPROL-XL) 50 MG 24 hr tablet, TAKE 1 TABLET BY MOUTH EVERY DAY. TAKE WITH OR IMMEDIATELY FOLLOWING A MEAL, Disp: 90 tablet, Rfl: 1 .  montelukast (SINGULAIR) 10 MG tablet, Take 1 tablet (10 mg total) by mouth at bedtime., Disp: 90 tablet, Rfl: 3 .  Omega-3 Fatty Acids (FISH OIL) 1000 MG CAPS, Take 1,000 mg by mouth daily. , Disp: , Rfl:  .  pantoprazole (PROTONIX) 40 MG tablet, TAKE 1 TABLET(40 MG) BY MOUTH DAILY (Patient taking differently: Take 40 mg by mouth daily. ), Disp: 90 tablet, Rfl: 3 .  Polyethyl Glycol-Propyl Glycol (SYSTANE) 0.4-0.3 % GEL ophthalmic gel, Place 1 application into both eyes every 4 (four) hours as needed (dry eyes)., Disp: , Rfl:  .  PROAIR HFA 108 (90 Base) MCG/ACT inhaler, INHALE 2 PUFF BY MOUTH EVERY 4 HOURS AS NEEDED USE ONLY IF YOU ARE WHEEZING (Patient taking differently: Inhale 2 puffs into the lungs every 4 (four) hours as needed for wheezing. ), Disp: 8.5 g, Rfl: 3 .  Spacer/Aero-Holding Chambers (AEROCHAMBER PLUS) inhaler, Use as instructed to use with inahaler., Disp: 1 each, Rfl: 1 .  torsemide (DEMADEX) 20 MG tablet, Take 1 tablet (20 mg  total) by mouth 2 (two) times daily., Disp: 60 tablet, Rfl: 2 .  warfarin (COUMADIN) 2.5 MG tablet, Take 1 tablet by mouth daily as directed. Refer to most recent anticoagulation note for most accurate dosing instructions., Disp: 90 tablet, Rfl: 3 .  warfarin (COUMADIN) 5 MG tablet, Take 1.5 tablets (7.5 mg total) by mouth daily., Disp: 45 tablet, Rfl: 3 Past Medical History:  Diagnosis Date  . Acute delirium 01/14/2017  . Anxiety   . Arthritis    "all over"  . Asthma   . Bipolar disorder ([place])   . Complication of anesthesia    "w/right foot OR they gave me too much and they couldn't get me woke"  .  Congestive heart failure, unspecified   . Coronary artery calcification seen on CAT scan 01/14/2017  . Coronary artery disease    "I've got 1 stent" (06/30/2015)  . Depression   . Discoid lupus   . Fibromyalgia    "I've been told I have this; can't take Lyrica cause I'm allergic to it" (06/30/2015)  . GERD (gastroesophageal reflux disease)   . History of gout   . History of hiatal hernia    "real bad" (06/30/2015)  . Hypertension   . Hypothyroidism   . Memory loss   . On home oxygen therapy    "2L q hs" (06/30/2015)  . OSA (obstructive sleep apnea)    "just use my oxygen; no mask" (06/30/2015)  . Other and unspecified hyperlipidemia   . Penetrating foot wound    left nonhealing foot wound on the dorsal surface  . Peripheral neuropathy   . Pneumonia "many of times"  . Refusal of blood transfusions as patient is Jehovah's Witness   . Renal failure, unspecified    "kidneys not working 100%" (06/30/2015)  . Seizure ([place])   . Sickle cell trait ([place])   . Systemic lupus ([place])   . Thoracic aortic atherosclerosis ([place]) 01/14/2017  . Type II diabetes mellitus ([place])   . Unspecified vitamin D deficiency     ASSESSMENT  Recent Results: The most recent result is correlated with 8.75 mg per week:  Lab Results  Component Value Date   INR 2.0 01/23/2020   INR 2.8 01/16/2020   INR 6.4  (A) 01/09/2020    Anticoagulation Dosing: Description   INR at goal. Continue with 1.25 mg daily. Recheck INR in 1 week      INR today: Therapeutic. Continues to remain therapeutic on current dose. Recent episodes of epistaxis likely due to dry nasal mucus member in setting of continuous O2 nasal canula use. Recommended pt keep her nasal membrane moist, advised pt to try saline nasal spray or dehumidifier. No recurrence of bleeding episode since. Reviewed and provider pt with Nosebleed management patient education handout. Will continue close monitoring and follow up.   Pt noted to have patient related factors that would make her more sensitive to warfarin (Hx of CHF, poor renal function, frail and elderly).    PLAN  Weekly dose was unchanged to 8.75 mg/weekly. Continue taking 1.25 mg daily. Recheck INR in 1 week.   Patient Instructions  INSTRUCTIONS: INR at goal. Continue with 1.25 mg daily. Recheck INR in 1 week  Nosebleed, Adult A nosebleed is when blood comes out of the nose. Nosebleeds are common. Usually, they are not a sign of a serious condition. Nosebleeds can happen if a small blood vessel in your nose starts to bleed or if the lining of your nose (mucous membrane) cracks. They are commonly caused by:  Allergies.  Colds.  Picking your nose.  Blowing your nose too hard.  An injury from sticking an object into your nose or getting hit in the nose.  Dry or cold air. Less common causes of nosebleeds include:  Toxic fumes.  Something abnormal in the nose or in the air-filled spaces in the bones of the face (sinuses).  Growths in the nose, such as polyps.  Medicines or conditions that cause blood to clot slowly.  Certain illnesses or procedures that irritate or dry out the nasal passages. Follow these instructions at home: When you have a nosebleed:         Sit down and tilt your head slightly  forward.  Use a clean towel or tissue to pinch your nostrils  under the bony part of your nose. After 10 minutes, let go of your nose and see if bleeding starts again. Do not release pressure before that time. If there is still bleeding, repeat the pinching and holding for 10 minutes until the bleeding stops.  Do not place tissues or gauze in the nose to stop bleeding.  Avoid lying down and avoid tilting your head backward. That may make blood collect in the throat and cause gagging or coughing.  Use a nasal spray decongestant to help with a nosebleed as told by your health care provider.  Do not use petroleum jelly or mineral oil in your nose. It can drip into your lungs. After a nosebleed:  Avoid blowing your nose or sniffing for a number of hours.  Avoid straining, lifting, or bending at the waist for several days. You may resume other normal activities as you are able.  Use saline spray or a humidifier as told by your health care provider.  Aspirinand blood thinners make bleeding more likely. If you are prescribed these medicines and you suffer from nosebleeds: ? Ask your health care provider if you should stop taking the medicines or if you should adjust the dose. ? Do not stop taking medicines that your health care provider has recommended unless told by your health care provider.  If your nosebleed was caused by dry mucous membranes, use over-the-counter saline nasal spray or gel. This will keep the mucous membranes moist and allow them to heal. If you must use a lubricant: ? Choose one that is water-soluble. ? Use only as much as you need and use it only as often as needed. ? Do not lie down until several hours after you use it. Contact a health care provider if:  You have a fever.  You get nosebleeds often or more often than usual.  You bruise very easily.  You have a nosebleed from having something stuck in your nose.  You have bleeding in your mouth.  You vomit or cough up brown material.  You have a nosebleed after you start  a new medicine. Get help right away if:  You have a nosebleed after a fall or a head injury.  Your nosebleed does not go away after 20 minutes.  You feel dizzy or weak.  You have unusual bleeding from other parts of your body.  You have unusual bruising on other parts of your body.  You become sweaty.  You vomit blood. This information is not intended to replace advice given to you by your health care provider. Make sure you discuss any questions you have with your health care provider. Document Revised: 09/20/2017 Document Reviewed: 01/06/2016 Elsevier Patient Education  [place].   Patient advised to contact clinic or seek medical attention if signs/symptoms of bleeding or thromboembolism occur.  Patient verbalized understanding by repeating back information and was advised to contact me if further medication-related questions arise.   Follow-up Return in about 1 week (around 01/30/2020).  Alysia Penna, PharmD  15 minutes spent face-to-face with the patient during the encounter. 50% of time spent on education, including signs/sx bleeding and clotting, as well as food and drug interactions with warfarin. 50% of time was spent on fingerprick POC INR sample collection,processing, results determination, and documentation

## 2020-01-28 ENCOUNTER — Telehealth: Payer: Self-pay | Admitting: Family Medicine

## 2020-01-28 NOTE — Telephone Encounter (Signed)
Pts daughter is calling in stating that the pts iron is extremely low and her kidney doctor is talking about iron infusion and daughter is wanting to know if they can double up on her iron medication or if there is something else that the pt can take for the low iron.  Pts daughter would like to have a call back.

## 2020-01-29 NOTE — Telephone Encounter (Signed)
Pts daughter Anne Ng) was calling to check the status of the below msg.  She is aware that we ask for 24-48 hrs for a reply back from the providers.

## 2020-01-29 NOTE — Telephone Encounter (Signed)
Her anemia most likely is a combination of chronic disease and iron def. She is on Fe Sulfate 325 mg daily, she could increase it up to bid. The problem is that medication could aggravate constipation. Thanks, BJ

## 2020-01-29 NOTE — Telephone Encounter (Signed)
I spoke with pt's daughter. They will increase pt's iron medication to twice a day, advised caution with constipation & they will f/u in office on 8/18.

## 2020-01-30 ENCOUNTER — Ambulatory Visit: Payer: Medicare Other | Admitting: Pharmacist

## 2020-01-30 ENCOUNTER — Other Ambulatory Visit: Payer: Self-pay

## 2020-01-30 DIAGNOSIS — I513 Intracardiac thrombosis, not elsewhere classified: Secondary | ICD-10-CM

## 2020-01-30 DIAGNOSIS — Z5181 Encounter for therapeutic drug level monitoring: Secondary | ICD-10-CM

## 2020-01-30 DIAGNOSIS — Z7901 Long term (current) use of anticoagulants: Secondary | ICD-10-CM | POA: Diagnosis not present

## 2020-01-30 LAB — POCT INR: INR: 2.5 (ref 2.0–3.0)

## 2020-01-30 NOTE — Patient Instructions (Signed)
INR at goal. Continue with 1.25 mg daily. Recheck INR in 2 week

## 2020-01-30 NOTE — Progress Notes (Signed)
Anticoagulation Management Brittany Roberts is a 79 y.o. female who reports to the clinic for monitoring of warfarin treatment.    Indication: LV Thrombus  Duration: 3 months (expected end date 03/2020) Supervising physician: [facility] Visit History:  Patient does not report signs/symptoms of bleeding or thromboembolism. Mild episode epistaxis x1 last Friday. No recurrence since. Encouraged pt to start saline nasal spray/dehumidifier.   Other recent changes: No change in diet, medications, lifestyle. Pt had 1 serving of cabbage and 2 servings of turnip greens last week. Pt continues to be stable on 3L of O2. Working with PT   Anticoagulation Episode Summary    Current INR goal:  2.0-3.0  TTR:  41.6 % (1.1 mo)  Next INR check:  02/13/2020  INR from last check:  2.5 (01/30/2020)  Weekly max warfarin dose:    Target end date:  03/14/2020  INR check location:    Preferred lab:    Send INR reminders to:     Indications   LV (left ventricular) mural thrombus [I51.3] Monitoring for long-term anticoagulant use [Z51.81 Z79.01]       Comments:        Anticoagulation Care Providers    Provider Role Specialty Phone number   Nigel Mormon, MD  Cardiology [phone]      Allergies  Allergen Reactions  . Ace Inhibitors Other (See Comments)    Coincided with sig bump in creat. Retried and creat bumped again.   . Isosorb Dinitrate-Hydralazine Other (See Comments)    Sleep all the time.   Marland Kitchen Penicillins Anaphylaxis, Swelling and Rash    Has patient had a PCN reaction causing immediate rash, facial/tongue/throat swelling, SOB or lightheadedness with hypotension: Yes Has patient had a PCN reaction causing severe rash involving mucus membranes or skin necrosis: Yes Has patient had a PCN reaction that required hospitalization: Yes Has patient had a PCN reaction occurring within the last 10 years: No If all of the above answers are "NO", then may proceed  with Cephalosporin use.   Marland Kitchen Buspirone Other (See Comments)    pain   . Pregabalin Swelling  . Ropinirole Hydrochloride Swelling  . Amantadines Rash    "Swelling of the tongue"    Current Outpatient Medications:  .  Accu-Chek Softclix Lancets lancets, Use as instructed, Disp: 100 each, Rfl: 3 .  acetaminophen (TYLENOL) 500 MG tablet, Take 500 mg by mouth every 6 (six) hours as needed for moderate pain., Disp: , Rfl:  .  allopurinol (ZYLOPRIM) 100 MG tablet, Take 2 tablets (200 mg total) by mouth daily., Disp: 180 tablet, Rfl: 1 .  amLODipine (NORVASC) 5 MG tablet, Take 1 tablet (5 mg total) by mouth daily. (Patient taking differently: Take 2.5 mg by mouth daily. ), Disp: 90 tablet, Rfl: 3 .  Ascorbic Acid (VITAMIN C PO), Take 1 tablet by mouth daily., Disp: , Rfl:  .  atorvastatin (LIPITOR) 20 MG tablet, TAKE 1 TABLET(20 MG) BY MOUTH DAILY, Disp: 90 tablet, Rfl: 0 .  B Complex Vitamins (VITAMIN B COMPLEX) TABS, Take 1 tablet by mouth daily., Disp: , Rfl:  .  budesonide-formoterol (SYMBICORT) 80-4.5 MCG/ACT inhaler, Inhale 2 puffs into the lungs 2 (two) times daily., Disp: 1 Inhaler, Rfl: 3 .  Cholecalciferol (VITAMIN D3) 5000 UNITS CAPS, Take 5,000 Units by mouth daily., Disp: , Rfl:  .  DULoxetine (CYMBALTA) 60 MG capsule, Take 1 capsule (60 mg total) by mouth daily., Disp: 90 capsule, Rfl: 2 .  ferrous  sulfate 325 (65 FE) MG tablet, Take 325 mg by mouth daily with breakfast., Disp: , Rfl:  .  Fluocinolone Acetonide Scalp 0.01 % OIL, Apply 1 application topically at bedtime as needed (itching scalp). , Disp: , Rfl:  .  gabapentin (NEURONTIN) 100 MG capsule, Take 1 capsule (100 mg total) by mouth 2 (two) times daily., Disp: 60 capsule, Rfl: 3 .  glucose blood (ACCU-CHEK AVIVA PLUS) test strip, Use to test blood sugar three times daily, Disp: 100 each, Rfl: 3 .  guaiFENesin-dextromethorphan (ROBITUSSIN DM) 100-10 MG/5ML syrup, Take 5 mLs by mouth every 4 (four) hours as needed for cough.,  Disp: 118 mL, Rfl: 0 .  Insulin NPH, Human,, Isophane, (HUMULIN N KWIKPEN) 100 UNIT/ML Kiwkpen, 10 Units am and 10 Units pm, Disp: , Rfl:  .  Insulin Pen Needle (B-D ULTRAFINE III SHORT PEN) 31G X 8 MM MISC, CHECK BLOOD SUGAR 3 TIMES A DAY, Disp: 100 each, Rfl: 3 .  isosorbide mononitrate (IMDUR) 30 MG 24 hr tablet, Take 1 tablet (30 mg total) by mouth daily., Disp: 90 tablet, Rfl: 3 .  levETIRAcetam (KEPPRA) 500 MG tablet, Take 1 tablet (500 mg total) by mouth 2 (two) times daily., Disp: 180 tablet, Rfl: 3 .  linaclotide (LINZESS) 145 MCG CAPS capsule, Take 1 capsule (145 mcg total) by mouth daily as needed (abdominal cramping)., Disp: , Rfl:  .  memantine (NAMENDA) 10 MG tablet, Take 1 tablet (10 mg total) by mouth 2 (two) times daily., Disp: 60 tablet, Rfl: 11 .  metoprolol succinate (TOPROL-XL) 50 MG 24 hr tablet, TAKE 1 TABLET BY MOUTH EVERY DAY. TAKE WITH OR IMMEDIATELY FOLLOWING A MEAL, Disp: 90 tablet, Rfl: 1 .  montelukast (SINGULAIR) 10 MG tablet, Take 1 tablet (10 mg total) by mouth at bedtime., Disp: 90 tablet, Rfl: 3 .  Omega-3 Fatty Acids (FISH OIL) 1000 MG CAPS, Take 1,000 mg by mouth daily. , Disp: , Rfl:  .  pantoprazole (PROTONIX) 40 MG tablet, TAKE 1 TABLET(40 MG) BY MOUTH DAILY (Patient taking differently: Take 40 mg by mouth daily. ), Disp: 90 tablet, Rfl: 3 .  Polyethyl Glycol-Propyl Glycol (SYSTANE) 0.4-0.3 % GEL ophthalmic gel, Place 1 application into both eyes every 4 (four) hours as needed (dry eyes)., Disp: , Rfl:  .  PROAIR HFA 108 (90 Base) MCG/ACT inhaler, INHALE 2 PUFF BY MOUTH EVERY 4 HOURS AS NEEDED USE ONLY IF YOU ARE WHEEZING (Patient taking differently: Inhale 2 puffs into the lungs every 4 (four) hours as needed for wheezing. ), Disp: 8.5 g, Rfl: 3 .  Spacer/Aero-Holding Chambers (AEROCHAMBER PLUS) inhaler, Use as instructed to use with inahaler., Disp: 1 each, Rfl: 1 .  torsemide (DEMADEX) 20 MG tablet, Take 1 tablet (20 mg total) by mouth 2 (two) times daily.,  Disp: 60 tablet, Rfl: 2 .  warfarin (COUMADIN) 2.5 MG tablet, Take 1 tablet by mouth daily as directed. Refer to most recent anticoagulation note for most accurate dosing instructions., Disp: 90 tablet, Rfl: 3 .  warfarin (COUMADIN) 5 MG tablet, Take 1.5 tablets (7.5 mg total) by mouth daily., Disp: 45 tablet, Rfl: 3 Past Medical History:  Diagnosis Date  . Acute delirium 01/14/2017  . Anxiety   . Arthritis    "all over"  . Asthma   . Bipolar disorder ([place])   . Complication of anesthesia    "w/right foot OR they gave me too much and they couldn't get me woke"  . Congestive heart failure, unspecified   . Coronary  artery calcification seen on CAT scan 01/14/2017  . Coronary artery disease    "I've got 1 stent" (06/30/2015)  . Depression   . Discoid lupus   . Fibromyalgia    "I've been told I have this; can't take Lyrica cause I'm allergic to it" (06/30/2015)  . GERD (gastroesophageal reflux disease)   . History of gout   . History of hiatal hernia    "real bad" (06/30/2015)  . Hypertension   . Hypothyroidism   . Memory loss   . On home oxygen therapy    "2L q hs" (06/30/2015)  . OSA (obstructive sleep apnea)    "just use my oxygen; no mask" (06/30/2015)  . Other and unspecified hyperlipidemia   . Penetrating foot wound    left nonhealing foot wound on the dorsal surface  . Peripheral neuropathy   . Pneumonia "many of times"  . Refusal of blood transfusions as patient is Jehovah's Witness   . Renal failure, unspecified    "kidneys not working 100%" (06/30/2015)  . Seizure ([place])   . Sickle cell trait ([place])   . Systemic lupus ([place])   . Thoracic aortic atherosclerosis ([place]) 01/14/2017  . Type II diabetes mellitus ([place])   . Unspecified vitamin D deficiency     ASSESSMENT  Recent Results: The most recent result is correlated with 8.75 mg per week:  Lab Results  Component Value Date   INR 2.5 01/30/2020   INR 2.0 01/23/2020   INR 2.8 01/16/2020    Anticoagulation  Dosing: Description   INR at goal. Continue with 1.25 mg daily. Recheck INR in 2 week      INR today: Therapeutic. Continues to remain therapeutic on current dose. Recent episodes of epistaxis likely due to dry nasal mucus member in setting of continuous O2 nasal canula use. Recommended pt keep her nasal membrane moist, advised pt to try saline nasal spray or dehumidifier. No recurrence of bleeding episode since. Reviewed and provider pt with Nosebleed management patient education handout. Will continue close monitoring and follow up.   Pt noted to have patient related factors that would make her more sensitive to warfarin (Hx of CHF, poor renal function, frail and elderly).    PLAN  Weekly dose was unchanged to 8.75 mg/weekly. Continue taking 1.25 mg daily. Recheck INR in 1 week.   Patient Instructions  INR at goal. Continue with 1.25 mg daily. Recheck INR in 2 week  Patient advised to contact clinic or seek medical attention if signs/symptoms of bleeding or thromboembolism occur.  Patient verbalized understanding by repeating back information and was advised to contact me if further medication-related questions arise.   Follow-up Return in about 2 weeks (around 02/13/2020).  Alysia Penna, PharmD  15 minutes spent face-to-face with the patient during the encounter. 50% of time spent on education, including signs/sx bleeding and clotting, as well as food and drug interactions with warfarin. 50% of time was spent on fingerprick POC INR sample collection,processing, results determination, and documentation

## 2020-02-04 ENCOUNTER — Other Ambulatory Visit: Payer: Self-pay | Admitting: Family Medicine

## 2020-02-04 ENCOUNTER — Other Ambulatory Visit: Payer: Self-pay | Admitting: Cardiology

## 2020-02-04 ENCOUNTER — Encounter (HOSPITAL_COMMUNITY): Payer: Medicare Other

## 2020-02-04 DIAGNOSIS — R079 Chest pain, unspecified: Secondary | ICD-10-CM

## 2020-02-06 DIAGNOSIS — I5032 Chronic diastolic (congestive) heart failure: Secondary | ICD-10-CM | POA: Diagnosis not present

## 2020-02-06 DIAGNOSIS — J45909 Unspecified asthma, uncomplicated: Secondary | ICD-10-CM | POA: Diagnosis not present

## 2020-02-06 DIAGNOSIS — I10 Essential (primary) hypertension: Secondary | ICD-10-CM | POA: Diagnosis not present

## 2020-02-07 ENCOUNTER — Encounter (HOSPITAL_COMMUNITY): Payer: Medicare Other

## 2020-02-12 DIAGNOSIS — M25869 Other specified joint disorders, unspecified knee: Secondary | ICD-10-CM | POA: Diagnosis not present

## 2020-02-12 DIAGNOSIS — I10 Essential (primary) hypertension: Secondary | ICD-10-CM | POA: Diagnosis not present

## 2020-02-12 DIAGNOSIS — J45909 Unspecified asthma, uncomplicated: Secondary | ICD-10-CM | POA: Diagnosis not present

## 2020-02-13 ENCOUNTER — Ambulatory Visit: Payer: Medicare Other | Admitting: Pharmacist

## 2020-02-13 ENCOUNTER — Other Ambulatory Visit: Payer: Self-pay

## 2020-02-13 DIAGNOSIS — Z5181 Encounter for therapeutic drug level monitoring: Secondary | ICD-10-CM

## 2020-02-13 DIAGNOSIS — I513 Intracardiac thrombosis, not elsewhere classified: Secondary | ICD-10-CM | POA: Diagnosis not present

## 2020-02-13 DIAGNOSIS — Z7901 Long term (current) use of anticoagulants: Secondary | ICD-10-CM | POA: Diagnosis not present

## 2020-02-13 LAB — POCT INR: INR: 2.5 (ref 2.0–3.0)

## 2020-02-13 NOTE — Patient Instructions (Signed)
INR at goal. Continue with 1.25 mg daily. Recheck INR in 4 week

## 2020-02-13 NOTE — Progress Notes (Signed)
Anticoagulation Management Brittany Roberts is a 79 y.o. female who reports to the clinic for monitoring of warfarin treatment.    Indication: LV Thrombus  Duration: 3 months (expected end date 03/2020) Supervising physician: [facility] Visit History:  Patient does not report signs/symptoms of bleeding or thromboembolism. Episode mild epistaxis x1. No recurrence since.   Other recent changes: No change in diet, medications, lifestyle. Pt had 1 serving of cabbage and 2 servings of turnip greens last week. Pt continues to be stable on 3L of O2. Working with PT   Anticoagulation Episode Summary    Current INR goal:  2.0-3.0  TTR:  58.4 % (1.6 mo)  Next INR check:  03/13/2020  INR from last check:  2.5 (02/13/2020)  Weekly max warfarin dose:    Target end date:  03/14/2020  INR check location:    Preferred lab:    Send INR reminders to:     Indications   LV (left ventricular) mural thrombus [I51.3] Monitoring for long-term anticoagulant use [Z51.81 Z79.01]       Comments:        Anticoagulation Care Providers    Provider Role Specialty Phone number   Nigel Mormon, MD  Cardiology [phone]      Allergies  Allergen Reactions  . Ace Inhibitors Other (See Comments)    Coincided with sig bump in creat. Retried and creat bumped again.   . Isosorb Dinitrate-Hydralazine Other (See Comments)    Sleep all the time.   Marland Kitchen Penicillins Anaphylaxis, Swelling and Rash    Has patient had a PCN reaction causing immediate rash, facial/tongue/throat swelling, SOB or lightheadedness with hypotension: Yes Has patient had a PCN reaction causing severe rash involving mucus membranes or skin necrosis: Yes Has patient had a PCN reaction that required hospitalization: Yes Has patient had a PCN reaction occurring within the last 10 years: No If all of the above answers are "NO", then may proceed with Cephalosporin use.   Marland Kitchen Buspirone Other (See Comments)     pain   . Pregabalin Swelling  . Ropinirole Hydrochloride Swelling  . Amantadines Rash    "Swelling of the tongue"    Current Outpatient Medications:  .  Accu-Chek Softclix Lancets lancets, Use as instructed, Disp: 100 each, Rfl: 3 .  acetaminophen (TYLENOL) 500 MG tablet, Take 500 mg by mouth every 6 (six) hours as needed for moderate pain., Disp: , Rfl:  .  allopurinol (ZYLOPRIM) 100 MG tablet, Take 2 tablets (200 mg total) by mouth daily., Disp: 180 tablet, Rfl: 1 .  amLODipine (NORVASC) 5 MG tablet, Take 1 tablet (5 mg total) by mouth daily. (Patient taking differently: Take 2.5 mg by mouth daily. ), Disp: 90 tablet, Rfl: 3 .  Ascorbic Acid (VITAMIN C PO), Take 1 tablet by mouth daily., Disp: , Rfl:  .  atorvastatin (LIPITOR) 20 MG tablet, TAKE 1 TABLET(20 MG) BY MOUTH DAILY, Disp: 90 tablet, Rfl: 0 .  B Complex Vitamins (VITAMIN B COMPLEX) TABS, Take 1 tablet by mouth daily., Disp: , Rfl:  .  budesonide-formoterol (SYMBICORT) 80-4.5 MCG/ACT inhaler, Inhale 2 puffs into the lungs 2 (two) times daily., Disp: 1 Inhaler, Rfl: 3 .  Cholecalciferol (VITAMIN D3) 5000 UNITS CAPS, Take 5,000 Units by mouth daily., Disp: , Rfl:  .  DULoxetine (CYMBALTA) 60 MG capsule, Take 1 capsule (60 mg total) by mouth daily., Disp: 90 capsule, Rfl: 2 .  ferrous sulfate 325 (65 FE) MG tablet, Take 325 mg  by mouth daily with breakfast., Disp: , Rfl:  .  Fluocinolone Acetonide Scalp 0.01 % OIL, Apply 1 application topically at bedtime as needed (itching scalp). , Disp: , Rfl:  .  gabapentin (NEURONTIN) 100 MG capsule, Take 1 capsule (100 mg total) by mouth 2 (two) times daily., Disp: 60 capsule, Rfl: 3 .  glucose blood (ACCU-CHEK AVIVA PLUS) test strip, Use to test blood sugar three times daily, Disp: 100 each, Rfl: 3 .  guaiFENesin-dextromethorphan (ROBITUSSIN DM) 100-10 MG/5ML syrup, Take 5 mLs by mouth every 4 (four) hours as needed for cough., Disp: 118 mL, Rfl: 0 .  Insulin NPH, Human,, Isophane, (HUMULIN N  KWIKPEN) 100 UNIT/ML Kiwkpen, ADMINISTER 30 UNITS UNDER THE SKIN EVERY MORNING, Disp: 30 mL, Rfl: 2 .  Insulin Pen Needle (B-D ULTRAFINE III SHORT PEN) 31G X 8 MM MISC, CHECK BLOOD SUGAR 3 TIMES A DAY, Disp: 100 each, Rfl: 3 .  isosorbide mononitrate (IMDUR) 30 MG 24 hr tablet, TAKE 1 TABLET(30 MG) BY MOUTH DAILY, Disp: 90 tablet, Rfl: 3 .  levETIRAcetam (KEPPRA) 500 MG tablet, Take 1 tablet (500 mg total) by mouth 2 (two) times daily., Disp: 180 tablet, Rfl: 3 .  linaclotide (LINZESS) 145 MCG CAPS capsule, Take 1 capsule (145 mcg total) by mouth daily as needed (abdominal cramping)., Disp: , Rfl:  .  memantine (NAMENDA) 10 MG tablet, Take 1 tablet (10 mg total) by mouth 2 (two) times daily., Disp: 60 tablet, Rfl: 11 .  metoprolol succinate (TOPROL-XL) 50 MG 24 hr tablet, TAKE 1 TABLET BY MOUTH EVERY DAY. TAKE WITH OR IMMEDIATELY FOLLOWING A MEAL, Disp: 90 tablet, Rfl: 1 .  montelukast (SINGULAIR) 10 MG tablet, Take 1 tablet (10 mg total) by mouth at bedtime., Disp: 90 tablet, Rfl: 3 .  Omega-3 Fatty Acids (FISH OIL) 1000 MG CAPS, Take 1,000 mg by mouth daily. , Disp: , Rfl:  .  pantoprazole (PROTONIX) 40 MG tablet, TAKE 1 TABLET(40 MG) BY MOUTH DAILY (Patient taking differently: Take 40 mg by mouth daily. ), Disp: 90 tablet, Rfl: 3 .  Polyethyl Glycol-Propyl Glycol (SYSTANE) 0.4-0.3 % GEL ophthalmic gel, Place 1 application into both eyes every 4 (four) hours as needed (dry eyes)., Disp: , Rfl:  .  PROAIR HFA 108 (90 Base) MCG/ACT inhaler, INHALE 2 PUFF BY MOUTH EVERY 4 HOURS AS NEEDED USE ONLY IF YOU ARE WHEEZING (Patient taking differently: Inhale 2 puffs into the lungs every 4 (four) hours as needed for wheezing. ), Disp: 8.5 g, Rfl: 3 .  Spacer/Aero-Holding Chambers (AEROCHAMBER PLUS) inhaler, Use as instructed to use with inahaler., Disp: 1 each, Rfl: 1 .  torsemide (DEMADEX) 20 MG tablet, Take 1 tablet (20 mg total) by mouth 2 (two) times daily., Disp: 60 tablet, Rfl: 2 .  warfarin (COUMADIN)  2.5 MG tablet, Take 1 tablet by mouth daily as directed. Refer to most recent anticoagulation note for most accurate dosing instructions., Disp: 90 tablet, Rfl: 3 .  warfarin (COUMADIN) 5 MG tablet, Take 1.5 tablets (7.5 mg total) by mouth daily., Disp: 45 tablet, Rfl: 3 Past Medical History:  Diagnosis Date  . Acute delirium 01/14/2017  . Anxiety   . Arthritis    "all over"  . Asthma   . Bipolar disorder ([place])   . Complication of anesthesia    "w/right foot OR they gave me too much and they couldn't get me woke"  . Congestive heart failure, unspecified   . Coronary artery calcification seen on CAT scan 01/14/2017  .  Coronary artery disease    "I've got 1 stent" (06/30/2015)  . Depression   . Discoid lupus   . Fibromyalgia    "I've been told I have this; can't take Lyrica cause I'm allergic to it" (06/30/2015)  . GERD (gastroesophageal reflux disease)   . History of gout   . History of hiatal hernia    "real bad" (06/30/2015)  . Hypertension   . Hypothyroidism   . Memory loss   . On home oxygen therapy    "2L q hs" (06/30/2015)  . OSA (obstructive sleep apnea)    "just use my oxygen; no mask" (06/30/2015)  . Other and unspecified hyperlipidemia   . Penetrating foot wound    left nonhealing foot wound on the dorsal surface  . Peripheral neuropathy   . Pneumonia "many of times"  . Refusal of blood transfusions as patient is Jehovah's Witness   . Renal failure, unspecified    "kidneys not working 100%" (06/30/2015)  . Seizure ([place])   . Sickle cell trait ([place])   . Systemic lupus ([place])   . Thoracic aortic atherosclerosis ([place]) 01/14/2017  . Type II diabetes mellitus ([place])   . Unspecified vitamin D deficiency     ASSESSMENT  Recent Results: The most recent result is correlated with 8.75 mg per week:  Lab Results  Component Value Date   INR 2.5 02/13/2020   INR 2.5 01/30/2020   INR 2.0 01/23/2020    Anticoagulation Dosing: Description   INR at goal. Continue with  1.25 mg daily. Recheck INR in 4 week      INR today: Therapeutic. Continues to remain therapeutic on current dose. Denies any complains of bleeding or bruising symptoms. Denies any other relevant changes in her diet, medications, or lifestyle.    Pt noted to have patient related factors that would make her more sensitive to warfarin (Hx of CHF, poor renal function, frail and elderly).    PLAN  Weekly dose was unchanged to 8.75 mg/weekly. Continue taking 1.25 mg daily. Recheck INR in 4 week.   Patient Instructions  INR at goal. Continue with 1.25 mg daily. Recheck INR in 4 week  Patient advised to contact clinic or seek medical attention if signs/symptoms of bleeding or thromboembolism occur.  Patient verbalized understanding by repeating back information and was advised to contact me if further medication-related questions arise.   Follow-up Return in about 29 days (around 03/13/2020).  Alysia Penna, PharmD  15 minutes spent face-to-face with the patient during the encounter. 50% of time spent on education, including signs/sx bleeding and clotting, as well as food and drug interactions with warfarin. 50% of time was spent on fingerprick POC INR sample collection,processing, results determination, and documentation

## 2020-02-18 ENCOUNTER — Telehealth: Payer: Self-pay | Admitting: *Deleted

## 2020-02-18 DIAGNOSIS — M109 Gout, unspecified: Secondary | ICD-10-CM

## 2020-02-18 MED ORDER — ALLOPURINOL 100 MG PO TABS: 200.0000 mg | ORAL_TABLET | Freq: Every day | ORAL | 1 refills | Status: DC

## 2020-02-18 NOTE — Telephone Encounter (Signed)
ATC x 1, need to know which medication.

## 2020-02-18 NOTE — Telephone Encounter (Signed)
Brittany Roberts called the after hours line on 02/15/2020. Brittany Roberts reports they need a medication refill. This can wait until Monday.

## 2020-02-18 NOTE — Telephone Encounter (Signed)
Spoke with pt's daughter. Rx sent in for allopurinol to be taken twice daily as per updated medication list. Pharmacy will notify family when Rx is ready for pick up.

## 2020-02-20 ENCOUNTER — Other Ambulatory Visit: Payer: Self-pay

## 2020-02-20 ENCOUNTER — Ambulatory Visit (INDEPENDENT_AMBULATORY_CARE_PROVIDER_SITE_OTHER): Payer: Medicare Other | Admitting: Family Medicine

## 2020-02-20 ENCOUNTER — Encounter: Payer: Self-pay | Admitting: Family Medicine

## 2020-02-20 DIAGNOSIS — D509 Iron deficiency anemia, unspecified: Secondary | ICD-10-CM

## 2020-02-20 DIAGNOSIS — E1140 Type 2 diabetes mellitus with diabetic neuropathy, unspecified: Secondary | ICD-10-CM | POA: Diagnosis not present

## 2020-02-20 DIAGNOSIS — Z9981 Dependence on supplemental oxygen: Secondary | ICD-10-CM

## 2020-02-20 DIAGNOSIS — M109 Gout, unspecified: Secondary | ICD-10-CM

## 2020-02-20 DIAGNOSIS — J9611 Chronic respiratory failure with hypoxia: Secondary | ICD-10-CM

## 2020-02-20 DIAGNOSIS — I110 Hypertensive heart disease with heart failure: Secondary | ICD-10-CM

## 2020-02-20 DIAGNOSIS — Z794 Long term (current) use of insulin: Secondary | ICD-10-CM | POA: Diagnosis not present

## 2020-02-20 DIAGNOSIS — F321 Major depressive disorder, single episode, moderate: Secondary | ICD-10-CM

## 2020-02-20 DIAGNOSIS — M797 Fibromyalgia: Secondary | ICD-10-CM

## 2020-02-20 MED ORDER — HUMULIN N KWIKPEN 100 UNIT/ML ~~LOC~~ SUPN: PEN_INJECTOR | SUBCUTANEOUS | 2 refills | Status: DC

## 2020-02-20 NOTE — Patient Instructions (Addendum)
A few things to remember from today's visit:   We will adjust iron and magnesium if needed. No changes in medications today. PT will be arranged. Continue Cymbalta for arthritis.  If you need refills please call your pharmacy. Do not use My Chart to request refills or for acute issues that need immediate attention.    Please be sure medication list is accurate. If a new problem present, please set up appointment sooner than planned today.   [place] 4.0   (7)  Assisted living facility [address]  [phone] Open 24 hours Website Directions [facility] 5.0   (1)  Assisted living facility [place]  [phone] Open 24 hours  "[facility] is a new senior living community in a ..." Website Directions [facility] 3.9   (16)  Assisted living facility [place]  [phone] Open 24 hours

## 2020-02-20 NOTE — Progress Notes (Signed)
HPI: Brittany Roberts is a 79 y.o. female, who is here today with her daughter for chronic disease management. She was last seen on 12/11/2019. Since her last visit she has seen cardiologist.  She is on O2 supplementation 3 LPM. She does not use it it sitting. She does not have SOB while having O2, exertional dyspnea when she is not on O2 supplementation. Pulse Ox 91-98%, with and w/o supplemental O2.  Hypertension: Currently she is on amlodipine 5 mg daily, is also by mononitrate 30 mg daily, and metoprolol succinate 50 mg daily. She has not noted unusual headache,CP,palpitations,or worsening edema.  CKD 3: She has not noticed foamy urine, no hematuria, or decreased urine output. Nephrologist appt is coming, 03/2020. BP readings at home:Most 120-130's/60-70's. A few SBP in the low 100's and 150's. DBP 50's.  Lab Results  Component Value Date   CREATININE 1.90 (H) 12/11/2019   BUN 44 (H) 12/11/2019   NA 137 12/11/2019   K 3.8 12/11/2019   CL 100 12/11/2019   CO2 28 12/11/2019   Iron deficiency anemia: She is on Fe sulfate 325 mg bid. She has not noted blood in stool or melena. History of constipation.  Lab Results  Component Value Date   WBC 15.1 (H) 12/11/2019   HGB 10.4 (L) 12/11/2019   HCT 32.2 (L) 12/11/2019   MCV 77.6 (L) 12/11/2019   PLT 236.0 12/11/2019   Chronic anticoagulation: Indication: Left ventricle thrombus. Eliquis will discontinue. Currently she is on Coumadin 2.5 mg daily. She follows with Coumadin clinic.  DM II: BS's "up and down", today 240. Currently she is on Humulin and 10 units twice daily. Negative for hypoglycemic events. She has not been very consistent with following dietary recommendations, it is hard for her given the fact she stays at home most of the day.  Lab Results  Component Value Date   HGBA1C 7.6 (H) 10/17/2019   She is considering moving to assisted living and wants to know about some options in the area. She lives  alone. Her daughters check on her periodically. She needs help with ADLs and has home health service arranged.  Hypomagnesemia: 12/07/19 Mg low at 1.2. 12/11/19: Mg 1.4 She is on magnesium supplementation.  Fibromyalgia and polyarthralgia: Currently she is on gabapentin 100 mg twice daily, dose was decreased during last hospitalization. Depression: She is on Cymbalta 60 mg daily. She feels like medication is helping.  Gout: She is on allopurinol 100 mg twice daily Gradual onset. No history of trauma. She uses a walker for transfer. For the past 2-3 weeks feet pain, left one has been red and swollen. +Stiffness.  Review of Systems  Constitutional: Positive for fatigue. Negative for activity change, appetite change and fever.  HENT: Negative for mouth sores, nosebleeds and sore throat.   Eyes: Negative for redness and visual disturbance.  Respiratory: Negative for cough and wheezing.   Gastrointestinal: Negative for abdominal pain, nausea and vomiting.       Negative for changes in bowel habits.  Genitourinary: Negative for hematuria.  Musculoskeletal: Positive for gait problem.  Allergic/Immunologic: Positive for environmental allergies.  Neurological: Negative for syncope, facial asymmetry and weakness.  Psychiatric/Behavioral: Negative for confusion and hallucinations. The patient is nervous/anxious.   Rest of ROS, see pertinent positives sand negatives in HPI  Current Outpatient Medications on File Prior to Visit  Medication Sig Dispense Refill  . Accu-Chek Softclix Lancets lancets Use as instructed 100 each 3  . acetaminophen (TYLENOL) 500  MG tablet Take 500 mg by mouth every 6 (six) hours as needed for moderate pain.    Marland Kitchen allopurinol (ZYLOPRIM) 100 MG tablet Take 2 tablets (200 mg total) by mouth daily. 180 tablet 1  . amLODipine (NORVASC) 5 MG tablet Take 1 tablet (5 mg total) by mouth daily. (Patient taking differently: Take 2.5 mg by mouth daily. ) 90 tablet 3  . Ascorbic  Acid (VITAMIN C PO) Take 1 tablet by mouth daily.    Marland Kitchen atorvastatin (LIPITOR) 20 MG tablet TAKE 1 TABLET(20 MG) BY MOUTH DAILY 90 tablet 0  . B Complex Vitamins (VITAMIN B COMPLEX) TABS Take 1 tablet by mouth daily.    . budesonide-formoterol (SYMBICORT) 80-4.5 MCG/ACT inhaler Inhale 2 puffs into the lungs 2 (two) times daily. 1 Inhaler 3  . Cholecalciferol (VITAMIN D3) 5000 UNITS CAPS Take 5,000 Units by mouth daily.    . DULoxetine (CYMBALTA) 60 MG capsule Take 1 capsule (60 mg total) by mouth daily. 90 capsule 2  . ferrous sulfate 325 (65 FE) MG tablet Take 325 mg by mouth daily with breakfast.    . Fluocinolone Acetonide Scalp 0.01 % OIL Apply 1 application topically at bedtime as needed (itching scalp).     . gabapentin (NEURONTIN) 100 MG capsule Take 1 capsule (100 mg total) by mouth 2 (two) times daily. 60 capsule 3  . glucose blood (ACCU-CHEK AVIVA PLUS) test strip Use to test blood sugar three times daily 100 each 3  . guaiFENesin-dextromethorphan (ROBITUSSIN DM) 100-10 MG/5ML syrup Take 5 mLs by mouth every 4 (four) hours as needed for cough. 118 mL 0  . Insulin Pen Needle (B-D ULTRAFINE III SHORT PEN) 31G X 8 MM MISC CHECK BLOOD SUGAR 3 TIMES A DAY 100 each 3  . isosorbide mononitrate (IMDUR) 30 MG 24 hr tablet TAKE 1 TABLET(30 MG) BY MOUTH DAILY 90 tablet 3  . levETIRAcetam (KEPPRA) 500 MG tablet Take 1 tablet (500 mg total) by mouth 2 (two) times daily. 180 tablet 3  . linaclotide (LINZESS) 145 MCG CAPS capsule Take 1 capsule (145 mcg total) by mouth daily as needed (abdominal cramping).    . memantine (NAMENDA) 10 MG tablet Take 1 tablet (10 mg total) by mouth 2 (two) times daily. 60 tablet 11  . metoprolol succinate (TOPROL-XL) 50 MG 24 hr tablet TAKE 1 TABLET BY MOUTH EVERY DAY. TAKE WITH OR IMMEDIATELY FOLLOWING A MEAL 90 tablet 1  . montelukast (SINGULAIR) 10 MG tablet Take 1 tablet (10 mg total) by mouth at bedtime. 90 tablet 3  . Omega-3 Fatty Acids (FISH OIL) 1000 MG CAPS Take  1,000 mg by mouth daily.     . pantoprazole (PROTONIX) 40 MG tablet TAKE 1 TABLET(40 MG) BY MOUTH DAILY (Patient taking differently: Take 40 mg by mouth daily. ) 90 tablet 3  . Polyethyl Glycol-Propyl Glycol (SYSTANE) 0.4-0.3 % GEL ophthalmic gel Place 1 application into both eyes every 4 (four) hours as needed (dry eyes).    Marland Kitchen PROAIR HFA 108 (90 Base) MCG/ACT inhaler INHALE 2 PUFF BY MOUTH EVERY 4 HOURS AS NEEDED USE ONLY IF YOU ARE WHEEZING (Patient taking differently: Inhale 2 puffs into the lungs every 4 (four) hours as needed for wheezing. ) 8.5 g 3  . Spacer/Aero-Holding Chambers (AEROCHAMBER PLUS) inhaler Use as instructed to use with inahaler. 1 each 1  . torsemide (DEMADEX) 20 MG tablet Take 1 tablet (20 mg total) by mouth 2 (two) times daily. 60 tablet 2  . warfarin (COUMADIN) 2.5  MG tablet Take 1 tablet by mouth daily as directed. Refer to most recent anticoagulation note for most accurate dosing instructions. 90 tablet 3  . warfarin (COUMADIN) 5 MG tablet Take 1.5 tablets (7.5 mg total) by mouth daily. 45 tablet 3   No current facility-administered medications on file prior to visit.   Past Medical History:  Diagnosis Date  . Acute delirium 01/14/2017  . Anxiety   . Arthritis    "all over"  . Asthma   . Bipolar disorder ([place])   . Complication of anesthesia    "w/right foot OR they gave me too much and they couldn't get me woke"  . Congestive heart failure, unspecified   . Coronary artery calcification seen on CAT scan 01/14/2017  . Coronary artery disease    "I've got 1 stent" (06/30/2015)  . Depression   . Discoid lupus   . Fibromyalgia    "I've been told I have this; can't take Lyrica cause I'm allergic to it" (06/30/2015)  . GERD (gastroesophageal reflux disease)   . History of gout   . History of hiatal hernia    "real bad" (06/30/2015)  . Hypertension   . Hypothyroidism   . Memory loss   . On home oxygen therapy    "2L q hs" (06/30/2015)  . OSA (obstructive sleep  apnea)    "just use my oxygen; no mask" (06/30/2015)  . Other and unspecified hyperlipidemia   . Penetrating foot wound    left nonhealing foot wound on the dorsal surface  . Peripheral neuropathy   . Pneumonia "many of times"  . Refusal of blood transfusions as patient is Jehovah's Witness   . Renal failure, unspecified    "kidneys not working 100%" (06/30/2015)  . Seizure ([place])   . Sickle cell trait ([place])   . Systemic lupus ([place])   . Thoracic aortic atherosclerosis ([place]) 01/14/2017  . Type II diabetes mellitus ([place])   . Unspecified vitamin D deficiency    Allergies  Allergen Reactions  . Ace Inhibitors Other (See Comments)    Coincided with sig bump in creat. Retried and creat bumped again.   . Isosorb Dinitrate-Hydralazine Other (See Comments)    Sleep all the time.   Marland Kitchen Penicillins Anaphylaxis, Swelling and Rash    Has patient had a PCN reaction causing immediate rash, facial/tongue/throat swelling, SOB or lightheadedness with hypotension: Yes Has patient had a PCN reaction causing severe rash involving mucus membranes or skin necrosis: Yes Has patient had a PCN reaction that required hospitalization: Yes Has patient had a PCN reaction occurring within the last 10 years: No If all of the above answers are "NO", then may proceed with Cephalosporin use.   Marland Kitchen Buspirone Other (See Comments)    pain   . Pregabalin Swelling  . Ropinirole Hydrochloride Swelling  . [place] Rash    "Swelling of the tongue"    Social History   Socioeconomic History  . Marital status: Single    Spouse name: Not on file  . Number of children: 4  . Years of education: 30 th  . Highest education level: Not on file  Occupational History  . Occupation: Retired- Engineer, production    Employer: RETIRED  Tobacco Use  . Smoking status: Former Smoker    Packs/day: 3.00    Years: 40.00    Pack years: 120.00    Types: Cigarettes    Start date: 07/05/1960    Quit date: 07/05/1998    Years  since  quitting: 21.6  . Smokeless tobacco: Never Used  Vaping Use  . Vaping Use: Never used  Substance and Sexual Activity  . Alcohol use: No    Alcohol/week: 0.0 standard drinks    Comment: havent drank in over 40 years  . Drug use: No  . Sexual activity: Never  Other Topics Concern  . Not on file  Social History Narrative   Health Care POA:    Emergency Contact: daughter, Lorenso Courier [phone] or [name] [phone]   End of Life Plan: Does not want to be ventilated or feeding tubes.    Who lives with you: Lives alone   Any pets: none   Diet: Patient reports enjoying and eating junk food.  Does not regulate types of food and currently her dentures are broken so it is hard to eat some foods.   Exercise: Patient does not have an exercise plan.   Seatbelts: Patient reports wearing seatbelt when in vehicle.   Sun Exposure/Protection: Patient reports not going outside very often.   Hobbies: Patient enjoys reading the bible and watching game shows.    Right-handed.   1 cup caffeine per day.   Former smoker-stopped 2000   Alcohol none   Social Determinants of Radio broadcast assistant Strain:   . Difficulty of Paying Living Expenses: Not on file  Food Insecurity:   . Worried About Charity fundraiser in the Last Year: Not on file  . Ran Out of Food in the Last Year: Not on file  Transportation Needs:   . Lack of Transportation (Medical): Not on file  . Lack of Transportation (Non-Medical): Not on file  Physical Activity:   . Days of Exercise per Week: Not on file  . Minutes of Exercise per Session: Not on file  Stress:   . Feeling of Stress : Not on file  Social Connections:   . Frequency of Communication with Friends and Family: Not on file  . Frequency of Social Gatherings with Friends and Family: Not on file  . Attends Religious Services: Not on file  . Active Member of Clubs or Organizations: Not on file  . Attends Archivist Meetings: Not on file  .  Marital Status: Not on file   Vitals:   02/20/20 1044  BP: 134/80  Pulse: (!) 107  Resp: 16  Temp: 98.9 F (37.2 C)  SpO2: 98%   Body mass index is 48.82 kg/m.  Physical Exam Vitals and nursing note reviewed.  Constitutional:      General: She is not in acute distress.    Appearance: She is well-developed.  HENT:     Head: Normocephalic and atraumatic.     Mouth/Throat:     Mouth: Mucous membranes are moist.  Eyes:     Conjunctiva/sclera: Conjunctivae normal.  Cardiovascular:     Rate and Rhythm: Normal rate and regular rhythm.     Heart sounds: No murmur heard.      Comments: DP pulses present. Pulmonary:     Effort: Pulmonary effort is normal. No respiratory distress.     Breath sounds: Normal breath sounds.  Abdominal:     Palpations: Abdomen is soft.     Tenderness: There is no abdominal tenderness.  Musculoskeletal:     Right foot: Bunion present.     Left foot: Bunion present.     Comments: In general no signs of synovitis. MTP joints: Some tender with palpation, no local heat,edema,or erythema.  Lymphadenopathy:     Cervical:  No cervical adenopathy.  Skin:    General: Skin is warm.     Findings: No erythema or rash.  Neurological:     Mental Status: She is alert and oriented to person, place, and time.     Cranial Nerves: No cranial nerve deficit.     Comments: Gait assisted with a walker.  Psychiatric:     Comments: Well groomed, good eye contact.    ASSESSMENT AND PLAN:  Brittany Roberts was seen today for chronic disease management..  Orders Placed This Encounter  Procedures  . CBC  . Fructosamine  . Hemoglobin A1c  . Iron  . Magnesium  . Amb Referral to Nutrition and Diabetic E   Lab Results  Component Value Date   HGBA1C 7.7 (H) 02/20/2020   Lab Results  Component Value Date   WBC 11.5 (H) 02/20/2020   HGB 10.7 (L) 02/20/2020   HCT 34.6 (L) 02/20/2020   MCV 82.8 02/20/2020   PLT 207 02/20/2020   Hypomagnesemia No changes in  current Mg supplementation dose, will adjust treatment if needed.  Type 2 diabetes mellitus with diabetic neuropathy, with long-term current use of insulin ([place]) HgA1C is not at goal. No changes in Humolin N dose for now, according to [facility] will adjust dose. Healthy diet with avoidance of added sugar food intake is an important part of treatment and recommended. Annual eye exam and foot care recommended. F/U in 4 months.  Hypertensive heart disease with heart failure ([place]) In general BP is adequately controled. Continue monitoring BP and low salt diet. No changes in current management.  Iron deficiency anemia, unspecified iron deficiency anemia type No changes in current management, will follow CBC done today and will give further recommendations accordingly.  Current moderate episode of major depressive disorder without prior episode ([place]) Problem seems to be stable. Continue Cymbalta 60 mg daily.  Gout, arthropathy Joint pain she is reporting most likely caused by OA. For now no changes in Allopurinol dose. Continue avoiding foods that could aggravate problem.  Fibromyalgia + Generalized OA. Cymbalta 60 mg to continue. Continue Gabapentin 100 mg bid. Fall precautions discussed.  Chronic respiratory failure with hypoxia, on home O2 therapy ([place]) She can try to decrease O2 supplementation from 3 to 2 LPM. O2 sat goal >89%, continue monitoring.  Spent 45 minutes with pt. During this time Hx was obtained and documented,examination performed. Recent blood work and imaging was reviewed,and Dx's/paln discussed. She needs need assistance for some ADL's and lives alone, so I think it is appropriate to move to assisted living. Explained process. A list of some assisted living facilities in the area was given.  Return in about 5 months (around 07/22/2020).   Valeen Borys G. [name], MD  [facility]. [place] office.  A few things to remember from today's visit:   We will  adjust iron and magnesium if needed. No changes in medications today. PT will be arranged. Continue Cymbalta for arthritis.  If you need refills please call your pharmacy. Do not use My Chart to request refills or for acute issues that need immediate attention.    Please be sure medication list is accurate. If a new problem present, please set up appointment sooner than planned today.

## 2020-02-22 ENCOUNTER — Encounter: Payer: Self-pay | Admitting: Family Medicine

## 2020-02-22 LAB — CBC
HCT: 34.6 % — ABNORMAL LOW (ref 35.0–45.0)
Hemoglobin: 10.7 g/dL — ABNORMAL LOW (ref 11.7–15.5)
MCH: 25.6 pg — ABNORMAL LOW (ref 27.0–33.0)
MCHC: 30.9 g/dL — ABNORMAL LOW (ref 32.0–36.0)
MCV: 82.8 fL (ref 80.0–100.0)
MPV: 12.3 fL (ref 7.5–12.5)
Platelets: 207 10*3/uL (ref 140–400)
RBC: 4.18 10*6/uL (ref 3.80–5.10)
RDW: 14.9 % (ref 11.0–15.0)
WBC: 11.5 10*3/uL — ABNORMAL HIGH (ref 3.8–10.8)

## 2020-02-22 LAB — HEMOGLOBIN A1C
Hgb A1c MFr Bld: 7.7 % of total Hgb — ABNORMAL HIGH (ref <5.7)
Mean Plasma Glucose: 174 (calc)
eAG (mmol/L): 9.7 (calc)

## 2020-02-22 LAB — MAGNESIUM: Magnesium: 2.1 mg/dL (ref 1.5–2.5)

## 2020-02-22 LAB — FRUCTOSAMINE: Fructosamine: 393 umol/L — ABNORMAL HIGH (ref 205–285)

## 2020-02-22 LAB — IRON: Iron: 34 ug/dL — ABNORMAL LOW (ref 45–160)

## 2020-02-23 MED ORDER — HUMULIN N KWIKPEN 100 UNIT/ML ~~LOC~~ SUPN: PEN_INJECTOR | SUBCUTANEOUS | 2 refills | Status: DC

## 2020-02-26 ENCOUNTER — Other Ambulatory Visit: Payer: Self-pay

## 2020-02-26 MED ORDER — VITAMIN C 500 MG PO CAPS: ORAL_CAPSULE | ORAL | 0 refills | Status: DC

## 2020-03-05 ENCOUNTER — Other Ambulatory Visit: Payer: Self-pay | Admitting: Neurology

## 2020-03-05 ENCOUNTER — Other Ambulatory Visit: Payer: Self-pay | Admitting: Family Medicine

## 2020-03-05 ENCOUNTER — Telehealth: Payer: Self-pay | Admitting: Family Medicine

## 2020-03-05 DIAGNOSIS — J309 Allergic rhinitis, unspecified: Secondary | ICD-10-CM

## 2020-03-05 NOTE — Telephone Encounter (Signed)
Brittany Roberts dropped off the FL2 form. When completed she would like a call at [phone] to pick it up.   Placed in red folder.

## 2020-03-05 NOTE — Telephone Encounter (Signed)
Noted, will keep an eye out for the form.

## 2020-03-05 NOTE — Telephone Encounter (Signed)
Pt's daughter, [name], stated she will be bringing by a FL2 form to be completed b/c her mother will be moving into a facility. Informed her of the possible fee and 3-5 business day. She wanted me to send a note back as an [place]

## 2020-03-07 NOTE — Telephone Encounter (Signed)
Form is on provider's desk to be signed.

## 2020-03-08 DIAGNOSIS — I5032 Chronic diastolic (congestive) heart failure: Secondary | ICD-10-CM | POA: Diagnosis not present

## 2020-03-08 DIAGNOSIS — I10 Essential (primary) hypertension: Secondary | ICD-10-CM | POA: Diagnosis not present

## 2020-03-08 DIAGNOSIS — J45909 Unspecified asthma, uncomplicated: Secondary | ICD-10-CM | POA: Diagnosis not present

## 2020-03-11 NOTE — Telephone Encounter (Signed)
Spoke with pt's daughter. She is aware form is completed & ready for pick up.

## 2020-03-12 NOTE — Telephone Encounter (Signed)
Form corrected

## 2020-03-12 NOTE — Telephone Encounter (Signed)
Daughter stated that there are missing diagnoses on the pt's form. She stated she has lupus, congestive heart failure and fiber myalgia.

## 2020-03-13 ENCOUNTER — Telehealth: Payer: Self-pay | Admitting: Family Medicine

## 2020-03-13 ENCOUNTER — Ambulatory Visit: Payer: Medicare Other | Admitting: Cardiology

## 2020-03-13 NOTE — Telephone Encounter (Signed)
Tammy is calling to say pt is having a gout flare up. Is taking   allopurinol (ZYLOPRIM) 100 MG tablet   And is wondering if there is anything else she can take.  Please call Tammy at [phone] ext. [phone]

## 2020-03-13 NOTE — Progress Notes (Deleted)
Subjective:   Brittany Roberts, female    DOB: [birth year], 79 y.o.   MRN: [id number]   Chief complaint:  Shortness of breath, hospital f/u  79 year-old African-American female with nonischemic cardiomyopathy, CAD (PCI RCA), obesity, hypertension, type II diabetes mellitus, morbid obesity, peripheral neuropathy, lupus.  Hospital note: Patient was last seen by me in 10/2019, and was doing well at that tim. She was admitted to [facility] on 12/05/2019 with chest pressure, shortness of breath, leg swelling. Workup showed pulmonary congestion, elevated BNP. Today, sh was also found to have LV apical thrombus.  She is undergoing appropriate IV diuresis with improvement in her symptoms at this time.  Patient was recently hospitalized in May 2021 with Covid pneumonia.  At that time, she had expressed wishes to be DNR.  She still wants to continue the status.  Acute on chronic systolic heart failure, LV apical thrombus I reviewed patient's historical echocardiograms from the past.  Although, she has always had inferior hypokinesis due to old myocardial injury, apical inferior akinesis was never this pronounced before.  This either may be due to a new infarct, or more likely stress-induced cardiomyopathy.  Recent Covid infection could have been in her stressor.  Recommend anticoagulation.  But warfarin is the only approved medication for using LV apical thrombus, given her age and renal function, I recommend off label use of Eliquis for better compliance and maintenance of therapeutic anticoagulation.  Discussed with pharmacy.  Recommend DVT/PE dose loading with 10 mg twice daily, followed by 5 mg twice daily.  She is improved with her volume status.  Management of systolic heart failure limited due to renal function.  Continue diuresis, may switch to torsemide 20 mg twice daily on discharge.  Blood pressure low normal, thus unable to use BiDil.  Continue beta-blocker.  Since hospital discharge,  patient has been on supplemental oxygen.  Her shortness of breath Is improved.  Blood pressure is usually lower than what it is today.  She has not had any bleeding issues.  Creatinine noted to be elevated at 1.9.  Current Outpatient Medications on File Prior to Visit  Medication Sig Dispense Refill  . Accu-Chek Softclix Lancets lancets Use as instructed 100 each 3  . acetaminophen (TYLENOL) 500 MG tablet Take 500 mg by mouth every 6 (six) hours as needed for moderate pain.    Marland Kitchen allopurinol (ZYLOPRIM) 100 MG tablet Take 2 tablets (200 mg total) by mouth daily. 180 tablet 1  . amLODipine (NORVASC) 5 MG tablet Take 1 tablet (5 mg total) by mouth daily. (Patient taking differently: Take 2.5 mg by mouth daily. ) 90 tablet 3  . Ascorbic Acid (VITAMIN C) 500 MG CAPS Take 1 capsule by mouth daily. 30 capsule 0  . atorvastatin (LIPITOR) 20 MG tablet TAKE 1 TABLET(20 MG) BY MOUTH DAILY 90 tablet 0  . B Complex Vitamins (VITAMIN B COMPLEX) TABS Take 1 tablet by mouth daily.    . budesonide-formoterol (SYMBICORT) 80-4.5 MCG/ACT inhaler Inhale 2 puffs into the lungs 2 (two) times daily. 1 Inhaler 3  . Cholecalciferol (VITAMIN D3) 5000 UNITS CAPS Take 5,000 Units by mouth daily.    . DULoxetine (CYMBALTA) 60 MG capsule Take 1 capsule (60 mg total) by mouth daily. 90 capsule 2  . ferrous sulfate 325 (65 FE) MG tablet Take 325 mg by mouth daily with breakfast.    . Fluocinolone Acetonide Scalp 0.01 % OIL Apply 1 application topically at bedtime as needed (itching scalp).     Marland Kitchen  gabapentin (NEURONTIN) 100 MG capsule Take 1 capsule (100 mg total) by mouth 2 (two) times daily. 60 capsule 3  . glucose blood (ACCU-CHEK AVIVA PLUS) test strip Use to test blood sugar three times daily 100 each 3  . guaiFENesin-dextromethorphan (ROBITUSSIN DM) 100-10 MG/5ML syrup Take 5 mLs by mouth every 4 (four) hours as needed for cough. 118 mL 0  . Insulin NPH, Human,, Isophane, (HUMULIN N KWIKPEN) 100 UNIT/ML Kiwkpen ADMINISTER 15  U am and 10 U pm 30 mL 2  . Insulin Pen Needle (B-D ULTRAFINE III SHORT PEN) 31G X 8 MM MISC CHECK BLOOD SUGAR 3 TIMES A DAY 100 each 3  . isosorbide mononitrate (IMDUR) 30 MG 24 hr tablet TAKE 1 TABLET(30 MG) BY MOUTH DAILY 90 tablet 3  . levETIRAcetam (KEPPRA) 500 MG tablet Take 1 tablet (500 mg total) by mouth 2 (two) times daily. 180 tablet 3  . linaclotide (LINZESS) 145 MCG CAPS capsule Take 1 capsule (145 mcg total) by mouth daily as needed (abdominal cramping).    . memantine (NAMENDA) 10 MG tablet Take 1 tablet (10 mg total) by mouth 2 (two) times daily. Please call [phone] to schedule follow up for continued refills. 60 tablet 2  . metoprolol succinate (TOPROL-XL) 50 MG 24 hr tablet TAKE 1 TABLET BY MOUTH EVERY DAY. TAKE WITH OR IMMEDIATELY FOLLOWING A MEAL 90 tablet 1  . montelukast (SINGULAIR) 10 MG tablet TAKE 1 TABLET(10 MG) BY MOUTH AT BEDTIME 90 tablet 3  . Omega-3 Fatty Acids (FISH OIL) 1000 MG CAPS Take 1,000 mg by mouth daily.     . pantoprazole (PROTONIX) 40 MG tablet TAKE 1 TABLET(40 MG) BY MOUTH DAILY (Patient taking differently: Take 40 mg by mouth daily. ) 90 tablet 3  . Polyethyl Glycol-Propyl Glycol (SYSTANE) 0.4-0.3 % GEL ophthalmic gel Place 1 application into both eyes every 4 (four) hours as needed (dry eyes).    Marland Kitchen PROAIR HFA 108 (90 Base) MCG/ACT inhaler INHALE 2 PUFF BY MOUTH EVERY 4 HOURS AS NEEDED USE ONLY IF YOU ARE WHEEZING (Patient taking differently: Inhale 2 puffs into the lungs every 4 (four) hours as needed for wheezing. ) 8.5 g 3  . Spacer/Aero-Holding Chambers (AEROCHAMBER PLUS) inhaler Use as instructed to use with inahaler. 1 each 1  . torsemide (DEMADEX) 20 MG tablet Take 1 tablet (20 mg total) by mouth 2 (two) times daily. 60 tablet 2  . warfarin (COUMADIN) 2.5 MG tablet Take 1 tablet by mouth daily as directed. Refer to most recent anticoagulation note for most accurate dosing instructions. 90 tablet 3  . warfarin (COUMADIN) 5 MG tablet Take 1.5 tablets  (7.5 mg total) by mouth daily. 45 tablet 3   No current facility-administered medications on file prior to visit.    Cardiovascular studies:  EKG 12/13/2019: Sinus rhythm 73 bpm. Left anterior fascicular block Low boltage Poor R wave progression  Echocardiogram 12/14/2017: - Left ventricle: The cavity size was normal. Systolic function was  mildly reduced. The estimated ejection fraction was in the range  of 45% to 50%. Akinesis of the inferior myocardium. Features are  consistent with a pseudonormal left ventricular filling pattern,  with concomitant abnormal relaxation and increased filling  pressure (grade 2 diastolic dysfunction). - Mitral valve: Mildly thickened leaflets . Chordal calcification.  There was mild regurgitation. Valve area by pressure half-time: 1.47 cm. - Left atrium: The atrium was mildly dilated. - Right atrium: The atrium was mildly dilated. - Pericardium, extracardiac: Trace pericardial effusion was  identified. - Significant improvement in LVEF compared to previous outpatient   echocardiogram in 2018.  Coronary angiography 07/01/2015: 1. Findings consistent with nonischemic cardiomyopathy, LVEF 25-30% with global hypokinesis. 2. Previously placed 2.5 mm DES in the PDA branch of the right coronary artery in 2014 is widely patent. Mid RCA has a 50% stenosis, FFR 0.84, not hemodynamically significant. 3. Diffusely diseased left coronary arteries, especially involving the distal LAD and circumflex coronary artery, may be the etiology for her angina pectoris.   Recent labs: 12/11/2019: Glucose 255, BUN/Cr 44/1.9. EGFR 30. Na/K 137/3.8. Rest of the CMP normal H/H 10.4/32.2. MCV 77.6. Platelets 236  03/14/2019: Glucose 84, BUN/Cr 40/1.87. EGFR 31. Na/K 143/5.2. Rest of the CMP normal HbA1C 6.5% TSH 1.2 normal  07/2018: H/H 12/37. MCV 74. Platelets 239  2016: Chol 103, TG 81, HDL 38, LDL 49  Review of Systems  Constitutional: Negative for decreased appetite,  malaise/fatigue, weight gain and weight loss.  HENT: Negative for congestion.   Eyes: Negative for visual disturbance.  Cardiovascular: Positive for chest pain. Negative for dyspnea on exertion, leg swelling, palpitations and syncope.  Respiratory: Negative for cough.   Endocrine: Negative for cold intolerance.  Hematologic/Lymphatic: Does not bruise/bleed easily.  Skin: Negative for itching and rash.       Rash on face  Musculoskeletal: Positive for joint pain. Negative for myalgias.  Gastrointestinal: Negative for abdominal pain, nausea and vomiting.  Genitourinary: Negative for dysuria.  Neurological: Negative for dizziness and weakness.  Psychiatric/Behavioral: The patient is not nervous/anxious.   All other systems reviewed and are negative.        There were no vitals filed for this visit.     Objective:    Physical Exam Vitals and nursing note reviewed.  Constitutional:      Appearance: She is well-developed.  Neck:     Vascular: No JVD.  Cardiovascular:     Rate and Rhythm: Normal rate and regular rhythm.     Pulses: Intact distal pulses.     Heart sounds: Normal heart sounds. No murmur heard.   Pulmonary:     Effort: Pulmonary effort is normal.     Breath sounds: Normal breath sounds. No wheezing or rales.           Assessment & Recommendations:   79 year-old African-American female with nonischemic cardiomyopathy, CAD (PCI RCA), obesity, hypertension, type II diabetes mellitus, morbid obesity, peripheral neuropathy, lupus, now with acute on chronic systolic heart failure, LV apical thrombus.  Chronic systolic heart failure, LV apical thrombus: Clinically euvolemic.  In light of increasing creatinine from 1.6-1.9, I have asked her to reduce torsemide to 20 mg daily. She has been on loading dose of Eliquis, followed by 5 mg twice daily use for management of LV thrombus.  However, given her increasing creatinine, I am concerned about her bleeding risk.   2.5 mg twice daily dose would not be adequate for treatment of presence of LV thrombus.  Therefore, I have decided to switch her to warfarin daily.  She will continue follow-up with her INR clinic.  Recommend warfarin for 3 months.  After that, warfarin could be stopped and aspirin 81 mg could be resumed. Given her advanced and fluctuating CKD, I do not think she will benefit from cardiac catheterization at this time.  Continue medical management.    Nigel Mormon, MD [facility] Pager: [phone] Office: [phone] If no answer Cell [phone]

## 2020-03-14 ENCOUNTER — Telehealth: Payer: Self-pay | Admitting: Family Medicine

## 2020-03-14 ENCOUNTER — Other Ambulatory Visit: Payer: Self-pay | Admitting: Family Medicine

## 2020-03-14 DIAGNOSIS — M109 Gout, unspecified: Secondary | ICD-10-CM

## 2020-03-14 DIAGNOSIS — J45909 Unspecified asthma, uncomplicated: Secondary | ICD-10-CM | POA: Diagnosis not present

## 2020-03-14 DIAGNOSIS — I10 Essential (primary) hypertension: Secondary | ICD-10-CM | POA: Diagnosis not present

## 2020-03-14 DIAGNOSIS — M25869 Other specified joint disorders, unspecified knee: Secondary | ICD-10-CM | POA: Diagnosis not present

## 2020-03-14 MED ORDER — PREDNISONE 20 MG PO TABS: ORAL_TABLET | ORAL | 0 refills | Status: AC

## 2020-03-14 MED ORDER — COLCHICINE 0.6 MG PO TABS: ORAL_TABLET | ORAL | 0 refills | Status: DC

## 2020-03-14 NOTE — Telephone Encounter (Signed)
Pt is calling saying she is in pain with her gout and really needs something for it. She wants something called in for her please.  [facility] [phone] - [place], [place] - [address]  [place], [place] [address]  Phone:  [phone] Fax:  [phone]   Please send something as soon as possible and call her to let her know it is sent  [phone]

## 2020-03-14 NOTE — Telephone Encounter (Signed)
I spoke with pt's daughter. She is aware both Rx's have been sent into the pharmacy.

## 2020-03-14 NOTE — Telephone Encounter (Signed)
error 

## 2020-03-14 NOTE — Telephone Encounter (Signed)
I sent to the pharmacy Prednisone and colchicine. Prednisone taper as instructed on Rx. Colchicine 0.6 mg daily for 7 days then prn for future attacks. Follow up if not better.  Thanks, BJ

## 2020-03-14 NOTE — Telephone Encounter (Signed)
  [place] daughter  Calling to see if there is anything that her mother can take to help with her gout.  Please advise

## 2020-03-26 DIAGNOSIS — D631 Anemia in chronic kidney disease: Secondary | ICD-10-CM | POA: Diagnosis not present

## 2020-03-26 DIAGNOSIS — I129 Hypertensive chronic kidney disease with stage 1 through stage 4 chronic kidney disease, or unspecified chronic kidney disease: Secondary | ICD-10-CM | POA: Diagnosis not present

## 2020-03-26 DIAGNOSIS — E1122 Type 2 diabetes mellitus with diabetic chronic kidney disease: Secondary | ICD-10-CM | POA: Diagnosis not present

## 2020-03-26 DIAGNOSIS — N189 Chronic kidney disease, unspecified: Secondary | ICD-10-CM | POA: Diagnosis not present

## 2020-03-26 DIAGNOSIS — N1832 Chronic kidney disease, stage 3b: Secondary | ICD-10-CM | POA: Diagnosis not present

## 2020-03-26 DIAGNOSIS — Z23 Encounter for immunization: Secondary | ICD-10-CM | POA: Diagnosis not present

## 2020-03-28 ENCOUNTER — Telehealth: Payer: Self-pay | Admitting: Family Medicine

## 2020-03-28 ENCOUNTER — Encounter: Payer: Self-pay | Admitting: Cardiology

## 2020-03-28 ENCOUNTER — Other Ambulatory Visit: Payer: Self-pay

## 2020-03-28 ENCOUNTER — Ambulatory Visit: Payer: Medicare Other | Admitting: Cardiology

## 2020-03-28 VITALS — BP 105/47 | HR 73 | Resp 16 | Ht 63.0 in | Wt 280.0 lb

## 2020-03-28 DIAGNOSIS — Z7901 Long term (current) use of anticoagulants: Secondary | ICD-10-CM | POA: Diagnosis not present

## 2020-03-28 DIAGNOSIS — I513 Intracardiac thrombosis, not elsewhere classified: Secondary | ICD-10-CM

## 2020-03-28 DIAGNOSIS — I5022 Chronic systolic (congestive) heart failure: Secondary | ICD-10-CM

## 2020-03-28 DIAGNOSIS — M329 Systemic lupus erythematosus, unspecified: Secondary | ICD-10-CM

## 2020-03-28 DIAGNOSIS — Z5181 Encounter for therapeutic drug level monitoring: Secondary | ICD-10-CM

## 2020-03-28 LAB — POCT INR: INR: 2.6 (ref 2.0–3.0)

## 2020-03-28 NOTE — Telephone Encounter (Signed)
  pt daughter  would like to know if  the pt should be on memantine, please call  [name] [phone]

## 2020-03-28 NOTE — Addendum Note (Signed)
Addended by: Manuela Schwartz T on: 03/28/2020 09:22 AM   Modules accepted: Orders

## 2020-03-28 NOTE — Progress Notes (Signed)
Subjective:   Brittany Roberts, female    DOB: Feb 22, 1941, 79 y.o.   MRN: [id number]   Chief complaint:  Chronic systolic heart failure  79 year-old African-American female with nonischemic cardiomyopathy, CAD (PCI RCA), obesity, hypertension, type II diabetes mellitus, morbid obesity, peripheral neuropathy, lupus, now with acute on chronic systolic heart failure, LV apical thrombus.  Patient is here with her daughter.  She is doing fairly well without any worsening exertional dyspnea, leg edema symptoms.  She is on stable supplemental oxygen at home.  She is considering moving into a nursing facility.  Today, she confirmed her wishes not to be resuscitated, in case of cardiac arrest.   Current Outpatient Medications on File Prior to Visit  Medication Sig Dispense Refill  . Accu-Chek Softclix Lancets lancets Use as instructed 100 each 3  . acetaminophen (TYLENOL) 500 MG tablet Take 500 mg by mouth every 6 (six) hours as needed for moderate pain.    Marland Kitchen allopurinol (ZYLOPRIM) 100 MG tablet Take 2 tablets (200 mg total) by mouth daily. 180 tablet 1  . amLODipine (NORVASC) 5 MG tablet Take 1 tablet (5 mg total) by mouth daily. (Patient taking differently: Take 2.5 mg by mouth daily. ) 90 tablet 3  . Ascorbic Acid (VITAMIN C) 500 MG CAPS Take 1 capsule by mouth daily. 30 capsule 0  . atorvastatin (LIPITOR) 20 MG tablet TAKE 1 TABLET(20 MG) BY MOUTH DAILY 90 tablet 0  . B Complex Vitamins (VITAMIN B COMPLEX) TABS Take 1 tablet by mouth daily.    . budesonide-formoterol (SYMBICORT) 80-4.5 MCG/ACT inhaler Inhale 2 puffs into the lungs 2 (two) times daily. 1 Inhaler 3  . Cholecalciferol (VITAMIN D3) 5000 UNITS CAPS Take 5,000 Units by mouth daily.    . colchicine 0.6 MG tablet 1 tab daily for 7 days then as needed for acute attacks. 30 tablet 0  . DULoxetine (CYMBALTA) 60 MG capsule Take 1 capsule (60 mg total) by mouth daily. 90 capsule 2  . ferrous sulfate 325 (65 FE) MG tablet Take 325 mg by mouth  2 (two) times daily with a meal.     . Fluocinolone Acetonide Scalp 0.01 % OIL Apply 1 application topically at bedtime as needed (itching scalp).     . gabapentin (NEURONTIN) 100 MG capsule Take 1 capsule (100 mg total) by mouth 2 (two) times daily. 60 capsule 3  . glucose blood (ACCU-CHEK AVIVA PLUS) test strip Use to test blood sugar three times daily 100 each 3  . guaiFENesin-dextromethorphan (ROBITUSSIN DM) 100-10 MG/5ML syrup Take 5 mLs by mouth every 4 (four) hours as needed for cough. 118 mL 0  . Insulin NPH, Human,, Isophane, (HUMULIN N KWIKPEN) 100 UNIT/ML Kiwkpen ADMINISTER 15 U am and 10 U pm 30 mL 2  . Insulin Pen Needle (B-D ULTRAFINE III SHORT PEN) 31G X 8 MM MISC CHECK BLOOD SUGAR 3 TIMES A DAY 100 each 3  . isosorbide mononitrate (IMDUR) 30 MG 24 hr tablet TAKE 1 TABLET(30 MG) BY MOUTH DAILY 90 tablet 3  . levETIRAcetam (KEPPRA) 500 MG tablet Take 1 tablet (500 mg total) by mouth 2 (two) times daily. 180 tablet 3  . linaclotide (LINZESS) 145 MCG CAPS capsule Take 1 capsule (145 mcg total) by mouth daily as needed (abdominal cramping).    . metoprolol succinate (TOPROL-XL) 50 MG 24 hr tablet TAKE 1 TABLET BY MOUTH EVERY DAY. TAKE WITH OR IMMEDIATELY FOLLOWING A MEAL 90 tablet 1  . montelukast (SINGULAIR) 10 MG  tablet TAKE 1 TABLET(10 MG) BY MOUTH AT BEDTIME 90 tablet 3  . Omega-3 Fatty Acids (FISH OIL) 1000 MG CAPS Take 1,000 mg by mouth daily.     . pantoprazole (PROTONIX) 40 MG tablet TAKE 1 TABLET(40 MG) BY MOUTH DAILY (Patient taking differently: Take 40 mg by mouth daily. ) 90 tablet 3  . Polyethyl Glycol-Propyl Glycol (SYSTANE) 0.4-0.3 % GEL ophthalmic gel Place 1 application into both eyes every 4 (four) hours as needed (dry eyes).    Marland Kitchen PROAIR HFA 108 (90 Base) MCG/ACT inhaler INHALE 2 PUFF BY MOUTH EVERY 4 HOURS AS NEEDED USE ONLY IF YOU ARE WHEEZING (Patient taking differently: Inhale 2 puffs into the lungs every 4 (four) hours as needed for wheezing. ) 8.5 g 3  .  Spacer/Aero-Holding Chambers (AEROCHAMBER PLUS) inhaler Use as instructed to use with inahaler. 1 each 1  . torsemide (DEMADEX) 20 MG tablet Take 1 tablet (20 mg total) by mouth 2 (two) times daily. 60 tablet 2  . warfarin (COUMADIN) 2.5 MG tablet Take 1 tablet by mouth daily as directed. Refer to most recent anticoagulation note for most accurate dosing instructions. (Patient taking differently: Pt currently taking 1.25 mg daily. Refer to most recent anticoagulation note for most accurate dosing instructions.) 90 tablet 3   No current facility-administered medications on file prior to visit.    Cardiovascular studies:  EKG 12/13/2019: Sinus rhythm 73 bpm. Left anterior fascicular block Low boltage Poor R wave progression  Echocardiogram 12/14/2017: - Left ventricle: The cavity size was normal. Systolic function was  mildly reduced. The estimated ejection fraction was in the range  of 45% to 50%. Akinesis of the inferior myocardium. Features are  consistent with a pseudonormal left ventricular filling pattern,  with concomitant abnormal relaxation and increased filling  pressure (grade 2 diastolic dysfunction). - Mitral valve: Mildly thickened leaflets . Chordal calcification.  There was mild regurgitation. Valve area by pressure half-time: 1.47 cm. - Left atrium: The atrium was mildly dilated. - Right atrium: The atrium was mildly dilated. - Pericardium, extracardiac: Trace pericardial effusion was  identified. - Significant improvement in LVEF compared to previous outpatient   echocardiogram in 2018.  Coronary angiography 07/01/2015: 1. Findings consistent with nonischemic cardiomyopathy, LVEF 25-30% with global hypokinesis. 2. Previously placed 2.5 mm DES in the PDA branch of the right coronary artery in 2014 is widely patent. Mid RCA has a 50% stenosis, FFR 0.84, not hemodynamically significant. 3. Diffusely diseased left coronary arteries, especially involving the distal LAD and  circumflex coronary artery, may be the etiology for her angina pectoris.   Recent labs: Jul-Sep 2021: Glucose 132, BUN/Cr 50/1.8. EGFR 25.  H/H 04/10/33.6. MCV 82.8. Platelets 207 HbA1C 7.7%  12/11/2019: Glucose 255, BUN/Cr 44/1.9. EGFR 30. Na/K 137/3.8. Rest of the CMP normal H/H 10.4/32.2. MCV 77.6. Platelets 236  03/14/2019: Glucose 84, BUN/Cr 40/1.87. EGFR 31. Na/K 143/5.2. Rest of the CMP normal HbA1C 6.5% TSH 1.2 normal  07/2018: H/H 12/37. MCV 74. Platelets 239  2016: Chol 103, TG 81, HDL 38, LDL 49  Review of Systems  Cardiovascular: Negative for chest pain, dyspnea on exertion, leg swelling, palpitations and syncope.        Vitals:   03/28/20 1356  BP: (!) 105/47  Pulse: 73  Resp: 16  SpO2: 97%    Physical Exam Vitals and nursing note reviewed.  Constitutional:      General: She is not in acute distress.    Appearance: She is obese.  Comments: On supplemental oxygen  Neck:     Vascular: No JVD.  Cardiovascular:     Rate and Rhythm: Normal rate and regular rhythm.     Heart sounds: Normal heart sounds. No murmur heard.   Pulmonary:     Effort: Pulmonary effort is normal.     Breath sounds: Normal breath sounds. No wheezing or rales.        Objective:    Physical Exam Vitals and nursing note reviewed.  Constitutional:      Appearance: She is well-developed.  Neck:     Vascular: No JVD.  Cardiovascular:     Rate and Rhythm: Normal rate and regular rhythm.     Pulses: Intact distal pulses.     Heart sounds: Normal heart sounds. No murmur heard.   Pulmonary:     Effort: Pulmonary effort is normal.     Breath sounds: Normal breath sounds. No wheezing or rales.           Assessment & Recommendations:   79 year-old African-American female with nonischemic cardiomyopathy, CAD (PCI RCA), obesity, hypertension, type II diabetes mellitus, morbid obesity, peripheral neuropathy, lupus, now with acute on chronic systolic heart failure, LV  apical thrombus.  Chronic systolic heart failure, LV apical thrombus: Clinically euvolemic. Completed 3 months anticoagulation, okay to stop now. Treatment of systolic heart failure limited due to advanced kidney disease. Continue metoprolol succinate 50 mg daily. Unable to use Bidil due to h/o allergic reaction, although she is unable to further clarify.   F/u in 6 months  Mariadelosang Wynns Emiliano Dyer, MD [facility] Pager: [phone] Office: [phone] If no answer Cell [phone]

## 2020-03-31 ENCOUNTER — Other Ambulatory Visit: Payer: Self-pay | Admitting: Family Medicine

## 2020-03-31 DIAGNOSIS — E1140 Type 2 diabetes mellitus with diabetic neuropathy, unspecified: Secondary | ICD-10-CM

## 2020-03-31 DIAGNOSIS — J441 Chronic obstructive pulmonary disease with (acute) exacerbation: Secondary | ICD-10-CM

## 2020-04-01 NOTE — Telephone Encounter (Signed)
Memantine is not on her med list. It is usually part of treatment for dementia. We can discuss this next visit or she can discuss it with her neurologist,if family is concerned about worsening memory issues. Thanks, BJ

## 2020-04-01 NOTE — Telephone Encounter (Signed)
I spoke with patient's daughter. Pt is on the memantine, her neurologist prescribed it for her. They will let us know the dosage and I will add it to her medication list.

## 2020-04-07 ENCOUNTER — Encounter: Payer: Medicare Other | Attending: Family Medicine | Admitting: Dietician

## 2020-04-13 DIAGNOSIS — I10 Essential (primary) hypertension: Secondary | ICD-10-CM | POA: Diagnosis not present

## 2020-04-13 DIAGNOSIS — M25869 Other specified joint disorders, unspecified knee: Secondary | ICD-10-CM | POA: Diagnosis not present

## 2020-04-13 DIAGNOSIS — J45909 Unspecified asthma, uncomplicated: Secondary | ICD-10-CM | POA: Diagnosis not present

## 2020-04-15 ENCOUNTER — Telehealth: Payer: Medicare Other | Admitting: Dietician

## 2020-04-15 ENCOUNTER — Telehealth: Payer: Self-pay

## 2020-04-15 NOTE — Telephone Encounter (Signed)
I called and spoke with pt's daughter to see how pt is doing. She said that pt is doing okay. She hasn't been wearing her oxygen, so she is having a hard time breathing. They will keep the appt for Friday at 12 and let us know if anything changes.

## 2020-04-18 ENCOUNTER — Ambulatory Visit (INDEPENDENT_AMBULATORY_CARE_PROVIDER_SITE_OTHER): Payer: Medicare Other | Admitting: Family Medicine

## 2020-04-18 ENCOUNTER — Encounter: Payer: Self-pay | Admitting: Family Medicine

## 2020-04-18 ENCOUNTER — Other Ambulatory Visit: Payer: Self-pay

## 2020-04-18 VITALS — BP 132/80 | HR 91 | Resp 16 | Ht 63.0 in | Wt 281.2 lb

## 2020-04-18 DIAGNOSIS — K921 Melena: Secondary | ICD-10-CM | POA: Diagnosis not present

## 2020-04-18 DIAGNOSIS — Z6841 Body Mass Index (BMI) 40.0 and over, adult: Secondary | ICD-10-CM

## 2020-04-18 DIAGNOSIS — K219 Gastro-esophageal reflux disease without esophagitis: Secondary | ICD-10-CM | POA: Diagnosis not present

## 2020-04-18 DIAGNOSIS — D239 Other benign neoplasm of skin, unspecified: Secondary | ICD-10-CM | POA: Diagnosis not present

## 2020-04-18 DIAGNOSIS — M26622 Arthralgia of left temporomandibular joint: Secondary | ICD-10-CM | POA: Diagnosis not present

## 2020-04-18 NOTE — Patient Instructions (Addendum)
A few things to remember from today's visit:  On examination today I do not see any thing that suggest a blood clot. No masses found on exam. GI referral placed. Continue watching your diet and keep appt with nutritionist.    Temporomandibular Joint Syndrome  Temporomandibular joint syndrome (TMJ syndrome) is a condition that causes pain in the temporomandibular joints. These joints are located near your ears and allow your jaw to open and close. For people with TMJ syndrome, chewing, biting, or other movements of the jaw can be difficult or painful. TMJ syndrome is often mild and goes away within a few weeks. However, sometimes the condition becomes a long-term (chronic) problem. What are the causes? This condition may be caused by:  Grinding your teeth or clenching your jaw. Some people do this when they are under stress.  Arthritis.  Injury to the jaw.  Head or neck injury.  Teeth or dentures that are not aligned well. In some cases, the cause of TMJ syndrome may not be known. What are the signs or symptoms? The most common symptom of this condition is an aching pain on the side of the head in the area of the TMJ. Other symptoms may include:  Pain when moving your jaw, such as when chewing or biting.  Being unable to open your jaw all the way.  Making a clicking sound when you open your mouth.  Headache.  Earache.  Neck or shoulder pain. How is this diagnosed? This condition may be diagnosed based on:  Your symptoms and medical history.  A physical exam. Your health care provider may check the range of motion of your jaw.  Imaging tests, such as X-rays or an MRI. You may also need to see your dentist, who will determine if your teeth and jaw are lined up correctly. How is this treated? TMJ syndrome often goes away on its own. If treatment is needed, the options may include:  Eating soft foods and applying ice or heat.  Medicines to relieve pain or  inflammation.  Medicines or massage to relax the muscles.  A splint, bite plate, or mouthpiece to prevent teeth grinding or jaw clenching.  Relaxation techniques or counseling to help reduce stress.  A therapy for pain in which an electrical current is applied to the nerves through the skin (transcutaneous electrical nerve stimulation).  Acupuncture. This is sometimes helpful to relieve pain.  Jaw surgery. This is rarely needed. Follow these instructions at home:  Eating and drinking  Eat a soft diet if you are having trouble chewing.  Avoid foods that require a lot of chewing. Do not chew gum. General instructions  Take over-the-counter and prescription medicines only as told by your health care provider.  If directed, put ice on the painful area. ? Put ice in a plastic bag. ? Place a towel between your skin and the bag. ? Leave the ice on for 20 minutes, 2-3 times a day.  Apply a warm, wet cloth (warm compress) to the painful area as directed.  Massage your jaw area and do any jaw stretching exercises as told by your health care provider.  If you were given a splint, bite plate, or mouthpiece, wear it as told by your health care provider.  Keep all follow-up visits as told by your health care provider. This is important. Contact a health care provider if:  You are having trouble eating.  You have new or worsening symptoms. Get help right away if:  Your jaw  locks open or closed. Summary  Temporomandibular joint syndrome (TMJ syndrome) is a condition that causes pain in the temporomandibular joints. These joints are located near your ears and allow your jaw to open and close.  TMJ syndrome is often mild and goes away within a few weeks. However, sometimes the condition becomes a long-term (chronic) problem.  Symptoms include an aching pain on the side of the head in the area of the TMJ, pain when chewing or biting, and being unable to open your jaw all the way. You  may also make a clicking sound when you open your mouth.  TMJ syndrome often goes away on its own. If treatment is needed, it may include medicines to relieve pain, reduce inflammation, or relax the muscles. A splint, bite plate, or mouthpiece may also be used to prevent teeth grinding or jaw clenching. This information is not intended to replace advice given to you by your health care provider. Make sure you discuss any questions you have with your health care provider. Document Revised: 09/02/2017 Document Reviewed: 08/02/2017 Elsevier Patient Education  [employer].  If you need refills please call your pharmacy. Do not use My Chart to request refills or for acute issues that need immediate attention.    Please be sure medication list is accurate. If a new problem present, please set up appointment sooner than planned today.

## 2020-04-18 NOTE — Progress Notes (Signed)
Chief Complaint  Patient presents with  . Follow-up   HPI: [name] is a 79 y.o. female, who is here today with her granddaughter complaining of "knot" in right LE, she thinks this may be a "blood clot." She noted lesion "a while back" when she rubbed area above right knee. She has not noted changes. Negative for skin changes or pain. Denies CP,SOB,palpitations,or diaphoresis. Negative for fever,changes in appetite, or LE edema/erythema.  Left TMJ pain. It is aggravated by movement with chewing. Negative for trauma. She has not taken OTC medications. Negative for limitation of ROM,local edema or erythema. According to granddaughter, she has been eating popcorn lately, the one with hard caramel.  She is also c/o "black stools" intermittently for a few months now, called in 12/2019 reporting problem. She has not noted changes in bowel habits, hx of constipation, red blood per rectum, unusual abdominal pain,nausea,or vomiting.  Last time she saw black stools was 3 days ago, yesterday just "a little."  Her granddaughter thinks it may be related to iron supplementation. She has been on Fe Sulfate for a while now and back stools just started. Anemia of chronic disease, dose increased around 3 months ago.  Lab Results  Component Value Date   WBC 11.5 (H) 02/20/2020   HGB 10.7 (L) 02/20/2020   HCT 34.6 (L) 02/20/2020   MCV 82.8 02/20/2020   PLT 207 02/20/2020   GERD: She is not having heartburn. She is on Protonix 40 mg daily.  She is trying to eat healthier. She has an appt with nutritionist in a few weeks. She cannot exercise regularly because chronic pain/unstable gait. BS's are better: Low 110's-150's.  Review of Systems  Constitutional: Positive for fatigue. Negative for activity change.  HENT: Negative for mouth sores, nosebleeds and sore throat.   Eyes: Negative for redness.  Respiratory: Negative for cough and wheezing.   Genitourinary: Negative for  decreased urine volume, dysuria and hematuria.  Musculoskeletal: Positive for arthralgias, back pain and gait problem.  Skin: Negative for pallor and rash.  Neurological: Negative for syncope, weakness and headaches.  Psychiatric/Behavioral: Negative for confusion. The patient is nervous/anxious.   Rest see pertinent positives and negatives per HPI.  Current Outpatient Medications on File Prior to Visit  Medication Sig Dispense Refill  . Accu-Chek Softclix Lancets lancets Use as instructed 100 each 3  . acetaminophen (TYLENOL) 500 MG tablet Take 500 mg by mouth every 6 (six) hours as needed for moderate pain.    Marland Kitchen allopurinol (ZYLOPRIM) 100 MG tablet Take 2 tablets (200 mg total) by mouth daily. 180 tablet 1  . Ascorbic Acid (VITAMIN C) 500 MG CAPS Take 1 capsule by mouth daily. 30 capsule 0  . atorvastatin (LIPITOR) 20 MG tablet TAKE 1 TABLET(20 MG) BY MOUTH DAILY 90 tablet 0  . B Complex Vitamins (VITAMIN B COMPLEX) TABS Take 1 tablet by mouth daily.    . Cholecalciferol (VITAMIN D3) 5000 UNITS CAPS Take 5,000 Units by mouth daily.    . colchicine 0.6 MG tablet 1 tab daily for 7 days then as needed for acute attacks. 30 tablet 0  . DULoxetine (CYMBALTA) 60 MG capsule Take 1 capsule (60 mg total) by mouth daily. 90 capsule 2  . ferrous sulfate 325 (65 FE) MG tablet Take 325 mg by mouth 2 (two) times daily with a meal.     . Fluocinolone Acetonide Scalp 0.01 % OIL Apply 1 application topically at bedtime as needed (itching scalp).     Marland Kitchen  gabapentin (NEURONTIN) 100 MG capsule Take 1 capsule (100 mg total) by mouth 2 (two) times daily. 60 capsule 3  . glucose blood (ACCU-CHEK AVIVA PLUS) test strip Use to test blood sugar three times daily 100 each 3  . guaiFENesin-dextromethorphan (ROBITUSSIN DM) 100-10 MG/5ML syrup Take 5 mLs by mouth every 4 (four) hours as needed for cough. 118 mL 0  . Insulin NPH, Human,, Isophane, (HUMULIN N KWIKPEN) 100 UNIT/ML Kiwkpen ADMINISTER 30 UNITS UNDER THE SKIN  EVERY MORNING 30 mL 2  . Insulin Pen Needle (B-D ULTRAFINE III SHORT PEN) 31G X 8 MM MISC CHECK BLOOD SUGAR 3 TIMES A DAY 100 each 3  . isosorbide mononitrate (IMDUR) 30 MG 24 hr tablet TAKE 1 TABLET(30 MG) BY MOUTH DAILY 90 tablet 3  . levETIRAcetam (KEPPRA) 500 MG tablet Take 1 tablet (500 mg total) by mouth 2 (two) times daily. 180 tablet 3  . linaclotide (LINZESS) 145 MCG CAPS capsule Take 1 capsule (145 mcg total) by mouth daily as needed (abdominal cramping).    . memantine (NAMENDA) 10 MG tablet Take 10 mg by mouth daily.    . metoprolol succinate (TOPROL-XL) 50 MG 24 hr tablet TAKE 1 TABLET BY MOUTH EVERY DAY. TAKE WITH OR IMMEDIATELY FOLLOWING A MEAL 90 tablet 1  . montelukast (SINGULAIR) 10 MG tablet TAKE 1 TABLET(10 MG) BY MOUTH AT BEDTIME 90 tablet 3  . Omega-3 Fatty Acids (FISH OIL) 1000 MG CAPS Take 1,000 mg by mouth daily.     . pantoprazole (PROTONIX) 40 MG tablet TAKE 1 TABLET(40 MG) BY MOUTH DAILY (Patient taking differently: Take 40 mg by mouth daily. ) 90 tablet 3  . Polyethyl Glycol-Propyl Glycol (SYSTANE) 0.4-0.3 % GEL ophthalmic gel Place 1 application into both eyes every 4 (four) hours as needed (dry eyes).    Marland Kitchen PROAIR HFA 108 (90 Base) MCG/ACT inhaler INHALE 2 PUFF BY MOUTH EVERY 4 HOURS AS NEEDED USE ONLY IF YOU ARE WHEEZING (Patient taking differently: Inhale 2 puffs into the lungs every 4 (four) hours as needed for wheezing. ) 8.5 g 3  . Spacer/Aero-Holding Chambers (AEROCHAMBER PLUS) inhaler Use as instructed to use with inahaler. 1 each 1  . SYMBICORT 80-4.5 MCG/ACT inhaler INHALE 2 PUFFS INTO THE LUNGS TWICE DAILY 10.2 g 3  . torsemide (DEMADEX) 20 MG tablet Take 1 tablet (20 mg total) by mouth 2 (two) times daily. 60 tablet 2   No current facility-administered medications on file prior to visit.     Past Medical History:  Diagnosis Date  . Acute delirium 01/14/2017  . Anxiety   . Arthritis    "all over"  . Asthma   . Bipolar disorder ([place])   . Complication  of anesthesia    "w/right foot OR they gave me too much and they couldn't get me woke"  . Congestive heart failure, unspecified   . Coronary artery calcification seen on CAT scan 01/14/2017  . Coronary artery disease    "I've got 1 stent" (06/30/2015)  . Depression   . Discoid lupus   . Fibromyalgia    "I've been told I have this; can't take Lyrica cause I'm allergic to it" (06/30/2015)  . GERD (gastroesophageal reflux disease)   . History of gout   . History of hiatal hernia    "real bad" (06/30/2015)  . Hypertension   . Hypothyroidism   . Memory loss   . On home oxygen therapy    "2L q hs" (06/30/2015)  . OSA (obstructive sleep  apnea)    "just use my oxygen; no mask" (06/30/2015)  . Other and unspecified hyperlipidemia   . Penetrating foot wound    left nonhealing foot wound on the dorsal surface  . Peripheral neuropathy   . Pneumonia "many of times"  . Refusal of blood transfusions as patient is Jehovah's Witness   . Renal failure, unspecified    "kidneys not working 100%" (06/30/2015)  . Seizure ([place])   . Sickle cell trait ([place])   . Systemic lupus ([place])   . Thoracic aortic atherosclerosis ([place]) 01/14/2017  . Type II diabetes mellitus ([place])   . Unspecified vitamin D deficiency    Allergies  Allergen Reactions  . Ace Inhibitors Other (See Comments)    Coincided with sig bump in creat. Retried and creat bumped again.   . Isosorb Dinitrate-Hydralazine Other (See Comments)    Sleep all the time.   Marland Kitchen Penicillins Anaphylaxis, Swelling and Rash    Has patient had a PCN reaction causing immediate rash, facial/tongue/throat swelling, SOB or lightheadedness with hypotension: Yes Has patient had a PCN reaction causing severe rash involving mucus membranes or skin necrosis: Yes Has patient had a PCN reaction that required hospitalization: Yes Has patient had a PCN reaction occurring within the last 10 years: No If all of the above answers are "NO", then may proceed with  Cephalosporin use.   Marland Kitchen Buspirone Other (See Comments)    pain   . Pregabalin Swelling  . Ropinirole Hydrochloride Swelling  . [place] Rash    "Swelling of the tongue"    Social History   Socioeconomic History  . Marital status: Single    Spouse name: Not on file  . Number of children: 4  . Years of education: 60 th  . Highest education level: Not on file  Occupational History  . Occupation: Retired- Engineer, production    Employer: RETIRED  Tobacco Use  . Smoking status: Former Smoker    Packs/day: 3.00    Years: 40.00    Pack years: 120.00    Types: Cigarettes    Start date: 07/05/1960    Quit date: 07/05/1998    Years since quitting: 21.8  . Smokeless tobacco: Never Used  Vaping Use  . Vaping Use: Never used  Substance and Sexual Activity  . Alcohol use: No    Alcohol/week: 0.0 standard drinks    Comment: havent drank in over 40 years  . Drug use: No  . Sexual activity: Never  Other Topics Concern  . Not on file  Social History Narrative   Health Care POA:    Emergency Contact: daughter, Lorenso Courier [phone] or [name] [phone]   End of Life Plan: Does not want to be ventilated or feeding tubes.    Who lives with you: Lives alone   Any pets: none   Diet: Patient reports enjoying and eating junk food.  Does not regulate types of food and currently her dentures are broken so it is hard to eat some foods.   Exercise: Patient does not have an exercise plan.   Seatbelts: Patient reports wearing seatbelt when in vehicle.   Sun Exposure/Protection: Patient reports not going outside very often.   Hobbies: Patient enjoys reading the bible and watching game shows.    Right-handed.   1 cup caffeine per day.   Former smoker-stopped 2000   Alcohol none   Social Determinants of Radio broadcast assistant Strain:   . Difficulty of Paying Living Expenses: Not on file  Food Insecurity:   . Worried About Charity fundraiser in the Last Year: Not on file  .  Ran Out of Food in the Last Year: Not on file  Transportation Needs:   . Lack of Transportation (Medical): Not on file  . Lack of Transportation (Non-Medical): Not on file  Physical Activity:   . Days of Exercise per Week: Not on file  . Minutes of Exercise per Session: Not on file  Stress:   . Feeling of Stress : Not on file  Social Connections:   . Frequency of Communication with Friends and Family: Not on file  . Frequency of Social Gatherings with Friends and Family: Not on file  . Attends Religious Services: Not on file  . Active Member of Clubs or Organizations: Not on file  . Attends Archivist Meetings: Not on file  . Marital Status: Not on file    Vitals:   04/18/20 1206  BP: 132/80  Pulse: 91  Resp: 16  SpO2: 93%   Wt Readings from Last 3 Encounters:  04/18/20 281 lb 4 oz (127.6 kg)  03/28/20 280 lb (127 kg)  02/20/20 280 lb (127 kg)    Body mass index is 49.82 kg/m.  Physical Exam Vitals and nursing note reviewed.  Constitutional:      General: She is not in acute distress.    Appearance: She is well-developed.  HENT:     Head: Normocephalic and atraumatic.     Mouth/Throat:     Comments: Edentulous. Left TMJ pain with opening mouth, mild crepitus. No local edema,erythema,or limitation of ROM. Eyes:     Conjunctiva/sclera: Conjunctivae normal.     Pupils: Pupils are equal, round, and reactive to light.  Cardiovascular:     Rate and Rhythm: Normal rate and regular rhythm.     Heart sounds: No murmur heard.      Comments: Right JF:HLKTG is no edema or calf pain with palpation of foot dorsiflexion. Pulmonary:     Effort: Pulmonary effort is normal. No respiratory distress.     Breath sounds: Normal breath sounds.  Abdominal:     Palpations: Abdomen is soft.     Tenderness: There is no abdominal tenderness.  Genitourinary:    Comments: Does not want rectal exam. Lymphadenopathy:     Cervical: No cervical adenopathy.  Skin:    General:  Skin is warm.     Findings: No erythema or rash.     Comments: I do not appreciate lesion "knot" of concern, she could not find it either.  Neurological:     Mental Status: She is alert.  Psychiatric:        Mood and Affect: Mood is anxious. Mood is not depressed.     Comments: Well groomed, good eye contact.   ASSESSMENT AND PLAN:  Ms. Island was seen today for follow-up.  Diagnoses and all orders for this visit:  Arthralgia of left temporomandibular joint We discussed Dx. Prognosis,and treatment options. NSAID's are not an options for pain management due to chronic medical problems. Tylenol 500 mg 3-4 times per day may help. Main treatment is avoid foods that can aggravate problems.  Benign neoplasm of skin or subcutaneous tissue Today she could not find lesion of concerned but she was reassured about this being a blood clot. Most likely benign lesion. Continue monitoring.  Melena We discussed possible caused of dark/black stools. ? Upper GI bleed. Colonoscopy on 12/10/13. She is very concerned and would like GI evaluation.  -  Ambulatory referral to Gastroenterology  Morbid obesity with BMI of 45.0-49.9, adult ([place]) Wt has been stable. We discussed benefits of wt loss as well as adverse effects of obesity. Consistency with healthy diet encouraged. Physical activity as tolerated. Keep appt with nutritionist.  Gastroesophageal reflux disease without esophagitis She is not having upper GI symptoms. Continue Protonix 40 mg daily and GERD precautions.   Return if symptoms worsen or fail to improve, for Keep next f/u appt.   Leeandre Nordling G. [name], MD  [facility]. [place] office.    A few things to remember from today's visit:  On examination today I do not see any thing that suggest a blood clot. No masses found on exam. GI referral placed. Continue watching your diet and keep appt with nutritionist.    Temporomandibular Joint  Syndrome  Temporomandibular joint syndrome (TMJ syndrome) is a condition that causes pain in the temporomandibular joints. These joints are located near your ears and allow your jaw to open and close. For people with TMJ syndrome, chewing, biting, or other movements of the jaw can be difficult or painful. TMJ syndrome is often mild and goes away within a few weeks. However, sometimes the condition becomes a long-term (chronic) problem. What are the causes? This condition may be caused by:  Grinding your teeth or clenching your jaw. Some people do this when they are under stress.  Arthritis.  Injury to the jaw.  Head or neck injury.  Teeth or dentures that are not aligned well. In some cases, the cause of TMJ syndrome may not be known. What are the signs or symptoms? The most common symptom of this condition is an aching pain on the side of the head in the area of the TMJ. Other symptoms may include:  Pain when moving your jaw, such as when chewing or biting.  Being unable to open your jaw all the way.  Making a clicking sound when you open your mouth.  Headache.  Earache.  Neck or shoulder pain. How is this diagnosed? This condition may be diagnosed based on:  Your symptoms and medical history.  A physical exam. Your health care provider may check the range of motion of your jaw.  Imaging tests, such as X-rays or an MRI. You may also need to see your dentist, who will determine if your teeth and jaw are lined up correctly. How is this treated? TMJ syndrome often goes away on its own. If treatment is needed, the options may include:  Eating soft foods and applying ice or heat.  Medicines to relieve pain or inflammation.  Medicines or massage to relax the muscles.  A splint, bite plate, or mouthpiece to prevent teeth grinding or jaw clenching.  Relaxation techniques or counseling to help reduce stress.  A therapy for pain in which an electrical current is applied to  the nerves through the skin (transcutaneous electrical nerve stimulation).  Acupuncture. This is sometimes helpful to relieve pain.  Jaw surgery. This is rarely needed. Follow these instructions at home:  Eating and drinking  Eat a soft diet if you are having trouble chewing.  Avoid foods that require a lot of chewing. Do not chew gum. General instructions  Take over-the-counter and prescription medicines only as told by your health care provider.  If directed, put ice on the painful area. ? Put ice in a plastic bag. ? Place a towel between your skin and the bag. ? Leave the ice on for 20 minutes, 2-3 times a  day.  Apply a warm, wet cloth (warm compress) to the painful area as directed.  Massage your jaw area and do any jaw stretching exercises as told by your health care provider.  If you were given a splint, bite plate, or mouthpiece, wear it as told by your health care provider.  Keep all follow-up visits as told by your health care provider. This is important. Contact a health care provider if:  You are having trouble eating.  You have new or worsening symptoms. Get help right away if:  Your jaw locks open or closed. Summary  Temporomandibular joint syndrome (TMJ syndrome) is a condition that causes pain in the temporomandibular joints. These joints are located near your ears and allow your jaw to open and close.  TMJ syndrome is often mild and goes away within a few weeks. However, sometimes the condition becomes a long-term (chronic) problem.  Symptoms include an aching pain on the side of the head in the area of the TMJ, pain when chewing or biting, and being unable to open your jaw all the way. You may also make a clicking sound when you open your mouth.  TMJ syndrome often goes away on its own. If treatment is needed, it may include medicines to relieve pain, reduce inflammation, or relax the muscles. A splint, bite plate, or mouthpiece may also be used to prevent  teeth grinding or jaw clenching. This information is not intended to replace advice given to you by your health care provider. Make sure you discuss any questions you have with your health care provider. Document Revised: 09/02/2017 Document Reviewed: 08/02/2017 Elsevier Patient Education  [employer].  If you need refills please call your pharmacy. Do not use My Chart to request refills or for acute issues that need immediate attention.    Please be sure medication list is accurate. If a new problem present, please set up appointment sooner than planned today.

## 2020-04-20 ENCOUNTER — Encounter: Payer: Self-pay | Admitting: Family Medicine

## 2020-04-28 ENCOUNTER — Encounter: Payer: Self-pay | Admitting: Gastroenterology

## 2020-04-28 ENCOUNTER — Telehealth: Payer: Self-pay | Admitting: Family Medicine

## 2020-04-28 DIAGNOSIS — K921 Melena: Secondary | ICD-10-CM

## 2020-04-28 NOTE — Telephone Encounter (Signed)
The patients daughter called to see if we can schedule labs for Iron and CBC levels   The home nurse was at the house today and the patients breathing was lower than normal and they can't get into [facility] until 06/12/2020.  The patient has had problems with black stool.  Her last labs for Iron and CBC was 02/20/2020.  Please advise

## 2020-04-29 ENCOUNTER — Telehealth: Payer: Self-pay | Admitting: Family Medicine

## 2020-04-29 NOTE — Telephone Encounter (Signed)
I spoke with Dr. [name] - verbal obtained to order cbc & iron. Spoke with pt's daughter, she is aware lab orders are placed & appointment made for tomorrow at 8:20. She is aware we will call them once the results are back. Advised black stools are common when taking iron supplements. Pt's daughter verbalized understanding.

## 2020-04-29 NOTE — Addendum Note (Signed)
Addended by: Rodrigo Ran on: 04/29/2020 02:46 PM   Modules accepted: Orders

## 2020-04-29 NOTE — Telephone Encounter (Signed)
See other phone note

## 2020-04-29 NOTE — Telephone Encounter (Signed)
Pt is calling in stating that since she had been in the office her stool has been dark in color (black) and goes back to being brown and back to being black only when she wipes herself.  Pt would like to have a call back.

## 2020-04-30 ENCOUNTER — Other Ambulatory Visit (INDEPENDENT_AMBULATORY_CARE_PROVIDER_SITE_OTHER): Payer: Medicare Other

## 2020-04-30 ENCOUNTER — Other Ambulatory Visit: Payer: Self-pay

## 2020-04-30 ENCOUNTER — Other Ambulatory Visit: Payer: Self-pay | Admitting: Family Medicine

## 2020-04-30 DIAGNOSIS — K921 Melena: Secondary | ICD-10-CM

## 2020-04-30 DIAGNOSIS — E1149 Type 2 diabetes mellitus with other diabetic neurological complication: Secondary | ICD-10-CM

## 2020-05-01 ENCOUNTER — Other Ambulatory Visit: Payer: Self-pay | Admitting: Family Medicine

## 2020-05-01 LAB — CBC WITH DIFFERENTIAL/PLATELET
Absolute Monocytes: 984 cells/uL — ABNORMAL HIGH (ref 200–950)
Basophils Absolute: 80 cells/uL (ref 0–200)
Basophils Relative: 0.6 %
Eosinophils Absolute: 439 cells/uL (ref 15–500)
Eosinophils Relative: 3.3 %
HCT: 35.7 % (ref 35.0–45.0)
Hemoglobin: 11.4 g/dL — ABNORMAL LOW (ref 11.7–15.5)
Lymphs Abs: 3684 cells/uL (ref 850–3900)
MCH: 25 pg — ABNORMAL LOW (ref 27.0–33.0)
MCHC: 31.9 g/dL — ABNORMAL LOW (ref 32.0–36.0)
MCV: 78.3 fL — ABNORMAL LOW (ref 80.0–100.0)
MPV: 11.8 fL (ref 7.5–12.5)
Monocytes Relative: 7.4 %
Neutro Abs: 8113 cells/uL — ABNORMAL HIGH (ref 1500–7800)
Neutrophils Relative %: 61 %
Platelets: 227 10*3/uL (ref 140–400)
RBC: 4.56 10*6/uL (ref 3.80–5.10)
RDW: 16.9 % — ABNORMAL HIGH (ref 11.0–15.0)
Total Lymphocyte: 27.7 %
WBC: 13.3 10*3/uL — ABNORMAL HIGH (ref 3.8–10.8)

## 2020-05-01 LAB — IRON, TOTAL/TOTAL IRON BINDING CAP
%SAT: 23 % (calc) (ref 16–45)
Iron: 52 ug/dL (ref 45–160)
TIBC: 229 mcg/dL (calc) — ABNORMAL LOW (ref 250–450)

## 2020-05-05 DIAGNOSIS — I10 Essential (primary) hypertension: Secondary | ICD-10-CM | POA: Diagnosis not present

## 2020-05-05 DIAGNOSIS — I5032 Chronic diastolic (congestive) heart failure: Secondary | ICD-10-CM | POA: Diagnosis not present

## 2020-05-05 DIAGNOSIS — J45909 Unspecified asthma, uncomplicated: Secondary | ICD-10-CM | POA: Diagnosis not present

## 2020-05-08 ENCOUNTER — Telehealth: Payer: Self-pay | Admitting: Family Medicine

## 2020-05-08 NOTE — Telephone Encounter (Signed)
Patient daugher Brittany Roberts calling for test results. Please call [phone]

## 2020-05-12 ENCOUNTER — Telehealth: Payer: Self-pay | Admitting: Family Medicine

## 2020-05-12 MED ORDER — ACCU-CHEK AVIVA PLUS W/DEVICE KIT: PACK | 1 refills | Status: DC

## 2020-05-12 MED ORDER — ACCU-CHEK SOFTCLIX LANCETS MISC: 12 refills | Status: DC

## 2020-05-12 MED ORDER — ACCU-CHEK AVIVA PLUS VI STRP: ORAL_STRIP | 12 refills | Status: DC

## 2020-05-12 NOTE — Telephone Encounter (Signed)
Rx's sent in, Pt's daughter is aware.

## 2020-05-12 NOTE — Telephone Encounter (Signed)
See result note encounter

## 2020-05-12 NOTE — Telephone Encounter (Signed)
Pt is needing a new Rx for Accu-chek kit with everything.  Pt has not been able to check her blood sugars for over a week.  Pharm:  [facility] on [address].  Pts daughter is also wanting a call to get the pts lab results.

## 2020-05-14 ENCOUNTER — Other Ambulatory Visit: Payer: Self-pay

## 2020-05-14 DIAGNOSIS — M25869 Other specified joint disorders, unspecified knee: Secondary | ICD-10-CM | POA: Diagnosis not present

## 2020-05-14 DIAGNOSIS — I10 Essential (primary) hypertension: Secondary | ICD-10-CM | POA: Diagnosis not present

## 2020-05-14 DIAGNOSIS — J45909 Unspecified asthma, uncomplicated: Secondary | ICD-10-CM | POA: Diagnosis not present

## 2020-05-14 MED ORDER — ACCU-CHEK GUIDE W/DEVICE KIT: PACK | 1 refills | Status: DC

## 2020-05-14 MED ORDER — ACCU-CHEK GUIDE VI STRP: ORAL_STRIP | 12 refills | Status: DC

## 2020-05-22 ENCOUNTER — Encounter: Payer: Self-pay | Admitting: Dietician

## 2020-05-22 ENCOUNTER — Encounter: Payer: Medicare Other | Attending: Family Medicine | Admitting: Dietician

## 2020-05-22 DIAGNOSIS — E118 Type 2 diabetes mellitus with unspecified complications: Secondary | ICD-10-CM | POA: Insufficient documentation

## 2020-05-22 NOTE — Patient Instructions (Addendum)
Make mindful meal and snack choices. What foods could you eat rather than the higher carbohydrate desserts if you are hungry?  Or could you have these less often and smaller portions?  Sugar free jello, sugar free popsicles, sugar free pudding  Yogurt, applesauce  Rotate insulin injection site Recommend avoiding dark soda.  Diet Mt. Dew would be OK occasionally.  Please call me for any questions or concerns. It was good to meet you!

## 2020-05-22 NOTE — Progress Notes (Addendum)
Diabetes Self-Management Education  Visit Type: First/Initial  Appt. Start Time: 0905 Appt. End Time: 1005  05/25/2020  [name], identified by name and date of birth, is a 79 y.o. female with a diagnosis of Diabetes: Type 2.   ASSESSMENT This is a virtual appointment.  Her daughter Brittany Roberts is present. Daughter states that she is unable to eat many healthy foods because she is unable to wear her teeth due to sore gums.  Current high intake of desserts that she purchases or bakes.  History includes Type 2 Diabetesm Home Oxygen, HTN, CKD, GERD, CHF, depression. Medications include:  Vitamin B-complex, omega 3, Humulin N 15 units each am and 10 units every HS, Steroids about 1 month ago for gout per daughter. Blood glucose 224-348 recently and usually 145-180 fasting Labs:  7.7% 03/14/2020, eGFR 30, Potassium 3.8 12/11/2019  Patient lives alone.  Her oldest daughter lives across the street.  Patient cooks her own breakfast and her daughters prepare her other meals. Her family takes her shopping.  Patient will purchase sweets. They do not cook with or add salt.  Height 5' 3.5" (1.613 m), weight 279 lb (126.6 kg). Body mass index is 48.65 kg/m.   Diabetes Self-Management Education - 05/22/20 0920      Visit Information   Visit Type First/Initial      Initial Visit   Diabetes Type Type 2    Are you currently following a meal plan? No    Are you taking your medications as prescribed? Yes      Health Coping   How would you rate your overall health? Fair      Psychosocial Assessment   Patient Belief/Attitude about Diabetes Other (comment)   frustrated   Self-care barriers Debilitated state due to current medical condition    Self-management support Doctor's office;Family    Other persons present Patient;Family Member    Patient Concerns Nutrition/Meal planning;Glycemic Control    Special Needs Instruct caregiver    Learning Readiness Contemplating    How often do you need to  have someone help you when you read instructions, pamphlets, or other written materials from your doctor or pharmacy? 5 - Always      Pre-Education Assessment   Patient understands the diabetes disease and treatment process. Needs Review    Patient understands incorporating nutritional management into lifestyle. Needs Review    Patient undertands incorporating physical activity into lifestyle. Needs Review    Patient understands using medications safely. Needs Review    Patient understands monitoring blood glucose, interpreting and using results Needs Review    Patient understands prevention, detection, and treatment of acute complications. Needs Review    Patient understands prevention, detection, and treatment of chronic complications. Needs Review    Patient understands how to develop strategies to address psychosocial issues. Needs Review    Patient understands how to develop strategies to promote health/change behavior. Needs Review      Complications   Last HgB A1C per patient/outside source 7.7 %   02/12/2020   How often do you check your blood sugar? 1-2 times/day    Fasting Blood glucose range (mg/dL) >200   227-348 recently increased from usual of 145-180 due to increased desserts   Number of hypoglycemic episodes per month 0    Have you had a dilated eye exam in the past 12 months? Yes    Have you had a dental exam in the past 12 months? No    Are you checking your feet?  Yes    How many days per week are you checking your feet? 5      Dietary Intake   Breakfast [employer] Oatmeal, apple OR applesauce and chicken biscuit OR cheese and eggs, bread, grits    Lunch PB & Jelly Sandwich OR homemade soup OR leftovers    Snack (afternoon) honey bun or cake or peach cobbler    Dinner chicken OR short ribs, greens, potatoes or sweet potatoes OR salad    Snack (evening) honey buns or cake or peach cobbler    Beverage(s) water, diet Pepsi or diet Coke,      Exercise   Exercise Type  ADL's      Patient Education   Previous Diabetes Education No    Nutrition management  Meal options for control of blood glucose level and chronic complications.;Role of diet in the treatment of diabetes and the relationship between the three main macronutrients and blood glucose level    Medications Reviewed patients medication for diabetes, action, purpose, timing of dose and side effects.;Taught/reviewed insulin injection, site rotation, insulin storage and needle disposal.    Monitoring Purpose and frequency of SMBG.;Taught/discussed recording of test results and interpretation of SMBG.    Acute complications Taught treatment of hypoglycemia - the 15 rule.    Psychosocial adjustment Identified and addressed patients feelings and concerns about diabetes      Individualized Goals (developed by patient)   Nutrition General guidelines for healthy choices and portions discussed    Medications take my medication as prescribed    Monitoring  test my blood glucose as discussed    Reducing Risk examine blood glucose patterns;do foot checks daily;treat hypoglycemia with 15 grams of carbs if blood glucose less than 79m/dL;increase portions of healthy fats;Other (comment)   continue low sodium diet     Post-Education Assessment   Patient understands the diabetes disease and treatment process. Demonstrates understanding / competency    Patient understands incorporating nutritional management into lifestyle. Demonstrates understanding / competency    Patient undertands incorporating physical activity into lifestyle. Demonstrates understanding / competency    Patient understands using medications safely. Demonstrates understanding / competency    Patient understands monitoring blood glucose, interpreting and using results Demonstrates understanding / competency    Patient understands prevention, detection, and treatment of acute complications. Demonstrates understanding / competency    Patient  understands prevention, detection, and treatment of chronic complications. Demonstrates understanding / competency    Patient understands how to develop strategies to address psychosocial issues. Demonstrates understanding / competency    Patient understands how to develop strategies to promote health/change behavior. Demonstrates understanding / competency      Outcomes   Expected Outcomes Demonstrated limited interest in learning.  Expect minimal changes    Future DMSE PRN    Program Status Completed           Individualized Plan for Diabetes Self-Management Training:   Learning Objective:  Patient will have a greater understanding of diabetes self-management. Patient education plan is to attend individual and/or group sessions per assessed needs and concerns.   Plan:   Patient Instructions  Make mindful meal and snack choices. What foods could you eat rather than the higher carbohydrate desserts if you are hungry?  Or could you have these less often and smaller portions?  Rotate insulin injection site Recommend avoiding dark soda.  Diet Mt. Dew would be OK occasionally.  Please call me for any questions or concerns. It was good to meet you!  Expected Outcomes:  Demonstrated limited interest in learning.  Expect minimal changes  Education material provided: Snack sheet, Diabetes medications, My Placemat for Diabetes,   If problems or questions, patient to contact team via:  Phone  Future DSME appointment: PRN

## 2020-06-03 ENCOUNTER — Other Ambulatory Visit: Payer: Self-pay | Admitting: Family Medicine

## 2020-06-03 ENCOUNTER — Other Ambulatory Visit: Payer: Self-pay | Admitting: Neurology

## 2020-06-05 ENCOUNTER — Emergency Department (HOSPITAL_COMMUNITY): Payer: Medicare Other

## 2020-06-05 ENCOUNTER — Other Ambulatory Visit: Payer: Self-pay

## 2020-06-05 ENCOUNTER — Emergency Department (HOSPITAL_COMMUNITY)
Admission: EM | Admit: 2020-06-05 | Discharge: 2020-06-05 | Disposition: A | Discharge status: Home / Self Care | Payer: Medicare Other | Source: Home / Self Care | Attending: Emergency Medicine | Admitting: Emergency Medicine

## 2020-06-05 DIAGNOSIS — I10 Essential (primary) hypertension: Secondary | ICD-10-CM | POA: Diagnosis not present

## 2020-06-05 DIAGNOSIS — I2510 Atherosclerotic heart disease of native coronary artery without angina pectoris: Secondary | ICD-10-CM | POA: Insufficient documentation

## 2020-06-05 DIAGNOSIS — R410 Disorientation, unspecified: Secondary | ICD-10-CM

## 2020-06-05 DIAGNOSIS — I517 Cardiomegaly: Secondary | ICD-10-CM | POA: Diagnosis not present

## 2020-06-05 DIAGNOSIS — J9611 Chronic respiratory failure with hypoxia: Secondary | ICD-10-CM | POA: Diagnosis not present

## 2020-06-05 DIAGNOSIS — M109 Gout, unspecified: Secondary | ICD-10-CM | POA: Diagnosis not present

## 2020-06-05 DIAGNOSIS — Z20822 Contact with and (suspected) exposure to covid-19: Secondary | ICD-10-CM | POA: Insufficient documentation

## 2020-06-05 DIAGNOSIS — Z87891 Personal history of nicotine dependence: Secondary | ICD-10-CM | POA: Insufficient documentation

## 2020-06-05 DIAGNOSIS — E860 Dehydration: Secondary | ICD-10-CM | POA: Insufficient documentation

## 2020-06-05 DIAGNOSIS — Z8616 Personal history of COVID-19: Secondary | ICD-10-CM | POA: Insufficient documentation

## 2020-06-05 DIAGNOSIS — J449 Chronic obstructive pulmonary disease, unspecified: Secondary | ICD-10-CM | POA: Insufficient documentation

## 2020-06-05 DIAGNOSIS — J320 Chronic maxillary sinusitis: Secondary | ICD-10-CM | POA: Diagnosis not present

## 2020-06-05 DIAGNOSIS — F0390 Unspecified dementia without behavioral disturbance: Secondary | ICD-10-CM | POA: Insufficient documentation

## 2020-06-05 DIAGNOSIS — Z9889 Other specified postprocedural states: Secondary | ICD-10-CM | POA: Diagnosis not present

## 2020-06-05 DIAGNOSIS — G9389 Other specified disorders of brain: Secondary | ICD-10-CM | POA: Diagnosis not present

## 2020-06-05 DIAGNOSIS — N1832 Chronic kidney disease, stage 3b: Secondary | ICD-10-CM | POA: Diagnosis not present

## 2020-06-05 DIAGNOSIS — Z825 Family history of asthma and other chronic lower respiratory diseases: Secondary | ICD-10-CM | POA: Diagnosis not present

## 2020-06-05 DIAGNOSIS — Z9981 Dependence on supplemental oxygen: Secondary | ICD-10-CM | POA: Insufficient documentation

## 2020-06-05 DIAGNOSIS — E1165 Type 2 diabetes mellitus with hyperglycemia: Secondary | ICD-10-CM | POA: Diagnosis not present

## 2020-06-05 DIAGNOSIS — E1140 Type 2 diabetes mellitus with diabetic neuropathy, unspecified: Secondary | ICD-10-CM | POA: Insufficient documentation

## 2020-06-05 DIAGNOSIS — I5043 Acute on chronic combined systolic (congestive) and diastolic (congestive) heart failure: Secondary | ICD-10-CM | POA: Insufficient documentation

## 2020-06-05 DIAGNOSIS — E039 Hypothyroidism, unspecified: Secondary | ICD-10-CM | POA: Insufficient documentation

## 2020-06-05 DIAGNOSIS — G4733 Obstructive sleep apnea (adult) (pediatric): Secondary | ICD-10-CM | POA: Diagnosis not present

## 2020-06-05 DIAGNOSIS — N179 Acute kidney failure, unspecified: Secondary | ICD-10-CM | POA: Diagnosis not present

## 2020-06-05 DIAGNOSIS — J90 Pleural effusion, not elsewhere classified: Secondary | ICD-10-CM | POA: Diagnosis not present

## 2020-06-05 DIAGNOSIS — E049 Nontoxic goiter, unspecified: Secondary | ICD-10-CM | POA: Diagnosis not present

## 2020-06-05 DIAGNOSIS — J45909 Unspecified asthma, uncomplicated: Secondary | ICD-10-CM | POA: Diagnosis not present

## 2020-06-05 DIAGNOSIS — N390 Urinary tract infection, site not specified: Secondary | ICD-10-CM | POA: Diagnosis not present

## 2020-06-05 DIAGNOSIS — Z794 Long term (current) use of insulin: Secondary | ICD-10-CM | POA: Insufficient documentation

## 2020-06-05 DIAGNOSIS — Z66 Do not resuscitate: Secondary | ICD-10-CM | POA: Diagnosis not present

## 2020-06-05 DIAGNOSIS — Z7951 Long term (current) use of inhaled steroids: Secondary | ICD-10-CM | POA: Insufficient documentation

## 2020-06-05 DIAGNOSIS — Z79899 Other long term (current) drug therapy: Secondary | ICD-10-CM | POA: Insufficient documentation

## 2020-06-05 DIAGNOSIS — R001 Bradycardia, unspecified: Secondary | ICD-10-CM | POA: Diagnosis not present

## 2020-06-05 DIAGNOSIS — I491 Atrial premature depolarization: Secondary | ICD-10-CM | POA: Diagnosis not present

## 2020-06-05 DIAGNOSIS — I130 Hypertensive heart and chronic kidney disease with heart failure and stage 1 through stage 4 chronic kidney disease, or unspecified chronic kidney disease: Secondary | ICD-10-CM | POA: Insufficient documentation

## 2020-06-05 DIAGNOSIS — E1122 Type 2 diabetes mellitus with diabetic chronic kidney disease: Secondary | ICD-10-CM | POA: Insufficient documentation

## 2020-06-05 DIAGNOSIS — N184 Chronic kidney disease, stage 4 (severe): Secondary | ICD-10-CM | POA: Insufficient documentation

## 2020-06-05 DIAGNOSIS — I429 Cardiomyopathy, unspecified: Secondary | ICD-10-CM | POA: Diagnosis not present

## 2020-06-05 DIAGNOSIS — J811 Chronic pulmonary edema: Secondary | ICD-10-CM | POA: Diagnosis not present

## 2020-06-05 DIAGNOSIS — R55 Syncope and collapse: Secondary | ICD-10-CM | POA: Diagnosis not present

## 2020-06-05 DIAGNOSIS — I5032 Chronic diastolic (congestive) heart failure: Secondary | ICD-10-CM | POA: Diagnosis not present

## 2020-06-05 DIAGNOSIS — R000 Tachycardia, unspecified: Secondary | ICD-10-CM | POA: Diagnosis not present

## 2020-06-05 DIAGNOSIS — R4182 Altered mental status, unspecified: Secondary | ICD-10-CM | POA: Diagnosis not present

## 2020-06-05 DIAGNOSIS — K219 Gastro-esophageal reflux disease without esophagitis: Secondary | ICD-10-CM | POA: Diagnosis not present

## 2020-06-05 DIAGNOSIS — M329 Systemic lupus erythematosus, unspecified: Secondary | ICD-10-CM | POA: Diagnosis not present

## 2020-06-05 DIAGNOSIS — G9341 Metabolic encephalopathy: Secondary | ICD-10-CM | POA: Diagnosis not present

## 2020-06-05 DIAGNOSIS — I4891 Unspecified atrial fibrillation: Secondary | ICD-10-CM | POA: Diagnosis not present

## 2020-06-05 DIAGNOSIS — Z951 Presence of aortocoronary bypass graft: Secondary | ICD-10-CM | POA: Insufficient documentation

## 2020-06-05 DIAGNOSIS — E785 Hyperlipidemia, unspecified: Secondary | ICD-10-CM | POA: Diagnosis not present

## 2020-06-05 LAB — ETHANOL: Alcohol, Ethyl (B): <10 mg/dL (ref <10)

## 2020-06-05 LAB — URINALYSIS, ROUTINE W REFLEX MICROSCOPIC
Bilirubin Urine: NEGATIVE
Glucose, UA: NEGATIVE mg/dL
Hgb urine dipstick: NEGATIVE
Ketones, ur: NEGATIVE mg/dL
Nitrite: NEGATIVE
Protein, ur: NEGATIVE mg/dL
Specific Gravity, Urine: 1.009 (ref 1.005–1.030)
pH: 5 (ref 5.0–8.0)

## 2020-06-05 LAB — CBC WITH DIFFERENTIAL/PLATELET
Abs Immature Granulocytes: 0.05 10*3/uL (ref 0.00–0.07)
Basophils Absolute: 0.1 10*3/uL (ref 0.0–0.1)
Basophils Relative: 1 %
Eosinophils Absolute: 0.2 10*3/uL (ref 0.0–0.5)
Eosinophils Relative: 2 %
HCT: 31.5 % — ABNORMAL LOW (ref 36.0–46.0)
Hemoglobin: 10.7 g/dL — ABNORMAL LOW (ref 12.0–15.0)
Immature Granulocytes: 0 %
Lymphocytes Relative: 20 %
Lymphs Abs: 2.5 10*3/uL (ref 0.7–4.0)
MCH: 25.8 pg — ABNORMAL LOW (ref 26.0–34.0)
MCHC: 34 g/dL (ref 30.0–36.0)
MCV: 75.9 fL — ABNORMAL LOW (ref 80.0–100.0)
Monocytes Absolute: 1 10*3/uL (ref 0.1–1.0)
Monocytes Relative: 7 %
Neutro Abs: 9.2 10*3/uL — ABNORMAL HIGH (ref 1.7–7.7)
Neutrophils Relative %: 70 %
Platelets: 180 10*3/uL (ref 150–400)
RBC: 4.15 MIL/uL (ref 3.87–5.11)
RDW: 15.6 % — ABNORMAL HIGH (ref 11.5–15.5)
WBC: 13 10*3/uL — ABNORMAL HIGH (ref 4.0–10.5)
nRBC: 0.2 % (ref 0.0–0.2)

## 2020-06-05 LAB — COMPREHENSIVE METABOLIC PANEL
ALT: 18 U/L (ref 0–44)
AST: 25 U/L (ref 15–41)
Albumin: 3.5 g/dL (ref 3.5–5.0)
Alkaline Phosphatase: 105 U/L (ref 38–126)
Anion gap: 11 (ref 5–15)
BUN: 69 mg/dL — ABNORMAL HIGH (ref 8–23)
CO2: 27 mmol/L (ref 22–32)
Calcium: 11.2 mg/dL — ABNORMAL HIGH (ref 8.9–10.3)
Chloride: 101 mmol/L (ref 98–111)
Creatinine, Ser: 2.51 mg/dL — ABNORMAL HIGH (ref 0.44–1.00)
GFR, Estimated: 19 mL/min — ABNORMAL LOW (ref >60)
Glucose, Bld: 226 mg/dL — ABNORMAL HIGH (ref 70–99)
Potassium: 3.6 mmol/L (ref 3.5–5.1)
Sodium: 139 mmol/L (ref 135–145)
Total Bilirubin: 0.8 mg/dL (ref 0.3–1.2)
Total Protein: 7.7 g/dL (ref 6.5–8.1)

## 2020-06-05 LAB — LACTIC ACID, PLASMA: Lactic Acid, Venous: 1.2 mmol/L (ref 0.5–1.9)

## 2020-06-05 MED ORDER — SODIUM CHLORIDE 0.9 % IV BOLUS
1000.0000 mL | Freq: Once | INTRAVENOUS | Status: AC
  Administered 2020-06-05: 1000 mL via INTRAVENOUS

## 2020-06-05 MED ORDER — SULFAMETHOXAZOLE-TRIMETHOPRIM 800-160 MG PO TABS
1.0000 | ORAL_TABLET | Freq: Once | ORAL | Status: AC
  Administered 2020-06-05: 1 via ORAL
  Filled 2020-06-05: qty 1

## 2020-06-05 MED ORDER — CEPHALEXIN 250 MG PO CAPS: 500.0000 mg | ORAL_CAPSULE | Freq: Two times a day (BID) | ORAL | 0 refills | Status: DC

## 2020-06-05 MED ORDER — SODIUM CHLORIDE 0.9 % IV SOLN: INTRAVENOUS | Status: DC

## 2020-06-05 NOTE — ED Notes (Signed)
Discharge paperwork reviewed with pt and pts daughter.  Daughter stated that she did not have oxygen tank for pt, but pt will be fine to go home without it as they only live appx 5 min. Pt agreeable to this plan.  Pt with no further questions at time of discharge, assisted by NT into wheelchair and taken to ED entrance to meet with daughter.

## 2020-06-05 NOTE — ED Provider Notes (Signed)
[facility] DEPT Provider Note   CSN: [id number] Arrival date & time: 06/05/20  1035     History Chief Complaint  Patient presents with  . Altered Mental Status    Brittany Roberts is a 79 y.o. female.  HPI   L relief obese female with history of dementia presents from home via EMS after home health nursing found her to be confused beyond baseline.  Patient does have oxygen dependency, has seemingly been wearing it, though details are unclear, level 5 caveat secondary to the patient's dementia.  The patient herself denies pain, denies complaints, denies weakness, but again, cannot provide additional details of HPI. Per EMS the patient was having difficulty getting out of her recliner, possibly difficulty using the remote control, and was more confused beyond baseline. Reportedly the patient has had some black stool for several months, and is seeing GI next week.  She is also reportedly on oxygen 3 L, 24/7.  Past Medical History:  Diagnosis Date  . Acute delirium 01/14/2017  . Anxiety   . Arthritis    "all over"  . Asthma   . Bipolar disorder ([place])   . Complication of anesthesia    "w/right foot OR they gave me too much and they couldn't get me woke"  . Congestive heart failure, unspecified   . Coronary artery calcification seen on CAT scan 01/14/2017  . Coronary artery disease    "I've got 1 stent" (06/30/2015)  . Depression   . Discoid lupus   . Fibromyalgia    "I've been told I have this; can't take Lyrica cause I'm allergic to it" (06/30/2015)  . GERD (gastroesophageal reflux disease)   . History of gout   . History of hiatal hernia    "real bad" (06/30/2015)  . Hypertension   . Hypothyroidism   . Memory loss   . On home oxygen therapy    "2L q hs" (06/30/2015)  . OSA (obstructive sleep apnea)    "just use my oxygen; no mask" (06/30/2015)  . Other and unspecified hyperlipidemia   . Penetrating foot wound    left nonhealing foot wound on  the dorsal surface  . Peripheral neuropathy   . Pneumonia "many of times"  . Refusal of blood transfusions as patient is Jehovah's Witness   . Renal failure, unspecified    "kidneys not working 100%" (06/30/2015)  . Seizure ([place])   . Sickle cell trait ([place])   . Systemic lupus ([place])   . Thoracic aortic atherosclerosis ([place]) 01/14/2017  . Type II diabetes mellitus ([place])   . Unspecified vitamin D deficiency     Patient Active Problem List   Diagnosis Date Noted  . Hypomagnesemia 02/20/2020  . Chronic respiratory failure with hypoxia, on home O2 therapy ([place]) 02/20/2020  . Monitoring for long-term anticoagulant use 12/17/2019  . LV (left ventricular) mural thrombus 12/11/2019  . Acute on chronic diastolic CHF (congestive heart failure) ([place]) 12/06/2019  . Pneumonia due to COVID-19 virus 11/01/2019  . Respiratory failure ([place]) 11/01/2019  . Memory loss 04/05/2019  . Cerebrovascular accident (CVA) ([place]) 04/05/2019  . Seizures ([place]) 04/05/2019  . Obstructive sleep apnea 04/05/2019  . Chronic heart failure with preserved ejection fraction ([place]) 01/17/2019  . OP (osteoporosis) 01/01/2019  . Abdominal wall pain in right flank 08/13/2018  . Alopecia, scarring 08/11/2018  . Chronic right shoulder pain 03/15/2018  . Bilateral primary osteoarthritis of knee 02/09/2018  . Pain in right ankle and joints of right foot 02/09/2018  .  Acute exacerbation of congestive heart failure ([place]) 12/13/2017  . Chronic kidney disease (CKD), stage IV (severe) ([place]) 09/01/2017  . Gout, arthropathy 09/01/2017  . Impacted cerumen of both ears 05/13/2017  . Sensorineural hearing loss, bilateral 05/13/2017  . Localization-related symptomatic epilepsy and epileptic syndromes with complex partial seizures, not intractable, without status epilepticus ([place]) 04/25/2017  . Fibromyalgia 04/08/2017  . Allergic rhinitis 03/11/2017  . Cardiomyopathy ([place]) 01/22/2017  . Hyperlipidemia associated with type 2 diabetes  mellitus ([place]) 01/22/2017  . Bipolar disorder ([place]) 01/22/2017  . Seizure ([place])   . Thoracic aortic atherosclerosis ([place]) 01/14/2017  . Coronary artery calcification seen on CAT scan 01/14/2017  . Acute delirium 01/14/2017  . Possible Tonic-clonic seizure disorder ([place]) 01/14/2017  . CVA (cerebral vascular accident) ([place]) 01/14/2017  . Altered mental status 01/13/2017  . Mastalgia 04/20/2016  . Bilateral leg edema 02/10/2016  . Difficulty hearing 10/30/2015  . Midline low back pain with right-sided sciatica 09/25/2015  . Carpal tunnel syndrome on left 08/14/2015  . Chest pain of uncertain etiology 36/64/4034  . Primary localized osteoarthrosis, hand 06/19/2015  . Radial styloid tenosynovitis 06/19/2015  . Dysarthria 02/14/2015  . Type 2 diabetes mellitus with complication ([place])   . Cephalalgia   . Hx of systemic lupus erythematosus (SLE) ([place])   . Thyroid mass 04/26/2014  . Constipation 12/10/2013  . Benign neoplasm of colon 12/10/2013  . Type 2 diabetes mellitus with diabetic neuropathy, unspecified ([place]) 11/27/2013  . Renal cyst, right 11/09/2013  . Insulin dependent type 2 diabetes mellitus ([place]) 10/29/2013  . Hoarseness or changing voice 09/27/2013  . Dyskinesia, tardive 07/11/2013  . TMJ syndrome 02/16/2013  . Angina pectoris associated with type 2 diabetes mellitus ([place]) 08/08/2012  . Coronary artery disease of native artery of native heart with stable angina pectoris ([place]) 08/08/2012  . Asthma, chronic, PRESUMED wiht MULTIFACTORIAL DYSPNEA 05/30/2012  . Insomnia 09/22/2011  . Mobility poor 02/02/2011  . Chronic kidney disease, stage III (moderate) ([place]) 05/13/2010  . Chronic systolic (congestive) heart failure ([place]) 03/31/2010  . COPD (chronic obstructive pulmonary disease) ([place]) 03/26/2010  . Morbid obesity ([place]) 02/27/2010  . Unstable gait 02/27/2010  . DM (diabetes mellitus), type 2 with neurological complications ([place]) 74/25/9563  . Arthritis 04/25/2009  . Anemia  03/11/2007  . COGNITIVE IMPAIRMENT, MILD, SO STATED 03/11/2007  . RESTLESS LEG SYNDROME 03/11/2007  . Lupus ([place]) 03/11/2007  . OSTEOARTHROSIS, GENERALIZED, MULTIPLE SITES 03/11/2007  . Hypothyroidism 03/07/2007  . Hypercholesteremia 03/07/2007  . Depression 03/07/2007  . Hypertensive heart disease with heart failure ([place]) 03/07/2007  . Coronary atherosclerosis 03/07/2007  . GERD 03/07/2007  . SLEEP APNEA 03/07/2007    Past Surgical History:  Procedure Laterality Date  . ANKLE ARTHROSCOPY WITH OPEN REDUCTION INTERNAL FIXATION (ORIF) Right 03/2011  . CARDIAC CATHETERIZATION N/A 07/01/2015   Procedure: Left Heart Cath and Coronary Angiography;  Surgeon: Adrian Prows, MD;  Location: [facility];  Service: Cardiovascular;  Laterality: N/A;  . CARDIAC CATHETERIZATION N/A 07/01/2015   Procedure: Intravascular Pressure Wire/FFR Study;  Surgeon: Adrian Prows, MD;  Location: [facility];  Service: Cardiovascular;  Laterality: N/A;  . CARPAL TUNNEL RELEASE Bilateral   . CATARACT EXTRACTION W/ INTRAOCULAR LENS  IMPLANT, BILATERAL Bilateral   . CHOLECYSTECTOMY OPEN    . COLONOSCOPY N/A 12/10/2013   Procedure: COLONOSCOPY;  Surgeon: Inda Castle, MD;  Location: [facility];  Service: Endoscopy;  Laterality: N/A;  . FRACTURE SURGERY    . INCISION AND DRAINAGE ABSCESS Left  foot "under my little toe"  . KNEE LIGAMENT RECONSTRUCTION Right   . LEFT HEART CATHETERIZATION WITH CORONARY ANGIOGRAM N/A 08/08/2012   Procedure: LEFT HEART CATHETERIZATION WITH CORONARY ANGIOGRAM;  Surgeon: [name], MD;  Location: [facility] CATH LAB;  Service: Cardiovascular;  Laterality: N/A;  . NASAL SINUS SURGERY    . PERCUTANEOUS CORONARY STENT INTERVENTION (PCI-S) N/A 08/21/2012   Procedure: PERCUTANEOUS CORONARY STENT INTERVENTION (PCI-S);  Surgeon: [name], MD;  Location: [facility] CATH LAB;  Service: Cardiovascular;  Laterality: N/A;  . TUBAL LIGATION       OB History   No obstetric history on  file.     Family History  Problem Relation Age of Onset  . Heart disease Mother   . Diabetes Mother   . Clotting disorder Mother   . Pneumonia Father   . Rheum arthritis Father   . Diabetes Sister   . Diabetes Sister   . Asthma Brother   . Cancer Brother   . Kidney disease Brother   . Lupus Son   . Heart disease Son   . Heart disease Daughter   . Colon cancer Neg Hx     Social History   Tobacco Use  . Smoking status: Former Smoker    Packs/day: 3.00    Years: 40.00    Pack years: 120.00    Types: Cigarettes    Start date: 07/05/1960    Quit date: 07/05/1998    Years since quitting: 21.9  . Smokeless tobacco: Never Used  Vaping Use  . Vaping Use: Never used  Substance Use Topics  . Alcohol use: No    Alcohol/week: 0.0 standard drinks    Comment: havent drank in over 40 years  . Drug use: No    Home Medications Prior to Admission medications   Medication Sig Start Date End Date Taking? Authorizing Provider  Accu-Chek Softclix Lancets lancets Use to test blood sugars 1-2 times daily.Dx:e11.9 05/12/20   [name], [name], MD  acetaminophen (TYLENOL) 500 MG tablet Take 500 mg by mouth every 6 (six) hours as needed for moderate pain.    [provider]  allopurinol (ZYLOPRIM) 100 MG tablet Take 2 tablets (200 mg total) by mouth daily. 02/18/20   [name], [name], MD  Ascorbic Acid (VITAMIN C) 500 MG CAPS Take 1 capsule by mouth daily. 02/26/20   [name], [name], MD  atorvastatin (LIPITOR) 20 MG tablet TAKE 1 TABLET(20 MG) BY MOUTH DAILY 06/03/20   [name], [name], MD  B Complex Vitamins (VITAMIN B COMPLEX) TABS Take 1 tablet by mouth daily.    [provider]  Blood Glucose Monitoring Suppl (ACCU-CHEK GUIDE) w/Device KIT Use to test blood sugars 1-2 times daily. Dx:e11.9 05/14/20   [name], [name], MD  Cholecalciferol (VITAMIN D3) 5000 UNITS CAPS Take 5,000 Units by mouth daily.    [provider]  colchicine 0.6 MG tablet 1 tab daily for 7 days then  as needed for acute attacks. 03/14/20   [name], [name], MD  DULoxetine (CYMBALTA) 60 MG capsule Take 1 capsule (60 mg total) by mouth daily. 10/20/19   [name], [name], MD  ferrous sulfate 325 (65 FE) MG tablet Take 325 mg by mouth 2 (two) times daily with a meal.     [provider]  Fluocinolone Acetonide Scalp 0.01 % OIL Apply 1 application topically at bedtime as needed (itching scalp).  07/30/19   [provider]  gabapentin (NEURONTIN) 100 MG capsule TAKE 1 CAPSULE(100  MG) BY MOUTH TWICE DAILY 05/02/20   [name], [name], MD  glucose blood (ACCU-CHEK GUIDE) test strip Use to test blood sugars 1-2 times daily. Dx: e11.9 05/14/20   [name], [name], MD  guaiFENesin-dextromethorphan [facility] DM) 100-10 MG/5ML syrup Take 5 mLs by mouth every 4 (four) hours as needed for cough. 11/08/19   Shelly Coss, MD  Insulin NPH, Human,, Isophane, (HUMULIN N KWIKPEN) 100 UNIT/ML Kiwkpen ADMINISTER 30 UNITS UNDER THE SKIN EVERY MORNING 03/31/20   [name], [name], MD  Insulin Pen Needle (B-D ULTRAFINE III SHORT PEN) 31G X 8 MM MISC CHECK BLOOD SUGAR 3 TIMES A DAY 03/15/19   [name], [name], MD  isosorbide mononitrate (IMDUR) 30 MG 24 hr tablet TAKE 1 TABLET(30 MG) BY MOUTH DAILY 02/04/20   Patwardhan, Reynold Bowen, MD  levETIRAcetam (KEPPRA) 500 MG tablet Take 1 tablet (500 mg total) by mouth 2 (two) times daily. 07/30/19   Suzzanne Cloud, NP  linaclotide Rolan Lipa) 145 MCG CAPS capsule Take 1 capsule (145 mcg total) by mouth daily as needed (abdominal cramping). 12/07/19   Hongalgi, Lenis Dickinson, MD  memantine (NAMENDA) 10 MG tablet Take 10 mg by mouth daily.    [provider]  metoprolol succinate (TOPROL-XL) 50 MG 24 hr tablet TAKE 1 TABLET BY MOUTH EVERY DAY. TAKE WITH OR IMMEDIATELY FOLLOWING A MEAL 12/17/19   [name], [name], MD  montelukast (SINGULAIR) 10 MG tablet TAKE 1 TABLET(10 MG) BY MOUTH AT BEDTIME 03/05/20   [name], [name], MD  Omega-3 Fatty Acids (FISH OIL) 1000 MG CAPS Take 1,000 mg by  mouth daily.     [provider]  pantoprazole (PROTONIX) 40 MG tablet TAKE 1 TABLET(40 MG) BY MOUTH DAILY Patient taking differently: Take 40 mg by mouth daily.  08/28/19   [name], [name], MD  Polyethyl Glycol-Propyl Glycol (SYSTANE) 0.4-0.3 % GEL ophthalmic gel Place 1 application into both eyes every 4 (four) hours as needed (dry eyes).    [provider]  PROAIR HFA 108 (90 Base) MCG/ACT inhaler INHALE 2 PUFF BY MOUTH EVERY 4 HOURS AS NEEDED USE ONLY IF YOU ARE WHEEZING Patient taking differently: Inhale 2 puffs into the lungs every 4 (four) hours as needed for wheezing.  10/17/19   [name], [name], MD  Spacer/Aero-Holding Chambers (AEROCHAMBER PLUS) inhaler Use as instructed to use with inahaler. 07/17/19   [name], [name], MD  SYMBICORT 80-4.5 MCG/ACT inhaler INHALE 2 PUFFS INTO THE LUNGS TWICE DAILY 03/31/20   [name], [name], MD  torsemide (DEMADEX) 20 MG tablet Take 1 tablet (20 mg total) by mouth 2 (two) times daily. 01/11/20   [name], [name], MD    Allergies    Ace inhibitors, Isosorb dinitrate-hydralazine, Penicillins, Buspirone, Pregabalin, Ropinirole hydrochloride, and Amantadines  Review of Systems   Review of Systems  Unable to perform ROS: Dementia    Physical Exam Updated Vital Signs SpO2 100%   Physical Exam Vitals and nursing note reviewed.  Constitutional:      General: She is not in acute distress.    Appearance: She is well-developed. She is obese. She is not ill-appearing or diaphoretic.  HENT:     Head: Normocephalic and atraumatic.  Eyes:     Conjunctiva/sclera: Conjunctivae normal.  Cardiovascular:     Rate and Rhythm: Normal rate and regular rhythm.  Pulmonary:     Effort: Pulmonary effort is normal. No respiratory distress.     Breath sounds: No stridor.  Abdominal:  General: There is no distension.  Skin:    General: Skin is warm and dry.  Neurological:     Mental Status: She is alert.     Cranial Nerves: No cranial nerve  deficit.     Comments: Withdrawn, but answers specific questions briefly, generally appropriately.  She is oriented roughly to place, not to date.  She moves all extremities spontaneously, and follows commands.  Psychiatric:        Cognition and Memory: Cognition is impaired. Memory is impaired.     ED Results / Procedures / Treatments   Labs (all labs ordered are listed, but only abnormal results are displayed) Labs Reviewed  COMPREHENSIVE METABOLIC PANEL - Abnormal; Notable for the following components:      Result Value   Glucose, Bld 226 (*)    BUN 69 (*)    Creatinine, Ser 2.51 (*)    Calcium 11.2 (*)    GFR, Estimated 19 (*)    All other components within normal limits  CBC WITH DIFFERENTIAL/PLATELET - Abnormal; Notable for the following components:   WBC 13.0 (*)    Hemoglobin 10.7 (*)    HCT 31.5 (*)    MCV 75.9 (*)    MCH 25.8 (*)    RDW 15.6 (*)    Neutro Abs 9.2 (*)    All other components within normal limits  URINALYSIS, ROUTINE W REFLEX MICROSCOPIC - Abnormal; Notable for the following components:   APPearance HAZY (*)    Leukocytes,Ua MODERATE (*)    Bacteria, UA RARE (*)    All other components within normal limits  LACTIC ACID, PLASMA  ETHANOL    EKG EKG Interpretation  Date/Time:  Thursday June 05 2020 11:02:20 EST Ventricular Rate:  73 PR Interval:  242 QRS Duration: 86 QT Interval:  458 QTC Calculation: 504 R Axis:   -41 Text Interpretation: Sinus rhythm Left axis deviation Low voltage QRS Poor data quality Abnormal ECG Confirmed by Carmin Muskrat [phone]) on 06/05/2020 11:07:58 AM  On monitor the patient has sinus rhythm, rate 70s, unremarkable. Pulse oximetry 99% with 3 L via nasal cannula abnormal  Radiology CT HEAD WO CONTRAST  Result Date: 06/05/2020 CLINICAL DATA:  Altered mental status. EXAM: CT HEAD WITHOUT CONTRAST TECHNIQUE: Contiguous axial images were obtained from the base of the skull through the vertex without intravenous  contrast. COMPARISON:  CT head dated 11/01/2019. FINDINGS: Brain: No evidence of acute infarction, hemorrhage, hydrocephalus, extra-axial collection or mass lesion/mass effect. Unchanged encephalomalacia of the right frontal lobe. There is mild cerebral volume loss with associated ex vacuo dilatation. Periventricular white matter hypoattenuation likely represents chronic small vessel ischemic disease. Vascular: There are vascular calcifications in the carotid siphons. Skull: Normal. Negative for fracture or focal lesion. Sinuses/Orbits: Postsurgical changes and chronic reactive changes are noted in the maxillary sinus. Other: None. IMPRESSION: No acute intracranial process. Electronically Signed   By: Zerita Boers M.D.   On: 06/05/2020 11:55   DG Chest Port 1 View  Result Date: 06/05/2020 CLINICAL DATA:  Altered mental status EXAM: PORTABLE CHEST 1 VIEW COMPARISON:  Chest radiograph December 05, 2019 and chest CT January 13, 2017 FINDINGS: Stable cardiomegaly. Atherosclerotic calcifications of the aorta. Pulmonary vascular congestion with prominent bilateral interstitial markings. Small bilateral pleural effusions. No pneumothorax. Osseous structures are unchanged. IMPRESSION: Cardiomegaly with mild pulmonary edema and small bilateral pleural effusions. Electronically Signed   By: Dahlia Bailiff MD   On: 06/05/2020 11:21    Procedures Procedures (including critical care time)  Medications Ordered in ED Medications  sodium chloride 0.9 % bolus 1,000 mL (has no administration in time range)    And  0.9 %  sodium chloride infusion (has no administration in time range)    ED Course  I have reviewed the triage vital signs and the nursing notes.  Pertinent labs & imaging results that were available during my care of the patient were reviewed by me and considered in my medical decision making (see chart for details).   Update:, Patient accompanied by her daughter. Daughter notes worsening dementia in  general, and today's increased confusion. We reviewed initial findings which are generally reassuring, chemistry panel notable for slight elevation in creatinine from several months ago, though not criteria for AKI. This may be contributed to the patient's mental status changes.  She has begun empiric fluids.   Update:, Remaining labs reassuring, CT scan, x-ray reassuring.  Patient sitting upright doing a word search puzzle.  4:49 PM Patient is completed IV fluid resuscitation, has had no new complaints. MDM Rules/Calculators/A&P MDM Number of Diagnoses or Management Options Confusion: new, needed workup Dehydration: new, needed workup   Amount and/or Complexity of Data Reviewed Clinical lab tests: reviewed Tests in the radiology section of CPT: reviewed Tests in the medicine section of CPT: reviewed Decide to obtain previous medical records or to obtain history from someone other than the patient: yes Obtain history from someone other than the patient: yes Review and summarize past medical records: yes Independent visualization of images, tracings, or specimens: yes  Risk of Complications, Morbidity, and/or Mortality Presenting problems: high Diagnostic procedures: high Management options: high  Critical Care Total time providing critical care: < 30 minutes  Patient Progress Patient progress: stable  Final Clinical Impression(s) / ED Diagnoses Final diagnoses:  Dehydration  Confusion    Rx / DC Orders ED Discharge Orders         Ordered    cephALEXin (KEFLEX) 250 MG capsule  2 times daily        06/05/20 1651           Carmin Muskrat, MD 06/05/20 1652

## 2020-06-05 NOTE — ED Notes (Signed)
Pt transported to CT ?

## 2020-06-05 NOTE — Discharge Instructions (Addendum)
As discussed, today's evaluation has been somewhat reassuring, but there are some evidence for dehydration and possible urinary tract infection contributing to your mother's confusion. Please be sure to follow-up with her physician in the coming days, and do not hesitate to return here for concerning changes.

## 2020-06-05 NOTE — ED Notes (Signed)
Pt walked with walker to restroom and provided UA sample. UA sent to lab.

## 2020-06-05 NOTE — ED Triage Notes (Signed)
Pt BIBA from home.   Per EMS-  Home health RN reports pt "out of her head confused" bc pt was having difficulty to get out of her recliner (trouble using remote?).  Pt has hx of dementia.   Pt able to answer orientation questions appropriately, only c/o dark stool x3 months. Pt has appt with GI next week, 12/9. Denies abd pain.   Pt arrives wearing 3L O2, which is her baseline.

## 2020-06-07 ENCOUNTER — Encounter (HOSPITAL_COMMUNITY): Payer: Self-pay

## 2020-06-07 ENCOUNTER — Inpatient Hospital Stay (HOSPITAL_COMMUNITY): Payer: Medicare Other

## 2020-06-07 ENCOUNTER — Emergency Department (HOSPITAL_COMMUNITY): Payer: Medicare Other

## 2020-06-07 ENCOUNTER — Inpatient Hospital Stay (HOSPITAL_COMMUNITY)
Admission: EM | Admit: 2020-06-07 | Discharge: 2020-06-17 | DRG: 682 | Disposition: A | Discharge status: Skilled Nursing Facility | Payer: Medicare Other | Attending: Internal Medicine | Admitting: Internal Medicine

## 2020-06-07 ENCOUNTER — Other Ambulatory Visit: Payer: Self-pay

## 2020-06-07 DIAGNOSIS — R4182 Altered mental status, unspecified: Secondary | ICD-10-CM | POA: Diagnosis present

## 2020-06-07 DIAGNOSIS — G2581 Restless legs syndrome: Secondary | ICD-10-CM | POA: Diagnosis present

## 2020-06-07 DIAGNOSIS — I6389 Other cerebral infarction: Secondary | ICD-10-CM | POA: Diagnosis not present

## 2020-06-07 DIAGNOSIS — R55 Syncope and collapse: Secondary | ICD-10-CM | POA: Diagnosis not present

## 2020-06-07 DIAGNOSIS — E119 Type 2 diabetes mellitus without complications: Secondary | ICD-10-CM | POA: Diagnosis not present

## 2020-06-07 DIAGNOSIS — N1832 Chronic kidney disease, stage 3b: Secondary | ICD-10-CM | POA: Diagnosis not present

## 2020-06-07 DIAGNOSIS — E118 Type 2 diabetes mellitus with unspecified complications: Secondary | ICD-10-CM

## 2020-06-07 DIAGNOSIS — D573 Sickle-cell trait: Secondary | ICD-10-CM | POA: Diagnosis present

## 2020-06-07 DIAGNOSIS — Z841 Family history of disorders of kidney and ureter: Secondary | ICD-10-CM

## 2020-06-07 DIAGNOSIS — Z9981 Dependence on supplemental oxygen: Secondary | ICD-10-CM | POA: Diagnosis not present

## 2020-06-07 DIAGNOSIS — I4891 Unspecified atrial fibrillation: Secondary | ICD-10-CM | POA: Diagnosis not present

## 2020-06-07 DIAGNOSIS — Z794 Long term (current) use of insulin: Secondary | ICD-10-CM | POA: Diagnosis not present

## 2020-06-07 DIAGNOSIS — R5381 Other malaise: Secondary | ICD-10-CM | POA: Diagnosis not present

## 2020-06-07 DIAGNOSIS — I480 Paroxysmal atrial fibrillation: Secondary | ICD-10-CM | POA: Diagnosis present

## 2020-06-07 DIAGNOSIS — Z8673 Personal history of transient ischemic attack (TIA), and cerebral infarction without residual deficits: Secondary | ICD-10-CM

## 2020-06-07 DIAGNOSIS — Z825 Family history of asthma and other chronic lower respiratory diseases: Secondary | ICD-10-CM

## 2020-06-07 DIAGNOSIS — I499 Cardiac arrhythmia, unspecified: Secondary | ICD-10-CM | POA: Diagnosis not present

## 2020-06-07 DIAGNOSIS — Z6841 Body Mass Index (BMI) 40.0 and over, adult: Secondary | ICD-10-CM | POA: Diagnosis not present

## 2020-06-07 DIAGNOSIS — M797 Fibromyalgia: Secondary | ICD-10-CM | POA: Diagnosis not present

## 2020-06-07 DIAGNOSIS — N390 Urinary tract infection, site not specified: Secondary | ICD-10-CM | POA: Diagnosis present

## 2020-06-07 DIAGNOSIS — K219 Gastro-esophageal reflux disease without esophagitis: Secondary | ICD-10-CM | POA: Diagnosis present

## 2020-06-07 DIAGNOSIS — G9341 Metabolic encephalopathy: Secondary | ICD-10-CM | POA: Diagnosis not present

## 2020-06-07 DIAGNOSIS — R627 Adult failure to thrive: Secondary | ICD-10-CM | POA: Diagnosis present

## 2020-06-07 DIAGNOSIS — I429 Cardiomyopathy, unspecified: Secondary | ICD-10-CM | POA: Diagnosis present

## 2020-06-07 DIAGNOSIS — I517 Cardiomegaly: Secondary | ICD-10-CM | POA: Diagnosis not present

## 2020-06-07 DIAGNOSIS — Z888 Allergy status to other drugs, medicaments and biological substances status: Secondary | ICD-10-CM

## 2020-06-07 DIAGNOSIS — Z832 Family history of diseases of the blood and blood-forming organs and certain disorders involving the immune mechanism: Secondary | ICD-10-CM

## 2020-06-07 DIAGNOSIS — I130 Hypertensive heart and chronic kidney disease with heart failure and stage 1 through stage 4 chronic kidney disease, or unspecified chronic kidney disease: Secondary | ICD-10-CM | POA: Diagnosis present

## 2020-06-07 DIAGNOSIS — E785 Hyperlipidemia, unspecified: Secondary | ICD-10-CM | POA: Diagnosis present

## 2020-06-07 DIAGNOSIS — F0390 Unspecified dementia without behavioral disturbance: Secondary | ICD-10-CM | POA: Diagnosis present

## 2020-06-07 DIAGNOSIS — J9611 Chronic respiratory failure with hypoxia: Secondary | ICD-10-CM | POA: Diagnosis not present

## 2020-06-07 DIAGNOSIS — R488 Other symbolic dysfunctions: Secondary | ICD-10-CM | POA: Diagnosis not present

## 2020-06-07 DIAGNOSIS — Z66 Do not resuscitate: Secondary | ICD-10-CM | POA: Diagnosis present

## 2020-06-07 DIAGNOSIS — M2550 Pain in unspecified joint: Secondary | ICD-10-CM | POA: Diagnosis not present

## 2020-06-07 DIAGNOSIS — E049 Nontoxic goiter, unspecified: Secondary | ICD-10-CM | POA: Diagnosis not present

## 2020-06-07 DIAGNOSIS — Z7401 Bed confinement status: Secondary | ICD-10-CM | POA: Diagnosis not present

## 2020-06-07 DIAGNOSIS — R404 Transient alteration of awareness: Secondary | ICD-10-CM | POA: Diagnosis not present

## 2020-06-07 DIAGNOSIS — E039 Hypothyroidism, unspecified: Secondary | ICD-10-CM | POA: Diagnosis present

## 2020-06-07 DIAGNOSIS — E1165 Type 2 diabetes mellitus with hyperglycemia: Secondary | ICD-10-CM | POA: Diagnosis present

## 2020-06-07 DIAGNOSIS — N179 Acute kidney failure, unspecified: Principal | ICD-10-CM | POA: Diagnosis present

## 2020-06-07 DIAGNOSIS — M810 Age-related osteoporosis without current pathological fracture: Secondary | ICD-10-CM | POA: Diagnosis present

## 2020-06-07 DIAGNOSIS — Z8261 Family history of arthritis: Secondary | ICD-10-CM

## 2020-06-07 DIAGNOSIS — J811 Chronic pulmonary edema: Secondary | ICD-10-CM | POA: Diagnosis not present

## 2020-06-07 DIAGNOSIS — Z20822 Contact with and (suspected) exposure to covid-19: Secondary | ICD-10-CM | POA: Diagnosis present

## 2020-06-07 DIAGNOSIS — Z8616 Personal history of COVID-19: Secondary | ICD-10-CM

## 2020-06-07 DIAGNOSIS — Z8249 Family history of ischemic heart disease and other diseases of the circulatory system: Secondary | ICD-10-CM

## 2020-06-07 DIAGNOSIS — M109 Gout, unspecified: Secondary | ICD-10-CM | POA: Diagnosis present

## 2020-06-07 DIAGNOSIS — Z79899 Other long term (current) drug therapy: Secondary | ICD-10-CM

## 2020-06-07 DIAGNOSIS — M6281 Muscle weakness (generalized): Secondary | ICD-10-CM | POA: Diagnosis not present

## 2020-06-07 DIAGNOSIS — R569 Unspecified convulsions: Secondary | ICD-10-CM | POA: Diagnosis not present

## 2020-06-07 DIAGNOSIS — Z809 Family history of malignant neoplasm, unspecified: Secondary | ICD-10-CM

## 2020-06-07 DIAGNOSIS — G40909 Epilepsy, unspecified, not intractable, without status epilepticus: Secondary | ICD-10-CM | POA: Diagnosis present

## 2020-06-07 DIAGNOSIS — G4733 Obstructive sleep apnea (adult) (pediatric): Secondary | ICD-10-CM | POA: Diagnosis present

## 2020-06-07 DIAGNOSIS — F319 Bipolar disorder, unspecified: Secondary | ICD-10-CM | POA: Diagnosis present

## 2020-06-07 DIAGNOSIS — I10 Essential (primary) hypertension: Secondary | ICD-10-CM | POA: Diagnosis not present

## 2020-06-07 DIAGNOSIS — E860 Dehydration: Secondary | ICD-10-CM | POA: Diagnosis present

## 2020-06-07 DIAGNOSIS — E669 Obesity, unspecified: Secondary | ICD-10-CM | POA: Diagnosis present

## 2020-06-07 DIAGNOSIS — J45909 Unspecified asthma, uncomplicated: Secondary | ICD-10-CM | POA: Diagnosis not present

## 2020-06-07 DIAGNOSIS — M25869 Other specified joint disorders, unspecified knee: Secondary | ICD-10-CM | POA: Diagnosis not present

## 2020-06-07 DIAGNOSIS — J9601 Acute respiratory failure with hypoxia: Secondary | ICD-10-CM | POA: Diagnosis not present

## 2020-06-07 DIAGNOSIS — N183 Chronic kidney disease, stage 3 unspecified: Secondary | ICD-10-CM | POA: Diagnosis not present

## 2020-06-07 DIAGNOSIS — Z7951 Long term (current) use of inhaled steroids: Secondary | ICD-10-CM

## 2020-06-07 DIAGNOSIS — J449 Chronic obstructive pulmonary disease, unspecified: Secondary | ICD-10-CM | POA: Diagnosis not present

## 2020-06-07 DIAGNOSIS — R2689 Other abnormalities of gait and mobility: Secondary | ICD-10-CM | POA: Diagnosis not present

## 2020-06-07 DIAGNOSIS — E1122 Type 2 diabetes mellitus with diabetic chronic kidney disease: Secondary | ICD-10-CM | POA: Diagnosis present

## 2020-06-07 DIAGNOSIS — Z9889 Other specified postprocedural states: Secondary | ICD-10-CM | POA: Diagnosis not present

## 2020-06-07 DIAGNOSIS — Z833 Family history of diabetes mellitus: Secondary | ICD-10-CM

## 2020-06-07 DIAGNOSIS — R6889 Other general symptoms and signs: Secondary | ICD-10-CM | POA: Diagnosis not present

## 2020-06-07 DIAGNOSIS — Z880 Allergy status to penicillin: Secondary | ICD-10-CM

## 2020-06-07 DIAGNOSIS — I2510 Atherosclerotic heart disease of native coronary artery without angina pectoris: Secondary | ICD-10-CM | POA: Diagnosis present

## 2020-06-07 DIAGNOSIS — M189 Osteoarthritis of first carpometacarpal joint, unspecified: Secondary | ICD-10-CM | POA: Diagnosis not present

## 2020-06-07 DIAGNOSIS — I5032 Chronic diastolic (congestive) heart failure: Secondary | ICD-10-CM | POA: Diagnosis not present

## 2020-06-07 DIAGNOSIS — R296 Repeated falls: Secondary | ICD-10-CM | POA: Diagnosis not present

## 2020-06-07 DIAGNOSIS — Z87891 Personal history of nicotine dependence: Secondary | ICD-10-CM

## 2020-06-07 DIAGNOSIS — J90 Pleural effusion, not elsewhere classified: Secondary | ICD-10-CM | POA: Diagnosis not present

## 2020-06-07 DIAGNOSIS — E1140 Type 2 diabetes mellitus with diabetic neuropathy, unspecified: Secondary | ICD-10-CM | POA: Diagnosis present

## 2020-06-07 DIAGNOSIS — M329 Systemic lupus erythematosus, unspecified: Secondary | ICD-10-CM | POA: Diagnosis present

## 2020-06-07 DIAGNOSIS — R1312 Dysphagia, oropharyngeal phase: Secondary | ICD-10-CM | POA: Diagnosis not present

## 2020-06-07 DIAGNOSIS — Z743 Need for continuous supervision: Secondary | ICD-10-CM | POA: Diagnosis not present

## 2020-06-07 DIAGNOSIS — N281 Cyst of kidney, acquired: Secondary | ICD-10-CM | POA: Diagnosis not present

## 2020-06-07 DIAGNOSIS — J320 Chronic maxillary sinusitis: Secondary | ICD-10-CM | POA: Diagnosis not present

## 2020-06-07 DIAGNOSIS — G9389 Other specified disorders of brain: Secondary | ICD-10-CM | POA: Diagnosis not present

## 2020-06-07 DIAGNOSIS — I700 Atherosclerosis of aorta: Secondary | ICD-10-CM | POA: Diagnosis present

## 2020-06-07 DIAGNOSIS — G4730 Sleep apnea, unspecified: Secondary | ICD-10-CM | POA: Diagnosis not present

## 2020-06-07 DIAGNOSIS — F419 Anxiety disorder, unspecified: Secondary | ICD-10-CM | POA: Diagnosis present

## 2020-06-07 DIAGNOSIS — R278 Other lack of coordination: Secondary | ICD-10-CM | POA: Diagnosis not present

## 2020-06-07 LAB — CBC WITH DIFFERENTIAL/PLATELET
Abs Immature Granulocytes: 0.09 10*3/uL — ABNORMAL HIGH (ref 0.00–0.07)
Basophils Absolute: 0.1 10*3/uL (ref 0.0–0.1)
Basophils Relative: 0 %
Eosinophils Absolute: 0.1 10*3/uL (ref 0.0–0.5)
Eosinophils Relative: 1 %
HCT: 34.9 % — ABNORMAL LOW (ref 36.0–46.0)
Hemoglobin: 11.7 g/dL — ABNORMAL LOW (ref 12.0–15.0)
Immature Granulocytes: 1 %
Lymphocytes Relative: 11 %
Lymphs Abs: 1.6 10*3/uL (ref 0.7–4.0)
MCH: 25.4 pg — ABNORMAL LOW (ref 26.0–34.0)
MCHC: 33.5 g/dL (ref 30.0–36.0)
MCV: 75.7 fL — ABNORMAL LOW (ref 80.0–100.0)
Monocytes Absolute: 1.1 10*3/uL — ABNORMAL HIGH (ref 0.1–1.0)
Monocytes Relative: 8 %
Neutro Abs: 11 10*3/uL — ABNORMAL HIGH (ref 1.7–7.7)
Neutrophils Relative %: 79 %
Platelets: 179 10*3/uL (ref 150–400)
RBC: 4.61 MIL/uL (ref 3.87–5.11)
RDW: 15.5 % (ref 11.5–15.5)
WBC: 14 10*3/uL — ABNORMAL HIGH (ref 4.0–10.5)
nRBC: 0.1 % (ref 0.0–0.2)

## 2020-06-07 LAB — COMPREHENSIVE METABOLIC PANEL
ALT: 21 U/L (ref 0–44)
AST: 38 U/L (ref 15–41)
Albumin: 3.4 g/dL — ABNORMAL LOW (ref 3.5–5.0)
Alkaline Phosphatase: 97 U/L (ref 38–126)
Anion gap: 13 (ref 5–15)
BUN: 63 mg/dL — ABNORMAL HIGH (ref 8–23)
CO2: 25 mmol/L (ref 22–32)
Calcium: 12 mg/dL — ABNORMAL HIGH (ref 8.9–10.3)
Chloride: 101 mmol/L (ref 98–111)
Creatinine, Ser: 3.17 mg/dL — ABNORMAL HIGH (ref 0.44–1.00)
GFR, Estimated: 14 mL/min — ABNORMAL LOW (ref >60)
Glucose, Bld: 252 mg/dL — ABNORMAL HIGH (ref 70–99)
Potassium: 3.9 mmol/L (ref 3.5–5.1)
Sodium: 139 mmol/L (ref 135–145)
Total Bilirubin: 0.8 mg/dL (ref 0.3–1.2)
Total Protein: 7.5 g/dL (ref 6.5–8.1)

## 2020-06-07 LAB — AMMONIA: Ammonia: <9 umol/L — ABNORMAL LOW (ref 9–35)

## 2020-06-07 LAB — GLUCOSE, CAPILLARY: Glucose-Capillary: 188 mg/dL — ABNORMAL HIGH (ref 70–99)

## 2020-06-07 LAB — RESP PANEL BY RT-PCR (FLU A&B, COVID) ARPGX2
Influenza A by PCR: NEGATIVE
Influenza B by PCR: NEGATIVE
SARS Coronavirus 2 by RT PCR: NEGATIVE

## 2020-06-07 LAB — TSH: TSH: 2.934 u[IU]/mL (ref 0.350–4.500)

## 2020-06-07 LAB — BRAIN NATRIURETIC PEPTIDE: B Natriuretic Peptide: 160.9 pg/mL — ABNORMAL HIGH (ref 0.0–100.0)

## 2020-06-07 LAB — CK: Total CK: 317 U/L — ABNORMAL HIGH (ref 38–234)

## 2020-06-07 MED ORDER — METOPROLOL SUCCINATE ER 25 MG PO TB24: 12.5000 mg | ORAL_TABLET | Freq: Every day | ORAL | Status: DC

## 2020-06-07 MED ORDER — LACTATED RINGERS IV SOLN: INTRAVENOUS | Status: AC

## 2020-06-07 MED ORDER — VITAMIN D 25 MCG (1000 UNIT) PO TABS
5000.0000 [IU] | ORAL_TABLET | Freq: Every day | ORAL | Status: DC
  Administered 2020-06-08: 5000 [IU] via ORAL
  Filled 2020-06-07: qty 5

## 2020-06-07 MED ORDER — MOMETASONE FURO-FORMOTEROL FUM 100-5 MCG/ACT IN AERO
2.0000 | INHALATION_SPRAY | Freq: Two times a day (BID) | RESPIRATORY_TRACT | Status: DC
  Administered 2020-06-07 – 2020-06-17 (×14): 2 via RESPIRATORY_TRACT
  Filled 2020-06-07: qty 8.8

## 2020-06-07 MED ORDER — ALLOPURINOL 100 MG PO TABS
200.0000 mg | ORAL_TABLET | Freq: Every day | ORAL | Status: DC
  Administered 2020-06-07 – 2020-06-17 (×11): 200 mg via ORAL
  Filled 2020-06-07 (×11): qty 2

## 2020-06-07 MED ORDER — ATORVASTATIN CALCIUM 10 MG PO TABS
20.0000 mg | ORAL_TABLET | Freq: Every day | ORAL | Status: DC
  Administered 2020-06-07 – 2020-06-17 (×11): 20 mg via ORAL
  Filled 2020-06-07 (×13): qty 2

## 2020-06-07 MED ORDER — LEVETIRACETAM 500 MG PO TABS: 500.0000 mg | ORAL_TABLET | Freq: Two times a day (BID) | ORAL | Status: DC

## 2020-06-07 MED ORDER — DULOXETINE HCL 60 MG PO CPEP
60.0000 mg | ORAL_CAPSULE | Freq: Every day | ORAL | Status: DC
  Administered 2020-06-07 – 2020-06-17 (×11): 60 mg via ORAL
  Filled 2020-06-07 (×11): qty 1

## 2020-06-07 MED ORDER — VITAMIN B COMPLEX PO TABS: 1.0000 | ORAL_TABLET | Freq: Every day | ORAL | Status: DC

## 2020-06-07 MED ORDER — FOSFOMYCIN TROMETHAMINE 3 G PO PACK: 3.0000 g | PACK | Freq: Once | ORAL | Status: DC

## 2020-06-07 MED ORDER — LEVETIRACETAM IN NACL 500 MG/100ML IV SOLN
500.0000 mg | Freq: Two times a day (BID) | INTRAVENOUS | Status: DC
  Administered 2020-06-08 (×3): 500 mg via INTRAVENOUS
  Filled 2020-06-07 (×4): qty 100

## 2020-06-07 MED ORDER — INSULIN ASPART 100 UNIT/ML ~~LOC~~ SOLN
0.0000 [IU] | Freq: Three times a day (TID) | SUBCUTANEOUS | Status: DC
  Administered 2020-06-08 (×2): 3 [IU] via SUBCUTANEOUS
  Administered 2020-06-08 – 2020-06-09 (×3): 2 [IU] via SUBCUTANEOUS
  Administered 2020-06-09: 3 [IU] via SUBCUTANEOUS
  Administered 2020-06-10: 2 [IU] via SUBCUTANEOUS
  Administered 2020-06-10: 1 [IU] via SUBCUTANEOUS
  Administered 2020-06-10: 3 [IU] via SUBCUTANEOUS
  Administered 2020-06-11 (×2): 2 [IU] via SUBCUTANEOUS
  Administered 2020-06-11: 1 [IU] via SUBCUTANEOUS
  Administered 2020-06-12: 2 [IU] via SUBCUTANEOUS
  Administered 2020-06-12 – 2020-06-13 (×3): 1 [IU] via SUBCUTANEOUS
  Administered 2020-06-14 – 2020-06-15 (×4): 2 [IU] via SUBCUTANEOUS
  Administered 2020-06-15 – 2020-06-16 (×3): 1 [IU] via SUBCUTANEOUS

## 2020-06-07 MED ORDER — GABAPENTIN 100 MG PO CAPS
100.0000 mg | ORAL_CAPSULE | Freq: Two times a day (BID) | ORAL | Status: DC
  Administered 2020-06-07 – 2020-06-08 (×2): 100 mg via ORAL
  Filled 2020-06-07 (×2): qty 1

## 2020-06-07 MED ORDER — SODIUM CHLORIDE 0.9 % IV BOLUS
500.0000 mL | Freq: Once | INTRAVENOUS | Status: AC
  Administered 2020-06-07: 500 mL via INTRAVENOUS

## 2020-06-07 MED ORDER — HEPARIN SODIUM (PORCINE) 5000 UNIT/ML IJ SOLN
5000.0000 [IU] | Freq: Three times a day (TID) | INTRAMUSCULAR | Status: DC
  Administered 2020-06-08 – 2020-06-09 (×4): 5000 [IU] via SUBCUTANEOUS
  Filled 2020-06-07 (×4): qty 1

## 2020-06-07 MED ORDER — SODIUM CHLORIDE 0.9 % IV SOLN
1.0000 g | INTRAVENOUS | Status: AC
  Administered 2020-06-08 – 2020-06-10 (×3): 1 g via INTRAVENOUS
  Filled 2020-06-07 (×2): qty 10
  Filled 2020-06-07: qty 1

## 2020-06-07 MED ORDER — FERROUS SULFATE 325 (65 FE) MG PO TABS
325.0000 mg | ORAL_TABLET | Freq: Two times a day (BID) | ORAL | Status: DC
  Administered 2020-06-08 – 2020-06-17 (×18): 325 mg via ORAL
  Filled 2020-06-07 (×18): qty 1

## 2020-06-07 MED ORDER — PANTOPRAZOLE SODIUM 40 MG PO TBEC
40.0000 mg | DELAYED_RELEASE_TABLET | Freq: Every day | ORAL | Status: DC
  Administered 2020-06-07 – 2020-06-17 (×11): 40 mg via ORAL
  Filled 2020-06-07 (×11): qty 1

## 2020-06-07 MED ORDER — INSULIN ASPART 100 UNIT/ML ~~LOC~~ SOLN
0.0000 [IU] | Freq: Every day | SUBCUTANEOUS | Status: DC
  Administered 2020-06-08: 2 [IU] via SUBCUTANEOUS

## 2020-06-07 NOTE — ED Notes (Signed)
CT called requesting update regarding pending CT head/C-spine. Per CT tech  Pt next for scan

## 2020-06-07 NOTE — ED Provider Notes (Signed)
[facility] EMERGENCY DEPARTMENT Provider Note   CSN: [id number] Arrival date & time: 06/07/20  1011     History No chief complaint on file.   Brittany Roberts is a 79 y.o. female.  HPI 78 year old female with a history of CHF, bipolar disorder, asthma, GERD, lupus, hypertension, hypothyroidism, seizures, DM type II, chronically on 3 L nasal cannula presents to the ER after a fall.  Patient's daughter at bedside who provided most of the history.  Level 5 caveat secondary to patient's dementia.  Daughter states that she was seen here at [facility] on 12/2 with similar complaint, per chart review and daughters report the patient has been having worsening mental status over the last few days beyond baseline at baseline, the daughter states that the patient can bathe herself, take care of basic needs, ambulate without much supervision or assistance.  She states that over the last few days she has become increasingly agitated, confused, lethargic, sleeping more, not eating or drinking, not taking her medicines.  On her visit on 12/2, she had a mildly elevated creatinine but not an AKI, as well as a UTI.  Patient was sent home on 250 mg of Keflex, daughter states she started the medication yesterday.  Daughter states that she has been compliant with the medicines, however her mental status continued to worsen. Pt had COVID in March and had a  Today she was found down on the bathroom floor for an unknown amount of time.  She is not on anticoagulation.  Daughter states that she has been trying to work on getting the patient placed over the last number of months but many places have wait times.  Patient is alert and oriented x2, to person and time but not place.No increasing O2 requirements, daughter denies cough, fevers, respiratory distress    Past Medical History:  Diagnosis Date  . Acute delirium 01/14/2017  . Anxiety   . Arthritis    "all over"  . Asthma   . Bipolar disorder ([place])    . Complication of anesthesia    "w/right foot OR they gave me too much and they couldn't get me woke"  . Congestive heart failure, unspecified   . Coronary artery calcification seen on CAT scan 01/14/2017  . Coronary artery disease    "I've got 1 stent" (06/30/2015)  . Depression   . Discoid lupus   . Fibromyalgia    "I've been told I have this; can't take Lyrica cause I'm allergic to it" (06/30/2015)  . GERD (gastroesophageal reflux disease)   . History of gout   . History of hiatal hernia    "real bad" (06/30/2015)  . Hypertension   . Hypothyroidism   . Memory loss   . On home oxygen therapy    "2L q hs" (06/30/2015)  . OSA (obstructive sleep apnea)    "just use my oxygen; no mask" (06/30/2015)  . Other and unspecified hyperlipidemia   . Penetrating foot wound    left nonhealing foot wound on the dorsal surface  . Peripheral neuropathy   . Pneumonia "many of times"  . Refusal of blood transfusions as patient is Jehovah's Witness   . Renal failure, unspecified    "kidneys not working 100%" (06/30/2015)  . Seizure ([place])   . Sickle cell trait ([place])   . Systemic lupus ([place])   . Thoracic aortic atherosclerosis ([place]) 01/14/2017  . Type II diabetes mellitus ([place])   . Unspecified vitamin D deficiency  Patient Active Problem List   Diagnosis Date Noted  . Hypomagnesemia 02/20/2020  . Chronic respiratory failure with hypoxia, on home O2 therapy ([place]) 02/20/2020  . Monitoring for long-term anticoagulant use 12/17/2019  . LV (left ventricular) mural thrombus 12/11/2019  . Acute on chronic diastolic CHF (congestive heart failure) ([place]) 12/06/2019  . Pneumonia due to COVID-19 virus 11/01/2019  . Respiratory failure ([place]) 11/01/2019  . Memory loss 04/05/2019  . Cerebrovascular accident (CVA) ([place]) 04/05/2019  . Seizures ([place]) 04/05/2019  . Obstructive sleep apnea 04/05/2019  . Chronic heart failure with preserved ejection fraction ([place]) 01/17/2019  . OP (osteoporosis)  01/01/2019  . Abdominal wall pain in right flank 08/13/2018  . Alopecia, scarring 08/11/2018  . Chronic right shoulder pain 03/15/2018  . Bilateral primary osteoarthritis of knee 02/09/2018  . Pain in right ankle and joints of right foot 02/09/2018  . Acute exacerbation of congestive heart failure ([place]) 12/13/2017  . Chronic kidney disease (CKD), stage IV (severe) ([place]) 09/01/2017  . Gout, arthropathy 09/01/2017  . Impacted cerumen of both ears 05/13/2017  . Sensorineural hearing loss, bilateral 05/13/2017  . Localization-related symptomatic epilepsy and epileptic syndromes with complex partial seizures, not intractable, without status epilepticus ([place]) 04/25/2017  . Fibromyalgia 04/08/2017  . Allergic rhinitis 03/11/2017  . Cardiomyopathy ([place]) 01/22/2017  . Hyperlipidemia associated with type 2 diabetes mellitus ([place]) 01/22/2017  . Bipolar disorder ([place]) 01/22/2017  . Seizure ([place])   . Thoracic aortic atherosclerosis ([place]) 01/14/2017  . Coronary artery calcification seen on CAT scan 01/14/2017  . Acute delirium 01/14/2017  . Possible Tonic-clonic seizure disorder ([place]) 01/14/2017  . CVA (cerebral vascular accident) ([place]) 01/14/2017  . Altered mental status 01/13/2017  . Mastalgia 04/20/2016  . Bilateral leg edema 02/10/2016  . Difficulty hearing 10/30/2015  . Midline low back pain with right-sided sciatica 09/25/2015  . Carpal tunnel syndrome on left 08/14/2015  . Chest pain of uncertain etiology 78/29/5621  . Primary localized osteoarthrosis, hand 06/19/2015  . Radial styloid tenosynovitis 06/19/2015  . Dysarthria 02/14/2015  . Type 2 diabetes mellitus with complication ([place])   . Cephalalgia   . Hx of systemic lupus erythematosus (SLE) ([place])   . Thyroid mass 04/26/2014  . Constipation 12/10/2013  . Benign neoplasm of colon 12/10/2013  . Type 2 diabetes mellitus with diabetic neuropathy, unspecified ([place]) 11/27/2013  . Renal cyst, right 11/09/2013  . Insulin dependent type  2 diabetes mellitus ([place]) 10/29/2013  . Hoarseness or changing voice 09/27/2013  . Dyskinesia, tardive 07/11/2013  . TMJ syndrome 02/16/2013  . Angina pectoris associated with type 2 diabetes mellitus ([place]) 08/08/2012  . Coronary artery disease of native artery of native heart with stable angina pectoris ([place]) 08/08/2012  . Asthma, chronic, PRESUMED wiht MULTIFACTORIAL DYSPNEA 05/30/2012  . Insomnia 09/22/2011  . Mobility poor 02/02/2011  . Chronic kidney disease, stage III (moderate) ([place]) 05/13/2010  . Chronic systolic (congestive) heart failure ([place]) 03/31/2010  . COPD (chronic obstructive pulmonary disease) ([place]) 03/26/2010  . Morbid obesity ([place]) 02/27/2010  . Unstable gait 02/27/2010  . DM (diabetes mellitus), type 2 with neurological complications ([place]) 30/86/5784  . Arthritis 04/25/2009  . Anemia 03/11/2007  . COGNITIVE IMPAIRMENT, MILD, SO STATED 03/11/2007  . RESTLESS LEG SYNDROME 03/11/2007  . Lupus ([place]) 03/11/2007  . OSTEOARTHROSIS, GENERALIZED, MULTIPLE SITES 03/11/2007  . Hypothyroidism 03/07/2007  . Hypercholesteremia 03/07/2007  . Depression 03/07/2007  . Hypertensive heart disease with heart failure ([place]) 03/07/2007  . Coronary atherosclerosis 03/07/2007  . GERD 03/07/2007  . SLEEP APNEA  03/07/2007    Past Surgical History:  Procedure Laterality Date  . ANKLE ARTHROSCOPY WITH OPEN REDUCTION INTERNAL FIXATION (ORIF) Right 03/2011  . CARDIAC CATHETERIZATION N/A 07/01/2015   Procedure: Left Heart Cath and Coronary Angiography;  Surgeon: Adrian Prows, MD;  Location: [facility] CV LAB;  Service: Cardiovascular;  Laterality: N/A;  . CARDIAC CATHETERIZATION N/A 07/01/2015   Procedure: Intravascular Pressure Wire/FFR Study;  Surgeon: Adrian Prows, MD;  Location: [facility] CV LAB;  Service: Cardiovascular;  Laterality: N/A;  . CARPAL TUNNEL RELEASE Bilateral   . CATARACT EXTRACTION W/ INTRAOCULAR LENS  IMPLANT, BILATERAL Bilateral   . CHOLECYSTECTOMY OPEN    . COLONOSCOPY  N/A 12/10/2013   Procedure: COLONOSCOPY;  Surgeon: Inda Castle, MD;  Location: [facility] ENDOSCOPY;  Service: Endoscopy;  Laterality: N/A;  . FRACTURE SURGERY    . INCISION AND DRAINAGE ABSCESS Left    foot "under my little toe"  . KNEE LIGAMENT RECONSTRUCTION Right   . LEFT HEART CATHETERIZATION WITH CORONARY ANGIOGRAM N/A 08/08/2012   Procedure: LEFT HEART CATHETERIZATION WITH CORONARY ANGIOGRAM;  Surgeon: [name], MD;  Location: [facility] CATH LAB;  Service: Cardiovascular;  Laterality: N/A;  . NASAL SINUS SURGERY    . PERCUTANEOUS CORONARY STENT INTERVENTION (PCI-S) N/A 08/21/2012   Procedure: PERCUTANEOUS CORONARY STENT INTERVENTION (PCI-S);  Surgeon: [name], MD;  Location: [facility] CATH LAB;  Service: Cardiovascular;  Laterality: N/A;  . TUBAL LIGATION       OB History   No obstetric history on file.     Family History  Problem Relation Age of Onset  . Heart disease Mother   . Diabetes Mother   . Clotting disorder Mother   . Pneumonia Father   . Rheum arthritis Father   . Diabetes Sister   . Diabetes Sister   . Asthma Brother   . Cancer Brother   . Kidney disease Brother   . Lupus Son   . Heart disease Son   . Heart disease Daughter   . Colon cancer Neg Hx     Social History   Tobacco Use  . Smoking status: Former Smoker    Packs/day: 3.00    Years: 40.00    Pack years: 120.00    Types: Cigarettes    Start date: 07/05/1960    Quit date: 07/05/1998    Years since quitting: 21.9  . Smokeless tobacco: Never Used  Vaping Use  . Vaping Use: Never used  Substance Use Topics  . Alcohol use: No    Alcohol/week: 0.0 standard drinks    Comment: havent drank in over 40 years  . Drug use: No    Home Medications Prior to Admission medications   Medication Sig Start Date End Date Taking? Authorizing Provider  Accu-Chek Softclix Lancets lancets Use to test blood sugars 1-2 times daily.Dx:e11.9 05/12/20   [name], [name], MD  acetaminophen (TYLENOL) 500 MG tablet Take  500 mg by mouth every 6 (six) hours as needed for moderate pain.    [provider]  allopurinol (ZYLOPRIM) 100 MG tablet Take 2 tablets (200 mg total) by mouth daily. 02/18/20   [name], [name], MD  Ascorbic Acid (VITAMIN C) 500 MG CAPS Take 1 capsule by mouth daily. 02/26/20   [name], [name], MD  atorvastatin (LIPITOR) 20 MG tablet TAKE 1 TABLET(20 MG) BY MOUTH DAILY 06/03/20   [name], [name], MD  B Complex Vitamins (VITAMIN B COMPLEX) TABS Take 1 tablet by mouth daily.    [provider]  Blood Glucose Monitoring Suppl (ACCU-CHEK GUIDE) w/Device KIT Use to test blood sugars 1-2 times daily. Dx:e11.9 05/14/20   [name], [name], MD  cephALEXin (KEFLEX) 250 MG capsule Take 2 capsules (500 mg total) by mouth 2 (two) times daily for 5 days. 06/05/20 06/10/20  Carmin Muskrat, MD  Cholecalciferol (VITAMIN D3) 5000 UNITS CAPS Take 5,000 Units by mouth daily.    [provider]  colchicine 0.6 MG tablet 1 tab daily for 7 days then as needed for acute attacks. 03/14/20   [name], [name], MD  DULoxetine (CYMBALTA) 60 MG capsule Take 1 capsule (60 mg total) by mouth daily. 10/20/19   [name], [name], MD  ferrous sulfate 325 (65 FE) MG tablet Take 325 mg by mouth 2 (two) times daily with a meal.     [provider]  Fluocinolone Acetonide Scalp 0.01 % OIL Apply 1 application topically at bedtime as needed (itching scalp).  07/30/19   [provider]  gabapentin (NEURONTIN) 100 MG capsule TAKE 1 CAPSULE(100 MG) BY MOUTH TWICE DAILY 05/02/20   [name], [name], MD  glucose blood (ACCU-CHEK GUIDE) test strip Use to test blood sugars 1-2 times daily. Dx: e11.9 05/14/20   [name], [name], MD  guaiFENesin-dextromethorphan [facility] DM) 100-10 MG/5ML syrup Take 5 mLs by mouth every 4 (four) hours as needed for cough. 11/08/19   Shelly Coss, MD  Insulin NPH, Human,, Isophane, (HUMULIN N KWIKPEN) 100 UNIT/ML Kiwkpen ADMINISTER 30 UNITS UNDER THE SKIN EVERY MORNING 03/31/20    [name], [name], MD  Insulin Pen Needle (B-D ULTRAFINE III SHORT PEN) 31G X 8 MM MISC CHECK BLOOD SUGAR 3 TIMES A DAY 03/15/19   [name], [name], MD  isosorbide mononitrate (IMDUR) 30 MG 24 hr tablet TAKE 1 TABLET(30 MG) BY MOUTH DAILY 02/04/20   Patwardhan, Reynold Bowen, MD  levETIRAcetam (KEPPRA) 500 MG tablet Take 1 tablet (500 mg total) by mouth 2 (two) times daily. 07/30/19   Suzzanne Cloud, NP  linaclotide Rolan Lipa) 145 MCG CAPS capsule Take 1 capsule (145 mcg total) by mouth daily as needed (abdominal cramping). 12/07/19   Hongalgi, Lenis Dickinson, MD  memantine (NAMENDA) 10 MG tablet Take 10 mg by mouth daily.    [provider]  metoprolol succinate (TOPROL-XL) 50 MG 24 hr tablet TAKE 1 TABLET BY MOUTH EVERY DAY. TAKE WITH OR IMMEDIATELY FOLLOWING A MEAL 12/17/19   [name], [name], MD  montelukast (SINGULAIR) 10 MG tablet TAKE 1 TABLET(10 MG) BY MOUTH AT BEDTIME 03/05/20   [name], [name], MD  Omega-3 Fatty Acids (FISH OIL) 1000 MG CAPS Take 1,000 mg by mouth daily.     [provider]  pantoprazole (PROTONIX) 40 MG tablet TAKE 1 TABLET(40 MG) BY MOUTH DAILY Patient taking differently: Take 40 mg by mouth daily.  08/28/19   [name], [name], MD  Polyethyl Glycol-Propyl Glycol (SYSTANE) 0.4-0.3 % GEL ophthalmic gel Place 1 application into both eyes every 4 (four) hours as needed (dry eyes).    [provider]  PROAIR HFA 108 (90 Base) MCG/ACT inhaler INHALE 2 PUFF BY MOUTH EVERY 4 HOURS AS NEEDED USE ONLY IF YOU ARE WHEEZING Patient taking differently: Inhale 2 puffs into the lungs every 4 (four) hours as needed for wheezing.  10/17/19   [name], [name], MD  Spacer/Aero-Holding Chambers (AEROCHAMBER PLUS) inhaler Use as instructed to use with inahaler. 07/17/19   [name], [name], MD  SYMBICORT 80-4.5 MCG/ACT inhaler INHALE 2 PUFFS INTO THE  LUNGS TWICE DAILY 03/31/20   [name], [name], MD  torsemide (DEMADEX) 20 MG tablet Take 1 tablet (20 mg total) by mouth 2 (two) times daily. 01/11/20    [name], [name], MD    Allergies    Ace inhibitors, Isosorb dinitrate-hydralazine, Penicillins, Buspirone, Pregabalin, Ropinirole hydrochloride, and Amantadines  Review of Systems   Review of Systems  Unable to perform ROS: Dementia    Physical Exam Updated Vital Signs BP 137/72   Pulse (!) 50   Temp 98 F (36.7 C)   Resp 15   SpO2 100%   Physical Exam Vitals and nursing note reviewed.  Constitutional:      General: She is not in acute distress.    Appearance: She is well-developed. She is obese. She is not ill-appearing or diaphoretic.  HENT:     Head: Normocephalic and atraumatic.  Eyes:     Conjunctiva/sclera: Conjunctivae normal.  Cardiovascular:     Rate and Rhythm: Normal rate and regular rhythm.     Pulses: Normal pulses.     Heart sounds: Normal heart sounds. No murmur heard.   Pulmonary:     Effort: Pulmonary effort is normal. No respiratory distress.     Breath sounds: Normal breath sounds.  Abdominal:     General: Abdomen is flat.     Palpations: Abdomen is soft.     Tenderness: There is no abdominal tenderness.  Musculoskeletal:        General: No deformity. Normal range of motion.     Cervical back: Normal range of motion and neck supple.     Right lower leg: No edema.     Left lower leg: No edema.     Comments: No evidence of leg shortening, internal or external rotation  Skin:    General: Skin is warm and dry.     Capillary Refill: Capillary refill takes less than 2 seconds.     Findings: No erythema or rash.  Neurological:     General: No focal deficit present.     Mental Status: She is alert and oriented to person, place, and time.     Sensory: No sensory deficit.     Motor: No weakness.     Comments: Alert, slightly withdrawn, oriented to person and time, she moves all extremities spontaneously, and follows commands, smile equal, no noticeable facial droop or slurred speech. Follows commands without difficulty. Word finding difficulty noted        ED Results / Procedures / Treatments   Labs (all labs ordered are listed, but only abnormal results are displayed) Labs Reviewed  CBC WITH DIFFERENTIAL/PLATELET - Abnormal; Notable for the following components:      Result Value   WBC 14.0 (*)    Hemoglobin 11.7 (*)    HCT 34.9 (*)    MCV 75.7 (*)    MCH 25.4 (*)    Neutro Abs 11.0 (*)    Monocytes Absolute 1.1 (*)    Abs Immature Granulocytes 0.09 (*)    All other components within normal limits  COMPREHENSIVE METABOLIC PANEL  URINALYSIS, ROUTINE W REFLEX MICROSCOPIC  CK  AMMONIA  RAPID URINE DRUG SCREEN, HOSP PERFORMED  TSH    EKG None  Radiology DG Chest Portable 1 View  Result Date: 06/07/2020 CLINICAL DATA:  Altered mental status. EXAM: PORTABLE CHEST 1 VIEW COMPARISON:  06/05/2020 FINDINGS: 1424 hours. Low lung volumes. The cardio pericardial silhouette is enlarged. There is pulmonary vascular congestion without overt pulmonary edema. Diffuse interstitial opacities  suggest edema. No substantial pleural effusion. Bones are diffusely demineralized. IMPRESSION: Low volume film with cardiomegaly, vascular congestion and interstitial pulmonary edema. Electronically Signed   By: Misty Stanley M.D.   On: 06/07/2020 14:34    Procedures Procedures (including critical care time)  Medications Ordered in ED Medications - No data to display  ED Course  I have reviewed the triage vital signs and the nursing notes.  Pertinent labs & imaging results that were available during my care of the patient were reviewed by me and considered in my medical decision making (see chart for details).    MDM Rules/Calculators/A&P                          79 year old female with worsening altered mental status since her visit here in the ER on 12/2 On [place]ation, she is alert and oriented x2, no focal neurologic deficits noted.  Moving her lower extremities with some restricted range of motion but no evidence of leg shortening or  internal or external rotation.  The differential diagnosis for AMS is extensive and includes, but is not limited to: drug overdose - opioids, alcohol, sedatives, antipsychotics, drug withdrawal, others; Metabolic: hypoxia, hypoglycemia, hyperglycemia, hypercalcemia, hypernatremia, hyponatremia, uremia, hepatic encephalopathy, hypothyroidism, hyperthyroidism, vitamin B12 or thiamine deficiency, carbon monoxide poisoning, Wilson's disease, Lactic acidosis,DKA/HHOS; Infectious: meningitis, encephalitis, bacteremia/sepsis, urinary tract infection, pneumonia, neurosyphilis; Structural: Space-occupying lesion, (brain tumor, subdural hematoma, hydrocephalus,); Vascular: stroke, subarachnoid hemorrhage, coronary ischemia, hypertensive encephalopathy, CNS vasculitis, thrombotic thrombocytopenic purpura, disseminated intravascular coagulation, hyperviscosity; Psychiatric: Schizophrenia, depression; Other: Seizure, hypothermia, heat stroke, ICU psychosis, dementia -"sundowning."   Plan to initiate altered mental status work-up, with repeat labs, UA, ammonia, TSH, CK, given fall, CT of the head and neck and chest x-ray with EKG.  Patient may require admission for possible stroke workup/AMS/failure to thrive   Chest xray with pulmonary, vascular congestion, low lung volumes.  CBC with a white count of 14, hemoglobin 11.7 which appears to be stable  Signed out care to [facility] who will oversee her workup and discharge appropriately.    Final Clinical Impression(s) / ED Diagnoses Final diagnoses:  None    Rx / DC Orders ED Discharge Orders    None       Garald Balding, PA-C 06/07/20 1543    Lucrezia Starch, MD 06/09/20 [phone]

## 2020-06-07 NOTE — ED Triage Notes (Signed)
Patient arrived by [facility] from home after being found in floor by family. Patient arrived on home oxygen at 3L which is her normal. Patient has no complaints and is at her baseline with dementia. Patient alert and appears in no distress

## 2020-06-07 NOTE — H&P (Addendum)
History and Physical  Brittany Roberts WVP:[id number] DOB: 04-10-1941 DOA: 06/07/2020  Referring physician: Jory Roberts, [place] PCP: [name], [name], MD  Outpatient Specialists: Cardiology. Patient coming from: Home.  Chief Complaint: Worsening confusion  HPI: Brittany Roberts is a 79 y.o. female with medical history significant for prior CVA, chronic diastolic CHF, coronary artery disease status post PCI, obesity, essential hypertension, type 2 diabetes, peripheral neuropathy, gout, chronic anxiety/depression, seizure disorder, GERD, asthma, memory deficit/not officially diagnosed with dementia, lupus, LV apical thrombus completed 3 months of anticoagulation, COVID-19 viral infection March 2021, who presented to [facility] ED from home due to worsening altered mental status.  History is obtained from [name], her daughter at bedside, and review of medical records.  Patient was seen in the ED few days prior to this presentation for the same, was found to have a UTI and was sent home on Keflex.  Per daughter she has had worsening confusion.  She had a fall today was possibly down for more than an hour.  CT head and cervical spine nonacute.  Chest x-ray consistent with pulmonary edema.  Work-up in the ED revealed AKI.  TRH asked to admit.  ED Course: Afebrile.  Lab studies remarkable for WBC 14.0K with neutrophilia, hemoglobin 11.7K with MCV of 75, BUN 63, creatinine 3.17 with baseline creatinine of 1.6 with GFR of 33, CPK 317.  Review of Systems: Review of systems as noted in the HPI. All other systems reviewed and are negative.   Past Medical History:  Diagnosis Date  . Acute delirium 01/14/2017  . Anxiety   . Arthritis    "all over"  . Asthma   . Bipolar disorder ([place])   . Complication of anesthesia    "w/right foot OR they gave me too much and they couldn't get me woke"  . Congestive heart failure, unspecified   . Coronary artery calcification seen on CAT scan 01/14/2017  . Coronary artery disease    "I've  got 1 stent" (06/30/2015)  . Depression   . Discoid lupus   . Fibromyalgia    "I've been told I have this; can't take Lyrica cause I'm allergic to it" (06/30/2015)  . GERD (gastroesophageal reflux disease)   . History of gout   . History of hiatal hernia    "real bad" (06/30/2015)  . Hypertension   . Hypothyroidism   . Memory loss   . On home oxygen therapy    "2L q hs" (06/30/2015)  . OSA (obstructive sleep apnea)    "just use my oxygen; no mask" (06/30/2015)  . Other and unspecified hyperlipidemia   . Penetrating foot wound    left nonhealing foot wound on the dorsal surface  . Peripheral neuropathy   . Pneumonia "many of times"  . Refusal of blood transfusions as patient is Jehovah's Witness   . Renal failure, unspecified    "kidneys not working 100%" (06/30/2015)  . Seizure ([place])   . Sickle cell trait ([place])   . Systemic lupus ([place])   . Thoracic aortic atherosclerosis ([place]) 01/14/2017  . Type II diabetes mellitus ([place])   . Unspecified vitamin D deficiency    Past Surgical History:  Procedure Laterality Date  . ANKLE ARTHROSCOPY WITH OPEN REDUCTION INTERNAL FIXATION (ORIF) Right 03/2011  . CARDIAC CATHETERIZATION N/A 07/01/2015   Procedure: Left Heart Cath and Coronary Angiography;  Surgeon: Adrian Prows, MD;  Location: [facility] CV LAB;  Service: Cardiovascular;  Laterality: N/A;  . CARDIAC CATHETERIZATION N/A 07/01/2015  Procedure: Intravascular Pressure Wire/FFR Study;  Surgeon: Adrian Prows, MD;  Location: [place] CV LAB;  Service: Cardiovascular;  Laterality: N/A;  . CARPAL TUNNEL RELEASE Bilateral   . CATARACT EXTRACTION W/ INTRAOCULAR LENS  IMPLANT, BILATERAL Bilateral   . CHOLECYSTECTOMY OPEN    . COLONOSCOPY N/A 12/10/2013   Procedure: COLONOSCOPY;  Surgeon: Inda Castle, MD;  Location: [facility] ENDOSCOPY;  Service: Endoscopy;  Laterality: N/A;  . FRACTURE SURGERY    . INCISION AND DRAINAGE ABSCESS Left    foot "under my little toe"  . KNEE LIGAMENT RECONSTRUCTION  Right   . LEFT HEART CATHETERIZATION WITH CORONARY ANGIOGRAM N/A 08/08/2012   Procedure: LEFT HEART CATHETERIZATION WITH CORONARY ANGIOGRAM;  Surgeon: [name], MD;  Location: [facility] CATH LAB;  Service: Cardiovascular;  Laterality: N/A;  . NASAL SINUS SURGERY    . PERCUTANEOUS CORONARY STENT INTERVENTION (PCI-S) N/A 08/21/2012   Procedure: PERCUTANEOUS CORONARY STENT INTERVENTION (PCI-S);  Surgeon: [name], MD;  Location: [facility] CATH LAB;  Service: Cardiovascular;  Laterality: N/A;  . TUBAL LIGATION      Social History:  reports that she quit smoking about 21 years ago. Her smoking use included cigarettes. She started smoking about 59 years ago. She has a 120.00 pack-year smoking history. She has never used smokeless tobacco. She reports that she does not drink alcohol and does not use drugs.   Allergies  Allergen Reactions  . Ace Inhibitors Other (See Comments)    Coincided with sig bump in creat. Retried and creat bumped again.   . Isosorb Dinitrate-Hydralazine Other (See Comments)    Sleep all the time.   Marland Kitchen Penicillins Anaphylaxis, Swelling and Rash    Has patient had a PCN reaction causing immediate rash, facial/tongue/throat swelling, SOB or lightheadedness with hypotension: Yes Has patient had a PCN reaction causing severe rash involving mucus membranes or skin necrosis: Yes Has patient had a PCN reaction that required hospitalization: Yes Has patient had a PCN reaction occurring within the last 10 years: No If all of the above answers are "NO", then may proceed with Cephalosporin use.   Marland Kitchen Buspirone Other (See Comments)    pain   . Pregabalin Swelling  . Ropinirole Hydrochloride Swelling  . Amantadines Rash    "Swelling of the tongue"    Family History  Problem Relation Age of Onset  . Heart disease Mother   . Diabetes Mother   . Clotting disorder Mother   . Pneumonia Father   . Rheum arthritis Father   . Diabetes Sister   . Diabetes Sister   . Asthma  Brother   . Cancer Brother   . Kidney disease Brother   . Lupus Son   . Heart disease Son   . Heart disease Daughter   . Colon cancer Neg Hx       Prior to Admission medications   Medication Sig Start Date End Date Taking? Authorizing Provider  Accu-Chek Softclix Lancets lancets Use to test blood sugars 1-2 times daily.Dx:e11.9 05/12/20   [name], [name], MD  acetaminophen (TYLENOL) 500 MG tablet Take 500 mg by mouth every 6 (six) hours as needed for moderate pain.    [provider]  allopurinol (ZYLOPRIM) 100 MG tablet Take 2 tablets (200 mg total) by mouth daily. 02/18/20   [name], [name], MD  Ascorbic Acid (VITAMIN C) 500 MG CAPS Take 1 capsule by mouth daily. 02/26/20   [name], [name], MD  atorvastatin (LIPITOR) 20 MG tablet TAKE 1 TABLET(20  MG) BY MOUTH DAILY 06/03/20   [name], [name], MD  B Complex Vitamins (VITAMIN B COMPLEX) TABS Take 1 tablet by mouth daily.    [provider]  Blood Glucose Monitoring Suppl (ACCU-CHEK GUIDE) w/Device KIT Use to test blood sugars 1-2 times daily. Dx:e11.9 05/14/20   [name], [name], MD  cephALEXin (KEFLEX) 250 MG capsule Take 2 capsules (500 mg total) by mouth 2 (two) times daily for 5 days. 06/05/20 06/10/20  Carmin Muskrat, MD  Cholecalciferol (VITAMIN D3) 5000 UNITS CAPS Take 5,000 Units by mouth daily.    [provider]  colchicine 0.6 MG tablet 1 tab daily for 7 days then as needed for acute attacks. 03/14/20   [name], [name], MD  DULoxetine (CYMBALTA) 60 MG capsule Take 1 capsule (60 mg total) by mouth daily. 10/20/19   [name], [name], MD  ferrous sulfate 325 (65 FE) MG tablet Take 325 mg by mouth 2 (two) times daily with a meal.     [provider]  Fluocinolone Acetonide Scalp 0.01 % OIL Apply 1 application topically at bedtime as needed (itching scalp).  07/30/19   [provider]  gabapentin (NEURONTIN) 100 MG capsule TAKE 1 CAPSULE(100 MG) BY MOUTH TWICE DAILY 05/02/20   [name], [name], MD   glucose blood (ACCU-CHEK GUIDE) test strip Use to test blood sugars 1-2 times daily. Dx: e11.9 05/14/20   [name], [name], MD  guaiFENesin-dextromethorphan [facility] DM) 100-10 MG/5ML syrup Take 5 mLs by mouth every 4 (four) hours as needed for cough. 11/08/19   Shelly Coss, MD  Insulin NPH, Human,, Isophane, (HUMULIN N KWIKPEN) 100 UNIT/ML Kiwkpen ADMINISTER 30 UNITS UNDER THE SKIN EVERY MORNING 03/31/20   [name], [name], MD  Insulin Pen Needle (B-D ULTRAFINE III SHORT PEN) 31G X 8 MM MISC CHECK BLOOD SUGAR 3 TIMES A DAY 03/15/19   [name], [name], MD  isosorbide mononitrate (IMDUR) 30 MG 24 hr tablet TAKE 1 TABLET(30 MG) BY MOUTH DAILY 02/04/20   Patwardhan, Reynold Bowen, MD  levETIRAcetam (KEPPRA) 500 MG tablet Take 1 tablet (500 mg total) by mouth 2 (two) times daily. 07/30/19   Suzzanne Cloud, NP  linaclotide Rolan Lipa) 145 MCG CAPS capsule Take 1 capsule (145 mcg total) by mouth daily as needed (abdominal cramping). 12/07/19   Hongalgi, Lenis Dickinson, MD  memantine (NAMENDA) 10 MG tablet Take 10 mg by mouth daily.    [provider]  metoprolol succinate (TOPROL-XL) 50 MG 24 hr tablet TAKE 1 TABLET BY MOUTH EVERY DAY. TAKE WITH OR IMMEDIATELY FOLLOWING A MEAL 12/17/19   [name], [name], MD  montelukast (SINGULAIR) 10 MG tablet TAKE 1 TABLET(10 MG) BY MOUTH AT BEDTIME 03/05/20   [name], [name], MD  Omega-3 Fatty Acids (FISH OIL) 1000 MG CAPS Take 1,000 mg by mouth daily.     [provider]  pantoprazole (PROTONIX) 40 MG tablet TAKE 1 TABLET(40 MG) BY MOUTH DAILY Patient taking differently: Take 40 mg by mouth daily.  08/28/19   [name], [name], MD  Polyethyl Glycol-Propyl Glycol (SYSTANE) 0.4-0.3 % GEL ophthalmic gel Place 1 application into both eyes every 4 (four) hours as needed (dry eyes).    [provider]  PROAIR HFA 108 (90 Base) MCG/ACT inhaler INHALE 2 PUFF BY MOUTH EVERY 4 HOURS AS NEEDED USE ONLY IF YOU ARE WHEEZING Patient taking differently: Inhale 2 puffs into the  lungs every 4 (four) hours as needed for wheezing.  10/17/19   [name], [name],  MD  Spacer/Aero-Holding Chambers (AEROCHAMBER PLUS) inhaler Use as instructed to use with inahaler. 07/17/19   [name], [name], MD  SYMBICORT 80-4.5 MCG/ACT inhaler INHALE 2 PUFFS INTO THE LUNGS TWICE DAILY 03/31/20   [name], [name], MD  torsemide (DEMADEX) 20 MG tablet Take 1 tablet (20 mg total) by mouth 2 (two) times daily. 01/11/20   [name], [name], MD    Physical Exam: BP (!) 139/54   Pulse (!) 50   Temp 98 F (36.7 C)   Resp 14   SpO2 100%   . General: 79 y.o. year-old female well developed well nourished in no acute distress.  Alert and confused. . Cardiovascular: Irregular rate and rhythm with no rubs or gallops.  No thyromegaly or JVD noted.  Trace lower extremity edema bilaterally. Marland Kitchen Respiratory: Diffuse rales bilaterally with no wheezing noted.  Poor inspiratory effort. . Abdomen: Soft obese nontender nondistended with normal bowel sounds x4 quadrants. . Muskuloskeletal: No cyanosis or clubbing.  Trace edema in lower extremities bilaterally.  . Neuro: CN II-XII intact, strength, sensation, reflexes . Skin: No ulcerative lesions noted or rashes . Psychiatry: Judgement and insight appear altered. Mood is appropriate for condition and setting          Labs on Admission:  Basic Metabolic Panel: Recent Labs  Lab 06/05/20 1156 06/07/20 1458  NA 139 139  K 3.6 3.9  CL 101 101  CO2 27 25  GLUCOSE 226* 252*  BUN 69* 63*  CREATININE 2.51* 3.17*  CALCIUM 11.2* 12.0*   Liver Function Tests: Recent Labs  Lab 06/05/20 1156 06/07/20 1458  AST 25 38  ALT 18 21  ALKPHOS 105 97  BILITOT 0.8 0.8  PROT 7.7 7.5  ALBUMIN 3.5 3.4*   No results for input(s): LIPASE, AMYLASE in the last 168 hours. Recent Labs  Lab 06/07/20 1520  AMMONIA <9*   CBC: Recent Labs  Lab 06/05/20 1156 06/07/20 1458  WBC 13.0* 14.0*  NEUTROABS 9.2* 11.0*  HGB 10.7* 11.7*  HCT 31.5* 34.9*  MCV 75.9* 75.7*  PLT  180 179   Cardiac Enzymes: Recent Labs  Lab 06/07/20 1458  CKTOTAL 317*    BNP (last 3 results) Recent Labs    11/01/19 1120 11/02/19 0626 12/05/19 1139  BNP 111.4* 89.1 505.5*    ProBNP (last 3 results) No results for input(s): PROBNP in the last 8760 hours.  CBG: No results for input(s): GLUCAP in the last 168 hours.  Radiological Exams on Admission: CT Head Wo Contrast  Result Date: 06/07/2020 CLINICAL DATA:  Found down EXAM: CT HEAD WITHOUT CONTRAST TECHNIQUE: Contiguous axial images were obtained from the base of the skull through the vertex without intravenous contrast. COMPARISON:  06/05/2020 FINDINGS: Brain: Stable encephalomalacia right frontal lobe from prior infarct. No signs of acute infarct or hemorrhage. Lateral ventricles and midline structures are unremarkable. No acute extra-axial fluid collections. No mass effect. Vascular: Stable atherosclerosis.  No hyperdense vessel. Skull: Normal. Negative for fracture or focal lesion. Sinuses/Orbits: Postsurgical and chronic changes of the right maxillary sinus are stable. Remaining sinuses are clear. Other: None. IMPRESSION: 1. Stable head CT, no acute process. Electronically Signed   By: Randa Ngo M.D.   On: 06/07/2020 18:13   CT Cervical Spine Wo Contrast  Result Date: 06/07/2020 CLINICAL DATA:  Found down EXAM: CT CERVICAL SPINE WITHOUT CONTRAST TECHNIQUE: Multidetector CT imaging of the cervical spine was performed without intravenous contrast. Multiplanar CT image reconstructions were also generated. COMPARISON:  None. FINDINGS: Alignment:  Alignment is grossly anatomic. Skull base and vertebrae: Evaluation of the lower cervical spine is limited due to body habitus and patient motion. No gross bony abnormalities. No evidence of displaced fracture. Soft tissues and spinal canal: Heterogeneous enlargement of the thyroid is again noted, previously evaluated by ultrasound and biopsy. No prevertebral soft tissue swelling. No  visible canal hematoma. Disc levels: There is mild diffuse facet hypertrophy. Disc spaces appear grossly well preserved. No significant compressive sequelae on this exam limited by motion and body habitus. Upper chest: Airway is patent, with deviation of the trachea to the right from the enlarged left lobe thyroid. Emphysematous changes are seen at the lung apices. Other: Reconstructed images demonstrate no additional findings. IMPRESSION: 1. Study limited by patient motion and body habitus. 2. No evidence of cervical spine fracture. Electronically Signed   By: Randa Ngo M.D.   On: 06/07/2020 18:15   DG Chest Portable 1 View  Result Date: 06/07/2020 CLINICAL DATA:  Altered mental status. EXAM: PORTABLE CHEST 1 VIEW COMPARISON:  06/05/2020 FINDINGS: 1424 hours. Low lung volumes. The cardio pericardial silhouette is enlarged. There is pulmonary vascular congestion without overt pulmonary edema. Diffuse interstitial opacities suggest edema. No substantial pleural effusion. Bones are diffusely demineralized. IMPRESSION: Low volume film with cardiomegaly, vascular congestion and interstitial pulmonary edema. Electronically Signed   By: Misty Stanley M.D.   On: 06/07/2020 14:34    EKG: I independently viewed the EKG done and my findings are as followed: A. fib, rate of 79.  No specific ST-T changes.  Assessment/Plan Present on Admission: . AMS (altered mental status)  Active Problems:   AMS (altered mental status)  Altered mental status/acute metabolic encephalopathy, likely secondary to presumed UTI, POA Seen in the ED 2 days prior with UA positive for pyuria, was sent home on Keflex.  No urine cultures available. Obtain UA and urine culture to narrow down antibiotics Start Rocephin empirically for presumed UTI CT head nonacute Reorient as needed Fall, aspiration, delirium precautions  Acute on chronic diastolic CHF Chest x-ray done on admission, personally reviewed showing cardiomegaly with  increase in pulmonary vascularity, suggestive of pulmonary edema. Last 2D echo showing LVEF 50 to 55% with grade 1 diastolic dysfunction. Obtain BNP and 2D echo Strict I's and O's and daily weight  AKI on CKD 3B, likely prerenal in the setting of dehydration from poor oral intake Baseline creatinine appears to be 1.6 with GFR of 33 Presented with creatinine of 3.17 with GFR of 14 Obtain UA and renal ultrasound Avoid nephrotoxins, dehydration and hypotension Started gentle IV fluid hydration LR at 50 cc/h x 1 day Closely monitor volume status while on gentle IV fluid in the setting of cardiomyopathy Closely monitor urine output Daily renal panel  Presumed UTI Follow urine cultures for ID and sensitivities Rest of management as stated above  Type 2 diabetes with hyperglycemia Obtain hemoglobin A1c Start insulin sliding scale  Ambulatory dysfunction with a fall PT OT to assess Fall precautions  Elevated CPK post fall Presented with CPK in the 300s Start gentle IV fluid hydration LR at 50 cc/h x1 day Closely monitor volume status while on IV fluid in the setting of acute on chronic diastolic CHF Repeat CPK in the morning  Seizure disorder Start IV Keppra 500 mg twice daily to ensure patient will not miss her doses, switch back to oral once patient is compliant in takes adequate oral intake Seizure precautions  Chronic anxiety/depression/GERD/peripheral neuropathy Resume home regimen  History of COVID-19 in  March 2021 Continue home multivitamins, vitamin D3, bronchodilators  History of LV apical thrombus Completed 3 months of anticoagulation  Blood products refusal, patient is Jehovah witness No blood transfusion per her daughter at bedside.  Prior history of CVA/coronary artery disease Not on any antiplatelets or statin, unclear if she has any contraindications or intolerances to these. Defer management to her neurologist and cardiologist outpatient   DVT  prophylaxis: Subcu heparin 3 times daily  Code Status: DNR, as stated by the patient's daughter at bedside.  Family Communication: Updated the patient's daughter [name] at bedside.  Disposition Plan: Admit to telemetry medical  Consults called: None.  Admission status: Inpatient status.  Patient will require at least 2 midnights for further evaluation and treatment of present condition.   Status is: Inpatient   Dispo:  Patient From: Home  Planned Disposition:SNF  Expected discharge date: 06/09/20  Medically stable for discharge: No, ongoing management of acute metabolic encephalopathy, presumed UTI and prerenal AKI.        Kayleen Memos MD [facility] Pager [phone]  If 7PM-7AM, please contact night-coverage [email or web address] Password [facility]  06/07/2020, 7:34 PM

## 2020-06-07 NOTE — ED Notes (Signed)
Pt has not urinated while here. Dr. Nevada Crane is aware.

## 2020-06-07 NOTE — ED Notes (Signed)
Attempted to give report to 31M rm 12 nurse. Nurse is finishing up in another room and will call back.

## 2020-06-07 NOTE — ED Provider Notes (Signed)
Care assumed from Jamey Ripa  PA-C at shift change pending labs and imaging.  See her note for full H&P.   Plan is to follow up on results and disposition accordingly. Anticipate admission.  Briefly this is an 79 year old female w/ hx of dementia presenting for altered mental status, was found down by family for unknown length of time, coming from home. Seen x 2 days ago at [facility] ED for worsening confusion beyond baseline, found to be dehydrated and with UTI. Treated with keflex and sent home. Daughter at the bedside reports not taking meds, eating or drinking worsening x 2 days.  Exam by previous provider significant for AxO x 2 to person and time only. Equal strength in all extremities. All labs and imaging in process at time of sign out.   Physical Exam  BP 125/71   Pulse (!) 56   Temp 98 F (36.7 C)   Resp 14   SpO2 100%   Physical Exam  PE: Constitutional: Obese, well-nourished, no apparent distress HENT: normocephalic, atraumatic. no cervical adenopathy Cardiovascular: normal rate and rhythm, distal pulses intact Pulmonary/Chest: effort normal; breath sounds clear and equal bilaterally; no wheezes or rales. On 3L [place] which is home regimen Abdominal: soft and nontender Musculoskeletal: full ROM, no edema Neurological: alert to self and time only. Strong and equal grip strength in all extremities. No focal weakeness Skin: warm and dry, no rash, no diaphoresis Psychiatric: normal mood and affect, normal behavior    ED Course/Procedures    Results for orders placed or performed during the hospital encounter of 06/07/20 (from the past 24 hour(s))  CBC with Differential     Status: Abnormal   Collection Time: 06/07/20  2:58 PM  Result Value Ref Range   WBC 14.0 (H) 4.0 - 10.5 K/uL   RBC 4.61 3.87 - 5.11 MIL/uL   Hemoglobin 11.7 (L) 12.0 - 15.0 g/dL   HCT 34.9 (L) 36 - 46 %   MCV 75.7 (L) 80.0 - 100.0 fL   MCH 25.4 (L) 26.0 - 34.0 pg   MCHC 33.5 30.0 - 36.0 g/dL   RDW 15.5 11.5 -  15.5 %   Platelets 179 150 - 400 K/uL   nRBC 0.1 0.0 - 0.2 %   Neutrophils Relative % 79 %   Neutro Abs 11.0 (H) 1.7 - 7.7 K/uL   Lymphocytes Relative 11 %   Lymphs Abs 1.6 0.7 - 4.0 K/uL   Monocytes Relative 8 %   Monocytes Absolute 1.1 (H) 0.1 - 1.0 K/uL   Eosinophils Relative 1 %   Eosinophils Absolute 0.1 0.0 - 0.5 K/uL   Basophils Relative 0 %   Basophils Absolute 0.1 0.0 - 0.1 K/uL   Immature Granulocytes 1 %   Abs Immature Granulocytes 0.09 (H) 0.00 - 0.07 K/uL  Comprehensive metabolic panel     Status: Abnormal   Collection Time: 06/07/20  2:58 PM  Result Value Ref Range   Sodium 139 135 - 145 mmol/L   Potassium 3.9 3.5 - 5.1 mmol/L   Chloride 101 98 - 111 mmol/L   CO2 25 22 - 32 mmol/L   Glucose, Bld 252 (H) 70 - 99 mg/dL   BUN 63 (H) 8 - 23 mg/dL   Creatinine, Ser 3.17 (H) 0.44 - 1.00 mg/dL   Calcium 12.0 (H) 8.9 - 10.3 mg/dL   Total Protein 7.5 6.5 - 8.1 g/dL   Albumin 3.4 (L) 3.5 - 5.0 g/dL   AST 38 15 - 41 U/L  ALT 21 0 - 44 U/L   Alkaline Phosphatase 97 38 - 126 U/L   Total Bilirubin 0.8 0.3 - 1.2 mg/dL   GFR, Estimated 14 (L) >60 mL/min   Anion gap 13 5 - 15  CK     Status: Abnormal   Collection Time: 06/07/20  2:58 PM  Result Value Ref Range   Total CK 317 (H) 38.0 - 234.0 U/L  TSH     Status: None   Collection Time: 06/07/20  2:58 PM  Result Value Ref Range   TSH 2.934 0.350 - 4.500 uIU/mL  Ammonia     Status: Abnormal   Collection Time: 06/07/20  3:20 PM  Result Value Ref Range   Ammonia <9 (L) 9 - 35 umol/L  Resp Panel by RT-PCR (Flu A&B, Covid) Nasopharyngeal Swab     Status: None   Collection Time: 06/07/20  4:20 PM   Specimen: Nasopharyngeal Swab; Nasopharyngeal(NP) swabs in vial transport medium  Result Value Ref Range   SARS Coronavirus 2 by RT PCR NEGATIVE NEGATIVE   Influenza A by PCR NEGATIVE NEGATIVE   Influenza B by PCR NEGATIVE NEGATIVE   PORTABLE CHEST 1 VIEW    COMPARISON: 06/05/2020    FINDINGS:  1424 hours. Low lung  volumes. The cardio pericardial silhouette is  enlarged. There is pulmonary vascular congestion without overt  pulmonary edema. Diffuse interstitial opacities suggest edema. No  substantial pleural effusion. Bones are diffusely demineralized.    IMPRESSION:  Low volume film with cardiomegaly, vascular congestion and  interstitial pulmonary edema.      Electronically Signed  By: Misty Stanley M.D.  On: 06/07/2020 14:34   CT HEAD WITHOUT CONTRAST    TECHNIQUE:  Contiguous axial images were obtained from the base of the skull  through the vertex without intravenous contrast.    COMPARISON: 06/05/2020    FINDINGS:  Brain: Stable encephalomalacia right frontal lobe from prior  infarct. No signs of acute infarct or hemorrhage. Lateral ventricles  and midline structures are unremarkable. No acute extra-axial fluid  collections. No mass effect.    Vascular: Stable atherosclerosis. No hyperdense vessel.    Skull: Normal. Negative for fracture or focal lesion.    Sinuses/Orbits: Postsurgical and chronic changes of the right  maxillary sinus are stable. Remaining sinuses are clear.    Other: None.    IMPRESSION:  1. Stable head CT, no acute process.      Electronically Signed  By: Randa Ngo M.D.  On: 06/07/2020 18:13     CT CERVICAL SPINE WITHOUT CONTRAST    TECHNIQUE:  Multidetector CT imaging of the cervical spine was performed without  intravenous contrast. Multiplanar CT image reconstructions were also  generated.    COMPARISON: None.    FINDINGS:  Alignment: Alignment is grossly anatomic.    Skull base and vertebrae: Evaluation of the lower cervical spine is  limited due to body habitus and patient motion. No gross bony  abnormalities. No evidence of displaced fracture.    Soft tissues and spinal canal: Heterogeneous enlargement of the  thyroid is again noted, previously evaluated by ultrasound and  biopsy. No prevertebral  soft tissue swelling. No visible canal  hematoma.    Disc levels: There is mild diffuse facet hypertrophy. Disc spaces  appear grossly well preserved. No significant compressive sequelae  on this exam limited by motion and body habitus.    Upper chest: Airway is patent, with deviation of the trachea to the  right from the enlarged left lobe  thyroid. Emphysematous changes are  seen at the lung apices.    Other: Reconstructed images demonstrate no additional findings.    IMPRESSION:  1. Study limited by patient motion and body habitus.  2. No evidence of cervical spine fracture.      Electronically Signed  By: Randa Ngo M.D.  On: 06/07/2020 18:15     MDM  Patient received in signout, please see previous provider note to include MDM up to this point.  Labs: CBC with leukocytosis 14. Hemoglobin consistent with baseline CMP significant for AKI. BUN/CR today is 63/3.17 compared to x2days ago when it was 69/2.51, baseline CR appears to be around 1.5. CK mildly elevated at 317. Ammonia <9. TSH within normal range. UA, UC, UDS not yet collected. In and out cath ordered.   Echo on 12/06/2019 with LVEF 50 to 55%. Will give small fluid bolus for AKI. EKG shows afib with controlled rate. Chest xray Low volume film with cardiomegaly, vascular congestion and interstitial pulmonary edema. CT head without acute findings, no sign of a head bleed. CT cervical spine without fracture or dislocation. On reassessment patient is resting comfortably. No focal weakaness appreciated.  Patient will need admission for AKI, likely caused by dehydration given lack of PO intake x 2 days.   Spoke with Dr. [place] Crane with hospitalist service who agrees to assume care of patient and bring into the hospital for further evaluation and management.     Portions of this note were generated with [employer]. Dictation errors may occur despite best attempts at proofreading.      Barrie Folk, PA-C 06/07/20 1925    Margette Fast, MD 06/08/20 1351

## 2020-06-08 ENCOUNTER — Inpatient Hospital Stay (HOSPITAL_COMMUNITY): Payer: Medicare Other

## 2020-06-08 ENCOUNTER — Other Ambulatory Visit (HOSPITAL_COMMUNITY): Payer: Medicare Other

## 2020-06-08 DIAGNOSIS — N179 Acute kidney failure, unspecified: Principal | ICD-10-CM

## 2020-06-08 DIAGNOSIS — E860 Dehydration: Secondary | ICD-10-CM

## 2020-06-08 LAB — ECHOCARDIOGRAM COMPLETE
S' Lateral: 3.9 cm
Weight: 4385.92 oz

## 2020-06-08 LAB — RAPID URINE DRUG SCREEN, HOSP PERFORMED
Amphetamines: NOT DETECTED
Barbiturates: NOT DETECTED
Benzodiazepines: NOT DETECTED
Cocaine: NOT DETECTED
Opiates: NOT DETECTED
Tetrahydrocannabinol: NOT DETECTED

## 2020-06-08 LAB — CBC WITH DIFFERENTIAL/PLATELET
Abs Immature Granulocytes: 0.07 10*3/uL (ref 0.00–0.07)
Basophils Absolute: 0.1 10*3/uL (ref 0.0–0.1)
Basophils Relative: 1 %
Eosinophils Absolute: 0.3 10*3/uL (ref 0.0–0.5)
Eosinophils Relative: 2 %
HCT: 32.3 % — ABNORMAL LOW (ref 36.0–46.0)
Hemoglobin: 11 g/dL — ABNORMAL LOW (ref 12.0–15.0)
Immature Granulocytes: 1 %
Lymphocytes Relative: 18 %
Lymphs Abs: 2.2 10*3/uL (ref 0.7–4.0)
MCH: 25.8 pg — ABNORMAL LOW (ref 26.0–34.0)
MCHC: 34.1 g/dL (ref 30.0–36.0)
MCV: 75.8 fL — ABNORMAL LOW (ref 80.0–100.0)
Monocytes Absolute: 1.2 10*3/uL — ABNORMAL HIGH (ref 0.1–1.0)
Monocytes Relative: 10 %
Neutro Abs: 8.9 10*3/uL — ABNORMAL HIGH (ref 1.7–7.7)
Neutrophils Relative %: 68 %
Platelets: 157 10*3/uL (ref 150–400)
RBC: 4.26 MIL/uL (ref 3.87–5.11)
RDW: 15.5 % (ref 11.5–15.5)
WBC: 12.8 10*3/uL — ABNORMAL HIGH (ref 4.0–10.5)
nRBC: 0 % (ref 0.0–0.2)

## 2020-06-08 LAB — URINALYSIS, ROUTINE W REFLEX MICROSCOPIC
Bacteria, UA: NONE SEEN
Bilirubin Urine: NEGATIVE
Glucose, UA: NEGATIVE mg/dL
Hgb urine dipstick: NEGATIVE
Ketones, ur: NEGATIVE mg/dL
Leukocytes,Ua: NEGATIVE
Nitrite: NEGATIVE
Protein, ur: 30 mg/dL — AB
Specific Gravity, Urine: 1.012 (ref 1.005–1.030)
pH: 5 (ref 5.0–8.0)

## 2020-06-08 LAB — BASIC METABOLIC PANEL
Anion gap: 12 (ref 5–15)
BUN: 59 mg/dL — ABNORMAL HIGH (ref 8–23)
CO2: 27 mmol/L (ref 22–32)
Calcium: 11.2 mg/dL — ABNORMAL HIGH (ref 8.9–10.3)
Chloride: 103 mmol/L (ref 98–111)
Creatinine, Ser: 2.53 mg/dL — ABNORMAL HIGH (ref 0.44–1.00)
GFR, Estimated: 19 mL/min — ABNORMAL LOW (ref >60)
Glucose, Bld: 200 mg/dL — ABNORMAL HIGH (ref 70–99)
Potassium: 3.7 mmol/L (ref 3.5–5.1)
Sodium: 142 mmol/L (ref 135–145)

## 2020-06-08 LAB — GLUCOSE, CAPILLARY
Glucose-Capillary: 179 mg/dL — ABNORMAL HIGH (ref 70–99)
Glucose-Capillary: 239 mg/dL — ABNORMAL HIGH (ref 70–99)
Glucose-Capillary: 233 mg/dL — ABNORMAL HIGH (ref 70–99)
Glucose-Capillary: 204 mg/dL — ABNORMAL HIGH (ref 70–99)

## 2020-06-08 LAB — MAGNESIUM: Magnesium: 1.9 mg/dL (ref 1.7–2.4)

## 2020-06-08 LAB — HEMOGLOBIN A1C
Hgb A1c MFr Bld: 8.5 % — ABNORMAL HIGH (ref 4.8–5.6)
Mean Plasma Glucose: 197.25 mg/dL

## 2020-06-08 LAB — CK: Total CK: 301 U/L — ABNORMAL HIGH (ref 38–234)

## 2020-06-08 MED ORDER — MONTELUKAST SODIUM 10 MG PO TABS
10.0000 mg | ORAL_TABLET | Freq: Every day | ORAL | Status: DC
  Administered 2020-06-08 – 2020-06-16 (×9): 10 mg via ORAL
  Filled 2020-06-08 (×9): qty 1

## 2020-06-08 MED ORDER — ONDANSETRON HCL 4 MG/2ML IJ SOLN: 4.0000 mg | Freq: Four times a day (QID) | INTRAMUSCULAR | Status: DC | PRN

## 2020-06-08 MED ORDER — GABAPENTIN 100 MG PO CAPS
100.0000 mg | ORAL_CAPSULE | Freq: Every day | ORAL | Status: DC
  Administered 2020-06-09 – 2020-06-16 (×8): 100 mg via ORAL
  Filled 2020-06-08 (×8): qty 1

## 2020-06-08 MED ORDER — ACETAMINOPHEN 325 MG PO TABS
650.0000 mg | ORAL_TABLET | Freq: Four times a day (QID) | ORAL | Status: DC | PRN
  Administered 2020-06-08 – 2020-06-10 (×3): 650 mg via ORAL
  Filled 2020-06-08 (×4): qty 2

## 2020-06-08 MED ORDER — INSULIN GLARGINE 100 UNIT/ML ~~LOC~~ SOLN
10.0000 [IU] | Freq: Every day | SUBCUTANEOUS | Status: DC
  Administered 2020-06-08 – 2020-06-14 (×7): 10 [IU] via SUBCUTANEOUS
  Filled 2020-06-08 (×7): qty 0.1

## 2020-06-08 NOTE — Progress Notes (Signed)
New Admission Note:   Arrival Method: ED via stretcher Mental Orientation: Alert  and oriented to self, disoriented to place, time and situation Telemetry: 32m10, CCMD notified Assessment: to be completed Skin: Intact, refer to flowsheet IV: RAC, SL- infiltrated, will put IV consult Pain: 0/10 Tubes: None Safety Measures: Safety Fall Prevention Plan has been discussed  Admission: to be completed 5 [place] Orientation: Patient/ daughter has been oriented to the room, unit and staff.   Family: daughter at bedside  Orders to be reviewed and implemented. Will continue to monitor the patient. Call light has been placed within reach and bed alarm has been activated.

## 2020-06-08 NOTE — Plan of Care (Signed)
?  Problem: Coping: ?Goal: Level of anxiety will decrease ?Outcome: Progressing ?  ?Problem: Safety: ?Goal: Ability to remain free from injury will improve ?Outcome: Progressing ?  ?

## 2020-06-08 NOTE — Progress Notes (Addendum)
**Note Brittany Roberts** Progress Note    [name]  DXI:[id number] DOB: Jan 07, 1941  DOA: 06/07/2020 PCP: [name], [name], MD    Brief Narrative:     Medical records reviewed and are as summarized below:  Brittany Roberts is an 79 y.o. female with medical history significant for prior CVA, chronic diastolic CHF, coronary artery disease status post PCI, obesity, essential hypertension, type 2 diabetes, peripheral neuropathy, gout, chronic anxiety/depression, seizure disorder, GERD, asthma, memory deficit/not officially diagnosed with dementia, lupus, LV apical thrombus completed 3 months of anticoagulation, COVID-19 viral infection March 2021, who presented to [facility] ED from home due to worsening altered mental status.  History is obtained from [place], her daughter at bedside, and review of medical records.  Patient was seen in the ED few days prior to this presentation for the same, was found to have a UTI and was sent home on Keflex.  Per daughter she has had worsening confusion.  She had a fall today was possibly down for more than an hour.   Assessment/Plan:   Active Problems:   AMS (altered mental status)   Altered mental status/acute metabolic encephalopathy, likely secondary to presumed UTI, POA Seen in the ED 2 days prior with UA positive for pyuria, was sent home on Keflex.  No urine cultures available. Obtain UA and urine culture to narrow down antibiotics Start Rocephin empirically for presumed UTI CT head nonacute Reorient as needed delirium precautions  Chronic respiratory failure -on 3L at home  chronic diastolic CHF Last 2D echo showing LVEF 50 to 55% with grade 1 diastolic dysfunction. -bnp mildly elevated Strict I's and O's and daily weight  AKI on CKD 3B, likely prerenal in the setting of dehydration from poor oral intake Baseline creatinine appears to be 1.6 with GFR of 33 Presented with creatinine of 3.17 with GFR of 14 renal ultrasound: Findings suggestive of medical renal disease.  Bilateral renal cysts are noted measuring up to approximately 7 cm on the right. Significantly distended urinary bladder Avoid nephrotoxins, dehydration and hypotension IVF Closely monitor volume status while on gentle IV fluid in the setting of cardiomyopathy Closely monitor urine output Daily renal panel  Presumed UTI Follow urine cultures for ID and sensitivities -partially treated already with PO abx  Type 2 diabetes with hyperglycemia -hemoglobin A1c: 8.5 Start insulin sliding scale  Ambulatory dysfunction with a fall PT OT to assess Fall precautions  Elevated CPK post fall -per chart review, appears to be chronic  Elevated calcium -? Dehydration -recheck if AM after hydration, if still elevated then will check PTH  Seizure disorder Start IV Keppra 500 mg twice daily to ensure patient will not miss her doses, switch back to oral once patient is compliant in takes adequate oral intake Seizure precautions  Chronic anxiety/depression/GERD/peripheral neuropathy Resume home regimen  History of COVID-19 in March 2021  History of LV apical thrombus Completed 3 months of anticoagulation  Blood products refusal, patient is Jehovah witness No blood transfusion per her daughter at bedside.  Prior history of CVA/coronary artery disease Not on any antiplatelets or statin, unclear if she has any contraindications or intolerances to these. Defer management to her neurologist and cardiologist outpatient  obesity Body mass index is 47.8 kg/m.   Family Communication/Anticipated D/C date and plan/Code Status   DVT prophylaxis: heparin Code Status: dnr Disposition Plan: Status is: Inpatient Spoke with daughter, anita: patient lives alone but another daughter lives across the street.  She has an aide 2 1/2 hours/day.  Family provides meals and gives meds.  Family prefers she goes to SNF.  This AM they feel she is back closer to her baseline Remains inpatient  appropriate because:Inpatient level of care appropriate due to severity of illness   Dispo:  Patient From: Home  Planned Disposition: [place]  Expected discharge date: 06/11/20  Medically stable for discharge: No          Medical Consultants:    None.     Subjective:   Speech difficult to understand but no reported overnight events and no complaints  Objective:    Vitals:   06/07/20 2254 06/07/20 2258 06/08/20 0411 06/08/20 0948  BP:  127/71 (!) 103/47 (!) 101/56  Pulse:  (!) 52 (!) 54 (!) 55  Resp:  16 16 18   Temp:  97.7 F (36.5 C) (!) 97.5 F (36.4 C) 98 F (36.7 C)  TempSrc:  Oral Oral Oral  SpO2: 100% 99% 100% 100%  Weight:  124.3 kg      Intake/Output Summary (Last 24 hours) at 06/08/2020 1003 Last data filed at 06/08/2020 0957 Gross per 24 hour  Intake 998.63 ml  Output 550 ml  Net 448.63 ml   Filed Weights   06/07/20 2258  Weight: 124.3 kg    Exam:  General: Appearance:    Severely obese female in no acute distress     Lungs:     On [place], respirations unlabored  Heart:    Bradycardic. Normal rhythm.   MS:   All extremities are intact.   Neurologic:   Will awaken, speech difficult to understand     Data Reviewed:   I have personally reviewed following labs and imaging studies:  Labs: Labs show the following:   Basic Metabolic Panel: Recent Labs  Lab 06/05/20 1156 06/05/20 1156 06/07/20 1458 06/08/20 0445  NA 139  --  139 142  K 3.6   < > 3.9 3.7  CL 101  --  101 103  CO2 27  --  25 27  GLUCOSE 226*  --  252* 200*  BUN 69*  --  63* 59*  CREATININE 2.51*  --  3.17* 2.53*  CALCIUM 11.2*  --  12.0* 11.2*  MG  --   --   --  1.9   < > = values in this interval not displayed.   GFR Estimated Creatinine Clearance: 22.9 mL/min (A) (by C-G formula based on SCr of 2.53 mg/dL (H)). Liver Function Tests: Recent Labs  Lab 06/05/20 1156 06/07/20 1458  AST 25 38  ALT 18 21  ALKPHOS 105 97  BILITOT 0.8 0.8  PROT  7.7 7.5  ALBUMIN 3.5 3.4*   No results for input(s): LIPASE, AMYLASE in the last 168 hours. Recent Labs  Lab 06/07/20 1520  AMMONIA <9*   Coagulation profile No results for input(s): INR, PROTIME in the last 168 hours.  CBC: Recent Labs  Lab 06/05/20 1156 06/07/20 1458 06/08/20 0634  WBC 13.0* 14.0* 12.8*  NEUTROABS 9.2* 11.0* 8.9*  HGB 10.7* 11.7* 11.0*  HCT 31.5* 34.9* 32.3*  MCV 75.9* 75.7* 75.8*  PLT 180 179 157   Cardiac Enzymes: Recent Labs  Lab 06/07/20 1458 06/08/20 0445  CKTOTAL 317* 301*   BNP (last 3 results) No results for input(s): PROBNP in the last 8760 hours. CBG: Recent Labs  Lab 06/07/20 2250 06/08/20 0648  GLUCAP 188* 179*   D-Dimer: No results for input(s): DDIMER in the last 72 hours. Hgb A1c: Recent Labs  06/07/20 2220  HGBA1C 8.5*   Lipid Profile: No results for input(s): CHOL, HDL, LDLCALC, TRIG, CHOLHDL, LDLDIRECT in the last 72 hours. Thyroid function studies: Recent Labs    06/07/20 1458  TSH 2.934   Anemia work up: No results for input(s): VITAMINB12, FOLATE, FERRITIN, TIBC, IRON, RETICCTPCT in the last 72 hours. Sepsis Labs: Recent Labs  Lab 06/05/20 1156 06/07/20 1458 06/08/20 0634  WBC 13.0* 14.0* 12.8*  LATICACIDVEN 1.2  --   --     Microbiology Recent Results (from the past 240 hour(s))  Resp Panel by RT-PCR (Flu A&B, Covid) Nasopharyngeal Swab     Status: None   Collection Time: 06/07/20  4:20 PM   Specimen: Nasopharyngeal Swab; Nasopharyngeal(NP) swabs in vial transport medium  Result Value Ref Range Status   SARS Coronavirus 2 by RT PCR NEGATIVE NEGATIVE Final    Comment: (NOTE) SARS-CoV-2 target nucleic acids are NOT DETECTED.  The SARS-CoV-2 RNA is generally detectable in upper respiratory specimens during the acute phase of infection. The lowest concentration of SARS-CoV-2 viral copies this assay can detect is 138 copies/mL. A negative result does not preclude SARS-Cov-2 infection and should  not be used as the sole basis for treatment or other patient management decisions. A negative result may occur with  improper specimen collection/handling, submission of specimen other than nasopharyngeal swab, presence of viral mutation(s) within the areas targeted by this assay, and inadequate number of viral copies(<138 copies/mL). A negative result must be combined with clinical observations, patient history, and epidemiological information. The expected result is Negative.  Fact Sheet for Patients:  EntrepreneurPulse.com.au  Fact Sheet for Healthcare Providers:  IncredibleEmployment.be  This test is no t yet approved or cleared by the [place] FDA and  has been authorized for detection and/or diagnosis of SARS-CoV-2 by FDA under an Emergency Use Authorization (EUA). This EUA will remain  in effect (meaning this test can be used) for the duration of the COVID-19 declaration under Section 564(b)(1) of the Act, 21 U.S.C.section 360bbb-3(b)(1), unless the authorization is terminated  or revoked sooner.       Influenza A by PCR NEGATIVE NEGATIVE Final   Influenza B by PCR NEGATIVE NEGATIVE Final    Comment: (NOTE) The Xpert Xpress SARS-CoV-2/FLU/RSV plus assay is intended as an aid in the diagnosis of influenza from Nasopharyngeal swab specimens and should not be used as a sole basis for treatment. Nasal washings and aspirates are unacceptable for Xpert Xpress SARS-CoV-2/FLU/RSV testing.  Fact Sheet for Patients: EntrepreneurPulse.com.au  Fact Sheet for Healthcare Providers: IncredibleEmployment.be  This test is not yet approved or cleared by the [place] FDA and has been authorized for detection and/or diagnosis of SARS-CoV-2 by FDA under an Emergency Use Authorization (EUA). This EUA will remain in effect (meaning this test can be used) for the duration of the COVID-19 declaration under  Section 564(b)(1) of the Act, 21 U.S.C. section 360bbb-3(b)(1), unless the authorization is terminated or revoked.  Performed at [facility], [place] [address] [place], [place] [address]     Procedures and diagnostic studies:  CT Head Wo Contrast  Result Date: 06/07/2020 CLINICAL DATA:  Found down EXAM: CT HEAD WITHOUT CONTRAST TECHNIQUE: Contiguous axial images were obtained from the base of the skull through the vertex without intravenous contrast. COMPARISON:  06/05/2020 FINDINGS: Brain: Stable encephalomalacia right frontal lobe from prior infarct. No signs of acute infarct or hemorrhage. Lateral ventricles and midline structures are unremarkable. No acute extra-axial fluid collections. No mass effect.  Vascular: Stable atherosclerosis.  No hyperdense vessel. Skull: Normal. Negative for fracture or focal lesion. Sinuses/Orbits: Postsurgical and chronic changes of the right maxillary sinus are stable. Remaining sinuses are clear. Other: None. IMPRESSION: 1. Stable head CT, no acute process. Electronically Signed   By: Randa Ngo M.D.   On: 06/07/2020 18:13   CT Cervical Spine Wo Contrast  Result Date: 06/07/2020 CLINICAL DATA:  Found down EXAM: CT CERVICAL SPINE WITHOUT CONTRAST TECHNIQUE: Multidetector CT imaging of the cervical spine was performed without intravenous contrast. Multiplanar CT image reconstructions were also generated. COMPARISON:  None. FINDINGS: Alignment: Alignment is grossly anatomic. Skull base and vertebrae: Evaluation of the lower cervical spine is limited due to body habitus and patient motion. No gross bony abnormalities. No evidence of displaced fracture. Soft tissues and spinal canal: Heterogeneous enlargement of the thyroid is again noted, previously evaluated by ultrasound and biopsy. No prevertebral soft tissue swelling. No visible canal hematoma. Disc levels: There is mild diffuse facet hypertrophy. Disc spaces appear grossly well preserved. No significant  compressive sequelae on this exam limited by motion and body habitus. Upper chest: Airway is patent, with deviation of the trachea to the right from the enlarged left lobe thyroid. Emphysematous changes are seen at the lung apices. Other: Reconstructed images demonstrate no additional findings. IMPRESSION: 1. Study limited by patient motion and body habitus. 2. No evidence of cervical spine fracture. Electronically Signed   By: Randa Ngo M.D.   On: 06/07/2020 18:15   US RENAL  Result Date: 06/07/2020 CLINICAL DATA:  Acute kidney injury EXAM: RENAL / URINARY TRACT ULTRASOUND COMPLETE COMPARISON:  December 05, 2019 FINDINGS: Right Kidney: Renal measurements: 8.7 x 4.3 x 5 cm = volume: 99 mL. There is no hydronephrosis. There is cortical thinning and increased cortical echogenicity. Large renal cysts are noted measuring up to approximately 7 cm. Left Kidney: Renal measurements: 10 x 5 x 5 cm = volume: 130 mL. There is increased cortical echogenicity without evidence for hydronephrosis. There is cortical thinning. A simple appearing 1.5 cm cyst is noted. Bladder: The urinary bladder is significantly distended. Other: None. IMPRESSION: 1. No hydronephrosis. 2. Findings suggestive of medical renal disease. 3. Bilateral renal cysts are noted measuring up to approximately 7 cm on the right. 4. Significantly distended urinary bladder. Electronically Signed   By: Constance Holster M.D.   On: 06/07/2020 21:31   DG Chest Portable 1 View  Result Date: 06/07/2020 CLINICAL DATA:  Altered mental status. EXAM: PORTABLE CHEST 1 VIEW COMPARISON:  06/05/2020 FINDINGS: 1424 hours. Low lung volumes. The cardio pericardial silhouette is enlarged. There is pulmonary vascular congestion without overt pulmonary edema. Diffuse interstitial opacities suggest edema. No substantial pleural effusion. Bones are diffusely demineralized. IMPRESSION: Low volume film with cardiomegaly, vascular congestion and interstitial pulmonary edema.  Electronically Signed   By: Misty Stanley M.D.   On: 06/07/2020 14:34    Medications:   . allopurinol  200 mg Oral Daily  . atorvastatin  20 mg Oral Daily  . cholecalciferol  5,000 Units Oral Daily  . DULoxetine  60 mg Oral Daily  . ferrous sulfate  325 mg Oral BID WC  . gabapentin  100 mg Oral BID  . heparin injection (subcutaneous)  5,000 Units Subcutaneous Q8H  . insulin aspart  0-5 Units Subcutaneous QHS  . insulin aspart  0-9 Units Subcutaneous TID WC  . mometasone-formoterol  2 puff Inhalation BID  . montelukast  10 mg Oral QHS  . pantoprazole  40 mg  Oral Daily   Continuous Infusions: . cefTRIAXone (ROCEPHIN)  IV Stopped (06/08/20 0128)  . lactated ringers 50 mL/hr at 06/08/20 0806  . levETIRAcetam 500 mg (06/08/20 0944)     LOS: 1 day   Geradine Girt  [facility]   How to contact the [facility] Attending or Consulting provider [place] or covering provider during after hours [place], for this patient?  1. Check the care team in [facility] and look for a) attending/consulting TR[facility] provider listed and b) the [facility] team listed 2. Log into [email or web address] and use [place]'s universal password to access. If you do not have the password, please contact the hospital operator. 3. Locate the [facility] provider you are looking for under [facility] and page to a number that you can be directly reached. 4. If you still have difficulty reaching the provider, please page the [facility] (Director on Call) for the Hospitalists listed on amion for assistance.  06/08/2020, 10:03 AM

## 2020-06-08 NOTE — Evaluation (Signed)
Physical Therapy Evaluation Patient Details Name: Brittany Roberts MRN: [id number] DOB: Jun 06, 1941 Today's Date: 06/08/2020   History of Present Illness  Brittany Roberts is a 79 y.o. female with medical history significant for prior CVA, chronic diastolic CHF, coronary artery disease status post PCI, obesity, essential hypertension, type 2 diabetes, peripheral neuropathy, gout, chronic anxiety/depression, seizure disorder, GERD, asthma, memory deficit/not officially diagnosed with dementia, lupus, LV apical thrombus completed 3 months of anticoagulation, COVID-19 viral infection March 2021, who presented to [facility] ED from home due to worsening altered mental status.  History is obtained from [place], her daughter at bedside, and review of medical records.  Patient was seen in the ED few days prior to this presentation for the same, was found to have a UTI and was sent home on Keflex.  Per daughter she has had worsening confusion.  She had a fall today was possibly down for more than an hour.  CT head and cervical spine nonacute.  Chest x-ray consistent with pulmonary edema.  Work-up in the ED revealed AKI.  Clinical Impression   Pt admitted with above diagnosis. Comes from home where she lives alone in an apartment; One daughter lives very close by and she has an Aide who comes to help with ADLs and home management; Likes to make crafts; Presentst to PT with dependencies in functional mobility, generalized weakness, poor endurance;  Pt currently with functional limitations due to the deficits listed below (see PT Problem List). Pt will benefit from skilled PT to increase their independence and safety with mobility to allow discharge to the venue listed below.       Follow Up Recommendations SNF    Equipment Recommendations  Rolling walker with 5" wheels;3in1 (PT) ([place] -- may consider Insurance claims handler)    Recommendations for Other Services       Precautions / Restrictions Precautions Precautions: Fall       Mobility  Bed Mobility Overal bed mobility: Needs Assistance Bed Mobility: Supine to Sit     Supine to sit: Mod assist     General bed mobility comments: Cues to initiate and for techqnieu; Light Mod assist and use of bed pad to square off hips at EOB    Transfers Overall transfer level: Needs assistance Equipment used: Rolling walker (2 wheeled) Transfers: Sit to/from [facility] Sit to Stand: Min assist;+2 safety/equipment Stand pivot transfers: Min assist;+2 safety/equipment       General transfer comment: Stood from EOB with hands on RW, pulling up -- will need reiforcement od safe hand placement; Pivotal steps bed to recliner with RW  Ambulation/Gait                Stairs            Wheelchair Mobility    Modified Rankin (Stroke Patients Only)       Balance Overall balance assessment: Needs assistance Sitting-balance support: Bilateral upper extremity supported;Feet supported Sitting balance-Leahy Scale: Fair       Standing balance-Leahy Scale: Poor                               Pertinent Vitals/Pain Pain Assessment: Faces Faces Pain Scale: No hurt Pain Intervention(s): Monitored during session    Home Living Family/patient expects to be discharged to:: Private residence Living Arrangements: Alone Available Help at Discharge: Personal care attendant;Available PRN/intermittently (Mon - Fri 2 1/2 to 3 hours a day) Type of Home: Apartment  Home Access: Level entry     Home Layout: One level Home Equipment: [employer] - 4 wheels;Shower seat (on 3 L supplemental O2 consistently) Additional Comments: Pt lives alone - states her dtr lives across the street    Prior Function Level of Independence: Needs assistance   Gait / Transfers Assistance Needed: uses RW  ADL's / Homemaking Assistance Needed: Aide assists with cooking, cleaning and ADLs        Hand Dominance   Dominant Hand: Right    Extremity/Trunk  Assessment   Upper Extremity Assessment Upper Extremity Assessment: Generalized weakness    Lower Extremity Assessment Lower Extremity Assessment: Generalized weakness    Cervical / Trunk Assessment Cervical / Trunk Assessment: Other exceptions Cervical / Trunk Exceptions: incr body habitus  Communication   Communication: HOH  Cognition Arousal/Alertness: Awake/alert Behavior During Therapy: WFL for tasks assessed/performed Overall Cognitive Status: Impaired/Different from baseline Area of Impairment: Following commands;Problem solving                       Following Commands: Follows one step commands with increased time     Problem Solving: Decreased initiation;Slow processing General Comments: Slow to answer questions      General Comments      Exercises     Assessment/Plan    PT Assessment Patient needs continued PT services  PT Problem List Decreased strength;Decreased range of motion;Decreased activity tolerance;Decreased balance;Decreased mobility;Decreased cognition;Decreased knowledge of use of DME;Decreased safety awareness;Decreased knowledge of precautions;Obesity       PT Treatment Interventions DME instruction;Gait training;Functional mobility training;Therapeutic activities;Therapeutic exercise;Balance training;Neuromuscular re-education;Cognitive remediation;Patient/family education    PT Goals (Current goals can be found in the Care Plan section)  Acute Rehab PT Goals Patient Stated Goal: Agreeable to getting OOB PT Goal Formulation: With patient Time For Goal Achievement: 06/22/20 Potential to Achieve Goals: Good    Frequency Min 2X/week   Barriers to discharge        Co-evaluation               AM-PAC PT "6 Clicks" Mobility  Outcome Measure Help needed turning from your back to your side while in a flat bed without using bedrails?: A Little Help needed moving from lying on your back to sitting on the side of a flat bed  without using bedrails?: A Lot Help needed moving to and from a bed to a chair (including a wheelchair)?: A Little Help needed standing up from a chair using your arms (e.g., wheelchair or bedside chair)?: A Little Help needed to walk in hospital room?: A Lot Help needed climbing 3-5 steps with a railing? : Total 6 Click Score: 14    End of Session   Activity Tolerance: Patient tolerated treatment well Patient left: in chair;with call bell/phone within reach;with chair alarm set;with family/visitor present Nurse Communication: Mobility status PT Visit Diagnosis: Unsteadiness on feet (R26.81);Other abnormalities of gait and mobility (R26.89);Muscle weakness (generalized) (M62.81)    Time: 1106-1130 PT Time Calculation (min) (ACUTE ONLY): 24 min   Charges:   PT Evaluation $PT Eval Moderate Complexity: 1 Mod PT Treatments $Therapeutic Activity: 8-22 mins        Roney Marion, PT  Acute Rehabilitation Services Pager [phone] Office [place] 06/08/2020, 2:04 PM

## 2020-06-09 LAB — URINE CULTURE: Culture: NO GROWTH

## 2020-06-09 LAB — CBC WITH DIFFERENTIAL/PLATELET
Abs Immature Granulocytes: 0.04 10*3/uL (ref 0.00–0.07)
Basophils Absolute: 0.1 10*3/uL (ref 0.0–0.1)
Basophils Relative: 1 %
Eosinophils Absolute: 0.4 10*3/uL (ref 0.0–0.5)
Eosinophils Relative: 4 %
HCT: 30.8 % — ABNORMAL LOW (ref 36.0–46.0)
Hemoglobin: 10.4 g/dL — ABNORMAL LOW (ref 12.0–15.0)
Immature Granulocytes: 0 %
Lymphocytes Relative: 24 %
Lymphs Abs: 2.4 10*3/uL (ref 0.7–4.0)
MCH: 25.6 pg — ABNORMAL LOW (ref 26.0–34.0)
MCHC: 33.8 g/dL (ref 30.0–36.0)
MCV: 75.7 fL — ABNORMAL LOW (ref 80.0–100.0)
Monocytes Absolute: 0.9 10*3/uL (ref 0.1–1.0)
Monocytes Relative: 9 %
Neutro Abs: 6 10*3/uL (ref 1.7–7.7)
Neutrophils Relative %: 62 %
Platelets: 171 10*3/uL (ref 150–400)
RBC: 4.07 MIL/uL (ref 3.87–5.11)
RDW: 15.3 % (ref 11.5–15.5)
WBC: 9.7 10*3/uL (ref 4.0–10.5)
nRBC: 0 % (ref 0.0–0.2)

## 2020-06-09 LAB — BASIC METABOLIC PANEL
Anion gap: 10 (ref 5–15)
BUN: 54 mg/dL — ABNORMAL HIGH (ref 8–23)
CO2: 25 mmol/L (ref 22–32)
Calcium: 11.4 mg/dL — ABNORMAL HIGH (ref 8.9–10.3)
Chloride: 105 mmol/L (ref 98–111)
Creatinine, Ser: 2.48 mg/dL — ABNORMAL HIGH (ref 0.44–1.00)
GFR, Estimated: 19 mL/min — ABNORMAL LOW (ref >60)
Glucose, Bld: 152 mg/dL — ABNORMAL HIGH (ref 70–99)
Potassium: 3.9 mmol/L (ref 3.5–5.1)
Sodium: 140 mmol/L (ref 135–145)

## 2020-06-09 LAB — GLUCOSE, CAPILLARY
Glucose-Capillary: 179 mg/dL — ABNORMAL HIGH (ref 70–99)
Glucose-Capillary: 204 mg/dL — ABNORMAL HIGH (ref 70–99)
Glucose-Capillary: 155 mg/dL — ABNORMAL HIGH (ref 70–99)
Glucose-Capillary: 158 mg/dL — ABNORMAL HIGH (ref 70–99)

## 2020-06-09 MED ORDER — CARVEDILOL 6.25 MG PO TABS
6.2500 mg | ORAL_TABLET | Freq: Two times a day (BID) | ORAL | Status: DC
  Administered 2020-06-09: 6.25 mg via ORAL
  Filled 2020-06-09 (×3): qty 1

## 2020-06-09 MED ORDER — LEVETIRACETAM 500 MG PO TABS
500.0000 mg | ORAL_TABLET | Freq: Two times a day (BID) | ORAL | Status: DC
  Administered 2020-06-09 – 2020-06-17 (×17): 500 mg via ORAL
  Filled 2020-06-09 (×17): qty 1

## 2020-06-09 MED ORDER — APIXABAN 2.5 MG PO TABS
2.5000 mg | ORAL_TABLET | Freq: Two times a day (BID) | ORAL | Status: DC
  Administered 2020-06-09 – 2020-06-17 (×17): 2.5 mg via ORAL
  Filled 2020-06-09 (×17): qty 1

## 2020-06-09 MED ORDER — MEMANTINE HCL 10 MG PO TABS
10.0000 mg | ORAL_TABLET | Freq: Every day | ORAL | Status: DC
  Administered 2020-06-09 – 2020-06-10 (×2): 10 mg via ORAL
  Filled 2020-06-09 (×2): qty 1

## 2020-06-09 MED ORDER — SODIUM CHLORIDE 0.9 % IV SOLN: INTRAVENOUS | Status: DC

## 2020-06-09 NOTE — Evaluation (Deleted)
Occupational Therapy Evaluation Patient Details Name: Brittany Roberts MRN: [id number] DOB: Sep 17, 1940 Today's Date: 06/09/2020    History of Present Illness Brittany Roberts is a 79 y.o. female with medical history significant for prior CVA, chronic diastolic CHF, coronary artery disease status post PCI, obesity, essential hypertension, type 2 diabetes, peripheral neuropathy, gout, chronic anxiety/depression, seizure disorder, GERD, asthma, memory deficit/not officially diagnosed with dementia, lupus, LV apical thrombus completed 3 months of anticoagulation, COVID-19 viral infection March 2021, who presented to [facility] ED from home due to worsening altered mental status.  History is obtained from [name], her daughter at bedside, and review of medical records.  Patient was seen in the ED few days prior to this presentation for the same, was found to have a UTI and was sent home on Keflex.  Per daughter she has had worsening confusion.  She had a fall today was possibly down for more than an hour.  CT head and cervical spine nonacute.  Chest x-ray consistent with pulmonary edema.  Work-up in the ED revealed AKI.   Clinical Impression   Pt presents with decline in function and safety with ADLs and ADL mobility with impaired strength, balance, endurance and cognition; pt also HOH, Pt with functional deficits listed below and would benefit from acute OT services to address impairments to maximize level of function and safety    Follow Up Recommendations  SNF;Supervision/Assistance - 24 hour    Equipment Recommendations  Other (comment) (TBD at next venue of care)    Recommendations for Other Services       Precautions / Restrictions Precautions Precautions: Fall Restrictions Weight Bearing Restrictions: No      Mobility Bed Mobility Overal bed mobility: Needs Assistance Bed Mobility: Supine to Sit     Supine to sit: Mod assist;Min assist     General bed mobility comments: cues to initiate LEs to EOB,  min A to elevate trunk and mod A with LEs back onto bed    Transfers Overall transfer level: Needs assistance Equipment used: Rolling walker (2 wheeled) Transfers: Sit to/from [facility] Sit to Stand: Mod assist Stand pivot transfers: Mod assist       General transfer comment: Stood from EOB with hands on RW, pulling up -- will need reiforcement safe hand placement; Pivotal steps bed to recliner with RW, however pt refused to continue and steppiong back towards bed    Balance Overall balance assessment: Needs assistance Sitting-balance support: Bilateral upper extremity supported;Feet supported Sitting balance-Leahy Scale: Fair     Standing balance support: Bilateral upper extremity supported Standing balance-Leahy Scale: Poor                             ADL either performed or assessed with clinical judgement   ADL Overall ADL's : Needs assistance/impaired Eating/Feeding: Independent   Grooming: Wash/dry hands;Wash/dry face;Min guard;Sitting   Upper Body Bathing: Min guard;Sitting   Lower Body Bathing: Maximal assistance   Upper Body Dressing : Min guard;Sitting   Lower Body Dressing: Total assistance   Toilet Transfer: Moderate assistance;RW;Ambulation Toilet Transfer Details (indicate cue type and reason): simulated Toileting- Clothing Manipulation and Hygiene: Maximal assistance;Sit to/from stand       Functional mobility during ADLs: Rolling walker;Cueing for safety;Moderate assistance General ADL Comments: pt requires max - total A with LB selfcare at baseline     Vision Baseline Vision/History: Wears glasses Patient Visual Report: No change from baseline  Perception     Praxis      Pertinent Vitals/Pain Pain Assessment: No/denies pain Faces Pain Scale: No hurt     Hand Dominance Right   Extremity/Trunk Assessment Upper Extremity Assessment Upper Extremity Assessment: Generalized weakness;RUE  deficits/detail RUE Deficits / Details: impaired shoulder flexio 0-90 due to OA per pt's daughter   Lower Extremity Assessment Lower Extremity Assessment: Defer to PT evaluation   Cervical / Trunk Assessment Cervical / Trunk Assessment: Other exceptions Cervical / Trunk Exceptions: incr body habitus   Communication Communication Communication: HOH   Cognition Arousal/Alertness: Awake/alert Behavior During Therapy: WFL for tasks assessed/performed Overall Cognitive Status: Impaired/Different from baseline Area of Impairment: Following commands;Problem solving                       Following Commands: Follows one step commands with increased time     Problem Solving: Decreased initiation;Slow processing General Comments: Slow to answer questions   General Comments       Exercises     Shoulder Instructions      Home Living Family/patient expects to be discharged to:: Private residence Living Arrangements: Alone Available Help at Discharge: Personal care attendant;Available PRN/intermittently;Other (Comment) (PCA 2-3 hrs/day, 5 days/wk assists with bathing, dressing and home mgt per pt's daughter) Type of Home: Apartment Home Access: Level entry     Home Layout: One level     Bathroom Shower/Tub: Occupational psychologist: Handicapped height Bathroom Accessibility: Yes   Home Equipment: Environmental consultant - 4 wheels;Shower seat   Additional Comments: Pt lives alone -  dtr lives across the street      Prior Functioning/Environment    Gait / Transfers Assistance Needed: uses RW ADL's / Homemaking Assistance Needed: Aide assists with cooking, cleaning and bathing and LB ADLs            OT Problem List: Decreased strength;Decreased activity tolerance;Decreased coordination;Impaired balance (sitting and/or standing);Decreased knowledge of use of DME or AE;Pain;Obesity      OT Treatment/Interventions: Self-care/ADL training;Therapeutic exercise;DME and/or  AE instruction;Therapeutic activities    OT Goals(Current goals can be found in the care plan section) Acute Rehab OT Goals Patient Stated Goal: get rehab and go home OT Goal Formulation: With patient/family Time For Goal Achievement: 06/23/20 Potential to Achieve Goals: Good ADL Goals Pt Will Perform Grooming: with min guard assist;with supervision;with set-up;standing Pt Will Perform Upper Body Bathing: with supervision;with set-up;sitting Pt Will Perform Upper Body Dressing: with supervision;with set-up;sitting Pt Will Transfer to Toilet: with min assist;with min guard assist;ambulating;bedside commode;stand pivot transfer  OT Frequency: Min 2X/week   Barriers to D/C:            Co-evaluation              AM-PAC OT "6 Clicks" Daily Activity     Outcome Measure Help from another person eating meals?: None Help from another person taking care of personal grooming?: A Little Help from another person toileting, which includes using toliet, bedpan, or urinal?: Total Help from another person bathing (including washing, rinsing, drying)?: A Lot Help from another person to put on and taking off regular upper body clothing?: A Little Help from another person to put on and taking off regular lower body clothing?: Total 6 Click Score: 14   End of Session Equipment Utilized During Treatment: Gait belt;Rolling walker Nurse Communication: Mobility status  Activity Tolerance: Patient limited by fatigue Patient left: in bed;with call bell/phone within reach;with bed alarm set;with family/visitor  present  OT Visit Diagnosis: Unsteadiness on feet (R26.81);Other abnormalities of gait and mobility (R26.89);Other symptoms and signs involving cognitive function;Muscle weakness (generalized) (M62.81)                Time:  -    Charges:       Britt Bottom 06/09/2020, 4:07 PM

## 2020-06-09 NOTE — Progress Notes (Addendum)
Progress Note    Brittany Roberts  MVH:[id number] DOB: 05-02-1941  DOA: 06/07/2020 PCP: [name], [name], MD    Brief Narrative:     Medical records reviewed and are as summarized below:  Brittany Roberts is an 79 y.o. female with medical history significant for prior CVA, chronic diastolic CHF, coronary artery disease status post PCI, obesity, essential hypertension, type 2 diabetes, peripheral neuropathy, gout, chronic anxiety/depression, seizure disorder, GERD, asthma, memory deficit/not officially diagnosed with dementia, lupus, LV apical thrombus completed 3 months of anticoagulation, COVID-19 viral infection March 2021, who presented to [facility] ED from home due to worsening altered mental status.  History is obtained from [place], her daughter at bedside, and review of medical records.  Patient was seen in the ED few days prior to this presentation for the same, was found to have a UTI and was sent home on Keflex.  Per daughter she has had worsening confusion.  She had a fall today was possibly down for more than an hour.   Assessment/Plan:   Active Problems:   AMS (altered mental status)   Altered mental status/acute metabolic encephalopathy, likely secondary to presumed UTI, POA Seen in the ED 2 days prior with UA positive for pyuria, was sent home on Keflex.  No urine cultures available. Obtain UA and urine culture to narrow down antibiotics Start Rocephin empirically for presumed UTI CT head nonacute Reorient as needed delirium precautions  Atrial fib (parox) -appears new -did have recent apical thrombus and was treated with warfarin by Dr. Robb Matar -start eliquis -start coreg -echo done -TSH: normal -per sleep study in 2020 does not have OSA  Chronic respiratory failure -on 3L at home per ER note on 12/2?  Patient denies being on home O2 (chart review from PCP shows 2L O2 since 2016)  chronic diastolic CHF Last 2D echo showing LVEF 50 to 55% with grade 1 diastolic  dysfunction. -bnp mildly elevated Strict I's and O's and daily weight- weight NOT DONE on 12/6 and I highly doubt that the weight change from 12/4 to 12/5 is accurate  AKI on CKD 3B, likely prerenal in the setting of dehydration from poor oral intake Baseline creatinine appears to be 1.6 with GFR of 33 Presented with creatinine of 3.17 with GFR of 14 renal ultrasound: Findings suggestive of medical renal disease. Bilateral renal cysts are noted measuring up to approximately 7 cm on the right. Significantly distended urinary bladder Avoid nephrotoxins, dehydration and hypotension IVF for 34 more hours Closely monitor volume status while on gentle IV fluid in the setting of cardiomyopathy Closely monitor urine output Daily renal panel  Presumed UTI -plan for 3 days of IV abx -partially treated already with PO abx  Type 2 diabetes with hyperglycemia -hemoglobin A1c: 8.5 -insulin sliding scale  Ambulatory dysfunction with a fall -PT recommends SNF Fall precautions  Elevated CPK post fall -per chart review, appears to be chronic  Elevated calcium -? Dehydration vs another etiology -check iPTH and vit D levels  Seizure disorder -oral keppra as she is eating well Seizure precautions  Chronic anxiety/depression/GERD/peripheral neuropathy Resume home regimen  History of COVID-19 in March 2021  History of LV apical thrombus Completed 3 months of anticoagulation  Blood products refusal, patient is Jehovah witness No blood products  Prior history of CVA/coronary artery disease Not on any antiplatelets or statin, unclear if she has any contraindications or intolerances to these. Defer management to her neurologist and cardiologist outpatient  obesity Estimated body  mass index is 50.74 kg/m as calculated from the following:   Height as of 05/22/20: 5' 3.5" (1.613 m).   Weight as of this encounter: 132 kg.   Family Communication/Anticipated D/C date and  plan/Code Status   DVT prophylaxis: heparin Code Status: dnr Disposition Plan: Status is: Inpatient Spoke with daughter, [name] on 12/5: patient lives alone but another daughter lives across the street.  She has an aide 2 1/2 hours/day.  Family provides meals and gives meds.  Family prefers she goes to SNF.  This AM they feel she is back closer to her baseline Remains inpatient appropriate because:Inpatient level of care appropriate due to severity of illness   Dispo:  Patient From: Home  Planned Disposition: [place]  Expected discharge date: 06/11/20  Medically stable for discharge: No          Medical Consultants:    None.     Subjective:   Eating breakfast this AM with no complaints  Objective:    Vitals:   06/08/20 1926 06/08/20 2014 06/09/20 0426 06/09/20 0820  BP:  (!) 121/58 118/61   Pulse:  (!) 55 (!) 55   Resp:  18 20   Temp:  97.6 F (36.4 C) 97.9 F (36.6 C)   TempSrc:  Oral Oral   SpO2: 100% 100% 100% 98%  Weight:  132 kg      Intake/Output Summary (Last 24 hours) at 06/09/2020 0954 Last data filed at 06/09/2020 0452 Gross per 24 hour  Intake 1817.24 ml  Output 1350 ml  Net 467.24 ml   Filed Weights   06/07/20 2258 06/08/20 2014  Weight: 124.3 kg 132 kg    Exam:   General: Appearance:    Severely obese female in no acute distress     Lungs:     On [place], respirations unlabored  Heart:    Bradycardic. Normal rhythm.   MS:   All extremities are intact. Min LE edema  Neurologic:   Awake, alert, pleasant and cooperative     Data Reviewed:   I have personally reviewed following labs and imaging studies:  Labs: Labs show the following:   Basic Metabolic Panel: Recent Labs  Lab 06/05/20 1156 06/05/20 1156 06/07/20 1458 06/07/20 1458 06/08/20 0445 06/09/20 0228  NA 139  --  139  --  142 140  K 3.6   < > 3.9   < > 3.7 3.9  CL 101  --  101  --  103 105  CO2 27  --  25  --  27 25  GLUCOSE 226*  --  252*  --   200* 152*  BUN 69*  --  63*  --  59* 54*  CREATININE 2.51*  --  3.17*  --  2.53* 2.48*  CALCIUM 11.2*  --  12.0*  --  11.2* 11.4*  MG  --   --   --   --  1.9  --    < > = values in this interval not displayed.   GFR Estimated Creatinine Clearance: 24.3 mL/min (A) (by C-G formula based on SCr of 2.48 mg/dL (H)). Liver Function Tests: Recent Labs  Lab 06/05/20 1156 06/07/20 1458  AST 25 38  ALT 18 21  ALKPHOS 105 97  BILITOT 0.8 0.8  PROT 7.7 7.5  ALBUMIN 3.5 3.4*   No results for input(s): LIPASE, AMYLASE in the last 168 hours. Recent Labs  Lab 06/07/20 1520  AMMONIA <9*   Coagulation profile No results for  input(s): INR, PROTIME in the last 168 hours.  CBC: Recent Labs  Lab 06/05/20 1156 06/07/20 1458 06/08/20 0634 06/09/20 0228  WBC 13.0* 14.0* 12.8* 9.7  NEUTROABS 9.2* 11.0* 8.9* 6.0  HGB 10.7* 11.7* 11.0* 10.4*  HCT 31.5* 34.9* 32.3* 30.8*  MCV 75.9* 75.7* 75.8* 75.7*  PLT 180 179 157 171   Cardiac Enzymes: Recent Labs  Lab 06/07/20 1458 06/08/20 0445  CKTOTAL 317* 301*   BNP (last 3 results) No results for input(s): PROBNP in the last 8760 hours. CBG: Recent Labs  Lab 06/08/20 0648 06/08/20 1134 06/08/20 1620 06/08/20 2019 06/09/20 0700  GLUCAP 179* 239* 233* 204* 179*   D-Dimer: No results for input(s): DDIMER in the last 72 hours. Hgb A1c: Recent Labs    06/07/20 2220  HGBA1C 8.5*   Lipid Profile: No results for input(s): CHOL, HDL, LDLCALC, TRIG, CHOLHDL, LDLDIRECT in the last 72 hours. Thyroid function studies: Recent Labs    06/07/20 1458  TSH 2.934   Anemia work up: No results for input(s): VITAMINB12, FOLATE, FERRITIN, TIBC, IRON, RETICCTPCT in the last 72 hours. Sepsis Labs: Recent Labs  Lab 06/05/20 1156 06/07/20 1458 06/08/20 0634 06/09/20 0228  WBC 13.0* 14.0* 12.8* 9.7  LATICACIDVEN 1.2  --   --   --     Microbiology Recent Results (from the past 240 hour(s))  Resp Panel by RT-PCR (Flu A&B, Covid)  Nasopharyngeal Swab     Status: None   Collection Time: 06/07/20  4:20 PM   Specimen: Nasopharyngeal Swab; Nasopharyngeal(NP) swabs in vial transport medium  Result Value Ref Range Status   SARS Coronavirus 2 by RT PCR NEGATIVE NEGATIVE Final    Comment: (NOTE) SARS-CoV-2 target nucleic acids are NOT DETECTED.  The SARS-CoV-2 RNA is generally detectable in upper respiratory specimens during the acute phase of infection. The lowest concentration of SARS-CoV-2 viral copies this assay can detect is 138 copies/mL. A negative result does not preclude SARS-Cov-2 infection and should not be used as the sole basis for treatment or other patient management decisions. A negative result may occur with  improper specimen collection/handling, submission of specimen other than nasopharyngeal swab, presence of viral mutation(s) within the areas targeted by this assay, and inadequate number of viral copies(<138 copies/mL). A negative result must be combined with clinical observations, patient history, and epidemiological information. The expected result is Negative.  Fact Sheet for Patients:  EntrepreneurPulse.com.au  Fact Sheet for Healthcare Providers:  IncredibleEmployment.be  This test is no t yet approved or cleared by the [place] FDA and  has been authorized for detection and/or diagnosis of SARS-CoV-2 by FDA under an Emergency Use Authorization (EUA). This EUA will remain  in effect (meaning this test can be used) for the duration of the COVID-19 declaration under Section 564(b)(1) of the Act, 21 U.S.C.section 360bbb-3(b)(1), unless the authorization is terminated  or revoked sooner.       Influenza A by PCR NEGATIVE NEGATIVE Final   Influenza B by PCR NEGATIVE NEGATIVE Final    Comment: (NOTE) The Xpert Xpress SARS-CoV-2/FLU/RSV plus assay is intended as an aid in the diagnosis of influenza from Nasopharyngeal swab specimens and should not be  used as a sole basis for treatment. Nasal washings and aspirates are unacceptable for Xpert Xpress SARS-CoV-2/FLU/RSV testing.  Fact Sheet for Patients: EntrepreneurPulse.com.au  Fact Sheet for Healthcare Providers: IncredibleEmployment.be  This test is not yet approved or cleared by the [place] FDA and has been authorized for detection and/or diagnosis of SARS-CoV-2  by FDA under an Emergency Use Authorization (EUA). This EUA will remain in effect (meaning this test can be used) for the duration of the COVID-19 declaration under Section 564(b)(1) of the Act, 21 U.S.C. section 360bbb-3(b)(1), unless the authorization is terminated or revoked.  Performed at [facility], [place] [address], [place], [place] [address]   Urine culture     Status: None   Collection Time: 06/07/20  6:16 PM   Specimen: Urine, Random  Result Value Ref Range Status   Specimen Description URINE, RANDOM  Final   Special Requests NONE  Final   Culture   Final    NO GROWTH Performed at [facility], [place] [address] [place], [place] [address]    Report Status 06/09/2020 FINAL  Final  Culture, blood (routine x 2)     Status: None (Preliminary result)   Collection Time: 06/08/20  4:46 AM   Specimen: BLOOD LEFT HAND  Result Value Ref Range Status   Specimen Description BLOOD LEFT HAND  Final   Special Requests   Final    BOTTLES DRAWN AEROBIC ONLY Blood Culture results may not be optimal due to an inadequate volume of blood received in culture bottles   Culture   Final    NO GROWTH 1 DAY Performed at [facility], [place] [address], [place], [place] [address]    Report Status PENDING  Incomplete    Procedures and diagnostic studies:  CT Head Wo Contrast  Result Date: 06/07/2020 CLINICAL DATA:  Found down EXAM: CT HEAD WITHOUT CONTRAST TECHNIQUE: Contiguous axial images were obtained from the base of the skull through the vertex without intravenous  contrast. COMPARISON:  06/05/2020 FINDINGS: Brain: Stable encephalomalacia right frontal lobe from prior infarct. No signs of acute infarct or hemorrhage. Lateral ventricles and midline structures are unremarkable. No acute extra-axial fluid collections. No mass effect. Vascular: Stable atherosclerosis.  No hyperdense vessel. Skull: Normal. Negative for fracture or focal lesion. Sinuses/Orbits: Postsurgical and chronic changes of the right maxillary sinus are stable. Remaining sinuses are clear. Other: None. IMPRESSION: 1. Stable head CT, no acute process. Electronically Signed   By: Randa Ngo M.D.   On: 06/07/2020 18:13   CT Cervical Spine Wo Contrast  Result Date: 06/07/2020 CLINICAL DATA:  Found down EXAM: CT CERVICAL SPINE WITHOUT CONTRAST TECHNIQUE: Multidetector CT imaging of the cervical spine was performed without intravenous contrast. Multiplanar CT image reconstructions were also generated. COMPARISON:  None. FINDINGS: Alignment: Alignment is grossly anatomic. Skull base and vertebrae: Evaluation of the lower cervical spine is limited due to body habitus and patient motion. No gross bony abnormalities. No evidence of displaced fracture. Soft tissues and spinal canal: Heterogeneous enlargement of the thyroid is again noted, previously evaluated by ultrasound and biopsy. No prevertebral soft tissue swelling. No visible canal hematoma. Disc levels: There is mild diffuse facet hypertrophy. Disc spaces appear grossly well preserved. No significant compressive sequelae on this exam limited by motion and body habitus. Upper chest: Airway is patent, with deviation of the trachea to the right from the enlarged left lobe thyroid. Emphysematous changes are seen at the lung apices. Other: Reconstructed images demonstrate no additional findings. IMPRESSION: 1. Study limited by patient motion and body habitus. 2. No evidence of cervical spine fracture. Electronically Signed   By: Randa Ngo M.D.   On:  06/07/2020 18:15   US RENAL  Result Date: 06/07/2020 CLINICAL DATA:  Acute kidney injury EXAM: RENAL / URINARY TRACT ULTRASOUND COMPLETE  COMPARISON:  December 05, 2019 FINDINGS: Right Kidney: Renal measurements: 8.7 x 4.3 x 5 cm = volume: 99 mL. There is no hydronephrosis. There is cortical thinning and increased cortical echogenicity. Large renal cysts are noted measuring up to approximately 7 cm. Left Kidney: Renal measurements: 10 x 5 x 5 cm = volume: 130 mL. There is increased cortical echogenicity without evidence for hydronephrosis. There is cortical thinning. A simple appearing 1.5 cm cyst is noted. Bladder: The urinary bladder is significantly distended. Other: None. IMPRESSION: 1. No hydronephrosis. 2. Findings suggestive of medical renal disease. 3. Bilateral renal cysts are noted measuring up to approximately 7 cm on the right. 4. Significantly distended urinary bladder. Electronically Signed   By: Constance Holster M.D.   On: 06/07/2020 21:31   DG Chest Portable 1 View  Result Date: 06/07/2020 CLINICAL DATA:  Altered mental status. EXAM: PORTABLE CHEST 1 VIEW COMPARISON:  06/05/2020 FINDINGS: 1424 hours. Low lung volumes. The cardio pericardial silhouette is enlarged. There is pulmonary vascular congestion without overt pulmonary edema. Diffuse interstitial opacities suggest edema. No substantial pleural effusion. Bones are diffusely demineralized. IMPRESSION: Low volume film with cardiomegaly, vascular congestion and interstitial pulmonary edema. Electronically Signed   By: Misty Stanley M.D.   On: 06/07/2020 14:34   ECHOCARDIOGRAM COMPLETE  Addendum Date: 06/08/2020   Compared to prior study in June 2021, possible apical thrombus and apical akinesis not noted in the present study. Now there is no wall motion abnormality. [email or web address] Electronically Amended 06/08/2020, 6:25 PM   Final Loreli Dollar)    Result Date: 06/08/2020    ECHOCARDIOGRAM REPORT   Patient Name:   JULYANA WOOLVERTON Date of Exam: 06/08/2020  Medical Rec #:  [id number]   Height:       63.5 in Accession #:    [id number]  Weight:       274.1 lb Date of Birth:  10/03/1940   BSA:          2.224 m Patient Age:    74 years    BP:           114/60 mmHg Patient Gender: F           HR:           57 bpm. Exam Location:  Inpatient Procedure: 2D Echo Indications:     acute diastolic chf 829.56  History:         Patient has prior history of Echocardiogram examinations, most                  recent 12/06/2019. Cardiomegaly, CAD, Lupus. Chronic kidney                  disease. history of stroke., Signs/Symptoms:Altered Mental                  Status; Risk Factors:Diabetes, Dyslipidemia and Sleep Apnea.  Sonographer:     Johny Chess Referring Phys:  [id number] [place] Diagnosing Phys: Adrian Prows MD IMPRESSIONS  1. Indeterminate diastolic function due to MAC. Left ventricular ejection fraction, by estimation, is 55 to 60%. The left ventricle has normal function. The left ventricle has no regional wall motion abnormalities. There is moderate left ventricular hypertrophy. Left ventricular diastolic parameters are indeterminate.  2. Right ventricular systolic function is normal. The right ventricular size is normal.  3. Left atrial size was severely dilated.  4. Grossly normal in size.  5. The mitral valve is degenerative. No evidence of mitral valve regurgitation.  No evidence of mitral stenosis. Moderate to severe mitral annular calcification.  6. The aortic valve is calcified. Aortic valve regurgitation is not visualized. Mild aortic valve sclerosis is present, with no evidence of aortic valve stenosis.  7. The inferior vena cava is normal in size with greater than 50% respiratory variability, suggesting right atrial pressure of 3 mmHg. Comparison(s): No significant change from prior study. No significant change from 12/06/2019. FINDINGS  Left Ventricle: Indeterminate diastolic function due to MAC. Left ventricular ejection fraction, by estimation, is 55 to 60%. The  left ventricle has normal function. The left ventricle has no regional wall motion abnormalities. The left ventricular internal cavity size was normal in size. There is moderate left ventricular hypertrophy. Left ventricular diastolic parameters are indeterminate. Right Ventricle: The right ventricular size is normal. No increase in right ventricular wall thickness. Right ventricular systolic function is normal. Left Atrium: Left atrial size was severely dilated. Right Atrium: Grossly normal in size. Right atrial size was not well visualized. Pericardium: There is no evidence of pericardial effusion. Mitral Valve: The mitral valve is degenerative in appearance. Moderate to severe mitral annular calcification. No evidence of mitral valve regurgitation. No evidence of mitral valve stenosis. Tricuspid Valve: The tricuspid valve is grossly normal. Tricuspid valve regurgitation is not demonstrated. No evidence of tricuspid stenosis. Aortic Valve: The aortic valve is calcified. Aortic valve regurgitation is not visualized. Mild aortic valve sclerosis is present, with no evidence of aortic valve stenosis. Pulmonic Valve: The pulmonic valve was not well visualized. Pulmonic valve regurgitation is mild. Aorta: The aortic root is normal in size and structure. Venous: The inferior vena cava is normal in size with greater than 50% respiratory variability, suggesting right atrial pressure of 3 mmHg. IAS/Shunts: The interatrial septum was not well visualized.  LEFT VENTRICLE PLAX 2D LVIDd:         4.90 cm Diastology LVIDs:         3.90 cm LV e' medial:  4.79 cm/s LV PW:         1.40 cm LV e' lateral: 7.29 cm/s LV IVS:        1.30 cm  IVC IVC diam: 1.50 cm LEFT ATRIUM             Index LA diam:        3.80 cm 1.71 cm/m LA Vol (A2C):   62.7 ml 28.19 ml/m LA Vol (A4C):   92.4 ml 41.55 ml/m LA Biplane Vol: 77.6 ml 34.89 ml/m   AORTA Ao Asc diam: 3.20 cm Adrian Prows MD Electronically signed by Adrian Prows MD Signature Date/Time:  06/08/2020/6:15:09 PM    Final (Amended)     Medications:   . allopurinol  200 mg Oral Daily  . atorvastatin  20 mg Oral Daily  . DULoxetine  60 mg Oral Daily  . ferrous sulfate  325 mg Oral BID WC  . gabapentin  100 mg Oral QHS  . heparin injection (subcutaneous)  5,000 Units Subcutaneous Q8H  . insulin aspart  0-5 Units Subcutaneous QHS  . insulin aspart  0-9 Units Subcutaneous TID WC  . insulin glargine  10 Units Subcutaneous Daily  . mometasone-formoterol  2 puff Inhalation BID  . montelukast  10 mg Oral QHS  . pantoprazole  40 mg Oral Daily   Continuous Infusions: . cefTRIAXone (ROCEPHIN)  IV 1 g (06/08/20 2156)  . levETIRAcetam 500 mg (06/08/20 2114)     LOS: 2 days   Geradine Girt  [facility]  How to contact the [facility] Attending or Consulting provider [place] or covering provider during after hours [place], for this patient?  1. Check the care team in [facility] and look for a) attending/consulting [facility] provider listed and b) the [facility] team listed 2. Log into [email or web address] and use [place]'s universal password to access. If you do not have the password, please contact the hospital operator. 3. Locate the [facility] provider you are looking for under [facility] and page to a number that you can be directly reached. 4. If you still have difficulty reaching the provider, please page the [facility] (Director on Call) for the Hospitalists listed on amion for assistance.  06/09/2020, 9:54 AM

## 2020-06-09 NOTE — Evaluation (Signed)
Occupational Therapy Evaluation Patient Details Name: Brittany Roberts MRN: [id number] DOB: 1941-02-25 Today's Date: 06/09/2020    History of Present Illness Brittany Roberts is a 79 y.o. female with medical history significant for prior CVA, chronic diastolic CHF, coronary artery disease status post PCI, obesity, essential hypertension, type 2 diabetes, peripheral neuropathy, gout, chronic anxiety/depression, seizure disorder, GERD, asthma, memory deficit/not officially diagnosed with dementia, lupus, LV apical thrombus completed 3 months of anticoagulation, COVID-19 viral infection March 2021, who presented to [facility] ED from home due to worsening altered mental status.  History is obtained from [place], her daughter at bedside, and review of medical records.  Patient was seen in the ED few days prior to this presentation for the same, was found to have a UTI and was sent home on Keflex.  Per daughter she has had worsening confusion.  She had a fall today was possibly down for more than an hour.  CT head and cervical spine nonacute.  Chest x-ray consistent with pulmonary edema.  Work-up in the ED revealed AKI.   Clinical Impression   Pt presents with decline in function and safety with ADLs and ADL mobility with impaired strength, balance, endurance and cognition; pt also HOH, Pt with functional deficits listed below and would benefit from acute OT services to address impairments to maximize level of function and safety    Follow Up Recommendations  SNF;Supervision/Assistance - 24 hour    Equipment Recommendations  Other (comment) (TBD at next venue of care)    Recommendations for Other Services       Precautions / Restrictions Precautions Precautions: Fall Restrictions Weight Bearing Restrictions: No      Mobility Bed Mobility Overal bed mobility: Needs Assistance Bed Mobility: Supine to Sit     Supine to sit: Mod assist;Min assist     General bed mobility comments: cues to initiate LEs to EOB,  min A to elevate trunk and mod A with LEs back onto bed    Transfers Overall transfer level: Needs assistance Equipment used: Rolling walker (2 wheeled) Transfers: Sit to/from [facility] Sit to Stand: Mod assist Stand pivot transfers: Mod assist       General transfer comment: Stood from EOB with hands on RW, pulling up -- will need reiforcement safe hand placement; Pivotal steps bed to recliner with RW, however pt refused to continue and steppiong back towards bed    Balance Overall balance assessment: Needs assistance Sitting-balance support: Bilateral upper extremity supported;Feet supported Sitting balance-Leahy Scale: Fair     Standing balance support: Bilateral upper extremity supported Standing balance-Leahy Scale: Poor                             ADL either performed or assessed with clinical judgement   ADL Overall ADL's : Needs assistance/impaired Eating/Feeding: Independent   Grooming: Wash/dry hands;Wash/dry face;Min guard;Sitting   Upper Body Bathing: Min guard;Sitting   Lower Body Bathing: Maximal assistance   Upper Body Dressing : Min guard;Sitting   Lower Body Dressing: Total assistance   Toilet Transfer: Moderate assistance;RW;Ambulation Toilet Transfer Details (indicate cue type and reason): simulated Toileting- Clothing Manipulation and Hygiene: Maximal assistance;Sit to/from stand       Functional mobility during ADLs: Rolling walker;Cueing for safety;Moderate assistance General ADL Comments: pt requires max - total A with LB selfcare at baseline     Vision Baseline Vision/History: Wears glasses Patient Visual Report: No change from baseline  Perception     Praxis      Pertinent Vitals/Pain Pain Assessment: No/denies pain Faces Pain Scale: No hurt     Hand Dominance Right   Extremity/Trunk Assessment Upper Extremity Assessment Upper Extremity Assessment: Generalized weakness;RUE  deficits/detail RUE Deficits / Details: impaired shoulder flexio 0-90 due to OA per pt's daughter   Lower Extremity Assessment Lower Extremity Assessment: Defer to PT evaluation   Cervical / Trunk Assessment Cervical / Trunk Assessment: Other exceptions Cervical / Trunk Exceptions: incr body habitus   Communication Communication Communication: HOH   Cognition Arousal/Alertness: Awake/alert Behavior During Therapy: WFL for tasks assessed/performed Overall Cognitive Status: Impaired/Different from baseline Area of Impairment: Following commands;Problem solving                       Following Commands: Follows one step commands with increased time     Problem Solving: Decreased initiation;Slow processing General Comments: Slow to answer questions   General Comments       Exercises     Shoulder Instructions      Home Living Family/patient expects to be discharged to:: Private residence Living Arrangements: Alone Available Help at Discharge: Personal care attendant;Available PRN/intermittently;Other (Comment) (PCA 2-3 hrs/day, 5 days/wk assists with bathing, dressing and home mgt per pt's daughter) Type of Home: Apartment Home Access: Level entry     Home Layout: One level     Bathroom Shower/Tub: Occupational psychologist: Handicapped height Bathroom Accessibility: Yes   Home Equipment: Environmental consultant - 4 wheels;Shower seat   Additional Comments: Pt lives alone -  dtr lives across the street      Prior Functioning/Environment    Gait / Transfers Assistance Needed: uses RW ADL's / Homemaking Assistance Needed: Aide assists with cooking, cleaning and bathing and LB ADLs            OT Problem List: Decreased strength;Decreased activity tolerance;Decreased coordination;Impaired balance (sitting and/or standing);Decreased knowledge of use of DME or AE;Pain;Obesity      OT Treatment/Interventions: Self-care/ADL training;Therapeutic exercise;DME and/or  AE instruction;Therapeutic activities    OT Goals(Current goals can be found in the care plan section) Acute Rehab OT Goals Patient Stated Goal: get rehab and go home OT Goal Formulation: With patient/family Time For Goal Achievement: 06/23/20 Potential to Achieve Goals: Good ADL Goals Pt Will Perform Grooming: with min guard assist;with supervision;with set-up;standing Pt Will Perform Upper Body Bathing: with supervision;with set-up;sitting Pt Will Perform Upper Body Dressing: with supervision;with set-up;sitting Pt Will Transfer to Toilet: with min assist;with min guard assist;ambulating;bedside commode;stand pivot transfer  OT Frequency: Min 2X/week   Barriers to D/C:            Co-evaluation              AM-PAC OT "6 Clicks" Daily Activity     Outcome Measure Help from another person eating meals?: None Help from another person taking care of personal grooming?: A Little Help from another person toileting, which includes using toliet, bedpan, or urinal?: Total Help from another person bathing (including washing, rinsing, drying)?: A Lot Help from another person to put on and taking off regular upper body clothing?: A Little Help from another person to put on and taking off regular lower body clothing?: Total 6 Click Score: 14   End of Session Equipment Utilized During Treatment: Gait belt;Rolling walker Nurse Communication: Mobility status  Activity Tolerance: Patient limited by fatigue Patient left: in bed;with call bell/phone within reach;with bed alarm set;with family/visitor  present  OT Visit Diagnosis: Unsteadiness on feet (R26.81);Other abnormalities of gait and mobility (R26.89);Other symptoms and signs involving cognitive function;Muscle weakness (generalized) (M62.81)                Time: 9528-4132 OT Time Calculation (min): 26 min Charges:  OT General Charges $OT Visit: 1 Visit OT Evaluation $OT Eval Moderate Complexity: 1 Mod   Britt Bottom 06/09/2020, 4:10 PM

## 2020-06-10 ENCOUNTER — Ambulatory Visit: Payer: Medicare Other | Admitting: Neurology

## 2020-06-10 DIAGNOSIS — J9611 Chronic respiratory failure with hypoxia: Secondary | ICD-10-CM

## 2020-06-10 DIAGNOSIS — I4891 Unspecified atrial fibrillation: Secondary | ICD-10-CM

## 2020-06-10 DIAGNOSIS — Z9981 Dependence on supplemental oxygen: Secondary | ICD-10-CM

## 2020-06-10 LAB — CBC WITH DIFFERENTIAL/PLATELET
Abs Immature Granulocytes: 0.02 10*3/uL (ref 0.00–0.07)
Basophils Absolute: 0.1 10*3/uL (ref 0.0–0.1)
Basophils Relative: 1 %
Eosinophils Absolute: 0.3 10*3/uL (ref 0.0–0.5)
Eosinophils Relative: 3 %
HCT: 28.3 % — ABNORMAL LOW (ref 36.0–46.0)
Hemoglobin: 9.5 g/dL — ABNORMAL LOW (ref 12.0–15.0)
Immature Granulocytes: 0 %
Lymphocytes Relative: 27 %
Lymphs Abs: 2.7 10*3/uL (ref 0.7–4.0)
MCH: 25.4 pg — ABNORMAL LOW (ref 26.0–34.0)
MCHC: 33.6 g/dL (ref 30.0–36.0)
MCV: 75.7 fL — ABNORMAL LOW (ref 80.0–100.0)
Monocytes Absolute: 1.1 10*3/uL — ABNORMAL HIGH (ref 0.1–1.0)
Monocytes Relative: 10 %
Neutro Abs: 6 10*3/uL (ref 1.7–7.7)
Neutrophils Relative %: 59 %
Platelets: 159 10*3/uL (ref 150–400)
RBC: 3.74 MIL/uL — ABNORMAL LOW (ref 3.87–5.11)
RDW: 15.4 % (ref 11.5–15.5)
WBC: 10.2 10*3/uL (ref 4.0–10.5)
nRBC: 0 % (ref 0.0–0.2)

## 2020-06-10 LAB — BASIC METABOLIC PANEL
Anion gap: 9 (ref 5–15)
BUN: 54 mg/dL — ABNORMAL HIGH (ref 8–23)
CO2: 27 mmol/L (ref 22–32)
Calcium: 11.6 mg/dL — ABNORMAL HIGH (ref 8.9–10.3)
Chloride: 106 mmol/L (ref 98–111)
Creatinine, Ser: 2.45 mg/dL — ABNORMAL HIGH (ref 0.44–1.00)
GFR, Estimated: 19 mL/min — ABNORMAL LOW (ref >60)
Glucose, Bld: 170 mg/dL — ABNORMAL HIGH (ref 70–99)
Potassium: 4.1 mmol/L (ref 3.5–5.1)
Sodium: 142 mmol/L (ref 135–145)

## 2020-06-10 LAB — GLUCOSE, CAPILLARY
Glucose-Capillary: 153 mg/dL — ABNORMAL HIGH (ref 70–99)
Glucose-Capillary: 146 mg/dL — ABNORMAL HIGH (ref 70–99)
Glucose-Capillary: 206 mg/dL — ABNORMAL HIGH (ref 70–99)
Glucose-Capillary: 159 mg/dL — ABNORMAL HIGH (ref 70–99)

## 2020-06-10 LAB — VITAMIN D 25 HYDROXY (VIT D DEFICIENCY, FRACTURES): Vit D, 25-Hydroxy: 83.04 ng/mL (ref 30–100)

## 2020-06-10 NOTE — Progress Notes (Signed)
Progress Note    RAIZA KIESEL  MEQ:[id number] DOB: 06-Jun-1941  DOA: 06/07/2020 PCP: [name], [name], MD    Brief Narrative:     Medical records reviewed and are as summarized below:  MARYJANE BENEDICT is an 79 y.o. female with medical history significant for prior CVA, chronic diastolic CHF, coronary artery disease status post PCI, obesity, essential hypertension, type 2 diabetes, peripheral neuropathy, gout, chronic anxiety/depression, seizure disorder, GERD, asthma, memory deficit/not officially diagnosed with dementia, lupus, LV apical thrombus completed 3 months of anticoagulation, COVID-19 viral infection March 2021, who presented to [facility] ED from home due to worsening altered mental status.  History is obtained from [place], her daughter at bedside, and review of medical records.  Patient was seen in the ED few days prior to this presentation for the same, was found to have a UTI and was sent home on Keflex.  Per daughter she has had worsening confusion.  She had a fall today was possibly down for more than an hour.  Found to have AKI and a fib.  Mental status continues to wax and wane.  Monitoring hgb and checking stool for blood. Eventually will need SNF placement.  Assessment/Plan:   Active Problems:   Chronic kidney disease, stage III (moderate) (HCC)   Chronic respiratory failure with hypoxia, on home O2 therapy (HCC)   AMS (altered mental status)   Altered mental status/acute metabolic encephalopathy, likely secondary to presumed UTI, POA Seen in the ED 2 days prior with UA positive for pyuria, was sent home on Keflex.  No urine cultures available. Obtain UA and urine culture to narrow down antibiotics Rocephin empirically for presumed UTI x 3 days CT head non-acute Re-orient as needed delirium precautions -continues to wax and wane in orientation- consider MRI brain to r/o CVA if not improving by the AM (new a fib) -d/c namenda  Atrial fib (parox) -appears new -did have recent  apical thrombus and was treated with warfarin by Dr. Robb Matar for 3 months -start eliquis- check stools for blood-- patient does not take blood products so this complicated her situation -echo done -TSH: normal -per sleep study in 2020 does not have OSA  Chronic respiratory failure -on 3L at home per ER note on 12/2?  Patient denies being on home O2 (chart review from PCP shows O2 since 2016 and daughter confirms 3L)  chronic diastolic CHF Last 2D echo showing LVEF 50 to 55% with grade 1 diastolic dysfunction. -bnp mildly elevated Strict I's and O's and daily weight- weight NOT DONE on 12/6 or 12/7 and I highly doubt that the weight change from 12/4 to 12/5 is accurate- have re-ordered daily weights  AKI on CKD 3B, likely prerenal in the setting of dehydration from poor oral intake and diuretic Baseline creatinine appears to be 1.6 with GFR of 33 Presented with creatinine of 3.17 with GFR of 14 renal ultrasound: Findings suggestive of medical renal disease. Bilateral renal cysts are noted measuring up to approximately 7 cm on the right. Significantly distended urinary bladder Avoid nephrotoxins, dehydration and hypotension Closely monitor volume status while on gentle IV fluid in the setting of cardiomyopathy Closely monitor urine output Daily renal panel -bladder scan  Presumed UTI -s/p 3 days of IV abx -partially treated already with PO abx  Type 2 diabetes with hyperglycemia -hemoglobin A1c: 8.5 -insulin sliding scale  Ambulatory dysfunction with a fall -PT recommends SNF Fall precautions  Elevated CPK post fall -per chart review, appears to be  chronic  Elevated calcium -? Dehydration vs another etiology -check iPTH and vit D: normal range  Seizure disorder -oral keppra as she is eating  Seizure precautions  Chronic anxiety/depression/GERD/peripheral neuropathy Resume home regimen  History of COVID-19 in March 2021  History of LV apical  thrombus Completed 3 months of anticoagulation  Blood products refusal, patient is Jehovah witness No blood products  Prior history of CVA/coronary artery disease Not on any antiplatelets or statin, unclear if she has any contraindications or intolerances to these.  obesity Estimated body mass index is 50.74 kg/m as calculated from the following:   Height as of 05/22/20: 5' 3.5" (1.613 m).   Weight as of this encounter: 132 kg.   Family Communication/Anticipated D/C date and plan/Code Status   DVT prophylaxis: heparin Code Status: dnr Disposition Plan: Status is: Inpatient Spoke with daughter, [name] on 12/5 and daughter antoinette on 12/6 and 12/7: patient lives alone but another daughter lives across the street.  She has an aide 2 1/2 hours/day.  Family provides meals and gives meds.  Family prefers she goes to SNF.  This AM they feel she is back closer to her baseline Remains inpatient appropriate because:Inpatient level of care appropriate due to severity of illness   Dispo:  Patient From: Home  Planned Disposition: [place]  Expected discharge date: 06/13/20  Medically stable for discharge: No          Medical Consultants:    None.     Subjective:   Became more confused as day went on yesterday- reported by daughter  Objective:    Vitals:   06/10/20 0113 06/10/20 0532 06/10/20 0915 06/10/20 0945  BP: 104/78 (!) 104/43  (!) 115/53  Pulse: 68 (!) 52  (!) 55  Resp:  16  17  Temp:  98.2 F (36.8 C)  98 F (36.7 C)  TempSrc:      SpO2: 96% 100% 99% 100%  Weight:        Intake/Output Summary (Last 24 hours) at 06/10/2020 1424 Last data filed at 06/10/2020 1330 Gross per 24 hour  Intake 1202.62 ml  Output 925 ml  Net 277.62 ml   Filed Weights   06/07/20 2258 06/08/20 2014  Weight: 124.3 kg 132 kg    Exam:   General: Appearance:    Severely obese female in bed, confused     Lungs:     On [place], respirations unlabored  Heart:     Bradycardic. Normal rhythm. No murmurs, rubs, or gallops.   MS:   All extremities are intact.   Neurologic:   Will awaken but confused to situation/time/place-- knows daughter moves all 4 ext      Data Reviewed:   I have personally reviewed following labs and imaging studies:  Labs: Labs show the following:   Basic Metabolic Panel: Recent Labs  Lab 06/05/20 1156 06/05/20 1156 06/07/20 1458 06/07/20 1458 06/08/20 0445 06/08/20 0445 06/09/20 0228 06/10/20 0249  NA 139  --  139  --  142  --  140 142  K 3.6   < > 3.9   < > 3.7   < > 3.9 4.1  CL 101  --  101  --  103  --  105 106  CO2 27  --  25  --  27  --  25 27  GLUCOSE 226*  --  252*  --  200*  --  152* 170*  BUN 69*  --  63*  --  59*  --  54* 54*  CREATININE 2.51*  --  3.17*  --  2.53*  --  2.48* 2.45*  CALCIUM 11.2*  --  12.0*  --  11.2*  --  11.4* 11.6*  MG  --   --   --   --  1.9  --   --   --    < > = values in this interval not displayed.   GFR Estimated Creatinine Clearance: 24.6 mL/min (A) (by C-G formula based on SCr of 2.45 mg/dL (H)). Liver Function Tests: Recent Labs  Lab 06/05/20 1156 06/07/20 1458  AST 25 38  ALT 18 21  ALKPHOS 105 97  BILITOT 0.8 0.8  PROT 7.7 7.5  ALBUMIN 3.5 3.4*   No results for input(s): LIPASE, AMYLASE in the last 168 hours. Recent Labs  Lab 06/07/20 1520  AMMONIA <9*   Coagulation profile No results for input(s): INR, PROTIME in the last 168 hours.  CBC: Recent Labs  Lab 06/05/20 1156 06/07/20 1458 06/08/20 0634 06/09/20 0228 06/10/20 0249  WBC 13.0* 14.0* 12.8* 9.7 10.2  NEUTROABS 9.2* 11.0* 8.9* 6.0 6.0  HGB 10.7* 11.7* 11.0* 10.4* 9.5*  HCT 31.5* 34.9* 32.3* 30.8* 28.3*  MCV 75.9* 75.7* 75.8* 75.7* 75.7*  PLT 180 179 157 171 159   Cardiac Enzymes: Recent Labs  Lab 06/07/20 1458 06/08/20 0445  CKTOTAL 317* 301*   BNP (last 3 results) No results for input(s): PROBNP in the last 8760 hours. CBG: Recent Labs  Lab 06/09/20 1148 06/09/20 1730  06/09/20 2142 06/10/20 0700 06/10/20 1125  GLUCAP 204* 155* 158* 153* 146*   D-Dimer: No results for input(s): DDIMER in the last 72 hours. Hgb A1c: Recent Labs    06/07/20 2220  HGBA1C 8.5*   Lipid Profile: No results for input(s): CHOL, HDL, LDLCALC, TRIG, CHOLHDL, LDLDIRECT in the last 72 hours. Thyroid function studies: Recent Labs    06/07/20 1458  TSH 2.934   Anemia work up: No results for input(s): VITAMINB12, FOLATE, FERRITIN, TIBC, IRON, RETICCTPCT in the last 72 hours. Sepsis Labs: Recent Labs  Lab 06/05/20 1156 06/05/20 1156 06/07/20 1458 06/08/20 0634 06/09/20 0228 06/10/20 0249  WBC 13.0*   < > 14.0* 12.8* 9.7 10.2  LATICACIDVEN 1.2  --   --   --   --   --    < > = values in this interval not displayed.    Microbiology Recent Results (from the past 240 hour(s))  Resp Panel by RT-PCR (Flu A&B, Covid) Nasopharyngeal Swab     Status: None   Collection Time: 06/07/20  4:20 PM   Specimen: Nasopharyngeal Swab; Nasopharyngeal(NP) swabs in vial transport medium  Result Value Ref Range Status   SARS Coronavirus 2 by RT PCR NEGATIVE NEGATIVE Final    Comment: (NOTE) SARS-CoV-2 target nucleic acids are NOT DETECTED.  The SARS-CoV-2 RNA is generally detectable in upper respiratory specimens during the acute phase of infection. The lowest concentration of SARS-CoV-2 viral copies this assay can detect is 138 copies/mL. A negative result does not preclude SARS-Cov-2 infection and should not be used as the sole basis for treatment or other patient management decisions. A negative result may occur with  improper specimen collection/handling, submission of specimen other than nasopharyngeal swab, presence of viral mutation(s) within the areas targeted by this assay, and inadequate number of viral copies(<138 copies/mL). A negative result must be combined with clinical observations, patient history, and epidemiological information. The expected result is  Negative.  Fact Sheet for Patients:  EntrepreneurPulse.com.au  Fact Sheet for Healthcare Providers:  IncredibleEmployment.be  This test is no t yet approved or cleared by the [place] FDA and  has been authorized for detection and/or diagnosis of SARS-CoV-2 by FDA under an Emergency Use Authorization (EUA). This EUA will remain  in effect (meaning this test can be used) for the duration of the COVID-19 declaration under Section 564(b)(1) of the Act, 21 U.S.C.section 360bbb-3(b)(1), unless the authorization is terminated  or revoked sooner.       Influenza A by PCR NEGATIVE NEGATIVE Final   Influenza B by PCR NEGATIVE NEGATIVE Final    Comment: (NOTE) The Xpert Xpress SARS-CoV-2/FLU/RSV plus assay is intended as an aid in the diagnosis of influenza from Nasopharyngeal swab specimens and should not be used as a sole basis for treatment. Nasal washings and aspirates are unacceptable for Xpert Xpress SARS-CoV-2/FLU/RSV testing.  Fact Sheet for Patients: EntrepreneurPulse.com.au  Fact Sheet for Healthcare Providers: IncredibleEmployment.be  This test is not yet approved or cleared by the [place] FDA and has been authorized for detection and/or diagnosis of SARS-CoV-2 by FDA under an Emergency Use Authorization (EUA). This EUA will remain in effect (meaning this test can be used) for the duration of the COVID-19 declaration under Section 564(b)(1) of the Act, 21 U.S.C. section 360bbb-3(b)(1), unless the authorization is terminated or revoked.  Performed at [facility], [place] [address] [place], [place] [address]   Urine culture     Status: None   Collection Time: 06/07/20  6:16 PM   Specimen: Urine, Random  Result Value Ref Range Status   Specimen Description URINE, RANDOM  Final   Special Requests NONE  Final   Culture   Final    NO GROWTH Performed at [facility], [place]  [address] [place] [address] [place], [place] [address]    Report Status 06/09/2020 FINAL  Final  Culture, blood (routine x 2)     Status: None (Preliminary result)   Collection Time: 06/08/20  4:46 AM   Specimen: BLOOD LEFT HAND  Result Value Ref Range Status   Specimen Description BLOOD LEFT HAND  Final   Special Requests   Final    BOTTLES DRAWN AEROBIC ONLY Blood Culture results may not be optimal due to an inadequate volume of blood received in culture bottles   Culture   Final    NO GROWTH 2 DAYS Performed at [facility], [place] [address] [place],  [address]    Report Status PENDING  Incomplete    Procedures and diagnostic studies:  ECHOCARDIOGRAM COMPLETE  Addendum Date: 06/08/2020   Compared to prior study in June 2021, possible apical thrombus and apical akinesis not noted in the present study. Now there is no wall motion abnormality. [email or web address] Electronically Amended 06/08/2020, 6:25 PM   Final Loreli Dollar)    Result Date: 06/08/2020    ECHOCARDIOGRAM REPORT   Patient Name:   BREEONNA MONE Date of Exam: 06/08/2020 Medical Rec #:  [id number]   Height:       63.5 in Accession #:    [id number]  Weight:       274.1 lb Date of Birth:  [birth year]   BSA:          2.224 m Patient Age:    66 years    BP:           114/60 mmHg Patient Gender: F           HR:  57 bpm. Exam Location:  Inpatient Procedure: 2D Echo Indications:     acute diastolic chf 546.50  History:         Patient has prior history of Echocardiogram examinations, most                  recent 12/06/2019. Cardiomegaly, CAD, Lupus. Chronic kidney                  disease. history of stroke., Signs/Symptoms:Altered Mental                  Status; Risk Factors:Diabetes, Dyslipidemia and Sleep Apnea.  Sonographer:     Johny Chess Referring Phys:  [id number] [place] Diagnosing Phys: Adrian Prows MD IMPRESSIONS  1. Indeterminate diastolic function due to MAC. Left ventricular ejection fraction, by estimation, is 55 to 60%. The left ventricle has  normal function. The left ventricle has no regional wall motion abnormalities. There is moderate left ventricular hypertrophy. Left ventricular diastolic parameters are indeterminate.  2. Right ventricular systolic function is normal. The right ventricular size is normal.  3. Left atrial size was severely dilated.  4. Grossly normal in size.  5. The mitral valve is degenerative. No evidence of mitral valve regurgitation. No evidence of mitral stenosis. Moderate to severe mitral annular calcification.  6. The aortic valve is calcified. Aortic valve regurgitation is not visualized. Mild aortic valve sclerosis is present, with no evidence of aortic valve stenosis.  7. The inferior vena cava is normal in size with greater than 50% respiratory variability, suggesting right atrial pressure of 3 mmHg. Comparison(s): No significant change from prior study. No significant change from 12/06/2019. FINDINGS  Left Ventricle: Indeterminate diastolic function due to MAC. Left ventricular ejection fraction, by estimation, is 55 to 60%. The left ventricle has normal function. The left ventricle has no regional wall motion abnormalities. The left ventricular internal cavity size was normal in size. There is moderate left ventricular hypertrophy. Left ventricular diastolic parameters are indeterminate. Right Ventricle: The right ventricular size is normal. No increase in right ventricular wall thickness. Right ventricular systolic function is normal. Left Atrium: Left atrial size was severely dilated. Right Atrium: Grossly normal in size. Right atrial size was not well visualized. Pericardium: There is no evidence of pericardial effusion. Mitral Valve: The mitral valve is degenerative in appearance. Moderate to severe mitral annular calcification. No evidence of mitral valve regurgitation. No evidence of mitral valve stenosis. Tricuspid Valve: The tricuspid valve is grossly normal. Tricuspid valve regurgitation is not demonstrated. No  evidence of tricuspid stenosis. Aortic Valve: The aortic valve is calcified. Aortic valve regurgitation is not visualized. Mild aortic valve sclerosis is present, with no evidence of aortic valve stenosis. Pulmonic Valve: The pulmonic valve was not well visualized. Pulmonic valve regurgitation is mild. Aorta: The aortic root is normal in size and structure. Venous: The inferior vena cava is normal in size with greater than 50% respiratory variability, suggesting right atrial pressure of 3 mmHg. IAS/Shunts: The interatrial septum was not well visualized.  LEFT VENTRICLE PLAX 2D LVIDd:         4.90 cm Diastology LVIDs:         3.90 cm LV e' medial:  4.79 cm/s LV PW:         1.40 cm LV e' lateral: 7.29 cm/s LV IVS:        1.30 cm  IVC IVC diam: 1.50 cm LEFT ATRIUM  Index LA diam:        3.80 cm 1.71 cm/m LA Vol (A2C):   62.7 ml 28.19 ml/m LA Vol (A4C):   92.4 ml 41.55 ml/m LA Biplane Vol: 77.6 ml 34.89 ml/m   AORTA Ao Asc diam: 3.20 cm Adrian Prows MD Electronically signed by Adrian Prows MD Signature Date/Time: 06/08/2020/6:15:09 PM    Final (Amended)     Medications:   . allopurinol  200 mg Oral Daily  . apixaban  2.5 mg Oral BID  . atorvastatin  20 mg Oral Daily  . DULoxetine  60 mg Oral Daily  . ferrous sulfate  325 mg Oral BID WC  . gabapentin  100 mg Oral QHS  . insulin aspart  0-5 Units Subcutaneous QHS  . insulin aspart  0-9 Units Subcutaneous TID WC  . insulin glargine  10 Units Subcutaneous Daily  . levETIRAcetam  500 mg Oral BID  . mometasone-formoterol  2 puff Inhalation BID  . montelukast  10 mg Oral QHS  . pantoprazole  40 mg Oral Daily   Continuous Infusions: . sodium chloride 50 mL/hr at 06/09/20 1158     LOS: 3 days   Geradine Girt  [facility]   How to contact the [facility] Attending or Consulting provider [place] or covering provider during after hours [place], for this patient?  1. Check the care team in [facility] and look for a) attending/consulting [facility] provider  listed and b) the [facility] team listed 2. Log into [email or web address] and use [place]'s universal password to access. If you do not have the password, please contact the hospital operator. 3. Locate the [facility] provider you are looking for under [facility] and page to a number that you can be directly reached. 4. If you still have difficulty reaching the provider, please page the [facility] (Director on Call) for the Hospitalists listed on amion for assistance.  06/10/2020, 2:24 PM

## 2020-06-10 NOTE — Progress Notes (Signed)
PT Cancellation Note  Patient Details Name: Brittany Roberts MRN: [id number] DOB: April 21, 1941   Cancelled Treatment:    Reason Eval/Treat Not Completed: Fatigue/lethargy limiting ability to participate. Attempted to awaken pt, called her and her family assisted as well. Could not get her to sit up or move, yelled out when PT removed a blanket when she agreed to sit up at one point.  Pt cannot verbalize why she does not want therapy today.   Ramond Dial 06/10/2020, 3:43 PM   Mee Hives, PT MS Acute Rehab Dept. Number: [place] and [place]

## 2020-06-10 NOTE — Progress Notes (Signed)
2nd covid shot 12/13/19 at [facility]

## 2020-06-10 NOTE — NC FL2 (Signed)
[place] LEVEL OF CARE SCREENING TOOL     IDENTIFICATION  Patient Name: Brittany Roberts Birthdate: September 01, 1940 Sex: female Admission Date (Current Location): 06/07/2020  [place] and [place] Number:  Kathleen Argue [id number] Eva and Address:  [facility], [place] [address] [place], [place] [address]      Provider Number: [id number]  Attending Physician Name and Address:  Geradine Girt, DO  Relative Name and Phone Number:  Daughters Sarika Baldini - [phone] and [name] - [phone]    Current Level of Care: Hospital Recommended Level of Care: [place] Prior Approval Number:    Date Approved/Denied:   PASRR Number: [id number]  Discharge Plan: SNF    Current Diagnoses: Patient Active Problem List   Diagnosis Date Noted  . Atrial fibrillation with RVR ([place]) 06/10/2020  . AMS (altered mental status) 06/07/2020  . Hypomagnesemia 02/20/2020  . Chronic respiratory failure with hypoxia, on home O2 therapy ([place]) 02/20/2020  . Monitoring for long-term anticoagulant use 12/17/2019  . LV (left ventricular) mural thrombus 12/11/2019  . Acute on chronic diastolic CHF (congestive heart failure) ([place]) 12/06/2019  . Pneumonia due to COVID-19 virus 11/01/2019  . Respiratory failure ([place]) 11/01/2019  . Memory loss 04/05/2019  . Cerebrovascular accident (CVA) ([place]) 04/05/2019  . Seizures ([place]) 04/05/2019  . Obstructive sleep apnea 04/05/2019  . Chronic heart failure with preserved ejection fraction ([place]) 01/17/2019  . OP (osteoporosis) 01/01/2019  . Abdominal wall pain in right flank 08/13/2018  . Alopecia, scarring 08/11/2018  . Chronic right shoulder pain 03/15/2018  . Bilateral primary osteoarthritis of knee 02/09/2018  . Pain in right ankle and joints of right foot 02/09/2018  . Acute exacerbation of congestive heart failure ([place]) 12/13/2017  . Chronic kidney disease (CKD), stage IV (severe) ([place]) 09/01/2017  . Gout,  arthropathy 09/01/2017  . Impacted cerumen of both ears 05/13/2017  . Sensorineural hearing loss, bilateral 05/13/2017  . Localization-related symptomatic epilepsy and epileptic syndromes with complex partial seizures, not intractable, without status epilepticus ([place]) 04/25/2017  . Fibromyalgia 04/08/2017  . Allergic rhinitis 03/11/2017  . Cardiomyopathy ([place]) 01/22/2017  . Hyperlipidemia associated with type 2 diabetes mellitus ([place]) 01/22/2017  . Bipolar disorder ([place]) 01/22/2017  . Seizure ([place])   . Thoracic aortic atherosclerosis ([place]) 01/14/2017  . Coronary artery calcification seen on CAT scan 01/14/2017  . Acute delirium 01/14/2017  . Possible Tonic-clonic seizure disorder ([place]) 01/14/2017  . CVA (cerebral vascular accident) ([place]) 01/14/2017  . Altered mental status 01/13/2017  . Mastalgia 04/20/2016  . Bilateral leg edema 02/10/2016  . Difficulty hearing 10/30/2015  . Midline low back pain with right-sided sciatica 09/25/2015  . Carpal tunnel syndrome on left 08/14/2015  . Chest pain of uncertain etiology 79/08/4095  . Primary localized osteoarthrosis, hand 06/19/2015  . Radial styloid tenosynovitis 06/19/2015  . Dysarthria 02/14/2015  . Type 2 diabetes mellitus with complication ([place])   . Cephalalgia   . Hx of systemic lupus erythematosus (SLE) ([place])   . Thyroid mass 04/26/2014  . Constipation 12/10/2013  . Benign neoplasm of colon 12/10/2013  . Type 2 diabetes mellitus with diabetic neuropathy, unspecified ([place]) 11/27/2013  . Renal cyst, right 11/09/2013  . Insulin dependent type 2 diabetes mellitus ([place]) 10/29/2013  . Hoarseness or changing voice 09/27/2013  . Dyskinesia, tardive 07/11/2013  . TMJ syndrome 02/16/2013  . Angina pectoris associated with type 2 diabetes mellitus ([place]) 08/08/2012  . Coronary artery disease of native artery of native heart with stable angina  pectoris ([place]) 08/08/2012  . Asthma, chronic, PRESUMED wiht MULTIFACTORIAL DYSPNEA 05/30/2012  .  Insomnia 09/22/2011  . Mobility poor 02/02/2011  . Chronic kidney disease, stage III (moderate) ([place]) 05/13/2010  . Chronic systolic (congestive) heart failure ([place]) 03/31/2010  . COPD (chronic obstructive pulmonary disease) ([place]aur[place]) 03/26/2010  . Morbid obesity ([place]) 02/27/2010  . Unstable gait 02/27/2010  . DM (diabetes mellitus), type 2 with neurological complications ([place]) 96/28/3662  . Arthritis 04/25/2009  . Anemia 03/11/2007  . COGNITIVE IMPAIRMENT, MILD, SO STATED 03/11/2007  . RESTLESS LEG SYNDROME 03/11/2007  . Lupus ([place]) 03/11/2007  . OSTEOARTHROSIS, GENERALIZED, MULTIPLE SITES 03/11/2007  . Hypothyroidism 03/07/2007  . Hypercholesteremia 03/07/2007  . Depression 03/07/2007  . Hypertensive heart disease with heart failure ([place]) 03/07/2007  . Coronary atherosclerosis 03/07/2007  . GERD 03/07/2007  . SLEEP APNEA 03/07/2007    Orientation RESPIRATION BLADDER Height & Weight     Self  O2 (2 Liters oxygen) Incontinent, External catheter (Catheter placed 12/4) Weight: 288 lb (130.6 kg) Height:     BEHAVIORAL SYMPTOMS/MOOD NEUROLOGICAL BOWEL NUTRITION STATUS    Convulsions/Seizures (History of seizures) Continent Diet (Heart healthy/carb modified)  AMBULATORY STATUS COMMUNICATION OF NEEDS Skin   Total Care (Patient was unable to ambulate with PT on 12/5) Verbally Normal                       Personal Care Assistance Level of Assistance  Bathing, Feeding, Dressing Bathing Assistance: Maximum assistance Feeding assistance: Independent Dressing Assistance: Maximum assistance     Functional Limitations Info  Sight, Hearing, Speech Sight Info: Adequate Hearing Info: Adequate Speech Info: Adequate    SPECIAL CARE FACTORS FREQUENCY  PT (By licensed PT), OT (By licensed OT)     PT Frequency: Evaluated 12/5. PT at SNF eval and treat, a minimum of 5 days per week OT Frequency: Evaluated 12/6. OT at SNF eval and treat, a minimum of 5 days per week             Contractures Contractures Info: Not present    Additional Factors Info  Code Status, Allergies, Insulin Sliding Scale Code Status Info: DNR Allergies Info: Ace Inhibitors, Isosorb Dinitrate-hydralazine, Penicillins, Buspirone, Pregabalin, Ropinirole Hydrochloride, Amantadines   Insulin Sliding Scale Info: 0-9 Units 3 times per day with meals; 0-5 Units daily at bedtime       Current Medications (06/10/2020):  This is the current hospital active medication list Current Facility-Administered Medications  Medication Dose Route Frequency Provider Last Rate Last Admin  . 0.9 %  sodium chloride infusion   Intravenous Continuous Eulogio Bear U, DO 50 mL/hr at 06/09/20 1158 New Bag at 06/09/20 1158  . acetaminophen (TYLENOL) tablet 650 mg  650 mg Oral Q6H PRN Irene Pap N, DO   650 mg at 06/09/20 1433  . allopurinol (ZYLOPRIM) tablet 200 mg  200 mg Oral Daily Irene Pap N, DO   200 mg at 06/10/20 1017  . apixaban (ELIQUIS) tablet 2.5 mg  2.5 mg Oral BID Eulogio Bear U, DO   2.5 mg at 06/10/20 1017  . atorvastatin (LIPITOR) tablet 20 mg  20 mg Oral Daily Hall, Carole N, DO   20 mg at 06/10/20 1300  . DULoxetine (CYMBALTA) DR capsule 60 mg  60 mg Oral Daily Irene Pap N, DO   60 mg at 06/10/20 1017  . ferrous sulfate tablet 325 mg  325 mg Oral BID WC Irene Pap N, DO   325 mg at 06/10/20 0847  .  gabapentin (NEURONTIN) capsule 100 mg  100 mg Oral QHS Vann, Jessica U, DO   100 mg at 06/09/20 2258  . insulin aspart (novoLOG) injection 0-5 Units  0-5 Units Subcutaneous QHS Kayleen Memos, DO   2 Units at 06/08/20 2112  . insulin aspart (novoLOG) injection 0-9 Units  0-9 Units Subcutaneous TID WC Irene Pap N, DO   1 Units at 06/10/20 1300  . insulin glargine (LANTUS) injection 10 Units  10 Units Subcutaneous Daily Eulogio Bear U, DO   10 Units at 06/10/20 1016  . levETIRAcetam (KEPPRA) tablet 500 mg  500 mg Oral BID Eulogio Bear U, DO   500 mg at 06/10/20 1017  . mometasone-formoterol  (DULERA) 100-5 MCG/ACT inhaler 2 puff  2 puff Inhalation BID Irene Pap N, DO   2 puff at 06/10/20 0915  . montelukast (SINGULAIR) tablet 10 mg  10 mg Oral QHS Vann, Jessica U, DO   10 mg at 06/09/20 2258  . ondansetron (ZOFRAN) injection 4 mg  4 mg Intravenous Q6H PRN Irene Pap N, DO      . pantoprazole (PROTONIX) EC tablet 40 mg  40 mg Oral Daily Irene Pap N, DO   40 mg at 06/10/20 1017     Discharge Medications: Please see discharge summary for a list of discharge medications.  Relevant Imaging Results:  Relevant Lab Results:   Additional Information ss#[id number]. Patient has had COVID vaccinations: 10/04/19 and 12/13/19  Sable Feil, LCSW

## 2020-06-10 NOTE — TOC Initial Note (Signed)
Transition of Care [facility]) - Initial/Assessment Note    Patient Details  Name: Brittany Roberts MRN: [id number] Date of Birth: 1940-11-04  [facility] CM/SW Contact:    Sable Feil, LCSW Phone Number: 06/10/2020, 3:51 PM  Clinical Narrative:  CSW talked with patient's daughter Leotta Weingarten at bedside regarding recommendation of [place] rehab. When asked, daughter reported that her mother has been to [place] [facility], [facility] and [facility] and their experiences at the facilities shared by daughter. Ms. Piltz shared that she and her sister look after their mom and she lives in same apartment complex her mother lives in. She added that her sister works, so she looks after her mother during the week and her sister helps out on the weekends.  Daughter expressed agreement with [facility] and the facility search process was explained. Ms. Daidone was provided with the Medicare.gov SNF list.                 Expected Discharge Plan: Skilled Nursing Facility Barriers to Discharge: Continued Medical Work up   Patient Goals and CMS Choice Patient states their goals for this hospitalization and ongoing recovery are:: Daughter agreeable to SNF for rehab prior to returning home CMS Medicare.gov Compare Post Acute Care list provided to:: Patient Represenative (must comment) (Daughter Antionette was at the bedside) Choice offered to / list presented to : Adult Children  Expected Discharge Plan and Services Expected Discharge Plan: [place] In-house Referral: Clinical Social Work   Post Acute Care Choice: [place] Living arrangements for the past 2 months: Apartment                                      Prior Living Arrangements/Services Living arrangements for the past 2 months: Apartment Lives with:: Self Patient language and need for interpreter reviewed:: No Do you feel safe going back to the place where you live?: No   Daughter in agreement  with [facility] re[facility]  Need for Family Participation in Patient Care: Yes (Comment) Care giver support system in place?: Yes (comment) Current home services: Homehealth aide (Aide comes 2 hours per day, 5 days per week) Criminal Activity/Legal Involvement Pertinent to Current Situation/Hospitalization: No - Comment as needed  Activities of Daily Living      Permission Sought/Granted Permission sought to share information with : Family Supports Permission granted to share information with : Yes, Verbal Permission Granted  Share Information with NAME: Antionette Edison Pace and/or [name]     Permission granted to share info w Relationship: Daughters  Permission granted to share info w Contact Information: Antionette - [phone]; [name] - [phone]  Emotional Assessment Appearance:: Appears stated age Attitude/Demeanor/Rapport: Other (comment) (Patient was quiet during visit) Affect (typically observed): Appropriate Orientation: : Oriented to Self Alcohol / Substance Use: Tobacco Use, Alcohol Use, Illicit Drugs (Per H&P, patient quit smoking and does not drink alcohol or use illicit drugs) Psych Involvement: No (comment)  Admission diagnosis:  Dehydration [E86.0] AKI (acute kidney injury) ([place]) [N17.9] AMS (altered mental status) [R41.82] Patient Active Problem List   Diagnosis Date Noted  . Atrial fibrillation with RVR ([place]) 06/10/2020  . AMS (altered mental status) 06/07/2020  . Hypomagnesemia 02/20/2020  . Chronic respiratory failure with hypoxia, on home O2 therapy ([place]) 02/20/2020  . Monitoring for long-term anticoagulant use 12/17/2019  . LV (left ventricular) mural thrombus 12/11/2019  . Acute on chronic  diastolic CHF (congestive heart failure) ([place]) 12/06/2019  . Pneumonia due to COVID-19 virus 11/01/2019  . Respiratory failure ([place]) 11/01/2019  . Memory loss 04/05/2019  . Cerebrovascular accident (CVA) ([place]) 04/05/2019  . Seizures ([place]) 04/05/2019  . Obstructive sleep  apnea 04/05/2019  . Chronic heart failure with preserved ejection fraction ([place]) 01/17/2019  . OP (osteoporosis) 01/01/2019  . Abdominal wall pain in right flank 08/13/2018  . Alopecia, scarring 08/11/2018  . Chronic right shoulder pain 03/15/2018  . Bilateral primary osteoarthritis of knee 02/09/2018  . Pain in right ankle and joints of right foot 02/09/2018  . Acute exacerbation of congestive heart failure ([place]arietta) 12/13/2017  . Chronic kidney disease (CKD), stage IV (severe) ([place]) 09/01/2017  . Gout, arthropathy 09/01/2017  . Impacted cerumen of both ears 05/13/2017  . Sensorineural hearing loss, bilateral 05/13/2017  . Localization-related symptomatic epilepsy and epileptic syndromes with complex partial seizures, not intractable, without status epilepticus ([place]) 04/25/2017  . Fibromyalgia 04/08/2017  . Allergic rhinitis 03/11/2017  . Cardiomyopathy ([place]) 01/22/2017  . Hyperlipidemia associated with type 2 diabetes mellitus ([place]) 01/22/2017  . Bipolar disorder ([place]ryville) 01/22/2017  . Seizure ([place])   . Thoracic aortic atherosclerosis ([place]) 01/14/2017  . Coronary artery calcification seen on CAT scan 01/14/2017  . Acute delirium 01/14/2017  . Possible Tonic-clonic seizure disorder ([place]) 01/14/2017  . CVA (cerebral vascular accident) ([place]) 01/14/2017  . Altered mental status 01/13/2017  . Mastalgia 04/20/2016  . Bilateral leg edema 02/10/2016  . Difficulty hearing 10/30/2015  . Midline low back pain with right-sided sciatica 09/25/2015  . Carpal tunnel syndrome on left 08/14/2015  . Chest pain of uncertain etiology 93/71/6967  . Primary localized osteoarthrosis, hand 06/19/2015  . Radial styloid tenosynovitis 06/19/2015  . Dysarthria 02/14/2015  . Type 2 diabetes mellitus with complication ([place])   . Cephalalgia   . Hx of systemic lupus erythematosus (SLE) ([place])   . Thyroid mass 04/26/2014  . Constipation 12/10/2013  . Benign neoplasm of colon 12/10/2013  . Type 2 diabetes mellitus  with diabetic neuropathy, unspecified (Wallis) 11/27/2013  . Renal cyst, right 11/09/2013  . Insulin dependent type 2 diabetes mellitus (Callahan) 10/29/2013  . Hoarseness or changing voice 09/27/2013  . Dyskinesia, tardive 07/11/2013  . TMJ syndrome 02/16/2013  . Angina pectoris associated with type 2 diabetes mellitus ([place]hall) 08/08/2012  . Coronary artery disease of native artery of native heart with stable angina pectoris ([place]) 08/08/2012  . Asthma, chronic, PRESUMED wiht MULTIFACTORIAL DYSPNEA 05/30/2012  . Insomnia 09/22/2011  . Mobility poor 02/02/2011  . Chronic kidney disease, stage III (moderate) (Mesa) 05/13/2010  . Chronic systolic (congestive) heart failure ([place]) 03/31/2010  . COPD (chronic obstructive pulmonary disease) (Soquel) 03/26/2010  . Morbid obesity ([place]ks[place]) 02/27/2010  . Unstable gait 02/27/2010  . DM (diabetes mellitus), type 2 with neurological complications ([place]) 89/38/1017  . Arthritis 04/25/2009  . Anemia 03/11/2007  . COGNITIVE IMPAIRMENT, MILD, SO STATED 03/11/2007  . RESTLESS LEG SYNDROME 03/11/2007  . Lupus (Weedville) 03/11/2007  . OSTEOARTHROSIS, GENERALIZED, MULTIPLE SITES 03/11/2007  . Hypothyroidism 03/07/2007  . Hypercholesteremia 03/07/2007  . Depression 03/07/2007  . Hypertensive heart disease with heart failure ([place]arm[place]) 03/07/2007  . Coronary atherosclerosis 03/07/2007  . GERD 03/07/2007  . SLEEP APNEA 03/07/2007   PCP:  [name], [name], MD Pharmacy:   [facility] [place], [address] [place] [address] Phone: [phone] Fax: [phone]  [facility]  [place], [address], [place], [place] [address] Phone: [phone] Fax: [phone]  [facility] [place], [address] [place] [address] Phone: [phone] Fax: [phone]  [place], [place] - [address] [place] [address] Phone: [phone] Fax: [place], [address] [place] [address] Phone: [phone] Fax: [phone]  [facility] [place], [place] - [address] [place] [address] Phone: [phone] Fax: [phone]    Social Determinants of Health (SDOH) Interventions  No SDOH interventions requested or needed at this time.  Readmission Risk Interventions No flowsheet data found.

## 2020-06-11 DIAGNOSIS — I4891 Unspecified atrial fibrillation: Secondary | ICD-10-CM

## 2020-06-11 DIAGNOSIS — N1832 Chronic kidney disease, stage 3b: Secondary | ICD-10-CM

## 2020-06-11 LAB — CBC
HCT: 26.5 % — ABNORMAL LOW (ref 36.0–46.0)
Hemoglobin: 9.2 g/dL — ABNORMAL LOW (ref 12.0–15.0)
MCH: 26.1 pg (ref 26.0–34.0)
MCHC: 34.7 g/dL (ref 30.0–36.0)
MCV: 75.3 fL — ABNORMAL LOW (ref 80.0–100.0)
Platelets: 154 10*3/uL (ref 150–400)
RBC: 3.52 MIL/uL — ABNORMAL LOW (ref 3.87–5.11)
RDW: 15.8 % — ABNORMAL HIGH (ref 11.5–15.5)
WBC: 10.9 10*3/uL — ABNORMAL HIGH (ref 4.0–10.5)
nRBC: 0 % (ref 0.0–0.2)

## 2020-06-11 LAB — PARATHYROID HORMONE, INTACT (NO CA): PTH: 7 pg/mL — ABNORMAL LOW (ref 15–65)

## 2020-06-11 LAB — BASIC METABOLIC PANEL
Anion gap: 9 (ref 5–15)
BUN: 48 mg/dL — ABNORMAL HIGH (ref 8–23)
CO2: 25 mmol/L (ref 22–32)
Calcium: 11.2 mg/dL — ABNORMAL HIGH (ref 8.9–10.3)
Chloride: 109 mmol/L (ref 98–111)
Creatinine, Ser: 2.07 mg/dL — ABNORMAL HIGH (ref 0.44–1.00)
GFR, Estimated: 24 mL/min — ABNORMAL LOW (ref >60)
Glucose, Bld: 131 mg/dL — ABNORMAL HIGH (ref 70–99)
Potassium: 3.9 mmol/L (ref 3.5–5.1)
Sodium: 143 mmol/L (ref 135–145)

## 2020-06-11 LAB — GLUCOSE, CAPILLARY
Glucose-Capillary: 121 mg/dL — ABNORMAL HIGH (ref 70–99)
Glucose-Capillary: 184 mg/dL — ABNORMAL HIGH (ref 70–99)
Glucose-Capillary: 191 mg/dL — ABNORMAL HIGH (ref 70–99)
Glucose-Capillary: 152 mg/dL — ABNORMAL HIGH (ref 70–99)

## 2020-06-11 MED ORDER — LORAZEPAM 2 MG/ML IJ SOLN
1.0000 mg | INTRAMUSCULAR | Status: AC
  Administered 2020-06-12: 1 mg via INTRAVENOUS
  Filled 2020-06-11: qty 1

## 2020-06-11 MED ORDER — METOPROLOL SUCCINATE ER 25 MG PO TB24
25.0000 mg | ORAL_TABLET | Freq: Every day | ORAL | Status: DC
  Administered 2020-06-11 – 2020-06-12 (×2): 25 mg via ORAL
  Filled 2020-06-11 (×2): qty 1

## 2020-06-11 NOTE — Progress Notes (Signed)
PROGRESS NOTE    Brittany Roberts  BJY:[id number] DOB: 23-Aug-1940 DOA: 06/07/2020 PCP: [place], [name], MD    Brief Narrative:  Brittany Roberts is an 79 year old female with past medical history significant for CVA, chronic diastolic congestive heart failure, CAD s/p PCI, obesity, essential hypertension, type 2 diabetes mellitus, peripheral neuropathy, gout, chronic anxiety/depression, seizure disorder, GERD, asthma, memory deficits, lupus, history of LV apical thrombus status post 3 months of anticoagulation, Covid-19 viral infection March 2021 who presented to [facility] ED due to progressive confusion.  Due to mental status, history obtained from EDP, daughter at bedside and review of medical records.  Patient was previously in the ED few days prior and diagnosed with a UTI and sent home on Keflex.  Per daughter, confusion progressed leading to fall with unknown prolonged downtime.  Patient was found to have acute renal failure and A. fib which is a new diagnosis.  Hospitalist service consulted for further evaluation and management.   Assessment & Plan:   Active Problems:   Chronic kidney disease, stage III (moderate) (HCC)   Chronic respiratory failure with hypoxia, on home O2 therapy (HCC)   AMS (altered mental status)   Atrial fibrillation with RVR (HCC)   Acute metabolic encephalopathy, POA Urinary tract infection Patient presenting with progressive confusion, recently diagnosed with UTI and discharged home on Keflex.  No urine cultures obtained.  CT head with no acute intracranial findings, stable encephalomalacia right frontal lobe from prior infarct and stable atherosclerosis.  UDS negative.  Covid-19 PCR/influenza A/B negative.  Urine culture obtained 06/07/2020 with no growth.  Completed 3-day course of ceftriaxone. --Blood culture 12/5: No growth x3 days --check MRI brain given persistent encephalopathy  Paroxysmal atrial fibrillation, new diagnosis Patient was noted to be in atrial  fibrillation on presentation.  No known prior history but patient did have recent apical thrombus and was treated with Coumadin by cardiology, Dr. Abby Potash for 3 months.  TSH within normal limits. --Eliquis 2.5 mg p.o. twice daily --restart home metoprolol succinate at reduced dose of 25 mg p.o. daily --Monitor on telemetry  Chronic hypoxic respiratory failure On 3 L nasal cannula at baseline.  Continue Dulera 100-5 mcg 2 puffs p.o. twice daily (substituted for Symbicort), montelukast 10 mg p.o. daily.  Chronic diastolic congestive heart failure TTE with LVEF 55-60%, no regional LV wall motion abnormalities, indeterminate LV diastolic parameters, IVC normal in size.  On metoprolol succinate 50 mg p.o. daily, indoor 30 mg p.o. daily, torsemide 20 mg p.o. twice daily and Lipitor at home. --Resume metoprolol succinate at reduced dose 25 mg p.o. daily --Continue Lipitor 20 mg p.o. daily --Strict I's and O's and daily weights  Acute renal failure on CKD stage IIIb Baseline creatinine 1.6 with a GFR of 33.  Patient presented with a creatinine of 3.17 with a GFR of 14.  Etiology likely prerenal dehydration from poor oral intake in the preceding days prior to hospitalization.  Renal ultrasound with findings suggestive of medical renal disease and distended urinary bladder. --Cr 3.17>2.53>2.48>2.45>2.07 --Continue NS at 50 mL's per hour --Avoid nephrotoxins, renally dose all medications --Continue monitor BMP daily  Type 2 diabetes mellitus Hemoglobin A1c 8.5, not optimally controlled. --Lantus 10 units subcutaneously daily --Insulin sliding scale for further coverage --CBGs before every meal/at bedtime  Elevated calcium Etiology likely secondary to dehydration.  PTH intact and vitamin D within normal limits. --Continue IV fluid hydration  Seizure disorder --Keppra 500 mg p.o. twice daily  Anxiety/depression Peripheral neuropathy --Cymbalta 60 mg  p.o. daily --Gabapentin 100 mg p.o.  nightly  GERD: Continue PPI  Hx LV apical thrombus Completed 3 months of anticoagulation previously.  Started on Eliquis 2.5 mg p.o. twice daily for A. fib as above.  History of CVA/CAD Not on antiplatelet or statin.  Unsure if she has any contraindications or intolerance to these.  Outpatient follow-up.  Obesity Body mass index is 49.87 kg/m.  Needs aggressive lifestyle changes/weight loss as this complicates all facets of care.  History of Covid-19 viral infection March 2021    DVT prophylaxis: Eliquis Code Status: Full code Family Communication: No family present at bedside this morning, updated patient daughter Synetta Fail via telephone this afternoon.  Disposition Plan:  Status is: Inpatient  Remains inpatient appropriate because:Altered mental status, Unsafe d/c plan and Inpatient level of care appropriate due to severity of illness   Dispo:  Patient From: Home  Planned Disposition: Skilled Nursing Facility  Expected discharge date: 06/12/20  Medically stable for discharge: No   Consultants:   none  Procedures:   TTE  Antimicrobials:   Ceftriaxone 12/4 - 12/7  Fosfomycin 12/4   Subjective: Patient seen and examined bedside, resting comfortably.  Continues to be confused.  No family present at bedside.  No specific complaints this morning.  Denies chest pain, no shortness of breath, no abdominal pain.  No acute concerns per nursing staff.  Objective: Vitals:   06/11/20 0842 06/11/20 1036 06/11/20 1130 06/11/20 1628  BP:  (!) 156/125 (!) 159/67 (!) 153/87  Pulse: 66 66  68  Resp: 18 18  18   Temp:  (!) 97.4 F (36.3 C)  98.1 F (36.7 C)  TempSrc:  Oral  Oral  SpO2: 96% 99%  99%  Weight:        Intake/Output Summary (Last 24 hours) at 06/11/2020 1629 Last data filed at 06/11/2020 1400 Gross per 24 hour  Intake 1647.63 ml  Output 1050 ml  Net 597.63 ml   Filed Weights   06/08/20 2014 06/10/20 1511 06/10/20 2026  Weight: 132 kg 130.6 kg 129.7 kg     Examination:  General exam: Appears calm and comfortable, confused Respiratory system: Clear to auscultation. Respiratory effort normal.  On 2 L nasal cannula (baseline 3L [place]) Cardiovascular system: S1 & S2 heard, RRR. No JVD, murmurs, rubs, gallops or clicks. No pedal edema. Gastrointestinal system: Abdomen is nondistended, soft and nontender. No organomegaly or masses felt. Normal bowel sounds heard. Central nervous system: Alert. No focal neurological deficits. Extremities: Symmetric 5 x 5 power. Skin: No rashes, lesions or ulcers Psychiatry: Judgement and insight appear poor.     Data Reviewed: I have personally reviewed following labs and imaging studies  CBC: Recent Labs  Lab 06/05/20 1156 06/05/20 1156 06/07/20 1458 06/08/20 0634 06/09/20 0228 06/10/20 0249 06/11/20 0428  WBC 13.0*   < > 14.0* 12.8* 9.7 10.2 10.9*  NEUTROABS 9.2*  --  11.0* 8.9* 6.0 6.0  --   HGB 10.7*   < > 11.7* 11.0* 10.4* 9.5* 9.2*  HCT 31.5*   < > 34.9* 32.3* 30.8* 28.3* 26.5*  MCV 75.9*   < > 75.7* 75.8* 75.7* 75.7* 75.3*  PLT 180   < > 179 157 171 159 154   < > = values in this interval not displayed.   Basic Metabolic Panel: Recent Labs  Lab 06/07/20 1458 06/08/20 0445 06/09/20 0228 06/10/20 0249 06/11/20 0428  NA 139 142 140 142 143  K 3.9 3.7 3.9 4.1 3.9  CL 101 103 105  106 109  CO2 25 27 25 27 25   GLUCOSE 252* 200* 152* 170* 131*  BUN 63* 59* 54* 54* 48*  CREATININE 3.17* 2.53* 2.48* 2.45* 2.07*  CALCIUM 12.0* 11.2* 11.4* 11.6* 11.2*  MG  --  1.9  --   --   --    GFR: Estimated Creatinine Clearance: 28.7 mL/min (A) (by C-G formula based on SCr of 2.07 mg/dL (H)). Liver Function Tests: Recent Labs  Lab 06/05/20 1156 06/07/20 1458  AST 25 38  ALT 18 21  ALKPHOS 105 97  BILITOT 0.8 0.8  PROT 7.7 7.5  ALBUMIN 3.5 3.4*   No results for input(s): LIPASE, AMYLASE in the last 168 hours. Recent Labs  Lab 06/07/20 1520  AMMONIA <9*   Coagulation Profile: No results  for input(s): INR, PROTIME in the last 168 hours. Cardiac Enzymes: Recent Labs  Lab 06/07/20 1458 06/08/20 0445  CKTOTAL 317* 301*   BNP (last 3 results) No results for input(s): PROBNP in the last 8760 hours. HbA1C: No results for input(s): HGBA1C in the last 72 hours. CBG: Recent Labs  Lab 06/10/20 1658 06/10/20 2029 06/11/20 0638 06/11/20 1149 06/11/20 1619  GLUCAP 206* 159* 121* 184* 191*   Lipid Profile: No results for input(s): CHOL, HDL, LDLCALC, TRIG, CHOLHDL, LDLDIRECT in the last 72 hours. Thyroid Function Tests: No results for input(s): TSH, T4TOTAL, FREET4, T3FREE, THYROIDAB in the last 72 hours. Anemia Panel: No results for input(s): VITAMINB12, FOLATE, FERRITIN, TIBC, IRON, RETICCTPCT in the last 72 hours. Sepsis Labs: Recent Labs  Lab 06/05/20 1156  LATICACIDVEN 1.2    Recent Results (from the past 240 hour(s))  Resp Panel by RT-PCR (Flu A&B, Covid) Nasopharyngeal Swab     Status: None   Collection Time: 06/07/20  4:20 PM   Specimen: Nasopharyngeal Swab; Nasopharyngeal(NP) swabs in vial transport medium  Result Value Ref Range Status   SARS Coronavirus 2 by RT PCR NEGATIVE NEGATIVE Final    Comment: (NOTE) SARS-CoV-2 target nucleic acids are NOT DETECTED.  The SARS-CoV-2 RNA is generally detectable in upper respiratory specimens during the acute phase of infection. The lowest concentration of SARS-CoV-2 viral copies this assay can detect is 138 copies/mL. A negative result does not preclude SARS-Cov-2 infection and should not be used as the sole basis for treatment or other patient management decisions. A negative result may occur with  improper specimen collection/handling, submission of specimen other than nasopharyngeal swab, presence of viral mutation(s) within the areas targeted by this assay, and inadequate number of viral copies(<138 copies/mL). A negative result must be combined with clinical observations, patient history, and  epidemiological information. The expected result is Negative.  Fact Sheet for Patients:  BloggerCourse.com  Fact Sheet for Healthcare Providers:  SeriousBroker.it  This test is no t yet approved or cleared by the [place] FDA and  has been authorized for detection and/or diagnosis of SARS-CoV-2 by FDA under an Emergency Use Authorization (EUA). This EUA will remain  in effect (meaning this test can be used) for the duration of the COVID-19 declaration under Section 564(b)(1) of the Act, 21 U.S.C.section 360bbb-3(b)(1), unless the authorization is terminated  or revoked sooner.       Influenza A by PCR NEGATIVE NEGATIVE Final   Influenza B by PCR NEGATIVE NEGATIVE Final    Comment: (NOTE) The Xpert Xpress SARS-CoV-2/FLU/RSV plus assay is intended as an aid in the diagnosis of influenza from Nasopharyngeal swab specimens and should not be used as a sole basis for treatment.  Nasal washings and aspirates are unacceptable for Xpert Xpress SARS-CoV-2/FLU/RSV testing.  Fact Sheet for Patients: BloggerCourse.com  Fact Sheet for Healthcare Providers: SeriousBroker.it  This test is not yet approved or cleared by the [place] FDA and has been authorized for detection and/or diagnosis of SARS-CoV-2 by FDA under an Emergency Use Authorization (EUA). This EUA will remain in effect (meaning this test can be used) for the duration of the COVID-19 declaration under Section 564(b)(1) of the Act, 21 U.S.C. section 360bbb-3(b)(1), unless the authorization is terminated or revoked.  Performed at [facility], [address], [place], [place] [address]   Urine culture     Status: None   Collection Time: 06/07/20  6:16 PM   Specimen: Urine, Random  Result Value Ref Range Status   Specimen Description URINE, RANDOM  Final   Special Requests NONE  Final   Culture   Final    NO  GROWTH Performed at [facility], [address], [place], [place] [address]    Report Status 06/09/2020 FINAL  Final  Culture, blood (routine x 2)     Status: None (Preliminary result)   Collection Time: 06/08/20  4:46 AM   Specimen: BLOOD LEFT HAND  Result Value Ref Range Status   Specimen Description BLOOD LEFT HAND  Final   Special Requests   Final    BOTTLES DRAWN AEROBIC ONLY Blood Culture results may not be optimal due to an inadequate volume of blood received in culture bottles   Culture   Final    NO GROWTH 3 DAYS Performed at [facility], [address] [place], [place] [address]    Report Status PENDING  Incomplete         Radiology Studies: No results found.      Scheduled Meds: . allopurinol  200 mg Oral Daily  . apixaban  2.5 mg Oral BID  . atorvastatin  20 mg Oral Daily  . DULoxetine  60 mg Oral Daily  . ferrous sulfate  325 mg Oral BID WC  . gabapentin  100 mg Oral QHS  . insulin aspart  0-5 Units Subcutaneous QHS  . insulin aspart  0-9 Units Subcutaneous TID WC  . insulin glargine  10 Units Subcutaneous Daily  . levETIRAcetam  500 mg Oral BID  . mometasone-formoterol  2 puff Inhalation BID  . montelukast  10 mg Oral QHS  . pantoprazole  40 mg Oral Daily   Continuous Infusions: . sodium chloride 50 mL/hr at 06/09/20 1158     LOS: 4 days    Time spent: 38 minutes spent on chart review, discussion with nursing staff, consultants, updating family and interview/physical exam; more than 50% of that time was spent in counseling and/or coordination of care.    Alvira Philips [place], DO [facility] Available via Epic secure chat 7am-7pm After these hours, please refer to coverage provider listed on [email or web address] 06/11/2020, 4:29 PM

## 2020-06-11 NOTE — Discharge Instructions (Signed)

## 2020-06-11 NOTE — Progress Notes (Signed)
Physical Therapy Treatment Patient Details Name: Brittany Roberts MRN: [id number] DOB: 1941/04/26 Today's Date: 06/11/2020    History of Present Illness Brittany Roberts is a 79 y.o. female with medical history significant for prior CVA, chronic diastolic CHF, coronary artery disease status post PCI, obesity, essential hypertension, type 2 diabetes, peripheral neuropathy, gout, chronic anxiety/depression, seizure disorder, GERD, asthma, memory deficit/not officially diagnosed with dementia, lupus, LV apical thrombus completed 3 months of anticoagulation, COVID-19 viral infection March 2021, who presented to [facility] ED from home due to worsening altered mental status.  History is obtained from EDP, her daughter at bedside, and review of medical records.  Patient was seen in the ED few days prior to this presentation for the same, was found to have a UTI and was sent home on Keflex.  Per daughter she has had worsening confusion.  She had a fall today was possibly down for more than an hour.  CT head and cervical spine nonacute.  Chest x-ray consistent with pulmonary edema.  Work-up in the ED revealed AKI.    PT Comments    Pt initially resistant to mobility, but once moving pt eager to "get up and go home". Pt ambulated multiple short room distances, requiring min-mod assist for all mobility tasks this day. Pt became increasingly restless towards end of session, perseverative on wanting to go home, and required max +2 assist for safe return to supine. PT to continue to progress mobility as tolerated.     Follow Up Recommendations  SNF     Equipment Recommendations  Rolling walker with 5" wheels;3in1 (PT) ([place] -- may consider Sport and exercise psychologist)    Recommendations for Other Services       Precautions / Restrictions Precautions Precautions: Fall Restrictions Weight Bearing Restrictions: No    Mobility  Bed Mobility Overal bed mobility: Needs Assistance Bed Mobility: Supine to Sit;Sit to Supine      Supine to sit: Mod assist Sit to supine: Max assist;+2 for physical assistance   General bed mobility comments: mod assist for trunk rise and LE translation to EOB, boost to EOB with assist of bed pads. Max +2 to return to supine, pt actively resisting sit>supine as pt states "I am getting up, and going home!"  Transfers Overall transfer level: Needs assistance Equipment used: Rolling walker (2 wheeled) Transfers: Sit to/from Stand Sit to Stand: Mod assist         General transfer comment: Mod assist for power up, rise, and steady. Verbal cuing for proper hand placement, not followed. STS x2 from EOB.  Ambulation/Gait Ambulation/Gait assistance: Min assist Gait Distance (Feet): 5 Feet (x2, + lateral stepping at EOB) Assistive device: Rolling walker (2 wheeled) Gait Pattern/deviations: Step-through pattern;Decreased stride length;Trunk flexed;Wide base of support Gait velocity: decr   General Gait Details: Min assist to steady, guide RW, manage lines/leads. Pt incontinent of urine during gait, requiring pericare and washup assist from PT.   Stairs             Wheelchair Mobility    Modified Rankin (Stroke Patients Only)       Balance Overall balance assessment: Needs assistance Sitting-balance support: Bilateral upper extremity supported;Feet supported Sitting balance-Leahy Scale: Fair     Standing balance support: Bilateral upper extremity supported Standing balance-Leahy Scale: Poor Standing balance comment: reliant on external support                            Cognition Arousal/Alertness: Awake/alert  Behavior During Therapy: WFL for tasks assessed/performed Overall Cognitive Status: Impaired/Different from baseline Area of Impairment: Following commands;Problem solving;Orientation                 Orientation Level: Disoriented to;Place;Situation     Following Commands: Follows one step commands with increased time     Problem  Solving: Decreased initiation;Slow processing General Comments: disoriented to place, states she is about to walk over to her sister's house once sitting EOB. Pt becoming increasingly restless and agitated with continued mobility, espeically when PT encourages pt to lay back down.      Exercises General Exercises - Lower Extremity Long Arc Quad: AROM;Both;10 reps;Seated    General Comments General comments (skin integrity, edema, etc.): on 2LO2      Pertinent Vitals/Pain Pain Assessment: Faces Faces Pain Scale: Hurts little more Pain Location: LEs, when moving them to EOB Pain Descriptors / Indicators: Discomfort;Sore Pain Intervention(s): Limited activity within patient's tolerance;Monitored during session;Repositioned    Home Living                      Prior Function            PT Goals (current goals can now be found in the care plan section) Acute Rehab PT Goals Patient Stated Goal: get rehab and go home PT Goal Formulation: With patient Time For Goal Achievement: 06/22/20 Potential to Achieve Goals: Good Progress towards PT goals: Progressing toward goals    Frequency    Min 2X/week      PT Plan Current plan remains appropriate    Co-evaluation              AM-PAC PT "6 Clicks" Mobility   Outcome Measure  Help needed turning from your back to your side while in a flat bed without using bedrails?: A Little Help needed moving from lying on your back to sitting on the side of a flat bed without using bedrails?: A Lot Help needed moving to and from a bed to a chair (including a wheelchair)?: A Lot Help needed standing up from a chair using your arms (e.g., wheelchair or bedside chair)?: A Lot Help needed to walk in hospital room?: A Little Help needed climbing 3-5 steps with a railing? : Total 6 Click Score: 13    End of Session   Activity Tolerance: Patient limited by fatigue;Treatment limited secondary to agitation Patient left: with call  bell/phone within reach;with family/visitor present;in bed;with bed alarm set Nurse Communication: Mobility status;Other (comment) (agitation with return to supine) PT Visit Diagnosis: Unsteadiness on feet (R26.81);Other abnormalities of gait and mobility (R26.89);Muscle weakness (generalized) (M62.81)     Time: 1427-1500 PT Time Calculation (min) (ACUTE ONLY): 33 min  Charges:  $Gait Training: 8-22 mins $Therapeutic Activity: 8-22 mins                     Konnor Jorden E, PT Acute Rehabilitation Services Pager [phone]  Office [phone]     Pina Sirianni D Despina Hidden 06/11/2020, 4:27 PM

## 2020-06-11 NOTE — TOC Progression Note (Addendum)
Transition of Care [facility]) - Progression Note    Patient Details  Name: Brittany Roberts MRN: [id number] Date of Birth: 1941/01/24  [facility] CM/SW Contact  Brittany Salina Mila Homer, LCSW Phone Number: 06/11/2020, 12:37 PM  Clinical Narrative:  Patient in need of ST rehab and has 3 bed offers. Visited room (12:29 pm) and daughter Brittany Roberts not in room. Called Brittany Roberts and HIPPA compliant voice mail left regarding facility responses. Also sent text to Brittany Roberts with the names of SNF's that made bed offers.   !2:42 pm: Talked with daughters Brittany Roberts and Brittany Roberts by phone regarding facility responses: Daughters expressed that they did not want any of the SNF's that made bed offers. Requested patient's information be sent to: SNF's in [place], [place], [place], [place], [place]. Clinicals transmitted to named SNF's.    CSW will allow time for facilities to respond and communicate with daughters re: regarding responses.   Expected Discharge Plan: [place] Barriers to Discharge: Continued Medical Work up  Expected Discharge Plan and Services Expected Discharge Plan: [place] In-house Referral: Clinical Social Work     Post Acute Care Choice: [place] Living arrangements for the past 2 months: Apartment                                     Social Determinants of Health (SDOH) Interventions  No SDOH interventions requested or needed at this time.  Readmission Risk Interventions No flowsheet data found.

## 2020-06-12 ENCOUNTER — Ambulatory Visit: Payer: Medicare Other | Admitting: Gastroenterology

## 2020-06-12 ENCOUNTER — Inpatient Hospital Stay (HOSPITAL_COMMUNITY): Payer: Medicare Other

## 2020-06-12 LAB — BASIC METABOLIC PANEL
Anion gap: 7 (ref 5–15)
BUN: 38 mg/dL — ABNORMAL HIGH (ref 8–23)
CO2: 26 mmol/L (ref 22–32)
Calcium: 11.2 mg/dL — ABNORMAL HIGH (ref 8.9–10.3)
Chloride: 109 mmol/L (ref 98–111)
Creatinine, Ser: 1.53 mg/dL — ABNORMAL HIGH (ref 0.44–1.00)
GFR, Estimated: 34 mL/min — ABNORMAL LOW (ref >60)
Glucose, Bld: 151 mg/dL — ABNORMAL HIGH (ref 70–99)
Potassium: 4.2 mmol/L (ref 3.5–5.1)
Sodium: 142 mmol/L (ref 135–145)

## 2020-06-12 LAB — MAGNESIUM: Magnesium: 1.8 mg/dL (ref 1.7–2.4)

## 2020-06-12 LAB — GLUCOSE, CAPILLARY
Glucose-Capillary: 155 mg/dL — ABNORMAL HIGH (ref 70–99)
Glucose-Capillary: 160 mg/dL — ABNORMAL HIGH (ref 70–99)
Glucose-Capillary: 147 mg/dL — ABNORMAL HIGH (ref 70–99)
Glucose-Capillary: 141 mg/dL — ABNORMAL HIGH (ref 70–99)

## 2020-06-12 MED ORDER — METOPROLOL TARTRATE 5 MG/5ML IV SOLN
5.0000 mg | INTRAVENOUS | Status: DC | PRN
  Administered 2020-06-13 – 2020-06-14 (×3): 5 mg via INTRAVENOUS
  Filled 2020-06-12 (×3): qty 5

## 2020-06-12 MED ORDER — METOPROLOL SUCCINATE ER 50 MG PO TB24
50.0000 mg | ORAL_TABLET | Freq: Every day | ORAL | Status: DC
  Administered 2020-06-13: 50 mg via ORAL
  Filled 2020-06-12: qty 1

## 2020-06-12 MED ORDER — METOPROLOL SUCCINATE ER 25 MG PO TB24
25.0000 mg | ORAL_TABLET | Freq: Once | ORAL | Status: AC
  Administered 2020-06-12: 25 mg via ORAL
  Filled 2020-06-12: qty 1

## 2020-06-12 MED ORDER — DILTIAZEM HCL 25 MG/5ML IV SOLN
10.0000 mg | Freq: Once | INTRAVENOUS | Status: AC
  Administered 2020-06-12: 10 mg via INTRAVENOUS
  Filled 2020-06-12: qty 5

## 2020-06-12 NOTE — Progress Notes (Signed)
Occupational Therapy Treatment Patient Details Name: MARVELYN BOUCHILLON MRN: [id number] DOB: Apr 04, 1941 Today's Date: 06/12/2020    History of present illness SHAHED YEOMAN is a 79 y.o. female with medical history significant for prior CVA, chronic diastolic CHF, coronary artery disease status post PCI, obesity, essential hypertension, type 2 diabetes, peripheral neuropathy, gout, chronic anxiety/depression, seizure disorder, GERD, asthma, memory deficit/not officially diagnosed with dementia, lupus, LV apical thrombus completed 3 months of anticoagulation, COVID-19 viral infection March 2021, who presented to [facility] ED from home due to worsening altered mental status.  History is obtained from [place], her daughter at bedside, and review of medical records.  Patient was seen in the ED few days prior to this presentation for the same, was found to have a UTI and was sent home on Keflex.  Per daughter she has had worsening confusion.  She had a fall today was possibly down for more than an hour.  CT head and cervical spine nonacute.  Chest x-ray consistent with pulmonary edema.  Work-up in the ED revealed AKI.   OT comments  Patient supine in bed, pulling off mitts upon entry into room; verbalizing need to use restroom.  Assisted to EOB with mod assist and completed stand pivot to [facility] with mod assist, total assist for toileting after small +BM.  Returned to EOB, but with increasingly difficulty following commands and max cueing for safety due to impulsivity as session progressed.  Requires +2 max assist to return supine. SNF remains appropriate. Will follow acutely.    Follow Up Recommendations  SNF;Supervision/Assistance - 24 hour    Equipment Recommendations  Other (comment) (TBD at next venue of care)    Recommendations for Other Services      Precautions / Restrictions Precautions Precautions: Fall Restrictions Weight Bearing Restrictions: No       Mobility Bed Mobility Overal bed mobility: Needs  Assistance Bed Mobility: Supine to Sit;Sit to Supine     Supine to sit: Mod assist Sit to supine: Max assist;+2 for physical assistance   General bed mobility comments: mod assist for trunk rise and LE translation to EOB, boost to EOB with assist of bed pads. Max +2 to return to supine, pt initally resisting returning to bed but requires trunk and LB support once agreeable  Transfers Overall transfer level: Needs assistance Equipment used: Rolling walker (2 wheeled) Transfers: Sit to/from Stand Sit to Stand: Mod assist Stand pivot transfers: Mod assist       General transfer comment: mod assist to power up and steady from EOB and BSC, with cueing for hand placement and safety    Balance Overall balance assessment: Needs assistance Sitting-balance support: Bilateral upper extremity supported;Feet supported Sitting balance-Leahy Scale: Fair     Standing balance support: Bilateral upper extremity supported Standing balance-Leahy Scale: Poor Standing balance comment: reliant on UE and external support                           ADL either performed or assessed with clinical judgement   ADL Overall ADL's : Needs assistance/impaired                         Toilet Transfer: Moderate assistance;Stand-pivot;BSC;RW Toilet Transfer Details (indicate cue type and reason): to/from [facility] Toileting- Clothing Manipulation and Hygiene: Total assistance;Sit to/from stand Toileting - Clothing Manipulation Details (indicate cue type and reason): +BM, assist for hygiene and clothing mgmt     Functional  mobility during ADLs: Rolling walker;Cueing for safety;Moderate assistance       Vision       Perception     Praxis      Cognition Arousal/Alertness: Awake/alert Behavior During Therapy: WFL for tasks assessed/performed Overall Cognitive Status: Impaired/Different from baseline Area of Impairment: Following commands;Problem  solving;Attention;Memory;Safety/judgement;Awareness                   Current Attention Level: Focused Memory: Decreased short-term memory;Decreased recall of precautions Following Commands: Follows one step commands inconsistently;Follows one step commands with increased time Safety/Judgement: Decreased awareness of safety;Decreased awareness of deficits Awareness: Emergent Problem Solving: Decreased initiation;Slow processing;Difficulty sequencing;Requires verbal cues;Requires tactile cues General Comments: patient pulling off mitts upon entry into room, patient aware needing to have BM but presents with difficulty following commands with poor awareness to safety        Exercises     Shoulder Instructions       General Comments on 2L O2    Pertinent Vitals/ Pain       Pain Assessment: Faces Faces Pain Scale: Hurts a little bit Pain Location: generalized Pain Descriptors / Indicators: Discomfort;Sore Pain Intervention(s): Limited activity within patient's tolerance;Monitored during session;Repositioned  Home Living                                          Prior Functioning/Environment              Frequency  Min 2X/week        Progress Toward Goals  OT Goals(current goals can now be found in the care plan section)  Progress towards OT goals: Progressing toward goals  Acute Rehab OT Goals Patient Stated Goal: none stated today OT Goal Formulation: Patient unable to participate in goal setting  Plan Discharge plan remains appropriate;Frequency remains appropriate    Co-evaluation                 AM-PAC OT "6 Clicks" Daily Activity     Outcome Measure   Help from another person eating meals?: None Help from another person taking care of personal grooming?: A Little Help from another person toileting, which includes using toliet, bedpan, or urinal?: Total Help from another person bathing (including washing, rinsing, drying)?:  A Lot Help from another person to put on and taking off regular upper body clothing?: A Little Help from another person to put on and taking off regular lower body clothing?: Total 6 Click Score: 14    End of Session Equipment Utilized During Treatment: Gait belt;Rolling walker  OT Visit Diagnosis: Unsteadiness on feet (R26.81);Other abnormalities of gait and mobility (R26.89);Other symptoms and signs involving cognitive function;Muscle weakness (generalized) (M62.81)   Activity Tolerance Patient tolerated treatment well   Patient Left in bed;with call bell/phone within reach;with bed alarm set;with family/visitor present;with restraints reapplied;with nursing/sitter in room   Nurse Communication Mobility status        Time: 4132-4401 OT Time Calculation (min): 24 min  Charges: OT General Charges $OT Visit: 1 Visit OT Treatments $Self Care/Home Management : 23-37 mins  [place] Pager [phone] Office [place] 06/12/2020, 10:38 AM

## 2020-06-12 NOTE — TOC Progression Note (Signed)
Transition of Care [facility]) - Progression Note    Patient Details  Name: Brittany Roberts MRN: [id number] Date of Birth: 13-Aug-1940  Transition of Care [facility]) CM/SW Contact  Sharlet Salina Mila Homer, LCSW Phone Number: 06/12/2020, 6:10 PM  Clinical Narrative: Visited room and talked with daughter Roiza Wiedel regarding SNF responses. The patient was asleep. Daughter had been provided with the initial 3 facility responses previously and requested additional facilities be contacted. Daughter advised no new bed offers and informed her of the facilities that declined. Ms. Homewood indicated that her SNF choice is [facility].  CSW will follow-up with [facility] on 12/10.Marland Kitchen       Expected Discharge Plan: [place] Barriers to Discharge: Continued Medical Work up  Expected Discharge Plan and Services Expected Discharge Plan: [place] In-house Referral: Clinical Social Work   Post Acute Care Choice: [place] Living arrangements for the past 2 months: Apartment                                       Social Determinants of Health (SDOH) Interventions    Readmission Risk Interventions No flowsheet data found.

## 2020-06-12 NOTE — Progress Notes (Signed)
This RN made aware by CCMD that patient was sustaining HR between 120-140's, highest was in the 160's. MD notified.  IV Cardizem 10mg  x1 and Metoprolol 25mg  PO x1 was ordered.  Will continue to monitor.

## 2020-06-12 NOTE — Progress Notes (Signed)
PROGRESS NOTE    DELIYAH Roberts  MWN:[id number] DOB: [birth year] DOA: 06/07/2020 PCP: [place], [name], MD    Brief Narrative:  Brittany Roberts is an 79 year old female with past medical history significant for CVA, chronic diastolic congestive heart failure, CAD s/p PCI, obesity, essential hypertension, type 2 diabetes mellitus, peripheral neuropathy, gout, chronic anxiety/depression, seizure disorder, GERD, asthma, memory deficits, lupus, history of LV apical thrombus status post 3 months of anticoagulation, Covid-19 viral infection March 2021 who presented to [facility] ED due to progressive confusion.  Due to mental status, history obtained from EDP, daughter at bedside and review of medical records.  Patient was previously in the ED few days prior and diagnosed with a UTI and sent home on Keflex.  Per daughter, confusion progressed leading to fall with unknown prolonged downtime.  Patient was found to have acute renal failure and A. fib which is a new diagnosis.  Hospitalist service consulted for further evaluation and management.   Assessment & Plan:   Active Problems:   Chronic kidney disease, stage III (moderate) (HCC)   Chronic respiratory failure with hypoxia, on home O2 therapy (HCC)   AMS (altered mental status)   Atrial fibrillation with RVR (HCC)   Acute metabolic encephalopathy, POA Urinary tract infection Patient presenting with progressive confusion, recently diagnosed with UTI and discharged home on Keflex.  No urine cultures obtained.  CT head with no acute intracranial findings, stable encephalomalacia right frontal lobe from prior infarct and stable atherosclerosis.  UDS negative.  Covid-19 PCR/influenza A/B negative.  Urine culture obtained 06/07/2020 with no growth.  Completed 3-day course of ceftriaxone.  Suspect underlying dementia with likely hospital-acquired delirium as contributing factors. --Blood culture 12/5: No growth x 4 days --MRI brain w/o contrast pending; given  persistent encephalopathy  Paroxysmal atrial fibrillation, new diagnosis Patient was noted to be in atrial fibrillation on presentation.  No known prior history but patient did have recent apical thrombus and was treated with Coumadin by cardiology, Dr. Abby Potash for 3 months.  TSH within normal limits. --Eliquis 2.5 mg p.o. twice daily --restarted home metoprolol succinate at reduced dose of 25 mg p.o. daily --Monitor on telemetry  Chronic hypoxic respiratory failure On 3 L nasal cannula at baseline.  Continue Dulera 100-5 mcg 2 puffs p.o. twice daily (substituted for Symbicort), montelukast 10 mg p.o. daily.  Chronic diastolic congestive heart failure TTE with LVEF 55-60%, no regional LV wall motion abnormalities, indeterminate LV diastolic parameters, IVC normal in size.  On metoprolol succinate 50 mg p.o. daily, indoor 30 mg p.o. daily, torsemide 20 mg p.o. twice daily and Lipitor at home. --Resume metoprolol succinate at reduced dose 25 mg p.o. daily --Continue Lipitor 20 mg p.o. daily --Strict I's and O's and daily weights  Acute renal failure on CKD stage IIIb Baseline creatinine 1.6 with a GFR of 33.  Patient presented with a creatinine of 3.17 with a GFR of 14.  Etiology likely prerenal dehydration from poor oral intake in the preceding days prior to hospitalization.  Renal ultrasound with findings suggestive of medical renal disease and distended urinary bladder. --Cr 3.17>2.53>2.48>2.45>2.07>1.53 --Continue NS at 50 mL's per hour --Avoid nephrotoxins, renally dose all medications --Continue monitor BMP daily  Type 2 diabetes mellitus Hemoglobin A1c 8.5, not optimally controlled. --Lantus 10 units subcutaneously daily --Insulin sliding scale for further coverage --CBGs before every meal/at bedtime  Elevated calcium Etiology likely secondary to dehydration.  PTH intact low and vitamin D within normal limits. --Continue IV fluid hydration  Seizure disorder --Keppra 500 mg  p.o. twice daily  Anxiety/depression Peripheral neuropathy --Cymbalta 60 mg p.o. daily --Gabapentin 100 mg p.o. nightly  GERD: Continue PPI  Hx LV apical thrombus Completed 3 months of anticoagulation previously.  Started on Eliquis 2.5 mg p.o. twice daily for A. fib as above.  History of CVA/CAD Not on antiplatelet or statin.  Unsure if she has any contraindications or intolerance to these.  Outpatient follow-up.  Obesity Body mass index is 49.87 kg/m.  Needs aggressive lifestyle changes/weight loss as this complicates all facets of care.  History of Covid-19 viral infection March 2021    DVT prophylaxis: Eliquis Code Status: Full code Family Communication: No family present at bedside this morning, updated patient daughter Synetta Fail via telephone this afternoon.  Disposition Plan:  Status is: Inpatient  Remains inpatient appropriate because:Altered mental status, Unsafe d/c plan and Inpatient level of care appropriate due to severity of illness   Dispo:  Patient From: Home  Planned Disposition: Skilled Nursing Facility  Expected discharge date: 06/14/2020  Medically stable for discharge: No   Consultants:   none  Procedures:   TTE  Antimicrobials:   Ceftriaxone 12/4 - 12/7  Fosfomycin 12/4   Subjective: Patient seen and examined bedside, resting comfortably.  Continues to be confused.  No family present at bedside.  No specific complaints this morning.  Denies chest pain, no shortness of breath, no abdominal pain.  No acute concerns per nursing staff.  Objective: Vitals:   06/12/20 0500 06/12/20 0610 06/12/20 0851 06/12/20 0955  BP:  135/64  (!) 180/72  Pulse:  79  64  Resp:  17  20  Temp:  98.4 F (36.9 C)  99.4 F (37.4 C)  TempSrc:  Oral  Oral  SpO2:  99% 94% 95%  Weight: 129.7 kg       Intake/Output Summary (Last 24 hours) at 06/12/2020 1213 Last data filed at 06/12/2020 0950 Gross per 24 hour  Intake 1359.55 ml  Output 700 ml  Net 659.55  ml   Filed Weights   06/10/20 2026 06/11/20 2135 06/12/20 0500  Weight: 129.7 kg 129.7 kg 129.7 kg    Examination:  General exam: Appears calm and comfortable, confused Respiratory system: Clear to auscultation. Respiratory effort normal.  On 2 L nasal cannula (baseline 3L [place]) Cardiovascular system: S1 & S2 heard, RRR. No JVD, murmurs, rubs, gallops or clicks. No pedal edema. Gastrointestinal system: Abdomen is nondistended, soft and nontender. No organomegaly or masses felt. Normal bowel sounds heard. Central nervous system: Alert. No focal neurological deficits. Extremities: Symmetric 5 x 5 power. Skin: No rashes, lesions or ulcers Psychiatry: Judgement and insight appear poor.     Data Reviewed: I have personally reviewed following labs and imaging studies  CBC: Recent Labs  Lab 06/07/20 1458 06/08/20 0634 06/09/20 0228 06/10/20 0249 06/11/20 0428  WBC 14.0* 12.8* 9.7 10.2 10.9*  NEUTROABS 11.0* 8.9* 6.0 6.0  --   HGB 11.7* 11.0* 10.4* 9.5* 9.2*  HCT 34.9* 32.3* 30.8* 28.3* 26.5*  MCV 75.7* 75.8* 75.7* 75.7* 75.3*  PLT 179 157 171 159 154   Basic Metabolic Panel: Recent Labs  Lab 06/08/20 0445 06/09/20 0228 06/10/20 0249 06/11/20 0428 06/12/20 0109  NA 142 140 142 143 142  K 3.7 3.9 4.1 3.9 4.2  CL 103 105 106 109 109  CO2 27 25 27 25 26   GLUCOSE 200* 152* 170* 131* 151*  BUN 59* 54* 54* 48* 38*  CREATININE 2.53* 2.48* 2.45* 2.07* 1.53*  CALCIUM 11.2* 11.4* 11.6* 11.2* 11.2*  MG 1.9  --   --   --  1.8   GFR: Estimated Creatinine Clearance: 38.9 mL/min (A) (by C-G formula based on SCr of 1.53 mg/dL (H)). Liver Function Tests: Recent Labs  Lab 06/07/20 1458  AST 38  ALT 21  ALKPHOS 97  BILITOT 0.8  PROT 7.5  ALBUMIN 3.4*   No results for input(s): LIPASE, AMYLASE in the last 168 hours. Recent Labs  Lab 06/07/20 1520  AMMONIA <9*   Coagulation Profile: No results for input(s): INR, PROTIME in the last 168 hours. Cardiac Enzymes: Recent Labs   Lab 06/07/20 1458 06/08/20 0445  CKTOTAL 317* 301*   BNP (last 3 results) No results for input(s): PROBNP in the last 8760 hours. HbA1C: No results for input(s): HGBA1C in the last 72 hours. CBG: Recent Labs  Lab 06/11/20 1149 06/11/20 1619 06/11/20 2133 06/12/20 0640 06/12/20 1124  GLUCAP 184* 191* 152* 155* 160*   Lipid Profile: No results for input(s): CHOL, HDL, LDLCALC, TRIG, CHOLHDL, LDLDIRECT in the last 72 hours. Thyroid Function Tests: No results for input(s): TSH, T4TOTAL, FREET4, T3FREE, THYROIDAB in the last 72 hours. Anemia Panel: No results for input(s): VITAMINB12, FOLATE, FERRITIN, TIBC, IRON, RETICCTPCT in the last 72 hours. Sepsis Labs: No results for input(s): PROCALCITON, LATICACIDVEN in the last 168 hours.  Recent Results (from the past 240 hour(s))  Resp Panel by RT-PCR (Flu A&B, Covid) Nasopharyngeal Swab     Status: None   Collection Time: 06/07/20  4:20 PM   Specimen: Nasopharyngeal Swab; Nasopharyngeal(NP) swabs in vial transport medium  Result Value Ref Range Status   SARS Coronavirus 2 by RT PCR NEGATIVE NEGATIVE Final    Comment: (NOTE) SARS-CoV-2 target nucleic acids are NOT DETECTED.  The SARS-CoV-2 RNA is generally detectable in upper respiratory specimens during the acute phase of infection. The lowest concentration of SARS-CoV-2 viral copies this assay can detect is 138 copies/mL. A negative result does not preclude SARS-Cov-2 infection and should not be used as the sole basis for treatment or other patient management decisions. A negative result may occur with  improper specimen collection/handling, submission of specimen other than nasopharyngeal swab, presence of viral mutation(s) within the areas targeted by this assay, and inadequate number of viral copies(<138 copies/mL). A negative result must be combined with clinical observations, patient history, and epidemiological information. The expected result is Negative.  Fact  Sheet for Patients:  BloggerCourse.com  Fact Sheet for Healthcare Providers:  SeriousBroker.it  This test is no t yet approved or cleared by the [place] FDA and  has been authorized for detection and/or diagnosis of SARS-CoV-2 by FDA under an Emergency Use Authorization (EUA). This EUA will remain  in effect (meaning this test can be used) for the duration of the COVID-19 declaration under Section 564(b)(1) of the Act, 21 U.S.C.section 360bbb-3(b)(1), unless the authorization is terminated  or revoked sooner.       Influenza A by PCR NEGATIVE NEGATIVE Final   Influenza B by PCR NEGATIVE NEGATIVE Final    Comment: (NOTE) The Xpert Xpress SARS-CoV-2/FLU/RSV plus assay is intended as an aid in the diagnosis of influenza from Nasopharyngeal swab specimens and should not be used as a sole basis for treatment. Nasal washings and aspirates are unacceptable for Xpert Xpress SARS-CoV-2/FLU/RSV testing.  Fact Sheet for Patients: BloggerCourse.com  Fact Sheet for Healthcare Providers: SeriousBroker.it  This test is not yet approved or cleared by the [place] FDA and has been  authorized for detection and/or diagnosis of SARS-CoV-2 by FDA under an Emergency Use Authorization (EUA). This EUA will remain in effect (meaning this test can be used) for the duration of the COVID-19 declaration under Section 564(b)(1) of the Act, 21 U.S.C. section 360bbb-3(b)(1), unless the authorization is terminated or revoked.  Performed at [facility], [address] [place], [place] [address]   Urine culture     Status: None   Collection Time: 06/07/20  6:16 PM   Specimen: Urine, Random  Result Value Ref Range Status   Specimen Description URINE, RANDOM  Final   Special Requests NONE  Final   Culture   Final    NO GROWTH Performed at [facility], [address],  [place], [place] [address]    Report Status 06/09/2020 FINAL  Final  Culture, blood (routine x 2)     Status: None (Preliminary result)   Collection Time: 06/08/20  4:46 AM   Specimen: BLOOD LEFT HAND  Result Value Ref Range Status   Specimen Description BLOOD LEFT HAND  Final   Special Requests   Final    BOTTLES DRAWN AEROBIC ONLY Blood Culture results may not be optimal due to an inadequate volume of blood received in culture bottles   Culture   Final    NO GROWTH 4 DAYS Performed at [facility], [address], [place], [place] [address]    Report Status PENDING  Incomplete         Radiology Studies: No results found.      Scheduled Meds: . allopurinol  200 mg Oral Daily  . apixaban  2.5 mg Oral BID  . atorvastatin  20 mg Oral Daily  . DULoxetine  60 mg Oral Daily  . ferrous sulfate  325 mg Oral BID WC  . gabapentin  100 mg Oral QHS  . insulin aspart  0-5 Units Subcutaneous QHS  . insulin aspart  0-9 Units Subcutaneous TID WC  . insulin glargine  10 Units Subcutaneous Daily  . levETIRAcetam  500 mg Oral BID  . LORazepam  1 mg Intravenous On Call  . metoprolol succinate  25 mg Oral Daily  . mometasone-formoterol  2 puff Inhalation BID  . montelukast  10 mg Oral QHS  . pantoprazole  40 mg Oral Daily   Continuous Infusions: . sodium chloride 50 mL/hr at 06/12/20 0047     LOS: 5 days    Time spent: 38 minutes spent on chart review, discussion with nursing staff, consultants, updating family and interview/physical exam; more than 50% of that time was spent in counseling and/or coordination of care.    Alvira Philips [place], DO [facility] Available via Epic secure chat 7am-7pm After these hours, please refer to coverage provider listed on [email or web address] 06/12/2020, 12:13 PM

## 2020-06-12 NOTE — Care Management Important Message (Signed)
Important Message  Patient Details  Name: Brittany Roberts MRN: [id number] Date of Birth: 11/03/1940   Medicare Important Message Given:  Yes     [name] [name] 06/12/2020, 2:02 PM

## 2020-06-13 LAB — CULTURE, BLOOD (ROUTINE X 2): Culture: NO GROWTH

## 2020-06-13 LAB — GLUCOSE, CAPILLARY
Glucose-Capillary: 148 mg/dL — ABNORMAL HIGH (ref 70–99)
Glucose-Capillary: 187 mg/dL — ABNORMAL HIGH (ref 70–99)
Glucose-Capillary: 184 mg/dL — ABNORMAL HIGH (ref 70–99)

## 2020-06-13 LAB — BASIC METABOLIC PANEL
Anion gap: 10 (ref 5–15)
BUN: 29 mg/dL — ABNORMAL HIGH (ref 8–23)
CO2: 24 mmol/L (ref 22–32)
Calcium: 10.7 mg/dL — ABNORMAL HIGH (ref 8.9–10.3)
Chloride: 110 mmol/L (ref 98–111)
Creatinine, Ser: 1.59 mg/dL — ABNORMAL HIGH (ref 0.44–1.00)
GFR, Estimated: 33 mL/min — ABNORMAL LOW (ref >60)
Glucose, Bld: 167 mg/dL — ABNORMAL HIGH (ref 70–99)
Potassium: 4 mmol/L (ref 3.5–5.1)
Sodium: 144 mmol/L (ref 135–145)

## 2020-06-13 LAB — MAGNESIUM: Magnesium: 1.6 mg/dL — ABNORMAL LOW (ref 1.7–2.4)

## 2020-06-13 LAB — SARS CORONAVIRUS 2 BY RT PCR (HOSPITAL ORDER, PERFORMED IN ~~LOC~~ HOSPITAL LAB): SARS Coronavirus 2: NEGATIVE

## 2020-06-13 MED ORDER — APIXABAN 2.5 MG PO TABS: 2.5000 mg | ORAL_TABLET | Freq: Two times a day (BID) | ORAL | Status: DC

## 2020-06-13 MED ORDER — INSULIN GLARGINE 100 UNIT/ML ~~LOC~~ SOLN: 10.0000 [IU] | Freq: Every day | SUBCUTANEOUS | 11 refills | Status: DC

## 2020-06-13 MED ORDER — GABAPENTIN 100 MG PO CAPS: 100.0000 mg | ORAL_CAPSULE | Freq: Every day | ORAL | Status: DC

## 2020-06-13 NOTE — Consult Note (Signed)
   [facility] CM Inpatient Consult   06/13/2020  TARINA VOLK [birth year] [phone]   Patient chart reviewed for potential [facility] CM) services due to high risk score for unplanned readmission.  Per chart review, current disposition plan is for skilled nursing facility. No [facility] CM needs at this time.  Of note, [facility] Care Management services does not replace or interfere with any services that are arranged by inpatient case management or social work.  Netta Cedars, MSN, [place] [facility] Liaison Nurse Mobile Phone [phone]  Toll free office [phone]

## 2020-06-13 NOTE — Progress Notes (Addendum)
PROGRESS NOTE    [name]  ZOX:[id number] DOB: Nov 08, 1940 DOA: 06/07/2020 PCP: [place], [name], MD    Brief Narrative:  Brittany Roberts is an 79 year old female with past medical history significant for CVA, chronic diastolic congestive heart failure, CAD s/p PCI, obesity, essential hypertension, type 2 diabetes mellitus, peripheral neuropathy, gout, chronic anxiety/depression, seizure disorder, GERD, asthma, memory deficits, lupus, history of LV apical thrombus status post 3 months of anticoagulation, Covid-19 viral infection March 2021 who presented to [facility] ED due to progressive confusion.  Due to mental status, history obtained from EDP, daughter at bedside and review of medical records.  Patient was previously in the ED few days prior and diagnosed with a UTI and sent home on Keflex.  Per daughter, confusion progressed leading to fall with unknown prolonged downtime.  Patient was found to have acute renal failure and A. fib which is a new diagnosis.  Hospitalist service consulted for further evaluation and management.   Assessment & Plan:   Active Problems:   Chronic kidney disease, stage III (moderate) (HCC)   Chronic respiratory failure with hypoxia, on home O2 therapy (HCC)   AMS (altered mental status)   Atrial fibrillation with RVR (HCC)   Acute metabolic encephalopathy, POA Urinary tract infection Patient presenting with progressive confusion, recently diagnosed with UTI and discharged home on Keflex.  No urine cultures obtained.  CT head with no acute intracranial findings, stable encephalomalacia right frontal lobe from prior infarct and stable atherosclerosis.  UDS negative.  Covid-19 PCR/influenza A/B negative.  Urine culture obtained 06/07/2020 with no growth.  Completed 3-day course of ceftriaxone.  Suspect underlying dementia with likely hospital-acquired delirium as contributing factors.  Blood culture 12/5 with no growth x 5 days.  MRI brain w/o contrast with no acute  findings.  Paroxysmal atrial fibrillation, new diagnosis Patient was noted to be in atrial fibrillation on presentation.  No known prior history but patient did have recent apical thrombus and was treated with Coumadin by cardiology, Dr. Abby Potash for 3 months.  TSH within normal limits. --Eliquis 2.5 mg p.o. twice daily --restarted home metoprolol succinate at reduced dose of 25 mg p.o. daily --Monitor on telemetry  Chronic hypoxic respiratory failure On 3 L nasal cannula at baseline.  Continue Dulera 100-5 mcg 2 puffs p.o. twice daily (substituted for Symbicort), montelukast 10 mg p.o. daily.  Chronic diastolic congestive heart failure TTE with LVEF 55-60%, no regional LV wall motion abnormalities, indeterminate LV diastolic parameters, IVC normal in size.  On metoprolol succinate 50 mg p.o. daily, indoor 30 mg p.o. daily, torsemide 20 mg p.o. twice daily and Lipitor at home. --Resume metoprolol succinate at reduced dose 25 mg p.o. daily --Continue Lipitor 20 mg p.o. daily --Strict I's and O's and daily weights  Acute renal failure on CKD stage IIIb Baseline creatinine 1.6 with a GFR of 33.  Patient presented with a creatinine of 3.17 with a GFR of 14.  Etiology likely prerenal dehydration from poor oral intake in the preceding days prior to hospitalization.  Renal ultrasound with findings suggestive of medical renal disease and distended urinary bladder. --Cr 3.17>2.53>2.48>2.45>2.07>1.53>1.59 --Continue NS at 50 mL's per hour --Avoid nephrotoxins, renally dose all medications --Continue monitor BMP daily  Type 2 diabetes mellitus Hemoglobin A1c 8.5, not optimally controlled. --Lantus 10 units subcutaneously daily --Insulin sliding scale for further coverage --CBGs before every meal/at bedtime  Elevated calcium Etiology likely secondary to dehydration.  PTH intact low and vitamin D within normal limits. --Continue IV  fluid hydration  Seizure disorder --Keppra 500 mg p.o. twice  daily  Anxiety/depression Peripheral neuropathy --Cymbalta 60 mg p.o. daily --Gabapentin 100 mg p.o. nightly  GERD: Continue PPI  Hx LV apical thrombus Completed 3 months of anticoagulation previously.  Started on Eliquis 2.5 mg p.o. twice daily for A. fib as above.  History of CVA/CAD Not on antiplatelet or statin.  Unsure if she has any contraindications or intolerance to these.  Outpatient follow-up.  Obesity Body mass index is 49 kg/m.  Needs aggressive lifestyle changes/weight loss as this complicates all facets of care.  History of Covid-19 viral infection March 2021    DVT prophylaxis: Eliquis Code Status: Full code Family Communication: No family present at bedside this morning, updated patient daughter present at bedside this morning  Disposition Plan:  Status is: Inpatient  Remains inpatient appropriate because:Altered mental status, Unsafe d/c plan and Inpatient level of care appropriate due to severity of illness   Dispo:  Patient From: Home  Planned Disposition: Skilled Nursing Facility  Expected discharge date: 06/13/2020  Medically stable for discharge: Yes   Consultants:   none  Procedures:   TTE  Antimicrobials:   Ceftriaxone 12/4 - 12/7  Fosfomycin 12/4   Subjective: Patient seen and examined bedside, resting comfortably.  Continues to be confused.  Daughter present at bedside and updated.  Awaiting SNF bed.  No specific complaints this morning.  Denies chest pain, no shortness of breath, no abdominal pain.  No acute concerns per nursing staff.  Objective: Vitals:   06/12/20 2009 06/12/20 2021 06/13/20 0455 06/13/20 0945  BP:  (!) 152/47 (!) 145/56 (!) 149/90  Pulse:  91 90 70  Resp:  18 18 18   Temp:  98 F (36.7 C) 98 F (36.7 C) 98.4 F (36.9 C)  TempSrc:   Oral Axillary  SpO2: 94% 100% 100% 94%  Weight:  127.5 kg      Intake/Output Summary (Last 24 hours) at 06/13/2020 1513 Last data filed at 06/13/2020 0900 Gross per 24  hour  Intake 1497.55 ml  Output 350 ml  Net 1147.55 ml   Filed Weights   06/11/20 2135 06/12/20 0500 06/12/20 2021  Weight: 129.7 kg 129.7 kg 127.5 kg    Examination:  General exam: Appears calm and comfortable, confused Respiratory system: Clear to auscultation. Respiratory effort normal.  On 2 L nasal cannula (baseline 3L ) Cardiovascular system: S1 & S2 heard, RRR. No JVD, murmurs, rubs, gallops or clicks. No pedal edema. Gastrointestinal system: Abdomen is nondistended, soft and nontender. No organomegaly or masses felt. Normal bowel sounds heard. Central nervous system: Alert. No focal neurological deficits. Extremities: Symmetric 5 x 5 power. Skin: No rashes, lesions or ulcers Psychiatry: Judgement and insight appear poor.     Data Reviewed: I have personally reviewed following labs and imaging studies  CBC: Recent Labs  Lab 06/07/20 1458 06/08/20 0634 06/09/20 0228 06/10/20 0249 06/11/20 0428  WBC 14.0* 12.8* 9.7 10.2 10.9*  NEUTROABS 11.0* 8.9* 6.0 6.0  --   HGB 11.7* 11.0* 10.4* 9.5* 9.2*  HCT 34.9* 32.3* 30.8* 28.3* 26.5*  MCV 75.7* 75.8* 75.7* 75.7* 75.3*  PLT 179 157 171 159 154   Basic Metabolic Panel: Recent Labs  Lab 06/08/20 0445 06/09/20 0228 06/10/20 0249 06/11/20 0428 06/12/20 0109 06/13/20 0239  NA 142 140 142 143 142 144  K 3.7 3.9 4.1 3.9 4.2 4.0  CL 103 105 106 109 109 110  CO2 27 25 27 25 26 24   GLUCOSE 200*  152* 170* 131* 151* 167*  BUN 59* 54* 54* 48* 38* 29*  CREATININE 2.53* 2.48* 2.45* 2.07* 1.53* 1.59*  CALCIUM 11.2* 11.4* 11.6* 11.2* 11.2* 10.7*  MG 1.9  --   --   --  1.8 1.6*   GFR: Estimated Creatinine Clearance: 37.1 mL/min (A) (by C-G formula based on SCr of 1.59 mg/dL (H)). Liver Function Tests: Recent Labs  Lab 06/07/20 1458  AST 38  ALT 21  ALKPHOS 97  BILITOT 0.8  PROT 7.5  ALBUMIN 3.4*   No results for input(s): LIPASE, AMYLASE in the last 168 hours. Recent Labs  Lab 06/07/20 1520  AMMONIA <9*    Coagulation Profile: No results for input(s): INR, PROTIME in the last 168 hours. Cardiac Enzymes: Recent Labs  Lab 06/07/20 1458 06/08/20 0445  CKTOTAL 317* 301*   BNP (last 3 results) No results for input(s): PROBNP in the last 8760 hours. HbA1C: No results for input(s): HGBA1C in the last 72 hours. CBG: Recent Labs  Lab 06/12/20 0640 06/12/20 1124 06/12/20 1712 06/12/20 2148 06/13/20 0631  GLUCAP 155* 160* 147* 141* 148*   Lipid Profile: No results for input(s): CHOL, HDL, LDLCALC, TRIG, CHOLHDL, LDLDIRECT in the last 72 hours. Thyroid Function Tests: No results for input(s): TSH, T4TOTAL, FREET4, T3FREE, THYROIDAB in the last 72 hours. Anemia Panel: No results for input(s): VITAMINB12, FOLATE, FERRITIN, TIBC, IRON, RETICCTPCT in the last 72 hours. Sepsis Labs: No results for input(s): PROCALCITON, LATICACIDVEN in the last 168 hours.  Recent Results (from the past 240 hour(s))  Resp Panel by RT-PCR (Flu A&B, Covid) Nasopharyngeal Swab     Status: None   Collection Time: 06/07/20  4:20 PM   Specimen: Nasopharyngeal Swab; Nasopharyngeal(NP) swabs in vial transport medium  Result Value Ref Range Status   SARS Coronavirus 2 by RT PCR NEGATIVE NEGATIVE Final    Comment: (NOTE) SARS-CoV-2 target nucleic acids are NOT DETECTED.  The SARS-CoV-2 RNA is generally detectable in upper respiratory specimens during the acute phase of infection. The lowest concentration of SARS-CoV-2 viral copies this assay can detect is 138 copies/mL. A negative result does not preclude SARS-Cov-2 infection and should not be used as the sole basis for treatment or other patient management decisions. A negative result may occur with  improper specimen collection/handling, submission of specimen other than nasopharyngeal swab, presence of viral mutation(s) within the areas targeted by this assay, and inadequate number of viral copies(<138 copies/mL). A negative result must be combined  with clinical observations, patient history, and epidemiological information. The expected result is Negative.  Fact Sheet for Patients:  BloggerCourse.com  Fact Sheet for Healthcare Providers:  SeriousBroker.it  This test is no t yet approved or cleared by the [place] FDA and  has been authorized for detection and/or diagnosis of SARS-CoV-2 by FDA under an Emergency Use Authorization (EUA). This EUA will remain  in effect (meaning this test can be used) for the duration of the COVID-19 declaration under Section 564(b)(1) of the Act, 21 U.S.C.section 360bbb-3(b)(1), unless the authorization is terminated  or revoked sooner.       Influenza A by PCR NEGATIVE NEGATIVE Final   Influenza B by PCR NEGATIVE NEGATIVE Final    Comment: (NOTE) The Xpert Xpress SARS-CoV-2/FLU/RSV plus assay is intended as an aid in the diagnosis of influenza from Nasopharyngeal swab specimens and should not be used as a sole basis for treatment. Nasal washings and aspirates are unacceptable for Xpert Xpress SARS-CoV-2/FLU/RSV testing.  Fact Sheet for Patients: BloggerCourse.com  Fact Sheet for Healthcare Providers: SeriousBroker.it  This test is not yet approved or cleared by the [place] FDA and has been authorized for detection and/or diagnosis of SARS-CoV-2 by FDA under an Emergency Use Authorization (EUA). This EUA will remain in effect (meaning this test can be used) for the duration of the COVID-19 declaration under Section 564(b)(1) of the Act, 21 U.S.C. section 360bbb-3(b)(1), unless the authorization is terminated or revoked.  Performed at [facility], [address], [place], [place] [address]   Urine culture     Status: None   Collection Time: 06/07/20  6:16 PM   Specimen: Urine, Random  Result Value Ref Range Status   Specimen Description URINE, RANDOM  Final    Special Requests NONE  Final   Culture   Final    NO GROWTH Performed at [facility], [address] [place], [place] [address]    Report Status 06/09/2020 FINAL  Final  Culture, blood (routine x 2)     Status: None   Collection Time: 06/08/20  4:46 AM   Specimen: BLOOD LEFT HAND  Result Value Ref Range Status   Specimen Description BLOOD LEFT HAND  Final   Special Requests   Final    BOTTLES DRAWN AEROBIC ONLY Blood Culture results may not be optimal due to an inadequate volume of blood received in culture bottles   Culture   Final    NO GROWTH 5 DAYS Performed at [facility], [address] [place], [place] [address]    Report Status 06/13/2020 FINAL  Final         Radiology Studies: MR BRAIN WO CONTRAST  Result Date: 06/12/2020 CLINICAL DATA:  Delirium. EXAM: MRI HEAD WITHOUT CONTRAST TECHNIQUE: Multiplanar, multiecho pulse sequences of the brain and surrounding structures were obtained without intravenous contrast. COMPARISON:  Head CT June 07, 2020. FINDINGS: Incomplete and limited study due to patient inability to lie still in the scanner for the duration of the study. Only diffusion-weighted sequences were obtained which are degraded by motion. No large acute territorial infarct identified. Chronic infarct in the right frontal lobe. IMPRESSION: 1. Incomplete and limited study due to patient inability to lie still in the scanner for the duration of the study. 2. No large acute territorial infarct identified in this very limited study. 3. Chronic infarct in the right frontal lobe. Electronically Signed   By: Baldemar Lenis M.D.   On: 06/12/2020 14:13        Scheduled Meds: . allopurinol  200 mg Oral Daily  . apixaban  2.5 mg Oral BID  . atorvastatin  20 mg Oral Daily  . DULoxetine  60 mg Oral Daily  . ferrous sulfate  325 mg Oral BID WC  . gabapentin  100 mg Oral QHS  . insulin aspart  0-5 Units Subcutaneous QHS  . insulin aspart  0-9  Units Subcutaneous TID WC  . insulin glargine  10 Units Subcutaneous Daily  . levETIRAcetam  500 mg Oral BID  . metoprolol succinate  50 mg Oral Daily  . mometasone-formoterol  2 puff Inhalation BID  . montelukast  10 mg Oral QHS  . pantoprazole  40 mg Oral Daily   Continuous Infusions: . sodium chloride 50 mL/hr at 06/13/20 0147     LOS: 6 days    Time spent: 34 minutes spent on chart review, discussion with nursing staff, consultants, updating family and interview/physical exam; more than 50% of that time  was spent in counseling and/or coordination of care.    Alvira Philips [place], DO [facility] Available via Epic secure chat 7am-7pm After these hours, please refer to coverage provider listed on [email or web address] 06/13/2020, 3:13 PM

## 2020-06-13 NOTE — TOC Progression Note (Cosign Needed Addendum)
Transition of Care [facility]) - Progression Note    Patient Details  Name: Brittany Roberts MRN: [id number] Date of Birth: Jan 04, 1941  Transition [facility]) CM/SW Contact  Sharlet Salina Mila Homer, LCSW Phone Number: 06/13/2020, 5:00 PM  Clinical Narrative:  Patient was to discharge to [facility] today. CSW received a call from [place], admissions director advising CSW that discharge has been cancelled by the administrator after he talked with the daughter and information was given regarding patient's behaviors. Updated charge nurse,  patient's bedside nurse and MD regarding cancellation of discharge to [facility]. [facility] contacted and transport cancelled.  5:09 pm: Daughter [name] [phone]) contacted regarding cancellation of discharge. MS. Owens Shark explained that while at [facility] she asked the administrator if there was a protocol in place if her mom tries to get out of bed. Daughter added that she advised them that her mom has had some agitation and has been trying to get out of bed, and has pulled her purwic and IV out. She clarified with CSW that the nurses have used the term "agitation" regarding her mother. Ms. Owens Shark added that the administrator told her that if her mother's agitation clears, she can discharge to their facility. Daughter asked CSW if they could be with her mother at night at a nursing facility and was advised that the facility would have to be asked that question.  Daughter also expressed concern regarding her mom's heart rate being monitored. CSW expressed to daughter the appropriateness of her concern regarding her mother and the conversation she had with the administrator and informed her that the attending would be updated.  5:26 pm: Received call from MD and update provided regarding daughter's conversation with the administrator at [facility] and MD expressed understanding of daughter's concerns. Per MD, the patient will remain in the hospital through the  weekend.  CSW will continue to follow, provide SW intervention services as needed and assist with discharge of patient to a skilled nursing facility when determined appropriate by MD.       Expected Discharge Plan: [place] Barriers to Discharge: Continued Medical Work up  Expected Discharge Plan and Services Expected Discharge Plan: [place] In-house Referral: Clinical Social Work   Post Acute Care Choice: [place] Living arrangements for the past 2 months: Apartment Expected Discharge Date: 06/13/20                                     Social Determinants of Health (SDOH) Interventions    Readmission Risk Interventions No flowsheet data found.

## 2020-06-13 NOTE — Discharge Summary (Addendum)
Physician Discharge Summary  Brittany Roberts GYF:[id number] DOB: 1941-05-14 DOA: 06/07/2020  PCP: [name], [name], MD  Admit date: 06/07/2020 Discharge date: 06/17/2020  Admitted From: Home  Disposition: [facility]   Recommendations for Outpatient Follow-up:  1. Follow up with PCP in 1-2 weeks 2. Follow-up with cardiology, Dr. Earnie Larsson in 2-3 weeks 3. Discontinued home NPH in favor of Lantus 14 units apparently daily, continue to monitor blood sugar adjust accordingly 4. Discontinued home torsemide, continue to monitor daily weights and reassess if needs to restart diuretic 5. Discontinue Linzess 6. Started on Eliquis for new onset A. fib 7. Please obtain BMP in one week 8. Consider outpatient palliative care to follow-up for adult failure to thrive  Discharge Condition: Stable CODE STATUS: DNR Diet recommendation: Heart healthy/consistent carbohydrate diet  History of present illness:  Brittany Roberts is an 79 year old female with past medical history significant for CVA, chronic diastolic congestive heart failure, CAD s/p PCI, obesity, essential hypertension, type 2 diabetes mellitus, peripheral neuropathy, gout, chronic anxiety/depression, seizure disorder, GERD, asthma, memory deficits, lupus, history of LV apical thrombus status post 3 months of anticoagulation, Covid-19 viral infection March 2021 who presented to [facility] ED due to progressive confusion.  Due to mental status, history obtained from EDP, daughter at bedside and review of medical records.  Patient was previously in the ED few days prior and diagnosed with a UTI and sent home on Keflex.  Per daughter, confusion progressed leading to fall with unknown prolonged downtime.  Patient was found to have acute renal failure and A. fib which is a new diagnosis.  Hospitalist service consulted for further evaluation and management.  Hospital course:  Acute metabolic encephalopathy, POA Adult failure to Thrive Urinary tract  infection Patient presenting with progressive confusion, recently diagnosed with UTI and discharged home on Keflex.  No urine cultures obtained.  CT head with no acute intracranial findings, stable encephalomalacia right frontal lobe from prior infarct and stable atherosclerosis.  UDS negative.  Covid-19 PCR/influenza A/B negative.  Urine culture obtained 06/07/2020 with no growth.  Completed 3-day course of ceftriaxone. Blood culture 12/5 with no growth x 5 days.  MRI brain w/o contrast with no acute findings. Suspect underlying dementia with likely hospital-acquired delirium as contributing factors.  Melatonin 3 mg p.o. nightly.  Paroxysmal atrial fibrillation, new diagnosis Patient was noted to be in atrial fibrillation on presentation.  No known prior history but patient did have recent apical thrombus and was treated with Coumadin by cardiology, Dr. Shanon Brow for 3 months.  TSH within normal limits. Eliquis 2.5 mg p.o. twice daily. Metoprolol succinate 11m PO daily.  Outpatient follow-up with cardiology  Chronic hypoxic respiratory failure On 3 L nasal cannula at baseline.  Continue Symbicort and montelukast 10 mg p.o. daily.  Chronic diastolic congestive heart failure TTE with LVEF 55-60%, no regional LV wall motion abnormalities, indeterminate LV diastolic parameters, IVC normal in size.  On metoprolol succinate 50 mg p.o. daily, imdurr 30 mg p.o. daily, torsemide 20 mg p.o. twice daily and Lipitor at home.  Discontinued home torsemide secondary to poor oral intake and acute renal failure.  Continue metoprolol succinate 50 mg p.o. daily, Imdur Lipitor.  Continue to monitor daily weights.  Need to assess if reinitiation of diuretic therapy warranted if weight gain.  Acute renal failure on CKD stage IIIb Baseline creatinine 1.6 with a GFR of 33.  Patient presented with a creatinine of 3.17 with a GFR of 14.  Etiology likely prerenal dehydration from  ECHOCARDIOGRAM REPORT   Patient Name:   Brittany Roberts Date of Exam: 06/08/2020 Medical Rec #:  [id number]   Height:       63.5 in Accession #:    [id number]  Weight:       274.1 lb Date of Birth:  1941-03-04   BSA:          2.224 m Patient Age:    63 years    BP:           114/60 mmHg Patient Gender: F           HR:           57 bpm. Exam Location:  Inpatient Procedure: 2D Echo Indications:     acute diastolic chf 979.89  History:         Patient has prior history of Echocardiogram examinations, most                  recent 12/06/2019. Cardiomegaly, CAD, Lupus. Chronic kidney                  disease. history of stroke., Signs/Symptoms:Altered Mental                  Status; Risk Factors:Diabetes, Dyslipidemia and Sleep Apnea.  Sonographer:     Johny Chess Referring Phys:  [id number] [place] Diagnosing Phys: Adrian Prows MD IMPRESSIONS  1. Indeterminate diastolic function due to MAC. Left ventricular ejection fraction, by estimation, is 55 to 60%. The left ventricle has normal function. The left ventricle has no regional wall motion abnormalities. There is moderate left ventricular hypertrophy. Left ventricular diastolic parameters are indeterminate.  2. Right  ventricular systolic function is normal. The right ventricular size is normal.  3. Left atrial size was severely dilated.  4. Grossly normal in size.  5. The mitral valve is degenerative. No evidence of mitral valve regurgitation. No evidence of mitral stenosis. Moderate to severe mitral annular calcification.  6. The aortic valve is calcified. Aortic valve regurgitation is not visualized. Mild aortic valve sclerosis is present, with no evidence of aortic valve stenosis.  7. The inferior vena cava is normal in size with greater than 50% respiratory variability, suggesting right atrial pressure of 3 mmHg. Comparison(s): No significant change from prior study. No significant change from 12/06/2019. FINDINGS  Left Ventricle: Indeterminate diastolic function due to MAC. Left ventricular ejection fraction, by estimation, is 55 to 60%. The left ventricle has normal function. The left ventricle has no regional wall motion abnormalities. The left ventricular internal cavity size was normal in size. There is moderate left ventricular hypertrophy. Left ventricular diastolic parameters are indeterminate. Right Ventricle: The right ventricular size is normal. No increase in right ventricular wall thickness. Right ventricular systolic function is normal. Left Atrium: Left atrial size was severely dilated. Right Atrium: Grossly normal in size. Right atrial size was not well visualized. Pericardium: There is no evidence of pericardial effusion. Mitral Valve: The mitral valve is degenerative in appearance. Moderate to severe mitral annular calcification. No evidence of mitral valve regurgitation. No evidence of mitral valve stenosis. Tricuspid Valve: The tricuspid valve is grossly normal. Tricuspid valve regurgitation is not demonstrated. No evidence of tricuspid stenosis. Aortic Valve: The aortic valve is calcified. Aortic valve regurgitation is not visualized. Mild aortic valve sclerosis is present, with no evidence of aortic  valve stenosis. Pulmonic Valve: The pulmonic valve was not well visualized. Pulmonic valve regurgitation is mild. Aorta: The aortic root is normal  ECHOCARDIOGRAM REPORT   Patient Name:   Brittany Roberts Date of Exam: 06/08/2020 Medical Rec #:  [id number]   Height:       63.5 in Accession #:    [id number]  Weight:       274.1 lb Date of Birth:  1941-03-04   BSA:          2.224 m Patient Age:    63 years    BP:           114/60 mmHg Patient Gender: F           HR:           57 bpm. Exam Location:  Inpatient Procedure: 2D Echo Indications:     acute diastolic chf 979.89  History:         Patient has prior history of Echocardiogram examinations, most                  recent 12/06/2019. Cardiomegaly, CAD, Lupus. Chronic kidney                  disease. history of stroke., Signs/Symptoms:Altered Mental                  Status; Risk Factors:Diabetes, Dyslipidemia and Sleep Apnea.  Sonographer:     Johny Chess Referring Phys:  [id number] [place] Diagnosing Phys: Adrian Prows MD IMPRESSIONS  1. Indeterminate diastolic function due to MAC. Left ventricular ejection fraction, by estimation, is 55 to 60%. The left ventricle has normal function. The left ventricle has no regional wall motion abnormalities. There is moderate left ventricular hypertrophy. Left ventricular diastolic parameters are indeterminate.  2. Right  ventricular systolic function is normal. The right ventricular size is normal.  3. Left atrial size was severely dilated.  4. Grossly normal in size.  5. The mitral valve is degenerative. No evidence of mitral valve regurgitation. No evidence of mitral stenosis. Moderate to severe mitral annular calcification.  6. The aortic valve is calcified. Aortic valve regurgitation is not visualized. Mild aortic valve sclerosis is present, with no evidence of aortic valve stenosis.  7. The inferior vena cava is normal in size with greater than 50% respiratory variability, suggesting right atrial pressure of 3 mmHg. Comparison(s): No significant change from prior study. No significant change from 12/06/2019. FINDINGS  Left Ventricle: Indeterminate diastolic function due to MAC. Left ventricular ejection fraction, by estimation, is 55 to 60%. The left ventricle has normal function. The left ventricle has no regional wall motion abnormalities. The left ventricular internal cavity size was normal in size. There is moderate left ventricular hypertrophy. Left ventricular diastolic parameters are indeterminate. Right Ventricle: The right ventricular size is normal. No increase in right ventricular wall thickness. Right ventricular systolic function is normal. Left Atrium: Left atrial size was severely dilated. Right Atrium: Grossly normal in size. Right atrial size was not well visualized. Pericardium: There is no evidence of pericardial effusion. Mitral Valve: The mitral valve is degenerative in appearance. Moderate to severe mitral annular calcification. No evidence of mitral valve regurgitation. No evidence of mitral valve stenosis. Tricuspid Valve: The tricuspid valve is grossly normal. Tricuspid valve regurgitation is not demonstrated. No evidence of tricuspid stenosis. Aortic Valve: The aortic valve is calcified. Aortic valve regurgitation is not visualized. Mild aortic valve sclerosis is present, with no evidence of aortic  valve stenosis. Pulmonic Valve: The pulmonic valve was not well visualized. Pulmonic valve regurgitation is mild. Aorta: The aortic root is normal  ECHOCARDIOGRAM REPORT   Patient Name:   Brittany Roberts Date of Exam: 06/08/2020 Medical Rec #:  [id number]   Height:       63.5 in Accession #:    [id number]  Weight:       274.1 lb Date of Birth:  1941-03-04   BSA:          2.224 m Patient Age:    63 years    BP:           114/60 mmHg Patient Gender: F           HR:           57 bpm. Exam Location:  Inpatient Procedure: 2D Echo Indications:     acute diastolic chf 979.89  History:         Patient has prior history of Echocardiogram examinations, most                  recent 12/06/2019. Cardiomegaly, CAD, Lupus. Chronic kidney                  disease. history of stroke., Signs/Symptoms:Altered Mental                  Status; Risk Factors:Diabetes, Dyslipidemia and Sleep Apnea.  Sonographer:     Johny Chess Referring Phys:  [id number] [place] Diagnosing Phys: Adrian Prows MD IMPRESSIONS  1. Indeterminate diastolic function due to MAC. Left ventricular ejection fraction, by estimation, is 55 to 60%. The left ventricle has normal function. The left ventricle has no regional wall motion abnormalities. There is moderate left ventricular hypertrophy. Left ventricular diastolic parameters are indeterminate.  2. Right  ventricular systolic function is normal. The right ventricular size is normal.  3. Left atrial size was severely dilated.  4. Grossly normal in size.  5. The mitral valve is degenerative. No evidence of mitral valve regurgitation. No evidence of mitral stenosis. Moderate to severe mitral annular calcification.  6. The aortic valve is calcified. Aortic valve regurgitation is not visualized. Mild aortic valve sclerosis is present, with no evidence of aortic valve stenosis.  7. The inferior vena cava is normal in size with greater than 50% respiratory variability, suggesting right atrial pressure of 3 mmHg. Comparison(s): No significant change from prior study. No significant change from 12/06/2019. FINDINGS  Left Ventricle: Indeterminate diastolic function due to MAC. Left ventricular ejection fraction, by estimation, is 55 to 60%. The left ventricle has normal function. The left ventricle has no regional wall motion abnormalities. The left ventricular internal cavity size was normal in size. There is moderate left ventricular hypertrophy. Left ventricular diastolic parameters are indeterminate. Right Ventricle: The right ventricular size is normal. No increase in right ventricular wall thickness. Right ventricular systolic function is normal. Left Atrium: Left atrial size was severely dilated. Right Atrium: Grossly normal in size. Right atrial size was not well visualized. Pericardium: There is no evidence of pericardial effusion. Mitral Valve: The mitral valve is degenerative in appearance. Moderate to severe mitral annular calcification. No evidence of mitral valve regurgitation. No evidence of mitral valve stenosis. Tricuspid Valve: The tricuspid valve is grossly normal. Tricuspid valve regurgitation is not demonstrated. No evidence of tricuspid stenosis. Aortic Valve: The aortic valve is calcified. Aortic valve regurgitation is not visualized. Mild aortic valve sclerosis is present, with no evidence of aortic  valve stenosis. Pulmonic Valve: The pulmonic valve was not well visualized. Pulmonic valve regurgitation is mild. Aorta: The aortic root is normal  ECHOCARDIOGRAM REPORT   Patient Name:   Brittany Roberts Date of Exam: 06/08/2020 Medical Rec #:  [id number]   Height:       63.5 in Accession #:    [id number]  Weight:       274.1 lb Date of Birth:  1941-03-04   BSA:          2.224 m Patient Age:    63 years    BP:           114/60 mmHg Patient Gender: F           HR:           57 bpm. Exam Location:  Inpatient Procedure: 2D Echo Indications:     acute diastolic chf 979.89  History:         Patient has prior history of Echocardiogram examinations, most                  recent 12/06/2019. Cardiomegaly, CAD, Lupus. Chronic kidney                  disease. history of stroke., Signs/Symptoms:Altered Mental                  Status; Risk Factors:Diabetes, Dyslipidemia and Sleep Apnea.  Sonographer:     Johny Chess Referring Phys:  [id number] [place] Diagnosing Phys: Adrian Prows MD IMPRESSIONS  1. Indeterminate diastolic function due to MAC. Left ventricular ejection fraction, by estimation, is 55 to 60%. The left ventricle has normal function. The left ventricle has no regional wall motion abnormalities. There is moderate left ventricular hypertrophy. Left ventricular diastolic parameters are indeterminate.  2. Right  ventricular systolic function is normal. The right ventricular size is normal.  3. Left atrial size was severely dilated.  4. Grossly normal in size.  5. The mitral valve is degenerative. No evidence of mitral valve regurgitation. No evidence of mitral stenosis. Moderate to severe mitral annular calcification.  6. The aortic valve is calcified. Aortic valve regurgitation is not visualized. Mild aortic valve sclerosis is present, with no evidence of aortic valve stenosis.  7. The inferior vena cava is normal in size with greater than 50% respiratory variability, suggesting right atrial pressure of 3 mmHg. Comparison(s): No significant change from prior study. No significant change from 12/06/2019. FINDINGS  Left Ventricle: Indeterminate diastolic function due to MAC. Left ventricular ejection fraction, by estimation, is 55 to 60%. The left ventricle has normal function. The left ventricle has no regional wall motion abnormalities. The left ventricular internal cavity size was normal in size. There is moderate left ventricular hypertrophy. Left ventricular diastolic parameters are indeterminate. Right Ventricle: The right ventricular size is normal. No increase in right ventricular wall thickness. Right ventricular systolic function is normal. Left Atrium: Left atrial size was severely dilated. Right Atrium: Grossly normal in size. Right atrial size was not well visualized. Pericardium: There is no evidence of pericardial effusion. Mitral Valve: The mitral valve is degenerative in appearance. Moderate to severe mitral annular calcification. No evidence of mitral valve regurgitation. No evidence of mitral valve stenosis. Tricuspid Valve: The tricuspid valve is grossly normal. Tricuspid valve regurgitation is not demonstrated. No evidence of tricuspid stenosis. Aortic Valve: The aortic valve is calcified. Aortic valve regurgitation is not visualized. Mild aortic valve sclerosis is present, with no evidence of aortic  valve stenosis. Pulmonic Valve: The pulmonic valve was not well visualized. Pulmonic valve regurgitation is mild. Aorta: The aortic root is normal  Physician Discharge Summary  Brittany Roberts GYF:[id number] DOB: 1941-05-14 DOA: 06/07/2020  PCP: [name], [name], MD  Admit date: 06/07/2020 Discharge date: 06/17/2020  Admitted From: Home  Disposition: [facility]   Recommendations for Outpatient Follow-up:  1. Follow up with PCP in 1-2 weeks 2. Follow-up with cardiology, Dr. Earnie Larsson in 2-3 weeks 3. Discontinued home NPH in favor of Lantus 14 units apparently daily, continue to monitor blood sugar adjust accordingly 4. Discontinued home torsemide, continue to monitor daily weights and reassess if needs to restart diuretic 5. Discontinue Linzess 6. Started on Eliquis for new onset A. fib 7. Please obtain BMP in one week 8. Consider outpatient palliative care to follow-up for adult failure to thrive  Discharge Condition: Stable CODE STATUS: DNR Diet recommendation: Heart healthy/consistent carbohydrate diet  History of present illness:  Brittany Roberts is an 79 year old female with past medical history significant for CVA, chronic diastolic congestive heart failure, CAD s/p PCI, obesity, essential hypertension, type 2 diabetes mellitus, peripheral neuropathy, gout, chronic anxiety/depression, seizure disorder, GERD, asthma, memory deficits, lupus, history of LV apical thrombus status post 3 months of anticoagulation, Covid-19 viral infection March 2021 who presented to [facility] ED due to progressive confusion.  Due to mental status, history obtained from EDP, daughter at bedside and review of medical records.  Patient was previously in the ED few days prior and diagnosed with a UTI and sent home on Keflex.  Per daughter, confusion progressed leading to fall with unknown prolonged downtime.  Patient was found to have acute renal failure and A. fib which is a new diagnosis.  Hospitalist service consulted for further evaluation and management.  Hospital course:  Acute metabolic encephalopathy, POA Adult failure to Thrive Urinary tract  infection Patient presenting with progressive confusion, recently diagnosed with UTI and discharged home on Keflex.  No urine cultures obtained.  CT head with no acute intracranial findings, stable encephalomalacia right frontal lobe from prior infarct and stable atherosclerosis.  UDS negative.  Covid-19 PCR/influenza A/B negative.  Urine culture obtained 06/07/2020 with no growth.  Completed 3-day course of ceftriaxone. Blood culture 12/5 with no growth x 5 days.  MRI brain w/o contrast with no acute findings. Suspect underlying dementia with likely hospital-acquired delirium as contributing factors.  Melatonin 3 mg p.o. nightly.  Paroxysmal atrial fibrillation, new diagnosis Patient was noted to be in atrial fibrillation on presentation.  No known prior history but patient did have recent apical thrombus and was treated with Coumadin by cardiology, Dr. Shanon Brow for 3 months.  TSH within normal limits. Eliquis 2.5 mg p.o. twice daily. Metoprolol succinate 11m PO daily.  Outpatient follow-up with cardiology  Chronic hypoxic respiratory failure On 3 L nasal cannula at baseline.  Continue Symbicort and montelukast 10 mg p.o. daily.  Chronic diastolic congestive heart failure TTE with LVEF 55-60%, no regional LV wall motion abnormalities, indeterminate LV diastolic parameters, IVC normal in size.  On metoprolol succinate 50 mg p.o. daily, imdurr 30 mg p.o. daily, torsemide 20 mg p.o. twice daily and Lipitor at home.  Discontinued home torsemide secondary to poor oral intake and acute renal failure.  Continue metoprolol succinate 50 mg p.o. daily, Imdur Lipitor.  Continue to monitor daily weights.  Need to assess if reinitiation of diuretic therapy warranted if weight gain.  Acute renal failure on CKD stage IIIb Baseline creatinine 1.6 with a GFR of 33.  Patient presented with a creatinine of 3.17 with a GFR of 14.  Etiology likely prerenal dehydration from  Physician Discharge Summary  Brittany Roberts GYF:[id number] DOB: 1941-05-14 DOA: 06/07/2020  PCP: [name], [name], MD  Admit date: 06/07/2020 Discharge date: 06/17/2020  Admitted From: Home  Disposition: [facility]   Recommendations for Outpatient Follow-up:  1. Follow up with PCP in 1-2 weeks 2. Follow-up with cardiology, Dr. Earnie Larsson in 2-3 weeks 3. Discontinued home NPH in favor of Lantus 14 units apparently daily, continue to monitor blood sugar adjust accordingly 4. Discontinued home torsemide, continue to monitor daily weights and reassess if needs to restart diuretic 5. Discontinue Linzess 6. Started on Eliquis for new onset A. fib 7. Please obtain BMP in one week 8. Consider outpatient palliative care to follow-up for adult failure to thrive  Discharge Condition: Stable CODE STATUS: DNR Diet recommendation: Heart healthy/consistent carbohydrate diet  History of present illness:  Brittany Roberts is an 79 year old female with past medical history significant for CVA, chronic diastolic congestive heart failure, CAD s/p PCI, obesity, essential hypertension, type 2 diabetes mellitus, peripheral neuropathy, gout, chronic anxiety/depression, seizure disorder, GERD, asthma, memory deficits, lupus, history of LV apical thrombus status post 3 months of anticoagulation, Covid-19 viral infection March 2021 who presented to [facility] ED due to progressive confusion.  Due to mental status, history obtained from EDP, daughter at bedside and review of medical records.  Patient was previously in the ED few days prior and diagnosed with a UTI and sent home on Keflex.  Per daughter, confusion progressed leading to fall with unknown prolonged downtime.  Patient was found to have acute renal failure and A. fib which is a new diagnosis.  Hospitalist service consulted for further evaluation and management.  Hospital course:  Acute metabolic encephalopathy, POA Adult failure to Thrive Urinary tract  infection Patient presenting with progressive confusion, recently diagnosed with UTI and discharged home on Keflex.  No urine cultures obtained.  CT head with no acute intracranial findings, stable encephalomalacia right frontal lobe from prior infarct and stable atherosclerosis.  UDS negative.  Covid-19 PCR/influenza A/B negative.  Urine culture obtained 06/07/2020 with no growth.  Completed 3-day course of ceftriaxone. Blood culture 12/5 with no growth x 5 days.  MRI brain w/o contrast with no acute findings. Suspect underlying dementia with likely hospital-acquired delirium as contributing factors.  Melatonin 3 mg p.o. nightly.  Paroxysmal atrial fibrillation, new diagnosis Patient was noted to be in atrial fibrillation on presentation.  No known prior history but patient did have recent apical thrombus and was treated with Coumadin by cardiology, Dr. Shanon Brow for 3 months.  TSH within normal limits. Eliquis 2.5 mg p.o. twice daily. Metoprolol succinate 11m PO daily.  Outpatient follow-up with cardiology  Chronic hypoxic respiratory failure On 3 L nasal cannula at baseline.  Continue Symbicort and montelukast 10 mg p.o. daily.  Chronic diastolic congestive heart failure TTE with LVEF 55-60%, no regional LV wall motion abnormalities, indeterminate LV diastolic parameters, IVC normal in size.  On metoprolol succinate 50 mg p.o. daily, imdurr 30 mg p.o. daily, torsemide 20 mg p.o. twice daily and Lipitor at home.  Discontinued home torsemide secondary to poor oral intake and acute renal failure.  Continue metoprolol succinate 50 mg p.o. daily, Imdur Lipitor.  Continue to monitor daily weights.  Need to assess if reinitiation of diuretic therapy warranted if weight gain.  Acute renal failure on CKD stage IIIb Baseline creatinine 1.6 with a GFR of 33.  Patient presented with a creatinine of 3.17 with a GFR of 14.  Etiology likely prerenal dehydration from  ECHOCARDIOGRAM REPORT   Patient Name:   Brittany Roberts Date of Exam: 06/08/2020 Medical Rec #:  [id number]   Height:       63.5 in Accession #:    [id number]  Weight:       274.1 lb Date of Birth:  1941-03-04   BSA:          2.224 m Patient Age:    63 years    BP:           114/60 mmHg Patient Gender: F           HR:           57 bpm. Exam Location:  Inpatient Procedure: 2D Echo Indications:     acute diastolic chf 979.89  History:         Patient has prior history of Echocardiogram examinations, most                  recent 12/06/2019. Cardiomegaly, CAD, Lupus. Chronic kidney                  disease. history of stroke., Signs/Symptoms:Altered Mental                  Status; Risk Factors:Diabetes, Dyslipidemia and Sleep Apnea.  Sonographer:     Johny Chess Referring Phys:  [id number] [place] Diagnosing Phys: Adrian Prows MD IMPRESSIONS  1. Indeterminate diastolic function due to MAC. Left ventricular ejection fraction, by estimation, is 55 to 60%. The left ventricle has normal function. The left ventricle has no regional wall motion abnormalities. There is moderate left ventricular hypertrophy. Left ventricular diastolic parameters are indeterminate.  2. Right  ventricular systolic function is normal. The right ventricular size is normal.  3. Left atrial size was severely dilated.  4. Grossly normal in size.  5. The mitral valve is degenerative. No evidence of mitral valve regurgitation. No evidence of mitral stenosis. Moderate to severe mitral annular calcification.  6. The aortic valve is calcified. Aortic valve regurgitation is not visualized. Mild aortic valve sclerosis is present, with no evidence of aortic valve stenosis.  7. The inferior vena cava is normal in size with greater than 50% respiratory variability, suggesting right atrial pressure of 3 mmHg. Comparison(s): No significant change from prior study. No significant change from 12/06/2019. FINDINGS  Left Ventricle: Indeterminate diastolic function due to MAC. Left ventricular ejection fraction, by estimation, is 55 to 60%. The left ventricle has normal function. The left ventricle has no regional wall motion abnormalities. The left ventricular internal cavity size was normal in size. There is moderate left ventricular hypertrophy. Left ventricular diastolic parameters are indeterminate. Right Ventricle: The right ventricular size is normal. No increase in right ventricular wall thickness. Right ventricular systolic function is normal. Left Atrium: Left atrial size was severely dilated. Right Atrium: Grossly normal in size. Right atrial size was not well visualized. Pericardium: There is no evidence of pericardial effusion. Mitral Valve: The mitral valve is degenerative in appearance. Moderate to severe mitral annular calcification. No evidence of mitral valve regurgitation. No evidence of mitral valve stenosis. Tricuspid Valve: The tricuspid valve is grossly normal. Tricuspid valve regurgitation is not demonstrated. No evidence of tricuspid stenosis. Aortic Valve: The aortic valve is calcified. Aortic valve regurgitation is not visualized. Mild aortic valve sclerosis is present, with no evidence of aortic  valve stenosis. Pulmonic Valve: The pulmonic valve was not well visualized. Pulmonic valve regurgitation is mild. Aorta: The aortic root is normal  Physician Discharge Summary  Brittany Roberts GYF:[id number] DOB: 1941-05-14 DOA: 06/07/2020  PCP: [name], [name], MD  Admit date: 06/07/2020 Discharge date: 06/17/2020  Admitted From: Home  Disposition: [facility]   Recommendations for Outpatient Follow-up:  1. Follow up with PCP in 1-2 weeks 2. Follow-up with cardiology, Dr. Earnie Larsson in 2-3 weeks 3. Discontinued home NPH in favor of Lantus 14 units apparently daily, continue to monitor blood sugar adjust accordingly 4. Discontinued home torsemide, continue to monitor daily weights and reassess if needs to restart diuretic 5. Discontinue Linzess 6. Started on Eliquis for new onset A. fib 7. Please obtain BMP in one week 8. Consider outpatient palliative care to follow-up for adult failure to thrive  Discharge Condition: Stable CODE STATUS: DNR Diet recommendation: Heart healthy/consistent carbohydrate diet  History of present illness:  Brittany Roberts is an 79 year old female with past medical history significant for CVA, chronic diastolic congestive heart failure, CAD s/p PCI, obesity, essential hypertension, type 2 diabetes mellitus, peripheral neuropathy, gout, chronic anxiety/depression, seizure disorder, GERD, asthma, memory deficits, lupus, history of LV apical thrombus status post 3 months of anticoagulation, Covid-19 viral infection March 2021 who presented to [facility] ED due to progressive confusion.  Due to mental status, history obtained from EDP, daughter at bedside and review of medical records.  Patient was previously in the ED few days prior and diagnosed with a UTI and sent home on Keflex.  Per daughter, confusion progressed leading to fall with unknown prolonged downtime.  Patient was found to have acute renal failure and A. fib which is a new diagnosis.  Hospitalist service consulted for further evaluation and management.  Hospital course:  Acute metabolic encephalopathy, POA Adult failure to Thrive Urinary tract  infection Patient presenting with progressive confusion, recently diagnosed with UTI and discharged home on Keflex.  No urine cultures obtained.  CT head with no acute intracranial findings, stable encephalomalacia right frontal lobe from prior infarct and stable atherosclerosis.  UDS negative.  Covid-19 PCR/influenza A/B negative.  Urine culture obtained 06/07/2020 with no growth.  Completed 3-day course of ceftriaxone. Blood culture 12/5 with no growth x 5 days.  MRI brain w/o contrast with no acute findings. Suspect underlying dementia with likely hospital-acquired delirium as contributing factors.  Melatonin 3 mg p.o. nightly.  Paroxysmal atrial fibrillation, new diagnosis Patient was noted to be in atrial fibrillation on presentation.  No known prior history but patient did have recent apical thrombus and was treated with Coumadin by cardiology, Dr. Shanon Brow for 3 months.  TSH within normal limits. Eliquis 2.5 mg p.o. twice daily. Metoprolol succinate 11m PO daily.  Outpatient follow-up with cardiology  Chronic hypoxic respiratory failure On 3 L nasal cannula at baseline.  Continue Symbicort and montelukast 10 mg p.o. daily.  Chronic diastolic congestive heart failure TTE with LVEF 55-60%, no regional LV wall motion abnormalities, indeterminate LV diastolic parameters, IVC normal in size.  On metoprolol succinate 50 mg p.o. daily, imdurr 30 mg p.o. daily, torsemide 20 mg p.o. twice daily and Lipitor at home.  Discontinued home torsemide secondary to poor oral intake and acute renal failure.  Continue metoprolol succinate 50 mg p.o. daily, Imdur Lipitor.  Continue to monitor daily weights.  Need to assess if reinitiation of diuretic therapy warranted if weight gain.  Acute renal failure on CKD stage IIIb Baseline creatinine 1.6 with a GFR of 33.  Patient presented with a creatinine of 3.17 with a GFR of 14.  Etiology likely prerenal dehydration from  ECHOCARDIOGRAM REPORT   Patient Name:   Brittany Roberts Date of Exam: 06/08/2020 Medical Rec #:  [id number]   Height:       63.5 in Accession #:    [id number]  Weight:       274.1 lb Date of Birth:  1941-03-04   BSA:          2.224 m Patient Age:    63 years    BP:           114/60 mmHg Patient Gender: F           HR:           57 bpm. Exam Location:  Inpatient Procedure: 2D Echo Indications:     acute diastolic chf 979.89  History:         Patient has prior history of Echocardiogram examinations, most                  recent 12/06/2019. Cardiomegaly, CAD, Lupus. Chronic kidney                  disease. history of stroke., Signs/Symptoms:Altered Mental                  Status; Risk Factors:Diabetes, Dyslipidemia and Sleep Apnea.  Sonographer:     Johny Chess Referring Phys:  [id number] [place] Diagnosing Phys: Adrian Prows MD IMPRESSIONS  1. Indeterminate diastolic function due to MAC. Left ventricular ejection fraction, by estimation, is 55 to 60%. The left ventricle has normal function. The left ventricle has no regional wall motion abnormalities. There is moderate left ventricular hypertrophy. Left ventricular diastolic parameters are indeterminate.  2. Right  ventricular systolic function is normal. The right ventricular size is normal.  3. Left atrial size was severely dilated.  4. Grossly normal in size.  5. The mitral valve is degenerative. No evidence of mitral valve regurgitation. No evidence of mitral stenosis. Moderate to severe mitral annular calcification.  6. The aortic valve is calcified. Aortic valve regurgitation is not visualized. Mild aortic valve sclerosis is present, with no evidence of aortic valve stenosis.  7. The inferior vena cava is normal in size with greater than 50% respiratory variability, suggesting right atrial pressure of 3 mmHg. Comparison(s): No significant change from prior study. No significant change from 12/06/2019. FINDINGS  Left Ventricle: Indeterminate diastolic function due to MAC. Left ventricular ejection fraction, by estimation, is 55 to 60%. The left ventricle has normal function. The left ventricle has no regional wall motion abnormalities. The left ventricular internal cavity size was normal in size. There is moderate left ventricular hypertrophy. Left ventricular diastolic parameters are indeterminate. Right Ventricle: The right ventricular size is normal. No increase in right ventricular wall thickness. Right ventricular systolic function is normal. Left Atrium: Left atrial size was severely dilated. Right Atrium: Grossly normal in size. Right atrial size was not well visualized. Pericardium: There is no evidence of pericardial effusion. Mitral Valve: The mitral valve is degenerative in appearance. Moderate to severe mitral annular calcification. No evidence of mitral valve regurgitation. No evidence of mitral valve stenosis. Tricuspid Valve: The tricuspid valve is grossly normal. Tricuspid valve regurgitation is not demonstrated. No evidence of tricuspid stenosis. Aortic Valve: The aortic valve is calcified. Aortic valve regurgitation is not visualized. Mild aortic valve sclerosis is present, with no evidence of aortic  valve stenosis. Pulmonic Valve: The pulmonic valve was not well visualized. Pulmonic valve regurgitation is mild. Aorta: The aortic root is normal  Physician Discharge Summary  Brittany Roberts GYF:[id number] DOB: 1941-05-14 DOA: 06/07/2020  PCP: [name], [name], MD  Admit date: 06/07/2020 Discharge date: 06/17/2020  Admitted From: Home  Disposition: [facility]   Recommendations for Outpatient Follow-up:  1. Follow up with PCP in 1-2 weeks 2. Follow-up with cardiology, Dr. Earnie Larsson in 2-3 weeks 3. Discontinued home NPH in favor of Lantus 14 units apparently daily, continue to monitor blood sugar adjust accordingly 4. Discontinued home torsemide, continue to monitor daily weights and reassess if needs to restart diuretic 5. Discontinue Linzess 6. Started on Eliquis for new onset A. fib 7. Please obtain BMP in one week 8. Consider outpatient palliative care to follow-up for adult failure to thrive  Discharge Condition: Stable CODE STATUS: DNR Diet recommendation: Heart healthy/consistent carbohydrate diet  History of present illness:  Brittany Roberts is an 79 year old female with past medical history significant for CVA, chronic diastolic congestive heart failure, CAD s/p PCI, obesity, essential hypertension, type 2 diabetes mellitus, peripheral neuropathy, gout, chronic anxiety/depression, seizure disorder, GERD, asthma, memory deficits, lupus, history of LV apical thrombus status post 3 months of anticoagulation, Covid-19 viral infection March 2021 who presented to [facility] ED due to progressive confusion.  Due to mental status, history obtained from EDP, daughter at bedside and review of medical records.  Patient was previously in the ED few days prior and diagnosed with a UTI and sent home on Keflex.  Per daughter, confusion progressed leading to fall with unknown prolonged downtime.  Patient was found to have acute renal failure and A. fib which is a new diagnosis.  Hospitalist service consulted for further evaluation and management.  Hospital course:  Acute metabolic encephalopathy, POA Adult failure to Thrive Urinary tract  infection Patient presenting with progressive confusion, recently diagnosed with UTI and discharged home on Keflex.  No urine cultures obtained.  CT head with no acute intracranial findings, stable encephalomalacia right frontal lobe from prior infarct and stable atherosclerosis.  UDS negative.  Covid-19 PCR/influenza A/B negative.  Urine culture obtained 06/07/2020 with no growth.  Completed 3-day course of ceftriaxone. Blood culture 12/5 with no growth x 5 days.  MRI brain w/o contrast with no acute findings. Suspect underlying dementia with likely hospital-acquired delirium as contributing factors.  Melatonin 3 mg p.o. nightly.  Paroxysmal atrial fibrillation, new diagnosis Patient was noted to be in atrial fibrillation on presentation.  No known prior history but patient did have recent apical thrombus and was treated with Coumadin by cardiology, Dr. Shanon Brow for 3 months.  TSH within normal limits. Eliquis 2.5 mg p.o. twice daily. Metoprolol succinate 11m PO daily.  Outpatient follow-up with cardiology  Chronic hypoxic respiratory failure On 3 L nasal cannula at baseline.  Continue Symbicort and montelukast 10 mg p.o. daily.  Chronic diastolic congestive heart failure TTE with LVEF 55-60%, no regional LV wall motion abnormalities, indeterminate LV diastolic parameters, IVC normal in size.  On metoprolol succinate 50 mg p.o. daily, imdurr 30 mg p.o. daily, torsemide 20 mg p.o. twice daily and Lipitor at home.  Discontinued home torsemide secondary to poor oral intake and acute renal failure.  Continue metoprolol succinate 50 mg p.o. daily, Imdur Lipitor.  Continue to monitor daily weights.  Need to assess if reinitiation of diuretic therapy warranted if weight gain.  Acute renal failure on CKD stage IIIb Baseline creatinine 1.6 with a GFR of 33.  Patient presented with a creatinine of 3.17 with a GFR of 14.  Etiology likely prerenal dehydration from  Physician Discharge Summary  Brittany Roberts GYF:[id number] DOB: 1941-05-14 DOA: 06/07/2020  PCP: [name], [name], MD  Admit date: 06/07/2020 Discharge date: 06/17/2020  Admitted From: Home  Disposition: [facility]   Recommendations for Outpatient Follow-up:  1. Follow up with PCP in 1-2 weeks 2. Follow-up with cardiology, Dr. Earnie Larsson in 2-3 weeks 3. Discontinued home NPH in favor of Lantus 14 units apparently daily, continue to monitor blood sugar adjust accordingly 4. Discontinued home torsemide, continue to monitor daily weights and reassess if needs to restart diuretic 5. Discontinue Linzess 6. Started on Eliquis for new onset A. fib 7. Please obtain BMP in one week 8. Consider outpatient palliative care to follow-up for adult failure to thrive  Discharge Condition: Stable CODE STATUS: DNR Diet recommendation: Heart healthy/consistent carbohydrate diet  History of present illness:  Brittany Roberts is an 79 year old female with past medical history significant for CVA, chronic diastolic congestive heart failure, CAD s/p PCI, obesity, essential hypertension, type 2 diabetes mellitus, peripheral neuropathy, gout, chronic anxiety/depression, seizure disorder, GERD, asthma, memory deficits, lupus, history of LV apical thrombus status post 3 months of anticoagulation, Covid-19 viral infection March 2021 who presented to [facility] ED due to progressive confusion.  Due to mental status, history obtained from EDP, daughter at bedside and review of medical records.  Patient was previously in the ED few days prior and diagnosed with a UTI and sent home on Keflex.  Per daughter, confusion progressed leading to fall with unknown prolonged downtime.  Patient was found to have acute renal failure and A. fib which is a new diagnosis.  Hospitalist service consulted for further evaluation and management.  Hospital course:  Acute metabolic encephalopathy, POA Adult failure to Thrive Urinary tract  infection Patient presenting with progressive confusion, recently diagnosed with UTI and discharged home on Keflex.  No urine cultures obtained.  CT head with no acute intracranial findings, stable encephalomalacia right frontal lobe from prior infarct and stable atherosclerosis.  UDS negative.  Covid-19 PCR/influenza A/B negative.  Urine culture obtained 06/07/2020 with no growth.  Completed 3-day course of ceftriaxone. Blood culture 12/5 with no growth x 5 days.  MRI brain w/o contrast with no acute findings. Suspect underlying dementia with likely hospital-acquired delirium as contributing factors.  Melatonin 3 mg p.o. nightly.  Paroxysmal atrial fibrillation, new diagnosis Patient was noted to be in atrial fibrillation on presentation.  No known prior history but patient did have recent apical thrombus and was treated with Coumadin by cardiology, Dr. Shanon Brow for 3 months.  TSH within normal limits. Eliquis 2.5 mg p.o. twice daily. Metoprolol succinate 11m PO daily.  Outpatient follow-up with cardiology  Chronic hypoxic respiratory failure On 3 L nasal cannula at baseline.  Continue Symbicort and montelukast 10 mg p.o. daily.  Chronic diastolic congestive heart failure TTE with LVEF 55-60%, no regional LV wall motion abnormalities, indeterminate LV diastolic parameters, IVC normal in size.  On metoprolol succinate 50 mg p.o. daily, imdurr 30 mg p.o. daily, torsemide 20 mg p.o. twice daily and Lipitor at home.  Discontinued home torsemide secondary to poor oral intake and acute renal failure.  Continue metoprolol succinate 50 mg p.o. daily, Imdur Lipitor.  Continue to monitor daily weights.  Need to assess if reinitiation of diuretic therapy warranted if weight gain.  Acute renal failure on CKD stage IIIb Baseline creatinine 1.6 with a GFR of 33.  Patient presented with a creatinine of 3.17 with a GFR of 14.  Etiology likely prerenal dehydration from

## 2020-06-14 ENCOUNTER — Telehealth: Payer: Self-pay | Admitting: Student

## 2020-06-14 DIAGNOSIS — I480 Paroxysmal atrial fibrillation: Secondary | ICD-10-CM

## 2020-06-14 LAB — BASIC METABOLIC PANEL
Anion gap: 13 (ref 5–15)
BUN: 26 mg/dL — ABNORMAL HIGH (ref 8–23)
CO2: 19 mmol/L — ABNORMAL LOW (ref 22–32)
Calcium: 10.4 mg/dL — ABNORMAL HIGH (ref 8.9–10.3)
Chloride: 112 mmol/L — ABNORMAL HIGH (ref 98–111)
Creatinine, Ser: 1.42 mg/dL — ABNORMAL HIGH (ref 0.44–1.00)
GFR, Estimated: 37 mL/min — ABNORMAL LOW (ref >60)
Glucose, Bld: 199 mg/dL — ABNORMAL HIGH (ref 70–99)
Potassium: 4 mmol/L (ref 3.5–5.1)
Sodium: 144 mmol/L (ref 135–145)

## 2020-06-14 LAB — GLUCOSE, CAPILLARY
Glucose-Capillary: 197 mg/dL — ABNORMAL HIGH (ref 70–99)
Glucose-Capillary: 171 mg/dL — ABNORMAL HIGH (ref 70–99)
Glucose-Capillary: 153 mg/dL — ABNORMAL HIGH (ref 70–99)

## 2020-06-14 MED ORDER — DILTIAZEM HCL 25 MG/5ML IV SOLN
10.0000 mg | Freq: Once | INTRAVENOUS | Status: AC
  Administered 2020-06-14: 10 mg via INTRAVENOUS
  Filled 2020-06-14: qty 5

## 2020-06-14 MED ORDER — DILTIAZEM HCL ER COATED BEADS 120 MG PO CP24
120.0000 mg | ORAL_CAPSULE | Freq: Every day | ORAL | Status: DC
  Administered 2020-06-14 – 2020-06-15 (×2): 120 mg via ORAL
  Filled 2020-06-14 (×2): qty 1

## 2020-06-14 MED ORDER — DILTIAZEM HCL ER COATED BEADS 120 MG PO CP24: 120.0000 mg | ORAL_CAPSULE | Freq: Every day | ORAL | Status: DC

## 2020-06-14 MED ORDER — INSULIN GLARGINE 100 UNIT/ML ~~LOC~~ SOLN: 14.0000 [IU] | Freq: Every day | SUBCUTANEOUS | 11 refills | Status: DC

## 2020-06-14 MED ORDER — INSULIN GLARGINE 100 UNIT/ML ~~LOC~~ SOLN
4.0000 [IU] | Freq: Once | SUBCUTANEOUS | Status: AC
  Administered 2020-06-14: 4 [IU] via SUBCUTANEOUS
  Filled 2020-06-14: qty 0.04

## 2020-06-14 MED ORDER — INSULIN GLARGINE 100 UNIT/ML ~~LOC~~ SOLN
14.0000 [IU] | Freq: Every day | SUBCUTANEOUS | Status: DC
  Administered 2020-06-15 – 2020-06-16 (×2): 14 [IU] via SUBCUTANEOUS
  Filled 2020-06-14 (×3): qty 0.14

## 2020-06-14 MED ORDER — MELATONIN 3 MG PO TABS
3.0000 mg | ORAL_TABLET | Freq: Every day | ORAL | Status: DC
  Administered 2020-06-14 – 2020-06-16 (×3): 3 mg via ORAL
  Filled 2020-06-14 (×3): qty 1

## 2020-06-14 MED ORDER — QUETIAPINE FUMARATE 25 MG PO TABS
12.5000 mg | ORAL_TABLET | Freq: Every day | ORAL | Status: DC
  Administered 2020-06-15 – 2020-06-16 (×2): 12.5 mg via ORAL
  Filled 2020-06-14 (×2): qty 1

## 2020-06-14 MED ORDER — QUETIAPINE FUMARATE 25 MG PO TABS
12.5000 mg | ORAL_TABLET | ORAL | Status: AC
  Administered 2020-06-14: 12.5 mg via ORAL
  Filled 2020-06-14: qty 1

## 2020-06-14 MED ORDER — METOPROLOL SUCCINATE ER 100 MG PO TB24
100.0000 mg | ORAL_TABLET | Freq: Every day | ORAL | Status: DC
  Administered 2020-06-14 – 2020-06-15 (×2): 100 mg via ORAL
  Filled 2020-06-14 (×2): qty 1

## 2020-06-14 MED ORDER — METOPROLOL SUCCINATE ER 100 MG PO TB24: 100.0000 mg | ORAL_TABLET | Freq: Every day | ORAL | Status: DC

## 2020-06-14 NOTE — Progress Notes (Signed)
Patient refused to eat dinner, RN encouraged patient to eat and patient closed her mouth and said no she did not want to eat. RN will continue to monitor this patient.

## 2020-06-14 NOTE — Progress Notes (Signed)
PROGRESS NOTE    Brittany Roberts  UJW:[id number] DOB: Jun 14, 1941 DOA: 06/07/2020 PCP: [place], Brittany Roberts    Brief Narrative:  Brittany Roberts is an 79 year old female with past medical history significant for CVA, chronic diastolic congestive heart failure, CAD s/p PCI, obesity, essential hypertension, type 2 diabetes mellitus, peripheral neuropathy, gout, chronic anxiety/depression, seizure disorder, GERD, asthma, memory deficits, lupus, history of LV apical thrombus status post 3 months of anticoagulation, Covid-19 viral infection March 2021 who presented to [facility] ED due to progressive confusion.  Due to mental status, history obtained from EDP, daughter at bedside and review of medical records.  Patient was previously in the ED few days prior and diagnosed with a UTI and sent home on Keflex.  Per daughter, confusion progressed leading to fall with unknown prolonged downtime.  Patient was found to have acute renal failure and A. fib which is a new diagnosis.  Hospitalist service consulted for further evaluation and management.   Assessment & Plan:   Active Problems:   Chronic kidney disease, stage III (moderate) (HCC)   Chronic respiratory failure with hypoxia, on home O2 therapy (HCC)   AMS (altered mental status)   Atrial fibrillation with RVR (HCC)   Acute metabolic encephalopathy, POA Urinary tract infection Patient presenting with progressive confusion, recently diagnosed with UTI and discharged home on Keflex.  No urine cultures obtained.  CT head with no acute intracranial findings, stable encephalomalacia right frontal lobe from prior infarct and stable atherosclerosis.  UDS negative.  Covid-19 PCR/influenza A/B negative.  Urine culture obtained 06/07/2020 with no growth.  Completed 3-day course of ceftriaxone.  Suspect underlying dementia with likely hospital-acquired delirium as contributing factors.  Blood culture 12/5 with no growth x 5 days.  MRI brain w/o contrast with no acute  findings. --Seroquel 12.5 mg p.o. nightly --Melatonin 3 mg p.o. nightly  Paroxysmal atrial fibrillation, new diagnosis Patient was noted to be in atrial fibrillation on presentation.  No known prior history but patient did have recent apical thrombus and was treated with Coumadin by cardiology, Dr. Abby Potash for 3 months.  TSH within normal limits. --Eliquis 2.5 mg p.o. twice daily --metoprolol succinate 100 mg p.o. daily --Start Cardizem 120 mg p.o. daily --Monitor on telemetry  Chronic hypoxic respiratory failure On 3 L nasal cannula at baseline.  Continue Dulera 100-5 mcg 2 puffs p.o. twice daily (substituted for Symbicort), montelukast 10 mg p.o. daily.  Chronic diastolic congestive heart failure TTE with LVEF 55-60%, no regional LV wall motion abnormalities, indeterminate LV diastolic parameters, IVC normal in size.  On metoprolol succinate 50 mg p.o. daily, indoor 30 mg p.o. daily, torsemide 20 mg p.o. twice daily and Lipitor at home. --Resume metoprolol succinate at reduced dose 25 mg p.o. daily --Continue Lipitor 20 mg p.o. daily --Strict I's and O's and daily weights  Acute renal failure on CKD stage IIIb Baseline creatinine 1.6 with a GFR of 33.  Patient presented with a creatinine of 3.17 with a GFR of 14.  Etiology likely prerenal dehydration from poor oral intake in the preceding days prior to hospitalization.  Renal ultrasound with findings suggestive of medical renal disease and distended urinary bladder. --Cr 3.17>2.53>2.48>2.45>2.07>1.53>1.59>1.42 --Continue NS at 50 mL's per hour --Avoid nephrotoxins, renally dose all medications --Continue monitor BMP daily  Type 2 diabetes mellitus Hemoglobin A1c 8.5, not optimally controlled. --Lantus 14 units subcutaneously daily --Insulin sliding scale for further coverage --CBGs before every meal/at bedtime  Elevated calcium Etiology likely secondary to dehydration.  PTH  intact low and vitamin D within normal  limits. --Continue IV fluid hydration  Seizure disorder --Keppra 500 mg p.o. twice daily  Anxiety/depression Peripheral neuropathy --Cymbalta 60 mg p.o. daily --Gabapentin 100 mg p.o. nightly  GERD: Continue PPI  Hx LV apical thrombus Completed 3 months of anticoagulation previously.  Started on Eliquis 2.5 mg p.o. twice daily for A. fib as above.  History of CVA/CAD Not on antiplatelet or statin.  Unsure if she has any contraindications or intolerance to these.  Outpatient follow-up.  Obesity Body mass index is 49.34 kg/m.  Needs aggressive lifestyle changes/weight loss as this complicates all facets of care.  History of Covid-19 viral infection March 2021    DVT prophylaxis: Eliquis Code Status: Full code Family Communication: No family present at bedside this morning  Disposition Plan:  Status is: Inpatient  Remains inpatient appropriate because:Altered mental status, Unsafe d/c plan and Inpatient level of care appropriate due to severity of illness   Dispo:  Patient From: Home  Planned Disposition: Skilled Nursing Facility  Expected discharge date: 06/13/2020  Medically stable for discharge: Yes   Consultants:   none  Procedures:   TTE  Antimicrobials:   Ceftriaxone 12/4 - 12/7  Fosfomycin 12/4   Subjective: Patient seen and examined bedside, resting comfortably.  Heart rate elevated in the 120s-130s this morning.  Given 5 mg IV metoprolol followed by 10 mg IV Cardizem with resolution of atrial fibrillation with RVR.  Patient continues to be confused.  No family present at bedside this morning.  Discharge held yesterday by [facility] SNF refusing to take patient due to "behaviors".  Social work will now need to look into other SNF options.  No other acute concerns overnight per nursing staff.  Objective: Vitals:   06/13/20 2019 06/14/20 0424 06/14/20 1100 06/14/20 1138  BP: (!) 145/85 (!) 145/90 (!) 163/151 (!) 145/90  Pulse: 85 80 (!) 134 (!) 59   Resp: 18 18 18 18   Temp: 98.4 F (36.9 C) 98 F (36.7 C) 98.6 F (37 C) 98.4 F (36.9 C)  TempSrc: Axillary Oral Oral Oral  SpO2: 96% 95% 99% 93%  Weight:  128.4 kg      Intake/Output Summary (Last 24 hours) at 06/14/2020 1422 Last data filed at 06/14/2020 1300 Gross per 24 hour  Intake 2050.17 ml  Output 1150 ml  Net 900.17 ml   Filed Weights   06/12/20 0500 06/12/20 2021 06/14/20 0424  Weight: 129.7 kg 127.5 kg 128.4 kg    Examination:  General exam: Appears calm and comfortable, confused Respiratory system: Clear to auscultation. Respiratory effort normal.  On 2 L nasal cannula (baseline 3L Brittany Roberts) Cardiovascular system: S1 & S2 heard, tachycardic, irregularly irregular rhythm. No JVD, murmurs, rubs, gallops or clicks. No pedal edema. Gastrointestinal system: Abdomen is nondistended, soft and nontender. No organomegaly or masses felt. Normal bowel sounds heard. Central nervous system: Alert. No focal neurological deficits. Extremities: Symmetric 5 x 5 power. Skin: No rashes, lesions or ulcers Psychiatry: Judgement and insight appear poor.     Data Reviewed: I have personally reviewed following labs and imaging studies  CBC: Recent Labs  Lab 06/07/20 1458 06/08/20 0634 06/09/20 0228 06/10/20 0249 06/11/20 0428  WBC 14.0* 12.8* 9.7 10.2 10.9*  NEUTROABS 11.0* 8.9* 6.0 6.0  --   HGB 11.7* 11.0* 10.4* 9.5* 9.2*  HCT 34.9* 32.3* 30.8* 28.3* 26.5*  MCV 75.7* 75.8* 75.7* 75.7* 75.3*  PLT 179 157 171 159 154   Basic Metabolic Panel: Recent Labs  Lab 06/08/20 0445 06/09/20 0228 06/10/20 0249 06/11/20 0428 06/12/20 0109 06/13/20 0239 06/14/20 0358  NA 142   < > 142 143 142 144 144  K 3.7   < > 4.1 3.9 4.2 4.0 4.0  CL 103   < > 106 109 109 110 112*  CO2 27   < > 27 25 26 24  19*  GLUCOSE 200*   < > 170* 131* 151* 167* 199*  BUN 59*   < > 54* 48* 38* 29* 26*  CREATININE 2.53*   < > 2.45* 2.07* 1.53* 1.59* 1.42*  CALCIUM 11.2*   < > 11.6* 11.2* 11.2* 10.7*  10.4*  MG 1.9  --   --   --  1.8 1.6*  --    < > = values in this interval not displayed.   GFR: Estimated Creatinine Clearance: 41.7 mL/min (A) (by C-G formula based on SCr of 1.42 mg/dL (H)). Liver Function Tests: Recent Labs  Lab 06/07/20 1458  AST 38  ALT 21  ALKPHOS 97  BILITOT 0.8  PROT 7.5  ALBUMIN 3.4*   No results for input(s): LIPASE, AMYLASE in the last 168 hours. Recent Labs  Lab 06/07/20 1520  AMMONIA <9*   Coagulation Profile: No results for input(s): INR, PROTIME in the last 168 hours. Cardiac Enzymes: Recent Labs  Lab 06/07/20 1458 06/08/20 0445  CKTOTAL 317* 301*   BNP (last 3 results) No results for input(s): PROBNP in the last 8760 hours. HbA1C: No results for input(s): HGBA1C in the last 72 hours. CBG: Recent Labs  Lab 06/13/20 0631 06/13/20 1653 06/13/20 2107 06/14/20 0602 06/14/20 1121  GLUCAP 148* 187* 184* 197* 171*   Lipid Profile: No results for input(s): CHOL, HDL, LDLCALC, TRIG, CHOLHDL, LDLDIRECT in the last 72 hours. Thyroid Function Tests: No results for input(s): TSH, T4TOTAL, FREET4, T3FREE, THYROIDAB in the last 72 hours. Anemia Panel: No results for input(s): VITAMINB12, FOLATE, FERRITIN, TIBC, IRON, RETICCTPCT in the last 72 hours. Sepsis Labs: No results for input(s): PROCALCITON, LATICACIDVEN in the last 168 hours.  Recent Results (from the past 240 hour(s))  Resp Panel by RT-PCR (Flu A&B, Covid) Nasopharyngeal Swab     Status: None   Collection Time: 06/07/20  4:20 PM   Specimen: Nasopharyngeal Swab; Nasopharyngeal(NP) swabs in vial transport medium  Result Value Ref Range Status   SARS Coronavirus 2 by RT PCR NEGATIVE NEGATIVE Final    Comment: (NOTE) SARS-CoV-2 target nucleic acids are NOT DETECTED.  The SARS-CoV-2 RNA is generally detectable in upper respiratory specimens during the acute phase of infection. The lowest concentration of SARS-CoV-2 viral copies this assay can detect is 138 copies/mL. A negative  result does not preclude SARS-Cov-2 infection and should not be used as the sole basis for treatment or other patient management decisions. A negative result may occur with  improper specimen collection/handling, submission of specimen other than nasopharyngeal swab, presence of viral mutation(s) within the areas targeted by this assay, and inadequate number of viral copies(<138 copies/mL). A negative result must be combined with clinical observations, patient history, and epidemiological information. The expected result is Negative.  Fact Sheet for Patients:  BloggerCourse.com  Fact Sheet for Healthcare Providers:  SeriousBroker.it  This test is no t yet approved or cleared by the [place] FDA and  has been authorized for detection and/or diagnosis of SARS-CoV-2 by FDA under an Emergency Use Authorization (EUA). This EUA will remain  in effect (meaning this test can be used) for the duration of  the COVID-19 declaration under Section 564(b)(1) of the Act, 21 U.S.C.section 360bbb-3(b)(1), unless the authorization is terminated  or revoked sooner.       Influenza A by PCR NEGATIVE NEGATIVE Final   Influenza B by PCR NEGATIVE NEGATIVE Final    Comment: (NOTE) The Xpert Xpress SARS-CoV-2/FLU/RSV plus assay is intended as an aid in the diagnosis of influenza from Nasopharyngeal swab specimens and should not be used as a sole basis for treatment. Nasal washings and aspirates are unacceptable for Xpert Xpress SARS-CoV-2/FLU/RSV testing.  Fact Sheet for Patients: BloggerCourse.com  Fact Sheet for Healthcare Providers: SeriousBroker.it  This test is not yet approved or cleared by the [place] FDA and has been authorized for detection and/or diagnosis of SARS-CoV-2 by FDA under an Emergency Use Authorization (EUA). This EUA will remain in effect (meaning this test can be used)  for the duration of the COVID-19 declaration under Section 564(b)(1) of the Act, 21 U.S.C. section 360bbb-3(b)(1), unless the authorization is terminated or revoked.  Performed at [facility], [address] [place], [place] [address]   Urine culture     Status: None   Collection Time: 06/07/20  6:16 PM   Specimen: Urine, Random  Result Value Ref Range Status   Specimen Description URINE, RANDOM  Final   Special Requests NONE  Final   Culture   Final    NO GROWTH Performed at [facility], [address] [place], [place] [address]    Report Status 06/09/2020 FINAL  Final  Culture, blood (routine x 2)     Status: None   Collection Time: 06/08/20  4:46 AM   Specimen: BLOOD LEFT HAND  Result Value Ref Range Status   Specimen Description BLOOD LEFT HAND  Final   Special Requests   Final    BOTTLES DRAWN AEROBIC ONLY Blood Culture results may not be optimal due to an inadequate volume of blood received in culture bottles   Culture   Final    NO GROWTH 5 DAYS Performed at [facility], [address] [place], [place] [address]    Report Status 06/13/2020 FINAL  Final  SARS Coronavirus 2 by RT PCR (hospital order, performed in [facility] hospital lab) Nasopharyngeal Nasopharyngeal Swab     Status: None   Collection Time: 06/13/20  3:23 PM   Specimen: Nasopharyngeal Swab  Result Value Ref Range Status   SARS Coronavirus 2 NEGATIVE NEGATIVE Final    Comment: (NOTE) SARS-CoV-2 target nucleic acids are NOT DETECTED.  The SARS-CoV-2 RNA is generally detectable in upper and lower respiratory specimens during the acute phase of infection. The lowest concentration of SARS-CoV-2 viral copies this assay can detect is 250 copies / mL. A negative result does not preclude SARS-CoV-2 infection and should not be used as the sole basis for treatment or other patient management decisions.  A negative result may occur with improper specimen collection / handling, submission  of specimen other than nasopharyngeal swab, presence of viral mutation(s) within the areas targeted by this assay, and inadequate number of viral copies (<250 copies / mL). A negative result must be combined with clinical observations, patient history, and epidemiological information.  Fact Sheet for Patients:   BoilerBrush.com.cy  Fact Sheet for Healthcare Providers: https://pope.com/  This test is not yet approved or  cleared by the [place] FDA and has been authorized for detection and/or diagnosis of SARS-CoV-2 by FDA under an Emergency Use Authorization (EUA).  This EUA will  remain in effect (meaning this test can be used) for the duration of the COVID-19 declaration under Section 564(b)(1) of the Act, 21 U.S.C. section 360bbb-3(b)(1), unless the authorization is terminated or revoked sooner.  Performed at [facility], [address], [place], [place] [address]          Radiology Studies: No results found.      Scheduled Meds: . allopurinol  200 mg Oral Daily  . apixaban  2.5 mg Oral BID  . atorvastatin  20 mg Oral Daily  . DULoxetine  60 mg Oral Daily  . ferrous sulfate  325 mg Oral BID WC  . gabapentin  100 mg Oral QHS  . insulin aspart  0-5 Units Subcutaneous QHS  . insulin aspart  0-9 Units Subcutaneous TID WC  . insulin glargine  10 Units Subcutaneous Daily  . levETIRAcetam  500 mg Oral BID  . melatonin  3 mg Oral QHS  . metoprolol succinate  100 mg Oral Daily  . mometasone-formoterol  2 puff Inhalation BID  . montelukast  10 mg Oral QHS  . pantoprazole  40 mg Oral Daily  . QUEtiapine  12.5 mg Oral NOW  . [START ON 06/15/2020] QUEtiapine  12.5 mg Oral QHS   Continuous Infusions: . sodium chloride 50 mL/hr at 06/13/20 2216     LOS: 7 days    Time spent: 34 minutes spent on chart review, discussion with nursing staff, consultants, updating family and interview/physical exam; more than 50%  of that time was spent in counseling and/or coordination of care.    Alvira Philips [place], DO [facility] Available via Epic secure chat 7am-7pm After these hours, please refer to coverage provider listed on [email or web address] 06/14/2020, 2:22 PM

## 2020-06-14 NOTE — Progress Notes (Signed)
Telemetry reported to RN that patient was tachycardic, RN administered PRN metoprolol and alerted the MD, new dosage orders were placed for scheduled medications. RN will continue to monitor this patient.

## 2020-06-14 NOTE — Progress Notes (Signed)
Patient's HR was greater than 130 when rapid response was notified. Patient has since been administered metoprolol PRN, and RN will continue to monitor this patient.

## 2020-06-14 NOTE — Significant Event (Signed)
Rapid Response Event Note   Reason for Call :  Tachycardia  Initial Focused Assessment:  Called by charge RN stating that the patient was tachycardic.  She asked for a second set of eyes on the patient.  Patient was alert and asymptomatic for my visit with her.  Patient was in Afib rhythm on her tele box.  The patient denies any chest pain, SOB, NV.  We were unable to get an accurate DBP on her due to her continuous movement of her extremities.  145/unable to get accurate DBP ECG 134 afib RR 18 O2 93 on 3 L [place]     Interventions:  Patient given her scheduled beta blocker, IV metoprolol, IV cardizem  Plan of Care:  In my opinion there is no need to transfer this patient to a higher level of care unless she becomes symptomatic   Event Summary:   MD Notified: [place] MD Call Time: [place] Time: 1040 End Time: [place], RN

## 2020-06-14 NOTE — TOC Progression Note (Addendum)
Transition of Care [facility]) - Progression Note    Patient Details  Name: Brittany Roberts MRN: [id number] Date of Birth: 1941-06-07  Transition of Care [facility]) CM/SW [place], [place] Phone Number: 06/14/2020, 4:04 PM  Clinical Narrative:    CSW spoke with pt's daughters who have selected [facility]. Janie from [facility] contacted and she said she would look at the chart and speak with the daughters. SW will continue to follow.  Will need to start new insurance auth. [facility] discussing with family now.  Family does not want [facility] as they only have a covid bed.   Expected Discharge Plan: [place] Barriers to Discharge: Continued Medical Work up  Expected Discharge Plan and Services Expected Discharge Plan: [place] In-house Referral: Clinical Social Work   Post Acute Care Choice: [place] Living arrangements for the past 2 months: Apartment Expected Discharge Date: 06/13/20                                     Social Determinants of Health (SDOH) Interventions    Readmission Risk Interventions No flowsheet data found.

## 2020-06-14 NOTE — Progress Notes (Signed)
°   06/14/20 1100  Assess: MEWS Score  Temp 98.6 F (37 C)  BP (!) 163/151  Pulse Rate (!) 134  Resp 18  SpO2 99 %  O2 Device Nasal Cannula  O2 Flow Rate (L/min) 2 L/min  Assess: MEWS Score  MEWS Temp 0  MEWS Systolic 0  MEWS Pulse 3  MEWS RR 0  MEWS LOC 0  MEWS Score 3  MEWS Score Color Yellow  Assess: if the MEWS score is Yellow or Red  Were vital signs taken at a resting state? Yes  Focused Assessment No change from prior assessment  Early Detection of Sepsis Score *See Row Information* Medium  MEWS guidelines implemented *See Row Information* Yes  Treat  MEWS Interventions Administered prn meds/treatments;Administered scheduled meds/treatments  Pain Scale 0-10  Pain Score 0  Take Vital Signs  Increase Vital Sign Frequency  Yellow: Q 2hr X 2 then Q 4hr X 2, if remains yellow, continue Q 4hrs  Escalate  MEWS: Escalate Yellow: discuss with charge nurse/RN and consider discussing with provider and RRT  Notify: Charge Nurse/RN  Name of Charge Nurse/RN Notified NIchola Gauntlett  Date Charge Nurse/RN Notified 06/14/20  Time Charge Nurse/RN Notified 1100  Notify: Provider  Provider Name/Title Eric [place]  Date Provider Notified 06/14/20  Time Provider Notified 1100  Notification Type Page  Notification Reason Change in status;Other (Comment)  Response See new orders  Date of Provider Response 06/14/20  Time of Provider Response 1110  Notify: Rapid Response  Name of Rapid Response RN Notified Saralyn Pilar  Date Rapid Response Notified 06/14/20  Time Rapid Response Notified 1100  Document  Patient Outcome Stabilized after interventions  Progress note created (see row info) Yes

## 2020-06-14 NOTE — Telephone Encounter (Signed)
ON-CALL CARDIOLOGY 06/14/20  Patient's name: Brittany Roberts.   MRN: [id number].    DOB: 09-25-1940 Primary care provider: [name], [name], MD. Primary cardiologist: Dr. Jorge Ny regarding this patient's care today: Patient's daughter called concerned as patient has been in the hospital for the last several days and is getting ready to be discharged. However patient's daughter concerned because patient's heart rate is varying. Patient was noted during hospital admission to have new onset paroxysmal atrial fibrillation. I explained a fib to patients daughter and that varying heart rate is to be expected. Patient has been started on Eliquis and metoprolol succinate. Will have our office schedule outpatient follow up.   Impression: No diagnosis found.  No orders of the defined types were placed in this encounter.   No orders of the defined types were placed in this encounter.   Recommendations: Outpatient follow up for new onset atrial fibrillation with Dr. Virgina Jock.   Telephone encounter total time: 8 minutes     Alethia Berthold, PA-C 06/14/2020, 4:37 PM Office: [phone]

## 2020-06-15 LAB — BASIC METABOLIC PANEL
Anion gap: 11 (ref 5–15)
BUN: 34 mg/dL — ABNORMAL HIGH (ref 8–23)
CO2: 19 mmol/L — ABNORMAL LOW (ref 22–32)
Calcium: 10 mg/dL (ref 8.9–10.3)
Chloride: 117 mmol/L — ABNORMAL HIGH (ref 98–111)
Creatinine, Ser: 1.93 mg/dL — ABNORMAL HIGH (ref 0.44–1.00)
GFR, Estimated: 26 mL/min — ABNORMAL LOW (ref >60)
Glucose, Bld: 137 mg/dL — ABNORMAL HIGH (ref 70–99)
Potassium: 4.2 mmol/L (ref 3.5–5.1)
Sodium: 147 mmol/L — ABNORMAL HIGH (ref 135–145)

## 2020-06-15 LAB — GLUCOSE, CAPILLARY
Glucose-Capillary: 142 mg/dL — ABNORMAL HIGH (ref 70–99)
Glucose-Capillary: 132 mg/dL — ABNORMAL HIGH (ref 70–99)
Glucose-Capillary: 172 mg/dL — ABNORMAL HIGH (ref 70–99)
Glucose-Capillary: 128 mg/dL — ABNORMAL HIGH (ref 70–99)
Glucose-Capillary: 99 mg/dL (ref 70–99)

## 2020-06-15 NOTE — Progress Notes (Signed)
Telemetry notified RN that this patient's heart rate was sustaining in the 40s for at least 2 minutes, the vital signs for this patient have been rechecked and are charted in the [facility], the heart rate was 52 and this patient is asymptomatic and still in the green mews following the full set of vitals. MD has been notified, no new orders have been placed at this time and RN will continue to monitor this patient

## 2020-06-15 NOTE — Telephone Encounter (Signed)
Please schedule f/ w/me or CC in next few weeks.  Thanks MJP

## 2020-06-15 NOTE — Progress Notes (Signed)
PROGRESS NOTE    Brittany Roberts  SAY:[id number] DOB: [birth year] DOA: 06/07/2020 PCP: [place], [name], MD    Brief Narrative:  Brittany Roberts is an 79 year old female with past medical history significant for CVA, chronic diastolic congestive heart failure, CAD s/p PCI, obesity, essential hypertension, type 2 diabetes mellitus, peripheral neuropathy, gout, chronic anxiety/depression, seizure disorder, GERD, asthma, memory deficits, lupus, history of LV apical thrombus status post 3 months of anticoagulation, Covid-19 viral infection March 2021 who presented to [facility] ED due to progressive confusion.  Due to mental status, history obtained from EDP, daughter at bedside and review of medical records.  Patient was previously in the ED few days prior and diagnosed with a UTI and sent home on Keflex.  Per daughter, confusion progressed leading to fall with unknown prolonged downtime.  Patient was found to have acute renal failure and A. fib which is a new diagnosis.  Hospitalist service consulted for further evaluation and management.   Assessment & Plan:   Active Problems:   Chronic kidney disease, stage III (moderate) (HCC)   Chronic respiratory failure with hypoxia, on home O2 therapy (HCC)   AMS (altered mental status)   Atrial fibrillation with RVR (HCC)   Acute metabolic encephalopathy, POA Urinary tract infection Patient presenting with progressive confusion, recently diagnosed with UTI and discharged home on Keflex.  No urine cultures obtained.  CT head with no acute intracranial findings, stable encephalomalacia right frontal lobe from prior infarct and stable atherosclerosis.  UDS negative.  Covid-19 PCR/influenza A/B negative.  Urine culture obtained 06/07/2020 with no growth.  Completed 3-day course of ceftriaxone.  Suspect underlying dementia with likely hospital-acquired delirium as contributing factors.  Blood culture 12/5 with no growth x 5 days.  MRI brain w/o contrast with no acute  findings. --Seroquel 12.5 mg p.o. nightly --Melatonin 3 mg p.o. nightly  Paroxysmal atrial fibrillation, new diagnosis Patient was noted to be in atrial fibrillation on presentation.  No known prior history but patient did have recent apical thrombus and was treated with Coumadin by cardiology, Dr. Abby Potash for 3 months.  TSH within normal limits. --Eliquis 2.5 mg p.o. twice daily --metoprolol succinate 100 mg p.o. daily --Started Cardizem 120 mg p.o. daily --Monitor on telemetry  Chronic hypoxic respiratory failure On 3 L nasal cannula at baseline.  Continue Dulera 100-5 mcg 2 puffs p.o. twice daily (substituted for Symbicort), montelukast 10 mg p.o. daily.  Chronic diastolic congestive heart failure TTE with LVEF 55-60%, no regional LV wall motion abnormalities, indeterminate LV diastolic parameters, IVC normal in size.  On metoprolol succinate 50 mg p.o. daily, indoor 30 mg p.o. daily, torsemide 20 mg p.o. twice daily and Lipitor at home. --Continue beta-blocker, metoprolol succinate 100 mg p.o. daily --Continue Lipitor 20 mg p.o. daily --Strict I's and O's and daily weights  Acute renal failure on CKD stage IIIb Baseline creatinine 1.6 with a GFR of 33.  Patient presented with a creatinine of 3.17 with a GFR of 14.  Etiology likely prerenal dehydration from poor oral intake in the preceding days prior to hospitalization.  Renal ultrasound with findings suggestive of medical renal disease and distended urinary bladder. --Cr 3.17>2.53>2.48>2.45>2.07>1.53>1.59>1.42>1.93 --Increase NS to 75mL per hour --Avoid nephrotoxins, renally dose all medications --Continue monitor BMP daily  Type 2 diabetes mellitus Hemoglobin A1c 8.5, not optimally controlled. --Lantus 14 units subcutaneously daily --Insulin sliding scale for further coverage --CBGs before every meal/at bedtime  Elevated calcium: Resolved Etiology likely secondary to dehydration.  PTH intact low  and vitamin D within normal  limits. --Calcium 10.0 today --Continue IV fluid hydration  Seizure disorder --Keppra 500 mg p.o. twice daily  Anxiety/depression Peripheral neuropathy --Cymbalta 60 mg p.o. daily --Gabapentin 100 mg p.o. nightly  GERD: Continue PPI  Hx LV apical thrombus Completed 3 months of anticoagulation previously.  Started on Eliquis 2.5 mg p.o. twice daily for A. fib as above.  History of CVA/CAD Not on antiplatelet or statin.  Unsure if she has any contraindications or intolerance to these.  Outpatient follow-up.  Obesity Body mass index is 49.34 kg/m.  Needs aggressive lifestyle changes/weight loss as this complicates all facets of care.  History of Covid-19 viral infection March 2021    DVT prophylaxis: Eliquis Code Status: Full code Family Communication: No family present at bedside this morning  Disposition Plan:  Status is: Inpatient  Remains inpatient appropriate because:Altered mental status, Unsafe d/c plan and Inpatient level of care appropriate due to severity of illness   Dispo:  Patient From: Home  Planned Disposition: Skilled Nursing Facility  Expected discharge date: 06/13/2020  Medically stable for discharge: Yes   Consultants:   none  Procedures:   TTE  Antimicrobials:   Ceftriaxone 12/4 - 12/7  Fosfomycin 12/4   Subjective: Patient seen and examined at bedside, resting comfortably.  Asking for some water this morning.  Heart rate better controlled on telemetry this morning.  No family present at bedside this morning.  Overnight, nursing reported decreased urine output with dark urine, has not been eating well and IV fluids increased.  Still awaiting SNF placement.  No other acute concerns per nursing staff this morning.  Objective: Vitals:   06/14/20 2005 06/14/20 2232 06/15/20 0513 06/15/20 0830  BP: 140/66 (!) 135/57 134/60 132/68  Pulse: 89 80 (!) 55   Resp: 19 17 20 18   Temp: 98.4 F (36.9 C) 98.7 F (37.1 C)  98.6 F (37 C)   TempSrc:    Axillary  SpO2: 96% 99%  98%  Weight:        Intake/Output Summary (Last 24 hours) at 06/15/2020 1001 Last data filed at 06/15/2020 0831 Gross per 24 hour  Intake 1246.32 ml  Output 650 ml  Net 596.32 ml   Filed Weights   06/12/20 0500 06/12/20 2021 06/14/20 0424  Weight: 129.7 kg 127.5 kg 128.4 kg    Examination:  General exam: Appears calm and comfortable, confused Respiratory system: Clear to auscultation. Respiratory effort normal.  On 3 L nasal cannula (baseline 3L [place]) Cardiovascular system: S1 & S2 heard, irregularly irregular rhythm, normal rate. No JVD, murmurs, rubs, gallops or clicks. No pedal edema. Gastrointestinal system: Abdomen is nondistended, soft and nontender. No organomegaly or masses felt. Normal bowel sounds heard. Central nervous system: Alert. No focal neurological deficits. Extremities: Symmetric 5 x 5 power. Skin: No rashes, lesions or ulcers Psychiatry: Judgement and insight appear poor.     Data Reviewed: I have personally reviewed following labs and imaging studies  CBC: Recent Labs  Lab 06/09/20 0228 06/10/20 0249 06/11/20 0428  WBC 9.7 10.2 10.9*  NEUTROABS 6.0 6.0  --   HGB 10.4* 9.5* 9.2*  HCT 30.8* 28.3* 26.5*  MCV 75.7* 75.7* 75.3*  PLT 171 159 154   Basic Metabolic Panel: Recent Labs  Lab 06/11/20 0428 06/12/20 0109 06/13/20 0239 06/14/20 0358 06/15/20 0250  NA 143 142 144 144 147*  K 3.9 4.2 4.0 4.0 4.2  CL 109 109 110 112* 117*  CO2 25 26 24  19* 19*  GLUCOSE 131* 151* 167* 199* 137*  BUN 48* 38* 29* 26* 34*  CREATININE 2.07* 1.53* 1.59* 1.42* 1.93*  CALCIUM 11.2* 11.2* 10.7* 10.4* 10.0  MG  --  1.8 1.6*  --   --    GFR: Estimated Creatinine Clearance: 30.6 mL/min (A) (by C-G formula based on SCr of 1.93 mg/dL (H)). Liver Function Tests: No results for input(s): AST, ALT, ALKPHOS, BILITOT, PROT, ALBUMIN in the last 168 hours. No results for input(s): LIPASE, AMYLASE in the last 168 hours. No results  for input(s): AMMONIA in the last 168 hours. Coagulation Profile: No results for input(s): INR, PROTIME in the last 168 hours. Cardiac Enzymes: No results for input(s): CKTOTAL, CKMB, CKMBINDEX, TROPONINI in the last 168 hours. BNP (last 3 results) No results for input(s): PROBNP in the last 8760 hours. HbA1C: No results for input(s): HGBA1C in the last 72 hours. CBG: Recent Labs  Lab 06/14/20 0602 06/14/20 1121 06/14/20 1632 06/15/20 0148 06/15/20 0746  GLUCAP 197* 171* 153* 142* 132*   Lipid Profile: No results for input(s): CHOL, HDL, LDLCALC, TRIG, CHOLHDL, LDLDIRECT in the last 72 hours. Thyroid Function Tests: No results for input(s): TSH, T4TOTAL, FREET4, T3FREE, THYROIDAB in the last 72 hours. Anemia Panel: No results for input(s): VITAMINB12, FOLATE, FERRITIN, TIBC, IRON, RETICCTPCT in the last 72 hours. Sepsis Labs: No results for input(s): PROCALCITON, LATICACIDVEN in the last 168 hours.  Recent Results (from the past 240 hour(s))  Resp Panel by RT-PCR (Flu A&B, Covid) Nasopharyngeal Swab     Status: None   Collection Time: 06/07/20  4:20 PM   Specimen: Nasopharyngeal Swab; Nasopharyngeal(NP) swabs in vial transport medium  Result Value Ref Range Status   SARS Coronavirus 2 by RT PCR NEGATIVE NEGATIVE Final    Comment: (NOTE) SARS-CoV-2 target nucleic acids are NOT DETECTED.  The SARS-CoV-2 RNA is generally detectable in upper respiratory specimens during the acute phase of infection. The lowest concentration of SARS-CoV-2 viral copies this assay can detect is 138 copies/mL. A negative result does not preclude SARS-Cov-2 infection and should not be used as the sole basis for treatment or other patient management decisions. A negative result may occur with  improper specimen collection/handling, submission of specimen other than nasopharyngeal swab, presence of viral mutation(s) within the areas targeted by this assay, and inadequate number of viral copies(<138  copies/mL). A negative result must be combined with clinical observations, patient history, and epidemiological information. The expected result is Negative.  Fact Sheet for Patients:  BloggerCourse.com  Fact Sheet for Healthcare Providers:  SeriousBroker.it  This test is no t yet approved or cleared by the [place] FDA and  has been authorized for detection and/or diagnosis of SARS-CoV-2 by FDA under an Emergency Use Authorization (EUA). This EUA will remain  in effect (meaning this test can be used) for the duration of the COVID-19 declaration under Section 564(b)(1) of the Act, 21 U.S.C.section 360bbb-3(b)(1), unless the authorization is terminated  or revoked sooner.       Influenza A by PCR NEGATIVE NEGATIVE Final   Influenza B by PCR NEGATIVE NEGATIVE Final    Comment: (NOTE) The Xpert Xpress SARS-CoV-2/FLU/RSV plus assay is intended as an aid in the diagnosis of influenza from Nasopharyngeal swab specimens and should not be used as a sole basis for treatment. Nasal washings and aspirates are unacceptable for Xpert Xpress SARS-CoV-2/FLU/RSV testing.  Fact Sheet for Patients: BloggerCourse.com  Fact Sheet for Healthcare Providers: SeriousBroker.it  This test is not yet approved or cleared  by the [place] and has been authorized for detection and/or diagnosis of SARS-CoV-2 by FDA under an Emergency Use Authorization (EUA). This EUA will remain in effect (meaning this test can be used) for the duration of the COVID-19 declaration under Section 564(b)(1) of the Act, 21 U.S.C. section 360bbb-3(b)(1), unless the authorization is terminated or revoked.  Performed at [facility], [address], [place], [place] [address]   Urine culture     Status: None   Collection Time: 06/07/20  6:16 PM   Specimen: Urine, Random  Result Value Ref Range Status    Specimen Description URINE, RANDOM  Final   Special Requests NONE  Final   Culture   Final    NO GROWTH Performed at [facility], [address], [place], [place] [address]    Report Status 06/09/2020 FINAL  Final  Culture, blood (routine x 2)     Status: None   Collection Time: 06/08/20  4:46 AM   Specimen: BLOOD LEFT HAND  Result Value Ref Range Status   Specimen Description BLOOD LEFT HAND  Final   Special Requests   Final    BOTTLES DRAWN AEROBIC ONLY Blood Culture results may not be optimal due to an inadequate volume of blood received in culture bottles   Culture   Final    NO GROWTH 5 DAYS Performed at [facility], [address], [place], [place] [address]    Report Status 06/13/2020 FINAL  Final  SARS Coronavirus 2 by RT PCR (hospital order, performed in [facility] hospital lab) Nasopharyngeal Nasopharyngeal Swab     Status: None   Collection Time: 06/13/20  3:23 PM   Specimen: Nasopharyngeal Swab  Result Value Ref Range Status   SARS Coronavirus 2 NEGATIVE NEGATIVE Final    Comment: (NOTE) SARS-CoV-2 target nucleic acids are NOT DETECTED.  The SARS-CoV-2 RNA is generally detectable in upper and lower respiratory specimens during the acute phase of infection. The lowest concentration of SARS-CoV-2 viral copies this assay can detect is 250 copies / mL. A negative result does not preclude SARS-CoV-2 infection and should not be used as the sole basis for treatment or other patient management decisions.  A negative result may occur with improper specimen collection / handling, submission of specimen other than nasopharyngeal swab, presence of viral mutation(s) within the areas targeted by this assay, and inadequate number of viral copies (<250 copies / mL). A negative result must be combined with clinical observations, patient history, and epidemiological information.  Fact Sheet for Patients:   BoilerBrush.com.cy  Fact Sheet for  Healthcare Providers: https://pope.com/  This test is not yet approved or  cleared by the [place] FDA and has been authorized for detection and/or diagnosis of SARS-CoV-2 by FDA under an Emergency Use Authorization (EUA).  This EUA will remain in effect (meaning this test can be used) for the duration of the COVID-19 declaration under Section 564(b)(1) of the Act, 21 U.S.C. section 360bbb-3(b)(1), unless the authorization is terminated or revoked sooner.  Performed at [facility], [address] [place], [place] [address]          Radiology Studies: No results found.      Scheduled Meds: . allopurinol  200 mg Oral Daily  . apixaban  2.5 mg Oral BID  . atorvastatin  20 mg Oral Daily  . diltiazem  120 mg Oral Daily  . DULoxetine  60 mg Oral Daily  . ferrous sulfate  325 mg Oral BID WC  . gabapentin  100 mg Oral QHS  . insulin aspart  0-5 Units Subcutaneous QHS  . insulin aspart  0-9 Units Subcutaneous TID WC  . insulin glargine  14 Units Subcutaneous Daily  . levETIRAcetam  500 mg Oral BID  . melatonin  3 mg Oral QHS  . metoprolol succinate  100 mg Oral Daily  . mometasone-formoterol  2 puff Inhalation BID  . montelukast  10 mg Oral QHS  . pantoprazole  40 mg Oral Daily  . QUEtiapine  12.5 mg Oral QHS   Continuous Infusions: . sodium chloride 75 mL/hr at 06/15/20 0255     LOS: 8 days    Time spent: 34 minutes spent on chart review, discussion with nursing staff, consultants, updating family and interview/physical exam; more than 50% of that time was spent in counseling and/or coordination of care.    Alvira Philips [place], DO [facility] Available via Epic secure chat 7am-7pm After these hours, please refer to coverage provider listed on [email or web address] 06/15/2020, 10:01 AM

## 2020-06-15 NOTE — TOC Progression Note (Signed)
Transition of Care [facility]) - Progression Note    Patient Details  Name: Brittany Roberts MRN: [id number] Date of Birth: 1940-08-18  Transition of Care [facility]) CM/SW [place], [place] Phone Number: 06/15/2020, 10:01 AM  Clinical Narrative:     CSW followed up with pt's family yesterday [place] barriers. Helene Kelp is concerned about pt agitation and declining at this time. Family ok with pt going to [facility], but they have halted acceptances. Pt's insurance stated that auth for [facility] is good through the 14th. CSW attempted to contact [facility] for clarification on if they would consider pt with improvement, left voicemail. A new insurance Josem Kaufmann will have to be started if a new facility is picked. CSW will continue to follow for improvement and see if Helene [facility]p will continue to follow.   Expected Discharge Plan: [place] Barriers to Discharge: Continued Medical Work up  Expected Discharge Plan and Services Expected Discharge Plan: [place] In-house Referral: Clinical Social Work   Post Acute Care Choice: [place] Living arrangements for the past 2 months: Apartment Expected Discharge Date: 06/13/20                                     Social Determinants of Health (SDOH) Interventions    Readmission Risk Interventions No flowsheet data found.

## 2020-06-16 ENCOUNTER — Inpatient Hospital Stay: Payer: Medicare Other | Admitting: Family Medicine

## 2020-06-16 LAB — SARS CORONAVIRUS 2 BY RT PCR (HOSPITAL ORDER, PERFORMED IN ~~LOC~~ HOSPITAL LAB): SARS Coronavirus 2: NEGATIVE

## 2020-06-16 LAB — BASIC METABOLIC PANEL
Anion gap: 10 (ref 5–15)
BUN: 32 mg/dL — ABNORMAL HIGH (ref 8–23)
CO2: 19 mmol/L — ABNORMAL LOW (ref 22–32)
Calcium: 9.5 mg/dL (ref 8.9–10.3)
Chloride: 115 mmol/L — ABNORMAL HIGH (ref 98–111)
Creatinine, Ser: 1.8 mg/dL — ABNORMAL HIGH (ref 0.44–1.00)
GFR, Estimated: 28 mL/min — ABNORMAL LOW (ref >60)
Glucose, Bld: 78 mg/dL (ref 70–99)
Potassium: 3.7 mmol/L (ref 3.5–5.1)
Sodium: 144 mmol/L (ref 135–145)

## 2020-06-16 LAB — GLUCOSE, CAPILLARY
Glucose-Capillary: 72 mg/dL (ref 70–99)
Glucose-Capillary: 118 mg/dL — ABNORMAL HIGH (ref 70–99)
Glucose-Capillary: 122 mg/dL — ABNORMAL HIGH (ref 70–99)
Glucose-Capillary: 90 mg/dL (ref 70–99)

## 2020-06-16 MED ORDER — MELATONIN 3 MG PO TABS: 3.0000 mg | ORAL_TABLET | Freq: Every day | ORAL | 0 refills | Status: DC

## 2020-06-16 NOTE — Progress Notes (Signed)
Physical Therapy Treatment Patient Details Name: Brittany Roberts MRN: [id number] DOB: 11-Jul-1940 Today's Date: 06/16/2020    History of Present Illness Brittany Roberts is a 79 y.o. female with medical history significant for prior CVA, chronic diastolic CHF, coronary artery disease status post PCI, obesity, essential hypertension, type 2 diabetes, peripheral neuropathy, gout, chronic anxiety/depression, seizure disorder, GERD, asthma, memory deficit/not officially diagnosed with dementia, lupus, LV apical thrombus completed 3 months of anticoagulation, COVID-19 viral infection March 2021, who presented to [facility] ED from home due to worsening altered mental status.  History is obtained from [place], her daughter at bedside, and review of medical records.  Patient was seen in the ED few days prior to this presentation for the same, was found to have a UTI and was sent home on Keflex.  Per daughter she has had worsening confusion.  She had a fall today was possibly down for more than an hour.  CT head and cervical spine nonacute.  Chest x-ray consistent with pulmonary edema.  Work-up in the ED revealed AKI.    PT Comments    Pt very pleasant and cooperative today. Pt soiled in stool upon PT arrival to room. Pt participating well in x2 sit<>stand transfers, requiring mod assist especially when dynamically pivoting to [facility] and recliner. PT continuing to recommend SNF level of care post-acutely, will continue to follow.     Follow Up Recommendations  SNF     Equipment Recommendations  Rolling walker with 5" wheels;3in1 (PT) ([place] -- may consider Insurance claims handler)    Recommendations for Other Services       Precautions / Restrictions Precautions Precautions: Fall Precaution Comments: on chronic O2 Restrictions Weight Bearing Restrictions: No    Mobility  Bed Mobility Overal bed mobility: Needs Assistance Bed Mobility: Supine to Sit     Supine to sit: Max assist;HOB elevated     General bed mobility  comments: Max assist for trunk elevation, LE translation to EOB, and scooting to EOB. Very increased time to perform.  Transfers Overall transfer level: Needs assistance Equipment used: Rolling walker (2 wheeled) Transfers: Stand Pivot Transfers;Sit to/from Stand Sit to Stand: Mod assist Stand pivot transfers: Mod assist;+2 safety/equipment       General transfer comment: Mod assist for power up, steadying, and safe transfer to destination surface. Pt requires tactile facilitation of trunk and hips to sit safely on BSC/chair. STS and pivot x2, from bed>BSC, BSC>recliner. Pt's daughter standing in front of pt to facilitate pt listening during mobility.  Ambulation/Gait             General Gait Details: NT this day, pt fatigued   Stairs             Wheelchair Mobility    Modified Rankin (Stroke Patients Only)       Balance Overall balance assessment: Needs assistance Sitting-balance support: Bilateral upper extremity supported;Feet supported Sitting balance-Leahy Scale: Fair Sitting balance - Comments: able to sit EOB  without PT assist   Standing balance support: Bilateral upper extremity supported Standing balance-Leahy Scale: Poor Standing balance comment: reliant on external support                            Cognition Arousal/Alertness: Awake/alert Behavior During Therapy: WFL for tasks assessed/performed Overall Cognitive Status: History of cognitive impairments - at baseline Area of Impairment: Following commands;Problem solving;Attention;Memory;Safety/judgement;Awareness;Orientation  Orientation Level: Disoriented to;Situation;Time Current Attention Level: Sustained Memory: Decreased short-term memory;Decreased recall of precautions Following Commands: Follows one step commands inconsistently;Follows one step commands with increased time Safety/Judgement: Decreased awareness of safety;Decreased awareness of  deficits Awareness: Emergent Problem Solving: Decreased initiation;Slow processing;Difficulty sequencing;Requires verbal cues;Requires tactile cues General Comments: Pt pleasant during session. Pt requires max multimodal cuing to stay on task, especially when mobilizing. Pt soiled in BM upon arrival to room, requires clean up assist from PT. Pt lacking safety awareness during transfer, attempting to sit before sitting surface reached.      Exercises      General Comments General comments (skin integrity, edema, etc.): on 2LO2 (chronic), pt's daughter in room and supportive      Pertinent Vitals/Pain Pain Assessment: Faces Faces Pain Scale: Hurts little more Pain Location: LEs, post-mobility Pain Descriptors / Indicators: Discomfort;Sore Pain Intervention(s): Limited activity within patient's tolerance;Monitored during session;Repositioned    Home Living                      Prior Function            PT Goals (current goals can now be found in the care plan section) Acute Rehab PT Goals Patient Stated Goal: get rehab andgo home PT Goal Formulation: With patient/family Time For Goal Achievement: 06/22/20 Potential to Achieve Goals: Good Progress towards PT goals: Progressing toward goals    Frequency    Min 2X/week      PT Plan Current plan remains appropriate    Co-evaluation              AM-PAC PT "6 Clicks" Mobility   Outcome Measure  Help needed turning from your back to your side while in a flat bed without using bedrails?: A Little Help needed moving from lying on your back to sitting on the side of a flat bed without using bedrails?: A Lot Help needed moving to and from a bed to a chair (including a wheelchair)?: A Lot Help needed standing up from a chair using your arms (e.g., wheelchair or bedside chair)?: A Lot Help needed to walk in hospital room?: A Lot Help needed climbing 3-5 steps with a railing? : Total 6 Click Score: 12    End of  Session Equipment Utilized During Treatment: Oxygen Activity Tolerance: Patient limited by fatigue Patient left: with call bell/phone within reach;with family/visitor present;in chair;with chair alarm set Nurse Communication: Mobility status;Other (comment) (NT found pt's IV out upon PT arrival to room) PT Visit Diagnosis: Unsteadiness on feet (R26.81);Other abnormalities of gait and mobility (R26.89);Muscle weakness (generalized) (M62.81)     Time: 3545-6256 PT Time Calculation (min) (ACUTE ONLY): 31 min  Charges:  $Therapeutic Activity: 23-37 mins                     Stacie Glaze, PT Acute Rehabilitation Services Pager [phone]  Office [phone]    Roxine Caddy E Ruffin Pyo 06/16/2020, 10:36 AM

## 2020-06-16 NOTE — TOC Progression Note (Signed)
Transition of Care [facility]) - Progression Note    Patient Details  Name: Brittany Roberts MRN: [id number] Date of Birth: 05-Jan-1941  Transition of Care [facility]) CM/SW [place], [place] Phone Number: 06/16/2020, 8:50 AM  Clinical Narrative:     CSW received return call from [place] and they confirmed they are no longer offering a bed, even with clinical improvement. [employer] unable to switch bed choice on Auth, new auth will need to be started, and new bed choice updated. SW will continue to follow for DC planning.  Expected Discharge Plan: [place] Barriers to Discharge: Continued Medical Work up  Expected Discharge Plan and Services Expected Discharge Plan: [place] In-house Referral: Clinical Social Work   Post Acute Care Choice: [place] Living arrangements for the past 2 months: Apartment Expected Discharge Date: 06/13/20                                     Social Determinants of Health (SDOH) Interventions    Readmission Risk Interventions No flowsheet data found.

## 2020-06-16 NOTE — TOC Progression Note (Signed)
Transition of Care [facility]) - Progression Note    Patient Details  Name: Brittany Roberts MRN: [id number] Date of Birth: 07-30-1940  Transition of Care [facility]) CM/SW Contact  Sharlet Salina Mila Homer, LCSW Phone Number: 06/16/2020, 1:39 PM  Clinical Narrative:  Patient cannot discharge to [facility] or [place] per weekend CSW note. Checked facility responses and [facility] is a new bed offer. CSW visited room and talked with daughter [name] regarding facility responses. Initially daughter advised CSW that she was going back to [place] to talk with them. She was advised that [facility] cannot accept patient due to COVID and [facility] made a bed offer. Daughter agreeable to [facility].  Call made to [facility], admissions director at [facility] regarding patient and they can take her. CSW informed [place] that a new insurance auth will be requested. Call made to [employer] and new auth request initiated. Reference number [id number] and fax number: [phone]. Clinicals faxed and CSW will await call from [place].       Expected Discharge Plan: [place] Barriers to Discharge: Continued Medical Work up  Expected Discharge Plan and Services Expected Discharge Plan: [place] In-house Referral: Clinical Social Work   Post Acute Care Choice: [place] Living arrangements for the past 2 months: Apartment Expected Discharge Date: 06/13/20                                     Social Determinants of Health (SDOH) Interventions    Readmission Risk Interventions No flowsheet data found.

## 2020-06-16 NOTE — TOC Transition Note (Addendum)
Transition of Care (TOC) - CM/SW Discharge Note *Discharging to [place] on 12/14 *10 am [facility] pick-up *Room 164 *Phone number for Report: [phone]   Patient Details  Name: Brittany Roberts MRN: [id number] Date of Birth: 1941/05/25  Transition of Care [facility]) CM/SW Contact:  Sable Feil, LCSW Phone Number: 06/16/2020, 4:01 PM   Clinical Narrative:  Patient medically stable for discharge and going to [facility] at [facility] today. Daughter was agreeable with [facility] and was advised when insurance authorization received. Discharge clinicals transmitted to facility, rapid COVID ordered and nurse provided with information to call report. Mr. Vultaggio will be transported by non-emergency ambulance transport.   Insurance auth information: Reference number: [id number], effective 06/16/20. Auth ID not yet generated. Approved for 3 and next review date is 12/15. Fax continued stay clinicals to [phone]. Case manager Luz Brazen.      4:12 pm: Admissions director provided with auth information. 4:53 pm: Talked with admissions director [place] regarding discharge. She expressed concern regarding the patient arriving at their facility very late ([facility] transport arranged 4:14 pm and 11 transports ahead of patient) and they not being able to get her medications tonight, as they can't order the medications until she arrives at the facility. She requested that patient come in the morning.  4:58 pm: MD contacted, updated and expressed agreement with patient discharging in the morning. [facility] contacted to cancel today's transport and schedule pick-up for 12/14 at 10 am.   5:05 pm: Contacted daughter [name] [phone]) and update provided regarding discharge. *Charge nurse updated.  Final next level of care: [place] ([facility]) Barriers to Discharge: Barriers Resolved   Patient Goals and CMS Choice Patient states their goals for this hospitalization  and ongoing recovery are:: Daughter and patient agreeable to [place] rehab CMS Medicare.gov Compare Post Acute Care list provided to:: Patient Represenative (must comment) (Daughters provided with Medicare.gov SNF list) Choice offered to / list presented to : Adult Children  Discharge Placement   Existing PASRR number confirmed : 06/09/20          Patient chooses bed at: Other - please specify in the comment section below: ([facility] at [facility]) Patient to be transferred to facility by: Non-emergency ambulance transport Name of family member notified: [name] - daughter. Talked with her at the bedside Patient and family notified of of transfer: 06/16/20  Discharge Plan and Services In-house Referral: Clinical Social Work   Post Acute Care Choice: [place]                               Social Determinants of Health (SDOH) Interventions  No SDOH interventions requested or needed at discharge.   Readmission Risk Interventions No flowsheet data found.

## 2020-06-16 NOTE — Progress Notes (Signed)
PROGRESS NOTE    Brittany Roberts  UEA:[id number] DOB: 1941/02/09 DOA: 06/07/2020 PCP: [place], [name], MD    Brief Narrative:  Brittany Roberts is an 79 year old female with past medical history significant for CVA, chronic diastolic congestive heart failure, CAD s/p PCI, obesity, essential hypertension, type 2 diabetes mellitus, peripheral neuropathy, gout, chronic anxiety/depression, seizure disorder, GERD, asthma, memory deficits, lupus, history of LV apical thrombus status post 3 months of anticoagulation, Covid-19 viral infection March 2021 who [facility] due to progressive confusion.  Due to mental status, history obtained from EDP, daughter at bedside and review of medical records.  Patient was previously in the ED few days prior and diagnosed with a UTI and sent home on Keflex.  Per daughter, confusion progressed leading to fall with unknown prolonged downtime.  Patient was found to have acute renal failure and A. fib which is a new diagnosis.  Hospitalist service consulted for further evaluation and management.   Assessment & Plan:   Active Problems:   Chronic kidney disease, stage III (moderate) (HCC)   Chronic respiratory failure with hypoxia, on home O2 therapy (HCC)   AMS (altered mental status)   Atrial fibrillation with RVR (HCC)   Acute metabolic encephalopathy, POA Urinary tract infection Patient presenting with progressive confusion, recently diagnosed with UTI and discharged home on Keflex.  No urine cultures obtained.  CT head with no acute intracranial findings, stable encephalomalacia right frontal lobe from prior infarct and stable atherosclerosis.  UDS negative.  Covid-19 PCR/influenza A/B negative.  Urine culture obtained 06/07/2020 with no growth.  Completed 3-day course of ceftriaxone.  Suspect underlying dementia with likely hospital-acquired delirium as contributing factors.  Blood culture 12/5 with no growth x 5 days.  MRI brain w/o contrast with no acute  findings. --Seroquel 12.5 mg p.o. nightly --Melatonin 3 mg p.o. nightly  Paroxysmal atrial fibrillation w/ RVR, new diagnosis Patient was noted to be in atrial fibrillation on presentation.  No known prior history but patient did have recent apical thrombus and was treated with Coumadin by cardiology, Dr. Abby Potash for 3 months.  TSH within normal limits. --Eliquis 2.5 mg p.o. twice daily --Hold metoprolol succinate today given heart rate sustaining 40s-60s-24 hours. --Metoprolol 5mg  IV q4h prn for sustained heart rate > 120 for 2-minute --Monitor on telemetry  Chronic hypoxic respiratory failure On 3 L nasal cannula at baseline.  Continue Dulera 100-5 mcg 2 puffs p.o. twice daily (substituted for Symbicort), montelukast 10 mg p.o. daily.  Chronic diastolic congestive heart failure TTE with LVEF 55-60%, no regional LV wall motion abnormalities, indeterminate LV diastolic parameters, IVC normal in size.  On metoprolol succinate 50 mg p.o. daily, indoor 30 mg p.o. daily, torsemide 20 mg p.o. twice daily and Lipitor at home. --Continue beta-blocker, metoprolol succinate 100 mg p.o. daily --Continue Lipitor 20 mg p.o. daily --Strict I's and O's and daily weights  Acute renal failure on CKD stage IIIb Baseline creatinine 1.6 with a GFR of 33.  Patient presented with a creatinine of 3.17 with a GFR of 14.  Etiology likely prerenal dehydration from poor oral intake in the preceding days prior to hospitalization.  Renal ultrasound with findings suggestive of medical renal disease and distended urinary bladder. --Cr 3.17>2.53>2.48>2.45>2.07>1.53>1.59>1.42>1.93>1.80 --Increase NS to per hour --Avoid nephrotoxins, renally dose all medications --Continue monitor BMP daily  Type 2 diabetes mellitus Hemoglobin A1c 8.5, not optimally controlled. --Lantus 14 units subcutaneously daily --Insulin sliding scale for further coverage --CBGs before every meal/at bedtime  Elevated calcium:  Resolved Etiology likely secondary to dehydration.  PTH intact low and vitamin D within normal limits. --Calcium 9.5 today --Continue IV fluid hydration  Seizure disorder --Keppra 500 mg p.o. twice daily  Anxiety/depression Peripheral neuropathy --Cymbalta 60 mg p.o. daily --Gabapentin 100 mg p.o. nightly  GERD: Continue PPI  Hx LV apical thrombus Completed 3 months of anticoagulation previously.  Started on Eliquis 2.5 mg p.o. twice daily for A. fib as above.  History of CVA/CAD Not on antiplatelet or statin.  Unsure if she has any contraindications or intolerance to these.  Outpatient follow-up.  Obesity Body mass index is 47.55 kg/m.  Needs aggressive lifestyle changes/weight loss as this complicates all facets of care.  History of Covid-19 viral infection March 2021    DVT prophylaxis: Eliquis Code Status: Full code Family Communication: No family present at bedside this morning  Disposition Plan:  Status is: Inpatient  Remains inpatient appropriate because:Altered mental status, Unsafe d/c plan and Inpatient level of care appropriate due to severity of illness   Dispo:  Patient From: Home  Planned Disposition: Skilled Nursing Facility  Expected discharge date: 06/18/2020  Medically stable for discharge: Yes   Consultants:   none  Procedures:   TTE  Antimicrobials:   Ceftriaxone 12/4 - 12/7  Fosfomycin 12/4   Subjective: Patient seen and examined at bedside, resting comfortably.  Pleasantly confused.  No family present at bedside this morning.  Awaiting SNF placement.  No acute concerns overnight per nursing staff.  Objective: Vitals:   06/15/20 2023 06/15/20 2146 06/16/20 0530 06/16/20 0951  BP:  (!) 141/82 135/70 124/60  Pulse:  (!) 57 60 (!) 54  Resp:  19 19 18   Temp:  98.4 F (36.9 C) 97.9 F (36.6 C) 98.1 F (36.7 C)  TempSrc:   Oral   SpO2: 99% 98% 99% 100%  Weight:  123.7 kg      Intake/Output Summary (Last 24 hours) at  06/16/2020 1051 Last data filed at 06/16/2020 0900 Gross per 24 hour  Intake 2818.63 ml  Output 225 ml  Net 2593.63 ml   Filed Weights   06/12/20 2021 06/14/20 0424 06/15/20 2146  Weight: 127.5 kg 128.4 kg 123.7 kg    Examination:  General exam: Appears calm and comfortable, confused Respiratory system: Clear to auscultation. Respiratory effort normal.  On 3 L nasal cannula with SPO2 99% (baseline 3L [place]) Cardiovascular system: S1 & S2 heard, irregularly irregular rhythm, normal rate. No JVD, murmurs, rubs, gallops or clicks. No pedal edema. Gastrointestinal system: Abdomen is nondistended, soft and nontender. No organomegaly or masses felt. Normal bowel sounds heard. Central nervous system: Alert. No focal neurological deficits. Extremities: Symmetric 5 x 5 power. Skin: No rashes, lesions or ulcers Psychiatry: Judgement and insight appear poor.     Data Reviewed: I have personally reviewed following labs and imaging studies  CBC: Recent Labs  Lab 06/10/20 0249 06/11/20 0428  WBC 10.2 10.9*  NEUTROABS 6.0  --   HGB 9.5* 9.2*  HCT 28.3* 26.5*  MCV 75.7* 75.3*  PLT 159 154   Basic Metabolic Panel: Recent Labs  Lab 06/12/20 0109 06/13/20 0239 06/14/20 0358 06/15/20 0250 06/16/20 0325  NA 142 144 144 147* 144  K 4.2 4.0 4.0 4.2 3.7  CL 109 110 112* 117* 115*  CO2 26 24 19* 19* 19*  GLUCOSE 151* 167* 199* 137* 78  BUN 38* 29* 26* 34* 32*  CREATININE 1.53* 1.59* 1.42* 1.93* 1.80*  CALCIUM 11.2* 10.7* 10.4* 10.0  9.5  MG 1.8 1.6*  --   --   --    GFR: Estimated Creatinine Clearance: 32.1 mL/min (A) (by C-G formula based on SCr of 1.8 mg/dL (H)). Liver Function Tests: No results for input(s): AST, ALT, ALKPHOS, BILITOT, PROT, ALBUMIN in the last 168 hours. No results for input(s): LIPASE, AMYLASE in the last 168 hours. No results for input(s): AMMONIA in the last 168 hours. Coagulation Profile: No results for input(s): INR, PROTIME in the last 168 hours. Cardiac  Enzymes: No results for input(s): CKTOTAL, CKMB, CKMBINDEX, TROPONINI in the last 168 hours. BNP (last 3 results) No results for input(s): PROBNP in the last 8760 hours. HbA1C: No results for input(s): HGBA1C in the last 72 hours. CBG: Recent Labs  Lab 06/15/20 0746 06/15/20 1116 06/15/20 1634 06/15/20 2146 06/16/20 0700  GLUCAP 132* 172* 128* 99 72   Lipid Profile: No results for input(s): CHOL, HDL, LDLCALC, TRIG, CHOLHDL, LDLDIRECT in the last 72 hours. Thyroid Function Tests: No results for input(s): TSH, T4TOTAL, FREET4, T3FREE, THYROIDAB in the last 72 hours. Anemia Panel: No results for input(s): VITAMINB12, FOLATE, FERRITIN, TIBC, IRON, RETICCTPCT in the last 72 hours. Sepsis Labs: No results for input(s): PROCALCITON, LATICACIDVEN in the last 168 hours.  Recent Results (from the past 240 hour(s))  Resp Panel by RT-PCR (Flu A&B, Covid) Nasopharyngeal Swab     Status: None   Collection Time: 06/07/20  4:20 PM   Specimen: Nasopharyngeal Swab; Nasopharyngeal(NP) swabs in vial transport medium  Result Value Ref Range Status   SARS Coronavirus 2 by RT PCR NEGATIVE NEGATIVE Final    Comment: (NOTE) SARS-CoV-2 target nucleic acids are NOT DETECTED.  The SARS-CoV-2 RNA is generally detectable in upper respiratory specimens during the acute phase of infection. The lowest concentration of SARS-CoV-2 viral copies this assay can detect is 138 copies/mL. A negative result does not preclude SARS-Cov-2 infection and should not be used as the sole basis for treatment or other patient management decisions. A negative result may occur with  improper specimen collection/handling, submission of specimen other than nasopharyngeal swab, presence of viral mutation(s) within the areas targeted by this assay, and inadequate number of viral copies(<138 copies/mL). A negative result must be combined with clinical observations, patient history, and epidemiological information. The expected  result is Negative.  Fact Sheet for Patients:  BloggerCourse.com  Fact Sheet for Healthcare Providers:  SeriousBroker.it  This test is no t yet approved or cleared by the [place] FDA and  has been authorized for detection and/or diagnosis of SARS-CoV-2 by FDA under an Emergency Use Authorization (EUA). This EUA will remain  in effect (meaning this test can be used) for the duration of the COVID-19 declaration under Section 564(b)(1) of the Act, 21 U.S.C.section 360bbb-3(b)(1), unless the authorization is terminated  or revoked sooner.       Influenza A by PCR NEGATIVE NEGATIVE Final   Influenza B by PCR NEGATIVE NEGATIVE Final    Comment: (NOTE) The Xpert Xpress SARS-CoV-2/FLU/RSV plus assay is intended as an aid in the diagnosis of influenza from Nasopharyngeal swab specimens and should not be used as a sole basis for treatment. Nasal washings and aspirates are unacceptable for Xpert Xpress SARS-CoV-2/FLU/RSV testing.  Fact Sheet for Patients: BloggerCourse.com  Fact Sheet for Healthcare Providers: SeriousBroker.it  This test is not yet approved or cleared by the [place] FDA and has been authorized for detection and/or diagnosis of SARS-CoV-2 by FDA under an Emergency Use Authorization (EUA). This EUA will  remain in effect (meaning this test can be used) for the duration of the COVID-19 declaration under Section 564(b)(1) of the Act, 21 U.S.C. section 360bbb-3(b)(1), unless the authorization is terminated or revoked.  Performed at [facility] Lab, [address], [place], [place] [address]   Urine culture     Status: None   Collection Time: 06/07/20  6:16 PM   Specimen: Urine, Random  Result Value Ref Range Status   Specimen Description URINE, RANDOM  Final   Special Requests NONE  Final   Culture   Final    NO GROWTH Performed at [facility]  Lab, [address], [place], [place] [address]    Report Status 06/09/2020 FINAL  Final  Culture, blood (routine x 2)     Status: None   Collection Time: 06/08/20  4:46 AM   Specimen: BLOOD LEFT HAND  Result Value Ref Range Status   Specimen Description BLOOD LEFT HAND  Final   Special Requests   Final    BOTTLES DRAWN AEROBIC ONLY Blood Culture results may not be optimal due to an inadequate volume of blood received in culture bottles   Culture   Final    NO GROWTH 5 DAYS Performed at [facility] Lab, [address] [place], [place] [address]    Report Status 06/13/2020 FINAL  Final  SARS Coronavirus 2 by RT PCR (hospital order, performed in [facility] hospital lab) Nasopharyngeal Nasopharyngeal Swab     Status: None   Collection Time: 06/13/20  3:23 PM   Specimen: Nasopharyngeal Swab  Result Value Ref Range Status   SARS Coronavirus 2 NEGATIVE NEGATIVE Final    Comment: (NOTE) SARS-CoV-2 target nucleic acids are NOT DETECTED.  The SARS-CoV-2 RNA is generally detectable in upper and lower respiratory specimens during the acute phase of infection. The lowest concentration of SARS-CoV-2 viral copies this assay can detect is 250 copies / mL. A negative result does not preclude SARS-CoV-2 infection and should not be used as the sole basis for treatment or other patient management decisions.  A negative result may occur with improper specimen collection / handling, submission of specimen other than nasopharyngeal swab, presence of viral mutation(s) within the areas targeted by this assay, and inadequate number of viral copies (<250 copies / mL). A negative result must be combined with clinical observations, patient history, and epidemiological information.  Fact Sheet for Patients:   BoilerBrush.com.cy  Fact Sheet for Healthcare Providers: https://pope.com/  This test is not yet approved or  cleared by the [place] FDA and has  been authorized for detection and/or diagnosis of SARS-CoV-2 by FDA under an Emergency Use Authorization (EUA).  This EUA will remain in effect (meaning this test can be used) for the duration of the COVID-19 declaration under Section 564(b)(1) of the Act, 21 U.S.C. section 360bbb-3(b)(1), unless the authorization is terminated or revoked sooner.  Performed at [facility], [address] [place], [place] [address]          Radiology Studies: No results found.      Scheduled Meds: . allopurinol  200 mg Oral Daily  . apixaban  2.5 mg Oral BID  . atorvastatin  20 mg Oral Daily  . DULoxetine  60 mg Oral Daily  . ferrous sulfate  325 mg Oral BID WC  . gabapentin  100 mg Oral QHS  . insulin aspart  0-5 Units Subcutaneous QHS  . insulin aspart  0-9 Units Subcutaneous TID WC  .  insulin glargine  14 Units Subcutaneous Daily  . levETIRAcetam  500 mg Oral BID  . melatonin  3 mg Oral QHS  . mometasone-formoterol  2 puff Inhalation BID  . montelukast  10 mg Oral QHS  . pantoprazole  40 mg Oral Daily  . QUEtiapine  12.5 mg Oral QHS   Continuous Infusions: . sodium chloride 100 mL/hr at 06/16/20 0747     LOS: 9 days    Time spent: 34 minutes spent on chart review, discussion with nursing staff, consultants, updating family and interview/physical exam; more than 50% of that time was spent in counseling and/or coordination of care.    Alvira Philips [place], DO [facility] Available via Epic secure chat 7am-7pm After these hours, please refer to coverage provider listed on [email or web address] 06/16/2020, 10:51 AM

## 2020-06-17 DIAGNOSIS — M6281 Muscle weakness (generalized): Secondary | ICD-10-CM | POA: Diagnosis not present

## 2020-06-17 DIAGNOSIS — R29818 Other symptoms and signs involving the nervous system: Secondary | ICD-10-CM | POA: Diagnosis not present

## 2020-06-17 DIAGNOSIS — N390 Urinary tract infection, site not specified: Secondary | ICD-10-CM | POA: Diagnosis not present

## 2020-06-17 DIAGNOSIS — G4730 Sleep apnea, unspecified: Secondary | ICD-10-CM | POA: Diagnosis not present

## 2020-06-17 DIAGNOSIS — M329 Systemic lupus erythematosus, unspecified: Secondary | ICD-10-CM | POA: Diagnosis not present

## 2020-06-17 DIAGNOSIS — Z6841 Body Mass Index (BMI) 40.0 and over, adult: Secondary | ICD-10-CM | POA: Diagnosis not present

## 2020-06-17 DIAGNOSIS — J189 Pneumonia, unspecified organism: Secondary | ICD-10-CM | POA: Diagnosis present

## 2020-06-17 DIAGNOSIS — N1832 Chronic kidney disease, stage 3b: Secondary | ICD-10-CM | POA: Diagnosis not present

## 2020-06-17 DIAGNOSIS — T426X5A Adverse effect of other antiepileptic and sedative-hypnotic drugs, initial encounter: Secondary | ICD-10-CM | POA: Diagnosis not present

## 2020-06-17 DIAGNOSIS — I69354 Hemiplegia and hemiparesis following cerebral infarction affecting left non-dominant side: Secondary | ICD-10-CM | POA: Diagnosis not present

## 2020-06-17 DIAGNOSIS — R1312 Dysphagia, oropharyngeal phase: Secondary | ICD-10-CM | POA: Diagnosis not present

## 2020-06-17 DIAGNOSIS — F0390 Unspecified dementia without behavioral disturbance: Secondary | ICD-10-CM | POA: Diagnosis not present

## 2020-06-17 DIAGNOSIS — R278 Other lack of coordination: Secondary | ICD-10-CM | POA: Diagnosis not present

## 2020-06-17 DIAGNOSIS — G9340 Encephalopathy, unspecified: Secondary | ICD-10-CM | POA: Diagnosis not present

## 2020-06-17 DIAGNOSIS — R296 Repeated falls: Secondary | ICD-10-CM | POA: Diagnosis not present

## 2020-06-17 DIAGNOSIS — R404 Transient alteration of awareness: Secondary | ICD-10-CM | POA: Diagnosis not present

## 2020-06-17 DIAGNOSIS — Z7401 Bed confinement status: Secondary | ICD-10-CM | POA: Diagnosis not present

## 2020-06-17 DIAGNOSIS — J9611 Chronic respiratory failure with hypoxia: Secondary | ICD-10-CM | POA: Diagnosis not present

## 2020-06-17 DIAGNOSIS — R4182 Altered mental status, unspecified: Secondary | ICD-10-CM | POA: Diagnosis present

## 2020-06-17 DIAGNOSIS — E1165 Type 2 diabetes mellitus with hyperglycemia: Secondary | ICD-10-CM | POA: Diagnosis not present

## 2020-06-17 DIAGNOSIS — R627 Adult failure to thrive: Secondary | ICD-10-CM | POA: Diagnosis not present

## 2020-06-17 DIAGNOSIS — R5381 Other malaise: Secondary | ICD-10-CM | POA: Diagnosis not present

## 2020-06-17 DIAGNOSIS — R488 Other symbolic dysfunctions: Secondary | ICD-10-CM | POA: Diagnosis not present

## 2020-06-17 DIAGNOSIS — J440 Chronic obstructive pulmonary disease with acute lower respiratory infection: Secondary | ICD-10-CM | POA: Diagnosis not present

## 2020-06-17 DIAGNOSIS — I639 Cerebral infarction, unspecified: Secondary | ICD-10-CM | POA: Diagnosis not present

## 2020-06-17 DIAGNOSIS — I509 Heart failure, unspecified: Secondary | ICD-10-CM | POA: Diagnosis not present

## 2020-06-17 DIAGNOSIS — N183 Chronic kidney disease, stage 3 unspecified: Secondary | ICD-10-CM | POA: Diagnosis not present

## 2020-06-17 DIAGNOSIS — Z794 Long term (current) use of insulin: Secondary | ICD-10-CM | POA: Diagnosis not present

## 2020-06-17 DIAGNOSIS — I10 Essential (primary) hypertension: Secondary | ICD-10-CM | POA: Diagnosis not present

## 2020-06-17 DIAGNOSIS — J9601 Acute respiratory failure with hypoxia: Secondary | ICD-10-CM | POA: Diagnosis not present

## 2020-06-17 DIAGNOSIS — E785 Hyperlipidemia, unspecified: Secondary | ICD-10-CM | POA: Diagnosis not present

## 2020-06-17 DIAGNOSIS — E7220 Disorder of urea cycle metabolism, unspecified: Secondary | ICD-10-CM | POA: Diagnosis not present

## 2020-06-17 DIAGNOSIS — E039 Hypothyroidism, unspecified: Secondary | ICD-10-CM | POA: Diagnosis not present

## 2020-06-17 DIAGNOSIS — J45909 Unspecified asthma, uncomplicated: Secondary | ICD-10-CM | POA: Diagnosis not present

## 2020-06-17 DIAGNOSIS — M2550 Pain in unspecified joint: Secondary | ICD-10-CM | POA: Diagnosis not present

## 2020-06-17 DIAGNOSIS — E119 Type 2 diabetes mellitus without complications: Secondary | ICD-10-CM | POA: Diagnosis not present

## 2020-06-17 DIAGNOSIS — I5033 Acute on chronic diastolic (congestive) heart failure: Secondary | ICD-10-CM | POA: Diagnosis not present

## 2020-06-17 DIAGNOSIS — J9621 Acute and chronic respiratory failure with hypoxia: Secondary | ICD-10-CM | POA: Diagnosis not present

## 2020-06-17 DIAGNOSIS — G459 Transient cerebral ischemic attack, unspecified: Secondary | ICD-10-CM | POA: Diagnosis not present

## 2020-06-17 DIAGNOSIS — M797 Fibromyalgia: Secondary | ICD-10-CM | POA: Diagnosis not present

## 2020-06-17 DIAGNOSIS — I480 Paroxysmal atrial fibrillation: Secondary | ICD-10-CM | POA: Diagnosis not present

## 2020-06-17 DIAGNOSIS — Z743 Need for continuous supervision: Secondary | ICD-10-CM | POA: Diagnosis not present

## 2020-06-17 DIAGNOSIS — G928 Other toxic encephalopathy: Secondary | ICD-10-CM | POA: Diagnosis not present

## 2020-06-17 DIAGNOSIS — R2689 Other abnormalities of gait and mobility: Secondary | ICD-10-CM | POA: Diagnosis not present

## 2020-06-17 DIAGNOSIS — Z880 Allergy status to penicillin: Secondary | ICD-10-CM | POA: Diagnosis not present

## 2020-06-17 DIAGNOSIS — Z8616 Personal history of COVID-19: Secondary | ICD-10-CM | POA: Diagnosis not present

## 2020-06-17 DIAGNOSIS — D573 Sickle-cell trait: Secondary | ICD-10-CM | POA: Diagnosis present

## 2020-06-17 DIAGNOSIS — Z66 Do not resuscitate: Secondary | ICD-10-CM | POA: Diagnosis not present

## 2020-06-17 DIAGNOSIS — R918 Other nonspecific abnormal finding of lung field: Secondary | ICD-10-CM | POA: Diagnosis not present

## 2020-06-17 DIAGNOSIS — M189 Osteoarthritis of first carpometacarpal joint, unspecified: Secondary | ICD-10-CM | POA: Diagnosis not present

## 2020-06-17 DIAGNOSIS — R319 Hematuria, unspecified: Secondary | ICD-10-CM | POA: Diagnosis not present

## 2020-06-17 DIAGNOSIS — Z955 Presence of coronary angioplasty implant and graft: Secondary | ICD-10-CM | POA: Diagnosis not present

## 2020-06-17 DIAGNOSIS — E870 Hyperosmolality and hypernatremia: Secondary | ICD-10-CM | POA: Diagnosis not present

## 2020-06-17 DIAGNOSIS — I130 Hypertensive heart and chronic kidney disease with heart failure and stage 1 through stage 4 chronic kidney disease, or unspecified chronic kidney disease: Secondary | ICD-10-CM | POA: Diagnosis not present

## 2020-06-17 DIAGNOSIS — I2510 Atherosclerotic heart disease of native coronary artery without angina pectoris: Secondary | ICD-10-CM | POA: Diagnosis not present

## 2020-06-17 DIAGNOSIS — R4020 Unspecified coma: Secondary | ICD-10-CM | POA: Diagnosis not present

## 2020-06-17 DIAGNOSIS — I4891 Unspecified atrial fibrillation: Secondary | ICD-10-CM | POA: Diagnosis not present

## 2020-06-17 DIAGNOSIS — G9341 Metabolic encephalopathy: Secondary | ICD-10-CM | POA: Diagnosis not present

## 2020-06-17 DIAGNOSIS — J449 Chronic obstructive pulmonary disease, unspecified: Secondary | ICD-10-CM | POA: Diagnosis not present

## 2020-06-17 DIAGNOSIS — N179 Acute kidney failure, unspecified: Secondary | ICD-10-CM | POA: Diagnosis not present

## 2020-06-17 DIAGNOSIS — E1122 Type 2 diabetes mellitus with diabetic chronic kidney disease: Secondary | ICD-10-CM | POA: Diagnosis not present

## 2020-06-17 DIAGNOSIS — R569 Unspecified convulsions: Secondary | ICD-10-CM | POA: Diagnosis not present

## 2020-06-17 LAB — BASIC METABOLIC PANEL
Anion gap: 9 (ref 5–15)
BUN: 28 mg/dL — ABNORMAL HIGH (ref 8–23)
CO2: 20 mmol/L — ABNORMAL LOW (ref 22–32)
Calcium: 9.3 mg/dL (ref 8.9–10.3)
Chloride: 116 mmol/L — ABNORMAL HIGH (ref 98–111)
Creatinine, Ser: 1.64 mg/dL — ABNORMAL HIGH (ref 0.44–1.00)
GFR, Estimated: 31 mL/min — ABNORMAL LOW (ref >60)
Glucose, Bld: 80 mg/dL (ref 70–99)
Potassium: 3.6 mmol/L (ref 3.5–5.1)
Sodium: 145 mmol/L (ref 135–145)

## 2020-06-17 LAB — GLUCOSE, CAPILLARY: Glucose-Capillary: 74 mg/dL (ref 70–99)

## 2020-06-17 NOTE — Plan of Care (Signed)
  Problem: Activity: Goal: Risk for activity intolerance will decrease Outcome: Progressing   Problem: Nutrition: Goal: Adequate nutrition will be maintained Outcome: Progressing   

## 2020-06-17 NOTE — Plan of Care (Signed)
  Problem: Education: Goal: Knowledge of General Education information will improve Description: Including pain rating scale, medication(s)/side effects and non-pharmacologic comfort measures Outcome: Adequate for Discharge   Problem: Health Behavior/Discharge Planning: Goal: Ability to manage health-related needs will improve Outcome: Adequate for Discharge   Problem: Clinical Measurements: Goal: Ability to maintain clinical measurements within normal limits will improve Outcome: Adequate for Discharge Goal: Will remain free from infection Outcome: Adequate for Discharge Goal: Diagnostic test results will improve Outcome: Adequate for Discharge Goal: Respiratory complications will improve Outcome: Adequate for Discharge Goal: Cardiovascular complication will be avoided Outcome: Adequate for Discharge   Problem: Activity: Goal: Risk for activity intolerance will decrease 06/17/2020 1053 by [name], RN Outcome: Adequate for Discharge 06/17/2020 0741 by [name], RN Outcome: Progressing   Problem: Nutrition: Goal: Adequate nutrition will be maintained 06/17/2020 1053 by [name], RN Outcome: Adequate for Discharge 06/17/2020 0741 by [name], RN Outcome: Progressing   Problem: Coping: Goal: Level of anxiety will decrease Outcome: Adequate for Discharge   Problem: Elimination: Goal: Will not experience complications related to bowel motility Outcome: Adequate for Discharge Goal: Will not experience complications related to urinary retention Outcome: Adequate for Discharge   Problem: Pain Managment: Goal: General experience of comfort will improve Outcome: Adequate for Discharge   Problem: Safety: Goal: Ability to remain free from injury will improve Outcome: Adequate for Discharge   Problem: Skin Integrity: Goal: Risk for impaired skin integrity will decrease Outcome: Adequate for Discharge

## 2020-06-17 NOTE — TOC Transition Note (Addendum)
Transition of Care [facility]) - CM/SW Discharge Note *Discharged to [facility]   Patient Details  Name: MATILDA FLEIG MRN: [id number] Date of Birth: 11/13/1940  Transition of Care [facility]) CM/SW Contact:  Sable Feil, LCSW Phone Number: 06/17/2020, 10:39 AM   Clinical Narrative:  Patient discharging this morning to [place] (see 12/13 CSW discharge note).  Discharge summary completed this morning by MD and sent to SNF. Daughter [name] called (10:33 am) and informed that [facility] was in her mother's room.     Final next level of care: [place] ([facility]) Barriers to Discharge: Barriers Resolved   Patient Goals and CMS Choice Patient states their goals for this hospitalization and ongoing recovery are:: Daughter and patient agreeable to [place] rehab CMS Medicare.gov Compare Post Acute Care list provided to:: Patient Represenative (must comment) (Daughters provided with Medicare.gov SNF list) Choice offered to / list presented to : Adult Children  Discharge Placement   Existing PASRR number confirmed : 06/09/20          Patient chooses bed at: Other - please specify in the comment section below: ([facility]) Patient to be transferred to facility by: Non-emergency ambulance transport Name of family member notified: [name] - daughter. Talked with her at the bedside Patient and family notified of of transfer: 06/16/20  Discharge Plan and Services In-house Referral: Clinical Social Work   Post Acute Care Choice: [place]                              Social Determinants of Health (SDOH) Interventions  No SDOH interventions requested or needed at discharge   Readmission Risk Interventions No flowsheet data found.

## 2020-06-17 NOTE — Telephone Encounter (Signed)
This has been completed she is scheduled to see CC on 12/29 @ 10 .

## 2020-06-17 NOTE — Care Management Important Message (Signed)
Important Message  Patient Details  Name: Brittany Roberts MRN: [id number] Date of Birth: December 22, 1940   Medicare Important Message Given:  Yes     Barb Merino Rhame 06/17/2020, 12:48 PM

## 2020-06-17 NOTE — Progress Notes (Signed)
DISCHARGE NOTE SNF [facility] to be discharged Skilled nursing facility per MD order. Patient verbalized understanding.  Skin clean, dry and intact without evidence of skin break down, no evidence of skin tears noted. IV catheter discontinued intact. Site without signs and symptoms of complications. Dressing and pressure applied. Pt denies pain at the site currently. No complaints noted.  Patient free of lines, drains, and wounds.   Discharge packet assembled. An After Visit Summary (AVS) was printed and given to the EMS personnel. Patient escorted via stretcher and discharged to [facility] via ambulance. Report called to accepting facility; all questions and concerns addressed.   [name], RN

## 2020-06-20 DIAGNOSIS — N390 Urinary tract infection, site not specified: Secondary | ICD-10-CM | POA: Diagnosis not present

## 2020-06-20 DIAGNOSIS — G9341 Metabolic encephalopathy: Secondary | ICD-10-CM | POA: Diagnosis not present

## 2020-06-20 DIAGNOSIS — I4891 Unspecified atrial fibrillation: Secondary | ICD-10-CM | POA: Diagnosis not present

## 2020-06-20 DIAGNOSIS — R319 Hematuria, unspecified: Secondary | ICD-10-CM | POA: Diagnosis not present

## 2020-06-22 ENCOUNTER — Emergency Department (HOSPITAL_COMMUNITY): Payer: Medicare Other

## 2020-06-22 ENCOUNTER — Inpatient Hospital Stay (HOSPITAL_COMMUNITY)
Admission: EM | Admit: 2020-06-22 | Discharge: 2020-06-27 | DRG: 091 | Disposition: A | Discharge status: Skilled Nursing Facility | Payer: Medicare Other | Source: Skilled Nursing Facility | Attending: Internal Medicine | Admitting: Internal Medicine

## 2020-06-22 ENCOUNTER — Other Ambulatory Visit: Payer: Self-pay

## 2020-06-22 ENCOUNTER — Encounter (HOSPITAL_COMMUNITY): Payer: Self-pay | Admitting: Emergency Medicine

## 2020-06-22 DIAGNOSIS — J9621 Acute and chronic respiratory failure with hypoxia: Secondary | ICD-10-CM | POA: Diagnosis not present

## 2020-06-22 DIAGNOSIS — Z9049 Acquired absence of other specified parts of digestive tract: Secondary | ICD-10-CM

## 2020-06-22 DIAGNOSIS — K219 Gastro-esophageal reflux disease without esophagitis: Secondary | ICD-10-CM | POA: Diagnosis present

## 2020-06-22 DIAGNOSIS — I639 Cerebral infarction, unspecified: Secondary | ICD-10-CM | POA: Diagnosis not present

## 2020-06-22 DIAGNOSIS — E7220 Disorder of urea cycle metabolism, unspecified: Secondary | ICD-10-CM | POA: Diagnosis present

## 2020-06-22 DIAGNOSIS — E785 Hyperlipidemia, unspecified: Secondary | ICD-10-CM | POA: Diagnosis not present

## 2020-06-22 DIAGNOSIS — I10 Essential (primary) hypertension: Secondary | ICD-10-CM | POA: Diagnosis not present

## 2020-06-22 DIAGNOSIS — N1832 Chronic kidney disease, stage 3b: Secondary | ICD-10-CM | POA: Diagnosis not present

## 2020-06-22 DIAGNOSIS — I509 Heart failure, unspecified: Secondary | ICD-10-CM

## 2020-06-22 DIAGNOSIS — R4020 Unspecified coma: Secondary | ICD-10-CM | POA: Diagnosis not present

## 2020-06-22 DIAGNOSIS — E1165 Type 2 diabetes mellitus with hyperglycemia: Secondary | ICD-10-CM | POA: Diagnosis not present

## 2020-06-22 DIAGNOSIS — D573 Sickle-cell trait: Secondary | ICD-10-CM | POA: Diagnosis present

## 2020-06-22 DIAGNOSIS — I5033 Acute on chronic diastolic (congestive) heart failure: Secondary | ICD-10-CM | POA: Diagnosis not present

## 2020-06-22 DIAGNOSIS — Z87891 Personal history of nicotine dependence: Secondary | ICD-10-CM

## 2020-06-22 DIAGNOSIS — G928 Other toxic encephalopathy: Secondary | ICD-10-CM | POA: Diagnosis not present

## 2020-06-22 DIAGNOSIS — T426X5A Adverse effect of other antiepileptic and sedative-hypnotic drugs, initial encounter: Secondary | ICD-10-CM | POA: Diagnosis present

## 2020-06-22 DIAGNOSIS — M329 Systemic lupus erythematosus, unspecified: Secondary | ICD-10-CM | POA: Diagnosis present

## 2020-06-22 DIAGNOSIS — Z794 Long term (current) use of insulin: Secondary | ICD-10-CM | POA: Diagnosis not present

## 2020-06-22 DIAGNOSIS — F0390 Unspecified dementia without behavioral disturbance: Secondary | ICD-10-CM | POA: Diagnosis present

## 2020-06-22 DIAGNOSIS — N183 Chronic kidney disease, stage 3 unspecified: Secondary | ICD-10-CM | POA: Diagnosis present

## 2020-06-22 DIAGNOSIS — R4182 Altered mental status, unspecified: Secondary | ICD-10-CM

## 2020-06-22 DIAGNOSIS — Z743 Need for continuous supervision: Secondary | ICD-10-CM | POA: Diagnosis not present

## 2020-06-22 DIAGNOSIS — J449 Chronic obstructive pulmonary disease, unspecified: Secondary | ICD-10-CM | POA: Diagnosis present

## 2020-06-22 DIAGNOSIS — T502X5A Adverse effect of carbonic-anhydrase inhibitors, benzothiadiazides and other diuretics, initial encounter: Secondary | ICD-10-CM | POA: Diagnosis present

## 2020-06-22 DIAGNOSIS — I130 Hypertensive heart and chronic kidney disease with heart failure and stage 1 through stage 4 chronic kidney disease, or unspecified chronic kidney disease: Secondary | ICD-10-CM | POA: Diagnosis present

## 2020-06-22 DIAGNOSIS — I4891 Unspecified atrial fibrillation: Secondary | ICD-10-CM | POA: Diagnosis not present

## 2020-06-22 DIAGNOSIS — E049 Nontoxic goiter, unspecified: Secondary | ICD-10-CM

## 2020-06-22 DIAGNOSIS — R404 Transient alteration of awareness: Secondary | ICD-10-CM | POA: Diagnosis not present

## 2020-06-22 DIAGNOSIS — Z66 Do not resuscitate: Secondary | ICD-10-CM | POA: Diagnosis present

## 2020-06-22 DIAGNOSIS — G9340 Encephalopathy, unspecified: Secondary | ICD-10-CM | POA: Diagnosis present

## 2020-06-22 DIAGNOSIS — N179 Acute kidney failure, unspecified: Secondary | ICD-10-CM | POA: Diagnosis present

## 2020-06-22 DIAGNOSIS — E876 Hypokalemia: Secondary | ICD-10-CM | POA: Diagnosis not present

## 2020-06-22 DIAGNOSIS — J440 Chronic obstructive pulmonary disease with acute lower respiratory infection: Secondary | ICD-10-CM | POA: Diagnosis not present

## 2020-06-22 DIAGNOSIS — E870 Hyperosmolality and hypernatremia: Secondary | ICD-10-CM | POA: Diagnosis not present

## 2020-06-22 DIAGNOSIS — Z880 Allergy status to penicillin: Secondary | ICD-10-CM

## 2020-06-22 DIAGNOSIS — I69354 Hemiplegia and hemiparesis following cerebral infarction affecting left non-dominant side: Secondary | ICD-10-CM | POA: Diagnosis not present

## 2020-06-22 DIAGNOSIS — Z955 Presence of coronary angioplasty implant and graft: Secondary | ICD-10-CM

## 2020-06-22 DIAGNOSIS — E039 Hypothyroidism, unspecified: Secondary | ICD-10-CM | POA: Diagnosis present

## 2020-06-22 DIAGNOSIS — R29818 Other symptoms and signs involving the nervous system: Secondary | ICD-10-CM | POA: Diagnosis not present

## 2020-06-22 DIAGNOSIS — R918 Other nonspecific abnormal finding of lung field: Secondary | ICD-10-CM | POA: Diagnosis not present

## 2020-06-22 DIAGNOSIS — J189 Pneumonia, unspecified organism: Secondary | ICD-10-CM | POA: Diagnosis not present

## 2020-06-22 DIAGNOSIS — Z9981 Dependence on supplemental oxygen: Secondary | ICD-10-CM

## 2020-06-22 DIAGNOSIS — I480 Paroxysmal atrial fibrillation: Secondary | ICD-10-CM | POA: Diagnosis not present

## 2020-06-22 DIAGNOSIS — Z7951 Long term (current) use of inhaled steroids: Secondary | ICD-10-CM

## 2020-06-22 DIAGNOSIS — I2510 Atherosclerotic heart disease of native coronary artery without angina pectoris: Secondary | ICD-10-CM | POA: Diagnosis present

## 2020-06-22 DIAGNOSIS — K5730 Diverticulosis of large intestine without perforation or abscess without bleeding: Secondary | ICD-10-CM | POA: Diagnosis not present

## 2020-06-22 DIAGNOSIS — E1149 Type 2 diabetes mellitus with other diabetic neurological complication: Secondary | ICD-10-CM | POA: Diagnosis present

## 2020-06-22 DIAGNOSIS — G4733 Obstructive sleep apnea (adult) (pediatric): Secondary | ICD-10-CM | POA: Diagnosis present

## 2020-06-22 DIAGNOSIS — E1142 Type 2 diabetes mellitus with diabetic polyneuropathy: Secondary | ICD-10-CM | POA: Diagnosis present

## 2020-06-22 DIAGNOSIS — M797 Fibromyalgia: Secondary | ICD-10-CM | POA: Diagnosis present

## 2020-06-22 DIAGNOSIS — R627 Adult failure to thrive: Secondary | ICD-10-CM | POA: Diagnosis present

## 2020-06-22 DIAGNOSIS — G40909 Epilepsy, unspecified, not intractable, without status epilepticus: Secondary | ICD-10-CM | POA: Diagnosis present

## 2020-06-22 DIAGNOSIS — E1122 Type 2 diabetes mellitus with diabetic chronic kidney disease: Secondary | ICD-10-CM | POA: Diagnosis present

## 2020-06-22 DIAGNOSIS — M109 Gout, unspecified: Secondary | ICD-10-CM | POA: Diagnosis present

## 2020-06-22 DIAGNOSIS — G459 Transient cerebral ischemic attack, unspecified: Secondary | ICD-10-CM | POA: Diagnosis not present

## 2020-06-22 DIAGNOSIS — Z7901 Long term (current) use of anticoagulants: Secondary | ICD-10-CM

## 2020-06-22 DIAGNOSIS — Z8616 Personal history of COVID-19: Secondary | ICD-10-CM

## 2020-06-22 DIAGNOSIS — Z6841 Body Mass Index (BMI) 40.0 and over, adult: Secondary | ICD-10-CM

## 2020-06-22 DIAGNOSIS — R7989 Other specified abnormal findings of blood chemistry: Secondary | ICD-10-CM

## 2020-06-22 DIAGNOSIS — Z79899 Other long term (current) drug therapy: Secondary | ICD-10-CM

## 2020-06-22 LAB — CBC WITH DIFFERENTIAL/PLATELET
Abs Immature Granulocytes: 0.08 10*3/uL — ABNORMAL HIGH (ref 0.00–0.07)
Basophils Absolute: 0.1 10*3/uL (ref 0.0–0.1)
Basophils Relative: 1 %
Eosinophils Absolute: 0.1 10*3/uL (ref 0.0–0.5)
Eosinophils Relative: 1 %
HCT: 29 % — ABNORMAL LOW (ref 36.0–46.0)
Hemoglobin: 9.8 g/dL — ABNORMAL LOW (ref 12.0–15.0)
Immature Granulocytes: 1 %
Lymphocytes Relative: 19 %
Lymphs Abs: 2.4 10*3/uL (ref 0.7–4.0)
MCH: 26 pg (ref 26.0–34.0)
MCHC: 33.8 g/dL (ref 30.0–36.0)
MCV: 76.9 fL — ABNORMAL LOW (ref 80.0–100.0)
Monocytes Absolute: 1.5 10*3/uL — ABNORMAL HIGH (ref 0.1–1.0)
Monocytes Relative: 11 %
Neutro Abs: 8.8 10*3/uL — ABNORMAL HIGH (ref 1.7–7.7)
Neutrophils Relative %: 67 %
Platelets: 184 10*3/uL (ref 150–400)
RBC: 3.77 MIL/uL — ABNORMAL LOW (ref 3.87–5.11)
RDW: 16.6 % — ABNORMAL HIGH (ref 11.5–15.5)
WBC: 12.9 10*3/uL — ABNORMAL HIGH (ref 4.0–10.5)
nRBC: 1.7 % — ABNORMAL HIGH (ref 0.0–0.2)

## 2020-06-22 LAB — TSH: TSH: 0.84 u[IU]/mL (ref 0.350–4.500)

## 2020-06-22 LAB — APTT: aPTT: 34 seconds (ref 24–36)

## 2020-06-22 LAB — I-STAT CHEM 8, ED
BUN: 16 mg/dL (ref 8–23)
Calcium, Ion: 1.33 mmol/L (ref 1.15–1.40)
Chloride: 114 mmol/L — ABNORMAL HIGH (ref 98–111)
Creatinine, Ser: 1.4 mg/dL — ABNORMAL HIGH (ref 0.44–1.00)
Glucose, Bld: 220 mg/dL — ABNORMAL HIGH (ref 70–99)
HCT: 33 % — ABNORMAL LOW (ref 36.0–46.0)
Hemoglobin: 11.2 g/dL — ABNORMAL LOW (ref 12.0–15.0)
Potassium: 4 mmol/L (ref 3.5–5.1)
Sodium: 147 mmol/L — ABNORMAL HIGH (ref 135–145)
TCO2: 20 mmol/L — ABNORMAL LOW (ref 22–32)

## 2020-06-22 LAB — CBC
HCT: 31.9 % — ABNORMAL LOW (ref 36.0–46.0)
Hemoglobin: 10.5 g/dL — ABNORMAL LOW (ref 12.0–15.0)
MCH: 25.7 pg — ABNORMAL LOW (ref 26.0–34.0)
MCHC: 32.9 g/dL (ref 30.0–36.0)
MCV: 78 fL — ABNORMAL LOW (ref 80.0–100.0)
Platelets: 202 10*3/uL (ref 150–400)
RBC: 4.09 MIL/uL (ref 3.87–5.11)
RDW: 16.7 % — ABNORMAL HIGH (ref 11.5–15.5)
WBC: 13 10*3/uL — ABNORMAL HIGH (ref 4.0–10.5)
nRBC: 1.8 % — ABNORMAL HIGH (ref 0.0–0.2)

## 2020-06-22 LAB — COMPREHENSIVE METABOLIC PANEL WITH GFR
ALT: 46 U/L — ABNORMAL HIGH (ref 0–44)
AST: 56 U/L — ABNORMAL HIGH (ref 15–41)
Albumin: 3.1 g/dL — ABNORMAL LOW (ref 3.5–5.0)
Alkaline Phosphatase: 94 U/L (ref 38–126)
Anion gap: 10 (ref 5–15)
BUN: 15 mg/dL (ref 8–23)
CO2: 20 mmol/L — ABNORMAL LOW (ref 22–32)
Calcium: 9.8 mg/dL (ref 8.9–10.3)
Chloride: 112 mmol/L — ABNORMAL HIGH (ref 98–111)
Creatinine, Ser: 1.64 mg/dL — ABNORMAL HIGH (ref 0.44–1.00)
GFR, Estimated: 31 mL/min — ABNORMAL LOW
Glucose, Bld: 262 mg/dL — ABNORMAL HIGH (ref 70–99)
Potassium: 4 mmol/L (ref 3.5–5.1)
Sodium: 142 mmol/L (ref 135–145)
Total Bilirubin: 1.2 mg/dL (ref 0.3–1.2)
Total Protein: 7.2 g/dL (ref 6.5–8.1)

## 2020-06-22 LAB — I-STAT ARTERIAL BLOOD GAS, ED
Acid-base deficit: 4 mmol/L — ABNORMAL HIGH (ref 0.0–2.0)
Bicarbonate: 20.8 mmol/L (ref 20.0–28.0)
Calcium, Ion: 1.37 mmol/L (ref 1.15–1.40)
HCT: 32 % — ABNORMAL LOW (ref 36.0–46.0)
Hemoglobin: 10.9 g/dL — ABNORMAL LOW (ref 12.0–15.0)
O2 Saturation: 97 %
Patient temperature: 98
Potassium: 3.7 mmol/L (ref 3.5–5.1)
Sodium: 147 mmol/L — ABNORMAL HIGH (ref 135–145)
TCO2: 22 mmol/L (ref 22–32)
pCO2 arterial: 36.7 mmHg (ref 32.0–48.0)
pH, Arterial: 7.36 (ref 7.350–7.450)
pO2, Arterial: 96 mmHg (ref 83.0–108.0)

## 2020-06-22 LAB — CBG MONITORING, ED: Glucose-Capillary: 205 mg/dL — ABNORMAL HIGH (ref 70–99)

## 2020-06-22 LAB — PROTIME-INR
INR: 1.5 — ABNORMAL HIGH (ref 0.8–1.2)
Prothrombin Time: 17.3 s — ABNORMAL HIGH (ref 11.4–15.2)

## 2020-06-22 LAB — ETHANOL: Alcohol, Ethyl (B): <10 mg/dL (ref <10)

## 2020-06-22 LAB — AMMONIA: Ammonia: 71 umol/L — ABNORMAL HIGH (ref 9–35)

## 2020-06-22 MED ORDER — DILTIAZEM HCL 25 MG/5ML IV SOLN
10.0000 mg | Freq: Once | INTRAVENOUS | Status: AC
  Administered 2020-06-22: 23:00:00 10 mg via INTRAVENOUS
  Filled 2020-06-22: qty 5

## 2020-06-22 MED ORDER — LEVETIRACETAM IN NACL 500 MG/100ML IV SOLN
500.0000 mg | Freq: Two times a day (BID) | INTRAVENOUS | Status: DC
  Administered 2020-06-23: 10:00:00 500 mg via INTRAVENOUS
  Filled 2020-06-22 (×2): qty 100

## 2020-06-22 MED ORDER — LEVETIRACETAM IN NACL 1000 MG/100ML IV SOLN
1000.0000 mg | Freq: Once | INTRAVENOUS | Status: AC
  Administered 2020-06-23: 01:00:00 1000 mg via INTRAVENOUS
  Filled 2020-06-22: qty 100

## 2020-06-22 MED ORDER — ZIPRASIDONE MESYLATE 20 MG IM SOLR
20.0000 mg | Freq: Once | INTRAMUSCULAR | Status: AC
  Administered 2020-06-22: 22:00:00 20 mg via INTRAMUSCULAR
  Filled 2020-06-22: qty 20

## 2020-06-22 NOTE — ED Provider Notes (Signed)
[facility] DEPARTMENT Provider Note   CSN: [id number] Arrival date & time: 06/22/20  1821     History Chief Complaint  Patient presents with  . Altered Mental Status  . Agitation    Brittany Roberts is a 79 y.o. female.  Level 5 caveat for altered mental status.  Patient unable to give any history.  Daughter at bedside provides history.  She has been altered and confused and agitated since approximately 2 PM today.  She was at rehab after recent admission for altered mental status and urinary tract infection.  Daughter reports she was normally conversant and following commands up until about 2 PM today.  She appears like she is in pain and she is uncomfortable and writhing around the bed not following commands and not speaking.  Daughter is concerned he could be constipated as she has only had a couple hard bowel movement since she was discharged.  Daughter is uncertain if she is in pain.  There has been no report of fever, chills, nausea or vomiting.  No chest pain or shortness of breath.  Patient shakes her head no when she is asked if she is in pain. She has had black stools but this is not a new problem for her and she has no apparent supplementation take Eliquis. Also had some component of delirium with dementia while she was hospitalized for this is an acute change in her mental status per the daughter at bedside.  The history is provided by the patient and a relative. The history is limited by the condition of the patient.  Altered Mental Status      Past Medical History:  Diagnosis Date  . Acute delirium 01/14/2017  . Anxiety   . Arthritis    "all over"  . Asthma   . Bipolar disorder ([place])   . Complication of anesthesia    "w/right foot OR they gave me too much and they couldn't get me woke"  . Congestive heart failure, unspecified   . Coronary artery calcification seen on CAT scan 01/14/2017  . Coronary artery disease    "I've got 1 stent"  (06/30/2015)  . Depression   . Discoid lupus   . Fibromyalgia    "I've been told I have this; can't take Lyrica cause I'm allergic to it" (06/30/2015)  . GERD (gastroesophageal reflux disease)   . History of gout   . History of hiatal hernia    "real bad" (06/30/2015)  . Hypertension   . Hypothyroidism   . Memory loss   . On home oxygen therapy    "2L q hs" (06/30/2015)  . OSA (obstructive sleep apnea)    "just use my oxygen; no mask" (06/30/2015)  . Other and unspecified hyperlipidemia   . Penetrating foot wound    left nonhealing foot wound on the dorsal surface  . Peripheral neuropathy   . Pneumonia "many of times"  . Refusal of blood transfusions as patient is Jehovah's Witness   . Renal failure, unspecified    "kidneys not working 100%" (06/30/2015)  . Seizure ([place])   . Sickle cell trait ([place])   . Systemic lupus ([place])   . Thoracic aortic atherosclerosis ([place]) 01/14/2017  . Type II diabetes mellitus ([place])   . Unspecified vitamin D deficiency     Patient Active Problem List   Diagnosis Date Noted  . Atrial fibrillation with RVR ([place]) 06/10/2020  . AMS (altered mental status) 06/07/2020  . Hypomagnesemia 02/20/2020  . Chronic  respiratory failure with hypoxia, on home O2 therapy ([place]) 02/20/2020  . Monitoring for long-term anticoagulant use 12/17/2019  . LV (left ventricular) mural thrombus 12/11/2019  . Acute on chronic diastolic CHF (congestive heart failure) ([place]) 12/06/2019  . Pneumonia due to COVID-19 virus 11/01/2019  . Respiratory failure ([place]) 11/01/2019  . Memory loss 04/05/2019  . Cerebrovascular accident (CVA) ([place]) 04/05/2019  . Seizures ([place]) 04/05/2019  . Obstructive sleep apnea 04/05/2019  . Chronic heart failure with preserved ejection fraction ([place]) 01/17/2019  . OP (osteoporosis) 01/01/2019  . Abdominal wall pain in right flank 08/13/2018  . Alopecia, scarring 08/11/2018  . Chronic right shoulder pain 03/15/2018  . Bilateral primary osteoarthritis  of knee 02/09/2018  . Pain in right ankle and joints of right foot 02/09/2018  . Acute exacerbation of congestive heart failure ([place]) 12/13/2017  . Chronic kidney disease (CKD), stage IV (severe) ([place]) 09/01/2017  . Gout, arthropathy 09/01/2017  . Impacted cerumen of both ears 05/13/2017  . Sensorineural hearing loss, bilateral 05/13/2017  . Localization-related symptomatic epilepsy and epileptic syndromes with complex partial seizures, not intractable, without status epilepticus ([place]) 04/25/2017  . Fibromyalgia 04/08/2017  . Allergic rhinitis 03/11/2017  . Cardiomyopathy ([place]) 01/22/2017  . Hyperlipidemia associated with type 2 diabetes mellitus ([place]) 01/22/2017  . Bipolar disorder ([place]) 01/22/2017  . Seizure ([place])   . Thoracic aortic atherosclerosis ([place]) 01/14/2017  . Coronary artery calcification seen on CAT scan 01/14/2017  . Acute delirium 01/14/2017  . Possible Tonic-clonic seizure disorder ([place]) 01/14/2017  . CVA (cerebral vascular accident) ([place]) 01/14/2017  . Altered mental status 01/13/2017  . Mastalgia 04/20/2016  . Bilateral leg edema 02/10/2016  . Difficulty hearing 10/30/2015  . Midline low back pain with right-sided sciatica 09/25/2015  . Carpal tunnel syndrome on left 08/14/2015  . Chest pain of uncertain etiology 03/83/3383  . Primary localized osteoarthrosis, hand 06/19/2015  . Radial styloid tenosynovitis 06/19/2015  . Dysarthria 02/14/2015  . Type 2 diabetes mellitus with complication ([place])   . Cephalalgia   . Hx of systemic lupus erythematosus (SLE) ([place])   . Thyroid mass 04/26/2014  . Constipation 12/10/2013  . Benign neoplasm of colon 12/10/2013  . Type 2 diabetes mellitus with diabetic neuropathy, unspecified ([place]) 11/27/2013  . Renal cyst, right 11/09/2013  . Insulin dependent type 2 diabetes mellitus ([place]) 10/29/2013  . Hoarseness or changing voice 09/27/2013  . Dyskinesia, tardive 07/11/2013  . TMJ syndrome 02/16/2013  . Angina pectoris associated  with type 2 diabetes mellitus ([place]) 08/08/2012  . Coronary artery disease of native artery of native heart with stable angina pectoris ([place]) 08/08/2012  . Asthma, chronic, PRESUMED wiht MULTIFACTORIAL DYSPNEA 05/30/2012  . Insomnia 09/22/2011  . Mobility poor 02/02/2011  . Chronic kidney disease, stage III (moderate) ([place]) 05/13/2010  . Chronic systolic (congestive) heart failure ([place]) 03/31/2010  . COPD (chronic obstructive pulmonary disease) ([place]) 03/26/2010  . Morbid obesity ([place]) 02/27/2010  . Unstable gait 02/27/2010  . DM (diabetes mellitus), type 2 with neurological complications ([place]) 29/19/1660  . Arthritis 04/25/2009  . Anemia 03/11/2007  . COGNITIVE IMPAIRMENT, MILD, SO STATED 03/11/2007  . RESTLESS LEG SYNDROME 03/11/2007  . Lupus ([place]) 03/11/2007  . OSTEOARTHROSIS, GENERALIZED, MULTIPLE SITES 03/11/2007  . Hypothyroidism 03/07/2007  . Hypercholesteremia 03/07/2007  . Depression 03/07/2007  . Hypertensive heart disease with heart failure ([place]) 03/07/2007  . Coronary atherosclerosis 03/07/2007  . GERD 03/07/2007  . SLEEP APNEA 03/07/2007    Past Surgical History:  Procedure Laterality Date  . ANKLE ARTHROSCOPY WITH  OPEN REDUCTION INTERNAL FIXATION (ORIF) Right 03/2011  . CARDIAC CATHETERIZATION N/A 07/01/2015   Procedure: Left Heart Cath and Coronary Angiography;  Surgeon: Adrian Prows, MD;  Location: [facility];  Service: Cardiovascular;  Laterality: N/A;  . CARDIAC CATHETERIZATION N/A 07/01/2015   Procedure: Intravascular Pressure Wire/FFR Study;  Surgeon: Adrian Prows, MD;  Location: [facility];  Service: Cardiovascular;  Laterality: N/A;  . CARPAL TUNNEL RELEASE Bilateral   . CATARACT EXTRACTION W/ INTRAOCULAR LENS  IMPLANT, BILATERAL Bilateral   . CHOLECYSTECTOMY OPEN    . COLONOSCOPY N/A 12/10/2013   Procedure: COLONOSCOPY;  Surgeon: Inda Castle, MD;  Location: [facility];  Service: Endoscopy;  Laterality: N/A;  . FRACTURE SURGERY    . INCISION AND  DRAINAGE ABSCESS Left    foot "under my little toe"  . KNEE LIGAMENT RECONSTRUCTION Right   . LEFT HEART CATHETERIZATION WITH CORONARY ANGIOGRAM N/A 08/08/2012   Procedure: LEFT HEART CATHETERIZATION WITH CORONARY ANGIOGRAM;  Surgeon: [name], MD;  Location[facility] C[facility] LAB;  Service: Cardiovascular;  Laterality: N/A;  . NASAL SINUS SURGERY    . PERCUTANEOUS CORONARY STENT INTERVENTION (PCI-S) N/A 08/21/2012   Procedure: PERCUTANEOUS CORONARY STENT INTERVENTION (PCI-S);  Surgeon: [name], MD;  Location: [facility] CATH LAB;  Service: Cardiovascular;  Laterality: N/A;  . TUBAL LIGATION       OB History   No obstetric history on file.     Family History  Problem Relation Age of Onset  . Heart disease Mother   . Diabetes Mother   . Clotting disorder Mother   . Pneumonia Father   . Rheum arthritis Father   . Diabetes Sister   . Diabetes Sister   . Asthma Brother   . Cancer Brother   . Kidney disease Brother   . Lupus Son   . Heart disease Son   . Heart disease Daughter   . Colon cancer Neg Hx     Social History   Tobacco Use  . Smoking status: Former Smoker    Packs/day: 3.00    Years: 40.00    Pack years: 120.00    Types: Cigarettes    Start date: 07/05/1960    Quit date: 07/05/1998    Years since quitting: 21.9  . Smokeless tobacco: Never Used  Vaping Use  . Vaping Use: Never used  Substance Use Topics  . Alcohol use: No    Alcohol/week: 0.0 standard drinks    Comment: havent drank in over 40 years  . Drug use: No    Home Medications Prior to Admission medications   Medication Sig Start Date End Date Taking? Authorizing Provider  Accu-Chek Softclix Lancets lancets Use to test blood sugars 1-2 times daily.Dx:e11.9 05/12/20   [name], [name], MD  acetaminophen (TYLENOL) 500 MG tablet Take 500 mg by mouth every 6 (six) hours as needed for moderate pain.    [provider]  allopurinol (ZYLOPRIM) 100 MG tablet Take 2 tablets (200 mg total) by mouth  daily. 02/18/20   [name], [name], MD  apixaban (ELIQUIS) 2.5 MG TABS tablet Take 1 tablet (2.5 mg total) by mouth 2 (two) times daily. 06/13/20   [place] Donnamarie Poag, DO  Ascorbic Acid (VITAMIN C) 500 MG CAPS Take 1 capsule by mouth daily. 02/26/20   [name], [name], MD  atorvastatin (LIPITOR) 20 MG tablet TAKE 1 TABLET(20 MG) BY MOUTH DAILY Patient taking differently: Take 20 mg by mouth daily.  06/03/20   [name], [name],  MD  B Complex Vitamins (VITAMIN B COMPLEX) TABS Take 1 tablet by mouth daily.    [provider]  Blood Glucose Monitoring Suppl (ACCU-CHEK GUIDE) w/Device KIT Use to test blood sugars 1-2 times daily. Dx:e11.9 05/14/20   [name], [name], MD  Cholecalciferol (VITAMIN D3) 5000 UNITS CAPS Take 5,000 Units by mouth daily.    [provider]  DULoxetine (CYMBALTA) 60 MG capsule Take 1 capsule (60 mg total) by mouth daily. 10/20/19   [name], [name], MD  ferrous sulfate 325 (65 FE) MG tablet Take 325 mg by mouth 2 (two) times daily with a meal.     [provider]  Fluocinolone Acetonide Scalp 0.01 % OIL Apply 1 application topically at bedtime as needed (itching scalp).  07/30/19   [provider]  gabapentin (NEURONTIN) 100 MG capsule Take 1 capsule (100 mg total) by mouth at bedtime. 06/13/20   [place] Eric J, DO  glucose blood (ACCU-CHEK GUIDE) test strip Use to test blood sugars 1-2 times daily. Dx: e11.9 05/14/20   [name], [name], MD  insulin glargine (LANTUS) 100 UNIT/ML injection Inject 0.14 mLs (14 Units total) into the skin daily. 06/15/20   [place] Eric J, DO  Insulin Pen Needle (B-D ULTRAFINE III SHORT PEN) 31G X 8 MM MISC CHECK BLOOD SUGAR 3 TIMES A DAY 03/15/19   [name], [name], MD  isosorbide mononitrate (IMDUR) 30 MG 24 hr tablet TAKE 1 TABLET(30 MG) BY MOUTH DAILY Patient taking differently: Take 30 mg by mouth daily.  02/04/20   Patwardhan, Reynold Bowen, MD  levETIRAcetam (KEPPRA) 500 MG tablet Take 1 tablet (500 mg total) by mouth 2 (two)  times daily. 07/30/19   Suzzanne Cloud, NP  melatonin 3 MG TABS tablet Take 1 tablet (3 mg total) by mouth at bedtime. 06/16/20   [place] Eric J, DO  memantine (NAMENDA) 10 MG tablet Take 10 mg by mouth daily.    [provider]  metoprolol succinate (TOPROL-XL) 50 MG 24 hr tablet TAKE 1 TABLET BY MOUTH EVERY DAY. TAKE WITH OR IMMEDIATELY FOLLOWING A MEAL Patient taking differently: Take 50 mg by mouth daily.  12/17/19   [name], [name], MD  montelukast (SINGULAIR) 10 MG tablet TAKE 1 TABLET(10 MG) BY MOUTH AT BEDTIME Patient taking differently: Take 10 mg by mouth at bedtime.  03/05/20   [name], [name], MD  Omega-3 Fatty Acids (FISH OIL) 1000 MG CAPS Take 1,000 mg by mouth daily.     [provider]  pantoprazole (PROTONIX) 40 MG tablet TAKE 1 TABLET(40 MG) BY MOUTH DAILY Patient taking differently: Take 40 mg by mouth daily.  08/28/19   [name], [name], MD  Polyethyl Glycol-Propyl Glycol (SYSTANE) 0.4-0.3 % GEL ophthalmic gel Place 1 application into both eyes every 4 (four) hours as needed (dry eyes).    [provider]  PROAIR HFA 108 (90 Base) MCG/ACT inhaler INHALE 2 PUFF BY MOUTH EVERY 4 HOURS AS NEEDED USE ONLY IF YOU ARE WHEEZING Patient taking differently: Inhale 2 puffs into the lungs every 4 (four) hours as needed for wheezing.  10/17/19   [name], [name], MD  Spacer/Aero-Holding Chambers (AEROCHAMBER PLUS) inhaler Use as instructed to use with inahaler. 07/17/19   [name], [name], MD  SYMBICORT 80-4.5 MCG/ACT inhaler INHALE 2 PUFFS INTO THE LUNGS TWICE DAILY Patient taking differently: Inhale 2 puffs into the lungs in the morning and at bedtime.  03/31/20   [name], [name], MD    Allergies  Ace inhibitors, Isosorb dinitrate-hydralazine, Penicillins, Buspirone, Pregabalin, Ropinirole hydrochloride, and Amantadines  Review of Systems   Review of Systems  Unable to perform ROS: Mental status change    Physical Exam Updated Vital Signs BP (!) 138/100 (BP  Location: Left Wrist)   Pulse (!) 104   Temp 98 F (36.7 C) (Oral)   Resp (!) 24   SpO2 100%   Physical Exam Vitals and nursing note reviewed.  Constitutional:      General: She is not in acute distress.    Appearance: She is well-developed and well-nourished. She is obese. She is ill-appearing.     Comments: Confused, appears uncomfortable, grunting respiration obtunded  HENT:     Head: Normocephalic and atraumatic.     Left Ear: Ear canal normal.     Mouth/Throat:     Mouth: Oropharynx is clear and moist.     Pharynx: No oropharyngeal exudate.  Eyes:     Extraocular Movements: EOM normal.     Conjunctiva/sclera: Conjunctivae normal.     Pupils: Pupils are equal, round, and reactive to light.  Neck:     Comments: No meningismus. Cardiovascular:     Rate and Rhythm: Tachycardia present. Rhythm irregular.     Pulses: Intact distal pulses.     Heart sounds: Normal heart sounds. No murmur heard.   Pulmonary:     Effort: Pulmonary effort is normal. No respiratory distress.     Breath sounds: Normal breath sounds.     Comments: Diminished breath sounds bilaterally Abdominal:     Palpations: Abdomen is soft.     Tenderness: There is no abdominal tenderness. There is no guarding or rebound.  Genitourinary:    Comments: Black stool on Hemoccult, no fecal impaction Musculoskeletal:        General: No tenderness or edema. Normal range of motion.     Cervical back: Normal range of motion and neck supple.  Skin:    General: Skin is warm.  Neurological:     Mental Status: She is disoriented.     Motor: No abnormal muscle tone.     Comments: Opens eyes to voice, does not follow commands, moves all extremities spontaneously. Shakes head no when asked if she is in pain Minimal speech  Psychiatric:        Mood and Affect: Mood and affect normal.     ED Results / Procedures / Treatments   Labs (all labs ordered are listed, but only abnormal results are displayed) Labs  Reviewed  COMPREHENSIVE METABOLIC PANEL - Abnormal; Notable for the following components:      Result Value   Chloride 112 (*)    CO2 20 (*)    Glucose, Bld 262 (*)    Creatinine, Ser 1.64 (*)    Albumin 3.1 (*)    AST 56 (*)    ALT 46 (*)    GFR, Estimated 31 (*)    All other components within normal limits  CBC - Abnormal; Notable for the following components:   WBC 13.0 (*)    Hemoglobin 10.5 (*)    HCT 31.9 (*)    MCV 78.0 (*)    MCH 25.7 (*)    RDW 16.7 (*)    nRBC 1.8 (*)    All other components within normal limits  PROTIME-INR - Abnormal; Notable for the following components:   Prothrombin Time 17.3 (*)    INR 1.5 (*)    All other components within normal limits  CBC WITH DIFFERENTIAL/PLATELET -  Abnormal; Notable for the following components:   WBC 12.9 (*)    RBC 3.77 (*)    Hemoglobin 9.8 (*)    HCT 29.0 (*)    MCV 76.9 (*)    RDW 16.6 (*)    nRBC 1.7 (*)    Neutro Abs 8.8 (*)    Monocytes Absolute 1.5 (*)    Abs Immature Granulocytes 0.08 (*)    All other components within normal limits  CBG MONITORING, ED - Abnormal; Notable for the following components:   Glucose-Capillary 205 (*)    All other components within normal limits  I-STAT CHEM 8, ED - Abnormal; Notable for the following components:   Sodium 147 (*)    Chloride 114 (*)    Creatinine, Ser 1.40 (*)    Glucose, Bld 220 (*)    TCO2 20 (*)    Hemoglobin 11.2 (*)    HCT 33.0 (*)    All other components within normal limits  URINE CULTURE  APTT  ETHANOL  RAPID URINE DRUG SCREEN, HOSP PERFORMED  URINALYSIS, ROUTINE W REFLEX MICROSCOPIC  AMMONIA  TSH  I-STAT ARTERIAL BLOOD GAS, ED    EKG EKG Interpretation  Date/Time:  Sunday June 22 2020 18:28:11 EST Ventricular Rate:  108 PR Interval:  92 QRS Duration: 14 QT Interval:  276 QTC Calculation: 369 R Axis:   0 Text Interpretation: Undetermined rhythm Indeterminate axis Pulmonary disease pattern Nonspecific ST and T wave abnormality  Abnormal ECG Artifact needs repeat Confirmed by Ezequiel Essex [phone]) on 06/22/2020 9:05:26 PM   Radiology CT HEAD CODE STROKE WO CONTRAST  Result Date: 06/22/2020 CLINICAL DATA:  Code stroke. Initial evaluation for neuro deficit, stroke suspected. EXAM: CT HEAD WITHOUT CONTRAST TECHNIQUE: Contiguous axial images were obtained from the base of the skull through the vertex without intravenous contrast. COMPARISON:  Prior MRI from 06/12/2020 FINDINGS: Brain: Examination mildly degraded by motion artifact. Age-related cerebral atrophy with chronic microvascular ischemic disease. Chronic right MCA distribution infarct involving the anterior right frontal lobe. No acute intracranial hemorrhage. No acute large vessel territory infarct. No mass lesion, midline shift or mass effect. No hydrocephalus or extra-axial fluid collection. Vascular: No hyperdense vessel. Scattered vascular calcifications noted within the carotid siphons. Skull: Scalp soft tissues and calvarium within normal limits. Sinuses/Orbits: Right gaze noted. Prior bilateral ocular lens replacement. Chronic right maxillary sinusitis. Mastoid air cells are clear. Other: None. ASPECTS [facility] Stroke Program Early CT Score) - Ganglionic level infarction (caudate, lentiform nuclei, internal capsule, insula, M1-M3 cortex): 7 - Supraganglionic infarction (M4-M6 cortex): 3 Total score (0-10 with 10 being normal): 10 IMPRESSION: 1. No acute intracranial infarct or other abnormality. 2. ASPECTS is 10. 3. Chronic right MCA distribution infarct. 4. Underlying atrophy with mild chronic small vessel ischemic disease. 5. Chronic right maxillary sinusitis. These results were communicated to Dr. Cheral Marker at 8:27 [place] 12/19/2021by text page via the [facility] messaging system. Electronically Signed   By: Jeannine Boga M.D.   On: 06/22/2020 20:28    Procedures Procedures (including critical care time)  Medications Ordered in ED Medications - No data to  display  ED Course  I have reviewed the triage vital signs and the nursing notes.  Pertinent labs & imaging results that were available during my care of the patient were reviewed by me and considered in my medical decision making (see chart for details).    MDM Rules/Calculators/A&P  Altered mental status, last seen normal approximately 2 PM.  Acute change in mental status with decreased speech output and not following commands.  Recent admission for UTI.  Patient definitely altered but not necessarily aphasic.  She answers a few questions and grunts but does not appear to have any respiratory distress.  Lungs are clear.  She is not following commands.  Her daughter reports her last normal was 2 PM.  Code stroke was activated on arrival.  CT head is negative for bleed.  She was seen by Dr. Cheral Marker of neurology.  He has obtained additional history from the daughter who states that patient has had increased weakness over the past 2 days so her last normal was greater than 48 hours ago. She is not a candidate for TPA due to her Eliquis use.  She is not a candidate for endovascular intervention due to her delayed presentation after more than 24 hours..  Dr. Cheral Marker recommends an altered mental status work-up but does not feel she is having an acute stroke.  Patient appears uncomfortable and restless and altered.  She denies any pain.  Toxicology labs will be obtained. UA, CXR.  Abdomen is soft.  Unclear etiology of her presentation. Given her degree of apparent discomfort will proceed with CT imaging of her abdomen.   Care transferred to [facility] at shift change.   ED ECG REPORT   Date: 06/22/2020  Rate: 154  Rhythm: atrial fibrillation  QRS Axis: normal  Intervals: normal  ST/T Wave abnormalities: nonspecific ST/T changes  Conduction Disutrbances:nonspecific intraventricular conduction delay  Narrative Interpretation:   Old EKG Reviewed: changes  noted  I have personally reviewed the EKG tracing and agree with the computerized printout as noted.  Final Clinical Impression(s) / ED Diagnoses Final diagnoses:  None    Rx / DC Orders ED Discharge Orders    None       Krisy Dix, Annie Main, MD 06/22/20 2209

## 2020-06-22 NOTE — ED Notes (Signed)
Patient transported to CT 

## 2020-06-22 NOTE — ED Triage Notes (Signed)
Pt to triage via [facility] from [facility].  Staff reports pt is agitated with AMS.  Pt alert.  Shakes her head no when asked if she has pain.

## 2020-06-22 NOTE — ED Provider Notes (Addendum)
Care assumed at shift change from Dr. Verna Czech, see his note for full HPI and work-up. Briefly, patient presenting with altered mental status after recent admission for altered mental status thought to be multifactorial attributed to UTI and exacerbated by dementia.  She had interval improvement in mental status, however today became more altered, confused and agitated.  CT head is negative.  Presentation seems less consistent with stroke per neurology.  Altered mental status work-up is initiated.  Noted to have leukocytosis, however this appears to be chronic.  She is afebrile, initially tachycardic in A. Fib, which was new during recent admission, cardizem ordered here for rate control.  Ammonia also appears to be elevated at 71.  Patient developed wheezing and a cough productive of clear phlegm. CXR with developing pulmonary edema and consolidation in the bases . Per chart review, last ECHo with EF 55-60% earlier this month.   Pending remainder of imaging ordered, UA, care assumed at shift change by Dr. Betsey Holiday.   Mirza Fessel, [name] N, PA-C 06/22/20 [place], [name] N, PA-C 06/22/20 [id number]    Ezequiel Essex, MD 06/23/20 [phone]

## 2020-06-22 NOTE — Consult Note (Addendum)
NEURO HOSPITALIST CONSULT NOTE   Requestig physician: Dr. Wyvonnia Dusky  Reason for Consult: Acute onset of left sided weakness and AMS  History obtained from:  Daughter and Chart     HPI:                                                                                                                                          Brittany Roberts is an 79 y.o. female with a PMHx of atrial fibrillation (on Eliquis), acute delirium, arthritis, bipolar disorder, asthma, on home oxygen, CHF, CAD s/p stenting, discoid lupus, gout, HTN, hiatal hernia, HTN, hypothyroidism, dementia, OSA, sickle cell trait, DM2, hyperlipidemia, morbid obesity, hypothyroidism, peripheral neuropathy, prior seizure x 1 (currently on an anticonvulsant), presenting from her Rehab facility via EMS as a Code Stroke. LKN was prior to Friday, when daughter visited the patient at her Rehab facility and noted her to be weak on her left side. Today, when visiting at 1:45 PM, the daughter noted that the patient was confused and poorly communicative. There was an issue with trying to help her have a BM with an enema and being moved to a commode, but the patient continued to exhibit AMS and minimal speech output, which consisted of short answers and exclamations rather than full sentences - at times she made grunting noises rather than formed words. The daughter became more worried and hence EMS was called. Daughter states that there were some tremulous movements noted at the facility, but that they did not appear to be due to a seizure, and that the patient could give short answers to questions during the tremulousness.   Of note, the patient had been admitted to [facility] starting about 2 weeks ago for a UTI. She stayed for about one week and was discharged to the Rehab facility at that time. Just prior to discharge, the patient's daughter states that she was conversant and able to walk with a walker without additional assistance.   She has  dementia and at baseline can perform some ADLs on her own, but requires others to prepare her meals for her and manage medications.   Of note, she is a Sales promotion account executive witness and has refused blood products in the past.   Per daughter, she was diagnosed as having had a prior stroke sometime around 2013 by review of serial CT scans when she was admitted for a first time seizure and her CT had revealed a chronic left frontal lobe infarction. She is on Keppra 500 mg BID for seizure prophylaxis.   She takes Eliquis 2.5 mg BID.   LKN: Prior to family visiting her at [place] on Friday tPA administered: No, out of time window.   Past Medical History:  Diagnosis Date  . Acute delirium 01/14/2017  . Anxiety   .  Arthritis    "all over"  . Asthma   . Bipolar disorder ([place])   . Complication of anesthesia    "w/right foot OR they gave me too much and they couldn't get me woke"  . Congestive heart failure, unspecified   . Coronary artery calcification seen on CAT scan 01/14/2017  . Coronary artery disease    "I've got 1 stent" (06/30/2015)  . Depression   . Discoid lupus   . Fibromyalgia    "I've been told I have this; can't take Lyrica cause I'm allergic to it" (06/30/2015)  . GERD (gastroesophageal reflux disease)   . History of gout   . History of hiatal hernia    "real bad" (06/30/2015)  . Hypertension   . Hypothyroidism   . Memory loss   . On home oxygen therapy    "2L q hs" (06/30/2015)  . OSA (obstructive sleep apnea)    "just use my oxygen; no mask" (06/30/2015)  . Other and unspecified hyperlipidemia   . Penetrating foot wound    left nonhealing foot wound on the dorsal surface  . Peripheral neuropathy   . Pneumonia "many of times"  . Refusal of blood transfusions as patient is Jehovah's Witness   . Renal failure, unspecified    "kidneys not working 100%" (06/30/2015)  . Seizure ([place])   . Sickle cell trait ([place])   . Systemic lupus ([place])   . Thoracic aortic atherosclerosis ([place])  01/14/2017  . Type II diabetes mellitus ([place])   . Unspecified vitamin D deficiency     Past Surgical History:  Procedure Laterality Date  . ANKLE ARTHROSCOPY WITH OPEN REDUCTION INTERNAL FIXATION (ORIF) Right 03/2011  . CARDIAC CATHETERIZATION N/A 07/01/2015   Procedure: Left Heart Cath and Coronary Angiography;  Surgeon: Adrian Prows, MD;  Location: [place] [facility];  Service: Cardiovascular;  Laterality: N/A;  . CARDIAC CATHETERIZATION N/A 07/01/2015   Procedure: Intravascular Pressure Wire/FFR Study;  Surgeon: Adrian Prows, MD;  Location: [place] [facility];  Service: Cardiovascular;  Laterality: N/A;  . CARPAL TUNNEL RELEASE Bilateral   . CATARACT EXTRACTION W/ INTRAOCULAR LENS  IMPLANT, BILATERAL Bilateral   . CHOLECYSTECTOMY OPEN    . COLONOSCOPY N/A 12/10/2013   Procedure: COLONOSCOPY;  Surgeon: Inda Castle, MD;  Location: [facility];  Service: Endoscopy;  Laterality: N/A;  . FRACTURE SURGERY    . INCISION AND DRAINAGE ABSCESS Left    foot "under my little toe"  . KNEE LIGAMENT RECONSTRUCTION Right   . LEFT HEART CATHETERIZATION WITH CORONARY ANGIOGRAM N/A 08/08/2012   Procedure: LEFT HEART CATHETERIZATION WITH CORONARY ANGIOGRAM;  Surgeon: [name], MD;  Location: [facility] CATH LAB;  Service: Cardiovascular;  Laterality: N/A;  . NASAL SINUS SURGERY    . PERCUTANEOUS CORONARY STENT INTERVENTION (PCI-S) N/A 08/21/2012   Procedure: PERCUTANEOUS CORONARY STENT INTERVENTION (PCI-S);  Surgeon: [name], MD;  Location: [facility] CATH LAB;  Service: Cardiovascular;  Laterality: N/A;  . TUBAL LIGATION      Family History  Problem Relation Age of Onset  . Heart disease Mother   . Diabetes Mother   . Clotting disorder Mother   . Pneumonia Father   . Rheum arthritis Father   . Diabetes Sister   . Diabetes Sister   . Asthma Brother   . Cancer Brother   . Kidney disease Brother   . Lupus Son   . Heart disease Son   . Heart disease Daughter   . Colon cancer Neg Hx  Social History:  reports that she quit smoking about 21 years ago. Her smoking use included cigarettes. She started smoking about 60 years ago. She has a 120.00 pack-year smoking history. She has never used smokeless tobacco. She reports that she does not drink alcohol and does not use drugs.  Allergies  Allergen Reactions  . Ace Inhibitors Other (See Comments)    Coincided with sig bump in creat. Retried and creat bumped again.   . Isosorb Dinitrate-Hydralazine Other (See Comments)    Sleep all the time.   Marland Kitchen Penicillins Anaphylaxis, Swelling and Rash    Has patient had a PCN reaction causing immediate rash, facial/tongue/throat swelling, SOB or lightheadedness with hypotension: Yes Has patient had a PCN reaction causing severe rash involving mucus membranes or skin necrosis: Yes Has patient had a PCN reaction that required hospitalization: Yes Has patient had a PCN reaction occurring within the last 10 years: No If all of the above answers are "NO", then may proceed with Cephalosporin use.   Marland Kitchen Buspirone Other (See Comments)    pain   . Pregabalin Swelling  . Ropinirole Hydrochloride Swelling  . Amantadines Rash    "Swelling of the tongue"    HOME MEDICATIONS:                                                                                                                      No current facility-administered medications on file prior to encounter.   Current Outpatient Medications on File Prior to Encounter  Medication Sig Dispense Refill  . Accu-Chek Softclix Lancets lancets Use to test blood sugars 1-2 times daily.Dx:e11.9 100 each 12  . acetaminophen (TYLENOL) 500 MG tablet Take 500 mg by mouth every 6 (six) hours as needed for moderate pain.    Marland Kitchen allopurinol (ZYLOPRIM) 100 MG tablet Take 2 tablets (200 mg total) by mouth daily. 180 tablet 1  . apixaban (ELIQUIS) 2.5 MG TABS tablet Take 1 tablet (2.5 mg total) by mouth 2 (two) times daily. 60 tablet   . Ascorbic Acid (VITAMIN  C) 500 MG CAPS Take 1 capsule by mouth daily. 30 capsule 0  . atorvastatin (LIPITOR) 20 MG tablet TAKE 1 TABLET(20 MG) BY MOUTH DAILY (Patient taking differently: Take 20 mg by mouth daily. ) 90 tablet 0  . B Complex Vitamins (VITAMIN B COMPLEX) TABS Take 1 tablet by mouth daily.    . Blood Glucose Monitoring Suppl (ACCU-CHEK GUIDE) w/Device KIT Use to test blood sugars 1-2 times daily. Dx:e11.9 1 kit 1  . Cholecalciferol (VITAMIN D3) 5000 UNITS CAPS Take 5,000 Units by mouth daily.    . DULoxetine (CYMBALTA) 60 MG capsule Take 1 capsule (60 mg total) by mouth daily. 90 capsule 2  . ferrous sulfate 325 (65 FE) MG tablet Take 325 mg by mouth 2 (two) times daily with a meal.     . Fluocinolone Acetonide Scalp 0.01 % OIL Apply 1 application topically at bedtime as needed (itching scalp).     . gabapentin (  NEURONTIN) 100 MG capsule Take 1 capsule (100 mg total) by mouth at bedtime.    Marland Kitchen glucose blood (ACCU-CHEK GUIDE) test strip Use to test blood sugars 1-2 times daily. Dx: e11.9 100 each 12  . insulin glargine (LANTUS) 100 UNIT/ML injection Inject 0.14 mLs (14 Units total) into the skin daily. 10 mL 11  . Insulin Pen Needle (B-D ULTRAFINE III SHORT PEN) 31G X 8 MM MISC CHECK BLOOD SUGAR 3 TIMES A DAY 100 each 3  . isosorbide mononitrate (IMDUR) 30 MG 24 hr tablet TAKE 1 TABLET(30 MG) BY MOUTH DAILY (Patient taking differently: Take 30 mg by mouth daily. ) 90 tablet 3  . levETIRAcetam (KEPPRA) 500 MG tablet Take 1 tablet (500 mg total) by mouth 2 (two) times daily. 180 tablet 3  . melatonin 3 MG TABS tablet Take 1 tablet (3 mg total) by mouth at bedtime.  0  . memantine (NAMENDA) 10 MG tablet Take 10 mg by mouth daily.    . metoprolol succinate (TOPROL-XL) 50 MG 24 hr tablet TAKE 1 TABLET BY MOUTH EVERY DAY. TAKE WITH OR IMMEDIATELY FOLLOWING A MEAL (Patient taking differently: Take 50 mg by mouth daily. ) 90 tablet 1  . montelukast (SINGULAIR) 10 MG tablet TAKE 1 TABLET(10 MG) BY MOUTH AT BEDTIME  (Patient taking differently: Take 10 mg by mouth at bedtime. ) 90 tablet 3  . Omega-3 Fatty Acids (FISH OIL) 1000 MG CAPS Take 1,000 mg by mouth daily.     . pantoprazole (PROTONIX) 40 MG tablet TAKE 1 TABLET(40 MG) BY MOUTH DAILY (Patient taking differently: Take 40 mg by mouth daily. ) 90 tablet 3  . Polyethyl Glycol-Propyl Glycol (SYSTANE) 0.4-0.3 % GEL ophthalmic gel Place 1 application into both eyes every 4 (four) hours as needed (dry eyes).    Marland Kitchen PROAIR HFA 108 (90 Base) MCG/ACT inhaler INHALE 2 PUFF BY MOUTH EVERY 4 HOURS AS NEEDED USE ONLY IF YOU ARE WHEEZING (Patient taking differently: Inhale 2 puffs into the lungs every 4 (four) hours as needed for wheezing. ) 8.5 g 3  . Spacer/Aero-Holding Chambers (AEROCHAMBER PLUS) inhaler Use as instructed to use with inahaler. 1 each 1  . SYMBICORT 80-4.5 MCG/ACT inhaler INHALE 2 PUFFS INTO THE LUNGS TWICE DAILY (Patient taking differently: Inhale 2 puffs into the lungs in the morning and at bedtime. ) 10.2 g 3     ROS:                                                                                                                                       Unable to obtain due to AMS.    Blood pressure 117/79, pulse 84, temperature 98 F (36.7 C), temperature source Oral, resp. rate (!) 31, SpO2 95 %.   General Examination:  Physical Exam  General- Morbidly obese HEENT-  [place]/AT. No neck stiffness. Non-diaphoretic. Forehead temp is subjectively normal.  Lungs- Tachypneic with increased WOB Extremities- No cyanosis or pallor  Neurological Examination Mental Status: In an eyes-open poorly responsive state with decreased level of consciousness and decreased responsiveness to external stimuli. Will give yes/no answers to several questions in the context of her tachypnea, but does not speak with full sentences or even short phrases. Does not answer any  orientation questions. Does not follow commands, except for squeezing of examiner's hand and visual tracking. Will exclaim to noxious stimuli.  Cranial Nerves: II: Does not blink to threat. PERRL 2 mm.   III,IV, VI: Has a right sided gaze preference but will cross the midline to the left when asked repeatedly to look in that direction. Eyes are conjugate. No nystagmus.   V,VII: Subtle left facial droop. Reacts to brow ridge pressure equally bilaterally.  VIII: Hearing intact to some commands IX,X: Unable to assess.  XI: Does not follow commands for assessment, but has a tendency to rotate her head to the right.  XII: Does not protrude tongue to command Motor/Sensory: Does not follow motor commands. Strength is assessed with noxious stimuli and passive elevation/release by examiner: RUE: Remains elevated > 10 seconds when passively elevated and released. Fidgets with RUE purposefully. Will move to noxious.  RLE with brisk withdrawal to noxious. LUE slowly drifts to bed after passive elevation and release. Will move to noxiouus but less briskly than on the right . LLE withdraws to noxious, but less briskly than on the right.  Deep Tendon Reflexes:  1+ bilateral bradhioradialis. Difficulty to elicit patellars in the context of significant adiposity.  Plantars: Equivocal bilaterally  Cerebellar:/Gait: Unable to assess   Lab Results: Basic Metabolic Panel: Recent Labs  Lab 06/16/20 0325 06/17/20 0206 06/22/20 1913 06/22/20 2035 06/22/20 2111  NA 144 145 142 147* 147*  K 3.7 3.6 4.0 4.0 3.7  CL 115* 116* 112* 114*  --   CO2 19* 20* 20*  --   --   GLUCOSE 78 80 262* 220*  --   BUN 32* 28* 15 16  --   CREATININE 1.80* 1.64* 1.64* 1.40*  --   CALCIUM 9.5 9.3 9.8  --   --     CBC: Recent Labs  Lab 06/22/20 1913 06/22/20 2029 06/22/20 2035 06/22/20 2111  WBC 13.0* 12.9*  --   --   NEUTROABS  --  8.8*  --   --   HGB 10.5* 9.8* 11.2* 10.9*  HCT 31.9* 29.0* 33.0* 32.0*  MCV 78.0*  76.9*  --   --   PLT 202 184  --   --     Cardiac Enzymes: No results for input(s): CKTOTAL, CKMB, CKMBINDEX, TROPONINI in the last 168 hours.  Lipid Panel: No results for input(s): CHOL, TRIG, HDL, CHOLHDL, VLDL, LDLCALC in the last 168 hours.  Imaging: CT HEAD CODE STROKE WO CONTRAST  Result Date: 06/22/2020 CLINICAL DATA:  Code stroke. Initial evaluation for neuro deficit, stroke suspected. EXAM: CT HEAD WITHOUT CONTRAST TECHNIQUE: Contiguous axial images were obtained from the base of the skull through the vertex without intravenous contrast. COMPARISON:  Prior MRI from 06/12/2020 FINDINGS: Brain: Examination mildly degraded by motion artifact. Age-related cerebral atrophy with chronic microvascular ischemic disease. Chronic right MCA distribution infarct involving the anterior right frontal lobe. No acute intracranial hemorrhage. No acute large vessel territory infarct. No mass lesion, midline shift or mass effect. No hydrocephalus or extra-axial  fluid collection. Vascular: No hyperdense vessel. Scattered vascular calcifications noted within the carotid siphons. Skull: Scalp soft tissues and calvarium within normal limits. Sinuses/Orbits: Right gaze noted. Prior bilateral ocular lens replacement. Chronic right maxillary sinusitis. Mastoid air cells are clear. Other: None. ASPECTS [facility] Stroke Program Early CT Score) - Ganglionic level infarction (caudate, lentiform nuclei, internal capsule, insula, M1-M3 cortex): 7 - Supraganglionic infarction (M4-M6 cortex): 3 Total score (0-10 with 10 being normal): 10 IMPRESSION: 1. No acute intracranial infarct or other abnormality. 2. ASPECTS is 10. 3. Chronic right MCA distribution infarct. 4. Underlying atrophy with mild chronic small vessel ischemic disease. 5. Chronic right maxillary sinusitis. These results were communicated to Dr. Cheral Marker at 8:27 [place] 12/19/2021by text page via the [name] messaging system. Electronically Signed   By: Jeannine Boga M.D.   On: 06/22/2020 20:28    Assessment: 79 year old female with a history of atrial fibrillation on Eliquis, seizure x 1, recent UTI with AMS, re-presenting with altered mental status and left sided weakness 1. Exam reveals left sided weakness and severe confusion with agitation in the setting of tachypnea with increased WOB. Follows some commands and will answer some questions with yes/no responses.  2. CT head reveals a medium-sized chronic right MCA distribution infarct. There is diffuse atrophy with mild chronic small vessel ischemic disease. No acute abnormality is seen.  3. Left sided weakness on exam is most likely secondary to unmasking of a subclinical neurological deficit due to the old right MCA infarction seen on CT.  4. Speech and cognitive findings on exam are more consistent with an acute delirium than an acute stroke. DDx includes metabolic and infectious etiologies. No neck stiffness or fever to suggest a meningitis. Stroke is also possible.  5. Unable to obtain IV access for CTA.  6. Out of the tPA and endovascular time windows. Last seen normal sometime prior to Friday, according to daughter.   Recommendations: 1. Workup of her respiratory distress. DDx includes AMS secondary to acute hypercarbia and/or hypoxia. Also will need metabolic and infectious work up for her acute delirium. 2. MRI brain when able 3. EEG in AM (ordered) 4. Frequent neuro checks 5. Continue Eliquis 6. Continue Keppra 500 mg BID after a 1000 mg IV load now (both ordered). Switching scheduled dose to IV formulation.    Electronically signed: Dr. Kerney Elbe 06/22/2020, 10:28 PM

## 2020-06-23 ENCOUNTER — Encounter (HOSPITAL_COMMUNITY): Payer: Self-pay | Admitting: Radiology

## 2020-06-23 ENCOUNTER — Inpatient Hospital Stay (HOSPITAL_COMMUNITY): Payer: Medicare Other

## 2020-06-23 ENCOUNTER — Emergency Department (HOSPITAL_COMMUNITY): Payer: Medicare Other

## 2020-06-23 DIAGNOSIS — I69398 Other sequelae of cerebral infarction: Secondary | ICD-10-CM | POA: Diagnosis not present

## 2020-06-23 DIAGNOSIS — J9621 Acute and chronic respiratory failure with hypoxia: Secondary | ICD-10-CM

## 2020-06-23 DIAGNOSIS — G928 Other toxic encephalopathy: Secondary | ICD-10-CM | POA: Diagnosis not present

## 2020-06-23 DIAGNOSIS — M2550 Pain in unspecified joint: Secondary | ICD-10-CM | POA: Diagnosis not present

## 2020-06-23 DIAGNOSIS — T426X5A Adverse effect of other antiepileptic and sedative-hypnotic drugs, initial encounter: Secondary | ICD-10-CM | POA: Diagnosis present

## 2020-06-23 DIAGNOSIS — I5033 Acute on chronic diastolic (congestive) heart failure: Secondary | ICD-10-CM | POA: Diagnosis not present

## 2020-06-23 DIAGNOSIS — R4182 Altered mental status, unspecified: Secondary | ICD-10-CM | POA: Diagnosis present

## 2020-06-23 DIAGNOSIS — E870 Hyperosmolality and hypernatremia: Secondary | ICD-10-CM | POA: Diagnosis present

## 2020-06-23 DIAGNOSIS — R410 Disorientation, unspecified: Secondary | ICD-10-CM | POA: Diagnosis not present

## 2020-06-23 DIAGNOSIS — R918 Other nonspecific abnormal finding of lung field: Secondary | ICD-10-CM | POA: Diagnosis not present

## 2020-06-23 DIAGNOSIS — I480 Paroxysmal atrial fibrillation: Secondary | ICD-10-CM | POA: Diagnosis present

## 2020-06-23 DIAGNOSIS — I69328 Other speech and language deficits following cerebral infarction: Secondary | ICD-10-CM | POA: Diagnosis not present

## 2020-06-23 DIAGNOSIS — Z794 Long term (current) use of insulin: Secondary | ICD-10-CM | POA: Diagnosis not present

## 2020-06-23 DIAGNOSIS — I129 Hypertensive chronic kidney disease with stage 1 through stage 4 chronic kidney disease, or unspecified chronic kidney disease: Secondary | ICD-10-CM | POA: Diagnosis not present

## 2020-06-23 DIAGNOSIS — Z955 Presence of coronary angioplasty implant and graft: Secondary | ICD-10-CM | POA: Diagnosis not present

## 2020-06-23 DIAGNOSIS — Z7401 Bed confinement status: Secondary | ICD-10-CM | POA: Diagnosis not present

## 2020-06-23 DIAGNOSIS — D573 Sickle-cell trait: Secondary | ICD-10-CM | POA: Diagnosis present

## 2020-06-23 DIAGNOSIS — J449 Chronic obstructive pulmonary disease, unspecified: Secondary | ICD-10-CM | POA: Diagnosis not present

## 2020-06-23 DIAGNOSIS — I2510 Atherosclerotic heart disease of native coronary artery without angina pectoris: Secondary | ICD-10-CM | POA: Diagnosis present

## 2020-06-23 DIAGNOSIS — E1140 Type 2 diabetes mellitus with diabetic neuropathy, unspecified: Secondary | ICD-10-CM | POA: Diagnosis not present

## 2020-06-23 DIAGNOSIS — Z03818 Encounter for observation for suspected exposure to other biological agents ruled out: Secondary | ICD-10-CM | POA: Diagnosis not present

## 2020-06-23 DIAGNOSIS — G9340 Encephalopathy, unspecified: Secondary | ICD-10-CM | POA: Diagnosis not present

## 2020-06-23 DIAGNOSIS — I69828 Other speech and language deficits following other cerebrovascular disease: Secondary | ICD-10-CM | POA: Diagnosis not present

## 2020-06-23 DIAGNOSIS — J189 Pneumonia, unspecified organism: Secondary | ICD-10-CM

## 2020-06-23 DIAGNOSIS — E039 Hypothyroidism, unspecified: Secondary | ICD-10-CM | POA: Diagnosis present

## 2020-06-23 DIAGNOSIS — E7220 Disorder of urea cycle metabolism, unspecified: Secondary | ICD-10-CM | POA: Diagnosis present

## 2020-06-23 DIAGNOSIS — E1122 Type 2 diabetes mellitus with diabetic chronic kidney disease: Secondary | ICD-10-CM | POA: Diagnosis not present

## 2020-06-23 DIAGNOSIS — K7689 Other specified diseases of liver: Secondary | ICD-10-CM | POA: Diagnosis not present

## 2020-06-23 DIAGNOSIS — E1149 Type 2 diabetes mellitus with other diabetic neurological complication: Secondary | ICD-10-CM | POA: Diagnosis not present

## 2020-06-23 DIAGNOSIS — Z8616 Personal history of COVID-19: Secondary | ICD-10-CM | POA: Diagnosis not present

## 2020-06-23 DIAGNOSIS — K5730 Diverticulosis of large intestine without perforation or abscess without bleeding: Secondary | ICD-10-CM | POA: Diagnosis not present

## 2020-06-23 DIAGNOSIS — N1832 Chronic kidney disease, stage 3b: Secondary | ICD-10-CM | POA: Diagnosis not present

## 2020-06-23 DIAGNOSIS — Z66 Do not resuscitate: Secondary | ICD-10-CM | POA: Diagnosis not present

## 2020-06-23 DIAGNOSIS — N179 Acute kidney failure, unspecified: Secondary | ICD-10-CM | POA: Diagnosis not present

## 2020-06-23 DIAGNOSIS — I69354 Hemiplegia and hemiparesis following cerebral infarction affecting left non-dominant side: Secondary | ICD-10-CM | POA: Diagnosis not present

## 2020-06-23 DIAGNOSIS — J440 Chronic obstructive pulmonary disease with acute lower respiratory infection: Secondary | ICD-10-CM | POA: Diagnosis present

## 2020-06-23 DIAGNOSIS — R0902 Hypoxemia: Secondary | ICD-10-CM | POA: Diagnosis not present

## 2020-06-23 DIAGNOSIS — I130 Hypertensive heart and chronic kidney disease with heart failure and stage 1 through stage 4 chronic kidney disease, or unspecified chronic kidney disease: Secondary | ICD-10-CM | POA: Diagnosis present

## 2020-06-23 DIAGNOSIS — I509 Heart failure, unspecified: Secondary | ICD-10-CM | POA: Diagnosis not present

## 2020-06-23 DIAGNOSIS — Z6841 Body Mass Index (BMI) 40.0 and over, adult: Secondary | ICD-10-CM | POA: Diagnosis not present

## 2020-06-23 DIAGNOSIS — M6281 Muscle weakness (generalized): Secondary | ICD-10-CM | POA: Diagnosis not present

## 2020-06-23 DIAGNOSIS — R1312 Dysphagia, oropharyngeal phase: Secondary | ICD-10-CM | POA: Diagnosis not present

## 2020-06-23 DIAGNOSIS — E049 Nontoxic goiter, unspecified: Secondary | ICD-10-CM | POA: Diagnosis not present

## 2020-06-23 DIAGNOSIS — I4891 Unspecified atrial fibrillation: Secondary | ICD-10-CM | POA: Diagnosis not present

## 2020-06-23 DIAGNOSIS — Z880 Allergy status to penicillin: Secondary | ICD-10-CM | POA: Diagnosis not present

## 2020-06-23 DIAGNOSIS — Z743 Need for continuous supervision: Secondary | ICD-10-CM | POA: Diagnosis not present

## 2020-06-23 DIAGNOSIS — E785 Hyperlipidemia, unspecified: Secondary | ICD-10-CM | POA: Diagnosis present

## 2020-06-23 LAB — CBC
HCT: 30.7 % — ABNORMAL LOW (ref 36.0–46.0)
Hemoglobin: 10.2 g/dL — ABNORMAL LOW (ref 12.0–15.0)
MCH: 26.1 pg (ref 26.0–34.0)
MCHC: 33.2 g/dL (ref 30.0–36.0)
MCV: 78.5 fL — ABNORMAL LOW (ref 80.0–100.0)
Platelets: 193 10*3/uL (ref 150–400)
RBC: 3.91 MIL/uL (ref 3.87–5.11)
RDW: 16.7 % — ABNORMAL HIGH (ref 11.5–15.5)
WBC: 11.5 10*3/uL — ABNORMAL HIGH (ref 4.0–10.5)
nRBC: 1.1 % — ABNORMAL HIGH (ref 0.0–0.2)

## 2020-06-23 LAB — RESP PANEL BY RT-PCR (FLU A&B, COVID) ARPGX2
Influenza A by PCR: NEGATIVE
Influenza B by PCR: NEGATIVE
SARS Coronavirus 2 by RT PCR: NEGATIVE

## 2020-06-23 LAB — URINE CULTURE: Culture: NO GROWTH

## 2020-06-23 LAB — RAPID URINE DRUG SCREEN, HOSP PERFORMED
Amphetamines: NOT DETECTED
Barbiturates: NOT DETECTED
Benzodiazepines: NOT DETECTED
Cocaine: NOT DETECTED
Opiates: NOT DETECTED
Tetrahydrocannabinol: NOT DETECTED

## 2020-06-23 LAB — CBG MONITORING, ED
Glucose-Capillary: 136 mg/dL — ABNORMAL HIGH (ref 70–99)
Glucose-Capillary: 144 mg/dL — ABNORMAL HIGH (ref 70–99)
Glucose-Capillary: 149 mg/dL — ABNORMAL HIGH (ref 70–99)
Glucose-Capillary: 156 mg/dL — ABNORMAL HIGH (ref 70–99)
Glucose-Capillary: 165 mg/dL — ABNORMAL HIGH (ref 70–99)

## 2020-06-23 LAB — BASIC METABOLIC PANEL
Anion gap: 10 (ref 5–15)
BUN: 15 mg/dL (ref 8–23)
CO2: 24 mmol/L (ref 22–32)
Calcium: 9.5 mg/dL (ref 8.9–10.3)
Chloride: 110 mmol/L (ref 98–111)
Creatinine, Ser: 1.69 mg/dL — ABNORMAL HIGH (ref 0.44–1.00)
GFR, Estimated: 30 mL/min — ABNORMAL LOW (ref >60)
Glucose, Bld: 172 mg/dL — ABNORMAL HIGH (ref 70–99)
Potassium: 4 mmol/L (ref 3.5–5.1)
Sodium: 144 mmol/L (ref 135–145)

## 2020-06-23 LAB — URINALYSIS, ROUTINE W REFLEX MICROSCOPIC
Bacteria, UA: NONE SEEN
Bilirubin Urine: NEGATIVE
Glucose, UA: NEGATIVE mg/dL
Ketones, ur: NEGATIVE mg/dL
Leukocytes,Ua: NEGATIVE
Nitrite: NEGATIVE
Protein, ur: >=300 mg/dL — AB
Specific Gravity, Urine: >1.046 — ABNORMAL HIGH (ref 1.005–1.030)
pH: 5 (ref 5.0–8.0)

## 2020-06-23 LAB — GLUCOSE, CAPILLARY: Glucose-Capillary: 142 mg/dL — ABNORMAL HIGH (ref 70–99)

## 2020-06-23 LAB — HEPATIC FUNCTION PANEL
ALT: 43 U/L (ref 0–44)
AST: 48 U/L — ABNORMAL HIGH (ref 15–41)
Albumin: 3 g/dL — ABNORMAL LOW (ref 3.5–5.0)
Alkaline Phosphatase: 89 U/L (ref 38–126)
Bilirubin, Direct: 0.2 mg/dL (ref 0.0–0.2)
Indirect Bilirubin: 0.7 mg/dL (ref 0.3–0.9)
Total Bilirubin: 0.9 mg/dL (ref 0.3–1.2)
Total Protein: 6.8 g/dL (ref 6.5–8.1)

## 2020-06-23 LAB — AMMONIA: Ammonia: 25 umol/L (ref 9–35)

## 2020-06-23 LAB — POC OCCULT BLOOD, ED: Fecal Occult Bld: NEGATIVE

## 2020-06-23 LAB — PROCALCITONIN: Procalcitonin: 0.37 ng/mL

## 2020-06-23 LAB — BRAIN NATRIURETIC PEPTIDE: B Natriuretic Peptide: 1752.8 pg/mL — ABNORMAL HIGH (ref 0.0–100.0)

## 2020-06-23 MED ORDER — FUROSEMIDE 10 MG/ML IJ SOLN
40.0000 mg | Freq: Two times a day (BID) | INTRAMUSCULAR | Status: DC
  Administered 2020-06-23 – 2020-06-24 (×4): 40 mg via INTRAVENOUS
  Filled 2020-06-23 (×4): qty 4

## 2020-06-23 MED ORDER — APIXABAN 2.5 MG PO TABS
2.5000 mg | ORAL_TABLET | Freq: Two times a day (BID) | ORAL | Status: DC
Start: 2020-06-23 — End: 2020-06-27
  Administered 2020-06-24 – 2020-06-27 (×8): 2.5 mg via ORAL
  Filled 2020-06-23 (×9): qty 1

## 2020-06-23 MED ORDER — METOPROLOL TARTRATE 5 MG/5ML IV SOLN
5.0000 mg | Freq: Once | INTRAVENOUS | Status: AC
  Administered 2020-06-23: 09:00:00 5 mg via INTRAVENOUS
  Filled 2020-06-23: qty 5

## 2020-06-23 MED ORDER — FUROSEMIDE 10 MG/ML IJ SOLN: 40.0000 mg | Freq: Two times a day (BID) | INTRAMUSCULAR | Status: DC

## 2020-06-23 MED ORDER — FUROSEMIDE 10 MG/ML IJ SOLN
40.0000 mg | Freq: Once | INTRAMUSCULAR | Status: AC
  Administered 2020-06-23: 02:00:00 40 mg via INTRAVENOUS
  Filled 2020-06-23: qty 4

## 2020-06-23 MED ORDER — MONTELUKAST SODIUM 10 MG PO TABS
10.0000 mg | ORAL_TABLET | Freq: Every day | ORAL | Status: DC
  Administered 2020-06-24 – 2020-06-26 (×4): 10 mg via ORAL
  Filled 2020-06-23 (×4): qty 1

## 2020-06-23 MED ORDER — SODIUM CHLORIDE 0.9 % IV SOLN
1.0000 g | INTRAVENOUS | Status: DC
  Administered 2020-06-23 – 2020-06-26 (×4): 1 g via INTRAVENOUS
  Filled 2020-06-23 (×4): qty 10

## 2020-06-23 MED ORDER — INSULIN ASPART 100 UNIT/ML ~~LOC~~ SOLN
0.0000 [IU] | SUBCUTANEOUS | Status: DC
  Administered 2020-06-23: 19:00:00 2 [IU] via SUBCUTANEOUS
  Administered 2020-06-23: 09:00:00 1 [IU] via SUBCUTANEOUS
  Administered 2020-06-23 – 2020-06-24 (×2): 2 [IU] via SUBCUTANEOUS
  Administered 2020-06-24 (×5): 1 [IU] via SUBCUTANEOUS
  Administered 2020-06-25: 01:00:00 3 [IU] via SUBCUTANEOUS
  Administered 2020-06-25: 09:00:00 1 [IU] via SUBCUTANEOUS
  Administered 2020-06-25: 13:00:00 2 [IU] via SUBCUTANEOUS

## 2020-06-23 MED ORDER — INSULIN GLARGINE 100 UNIT/ML ~~LOC~~ SOLN
14.0000 [IU] | Freq: Every day | SUBCUTANEOUS | Status: DC
  Administered 2020-06-24 – 2020-06-26 (×4): 14 [IU] via SUBCUTANEOUS
  Filled 2020-06-23 (×6): qty 0.14

## 2020-06-23 MED ORDER — MOMETASONE FURO-FORMOTEROL FUM 200-5 MCG/ACT IN AERO
2.0000 | INHALATION_SPRAY | Freq: Two times a day (BID) | RESPIRATORY_TRACT | Status: DC
  Administered 2020-06-24 – 2020-06-27 (×4): 2 via RESPIRATORY_TRACT
  Filled 2020-06-23 (×2): qty 8.8

## 2020-06-23 MED ORDER — SODIUM CHLORIDE 0.9 % IV SOLN
500.0000 mg | INTRAVENOUS | Status: DC
  Administered 2020-06-23 – 2020-06-25 (×3): 500 mg via INTRAVENOUS
  Filled 2020-06-23 (×3): qty 500

## 2020-06-23 MED ORDER — ATORVASTATIN CALCIUM 10 MG PO TABS
20.0000 mg | ORAL_TABLET | Freq: Every evening | ORAL | Status: DC
  Administered 2020-06-23 – 2020-06-26 (×4): 20 mg via ORAL
  Filled 2020-06-23 (×4): qty 2

## 2020-06-23 MED ORDER — IPRATROPIUM-ALBUTEROL 0.5-2.5 (3) MG/3ML IN SOLN
3.0000 mL | Freq: Four times a day (QID) | RESPIRATORY_TRACT | Status: DC | PRN
  Administered 2020-06-24: 17:00:00 3 mL via RESPIRATORY_TRACT
  Filled 2020-06-23: qty 3

## 2020-06-23 MED ORDER — METOPROLOL SUCCINATE ER 50 MG PO TB24
50.0000 mg | ORAL_TABLET | Freq: Every day | ORAL | Status: DC
  Administered 2020-06-23 – 2020-06-27 (×5): 50 mg via ORAL
  Filled 2020-06-23: qty 2
  Filled 2020-06-23 (×4): qty 1

## 2020-06-23 MED ORDER — VALPROATE SODIUM 100 MG/ML IV SOLN
250.0000 mg | Freq: Four times a day (QID) | INTRAVENOUS | Status: DC
  Administered 2020-06-24 (×3): 250 mg via INTRAVENOUS
  Filled 2020-06-23 (×8): qty 2.5

## 2020-06-23 MED ORDER — IOHEXOL 350 MG/ML SOLN
75.0000 mL | Freq: Once | INTRAVENOUS | Status: AC | PRN
  Administered 2020-06-23: 75 mL via INTRAVENOUS

## 2020-06-23 MED ORDER — GABAPENTIN 100 MG PO CAPS
100.0000 mg | ORAL_CAPSULE | Freq: Every day | ORAL | Status: DC
  Administered 2020-06-24 – 2020-06-26 (×4): 100 mg via ORAL
  Filled 2020-06-23 (×4): qty 1

## 2020-06-23 MED ORDER — ALLOPURINOL 100 MG PO TABS
200.0000 mg | ORAL_TABLET | Freq: Every day | ORAL | Status: DC
  Administered 2020-06-23 – 2020-06-27 (×5): 200 mg via ORAL
  Filled 2020-06-23 (×5): qty 2

## 2020-06-23 MED ORDER — LACTULOSE ENEMA
300.0000 mL | Freq: Once | ORAL | Status: AC
  Administered 2020-06-23: 300 mL via RECTAL
  Filled 2020-06-23: qty 300

## 2020-06-23 MED ORDER — ISOSORBIDE MONONITRATE ER 30 MG PO TB24
30.0000 mg | ORAL_TABLET | Freq: Every day | ORAL | Status: DC
  Administered 2020-06-23 – 2020-06-27 (×5): 30 mg via ORAL
  Filled 2020-06-23 (×5): qty 1

## 2020-06-23 NOTE — ED Notes (Signed)
Pt remained very anxious when BP cuff was inflating, unable to get an accurate Blood pressure.

## 2020-06-23 NOTE — ED Notes (Addendum)
[place] abd and thyroid to be done by next shift which comes on at 5:30.

## 2020-06-23 NOTE — Progress Notes (Signed)
EEG complete - results pending 

## 2020-06-23 NOTE — Progress Notes (Signed)
Patient placed in wrist restraints per order. Patient is agitated. Unable to redirect and attempting to hit staff. Daughter [name] called and notified of restraints order. Will continue hourly rounding on patient.

## 2020-06-23 NOTE — ED Notes (Signed)
Retention kit ordered from materials.

## 2020-06-23 NOTE — H&P (Signed)
History and Physical    Brittany Roberts VFI:[id number] DOB: Feb 06, 1941 DOA: 06/22/2020  PCP: [name], [name], MD Patient coming from: SNF  Chief Complaint: AMS  HPI: Brittany Roberts is a 79 y.o. female with medical history significant of CAD status post PCI, morbid obesity, chronic diastolic CHF, chronic hypoxic respiratory failure on 3 L home oxygen, OSA not on CPAP, hypertension, hyperlipidemia, hypothyroidism, bipolar disorder, depression, fibromyalgia, CVA, GERD, gout, sickle cell trait, lupus, type 2 diabetes, peripheral neuropathy, seizure disorder, COPD, CKD stage IIIb, history of recent LV apical thrombus treated with 3 months of anticoagulation, Covid infection in March 2021 presenting to the ED via EMS from her nursing home for evaluation of altered mental status.   Patient was recently admitted to the hospital 06/07/2020-06/17/2020 for acute metabolic encephalopathy secondary to UTI and underlying dementia.  She was treated with a 3-day course of ceftriaxone.  Brain MRI done during this hospitalization was negative for acute findings.  Patient was also found to be in new onset A. fib during this hospitalization and was started on Eliquis for anticoagulation.  She was previously on torsemide for diastolic CHF which was stopped during this hospitalization due to poor oral intake and acute renal failure.  Creatinine up to 3.17 during this hospitalization and trended down to 1.59 at the time of discharge with IV fluid hydration.  Patient is somnolent and only intermittently opening her eyes and moving all extremities.  She is not alert and not able to answer any questions.  History provided by daughter at bedside who states that patient was previously living independently and performing most of her ADLs without any difficulty.  Her sister lives very close to the patient's house and was helping her with her medications.  Patient did have some memory issues but was overall doing quite well.  However, she  has had a significant decline in her health recently.  Since her recent hospitalization for UTI, she has been at a rehab facility.  On Friday when the patient's daughter went to check on her she appeared somnolent after staff there had given her sedating medications for agitation.  Yesterday the patient's granddaughter went to check on her and noticed that she was having difficulty breathing.  Daughter states today when she went back to check on the patient she appeared lethargic, confused, and was not responding to her.  She was moving her arms and legs.  She has had constipation so staff at the nursing facility gave her something for it  ED Course: Code stroke was activated upon ED arrival.  Head CT showing a chronic right MCA distribution infarct and underlying atrophy with mild chronic small vessel ischemic disease; no acute intracranial abnormality.  Neurology recommended metabolic work-up for acute delirium, brain MRI, and EEG.  Afebrile.  Tachypneic and requiring 4 L supplemental oxygen.  WBC 13.0, hemoglobin 10.5 (at baseline), platelet 202K.  Sodium 142, potassium 4.0, chloride 112, bicarb 20, anion gap 10, BUN 15, creatinine 1.6 (at baseline), glucose 262.  AST 56, ALT 46.  Alk phos and T bili within normal limits.  INR 1.5.  Blood ethanol level undetectable.  ABG with pH 7.36, PCO2 36, and PO2 96.  Ammonia level elevated at 71, previously <9 on labs done 2 weeks ago.  TSH within normal range.  SARS-CoV-2 PCR test and influenza panel both negative.  FOBT negative.  BNP significantly elevated at 1752.  UA without signs of infection.  Urine culture pending.  UDS negative.  Chest x-ray  showing pulmonary edema.  CT angiogram chest negative for PE.  Showing an ill-defined right hilar soft tissue mass with adjacent right suprahilar peribronchial thickening concerning for the presence of a primary lung malignancy.  Mild right upper lobe and basilar atelectasis and/or infiltrate.  Small bilateral pleural  effusions.  Stable marked severity enlargement of the left lobe of the thyroid gland.  Abdominal x-ray showing normal bowel gas pattern without obstruction or ileus.  CT abdomen pelvis showing sigmoid diverticulosis, stable bilateral simple renal cysts, calcified uterine fibroids, and aortic atherosclerosis.  Patient was given IV Cardizem 10 mg, IV Lasix 40 mg, IM ziprasidone 20 mg, and IV Keppra 1000 mg.  Review of Systems:  All systems reviewed and apart from history of presenting illness, are negative.  Past Medical History:  Diagnosis Date  . Acute delirium 01/14/2017  . Anxiety   . Arthritis    "all over"  . Asthma   . Bipolar disorder ([place])   . Complication of anesthesia    "w/right foot OR they gave me too much and they couldn't get me woke"  . Congestive heart failure, unspecified   . Coronary artery calcification seen on CAT scan 01/14/2017  . Coronary artery disease    "I've got 1 stent" (06/30/2015)  . Depression   . Discoid lupus   . Fibromyalgia    "I've been told I have this; can't take Lyrica cause I'm allergic to it" (06/30/2015)  . GERD (gastroesophageal reflux disease)   . History of gout   . History of hiatal hernia    "real bad" (06/30/2015)  . Hypertension   . Hypothyroidism   . Memory loss   . On home oxygen therapy    "2L q hs" (06/30/2015)  . OSA (obstructive sleep apnea)    "just use my oxygen; no mask" (06/30/2015)  . Other and unspecified hyperlipidemia   . Penetrating foot wound    left nonhealing foot wound on the dorsal surface  . Peripheral neuropathy   . Pneumonia "many of times"  . Refusal of blood transfusions as patient is Jehovah's Witness   . Renal failure, unspecified    "kidneys not working 100%" (06/30/2015)  . Seizure ([place])   . Sickle cell trait ([place])   . Systemic lupus ([place])   . Thoracic aortic atherosclerosis ([place]) 01/14/2017  . Type II diabetes mellitus ([place])   . Unspecified vitamin D deficiency     Past Surgical History:   Procedure Laterality Date  . ANKLE ARTHROSCOPY WITH OPEN REDUCTION INTERNAL FIXATION (ORIF) Right 03/2011  . CARDIAC CATHETERIZATION N/A 07/01/2015   Procedure: Left Heart Cath and Coronary Angiography;  Surgeon: Adrian Prows, MD;  Location: [facility];  Service: Cardiovascular;  Laterality: N/A;  . CARDIAC CATHETERIZATION N/A 07/01/2015   Procedure: Intravascular Pressure Wire/FFR Study;  Surgeon: Adrian Prows, MD;  Location: [facility];  Service: Cardiovascular;  Laterality: N/A;  . CARPAL TUNNEL RELEASE Bilateral   . CATARACT EXTRACTION W/ INTRAOCULAR LENS  IMPLANT, BILATERAL Bilateral   . CHOLECYSTECTOMY OPEN    . COLONOSCOPY N/A 12/10/2013   Procedure: COLONOSCOPY;  Surgeon: Inda Castle, MD;  Location: [facility];  Service: Endoscopy;  Laterality: N/A;  . FRACTURE SURGERY    . INCISION AND DRAINAGE ABSCESS Left    foot "under my little toe"  . KNEE LIGAMENT RECONSTRUCTION Right   . LEFT HEART CATHETERIZATION WITH CORONARY ANGIOGRAM N/A 08/08/2012   Procedure: LEFT HEART CATHETERIZATION WITH CORONARY ANGIOGRAM;  Surgeon: [name],  MD;  Location: [facility];  Service: Cardiovascular;  Laterality: N/A;  . NASAL SINUS SURGERY    . PERCUTANEOUS CORONARY STENT INTERVENTION (PCI-S) N/A 08/21/2012   Procedure: PERCUTANEOUS CORONARY STENT INTERVENTION (PCI-S);  Surgeon: [name], MD;  Location: [facility];  Service: Cardiovascular;  Laterality: N/A;  . TUBAL LIGATION       reports that she quit smoking about 21 years ago. Her smoking use included cigarettes. She started smoking about 60 years ago. She has a 120.00 pack-year smoking history. She has never used smokeless tobacco. She reports that she does not drink alcohol and does not use drugs.  Allergies  Allergen Reactions  . Ace Inhibitors Other (See Comments)    Coincided with sig bump in creat. Retried and creat bumped again.   . Isosorb Dinitrate-Hydralazine Other (See Comments)    Sleep all the  time.   Marland Kitchen Penicillins Anaphylaxis, Swelling and Rash    Has patient had a PCN reaction causing immediate rash, facial/tongue/throat swelling, SOB or lightheadedness with hypotension: Yes Has patient had a PCN reaction causing severe rash involving mucus membranes or skin necrosis: Yes Has patient had a PCN reaction that required hospitalization: Yes Has patient had a PCN reaction occurring within the last 10 years: No If all of the above answers are "NO", then may proceed with Cephalosporin use.   Marland Kitchen Buspirone Other (See Comments)    pain   . Pregabalin Swelling  . Ropinirole Hydrochloride Swelling  . Amantadines Rash    "Swelling of the tongue"    Family History  Problem Relation Age of Onset  . Heart disease Mother   . Diabetes Mother   . Clotting disorder Mother   . Pneumonia Father   . Rheum arthritis Father   . Diabetes Sister   . Diabetes Sister   . Asthma Brother   . Cancer Brother   . Kidney disease Brother   . Lupus Son   . Heart disease Son   . Heart disease Daughter   . Colon cancer Neg Hx     Prior to Admission medications   Medication Sig Start Date End Date Taking? Authorizing Provider  Accu-Chek Softclix Lancets lancets Use to test blood sugars 1-2 times daily.Dx:e11.9 05/12/20   [name], [name], MD  acetaminophen (TYLENOL) 500 MG tablet Take 500 mg by mouth every 6 (six) hours as needed for moderate pain.    [provider]  allopurinol (ZYLOPRIM) 100 MG tablet Take 2 tablets (200 mg total) by mouth daily. 02/18/20   [name], [name], MD  apixaban (ELIQUIS) 2.5 MG TABS tablet Take 1 tablet (2.5 mg total) by mouth 2 (two) times daily. 06/13/20   [place] Donnamarie Poag, DO  Ascorbic Acid (VITAMIN C) 500 MG CAPS Take 1 capsule by mouth daily. 02/26/20   [name], [name], MD  atorvastatin (LIPITOR) 20 MG tablet TAKE 1 TABLET(20 MG) BY MOUTH DAILY Patient taking differently: Take 20 mg by mouth daily.  06/03/20   [name], [name], MD  B Complex Vitamins (VITAMIN B  COMPLEX) TABS Take 1 tablet by mouth daily.    [provider]  Blood Glucose Monitoring Suppl (ACCU-CHEK GUIDE) w/Device KIT Use to test blood sugars 1-2 times daily. Dx:e11.9 05/14/20   [name], [name], MD  Cholecalciferol (VITAMIN D3) 5000 UNITS CAPS Take 5,000 Units by mouth daily.    [provider]  DULoxetine (CYMBALTA) 60 MG capsule Take 1 capsule (60 mg total) by mouth daily. 10/20/19  [name], [name], MD  ferrous sulfate 325 (65 FE) MG tablet Take 325 mg by mouth 2 (two) times daily with a meal.     [provider]  Fluocinolone Acetonide Scalp 0.01 % OIL Apply 1 application topically at bedtime as needed (itching scalp).  07/30/19   [provider]  gabapentin (NEURONTIN) 100 MG capsule Take 1 capsule (100 mg total) by mouth at bedtime. 06/13/20   [place] Eric J, DO  glucose blood (ACCU-CHEK GUIDE) test strip Use to test blood sugars 1-2 times daily. Dx: e11.9 05/14/20   [name], [name], MD  insulin glargine (LANTUS) 100 UNIT/ML injection Inject 0.14 mLs (14 Units total) into the skin daily. 06/15/20   [place] Eric J, DO  Insulin Pen Needle (B-D ULTRAFINE III SHORT PEN) 31G X 8 MM MISC CHECK BLOOD SUGAR 3 TIMES A DAY 03/15/19   [name], [name], MD  isosorbide mononitrate (IMDUR) 30 MG 24 hr tablet TAKE 1 TABLET(30 MG) BY MOUTH DAILY Patient taking differently: Take 30 mg by mouth daily.  02/04/20   Patwardhan, Reynold Bowen, MD  levETIRAcetam (KEPPRA) 500 MG tablet Take 1 tablet (500 mg total) by mouth 2 (two) times daily. 07/30/19   Suzzanne Cloud, NP  melatonin 3 MG TABS tablet Take 1 tablet (3 mg total) by mouth at bedtime. 06/16/20   [place] Eric J, DO  memantine (NAMENDA) 10 MG tablet Take 10 mg by mouth daily.    [provider]  metoprolol succinate (TOPROL-XL) 50 MG 24 hr tablet TAKE 1 TABLET BY MOUTH EVERY DAY. TAKE WITH OR IMMEDIATELY FOLLOWING A MEAL Patient taking differently: Take 50 mg by mouth daily.  12/17/19   [name], [name], MD   montelukast (SINGULAIR) 10 MG tablet TAKE 1 TABLET(10 MG) BY MOUTH AT BEDTIME Patient taking differently: Take 10 mg by mouth at bedtime.  03/05/20   [name], [name], MD  Omega-3 Fatty Acids (FISH OIL) 1000 MG CAPS Take 1,000 mg by mouth daily.     [provider]  pantoprazole (PROTONIX) 40 MG tablet TAKE 1 TABLET(40 MG) BY MOUTH DAILY Patient taking differently: Take 40 mg by mouth daily.  08/28/19   [name], [name], MD  Polyethyl Glycol-Propyl Glycol (SYSTANE) 0.4-0.3 % GEL ophthalmic gel Place 1 application into both eyes every 4 (four) hours as needed (dry eyes).    [provider]  PROAIR HFA 108 (90 Base) MCG/ACT inhaler INHALE 2 PUFF BY MOUTH EVERY 4 HOURS AS NEEDED USE ONLY IF YOU ARE WHEEZING Patient taking differently: Inhale 2 puffs into the lungs every 4 (four) hours as needed for wheezing.  10/17/19   [name], [name], MD  Spacer/Aero-Holding Chambers (AEROCHAMBER PLUS) inhaler Use as instructed to use with inahaler. 07/17/19   [name], [name], MD  SYMBICORT 80-4.5 MCG/ACT inhaler INHALE 2 PUFFS INTO THE LUNGS TWICE DAILY Patient taking differently: Inhale 2 puffs into the lungs in the morning and at bedtime.  03/31/20   [name], [name], MD    Physical Exam: Vitals:   06/23/20 0100 06/23/20 0130 06/23/20 0215 06/23/20 0500  BP: 98/72 (!) 107/37 (!) 104/51 (!) 112/58  Pulse: (!) 54 (!) 57 68 (!) 50  Resp: (!) 21 (!) 21 (!) 22 19  Temp:      TempSrc:      SpO2: 100% 100% 100% 100%    Physical Exam Constitutional:      Appearance: She is not diaphoretic.  HENT:     Head: Normocephalic and atraumatic.  Mouth/Throat:     Mouth: Mucous membranes are dry.     Pharynx: Oropharynx is clear.  Eyes:     Conjunctiva/sclera: Conjunctivae normal.  Cardiovascular:     Rate and Rhythm: Normal rate and regular rhythm.     Pulses: Normal pulses.  Pulmonary:     Effort: Pulmonary effort is normal.     Breath sounds: Rales present. No wheezing or rhonchi.  Abdominal:      General: Bowel sounds are normal. There is no distension.     Palpations: Abdomen is soft.     Tenderness: There is no abdominal tenderness. There is no guarding or rebound.  Musculoskeletal:        General: No swelling or tenderness.     Cervical back: Normal range of motion and neck supple.  Skin:    General: Skin is warm and dry.  Neurological:     Comments: Somnolent but opening eyes intermittently and moving all extremities spontaneous Not alert     Labs on Admission: I have personally reviewed following labs and imaging studies  CBC: Recent Labs  Lab 06/22/20 1913 06/22/20 2029 06/22/20 2035 06/22/20 2111  WBC 13.0* 12.9*  --   --   NEUTROABS  --  8.8*  --   --   HGB 10.5* 9.8* 11.2* 10.9*  HCT 31.9* 29.0* 33.0* 32.0*  MCV 78.0* 76.9*  --   --   PLT 202 184  --   --    Basic Metabolic Panel: Recent Labs  Lab 06/17/20 0206 06/22/20 1913 06/22/20 2035 06/22/20 2111  NA 145 142 147* 147*  K 3.6 4.0 4.0 3.7  CL 116* 112* 114*  --   CO2 20* 20*  --   --   GLUCOSE 80 262* 220*  --   BUN 28* 15 16  --   CREATININE 1.64* 1.64* 1.40*  --   CALCIUM 9.3 9.8  --   --    GFR: Estimated Creatinine Clearance: 42.8 mL/min (A) (by C-G formula based on SCr of 1.4 mg/dL (H)). Liver Function Tests: Recent Labs  Lab 06/22/20 1913  AST 56*  ALT 46*  ALKPHOS 94  BILITOT 1.2  PROT 7.2  ALBUMIN 3.1*   No results for input(s): LIPASE, AMYLASE in the last 168 hours. Recent Labs  Lab 06/22/20 2231  AMMONIA 71*   Coagulation Profile: Recent Labs  Lab 06/22/20 2029  INR 1.5*   Cardiac Enzymes: No results for input(s): CKTOTAL, CKMB, CKMBINDEX, TROPONINI in the last 168 hours. BNP (last 3 results) No results for input(s): PROBNP in the last 8760 hours. HbA1C: No results for input(s): HGBA1C in the last 72 hours. CBG: Recent Labs  Lab 06/16/20 1139 06/16/20 1654 06/16/20 2119 06/17/20 0625 06/22/20 1945  GLUCAP 118* 122* 90 74 205*   Lipid  Profile: No results for input(s): CHOL, HDL, LDLCALC, TRIG, CHOLHDL, LDLDIRECT in the last 72 hours. Thyroid Function Tests: Recent Labs    06/22/20 2231  TSH 0.840   Anemia Panel: No results for input(s): VITAMINB12, FOLATE, FERRITIN, TIBC, IRON, RETICCTPCT in the last 72 hours. Urine analysis:    Component Value Date/Time   COLORURINE AMBER (A) 06/23/2020 0055   APPEARANCEUR HAZY (A) 06/23/2020 0055   LABSPEC >1.046 (H) 06/23/2020 0055   PHURINE 5.0 06/23/2020 0055   GLUCOSEU NEGATIVE 06/23/2020 0055   HGBUR MODERATE (A) 06/23/2020 0055   HGBUR negative 02/26/2009 [place] 06/23/2020 0055   BILIRUBINUR NEG 04/05/2014 1455   KETONESUR NEGATIVE  06/23/2020 0055   PROTEINUR >=300 (A) 06/23/2020 0055   UROBILINOGEN 0.2 08/01/2014 2305   NITRITE NEGATIVE 06/23/2020 0055   LEUKOCYTESUR NEGATIVE 06/23/2020 0055    Radiological Exams on Admission: CT Angio Chest PE W and/or Wo Contrast  Result Date: 06/23/2020 CLINICAL DATA:  Altered mental status. EXAM: CT ANGIOGRAPHY CHEST WITH CONTRAST TECHNIQUE: Multidetector CT imaging of the chest was performed using the standard protocol during bolus administration of intravenous contrast. Multiplanar CT image reconstructions and MIPs were obtained to evaluate the vascular anatomy. CONTRAST:  57m OMNIPAQUE IOHEXOL 350 MG/ML SOLN COMPARISON:  January 13, 2017 FINDINGS: Cardiovascular: There is marked severity calcification of the aortic arch. Satisfactory opacification of the pulmonary arteries to the segmental level. No evidence of pulmonary embolism. There is mild cardiomegaly with marked severity coronary artery calcification. No pericardial effusion. Mediastinum/Nodes: Mild pretracheal and subcarinal lymphadenopathy is seen. Marked severity narrowing of the origin of the right main bronchus is noted (axial CT image 31, CT series number 6). An ill-defined right hilar soft tissue mass is seen which measures approximately 2.4 cm x  1.8 cm. Adjacent right suprahilar peribronchial thickening is also present (approximately 7.5 mm in thickness). Stable marked severity enlargement of the left lobe of the thyroid gland is seen. The esophagus demonstrates no significant findings. Lungs/Pleura: Mild atelectasis and/or infiltrate is seen within the right apex, posterior aspect of the right upper lobe and bilateral lung bases. Small bilateral pleural effusions are noted. No pneumothorax is identified. Upper Abdomen: Surgical clips are seen within the gallbladder fossa. Musculoskeletal: No chest wall abnormality. No acute or significant osseous findings. Review of the MIP images confirms the above findings. IMPRESSION: 1. No evidence of pulmonary embolism. 2. Ill-defined right hilar soft tissue mass with adjacent right suprahilar peribronchial thickening, concerning for the presence of a primary lung malignancy. 3. Mild right upper lobe and bibasilar atelectasis and/or infiltrate. 4. Small bilateral pleural effusions. 5. Stable marked severity enlargement of the left lobe of the thyroid gland. 6. Aortic atherosclerosis. Aortic Atherosclerosis (ICD10-I70.0). Electronically Signed   By: TVirgina NorfolkM.D.   On: 06/23/2020 01:10   CT ABDOMEN PELVIS W CONTRAST  Result Date: 06/23/2020 CLINICAL DATA:  Altered mental status. EXAM: CT ABDOMEN AND PELVIS WITH CONTRAST TECHNIQUE: Multidetector CT imaging of the abdomen and pelvis was performed using the standard protocol following bolus administration of intravenous contrast. CONTRAST:  736mOMNIPAQUE IOHEXOL 350 MG/ML SOLN COMPARISON:  December 05, 2019 FINDINGS: The study is limited secondary to patient motion. Lower chest: Mild atelectasis and/or infiltrate is seen within the bilateral lung bases. Small bilateral pleural effusions are noted. Hepatobiliary: No focal liver abnormality is seen. Status post cholecystectomy. No biliary dilatation. Pancreas: Unremarkable. No pancreatic ductal dilatation or  surrounding inflammatory changes. Spleen: Chronic curvilinear calcification is seen along the periphery of the spleen. Adrenals/Urinary Tract: Adrenal glands are unremarkable. Kidneys are normal in size, without renal calculi or hydronephrosis. Multiple stable bilateral simple renal cysts are seen. Bladder is unremarkable. Stomach/Bowel: Stomach is within normal limits. The appendix is not clearly identified. No evidence of bowel wall thickening, distention, or inflammatory changes. Noninflamed diverticula are seen within the sigmoid colon. Vascular/Lymphatic: There is marked severity aortic calcification and atherosclerosis. No enlarged abdominal or pelvic lymph nodes. Reproductive: Stable calcified uterine fibroids are present. The bilateral adnexa are unremarkable. Other: No abdominal wall hernia or abnormality. No abdominopelvic ascites. Musculoskeletal: Degenerative changes seen throughout the lumbar spine. IMPRESSION: 1. Mild atelectasis and/or infiltrate within the bilateral lung bases with small bilateral  pleural effusions. 2. Sigmoid diverticulosis. 3. Stable bilateral simple renal cysts. 4. Calcified uterine fibroids. 5. Aortic atherosclerosis. Aortic Atherosclerosis (ICD10-I70.0). Electronically Signed   By: Virgina Norfolk M.D.   On: 06/23/2020 01:15   DG ABD ACUTE 2+V W 1V CHEST  Result Date: 06/22/2020 CLINICAL DATA:  Altered level of consciousness EXAM: DG ABDOMEN ACUTE WITH 1 VIEW CHEST COMPARISON:  06/07/2020 FINDINGS: Supine and upright frontal views of the abdomen as well as an upright frontal view of the chest are obtained. The cardiac silhouette is enlarged but stable. Increased central vascular congestion and diffuse interstitial prominence, with developing consolidation at the lung bases. Small left effusion. No pneumothorax. Bowel gas pattern is unremarkable without obstruction or ileus. No masses or abnormal calcifications. No free gas in the greater peritoneal sac. IMPRESSION: 1.  Worsening volume status, with developing pulmonary edema. 2. Unremarkable bowel gas pattern. Electronically Signed   By: Randa Ngo M.D.   On: 06/22/2020 23:21   CT HEAD CODE STROKE WO CONTRAST  Result Date: 06/22/2020 CLINICAL DATA:  Code stroke. Initial evaluation for neuro deficit, stroke suspected. EXAM: CT HEAD WITHOUT CONTRAST TECHNIQUE: Contiguous axial images were obtained from the base of the skull through the vertex without intravenous contrast. COMPARISON:  Prior MRI from 06/12/2020 FINDINGS: Brain: Examination mildly degraded by motion artifact. Age-related cerebral atrophy with chronic microvascular ischemic disease. Chronic right MCA distribution infarct involving the anterior right frontal lobe. No acute intracranial hemorrhage. No acute large vessel territory infarct. No mass lesion, midline shift or mass effect. No hydrocephalus or extra-axial fluid collection. Vascular: No hyperdense vessel. Scattered vascular calcifications noted within the carotid siphons. Skull: Scalp soft tissues and calvarium within normal limits. Sinuses/Orbits: Right gaze noted. Prior bilateral ocular lens replacement. Chronic right maxillary sinusitis. Mastoid air cells are clear. Other: None. ASPECTS [facility] Stroke Program Early CT Score) - Ganglionic level infarction (caudate, lentiform nuclei, internal capsule, insula, M1-M3 cortex): 7 - Supraganglionic infarction (M4-M6 cortex): 3 Total score (0-10 with 10 being normal): 10 IMPRESSION: 1. No acute intracranial infarct or other abnormality. 2. ASPECTS is 10. 3. Chronic right MCA distribution infarct. 4. Underlying atrophy with mild chronic small vessel ischemic disease. 5. Chronic right maxillary sinusitis. These results were communicated to Dr. Cheral Marker at 8:27 [place] 12/19/2021by text page via the [facility] messaging system. Electronically Signed   By: Jeannine Boga M.D.   On: 06/22/2020 20:28    EKG: Independently reviewed.  Unable to interpret secondary  to significant artifact.  Repeat EKG pending.  Assessment/Plan Principal Problem:   Acute encephalopathy Active Problems:   COPD (chronic obstructive pulmonary disease) (HCC)   Acute exacerbation of congestive heart failure (HCC)   CAP (community acquired pneumonia)   Lung mass   Acute encephalopathy: Head CT negative for acute finding.  Seen by neurology and recommending metabolic work-up.  Ammonia elevated at 71, previously <9 on labs done 2 weeks ago.  Chest CT showing possible pneumonia.  UA without signs of infection.  Blood ethanol level undetectable.  UDS negative.  TSH normal.  No evidence of hypercarbia on arterial blood gas.  Patient is currently somnolent but opening her eyes intermittently and moving all extremities.  Not alert or oriented. -Lactulose enema ordered as patient is currently somnolent and not able to take p.o. meds.  Repeat ammonia level in the morning.  Antibiotics for possible pneumonia.  Urine culture pending.  Brain MRI and EEG ordered.  Patient received IV Keppra 1000 mg loading dose in the ED and neurology recommending  continuing Keppra 500 mg twice daily.  Frequent neurochecks.  Acute on chronic hypoxic respiratory failure secondary to acute exacerbation of chronic diastolic CHF and possible CAP: Uses 3 L home oxygen and now requiring 4 L.  Although CT angiogram without overt pulmonary edema, does appear volume overloaded with rales on exam and significantly elevated BNP of 1752.  Her home torsemide was stopped during recent hospitalization in the setting of AKI and poor oral intake.  Recent echocardiogram done 06/08/2020 showing normal LVEF of 55-60% and indeterminate LV diastolic parameters.  CT also showing mild right upper lobe and basilar atelectasis and/or infiltrate.  SARS-CoV-2 PCR test and influenza panel both negative.  Could possibly represent bacterial pneumonia as she does have mild leukocytosis on labs. -Patient was given IV Lasix 40 mg in the ED. Blood  pressure currently soft.  Repeat labs in the morning to check renal function.  Order additional doses of Lasix based on renal function and if blood pressure is able to tolerate.  Monitor intake and output, daily weights.  Continuous pulse ox, continue supplemental oxygen. -Start ceftriaxone and azithromycin.  Penicillin allergy listed but she is able to tolerate cephalosporins (received ceftriaxone during recent hospitalization).  Check procalcitonin level and order blood cultures.  Lung mass: CT angiogram showing an ill-defined right hilar soft tissue mass with adjacent right suprahilar peribronchial thickening concerning for the presence of a primary lung malignancy.  Discussed with the patient's daughter who believes this is a new finding as she has never been told about her mother having a lung mass prior to today's visit. -After patient recovers from her current medical problems, please ensure oncology follow-up for further work-up.  Paroxysmal A. fib: Diagnosed during recent hospitalization and also has a history of recent LV apical thrombus treated with 3 months of anticoagulation.  Rate controlled at present. -Cardiac monitoring.  Unable to assess rhythm on EKG due to significant artifact, repeat EKG ordered and currently pending.  Unable to take p.o. meds at this time.  Resume home Eliquis after pharmacy med rec is done and after patient is more awake and able to tolerate p.o. meds.  Holding metoprolol at this time as blood pressure is soft after Lasix.  CKD stage IIIb: Stable.  Creatinine currently at baseline. -Continue to monitor renal function closely  Hypertension -Blood pressure currently soft after IV Lasix.  Hold any additional antihypertensive agents at this time.  Hyperlipidemia -Resume statin after pharmacy med rec is done and after patient is no longer n.p.o./able to tolerate p.o. meds.  Insulin-dependent type 2 diabetes: A1c 8.5 on labs done 2 weeks ago. -Pharmacy med rec  pending.  Order sliding scale insulin sensitive every 4 hours.  Mildly elevated transaminases: Possibly due to hepatic congestion from decompensated CHF.  AST 56, ALT 46.  Alk phos and T bili within normal limits.  Transaminases were normal on labs done 2 weeks ago. -Right upper quadrant ultrasound, monitor LFTs  COPD: Not wheezing at present. -DuoNebs as needed  Enlarged thyroid gland: CT showing stable marked severity enlargement of the left lobe of the thyroid gland.  TSH normal. -Ultrasound ordered for further evaluation  DVT prophylaxis: Resume Eliquis once pharmacy med rec is done and after patient is more awake and able to take p.o. meds. Code Status: DNR.  Confirmed with the patient's daughter at bedside. Disposition Plan: Status is: Inpatient  Remains inpatient appropriate because:Altered mental status, IV treatments appropriate due to intensity of illness or inability to take PO and Inpatient level of  care appropriate due to severity of illness   Dispo: The patient is from: SNF              Anticipated d/c is to: SNF              Anticipated d/c date is: > 3 days              Patient currently is not medically stable to d/c.  The medical decision making on this patient was of high complexity and the patient is at high risk for clinical deterioration, therefore this is a level 3 visit.  Shela Leff MD [facility]  If 7PM-7AM, please contact night-coverage [email or web address]  06/23/2020, 5:09 AM

## 2020-06-23 NOTE — ED Notes (Signed)
Daughter updated 

## 2020-06-23 NOTE — Procedures (Signed)
Patient Name: Brittany Roberts  MRN: [id number]  Epilepsy Attending: Lora Havens  Referring Physician/Provider: Dr Derrick Ravel Date: 06/23/2020 Duration: 27.45 mins  Patient history: 79 year old female with a history of atrial fibrillation on Eliquis, seizure x 1, recent UTI with AMS, re-presenting with altered mental status and left sided weakness. EEG to evaluate for seizure.  Level of alertness: Awake  AEDs during EEG study: LEv  Technical aspects: This EEG study was done with scalp electrodes positioned according to the 10-20 International system of electrode placement. Electrical activity was acquired at a sampling rate of 500Hz  and reviewed with a high frequency filter of 70Hz  and a low frequency filter of 1Hz . EEG data were recorded continuously and digitally stored.   Description: No posterior dominant rhythm was seen. EEG showed continuous generalized 3 to 6 Hz theta-delta slowing.  Hyperventilation and photic stimulation were not performed.     ABNORMALITY -Continuous slow, generalized  IMPRESSION: This study is suggestive of moderate diffuse encephalopathy, nonspecific etiology. No seizures or epileptiform discharges were seen throughout the recording.  Brittany Roberts [name]
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
## 2020-06-23 NOTE — ED Notes (Addendum)
Pt daughter leaving bedside, requesting to call her at her provided contact number if anything changes.

## 2020-06-23 NOTE — Progress Notes (Signed)
Neurology Progress Note  S: pt can not participate in subjective due to mental status. Daughter states pt was not talking yesterday, but is today. Daughter can understand her. Pt has not been cooperative for care at times.   Brief hx: In review of chart notes, pt had first seizure in July 2018 in the ED noted to be witnessed activity with eye deviation to the left, followed by confusion, agitation and somnolence. Treated with Keppra with no repeated seizure activity. EEG at that time showed background slowing but no epileptiform discharge. MRI 01/2017 showed encephalomalacia involving the right frontal lobe. EEG 07/2019 normal on outpt neuro note. Memory score 07/30/19 27/30. NP does not find outpt f/up since then.    O: Current vital signs: BP (!) 188/94 (BP Location: Right Leg)   Pulse 83   Temp 98 F (36.7 C) (Oral)   Resp (!) 23   SpO2 99%  Vital signs in last 24 hours: Temp:  [98 F (36.7 C)] 98 F (36.7 C) (12/19 1824) Pulse Rate:  [26-118] 83 (12/20 1445) Resp:  [16-31] 23 (12/20 1445) BP: (98-188)/(37-132) 188/94 (12/20 1445) SpO2:  [95 %-100 %] 99 % (12/20 1445)  GENERAL: Awake, alert in NAD. Morbidly obese HEENT: Normocephalic and atraumatic LUNGS: Normal respiratory effort.  CV: RRR on tele.  ABDOMEN: Soft Ext: warm   NEURO:  Mental Status: awake and alert. Will not tell NP her name, place, day, date, or month. She does tell NP her daughter's name at bedside.  Speech/Language: Pt will not participate in naming or repetition. Seems to comprehend, but not participate. She shakes her head no when asked to perform parts of exam. However, she stated "have you lost your mind?" when NP did Babinski.   Cranial Nerves: Much of this exam could not be performed due to lack of cooperation from patient.  II: PERRL. Will not follow finger.  III, IV, VI:  Eyelids elevate symmetrically.  V: will not cooperate or answer.  VII: Face/Mouth is grossly symmetrical.  VIII: hearing intact  to voice. IX, X: Will not open mouth. Phonation is normal.  XI: Will not cooperate. XII: Will not cooperate.  Motor: Will not cooperate.   Tone: is normal and bulk is normal Sensation- Intact to light touch bilaterally. Withdraws to Babinski.   Coordination: Will not cooperate.  DTRs: 2+ throughout Gait- deferred  Medications  Current Facility-Administered Medications:  .  azithromycin (ZITHROMAX) 500 mg in sodium chloride 0.9 % 250 mL IVPB, 500 mg, Intravenous, Q24H, Shela Leff, MD, Stopped at 06/23/20 1028 .  cefTRIAXone (ROCEPHIN) 1 g in sodium chloride 0.9 % 100 mL IVPB, 1 g, Intravenous, Q24H, Shela Leff, MD, Stopped at 06/23/20 0845 .  furosemide (LASIX) injection 40 mg, 40 mg, Intravenous, BID, Danford, Suann Larry, MD, 40 mg at 06/23/20 0901 .  insulin aspart (novoLOG) injection 0-9 Units, 0-9 Units, Subcutaneous, Q4H, Shela Leff, MD, 1 Units at 06/23/20 0830 .  ipratropium-albuterol (DUONEB) 0.5-2.5 (3) MG/3ML nebulizer solution 3 mL, 3 mL, Nebulization, Q6H PRN, Shela Leff, MD .  valproate (DEPACON) 250 mg in dextrose 5 % 50 mL IVPB, 250 mg, Intravenous, Q6H, Kirby-Graham, Karsten Fells, NP  Current Outpatient Medications:  .  acetaminophen (TYLENOL) 500 MG tablet, Take 500 mg by mouth every 6 (six) hours as needed for moderate pain., Disp: , Rfl:  .  allopurinol (ZYLOPRIM) 100 MG tablet, Take 2 tablets (200 mg total) by mouth daily., Disp: 180 tablet, Rfl: 1 .  apixaban (ELIQUIS) 2.5 MG TABS tablet,  Take 1 tablet (2.5 mg total) by mouth 2 (two) times daily., Disp: 60 tablet, Rfl:  .  Ascorbic Acid (VITAMIN C) 500 MG CAPS, Take 1 capsule by mouth daily. (Patient taking differently: Take 500 mg by mouth daily.), Disp: 30 capsule, Rfl: 0 .  atorvastatin (LIPITOR) 20 MG tablet, TAKE 1 TABLET(20 MG) BY MOUTH DAILY (Patient taking differently: Take 20 mg by mouth every evening.), Disp: 90 tablet, Rfl: 0 .  B Complex Vitamins (VITAMIN B COMPLEX) TABS, Take 1  tablet by mouth daily., Disp: , Rfl:  .  Cholecalciferol (VITAMIN D3) 125 MCG (5000 UT) TABS, Take 5,000 Units by mouth daily., Disp: , Rfl:  .  divalproex (DEPAKOTE) 125 MG DR tablet, Take 125 mg by mouth 2 (two) times daily., Disp: , Rfl:  .  DULoxetine (CYMBALTA) 60 MG capsule, Take 1 capsule (60 mg total) by mouth daily., Disp: 90 capsule, Rfl: 2 .  ferrous sulfate 325 (65 FE) MG tablet, Take 325 mg by mouth 2 (two) times daily with a meal. , Disp: , Rfl:  .  Fluocinolone Acetonide Scalp 0.01 % OIL, Apply 1 application topically at bedtime as needed (itching scalp). , Disp: , Rfl:  .  Fluticasone-Salmeterol (ADVAIR) 250-50 MCG/DOSE AEPB, Inhale 1 puff into the lungs 2 (two) times daily., Disp: , Rfl:  .  gabapentin (NEURONTIN) 100 MG capsule, Take 1 capsule (100 mg total) by mouth at bedtime. (Patient taking differently: Take 100 mg by mouth at bedtime. For muscle spasms), Disp: , Rfl:  .  insulin glargine (LANTUS) 100 UNIT/ML injection, Inject 0.14 mLs (14 Units total) into the skin daily. (Patient taking differently: Inject 14 Units into the skin at bedtime.), Disp: 10 mL, Rfl: 11 .  isosorbide mononitrate (IMDUR) 30 MG 24 hr tablet, TAKE 1 TABLET(30 MG) BY MOUTH DAILY (Patient taking differently: Take 30 mg by mouth daily.), Disp: 90 tablet, Rfl: 3 .  levETIRAcetam (KEPPRA) 500 MG tablet, Take 1 tablet (500 mg total) by mouth 2 (two) times daily., Disp: 180 tablet, Rfl: 3 .  melatonin 5 MG TABS, Take 10 mg by mouth at bedtime., Disp: , Rfl:  .  memantine (NAMENDA) 10 MG tablet, Take 10 mg by mouth daily., Disp: , Rfl:  .  metoprolol succinate (TOPROL-XL) 50 MG 24 hr tablet, TAKE 1 TABLET BY MOUTH EVERY DAY. TAKE WITH OR IMMEDIATELY FOLLOWING A MEAL (Patient taking differently: Take 50 mg by mouth daily.), Disp: 90 tablet, Rfl: 1 .  montelukast (SINGULAIR) 10 MG tablet, TAKE 1 TABLET(10 MG) BY MOUTH AT BEDTIME (Patient taking differently: Take 10 mg by mouth at bedtime.), Disp: 90 tablet, Rfl:  3 .  Omega-3 Fatty Acids (FISH OIL) 1000 MG CAPS, Take 1,000 mg by mouth daily. , Disp: , Rfl:  .  pantoprazole (PROTONIX) 40 MG tablet, TAKE 1 TABLET(40 MG) BY MOUTH DAILY (Patient taking differently: Take 40 mg by mouth daily.), Disp: 90 tablet, Rfl: 3 .  Polyethyl Glycol-Propyl Glycol (SYSTANE) 0.4-0.3 % GEL ophthalmic gel, Place 1 application into both eyes every 4 (four) hours as needed (dry eyes)., Disp: , Rfl:  .  PROAIR HFA 108 (90 Base) MCG/ACT inhaler, INHALE 2 PUFF BY MOUTH EVERY 4 HOURS AS NEEDED USE ONLY IF YOU ARE WHEEZING (Patient taking differently: Inhale 2 puffs into the lungs every 4 (four) hours as needed for wheezing or shortness of breath.), Disp: 8.5 g, Rfl: 3 .  Accu-Chek Softclix Lancets lancets, Use to test blood sugars 1-2 times daily.Dx:e11.9, Disp: 100 each, Rfl:  12 .  Blood Glucose Monitoring Suppl (ACCU-CHEK GUIDE) w/Device KIT, Use to test blood sugars 1-2 times daily. Dx:e11.9, Disp: 1 kit, Rfl: 1 .  glucose blood (ACCU-CHEK GUIDE) test strip, Use to test blood sugars 1-2 times daily. Dx: e11.9, Disp: 100 each, Rfl: 12 .  Insulin Pen Needle (B-D ULTRAFINE III SHORT PEN) 31G X 8 MM MISC, CHECK BLOOD SUGAR 3 TIMES A DAY, Disp: 100 each, Rfl: 3 .  melatonin 3 MG TABS tablet, Take 1 tablet (3 mg total) by mouth at bedtime. (Patient not taking: Reported on 06/23/2020), Disp: , Rfl: 0 .  Spacer/Aero-Holding Chambers (AEROCHAMBER PLUS) inhaler, Use as instructed to use with inahaler., Disp: 1 each, Rfl: 1 .  SYMBICORT 80-4.5 MCG/ACT inhaler, INHALE 2 PUFFS INTO THE LUNGS TWICE DAILY (Patient not taking: Reported on 06/23/2020), Disp: 10.2 g, Rfl: 3 Labs CBC    Component Value Date/Time   WBC 11.5 (H) 06/23/2020 0921   RBC 3.91 06/23/2020 0921   HGB 10.2 (L) 06/23/2020 0921   HGB 10.7 (L) 10/29/2014 1100   HCT 30.7 (L) 06/23/2020 0921   HCT 33.0 (L) 10/29/2014 1100   PLT 193 06/23/2020 0921   PLT 229 10/29/2014 1100   MCV 78.5 (L) 06/23/2020 0921   MCV 76 (L)  10/29/2014 1100   MCH 26.1 06/23/2020 0921   MCHC 33.2 06/23/2020 0921   RDW 16.7 (H) 06/23/2020 0921   RDW 15.0 10/29/2014 1100   LYMPHSABS 2.4 06/22/2020 2029   MONOABS 1.5 (H) 06/22/2020 2029   EOSABS 0.1 06/22/2020 2029   BASOSABS 0.1 06/22/2020 2029    CMP     Component Value Date/Time   NA 144 06/23/2020 0921   NA 139 01/24/2017 0000   K 4.0 06/23/2020 0921   CL 110 06/23/2020 0921   CO2 24 06/23/2020 0921   GLUCOSE 172 (H) 06/23/2020 0921   BUN 15 06/23/2020 0921   BUN 35 (A) 01/24/2017 0000   CREATININE 1.69 (H) 06/23/2020 0921   CREATININE 1.43 (H) 09/08/2016 1121   CALCIUM 9.5 06/23/2020 0921   PROT 6.8 06/23/2020 0921   PROT 7.4 10/29/2014 1100   ALBUMIN 3.0 (L) 06/23/2020 0921   ALBUMIN 3.7 10/29/2014 1100   AST 48 (H) 06/23/2020 0921   ALT 43 06/23/2020 0921   ALKPHOS 89 06/23/2020 0921   BILITOT 0.9 06/23/2020 0921   BILITOT 0.3 10/29/2014 1100   GFRNONAA 30 (L) 06/23/2020 0921   GFRNONAA 36 (L) 09/08/2016 1121   GFRAA 33 (L) 12/07/2019 0415   GFRAA 41 (L) 09/08/2016 1121    glycosylated hemoglobin  Lipid Panel     Component Value Date/Time   CHOL 103 07/19/2014 1239   TRIG 81 07/19/2014 1239   HDL 38 (L) 07/19/2014 1239   CHOLHDL 2.7 07/19/2014 1239   VLDL 16 07/19/2014 1239   LDLCALC 49 07/19/2014 1239   LDLDIRECT 59 02/16/2013 1104     Imaging I have reviewed images in epic and the results pertinent to this consultation showed:  06/23/20: MRI Brain: Motion degraded and prematurely terminated examination as described. No evidence of acute infarction. Redemonstrated chronic cortically based right frontal lobe MCA territory infarct. Background mild generalized parenchymal atrophy and chronic small vessel ischemic disease. Chronic right maxillary sinusitis.  06/12/20 MRI brain: 1. Incomplete and limited study due to patient inability to lie still in the scanner for the duration of the study. 2. No large acute territorial infarct identified in  this very limited study. 3. Chronic infarct in the right  frontal lobe.  Assessment: 79 yo female with PMHx significant for dementia, bipolar disorder, seizures, depression, fibromyalgia, diabetic neuropathy, chronic respiratory failure, Stage III CKD, morbid obesity, recent LV thrombus with AC x 3 mos. She was admitted here from 12/9 to 06/17/20 for delirium/encephalopathy thought to be due to UTI and worsening dementia. She was discharged to SNF. Came back to ED 12/19 for acute encephalopathy. Had Geodon 06/22/20 for agitation. No anti anxiety on 06/23/20. Was loaded with I gm IV Keppra in ED. Neuro was consulted 06/22/20 for CODE STROKE. No findings of acute stroke. Ammonia level was high at 71 down to 25 today after Lactulose. Pt being treated empirically for PNA.   Impression: 1. Toxic metabolic encephalopathy-likely due to elevated ammonia levels, BNP 1752, and slight bump in creatinine in face of CKD III. UA normal but culture pending.  2. Delirium-pt's environment has changed multiple times lately from home to hospital to SNF and back to hospital. 3. Dementia-worsening per daughter. Also, a likely contributor to mental status.  4. MRI shows motion degradation. + old frontal stroke. No acute findings.  5. EEG is suggestive of moderate diffuse encephalopathy, non specific. No seizures or epileptiform discharges noted.    Recommendations: -As pt had been agitated, will change Keppra to Depakote (IV for now). Please switch to Depakote 534m po bid when swallowing. This may aide her mood as well.  -Continue treatment of found acute events.  -Check Vit B12 level-ordered. -No further testing per neuro. We will be available for questions. Call for worsening AMS or noted sz activity.  -Delirium precautions.  -Seizure precautions.  -Fall precautions.    Pt seen by KClance Boll MSN, APN-BC/Nurse Practitioner/Neuro and later by MD. Note and plan to be edited as needed by MD.  Pager:  [phone]

## 2020-06-23 NOTE — Progress Notes (Signed)
PROGRESS NOTE    DENESHA BROUSE  VFI:[id number] DOB: [birth year] DOA: 06/22/2020 PCP: [name], [name], MD      Brief Narrative:  Mrs. Lacap is a 79 y.o. F with dementia, home dwelling, SLE not on DMARD, dCHF and COPD on 3L home O2, CAD, obesity, OSA not on CPAP, HTN, Bipolar, hypothyroidism, history stroke, DM, seizures, CKD IIIb baseline 1.5 and recent new diagnosis apical thrombus and atrial fibrillation on anticoagulation who presents with confusion.  Discharged one week ago for encephalopathy thought to be from UTI in setting of dementia.  Treated with fluids and Rocephin and discharged to SNF.  During this hospital stay, apixaban was restarted due to new onset Afib.  Also, Torsemide was stopped due to dehydration and poor oral intake.  Since going to SNF, patient gradually worsened, more somnolent, sluggish until the day of admission when this was severe, so EMS were activated.  In the ER, CT head unremarkable.  Cr at baseline.  Ammonia elevated.  ABG normal.  COVID negative.  BNP very elevated and CXR with edema.  CTA chest ruled out PE, confirmed edema, and also showed new lung mass.         Assessment & Plan:  Acute metabolic encephalopathy Multifactorial in setting of dementia.    Acute on chronic diastolic CHF Recent echo showed EF 55-60% -Furosemide 40 mg IV twice a day  -K supplement -Strict I/Os, daily weights, telemetry  -Daily monitoring renal function  Pneumonia P/w tachycardia due to Afib and encephalopathy due to multiple other factors.   -Continue azithromyin and ceftriaxone -Flutter valve -Incentive spirometry  Hypernatremia This will need careful monitoring during diuresis   Lung mass -Consult Pulmonology  Hyperammonemia I suspect this is congestive hepatopathy, no history cirrhosis  Lactulose enema given once -Diuresis and repeat Ammonia  COPD with chronic hypoxic respiratory failure  Paroxysmal atrial fibrillation -Continue  apixaban   Coronary disease and cerebrovascular disease, secondary prevention Hypertension -Continue metoprolol -Continue Imdur -Continue atorvastatin  Hypothyroidism  Diabetes -Continue SS correction insulin -Continue home Lantus   CKD stage IIIb  Transaminitis  COPD -Continue ICS LABA -Continue Singulair  Abnormal thyroid  Lupus  Bipolar disorder -Hold Duloxetine until mentation improves  Dementia -Hold Namenda   History of seizures -Continue Keppra and Depakote per Neurology -Continue gabapentin   Gout -Continue allopurinol           Disposition: Status is: Inpatient  Remains inpatient appropriate because:IV treatments appropriate due to intensity of illness or inability to take PO   Dispo: The patient is from: Home              Anticipated d/c is to: SNF              Anticipated d/c date is: 3 days              Patient currently is not medically stable to d/c.              MDM: This is a no charge note.  For further details, please see H&P by my partner Dr. Marlowe Sax from earlier today.  The below labs and imaging reports were reviewed and summarized above.    DVT prophylaxis: apixaban (ELIQUIS) tablet 2.5 mg Start: 06/23/20 2200Apixaban apixaban (ELIQUIS) tablet 2.5 mg  Code Status: DNR Family Communication: Daughter by phone    Consultants:     Procedures:     Antimicrobials:      Culture data:  Subjective: Sleepy.  Does not make meaningful responses to questions. No respiratory distress.        Objective: Vitals:   06/23/20 0900 06/23/20 0930 06/23/20 1345 06/23/20 1445  BP: (!) 165/132 (!) 152/112 (!) 186/89 (!) 188/94  Pulse:  (!) 113 86 83  Resp: (!) 24 17 (!) 25 (!) 23  Temp:      TempSrc:      SpO2:  100% 97% 99%    Intake/Output Summary (Last 24 hours) at 06/23/2020 1709 Last data filed at 06/23/2020 1116 Gross per 24 hour  Intake 392.62 ml  Output --  Net 392.62 ml    There were no vitals filed for this visit.  Examination: The patient was seen and examined.      Data Reviewed: I have personally reviewed following labs and imaging studies:  CBC: Recent Labs  Lab 06/22/20 1913 06/22/20 2029 06/22/20 2035 06/22/20 2111 06/23/20 0921  WBC 13.0* 12.9*  --   --  11.5*  NEUTROABS  --  8.8*  --   --   --   HGB 10.5* 9.8* 11.2* 10.9* 10.2*  HCT 31.9* 29.0* 33.0* 32.0* 30.7*  MCV 78.0* 76.9*  --   --  78.5*  PLT 202 184  --   --  017   Basic Metabolic Panel: Recent Labs  Lab 06/17/20 0206 06/22/20 1913 06/22/20 2035 06/22/20 2111 06/23/20 0921  NA 145 142 147* 147* 144  K 3.6 4.0 4.0 3.7 4.0  CL 116* 112* 114*  --  110  CO2 20* 20*  --   --  24  GLUCOSE 80 262* 220*  --  172*  BUN 28* 15 16  --  15  CREATININE 1.64* 1.64* 1.40*  --  1.69*  CALCIUM 9.3 9.8  --   --  9.5   GFR: Estimated Creatinine Clearance: 35.5 mL/min (A) (by C-G formula based on SCr of 1.69 mg/dL (H)). Liver Function Tests: Recent Labs  Lab 06/22/20 1913 06/23/20 0921  AST 56* 48*  ALT 46* 43  ALKPHOS 94 89  BILITOT 1.2 0.9  PROT 7.2 6.8  ALBUMIN 3.1* 3.0*   No results for input(s): LIPASE, AMYLASE in the last 168 hours. Recent Labs  Lab 06/22/20 2231 06/23/20 0921  AMMONIA 71* 25   Coagulation Profile: Recent Labs  Lab 06/22/20 2029  INR 1.5*   Cardiac Enzymes: No results for input(s): CKTOTAL, CKMB, CKMBINDEX, TROPONINI in the last 168 hours. BNP (last 3 results) No results for input(s): PROBNP in the last 8760 hours. HbA1C: No results for input(s): HGBA1C in the last 72 hours. CBG: Recent Labs  Lab 06/17/20 0625 06/22/20 1945 06/23/20 0517 06/23/20 0836 06/23/20 1134  GLUCAP 74 205* 136* 144* 149*   Lipid Profile: No results for input(s): CHOL, HDL, LDLCALC, TRIG, CHOLHDL, LDLDIRECT in the last 72 hours. Thyroid Function Tests: Recent Labs    06/22/20 2231  TSH 0.840   Anemia Panel: No results for input(s): VITAMINB12,  FOLATE, FERRITIN, TIBC, IRON, RETICCTPCT in the last 72 hours. Urine analysis:    Component Value Date/Time   COLORURINE AMBER (A) 06/23/2020 0055   APPEARANCEUR HAZY (A) 06/23/2020 0055   LABSPEC >1.046 (H) 06/23/2020 0055   PHURINE 5.0 06/23/2020 0055   GLUCOSEU NEGATIVE 06/23/2020 0055   HGBUR MODERATE (A) 06/23/2020 0055   HGBUR negative 02/26/2009 1314   BILIRUBINUR NEGATIVE 06/23/2020 0055   BILIRUBINUR NEG 04/05/2014 [place] 06/23/2020 0055   PROTEINUR >=300 (A) 06/23/2020 0055  UROBILINOGEN 0.2 08/01/2014 2305   NITRITE NEGATIVE 06/23/2020 0055   LEUKOCYTESUR NEGATIVE 06/23/2020 0055   Sepsis Labs: @LABRCNTIP (procalcitonin:4,lacticacidven:4)  ) Recent Results (from the past 240 hour(s))  SARS Coronavirus 2 by RT PCR (hospital order, performed in [facility] hospital lab) Nasopharyngeal Nasopharyngeal Swab     Status: None   Collection Time: 06/16/20  3:20 PM   Specimen: Nasopharyngeal Swab  Result Value Ref Range Status   SARS Coronavirus 2 NEGATIVE NEGATIVE Final    Comment: (NOTE) SARS-CoV-2 target nucleic acids are NOT DETECTED.  The SARS-CoV-2 RNA is generally detectable in upper and lower respiratory specimens during the acute phase of infection. The lowest concentration of SARS-CoV-2 viral copies this assay can detect is 250 copies / mL. A negative result does not preclude SARS-CoV-2 infection and should not be used as the sole basis for treatment or other patient management decisions.  A negative result may occur with improper specimen collection / handling, submission of specimen other than nasopharyngeal swab, presence of viral mutation(s) within the areas targeted by this assay, and inadequate number of viral copies (<250 copies / mL). A negative result must be combined with clinical observations, patient history, and epidemiological information.  Fact Sheet for Patients:   StrictlyIdeas.no  Fact Sheet for  Healthcare Providers: BankingDealers.co.za  This test is not yet approved or  cleared by the [place] FDA and has been authorized for detection and/or diagnosis of SARS-CoV-2 by FDA under an Emergency Use Authorization (EUA).  This EUA will remain in effect (meaning this test can be used) for the duration of the COVID-19 declaration under Section 564(b)(1) of the Act, 21 U.S.C. section 360bbb-3(b)(1), unless the authorization is terminated or revoked sooner.  Performed at [facility], [place] [address] [place], [place] [address]   Resp Panel by RT-PCR (Flu A&B, Covid) Nasopharyngeal Swab     Status: None   Collection Time: 06/22/20 10:56 PM   Specimen: Nasopharyngeal Swab; Nasopharyngeal(NP) swabs in vial transport medium  Result Value Ref Range Status   SARS Coronavirus 2 by RT PCR NEGATIVE NEGATIVE Final    Comment: (NOTE) SARS-CoV-2 target nucleic acids are NOT DETECTED.  The SARS-CoV-2 RNA is generally detectable in upper respiratory specimens during the acute phase of infection. The lowest concentration of SARS-CoV-2 viral copies this assay can detect is 138 copies/mL. A negative result does not preclude SARS-Cov-2 infection and should not be used as the sole basis for treatment or other patient management decisions. A negative result may occur with  improper specimen collection/handling, submission of specimen other than nasopharyngeal swab, presence of viral mutation(s) within the areas targeted by this assay, and inadequate number of viral copies(<138 copies/mL). A negative result must be combined with clinical observations, patient history, and epidemiological information. The expected result is Negative.  Fact Sheet for Patients:  EntrepreneurPulse.com.au  Fact Sheet for Healthcare Providers:  IncredibleEmployment.be  This test is no t yet approved or cleared by the [place] FDA and  has been  authorized for detection and/or diagnosis of SARS-CoV-2 by FDA under an Emergency Use Authorization (EUA). This EUA will remain  in effect (meaning this test can be used) for the duration of the COVID-19 declaration under Section 564(b)(1) of the Act, 21 U.S.C.section 360bbb-3(b)(1), unless the authorization is terminated  or revoked sooner.       Influenza A by PCR NEGATIVE NEGATIVE Final   Influenza B by PCR NEGATIVE NEGATIVE Final    Comment: (NOTE) The Xpert Xpress SARS-CoV-2/FLU/RSV plus  assay is intended as an aid in the diagnosis of influenza from Nasopharyngeal swab specimens and should not be used as a sole basis for treatment. Nasal washings and aspirates are unacceptable for Xpert Xpress SARS-CoV-2/FLU/RSV testing.  Fact Sheet for Patients: EntrepreneurPulse.com.au  Fact Sheet for Healthcare Providers: IncredibleEmployment.be  This test is not yet approved or cleared by the [place] FDA and has been authorized for detection and/or diagnosis of SARS-CoV-2 by FDA under an Emergency Use Authorization (EUA). This EUA will remain in effect (meaning this test can be used) for the duration of the COVID-19 declaration under Section 564(b)(1) of the Act, 21 U.S.C. section 360bbb-3(b)(1), unless the authorization is terminated or revoked.  Performed at [facility], [place] [address], [place], [place] [address]          Radiology Studies: EEG  Result Date: 06/23/2020 Lora Havens, MD     06/23/2020  1:43 PM Patient Name: AYLEAH HOFMEISTER MRN: [id number] Epilepsy Attending: Lora Havens Referring Physician/Provider: Dr Derrick Ravel Date: 06/23/2020 Duration: 27.45 mins Patient history: 79 year old female with a history of atrial fibrillation on Eliquis, seizure x 1, recent UTI with AMS, re-presenting with altered mental status and left sided weakness. EEG to evaluate for seizure. Level of alertness: Awake AEDs during EEG  study: LEv Technical aspects: This EEG study was done with scalp electrodes positioned according to the 10-20 International system of electrode placement. Electrical activity was acquired at a sampling rate of 500Hz  and reviewed with a high frequency filter of 70Hz  and a low frequency filter of 1Hz . EEG data were recorded continuously and digitally stored. Description: No posterior dominant rhythm was seen. EEG showed continuous generalized 3 to 6 Hz theta-delta slowing.  Hyperventilation and photic stimulation were not performed.   ABNORMALITY -Continuous slow, generalized IMPRESSION: This study is suggestive of moderate diffuse encephalopathy, nonspecific etiology. No seizures or epileptiform discharges were seen throughout the recording. Lora Havens    CT Angio Chest PE W and/or Wo Contrast  Result Date: 06/23/2020 CLINICAL DATA:  Altered mental status. EXAM: CT ANGIOGRAPHY CHEST WITH CONTRAST TECHNIQUE: Multidetector CT imaging of the chest was performed using the standard protocol during bolus administration of intravenous contrast. Multiplanar CT image reconstructions and MIPs were obtained to evaluate the vascular anatomy. CONTRAST:  19mL OMNIPAQUE IOHEXOL 350 MG/ML SOLN COMPARISON:  January 13, 2017 FINDINGS: Cardiovascular: There is marked severity calcification of the aortic arch. Satisfactory opacification of the pulmonary arteries to the segmental level. No evidence of pulmonary embolism. There is mild cardiomegaly with marked severity coronary artery calcification. No pericardial effusion. Mediastinum/Nodes: Mild pretracheal and subcarinal lymphadenopathy is seen. Marked severity narrowing of the origin of the right main bronchus is noted (axial CT image 31, CT series number 6). An ill-defined right hilar soft tissue mass is seen which measures approximately 2.4 cm x 1.8 cm. Adjacent right suprahilar peribronchial thickening is also present (approximately 7.5 mm in thickness). Stable marked  severity enlargement of the left lobe of the thyroid gland is seen. The esophagus demonstrates no significant findings. Lungs/Pleura: Mild atelectasis and/or infiltrate is seen within the right apex, posterior aspect of the right upper lobe and bilateral lung bases. Small bilateral pleural effusions are noted. No pneumothorax is identified. Upper Abdomen: Surgical clips are seen within the gallbladder fossa. Musculoskeletal: No chest wall abnormality. No acute or significant osseous findings. Review of the MIP images confirms the above findings. IMPRESSION: 1. No evidence of pulmonary embolism. 2. Ill-defined right hilar  soft tissue mass with adjacent right suprahilar peribronchial thickening, concerning for the presence of a primary lung malignancy. 3. Mild right upper lobe and bibasilar atelectasis and/or infiltrate. 4. Small bilateral pleural effusions. 5. Stable marked severity enlargement of the left lobe of the thyroid gland. 6. Aortic atherosclerosis. Aortic Atherosclerosis (ICD10-I70.0). Electronically Signed   By: Virgina Norfolk M.D.   On: 06/23/2020 01:10   MR BRAIN WO CONTRAST  Result Date: 06/23/2020 CLINICAL DATA:  Delirium. Additional history provided: Altered mental status. EXAM: MRI HEAD WITHOUT CONTRAST TECHNIQUE: Multiplanar, multiecho pulse sequences of the brain and surrounding structures were obtained without intravenous contrast. COMPARISON:  Noncontrast head CT 06/22/2020.  Brain MRI 06/12/2020. FINDINGS: Brain: Due to altered mental status, the patient was unable to tolerate the full examination. Only axial and coronal diffusion-weighted sequences, a sagittal T1 weighted sequence, an axial T2 weighted sequence and an axial T2/FLAIR sequence could be obtained. The acquired sequences are motion degraded. Most notably, the sagittal T1 weighted sequence is moderate to severely motion degraded. Mild cerebral and cerebellar atrophy. No evidence of acute infarction. Redemonstrated chronic  cortically based infarct within the mid to anterior right frontal lobe. Background mild multifocal T2/FLAIR hyperintensity within the cerebral white matter which is nonspecific, but compatible with chronic small vessel ischemic disease. No evidence of intracranial mass. No extra-axial fluid collection. No midline shift. Partially empty sella turcica. Vascular: Expected proximal arterial flow voids. Skull and upper cervical spine: Within described limitations, no focal marrow lesion is identified. Sinuses/Orbits: Visualized orbits show no acute finding. Mild mucosal thickening within the right maxillary sinus with associated prominent chronic reactive osteitis. IMPRESSION: Motion degraded and prematurely terminated examination as described. No evidence of acute infarction. Redemonstrated chronic cortically based right frontal lobe MCA territory infarct. Background mild generalized parenchymal atrophy and chronic small vessel ischemic disease. Chronic right maxillary sinusitis. Electronically Signed   By: Kellie Simmering DO   On: 06/23/2020 07:56   CT ABDOMEN PELVIS W CONTRAST  Result Date: 06/23/2020 CLINICAL DATA:  Altered mental status. EXAM: CT ABDOMEN AND PELVIS WITH CONTRAST TECHNIQUE: Multidetector CT imaging of the abdomen and pelvis was performed using the standard protocol following bolus administration of intravenous contrast. CONTRAST:  109mL OMNIPAQUE IOHEXOL 350 MG/ML SOLN COMPARISON:  December 05, 2019 FINDINGS: The study is limited secondary to patient motion. Lower chest: Mild atelectasis and/or infiltrate is seen within the bilateral lung bases. Small bilateral pleural effusions are noted. Hepatobiliary: No focal liver abnormality is seen. Status post cholecystectomy. No biliary dilatation. Pancreas: Unremarkable. No pancreatic ductal dilatation or surrounding inflammatory changes. Spleen: Chronic curvilinear calcification is seen along the periphery of the spleen. Adrenals/Urinary Tract: Adrenal glands  are unremarkable. Kidneys are normal in size, without renal calculi or hydronephrosis. Multiple stable bilateral simple renal cysts are seen. Bladder is unremarkable. Stomach/Bowel: Stomach is within normal limits. The appendix is not clearly identified. No evidence of bowel wall thickening, distention, or inflammatory changes. Noninflamed diverticula are seen within the sigmoid colon. Vascular/Lymphatic: There is marked severity aortic calcification and atherosclerosis. No enlarged abdominal or pelvic lymph nodes. Reproductive: Stable calcified uterine fibroids are present. The bilateral adnexa are unremarkable. Other: No abdominal wall hernia or abnormality. No abdominopelvic ascites. Musculoskeletal: Degenerative changes seen throughout the lumbar spine. IMPRESSION: 1. Mild atelectasis and/or infiltrate within the bilateral lung bases with small bilateral pleural effusions. 2. Sigmoid diverticulosis. 3. Stable bilateral simple renal cysts. 4. Calcified uterine fibroids. 5. Aortic atherosclerosis. Aortic Atherosclerosis (ICD10-I70.0). Electronically Signed   By: Joyce Gross.D.  On: 06/23/2020 01:15   DG ABD ACUTE 2+V W 1V CHEST  Result Date: 06/22/2020 CLINICAL DATA:  Altered level of consciousness EXAM: DG ABDOMEN ACUTE WITH 1 VIEW CHEST COMPARISON:  06/07/2020 FINDINGS: Supine and upright frontal views of the abdomen as well as an upright frontal view of the chest are obtained. The cardiac silhouette is enlarged but stable. Increased central vascular congestion and diffuse interstitial prominence, with developing consolidation at the lung bases. Small left effusion. No pneumothorax. Bowel gas pattern is unremarkable without obstruction or ileus. No masses or abnormal calcifications. No free gas in the greater peritoneal sac. IMPRESSION: 1. Worsening volume status, with developing pulmonary edema. 2. Unremarkable bowel gas pattern. Electronically Signed   By: Randa Ngo M.D.   On: 06/22/2020  23:21   US THYROID  Result Date: 06/23/2020 CLINICAL DATA:  Incidental on CT. 79 year old female presenting with altered mental status and enlarged left thyroid noted on chest CT. History of left thyroid biopsy on 07/31/2014. EXAM: THYROID ULTRASOUND TECHNIQUE: Ultrasound examination of the thyroid gland and adjacent soft tissues was performed. COMPARISON:  07/16/2014, 07/31/2014, chest CT from 06/23/2020 FINDINGS: Parenchymal Echotexture: Markedly heterogenous Isthmus: 0.6 cm, previously 0.6 cm Right lobe: 4.7 x 2.0 x 1.8 cm, previously 5.4 x 2.1 x 1.6 cm Left lobe: 7.5 x 4.3 x 6.9 cm, previously 9.0 x 4.7 x 5.4 cm _________________________________________________________ Estimated total number of nodules >/= 1 cm: 0 Number of spongiform nodules >/=  2 cm not described below (TR1): 0 Number of mixed cystic and solid nodules >/= 1.5 cm not described below ([place]): 0 _________________________________________________________ No discrete nodules are seen within the thyroid gland. Per sonographer note, this was a limited exam due to patient's mental status. IMPRESSION: Similar appearing diffusely heterogeneous thyroid gland. Asymmetrically enlarged left lobe without discrete nodule. Recommend correlation with prior biopsy results. Per sonographer note, this was a limited exam due to patient's mental status. The above is in keeping with the ACR TI-RADS recommendations - J Am Coll Radiol 2017;14:587-595. Ruthann Cancer, MD [facility] Electronically Signed   By: Ruthann Cancer MD   On: 06/23/2020 08:00   CT HEAD CODE STROKE WO CONTRAST  Result Date: 06/22/2020 CLINICAL DATA:  Code stroke. Initial evaluation for neuro deficit, stroke suspected. EXAM: CT HEAD WITHOUT CONTRAST TECHNIQUE: Contiguous axial images were obtained from the base of the skull through the vertex without intravenous contrast. COMPARISON:  Prior MRI from 06/12/2020 FINDINGS: Brain:  Examination mildly degraded by motion artifact. Age-related cerebral atrophy with chronic microvascular ischemic disease. Chronic right MCA distribution infarct involving the anterior right frontal lobe. No acute intracranial hemorrhage. No acute large vessel territory infarct. No mass lesion, midline shift or mass effect. No hydrocephalus or extra-axial fluid collection. Vascular: No hyperdense vessel. Scattered vascular calcifications noted within the carotid siphons. Skull: Scalp soft tissues and calvarium within normal limits. Sinuses/Orbits: Right gaze noted. Prior bilateral ocular lens replacement. Chronic right maxillary sinusitis. Mastoid air cells are clear. Other: None. ASPECTS [facility] Stroke Program Early CT Score) - Ganglionic level infarction (caudate, lentiform nuclei, internal capsule, insula, M1-M3 cortex): 7 - Supraganglionic infarction (M4-M6 cortex): 3 Total score (0-10 with 10 being normal): 10 IMPRESSION: 1. No acute intracranial infarct or other abnormality. 2. ASPECTS is 10. 3. Chronic right MCA distribution infarct. 4. Underlying atrophy with mild chronic small vessel ischemic disease. 5. Chronic right maxillary sinusitis. These results were communicated to Dr. Cheral Marker at 8:27 [place] 12/19/2021by text page via the [facility] messaging system.  Electronically Signed   By: Jeannine Boga M.D.   On: 06/22/2020 20:28   US Abdomen Limited RUQ (LIVER/GB)  Result Date: 06/23/2020 CLINICAL DATA:  Elevated LFTs.  Cholecystectomy. EXAM: ULTRASOUND ABDOMEN LIMITED RIGHT UPPER QUADRANT COMPARISON:  CT 06/23/2020. FINDINGS: Gallbladder: Cholecystectomy. Common bile duct: Diameter: 9.2 mm. Mild common bile duct dilatation can be seen following cholecystectomy. Liver: Mild increased echogenicity suggesting fatty infiltration cannot be excluded. No focal hepatic abnormality identified. Portal vein is patent on color Doppler imaging with normal direction of blood flow towards the liver. Other: Multiple  renal cysts with thin septation again noted with the largest measuring 9.6 cm. Mild amount of debris may be within the cyst. The cysts are most likely benign. Similar findings noted on prior CT. IMPRESSION: 1. Cholecystectomy. Common bile duct measures 9.2 mm. Mild prominence of the common bile duct can be seen following cholecystectomy. Further evaluation of common bile duct dilatation can be performed with MRCP as needed. No intrahepatic biliary ductal dilatation. 2. Mild increased hepatic echogenicity suggesting fatty infiltration. No focal hepatic abnormality identified. Electronically Signed   By: [place]   On: 06/23/2020 07:24        Scheduled Meds: . allopurinol  200 mg Oral Daily  . apixaban  2.5 mg Oral BID  . atorvastatin  20 mg Oral QPM  . furosemide  40 mg Intravenous BID  . gabapentin  100 mg Oral QHS  . insulin aspart  0-9 Units Subcutaneous Q4H  . insulin glargine  14 Units Subcutaneous QHS  . isosorbide mononitrate  30 mg Oral Daily  . metoprolol succinate  50 mg Oral Daily  . mometasone-formoterol  2 puff Inhalation BID  . montelukast  10 mg Oral [facility]   Continuous Infusions: . azithromycin Stopped (06/23/20 1028)  . cefTRIAXone (ROCEPHIN)  IV Stopped (06/23/20 0845)  . valproate sodium       LOS: 0 days    Time spent: 20 minutes    Edwin Dada, MD [facility] 06/23/2020, 5:09 PM     Please page though [place] or Epic secure chat:  For password, contact charge nurse

## 2020-06-23 NOTE — ED Notes (Signed)
Pt very hard stick. Asked phlebotomist for assistance.

## 2020-06-23 NOTE — ED Notes (Signed)
Tried to call report to 3E. Said they would call back in 5 minutes.

## 2020-06-23 NOTE — ED Notes (Addendum)
Pt got very anxious and moved a lot when BP was taken.

## 2020-06-23 NOTE — ED Notes (Signed)
Phlebotomist at bedside.

## 2020-06-23 NOTE — ED Notes (Signed)
This RN and ED Tech attempting to clean patient and place patient back on heart monitor. Pt being physically aggressive at this time. Pt not allowing RN and ED Tech to clean her or place her back on the heart monitor. This RN and Tech attempted to deescalate patient but unsuccessful. Pt placed on BP cuff and SpO2 monitor at this time. Will continue to reevaluate patient.

## 2020-06-23 NOTE — ED Notes (Signed)
PT taken to [place].  PT will go to MRI from [place].

## 2020-06-23 NOTE — Progress Notes (Deleted)
Subjective:   Brittany Roberts, female    DOB: Jun 04, 1941, 79 y.o.   MRN: [id number]   Chief complaint:  Chronic systolic heart failure  79 year-old African-American female with nonischemic cardiomyopathy, CAD (PCI RCA), obesity, hypertension, type II diabetes mellitus, morbid obesity, peripheral neuropathy, lupus, now with acute on chronic systolic heart failure, LV apical thrombus.  Patient is here with her daughter.  She is doing fairly well without any worsening exertional dyspnea, leg edema symptoms.  She is on stable supplemental oxygen at home.  She is considering moving into a nursing facility.  Today, she confirmed her wishes not to be resuscitated, in case of cardiac arrest.  ***Patient presents for follow up after hospitalization 06/07/2020-06/17/2020.  During hospitalization patient with new onset atrial fibrillation, she was started on Eliquis 2.5 mg twice daily and metoprolol succinate 50 mg daily.  ***She was also recently evaluated in the emergency department on 06/13/2020 with altered mental status.   Current Facility-Administered Medications on File Prior to Visit  Medication Dose Route Frequency Provider Last Rate Last Admin  . allopurinol (ZYLOPRIM) tablet 200 mg  200 mg Oral Daily Danford, Suann Larry, MD      . apixaban (ELIQUIS) tablet 2.5 mg  2.5 mg Oral BID Danford, Suann Larry, MD      . atorvastatin (LIPITOR) tablet 20 mg  20 mg Oral QPM Danford, Suann Larry, MD      . azithromycin (ZITHROMAX) 500 mg in sodium chloride 0.9 % 250 mL IVPB  500 mg Intravenous Q24H Shela Leff, MD   Stopped at 06/23/20 1028  . cefTRIAXone (ROCEPHIN) 1 g in sodium chloride 0.9 % 100 mL IVPB  1 g Intravenous Q24H Shela Leff, MD   Stopped at 06/23/20 0845  . furosemide (LASIX) injection 40 mg  40 mg Intravenous BID Edwin Dada, MD   40 mg at 06/23/20 0901  . gabapentin (NEURONTIN) capsule 100 mg  100 mg Oral QHS Danford, Christopher P, MD      . insulin  aspart (novoLOG) injection 0-9 Units  0-9 Units Subcutaneous Q4H Shela Leff, MD   1 Units at 06/23/20 0830  . insulin glargine (LANTUS) injection 14 Units  14 Units Subcutaneous QHS Danford, Christopher P, MD      . ipratropium-albuterol (DUONEB) 0.5-2.5 (3) MG/3ML nebulizer solution 3 mL  3 mL Nebulization Q6H PRN Shela Leff, MD      . isosorbide mononitrate (IMDUR) 24 hr tablet 30 mg  30 mg Oral Daily Danford, Suann Larry, MD      . metoprolol succinate (TOPROL-XL) 24 hr tablet 50 mg  50 mg Oral Daily Danford, Suann Larry, MD      . mometasone-formoterol (DULERA) 200-5 MCG/ACT inhaler 2 puff  2 puff Inhalation BID Danford, Suann Larry, MD      . montelukast (SINGULAIR) tablet 10 mg  10 mg Oral QHS Danford, Suann Larry, MD      . valproate (DEPACON) 250 mg in dextrose 5 % 50 mL IVPB  250 mg Intravenous Q6H Kirby-Graham, Karsten Fells, NP       Current Outpatient Medications on File Prior to Visit  Medication Sig Dispense Refill  . Accu-Chek Softclix Lancets lancets Use to test blood sugars 1-2 times daily.Dx:e11.9 100 each 12  . acetaminophen (TYLENOL) 500 MG tablet Take 500 mg by mouth every 6 (six) hours as needed for moderate pain.    Marland Kitchen allopurinol (ZYLOPRIM) 100 MG tablet Take 2 tablets (200 mg total) by mouth daily. 180 tablet  1  . apixaban (ELIQUIS) 2.5 MG TABS tablet Take 1 tablet (2.5 mg total) by mouth 2 (two) times daily. 60 tablet   . Ascorbic Acid (VITAMIN C) 500 MG CAPS Take 1 capsule by mouth daily. (Patient taking differently: Take 500 mg by mouth daily.) 30 capsule 0  . atorvastatin (LIPITOR) 20 MG tablet TAKE 1 TABLET(20 MG) BY MOUTH DAILY (Patient taking differently: Take 20 mg by mouth every evening.) 90 tablet 0  . B Complex Vitamins (VITAMIN B COMPLEX) TABS Take 1 tablet by mouth daily.    . Blood Glucose Monitoring Suppl (ACCU-CHEK GUIDE) w/Device KIT Use to test blood sugars 1-2 times daily. Dx:e11.9 1 kit 1  . Cholecalciferol (VITAMIN D3) 125 MCG (5000  UT) TABS Take 5,000 Units by mouth daily.    . divalproex (DEPAKOTE) 125 MG DR tablet Take 125 mg by mouth 2 (two) times daily.    . DULoxetine (CYMBALTA) 60 MG capsule Take 1 capsule (60 mg total) by mouth daily. 90 capsule 2  . ferrous sulfate 325 (65 FE) MG tablet Take 325 mg by mouth 2 (two) times daily with a meal.     . Fluocinolone Acetonide Scalp 0.01 % OIL Apply 1 application topically at bedtime as needed (itching scalp).     . Fluticasone-Salmeterol (ADVAIR) 250-50 MCG/DOSE AEPB Inhale 1 puff into the lungs 2 (two) times daily.    Marland Kitchen gabapentin (NEURONTIN) 100 MG capsule Take 1 capsule (100 mg total) by mouth at bedtime. (Patient taking differently: Take 100 mg by mouth at bedtime. For muscle spasms)    . glucose blood (ACCU-CHEK GUIDE) test strip Use to test blood sugars 1-2 times daily. Dx: e11.9 100 each 12  . insulin glargine (LANTUS) 100 UNIT/ML injection Inject 0.14 mLs (14 Units total) into the skin daily. (Patient taking differently: Inject 14 Units into the skin at bedtime.) 10 mL 11  . Insulin Pen Needle (B-D ULTRAFINE III SHORT PEN) 31G X 8 MM MISC CHECK BLOOD SUGAR 3 TIMES A DAY 100 each 3  . isosorbide mononitrate (IMDUR) 30 MG 24 hr tablet TAKE 1 TABLET(30 MG) BY MOUTH DAILY (Patient taking differently: Take 30 mg by mouth daily.) 90 tablet 3  . levETIRAcetam (KEPPRA) 500 MG tablet Take 1 tablet (500 mg total) by mouth 2 (two) times daily. 180 tablet 3  . melatonin 3 MG TABS tablet Take 1 tablet (3 mg total) by mouth at bedtime. (Patient not taking: Reported on 06/23/2020)  0  . melatonin 5 MG TABS Take 10 mg by mouth at bedtime.    . memantine (NAMENDA) 10 MG tablet Take 10 mg by mouth daily.    . metoprolol succinate (TOPROL-XL) 50 MG 24 hr tablet TAKE 1 TABLET BY MOUTH EVERY DAY. TAKE WITH OR IMMEDIATELY FOLLOWING A MEAL (Patient taking differently: Take 50 mg by mouth daily.) 90 tablet 1  . montelukast (SINGULAIR) 10 MG tablet TAKE 1 TABLET(10 MG) BY MOUTH AT BEDTIME  (Patient taking differently: Take 10 mg by mouth at bedtime.) 90 tablet 3  . Omega-3 Fatty Acids (FISH OIL) 1000 MG CAPS Take 1,000 mg by mouth daily.     . pantoprazole (PROTONIX) 40 MG tablet TAKE 1 TABLET(40 MG) BY MOUTH DAILY (Patient taking differently: Take 40 mg by mouth daily.) 90 tablet 3  . Polyethyl Glycol-Propyl Glycol (SYSTANE) 0.4-0.3 % GEL ophthalmic gel Place 1 application into both eyes every 4 (four) hours as needed (dry eyes).    Marland Kitchen PROAIR HFA 108 (90 Base) MCG/ACT inhaler  INHALE 2 PUFF BY MOUTH EVERY 4 HOURS AS NEEDED USE ONLY IF YOU ARE WHEEZING (Patient taking differently: Inhale 2 puffs into the lungs every 4 (four) hours as needed for wheezing or shortness of breath.) 8.5 g 3  . Spacer/Aero-Holding Chambers (AEROCHAMBER PLUS) inhaler Use as instructed to use with inahaler. 1 each 1  . SYMBICORT 80-4.5 MCG/ACT inhaler INHALE 2 PUFFS INTO THE LUNGS TWICE DAILY (Patient not taking: Reported on 06/23/2020) 10.2 g 3    Cardiovascular studies:  Echocardiogram 06/08/2020: 1. Indeterminate diastolic function due to MAC. Left ventricular ejection fraction, by estimation, is 55 to 60%. The left ventricle has normal function. The left ventricle has no regional wall motion abnormalities. There is moderate left ventricular hypertrophy. Left ventricular diastolic parameters are indeterminate.  2. Right ventricular systolic function is normal. The right ventricular size is normal.  3. Left atrial size was severely dilated.  4. Grossly normal in size.  5. The mitral valve is degenerative. No evidence of mitral valve regurgitation. No evidence of mitral stenosis. Moderate to severe mitral annular calcification.  6. The aortic valve is calcified. Aortic valve regurgitation is not visualized. Mild aortic valve sclerosis is present, with no evidence of aortic valve stenosis.  7. The inferior vena cava is normal in size with greater than 50% respiratory variability, suggesting right atrial  pressure of 3 mmHg. Compared to prior study in June 2021, possible apical thrombus and apical akinesis not noted in the present study. Now there is no wall motion abnormality.   EKG 06/07/2020: Atrial fibrillation with controlled ventricular response at a rate of 60 bpm  EKG 12/13/2019: Sinus rhythm 73 bpm. Left anterior fascicular block Low boltage Poor R wave progression  Echocardiogram 12/14/2017: - Left ventricle: The cavity size was normal. Systolic function was  mildly reduced. The estimated ejection fraction was in the range  of 45% to 50%. Akinesis of the inferior myocardium. Features are  consistent with a pseudonormal left ventricular filling pattern,  with concomitant abnormal relaxation and increased filling  pressure (grade 2 diastolic dysfunction). - Mitral valve: Mildly thickened leaflets . Chordal calcification.  There was mild regurgitation. Valve area by pressure half-time: 1.47 cm. - Left atrium: The atrium was mildly dilated. - Right atrium: The atrium was mildly dilated. - Pericardium, extracardiac: Trace pericardial effusion was  identified. - Significant improvement in LVEF compared to previous outpatient   echocardiogram in 2018.  Coronary angiography 07/01/2015: 1. Findings consistent with nonischemic cardiomyopathy, LVEF 25-30% with global hypokinesis. 2. Previously placed 2.5 mm DES in the PDA branch of the right coronary artery in 2014 is widely patent. Mid RCA has a 50% stenosis, FFR 0.84, not hemodynamically significant. 3. Diffusely diseased left coronary arteries, especially involving the distal LAD and circumflex coronary artery, may be the etiology for her angina pectoris.   Recent labs: 06/23/2020:  ***  Jul-Sep 2021: Glucose 132, BUN/Cr 50/1.8. EGFR 25.  H/H 04/10/33.6. MCV 82.8. Platelets 207 HbA1C 7.7%  12/11/2019: Glucose 255, BUN/Cr 44/1.9. EGFR 30. Na/K 137/3.8. Rest of the CMP normal H/H 10.4/32.2. MCV 77.6. Platelets  236  03/14/2019: Glucose 84, BUN/Cr 40/1.87. EGFR 31. Na/K 143/5.2. Rest of the CMP normal HbA1C 6.5% TSH 1.2 normal  07/2018: H/H 12/37. MCV 74. Platelets 239  2016: Chol 103, TG 81, HDL 38, LDL 49  Review of Systems  Cardiovascular: Negative for chest pain, dyspnea on exertion, leg swelling, palpitations and syncope.        There were no vitals filed for this visit.  Physical Exam Vitals and nursing note reviewed.  Constitutional:      General: She is not in acute distress.    Appearance: She is obese.     Comments: On supplemental oxygen  Neck:     Vascular: No JVD.  Cardiovascular:     Rate and Rhythm: Normal rate and regular rhythm.     Heart sounds: Normal heart sounds. No murmur heard.   Pulmonary:     Effort: Pulmonary effort is normal.     Breath sounds: Normal breath sounds. No wheezing or rales.        Objective:    Physical Exam Vitals and nursing note reviewed.  Constitutional:      Appearance: She is well-developed.  Neck:     Vascular: No JVD.  Cardiovascular:     Rate and Rhythm: Normal rate and regular rhythm.     Pulses: Intact distal pulses.     Heart sounds: Normal heart sounds. No murmur heard.   Pulmonary:     Effort: Pulmonary effort is normal.     Breath sounds: Normal breath sounds. No wheezing or rales.           Assessment & Recommendations:   79 year-old African-American female with nonischemic cardiomyopathy, CAD (PCI RCA), obesity, hypertension, type II diabetes mellitus, morbid obesity, peripheral neuropathy, lupus, now with acute on chronic systolic heart failure, LV apical thrombus.  ***Chronic systolic heart failure, LV apical thrombus: Clinically euvolemic. Completed 3 months anticoagulation, okay to stop now. Treatment of systolic heart failure limited due to advanced kidney disease. Continue metoprolol succinate 50 mg daily. Unable to use Bidil due to h/o allergic reaction, although she is unable to further  clarify.   ***Paroxysmal atrial fibrillation:  This patients CHA2DS2-VASc Score 7 (CHF, HTN, DM, Vasc, Age, Female) and yearly risk of stroke >9.8%.  Patient was noted to be in atrial fibrillation on presentation. No known prior history but patient did have recent apical thrombus and was treated with Coumadin by cardiology, Dr. Shanon Brow for 3 months. TSH within normal limits. Eliquis 2.5 mg p.o. twice daily. Metoprolol succinate 27m PO daily.  Outpatient follow-up with cardiology  ***Type 2 diabetes mellitus Hemoglobin A1c 8.5, not optimally controlled.  Home NPH changed to Lantus 10 units of tensely daily with better control.  Continue to monitor blood sugars closely with sliding scale as needed.  May need further adjustments.   ***F/u in 6 months  ***  ***

## 2020-06-24 DIAGNOSIS — R918 Other nonspecific abnormal finding of lung field: Secondary | ICD-10-CM

## 2020-06-24 LAB — BASIC METABOLIC PANEL
Anion gap: 10 (ref 5–15)
BUN: 16 mg/dL (ref 8–23)
CO2: 25 mmol/L (ref 22–32)
Calcium: 9.6 mg/dL (ref 8.9–10.3)
Chloride: 111 mmol/L (ref 98–111)
Creatinine, Ser: 1.68 mg/dL — ABNORMAL HIGH (ref 0.44–1.00)
GFR, Estimated: 31 mL/min — ABNORMAL LOW (ref >60)
Glucose, Bld: 173 mg/dL — ABNORMAL HIGH (ref 70–99)
Potassium: 3.6 mmol/L (ref 3.5–5.1)
Sodium: 146 mmol/L — ABNORMAL HIGH (ref 135–145)

## 2020-06-24 LAB — GLUCOSE, CAPILLARY
Glucose-Capillary: 128 mg/dL — ABNORMAL HIGH (ref 70–99)
Glucose-Capillary: 136 mg/dL — ABNORMAL HIGH (ref 70–99)
Glucose-Capillary: 145 mg/dL — ABNORMAL HIGH (ref 70–99)
Glucose-Capillary: 148 mg/dL — ABNORMAL HIGH (ref 70–99)
Glucose-Capillary: 156 mg/dL — ABNORMAL HIGH (ref 70–99)
Glucose-Capillary: 233 mg/dL — ABNORMAL HIGH (ref 70–99)

## 2020-06-24 LAB — CBC
HCT: 28.7 % — ABNORMAL LOW (ref 36.0–46.0)
Hemoglobin: 10 g/dL — ABNORMAL LOW (ref 12.0–15.0)
MCH: 26.6 pg (ref 26.0–34.0)
MCHC: 34.8 g/dL (ref 30.0–36.0)
MCV: 76.3 fL — ABNORMAL LOW (ref 80.0–100.0)
Platelets: 174 10*3/uL (ref 150–400)
RBC: 3.76 MIL/uL — ABNORMAL LOW (ref 3.87–5.11)
RDW: 16.8 % — ABNORMAL HIGH (ref 11.5–15.5)
WBC: 12.9 10*3/uL — ABNORMAL HIGH (ref 4.0–10.5)
nRBC: 1.4 % — ABNORMAL HIGH (ref 0.0–0.2)

## 2020-06-24 LAB — VITAMIN B12: Vitamin B-12: 2994 pg/mL — ABNORMAL HIGH (ref 180–914)

## 2020-06-24 MED ORDER — LEVETIRACETAM 500 MG PO TABS
500.0000 mg | ORAL_TABLET | Freq: Two times a day (BID) | ORAL | Status: DC
  Administered 2020-06-24 – 2020-06-25 (×2): 500 mg via ORAL
  Filled 2020-06-24 (×2): qty 1

## 2020-06-24 MED ORDER — SODIUM CHLORIDE 0.9 % IV SOLN
INTRAVENOUS | Status: DC | PRN
  Administered 2020-06-24 – 2020-06-25 (×2): 250 mL via INTRAVENOUS

## 2020-06-24 MED ORDER — HYDRALAZINE HCL 25 MG PO TABS
25.0000 mg | ORAL_TABLET | Freq: Four times a day (QID) | ORAL | Status: DC | PRN
  Administered 2020-06-24: 21:00:00 25 mg via ORAL
  Filled 2020-06-24: qty 1

## 2020-06-24 MED ORDER — AMLODIPINE BESYLATE 2.5 MG PO TABS
2.5000 mg | ORAL_TABLET | Freq: Every day | ORAL | Status: DC
Start: 2020-06-24 — End: 2020-06-27
  Administered 2020-06-24 – 2020-06-27 (×4): 2.5 mg via ORAL
  Filled 2020-06-24 (×4): qty 1

## 2020-06-24 NOTE — Evaluation (Addendum)
Occupational Therapy Evaluation Patient Details Name: Brittany Roberts MRN: [id number] DOB: [birth year] Today's Date: 06/24/2020    History of Present Illness 79 yo female presenting from SNF via EMS with AMS. CTA chest ruled out PE, confirmed edema, and also showed new lung mass. CT head neg. Pt with recent admission 06/07/2020-06/17/2020 for acute metabolic encephalopathy secondary to UTI. PMH including CAD s/p PCI, morbid obesity, chronic diastolic CHF, chronic hypoxic respiratory failure on 3 L home oxygen, OSA not on CPAP, HTN, hyperlipidemia, hypothyroidism, bipolar disorder, depression, fibromyalgia, CVA, GERD, gout, sickle cell trait, lupus, type 2 diabetes, peripheral neuropathy, seizure disorder, COPD, CKD stage IIIb, history of recent LV apical thrombus treated with 3 months of anticoagulation, Covid infection in March 2021.   Clinical Impression   PTA, pt was at a SNF and required assistance for mobility and ADLs. Pt currently requiring Max cues for grooming tasks, Mod-Max A for dressing/bathing, and Max A for bed mobility. Pt smiling with therapist and fidgeting with blanket throughout. Pt presenting with decreased cognition with poor attention, awareness, and orientation. Becoming irritated when therapist attempted to transition to EOB. Pt would benefit from further acute OT to facilitate safe dc. Recommend dc to SNF for further OT to optimize safety, independence with ADLs, and return to PLOF.     Follow Up Recommendations  SNF;Supervision/Assistance - 24 hour    Equipment Recommendations  Other (comment) (TBD at next venue of care)    Recommendations for Other Services PT consult     Precautions / Restrictions Precautions Precautions: Fall Precaution Comments: on chronic O2      Mobility Bed Mobility Overal bed mobility: Needs Assistance             General bed mobility comments: Max A for reposition in bed    Transfers                      Balance                                            ADL either performed or assessed with clinical judgement   ADL Overall ADL's : Needs assistance/impaired Eating/Feeding: Set up;Bed level   Grooming: Oral care;Bed level;Cueing for sequencing;Set up;Supervision/safety Grooming Details (indicate cue type and reason): Pt attempting to brush teeth with increased cues Upper Body Bathing: Bed level;Moderate assistance   Lower Body Bathing: Maximal assistance;Bed level   Upper Body Dressing : Moderate assistance;Bed level   Lower Body Dressing: Bed level;Maximal assistance                 General ADL Comments: Focused session at bed level as pt becoming irritated with cues for sitting at EOB. Pt with decreased cognition and poor associating grooming tools with correct task. Requiring Max cues for grooming at bed level. Pt easly distracted. Max A for dressing, bathing, and bed mobility     Vision Baseline Vision/History: Wears glasses Patient Visual Report: No change from baseline       Perception     Praxis      Pertinent Vitals/Pain Pain Assessment: Faces Faces Pain Scale: Hurts little more Pain Location: LEs, post-mobility Pain Descriptors / Indicators: Discomfort;Sore Pain Intervention(s): Monitored during session;Limited activity within patient's tolerance;Repositioned     Hand Dominance Right   Extremity/Trunk Assessment Upper Extremity Assessment Upper Extremity Assessment: Generalized weakness RUE Deficits / Details: impaired  shoulder flexio 0-90 due to OA per chart review   Lower Extremity Assessment Lower Extremity Assessment: Defer to PT evaluation   Cervical / Trunk Assessment Cervical / Trunk Assessment: Other exceptions Cervical / Trunk Exceptions: incr body habitus   Communication Communication Communication: HOH   Cognition Arousal/Alertness: Awake/alert Behavior During Therapy: WFL for tasks assessed/performed Overall Cognitive Status:  History of cognitive impairments - at baseline Area of Impairment: Following commands;Problem solving;Attention;Memory;Safety/judgement;Awareness;Orientation                 Orientation Level: Disoriented to;Situation;Time;Place Current Attention Level: Sustained Memory: Decreased short-term memory;Decreased recall of precautions Following Commands: Follows one step commands inconsistently;Follows one step commands with increased time Safety/Judgement: Decreased awareness of safety;Decreased awareness of deficits Awareness: Intellectual Problem Solving: Decreased initiation;Slow processing;Difficulty sequencing;Requires verbal cues;Requires tactile cues General Comments: Pleasantly confused throughout session. Pt fidgeting with callbell cord, when asked what she was doing pt smiled and stated "making a bracelet". When handing pt a tooth brush with tooth paste, pt attempting to scrub blanket with brush. With increased cues, pt bringing to her mouth and attempting to brush gums, sustaining attention only for 10 sec before stopping task.   General Comments  2L O2    Exercises     Shoulder Instructions      Home Living Family/patient expects to be discharged to:: Skilled nursing facility                                 Additional Comments: From SN, RN reports that famiyl does not want to return to prior SNF.      Prior Functioning/Environment Level of Independence: Needs assistance        Comments: From chart review, pt was at [facility] and required assistance for ADLs. Prior to SNF rehab, pt was walkign with RW.        OT Problem List: Decreased strength;Decreased activity tolerance;Decreased coordination;Impaired balance (sitting and/or standing);Decreased knowledge of use of DME or AE;Pain;Obesity      OT Treatment/Interventions: Self-care/ADL training;Therapeutic exercise;DME and/or AE instruction;Therapeutic activities    OT Goals(Current goals can be found in  the care plan section) Acute Rehab OT Goals Patient Stated Goal: Go to a different rehab OT Goal Formulation: Patient unable to participate in goal setting Time For Goal Achievement: 06/23/20 Potential to Achieve Goals: Good  OT Frequency: Min 2X/week   Barriers to D/C:            Co-evaluation              AM-PAC OT "6 Clicks" Daily Activity     Outcome Measure Help from another person eating meals?: None Help from another person taking care of personal grooming?: A Little Help from another person toileting, which includes using toliet, bedpan, or urinal?: Total Help from another person bathing (including washing, rinsing, drying)?: A Lot Help from another person to put on and taking off regular upper body clothing?: A Little Help from another person to put on and taking off regular lower body clothing?: A Lot 6 Click Score: 15   End of Session Equipment Utilized During Treatment: Oxygen Nurse Communication: Mobility status  Activity Tolerance: Patient tolerated treatment well Patient left: in bed;with call bell/phone within reach;with bed alarm set;with nursing/sitter in room  OT Visit Diagnosis: Unsteadiness on feet (R26.81);Other abnormalities of gait and mobility (R26.89);Other symptoms and signs involving cognitive function;Muscle weakness (generalized) (M62.81)  Time: 8063-8685 OT Time Calculation (min): 16 min Charges:  OT General Charges $OT Visit: 1 Visit OT Evaluation $OT Eval Moderate Complexity: [place], OTR/L Acute Rehab Pager: [phone] Office: [place] 06/24/2020, 5:08 PM

## 2020-06-24 NOTE — NC FL2 (Signed)
[place] LEVEL OF CARE SCREENING TOOL     IDENTIFICATION  Patient Name: Brittany Roberts Birthdate: [birth year] Sex: female Admission Date (Current Location): 06/22/2020  [place] and [place] Number:  Kathleen Argue [id number] [place] and Address:  [facility] [place]. [facility], [place] [address], [place], [place] [address]      Provider Number: [id number]  Attending Physician Name and Address:  Edwin Dada, *  Relative Name and Phone Number:  Daughters Raisa Ditto - [phone] and [name] - [phone]    Current Level of Care: Hospital Recommended Level of Care: [place] Prior Approval Number:    Date Approved/Denied:   PASRR Number: [id number]  Discharge Plan: SNF    Current Diagnoses: Patient Active Problem List   Diagnosis Date Noted  . Acute encephalopathy 06/23/2020  . CAP (community acquired pneumonia) 06/23/2020  . Lung mass 06/23/2020  . Atrial fibrillation with RVR ([place]) 06/10/2020  . AMS (altered mental status) 06/07/2020  . Hypomagnesemia 02/20/2020  . Chronic respiratory failure with hypoxia, on home O2 therapy ([place]) 02/20/2020  . Monitoring for long-term anticoagulant use 12/17/2019  . LV (left ventricular) mural thrombus 12/11/2019  . Acute on chronic diastolic CHF (congestive heart failure) ([place]) 12/06/2019  . Pneumonia due to COVID-19 virus 11/01/2019  . Respiratory failure ([place]) 11/01/2019  . Memory loss 04/05/2019  . Cerebrovascular accident (CVA) ([place]) 04/05/2019  . Seizures ([place]) 04/05/2019  . Obstructive sleep apnea 04/05/2019  . Chronic heart failure with preserved ejection fraction ([place]) 01/17/2019  . OP (osteoporosis) 01/01/2019  . Abdominal wall pain in right flank 08/13/2018  . Alopecia, scarring 08/11/2018  . Chronic right shoulder pain 03/15/2018  . Bilateral primary osteoarthritis of knee 02/09/2018  . Pain in right ankle and joints of right foot 02/09/2018  . Acute exacerbation of  congestive heart failure ([place]) 12/13/2017  . Chronic kidney disease (CKD), stage IV (severe) ([place]) 09/01/2017  . Gout, arthropathy 09/01/2017  . Impacted cerumen of both ears 05/13/2017  . Sensorineural hearing loss, bilateral 05/13/2017  . Localization-related symptomatic epilepsy and epileptic syndromes with complex partial seizures, not intractable, without status epilepticus ([place]) 04/25/2017  . Fibromyalgia 04/08/2017  . Allergic rhinitis 03/11/2017  . Cardiomyopathy ([place]) 01/22/2017  . Hyperlipidemia associated with type 2 diabetes mellitus ([place]) 01/22/2017  . Bipolar disorder ([place]) 01/22/2017  . Seizure ([place])   . Thoracic aortic atherosclerosis ([place]) 01/14/2017  . Coronary artery calcification seen on CAT scan 01/14/2017  . Acute delirium 01/14/2017  . Possible Tonic-clonic seizure disorder ([place]) 01/14/2017  . CVA (cerebral vascular accident) ([place]) 01/14/2017  . Altered mental status 01/13/2017  . Mastalgia 04/20/2016  . Bilateral leg edema 02/10/2016  . Difficulty hearing 10/30/2015  . Midline low back pain with right-sided sciatica 09/25/2015  . Carpal tunnel syndrome on left 08/14/2015  . Chest pain of uncertain etiology 64/33/2951  . Primary localized osteoarthrosis, hand 06/19/2015  . Radial styloid tenosynovitis 06/19/2015  . Dysarthria 02/14/2015  . Type 2 diabetes mellitus with complication ([place])   . Cephalalgia   . Hx of systemic lupus erythematosus (SLE) ([place])   . Thyroid mass 04/26/2014  . Constipation 12/10/2013  . Benign neoplasm of colon 12/10/2013  . Type 2 diabetes mellitus with diabetic neuropathy, unspecified ([place]) 11/27/2013  . Renal cyst, right 11/09/2013  . Insulin dependent type 2 diabetes mellitus ([place]) 10/29/2013  . Hoarseness or changing voice 09/27/2013  . Dyskinesia, tardive 07/11/2013  . TMJ syndrome 02/16/2013  . Angina pectoris associated with type 2 diabetes  mellitus ([place]) 08/08/2012  . Coronary artery disease of native artery of native  heart with stable angina pectoris ([place]) 08/08/2012  . Asthma, chronic, PRESUMED wiht MULTIFACTORIAL DYSPNEA 05/30/2012  . Insomnia 09/22/2011  . Mobility poor 02/02/2011  . Chronic kidney disease, stage III (moderate) ([place]) 05/13/2010  . Chronic systolic (congestive) heart failure ([place]) 03/31/2010  . COPD (chronic obstructive pulmonary disease) ([place]) 03/26/2010  . Morbid obesity ([place]) 02/27/2010  . Unstable gait 02/27/2010  . DM (diabetes mellitus), type 2 with neurological complications ([place]) 54/62/7035  . Arthritis 04/25/2009  . Anemia 03/11/2007  . COGNITIVE IMPAIRMENT, MILD, SO STATED 03/11/2007  . RESTLESS LEG SYNDROME 03/11/2007  . Lupus ([place]) 03/11/2007  . OSTEOARTHROSIS, GENERALIZED, MULTIPLE SITES 03/11/2007  . Hypothyroidism 03/07/2007  . Hypercholesteremia 03/07/2007  . Depression 03/07/2007  . Hypertensive heart disease with heart failure ([place]) 03/07/2007  . Coronary atherosclerosis 03/07/2007  . GERD 03/07/2007  . SLEEP APNEA 03/07/2007    Orientation RESPIRATION BLADDER Height & Weight     Self  O2 (3L [place]) Incontinent,External catheter Weight: 277 lb 1.9 oz (125.7 kg) Height:  5' 3.5" (161.3 cm)  BEHAVIORAL SYMPTOMS/MOOD NEUROLOGICAL BOWEL NUTRITION STATUS    Convulsions/Seizures (History of Seizures) Incontinent Diet (See d/c summary)  AMBULATORY STATUS COMMUNICATION OF NEEDS Skin   Extensive Assist Verbally Normal                       Personal Care Assistance Level of Assistance  Bathing,Feeding,Dressing Bathing Assistance: Maximum assistance Feeding assistance: Independent Dressing Assistance: Maximum assistance     Functional Limitations Info  Sight,Hearing,Speech Sight Info: Adequate Hearing Info: Adequate Speech Info: Adequate    SPECIAL CARE FACTORS FREQUENCY  PT (By licensed PT),OT (By licensed OT)     PT Frequency: 5x/week OT Frequency: 5x/week            Contractures Contractures Info: Not present    Additional Factors Info   Code Status,Allergies,Insulin Sliding Scale Code Status Info: DNR Allergies Info: Ace Inhibitors, Isosorb Dinitrate-hydralazine, Penicillins, Buspirone, Pregabalin, Ropinirole Hydrochloride, Amantadines   Insulin Sliding Scale Info: 0-9 Units 3 times per day with meals; 0-5 Units daily at bedtime       Current Medications (06/24/2020):  This is the current hospital active medication list Current Facility-Administered Medications  Medication Dose Route Frequency Provider Last Rate Last Admin  . 0.9 %  sodium chloride infusion   Intravenous PRN Edwin Dada, MD 10 mL/hr at 06/24/20 0318 Infusion Verify at 06/24/20 0318  . allopurinol (ZYLOPRIM) tablet 200 mg  200 mg Oral Daily Edwin Dada, MD   200 mg at 06/24/20 0959  . apixaban (ELIQUIS) tablet 2.5 mg  2.5 mg Oral BID Edwin Dada, MD   2.5 mg at 06/24/20 0959  . atorvastatin (LIPITOR) tablet 20 mg  20 mg Oral QPM Danford, Suann Larry, MD   20 mg at 06/23/20 2002  . azithromycin (ZITHROMAX) 500 mg in sodium chloride 0.9 % 250 mL IVPB  500 mg Intravenous Q24H Shela Leff, MD 250 mL/hr at 06/24/20 0425 500 mg at 06/24/20 0425  . cefTRIAXone (ROCEPHIN) 1 g in sodium chloride 0.9 % 100 mL IVPB  1 g Intravenous Q24H Shela Leff, MD 200 mL/hr at 06/24/20 0424 1 g at 06/24/20 0424  . furosemide (LASIX) injection 40 mg  40 mg Intravenous BID Edwin Dada, MD   40 mg at 06/24/20 1007  . gabapentin (NEURONTIN) capsule 100 mg  100 mg Oral QHS Danford, Christopher P,  MD   100 mg at 06/24/20 0032  . insulin aspart (novoLOG) injection 0-9 Units  0-9 Units Subcutaneous Q4H Shela Leff, MD   1 Units at 06/24/20 1215  . insulin glargine (LANTUS) injection 14 Units  14 Units Subcutaneous QHS Edwin Dada, MD   14 Units at 06/24/20 0032  . ipratropium-albuterol (DUONEB) 0.5-2.5 (3) MG/3ML nebulizer solution 3 mL  3 mL Nebulization Q6H PRN Shela Leff, MD      . isosorbide  mononitrate (IMDUR) 24 hr tablet 30 mg  30 mg Oral Daily Danford, Suann Larry, MD   30 mg at 06/24/20 0959  . metoprolol succinate (TOPROL-XL) 24 hr tablet 50 mg  50 mg Oral Daily Edwin Dada, MD   50 mg at 06/24/20 0959  . mometasone-formoterol (DULERA) 200-5 MCG/ACT inhaler 2 puff  2 puff Inhalation BID Danford, Suann Larry, MD      . montelukast (SINGULAIR) tablet 10 mg  10 mg Oral QHS Danford, Suann Larry, MD   10 mg at 06/24/20 0032  . valproate (DEPACON) 250 mg in dextrose 5 % 50 mL IVPB  250 mg Intravenous Q6H Kirby-Graham, Karsten Fells, NP 52.5 mL/hr at 06/24/20 0601 250 mg at 06/24/20 0601     Discharge Medications: Please see discharge summary for a list of discharge medications.  Relevant Imaging Results:  Relevant Lab Results:   Additional Information ss#[id number]. Patient has had COVID vaccinations: 10/04/19 and 12/13/19  [place], LCSW

## 2020-06-24 NOTE — Progress Notes (Signed)
Patient blood pressures elveated at rest Md aware check for new orders.

## 2020-06-24 NOTE — TOC Initial Note (Signed)
Transition of Care [facility]) - Initial/Assessment Note    Patient Details  Name: Brittany Roberts MRN: [id number] Date of Birth: [birth year]  [facility]) CM/SW Contact:    Bethann Berkshire, LCSW Phone Number: 06/24/2020, 3:33 PM  Clinical Narrative:                  CSW called pt daughter Antoinnette. Antoinnette expressed desire for pt to go to a SNF but does not want pt to return to [facility]. She is agreeable to work up and fax bed requests to [place] facilities. FL2 complete and bed requests sent in hub.   Expected Discharge Plan: Skilled Nursing Facility Barriers to Discharge: Continued Medical Work up   Patient Goals and CMS Choice Patient states their goals for this hospitalization and ongoing recovery are:: Daughter wants a different rehab from [place]      Expected Discharge Plan and Services Expected Discharge Plan: [place]                                              Prior Living Arrangements/Services              Need for Family Participation in Patient Care: Yes (Comment) Care giver support system in place?: Yes (comment)      Activities of Daily Living      Permission Sought/Granted                  Emotional Assessment       Orientation: : Oriented to Self Alcohol / Substance Use: Not Applicable Psych Involvement: No (comment)  Admission diagnosis:  Enlarged thyroid [E04.9] Elevated LFTs [R79.89] Acute encephalopathy [G93.40] Altered mental status, unspecified altered mental status type [R41.82] AMS (altered mental status) [R41.82] Patient Active Problem List   Diagnosis Date Noted  . Acute encephalopathy 06/23/2020  . CAP (community acquired pneumonia) 06/23/2020  . Lung mass 06/23/2020  . Atrial fibrillation with RVR ([place]) 06/10/2020  . AMS (altered mental status) 06/07/2020  . Hypomagnesemia 02/20/2020  . Chronic respiratory failure with hypoxia, on home O2 therapy ([place]) 02/20/2020  .  Monitoring for long-term anticoagulant use 12/17/2019  . LV (left ventricular) mural thrombus 12/11/2019  . Acute on chronic diastolic CHF (congestive heart failure) ([place]) 12/06/2019  . Pneumonia due to COVID-19 virus 11/01/2019  . Respiratory failure ([place]) 11/01/2019  . Memory loss 04/05/2019  . Cerebrovascular accident (CVA) ([place]) 04/05/2019  . Seizures ([place]) 04/05/2019  . Obstructive sleep apnea 04/05/2019  . Chronic heart failure with preserved ejection fraction ([place]) 01/17/2019  . OP (osteoporosis) 01/01/2019  . Abdominal wall pain in right flank 08/13/2018  . Alopecia, scarring 08/11/2018  . Chronic right shoulder pain 03/15/2018  . Bilateral primary osteoarthritis of knee 02/09/2018  . Pain in right ankle and joints of right foot 02/09/2018  . Acute exacerbation of congestive heart failure ([place]) 12/13/2017  . Chronic kidney disease (CKD), stage IV (severe) ([place]) 09/01/2017  . Gout, arthropathy 09/01/2017  . Impacted cerumen of both ears 05/13/2017  . Sensorineural hearing loss, bilateral 05/13/2017  . Localization-related symptomatic epilepsy and epileptic syndromes with complex partial seizures, not intractable, without status epilepticus ([place]) 04/25/2017  . Fibromyalgia 04/08/2017  . Allergic rhinitis 03/11/2017  . Cardiomyopathy ([place]) 01/22/2017  . Hyperlipidemia associated with type 2 diabetes mellitus ([place]) 01/22/2017  . Bipolar disorder ([place]) 01/22/2017  . Seizure ([place])   .  Thoracic aortic atherosclerosis ([place]) 01/14/2017  . Coronary artery calcification seen on CAT scan 01/14/2017  . Acute delirium 01/14/2017  . Possible Tonic-clonic seizure disorder ([place]) 01/14/2017  . CVA (cerebral vascular accident) ([place]) 01/14/2017  . Altered mental status 01/13/2017  . Mastalgia 04/20/2016  . Bilateral leg edema 02/10/2016  . Difficulty hearing 10/30/2015  . Midline low back pain with right-sided sciatica 09/25/2015  . Carpal tunnel syndrome on left 08/14/2015  . Chest pain  of uncertain etiology 19/16/6060  . Primary localized osteoarthrosis, hand 06/19/2015  . Radial styloid tenosynovitis 06/19/2015  . Dysarthria 02/14/2015  . Type 2 diabetes mellitus with complication ([place])   . Cephalalgia   . Hx of systemic lupus erythematosus (SLE) ([place])   . Thyroid mass 04/26/2014  . Constipation 12/10/2013  . Benign neoplasm of colon 12/10/2013  . Type 2 diabetes mellitus with diabetic neuropathy, unspecified ([place]) 11/27/2013  . Renal cyst, right 11/09/2013  . Insulin dependent type 2 diabetes mellitus ([place]) 10/29/2013  . Hoarseness or changing voice 09/27/2013  . Dyskinesia, tardive 07/11/2013  . TMJ syndrome 02/16/2013  . Angina pectoris associated with type 2 diabetes mellitus ([place]) 08/08/2012  . Coronary artery disease of native artery of native heart with stable angina pectoris ([place]) 08/08/2012  . Asthma, chronic, PRESUMED wiht MULTIFACTORIAL DYSPNEA 05/30/2012  . Insomnia 09/22/2011  . Mobility poor 02/02/2011  . Chronic kidney disease, stage III (moderate) ([place]) 05/13/2010  . Chronic systolic (congestive) heart failure ([place]) 03/31/2010  . COPD (chronic obstructive pulmonary disease) ([place]) 03/26/2010  . Morbid obesity ([place]) 02/27/2010  . Unstable gait 02/27/2010  . DM (diabetes mellitus), type 2 with neurological complications ([place]) 04/59/9774  . Arthritis 04/25/2009  . Anemia 03/11/2007  . COGNITIVE IMPAIRMENT, MILD, SO STATED 03/11/2007  . RESTLESS LEG SYNDROME 03/11/2007  . Lupus ([place]) 03/11/2007  . OSTEOARTHROSIS, GENERALIZED, MULTIPLE SITES 03/11/2007  . Hypothyroidism 03/07/2007  . Hypercholesteremia 03/07/2007  . Depression 03/07/2007  . Hypertensive heart disease with heart failure ([place]) 03/07/2007  . Coronary atherosclerosis 03/07/2007  . GERD 03/07/2007  . SLEEP APNEA 03/07/2007   PCP:  [name], [name], MD Pharmacy:  No Pharmacies Listed    Social Determinants of Health (SDOH) Interventions    Readmission Risk Interventions No  flowsheet data found.

## 2020-06-24 NOTE — Consult Note (Addendum)
NAME:  Brittany Roberts, MRN:  [id number], DOB:  Nov 30, 1940, LOS: 1 ADMISSION DATE:  06/22/2020, CONSULTATION DATE:  06/24/2020 REFERRING MD:  Audie Pinto, CHIEF COMPLAINT:  Incidental finding of lung mass: eval for bronch/ biopsy   Brief History:  79 y.o. F former smoker ( Quit 2000 with a 120 pack year smoking history) with dementia, home dwelling, SLE not on DMARD, dCHF and COPD on 3L home O2, CAD, obesity, OSA not on CPAP, HTN, Bipolar, hypothyroidism, history stroke, DM, seizures, CKD IIIb baseline 1.5 and recent new diagnosis apical thrombus and atrial fibrillation on anticoagulation.Readmitted 12/20 with AMS, and failure to thrive.  New lung nodule and adenopathy noted on a CTA. PCCM asked to evaluate for possibility of bronch. biopsy  History of Present Illness:  Brittany Roberts is a 79 y.o. F former smoker ( Quit 2000 with a 120 pack year smoking history) with dementia, home dwelling, SLE not on DMARD, dCHF and COPD on 3L home O2, CAD, obesity, OSA not on CPAP, HTN, Bipolar, hypothyroidism, history stroke, DM, seizures, CKD IIIb baseline 1.5 and recent new diagnosis apical thrombus and atrial fibrillation on anticoagulation who presents with confusion. Discharged one week ago for encephalopathy thought to be from UTI in setting of dementia.  Treated with fluids and Rocephin and discharged to SNF.  During this hospital stay, apixaban was restarted due to new onset Afib.  Also, Torsemide was stopped due to dehydration and poor oral intake. Since SNF admission she has gradually worsened , less alert until EMS was called 12/20, and patient was transported to the ED.  In the ER, CT head unremarkable.  Cr at baseline.  Ammonia elevated.  ABG normal.  COVID negative.  BNP very elevated and CXR with edema.  CTA chest ruled out PE, confirmed edema, and also showed new lung mass and adenopathy.  PCCM have been asked to evaluate mass for potential biopsy/ bronch ( EBUS).  Past Medical History:   Past Medical  History:  Diagnosis Date  . Acute delirium 01/14/2017  . Anxiety   . Arthritis    "all over"  . Asthma   . Bipolar disorder ([place])   . Complication of anesthesia    "w/right foot OR they gave me too much and they couldn't get me woke"  . Congestive heart failure, unspecified   . Coronary artery calcification seen on CAT scan 01/14/2017  . Coronary artery disease    "I've got 1 stent" (06/30/2015)  . Depression   . Discoid lupus   . Fibromyalgia    "I've been told I have this; can't take Lyrica cause I'm allergic to it" (06/30/2015)  . GERD (gastroesophageal reflux disease)   . History of gout   . History of hiatal hernia    "real bad" (06/30/2015)  . Hypertension   . Hypothyroidism   . Memory loss   . On home oxygen therapy    "2L q hs" (06/30/2015)  . OSA (obstructive sleep apnea)    "just use my oxygen; no mask" (06/30/2015)  . Other and unspecified hyperlipidemia   . Penetrating foot wound    left nonhealing foot wound on the dorsal surface  . Peripheral neuropathy   . Pneumonia "many of times"  . Refusal of blood transfusions as patient is Jehovah's Witness   . Renal failure, unspecified    "kidneys not working 100%" (06/30/2015)  . Seizure ([place])   . Sickle cell trait ([place])   . Systemic lupus ([place])   . Thoracic aortic atherosclerosis (  [place]) 01/14/2017  . Type II diabetes mellitus ([place])   . Unspecified vitamin D deficiency     Significant Hospital Events:  06/07/2020-06/17/2020 06/23/2020>> Admit  Consults:  06/24/2020 Pulmonary  Procedures:  Spirometry 9/13 Pre bronchodilators FEV1 1.276L 73%  FVC 2.482 L 108%                             Post bronchodilators FEV1 1.325 76%  FVC 1.864 L 73%   Significant Diagnostic Tests:  06/23/2020 CTA chest No evidence of pulmonary embolism. Ill-defined right hilar soft tissue mass with adjacent right suprahilar peribronchial thickening, concerning for the presence of a primary lung malignancy. Mild right upper lobe and  bibasilar atelectasis and/or infiltrate. Small bilateral pleural effusions. Stable marked severity enlargement of the left lobe of the thyroid gland. Aortic atherosclerosis.  Micro Data:  06/23/2020>> Blood 06/23/2020 >> Urine> No Growth 06/22/2020>> Sars Covid 19 >> Negative Antimicrobials:  Azithro>>06/23/2020 Ceftriaxone>> 06/23/2020  Interim History / Subjective:  Pt is in NAD. She has advanced dementia and is a poor historian. She is on 3 L ND with sats of 100%  Objective   Blood pressure (!) 156/79, pulse 68, temperature 98.1 F (36.7 C), temperature source Oral, resp. rate 18, height 5' 3.5" (1.613 m), weight 125.7 kg, SpO2 99 %.        Intake/Output Summary (Last 24 hours) at 06/24/2020 1529 Last data filed at 06/24/2020 1000 Gross per 24 hour  Intake 63.74 ml  Output 250 ml  Net -186.26 ml   Filed Weights   06/24/20 0032  Weight: 125.7 kg    Examination: General: Awake and alert, pleasantly confused obese female , in NAD HENT: NCAT, Thick neck, No LAD Lungs: Bilateral chest excursion, diminished per bases Cardiovascular: S1, S2, RR, No RMG, distant Abdomen:Soft, NT, ND, BS +, Body mass index is 48.32 kg/m. Extremities: Warm and dry, no obvious deformities Neuro: Awake and alert, follows some commands, confused and speaks nonsense at times GU: Not assessed  Resolved Hospital Problem list     Assessment & Plan:  Ill-defined right hilar soft tissue mass with adjacent right suprahilar peribronchial thickening, concerning for the presence of a primary lung malignancy. Consulted for eval for bronch/ biopsy Plan Spoke with Daughter Brittany Roberts [phone]) about options of PET scanning as an outpatient with follow up/ diagnostics / treatment based on findings. She states she does not want to treat this mass  aggressively due to her mother's dementia and multiple other health problems.. She prefers no treatment or work up at this time. She is aware that  the nodule is there and that it could be a primary bronchogenic cancer. She would prefer her mother not be told that the nodule is there as it will just cause her distress.   PCCM will sign off as family do not wish to evaluate this mass. Please let us know if we can be of any further assistance in the care of this patient.    Magdalen Spatz, MSN, AGACNP-BC [place] for personal pager PCCM on call pager [phone] 06/24/2020 4:27 PM   Best practice (evaluated daily)  Diet: Per [facility] Pain/Anxiety/Delirium protocol (if indicated): Per Primary Team VAP protocol (if indicated): NA DVT prophylaxis: Eliquis GI prophylaxis: Per Primary Team Glucose control: Per Primary Team Mobility: Per Primary Team Disposition:Per Primary Team  Goals of Care:  Last date of multidisciplinary goals of care discussion:06/24/2020 Family and  staff present: Myself and Brittany Roberts Daughter Summary of discussion: Family do not wish to treat new lung mass Follow up goals of care discussion due: Per Primary Team Code Status: DNR  Labs   CBC: Recent Labs  Lab 06/22/20 1913 06/22/20 2029 06/22/20 2035 06/22/20 2111 06/23/20 0921 06/24/20 0255  WBC 13.0* 12.9*  --   --  11.5* 12.9*  NEUTROABS  --  8.8*  --   --   --   --   HGB 10.5* 9.8* 11.2* 10.9* 10.2* 10.0*  HCT 31.9* 29.0* 33.0* 32.0* 30.7* 28.7*  MCV 78.0* 76.9*  --   --  78.5* 76.3*  PLT 202 184  --   --  193 992    Basic Metabolic Panel: Recent Labs  Lab 06/22/20 1913 06/22/20 2035 06/22/20 2111 06/23/20 0921 06/24/20 0255  NA 142 147* 147* 144 146*  K 4.0 4.0 3.7 4.0 3.6  CL 112* 114*  --  110 111  CO2 20*  --   --  24 25  GLUCOSE 262* 220*  --  172* 173*  BUN 15 16  --  15 16  CREATININE 1.64* 1.40*  --  1.69* 1.68*  CALCIUM 9.8  --   --  9.5 9.6   GFR: Estimated Creatinine Clearance: 34.7 mL/min (A) (by C-G formula based on SCr of 1.68 mg/dL (H)). Recent Labs  Lab  06/22/20 1913 06/22/20 2029 06/23/20 0921 06/24/20 0255  PROCALCITON  --   --  0.37  --   WBC 13.0* 12.9* 11.5* 12.9*    Liver Function Tests: Recent Labs  Lab 06/22/20 1913 06/23/20 0921  AST 56* 48*  ALT 46* 43  ALKPHOS 94 89  BILITOT 1.2 0.9  PROT 7.2 6.8  ALBUMIN 3.1* 3.0*   No results for input(s): LIPASE, AMYLASE in the last 168 hours. Recent Labs  Lab 06/22/20 2231 06/23/20 0921  AMMONIA 71* 25    ABG    Component Value Date/Time   PHART 7.360 06/22/2020 2111   PCO2ART 36.7 06/22/2020 2111   PO2ART 96 06/22/2020 2111   HCO3 20.8 06/22/2020 2111   TCO2 22 06/22/2020 2111   ACIDBASEDEF 4.0 (H) 06/22/2020 2111   O2SAT 97.0 06/22/2020 2111     Coagulation Profile: Recent Labs  Lab 06/22/20 2029  INR 1.5*    Cardiac Enzymes: No results for input(s): CKTOTAL, CKMB, CKMBINDEX, TROPONINI in the last 168 hours.  HbA1C: Hgb A1c MFr Bld  Date/Time Value Ref Range Status  06/07/2020 10:20 PM 8.5 (H) 4.8 - 5.6 % Final    Comment:    (NOTE) Pre diabetes:          5.7%-6.4%  Diabetes:              >6.4%  Glycemic control for   <7.0% adults with diabetes   02/20/2020 11:46 AM 7.7 (H) <5.7 % of total Hgb Final    Comment:    For someone without known diabetes, a hemoglobin A1c value of 6.5% or greater indicates that they may have  diabetes and this should be confirmed with a follow-up  test. . For someone with known diabetes, a value <7% indicates  that their diabetes is well controlled and a value  greater than or equal to 7% indicates suboptimal  control. A1c targets should be individualized based on  duration of diabetes, age, comorbid conditions, and  other considerations. . Currently, no consensus exists regarding use of hemoglobin A1c for diagnosis of diabetes for children. Marland Kitchen  CBG: Recent Labs  Lab 06/23/20 2017 06/23/20 2335 06/24/20 0338 06/24/20 0738 06/24/20 1119  GLUCAP 165* 142* 156* 128* 145*    Review of Systems:    Unable 2/2 dementia  Past Medical History:  She,  has a past medical history of Acute delirium (01/14/2017), Anxiety, Arthritis, Asthma, Bipolar disorder ([place]), Complication of anesthesia, Congestive heart failure, unspecified, Coronary artery calcification seen on CAT scan (01/14/2017), Coronary artery disease, Depression, Discoid lupus, Fibromyalgia, GERD (gastroesophageal reflux disease), History of gout, History of hiatal hernia, Hypertension, Hypothyroidism, Memory loss, On home oxygen therapy, OSA (obstructive sleep apnea), Other and unspecified hyperlipidemia, Penetrating foot wound, Peripheral neuropathy, Pneumonia ("many of times"), Refusal of blood transfusions as patient is Jehovah's Witness, Renal failure, unspecified, Seizure ([place]), Sickle cell trait ([place]), Systemic lupus ([place]), Thoracic aortic atherosclerosis ([place]) (01/14/2017), Type II diabetes mellitus ([place]), and Unspecified vitamin D deficiency.   Surgical History:   Past Surgical History:  Procedure Laterality Date  . ANKLE ARTHROSCOPY WITH OPEN REDUCTION INTERNAL FIXATION (ORIF) Right 03/2011  . CARDIAC CATHETERIZATION N/A 07/01/2015   Procedure: Left Heart Cath and Coronary Angiography;  Surgeon: Adrian Prows, MD;  Location: [facility];  Service: Cardiovascular;  Laterality: N/A;  . CARDIAC CATHETERIZATION N/A 07/01/2015   Procedure: Intravascular Pressure Wire/FFR Study;  Surgeon: Adrian Prows, MD;  Location: [facility];  Service: Cardiovascular;  Laterality: N/A;  . CARPAL TUNNEL RELEASE Bilateral   . CATARACT EXTRACTION W/ INTRAOCULAR LENS  IMPLANT, BILATERAL Bilateral   . CHOLECYSTECTOMY OPEN    . COLONOSCOPY N/A 12/10/2013   Procedure: COLONOSCOPY;  Surgeon: Inda Castle, MD;  Location: [facility];  Service: Endoscopy;  Laterality: N/A;  . FRACTURE SURGERY    . INCISION AND DRAINAGE ABSCESS Left    foot "under my little toe"  . KNEE LIGAMENT RECONSTRUCTION Right   . LEFT HEART CATHETERIZATION WITH CORONARY  ANGIOGRAM N/A 08/08/2012   Procedure: LEFT HEART CATHETERIZATION WITH CORONARY ANGIOGRAM;  Surgeon: [name], MD;  Location: [facility] CATH LAB;  Service: Cardiovascular;  Laterality: N/A;  . NASAL SINUS SURGERY    . PERCUTANEOUS CORONARY STENT INTERVENTION (PCI-S) N/A 08/21/2012   Procedure: PERCUTANEOUS CORONARY STENT INTERVENTION (PCI-S);  Surgeon: [name], MD;  Location[facility] C[facility] LAB;  Service: Cardiovascular;  Laterality: N/A;  . TUBAL LIGATION       Social History:   reports that she quit smoking about 21 years ago. Her smoking use included cigarettes. She started smoking about 60 years ago. She has a 120.00 pack-year smoking history. She has never used smokeless tobacco. She reports that she does not drink alcohol and does not use drugs.   Family History:  Her family history includes Asthma in her brother; Cancer in her brother; Clotting disorder in her mother; Diabetes in her mother, sister, and sister; Heart disease in her daughter, mother, and son; Kidney disease in her brother; Lupus in her son; Pneumonia in her father; Rheum arthritis in her father. There is no history of Colon cancer.   Allergies Allergies  Allergen Reactions  . Ace Inhibitors Other (See Comments)    Coincided with sig bump in creat. Retried and creat bumped again.   Marland Kitchen Penicillins Anaphylaxis, Swelling and Rash    Has patient had a PCN reaction causing immediate rash, facial/tongue/throat swelling, SOB or lightheadedness with hypotension: Yes Has patient had a PCN reaction causing severe rash involving mucus membranes or skin necrosis: Yes Has patient had a PCN reaction that required hospitalization: Yes Has patient  had a PCN reaction occurring within the last 10 years: No If all of the above answers are "NO", then may proceed with Cephalosporin use.   Marland Kitchen Buspirone Other (See Comments)    pain   . Isosorb Dinitrate-Hydralazine Other (See Comments)    Sleep all the time.   . Pregabalin Swelling  .  Ropinirole Hydrochloride Swelling  . Amantadines Rash    "Swelling of the tongue"     Home Medications  Prior to Admission medications   Medication Sig Start Date End Date Taking? Authorizing Provider  acetaminophen (TYLENOL) 500 MG tablet Take 500 mg by mouth every 6 (six) hours as needed for moderate pain.   Yes [provider]  allopurinol (ZYLOPRIM) 100 MG tablet Take 2 tablets (200 mg total) by mouth daily. 02/18/20  Yes [name], [name], MD  apixaban (ELIQUIS) 2.5 MG TABS tablet Take 1 tablet (2.5 mg total) by mouth 2 (two) times daily. 06/13/20  Yes [place] Donnamarie Poag, DO  Ascorbic Acid (VITAMIN C) 500 MG CAPS Take 1 capsule by mouth daily. Patient taking differently: Take 500 mg by mouth daily. 02/26/20  Yes [name], [name], MD  atorvastatin (LIPITOR) 20 MG tablet TAKE 1 TABLET(20 MG) BY MOUTH DAILY Patient taking differently: Take 20 mg by mouth every evening. 06/03/20  Yes [name], [name], MD  B Complex Vitamins (VITAMIN B COMPLEX) TABS Take 1 tablet by mouth daily.   Yes [provider]  Cholecalciferol (VITAMIN D3) 125 MCG (5000 UT) TABS Take 5,000 Units by mouth daily.   Yes [provider]  divalproex (DEPAKOTE) 125 MG DR tablet Take 125 mg by mouth 2 (two) times daily.   Yes [provider]  DULoxetine (CYMBALTA) 60 MG capsule Take 1 capsule (60 mg total) by mouth daily. 10/20/19  Yes [name], [name], MD  ferrous sulfate 325 (65 FE) MG tablet Take 325 mg by mouth 2 (two) times daily with a meal.    Yes [provider]  Fluocinolone Acetonide Scalp 0.01 % OIL Apply 1 application topically at bedtime as needed (itching scalp).  07/30/19  Yes [provider]  Fluticasone-Salmeterol (ADVAIR) 250-50 MCG/DOSE AEPB Inhale 1 puff into the lungs 2 (two) times daily.   Yes [provider]  gabapentin (NEURONTIN) 100 MG capsule Take 1 capsule (100 mg total) by mouth at bedtime. Patient taking differently: Take 100 mg by mouth at  bedtime. For muscle spasms 06/13/20  Yes [place] Donnamarie Poag, DO  insulin glargine (LANTUS) 100 UNIT/ML injection Inject 0.14 mLs (14 Units total) into the skin daily. Patient taking differently: Inject 14 Units into the skin at bedtime. 06/15/20  Yes [place] Eric J, DO  isosorbide mononitrate (IMDUR) 30 MG 24 hr tablet TAKE 1 TABLET(30 MG) BY MOUTH DAILY Patient taking differently: Take 30 mg by mouth daily. 02/04/20  Yes Patwardhan, Manish J, MD  levETIRAcetam (KEPPRA) 500 MG tablet Take 1 tablet (500 mg total) by mouth 2 (two) times daily. 07/30/19  Yes Suzzanne Cloud, NP  melatonin 5 MG TABS Take 10 mg by mouth at bedtime.   Yes [provider]  memantine (NAMENDA) 10 MG tablet Take 10 mg by mouth daily.   Yes [provider]  metoprolol succinate (TOPROL-XL) 50 MG 24 hr tablet TAKE 1 TABLET BY MOUTH EVERY DAY. TAKE WITH OR IMMEDIATELY FOLLOWING A MEAL Patient taking differently: Take 50 mg by mouth daily. 12/17/19  Yes [name], [name], MD  montelukast (SINGULAIR) 10 MG tablet TAKE 1 TABLET(10  MG) BY MOUTH AT BEDTIME Patient taking differently: Take 10 mg by mouth at bedtime. 03/05/20  Yes [name], [name], MD  Omega-3 Fatty Acids (FISH OIL) 1000 MG CAPS Take 1,000 mg by mouth daily.    Yes [provider]  pantoprazole (PROTONIX) 40 MG tablet TAKE 1 TABLET(40 MG) BY MOUTH DAILY Patient taking differently: Take 40 mg by mouth daily. 08/28/19  Yes [name], [name], MD  Polyethyl Glycol-Propyl Glycol (SYSTANE) 0.4-0.3 % GEL ophthalmic gel Place 1 application into both eyes every 4 (four) hours as needed (dry eyes).   Yes [provider]  PROAIR HFA 108 (90 Base) MCG/ACT inhaler INHALE 2 PUFF BY MOUTH EVERY 4 HOURS AS NEEDED USE ONLY IF YOU ARE WHEEZING Patient taking differently: Inhale 2 puffs into the lungs every 4 (four) hours as needed for wheezing or shortness of breath. 10/17/19  Yes [name], [name], MD  Accu-Chek Softclix Lancets lancets Use to test blood sugars 1-2  times daily.Dx:e11.9 05/12/20   [name], [name], MD  Blood Glucose Monitoring Suppl (ACCU-CHEK GUIDE) w/Device KIT Use to test blood sugars 1-2 times daily. Dx:e11.9 05/14/20   [name], [name], MD  glucose blood (ACCU-CHEK GUIDE) test strip Use to test blood sugars 1-2 times daily. Dx: e11.9 05/14/20   [name], [name], MD  Insulin Pen Needle (B-D ULTRAFINE III SHORT PEN) 31G X 8 MM MISC CHECK BLOOD SUGAR 3 TIMES A DAY 03/15/19   [name], [name], MD  melatonin 3 MG TABS tablet Take 1 tablet (3 mg total) by mouth at bedtime. Patient not taking: Reported on 06/23/2020 06/16/20   [place] Donnamarie Poag, DO  Spacer/Aero-Holding Chambers (AEROCHAMBER PLUS) inhaler Use as instructed to use with inahaler. 07/17/19   [name], [name], MD  SYMBICORT 80-4.5 MCG/ACT inhaler INHALE 2 PUFFS INTO THE LUNGS TWICE DAILY Patient not taking: Reported on 06/23/2020 03/31/20   [name], [name], MD         Magdalen Spatz, MSN, AGACNP-BC [place] for personal pager PCCM on call pager [phone] 06/24/2020 4:27 PM

## 2020-06-24 NOTE — Progress Notes (Signed)
PROGRESS NOTE    Brittany Roberts  ASN:[id number] DOB: 10/16/1940 DOA: 06/22/2020 PCP: [name], [name], MD      Brief Narrative:  Mrs. Shuffler is a 79 y.o. F with dementia, home dwelling, SLE not on DMARD, dCHF and COPD on 3L home O2, CAD, obesity, OSA not on CPAP, HTN, hypothyroidism, history stroke, DM, seizures, CKD IIIb baseline 1.5 and recent new diagnosis apical thrombus and atrial fibrillation on anticoagulation who presents with confusion.  Admitted 2 weeks ago for encephalopathy thought to be from UTI in setting of dementia.  Treated with fluids and Rocephin and discharged to SNF.  During that hospital stay, apixaban was restarted due to new onset Afib.  Also, Torsemide was stopped due to perceived dehydration and poor oral intake.  Since going to SNF, patient gradually worsened, more somnolent, sluggish.  Daughter thought this was triggered by new start Depakote.  Finally on the day of admission when this was severe, so EMS were activated.  In the ER, CT head unremarkable.  Cr at baseline.  Ammonia elevated.  ABG normal.  COVID negative.  BNP very elevated and CXR with edema.  CTA chest ruled out PE, confirmed edema, and also showed new lung mass.         Assessment & Plan:  Acute metabolic encephalapthy in setting of dementia. Multifactorial in setting of dementia.  Given ammonia and temporal assocation with Depakote, this is likely also contributing.    No focal findings to suggest stroke and MRI normal.  EEG rules out status.  TSH normal.  B12 replete.  Some of this may also be delirium (it is waxing and waning per nursing) driven by CHF flare and infection.  Renal function and electrolytes normal ABG ruled out hypercarbia.    Daughter reports at baseline before Dec 4th admission, patient lived at home with supervision, couldn't cook and had some mild memory impairment but was mostly independent with a rollator.  -Delirium precautions -Early mobility   -Stop  depakote -Treat underlying illnesses -Avoid psychotropic medicines    Acute on chronic diastolic CHF Patient with cardiomegaly and pulmonary edema on CXR and BNP >1000.    Recent echo showed EF 55-60% No I/Os documented in the ER yesterday despite orders and direct communication to nursing.  No weights documented in ER either.  -Continue Furosemide IV twice a day  -K supplement -Strict I/Os, daily weights, telemetry  -Daily monitoring renal function    Likely pneumonia, bilateral lower lobes Sepsis ruled out. -Continue azithromyin and ceftriaxone -Flutter valve -Incentive spirometry   Hypernatremia Stable -Daily BMP while on diuresis   Lung mass Family [name] daughter) clear to me that they would strongly consult patient not to undergo chemo, given her poor memory/dementia and poor functional status (rollator dependent, mostly sedentary) -Consult Pulmonology for recommendations re: biopsy   Transient hyperammonemia Probably from depakote, recently started at [facility].  No history cirrhosis.  Lactulose enema given once, ammonia resolved but mentation still poor   Paroxysmal atrial fibrillation -Continue apixaban  Coronary disease and cerebrovascular disease, secondary prevention Hypertension BP normal -Continue metoprolol, Imdur, atorvastatin -Add amlodipine -Continue diuresis  Diabetes, poorly controlled with hyperglycemia on admission Glucoses improved now -Continue Lantus and SS correction insulin   CKD stage IIIb Cr stable on diuresis  Transamminits Congestive, superimposed on NASH -Trend LFTs  COPD with chronic hypoxic respiratory failure No clear flare of this -Continue ICS LABA, Singulair  Hypothyroidism Benign thyroid mass Mass unchanged from previous imaging.  Biopsied back in 2016  and was benign. -Continue levothyroxine  Lupus Not on DMARD for now  Schizophrenia and Bipolar disorder --> RULED OUT Patient has an old history of schizophrenia.   More recetntly, changed PCPs, was referred to psychiatry who felt patient had no signs of psychotic illness.  Not on any neuroleptics.  -Stop depakote -Hold Duloxetine until mentation improves  History of seizure Single seizure witnessed in the ER in Oct 2020.  Followed by Dr. Delice Lesch.  Has been stable on Keppra since 2020 without SEs and without further siezure.   EEG normal here. Neurology added depakote, but appear to have been unaware of recent history of depakote added at outside facility and precipitating somnolence. -Stop Depakote -Resume home Keppra -Follow up with Neurology as outpatient -Continue gabapentin  Gout No acute flare -Continue allopurinol           Disposition: Status is: Inpatient  Remains inpatient appropriate because:IV treatments appropriate due to intensity of illness or inability to take PO   Dispo: The patient is from: Home              Anticipated d/c is to: SNF              Anticipated d/c date is: 3 days              Patient currently is not medically stable to d/c.     Patient was admitted with encephalopathy, found to be due to delirium from pneumonia and CHF, probably also side effect of new Depakote.  We will continue IV diuresis, and IV antibiotics.  She will need physical therapy, and likely SNF placement, possibly in 2 days if her mentation improves to baseline.         MDM: The below labs and imaging reports reviewed and summarized above.  Medication management as above.     DVT prophylaxis: apixaban (ELIQUIS) tablet 2.5 mg Start: 06/23/20 2130Apixaban apixaban (ELIQUIS) tablet 2.5 mg  Code Status: DNR Family Communication: Daughter by phone again    Consultants:   Neurology  Procedures:   12/20 CTA chest -- no PE, right hilar mass, pleural effusions small, edema  12/20 US abdomen -- fatty liver  12/20 CT abdomen and Pelvis -- unremarkable  12/20 US thyroid -- known heterogenous thyroid mass  12/20 MR  brain -- motion degraded but no stroke  12/20 EEG -- encephalopathic, no seizure foci   Antimicrobials:    cefttriaxone 12/19 >>  azithromycin 12/20 >>  Culture data:   12/19 urine culture - no growth  12/20 blood culture pending           Subjective: Patient agitated overnight and fighting with nursing, but this morning more cooperative, interactive at times, appropriate at times, but also somewhat at other times.  No respiratory distress, vomiting, diarrhea, pain complaints.  No fever.  Patient endorses no symptoms        Objective: Vitals:   06/24/20 0032 06/24/20 0414 06/24/20 0734 06/24/20 1118  BP:  (!) 169/93 (!) 163/95 (!) 156/79  Pulse:  85 72 68  Resp:  15 20 18   Temp:  98.8 F (37.1 C) 98.4 F (36.9 C) 98.1 F (36.7 C)  TempSrc:  Oral Oral Oral  SpO2:  100% 100% 99%  Weight: 125.7 kg     Height: 5' 3.5" (1.613 m)       Intake/Output Summary (Last 24 hours) at 06/24/2020 1512 Last data filed at 06/24/2020 1000 Gross per 24 hour  Intake 63.74 ml  Output 250  ml  Net -186.26 ml   Filed Weights   06/24/20 0032  Weight: 125.7 kg    Examination: General appearance: Obese elderly female, lying in bed, sleepy, opens eyes to touch, answers nursing occasionally, not me, answers are simple and she appears somewhat confused and dazed.     HEENT: Anicteric, conjunctival pink, pupils equal round and reactive to light, no nasal discharge, deformity.  Oropharynx moist, no oral lesions. Skin: No suspicious rashes or lesions on the face, neck, chest, arms, or legs. Cardiac: Regular rate and rhythm, no murmurs, JVP elevated, mild nonpitting lower extremity edema. Respiratory: Lung sounds distant due to body habitus, I do not appreciate wheeze. Abdomen: Soft without tenderness palpation or guarding. MSK:  Neuro: Arouses to touch and voice, does not follow commands, appears severely generalized weakness, no focal weakness.  Speech is fluent but halting and  sparse, and only answers simple questions.  Mostly does not follow commands. Psych: Somnolent.       Data Reviewed: I have personally reviewed following labs and imaging studies:  CBC: Recent Labs  Lab 06/22/20 1913 06/22/20 2029 06/22/20 2035 06/22/20 2111 06/23/20 0921 06/24/20 0255  WBC 13.0* 12.9*  --   --  11.5* 12.9*  NEUTROABS  --  8.8*  --   --   --   --   HGB 10.5* 9.8* 11.2* 10.9* 10.2* 10.0*  HCT 31.9* 29.0* 33.0* 32.0* 30.7* 28.7*  MCV 78.0* 76.9*  --   --  78.5* 76.3*  PLT 202 184  --   --  193 253   Basic Metabolic Panel: Recent Labs  Lab 06/22/20 1913 06/22/20 2035 06/22/20 2111 06/23/20 0921 06/24/20 0255  NA 142 147* 147* 144 146*  K 4.0 4.0 3.7 4.0 3.6  CL 112* 114*  --  110 111  CO2 20*  --   --  24 25  GLUCOSE 262* 220*  --  172* 173*  BUN 15 16  --  15 16  CREATININE 1.64* 1.40*  --  1.69* 1.68*  CALCIUM 9.8  --   --  9.5 9.6   GFR: Estimated Creatinine Clearance: 34.7 mL/min (A) (by C-G formula based on SCr of 1.68 mg/dL (H)). Liver Function Tests: Recent Labs  Lab 06/22/20 1913 06/23/20 0921  AST 56* 48*  ALT 46* 43  ALKPHOS 94 89  BILITOT 1.2 0.9  PROT 7.2 6.8  ALBUMIN 3.1* 3.0*   No results for input(s): LIPASE, AMYLASE in the last 168 hours. Recent Labs  Lab 06/22/20 2231 06/23/20 0921  AMMONIA 71* 25   Coagulation Profile: Recent Labs  Lab 06/22/20 2029  INR 1.5*   Cardiac Enzymes: No results for input(s): CKTOTAL, CKMB, CKMBINDEX, TROPONINI in the last 168 hours. BNP (last 3 results) No results for input(s): PROBNP in the last 8760 hours. HbA1C: No results for input(s): HGBA1C in the last 72 hours. CBG: Recent Labs  Lab 06/23/20 2017 06/23/20 2335 06/24/20 0338 06/24/20 0738 06/24/20 1119  GLUCAP 165* 142* 156* 128* 145*   Lipid Profile: No results for input(s): CHOL, HDL, LDLCALC, TRIG, CHOLHDL, LDLDIRECT in the last 72 hours. Thyroid Function Tests: Recent Labs    06/22/20 2231  TSH 0.840    Anemia Panel: Recent Labs    06/24/20 0255  VITAMINB12 2,994*   Urine analysis:    Component Value Date/Time   COLORURINE AMBER (A) 06/23/2020 0055   APPEARANCEUR HAZY (A) 06/23/2020 0055   LABSPEC >1.046 (H) 06/23/2020 0055   PHURINE 5.0 06/23/2020 0055  GLUCOSEU NEGATIVE 06/23/2020 0055   HGBUR MODERATE (A) 06/23/2020 0055   HGBUR negative 02/26/2009 1314   BILIRUBINUR NEGATIVE 06/23/2020 0055   BILIRUBINUR NEG 04/05/2014 1455   KETONESUR NEGATIVE 06/23/2020 0055   PROTEINUR >=300 (A) 06/23/2020 0055   UROBILINOGEN 0.2 08/01/2014 2305   NITRITE NEGATIVE 06/23/2020 0055   LEUKOCYTESUR NEGATIVE 06/23/2020 0055   Sepsis Labs: @LABRCNTIP (procalcitonin:4,lacticacidven:4)  ) Recent Results (from the past 240 hour(s))  SARS Coronavirus 2 by RT PCR (hospital order, performed in [facility] hospital lab) Nasopharyngeal Nasopharyngeal Swab     Status: None   Collection Time: 06/16/20  3:20 PM   Specimen: Nasopharyngeal Swab  Result Value Ref Range Status   SARS Coronavirus 2 NEGATIVE NEGATIVE Final    Comment: (NOTE) SARS-CoV-2 target nucleic acids are NOT DETECTED.  The SARS-CoV-2 RNA is generally detectable in upper and lower respiratory specimens during the acute phase of infection. The lowest concentration of SARS-CoV-2 viral copies this assay can detect is 250 copies / mL. A negative result does not preclude SARS-CoV-2 infection and should not be used as the sole basis for treatment or other patient management decisions.  A negative result may occur with improper specimen collection / handling, submission of specimen other than nasopharyngeal swab, presence of viral mutation(s) within the areas targeted by this assay, and inadequate number of viral copies (<250 copies / mL). A negative result must be combined with clinical observations, patient history, and epidemiological information.  Fact Sheet for Patients:   StrictlyIdeas.no  Fact  Sheet for Healthcare Providers: BankingDealers.co.za  This test is not yet approved or  cleared by the [place] FDA and has been authorized for detection and/or diagnosis of SARS-CoV-2 by FDA under an Emergency Use Authorization (EUA).  This EUA will remain in effect (meaning this test can be used) for the duration of the COVID-19 declaration under Section 564(b)(1) of the Act, 21 U.S.C. section 360bbb-3(b)(1), unless the authorization is terminated or revoked sooner.  Performed at [facility], [place] [address] [place], [place] [address]   Resp Panel by RT-PCR (Flu A&B, Covid) Nasopharyngeal Swab     Status: None   Collection Time: 06/22/20 10:56 PM   Specimen: Nasopharyngeal Swab; Nasopharyngeal(NP) swabs in vial transport medium  Result Value Ref Range Status   SARS Coronavirus 2 by RT PCR NEGATIVE NEGATIVE Final    Comment: (NOTE) SARS-CoV-2 target nucleic acids are NOT DETECTED.  The SARS-CoV-2 RNA is generally detectable in upper respiratory specimens during the acute phase of infection. The lowest concentration of SARS-CoV-2 viral copies this assay can detect is 138 copies/mL. A negative result does not preclude SARS-Cov-2 infection and should not be used as the sole basis for treatment or other patient management decisions. A negative result may occur with  improper specimen collection/handling, submission of specimen other than nasopharyngeal swab, presence of viral mutation(s) within the areas targeted by this assay, and inadequate number of viral copies(<138 copies/mL). A negative result must be combined with clinical observations, patient history, and epidemiological information. The expected result is Negative.  Fact Sheet for Patients:  EntrepreneurPulse.com.au  Fact Sheet for Healthcare Providers:  IncredibleEmployment.be  This test is no t yet approved or cleared by the [place] FDA and   has been authorized for detection and/or diagnosis of SARS-CoV-2 by FDA under an Emergency Use Authorization (EUA). This EUA will remain  in effect (meaning this test can be used) for the duration of the COVID-19 declaration under Section 564(b)(1) of the Act,  21 U.S.C.section 360bbb-3(b)(1), unless the authorization is terminated  or revoked sooner.       Influenza A by PCR NEGATIVE NEGATIVE Final   Influenza B by PCR NEGATIVE NEGATIVE Final    Comment: (NOTE) The Xpert Xpress SARS-CoV-2/FLU/RSV plus assay is intended as an aid in the diagnosis of influenza from Nasopharyngeal swab specimens and should not be used as a sole basis for treatment. Nasal washings and aspirates are unacceptable for Xpert Xpress SARS-CoV-2/FLU/RSV testing.  Fact Sheet for Patients: EntrepreneurPulse.com.au  Fact Sheet for Healthcare Providers: IncredibleEmployment.be  This test is not yet approved or cleared by the [place] FDA and has been authorized for detection and/or diagnosis of SARS-CoV-2 by FDA under an Emergency Use Authorization (EUA). This EUA will remain in effect (meaning this test can be used) for the duration of the COVID-19 declaration under Section 564(b)(1) of the Act, 21 U.S.C. section 360bbb-3(b)(1), unless the authorization is terminated or revoked.  Performed at [facility], [place] [address], [place], [place] [address]   Urine Culture     Status: None   Collection Time: 06/23/20 12:47 AM   Specimen: Urine, Catheterized  Result Value Ref Range Status   Specimen Description URINE, CATHETERIZED  Final   Special Requests NONE  Final   Culture   Final    NO GROWTH Performed at [facility], [address] [place], [place] [address]    Report Status 06/23/2020 FINAL  Final  Culture, blood (routine x 2)     Status: None (Preliminary result)   Collection Time: 06/23/20  9:21 AM   Specimen: BLOOD  Result Value Ref Range  Status   Specimen Description BLOOD RIGHT ANTECUBITAL  Final   Special Requests   Final    BOTTLES DRAWN AEROBIC AND ANAEROBIC Blood Culture adequate volume   Culture   Final    NO GROWTH < 24 HOURS Performed at [facility], [place] [address], [place], [place] [address]    Report Status PENDING  Incomplete         Radiology Studies: EEG  Result Date: 06/23/2020 Lora Havens, MD     06/23/2020  1:43 PM Patient Name: SHAWNETTA LEIN MRN: [id number] Epilepsy Attending: Lora Havens Referring Physician/Provider: Dr Derrick Ravel Date: 06/23/2020 Duration: 27.45 mins Patient history: 79 year old female with a history of atrial fibrillation on Eliquis, seizure x 1, recent UTI with AMS, re-presenting with altered mental status and left sided weakness. EEG to evaluate for seizure. Level of alertness: Awake AEDs during EEG study: LEv Technical aspects: This EEG study was done with scalp electrodes positioned according to the 10-20 International system of electrode placement. Electrical activity was acquired at a sampling rate of 500Hz  and reviewed with a high frequency filter of 70Hz  and a low frequency filter of 1Hz . EEG data were recorded continuously and digitally stored. Description: No posterior dominant rhythm was seen. EEG showed continuous generalized 3 to 6 Hz theta-delta slowing.  Hyperventilation and photic stimulation were not performed.   ABNORMALITY -Continuous slow, generalized IMPRESSION: This study is suggestive of moderate diffuse encephalopathy, nonspecific etiology. No seizures or epileptiform discharges were seen throughout the recording. Lora Havens    CT Angio Chest PE W and/or Wo Contrast  Result Date: 06/23/2020 CLINICAL DATA:  Altered mental status. EXAM: CT ANGIOGRAPHY CHEST WITH CONTRAST TECHNIQUE: Multidetector CT imaging of the chest was performed using the standard protocol during bolus administration of intravenous contrast. Multiplanar CT image  reconstructions and MIPs were obtained to evaluate the vascular anatomy. CONTRAST:  29mL OMNIPAQUE IOHEXOL 350 MG/ML SOLN COMPARISON:  January 13, 2017 FINDINGS: Cardiovascular: There is marked severity calcification of the aortic arch. Satisfactory opacification of the pulmonary arteries to the segmental level. No evidence of pulmonary embolism. There is mild cardiomegaly with marked severity coronary artery calcification. No pericardial effusion. Mediastinum/Nodes: Mild pretracheal and subcarinal lymphadenopathy is seen. Marked severity narrowing of the origin of the right main bronchus is noted (axial CT image 31, CT series number 6). An ill-defined right hilar soft tissue mass is seen which measures approximately 2.4 cm x 1.8 cm. Adjacent right suprahilar peribronchial thickening is also present (approximately 7.5 mm in thickness). Stable marked severity enlargement of the left lobe of the thyroid gland is seen. The esophagus demonstrates no significant findings. Lungs/Pleura: Mild atelectasis and/or infiltrate is seen within the right apex, posterior aspect of the right upper lobe and bilateral lung bases. Small bilateral pleural effusions are noted. No pneumothorax is identified. Upper Abdomen: Surgical clips are seen within the gallbladder fossa. Musculoskeletal: No chest wall abnormality. No acute or significant osseous findings. Review of the MIP images confirms the above findings. IMPRESSION: 1. No evidence of pulmonary embolism. 2. Ill-defined right hilar soft tissue mass with adjacent right suprahilar peribronchial thickening, concerning for the presence of a primary lung malignancy. 3. Mild right upper lobe and bibasilar atelectasis and/or infiltrate. 4. Small bilateral pleural effusions. 5. Stable marked severity enlargement of the left lobe of the thyroid gland. 6. Aortic atherosclerosis. Aortic Atherosclerosis (ICD10-I70.0). Electronically Signed   By: Virgina Norfolk M.D.   On: 06/23/2020 01:10    MR BRAIN WO CONTRAST  Result Date: 06/23/2020 CLINICAL DATA:  Delirium. Additional history provided: Altered mental status. EXAM: MRI HEAD WITHOUT CONTRAST TECHNIQUE: Multiplanar, multiecho pulse sequences of the brain and surrounding structures were obtained without intravenous contrast. COMPARISON:  Noncontrast head CT 06/22/2020.  Brain MRI 06/12/2020. FINDINGS: Brain: Due to altered mental status, the patient was unable to tolerate the full examination. Only axial and coronal diffusion-weighted sequences, a sagittal T1 weighted sequence, an axial T2 weighted sequence and an axial T2/FLAIR sequence could be obtained. The acquired sequences are motion degraded. Most notably, the sagittal T1 weighted sequence is moderate to severely motion degraded. Mild cerebral and cerebellar atrophy. No evidence of acute infarction. Redemonstrated chronic cortically based infarct within the mid to anterior right frontal lobe. Background mild multifocal T2/FLAIR hyperintensity within the cerebral white matter which is nonspecific, but compatible with chronic small vessel ischemic disease. No evidence of intracranial mass. No extra-axial fluid collection. No midline shift. Partially empty sella turcica. Vascular: Expected proximal arterial flow voids. Skull and upper cervical spine: Within described limitations, no focal marrow lesion is identified. Sinuses/Orbits: Visualized orbits show no acute finding. Mild mucosal thickening within the right maxillary sinus with associated prominent chronic reactive osteitis. IMPRESSION: Motion degraded and prematurely terminated examination as described. No evidence of acute infarction. Redemonstrated chronic cortically based right frontal lobe MCA territory infarct. Background mild generalized parenchymal atrophy and chronic small vessel ischemic disease. Chronic right maxillary sinusitis. Electronically Signed   By: Kellie Simmering DO   On: 06/23/2020 07:56   CT ABDOMEN PELVIS W  CONTRAST  Result Date: 06/23/2020 CLINICAL DATA:  Altered mental status. EXAM: CT ABDOMEN AND PELVIS WITH CONTRAST TECHNIQUE: Multidetector CT imaging of the abdomen and pelvis was performed using the standard protocol following bolus administration of intravenous contrast. CONTRAST:  74mL OMNIPAQUE IOHEXOL 350 MG/ML SOLN COMPARISON:  December 05, 2019 FINDINGS: The study is limited secondary to patient motion. Lower chest: Mild atelectasis and/or infiltrate is seen within the bilateral lung bases. Small bilateral pleural effusions are noted. Hepatobiliary: No focal liver abnormality is seen. Status post cholecystectomy. No biliary dilatation. Pancreas: Unremarkable. No pancreatic ductal dilatation or surrounding inflammatory changes. Spleen: Chronic curvilinear calcification is seen along the periphery of the spleen. Adrenals/Urinary Tract: Adrenal glands are unremarkable. Kidneys are normal in size, without renal calculi or hydronephrosis. Multiple stable bilateral simple renal cysts are seen. Bladder is unremarkable. Stomach/Bowel: Stomach is within normal limits. The appendix is not clearly identified. No evidence of bowel wall thickening, distention, or inflammatory changes. Noninflamed diverticula are seen within the sigmoid colon. Vascular/Lymphatic: There is marked severity aortic calcification and atherosclerosis. No enlarged abdominal or pelvic lymph nodes. Reproductive: Stable calcified uterine fibroids are present. The bilateral adnexa are unremarkable. Other: No abdominal wall hernia or abnormality. No abdominopelvic ascites. Musculoskeletal: Degenerative changes seen throughout the lumbar spine. IMPRESSION: 1. Mild atelectasis and/or infiltrate within the bilateral lung bases with small bilateral pleural effusions. 2. Sigmoid diverticulosis. 3. Stable bilateral simple renal cysts. 4. Calcified uterine fibroids. 5. Aortic atherosclerosis. Aortic Atherosclerosis (ICD10-I70.0). Electronically Signed   By:  Virgina Norfolk M.D.   On: 06/23/2020 01:15   DG ABD ACUTE 2+V W 1V CHEST  Result Date: 06/22/2020 CLINICAL DATA:  Altered level of consciousness EXAM: DG ABDOMEN ACUTE WITH 1 VIEW CHEST COMPARISON:  06/07/2020 FINDINGS: Supine and upright frontal views of the abdomen as well as an upright frontal view of the chest are obtained. The cardiac silhouette is enlarged but stable. Increased central vascular congestion and diffuse interstitial prominence, with developing consolidation at the lung bases. Small left effusion. No pneumothorax. Bowel gas pattern is unremarkable without obstruction or ileus. No masses or abnormal calcifications. No free gas in the greater peritoneal sac. IMPRESSION: 1. Worsening volume status, with developing pulmonary edema. 2. Unremarkable bowel gas pattern. Electronically Signed   By: Randa Ngo M.D.   On: 06/22/2020 23:21   US THYROID  Result Date: 06/23/2020 CLINICAL DATA:  Incidental on CT. 79 year old female presenting with altered mental status and enlarged left thyroid noted on chest CT. History of left thyroid biopsy on 07/31/2014. EXAM: THYROID ULTRASOUND TECHNIQUE: Ultrasound examination of the thyroid gland and adjacent soft tissues was performed. COMPARISON:  07/16/2014, 07/31/2014, chest CT from 06/23/2020 FINDINGS: Parenchymal Echotexture: Markedly heterogenous Isthmus: 0.6 cm, previously 0.6 cm Right lobe: 4.7 x 2.0 x 1.8 cm, previously 5.4 x 2.1 x 1.6 cm Left lobe: 7.5 x 4.3 x 6.9 cm, previously 9.0 x 4.7 x 5.4 cm _________________________________________________________ Estimated total number of nodules >/= 1 cm: 0 Number of spongiform nodules >/=  2 cm not described below (TR1): 0 Number of mixed cystic and solid nodules >/= 1.5 cm not described below ([place]): 0 _________________________________________________________ No discrete nodules are seen within the thyroid gland. Per sonographer note, this was a limited exam due to patient's mental status. IMPRESSION:  Similar appearing diffusely heterogeneous thyroid gland. Asymmetrically enlarged left lobe without discrete nodule. Recommend correlation with prior biopsy results. Per sonographer note, this was a limited exam due to patient's mental status. The above is in keeping with the ACR TI-RADS recommendations - J Am Coll Radiol 2017;14:587-595. Ruthann Cancer, MD [facility] Electronically Signed   By: Ruthann Cancer MD   On: 06/23/2020 08:00   CT HEAD CODE STROKE WO CONTRAST  Result Date: 06/22/2020 CLINICAL DATA:  Code  stroke. Initial evaluation for neuro deficit, stroke suspected. EXAM: CT HEAD WITHOUT CONTRAST TECHNIQUE: Contiguous axial images were obtained from the base of the skull through the vertex without intravenous contrast. COMPARISON:  Prior MRI from 06/12/2020 FINDINGS: Brain: Examination mildly degraded by motion artifact. Age-related cerebral atrophy with chronic microvascular ischemic disease. Chronic right MCA distribution infarct involving the anterior right frontal lobe. No acute intracranial hemorrhage. No acute large vessel territory infarct. No mass lesion, midline shift or mass effect. No hydrocephalus or extra-axial fluid collection. Vascular: No hyperdense vessel. Scattered vascular calcifications noted within the carotid siphons. Skull: Scalp soft tissues and calvarium within normal limits. Sinuses/Orbits: Right gaze noted. Prior bilateral ocular lens replacement. Chronic right maxillary sinusitis. Mastoid air cells are clear. Other: None. ASPECTS [facility] Stroke Program Early CT Score) - Ganglionic level infarction (caudate, lentiform nuclei, internal capsule, insula, M1-M3 cortex): 7 - Supraganglionic infarction (M4-M6 cortex): 3 Total score (0-10 with 10 being normal): 10 IMPRESSION: 1. No acute intracranial infarct or other abnormality. 2. ASPECTS is 10. 3. Chronic right MCA distribution infarct. 4. Underlying atrophy with mild  chronic small vessel ischemic disease. 5. Chronic right maxillary sinusitis. These results were communicated to Dr. Cheral Marker at 8:27 [place] 12/19/2021by text page via the [facility] messaging system. Electronically Signed   By: Jeannine Boga M.D.   On: 06/22/2020 20:28   US Abdomen Limited RUQ (LIVER/GB)  Result Date: 06/23/2020 CLINICAL DATA:  Elevated LFTs.  Cholecystectomy. EXAM: ULTRASOUND ABDOMEN LIMITED RIGHT UPPER QUADRANT COMPARISON:  CT 06/23/2020. FINDINGS: Gallbladder: Cholecystectomy. Common bile duct: Diameter: 9.2 mm. Mild common bile duct dilatation can be seen following cholecystectomy. Liver: Mild increased echogenicity suggesting fatty infiltration cannot be excluded. No focal hepatic abnormality identified. Portal vein is patent on color Doppler imaging with normal direction of blood flow towards the liver. Other: Multiple renal cysts with thin septation again noted with the largest measuring 9.6 cm. Mild amount of debris may be within the cyst. The cysts are most likely benign. Similar findings noted on prior CT. IMPRESSION: 1. Cholecystectomy. Common bile duct measures 9.2 mm. Mild prominence of the common bile duct can be seen following cholecystectomy. Further evaluation of common bile duct dilatation can be performed with MRCP as needed. No intrahepatic biliary ductal dilatation. 2. Mild increased hepatic echogenicity suggesting fatty infiltration. No focal hepatic abnormality identified. Electronically Signed   By: [place]   On: 06/23/2020 07:24        Scheduled Meds: . allopurinol  200 mg Oral Daily  . apixaban  2.5 mg Oral BID  . atorvastatin  20 mg Oral QPM  . furosemide  40 mg Intravenous BID  . gabapentin  100 mg Oral QHS  . insulin aspart  0-9 Units Subcutaneous Q4H  . insulin glargine  14 Units Subcutaneous QHS  . isosorbide mononitrate  30 mg Oral Daily  . metoprolol succinate  50 mg Oral Daily  . mometasone-formoterol  2 puff Inhalation BID  .  montelukast  10 mg Oral QHS   Continuous Infusions: . sodium chloride 10 mL/hr at 06/24/20 0318  . azithromycin 500 mg (06/24/20 0425)  . cefTRIAXone (ROCEPHIN)  IV 1 g (06/24/20 0424)  . valproate sodium 250 mg (06/24/20 0601)     LOS: 1 day    Time spent: 35 minutes    Edwin Dada, MD [facility] 06/24/2020, 3:12 PM     Please page though [place] or Epic secure chat:  For password, contact charge nurse

## 2020-06-24 NOTE — Progress Notes (Signed)
Safety sitter at bedside. Restraints d/c'd.

## 2020-06-24 NOTE — Plan of Care (Signed)
  Problem: Nutrition: Goal: Adequate nutrition will be maintained Outcome: Progressing   Problem: Elimination: Goal: Will not experience complications related to bowel motility Outcome: Progressing   

## 2020-06-25 ENCOUNTER — Ambulatory Visit: Payer: Medicare Other | Admitting: Student

## 2020-06-25 LAB — GLUCOSE, CAPILLARY
Glucose-Capillary: 108 mg/dL — ABNORMAL HIGH (ref 70–99)
Glucose-Capillary: 144 mg/dL — ABNORMAL HIGH (ref 70–99)
Glucose-Capillary: 159 mg/dL — ABNORMAL HIGH (ref 70–99)
Glucose-Capillary: 160 mg/dL — ABNORMAL HIGH (ref 70–99)
Glucose-Capillary: 171 mg/dL — ABNORMAL HIGH (ref 70–99)

## 2020-06-25 LAB — BASIC METABOLIC PANEL
Anion gap: 11 (ref 5–15)
BUN: 15 mg/dL (ref 8–23)
CO2: 27 mmol/L (ref 22–32)
Calcium: 9.3 mg/dL (ref 8.9–10.3)
Chloride: 111 mmol/L (ref 98–111)
Creatinine, Ser: 1.89 mg/dL — ABNORMAL HIGH (ref 0.44–1.00)
GFR, Estimated: 27 mL/min — ABNORMAL LOW (ref >60)
Glucose, Bld: 131 mg/dL — ABNORMAL HIGH (ref 70–99)
Potassium: 3.2 mmol/L — ABNORMAL LOW (ref 3.5–5.1)
Sodium: 149 mmol/L — ABNORMAL HIGH (ref 135–145)

## 2020-06-25 LAB — CBC
HCT: 27.6 % — ABNORMAL LOW (ref 36.0–46.0)
Hemoglobin: 9.8 g/dL — ABNORMAL LOW (ref 12.0–15.0)
MCH: 27.2 pg (ref 26.0–34.0)
MCHC: 35.5 g/dL (ref 30.0–36.0)
MCV: 76.7 fL — ABNORMAL LOW (ref 80.0–100.0)
Platelets: 192 10*3/uL (ref 150–400)
RBC: 3.6 MIL/uL — ABNORMAL LOW (ref 3.87–5.11)
RDW: 17 % — ABNORMAL HIGH (ref 11.5–15.5)
WBC: 11.2 10*3/uL — ABNORMAL HIGH (ref 4.0–10.5)
nRBC: 1.7 % — ABNORMAL HIGH (ref 0.0–0.2)

## 2020-06-25 LAB — AMMONIA: Ammonia: 68 umol/L — ABNORMAL HIGH (ref 9–35)

## 2020-06-25 LAB — MAGNESIUM: Magnesium: 1.5 mg/dL — ABNORMAL LOW (ref 1.7–2.4)

## 2020-06-25 MED ORDER — MAGNESIUM SULFATE 4 GM/100ML IV SOLN
4.0000 g | Freq: Once | INTRAVENOUS | Status: AC
  Administered 2020-06-25: 16:00:00 4 g via INTRAVENOUS
  Filled 2020-06-25: qty 100

## 2020-06-25 MED ORDER — POTASSIUM CHLORIDE CRYS ER 20 MEQ PO TBCR
40.0000 meq | EXTENDED_RELEASE_TABLET | ORAL | Status: AC
  Administered 2020-06-25 (×2): 40 meq via ORAL
  Filled 2020-06-25 (×2): qty 2

## 2020-06-25 MED ORDER — AZITHROMYCIN 250 MG PO TABS
500.0000 mg | ORAL_TABLET | Freq: Every day | ORAL | Status: DC
  Administered 2020-06-26: 06:00:00 500 mg via ORAL
  Filled 2020-06-25: qty 2

## 2020-06-25 MED ORDER — INSULIN ASPART 100 UNIT/ML ~~LOC~~ SOLN
0.0000 [IU] | Freq: Three times a day (TID) | SUBCUTANEOUS | Status: DC
  Administered 2020-06-25 – 2020-06-26 (×2): 2 [IU] via SUBCUTANEOUS
  Administered 2020-06-26 – 2020-06-27 (×2): 1 [IU] via SUBCUTANEOUS

## 2020-06-25 MED ORDER — LORAZEPAM 2 MG/ML IJ SOLN
1.0000 mg | Freq: Once | INTRAMUSCULAR | Status: AC
  Administered 2020-06-25: 23:00:00 1 mg via INTRAVENOUS
  Filled 2020-06-25: qty 1

## 2020-06-25 MED ORDER — LORAZEPAM 2 MG/ML IJ SOLN: 2.0000 mg | Freq: Once | INTRAMUSCULAR | Status: DC

## 2020-06-25 NOTE — Progress Notes (Signed)
Patient converted to Afib. Hr currently 115. Patient asymptomatic. Dr Hal Hope paged and notified. Awaiting new orders. Will continue to monitor pateint

## 2020-06-25 NOTE — Progress Notes (Signed)
PHARMACIST - PHYSICIAN COMMUNICATION DR:   Wynelle Cleveland CONCERNING: Antibiotic IV to Oral Route Change Policy  RECOMMENDATION: This patient is receiving azithromycin by the intravenous route.  Based on criteria approved by the Pharmacy and Therapeutics Committee, the antibiotic(s) is/are being converted to the equivalent oral dose form(s).   DESCRIPTION: These criteria include:  Patient being treated for a respiratory tract infection, urinary tract infection, cellulitis or clostridium difficile associated diarrhea if on metronidazole  The patient is not neutropenic and does not exhibit a GI malabsorption state  The patient is eating (either orally or via tube) and/or has been taking other orally administered medications for a least 24 hours  The patient is improving clinically and has a Tmax < 100.5  If you have questions about this conversion, please contact the Pharmacy Department  []   [phone] )  [facility] []   [phone] )  [facility] [x]   [phone] )  [facility] []   [phone] )  [facility] []   [phone] )  [facility]

## 2020-06-25 NOTE — Consult Note (Signed)
[facility] Inpatient Consult   06/25/2020  Brittany Roberts 1941/05/22 [id number]  [facility] [[facility]]  Accountable Care Organization [ACO] Patient: [name]   Patient screened for extreme high risk score for unplanned readmission risk with less than 7 days readmission hospitalization.  Review reveals patient is from a skilled facility for short term care.  Primary Care Provider is [place], [name], MD this provider is listed to provide the transition of care [TOC] for post hospital follow up.   Plan:  Current disposition is to return to a skilled nursing level of care. Continue to follow progress and disposition to assess for post hospital care management needs.  Patient needs to be met at the skilled facility.  If disposition changes will follow for community care/disease management.  Please place a [facility] Care Management consult as appropriate and for questions contact:   Charlesetta Shanks, RN BSN CCM [facility]  [phone] business mobile phone Toll free office [phone]  Fax number: [phone] [email or web address]

## 2020-06-25 NOTE — Progress Notes (Signed)
PROGRESS NOTE    MARIT GOODWILL   LTJ:[id number]  DOB: [birth year]  DOA: 06/22/2020 PCP: [name], [name], MD   Brief Narrative:  Brittany Roberts is a 79 y.o. F with dementia, home dwelling, SLE not on DMARD, dCHF and COPD on 3L home O2, CAD, obesity, OSA not on CPAP, HTN, hypothyroidism, history stroke, DM, seizures, CKD IIIb baseline 1.5 and recent new diagnosis apical thrombus and atrial fibrillation on anticoagulation who presents with confusion.  She was admitted 2 weeks ago for encephalopathy thought to be from UTI in setting of dementia.  She was treated with fluids and Rocephin and discharged to SNF.  During that hospital stay.  Since going to SNF, the patient gradually worsened, became more somnolent & sluggish.  Daughter thought this was triggered by new start Depakote.  Per daughter the patient was conversant and following commands up until 2PM on the day of admission (per neuro consult note). In the ED she was also found to have tachypnea, hypoxia and was on 4 L o2, WBC of 13, ammonia level of 71, CXR consistent with pulm edema and CT with RUL and basilar atelectasis or infiltrate. BNP was 1725.  Subjective: Quite sleepy this AM. She is able to awaken easily and has no complaints but falls back asleep. She is disoriented and restless.      Assessment & Plan:   Principal Problem:   Acute encephalopathy with underlying dementia - Depakote has been stopped - 12/19> Neuro consulted - MRI brain 12/20> no evidence of acute infarct - EEG 12/21> moderate diffuse encephalopathy - Neuro started her on Keppra on 12/19 however no diagnosis of seizures has been made by neurology - I will stop Keppra today as her decline in mental status dose not seem to be improving from it and likely was not related to a seizure but more likely related to advancing dementia - follow mental status after stopping Keppra to see if she is less lethargic - can continue 100 mg of Neurontin at bedtime  Active  Problems: Acute on chronic hypoxic respiratory failure- at 3 L o2 at baseline and 4 L in ED - Thought to be due to CHF exacerbation and PNA - currently receiving Ceftriaxone and Azithromycin for atelectasis vs infiltrates on CT scan - plan to stop on 12/23 (5 days) - currently on 3 L with pulse ox 100% - d/c IV diuresis as Cr is rising and she appears euvolemic  Hypokalemia and hypomagnesemia - replacing today - recheck tomorrow    COPD (chronic obstructive pulmonary disease) - No exacerbation noted  A-fib - receiving Eliquis and Toprol    New Lung mass noted on CT - pulmonary consulted- the patient's daughter, [name], has opted to pursue a non-aggressive approach  IDDM - on Lantus and SSI to TID with meals rather that Q 4 hrs   CKD 3b-4 - follow- Cr up to 1.89 today- stop Lasix   Time spent in minutes:  35 min DVT prophylaxis: Eliquis Code Status: DNR Family Communication:  Disposition Plan:  Status is: Inpatient  Remains inpatient appropriate because:IV treatments appropriate due to intensity of illness or inability to take PO and confusion.   Dispo: The patient is from: SNF              Anticipated d/c is to: SNF              Anticipated d/c date is: 3 days  Patient currently is not medically stable to d/c.      Consultants:   Neurology  PCCM Procedures:   EEG Antimicrobials:  Anti-infectives (From admission, onward)   Start     Dose/Rate Route Frequency Ordered Stop   06/26/20 0700  azithromycin (ZITHROMAX) tablet 500 mg        500 mg Oral Daily with breakfast 06/25/20 0852     06/23/20 0500  cefTRIAXone (ROCEPHIN) 1 g in sodium chloride 0.9 % 100 mL IVPB        1 g 200 mL/hr over 30 Minutes Intravenous Every 24 hours 06/23/20 0456     06/23/20 0500  azithromycin (ZITHROMAX) 500 mg in sodium chloride 0.9 % 250 mL IVPB  Status:  Discontinued        500 mg 250 mL/hr over 60 Minutes Intravenous Every 24 hours 06/23/20 0456 06/25/20 0852        Objective: Vitals:   06/25/20 0735 06/25/20 1042 06/25/20 1051 06/25/20 1134  BP:  (!) 142/76 (!) 142/84 (!) 146/67  Pulse:  80 64 63  Resp:    18  Temp:    98.7 F (37.1 C)  TempSrc:    Axillary  SpO2: 98%   100%  Weight:      Height:        Intake/Output Summary (Last 24 hours) at 06/25/2020 1408 Last data filed at 06/25/2020 1100 Gross per 24 hour  Intake 240 ml  Output 1400 ml  Net -1160 ml   Filed Weights   06/24/20 0032 06/25/20 0418  Weight: 125.7 kg 126.3 kg    Examination: General exam: Appears comfortable  HEENT: PERRLA, oral mucosa dry, no sclera icterus or thrush Respiratory system: Clear to auscultation. Respiratory effort normal. Cardiovascular system: S1 & S2 heard, RRR.   Gastrointestinal system: Abdomen soft, non-tender, nondistended. Normal bowel sounds. Central nervous system: Alert -not oriented - does not follow command- unable to comply with CNS exam Extremities: No cyanosis, clubbing or edema Skin: No rashes or ulcers Psychiatry:  Cannot assess- confused and sleepy today    Data Reviewed: I have personally reviewed following labs and imaging studies  CBC: Recent Labs  Lab 06/22/20 1913 06/22/20 2029 06/22/20 2035 06/22/20 2111 06/23/20 0921 06/24/20 0255 06/25/20 0300  WBC 13.0* 12.9*  --   --  11.5* 12.9* 11.2*  NEUTROABS  --  8.8*  --   --   --   --   --   HGB 10.5* 9.8* 11.2* 10.9* 10.2* 10.0* 9.8*  HCT 31.9* 29.0* 33.0* 32.0* 30.7* 28.7* 27.6*  MCV 78.0* 76.9*  --   --  78.5* 76.3* 76.7*  PLT 202 184  --   --  193 174 706   Basic Metabolic Panel: Recent Labs  Lab 06/22/20 1913 06/22/20 2035 06/22/20 2111 06/23/20 0921 06/24/20 0255 06/25/20 0300  NA 142 147* 147* 144 146* 149*  K 4.0 4.0 3.7 4.0 3.6 3.2*  CL 112* 114*  --  110 111 111  CO2 20*  --   --  24 25 27   GLUCOSE 262* 220*  --  172* 173* 131*  BUN 15 16  --  15 16 15   CREATININE 1.64* 1.40*  --  1.69* 1.68* 1.89*  CALCIUM 9.8  --   --  9.5 9.6 9.3   MG  --   --   --   --   --  1.5*   GFR: Estimated Creatinine Clearance: 31 mL/min (A) (by C-G formula based on SCr  of 1.89 mg/dL (H)). Liver Function Tests: Recent Labs  Lab 06/22/20 1913 06/23/20 0921  AST 56* 48*  ALT 46* 43  ALKPHOS 94 89  BILITOT 1.2 0.9  PROT 7.2 6.8  ALBUMIN 3.1* 3.0*   No results for input(s): LIPASE, AMYLASE in the last 168 hours. Recent Labs  Lab 06/22/20 2231 06/23/20 0921 06/25/20 0300  AMMONIA 71* 25 68*   Coagulation Profile: Recent Labs  Lab 06/22/20 2029  INR 1.5*   Cardiac Enzymes: No results for input(s): CKTOTAL, CKMB, CKMBINDEX, TROPONINI in the last 168 hours. BNP (last 3 results) No results for input(s): PROBNP in the last 8760 hours. HbA1C: No results for input(s): HGBA1C in the last 72 hours. CBG: Recent Labs  Lab 06/24/20 1617 06/24/20 1937 06/24/20 2333 06/25/20 0415 06/25/20 0752  GLUCAP 136* 148* 233* 108* 144*   Lipid Profile: No results for input(s): CHOL, HDL, LDLCALC, TRIG, CHOLHDL, LDLDIRECT in the last 72 hours. Thyroid Function Tests: Recent Labs    06/22/20 2231  TSH 0.840   Anemia Panel: Recent Labs    06/24/20 0255  VITAMINB12 2,994*   Urine analysis:    Component Value Date/Time   COLORURINE AMBER (A) 06/23/2020 0055   APPEARANCEUR HAZY (A) 06/23/2020 0055   LABSPEC >1.046 (H) 06/23/2020 0055   PHURINE 5.0 06/23/2020 0055   GLUCOSEU NEGATIVE 06/23/2020 0055   HGBUR MODERATE (A) 06/23/2020 0055   HGBUR negative 02/26/2009 1314   BILIRUBINUR NEGATIVE 06/23/2020 0055   BILIRUBINUR NEG 04/05/2014 1455   KETONESUR NEGATIVE 06/23/2020 0055   PROTEINUR >=300 (A) 06/23/2020 0055   UROBILINOGEN 0.2 08/01/2014 2305   NITRITE NEGATIVE 06/23/2020 0055   LEUKOCYTESUR NEGATIVE 06/23/2020 0055   Sepsis Labs: @LABRCNTIP (procalcitonin:4,lacticidven:4) ) Recent Results (from the past 240 hour(s))  SARS Coronavirus 2 by RT PCR (hospital order, performed in [place] hospital lab) Nasopharyngeal  Nasopharyngeal Swab     Status: None   Collection Time: 06/16/20  3:20 PM   Specimen: Nasopharyngeal Swab  Result Value Ref Range Status   SARS Coronavirus 2 NEGATIVE NEGATIVE Final    Comment: (NOTE) SARS-CoV-2 target nucleic acids are NOT DETECTED.  The SARS-CoV-2 RNA is generally detectable in upper and lower respiratory specimens during the acute phase of infection. The lowest concentration of SARS-CoV-2 viral copies this assay can detect is 250 copies / mL. A negative result does not preclude SARS-CoV-2 infection and should not be used as the sole basis for treatment or other patient management decisions.  A negative result may occur with improper specimen collection / handling, submission of specimen other than nasopharyngeal swab, presence of viral mutation(s) within the areas targeted by this assay, and inadequate number of viral copies (<250 copies / mL). A negative result must be combined with clinical observations, patient history, and epidemiological information.  Fact Sheet for Patients:   StrictlyIdeas.no  Fact Sheet for Healthcare Providers: BankingDealers.co.za  This test is not yet approved or  cleared by the [place] FDA and has been authorized for detection and/or diagnosis of SARS-CoV-2 by FDA under an Emergency Use Authorization (EUA).  This EUA will remain in effect (meaning this test can be used) for the duration of the COVID-19 declaration under Section 564(b)(1) of the Act, 21 U.S.C. section 360bbb-3(b)(1), unless the authorization is terminated or revoked sooner.  Performed at [facility], [place] [address] [place], [place] [address]   Resp Panel by RT-PCR (Flu A&B, Covid) Nasopharyngeal Swab     Status: None   Collection Time:  06/22/20 10:56 PM   Specimen: Nasopharyngeal Swab; Nasopharyngeal(NP) swabs in vial transport medium  Result Value Ref Range Status   SARS Coronavirus 2 by RT PCR  NEGATIVE NEGATIVE Final    Comment: (NOTE) SARS-CoV-2 target nucleic acids are NOT DETECTED.  The SARS-CoV-2 RNA is generally detectable in upper respiratory specimens during the acute phase of infection. The lowest concentration of SARS-CoV-2 viral copies this assay can detect is 138 copies/mL. A negative result does not preclude SARS-Cov-2 infection and should not be used as the sole basis for treatment or other patient management decisions. A negative result may occur with  improper specimen collection/handling, submission of specimen other than nasopharyngeal swab, presence of viral mutation(s) within the areas targeted by this assay, and inadequate number of viral copies(<138 copies/mL). A negative result must be combined with clinical observations, patient history, and epidemiological information. The expected result is Negative.  Fact Sheet for Patients:  EntrepreneurPulse.com.au  Fact Sheet for Healthcare Providers:  IncredibleEmployment.be  This test is no t yet approved or cleared by the [place] FDA and  has been authorized for detection and/or diagnosis of SARS-CoV-2 by FDA under an Emergency Use Authorization (EUA). This EUA will remain  in effect (meaning this test can be used) for the duration of the COVID-19 declaration under Section 564(b)(1) of the Act, 21 U.S.C.section 360bbb-3(b)(1), unless the authorization is terminated  or revoked sooner.       Influenza A by PCR NEGATIVE NEGATIVE Final   Influenza B by PCR NEGATIVE NEGATIVE Final    Comment: (NOTE) The Xpert Xpress SARS-CoV-2/FLU/RSV plus assay is intended as an aid in the diagnosis of influenza from Nasopharyngeal swab specimens and should not be used as a sole basis for treatment. Nasal washings and aspirates are unacceptable for Xpert Xpress SARS-CoV-2/FLU/RSV testing.  Fact Sheet for Patients: EntrepreneurPulse.com.au  Fact Sheet for  Healthcare Providers: IncredibleEmployment.be  This test is not yet approved or cleared by the [place] FDA and has been authorized for detection and/or diagnosis of SARS-CoV-2 by FDA under an Emergency Use Authorization (EUA). This EUA will remain in effect (meaning this test can be used) for the duration of the COVID-19 declaration under Section 564(b)(1) of the Act, 21 U.S.C. section 360bbb-3(b)(1), unless the authorization is terminated or revoked.  Performed at [facility], [place] [address] [place], [place] [address]   Urine Culture     Status: None   Collection Time: 06/23/20 12:47 AM   Specimen: Urine, Catheterized  Result Value Ref Range Status   Specimen Description URINE, CATHETERIZED  Final   Special Requests NONE  Final   Culture   Final    NO GROWTH Performed at [facility], [address] [place], [place] [address]    Report Status 06/23/2020 FINAL  Final  Culture, blood (routine x 2)     Status: None (Preliminary result)   Collection Time: 06/23/20  9:21 AM   Specimen: BLOOD  Result Value Ref Range Status   Specimen Description BLOOD RIGHT ANTECUBITAL  Final   Special Requests   Final    BOTTLES DRAWN AEROBIC AND ANAEROBIC Blood Culture adequate volume   Culture   Final    NO GROWTH 2 DAYS Performed at [facility], [place] [address], [place], [place] [address]    Report Status PENDING  Incomplete         Radiology Studies: No results found.    Scheduled Meds:  allopurinol  200 mg Oral Daily  amLODipine  2.5 mg Oral Daily   apixaban  2.5 mg Oral BID   atorvastatin  20 mg Oral QPM   [START ON 06/26/2020] azithromycin  500 mg Oral Q breakfast   gabapentin  100 mg Oral QHS   insulin aspart  0-9 Units Subcutaneous Q4H   insulin glargine  14 Units Subcutaneous QHS   isosorbide mononitrate  30 mg Oral Daily   levETIRAcetam  500 mg Oral BID   metoprolol succinate  50 mg Oral Daily    mometasone-formoterol  2 puff Inhalation BID   montelukast  10 mg Oral QHS   Continuous Infusions:  sodium chloride 250 mL (06/25/20 0501)   cefTRIAXone (ROCEPHIN)  IV 1 g (06/25/20 0502)     LOS: 2 days      Debbe Odea, MD [facility] Pager: [email or web address] 06/25/2020, 2:08 PM

## 2020-06-25 NOTE — TOC Progression Note (Signed)
Transition of Care [facility]) - Progression Note    Patient Details  Name: SEMONE ORLOV MRN: [id number] Date of Birth: 01/11/1941  Transition of Care [facility]) CM/SW [place], [place] Phone Number: 06/25/2020, 3:36 PM  Clinical Narrative:     CSW spoke with pt daughter [name] bedside. [name] is wanting to see if [facility] ALF would take pt straight from hospital. CSW called and is notified they would not and pt would be more appropriate for SNF prior to ALF. CSW provided pt daughter with offers. She inquires about [facility] which was not on list of offers. CSW contacted Andree Elk who explained if pt is discharging on Abx and pnemonia then pt would be required to be quarantined and that they do not have any quarantine beds currently. CSW to follow up on pt choice.   Expected Discharge Plan: Skilled Nursing Facility Barriers to Discharge: Continued Medical Work up  Expected Discharge Plan and Services Expected Discharge Plan: [place]                                               Social Determinants of Health (SDOH) Interventions    Readmission Risk Interventions No flowsheet data found.

## 2020-06-25 NOTE — Plan of Care (Signed)
  Problem: Education: Goal: Knowledge of General Education information will improve Description: Including pain rating scale, medication(s)/side effects and non-pharmacologic comfort measures 06/25/2020 1853 by Rolm Baptise, RN Outcome: Progressing 06/25/2020 1853 by Rolm Baptise, RN Outcome: Progressing   Problem: Health Behavior/Discharge Planning: Goal: Ability to manage health-related needs will improve 06/25/2020 1853 by Rolm Baptise, RN Outcome: Progressing 06/25/2020 1853 by Rolm Baptise, RN Outcome: Progressing   Problem: Clinical Measurements: Goal: Ability to maintain clinical measurements within normal limits will improve 06/25/2020 1853 by Rolm Baptise, RN Outcome: Progressing 06/25/2020 1853 by Rolm Baptise, RN Outcome: Progressing Goal: Will remain free from infection 06/25/2020 1853 by Rolm Baptise, RN Outcome: Progressing 06/25/2020 1853 by Rolm Baptise, RN Outcome: Progressing Goal: Diagnostic test results will improve 06/25/2020 1853 by Rolm Baptise, RN Outcome: Progressing 06/25/2020 1853 by Karyl Kinnier D, RN Outcome: Progressing Goal: Respiratory complications will improve 06/25/2020 1853 by Rolm Baptise, RN Outcome: Progressing 06/25/2020 1853 by Karyl Kinnier D, RN Outcome: Progressing Goal: Cardiovascular complication will be avoided 06/25/2020 1853 by Rolm Baptise, RN Outcome: Progressing 06/25/2020 1853 by Rolm Baptise, RN Outcome: Progressing   Problem: Activity: Goal: Risk for activity intolerance will decrease 06/25/2020 1853 by Rolm Baptise, RN Outcome: Progressing 06/25/2020 1853 by Rolm Baptise, RN Outcome: Progressing   Problem: Nutrition: Goal: Adequate nutrition will be maintained 06/25/2020 1853 by Rolm Baptise, RN Outcome: Progressing 06/25/2020 1853 by Karyl Kinnier D, RN Outcome: Progressing   Problem: Coping: Goal: Level of anxiety will decrease 06/25/2020 1853 by Rolm Baptise, RN Outcome: Progressing 06/25/2020 1853 by Rolm Baptise, RN Outcome: Progressing   Problem: Elimination: Goal: Will not experience complications related to bowel motility 06/25/2020 1853 by Rolm Baptise, RN Outcome: Progressing 06/25/2020 1853 by Rolm Baptise, RN Outcome: Progressing Goal: Will not experience complications related to urinary retention 06/25/2020 1853 by Rolm Baptise, RN Outcome: Progressing 06/25/2020 1853 by Rolm Baptise, RN Outcome: Progressing   Problem: Pain Managment: Goal: General experience of comfort will improve 06/25/2020 1853 by Rolm Baptise, RN Outcome: Progressing 06/25/2020 1853 by Rolm Baptise, RN Outcome: Progressing   Problem: Safety: Goal: Ability to remain free from injury will improve 06/25/2020 1853 by Rolm Baptise, RN Outcome: Progressing 06/25/2020 1853 by Karyl Kinnier D, RN Outcome: Progressing   Problem: Skin Integrity: Goal: Risk for impaired skin integrity will decrease 06/25/2020 1853 by Rolm Baptise, RN Outcome: Progressing 06/25/2020 1853 by Rolm Baptise, RN Outcome: Progressing

## 2020-06-25 NOTE — Evaluation (Signed)
Physical Therapy Evaluation Patient Details Name: Brittany Roberts MRN: [id number] DOB: 10/12/1940 Today's Date: 06/25/2020   History of Present Illness  79 yo female presenting from SNF via EMS with AMS. CTA chest ruled out PE, confirmed edema, and also showed new lung mass. CT head neg. Pt with recent admission 06/07/2020-06/17/2020 for acute metabolic encephalopathy secondary to UTI. PMH including CAD s/p PCI, morbid obesity, chronic diastolic CHF, chronic hypoxic respiratory failure on 3 L home oxygen, OSA not on CPAP, HTN, hyperlipidemia, hypothyroidism, bipolar disorder, depression, fibromyalgia, CVA, GERD, gout, sickle cell trait, lupus, type 2 diabetes, peripheral neuropathy, seizure disorder, COPD, CKD stage IIIb, history of recent LV apical thrombus treated with 3 months of anticoagulation, Covid infection in March 2021.  Clinical Impression  Prior to admission, pt at SNF and requiring assist for mobility and ADL's. Pt currently requiring two person maximal assist for all aspects of bed mobility. Able to participate in bed level exercises for BUE/BLE strengthening. Recommending SNF at discharge to maximize functional mobility and decrease caregiver burden.     Follow Up Recommendations SNF    Equipment Recommendations  None recommended by PT    Recommendations for Other Services       Precautions / Restrictions Precautions Precautions: Fall Precaution Comments: on chronic O2 Restrictions Weight Bearing Restrictions: No      Mobility  Bed Mobility Overal bed mobility: Needs Assistance Bed Mobility: Rolling;Supine to Sit;Sit to Supine Rolling: Max assist;+2 for physical assistance   Supine to sit: Max assist;+2 for physical assistance Sit to supine: Max assist;+2 for physical assistance   General bed mobility comments: MaxA + 2 for all aspects of bed moblity, pt with little initiation    Transfers                    Ambulation/Gait                Stairs             Wheelchair Mobility    Modified Rankin (Stroke Patients Only)       Balance Overall balance assessment: Needs assistance Sitting-balance support: Feet unsupported;Bilateral upper extremity supported Sitting balance-Leahy Scale: Poor Sitting balance - Comments: reliant on BUE support, min guard for safety                                     Pertinent Vitals/Pain Pain Assessment: Faces Faces Pain Scale: Hurts little more Pain Location: generalized with rolling Pain Descriptors / Indicators: Discomfort;Sore Pain Intervention(s): Monitored during session    Home Living Family/patient expects to be discharged to:: Skilled nursing facility                 Additional Comments: Family does not want to return to [facility]    Prior Function Level of Independence: Needs assistance         Comments: From chart review, pt was at [facility] and required assistance for ADLs. Prior to SNF rehab, pt was walkign with RW.     Hand Dominance   Dominant Hand: Right    Extremity/Trunk Assessment   Upper Extremity Assessment Upper Extremity Assessment: Defer to OT evaluation    Lower Extremity Assessment Lower Extremity Assessment: Generalized weakness    Cervical / Trunk Assessment Cervical / Trunk Assessment: Other exceptions Cervical / Trunk Exceptions: incr body habitus  Communication   Communication: HOH  Cognition Arousal/Alertness: Awake/alert Behavior During Therapy:  WFL for tasks assessed/performed Overall Cognitive Status: History of cognitive impairments - at baseline Area of Impairment: Following commands;Problem solving;Attention;Memory;Safety/judgement;Awareness;Orientation                 Orientation Level: Disoriented to;Situation;Time;Place Current Attention Level: Sustained Memory: Decreased short-term memory;Decreased recall of precautions Following Commands: Follows one step commands inconsistently;Follows one step  commands with increased time Safety/Judgement: Decreased awareness of safety;Decreased awareness of deficits Awareness: Intellectual Problem Solving: Decreased initiation;Slow processing;Difficulty sequencing;Requires verbal cues;Requires tactile cues General Comments: Pleasantly confused      General Comments      Exercises General Exercises - Upper Extremity Shoulder Flexion: AAROM;Both;10 reps;Supine Shoulder Extension: AAROM;Both;10 reps;Supine Elbow Flexion: AAROM;Both;10 reps;Supine Elbow Extension: AAROM;Both;10 reps;Supine General Exercises - Lower Extremity Ankle Circles/Pumps: PROM;Both;10 reps;Supine Long Arc Quad: AAROM;Both;10 reps;Seated Heel Slides: AAROM;Both;10 reps;Supine   Assessment/Plan    PT Assessment Patient needs continued PT services  PT Problem List Decreased strength;Decreased range of motion;Decreased activity tolerance;Decreased balance;Decreased mobility;Decreased cognition;Decreased knowledge of use of DME;Decreased safety awareness;Decreased knowledge of precautions;Obesity       PT Treatment Interventions DME instruction;Gait training;Functional mobility training;Therapeutic activities;Therapeutic exercise;Balance training;Neuromuscular re-education;Cognitive remediation;Patient/family education    PT Goals (Current goals can be found in the Care Plan section)  Acute Rehab PT Goals Patient Stated Goal: Go to a different rehab PT Goal Formulation: With patient/family Time For Goal Achievement: 07/09/20 Potential to Achieve Goals: Fair    Frequency Min 2X/week   Barriers to discharge        Co-evaluation               AM-PAC PT "6 Clicks" Mobility  Outcome Measure Help needed turning from your back to your side while in a flat bed without using bedrails?: Total Help needed moving from lying on your back to sitting on the side of a flat bed without using bedrails?: Total Help needed moving to and from a bed to a chair (including a  wheelchair)?: Total Help needed standing up from a chair using your arms (e.g., wheelchair or bedside chair)?: Total Help needed to walk in hospital room?: Total Help needed climbing 3-5 steps with a railing? : Total 6 Click Score: 6    End of Session Equipment Utilized During Treatment: Oxygen Activity Tolerance: Patient limited by fatigue Patient left: with call bell/phone within reach;with family/visitor present;in chair;with chair alarm set Nurse Communication: Mobility status PT Visit Diagnosis: Unsteadiness on feet (R26.81);Other abnormalities of gait and mobility (R26.89);Muscle weakness (generalized) (M62.81)    Time: 2683-4196 PT Time Calculation (min) (ACUTE ONLY): 35 min   Charges:   PT Evaluation $PT Eval Moderate Complexity: 1 Mod PT Treatments $Therapeutic Exercise: 8-22 mins        Brittany Roberts, PT, DPT Acute Rehabilitation Services Pager [phone] Office [phone]   Deno Etienne 06/25/2020, 12:55 PM

## 2020-06-26 LAB — CBC
HCT: 27.3 % — ABNORMAL LOW (ref 36.0–46.0)
Hemoglobin: 9.7 g/dL — ABNORMAL LOW (ref 12.0–15.0)
MCH: 26.9 pg (ref 26.0–34.0)
MCHC: 35.5 g/dL (ref 30.0–36.0)
MCV: 75.8 fL — ABNORMAL LOW (ref 80.0–100.0)
Platelets: 167 10*3/uL (ref 150–400)
RBC: 3.6 MIL/uL — ABNORMAL LOW (ref 3.87–5.11)
RDW: 16.7 % — ABNORMAL HIGH (ref 11.5–15.5)
WBC: 10.2 10*3/uL (ref 4.0–10.5)
nRBC: 0.8 % — ABNORMAL HIGH (ref 0.0–0.2)

## 2020-06-26 LAB — GLUCOSE, CAPILLARY
Glucose-Capillary: 139 mg/dL — ABNORMAL HIGH (ref 70–99)
Glucose-Capillary: 139 mg/dL — ABNORMAL HIGH (ref 70–99)
Glucose-Capillary: 163 mg/dL — ABNORMAL HIGH (ref 70–99)
Glucose-Capillary: 118 mg/dL — ABNORMAL HIGH (ref 70–99)
Glucose-Capillary: 193 mg/dL — ABNORMAL HIGH (ref 70–99)

## 2020-06-26 LAB — BASIC METABOLIC PANEL
Anion gap: 11 (ref 5–15)
BUN: 21 mg/dL (ref 8–23)
CO2: 23 mmol/L (ref 22–32)
Calcium: 9 mg/dL (ref 8.9–10.3)
Chloride: 110 mmol/L (ref 98–111)
Creatinine, Ser: 2.34 mg/dL — ABNORMAL HIGH (ref 0.44–1.00)
GFR, Estimated: 21 mL/min — ABNORMAL LOW (ref >60)
Glucose, Bld: 163 mg/dL — ABNORMAL HIGH (ref 70–99)
Potassium: 4.2 mmol/L (ref 3.5–5.1)
Sodium: 144 mmol/L (ref 135–145)

## 2020-06-26 LAB — MAGNESIUM: Magnesium: 2.7 mg/dL — ABNORMAL HIGH (ref 1.7–2.4)

## 2020-06-26 MED ORDER — BOOST / RESOURCE BREEZE PO LIQD CUSTOM
1.0000 | Freq: Three times a day (TID) | ORAL | Status: DC
  Administered 2020-06-26 – 2020-06-27 (×3): 1 via ORAL

## 2020-06-26 MED ORDER — SODIUM CHLORIDE 0.9 % IV SOLN: INTRAVENOUS | Status: AC

## 2020-06-26 MED ORDER — ENSURE ENLIVE PO LIQD
237.0000 mL | Freq: Three times a day (TID) | ORAL | Status: DC
  Administered 2020-06-26 – 2020-06-27 (×3): 237 mL via ORAL

## 2020-06-26 MED ORDER — SODIUM CHLORIDE 0.9 % IV BOLUS
1000.0000 mL | Freq: Once | INTRAVENOUS | Status: AC
  Administered 2020-06-26: 08:00:00 1000 mL via INTRAVENOUS

## 2020-06-26 NOTE — Progress Notes (Signed)
PROGRESS NOTE    Brittany Roberts   FOY:[id number]  DOB: 1940/09/13  DOA: 06/22/2020 PCP: [name], [name], MD   Brief Narrative:  Brittany Roberts is a 79 y.o. F with dementia, home dwelling, SLE not on DMARD, dCHF and COPD on 3L home O2, CAD, obesity, OSA not on CPAP, HTN, hypothyroidism, history stroke, DM, seizures, CKD IIIb baseline 1.5 and recent new diagnosis apical thrombus and atrial fibrillation on anticoagulation who presents with confusion.  She was admitted 2 weeks ago for encephalopathy thought to be from UTI in setting of dementia.  She was treated with fluids and Rocephin and discharged to SNF.  During that hospital stay.  Since going to SNF, the patient gradually worsened, became more somnolent & sluggish.  Daughter thought this was triggered by new start Depakote.  Per daughter the patient was conversant and following commands up until 2PM on the day of admission (per neuro consult note). In the ED she was also found to have tachypnea, hypoxia and was on 4 L o2, WBC of 13, ammonia level of 71, CXR consistent with pulm edema and CT with RUL and basilar atelectasis or infiltrate. BNP was 1725.  Subjective: Quite sleepy again today. She awakens but does not try to answer my questions.     Assessment & Plan:   Principal Problem:   Acute encephalopathy with underlying dementia - Depakote has been stopped - 12/19> Neuro consulted - MRI brain 12/20> no evidence of acute infarct - EEG 12/21> moderate diffuse encephalopathy - Neuro started her on Keppra on 12/19 however no diagnosis of seizures has been made by neurology - I will stop Keppra today as her decline in mental status dose not seem to be improving from it and likely was not related to a seizure but more likely related to advancing dementia - following mental status after stopping Keppra to see if she is less lethargic - can continue 100 mg of Neurontin at bedtime - I am not able to see any change in mental status from  yesterday - continue to follow  Active Problems: Poor oral intake - poor solid intake but able to drink quite well per her daughter - Cont Ensure/ Boost 3-4 x day and follow oral intake  Acute on chronic hypoxic respiratory failure- at 3 L o2 at baseline and 4 L in ED - Thought to be due to CHF exacerbation and PNA - she has been receiving Ceftriaxone and Azithromycin for atelectasis vs infiltrates on CT scan - plan to stop after today's doses ( 5 days) - currently on 3 L O 2 (which is her baseline) with pulse ox 100% - 12/22> d/c IV diuresis as Cr is rising and she appears euvolemic  AKI on CKD 3b - baseline Cr 1.4-1.6 -  Cr has risen today to 2.34 despite holding lasix yesterday- will give IVF today and recheck tomorrow  Hypokalemia and hypomagnesemia - replaced- stable today    COPD (chronic obstructive pulmonary disease) - No exacerbation noted  A-fib - receiving Eliquis and Toprol    New Lung mass noted on CT - pulmonary consulted- the patient's daughter, [name], has opted to pursue a non-aggressive approach  IDDM - on Lantus and SSI TID with meals     Time spent in minutes:  35 min DVT prophylaxis: Eliquis Code Status: DNR Family Communication:  Disposition Plan:  Status is: Inpatient  Remains inpatient appropriate because:IV treatments appropriate due to intensity of illness or inability to take PO and confusion.  Dispo: The patient is from: SNF              Anticipated d/c is to: SNF              Anticipated d/c date is: 3 days              Patient currently is not medically stable to d/c.  Consultants:   Neurology  PCCM Procedures:   EEG Antimicrobials:  Anti-infectives (From admission, onward)   Start     Dose/Rate Route Frequency Ordered Stop   06/26/20 0700  azithromycin (ZITHROMAX) tablet 500 mg        500 mg Oral Daily with breakfast 06/25/20 0852     06/23/20 0500  cefTRIAXone (ROCEPHIN) 1 g in sodium chloride 0.9 % 100 mL IVPB        1  g 200 mL/hr over 30 Minutes Intravenous Every 24 hours 06/23/20 0456     06/23/20 0500  azithromycin (ZITHROMAX) 500 mg in sodium chloride 0.9 % 250 mL IVPB  Status:  Discontinued        500 mg 250 mL/hr over 60 Minutes Intravenous Every 24 hours 06/23/20 0456 06/25/20 0852       Objective: Vitals:   06/25/20 1134 06/25/20 1942 06/26/20 0630 06/26/20 1156  BP: (!) 146/67 (!) 126/93 (!) 151/67 (!) 160/85  Pulse: 63 69 (!) 53 61  Resp: 18 18 20  (!) 23  Temp: 98.7 F (37.1 C) 98.7 F (37.1 C) 98.5 F (36.9 C) 98.7 F (37.1 C)  TempSrc: Axillary Oral  Oral  SpO2: 100% 100% 100% (!) 88%  Weight:      Height:        Intake/Output Summary (Last 24 hours) at 06/26/2020 1221 Last data filed at 06/25/2020 1700 Gross per 24 hour  Intake 560 ml  Output --  Net 560 ml   Filed Weights   06/24/20 0032 06/25/20 0418  Weight: 125.7 kg 126.3 kg    Examination: General exam: Appears comfortable  HEENT: PERRLA, oral mucosa moist, no sclera icterus or thrush Respiratory system: Clear to auscultation. Respiratory effort normal. Cardiovascular system: S1 & S2 heard,  No murmurs  Gastrointestinal system: Abdomen soft, non-tender, nondistended. Normal bowel sounds   Central nervous system: Alert and oriented. No focal neurological deficits. Extremities: No cyanosis, clubbing or edema Skin: No rashes or ulcers Psychiatry:  Mood & affect appropriate.     Data Reviewed: I have personally reviewed following labs and imaging studies  CBC: Recent Labs  Lab 06/22/20 2029 06/22/20 2035 06/22/20 2111 06/23/20 0921 06/24/20 0255 06/25/20 0300 06/26/20 0319  WBC 12.9*  --   --  11.5* 12.9* 11.2* 10.2  NEUTROABS 8.8*  --   --   --   --   --   --   HGB 9.8*   < > 10.9* 10.2* 10.0* 9.8* 9.7*  HCT 29.0*   < > 32.0* 30.7* 28.7* 27.6* 27.3*  MCV 76.9*  --   --  78.5* 76.3* 76.7* 75.8*  PLT 184  --   --  193 174 192 167   < > = values in this interval not displayed.   Basic Metabolic  Panel: Recent Labs  Lab 06/22/20 1913 06/22/20 2035 06/22/20 2111 06/23/20 0921 06/24/20 0255 06/25/20 0300 06/26/20 0319  NA 142 147* 147* 144 146* 149* 144  K 4.0 4.0 3.7 4.0 3.6 3.2* 4.2  CL 112* 114*  --  110 111 111 110  CO2 20*  --   --  24 25 27 23   GLUCOSE 262* 220*  --  172* 173* 131* 163*  BUN 15 16  --  15 16 15 21   CREATININE 1.64* 1.40*  --  1.69* 1.68* 1.89* 2.34*  CALCIUM 9.8  --   --  9.5 9.6 9.3 9.0  MG  --   --   --   --   --  1.5* 2.7*   GFR: Estimated Creatinine Clearance: 25 mL/min (A) (by C-G formula based on SCr of 2.34 mg/dL (H)). Liver Function Tests: Recent Labs  Lab 06/22/20 1913 06/23/20 0921  AST 56* 48*  ALT 46* 43  ALKPHOS 94 89  BILITOT 1.2 0.9  PROT 7.2 6.8  ALBUMIN 3.1* 3.0*   No results for input(s): LIPASE, AMYLASE in the last 168 hours. Recent Labs  Lab 06/22/20 2231 06/23/20 0921 06/25/20 0300  AMMONIA 71* 25 68*   Coagulation Profile: Recent Labs  Lab 06/22/20 2029  INR 1.5*   Cardiac Enzymes: No results for input(s): CKTOTAL, CKMB, CKMBINDEX, TROPONINI in the last 168 hours. BNP (last 3 results) No results for input(s): PROBNP in the last 8760 hours. HbA1C: No results for input(s): HGBA1C in the last 72 hours. CBG: Recent Labs  Lab 06/25/20 1130 06/25/20 1603 06/25/20 2334 06/26/20 0623 06/26/20 0740  GLUCAP 171* 159* 160* 139* 139*   Lipid Profile: No results for input(s): CHOL, HDL, LDLCALC, TRIG, CHOLHDL, LDLDIRECT in the last 72 hours. Thyroid Function Tests: No results for input(s): TSH, T4TOTAL, FREET4, T3FREE, THYROIDAB in the last 72 hours. Anemia Panel: Recent Labs    06/24/20 0255  VITAMINB12 2,994*   Urine analysis:    Component Value Date/Time   COLORURINE AMBER (A) 06/23/2020 0055   APPEARANCEUR HAZY (A) 06/23/2020 0055   LABSPEC >1.046 (H) 06/23/2020 0055   PHURINE 5.0 06/23/2020 0055   GLUCOSEU NEGATIVE 06/23/2020 0055   HGBUR MODERATE (A) 06/23/2020 0055   HGBUR negative  02/26/2009 1314   BILIRUBINUR NEGATIVE 06/23/2020 0055   BILIRUBINUR NEG 04/05/2014 [place] 06/23/2020 0055   PROTEINUR >=300 (A) 06/23/2020 0055   UROBILINOGEN 0.2 08/01/2014 2305   NITRITE NEGATIVE 06/23/2020 0055   LEUKOCYTESUR NEGATIVE 06/23/2020 0055   Sepsis Labs: @LABRCNTIP (procalcitonin:4,lacticidven:4) ) Recent Results (from the past 240 hour(s))  SARS Coronavirus 2 by RT PCR (hospital order, performed in [place] hospital lab) Nasopharyngeal Nasopharyngeal Swab     Status: None   Collection Time: 06/16/20  3:20 PM   Specimen: Nasopharyngeal Swab  Result Value Ref Range Status   SARS Coronavirus 2 NEGATIVE NEGATIVE Final    Comment: (NOTE) SARS-CoV-2 target nucleic acids are NOT DETECTED.  The SARS-CoV-2 RNA is generally detectable in upper and lower respiratory specimens during the acute phase of infection. The lowest concentration of SARS-CoV-2 viral copies this assay can detect is 250 copies / mL. A negative result does not preclude SARS-CoV-2 infection and should not be used as the sole basis for treatment or other patient management decisions.  A negative result may occur with improper specimen collection / handling, submission of specimen other than nasopharyngeal swab, presence of viral mutation(s) within the areas targeted by this assay, and inadequate number of viral copies (<250 copies / mL). A negative result must be combined with clinical observations, patient history, and epidemiological information.  Fact Sheet for Patients:   StrictlyIdeas.no  Fact Sheet for Healthcare Providers: BankingDealers.co.za  This test is not yet approved or  cleared by the [place] FDA and has been authorized for  detection and/or diagnosis of SARS-CoV-2 by FDA under an Emergency Use Authorization (EUA).  This EUA will remain in effect (meaning this test can be used) for the duration of the COVID-19  declaration under Section 564(b)(1) of the Act, 21 U.S.C. section 360bbb-3(b)(1), unless the authorization is terminated or revoked sooner.  Performed at [facility], [place] [address] [place], [place] [address]   Resp Panel by RT-PCR (Flu A&B, Covid) Nasopharyngeal Swab     Status: None   Collection Time: 06/22/20 10:56 PM   Specimen: Nasopharyngeal Swab; Nasopharyngeal(NP) swabs in vial transport medium  Result Value Ref Range Status   SARS Coronavirus 2 by RT PCR NEGATIVE NEGATIVE Final    Comment: (NOTE) SARS-CoV-2 target nucleic acids are NOT DETECTED.  The SARS-CoV-2 RNA is generally detectable in upper respiratory specimens during the acute phase of infection. The lowest concentration of SARS-CoV-2 viral copies this assay can detect is 138 copies/mL. A negative result does not preclude SARS-Cov-2 infection and should not be used as the sole basis for treatment or other patient management decisions. A negative result may occur with  improper specimen collection/handling, submission of specimen other than nasopharyngeal swab, presence of viral mutation(s) within the areas targeted by this assay, and inadequate number of viral copies(<138 copies/mL). A negative result must be combined with clinical observations, patient history, and epidemiological information. The expected result is Negative.  Fact Sheet for Patients:  EntrepreneurPulse.com.au  Fact Sheet for Healthcare Providers:  IncredibleEmployment.be  This test is no t yet approved or cleared by the [place] FDA and  has been authorized for detection and/or diagnosis of SARS-CoV-2 by FDA under an Emergency Use Authorization (EUA). This EUA will remain  in effect (meaning this test can be used) for the duration of the COVID-19 declaration under Section 564(b)(1) of the Act, 21 U.S.C.section 360bbb-3(b)(1), unless the authorization is terminated  or revoked sooner.        Influenza A by PCR NEGATIVE NEGATIVE Final   Influenza B by PCR NEGATIVE NEGATIVE Final    Comment: (NOTE) The Xpert Xpress SARS-CoV-2/FLU/RSV plus assay is intended as an aid in the diagnosis of influenza from Nasopharyngeal swab specimens and should not be used as a sole basis for treatment. Nasal washings and aspirates are unacceptable for Xpert Xpress SARS-CoV-2/FLU/RSV testing.  Fact Sheet for Patients: EntrepreneurPulse.com.au  Fact Sheet for Healthcare Providers: IncredibleEmployment.be  This test is not yet approved or cleared by the [place] FDA and has been authorized for detection and/or diagnosis of SARS-CoV-2 by FDA under an Emergency Use Authorization (EUA). This EUA will remain in effect (meaning this test can be used) for the duration of the COVID-19 declaration under Section 564(b)(1) of the Act, 21 U.S.C. section 360bbb-3(b)(1), unless the authorization is terminated or revoked.  Performed at [facility], [place] [address], [place], [place] [address]   Urine Culture     Status: None   Collection Time: 06/23/20 12:47 AM   Specimen: Urine, Catheterized  Result Value Ref Range Status   Specimen Description URINE, CATHETERIZED  Final   Special Requests NONE  Final   Culture   Final    NO GROWTH Performed at [facility], [address], [place], [place] [address]    Report Status 06/23/2020 FINAL  Final  Culture, blood (routine x 2)     Status: None (Preliminary result)   Collection Time: 06/23/20  9:21 AM   Specimen: BLOOD  Result Value Ref Range Status  Specimen Description BLOOD RIGHT ANTECUBITAL  Final   Special Requests   Final    BOTTLES DRAWN AEROBIC AND ANAEROBIC Blood Culture adequate volume   Culture   Final    NO GROWTH 3 DAYS Performed at [facility], [address], [place], [place] [address]    Report Status PENDING  Incomplete         Radiology Studies: No results  found.    Scheduled Meds:  allopurinol  200 mg Oral Daily   amLODipine  2.5 mg Oral Daily   apixaban  2.5 mg Oral BID   atorvastatin  20 mg Oral QPM   azithromycin  500 mg Oral Q breakfast   gabapentin  100 mg Oral QHS   insulin aspart  0-9 Units Subcutaneous TID WC   insulin glargine  14 Units Subcutaneous QHS   isosorbide mononitrate  30 mg Oral Daily   metoprolol succinate  50 mg Oral Daily   mometasone-formoterol  2 puff Inhalation BID   montelukast  10 mg Oral QHS   Continuous Infusions:  sodium chloride 250 mL (06/25/20 0501)   cefTRIAXone (ROCEPHIN)  IV 1 g (06/26/20 0500)     LOS: 3 days      Debbe Odea, MD [facility] Pager: [email or web address] 06/26/2020, 12:21 PM

## 2020-06-26 NOTE — TOC Progression Note (Addendum)
Transition of Care [facility]) - Progression Note    Patient Details  Name: Brittany Roberts MRN: [id number] Date of Birth: 07/26/1940  Transition of Care [facility]) CM/SW [place], [place] Phone Number: 06/26/2020, 1:58 PM  Clinical Narrative:     [name]. Family is wanting [facility] but okay with [name] is unable to accept. [facility] cannot take pt if on any antibiotics or on nebulizer.   CSW confirmed with MD and notified [facility] that pt will not be on intibiotics or nebulizer. [facility] can accept today or tomorrow.   Auth approved Auth# [id number] and Ref [id number] Next review date is 07/01/20  CSW notified pt daughter. Pt chooses [facility]. D/C summary will need to be sent early AM   Expected Discharge Plan: [place] Barriers to Discharge: Continued Medical Work up  Expected Discharge Plan and Services Expected Discharge Plan: [facility]                                               Social Determinants of Health (SDOH) Interventions    Readmission Risk Interventions No flowsheet data found.

## 2020-06-27 DIAGNOSIS — I509 Heart failure, unspecified: Secondary | ICD-10-CM | POA: Diagnosis not present

## 2020-06-27 DIAGNOSIS — J449 Chronic obstructive pulmonary disease, unspecified: Secondary | ICD-10-CM | POA: Diagnosis not present

## 2020-06-27 DIAGNOSIS — E1122 Type 2 diabetes mellitus with diabetic chronic kidney disease: Secondary | ICD-10-CM | POA: Diagnosis not present

## 2020-06-27 DIAGNOSIS — R627 Adult failure to thrive: Secondary | ICD-10-CM | POA: Diagnosis not present

## 2020-06-27 DIAGNOSIS — Z03818 Encounter for observation for suspected exposure to other biological agents ruled out: Secondary | ICD-10-CM | POA: Diagnosis not present

## 2020-06-27 DIAGNOSIS — J9611 Chronic respiratory failure with hypoxia: Secondary | ICD-10-CM | POA: Diagnosis not present

## 2020-06-27 DIAGNOSIS — I480 Paroxysmal atrial fibrillation: Secondary | ICD-10-CM | POA: Diagnosis not present

## 2020-06-27 DIAGNOSIS — Z7401 Bed confinement status: Secondary | ICD-10-CM | POA: Diagnosis not present

## 2020-06-27 DIAGNOSIS — E039 Hypothyroidism, unspecified: Secondary | ICD-10-CM | POA: Diagnosis not present

## 2020-06-27 DIAGNOSIS — I10 Essential (primary) hypertension: Secondary | ICD-10-CM | POA: Diagnosis not present

## 2020-06-27 DIAGNOSIS — J9621 Acute and chronic respiratory failure with hypoxia: Secondary | ICD-10-CM | POA: Diagnosis not present

## 2020-06-27 DIAGNOSIS — I129 Hypertensive chronic kidney disease with stage 1 through stage 4 chronic kidney disease, or unspecified chronic kidney disease: Secondary | ICD-10-CM | POA: Diagnosis not present

## 2020-06-27 DIAGNOSIS — R1312 Dysphagia, oropharyngeal phase: Secondary | ICD-10-CM | POA: Diagnosis not present

## 2020-06-27 DIAGNOSIS — N179 Acute kidney failure, unspecified: Secondary | ICD-10-CM | POA: Diagnosis not present

## 2020-06-27 DIAGNOSIS — G9340 Encephalopathy, unspecified: Secondary | ICD-10-CM | POA: Diagnosis not present

## 2020-06-27 DIAGNOSIS — M25869 Other specified joint disorders, unspecified knee: Secondary | ICD-10-CM | POA: Diagnosis not present

## 2020-06-27 DIAGNOSIS — E1140 Type 2 diabetes mellitus with diabetic neuropathy, unspecified: Secondary | ICD-10-CM | POA: Diagnosis not present

## 2020-06-27 DIAGNOSIS — Z743 Need for continuous supervision: Secondary | ICD-10-CM | POA: Diagnosis not present

## 2020-06-27 DIAGNOSIS — J189 Pneumonia, unspecified organism: Secondary | ICD-10-CM

## 2020-06-27 DIAGNOSIS — I5033 Acute on chronic diastolic (congestive) heart failure: Secondary | ICD-10-CM | POA: Diagnosis not present

## 2020-06-27 DIAGNOSIS — I69398 Other sequelae of cerebral infarction: Secondary | ICD-10-CM | POA: Diagnosis not present

## 2020-06-27 DIAGNOSIS — J309 Allergic rhinitis, unspecified: Secondary | ICD-10-CM | POA: Diagnosis not present

## 2020-06-27 DIAGNOSIS — I4891 Unspecified atrial fibrillation: Secondary | ICD-10-CM | POA: Diagnosis not present

## 2020-06-27 DIAGNOSIS — E875 Hyperkalemia: Secondary | ICD-10-CM | POA: Diagnosis not present

## 2020-06-27 DIAGNOSIS — N1832 Chronic kidney disease, stage 3b: Secondary | ICD-10-CM

## 2020-06-27 DIAGNOSIS — I69328 Other speech and language deficits following cerebral infarction: Secondary | ICD-10-CM | POA: Diagnosis not present

## 2020-06-27 DIAGNOSIS — J45909 Unspecified asthma, uncomplicated: Secondary | ICD-10-CM | POA: Diagnosis not present

## 2020-06-27 DIAGNOSIS — E1149 Type 2 diabetes mellitus with other diabetic neurological complication: Secondary | ICD-10-CM | POA: Diagnosis not present

## 2020-06-27 DIAGNOSIS — I69828 Other speech and language deficits following other cerebrovascular disease: Secondary | ICD-10-CM | POA: Diagnosis not present

## 2020-06-27 DIAGNOSIS — M109 Gout, unspecified: Secondary | ICD-10-CM | POA: Diagnosis not present

## 2020-06-27 DIAGNOSIS — M6281 Muscle weakness (generalized): Secondary | ICD-10-CM | POA: Diagnosis not present

## 2020-06-27 DIAGNOSIS — F0391 Unspecified dementia with behavioral disturbance: Secondary | ICD-10-CM | POA: Diagnosis not present

## 2020-06-27 DIAGNOSIS — W06XXXA Fall from bed, initial encounter: Secondary | ICD-10-CM | POA: Diagnosis not present

## 2020-06-27 DIAGNOSIS — R0902 Hypoxemia: Secondary | ICD-10-CM | POA: Diagnosis not present

## 2020-06-27 DIAGNOSIS — N183 Chronic kidney disease, stage 3 unspecified: Secondary | ICD-10-CM | POA: Diagnosis not present

## 2020-06-27 DIAGNOSIS — Z66 Do not resuscitate: Secondary | ICD-10-CM | POA: Diagnosis not present

## 2020-06-27 DIAGNOSIS — R3915 Urgency of urination: Secondary | ICD-10-CM | POA: Diagnosis not present

## 2020-06-27 DIAGNOSIS — E119 Type 2 diabetes mellitus without complications: Secondary | ICD-10-CM | POA: Diagnosis not present

## 2020-06-27 DIAGNOSIS — R079 Chest pain, unspecified: Secondary | ICD-10-CM | POA: Diagnosis not present

## 2020-06-27 DIAGNOSIS — N3000 Acute cystitis without hematuria: Secondary | ICD-10-CM | POA: Diagnosis not present

## 2020-06-27 DIAGNOSIS — I5032 Chronic diastolic (congestive) heart failure: Secondary | ICD-10-CM | POA: Diagnosis not present

## 2020-06-27 DIAGNOSIS — R918 Other nonspecific abnormal finding of lung field: Secondary | ICD-10-CM | POA: Diagnosis not present

## 2020-06-27 DIAGNOSIS — Z20822 Contact with and (suspected) exposure to covid-19: Secondary | ICD-10-CM | POA: Diagnosis not present

## 2020-06-27 DIAGNOSIS — M2550 Pain in unspecified joint: Secondary | ICD-10-CM | POA: Diagnosis not present

## 2020-06-27 DIAGNOSIS — F0390 Unspecified dementia without behavioral disturbance: Secondary | ICD-10-CM

## 2020-06-27 DIAGNOSIS — I110 Hypertensive heart disease with heart failure: Secondary | ICD-10-CM | POA: Diagnosis not present

## 2020-06-27 DIAGNOSIS — R410 Disorientation, unspecified: Secondary | ICD-10-CM | POA: Diagnosis not present

## 2020-06-27 LAB — BASIC METABOLIC PANEL
Anion gap: 13 (ref 5–15)
BUN: 21 mg/dL (ref 8–23)
CO2: 18 mmol/L — ABNORMAL LOW (ref 22–32)
Calcium: 9.4 mg/dL (ref 8.9–10.3)
Chloride: 115 mmol/L — ABNORMAL HIGH (ref 98–111)
Creatinine, Ser: 2.14 mg/dL — ABNORMAL HIGH (ref 0.44–1.00)
GFR, Estimated: 23 mL/min — ABNORMAL LOW (ref >60)
Glucose, Bld: 148 mg/dL — ABNORMAL HIGH (ref 70–99)
Potassium: 5 mmol/L (ref 3.5–5.1)
Sodium: 146 mmol/L — ABNORMAL HIGH (ref 135–145)

## 2020-06-27 LAB — SARS CORONAVIRUS 2 BY RT PCR (HOSPITAL ORDER, PERFORMED IN ~~LOC~~ HOSPITAL LAB): SARS Coronavirus 2: NEGATIVE

## 2020-06-27 LAB — GLUCOSE, CAPILLARY
Glucose-Capillary: 132 mg/dL — ABNORMAL HIGH (ref 70–99)
Glucose-Capillary: 135 mg/dL — ABNORMAL HIGH (ref 70–99)

## 2020-06-27 MED ORDER — INSULIN ASPART 100 UNIT/ML ~~LOC~~ SOLN: 0.0000 [IU] | Freq: Three times a day (TID) | SUBCUTANEOUS | 11 refills | Status: DC

## 2020-06-27 MED ORDER — CEFUROXIME AXETIL 500 MG PO TABS: 500.0000 mg | ORAL_TABLET | Freq: Two times a day (BID) | ORAL | 0 refills | Status: AC

## 2020-06-27 MED ORDER — ENSURE ENLIVE PO LIQD: 237.0000 mL | Freq: Four times a day (QID) | ORAL | 12 refills | Status: DC

## 2020-06-27 MED ORDER — AMLODIPINE BESYLATE 2.5 MG PO TABS: 2.5000 mg | ORAL_TABLET | Freq: Every day | ORAL | Status: DC

## 2020-06-27 NOTE — TOC Transition Note (Signed)
Transition of Care [facility]) - CM/SW Discharge Note   Patient Details  Name: Brittany Roberts MRN: [id number] Date of Birth: 01-02-1941  Transition of Care [facility]) CM/SW Contact:  [address], [place] Phone Number: 06/27/2020, 10:29 AM   Clinical Narrative:    Patient will DC to: [facility] Anticipated DC date: 06/27/20 Family notified: [name]  Transport by: Corey Harold   Per MD patient ready for DC to [facility] . RN to call report prior to discharge [phone]. RN, patient, patient's family, and facility notified of DC. Discharge Summary and FL2 sent to facility. DC packet on chart. Ambulance transport requested for patient.   CSW will sign off for now as social work intervention is no longer needed. Please consult [place] again if new needs arise.     Final next level of care: Skilled Nursing Facility Barriers to Discharge: Barriers Resolved   Patient Goals and CMS Choice Patient states their goals for this hospitalization and ongoing recovery are:: Get better soon      Discharge Placement              Patient chooses bed at: [facility] Patient to be transferred to facility by: [facility] Name of family member notified: [name] Patient and family notified of of transfer: 06/27/20  Discharge Plan and Services                                     Social Determinants of Health (SDOH) Interventions     Readmission Risk Interventions No flowsheet data found.

## 2020-06-27 NOTE — Progress Notes (Signed)
Report called to SNF

## 2020-06-27 NOTE — Discharge Summary (Signed)
Physician Discharge Summary  SERENNA DEROY KAJ:[id number] DOB: [birth year] DOA: 06/22/2020  PCP: [name], [name], MD  Admit date: 06/22/2020 Discharge date: 06/27/2020  Admitted From: home  Disposition:  SNF   Recommendations for Outpatient Follow-up:  1. Continue palliative care conversations at SNF with daughter [name] 2. Please check Bmet on Monday  3. Dose Lasix PRN for weight gain 4. Encourage fluids- she is eating very little solids and should drink about 3-4 containers of Ensure a day   Discharge Condition:  stable   CODE STATUS:  DNR   Diet recommendation:  Regular diet - encourage fluids and include QID Ensure or Boost  Consultations:  none  Procedures/Studies: . none   Discharge Diagnoses:  Principal Problem:   Acute encephalopathy Active Problems:   Acute exacerbation of congestive heart failure   CAP (community acquired pneumonia)   Lung nodule   A-fib (HCC)   Severe Dementia (HCC)   Hypothyroidism   DM (diabetes mellitus), type 2 with neurological complications ([place])   Morbid obesity ([place])   COPD (chronic obstructive pulmonary disease) ([place])   AKI on Chronic kidney disease, stage III (moderate) ([place])       Brief Summary: MYKIA HOLTON is a47 y.o.F with dementia, home dwelling, SLE not on DMARD, dCHF and COPD on 3L home O2, CAD, obesity, OSA not on CPAP, HTN, hypothyroidism, history stroke, DM, seizures, CKD IIIb baseline 1.5 and recent new diagnosis apical thrombus and atrial fibrillation on anticoagulation who presents with confusion.  She was admitted 2weeksago for encephalopathy thought to be from UTI in setting of dementia. She was treated with fluids and Rocephin and discharged to SNF. During thathospital stay.  Since going to SNF, the patient gradually worsened, became more somnolent & sluggish. Daughter thought this was triggered by new start Depakote. Of note, it appears she was also started on  Per daughter the patient was conversant  and following commands up until 2PM on the day of admission (per neuro consult note). In the ED she was also found to have tachypnea, hypoxia and was on 4 L o2, WBC of 13, ammonia level of 71, CXR consistent with pulm edema and CT with RUL and basilar atelectasis or infiltrate. BNP was 1725.  Hospital Course:  Principal Problem:   Acute encephalopathy with underlying dementia - Depakote has been stopped - 12/19> Neuro consulted - MRI brain 12/20> no evidence of acute infarct - EEG 12/21> moderate diffuse encephalopathy  - I have stopped the Keppra as was at it was causing excessive somnolence   - can continue 100 mg of Neurontin at bedtime   Active Problems:  Acute on chronic hypoxic respiratory failure- at 3 L o2 at baseline for COPD - Thought to be due to CHF exacerbation and PNA- no COPD exacerbation noted - she has been receiving Ceftriaxone and Azithromycin for infiltrates noted on CT scan + cough with sputum and has had 5 days worth  - She does continue to have a cough which has been productive (but is improving) and I will be continuing treatment with 5 more days of Ceftin at the SNF.  - currently on 3 L O 2 (which is her baseline) with pulse ox 100% - 12/22> d/c IV diuresis as Cr rising and she appears euvolemic  AKI on CKD 3b - baseline Cr 1.4-1.6 -  Cr has rose to 2.34 after diuresis  (despite holding lasix on 12/22)  - after IVF boluses and then continuous IVF,  Cr has improved  to 2.14- To prevent fluid overload, I have stopped IVF - will recommend PRN Lasix at facility based on weight and oral intake (as oral intake is poor) -  I am recommend Cr be rechecked at the SNF on Monday.   Poor oral intake - poor solid intake but able to drink quite well per her daughter - Cont Ensure/ Boost 3-4 x day and follow oral intake - I am concerned that she will no be able to meet her metabolic needs with just liquids and I have had a discussion with her daughter about this. Along with  the severity of her dementia, her poor oral intake suggest a very poor prognosis and aggressive treatments would likely not add to quality of life- I suggest ongoing palliative care discussions at the SNF- the patient's daughter, [name], understands that is willing to continue palliative discussions if her mother is to decline.    Hypokalemia and hypomagnesemia - due to diuretics- replaced- stable    A-fib - receiving Eliquis and Toprol    New Lung mass noted on CT - pulmonary consulted- the patient's daughter, [name], has opted to pursue a non-aggressive approach  IDDM - on Lantus and SSI TID with meals       Discharge Exam: Vitals:   06/27/20 0808 06/27/20 0900  BP: (!) 115/94   Pulse: 64 62  Resp: (!) 23 19  Temp: (!) 97.5 F (36.4 C)   SpO2: (!) 82% 94%   Vitals:   06/27/20 0340 06/27/20 0608 06/27/20 0808 06/27/20 0900  BP: (!) 161/95  (!) 115/94   Pulse: 61  64 62  Resp: 19  (!) 23 19  Temp: 98.9 F (37.2 C)  (!) 97.5 F (36.4 C)   TempSrc:   Oral   SpO2: (!) 81%  (!) 82% 94%  Weight:  126.5 kg    Height:        General: Pt is alert, awake, not in acute distress Cardiovascular: RRR, S1/S2 +, no rubs, no gallops Respiratory: CTA bilaterally, no wheezing, no rhonchi Abdominal: Soft, NT, ND, bowel sounds + Extremities: no edema, no cyanosis   Discharge Instructions   Allergies as of 06/27/2020      Reactions   Ace Inhibitors Other (See Comments)   Coincided with sig bump in creat. Retried and creat bumped again.   Penicillins Anaphylaxis, Swelling, Rash   Has patient had a PCN reaction causing immediate rash, facial/tongue/throat swelling, SOB or lightheadedness with hypotension: Yes Has patient had a PCN reaction causing severe rash involving mucus membranes or skin necrosis: Yes Has patient had a PCN reaction that required hospitalization: Yes Has patient had a PCN reaction occurring within the last 10 years: No If all of the above answers are  "NO", then may proceed with Cephalosporin use.   Buspirone Other (See Comments)   pain   Isosorb Dinitrate-hydralazine Other (See Comments)   Sleep all the time.   Pregabalin Swelling   Ropinirole Hydrochloride Swelling   Amantadines Rash   "Swelling of the tongue"      Medication List    STOP taking these medications   Accu-Chek Guide w/Device Kit   Accu-Chek Softclix Lancets lancets   AeroChamber Plus inhaler   B-D ULTRAFINE III SHORT PEN 31G X 8 MM Misc Generic drug: Insulin Pen Needle   divalproex 125 MG DR tablet Commonly known as: DEPAKOTE   ferrous sulfate 325 (65 FE) MG tablet   levETIRAcetam 500 MG tablet Commonly known as: KEPPRA   melatonin  3 MG Tabs tablet   melatonin 5 MG Tabs     TAKE these medications   Accu-Chek Guide test strip Generic drug: glucose blood Use to test blood sugars 1-2 times daily. Dx: e11.9   acetaminophen 500 MG tablet Commonly known as: TYLENOL Take 500 mg by mouth every 6 (six) hours as needed for moderate pain.   allopurinol 100 MG tablet Commonly known as: ZYLOPRIM Take 2 tablets (200 mg total) by mouth daily.   amLODipine 2.5 MG tablet Commonly known as: NORVASC Take 1 tablet (2.5 mg total) by mouth daily. Start taking on: June 28, 2020   apixaban 2.5 MG Tabs tablet Commonly known as: ELIQUIS Take 1 tablet (2.5 mg total) by mouth 2 (two) times daily.   atorvastatin 20 MG tablet Commonly known as: LIPITOR TAKE 1 TABLET(20 MG) BY MOUTH DAILY What changed: See the new instructions.   cefUROXime 500 MG tablet Commonly known as: CEFTIN Take 1 tablet (500 mg total) by mouth 2 (two) times daily for 5 days.   DULoxetine 60 MG capsule Commonly known as: Cymbalta Take 1 capsule (60 mg total) by mouth daily.   feeding supplement Liqd Take 237 mLs by mouth 4 (four) times daily.   Fish Oil 1000 MG Caps Take 1,000 mg by mouth daily.   Fluocinolone Acetonide Scalp 0.01 % Oil Apply 1 application topically at  bedtime as needed (itching scalp).   Fluticasone-Salmeterol 250-50 MCG/DOSE Aepb Commonly known as: ADVAIR Inhale 1 puff into the lungs 2 (two) times daily.   gabapentin 100 MG capsule Commonly known as: NEURONTIN Take 1 capsule (100 mg total) by mouth at bedtime. What changed: additional instructions   insulin aspart 100 UNIT/ML injection Commonly known as: novoLOG Inject 0-9 Units into the skin 3 (three) times daily with meals. CBG 70 - 120: 0 units  CBG 121 - 150: 1 unit  CBG 151 - 200: 2 units  CBG 201 - 250: 3 units  CBG 251 - 300: 5 units  CBG 301 - 350: 7 units  CBG 351 - 400 9 units   insulin glargine 100 UNIT/ML injection Commonly known as: LANTUS Inject 0.14 mLs (14 Units total) into the skin daily. What changed: when to take this   isosorbide mononitrate 30 MG 24 hr tablet Commonly known as: IMDUR TAKE 1 TABLET(30 MG) BY MOUTH DAILY What changed: See the new instructions.   memantine 10 MG tablet Commonly known as: NAMENDA Take 10 mg by mouth daily.   metoprolol succinate 50 MG 24 hr tablet Commonly known as: TOPROL-XL TAKE 1 TABLET BY MOUTH EVERY DAY. TAKE WITH OR IMMEDIATELY FOLLOWING A MEAL What changed: See the new instructions.   montelukast 10 MG tablet Commonly known as: SINGULAIR TAKE 1 TABLET(10 MG) BY MOUTH AT BEDTIME What changed: See the new instructions.   pantoprazole 40 MG tablet Commonly known as: PROTONIX TAKE 1 TABLET(40 MG) BY MOUTH DAILY What changed: See the new instructions.   ProAir HFA 108 (90 Base) MCG/ACT inhaler Generic drug: albuterol INHALE 2 PUFF BY MOUTH EVERY 4 HOURS AS NEEDED USE ONLY IF YOU ARE WHEEZING What changed:   how much to take  how to take this  when to take this  reasons to take this  additional instructions   Symbicort 80-4.5 MCG/ACT inhaler Generic drug: budesonide-formoterol INHALE 2 PUFFS INTO THE LUNGS TWICE DAILY   Systane 0.4-0.3 % Gel ophthalmic gel Generic drug: Polyethyl  Glycol-Propyl Glycol Place 1 application into both eyes every 4 (four) hours  as needed (dry eyes).   Vitamin B Complex Tabs Take 1 tablet by mouth daily.   Vitamin C 500 MG Caps Take 1 capsule by mouth daily. What changed:   how much to take  how to take this  when to take this  additional instructions   Vitamin D3 125 MCG (5000 UT) Tabs Take 5,000 Units by mouth daily.       Contact information for after-discharge care    Destination    [facility] Preferred SNF .   Service: Skilled Nursing Contact information: [place] [phone]                 Allergies  Allergen Reactions  . Ace Inhibitors Other (See Comments)    Coincided with sig bump in creat. Retried and creat bumped again.   Marland Kitchen Penicillins Anaphylaxis, Swelling and Rash    Has patient had a PCN reaction causing immediate rash, facial/tongue/throat swelling, SOB or lightheadedness with hypotension: Yes Has patient had a PCN reaction causing severe rash involving mucus membranes or skin necrosis: Yes Has patient had a PCN reaction that required hospitalization: Yes Has patient had a PCN reaction occurring within the last 10 years: No If all of the above answers are "NO", then may proceed with Cephalosporin use.   Marland Kitchen Buspirone Other (See Comments)    pain   . Isosorb Dinitrate-Hydralazine Other (See Comments)    Sleep all the time.   . Pregabalin Swelling  . Ropinirole Hydrochloride Swelling  . Amantadines Rash    "Swelling of the tongue"      EEG  Result Date: 06/23/2020 Lora Havens, MD     06/23/2020  1:43 PM Patient Name: AYME SHORT MRN: [id number] Epilepsy Attending: Lora Havens Referring Physician/Provider: Dr Derrick Ravel Date: 06/23/2020 Duration: 27.45 mins Patient history: 79 year old female with a history of atrial fibrillation on Eliquis, seizure x 1, recent UTI with AMS, re-presenting with altered mental  status and left sided weakness. EEG to evaluate for seizure. Level of alertness: Awake AEDs during EEG study: LEv Technical aspects: This EEG study was done with scalp electrodes positioned according to the 10-20 International system of electrode placement. Electrical activity was acquired at a sampling rate of _0  and reviewed with a high frequency filter of _1  and a low frequency filter of _2 . EEG data were recorded continuously and digitally stored. Description: No posterior dominant rhythm was seen. EEG showed continuous generalized 3 to 6 Hz theta-delta slowing.  Hyperventilation and photic stimulation were not performed.   ABNORMALITY -Continuous slow, generalized IMPRESSION: This study is suggestive of moderate diffuse encephalopathy, nonspecific etiology. No seizures or epileptiform discharges were seen throughout the recording. Lora Havens    CT Head Wo Contrast  Result Date: 06/07/2020 CLINICAL DATA:  Found down EXAM: CT HEAD WITHOUT CONTRAST TECHNIQUE: Contiguous axial images were obtained from the base of the skull through the vertex without intravenous contrast. COMPARISON:  06/05/2020 FINDINGS: Brain: Stable encephalomalacia right frontal lobe from prior infarct. No signs of acute infarct or hemorrhage. Lateral ventricles and midline structures are unremarkable. No acute extra-axial fluid collections. No mass effect. Vascular: Stable atherosclerosis.  No hyperdense vessel. Skull: Normal. Negative for fracture or focal lesion. Sinuses/Orbits: Postsurgical and chronic changes of the right maxillary sinus are stable. Remaining sinuses are clear. Other: None. IMPRESSION: 1. Stable head CT, no acute process. Electronically Signed   By: Randa Ngo M.D.   On:  06/07/2020 18:13   CT HEAD WO CONTRAST  Result Date: 06/05/2020 CLINICAL DATA:  Altered mental status. EXAM: CT HEAD WITHOUT CONTRAST TECHNIQUE: Contiguous axial images were obtained from the base of the skull through the vertex  without intravenous contrast. COMPARISON:  CT head dated 11/01/2019. FINDINGS: Brain: No evidence of acute infarction, hemorrhage, hydrocephalus, extra-axial collection or mass lesion/mass effect. Unchanged encephalomalacia of the right frontal lobe. There is mild cerebral volume loss with associated ex vacuo dilatation. Periventricular white matter hypoattenuation likely represents chronic small vessel ischemic disease. Vascular: There are vascular calcifications in the carotid siphons. Skull: Normal. Negative for fracture or focal lesion. Sinuses/Orbits: Postsurgical changes and chronic reactive changes are noted in the maxillary sinus. Other: None. IMPRESSION: No acute intracranial process. Electronically Signed   By: Zerita Boers M.D.   On: 06/05/2020 11:55   CT Angio Chest PE W and/or Wo Contrast  Result Date: 06/23/2020 CLINICAL DATA:  Altered mental status. EXAM: CT ANGIOGRAPHY CHEST WITH CONTRAST TECHNIQUE: Multidetector CT imaging of the chest was performed using the standard protocol during bolus administration of intravenous contrast. Multiplanar CT image reconstructions and MIPs were obtained to evaluate the vascular anatomy. CONTRAST:  37m OMNIPAQUE IOHEXOL 350 MG/ML SOLN COMPARISON:  January 13, 2017 FINDINGS: Cardiovascular: There is marked severity calcification of the aortic arch. Satisfactory opacification of the pulmonary arteries to the segmental level. No evidence of pulmonary embolism. There is mild cardiomegaly with marked severity coronary artery calcification. No pericardial effusion. Mediastinum/Nodes: Mild pretracheal and subcarinal lymphadenopathy is seen. Marked severity narrowing of the origin of the right main bronchus is noted (axial CT image 31, CT series number 6). An ill-defined right hilar soft tissue mass is seen which measures approximately 2.4 cm x 1.8 cm. Adjacent right suprahilar peribronchial thickening is also present (approximately 7.5 mm in thickness). Stable marked  severity enlargement of the left lobe of the thyroid gland is seen. The esophagus demonstrates no significant findings. Lungs/Pleura: Mild atelectasis and/or infiltrate is seen within the right apex, posterior aspect of the right upper lobe and bilateral lung bases. Small bilateral pleural effusions are noted. No pneumothorax is identified. Upper Abdomen: Surgical clips are seen within the gallbladder fossa. Musculoskeletal: No chest wall abnormality. No acute or significant osseous findings. Review of the MIP images confirms the above findings. IMPRESSION: 1. No evidence of pulmonary embolism. 2. Ill-defined right hilar soft tissue mass with adjacent right suprahilar peribronchial thickening, concerning for the presence of a primary lung malignancy. 3. Mild right upper lobe and bibasilar atelectasis and/or infiltrate. 4. Small bilateral pleural effusions. 5. Stable marked severity enlargement of the left lobe of the thyroid gland. 6. Aortic atherosclerosis. Aortic Atherosclerosis (ICD10-I70.0). Electronically Signed   By: TVirgina NorfolkM.D.   On: 06/23/2020 01:10   CT Cervical Spine Wo Contrast  Result Date: 06/07/2020 CLINICAL DATA:  Found down EXAM: CT CERVICAL SPINE WITHOUT CONTRAST TECHNIQUE: Multidetector CT imaging of the cervical spine was performed without intravenous contrast. Multiplanar CT image reconstructions were also generated. COMPARISON:  None. FINDINGS: Alignment: Alignment is grossly anatomic. Skull base and vertebrae: Evaluation of the lower cervical spine is limited due to body habitus and patient motion. No gross bony abnormalities. No evidence of displaced fracture. Soft tissues and spinal canal: Heterogeneous enlargement of the thyroid is again noted, previously evaluated by ultrasound and biopsy. No prevertebral soft tissue swelling. No visible canal hematoma. Disc levels: There is mild diffuse facet hypertrophy. Disc spaces appear grossly well preserved. No significant compressive  sequelae on this  exam limited by motion and body habitus. Upper chest: Airway is patent, with deviation of the trachea to the right from the enlarged left lobe thyroid. Emphysematous changes are seen at the lung apices. Other: Reconstructed images demonstrate no additional findings. IMPRESSION: 1. Study limited by patient motion and body habitus. 2. No evidence of cervical spine fracture. Electronically Signed   By: Randa Ngo M.D.   On: 06/07/2020 18:15   MR BRAIN WO CONTRAST  Result Date: 06/23/2020 CLINICAL DATA:  Delirium. Additional history provided: Altered mental status. EXAM: MRI HEAD WITHOUT CONTRAST TECHNIQUE: Multiplanar, multiecho pulse sequences of the brain and surrounding structures were obtained without intravenous contrast. COMPARISON:  Noncontrast head CT 06/22/2020.  Brain MRI 06/12/2020. FINDINGS: Brain: Due to altered mental status, the patient was unable to tolerate the full examination. Only axial and coronal diffusion-weighted sequences, a sagittal T1 weighted sequence, an axial T2 weighted sequence and an axial T2/FLAIR sequence could be obtained. The acquired sequences are motion degraded. Most notably, the sagittal T1 weighted sequence is moderate to severely motion degraded. Mild cerebral and cerebellar atrophy. No evidence of acute infarction. Redemonstrated chronic cortically based infarct within the mid to anterior right frontal lobe. Background mild multifocal T2/FLAIR hyperintensity within the cerebral white matter which is nonspecific, but compatible with chronic small vessel ischemic disease. No evidence of intracranial mass. No extra-axial fluid collection. No midline shift. Partially empty sella turcica. Vascular: Expected proximal arterial flow voids. Skull and upper cervical spine: Within described limitations, no focal marrow lesion is identified. Sinuses/Orbits: Visualized orbits show no acute finding. Mild mucosal thickening within the right maxillary sinus with  associated prominent chronic reactive osteitis. IMPRESSION: Motion degraded and prematurely terminated examination as described. No evidence of acute infarction. Redemonstrated chronic cortically based right frontal lobe MCA territory infarct. Background mild generalized parenchymal atrophy and chronic small vessel ischemic disease. Chronic right maxillary sinusitis. Electronically Signed   By: Kellie Simmering DO   On: 06/23/2020 07:56   MR BRAIN WO CONTRAST  Result Date: 06/12/2020 CLINICAL DATA:  Delirium. EXAM: MRI HEAD WITHOUT CONTRAST TECHNIQUE: Multiplanar, multiecho pulse sequences of the brain and surrounding structures were obtained without intravenous contrast. COMPARISON:  Head CT June 07, 2020. FINDINGS: Incomplete and limited study due to patient inability to lie still in the scanner for the duration of the study. Only diffusion-weighted sequences were obtained which are degraded by motion. No large acute territorial infarct identified. Chronic infarct in the right frontal lobe. IMPRESSION: 1. Incomplete and limited study due to patient inability to lie still in the scanner for the duration of the study. 2. No large acute territorial infarct identified in this very limited study. 3. Chronic infarct in the right frontal lobe. Electronically Signed   By: Pedro Earls M.D.   On: 06/12/2020 14:13   CT ABDOMEN PELVIS W CONTRAST  Result Date: 06/23/2020 CLINICAL DATA:  Altered mental status. EXAM: CT ABDOMEN AND PELVIS WITH CONTRAST TECHNIQUE: Multidetector CT imaging of the abdomen and pelvis was performed using the standard protocol following bolus administration of intravenous contrast. CONTRAST:  65m OMNIPAQUE IOHEXOL 350 MG/ML SOLN COMPARISON:  December 05, 2019 FINDINGS: The study is limited secondary to patient motion. Lower chest: Mild atelectasis and/or infiltrate is seen within the bilateral lung bases. Small bilateral pleural effusions are noted. Hepatobiliary: No focal liver  abnormality is seen. Status post cholecystectomy. No biliary dilatation. Pancreas: Unremarkable. No pancreatic ductal dilatation or surrounding inflammatory changes. Spleen: Chronic curvilinear calcification is seen along the periphery  of the spleen. Adrenals/Urinary Tract: Adrenal glands are unremarkable. Kidneys are normal in size, without renal calculi or hydronephrosis. Multiple stable bilateral simple renal cysts are seen. Bladder is unremarkable. Stomach/Bowel: Stomach is within normal limits. The appendix is not clearly identified. No evidence of bowel wall thickening, distention, or inflammatory changes. Noninflamed diverticula are seen within the sigmoid colon. Vascular/Lymphatic: There is marked severity aortic calcification and atherosclerosis. No enlarged abdominal or pelvic lymph nodes. Reproductive: Stable calcified uterine fibroids are present. The bilateral adnexa are unremarkable. Other: No abdominal wall hernia or abnormality. No abdominopelvic ascites. Musculoskeletal: Degenerative changes seen throughout the lumbar spine. IMPRESSION: 1. Mild atelectasis and/or infiltrate within the bilateral lung bases with small bilateral pleural effusions. 2. Sigmoid diverticulosis. 3. Stable bilateral simple renal cysts. 4. Calcified uterine fibroids. 5. Aortic atherosclerosis. Aortic Atherosclerosis (ICD10-I70.0). Electronically Signed   By: Virgina Norfolk M.D.   On: 06/23/2020 01:15   US RENAL  Result Date: 06/07/2020 CLINICAL DATA:  Acute kidney injury EXAM: RENAL / URINARY TRACT ULTRASOUND COMPLETE COMPARISON:  December 05, 2019 FINDINGS: Right Kidney: Renal measurements: 8.7 x 4.3 x 5 cm = volume: 99 mL. There is no hydronephrosis. There is cortical thinning and increased cortical echogenicity. Large renal cysts are noted measuring up to approximately 7 cm. Left Kidney: Renal measurements: 10 x 5 x 5 cm = volume: 130 mL. There is increased cortical echogenicity without evidence for hydronephrosis. There  is cortical thinning. A simple appearing 1.5 cm cyst is noted. Bladder: The urinary bladder is significantly distended. Other: None. IMPRESSION: 1. No hydronephrosis. 2. Findings suggestive of medical renal disease. 3. Bilateral renal cysts are noted measuring up to approximately 7 cm on the right. 4. Significantly distended urinary bladder. Electronically Signed   By: Constance Holster M.D.   On: 06/07/2020 21:31   DG Chest Portable 1 View  Result Date: 06/07/2020 CLINICAL DATA:  Altered mental status. EXAM: PORTABLE CHEST 1 VIEW COMPARISON:  06/05/2020 FINDINGS: 1424 hours. Low lung volumes. The cardio pericardial silhouette is enlarged. There is pulmonary vascular congestion without overt pulmonary edema. Diffuse interstitial opacities suggest edema. No substantial pleural effusion. Bones are diffusely demineralized. IMPRESSION: Low volume film with cardiomegaly, vascular congestion and interstitial pulmonary edema. Electronically Signed   By: Misty Stanley M.D.   On: 06/07/2020 14:34   DG Chest Port 1 View  Result Date: 06/05/2020 CLINICAL DATA:  Altered mental status EXAM: PORTABLE CHEST 1 VIEW COMPARISON:  Chest radiograph December 05, 2019 and chest CT January 13, 2017 FINDINGS: Stable cardiomegaly. Atherosclerotic calcifications of the aorta. Pulmonary vascular congestion with prominent bilateral interstitial markings. Small bilateral pleural effusions. No pneumothorax. Osseous structures are unchanged. IMPRESSION: Cardiomegaly with mild pulmonary edema and small bilateral pleural effusions. Electronically Signed   By: Dahlia Bailiff MD   On: 06/05/2020 11:21   DG ABD ACUTE 2+V W 1V CHEST  Result Date: 06/22/2020 CLINICAL DATA:  Altered level of consciousness EXAM: DG ABDOMEN ACUTE WITH 1 VIEW CHEST COMPARISON:  06/07/2020 FINDINGS: Supine and upright frontal views of the abdomen as well as an upright frontal view of the chest are obtained. The cardiac silhouette is enlarged but stable. Increased  central vascular congestion and diffuse interstitial prominence, with developing consolidation at the lung bases. Small left effusion. No pneumothorax. Bowel gas pattern is unremarkable without obstruction or ileus. No masses or abnormal calcifications. No free gas in the greater peritoneal sac. IMPRESSION: 1. Worsening volume status, with developing pulmonary edema. 2. Unremarkable bowel gas pattern. Electronically Signed  By: Randa Ngo M.D.   On: 06/22/2020 23:21   ECHOCARDIOGRAM COMPLETE  Addendum Date: 06/08/2020   Compared to prior study in June 2021, possible apical thrombus and apical akinesis not noted in the present study. Now there is no wall motion abnormality. [email or web address] Electronically Amended 06/08/2020, 6:25 PM   Final Loreli Dollar)    Result Date: 06/08/2020    ECHOCARDIOGRAM REPORT   Patient Name:   MAYVIS AGUDELO Date of Exam: 06/08/2020 Medical Rec #:  [id number]   Height:       63.5 in Accession #:    [id number]  Weight:       274.1 lb Date of Birth:  10/24/1940   BSA:          2.224 m Patient Age:    58 years    BP:           114/60 mmHg Patient Gender: F           HR:           57 bpm. Exam Location:  Inpatient Procedure: 2D Echo Indications:     acute diastolic chf 932.35  History:         Patient has prior history of Echocardiogram examinations, most                  recent 12/06/2019. Cardiomegaly, CAD, Lupus. Chronic kidney                  disease. history of stroke., Signs/Symptoms:Altered Mental                  Status; Risk Factors:Diabetes, Dyslipidemia and Sleep Apnea.  Sonographer:     Johny Chess Referring Phys:  [id number] [place] Diagnosing Phys: Adrian Prows MD IMPRESSIONS  1. Indeterminate diastolic function due to MAC. Left ventricular ejection fraction, by estimation, is 55 to 60%. The left ventricle has normal function. The left ventricle has no regional wall motion abnormalities. There is moderate left ventricular hypertrophy. Left ventricular diastolic parameters are  indeterminate.  2. Right ventricular systolic function is normal. The right ventricular size is normal.  3. Left atrial size was severely dilated.  4. Grossly normal in size.  5. The mitral valve is degenerative. No evidence of mitral valve regurgitation. No evidence of mitral stenosis. Moderate to severe mitral annular calcification.  6. The aortic valve is calcified. Aortic valve regurgitation is not visualized. Mild aortic valve sclerosis is present, with no evidence of aortic valve stenosis.  7. The inferior vena cava is normal in size with greater than 50% respiratory variability, suggesting right atrial pressure of 3 mmHg. Comparison(s): No significant change from prior study. No significant change from 12/06/2019. FINDINGS  Left Ventricle: Indeterminate diastolic function due to MAC. Left ventricular ejection fraction, by estimation, is 55 to 60%. The left ventricle has normal function. The left ventricle has no regional wall motion abnormalities. The left ventricular internal cavity size was normal in size. There is moderate left ventricular hypertrophy. Left ventricular diastolic parameters are indeterminate. Right Ventricle: The right ventricular size is normal. No increase in right ventricular wall thickness. Right ventricular systolic function is normal. Left Atrium: Left atrial size was severely dilated. Right Atrium: Grossly normal in size. Right atrial size was not well visualized. Pericardium: There is no evidence of pericardial effusion. Mitral Valve: The mitral valve is degenerative in appearance. Moderate to severe mitral annular calcification. No evidence of mitral valve regurgitation. No evidence of mitral valve stenosis. Tricuspid  Valve: The tricuspid valve is grossly normal. Tricuspid valve regurgitation is not demonstrated. No evidence of tricuspid stenosis. Aortic Valve: The aortic valve is calcified. Aortic valve regurgitation is not visualized. Mild aortic valve sclerosis is present, with  no evidence of aortic valve stenosis. Pulmonic Valve: The pulmonic valve was not well visualized. Pulmonic valve regurgitation is mild. Aorta: The aortic root is normal in size and structure. Venous: The inferior vena cava is normal in size with greater than 50% respiratory variability, suggesting right atrial pressure of 3 mmHg. IAS/Shunts: The interatrial septum was not well visualized.  LEFT VENTRICLE PLAX 2D LVIDd:         4.90 cm Diastology LVIDs:         3.90 cm LV e' medial:  4.79 cm/s LV PW:         1.40 cm LV e' lateral: 7.29 cm/s LV IVS:        1.30 cm  IVC IVC diam: 1.50 cm LEFT ATRIUM             Index LA diam:        3.80 cm 1.71 cm/m LA Vol (A2C):   62.7 ml 28.19 ml/m LA Vol (A4C):   92.4 ml 41.55 ml/m LA Biplane Vol: 77.6 ml 34.89 ml/m   AORTA Ao Asc diam: 3.20 cm Adrian Prows MD Electronically signed by Adrian Prows MD Signature Date/Time: 06/08/2020/6:15:09 PM    Final (Amended)    US THYROID  Result Date: 06/23/2020 CLINICAL DATA:  Incidental on CT. 79 year old female presenting with altered mental status and enlarged left thyroid noted on chest CT. History of left thyroid biopsy on 07/31/2014. EXAM: THYROID ULTRASOUND TECHNIQUE: Ultrasound examination of the thyroid gland and adjacent soft tissues was performed. COMPARISON:  07/16/2014, 07/31/2014, chest CT from 06/23/2020 FINDINGS: Parenchymal Echotexture: Markedly heterogenous Isthmus: 0.6 cm, previously 0.6 cm Right lobe: 4.7 x 2.0 x 1.8 cm, previously 5.4 x 2.1 x 1.6 cm Left lobe: 7.5 x 4.3 x 6.9 cm, previously 9.0 x 4.7 x 5.4 cm _________________________________________________________ Estimated total number of nodules >/= 1 cm: 0 Number of spongiform nodules >/=  2 cm not described below (TR1): 0 Number of mixed cystic and solid nodules >/= 1.5 cm not described below ([place]): 0 _________________________________________________________ No discrete nodules are seen within the thyroid gland. Per sonographer note, this was a limited exam due to  patient's mental status. IMPRESSION: Similar appearing diffusely heterogeneous thyroid gland. Asymmetrically enlarged left lobe without discrete nodule. Recommend correlation with prior biopsy results. Per sonographer note, this was a limited exam due to patient's mental status. The above is in keeping with the ACR TI-RADS recommendations - J Am Coll Radiol 2017;14:587-595. Ruthann Cancer, MD [facility] Electronically Signed   By: Ruthann Cancer MD   On: 06/23/2020 08:00   CT HEAD CODE STROKE WO CONTRAST  Result Date: 06/22/2020 CLINICAL DATA:  Code stroke. Initial evaluation for neuro deficit, stroke suspected. EXAM: CT HEAD WITHOUT CONTRAST TECHNIQUE: Contiguous axial images were obtained from the base of the skull through the vertex without intravenous contrast. COMPARISON:  Prior MRI from 06/12/2020 FINDINGS: Brain: Examination mildly degraded by motion artifact. Age-related cerebral atrophy with chronic microvascular ischemic disease. Chronic right MCA distribution infarct involving the anterior right frontal lobe. No acute intracranial hemorrhage. No acute large vessel territory infarct. No mass lesion, midline shift or mass effect. No hydrocephalus or extra-axial fluid collection. Vascular: No hyperdense vessel. Scattered vascular calcifications noted within the carotid siphons. Skull:  Scalp soft tissues and calvarium within normal limits. Sinuses/Orbits: Right gaze noted. Prior bilateral ocular lens replacement. Chronic right maxillary sinusitis. Mastoid air cells are clear. Other: None. ASPECTS [facility] Stroke Program Early CT Score) - Ganglionic level infarction (caudate, lentiform nuclei, internal capsule, insula, M1-M3 cortex): 7 - Supraganglionic infarction (M4-M6 cortex): 3 Total score (0-10 with 10 being normal): 10 IMPRESSION: 1. No acute intracranial infarct or other abnormality. 2. ASPECTS is 10. 3. Chronic right MCA distribution  infarct. 4. Underlying atrophy with mild chronic small vessel ischemic disease. 5. Chronic right maxillary sinusitis. These results were communicated to Dr. Cheral Marker at 8:27 [place] 12/19/2021by text page via the [facility] messaging system. Electronically Signed   By: Jeannine Boga M.D.   On: 06/22/2020 20:28   US Abdomen Limited RUQ (LIVER/GB)  Result Date: 06/23/2020 CLINICAL DATA:  Elevated LFTs.  Cholecystectomy. EXAM: ULTRASOUND ABDOMEN LIMITED RIGHT UPPER QUADRANT COMPARISON:  CT 06/23/2020. FINDINGS: Gallbladder: Cholecystectomy. Common bile duct: Diameter: 9.2 mm. Mild common bile duct dilatation can be seen following cholecystectomy. Liver: Mild increased echogenicity suggesting fatty infiltration cannot be excluded. No focal hepatic abnormality identified. Portal vein is patent on color Doppler imaging with normal direction of blood flow towards the liver. Other: Multiple renal cysts with thin septation again noted with the largest measuring 9.6 cm. Mild amount of debris may be within the cyst. The cysts are most likely benign. Similar findings noted on prior CT. IMPRESSION: 1. Cholecystectomy. Common bile duct measures 9.2 mm. Mild prominence of the common bile duct can be seen following cholecystectomy. Further evaluation of common bile duct dilatation can be performed with MRCP as needed. No intrahepatic biliary ductal dilatation. 2. Mild increased hepatic echogenicity suggesting fatty infiltration. No focal hepatic abnormality identified. Electronically Signed   By: Marcello Moores  Register   On: 06/23/2020 07:24     The results of significant diagnostics from this hospitalization (including imaging, microbiology, ancillary and laboratory) are listed below for reference.     Microbiology: Recent Results (from the past 240 hour(s))  Resp Panel by RT-PCR (Flu A&B, Covid) Nasopharyngeal Swab     Status: None   Collection Time: 06/22/20 10:56 PM   Specimen: Nasopharyngeal Swab; Nasopharyngeal(NP)  swabs in vial transport medium  Result Value Ref Range Status   SARS Coronavirus 2 by RT PCR NEGATIVE NEGATIVE Final    Comment: (NOTE) SARS-CoV-2 target nucleic acids are NOT DETECTED.  The SARS-CoV-2 RNA is generally detectable in upper respiratory specimens during the acute phase of infection. The lowest concentration of SARS-CoV-2 viral copies this assay can detect is 138 copies/mL. A negative result does not preclude SARS-Cov-2 infection and should not be used as the sole basis for treatment or other patient management decisions. A negative result may occur with  improper specimen collection/handling, submission of specimen other than nasopharyngeal swab, presence of viral mutation(s) within the areas targeted by this assay, and inadequate number of viral copies(<138 copies/mL). A negative result must be combined with clinical observations, patient history, and epidemiological information. The expected result is Negative.  Fact Sheet for Patients:  EntrepreneurPulse.com.au  Fact Sheet for Healthcare Providers:  IncredibleEmployment.be  This test is no t yet approved or cleared by the [place] FDA and  has been authorized for detection and/or diagnosis of SARS-CoV-2 by FDA under an Emergency Use Authorization (EUA). This EUA will remain  in effect (meaning this test can be used) for the duration of the COVID-19 declaration under Section 564(b)(1) of the Act, 21 U.S.C.section 360bbb-3(b)(1), unless the authorization  is terminated  or revoked sooner.       Influenza A by PCR NEGATIVE NEGATIVE Final   Influenza B by PCR NEGATIVE NEGATIVE Final    Comment: (NOTE) The Xpert Xpress SARS-CoV-2/FLU/RSV plus assay is intended as an aid in the diagnosis of influenza from Nasopharyngeal swab specimens and should not be used as a sole basis for treatment. Nasal washings and aspirates are unacceptable for Xpert Xpress  SARS-CoV-2/FLU/RSV testing.  Fact Sheet for Patients: EntrepreneurPulse.com.au  Fact Sheet for Healthcare Providers: IncredibleEmployment.be  This test is not yet approved or cleared by the [place] FDA and has been authorized for detection and/or diagnosis of SARS-CoV-2 by FDA under an Emergency Use Authorization (EUA). This EUA will remain in effect (meaning this test can be used) for the duration of the COVID-19 declaration under Section 564(b)(1) of the Act, 21 U.S.C. section 360bbb-3(b)(1), unless the authorization is terminated or revoked.  Performed at [facility], [place] [address], [place], [place] [address]   Urine Culture     Status: None   Collection Time: 06/23/20 12:47 AM   Specimen: Urine, Catheterized  Result Value Ref Range Status   Specimen Description URINE, CATHETERIZED  Final   Special Requests NONE  Final   Culture   Final    NO GROWTH Performed at [facility], [address] [place], [place] [address]    Report Status 06/23/2020 FINAL  Final  Culture, blood (routine x 2)     Status: None (Preliminary result)   Collection Time: 06/23/20  9:21 AM   Specimen: BLOOD  Result Value Ref Range Status   Specimen Description BLOOD RIGHT ANTECUBITAL  Final   Special Requests   Final    BOTTLES DRAWN AEROBIC AND ANAEROBIC Blood Culture adequate volume   Culture   Final    NO GROWTH 3 DAYS Performed at [facility], [place] [address] [place], [place] [address]    Report Status PENDING  Incomplete     Labs: BNP (last 3 results) Recent Labs    12/05/19 1139 06/07/20 2220 06/23/20 0018  BNP 505.5* 160.9* 7,096.2*   Basic Metabolic Panel: Recent Labs  Lab 06/23/20 0921 06/24/20 0255 06/25/20 0300 06/26/20 0319 06/27/20 0753  NA 144 146* 149* 144 146*  K 4.0 3.6 3.2* 4.2 5.0  CL 110 111 111 110 115*  CO2 _0 18*  GLUCOSE 172* 173* 131* 163* 148*  BUN _1 CREATININE  1.69* 1.68* 1.89* 2.34* 2.14*  CALCIUM 9.5 9.6 9.3 9.0 9.4  MG  --   --  1.5* 2.7*  --    Liver Function Tests: Recent Labs  Lab 06/22/20 1913 06/23/20 0921  AST 56* 48*  ALT 46* 43  ALKPHOS 94 89  BILITOT 1.2 0.9  PROT 7.2 6.8  ALBUMIN 3.1* 3.0*   No results for input(s): LIPASE, AMYLASE in the last 168 hours. Recent Labs  Lab 06/22/20 2231 06/23/20 0921 06/25/20 0300  AMMONIA 71* 25 68*   CBC: Recent Labs  Lab 06/22/20 2029 06/22/20 2035 06/22/20 2111 06/23/20 0921 06/24/20 0255 06/25/20 0300 06/26/20 0319  WBC 12.9*  --   --  11.5* 12.9* 11.2* 10.2  NEUTROABS 8.8*  --   --   --   --   --   --   HGB 9.8*   < > 10.9* 10.2* 10.0* 9.8* 9.7*  HCT 29.0*   < > 32.0* 30.7* 28.7* 27.6* 27.3*  MCV 76.9*  --   --  78.5* 76.3* 76.7* 75.8*  PLT 184  --   --  193 174 192 167   < > = values in this interval not displayed.   Cardiac Enzymes: No results for input(s): CKTOTAL, CKMB, CKMBINDEX, TROPONINI in the last 168 hours. BNP: Invalid input(s): POCBNP CBG: Recent Labs  Lab 06/26/20 0740 06/26/20 1214 06/26/20 1619 06/26/20 2148 06/27/20 0728  GLUCAP 139* 163* 118* 193* 132*   D-Dimer No results for input(s): DDIMER in the last 72 hours. Hgb A1c No results for input(s): HGBA1C in the last 72 hours. Lipid Profile No results for input(s): CHOL, HDL, LDLCALC, TRIG, CHOLHDL, LDLDIRECT in the last 72 hours. Thyroid function studies No results for input(s): TSH, T4TOTAL, T3FREE, THYROIDAB in the last 72 hours.  Invalid input(s): FREET3 Anemia work up No results for input(s): VITAMINB12, FOLATE, FERRITIN, TIBC, IRON, RETICCTPCT in the last 72 hours. Urinalysis    Component Value Date/Time   COLORURINE AMBER (A) 06/23/2020 0055   APPEARANCEUR HAZY (A) 06/23/2020 0055   LABSPEC >1.046 (H) 06/23/2020 0055   PHURINE 5.0 06/23/2020 0055   GLUCOSEU NEGATIVE 06/23/2020 0055   HGBUR MODERATE (A) 06/23/2020 0055   HGBUR negative 02/26/2009 1314   BILIRUBINUR NEGATIVE  06/23/2020 0055   BILIRUBINUR NEG 04/05/2014 1455   KETONESUR NEGATIVE 06/23/2020 0055   PROTEINUR >=300 (A) 06/23/2020 0055   UROBILINOGEN 0.2 08/01/2014 2305   NITRITE NEGATIVE 06/23/2020 0055   LEUKOCYTESUR NEGATIVE 06/23/2020 0055   Sepsis Labs Invalid input(s): PROCALCITONIN,  WBC,  LACTICIDVEN Microbiology Recent Results (from the past 240 hour(s))  Resp Panel by RT-PCR (Flu A&B, Covid) Nasopharyngeal Swab     Status: None   Collection Time: 06/22/20 10:56 PM   Specimen: Nasopharyngeal Swab; Nasopharyngeal(NP) swabs in vial transport medium  Result Value Ref Range Status   SARS Coronavirus 2 by RT PCR NEGATIVE NEGATIVE Final    Comment: (NOTE) SARS-CoV-2 target nucleic acids are NOT DETECTED.  The SARS-CoV-2 RNA is generally detectable in upper respiratory specimens during the acute phase of infection. The lowest concentration of SARS-CoV-2 viral copies this assay can detect is 138 copies/mL. A negative result does not preclude SARS-Cov-2 infection and should not be used as the sole basis for treatment or other patient management decisions. A negative result may occur with  improper specimen collection/handling, submission of specimen other than nasopharyngeal swab, presence of viral mutation(s) within the areas targeted by this assay, and inadequate number of viral copies(<138 copies/mL). A negative result must be combined with clinical observations, patient history, and epidemiological information. The expected result is Negative.  Fact Sheet for Patients:  EntrepreneurPulse.com.au  Fact Sheet for Healthcare Providers:  IncredibleEmployment.be  This test is no t yet approved or cleared by the [place] FDA and  has been authorized for detection and/or diagnosis of SARS-CoV-2 by FDA under an Emergency Use Authorization (EUA). This EUA will remain  in effect (meaning this test can be used) for the duration of the COVID-19  declaration under Section 564(b)(1) of the Act, 21 U.S.C.section 360bbb-3(b)(1), unless the authorization is terminated  or revoked sooner.       Influenza A by PCR NEGATIVE NEGATIVE Final   Influenza B by PCR NEGATIVE NEGATIVE Final    Comment: (NOTE) The Xpert Xpress SARS-CoV-2/FLU/RSV plus assay is intended as an aid in the diagnosis of influenza from Nasopharyngeal swab specimens and should not be used as a sole basis for treatment. Nasal washings and aspirates are unacceptable for  Xpert Xpress SARS-CoV-2/FLU/RSV testing.  Fact Sheet for Patients: EntrepreneurPulse.com.au  Fact Sheet for Healthcare Providers: IncredibleEmployment.be  This test is not yet approved or cleared by the [place] FDA and has been authorized for detection and/or diagnosis of SARS-CoV-2 by FDA under an Emergency Use Authorization (EUA). This EUA will remain in effect (meaning this test can be used) for the duration of the COVID-19 declaration under Section 564(b)(1) of the Act, 21 U.S.C. section 360bbb-3(b)(1), unless the authorization is terminated or revoked.  Performed at [facility], [place] [address] [place], [place] [address]   Urine Culture     Status: None   Collection Time: 06/23/20 12:47 AM   Specimen: Urine, Catheterized  Result Value Ref Range Status   Specimen Description URINE, CATHETERIZED  Final   Special Requests NONE  Final   Culture   Final    NO GROWTH Performed at [facility], [address], [place], [place] [address]    Report Status 06/23/2020 FINAL  Final  Culture, blood (routine x 2)     Status: None (Preliminary result)   Collection Time: 06/23/20  9:21 AM   Specimen: BLOOD  Result Value Ref Range Status   Specimen Description BLOOD RIGHT ANTECUBITAL  Final   Special Requests   Final    BOTTLES DRAWN AEROBIC AND ANAEROBIC Blood Culture adequate volume   Culture   Final    NO GROWTH 3 DAYS Performed at [facility], [place] [address] [place], [place] [address]    Report Status PENDING  Incomplete     Time coordinating discharge in minutes: 65  SIGNED:   Debbe Odea, MD  [facility] 06/27/2020, 11:10 AM

## 2020-06-27 NOTE — Care Management Important Message (Signed)
Important Message  Patient Details  Name: Brittany Roberts MRN: [id number] Date of Birth: October 11, 1940   Medicare Important Message Given:  Yes     Shelda Altes 06/27/2020, 8:37 AM

## 2020-06-28 LAB — CULTURE, BLOOD (ROUTINE X 2)
Culture: NO GROWTH
Special Requests: ADEQUATE

## 2020-06-30 ENCOUNTER — Non-Acute Institutional Stay (SKILLED_NURSING_FACILITY): Payer: Medicare Other | Admitting: Orthopedic Surgery

## 2020-06-30 ENCOUNTER — Telehealth: Payer: Self-pay | Admitting: Family Medicine

## 2020-06-30 ENCOUNTER — Encounter: Payer: Self-pay | Admitting: Orthopedic Surgery

## 2020-06-30 DIAGNOSIS — J449 Chronic obstructive pulmonary disease, unspecified: Secondary | ICD-10-CM

## 2020-06-30 DIAGNOSIS — Z794 Long term (current) use of insulin: Secondary | ICD-10-CM

## 2020-06-30 DIAGNOSIS — E039 Hypothyroidism, unspecified: Secondary | ICD-10-CM

## 2020-06-30 DIAGNOSIS — E119 Type 2 diabetes mellitus without complications: Secondary | ICD-10-CM

## 2020-06-30 DIAGNOSIS — N183 Chronic kidney disease, stage 3 unspecified: Secondary | ICD-10-CM

## 2020-06-30 DIAGNOSIS — I110 Hypertensive heart disease with heart failure: Secondary | ICD-10-CM

## 2020-06-30 DIAGNOSIS — R410 Disorientation, unspecified: Secondary | ICD-10-CM

## 2020-06-30 DIAGNOSIS — R918 Other nonspecific abnormal finding of lung field: Secondary | ICD-10-CM

## 2020-06-30 DIAGNOSIS — I480 Paroxysmal atrial fibrillation: Secondary | ICD-10-CM

## 2020-06-30 LAB — BASIC METABOLIC PANEL
BUN: 15 (ref 4–21)
CO2: 112 — AB (ref 13–22)
Chloride: 112 — AB (ref 99–108)
Creatinine: 1.5 — AB (ref 0.5–1.1)
Glucose: 168
Potassium: 4 (ref 3.4–5.3)
Sodium: 146 (ref 137–147)

## 2020-06-30 LAB — COMPREHENSIVE METABOLIC PANEL: Calcium: 9.5 (ref 8.7–10.7)

## 2020-06-30 NOTE — Progress Notes (Signed)
Location:  [facility] Provider: Windell Moulding, AGNP-C    [place] Mariea Clonts, D.O., C.M.D.  Goals of Care:  Advanced Directives 06/27/2020  Does Patient Have a Medical Advance Directive? Unable to assess, patient is non-responsive or altered mental status  Type of Advance Directive -  Does patient want to make changes to medical advance directive? -  Copy of [place] in Chart? -  Would patient like information on creating a medical advance directive? No - Guardian declined  Pre-existing out of facility DNR order (yellow form or pink MOST form) -     No chief complaint on file.   HPI: Patient is a 79 y.o. female seen today for hospital follow-up s/p admission for altered mental status.   Daughter, Herbert Spires present for encounter.   She has been a resident of [facility] since 12/24, seen at bedside today. PMH includes: hypertension, systolic heart failure, cardiomyopathy, atrial fibrillation, COPD, OSA, asthma, GERD, hypothyroidism, type 2 diabetes mellitus insulin dependent, tardive dyskinesia, acute delirium, arthritis, chronic kidney disease stage III, morbid obesity, and memory loss.   She has been hospitalized twice within the past month. Prior to hospitalizations she lived independently at home. First occurrence 12/4- 12/14 for acute encephalopathy secondary to UTI and underlying dementia. Treated with 3- day course of ceftriaxone. MRI brian negative for acute findings. New onset afib discovered, started on Eliquis for anticoagulation. She was discharged to [facility] for additional PT/OT and skilled nursing. While at [facility], daughter noticed she became somnolent after staff gave her medication for agitation. A few days later, daughter noticed she was having difficulty breathing, was lethargic and not responding to her. She was readmitted to [facility] 12/19-12/24 and code stoke was activated. Head CT revealed chronic  right MCA distribution infarct and underlying atrophy with mild chronic small vessel ischemic disease. Neurology consulted and recommended metabolic work up for acute delirium, MRI brain and EEG. Lab work revealed elevated ammonia level of 71. BNP elevated at 1752. Chest x-ray showed pulmonary edema. CT chest negative for PE, but ill-defined right hilar soft tissue mass with adjacent right suprahilar peribronchial thickening concerning for primary lung malignancy. Left lobe of thyroid gland enlarged. During hospitalization she was given IV cardizem, Lasix, IM ziprasidone, and IV keppra. She was discharged to [facility] 12/24 for PT/OT and skilled nursing services.   Today, she sitting on the side of the bed eating lunch. She is alert to self and person, disoriented to place, time and situation. Follows commands, can express some needs. She is quiet and only answers a few questions. She can tell me she has two children. Answers "yes" and "no" questions better. She is on 3 liters oxygen.   Last night facility nurse reports she has found on the floor next to the bed. No injuries reported. Vitals stable. Neuro checks initiated. Facility nurse states she slides in bed, thinks she slowly slid off bed. Floor mats next to bed during encounter.   Recorded blood pressures are as follows:  12/27- 130/67  12/26- 140/72  12/25- 140/80  Recorded blood glucose are as follows:  12/27- 118, 152  12/26- 212, 226, 295, [place] does not report any concerns, denies recent seizure or hypoglycemia, vitals stable.    Past Medical History:  Diagnosis Date  . Acute delirium 01/14/2017  . Anxiety   . Arthritis    "all over"  . Asthma   . Bipolar disorder ([place])   .  Complication of anesthesia    "w/right foot OR they gave me too much and they couldn't get me woke"  . Congestive heart failure, unspecified   . Coronary artery calcification seen on CAT scan 01/14/2017  . Coronary artery disease    "I've got  1 stent" (06/30/2015)  . Depression   . Discoid lupus   . Fibromyalgia    "I've been told I have this; can't take Lyrica cause I'm allergic to it" (06/30/2015)  . GERD (gastroesophageal reflux disease)   . History of gout   . History of hiatal hernia    "real bad" (06/30/2015)  . Hypertension   . Hypothyroidism   . Memory loss   . On home oxygen therapy    "2L q hs" (06/30/2015)  . OSA (obstructive sleep apnea)    "just use my oxygen; no mask" (06/30/2015)  . Other and unspecified hyperlipidemia   . Penetrating foot wound    left nonhealing foot wound on the dorsal surface  . Peripheral neuropathy   . Pneumonia "many of times"  . Refusal of blood transfusions as patient is Jehovah's Witness   . Renal failure, unspecified    "kidneys not working 100%" (06/30/2015)  . Seizure ([place])   . Sickle cell trait ([place])   . Systemic lupus ([place])   . Thoracic aortic atherosclerosis ([place]) 01/14/2017  . Type II diabetes mellitus ([place])   . Unspecified vitamin D deficiency     Past Surgical History:  Procedure Laterality Date  . ANKLE ARTHROSCOPY WITH OPEN REDUCTION INTERNAL FIXATION (ORIF) Right 03/2011  . CARDIAC CATHETERIZATION N/A 07/01/2015   Procedure: Left Heart Cath and Coronary Angiography;  Surgeon: Adrian Prows, MD;  Location: [facility];  Service: Cardiovascular;  Laterality: N/A;  . CARDIAC CATHETERIZATION N/A 07/01/2015   Procedure: Intravascular Pressure Wire/FFR Study;  Surgeon: Adrian Prows, MD;  Location: [facility];  Service: Cardiovascular;  Laterality: N/A;  . CARPAL TUNNEL RELEASE Bilateral   . CATARACT EXTRACTION W/ INTRAOCULAR LENS  IMPLANT, BILATERAL Bilateral   . CHOLECYSTECTOMY OPEN    . COLONOSCOPY N/A 12/10/2013   Procedure: COLONOSCOPY;  Surgeon: Inda Castle, MD;  Location: [facility];  Service: Endoscopy;  Laterality: N/A;  . FRACTURE SURGERY    . INCISION AND DRAINAGE ABSCESS Left    foot "under my little toe"  . KNEE LIGAMENT RECONSTRUCTION  Right   . LEFT HEART CATHETERIZATION WITH CORONARY ANGIOGRAM N/A 08/08/2012   Procedure: LEFT HEART CATHETERIZATION WITH CORONARY ANGIOGRAM;  Surgeon: [name], MD;  Location: [facility] CATH LAB;  Service: Cardiovascular;  Laterality: N/A;  . NASAL SINUS SURGERY    . PERCUTANEOUS CORONARY STENT INTERVENTION (PCI-S) N/A 08/21/2012   Procedure: PERCUTANEOUS CORONARY STENT INTERVENTION (PCI-S);  Surgeon: [name], MD;  Location: [facility] CATH LAB;  Service: Cardiovascular;  Laterality: N/A;  . TUBAL LIGATION      Allergies  Allergen Reactions  . Ace Inhibitors Other (See Comments)    Coincided with sig bump in creat. Retried and creat bumped again.   Marland Kitchen Penicillins Anaphylaxis, Swelling and Rash    Has patient had a PCN reaction causing immediate rash, facial/tongue/throat swelling, SOB or lightheadedness with hypotension: Yes Has patient had a PCN reaction causing severe rash involving mucus membranes or skin necrosis: Yes Has patient had a PCN reaction that required hospitalization: Yes Has patient had a PCN reaction occurring within the last 10 years: No If all of the above answers are "NO", then may proceed with  Cephalosporin use.   Marland Kitchen Buspirone Other (See Comments)    pain   . Isosorb Dinitrate-Hydralazine Other (See Comments)    Sleep all the time.   . Pregabalin Swelling  . Ropinirole Hydrochloride Swelling  . Amantadines Rash    "Swelling of the tongue"    Outpatient Encounter Medications as of 06/30/2020  Medication Sig  . acetaminophen (TYLENOL) 500 MG tablet Take 500 mg by mouth every 6 (six) hours as needed for moderate pain.  Marland Kitchen allopurinol (ZYLOPRIM) 100 MG tablet Take 2 tablets (200 mg total) by mouth daily.  Marland Kitchen amLODipine (NORVASC) 2.5 MG tablet Take 1 tablet (2.5 mg total) by mouth daily.  Marland Kitchen apixaban (ELIQUIS) 2.5 MG TABS tablet Take 1 tablet (2.5 mg total) by mouth 2 (two) times daily.  . Ascorbic Acid (VITAMIN C) 500 MG CAPS Take 1 capsule by mouth daily. (Patient  taking differently: Take 500 mg by mouth daily.)  . atorvastatin (LIPITOR) 20 MG tablet TAKE 1 TABLET(20 MG) BY MOUTH DAILY (Patient taking differently: Take 20 mg by mouth every evening.)  . B Complex Vitamins (VITAMIN B COMPLEX) TABS Take 1 tablet by mouth daily.  . cefUROXime (CEFTIN) 500 MG tablet Take 1 tablet (500 mg total) by mouth 2 (two) times daily for 5 days.  . Cholecalciferol (VITAMIN D3) 125 MCG (5000 UT) TABS Take 5,000 Units by mouth daily.  . DULoxetine (CYMBALTA) 60 MG capsule Take 1 capsule (60 mg total) by mouth daily.  . feeding supplement (ENSURE ENLIVE / ENSURE PLUS) LIQD Take 237 mLs by mouth 4 (four) times daily.  . Fluocinolone Acetonide Scalp 0.01 % OIL Apply 1 application topically at bedtime as needed (itching scalp).   . Fluticasone-Salmeterol (ADVAIR) 250-50 MCG/DOSE AEPB Inhale 1 puff into the lungs 2 (two) times daily.  Marland Kitchen gabapentin (NEURONTIN) 100 MG capsule Take 1 capsule (100 mg total) by mouth at bedtime. (Patient taking differently: Take 100 mg by mouth at bedtime. For muscle spasms)  . glucose blood (ACCU-CHEK GUIDE) test strip Use to test blood sugars 1-2 times daily. Dx: e11.9  . insulin aspart (NOVOLOG) 100 UNIT/ML injection Inject 0-9 Units into the skin 3 (three) times daily with meals. CBG 70 - 120: 0 units  CBG 121 - 150: 1 unit  CBG 151 - 200: 2 units  CBG 201 - 250: 3 units  CBG 251 - 300: 5 units  CBG 301 - 350: 7 units  CBG 351 - 400 9 units  . insulin glargine (LANTUS) 100 UNIT/ML injection Inject 0.14 mLs (14 Units total) into the skin daily. (Patient taking differently: Inject 14 Units into the skin at bedtime.)  . isosorbide mononitrate (IMDUR) 30 MG 24 hr tablet TAKE 1 TABLET(30 MG) BY MOUTH DAILY (Patient taking differently: Take 30 mg by mouth daily.)  . memantine (NAMENDA) 10 MG tablet Take 10 mg by mouth daily.  . metoprolol succinate (TOPROL-XL) 50 MG 24 hr tablet TAKE 1 TABLET BY MOUTH EVERY DAY. TAKE WITH OR IMMEDIATELY FOLLOWING A  MEAL (Patient taking differently: Take 50 mg by mouth daily.)  . montelukast (SINGULAIR) 10 MG tablet TAKE 1 TABLET(10 MG) BY MOUTH AT BEDTIME (Patient taking differently: Take 10 mg by mouth at bedtime.)  . Omega-3 Fatty Acids (FISH OIL) 1000 MG CAPS Take 1,000 mg by mouth daily.   . pantoprazole (PROTONIX) 40 MG tablet TAKE 1 TABLET(40 MG) BY MOUTH DAILY (Patient taking differently: Take 40 mg by mouth daily.)  . Polyethyl Glycol-Propyl Glycol (SYSTANE) 0.4-0.3 % GEL ophthalmic  gel Place 1 application into both eyes every 4 (four) hours as needed (dry eyes).  Marland Kitchen PROAIR HFA 108 (90 Base) MCG/ACT inhaler INHALE 2 PUFF BY MOUTH EVERY 4 HOURS AS NEEDED USE ONLY IF YOU ARE WHEEZING (Patient taking differently: Inhale 2 puffs into the lungs every 4 (four) hours as needed for wheezing or shortness of breath.)  . SYMBICORT 80-4.5 MCG/ACT inhaler INHALE 2 PUFFS INTO THE LUNGS TWICE DAILY (Patient not taking: Reported on 06/23/2020)   No facility-administered encounter medications on file as of 06/30/2020.    Review of Systems:  Review of Systems  Unable to perform ROS: Dementia    Health Maintenance  Topic Date Due  . COVID-19 Vaccine (2 - Pfizer 3-dose booster series) 10/25/2019  . INFLUENZA VACCINE  02/03/2020  . OPHTHALMOLOGY EXAM  08/13/2020  . FOOT EXAM  10/16/2020  . HEMOGLOBIN A1C  12/06/2020  . DEXA SCAN  Completed  . PNA vac Low Risk Adult  Completed  . TETANUS/TDAP  Discontinued    Physical Exam: There were no vitals filed for this visit. There is no height or weight on file to calculate BMI. Physical Exam Vitals reviewed.  Constitutional:      General: She is not in acute distress.    Appearance: She is obese.  HENT:     Head: Normocephalic.     Right Ear: There is no impacted cerumen.     Nose: Nose normal.     Mouth/Throat:     Mouth: Mucous membranes are moist.     Pharynx: No posterior oropharyngeal erythema.  Eyes:     Extraocular Movements: Extraocular movements  intact.  Cardiovascular:     Rate and Rhythm: Normal rate. Rhythm irregular.     Pulses: Normal pulses.     Heart sounds: Normal heart sounds. No murmur heard.   Pulmonary:     Effort: Pulmonary effort is normal. No respiratory distress.     Breath sounds: Normal breath sounds. No wheezing.  Abdominal:     General: Bowel sounds are normal. There is no distension.     Palpations: Abdomen is soft.     Tenderness: There is no abdominal tenderness.  Musculoskeletal:     Cervical back: Normal range of motion.     Right lower leg: No edema.     Left lower leg: No edema.  Lymphadenopathy:     Cervical: No cervical adenopathy.  Skin:    General: Skin is warm and dry.     Capillary Refill: Capillary refill takes less than 2 seconds.  Neurological:     Mental Status: She is alert. Mental status is at baseline. She is disoriented.     Motor: Weakness present.     Gait: Gait abnormal.  Psychiatric:        Attention and Perception: Attention normal.        Mood and Affect: Affect is flat.        Speech: Speech is delayed.        Behavior: Behavior normal.        Cognition and Memory: Memory is impaired.     Labs reviewed: Basic Metabolic Panel: Recent Labs    11/02/19 0626 11/02/19 0627 11/04/19 0500 11/05/19 0531 12/07/19 0415 12/11/19 1612 06/07/20 1458 06/08/20 0445 06/13/20 0239 06/14/20 0358 06/22/20 2231 06/23/20 0921 06/25/20 0300 06/26/20 0319 06/27/20 0753  NA  --    < > 138   < > 138   < > 139   < > 144   < >  --    < >  149* 144 146*  K  --    < > 5.4*   < > 3.8   < > 3.9   < > 4.0   < >  --    < > 3.2* 4.2 5.0  CL  --    < > 112*   < > 103   < > 101   < > 110   < >  --    < > 111 110 115*  CO2  --    < > 20*   < > 27   < > 25   < > 24   < >  --    < > 27 23 18*  GLUCOSE  --    < > 224*   < > 141*   < > 252*   < > 167*   < >  --    < > 131* 163* 148*  BUN  --    < > 65*   < > 29*   < > 63*   < > 29*   < >  --    < > 15 21 21   CREATININE  --    < > 1.94*   < >  1.68*   < > 3.17*   < > 1.59*   < >  --    < > 1.89* 2.34* 2.14*  CALCIUM  --    < > 8.9   < > 8.2*   < > 12.0*   < > 10.7*   < >  --    < > 9.3 9.0 9.4  MG  --    < > 2.3   < > 1.2*   < >  --    < > 1.6*  --   --   --  1.5* 2.7*  --   PHOS  --   --  3.3  --  3.7  --   --   --   --   --   --   --   --   --   --   TSH 0.370  --   --   --   --   --  2.934  --   --   --  0.840  --   --   --   --    < > = values in this interval not displayed.   Liver Function Tests: Recent Labs    06/07/20 1458 06/22/20 1913 06/23/20 0921  AST 38 56* 48*  ALT 21 46* 43  ALKPHOS 97 94 89  BILITOT 0.8 1.2 0.9  PROT 7.5 7.2 6.8  ALBUMIN 3.4* 3.1* 3.0*   Recent Labs    12/05/19 1139  LIPASE 30   Recent Labs    06/22/20 2231 06/23/20 0921 06/25/20 0300  AMMONIA 71* 25 68*   CBC: Recent Labs    06/09/20 0228 06/10/20 0249 06/11/20 0428 06/22/20 2029 06/22/20 2035 06/24/20 0255 06/25/20 0300 06/26/20 0319  WBC 9.7 10.2   < > 12.9*   < > 12.9* 11.2* 10.2  NEUTROABS 6.0 6.0  --  8.8*  --   --   --   --   HGB 10.4* 9.5*   < > 9.8*   < > 10.0* 9.8* 9.7*  HCT 30.8* 28.3*   < > 29.0*   < > 28.7* 27.6* 27.3*  MCV 75.7* 75.7*   < > 76.9*   < > 76.3* 76.7* 75.8*  PLT 171  159   < > 184   < > 174 192 167   < > = values in this interval not displayed.   Lipid Panel: No results for input(s): CHOL, HDL, LDLCALC, TRIG, CHOLHDL, LDLDIRECT in the last 8760 hours. Lab Results  Component Value Date   HGBA1C 8.5 (H) 06/07/2020    Procedures since last visit: EEG  Result Date: 06/23/2020 Lora Havens, MD     06/23/2020  1:43 PM Patient Name: SHALIN LINDERS MRN: [id number] Epilepsy Attending: Lora Havens Referring Physician/Provider: Dr Derrick Ravel Date: 06/23/2020 Duration: 27.45 mins Patient history: 79 year old female with a history of atrial fibrillation on Eliquis, seizure x 1, recent UTI with AMS, re-presenting with altered mental status and left sided weakness. EEG to evaluate for  seizure. Level of alertness: Awake AEDs during EEG study: LEv Technical aspects: This EEG study was done with scalp electrodes positioned according to the 10-20 International system of electrode placement. Electrical activity was acquired at a sampling rate of 500Hz  and reviewed with a high frequency filter of 70Hz  and a low frequency filter of 1Hz . EEG data were recorded continuously and digitally stored. Description: No posterior dominant rhythm was seen. EEG showed continuous generalized 3 to 6 Hz theta-delta slowing.  Hyperventilation and photic stimulation were not performed.   ABNORMALITY -Continuous slow, generalized IMPRESSION: This study is suggestive of moderate diffuse encephalopathy, nonspecific etiology. No seizures or epileptiform discharges were seen throughout the recording. Lora Havens    CT Angio Chest PE W and/or Wo Contrast  Result Date: 06/23/2020 CLINICAL DATA:  Altered mental status. EXAM: CT ANGIOGRAPHY CHEST WITH CONTRAST TECHNIQUE: Multidetector CT imaging of the chest was performed using the standard protocol during bolus administration of intravenous contrast. Multiplanar CT image reconstructions and MIPs were obtained to evaluate the vascular anatomy. CONTRAST:  8mL OMNIPAQUE IOHEXOL 350 MG/ML SOLN COMPARISON:  January 13, 2017 FINDINGS: Cardiovascular: There is marked severity calcification of the aortic arch. Satisfactory opacification of the pulmonary arteries to the segmental level. No evidence of pulmonary embolism. There is mild cardiomegaly with marked severity coronary artery calcification. No pericardial effusion. Mediastinum/Nodes: Mild pretracheal and subcarinal lymphadenopathy is seen. Marked severity narrowing of the origin of the right main bronchus is noted (axial CT image 31, CT series number 6). An ill-defined right hilar soft tissue mass is seen which measures approximately 2.4 cm x 1.8 cm. Adjacent right suprahilar peribronchial thickening is also present  (approximately 7.5 mm in thickness). Stable marked severity enlargement of the left lobe of the thyroid gland is seen. The esophagus demonstrates no significant findings. Lungs/Pleura: Mild atelectasis and/or infiltrate is seen within the right apex, posterior aspect of the right upper lobe and bilateral lung bases. Small bilateral pleural effusions are noted. No pneumothorax is identified. Upper Abdomen: Surgical clips are seen within the gallbladder fossa. Musculoskeletal: No chest wall abnormality. No acute or significant osseous findings. Review of the MIP images confirms the above findings. IMPRESSION: 1. No evidence of pulmonary embolism. 2. Ill-defined right hilar soft tissue mass with adjacent right suprahilar peribronchial thickening, concerning for the presence of a primary lung malignancy. 3. Mild right upper lobe and bibasilar atelectasis and/or infiltrate. 4. Small bilateral pleural effusions. 5. Stable marked severity enlargement of the left lobe of the thyroid gland. 6. Aortic atherosclerosis. Aortic Atherosclerosis (ICD10-I70.0). Electronically Signed   By: Virgina Norfolk M.D.   On: 06/23/2020 01:10   MR BRAIN WO CONTRAST  Result Date: 06/23/2020 CLINICAL DATA:  Delirium. Additional  history provided: Altered mental status. EXAM: MRI HEAD WITHOUT CONTRAST TECHNIQUE: Multiplanar, multiecho pulse sequences of the brain and surrounding structures were obtained without intravenous contrast. COMPARISON:  Noncontrast head CT 06/22/2020.  Brain MRI 06/12/2020. FINDINGS: Brain: Due to altered mental status, the patient was unable to tolerate the full examination. Only axial and coronal diffusion-weighted sequences, a sagittal T1 weighted sequence, an axial T2 weighted sequence and an axial T2/FLAIR sequence could be obtained. The acquired sequences are motion degraded. Most notably, the sagittal T1 weighted sequence is moderate to severely motion degraded. Mild cerebral and cerebellar atrophy. No  evidence of acute infarction. Redemonstrated chronic cortically based infarct within the mid to anterior right frontal lobe. Background mild multifocal T2/FLAIR hyperintensity within the cerebral white matter which is nonspecific, but compatible with chronic small vessel ischemic disease. No evidence of intracranial mass. No extra-axial fluid collection. No midline shift. Partially empty sella turcica. Vascular: Expected proximal arterial flow voids. Skull and upper cervical spine: Within described limitations, no focal marrow lesion is identified. Sinuses/Orbits: Visualized orbits show no acute finding. Mild mucosal thickening within the right maxillary sinus with associated prominent chronic reactive osteitis. IMPRESSION: Motion degraded and prematurely terminated examination as described. No evidence of acute infarction. Redemonstrated chronic cortically based right frontal lobe MCA territory infarct. Background mild generalized parenchymal atrophy and chronic small vessel ischemic disease. Chronic right maxillary sinusitis. Electronically Signed   By: Kellie Simmering DO   On: 06/23/2020 07:56   CT ABDOMEN PELVIS W CONTRAST  Result Date: 06/23/2020 CLINICAL DATA:  Altered mental status. EXAM: CT ABDOMEN AND PELVIS WITH CONTRAST TECHNIQUE: Multidetector CT imaging of the abdomen and pelvis was performed using the standard protocol following bolus administration of intravenous contrast. CONTRAST:  52mL OMNIPAQUE IOHEXOL 350 MG/ML SOLN COMPARISON:  December 05, 2019 FINDINGS: The study is limited secondary to patient motion. Lower chest: Mild atelectasis and/or infiltrate is seen within the bilateral lung bases. Small bilateral pleural effusions are noted. Hepatobiliary: No focal liver abnormality is seen. Status post cholecystectomy. No biliary dilatation. Pancreas: Unremarkable. No pancreatic ductal dilatation or surrounding inflammatory changes. Spleen: Chronic curvilinear calcification is seen along the periphery  of the spleen. Adrenals/Urinary Tract: Adrenal glands are unremarkable. Kidneys are normal in size, without renal calculi or hydronephrosis. Multiple stable bilateral simple renal cysts are seen. Bladder is unremarkable. Stomach/Bowel: Stomach is within normal limits. The appendix is not clearly identified. No evidence of bowel wall thickening, distention, or inflammatory changes. Noninflamed diverticula are seen within the sigmoid colon. Vascular/Lymphatic: There is marked severity aortic calcification and atherosclerosis. No enlarged abdominal or pelvic lymph nodes. Reproductive: Stable calcified uterine fibroids are present. The bilateral adnexa are unremarkable. Other: No abdominal wall hernia or abnormality. No abdominopelvic ascites. Musculoskeletal: Degenerative changes seen throughout the lumbar spine. IMPRESSION: 1. Mild atelectasis and/or infiltrate within the bilateral lung bases with small bilateral pleural effusions. 2. Sigmoid diverticulosis. 3. Stable bilateral simple renal cysts. 4. Calcified uterine fibroids. 5. Aortic atherosclerosis. Aortic Atherosclerosis (ICD10-I70.0). Electronically Signed   By: Virgina Norfolk M.D.   On: 06/23/2020 01:15   DG ABD ACUTE 2+V W 1V CHEST  Result Date: 06/22/2020 CLINICAL DATA:  Altered level of consciousness EXAM: DG ABDOMEN ACUTE WITH 1 VIEW CHEST COMPARISON:  06/07/2020 FINDINGS: Supine and upright frontal views of the abdomen as well as an upright frontal view of the chest are obtained. The cardiac silhouette is enlarged but stable. Increased central vascular congestion and diffuse interstitial prominence, with developing consolidation at the lung bases. Small left  effusion. No pneumothorax. Bowel gas pattern is unremarkable without obstruction or ileus. No masses or abnormal calcifications. No free gas in the greater peritoneal sac. IMPRESSION: 1. Worsening volume status, with developing pulmonary edema. 2. Unremarkable bowel gas pattern. Electronically  Signed   By: Randa Ngo M.D.   On: 06/22/2020 23:21   US THYROID  Result Date: 06/23/2020 CLINICAL DATA:  Incidental on CT. 79 year old female presenting with altered mental status and enlarged left thyroid noted on chest CT. History of left thyroid biopsy on 07/31/2014. EXAM: THYROID ULTRASOUND TECHNIQUE: Ultrasound examination of the thyroid gland and adjacent soft tissues was performed. COMPARISON:  07/16/2014, 07/31/2014, chest CT from 06/23/2020 FINDINGS: Parenchymal Echotexture: Markedly heterogenous Isthmus: 0.6 cm, previously 0.6 cm Right lobe: 4.7 x 2.0 x 1.8 cm, previously 5.4 x 2.1 x 1.6 cm Left lobe: 7.5 x 4.3 x 6.9 cm, previously 9.0 x 4.7 x 5.4 cm _________________________________________________________ Estimated total number of nodules >/= 1 cm: 0 Number of spongiform nodules >/=  2 cm not described below (TR1): 0 Number of mixed cystic and solid nodules >/= 1.5 cm not described below ([place]): 0 _________________________________________________________ No discrete nodules are seen within the thyroid gland. Per sonographer note, this was a limited exam due to patient's mental status. IMPRESSION: Similar appearing diffusely heterogeneous thyroid gland. Asymmetrically enlarged left lobe without discrete nodule. Recommend correlation with prior biopsy results. Per sonographer note, this was a limited exam due to patient's mental status. The above is in keeping with the ACR TI-RADS recommendations - J Am Coll Radiol 2017;14:587-595. Ruthann Cancer, MD [facility] Radiology Electronically Signed   By: Ruthann Cancer MD   On: 06/23/2020 08:00   CT HEAD CODE STROKE WO CONTRAST  Result Date: 06/22/2020 CLINICAL DATA:  Code stroke. Initial evaluation for neuro deficit, stroke suspected. EXAM: CT HEAD WITHOUT CONTRAST TECHNIQUE: Contiguous axial images were obtained from the base of the skull through the vertex without intravenous contrast. COMPARISON:   Prior MRI from 06/12/2020 FINDINGS: Brain: Examination mildly degraded by motion artifact. Age-related cerebral atrophy with chronic microvascular ischemic disease. Chronic right MCA distribution infarct involving the anterior right frontal lobe. No acute intracranial hemorrhage. No acute large vessel territory infarct. No mass lesion, midline shift or mass effect. No hydrocephalus or extra-axial fluid collection. Vascular: No hyperdense vessel. Scattered vascular calcifications noted within the carotid siphons. Skull: Scalp soft tissues and calvarium within normal limits. Sinuses/Orbits: Right gaze noted. Prior bilateral ocular lens replacement. Chronic right maxillary sinusitis. Mastoid air cells are clear. Other: None. ASPECTS [facility] Stroke Program Early CT Score) - Ganglionic level infarction (caudate, lentiform nuclei, internal capsule, insula, M1-M3 cortex): 7 - Supraganglionic infarction (M4-M6 cortex): 3 Total score (0-10 with 10 being normal): 10 IMPRESSION: 1. No acute intracranial infarct or other abnormality. 2. ASPECTS is 10. 3. Chronic right MCA distribution infarct. 4. Underlying atrophy with mild chronic small vessel ischemic disease. 5. Chronic right maxillary sinusitis. These results were communicated to Dr. Cheral Marker at 8:27 Long 12/19/2021by text page via the [facility] messaging system. Electronically Signed   By: Jeannine Boga M.D.   On: 06/22/2020 20:28   US Abdomen Limited RUQ (LIVER/GB)  Result Date: 06/23/2020 CLINICAL DATA:  Elevated LFTs.  Cholecystectomy. EXAM: ULTRASOUND ABDOMEN LIMITED RIGHT UPPER QUADRANT COMPARISON:  CT 06/23/2020. FINDINGS: Gallbladder: Cholecystectomy. Common bile duct: Diameter: 9.2 mm. Mild common bile duct dilatation can be seen following cholecystectomy. Liver: Mild increased echogenicity suggesting fatty infiltration cannot be excluded. No focal hepatic abnormality identified. Portal vein  is patent on color Doppler imaging with normal direction of blood  flow towards the liver. Other: Multiple renal cysts with thin septation again noted with the largest measuring 9.6 cm. Mild amount of debris may be within the cyst. The cysts are most likely benign. Similar findings noted on prior CT. IMPRESSION: 1. Cholecystectomy. Common bile duct measures 9.2 mm. Mild prominence of the common bile duct can be seen following cholecystectomy. Further evaluation of common bile duct dilatation can be performed with MRCP as needed. No intrahepatic biliary ductal dilatation. 2. Mild increased hepatic echogenicity suggesting fatty infiltration. No focal hepatic abnormality identified. Electronically Signed   By: Marcello Moores  Register   On: 06/23/2020 07:24    Assessment/Plan 1. Insulin dependent type 2 diabetes mellitus (HCC) - blood glucose averaging 200-300's, no reported hypoglycemia - continue sliding scale novolog with meals - continue lantus 14 units sub Q QHS - continue carb modified diet  2. Chronic obstructive pulmonary disease, unspecified COPD type ([place]) - stable with 3 liters oxygen - continue advair and prn albuterol  3. Stage 3 chronic kidney disease, unspecified whether stage 3a or 3b CKD (HCC) - stable, continue to avoid nephrotoxic drugs and dose adjust medications to be renally excreted  4. Morbid obesity ([place]) - ongoing, continue to limit calories to <1500cal/day  5. Acquired hypothyroidism - left lobe enlarged - last TSH 0.84  6. Paroxysmal atrial fibrillation (HCC) - rate controlled with metoprolol - continue eliquis for anticoagulation  7. Delirium - ammonia level and BNP elevated - depaokte and keppra stopped - MRI brain with no acute infarct - suspect underlying encephalopathy  8. Lung Mass - pulmonary was consulted, at this time daughter has opted non-aggressive approach  9. Hypertensive heart disease with heart failure - lasix held during hospitalization due to low bp - no ankle edema present on exam, she does not appear  sob    Labs/tests ordered:  Cbc/diff and bmp in 1 week Next appt:  Visit date not found  Windell Moulding, AGNP-C [facility] [address], [place] [address] Cell Phone (Mon-Fri 8am-5pm):  [phone] On Call:  [phone] & follow prompts after 5pm & weekends Office Phone:  [phone] Office Fax:  [phone]

## 2020-06-30 NOTE — Telephone Encounter (Signed)
Amy Halfarty from [employer] is calling to advise Brittany Roberts was admitted to [facility] on 06/22/2020 and was discharged on 06/27/2020 to a skilled nursing facility.

## 2020-06-30 NOTE — Telephone Encounter (Signed)
fyi

## 2020-07-07 DIAGNOSIS — R3915 Urgency of urination: Secondary | ICD-10-CM | POA: Diagnosis not present

## 2020-07-08 ENCOUNTER — Encounter: Payer: Self-pay | Admitting: Internal Medicine

## 2020-07-08 ENCOUNTER — Non-Acute Institutional Stay (SKILLED_NURSING_FACILITY): Payer: Medicare Other | Admitting: Internal Medicine

## 2020-07-08 DIAGNOSIS — F0391 Unspecified dementia with behavioral disturbance: Secondary | ICD-10-CM | POA: Diagnosis not present

## 2020-07-08 DIAGNOSIS — I480 Paroxysmal atrial fibrillation: Secondary | ICD-10-CM | POA: Diagnosis not present

## 2020-07-08 DIAGNOSIS — R627 Adult failure to thrive: Secondary | ICD-10-CM | POA: Diagnosis not present

## 2020-07-08 DIAGNOSIS — R918 Other nonspecific abnormal finding of lung field: Secondary | ICD-10-CM | POA: Diagnosis not present

## 2020-07-08 DIAGNOSIS — W06XXXA Fall from bed, initial encounter: Secondary | ICD-10-CM

## 2020-07-08 DIAGNOSIS — Z66 Do not resuscitate: Secondary | ICD-10-CM

## 2020-07-08 DIAGNOSIS — J449 Chronic obstructive pulmonary disease, unspecified: Secondary | ICD-10-CM | POA: Diagnosis not present

## 2020-07-08 DIAGNOSIS — J9611 Chronic respiratory failure with hypoxia: Secondary | ICD-10-CM

## 2020-07-08 DIAGNOSIS — I509 Heart failure, unspecified: Secondary | ICD-10-CM | POA: Diagnosis not present

## 2020-07-08 DIAGNOSIS — I10 Essential (primary) hypertension: Secondary | ICD-10-CM | POA: Diagnosis not present

## 2020-07-08 DIAGNOSIS — J189 Pneumonia, unspecified organism: Secondary | ICD-10-CM

## 2020-07-08 DIAGNOSIS — Z9981 Dependence on supplemental oxygen: Secondary | ICD-10-CM

## 2020-07-08 DIAGNOSIS — I5032 Chronic diastolic (congestive) heart failure: Secondary | ICD-10-CM | POA: Diagnosis not present

## 2020-07-08 DIAGNOSIS — J45909 Unspecified asthma, uncomplicated: Secondary | ICD-10-CM | POA: Diagnosis not present

## 2020-07-08 DIAGNOSIS — Z794 Long term (current) use of insulin: Secondary | ICD-10-CM

## 2020-07-08 DIAGNOSIS — E1122 Type 2 diabetes mellitus with diabetic chronic kidney disease: Secondary | ICD-10-CM | POA: Diagnosis not present

## 2020-07-08 DIAGNOSIS — N183 Chronic kidney disease, stage 3 unspecified: Secondary | ICD-10-CM

## 2020-07-08 NOTE — Progress Notes (Signed)
Provider:  Rexene Edison. Mariea Clonts, D.O., C.M.D. Location:  [place] Room Number: [phone] Place of Service:  SNF ([phone])  PCP: [name], [name], MD Patient Care Team: [name], [name], MD as PCP - General (Family Medicine) Inocencio Homes, DPM as Consulting Physician (Podiatry) Adrian Prows, MD as Consulting Physician (Cardiology) Inda Castle, MD (Inactive) as Consulting Physician (Gastroenterology) Eli Hose, [facility] (Inactive) as Pharmacist (Pharmacist)  Extended Emergency Contact Information Primary Emergency Contact: [name] Address: [address], [place] [address] [place]te [place]itter of [place] Phone: [phone] Mobile Phone: [phone] Relation: Daughter Secondary Emergency Contact: Kerby Moors, [place] [address] [place] Phone: [phone] Relation: Daughter  Code Status: DNR Goals of Care: Advanced Directive information Advanced Directives 06/30/2020  Does Patient Have a Medical Advance Directive? No  Type of Advance Directive -  Does patient want to make changes to medical advance directive? -  Copy of [place] in Chart? -  Would patient like information on creating a medical advance directive? No - Patient declined  Pre-existing out of facility DNR order (yellow form or pink MOST form) -   Chief Complaint  Patient presents with  . New Admit To SNF    New Admission to [facility] SNF     HPI: Patient is a 80 y.o. female seen today for admission to [facility] status post hospitalization from December 19 to December 24 at [facility] for altered mental status, elevated liver function tests, and enlarged thyroid.  Ms. Brick has a past medical history significant for coronary artery disease status post PCI, morbid obesity, chronic diastolic heart failure, chronic hypoxic respiratory failure on 3 L of home oxygen, obstructive sleep apnea not on CPAP,  hypertension, hyperlipidemia, hypothyroidism, bipolar disorder, depression, fibromyalgia, stroke, gastroesophageal reflux disease, gout, sickle cell trait, lupus, type 2 diabetes, peripheral neuropathy, seizure disorder, chronic obstructive pulmonary disease, chronic kidney disease stage IIIb, and a history of left ventricle apical thrombus treated with 3 months of anticoagulation and Covid infection in March 2021.  Hospitalized from December 4 to December 14 with delirium felt to be secondary to UTI with a baseline underlying dementia.  She had received a 3-day course of ceftriaxone on MRI of her brain at that time was negative for acute findings a new onset A. fib and started on Eliquis.  Torsemide had been stopped due to acute renal failure.  Creatinine was up to 3.17 during that admission and did improve to 1.59 at the time of discharge with IV fluids.  In the emergency room, she was somnolent and only intermittently opening her eyes and moving all extremities.  She was not able to answer questions.  Her daughter was at bedside and reported that she had been previously living independently and performing most of her activities of daily living without difficulty.  Her sister lives close by it was helping with her medications.  She did have memory issues but was overall doing well.  This was prior to her hospitalization for UTI and subsequent rehab stay.  Friday when her daughter went to see her she appeared somnolent after staff had given her some medications for agitation.  The day before her ED visit her granddaughter went to check on her and she was short of breath.  She was also lethargic, confused and not responding.  She is not  moving her arms or her legs.  She had also been constipated but that have been treated at the nursing facility.  In the emergency room code stroke was activated and head CT showed chronic right MCA distribution infarct and underlying atrophy with mild chronic small vessel ischemic  disease, no acute intracranial abnormality.  Neurology recommended delirium work-up, brain MRI and EEG.  She was afebrile but tachypneic with need for 4 L of oxygen via nasal cannula White blood cell count was 13 hemoglobin was at baseline creatinine was 1.6 which is baseline.  U was elevated at 71.  Royd was within normal range.  Covid and flu panels were negative.  BNP was significantly elevated at 1752.  UA was negative as was a drug screen.  Chest x-ray did show pulmonary edema.  The angiogram was negative for pulmonary embolism but revealed an ill-defined right hilar soft tissue mass with adjacent right suprahilar peribronchial thickening concerning for a primary lung malignancy.  Mild right upper lobe and basilar atelectasis or infiltrate and small bilateral pleural effusions as well.  No enlargement of her left thyroid gland.  Abdominal x-ray was normal.  CT of the abdomen showed sigmoid diverticulosis, stable simple renal cysts, calcified uterine fibroids and aortic atherosclerosis.  In the ED she received IV Cardizem 10 mg, IV Lasix 40 mg, IM ziprasidone 20 mg and IV Keppra 1000 mg.  Neurology recommended continuing Keppra 500 mg p.o. twice daily.  It is noted that she used 3 L of home oxygen.  She was started on ceftriaxone and azithromycin for pneumonia for 5 days her sats returned to baseline of the 3 L requirement.  Apparently upon further discussion was noted that she had been started on Depakote newly for dementia behaviors.  This was stopped during hospitalization EEG showed moderate diffuse encephalopathy on December 21.  Keppra also seemed to contribute to somnolence so that was discontinued.  She was continued on gabapentin 100 mg at bedtime.  She had acute on chronic renal failure with a creatinine up to 2.34 after diuresis despite her Lasix and being held December 22.  She got IV fluid boluses and continuous IV fluid and only improved to 2.14.  As needed Lasix was suggested here.  She  had extremely poor solid food intake but was drinking fairly well so continue on Ensure boost 3-4 times a day.  Palliative care was consulted and started the discussion about her goals of care and poor prognosis.  Was recommended that this be continued here at [facility].  Her daughter, [name], had opted to pursue a nonaggressive approach to the new lung mass noted on her mother CT.  Note her iron, and melatonin tablets were also discontinued at the hospital.  She comes here with a sliding scale of NovoLog will need to reassess her insulin needs considering her intake is poor.  She also remained Ceftin 500 mg p.o. twice daily for 5 days at admission.  Blood culture showed no growth urine culture showed no growth.  Been vaccinated for Covid on April 2 and June 10 of 2021 Levan Hurst).  Here, creatinine improved to 1.5 December 27.  When seen, nurse was concerned about her rolling out of bed more than once.  She thought perhaps resident was trying to reposition herself in the bed to breathe better and that's why it happened.  Her daughter, [name], was present for the visit and noted that her mother has been more sob than she'd been.  We discussed the mass in her  lung also playing a role though she does currently appear volume overloaded and sats have dropped.  She's requiring more than her baseline 3L via [place].  Weights are actually fairly stable in here chart reading 273 12/27, 272 1/1 and 272.6 1/3; however for some reason 278 is entered (not sure of date of that weight).   The area on her bottom does not hurt her.  Her cognitive status has declined considerably amid her latest admission.  Discussed palliative care to assist with goals of care further and her daughter was definitely in agreement.  She certainly would qualify for hospice also with her suspected lung cancer, advanced dementia and declining status, prognosis, but basic planning not fully in place yet.   Past Medical History:  Diagnosis Date  . Acute  delirium 01/14/2017  . Anxiety   . Arthritis    "all over"  . Asthma   . Bipolar disorder ([place])   . Complication of anesthesia    "w/right foot OR they gave me too much and they couldn't get me woke"  . Congestive heart failure, unspecified   . Coronary artery calcification seen on CAT scan 01/14/2017  . Coronary artery disease    "I've got 1 stent" (06/30/2015)  . Depression   . Discoid lupus   . Fibromyalgia    "I've been told I have this; can't take Lyrica cause I'm allergic to it" (06/30/2015)  . GERD (gastroesophageal reflux disease)   . History of gout   . History of hiatal hernia    "real bad" (06/30/2015)  . Hypertension   . Hypothyroidism   . Memory loss   . On home oxygen therapy    "2L q hs" (06/30/2015)  . OSA (obstructive sleep apnea)    "just use my oxygen; no mask" (06/30/2015)  . Other and unspecified hyperlipidemia   . Penetrating foot wound    left nonhealing foot wound on the dorsal surface  . Peripheral neuropathy   . Pneumonia "many of times"  . Refusal of blood transfusions as patient is Jehovah's Witness   . Renal failure, unspecified    "kidneys not working 100%" (06/30/2015)  . Seizure ([place])   . Sickle cell trait ([place])   . Systemic lupus ([place])   . Thoracic aortic atherosclerosis ([place]) 01/14/2017  . Type II diabetes mellitus ([place])   . Unspecified vitamin D deficiency    Past Surgical History:  Procedure Laterality Date  . ANKLE ARTHROSCOPY WITH OPEN REDUCTION INTERNAL FIXATION (ORIF) Right 03/2011  . CARDIAC CATHETERIZATION N/A 07/01/2015   Procedure: Left Heart Cath and Coronary Angiography;  Surgeon: Adrian Prows, MD;  Location: [place] CV LAB;  Service: Cardiovascular;  Laterality: N/A;  . CARDIAC CATHETERIZATION N/A 07/01/2015   Procedure: Intravascular Pressure Wire/FFR Study;  Surgeon: Adrian Prows, MD;  Location: [place] [facility];  Service: Cardiovascular;  Laterality: N/A;  . CARPAL TUNNEL RELEASE Bilateral   . CATARACT EXTRACTION W/  INTRAOCULAR LENS  IMPLANT, BILATERAL Bilateral   . CHOLECYSTECTOMY OPEN    . COLONOSCOPY N/A 12/10/2013   Procedure: COLONOSCOPY;  Surgeon: Inda Castle, MD;  Location: [facility] ENDOSCOPY;  Service: Endoscopy;  Laterality: N/A;  . FRACTURE SURGERY    . INCISION AND DRAINAGE ABSCESS Left    foot "under my little toe"  . KNEE LIGAMENT RECONSTRUCTION Right   . LEFT HEART CATHETERIZATION WITH CORONARY ANGIOGRAM N/A 08/08/2012   Procedure: LEFT HEART CATHETERIZATION WITH CORONARY ANGIOGRAM;  Surgeon: [name], MD;  [facility]: [facility] CATH LAB;  Service: Cardiovascular;  Laterality: N/A;  . NASAL SINUS SURGERY    . PERCUTANEOUS CORONARY STENT INTERVENTION (PCI-S) N/A 08/21/2012   Procedure: PERCUTANEOUS CORONARY STENT INTERVENTION (PCI-S);  Surgeon: [name], MD;  Location: [facility] CATH LAB;  Service: Cardiovascular;  Laterality: N/A;  . TUBAL LIGATION      Social History   Socioeconomic History  . Marital status: Single    Spouse name: Not on file  . Number of children: 4  . Years of education: 61 th  . Highest education level: Not on file  Occupational History  . Occupation: Retired- Engineer, production    Employer: RETIRED  Tobacco Use  . Smoking status: Former Smoker    Packs/day: 3.00    Years: 40.00    Pack years: 120.00    Types: Cigarettes    Start date: 07/05/1960    Quit date: 07/05/1998    Years since quitting: 22.0  . Smokeless tobacco: Never Used  Vaping Use  . Vaping Use: Never used  Substance and Sexual Activity  . Alcohol use: No    Alcohol/week: 0.0 standard drinks    Comment: havent drank in over 40 years  . Drug use: No  . Sexual activity: Never  Other Topics Concern  . Not on file  Social History Narrative   Health Care POA:    Emergency Contact: daughter, Lorenso Courier [phone] or [name] [phone]   End of Life Plan: Does not want to be ventilated or feeding tubes.    Who lives with you: Lives alone   Any pets: none   Diet: Patient reports  enjoying and eating junk food.  Does not regulate types of food and currently her dentures are broken so it is hard to eat some foods.   Exercise: Patient does not have an exercise plan.   Seatbelts: Patient reports wearing seatbelt when in vehicle.   Sun Exposure/Protection: Patient reports not going outside very often.   Hobbies: Patient enjoys reading the bible and watching game shows.    Right-handed.   1 cup caffeine per day.   Former smoker-stopped 2000   Alcohol none   Social Determinants of Radio broadcast assistant Strain: Not on file  Food Insecurity: Not on file  Transportation Needs: Not on file  Physical Activity: Not on file  Stress: Not on file  Social Connections: Not on file    reports that she quit smoking about 22 years ago. Her smoking use included cigarettes. She started smoking about 60 years ago. She has a 120.00 pack-year smoking history. She has never used smokeless tobacco. She reports that she does not drink alcohol and does not use drugs.  Functional Status Survey:    Family History  Problem Relation Age of Onset  . Heart disease Mother   . Diabetes Mother   . Clotting disorder Mother   . Pneumonia Father   . Rheum arthritis Father   . Diabetes Sister   . Diabetes Sister   . Asthma Brother   . Cancer Brother   . Kidney disease Brother   . Lupus Son   . Heart disease Son   . Heart disease Daughter   . Colon cancer Neg Hx     Health Maintenance  Topic Date Due  . INFLUENZA VACCINE  02/03/2020  . COVID-19 Vaccine (3 - Booster for Pfizer series) 06/23/2020  . OPHTHALMOLOGY EXAM  08/13/2020  . FOOT EXAM  10/16/2020  . HEMOGLOBIN A1C  12/06/2020  . DEXA SCAN  Completed  . PNA vac Low Risk Adult  Completed  . TETANUS/TDAP  Discontinued    Allergies  Allergen Reactions  . Ace Inhibitors Other (See Comments)    Coincided with sig bump in creat. Retried and creat bumped again.   Marland Kitchen Penicillins Anaphylaxis, Swelling and Rash    Has patient  had a PCN reaction causing immediate rash, facial/tongue/throat swelling, SOB or lightheadedness with hypotension: Yes Has patient had a PCN reaction causing severe rash involving mucus membranes or skin necrosis: Yes Has patient had a PCN reaction that required hospitalization: Yes Has patient had a PCN reaction occurring within the last 10 years: No If all of the above answers are "NO", then may proceed with Cephalosporin use.   Marland Kitchen Buspirone Other (See Comments)    pain   . Isosorb Dinitrate-Hydralazine Other (See Comments)    Sleep all the time.   . Pregabalin Swelling  . Ropinirole Hydrochloride Swelling  . Amantadines Rash    "Swelling of the tongue"    Outpatient Encounter Medications as of 07/08/2020  Medication Sig  . acetaminophen (TYLENOL) 500 MG tablet Take 500 mg by mouth every 6 (six) hours as needed for moderate pain.  Marland Kitchen allopurinol (ZYLOPRIM) 100 MG tablet Take 2 tablets (200 mg total) by mouth daily.  Marland Kitchen amLODipine (NORVASC) 2.5 MG tablet Take 1 tablet (2.5 mg total) by mouth daily.  Marland Kitchen apixaban (ELIQUIS) 2.5 MG TABS tablet Take 1 tablet (2.5 mg total) by mouth 2 (two) times daily.  . Ascorbic Acid (VITAMIN C) 500 MG CAPS Take 1 capsule by mouth daily.  Marland Kitchen atorvastatin (LIPITOR) 20 MG tablet TAKE 1 TABLET(20 MG) BY MOUTH DAILY  . B Complex Vitamins (VITAMIN B COMPLEX) TABS Take 1 tablet by mouth daily.  . Cholecalciferol (VITAMIN D3) 125 MCG (5000 UT) TABS Take 5,000 Units by mouth daily.  . DULoxetine (CYMBALTA) 60 MG capsule Take 1 capsule (60 mg total) by mouth daily.  . feeding supplement (ENSURE ENLIVE / ENSURE PLUS) LIQD Take 237 mLs by mouth 4 (four) times daily.  . Fluocinolone Acetonide Scalp 0.01 % OIL Apply 1 application topically at bedtime as needed (itching scalp).   . Fluticasone-Salmeterol (ADVAIR) 250-50 MCG/DOSE AEPB Inhale 1 puff into the lungs 2 (two) times daily.  Marland Kitchen gabapentin (NEURONTIN) 100 MG capsule Take 1 capsule (100 mg total) by mouth at bedtime.   Marland Kitchen glucose blood (ACCU-CHEK GUIDE) test strip Use to test blood sugars 1-2 times daily. Dx: e11.9  . insulin aspart (NOVOLOG) 100 UNIT/ML injection Inject 0-9 Units into the skin 3 (three) times daily with meals. CBG 70 - 120: 0 units  CBG 121 - 150: 1 unit  CBG 151 - 200: 2 units  CBG 201 - 250: 3 units  CBG 251 - 300: 5 units  CBG 301 - 350: 7 units  CBG 351 - 400 9 units  . insulin glargine (LANTUS) 100 UNIT/ML injection Inject 0.14 mLs (14 Units total) into the skin daily.  . isosorbide mononitrate (IMDUR) 30 MG 24 hr tablet TAKE 1 TABLET(30 MG) BY MOUTH DAILY  . memantine (NAMENDA) 10 MG tablet Take 10 mg by mouth daily.  . metoprolol succinate (TOPROL-XL) 50 MG 24 hr tablet TAKE 1 TABLET BY MOUTH EVERY DAY. TAKE WITH OR IMMEDIATELY FOLLOWING A MEAL  . montelukast (SINGULAIR) 10 MG tablet TAKE 1 TABLET(10 MG) BY MOUTH AT BEDTIME  . Omega-3 Fatty Acids (FISH OIL) 1000 MG CAPS Take 1,000 mg by mouth daily.   . pantoprazole (PROTONIX)  40 MG tablet TAKE 1 TABLET(40 MG) BY MOUTH DAILY  . Polyethyl Glycol-Propyl Glycol (SYSTANE) 0.4-0.3 % GEL ophthalmic gel Place 1 application into both eyes every 4 (four) hours as needed (dry eyes).  Marland Kitchen PROAIR HFA 108 (90 Base) MCG/ACT inhaler INHALE 2 PUFF BY MOUTH EVERY 4 HOURS AS NEEDED USE ONLY IF YOU ARE WHEEZING   No facility-administered encounter medications on file as of 07/08/2020.    Review of Systems  Constitutional: Positive for malaise/fatigue. Negative for chills and fever.  HENT: Negative for congestion.   Eyes:       Eye contact poor [name] notes this has been since the admission)  Respiratory: Positive for shortness of breath. Negative for cough and wheezing.   Cardiovascular: Negative for chest pain, palpitations and leg swelling.  Gastrointestinal: Negative for abdominal pain, blood in stool and constipation.  Genitourinary: Negative for dysuria.  Musculoskeletal: Positive for falls. Negative for back pain and joint pain.   Neurological: Positive for weakness. Negative for dizziness and loss of consciousness.  Psychiatric/Behavioral: Positive for memory loss. Negative for depression. The patient is not nervous/anxious and does not have insomnia.     Vitals:   07/08/20 1004  BP: 137/75  Temp: (!) 97.5 F (36.4 C)  Weight: 278 lb 14.1 oz (126.5 kg)  Height: 5' 3.5" (1.613 m)   Body mass index is 48.63 kg/m. Physical Exam Vitals reviewed.  Constitutional:      Appearance: Normal appearance. She is obese. She is ill-appearing. She is not toxic-appearing.  HENT:     Head: Normocephalic and atraumatic.     Right Ear: External ear normal.     Left Ear: External ear normal.     Nose: Nose normal.     Mouth/Throat:     Pharynx: Oropharynx is clear.  Eyes:     Conjunctiva/sclera: Conjunctivae normal.     Pupils: Pupils are equal, round, and reactive to light.     Comments: Glasses; not looking right at me  Cardiovascular:     Rate and Rhythm: Rhythm irregular.     Heart sounds: No murmur heard.   Pulmonary:     Effort: Pulmonary effort is normal. No respiratory distress.     Breath sounds: Rales present.  Abdominal:     General: Bowel sounds are normal. There is distension.     Palpations: Abdomen is soft.     Tenderness: There is no abdominal tenderness. There is no guarding or rebound.  Musculoskeletal:        General: Normal range of motion.     Cervical back: Neck supple.     Right lower leg: No edema.     Left lower leg: No edema.  Skin:    General: Skin is warm and dry.     Comments: Left buttock open area--resident denies pain and unable to get her up to look at it without 2 person assistance with her weakness  Neurological:     General: No focal deficit present.     Mental Status: She is alert.     Motor: Weakness present.     Gait: Gait abnormal.  Psychiatric:     Comments: Not speaking much and could not really assess mood     Labs reviewed: Basic Metabolic Panel: Recent  Labs    11/04/19 0500 11/05/19 0531 12/07/19 0415 12/11/19 1612 06/13/20 0239 06/14/20 0358 06/25/20 0300 06/26/20 0319 06/27/20 0753 06/30/20 0000  NA 138   < > 138   < > 144   < >  149* 144 146* 146  K 5.4*   < > 3.8   < > 4.0   < > 3.2* 4.2 5.0 4.0  CL 112*   < > 103   < > 110   < > 111 110 115* 112*  CO2 20*   < > 27   < > 24   < > 27 23 18* 112*  GLUCOSE 224*   < > 141*   < > 167*   < > 131* 163* 148*  --   BUN 65*   < > 29*   < > 29*   < > 15 21 21 15   CREATININE 1.94*   < > 1.68*   < > 1.59*   < > 1.89* 2.34* 2.14* 1.5*  CALCIUM 8.9   < > 8.2*   < > 10.7*   < > 9.3 9.0 9.4 9.5  MG 2.3   < > 1.2*   < > 1.6*  --  1.5* 2.7*  --   --   PHOS 3.3  --  3.7  --   --   --   --   --   --   --    < > = values in this interval not displayed.   Liver Function Tests: Recent Labs    06/07/20 1458 06/22/20 1913 06/23/20 0921  AST 38 56* 48*  ALT 21 46* 43  ALKPHOS 97 94 89  BILITOT 0.8 1.2 0.9  PROT 7.5 7.2 6.8  ALBUMIN 3.4* 3.1* 3.0*   Recent Labs    12/05/19 1139  LIPASE 30   Recent Labs    06/22/20 2231 06/23/20 0921 06/25/20 0300  AMMONIA 71* 25 68*   CBC: Recent Labs    06/09/20 0228 06/10/20 0249 06/11/20 0428 06/22/20 2029 06/22/20 2035 06/24/20 0255 06/25/20 0300 06/26/20 0319  WBC 9.7 10.2   < > 12.9*   < > 12.9* 11.2* 10.2  NEUTROABS 6.0 6.0  --  8.8*  --   --   --   --   HGB 10.4* 9.5*   < > 9.8*   < > 10.0* 9.8* 9.7*  HCT 30.8* 28.3*   < > 29.0*   < > 28.7* 27.6* 27.3*  MCV 75.7* 75.7*   < > 76.9*   < > 76.3* 76.7* 75.8*  PLT 171 159   < > 184   < > 174 192 167   < > = values in this interval not displayed.   Cardiac Enzymes: Recent Labs    06/07/20 1458 06/08/20 0445  CKTOTAL 317* 301*   BNP: Invalid input(s): POCBNP Lab Results  Component Value Date   HGBA1C 8.5 (H) 06/07/2020   Lab Results  Component Value Date   TSH 0.840 06/22/2020   Lab Results  Component Value Date   VITAMINB12 2,994 (H) 06/24/2020   Lab Results   Component Value Date   FOLATE 14.0 04/05/2019   Lab Results  Component Value Date   IRON 52 04/30/2020   TIBC 229 (L) 04/30/2020   FERRITIN 658 (H) 11/06/2019    Imaging and Procedures obtained prior to SNF admission: EEG  Result Date: 06/23/2020 Lora Havens, MD     06/23/2020  1:43 PM Patient Name: MARCELENE WEIDEMANN MRN: [id number] Epilepsy Attending: Lora Havens Referring Physician/Provider: Dr Derrick Ravel Date: 06/23/2020 Duration: 27.45 mins Patient history: 80 year old female with a history of atrial fibrillation on Eliquis, seizure x 1, recent UTI with AMS, re-presenting  with altered mental status and left sided weakness. EEG to evaluate for seizure. Level of alertness: Awake AEDs during EEG study: LEv Technical aspects: This EEG study was done with scalp electrodes positioned according to the 10-20 International system of electrode placement. Electrical activity was acquired at a sampling rate of 500Hz  and reviewed with a high frequency filter of 70Hz  and a low frequency filter of 1Hz . EEG data were recorded continuously and digitally stored. Description: No posterior dominant rhythm was seen. EEG showed continuous generalized 3 to 6 Hz theta-delta slowing.  Hyperventilation and photic stimulation were not performed.   ABNORMALITY -Continuous slow, generalized IMPRESSION: This study is suggestive of moderate diffuse encephalopathy, nonspecific etiology. No seizures or epileptiform discharges were seen throughout the recording. Lora Havens    CT Angio Chest PE W and/or Wo Contrast  Result Date: 06/23/2020 CLINICAL DATA:  Altered mental status. EXAM: CT ANGIOGRAPHY CHEST WITH CONTRAST TECHNIQUE: Multidetector CT imaging of the chest was performed using the standard protocol during bolus administration of intravenous contrast. Multiplanar CT image reconstructions and MIPs were obtained to evaluate the vascular anatomy. CONTRAST:  76mL OMNIPAQUE IOHEXOL 350 MG/ML SOLN  COMPARISON:  January 13, 2017 FINDINGS: Cardiovascular: There is marked severity calcification of the aortic arch. Satisfactory opacification of the pulmonary arteries to the segmental level. No evidence of pulmonary embolism. There is mild cardiomegaly with marked severity coronary artery calcification. No pericardial effusion. Mediastinum/Nodes: Mild pretracheal and subcarinal lymphadenopathy is seen. Marked severity narrowing of the origin of the right main bronchus is noted (axial CT image 31, CT series number 6). An ill-defined right hilar soft tissue mass is seen which measures approximately 2.4 cm x 1.8 cm. Adjacent right suprahilar peribronchial thickening is also present (approximately 7.5 mm in thickness). Stable marked severity enlargement of the left lobe of the thyroid gland is seen. The esophagus demonstrates no significant findings. Lungs/Pleura: Mild atelectasis and/or infiltrate is seen within the right apex, posterior aspect of the right upper lobe and bilateral lung bases. Small bilateral pleural effusions are noted. No pneumothorax is identified. Upper Abdomen: Surgical clips are seen within the gallbladder fossa. Musculoskeletal: No chest wall abnormality. No acute or significant osseous findings. Review of the MIP images confirms the above findings. IMPRESSION: 1. No evidence of pulmonary embolism. 2. Ill-defined right hilar soft tissue mass with adjacent right suprahilar peribronchial thickening, concerning for the presence of a primary lung malignancy. 3. Mild right upper lobe and bibasilar atelectasis and/or infiltrate. 4. Small bilateral pleural effusions. 5. Stable marked severity enlargement of the left lobe of the thyroid gland. 6. Aortic atherosclerosis. Aortic Atherosclerosis (ICD10-I70.0). Electronically Signed   By: Virgina Norfolk M.D.   On: 06/23/2020 01:10   MR BRAIN WO CONTRAST  Result Date: 06/23/2020 CLINICAL DATA:  Delirium. Additional history provided: Altered mental  status. EXAM: MRI HEAD WITHOUT CONTRAST TECHNIQUE: Multiplanar, multiecho pulse sequences of the brain and surrounding structures were obtained without intravenous contrast. COMPARISON:  Noncontrast head CT 06/22/2020.  Brain MRI 06/12/2020. FINDINGS: Brain: Due to altered mental status, the patient was unable to tolerate the full examination. Only axial and coronal diffusion-weighted sequences, a sagittal T1 weighted sequence, an axial T2 weighted sequence and an axial T2/FLAIR sequence could be obtained. The acquired sequences are motion degraded. Most notably, the sagittal T1 weighted sequence is moderate to severely motion degraded. Mild cerebral and cerebellar atrophy. No evidence of acute infarction. Redemonstrated chronic cortically based infarct within the mid to anterior right frontal lobe. Background mild multifocal T2/FLAIR hyperintensity  within the cerebral white matter which is nonspecific, but compatible with chronic small vessel ischemic disease. No evidence of intracranial mass. No extra-axial fluid collection. No midline shift. Partially empty sella turcica. Vascular: Expected proximal arterial flow voids. Skull and upper cervical spine: Within described limitations, no focal marrow lesion is identified. Sinuses/Orbits: Visualized orbits show no acute finding. Mild mucosal thickening within the right maxillary sinus with associated prominent chronic reactive osteitis. IMPRESSION: Motion degraded and prematurely terminated examination as described. No evidence of acute infarction. Redemonstrated chronic cortically based right frontal lobe MCA territory infarct. Background mild generalized parenchymal atrophy and chronic small vessel ischemic disease. Chronic right maxillary sinusitis. Electronically Signed   By: Kellie Simmering DO   On: 06/23/2020 07:56   CT ABDOMEN PELVIS W CONTRAST  Result Date: 06/23/2020 CLINICAL DATA:  Altered mental status. EXAM: CT ABDOMEN AND PELVIS WITH CONTRAST TECHNIQUE:  Multidetector CT imaging of the abdomen and pelvis was performed using the standard protocol following bolus administration of intravenous contrast. CONTRAST:  82mL OMNIPAQUE IOHEXOL 350 MG/ML SOLN COMPARISON:  December 05, 2019 FINDINGS: The study is limited secondary to patient motion. Lower chest: Mild atelectasis and/or infiltrate is seen within the bilateral lung bases. Small bilateral pleural effusions are noted. Hepatobiliary: No focal liver abnormality is seen. Status post cholecystectomy. No biliary dilatation. Pancreas: Unremarkable. No pancreatic ductal dilatation or surrounding inflammatory changes. Spleen: Chronic curvilinear calcification is seen along the periphery of the spleen. Adrenals/Urinary Tract: Adrenal glands are unremarkable. Kidneys are normal in size, without renal calculi or hydronephrosis. Multiple stable bilateral simple renal cysts are seen. Bladder is unremarkable. Stomach/Bowel: Stomach is within normal limits. The appendix is not clearly identified. No evidence of bowel wall thickening, distention, or inflammatory changes. Noninflamed diverticula are seen within the sigmoid colon. Vascular/Lymphatic: There is marked severity aortic calcification and atherosclerosis. No enlarged abdominal or pelvic lymph nodes. Reproductive: Stable calcified uterine fibroids are present. The bilateral adnexa are unremarkable. Other: No abdominal wall hernia or abnormality. No abdominopelvic ascites. Musculoskeletal: Degenerative changes seen throughout the lumbar spine. IMPRESSION: 1. Mild atelectasis and/or infiltrate within the bilateral lung bases with small bilateral pleural effusions. 2. Sigmoid diverticulosis. 3. Stable bilateral simple renal cysts. 4. Calcified uterine fibroids. 5. Aortic atherosclerosis. Aortic Atherosclerosis (ICD10-I70.0). Electronically Signed   By: Virgina Norfolk M.D.   On: 06/23/2020 01:15   DG ABD ACUTE 2+V W 1V CHEST  Result Date: 06/22/2020 CLINICAL DATA:  Altered  level of consciousness EXAM: DG ABDOMEN ACUTE WITH 1 VIEW CHEST COMPARISON:  06/07/2020 FINDINGS: Supine and upright frontal views of the abdomen as well as an upright frontal view of the chest are obtained. The cardiac silhouette is enlarged but stable. Increased central vascular congestion and diffuse interstitial prominence, with developing consolidation at the lung bases. Small left effusion. No pneumothorax. Bowel gas pattern is unremarkable without obstruction or ileus. No masses or abnormal calcifications. No free gas in the greater peritoneal sac. IMPRESSION: 1. Worsening volume status, with developing pulmonary edema. 2. Unremarkable bowel gas pattern. Electronically Signed   By: Randa Ngo M.D.   On: 06/22/2020 23:21   US THYROID  Result Date: 06/23/2020 CLINICAL DATA:  Incidental on CT. 80 year old female presenting with altered mental status and enlarged left thyroid noted on chest CT. History of left thyroid biopsy on 07/31/2014. EXAM: THYROID ULTRASOUND TECHNIQUE: Ultrasound examination of the thyroid gland and adjacent soft tissues was performed. COMPARISON:  07/16/2014, 07/31/2014, chest CT from 06/23/2020 FINDINGS: Parenchymal Echotexture: Markedly heterogenous Isthmus: 0.6 cm, previously 0.6  cm Right lobe: 4.7 x 2.0 x 1.8 cm, previously 5.4 x 2.1 x 1.6 cm Left lobe: 7.5 x 4.3 x 6.9 cm, previously 9.0 x 4.7 x 5.4 cm _________________________________________________________ Estimated total number of nodules >/= 1 cm: 0 Number of spongiform nodules >/=  2 cm not described below (TR1): 0 Number of mixed cystic and solid nodules >/= 1.5 cm not described below ([place]): 0 _________________________________________________________ No discrete nodules are seen within the thyroid gland. Per sonographer note, this was a limited exam due to patient's mental status. IMPRESSION: Similar appearing diffusely heterogeneous thyroid gland. Asymmetrically enlarged left lobe without discrete nodule. Recommend  correlation with prior biopsy results. Per sonographer note, this was a limited exam due to patient's mental status. The above is in keeping with the ACR TI-RADS recommendations - J Am Coll Radiol 2017;14:587-595. Ruthann Cancer, MD [facility] Radiology Electronically Signed   By: Ruthann Cancer MD   On: 06/23/2020 08:00   CT HEAD CODE STROKE WO CONTRAST  Result Date: 06/22/2020 CLINICAL DATA:  Code stroke. Initial evaluation for neuro deficit, stroke suspected. EXAM: CT HEAD WITHOUT CONTRAST TECHNIQUE: Contiguous axial images were obtained from the base of the skull through the vertex without intravenous contrast. COMPARISON:  Prior MRI from 06/12/2020 FINDINGS: Brain: Examination mildly degraded by motion artifact. Age-related cerebral atrophy with chronic microvascular ischemic disease. Chronic right MCA distribution infarct involving the anterior right frontal lobe. No acute intracranial hemorrhage. No acute large vessel territory infarct. No mass lesion, midline shift or mass effect. No hydrocephalus or extra-axial fluid collection. Vascular: No hyperdense vessel. Scattered vascular calcifications noted within the carotid siphons. Skull: Scalp soft tissues and calvarium within normal limits. Sinuses/Orbits: Right gaze noted. Prior bilateral ocular lens replacement. Chronic right maxillary sinusitis. Mastoid air cells are clear. Other: None. ASPECTS [facility] Stroke Program Early CT Score) - Ganglionic level infarction (caudate, lentiform nuclei, internal capsule, insula, M1-M3 cortex): 7 - Supraganglionic infarction (M4-M6 cortex): 3 Total score (0-10 with 10 being normal): 10 IMPRESSION: 1. No acute intracranial infarct or other abnormality. 2. ASPECTS is 10. 3. Chronic right MCA distribution infarct. 4. Underlying atrophy with mild chronic small vessel ischemic disease. 5. Chronic right maxillary sinusitis. These results were communicated to Dr. Cheral Marker at  8:27 [place] 12/19/2021by text page via the [facility] messaging system. Electronically Signed   By: Jeannine Boga M.D.   On: 06/22/2020 20:28   US Abdomen Limited RUQ (LIVER/GB)  Result Date: 06/23/2020 CLINICAL DATA:  Elevated LFTs.  Cholecystectomy. EXAM: ULTRASOUND ABDOMEN LIMITED RIGHT UPPER QUADRANT COMPARISON:  CT 06/23/2020. FINDINGS: Gallbladder: Cholecystectomy. Common bile duct: Diameter: 9.2 mm. Mild common bile duct dilatation can be seen following cholecystectomy. Liver: Mild increased echogenicity suggesting fatty infiltration cannot be excluded. No focal hepatic abnormality identified. Portal vein is patent on color Doppler imaging with normal direction of blood flow towards the liver. Other: Multiple renal cysts with thin septation again noted with the largest measuring 9.6 cm. Mild amount of debris may be within the cyst. The cysts are most likely benign. Similar findings noted on prior CT. IMPRESSION: 1. Cholecystectomy. Common bile duct measures 9.2 mm. Mild prominence of the common bile duct can be seen following cholecystectomy. Further evaluation of common bile duct dilatation can be performed with MRCP as needed. No intrahepatic biliary ductal dilatation. 2. Mild increased hepatic echogenicity suggesting fatty infiltration. No focal hepatic abnormality identified. Electronically Signed   By: Marcello Moores  Register   On: 06/23/2020 07:24    Assessment/Plan 1. Lung  mass Palliative care to assist with goals of care -very complex lady with numerous underlying conditions and now possible lung mass -had mural thrombus, prior covid pneumonia, now has had CAP and not doing well--failing to thrive and does not have goals of care outlined fully  2. Community acquired pneumonia, unspecified laterality -treated and improved, but now volume overloaded off torsemide plus still has lung mass so hypoxia persists  3. Failure to thrive syndrome, adult -ongoing, likely has lung cancer and multiple  comorbidities  4. Dementia with behavioral disturbance, unspecified dementia type ([place]) -appears advanced, but may have been more moderate before latest illnesses from history from her daughter -cont SNF support  5. Chronic respiratory failure with hypoxia, on home O2 therapy (HCC) -monitor sats, adjust O2 to keep sats over 88%  -diurese due to abdominal distention and rales on exam  6. Chronic obstructive pulmonary disease, unspecified COPD type ([place]) -cont home regimen  7. Paroxysmal atrial fibrillation (HCC) -continue eliquis NOAC, toprol rate control  8. Morbid obesity ([place]) -contributory a well to respiratory compromise, has sleep apnea no longer using cpap  9. Controlled type 2 diabetes mellitus with stage 3 chronic kidney disease, with long-term current use of insulin (HCC) D/c cbgs ac and hs cbgs daily before lantus which I increased to 18 units D/c novolog  10. Fall from bed, initial encounter -likely due more to altered mentation, but hypoxia could be contributing, too, adjust oxygen as needed and will diurese  11. DNR (do not resuscitate) - Do not attempt resuscitation (DNR)  12. Acute on chronic congestive heart failure, unspecified heart failure type ([place]) Monitor sats  Diurese with torsemide  Family/ staff Communication: discussed with [name] and recommended MOST be completed with palliative care team; she likely will need hospice but I did not yet bring this up   Labs/tests ordered:  No new labs added today  Raylon Lamson L. Amdrew Oboyle, D.O. [employer] [facility] [address], [place] [address] Cell Phone (Mon-Fri 8am-5pm):  [phone] On Call:  [phone] & follow prompts after 5pm & weekends Office Phone:  [phone] Office Fax:  [phone]

## 2020-07-11 DIAGNOSIS — I5032 Chronic diastolic (congestive) heart failure: Secondary | ICD-10-CM | POA: Diagnosis not present

## 2020-07-14 DIAGNOSIS — M25869 Other specified joint disorders, unspecified knee: Secondary | ICD-10-CM | POA: Diagnosis not present

## 2020-07-14 DIAGNOSIS — I10 Essential (primary) hypertension: Secondary | ICD-10-CM | POA: Diagnosis not present

## 2020-07-14 DIAGNOSIS — J45909 Unspecified asthma, uncomplicated: Secondary | ICD-10-CM | POA: Diagnosis not present

## 2020-07-14 DIAGNOSIS — E875 Hyperkalemia: Secondary | ICD-10-CM | POA: Diagnosis not present

## 2020-07-16 ENCOUNTER — Encounter: Payer: Self-pay | Admitting: Orthopedic Surgery

## 2020-07-16 ENCOUNTER — Non-Acute Institutional Stay (SKILLED_NURSING_FACILITY): Payer: Medicare Other | Admitting: Orthopedic Surgery

## 2020-07-16 DIAGNOSIS — M109 Gout, unspecified: Secondary | ICD-10-CM | POA: Diagnosis not present

## 2020-07-16 DIAGNOSIS — N3000 Acute cystitis without hematuria: Secondary | ICD-10-CM | POA: Diagnosis not present

## 2020-07-16 DIAGNOSIS — I110 Hypertensive heart disease with heart failure: Secondary | ICD-10-CM | POA: Diagnosis not present

## 2020-07-16 DIAGNOSIS — R918 Other nonspecific abnormal finding of lung field: Secondary | ICD-10-CM

## 2020-07-16 DIAGNOSIS — J309 Allergic rhinitis, unspecified: Secondary | ICD-10-CM | POA: Diagnosis not present

## 2020-07-16 DIAGNOSIS — Z794 Long term (current) use of insulin: Secondary | ICD-10-CM

## 2020-07-16 DIAGNOSIS — I480 Paroxysmal atrial fibrillation: Secondary | ICD-10-CM | POA: Diagnosis not present

## 2020-07-16 DIAGNOSIS — J449 Chronic obstructive pulmonary disease, unspecified: Secondary | ICD-10-CM

## 2020-07-16 DIAGNOSIS — F0391 Unspecified dementia with behavioral disturbance: Secondary | ICD-10-CM

## 2020-07-16 DIAGNOSIS — E1122 Type 2 diabetes mellitus with diabetic chronic kidney disease: Secondary | ICD-10-CM

## 2020-07-16 DIAGNOSIS — I25118 Atherosclerotic heart disease of native coronary artery with other forms of angina pectoris: Secondary | ICD-10-CM

## 2020-07-16 DIAGNOSIS — N183 Chronic kidney disease, stage 3 unspecified: Secondary | ICD-10-CM

## 2020-07-16 DIAGNOSIS — R079 Chest pain, unspecified: Secondary | ICD-10-CM | POA: Diagnosis not present

## 2020-07-16 DIAGNOSIS — J9611 Chronic respiratory failure with hypoxia: Secondary | ICD-10-CM

## 2020-07-16 DIAGNOSIS — Z9981 Dependence on supplemental oxygen: Secondary | ICD-10-CM

## 2020-07-16 DIAGNOSIS — K219 Gastro-esophageal reflux disease without esophagitis: Secondary | ICD-10-CM

## 2020-07-16 MED ORDER — INSULIN GLARGINE 100 UNIT/ML ~~LOC~~ SOLN: 18.0000 [IU] | Freq: Every day | SUBCUTANEOUS | 0 refills | Status: DC

## 2020-07-16 MED ORDER — APIXABAN 2.5 MG PO TABS: 2.5000 mg | ORAL_TABLET | Freq: Two times a day (BID) | ORAL | 0 refills | Status: DC

## 2020-07-16 MED ORDER — FLUTICASONE-SALMETEROL 250-50 MCG/DOSE IN AEPB: 1.0000 | INHALATION_SPRAY | Freq: Two times a day (BID) | RESPIRATORY_TRACT | 0 refills | Status: DC

## 2020-07-16 MED ORDER — TORSEMIDE 20 MG PO TABS
20.0000 mg | ORAL_TABLET | Freq: Every day | ORAL | 0 refills | Status: DC
Start: 2020-07-16 — End: 2020-07-23

## 2020-07-16 MED ORDER — PANTOPRAZOLE SODIUM 40 MG PO TBEC: DELAYED_RELEASE_TABLET | ORAL | 0 refills | Status: DC

## 2020-07-16 MED ORDER — ATORVASTATIN CALCIUM 20 MG PO TABS
ORAL_TABLET | ORAL | 0 refills | Status: DC
Start: 2020-07-16 — End: 2020-07-23

## 2020-07-16 MED ORDER — AMLODIPINE BESYLATE 2.5 MG PO TABS: 2.5000 mg | ORAL_TABLET | Freq: Every day | ORAL | 0 refills | Status: DC

## 2020-07-16 MED ORDER — PROAIR HFA 108 (90 BASE) MCG/ACT IN AERS: INHALATION_SPRAY | RESPIRATORY_TRACT | 0 refills | Status: DC

## 2020-07-16 MED ORDER — NITROFURANTOIN MONOHYD MACRO 100 MG PO CAPS
100.0000 mg | ORAL_CAPSULE | Freq: Two times a day (BID) | ORAL | Status: AC
Start: 2020-07-16 — End: 2020-07-23

## 2020-07-16 MED ORDER — MONTELUKAST SODIUM 10 MG PO TABS
ORAL_TABLET | ORAL | 0 refills | Status: DC
Start: 2020-07-16 — End: 2020-07-23

## 2020-07-16 MED ORDER — MEMANTINE HCL 10 MG PO TABS: 10.0000 mg | ORAL_TABLET | Freq: Every day | ORAL | 0 refills | Status: DC

## 2020-07-16 MED ORDER — DULOXETINE HCL 60 MG PO CPEP: 60.0000 mg | ORAL_CAPSULE | Freq: Every day | ORAL | 0 refills | Status: DC

## 2020-07-16 MED ORDER — GABAPENTIN 100 MG PO CAPS: 100.0000 mg | ORAL_CAPSULE | Freq: Every day | ORAL | Status: DC

## 2020-07-16 MED ORDER — ALLOPURINOL 100 MG PO TABS: 200.0000 mg | ORAL_TABLET | Freq: Every day | ORAL | 0 refills | Status: DC

## 2020-07-16 MED ORDER — ISOSORBIDE MONONITRATE ER 30 MG PO TB24
ORAL_TABLET | ORAL | 0 refills | Status: DC
Start: 2020-07-16 — End: 2020-07-23

## 2020-07-16 MED ORDER — METOPROLOL SUCCINATE ER 50 MG PO TB24
ORAL_TABLET | ORAL | 0 refills | Status: DC
Start: 2020-07-16 — End: 2020-07-23

## 2020-07-16 NOTE — Progress Notes (Signed)
Location:  [facility] Room Number: [place] of Service:  SNF [phone]) Provider:  Windell Moulding, AGNP-C  [name], [name], MD  Patient Care Team: [name], [name], MD as PCP - General (Family Medicine) Inocencio Homes, [place] as Consulting Physician (Podiatry) Adrian Prows, MD as Consulting Physician (Cardiology) Inda Castle, MD (Inactive) as Consulting Physician (Gastroenterology) Eli Hose, [facility] (Inactive) as Pharmacist (Pharmacist)  Extended Emergency Contact Information Primary Emergency Contact: Brittany Roberts Address: [address], [place] [address] Brittany Roberts of [place] Phone: [phone] Mobile Phone: [phone] Relation: Daughter Secondary Emergency Contact: Brittany Roberts,  [address] [place] Phone: [phone] Relation: Daughter  Code Status:  DNR Goals of care: Advanced Directive information Advanced Directives 07/08/2020  Does Patient Have a Medical Advance Directive? Yes  Type of Advance Directive Out of facility DNR (pink MOST or yellow form)  Does patient want to make changes to medical advance directive? No - Patient declined  Copy of [place] in Chart? -  Would patient like information on creating a medical advance directive? -  Pre-existing out of facility DNR order (yellow form or pink MOST form) Pink MOST/Yellow Form most recent copy in chart - Physician notified to receive inpatient order     Chief Complaint  Patient presents with  . Discharge Note    HPI:  Pt is a 80 y.o. female seen today for discharge evaluation.   She has been a resident of [facility] since 06/27/20. PMH includes: CAD, heart failure, CVA, cardiomyopathy, atrial fibrillation, COPD, OSA, asthma, oxygen use, GERD, hypothyroidism, type 2 diabetes with insulin dependency, dementia, chronic kidney disease stage IV, and morbid obesity.   Prior to  hospitalizations in December 2021, she lived at home independently. Daughters main support systems, live nearby. She was hospitalized at [facility] 12/19- 12/24 for altered mental status, elevated liver function test and enlarged thyroid. In the ED, she was somnolent and not able to answer questions. Code stroke was activated and head CT showed chronic right MCA distribution infarct with underlying atrophy and mild chronic small vessel ischemic disease, no acute intracranial abnormality. Neuro consulted and worked up for delirium. EEG and MRI brian also recommended. CXR showed pulmonary edema. Angiogram negative for PE, but right hilar soft tissue mass with adjacent right suprahilar peribronchial thickening concerning for primary lung malignancy found. No enlargement of thyroid gland. She was give IV Cardizem, lasix, and Keppra. Ziprasidone IM given. Started on ceftriaxone and azithromycin for pneumonia, sats returned to normal and oxygen weaned to baseline 3 liters. Keppra eventually discontinued due to increased somnolence with EEG displaying diffuse encephalopathy. Palliative was consulted due to poor intake and overall progress. SNF recommended with daughter, Brittany Roberts opting a non aggressive approach to new lung mass.   During her stay at [facility], she has received PT/OT and skilled nursing services. At the beginning of her stay she remained somnolent, followed some commands. Appetite has slowly started to improve. She had a number of falls occurring at night without injury. Facility nurse reports she would be found on the side of the bed on floor mat. It was determined her diuretic was causing her to get up at night and was changed to AM administration. In addition, UA and culture positive for UTI, nitrofurantoin 100 mg twice daily for 10 days on 01/10. Blood sugars continue  to average 200-300. She has been receiving 18 units lantus at night. Received sliding scale insulin with meals during hospitalization.  Ambulating about 15 ft with walker. Minimal assist with transfers and ADL's.   Facility nurse does not report any concerns., vitals stable.   At this time, daughter are requesting to take her home. Today, Brittany Roberts is alert and oriented x 3. Follows commands and can express needs. She is very talkative during our visit. Expresses how happy she is to be going home.  Eating 2-3 meals daily. Moving bowels regularly. Using 3 liters oxygen. Denies chest pain or sob. I have advised her to follow up with PCP in 1-2 weeks for evaluation and lab work. Home health PT and OT have been ordered. Wheelchair ordered.   Past Medical History:  Diagnosis Date  . Acute delirium 01/14/2017  . Anxiety   . Arthritis    "all over"  . Asthma   . Bipolar disorder ([place])   . Complication of anesthesia    "w/right foot OR they gave me too much and they couldn't get me woke"  . Congestive heart failure, unspecified   . Coronary artery calcification seen on CAT scan 01/14/2017  . Coronary artery disease    "I've got 1 stent" (06/30/2015)  . Depression   . Discoid lupus   . Fibromyalgia    "I've been told I have this; can't take Lyrica cause I'm allergic to it" (06/30/2015)  . GERD (gastroesophageal reflux disease)   . History of gout   . History of hiatal hernia    "real bad" (06/30/2015)  . Hypertension   . Hypothyroidism   . Memory loss   . On home oxygen therapy    "2L q hs" (06/30/2015)  . OSA (obstructive sleep apnea)    "just use my oxygen; no mask" (06/30/2015)  . Other and unspecified hyperlipidemia   . Penetrating foot wound    left nonhealing foot wound on the dorsal surface  . Peripheral neuropathy   . Pneumonia "many of times"  . Refusal of blood transfusions as patient is Jehovah's Witness   . Renal failure, unspecified    "kidneys not working 100%" (06/30/2015)  . Seizure ([place])   . Sickle cell trait ([place])   . Systemic lupus ([place])   . Thoracic aortic atherosclerosis ([place]) 01/14/2017  . Type II  diabetes mellitus ([place])   . Unspecified vitamin D deficiency    Past Surgical History:  Procedure Laterality Date  . ANKLE ARTHROSCOPY WITH OPEN REDUCTION INTERNAL FIXATION (ORIF) Right 03/2011  . CARDIAC CATHETERIZATION N/A 07/01/2015   Procedure: Left Heart Cath and Coronary Angiography;  Surgeon: Adrian Prows, MD;  Location: [facility];  Service: Cardiovascular;  Laterality: N/A;  . CARDIAC CATHETERIZATION N/A 07/01/2015   Procedure: Intravascular Pressure Wire/FFR Study;  Surgeon: Adrian Prows, MD;  Location: [facility];  Service: Cardiovascular;  Laterality: N/A;  . CARPAL TUNNEL RELEASE Bilateral   . CATARACT EXTRACTION W/ INTRAOCULAR LENS  IMPLANT, BILATERAL Bilateral   . CHOLECYSTECTOMY OPEN    . COLONOSCOPY N/A 12/10/2013   Procedure: COLONOSCOPY;  Surgeon: Inda Castle, MD;  Location: [facility];  Service: Endoscopy;  Laterality: N/A;  . FRACTURE SURGERY    . INCISION AND DRAINAGE ABSCESS Left    foot "under my little toe"  . KNEE LIGAMENT RECONSTRUCTION Right   . LEFT HEART CATHETERIZATION WITH CORONARY ANGIOGRAM N/A 08/08/2012   Procedure: LEFT HEART CATHETERIZATION WITH CORONARY ANGIOGRAM;  Surgeon: [name], MD;  Location: [facility];  Service: Cardiovascular;  Laterality: N/A;  . NASAL SINUS SURGERY    . PERCUTANEOUS CORONARY STENT INTERVENTION (PCI-S) N/A 08/21/2012   Procedure: PERCUTANEOUS CORONARY STENT INTERVENTION (PCI-S);  Surgeon: [name], MD;  Location: [facility] CATH LAB;  Service: Cardiovascular;  Laterality: N/A;  . TUBAL LIGATION      Allergies  Allergen Reactions  . Ace Inhibitors Other (See Comments)    Coincided with sig bump in creat. Retried and creat bumped again.   Marland Kitchen Penicillins Anaphylaxis, Swelling and Rash    Has patient had a PCN reaction causing immediate rash, facial/tongue/throat swelling, SOB or lightheadedness with hypotension: Yes Has patient had a PCN reaction causing severe rash involving mucus membranes or skin  necrosis: Yes Has patient had a PCN reaction that required hospitalization: Yes Has patient had a PCN reaction occurring within the last 10 years: No If all of the above answers are "NO", then may proceed with Cephalosporin use.   Marland Kitchen Buspirone Other (See Comments)    pain   . Isosorb Dinitrate-Hydralazine Other (See Comments)    Sleep all the time.   . Pregabalin Swelling  . Ropinirole Hydrochloride Swelling  . Amantadines Rash    "Swelling of the tongue"    Outpatient Encounter Medications as of 07/16/2020  Medication Sig  . acetaminophen (TYLENOL) 500 MG tablet Take 500 mg by mouth every 6 (six) hours as needed for moderate pain.  Marland Kitchen allopurinol (ZYLOPRIM) 100 MG tablet Take 2 tablets (200 mg total) by mouth daily.  Marland Kitchen amLODipine (NORVASC) 2.5 MG tablet Take 1 tablet (2.5 mg total) by mouth daily.  Marland Kitchen apixaban (ELIQUIS) 2.5 MG TABS tablet Take 1 tablet (2.5 mg total) by mouth 2 (two) times daily.  . Ascorbic Acid (VITAMIN C) 500 MG CAPS Take 1 capsule by mouth daily.  Marland Kitchen atorvastatin (LIPITOR) 20 MG tablet TAKE 1 TABLET(20 MG) BY MOUTH DAILY  . B Complex Vitamins (VITAMIN B COMPLEX) TABS Take 1 tablet by mouth daily.  . Cholecalciferol (VITAMIN D3) 125 MCG (5000 UT) TABS Take 5,000 Units by mouth daily.  . DULoxetine (CYMBALTA) 60 MG capsule Take 1 capsule (60 mg total) by mouth daily.  . feeding supplement (ENSURE ENLIVE / ENSURE PLUS) LIQD Take 237 mLs by mouth 4 (four) times daily.  . Fluocinolone Acetonide Scalp 0.01 % OIL Apply 1 application topically at bedtime as needed (itching scalp).   . Fluticasone-Salmeterol (ADVAIR) 250-50 MCG/DOSE AEPB Inhale 1 puff into the lungs 2 (two) times daily.  Marland Kitchen gabapentin (NEURONTIN) 100 MG capsule Take 1 capsule (100 mg total) by mouth at bedtime.  Marland Kitchen glucose blood (ACCU-CHEK GUIDE) test strip Use to test blood sugars 1-2 times daily. Dx: e11.9  . insulin aspart (NOVOLOG) 100 UNIT/ML injection Inject 0-9 Units into the skin 3 (three) times daily  with meals. CBG 70 - 120: 0 units  CBG 121 - 150: 1 unit  CBG 151 - 200: 2 units  CBG 201 - 250: 3 units  CBG 251 - 300: 5 units  CBG 301 - 350: 7 units  CBG 351 - 400 9 units  . insulin glargine (LANTUS) 100 UNIT/ML injection Inject 0.14 mLs (14 Units total) into the skin daily.  . isosorbide mononitrate (IMDUR) 30 MG 24 hr tablet TAKE 1 TABLET(30 MG) BY MOUTH DAILY  . melatonin 5 MG TABS Take 5 mg by mouth.  . memantine (NAMENDA) 10 MG tablet Take 10 mg by mouth daily.  . metoprolol succinate (TOPROL-XL) 50 MG  24 hr tablet TAKE 1 TABLET BY MOUTH EVERY DAY. TAKE WITH OR IMMEDIATELY FOLLOWING A MEAL  . montelukast (SINGULAIR) 10 MG tablet TAKE 1 TABLET(10 MG) BY MOUTH AT BEDTIME  . Omega-3 Fatty Acids (FISH OIL) 1000 MG CAPS Take 1,000 mg by mouth daily.   . pantoprazole (PROTONIX) 40 MG tablet TAKE 1 TABLET(40 MG) BY MOUTH DAILY  . Polyethyl Glycol-Propyl Glycol (SYSTANE) 0.4-0.3 % GEL ophthalmic gel Place 1 application into both eyes every 4 (four) hours as needed (dry eyes).  Marland Kitchen PROAIR HFA 108 (90 Base) MCG/ACT inhaler INHALE 2 PUFF BY MOUTH EVERY 4 HOURS AS NEEDED USE ONLY IF YOU ARE WHEEZING   No facility-administered encounter medications on file as of 07/16/2020.    Review of Systems  Constitutional: Negative for activity change, appetite change and fever.  HENT: Negative for congestion, hearing loss and trouble swallowing.        Missing teeth  Eyes: Negative for photophobia.       Glasses  Respiratory: Negative for cough, shortness of breath and wheezing.        Oxygen use  Cardiovascular: Negative for chest pain and leg swelling.  Gastrointestinal: Negative for abdominal distention, abdominal pain, constipation, diarrhea and nausea.  Endocrine: Negative for polydipsia, polyphagia and polyuria.  Genitourinary: Negative for dysuria, frequency and hematuria.  Musculoskeletal: Positive for arthralgias and myalgias.  Skin:       Dry skin  Neurological: Positive for weakness and  numbness. Negative for dizziness and headaches.  Psychiatric/Behavioral: Positive for confusion. Negative for dysphoric mood and sleep disturbance. The patient is not nervous/anxious.     Immunization History  Administered Date(s) Administered  . Fluad Quad(high Dose 65+) 03/14/2019  . Influenza Split 04/03/2012, 02/24/2016  . Influenza Whole 05/07/2009, 05/08/2010, 03/15/2011  . Influenza, High Dose Seasonal PF 04/08/2017, 03/15/2018  . Influenza,inj,Quad PF,6+ Mos 04/25/2013, 04/05/2014, 06/05/2015  . PFIZER SARS-COV-2 Vaccination 10/04/2019, 12/23/2019  . Pneumococcal Conjugate-13 08/10/2013  . Pneumococcal Polysaccharide-23 08/05/2000, 02/26/2009, 02/24/2016  . Td 08/05/2005   Pertinent  Health Maintenance Due  Topic Date Due  . OPHTHALMOLOGY EXAM  08/13/2020  . FOOT EXAM  10/16/2020  . HEMOGLOBIN A1C  12/06/2020  . INFLUENZA VACCINE  Completed  . DEXA SCAN  Completed  . PNA vac Low Risk Adult  Completed   Fall Risk  05/22/2020 07/17/2019 03/14/2019 01/01/2019 03/15/2018  Falls in the past year? 0 1 0 1 No  Number falls in past yr: - 1 0 1 -  Injury with Fall? - - 0 1 -  Risk Factor Category  - - - - -  Risk for fall due to : - History of fall(s);Impaired balance/gait;Impaired mobility;Medication side effect;Other (Comment) Impaired balance/gait;Orthopedic patient;Impaired mobility Impaired balance/gait -  Risk for fall due to: Comment - cardiac condition - - -  Follow up - Falls evaluation completed;Education provided;Falls prevention discussed Education provided Falls evaluation completed -   Functional Status Survey:    Vitals:   07/16/20 1219  BP: (!) 145/71  Pulse: 65  Resp: 16  Temp: 97.8 F (36.6 C)  Weight: 272 lb (123.4 kg)   Body mass index is 47.43 kg/m. Physical Exam Vitals reviewed.  Constitutional:      General: She is not in acute distress.    Appearance: She is obese.  HENT:     Head: Normocephalic.     Right Ear: There is no impacted cerumen.      Left Ear: There is no impacted cerumen.  Nose: Nose normal.     Mouth/Throat:     Mouth: Mucous membranes are moist.     Pharynx: No posterior oropharyngeal erythema.  Eyes:     General:        Right eye: No discharge.        Left eye: No discharge.  Cardiovascular:     Rate and Rhythm: Normal rate. Rhythm irregular.     Pulses: Normal pulses.     Heart sounds: Normal heart sounds. No murmur heard.   Pulmonary:     Effort: Pulmonary effort is normal.     Breath sounds: Examination of the right-lower field reveals decreased breath sounds. Examination of the left-lower field reveals decreased breath sounds. Decreased breath sounds present.  Abdominal:     General: Bowel sounds are normal. There is no distension.     Palpations: Abdomen is soft.     Tenderness: There is no abdominal tenderness.  Musculoskeletal:     Cervical back: Normal range of motion.     Right lower leg: No edema.     Left lower leg: No edema.  Lymphadenopathy:     Cervical: No cervical adenopathy.  Skin:    General: Skin is warm and dry.     Capillary Refill: Capillary refill takes less than 2 seconds.  Neurological:     General: No focal deficit present.     Mental Status: She is alert. Mental status is at baseline.     Motor: Weakness present.     Gait: Gait abnormal.     Comments: Walker, wheelchair  Psychiatric:        Mood and Affect: Mood normal.        Behavior: Behavior normal.        Cognition and Memory: Memory is impaired.     Labs reviewed: Recent Labs    11/04/19 0500 11/05/19 0531 12/07/19 0415 12/11/19 1612 06/13/20 0239 06/14/20 0358 06/25/20 0300 06/26/20 0319 06/27/20 0753 06/30/20 0000  NA 138   < > 138   < > 144   < > 149* 144 146* 146  K 5.4*   < > 3.8   < > 4.0   < > 3.2* 4.2 5.0 4.0  CL 112*   < > 103   < > 110   < > 111 110 115* 112*  CO2 20*   < > 27   < > 24   < > 27 23 18* 112*  GLUCOSE 224*   < > 141*   < > 167*   < > 131* 163* 148*  --   BUN 65*   < >  29*   < > 29*   < > 15 21 21 15   CREATININE 1.94*   < > 1.68*   < > 1.59*   < > 1.89* 2.34* 2.14* 1.5*  CALCIUM 8.9   < > 8.2*   < > 10.7*   < > 9.3 9.0 9.4 9.5  MG 2.3   < > 1.2*   < > 1.6*  --  1.5* 2.7*  --   --   PHOS 3.3  --  3.7  --   --   --   --   --   --   --    < > = values in this interval not displayed.   Recent Labs    06/07/20 1458 06/22/20 1913 06/23/20 0921  AST 38 56* 48*  ALT 21 46* 43  ALKPHOS 97 94 89  BILITOT 0.8 1.2 0.9  PROT 7.5 7.2 6.8  ALBUMIN 3.4* 3.1* 3.0*   Recent Labs    06/09/20 0228 06/10/20 0249 06/11/20 0428 06/22/20 2029 06/22/20 2035 06/24/20 0255 06/25/20 0300 06/26/20 0319  WBC 9.7 10.2   < > 12.9*   < > 12.9* 11.2* 10.2  NEUTROABS 6.0 6.0  --  8.8*  --   --   --   --   HGB 10.4* 9.5*   < > 9.8*   < > 10.0* 9.8* 9.7*  HCT 30.8* 28.3*   < > 29.0*   < > 28.7* 27.6* 27.3*  MCV 75.7* 75.7*   < > 76.9*   < > 76.3* 76.7* 75.8*  PLT 171 159   < > 184   < > 174 192 167   < > = values in this interval not displayed.   Lab Results  Component Value Date   TSH 0.840 06/22/2020   Lab Results  Component Value Date   HGBA1C 8.5 (H) 06/07/2020   Lab Results  Component Value Date   CHOL 103 07/19/2014   HDL 38 (L) 07/19/2014   LDLCALC 49 07/19/2014   LDLDIRECT 59 02/16/2013   TRIG 81 07/19/2014   CHOLHDL 2.7 07/19/2014    Significant Diagnostic Results in last 30 days:  EEG  Result Date: 06/23/2020 Lora Havens, MD     06/23/2020  1:43 PM Patient Name: TINSLEY EVERMAN MRN: [id number] Epilepsy Attending: Lora Havens Referring Physician/Provider: Dr Derrick Ravel Date: 06/23/2020 Duration: 27.45 mins Patient history: 80 year old female with a history of atrial fibrillation on Eliquis, seizure x 1, recent UTI with AMS, re-presenting with altered mental status and left sided weakness. EEG to evaluate for seizure. Level of alertness: Awake AEDs during EEG study: LEv Technical aspects: This EEG study was done with scalp electrodes  positioned according to the 10-20 International system of electrode placement. Electrical activity was acquired at a sampling rate of 500Hz  and reviewed with a high frequency filter of 70Hz  and a low frequency filter of 1Hz . EEG data were recorded continuously and digitally stored. Description: No posterior dominant rhythm was seen. EEG showed continuous generalized 3 to 6 Hz theta-delta slowing.  Hyperventilation and photic stimulation were not performed.   ABNORMALITY -Continuous slow, generalized IMPRESSION: This study is suggestive of moderate diffuse encephalopathy, nonspecific etiology. No seizures or epileptiform discharges were seen throughout the recording. Lora Havens    CT Angio Chest PE W and/or Wo Contrast  Result Date: 06/23/2020 CLINICAL DATA:  Altered mental status. EXAM: CT ANGIOGRAPHY CHEST WITH CONTRAST TECHNIQUE: Multidetector CT imaging of the chest was performed using the standard protocol during bolus administration of intravenous contrast. Multiplanar CT image reconstructions and MIPs were obtained to evaluate the vascular anatomy. CONTRAST:  7mL OMNIPAQUE IOHEXOL 350 MG/ML SOLN COMPARISON:  January 13, 2017 FINDINGS: Cardiovascular: There is marked severity calcification of the aortic arch. Satisfactory opacification of the pulmonary arteries to the segmental level. No evidence of pulmonary embolism. There is mild cardiomegaly with marked severity coronary artery calcification. No pericardial effusion. Mediastinum/Nodes: Mild pretracheal and subcarinal lymphadenopathy is seen. Marked severity narrowing of the origin of the right main bronchus is noted (axial CT image 31, CT series number 6). An ill-defined right hilar soft tissue mass is seen which measures approximately 2.4 cm x 1.8 cm. Adjacent right suprahilar peribronchial thickening is also present (approximately 7.5 mm in thickness). Stable marked severity enlargement of the left lobe of the thyroid gland  is seen. The esophagus  demonstrates no significant findings. Lungs/Pleura: Mild atelectasis and/or infiltrate is seen within the right apex, posterior aspect of the right upper lobe and bilateral lung bases. Small bilateral pleural effusions are noted. No pneumothorax is identified. Upper Abdomen: Surgical clips are seen within the gallbladder fossa. Musculoskeletal: No chest wall abnormality. No acute or significant osseous findings. Review of the MIP images confirms the above findings. IMPRESSION: 1. No evidence of pulmonary embolism. 2. Ill-defined right hilar soft tissue mass with adjacent right suprahilar peribronchial thickening, concerning for the presence of a primary lung malignancy. 3. Mild right upper lobe and bibasilar atelectasis and/or infiltrate. 4. Small bilateral pleural effusions. 5. Stable marked severity enlargement of the left lobe of the thyroid gland. 6. Aortic atherosclerosis. Aortic Atherosclerosis (ICD10-I70.0). Electronically Signed   By: Virgina Norfolk M.D.   On: 06/23/2020 01:10   MR BRAIN WO CONTRAST  Result Date: 06/23/2020 CLINICAL DATA:  Delirium. Additional history provided: Altered mental status. EXAM: MRI HEAD WITHOUT CONTRAST TECHNIQUE: Multiplanar, multiecho pulse sequences of the brain and surrounding structures were obtained without intravenous contrast. COMPARISON:  Noncontrast head CT 06/22/2020.  Brain MRI 06/12/2020. FINDINGS: Brain: Due to altered mental status, the patient was unable to tolerate the full examination. Only axial and coronal diffusion-weighted sequences, a sagittal T1 weighted sequence, an axial T2 weighted sequence and an axial T2/FLAIR sequence could be obtained. The acquired sequences are motion degraded. Most notably, the sagittal T1 weighted sequence is moderate to severely motion degraded. Mild cerebral and cerebellar atrophy. No evidence of acute infarction. Redemonstrated chronic cortically based infarct within the mid to anterior right frontal lobe. Background  mild multifocal T2/FLAIR hyperintensity within the cerebral white matter which is nonspecific, but compatible with chronic small vessel ischemic disease. No evidence of intracranial mass. No extra-axial fluid collection. No midline shift. Partially empty sella turcica. Vascular: Expected proximal arterial flow voids. Skull and upper cervical spine: Within described limitations, no focal marrow lesion is identified. Sinuses/Orbits: Visualized orbits show no acute finding. Mild mucosal thickening within the right maxillary sinus with associated prominent chronic reactive osteitis. IMPRESSION: Motion degraded and prematurely terminated examination as described. No evidence of acute infarction. Redemonstrated chronic cortically based right frontal lobe MCA territory infarct. Background mild generalized parenchymal atrophy and chronic small vessel ischemic disease. Chronic right maxillary sinusitis. Electronically Signed   By: Kellie Simmering DO   On: 06/23/2020 07:56   CT ABDOMEN PELVIS W CONTRAST  Result Date: 06/23/2020 CLINICAL DATA:  Altered mental status. EXAM: CT ABDOMEN AND PELVIS WITH CONTRAST TECHNIQUE: Multidetector CT imaging of the abdomen and pelvis was performed using the standard protocol following bolus administration of intravenous contrast. CONTRAST:  52mL OMNIPAQUE IOHEXOL 350 MG/ML SOLN COMPARISON:  December 05, 2019 FINDINGS: The study is limited secondary to patient motion. Lower chest: Mild atelectasis and/or infiltrate is seen within the bilateral lung bases. Small bilateral pleural effusions are noted. Hepatobiliary: No focal liver abnormality is seen. Status post cholecystectomy. No biliary dilatation. Pancreas: Unremarkable. No pancreatic ductal dilatation or surrounding inflammatory changes. Spleen: Chronic curvilinear calcification is seen along the periphery of the spleen. Adrenals/Urinary Tract: Adrenal glands are unremarkable. Kidneys are normal in size, without renal calculi or  hydronephrosis. Multiple stable bilateral simple renal cysts are seen. Bladder is unremarkable. Stomach/Bowel: Stomach is within normal limits. The appendix is not clearly identified. No evidence of bowel wall thickening, distention, or inflammatory changes. Noninflamed diverticula are seen within the sigmoid colon. Vascular/Lymphatic: There is marked severity aortic calcification and  atherosclerosis. No enlarged abdominal or pelvic lymph nodes. Reproductive: Stable calcified uterine fibroids are present. The bilateral adnexa are unremarkable. Other: No abdominal wall hernia or abnormality. No abdominopelvic ascites. Musculoskeletal: Degenerative changes seen throughout the lumbar spine. IMPRESSION: 1. Mild atelectasis and/or infiltrate within the bilateral lung bases with small bilateral pleural effusions. 2. Sigmoid diverticulosis. 3. Stable bilateral simple renal cysts. 4. Calcified uterine fibroids. 5. Aortic atherosclerosis. Aortic Atherosclerosis (ICD10-I70.0). Electronically Signed   By: Virgina Norfolk M.D.   On: 06/23/2020 01:15   DG ABD ACUTE 2+V W 1V CHEST  Result Date: 06/22/2020 CLINICAL DATA:  Altered level of consciousness EXAM: DG ABDOMEN ACUTE WITH 1 VIEW CHEST COMPARISON:  06/07/2020 FINDINGS: Supine and upright frontal views of the abdomen as well as an upright frontal view of the chest are obtained. The cardiac silhouette is enlarged but stable. Increased central vascular congestion and diffuse interstitial prominence, with developing consolidation at the lung bases. Small left effusion. No pneumothorax. Bowel gas pattern is unremarkable without obstruction or ileus. No masses or abnormal calcifications. No free gas in the greater peritoneal sac. IMPRESSION: 1. Worsening volume status, with developing pulmonary edema. 2. Unremarkable bowel gas pattern. Electronically Signed   By: Randa Ngo M.D.   On: 06/22/2020 23:21   US THYROID  Result Date: 06/23/2020 CLINICAL DATA:   Incidental on CT. 80 year old female presenting with altered mental status and enlarged left thyroid noted on chest CT. History of left thyroid biopsy on 07/31/2014. EXAM: THYROID ULTRASOUND TECHNIQUE: Ultrasound examination of the thyroid gland and adjacent soft tissues was performed. COMPARISON:  07/16/2014, 07/31/2014, chest CT from 06/23/2020 FINDINGS: Parenchymal Echotexture: Markedly heterogenous Isthmus: 0.6 cm, previously 0.6 cm Right lobe: 4.7 x 2.0 x 1.8 cm, previously 5.4 x 2.1 x 1.6 cm Left lobe: 7.5 x 4.3 x 6.9 cm, previously 9.0 x 4.7 x 5.4 cm _________________________________________________________ Estimated total number of nodules >/= 1 cm: 0 Number of spongiform nodules >/=  2 cm not described below (TR1): 0 Number of mixed cystic and solid nodules >/= 1.5 cm not described below ([place]): 0 _________________________________________________________ No discrete nodules are seen within the thyroid gland. Per sonographer note, this was a limited exam due to patient's mental status. IMPRESSION: Similar appearing diffusely heterogeneous thyroid gland. Asymmetrically enlarged left lobe without discrete nodule. Recommend correlation with prior biopsy results. Per sonographer note, this was a limited exam due to patient's mental status. The above is in keeping with the ACR TI-RADS recommendations - J Am Coll Radiol 2017;14:587-595. Ruthann Cancer, MD [facility] Radiology Electronically Signed   By: Ruthann Cancer MD   On: 06/23/2020 08:00   CT HEAD CODE STROKE WO CONTRAST  Result Date: 06/22/2020 CLINICAL DATA:  Code stroke. Initial evaluation for neuro deficit, stroke suspected. EXAM: CT HEAD WITHOUT CONTRAST TECHNIQUE: Contiguous axial images were obtained from the base of the skull through the vertex without intravenous contrast. COMPARISON:  Prior MRI from 06/12/2020 FINDINGS: Brain: Examination mildly degraded by motion artifact. Age-related cerebral  atrophy with chronic microvascular ischemic disease. Chronic right MCA distribution infarct involving the anterior right frontal lobe. No acute intracranial hemorrhage. No acute large vessel territory infarct. No mass lesion, midline shift or mass effect. No hydrocephalus or extra-axial fluid collection. Vascular: No hyperdense vessel. Scattered vascular calcifications noted within the carotid siphons. Skull: Scalp soft tissues and calvarium within normal limits. Sinuses/Orbits: Right gaze noted. Prior bilateral ocular lens replacement. Chronic right maxillary sinusitis. Mastoid air cells are clear. Other: None. ASPECTS [facility]  Stroke Program Early CT Score) - Ganglionic level infarction (caudate, lentiform nuclei, internal capsule, insula, M1-M3 cortex): 7 - Supraganglionic infarction (M4-M6 cortex): 3 Total score (0-10 with 10 being normal): 10 IMPRESSION: 1. No acute intracranial infarct or other abnormality. 2. ASPECTS is 10. 3. Chronic right MCA distribution infarct. 4. Underlying atrophy with mild chronic small vessel ischemic disease. 5. Chronic right maxillary sinusitis. These results were communicated to Dr. Cheral Marker at 8:27 [place] 12/19/2021by text page via the [facility] messaging system. Electronically Signed   By: Jeannine Boga M.D.   On: 06/22/2020 20:28   US Abdomen Limited RUQ (LIVER/GB)  Result Date: 06/23/2020 CLINICAL DATA:  Elevated LFTs.  Cholecystectomy. EXAM: ULTRASOUND ABDOMEN LIMITED RIGHT UPPER QUADRANT COMPARISON:  CT 06/23/2020. FINDINGS: Gallbladder: Cholecystectomy. Common bile duct: Diameter: 9.2 mm. Mild common bile duct dilatation can be seen following cholecystectomy. Liver: Mild increased echogenicity suggesting fatty infiltration cannot be excluded. No focal hepatic abnormality identified. Portal vein is patent on color Doppler imaging with normal direction of blood flow towards the liver. Other: Multiple renal cysts with thin septation again noted with the largest measuring  9.6 cm. Mild amount of debris may be within the cyst. The cysts are most likely benign. Similar findings noted on prior CT. IMPRESSION: 1. Cholecystectomy. Common bile duct measures 9.2 mm. Mild prominence of the common bile duct can be seen following cholecystectomy. Further evaluation of common bile duct dilatation can be performed with MRCP as needed. No intrahepatic biliary ductal dilatation. 2. Mild increased hepatic echogenicity suggesting fatty infiltration. No focal hepatic abnormality identified. Electronically Signed   By: Marcello Moores  Register   On: 06/23/2020 07:24    Assessment/Plan 1. Gout, arthropathy - stable, no recent flares, continue allopurinol - allopurinol (ZYLOPRIM) 100 MG tablet; Take 2 tablets (200 mg total) by mouth daily.  Dispense: 60 tablet; Refill: 0  2. Chest pain of uncertain etiology - stable with no recent incidents, continue imdur - isosorbide mononitrate (IMDUR) 30 MG 24 hr tablet; TAKE 1 TABLET(30 MG) BY MOUTH DAILY  Dispense: 30 tablet; Refill: 0  3. Allergic rhinitis, unspecified seasonality, unspecified trigger - stable with singulair - montelukast (SINGULAIR) 10 MG tablet; TAKE 1 TABLET(10 MG) BY MOUTH AT BEDTIME  Dispense: 30 tablet; Refill: 0  4. Chronic obstructive pulmonary disease, unspecified COPD type ([place]) - breathing unlabored during exam - continue 3 liters oxygen at home - continue daily advair and prn albuterol - Fluticasone-Salmeterol (ADVAIR) 250-50 MCG/DOSE AEPB; Inhale 1 puff into the lungs 2 (two) times daily.  Dispense: 60 each; Refill: 0 - PROAIR HFA 108 (90 Base) MCG/ACT inhaler; INHALE 2 PUFF BY MOUTH EVERY 4 HOURS AS NEEDED USE ONLY IF YOU ARE WHEEZING  Dispense: 8.5 Roberts; Refill: 0  5. Lung mass - asymptomatic, remain on baseline oxygen use - daughter wanting non-aggressive approach at this time  6. Dementia with behavioral disturbance, unspecified dementia type (HCC) - stable, no recent behavioral outbursts reported - falls at  night attributed to diuretic and UTI not sundowning - DULoxetine (CYMBALTA) 60 MG capsule; Take 1 capsule (60 mg total) by mouth daily.  Dispense: 30 capsule; Refill: 0 - memantine (NAMENDA) 10 MG tablet; Take 1 tablet (10 mg total) by mouth daily.  Dispense: 30 tablet; Refill: 0  7. Chronic respiratory failure with hypoxia, on home O2 therapy (HCC) - stable with oxygen use - continue 3 liters at home  8. Paroxysmal atrial fibrillation (HCC) - rate controlled  - continue eliquis for dvt prophylaxis - apixaban (ELIQUIS) 2.5 MG  TABS tablet; Take 1 tablet (2.5 mg total) by mouth 2 (two) times daily.  Dispense: 60 tablet; Refill: 0  9. Morbid obesity ([place]) - BMI 47.43 - advised limiting calories to < 1500 calories/day  10. Controlled type 2 diabetes mellitus with stage 3 chronic kidney disease, with long-term current use of insulin (HCC) - blood sugars averaging 200-300 range - A1C- future - gabapentin (NEURONTIN) 100 MG capsule; Take 1 capsule (100 mg total) by mouth at bedtime. - insulin glargine (LANTUS) 100 UNIT/ML injection; Inject 0.18 mLs (18 Units total) into the skin daily.  Dispense: 10 mL; Refill: 0  11. Hypertensive heart disease with heart failure (HCC) - bp at goal < 150/90 - no symptoms of fluid overload - continue metoprolol and torsemide regimen - suggest low sodium diet < 2000 mg daily - cbc/diff- future -bmp- future - metoprolol succinate (TOPROL-XL) 50 MG 24 hr tablet; TAKE 1 TABLET BY MOUTH EVERY DAY. TAKE WITH OR IMMEDIATELY FOLLOWING A MEAL  Dispense: 30 tablet; Refill: 0 - torsemide (DEMADEX) 20 MG tablet; Take 1 tablet (20 mg total) by mouth daily.  Dispense: 30 tablet; Refill: 0  12. Acute cystitis without hematuria - she was having frequent urination at night  -culture positive for e.coli - nitrofurantoin (macrocrystal-monohydrate) (MACROBID) capsule 100 mg  13. Gastroesophageal reflux disease without esophagitis - stable with PPI, continue protonix -  pantoprazole (PROTONIX) 40 MG tablet; TAKE 1 TABLET(40 MG) BY MOUTH DAILY  Dispense: 30 tablet; Refill: 0  14. Coronary artery disease of native artery of native heart with stable angina pectoris ([place]) - stable with medication - amLODipine (NORVASC) 2.5 MG tablet; Take 1 tablet (2.5 mg total) by mouth daily.  Dispense: 30 tablet; Refill: 0 - atorvastatin (LIPITOR) 20 MG tablet; TAKE 1 TABLET(20 MG) BY MOUTH DAILY  Dispense: 30 tablet; Refill: 0 (MACROBID) capsule 100 mg   Patient is being discharged with the following home health services: PT/OT   Patient is being discharged with the following durable medical equipment: Wheelchair   Patient has been advised to f/u with their PCP in 1-2 weeks to bring them up to date on their rehab stay.  Social services at facility was responsible for arranging this appointment.  Pt was provided with a 30 day supply of prescriptions for medications and refills must be obtained from their PCP.  For controlled substances, a more limited supply may be provided adequate until PCP appointment only.   Family/ staff Communication: plan discussed with patient and facility nurse  Labs/tests ordered:  Cbc/diff, bmp, a1c

## 2020-07-17 DIAGNOSIS — M329 Systemic lupus erythematosus, unspecified: Secondary | ICD-10-CM | POA: Diagnosis not present

## 2020-07-17 DIAGNOSIS — I5033 Acute on chronic diastolic (congestive) heart failure: Secondary | ICD-10-CM | POA: Diagnosis not present

## 2020-07-17 DIAGNOSIS — I422 Other hypertrophic cardiomyopathy: Secondary | ICD-10-CM | POA: Diagnosis not present

## 2020-07-17 DIAGNOSIS — G9340 Encephalopathy, unspecified: Secondary | ICD-10-CM | POA: Diagnosis not present

## 2020-07-17 DIAGNOSIS — E039 Hypothyroidism, unspecified: Secondary | ICD-10-CM | POA: Diagnosis not present

## 2020-07-17 DIAGNOSIS — I480 Paroxysmal atrial fibrillation: Secondary | ICD-10-CM | POA: Diagnosis not present

## 2020-07-17 DIAGNOSIS — E1142 Type 2 diabetes mellitus with diabetic polyneuropathy: Secondary | ICD-10-CM | POA: Diagnosis not present

## 2020-07-17 DIAGNOSIS — M797 Fibromyalgia: Secondary | ICD-10-CM | POA: Diagnosis not present

## 2020-07-17 DIAGNOSIS — N3000 Acute cystitis without hematuria: Secondary | ICD-10-CM | POA: Diagnosis not present

## 2020-07-17 DIAGNOSIS — N184 Chronic kidney disease, stage 4 (severe): Secondary | ICD-10-CM | POA: Diagnosis not present

## 2020-07-17 DIAGNOSIS — I110 Hypertensive heart disease with heart failure: Secondary | ICD-10-CM | POA: Diagnosis not present

## 2020-07-17 DIAGNOSIS — J449 Chronic obstructive pulmonary disease, unspecified: Secondary | ICD-10-CM | POA: Diagnosis not present

## 2020-07-17 DIAGNOSIS — I700 Atherosclerosis of aorta: Secondary | ICD-10-CM | POA: Diagnosis not present

## 2020-07-17 DIAGNOSIS — J9621 Acute and chronic respiratory failure with hypoxia: Secondary | ICD-10-CM | POA: Diagnosis not present

## 2020-07-17 DIAGNOSIS — I25118 Atherosclerotic heart disease of native coronary artery with other forms of angina pectoris: Secondary | ICD-10-CM | POA: Diagnosis not present

## 2020-07-17 DIAGNOSIS — E1122 Type 2 diabetes mellitus with diabetic chronic kidney disease: Secondary | ICD-10-CM | POA: Diagnosis not present

## 2020-07-17 DIAGNOSIS — J309 Allergic rhinitis, unspecified: Secondary | ICD-10-CM | POA: Diagnosis not present

## 2020-07-18 ENCOUNTER — Telehealth: Payer: Self-pay

## 2020-07-18 ENCOUNTER — Telehealth: Payer: Self-pay | Admitting: Family Medicine

## 2020-07-18 DIAGNOSIS — M797 Fibromyalgia: Secondary | ICD-10-CM | POA: Diagnosis not present

## 2020-07-18 DIAGNOSIS — G9340 Encephalopathy, unspecified: Secondary | ICD-10-CM | POA: Diagnosis not present

## 2020-07-18 DIAGNOSIS — I422 Other hypertrophic cardiomyopathy: Secondary | ICD-10-CM | POA: Diagnosis not present

## 2020-07-18 DIAGNOSIS — N3000 Acute cystitis without hematuria: Secondary | ICD-10-CM | POA: Diagnosis not present

## 2020-07-18 DIAGNOSIS — R062 Wheezing: Secondary | ICD-10-CM | POA: Diagnosis not present

## 2020-07-18 DIAGNOSIS — J449 Chronic obstructive pulmonary disease, unspecified: Secondary | ICD-10-CM | POA: Diagnosis not present

## 2020-07-18 DIAGNOSIS — J309 Allergic rhinitis, unspecified: Secondary | ICD-10-CM | POA: Diagnosis not present

## 2020-07-18 DIAGNOSIS — I5033 Acute on chronic diastolic (congestive) heart failure: Secondary | ICD-10-CM | POA: Diagnosis not present

## 2020-07-18 DIAGNOSIS — J9621 Acute and chronic respiratory failure with hypoxia: Secondary | ICD-10-CM | POA: Diagnosis not present

## 2020-07-18 DIAGNOSIS — E039 Hypothyroidism, unspecified: Secondary | ICD-10-CM | POA: Diagnosis not present

## 2020-07-18 DIAGNOSIS — Z743 Need for continuous supervision: Secondary | ICD-10-CM | POA: Diagnosis not present

## 2020-07-18 DIAGNOSIS — I25118 Atherosclerotic heart disease of native coronary artery with other forms of angina pectoris: Secondary | ICD-10-CM | POA: Diagnosis not present

## 2020-07-18 DIAGNOSIS — E1142 Type 2 diabetes mellitus with diabetic polyneuropathy: Secondary | ICD-10-CM | POA: Diagnosis not present

## 2020-07-18 DIAGNOSIS — I700 Atherosclerosis of aorta: Secondary | ICD-10-CM | POA: Diagnosis not present

## 2020-07-18 DIAGNOSIS — N184 Chronic kidney disease, stage 4 (severe): Secondary | ICD-10-CM | POA: Diagnosis not present

## 2020-07-18 DIAGNOSIS — R0602 Shortness of breath: Secondary | ICD-10-CM | POA: Diagnosis not present

## 2020-07-18 DIAGNOSIS — I110 Hypertensive heart disease with heart failure: Secondary | ICD-10-CM | POA: Diagnosis not present

## 2020-07-18 DIAGNOSIS — I499 Cardiac arrhythmia, unspecified: Secondary | ICD-10-CM | POA: Diagnosis not present

## 2020-07-18 DIAGNOSIS — E1122 Type 2 diabetes mellitus with diabetic chronic kidney disease: Secondary | ICD-10-CM | POA: Diagnosis not present

## 2020-07-18 DIAGNOSIS — E1165 Type 2 diabetes mellitus with hyperglycemia: Secondary | ICD-10-CM | POA: Diagnosis not present

## 2020-07-18 DIAGNOSIS — I480 Paroxysmal atrial fibrillation: Secondary | ICD-10-CM | POA: Diagnosis not present

## 2020-07-18 DIAGNOSIS — M329 Systemic lupus erythematosus, unspecified: Secondary | ICD-10-CM | POA: Diagnosis not present

## 2020-07-18 NOTE — Telephone Encounter (Signed)
Gracie  called for [facility]  need verbal orders for home health  PT 1 time for 1 week 2 time for 4 week 1 time for 4 week  please call [phone]

## 2020-07-18 NOTE — Telephone Encounter (Signed)
Brittany Roberts from [facility] called in. Pt's oxygen is sitting around 81% with 4 liters of oxygen. It goes up to about 94%, but drops back down as soon as she is moving to the upper 70's/lower 80's. Spoke with Dr. [name] - she advised the ER if oxygen is running that low. Advised Brittany Roberts that the family can call the ambulance for patient to be evaluated before going to the ER.

## 2020-07-18 NOTE — Telephone Encounter (Signed)
Pt need verbal orders for OT as once a week for 9 weeks - [place] [facility] - [facility]

## 2020-07-22 DIAGNOSIS — J449 Chronic obstructive pulmonary disease, unspecified: Secondary | ICD-10-CM | POA: Diagnosis not present

## 2020-07-22 DIAGNOSIS — I110 Hypertensive heart disease with heart failure: Secondary | ICD-10-CM | POA: Diagnosis not present

## 2020-07-22 DIAGNOSIS — J9621 Acute and chronic respiratory failure with hypoxia: Secondary | ICD-10-CM | POA: Diagnosis not present

## 2020-07-22 DIAGNOSIS — E1142 Type 2 diabetes mellitus with diabetic polyneuropathy: Secondary | ICD-10-CM | POA: Diagnosis not present

## 2020-07-22 DIAGNOSIS — I5033 Acute on chronic diastolic (congestive) heart failure: Secondary | ICD-10-CM | POA: Diagnosis not present

## 2020-07-22 DIAGNOSIS — I480 Paroxysmal atrial fibrillation: Secondary | ICD-10-CM | POA: Diagnosis not present

## 2020-07-22 DIAGNOSIS — I25118 Atherosclerotic heart disease of native coronary artery with other forms of angina pectoris: Secondary | ICD-10-CM | POA: Diagnosis not present

## 2020-07-22 DIAGNOSIS — I700 Atherosclerosis of aorta: Secondary | ICD-10-CM | POA: Diagnosis not present

## 2020-07-22 DIAGNOSIS — N184 Chronic kidney disease, stage 4 (severe): Secondary | ICD-10-CM | POA: Diagnosis not present

## 2020-07-22 DIAGNOSIS — M329 Systemic lupus erythematosus, unspecified: Secondary | ICD-10-CM | POA: Diagnosis not present

## 2020-07-22 DIAGNOSIS — E1122 Type 2 diabetes mellitus with diabetic chronic kidney disease: Secondary | ICD-10-CM | POA: Diagnosis not present

## 2020-07-22 DIAGNOSIS — J309 Allergic rhinitis, unspecified: Secondary | ICD-10-CM | POA: Diagnosis not present

## 2020-07-22 DIAGNOSIS — M797 Fibromyalgia: Secondary | ICD-10-CM | POA: Diagnosis not present

## 2020-07-22 DIAGNOSIS — G9340 Encephalopathy, unspecified: Secondary | ICD-10-CM | POA: Diagnosis not present

## 2020-07-22 DIAGNOSIS — I422 Other hypertrophic cardiomyopathy: Secondary | ICD-10-CM | POA: Diagnosis not present

## 2020-07-22 DIAGNOSIS — E039 Hypothyroidism, unspecified: Secondary | ICD-10-CM | POA: Diagnosis not present

## 2020-07-22 DIAGNOSIS — N3000 Acute cystitis without hematuria: Secondary | ICD-10-CM | POA: Diagnosis not present

## 2020-07-22 NOTE — Telephone Encounter (Signed)
I left a voicemail for Brittany Roberts to call the office back.  I left a voicemail for [facility] letting her know the verbal order are approved.

## 2020-07-22 NOTE — Telephone Encounter (Signed)
Spoke with Terri Piedra, verbal given.

## 2020-07-22 NOTE — Telephone Encounter (Signed)
It is Ok to give verbal authorization to requested services. Thanks, BJ 

## 2020-07-23 ENCOUNTER — Other Ambulatory Visit: Payer: Self-pay | Admitting: Orthopedic Surgery

## 2020-07-23 DIAGNOSIS — J449 Chronic obstructive pulmonary disease, unspecified: Secondary | ICD-10-CM

## 2020-07-23 DIAGNOSIS — I480 Paroxysmal atrial fibrillation: Secondary | ICD-10-CM

## 2020-07-23 DIAGNOSIS — M329 Systemic lupus erythematosus, unspecified: Secondary | ICD-10-CM | POA: Diagnosis not present

## 2020-07-23 DIAGNOSIS — I110 Hypertensive heart disease with heart failure: Secondary | ICD-10-CM

## 2020-07-23 DIAGNOSIS — N184 Chronic kidney disease, stage 4 (severe): Secondary | ICD-10-CM | POA: Diagnosis not present

## 2020-07-23 DIAGNOSIS — M797 Fibromyalgia: Secondary | ICD-10-CM | POA: Diagnosis not present

## 2020-07-23 DIAGNOSIS — I5033 Acute on chronic diastolic (congestive) heart failure: Secondary | ICD-10-CM | POA: Diagnosis not present

## 2020-07-23 DIAGNOSIS — E1122 Type 2 diabetes mellitus with diabetic chronic kidney disease: Secondary | ICD-10-CM | POA: Diagnosis not present

## 2020-07-23 DIAGNOSIS — M109 Gout, unspecified: Secondary | ICD-10-CM

## 2020-07-23 DIAGNOSIS — K219 Gastro-esophageal reflux disease without esophagitis: Secondary | ICD-10-CM

## 2020-07-23 DIAGNOSIS — G9340 Encephalopathy, unspecified: Secondary | ICD-10-CM | POA: Diagnosis not present

## 2020-07-23 DIAGNOSIS — I700 Atherosclerosis of aorta: Secondary | ICD-10-CM | POA: Diagnosis not present

## 2020-07-23 DIAGNOSIS — J9621 Acute and chronic respiratory failure with hypoxia: Secondary | ICD-10-CM | POA: Diagnosis not present

## 2020-07-23 DIAGNOSIS — N183 Chronic kidney disease, stage 3 unspecified: Secondary | ICD-10-CM

## 2020-07-23 DIAGNOSIS — E1142 Type 2 diabetes mellitus with diabetic polyneuropathy: Secondary | ICD-10-CM | POA: Diagnosis not present

## 2020-07-23 DIAGNOSIS — N3000 Acute cystitis without hematuria: Secondary | ICD-10-CM | POA: Diagnosis not present

## 2020-07-23 DIAGNOSIS — I25118 Atherosclerotic heart disease of native coronary artery with other forms of angina pectoris: Secondary | ICD-10-CM | POA: Diagnosis not present

## 2020-07-23 DIAGNOSIS — R079 Chest pain, unspecified: Secondary | ICD-10-CM

## 2020-07-23 DIAGNOSIS — E039 Hypothyroidism, unspecified: Secondary | ICD-10-CM | POA: Diagnosis not present

## 2020-07-23 DIAGNOSIS — F0391 Unspecified dementia with behavioral disturbance: Secondary | ICD-10-CM

## 2020-07-23 DIAGNOSIS — J309 Allergic rhinitis, unspecified: Secondary | ICD-10-CM | POA: Diagnosis not present

## 2020-07-23 DIAGNOSIS — I422 Other hypertrophic cardiomyopathy: Secondary | ICD-10-CM | POA: Diagnosis not present

## 2020-07-23 MED ORDER — FLUTICASONE-SALMETEROL 250-50 MCG/DOSE IN AEPB: 1.0000 | INHALATION_SPRAY | Freq: Two times a day (BID) | RESPIRATORY_TRACT | 0 refills | Status: DC

## 2020-07-23 MED ORDER — PANTOPRAZOLE SODIUM 40 MG PO TBEC: DELAYED_RELEASE_TABLET | ORAL | 0 refills | Status: DC

## 2020-07-23 MED ORDER — AMLODIPINE BESYLATE 2.5 MG PO TABS
2.5000 mg | ORAL_TABLET | Freq: Every day | ORAL | 0 refills | Status: DC
Start: 2020-07-23 — End: 2020-09-04

## 2020-07-23 MED ORDER — ATORVASTATIN CALCIUM 20 MG PO TABS: ORAL_TABLET | ORAL | 0 refills | Status: DC

## 2020-07-23 MED ORDER — ISOSORBIDE MONONITRATE ER 30 MG PO TB24
ORAL_TABLET | ORAL | 0 refills | Status: DC
Start: 2020-07-23 — End: 2020-09-04

## 2020-07-23 MED ORDER — MONTELUKAST SODIUM 10 MG PO TABS
ORAL_TABLET | ORAL | 0 refills | Status: DC
Start: 2020-07-23 — End: 2020-09-04

## 2020-07-23 MED ORDER — APIXABAN 2.5 MG PO TABS: 2.5000 mg | ORAL_TABLET | Freq: Two times a day (BID) | ORAL | 0 refills | Status: DC

## 2020-07-23 MED ORDER — METOPROLOL SUCCINATE ER 50 MG PO TB24
ORAL_TABLET | ORAL | 0 refills | Status: DC
Start: 2020-07-23 — End: 2020-09-04

## 2020-07-23 MED ORDER — INSULIN ASPART 100 UNIT/ML ~~LOC~~ SOLN: 0.0000 [IU] | Freq: Three times a day (TID) | SUBCUTANEOUS | 1 refills | Status: DC

## 2020-07-23 MED ORDER — ALLOPURINOL 100 MG PO TABS: 200.0000 mg | ORAL_TABLET | Freq: Every day | ORAL | 0 refills | Status: DC

## 2020-07-23 MED ORDER — DULOXETINE HCL 60 MG PO CPEP: 60.0000 mg | ORAL_CAPSULE | Freq: Every day | ORAL | 0 refills | Status: DC

## 2020-07-23 MED ORDER — TORSEMIDE 20 MG PO TABS
20.0000 mg | ORAL_TABLET | Freq: Every day | ORAL | 0 refills | Status: DC
Start: 2020-07-23 — End: 2020-08-04

## 2020-07-23 MED ORDER — GABAPENTIN 100 MG PO CAPS: 100.0000 mg | ORAL_CAPSULE | Freq: Every day | ORAL | 0 refills | Status: DC

## 2020-07-23 MED ORDER — INSULIN GLARGINE 100 UNIT/ML ~~LOC~~ SOLN: 18.0000 [IU] | Freq: Every day | SUBCUTANEOUS | 0 refills | Status: DC

## 2020-07-23 MED ORDER — MEMANTINE HCL 10 MG PO TABS: 10.0000 mg | ORAL_TABLET | Freq: Every day | ORAL | 0 refills | Status: DC

## 2020-07-24 ENCOUNTER — Other Ambulatory Visit: Payer: Self-pay | Admitting: Family Medicine

## 2020-07-24 ENCOUNTER — Other Ambulatory Visit: Payer: Self-pay | Admitting: Orthopedic Surgery

## 2020-07-24 ENCOUNTER — Other Ambulatory Visit: Payer: Self-pay

## 2020-07-24 DIAGNOSIS — Z794 Long term (current) use of insulin: Secondary | ICD-10-CM

## 2020-07-24 DIAGNOSIS — E1122 Type 2 diabetes mellitus with diabetic chronic kidney disease: Secondary | ICD-10-CM

## 2020-07-24 MED ORDER — ACCU-CHEK SOFTCLIX LANCETS MISC: 0 refills | Status: DC

## 2020-07-24 MED ORDER — ACCU-CHEK GUIDE VI STRP: ORAL_STRIP | 0 refills | Status: DC

## 2020-07-24 MED ORDER — INSULIN PEN NEEDLE 31G X 5 MM MISC: 4.0000 | Freq: Every day | 0 refills | Status: DC | PRN

## 2020-07-25 ENCOUNTER — Other Ambulatory Visit: Payer: Self-pay | Admitting: Orthopedic Surgery

## 2020-07-25 DIAGNOSIS — E1142 Type 2 diabetes mellitus with diabetic polyneuropathy: Secondary | ICD-10-CM | POA: Diagnosis not present

## 2020-07-25 DIAGNOSIS — J309 Allergic rhinitis, unspecified: Secondary | ICD-10-CM | POA: Diagnosis not present

## 2020-07-25 DIAGNOSIS — G9340 Encephalopathy, unspecified: Secondary | ICD-10-CM | POA: Diagnosis not present

## 2020-07-25 DIAGNOSIS — I5033 Acute on chronic diastolic (congestive) heart failure: Secondary | ICD-10-CM | POA: Diagnosis not present

## 2020-07-25 DIAGNOSIS — K219 Gastro-esophageal reflux disease without esophagitis: Secondary | ICD-10-CM

## 2020-07-25 DIAGNOSIS — M109 Gout, unspecified: Secondary | ICD-10-CM

## 2020-07-25 DIAGNOSIS — M797 Fibromyalgia: Secondary | ICD-10-CM | POA: Diagnosis not present

## 2020-07-25 DIAGNOSIS — R079 Chest pain, unspecified: Secondary | ICD-10-CM

## 2020-07-25 DIAGNOSIS — I700 Atherosclerosis of aorta: Secondary | ICD-10-CM | POA: Diagnosis not present

## 2020-07-25 DIAGNOSIS — N184 Chronic kidney disease, stage 4 (severe): Secondary | ICD-10-CM | POA: Diagnosis not present

## 2020-07-25 DIAGNOSIS — I25118 Atherosclerotic heart disease of native coronary artery with other forms of angina pectoris: Secondary | ICD-10-CM | POA: Diagnosis not present

## 2020-07-25 DIAGNOSIS — N3000 Acute cystitis without hematuria: Secondary | ICD-10-CM | POA: Diagnosis not present

## 2020-07-25 DIAGNOSIS — I422 Other hypertrophic cardiomyopathy: Secondary | ICD-10-CM | POA: Diagnosis not present

## 2020-07-25 DIAGNOSIS — E1122 Type 2 diabetes mellitus with diabetic chronic kidney disease: Secondary | ICD-10-CM | POA: Diagnosis not present

## 2020-07-25 DIAGNOSIS — J9621 Acute and chronic respiratory failure with hypoxia: Secondary | ICD-10-CM | POA: Diagnosis not present

## 2020-07-25 DIAGNOSIS — I110 Hypertensive heart disease with heart failure: Secondary | ICD-10-CM | POA: Diagnosis not present

## 2020-07-25 DIAGNOSIS — E039 Hypothyroidism, unspecified: Secondary | ICD-10-CM | POA: Diagnosis not present

## 2020-07-25 DIAGNOSIS — M329 Systemic lupus erythematosus, unspecified: Secondary | ICD-10-CM | POA: Diagnosis not present

## 2020-07-25 DIAGNOSIS — I480 Paroxysmal atrial fibrillation: Secondary | ICD-10-CM | POA: Diagnosis not present

## 2020-07-25 DIAGNOSIS — J449 Chronic obstructive pulmonary disease, unspecified: Secondary | ICD-10-CM | POA: Diagnosis not present

## 2020-07-28 ENCOUNTER — Other Ambulatory Visit: Payer: Self-pay

## 2020-07-28 ENCOUNTER — Telehealth: Payer: Self-pay

## 2020-07-28 ENCOUNTER — Emergency Department (HOSPITAL_COMMUNITY)
Admission: EM | Admit: 2020-07-28 | Discharge: 2020-07-28 | Disposition: A | Discharge status: Home / Self Care | Payer: Medicare Other | Attending: Emergency Medicine | Admitting: Emergency Medicine

## 2020-07-28 ENCOUNTER — Encounter (HOSPITAL_COMMUNITY): Payer: Self-pay | Admitting: *Deleted

## 2020-07-28 ENCOUNTER — Other Ambulatory Visit: Payer: Self-pay | Admitting: Family Medicine

## 2020-07-28 ENCOUNTER — Emergency Department (HOSPITAL_COMMUNITY): Payer: Medicare Other

## 2020-07-28 DIAGNOSIS — E1122 Type 2 diabetes mellitus with diabetic chronic kidney disease: Secondary | ICD-10-CM | POA: Insufficient documentation

## 2020-07-28 DIAGNOSIS — I130 Hypertensive heart and chronic kidney disease with heart failure and stage 1 through stage 4 chronic kidney disease, or unspecified chronic kidney disease: Secondary | ICD-10-CM | POA: Insufficient documentation

## 2020-07-28 DIAGNOSIS — Z955 Presence of coronary angioplasty implant and graft: Secondary | ICD-10-CM | POA: Diagnosis not present

## 2020-07-28 DIAGNOSIS — Z794 Long term (current) use of insulin: Secondary | ICD-10-CM | POA: Insufficient documentation

## 2020-07-28 DIAGNOSIS — I491 Atrial premature depolarization: Secondary | ICD-10-CM | POA: Diagnosis not present

## 2020-07-28 DIAGNOSIS — Z7951 Long term (current) use of inhaled steroids: Secondary | ICD-10-CM | POA: Insufficient documentation

## 2020-07-28 DIAGNOSIS — F0390 Unspecified dementia without behavioral disturbance: Secondary | ICD-10-CM | POA: Insufficient documentation

## 2020-07-28 DIAGNOSIS — N184 Chronic kidney disease, stage 4 (severe): Secondary | ICD-10-CM | POA: Diagnosis not present

## 2020-07-28 DIAGNOSIS — E039 Hypothyroidism, unspecified: Secondary | ICD-10-CM | POA: Insufficient documentation

## 2020-07-28 DIAGNOSIS — Z79899 Other long term (current) drug therapy: Secondary | ICD-10-CM | POA: Insufficient documentation

## 2020-07-28 DIAGNOSIS — I110 Hypertensive heart disease with heart failure: Secondary | ICD-10-CM | POA: Diagnosis not present

## 2020-07-28 DIAGNOSIS — E1165 Type 2 diabetes mellitus with hyperglycemia: Secondary | ICD-10-CM | POA: Diagnosis not present

## 2020-07-28 DIAGNOSIS — I4891 Unspecified atrial fibrillation: Secondary | ICD-10-CM | POA: Insufficient documentation

## 2020-07-28 DIAGNOSIS — R0602 Shortness of breath: Secondary | ICD-10-CM | POA: Diagnosis not present

## 2020-07-28 DIAGNOSIS — Z7901 Long term (current) use of anticoagulants: Secondary | ICD-10-CM | POA: Diagnosis not present

## 2020-07-28 DIAGNOSIS — J45909 Unspecified asthma, uncomplicated: Secondary | ICD-10-CM | POA: Diagnosis not present

## 2020-07-28 DIAGNOSIS — Z20822 Contact with and (suspected) exposure to covid-19: Secondary | ICD-10-CM | POA: Diagnosis not present

## 2020-07-28 DIAGNOSIS — Z86010 Personal history of colonic polyps: Secondary | ICD-10-CM | POA: Diagnosis not present

## 2020-07-28 DIAGNOSIS — I517 Cardiomegaly: Secondary | ICD-10-CM | POA: Diagnosis not present

## 2020-07-28 DIAGNOSIS — I499 Cardiac arrhythmia, unspecified: Secondary | ICD-10-CM | POA: Diagnosis not present

## 2020-07-28 DIAGNOSIS — J449 Chronic obstructive pulmonary disease, unspecified: Secondary | ICD-10-CM | POA: Insufficient documentation

## 2020-07-28 DIAGNOSIS — Z8673 Personal history of transient ischemic attack (TIA), and cerebral infarction without residual deficits: Secondary | ICD-10-CM | POA: Diagnosis not present

## 2020-07-28 DIAGNOSIS — J90 Pleural effusion, not elsewhere classified: Secondary | ICD-10-CM | POA: Diagnosis not present

## 2020-07-28 DIAGNOSIS — Z8616 Personal history of COVID-19: Secondary | ICD-10-CM | POA: Insufficient documentation

## 2020-07-28 DIAGNOSIS — Z87891 Personal history of nicotine dependence: Secondary | ICD-10-CM | POA: Diagnosis not present

## 2020-07-28 DIAGNOSIS — I5023 Acute on chronic systolic (congestive) heart failure: Secondary | ICD-10-CM | POA: Insufficient documentation

## 2020-07-28 DIAGNOSIS — E1140 Type 2 diabetes mellitus with diabetic neuropathy, unspecified: Secondary | ICD-10-CM | POA: Insufficient documentation

## 2020-07-28 DIAGNOSIS — R6889 Other general symptoms and signs: Secondary | ICD-10-CM | POA: Diagnosis not present

## 2020-07-28 DIAGNOSIS — I509 Heart failure, unspecified: Secondary | ICD-10-CM

## 2020-07-28 DIAGNOSIS — I25119 Atherosclerotic heart disease of native coronary artery with unspecified angina pectoris: Secondary | ICD-10-CM | POA: Insufficient documentation

## 2020-07-28 DIAGNOSIS — Z743 Need for continuous supervision: Secondary | ICD-10-CM | POA: Diagnosis not present

## 2020-07-28 LAB — BASIC METABOLIC PANEL
Anion gap: 10 (ref 5–15)
BUN: 50 mg/dL — ABNORMAL HIGH (ref 8–23)
CO2: 28 mmol/L (ref 22–32)
Calcium: 9.4 mg/dL (ref 8.9–10.3)
Chloride: 100 mmol/L (ref 98–111)
Creatinine, Ser: 1.96 mg/dL — ABNORMAL HIGH (ref 0.44–1.00)
GFR, Estimated: 25 mL/min — ABNORMAL LOW (ref >60)
Glucose, Bld: 235 mg/dL — ABNORMAL HIGH (ref 70–99)
Potassium: 3.3 mmol/L — ABNORMAL LOW (ref 3.5–5.1)
Sodium: 138 mmol/L (ref 135–145)

## 2020-07-28 LAB — CBC
HCT: 30.1 % — ABNORMAL LOW (ref 36.0–46.0)
Hemoglobin: 10.2 g/dL — ABNORMAL LOW (ref 12.0–15.0)
MCH: 26.5 pg (ref 26.0–34.0)
MCHC: 33.9 g/dL (ref 30.0–36.0)
MCV: 78.2 fL — ABNORMAL LOW (ref 80.0–100.0)
Platelets: 176 10*3/uL (ref 150–400)
RBC: 3.85 MIL/uL — ABNORMAL LOW (ref 3.87–5.11)
RDW: 16.2 % — ABNORMAL HIGH (ref 11.5–15.5)
WBC: 12.1 10*3/uL — ABNORMAL HIGH (ref 4.0–10.5)
nRBC: 0 % (ref 0.0–0.2)

## 2020-07-28 LAB — BRAIN NATRIURETIC PEPTIDE: B Natriuretic Peptide: 1629.5 pg/mL — ABNORMAL HIGH (ref 0.0–100.0)

## 2020-07-28 LAB — PROTIME-INR
INR: 1.3 — ABNORMAL HIGH (ref 0.8–1.2)
Prothrombin Time: 15.6 seconds — ABNORMAL HIGH (ref 11.4–15.2)

## 2020-07-28 LAB — TROPONIN I (HIGH SENSITIVITY): Troponin I (High Sensitivity): 50 ng/L — ABNORMAL HIGH (ref <18)

## 2020-07-28 LAB — CBG MONITORING, ED: Glucose-Capillary: 159 mg/dL — ABNORMAL HIGH (ref 70–99)

## 2020-07-28 LAB — SARS CORONAVIRUS 2 BY RT PCR (HOSPITAL ORDER, PERFORMED IN ~~LOC~~ HOSPITAL LAB): SARS Coronavirus 2: NEGATIVE

## 2020-07-28 MED ORDER — METOPROLOL SUCCINATE ER 25 MG PO TB24
50.0000 mg | ORAL_TABLET | Freq: Every day | ORAL | Status: DC
  Administered 2020-07-28: 50 mg via ORAL
  Filled 2020-07-28: qty 2

## 2020-07-28 MED ORDER — METOPROLOL TARTRATE 5 MG/5ML IV SOLN
5.0000 mg | Freq: Once | INTRAVENOUS | Status: AC
  Administered 2020-07-28: 5 mg via INTRAVENOUS
  Filled 2020-07-28: qty 5

## 2020-07-28 MED ORDER — FUROSEMIDE 20 MG PO TABS
40.0000 mg | ORAL_TABLET | Freq: Once | ORAL | Status: AC
  Administered 2020-07-28: 40 mg via ORAL
  Filled 2020-07-28: qty 2

## 2020-07-28 MED ORDER — FUROSEMIDE 40 MG PO TABS: 40.0000 mg | ORAL_TABLET | Freq: Every day | ORAL | 0 refills | Status: DC

## 2020-07-28 NOTE — Telephone Encounter (Signed)
No answer wasn't able to leave vm will try again later

## 2020-07-28 NOTE — ED Provider Notes (Signed)
[facility] EMERGENCY DEPARTMENT Provider Note   CSN: [id number] Arrival date & time: 07/28/20  0244     History Chief Complaint  Patient presents with  . Shortness of Breath    Brittany Roberts is a 80 y.o. female.  HPI  Brittany Roberts has a past medical history significant for coronary artery disease status post PCI, morbid obesity, chronic diastolic heart failure, chronic hypoxic respiratory failure on 3 L of home oxygen, obstructive sleep apnea not on CPAP, hypertension, hyperlipidemia, hypothyroidism, bipolar disorder, depression, fibromyalgia, stroke, gastroesophageal reflux disease, gout, sickle cell trait, lupus, type 2 diabetes, peripheral neuropathy, seizure disorder, chronic obstructive pulmonary disease, chronic kidney disease stage IIIb, and a history of left ventricle apical thrombus treated with 3 months of anticoagulation and Covid infection in March 2021.  Patient was hospitalized 12/19 to 12/24 for acute encephalopathy with CHF exacerbation community-acquired pneumonia and other comorbid conditions.  Patient presents today stating that she is getting short of breath with even very minor activities.  She is using her baseline oxygen at 3 L but, her daughter reports due to the length of the tubing they are turning up to 5 L at times.  If she is at rest, her oxygen level stays normal but even talking can cause it to drop some.  She is not experiencing chest pain.  She has not developed a fever.  She will chronically have intermittent cough.  Not worse or increased from baseline.  Reports she is compliant with her torsemide.  Her daughter notes a little bit of increased swelling in the legs.  There was an imprint on her legs when she removes her socks today.    Past Medical History:  Diagnosis Date  . Acute delirium 01/14/2017  . Anxiety   . Arthritis    "all over"  . Asthma   . Bipolar disorder ([place])   . Complication of anesthesia    "w/right foot OR they gave me  too much and they couldn't get me woke"  . Congestive heart failure, unspecified   . Coronary artery calcification seen on CAT scan 01/14/2017  . Coronary artery disease    "I've got 1 stent" (06/30/2015)  . Depression   . Discoid lupus   . Fibromyalgia    "I've been told I have this; can't take Lyrica cause I'm allergic to it" (06/30/2015)  . GERD (gastroesophageal reflux disease)   . History of gout   . History of hiatal hernia    "real bad" (06/30/2015)  . Hypertension   . Hypothyroidism   . Memory loss   . On home oxygen therapy    "2L q hs" (06/30/2015)  . OSA (obstructive sleep apnea)    "just use my oxygen; no mask" (06/30/2015)  . Other and unspecified hyperlipidemia   . Penetrating foot wound    left nonhealing foot wound on the dorsal surface  . Peripheral neuropathy   . Pneumonia "many of times"  . Refusal of blood transfusions as patient is Jehovah's Witness   . Renal failure, unspecified    "kidneys not working 100%" (06/30/2015)  . Seizure ([place])   . Sickle cell trait ([place])   . Systemic lupus ([place])   . Thoracic aortic atherosclerosis ([place]) 01/14/2017  . Type II diabetes mellitus ([place])   . Unspecified vitamin D deficiency     Patient Active Problem List   Diagnosis Date Noted  . A-fib ([place]) 06/27/2020  . Dementia ([place]) 06/27/2020  . Acute encephalopathy 06/23/2020  .  CAP (community acquired pneumonia) 06/23/2020  . Lung mass 06/23/2020  . Atrial fibrillation with RVR ([place]) 06/10/2020  . AMS (altered mental status) 06/07/2020  . Hypomagnesemia 02/20/2020  . Chronic respiratory failure with hypoxia, on home O2 therapy ([place]) 02/20/2020  . Monitoring for long-term anticoagulant use 12/17/2019  . LV (left ventricular) mural thrombus 12/11/2019  . Acute on chronic diastolic CHF (congestive heart failure) ([place]) 12/06/2019  . Pneumonia due to COVID-19 virus 11/01/2019  . Respiratory failure ([place]) 11/01/2019  . Memory loss 04/05/2019  . Cerebrovascular accident  (CVA) ([place]) 04/05/2019  . Seizures ([place]) 04/05/2019  . Obstructive sleep apnea 04/05/2019  . Chronic heart failure with preserved ejection fraction ([place]) 01/17/2019  . OP (osteoporosis) 01/01/2019  . Abdominal wall pain in right flank 08/13/2018  . Alopecia, scarring 08/11/2018  . Chronic right shoulder pain 03/15/2018  . Bilateral primary osteoarthritis of knee 02/09/2018  . Pain in right ankle and joints of right foot 02/09/2018  . Acute exacerbation of congestive heart failure ([place]) 12/13/2017  . Chronic kidney disease (CKD), stage IV (severe) ([place]) 09/01/2017  . Gout, arthropathy 09/01/2017  . Impacted cerumen of both ears 05/13/2017  . Sensorineural hearing loss, bilateral 05/13/2017  . Localization-related symptomatic epilepsy and epileptic syndromes with complex partial seizures, not intractable, without status epilepticus ([place]) 04/25/2017  . Fibromyalgia 04/08/2017  . Allergic rhinitis 03/11/2017  . Cardiomyopathy ([place]) 01/22/2017  . Hyperlipidemia associated with type 2 diabetes mellitus ([place]) 01/22/2017  . Bipolar disorder ([place]) 01/22/2017  . Seizure ([place])   . Thoracic aortic atherosclerosis ([place]) 01/14/2017  . Coronary artery calcification seen on CAT scan 01/14/2017  . Acute delirium 01/14/2017  . Possible Tonic-clonic seizure disorder ([place]) 01/14/2017  . CVA (cerebral vascular accident) ([place]) 01/14/2017  . Altered mental status 01/13/2017  . Mastalgia 04/20/2016  . Bilateral leg edema 02/10/2016  . Difficulty hearing 10/30/2015  . Midline low back pain with right-sided sciatica 09/25/2015  . Carpal tunnel syndrome on left 08/14/2015  . Chest pain of uncertain etiology 28/31/5176  . Primary localized osteoarthrosis, hand 06/19/2015  . Radial styloid tenosynovitis 06/19/2015  . Dysarthria 02/14/2015  . Type 2 diabetes mellitus with complication ([place])   . Cephalalgia   . Hx of systemic lupus erythematosus (SLE) ([place])   . Thyroid mass 04/26/2014  . Constipation  12/10/2013  . Benign neoplasm of colon 12/10/2013  . Type 2 diabetes mellitus with diabetic neuropathy, unspecified ([place]) 11/27/2013  . Renal cyst, right 11/09/2013  . Insulin dependent type 2 diabetes mellitus ([place]) 10/29/2013  . Hoarseness or changing voice 09/27/2013  . Dyskinesia, tardive 07/11/2013  . TMJ syndrome 02/16/2013  . Angina pectoris associated with type 2 diabetes mellitus ([place]) 08/08/2012  . Coronary artery disease of native artery of native heart with stable angina pectoris ([place]) 08/08/2012  . Asthma, chronic, PRESUMED wiht MULTIFACTORIAL DYSPNEA 05/30/2012  . Insomnia 09/22/2011  . Mobility poor 02/02/2011  . Chronic kidney disease, stage III (moderate) ([place]) 05/13/2010  . Chronic systolic (congestive) heart failure ([place]) 03/31/2010  . COPD (chronic obstructive pulmonary disease) ([place]) 03/26/2010  . Morbid obesity ([place]) 02/27/2010  . Unstable gait 02/27/2010  . DM (diabetes mellitus), type 2 with neurological complications ([place]) 16/01/3709  . Arthritis 04/25/2009  . Anemia 03/11/2007  . COGNITIVE IMPAIRMENT, MILD, SO STATED 03/11/2007  . RESTLESS LEG SYNDROME 03/11/2007  . Lupus ([place]) 03/11/2007  . OSTEOARTHROSIS, GENERALIZED, MULTIPLE SITES 03/11/2007  . Hypothyroidism 03/07/2007  . Hypercholesteremia 03/07/2007  . Depression 03/07/2007  . Hypertensive heart disease with heart  failure ([place]) 03/07/2007  . Coronary atherosclerosis 03/07/2007  . GERD 03/07/2007  . SLEEP APNEA 03/07/2007    Past Surgical History:  Procedure Laterality Date  . ANKLE ARTHROSCOPY WITH OPEN REDUCTION INTERNAL FIXATION (ORIF) Right 03/2011  . CARDIAC CATHETERIZATION N/A 07/01/2015   Procedure: Left Heart Cath and Coronary Angiography;  Surgeon: Adrian Prows, MD;  Location: [facility];  Service: Cardiovascular;  Laterality: N/A;  . CARDIAC CATHETERIZATION N/A 07/01/2015   Procedure: Intravascular Pressure Wire/FFR Study;  Surgeon: Adrian Prows, MD;  Location: [facility];   Service: Cardiovascular;  Laterality: N/A;  . CARPAL TUNNEL RELEASE Bilateral   . CATARACT EXTRACTION W/ INTRAOCULAR LENS  IMPLANT, BILATERAL Bilateral   . CHOLECYSTECTOMY OPEN    . COLONOSCOPY N/A 12/10/2013   Procedure: COLONOSCOPY;  Surgeon: Inda Castle, MD;  Location: [facility];  Service: Endoscopy;  Laterality: N/A;  . FRACTURE SURGERY    . INCISION AND DRAINAGE ABSCESS Left    foot "under my little toe"  . KNEE LIGAMENT RECONSTRUCTION Right   . LEFT HEART CATHETERIZATION WITH CORONARY ANGIOGRAM N/A 08/08/2012   Procedure: LEFT HEART CATHETERIZATION WITH CORONARY ANGIOGRAM;  Surgeon: [name], MD;  Location[facility] CATH LAB;  Service: Cardiovascular;  Laterality: N/A;  . NASAL SINUS SURGERY    . PERCUTANEOUS CORONARY STENT INTERVENTION (PCI-S) N/A 08/21/2012   Procedure: PERCUTANEOUS CORONARY STENT INTERVENTION (PCI-S);  Surgeon: [name], MD;  Location: [facility] CATH LAB;  Service: Cardiovascular;  Laterality: N/A;  . TUBAL LIGATION       OB History   No obstetric history on file.     Family History  Problem Relation Age of Onset  . Heart disease Mother   . Diabetes Mother   . Clotting disorder Mother   . Pneumonia Father   . Rheum arthritis Father   . Diabetes Sister   . Diabetes Sister   . Asthma Brother   . Cancer Brother   . Kidney disease Brother   . Lupus Son   . Heart disease Son   . Heart disease Daughter   . Colon cancer Neg Hx     Social History   Tobacco Use  . Smoking status: Former Smoker    Packs/day: 3.00    Years: 40.00    Pack years: 120.00    Types: Cigarettes    Start date: 07/05/1960    Quit date: 07/05/1998    Years since quitting: 22.0  . Smokeless tobacco: Never Used  Vaping Use  . Vaping Use: Never used  Substance Use Topics  . Alcohol use: No    Alcohol/week: 0.0 standard drinks    Comment: havent drank in over 40 years  . Drug use: No    Home Medications Prior to Admission medications   Medication Sig Start Date End  Date Taking? Authorizing Provider  furosemide (LASIX) 40 MG tablet Take 1 tablet (40 mg total) by mouth daily for 3 days. 07/28/20 07/31/20 Yes Charlesetta Shanks, MD  Accu-Chek Softclix Lancets lancets Use as instructed 07/24/20   Windell Moulding E, NP  acetaminophen (TYLENOL) 500 MG tablet Take 500 mg by mouth every 6 (six) hours as needed for moderate pain.    [provider]  allopurinol (ZYLOPRIM) 100 MG tablet Take 2 tablets (200 mg total) by mouth daily. 07/23/20   Fargo, Amy E, NP  amLODipine (NORVASC) 2.5 MG tablet Take 1 tablet (2.5 mg total) by mouth daily. 07/23/20   Yvonna Alanis, NP  apixaban Arne [place])  2.5 MG TABS tablet Take 1 tablet (2.5 mg total) by mouth 2 (two) times daily. 07/23/20   Fargo, Amy E, NP  Ascorbic Acid (VITAMIN C) 500 MG CAPS Take 1 capsule by mouth daily. 02/26/20   [name], [name], MD  atorvastatin (LIPITOR) 20 MG tablet TAKE 1 TABLET(20 MG) BY MOUTH DAILY 07/23/20   Fargo, Amy E, NP  B Complex Vitamins (VITAMIN B COMPLEX) TABS Take 1 tablet by mouth daily.    [provider]  Cholecalciferol (VITAMIN D3) 125 MCG (5000 UT) TABS Take 5,000 Units by mouth daily.    [provider]  DULoxetine (CYMBALTA) 60 MG capsule Take 1 capsule (60 mg total) by mouth daily. 07/23/20   Fargo, Amy E, NP  feeding supplement (ENSURE ENLIVE / ENSURE PLUS) LIQD Take 237 mLs by mouth 4 (four) times daily. 06/27/20   Debbe Odea, MD  Fluocinolone Acetonide Scalp 0.01 % OIL Apply 1 application topically at bedtime as needed (itching scalp).  07/30/19   [provider]  Fluticasone-Salmeterol (ADVAIR) 250-50 MCG/DOSE AEPB Inhale 1 puff into the lungs 2 (two) times daily. 07/23/20   Fargo, Amy E, NP  gabapentin (NEURONTIN) 100 MG capsule Take 1 capsule (100 mg total) by mouth at bedtime. 07/23/20   Fargo, Amy E, NP  glucose blood (ACCU-CHEK GUIDE) test strip Use to test blood sugars 1-2 times daily. Dx: e11.9 07/24/20   Fargo, Amy E, NP  insulin aspart (NOVOLOG) 100 UNIT/ML  injection Inject 0-9 Units into the skin 3 (three) times daily with meals. CBG 70 - 120: 0 units  CBG 121 - 150: 1 unit  CBG 151 - 200: 2 units  CBG 201 - 250: 3 units  CBG 251 - 300: 5 units  CBG 301 - 350: 7 units  CBG 351 - 400 9 units 07/23/20   Fargo, Amy E, NP  insulin glargine (LANTUS) 100 UNIT/ML injection Inject 0.18 mLs (18 Units total) into the skin daily. 07/23/20   Fargo, Amy E, NP  Insulin Pen Needle 31G X 5 MM MISC 4 each by Does not apply route daily as needed (used to administer insulin AC & HS). 07/24/20   Fargo, Amy E, NP  isosorbide mononitrate (IMDUR) 30 MG 24 hr tablet TAKE 1 TABLET(30 MG) BY MOUTH DAILY 07/23/20   Fargo, Amy E, NP  melatonin 5 MG TABS Take 5 mg by mouth.    [provider]  memantine (NAMENDA) 10 MG tablet Take 1 tablet (10 mg total) by mouth daily. 07/23/20   Fargo, Amy E, NP  metoprolol succinate (TOPROL-XL) 50 MG 24 hr tablet TAKE 1 TABLET BY MOUTH EVERY DAY. TAKE WITH OR IMMEDIATELY FOLLOWING A MEAL 07/23/20   Fargo, Amy E, NP  montelukast (SINGULAIR) 10 MG tablet TAKE 1 TABLET(10 MG) BY MOUTH AT BEDTIME 07/23/20   Fargo, Amy E, NP  Omega-3 Fatty Acids (FISH OIL) 1000 MG CAPS Take 1,000 mg by mouth daily.     [provider]  pantoprazole (PROTONIX) 40 MG tablet TAKE 1 TABLET(40 MG) BY MOUTH DAILY 07/23/20   Fargo, Amy E, NP  Polyethyl Glycol-Propyl Glycol (SYSTANE) 0.4-0.3 % GEL ophthalmic gel Place 1 application into both eyes every 4 (four) hours as needed (dry eyes).    [provider]  PROAIR HFA 108 (90 Base) MCG/ACT inhaler INHALE 2 PUFF BY MOUTH EVERY 4 HOURS AS NEEDED USE ONLY IF YOU ARE WHEEZING 07/16/20   Fargo, Amy E, NP  torsemide (DEMADEX) 20 MG tablet Take 1  tablet (20 mg total) by mouth daily. 07/23/20   Yvonna Alanis, NP    Allergies    Ace inhibitors, Penicillins, Buspirone, Isosorb dinitrate-hydralazine, Pregabalin, Ropinirole hydrochloride, and Amantadines  Review of Systems   Review of Systems 10 systems  reviewed and negative except as per HPI Physical Exam Updated Vital Signs BP (!) 155/134 (BP Location: Left Arm)   Pulse 64   Temp 98.4 F (36.9 C) (Oral)   Resp (!) 22   SpO2 100%   Physical Exam Constitutional:      Comments: EDPatient is alert and nontoxic.  Pleasant without respiratory distress at rest.  Physically deconditioned.  HENT:     Head: Normocephalic and atraumatic.     Mouth/Throat:     Pharynx: Oropharynx is clear.  Eyes:     Extraocular Movements: Extraocular movements intact.  Cardiovascular:     Rate and Rhythm: Normal rate and regular rhythm.  Pulmonary:     Effort: Pulmonary effort is normal.     Breath sounds: Normal breath sounds.     Comments: Muscles are soft.  No gross crackle or wheeze. Abdominal:     General: There is no distension.     Palpations: Abdomen is soft.     Tenderness: There is no abdominal tenderness. There is no guarding.  Musculoskeletal:        General: No swelling, tenderness or signs of injury. Normal range of motion.     Right lower leg: No edema.     Left lower leg: No edema.     Comments: Patient does have obesity of the legs however the calves and the ankles and the feet are without any edema.  Condition of the lower legs is good.  Skin:    General: Skin is warm and dry.  Neurological:     General: No focal deficit present.     Comments: Patient is physically deconditioned but is alert and pleasantly interactive.  Speech is clear.  She can assist to reposition somewhat in the stretcher.  Psychiatric:        Mood and Affect: Mood normal.     ED Results / Procedures / Treatments   Labs (all labs ordered are listed, but only abnormal results are displayed) Labs Reviewed  BASIC METABOLIC PANEL - Abnormal; Notable for the following components:      Result Value   Potassium 3.3 (*)    Glucose, Bld 235 (*)    BUN 50 (*)    Creatinine, Ser 1.96 (*)    GFR, Estimated 25 (*)    All other components within normal limits   CBC - Abnormal; Notable for the following components:   WBC 12.1 (*)    RBC 3.85 (*)    Hemoglobin 10.2 (*)    HCT 30.1 (*)    MCV 78.2 (*)    RDW 16.2 (*)    All other components within normal limits  PROTIME-INR - Abnormal; Notable for the following components:   Prothrombin Time 15.6 (*)    INR 1.3 (*)    All other components within normal limits  BRAIN NATRIURETIC PEPTIDE - Abnormal; Notable for the following components:   B Natriuretic Peptide 1,629.5 (*)    All other components within normal limits  CBG MONITORING, ED - Abnormal; Notable for the following components:   Glucose-Capillary 159 (*)    All other components within normal limits  SARS CORONAVIRUS 2 BY RT PCR (HOSPITAL ORDER, [facility])  TROPONIN I (  HIGH SENSITIVITY)    EKG EKG Interpretation  Date/Time:  Monday July 28 2020 03:08:05 EST Ventricular Rate:  119 PR Interval:    QRS Duration: 86 QT Interval:  278 QTC Calculation: 391 R Axis:   -49 Text Interpretation: Atrial fibrillation with rapid ventricular response with premature ventricular or aberrantly conducted complexes Left axis deviation Low voltage QRS Cannot rule out Anterior infarct , age undetermined Abnormal ECG since last tracing no significant change Confirmed by [name] [phone]) on 07/28/2020 9:20:22 AM   Radiology DG Chest 2 View  Result Date: 07/28/2020 CLINICAL DATA:  Swelling and shortness of breath EXAM: CHEST - 2 VIEW COMPARISON:  06/23/2020 FINDINGS: Cardiomegaly that is chronic. Rightward tracheal deviation from thyroid enlargement by comparison chest CT. Vascular congestion, diffuse interstitial opacity, fissure thickening, and small pleural effusions. IMPRESSION: CHF pattern. Electronically Signed   By: Monte Fantasia M.D.   On: 07/28/2020 04:14    Procedures Procedures   Medications Ordered in ED Medications  furosemide (LASIX) tablet 40 mg (has no administration in time range)    ED  Course  I have reviewed the triage vital signs and the nursing notes.  Pertinent labs & imaging results that were available during my care of the patient were reviewed by me and considered in my medical decision making (see chart for details).    MDM Rules/Calculators/A&P                         Patient presents with some increasing dyspnea on exertion.  She has chronic hypoxic respiratory failure on home oxygen.  Patient has not had fever or productive cough.  Chest x-ray shows some mild increased vascular congestion and BNP elevated consistent with previous presentation.  Covid testing negative. At this time, patient does have alert mental status and is oxygenating at 100% on 6 L.  She has been using 5 L at home.  Will add 3 days of Lasix to the patient's medication regimen.  Prior to discharge note indicates they are working with hospice for increasing home care for chronic respiratory failure, patient's daughter encouraged to call and continue that conversation.  Close follow-up with PCP within the next 3 to 4 days recommended.  Final Clinical Impression(s) / ED Diagnoses Final diagnoses:  Acute on chronic congestive heart failure, unspecified heart failure type ([place])    Rx / DC Orders ED Discharge Orders         Ordered    furosemide (LASIX) 40 MG tablet  Daily        07/28/20 1506           Charlesetta Shanks, MD 07/28/20 1513

## 2020-07-28 NOTE — Telephone Encounter (Signed)
I reviewed the chart. I agree with the ED physicians management so far. If she gets admitted to the hospital, I will be happy to see her there. If hospital admission is not warranted, it is better to follow up outpatient. I will be be happy to see her at the next available appt.   Thanks MJP

## 2020-07-28 NOTE — Discharge Instructions (Signed)
1.  At this time you have some increase in your congestive heart failure.  Plan will be to continue your home oxygen as needed.  You will be prescribed an additional 3 days of Lasix to take to try to bring additional fluid off. 2.  You need to schedule a follow-up with your doctor within 2 to 3 days for close follow-up, with monitoring of your labs and your oxygen readings. 3.  Continue your regularly prescribed medications.

## 2020-07-28 NOTE — ED Triage Notes (Signed)
Pt states she has not been feeling well for about 3 weeks. Having a hard time sleeping d/t shortness of breath. Family with pt says that she has been coughing "and getting strangled" when she coughs. Denies fevers. Continued on 6 liters O2

## 2020-07-28 NOTE — ED Triage Notes (Signed)
Pt arrives via EMS. Per EMS report, C/o SOB and Gained 4lbs of fluid in the stomach. BP 150/110 to 2 nitro then it was 107/70. Hr 74. rr 18. 4 liters chronic, 80-84%, increased to 6 liters.  cbg 327, hx of diabetes. 20 g in the right forearm. AFIB on the monitor, frequent PVC's. Improved PVC's with oxygenation.

## 2020-07-28 NOTE — Telephone Encounter (Signed)
Patient's daughter called that her mother, patient had to go to the ED yesterday because patient was complaining about sob and patient has fluid daughter states they're release patient but her heart rate is uncontrolled daughter believes patient is in afib and she still has a lot of fluid daughter doesn't want patient to be release due to her not knowing what to do if her mother gets worse daughter would like to speak to you please advise

## 2020-07-29 ENCOUNTER — Other Ambulatory Visit: Payer: Self-pay

## 2020-07-30 ENCOUNTER — Ambulatory Visit (INDEPENDENT_AMBULATORY_CARE_PROVIDER_SITE_OTHER): Payer: Medicare Other | Admitting: Family Medicine

## 2020-07-30 ENCOUNTER — Encounter: Payer: Self-pay | Admitting: Family Medicine

## 2020-07-30 VITALS — BP 136/80 | HR 66 | Resp 16 | Ht 63.5 in

## 2020-07-30 DIAGNOSIS — Z794 Long term (current) use of insulin: Secondary | ICD-10-CM | POA: Diagnosis not present

## 2020-07-30 DIAGNOSIS — F419 Anxiety disorder, unspecified: Secondary | ICD-10-CM

## 2020-07-30 DIAGNOSIS — E876 Hypokalemia: Secondary | ICD-10-CM

## 2020-07-30 DIAGNOSIS — Z9981 Dependence on supplemental oxygen: Secondary | ICD-10-CM | POA: Diagnosis not present

## 2020-07-30 DIAGNOSIS — N184 Chronic kidney disease, stage 4 (severe): Secondary | ICD-10-CM

## 2020-07-30 DIAGNOSIS — J9611 Chronic respiratory failure with hypoxia: Secondary | ICD-10-CM | POA: Diagnosis not present

## 2020-07-30 DIAGNOSIS — I700 Atherosclerosis of aorta: Secondary | ICD-10-CM

## 2020-07-30 DIAGNOSIS — I110 Hypertensive heart disease with heart failure: Secondary | ICD-10-CM

## 2020-07-30 DIAGNOSIS — Z7189 Other specified counseling: Secondary | ICD-10-CM | POA: Diagnosis not present

## 2020-07-30 DIAGNOSIS — I5032 Chronic diastolic (congestive) heart failure: Secondary | ICD-10-CM

## 2020-07-30 DIAGNOSIS — E1140 Type 2 diabetes mellitus with diabetic neuropathy, unspecified: Secondary | ICD-10-CM

## 2020-07-30 MED ORDER — INSULIN GLARGINE 100 UNITS/ML SOLOSTAR PEN: 10.0000 [IU] | PEN_INJECTOR | Freq: Every day | SUBCUTANEOUS | 2 refills | Status: DC

## 2020-07-30 MED ORDER — LORAZEPAM 0.5 MG PO TABS: 0.5000 mg | ORAL_TABLET | Freq: Two times a day (BID) | ORAL | 0 refills | Status: DC | PRN

## 2020-07-30 NOTE — Patient Instructions (Addendum)
A few things to remember from today's visit:  If you need refills please call your pharmacy. Do not use My Chart to request refills or for acute issues that need immediate attention.  Ativan started today for acute anxiety.  Hospice Hospice is a service that provides people who are terminally ill and their families with medical, spiritual, and psychological support. It aims to improve a person's quality of life by keeping the person as comfortable as possible in the final stages of life. Who makes up the hospice care team? The following people make up a hospice care team:  The person receiving care and his or her family.  A nurse and a hospice doctor. Your primary health care provider can also be included.  A Education officer, museum or Tourist information centre manager.  A religious or spiritual leader.  Specialists such as: ? A dietitian. ? A mental health counselor. ? Physical and occupational therapists. ? Bereavement coordinator.  Trained volunteers who can help with care.   What services are included in hospice care? Hospice services can vary, depending on the organization. Generally, they include:  Ways to keep you comfortable, such as: ? Providing care in your home or in a home-like setting. ? Providing relief from pain and symptoms. The staff will supply all medicines and equipment to keep you comfortable and alert enough to enjoy the company of friends and family. ? Working with your loved ones to help them meet your needs and provide much of your basic care. This helps you to enjoy their support.  Visits or care from a nurse and doctor. This may include 24-hour on-call services.  Visits from other specialists who offer services, such as: ? Counseling to meet your emotional, spiritual, and social needs as well as those needs of your family. ? Massage therapy. ? Nutrition therapy. ? Physical and occupational therapy. ? Art or music therapy. ? Spiritual care to meet your needs and your family's  needs. It may involve:  Helping you and your family understand the dying process.  Helping you say goodbye to your family and friends.  Performing a specific religious ceremony or ritual.  Companionship when you are alone.  Allowing you and your family to rest. Hospice staff may do light housekeeping, prepare meals, and run errands.  Short-term inpatient care, if something cannot be managed in the home.  Bereavement support for grieving family members. When should hospice care begin? Most people who use hospice are believed to have 6 months or less to live.  Your family and health care providers can help you decide when hospice services should begin.  If you live longer than 6 months but your condition does not improve, your doctor may be able to approve you for continued hospice care.  If your condition improves, you may discontinue the program. What should I consider before selecting a program? Most hospice programs are run by nonprofit, independent organizations. Some are affiliated with hospitals, nursing homes, or home health care agencies. Hospice programs can take place in your home or at a hospice center, hospital, or skilled nursing facility. When choosing a hospice program, ask about the following:  What services are available to me and my loved ones? ? What does it cost? Is it covered by insurance? ? Is the program reviewed and licensed by the state or certified in some other way? ? How will my pain and symptoms be managed? ? Will you provide emotional and spiritual support?  If I choose a hospice center or nursing  home, where is the hospice center located? Is it convenient for family and friends? ? If I choose a hospice center or nursing home, will my family and friends be able to visit any time? ? If my circumstances change, can the services be provided in a different setting, such as in my home or in the hospital?  Who makes up the hospice care team? How are they  trained or screened? ? How involved can my loved ones be? ? Whom can my family call with questions? ? How is my health care provider involved? Where can I learn more about hospice? You can learn about existing hospice programs in your area from your health care providers. The websites of the following organizations have helpful information:  [facility] and Palliative Care Organization: http://www.brown-buchanan.com/  [employer]: http://massey-hart.com/  [employer] [place]: www.hospicefoundation.org  American Cancer Society: www.cancer.org  eHospice: ehospice.com These agencies also can provide information:  A local agency on aging.  Your local [employer] chapter.  Your state's department of health or social services. Summary  Hospice is a service that provides people who are terminally ill and their families with medical, spiritual, and psychological support.  Hospice aims to improve your quality of life by keeping you as comfortable as possible in the final stages of life.  Hospice teams often include a doctor, nurse, social worker, counselor, religious or spiritual leader, dietitian, therapists, and volunteers.  Hospice care generally includes pain management, visits from doctors and nurses, physical and occupational therapy, nutrition therapy, spiritual and emotional counseling, caregiver support, and bereavement support for grieving family members.  Hospice programs can take place in your home or at a hospice center, hospital, or skilled nursing facility. This information is not intended to replace advice given to you by your health care provider. Make sure you discuss any questions you have with your health care provider. Document Revised: 03/25/2020 Document Reviewed: 03/25/2020 Elsevier Patient Education  2021 [employer].  Please be sure medication list is accurate. If a new problem present, please set up appointment sooner than planned  today.

## 2020-07-30 NOTE — Progress Notes (Signed)
HPI:  [name] is a 80 y.o. female with hx of COPD,CHF,atrial fib,OSA, CKD,hypothyroidism,fibromyalgia,and DM II is here today with her daughter to follow on recent ER visit.  She was evaluated in the ER on 07/28/20 because of SOB and CP. Discharged to a SNF on 06/27/2020.  She has had a few ER visits within the past 1-2 months.  She was last hospitalized on 06/22/20 because MS changes. Dx'ed with acute encephalopathy , CHF exacerbation,and CAP. Brain MRI 06/23/20:  Motion degraded and prematurely terminated examination as described. No evidence of acute infarction. Redemonstrated chronic cortically based right frontal lobe MCA territory infarct. Background mild generalized parenchymal atrophy and chronic small vessel ischemic disease. Chronic right maxillary sinusitis.  Elevated ammonia 71 and BNP 1752. According to daughter Furosemide dose has been increased from 40 mg qd to bid.  CXR 06/27/20: small pulmonary effusions, CHF pattern.   DM II: She is now on Lantus 18 U daily.  Lab Results  Component Value Date   HGBA1C 8.5 (H) 06/07/2020   Home BS 150's, < 200's. Her daughter would like to have a pen. Negative for polydipsia,polyuria, or polyphagia.  Atrial fib: She is on Eliquis 5 mg bid. CAD-CFH: Follows with cardiologist, Dr Brittany Roberts. HTN: She is on Metoprolol succinate 50 mg daily, Isordil 30 mg daily,and Amlodipine 2.5 mg daily. Echo on 06/08/2020: LVEF 12-87%, diastolic dysfunction indeterminable.  Negative for unusual abdominal pain or nausea.  CKD III-VI: She is following with nephrologist, Dr Brittany Roberts. She has not noted gross hematuria,decreased urine output,or foam in urine.  Lab Results  Component Value Date   CREATININE 1.96 (H) 07/28/2020   BUN 50 (H) 07/28/2020   NA 138 07/28/2020   K 3.3 (L) 07/28/2020   CL 100 07/28/2020   CO2 28 07/28/2020   Appetite has decreased.  Asthma/COPD: She is on Albuterol inh 2 puff q 6 hours. Advair  250-50 mcg bid. Home O2 sats 70-80's, O2 supplementation up to 5 LPM. Family is taking turns to monitor her closely. Occasional cough and no wheezing. Her daughters live across the street.  She needs helps with ADL's and IADL's. States that she cannot sleep because episodes of SOB and chest tightness when she is in bed. She is requesting a sublingual medication she received, given by EMS, and felt "so good." She is not sure about name, ? Nitroglycerine. She is on Duloxetine 60 mg daily for anxiety and fibromyalgia. Anxiety is getting worse.  Seizure disorder: She is not longer on Keppra, discontinued. She has not had seizure episodes.  Review of Systems  Constitutional: Positive for activity change, appetite change and fatigue.  HENT: Positive for congestion and postnasal drip. Negative for mouth sores, nosebleeds and sore throat.   Cardiovascular: Negative for leg swelling.  Gastrointestinal: Negative for vomiting.       Negative for changes in bowel habits.  Genitourinary: Negative for dysuria.  Musculoskeletal: Positive for arthralgias, back pain, gait problem and myalgias.  Allergic/Immunologic: Positive for environmental allergies.  Neurological: Negative for syncope and facial asymmetry.  Psychiatric/Behavioral: Positive for sleep disturbance. The patient is nervous/anxious.   Rest see pertinent positives and negatives per HPI.  Current Outpatient Medications on File Prior to Visit  Medication Sig Dispense Refill  . Accu-Chek Softclix Lancets lancets Use as instructed 100 each 0  . acetaminophen (TYLENOL) 500 MG tablet Take 500 mg by mouth every 6 (six) hours as needed for moderate pain.    Marland Kitchen allopurinol (ZYLOPRIM) 100 MG tablet Take  2 tablets (200 mg total) by mouth daily. 60 tablet 0  . amLODipine (NORVASC) 2.5 MG tablet Take 1 tablet (2.5 mg total) by mouth daily. 30 tablet 0  . apixaban (ELIQUIS) 2.5 MG TABS tablet Take 1 tablet (2.5 mg total) by mouth 2 (two) times daily.  60 tablet 0  . Ascorbic Acid (VITAMIN C) 500 MG CAPS Take 1 capsule by mouth daily. 30 capsule 0  . atorvastatin (LIPITOR) 20 MG tablet TAKE 1 TABLET(20 MG) BY MOUTH DAILY 30 tablet 0  . B Complex Vitamins (VITAMIN B COMPLEX) TABS Take 1 tablet by mouth daily.    . Cholecalciferol (VITAMIN D3) 125 MCG (5000 UT) TABS Take 5,000 Units by mouth daily.    . DULoxetine (CYMBALTA) 60 MG capsule Take 1 capsule (60 mg total) by mouth daily. 30 capsule 0  . feeding supplement (ENSURE ENLIVE / ENSURE PLUS) LIQD Take 237 mLs by mouth 4 (four) times daily. 237 mL 12  . Fluocinolone Acetonide Scalp 0.01 % OIL Apply 1 application topically at bedtime as needed (itching scalp).     . Fluticasone-Salmeterol (ADVAIR) 250-50 MCG/DOSE AEPB Inhale 1 puff into the lungs 2 (two) times daily. 60 each 0  . furosemide (LASIX) 40 MG tablet Take 1 tablet (40 mg total) by mouth daily for 3 days. 3 tablet 0  . gabapentin (NEURONTIN) 100 MG capsule Take 1 capsule (100 mg total) by mouth at bedtime. 30 capsule 0  . glucose blood (ACCU-CHEK GUIDE) test strip Use to test blood sugars 1-2 times daily. Dx: e11.9 100 each 0  . insulin aspart (NOVOLOG) 100 UNIT/ML injection Inject 0-9 Units into the skin 3 (three) times daily with meals. CBG 70 - 120: 0 units  CBG 121 - 150: 1 unit  CBG 151 - 200: 2 units  CBG 201 - 250: 3 units  CBG 251 - 300: 5 units  CBG 301 - 350: 7 units  CBG 351 - 400 9 units 10 mL 1  . Insulin Pen Needle 31G X 5 MM MISC 4 each by Does not apply route daily as needed (used to administer insulin AC & HS). 120 each 0  . isosorbide mononitrate (IMDUR) 30 MG 24 hr tablet TAKE 1 TABLET(30 MG) BY MOUTH DAILY 30 tablet 0  . melatonin 5 MG TABS Take 5 mg by mouth.    . memantine (NAMENDA) 10 MG tablet Take 1 tablet (10 mg total) by mouth daily. 30 tablet 0  . metoprolol succinate (TOPROL-XL) 50 MG 24 hr tablet TAKE 1 TABLET BY MOUTH EVERY DAY. TAKE WITH OR IMMEDIATELY FOLLOWING A MEAL 30 tablet 0  . montelukast  (SINGULAIR) 10 MG tablet TAKE 1 TABLET(10 MG) BY MOUTH AT BEDTIME 30 tablet 0  . Omega-3 Fatty Acids (FISH OIL) 1000 MG CAPS Take 1,000 mg by mouth daily.     . pantoprazole (PROTONIX) 40 MG tablet TAKE 1 TABLET(40 MG) BY MOUTH DAILY 30 tablet 0  . Polyethyl Glycol-Propyl Glycol (SYSTANE) 0.4-0.3 % GEL ophthalmic gel Place 1 application into both eyes every 4 (four) hours as needed (dry eyes).    Marland Kitchen PROAIR HFA 108 (90 Base) MCG/ACT inhaler INHALE 2 PUFF BY MOUTH EVERY 4 HOURS AS NEEDED USE ONLY IF YOU ARE WHEEZING 8.5 g 0  . torsemide (DEMADEX) 20 MG tablet Take 1 tablet (20 mg total) by mouth daily. 30 tablet 0   No current facility-administered medications on file prior to visit.   Past Medical History:  Diagnosis Date  . Acute  delirium 01/14/2017  . Anxiety   . Arthritis    "all over"  . Asthma   . Bipolar disorder ([place])   . Complication of anesthesia    "w/right foot OR they gave me too much and they couldn't get me woke"  . Congestive heart failure, unspecified   . Coronary artery calcification seen on CAT scan 01/14/2017  . Coronary artery disease    "I've got 1 stent" (06/30/2015)  . Depression   . Discoid lupus   . Fibromyalgia    "I've been told I have this; can't take Lyrica cause I'm allergic to it" (06/30/2015)  . GERD (gastroesophageal reflux disease)   . History of gout   . History of hiatal hernia    "real bad" (06/30/2015)  . Hypertension   . Hypothyroidism   . Memory loss   . On home oxygen therapy    "2L q hs" (06/30/2015)  . OSA (obstructive sleep apnea)    "just use my oxygen; no mask" (06/30/2015)  . Other and unspecified hyperlipidemia   . Penetrating foot wound    left nonhealing foot wound on the dorsal surface  . Peripheral neuropathy   . Pneumonia "many of times"  . Refusal of blood transfusions as patient is Jehovah's Witness   . Renal failure, unspecified    "kidneys not working 100%" (06/30/2015)  . Seizure ([place])   . Sickle cell trait ([place])    . Systemic lupus ([place])   . Thoracic aortic atherosclerosis ([place]) 01/14/2017  . Type II diabetes mellitus ([place])   . Unspecified vitamin D deficiency    Allergies  Allergen Reactions  . Ace Inhibitors Other (See Comments)    Coincided with sig bump in creat. Retried and creat bumped again.   Marland Kitchen Penicillins Anaphylaxis, Swelling and Rash    Has patient had a PCN reaction causing immediate rash, facial/tongue/throat swelling, SOB or lightheadedness with hypotension: Yes Has patient had a PCN reaction causing severe rash involving mucus membranes or skin necrosis: Yes Has patient had a PCN reaction that required hospitalization: Yes Has patient had a PCN reaction occurring within the last 10 years: No If all of the above answers are "NO", then may proceed with Cephalosporin use.   Marland Kitchen Buspirone Other (See Comments)    pain   . Isosorb Dinitrate-Hydralazine Other (See Comments)    Sleep all the time.   . Pregabalin Swelling  . Ropinirole Hydrochloride Swelling  . [place] Rash    "Swelling of the tongue"    Social History   Socioeconomic History  . Marital status: Single    Spouse name: Not on file  . Number of children: 4  . Years of education: 70 th  . Highest education level: Not on file  Occupational History  . Occupation: Retired- Engineer, production    Employer: RETIRED  Tobacco Use  . Smoking status: Former Smoker    Packs/day: 3.00    Years: 40.00    Pack years: 120.00    Types: Cigarettes    Start date: 07/05/1960    Quit date: 07/05/1998    Years since quitting: 22.0  . Smokeless tobacco: Never Used  Vaping Use  . Vaping Use: Never used  Substance and Sexual Activity  . Alcohol use: No    Alcohol/week: 0.0 standard drinks    Comment: havent drank in over 40 years  . Drug use: No  . Sexual activity: Never  Other Topics Concern  . Not on file  Social History  Narrative   Health Care POA:    Emergency Contact: daughter, Lorenso Courier [phone] or [name]  [phone]   End of Life Plan: Does not want to be ventilated or feeding tubes.    Who lives with you: Lives alone   Any pets: none   Diet: Patient reports enjoying and eating junk food.  Does not regulate types of food and currently her dentures are broken so it is hard to eat some foods.   Exercise: Patient does not have an exercise plan.   Seatbelts: Patient reports wearing seatbelt when in vehicle.   Sun Exposure/Protection: Patient reports not going outside very often.   Hobbies: Patient enjoys reading the bible and watching game shows.    Right-handed.   1 cup caffeine per day.   Former smoker-stopped 2000   Alcohol none   Social Determinants of Radio broadcast assistant Strain: Not on file  Food Insecurity: Not on file  Transportation Needs: Not on file  Physical Activity: Not on file  Stress: Not on file  Social Connections: Not on file   Vitals:   07/30/20 1059  BP: 136/80  Pulse: 66  Resp: 16  SpO2: 93%   Body mass index is 47.43 kg/m.  Physical Exam Vitals and nursing note reviewed.  Constitutional:      General: She is not in acute distress.    Appearance: She is well-developed.  HENT:     Head: Normocephalic and atraumatic.     Mouth/Throat:     Mouth: Oropharynx is clear and moist and mucous membranes are normal. Mucous membranes are moist.     Comments: Edentulous. Eyes:     Conjunctiva/sclera: Conjunctivae normal.  Cardiovascular:     Rate and Rhythm: Normal rate. Rhythm irregular.     Comments: Distant heart sounds. Pulmonary:     Effort: Pulmonary effort is normal. No respiratory distress.     Breath sounds: Normal breath sounds.     Comments: Occasional wheezing. Abdominal:     Palpations: Abdomen is soft.     Tenderness: There is no abdominal tenderness.  Musculoskeletal:        General: No edema.  Lymphadenopathy:     Cervical: No cervical adenopathy.  Skin:    General: Skin is warm.     Findings: No erythema or rash.      Comments: Dry,scaly areas UE.  Neurological:     Mental Status: She is alert and oriented to person, place, and time.     Cranial Nerves: No cranial nerve deficit.     Deep Tendon Reflexes: Strength normal.     Comments: She is in a wheel chair.  Psychiatric:        Mood and Affect: Mood is anxious.    ASSESSMENT AND PLAN:  [name] was seen today for hospitalization follow-up.  Diagnoses and all orders for this visit: Orders Placed This Encounter  Procedures  . Ambulatory referral to Home Health  . Ambulatory referral to Hospice    Counseling regarding advance care planning and goals of care We have a general discussion about most of her chronic medical problems.  Discussed her wishes and goals of care. Her daughter would like for her to be comfortable and no hospitalizations. She would like to stay home and be able to sleep. She is not interested in further procedures, she and her daughter understand risk of complications. Continue current medications.  She and her daughter agree with hospice evaluation. In regard to end life care, her daughter  understands but Ms Hamor cannot make a decision today. Recommend having this conversation at home with her daughters and hospice team. We decided not to do labs today.  Chronic kidney disease (CKD), stage IV (severe) (HCC) Adequate hydration. She has appt with nephrologist.  Atherosclerosis of aorta ([place]) Seen on imaging. She is on Atorvastatin 20 mg daily and Eliquis 5 mg bid.  Chronic respiratory failure with hypoxia, on home O2 therapy (HCC) Contributing factors CHF and COPD. Continue O2 supplementation, titrate between 2-5 LPM to keep O2 sat 89%-90%.   Hypertensive heart disease with heart failure (HCC) BP adequately controlled. No changes in dose of Metoprolol succinate, Imdur,or Amlodipine.  Hypokalemia Mild. Some side effects of Thiazides discussed. K+ rich diet recommended.  Chronic heart failure with preserved  ejection fraction (HCC) Continue Metoprolol Succinate 50 mg daily and Furosemide. Appt with cardio later this month.  Anxiety disorder, unspecified type After discussion of side effects of Lorazepam, she would like to have medication to take for acute anxiety. Continue Duloxetine 60 mg daily.  -     LORazepam (ATIVAN) 0.5 MG tablet; Take 1 tablet (0.5 mg total) by mouth 2 (two) times daily as needed for anxiety.  Type 2 diabetes mellitus with diabetic neuropathy, with long-term current use of insulin ([place]) Problem is not well controlled. Continue same dose of Lantus for now, changes to pen. Continue monitoring BS's.  -     insulin glargine (LANTUS) 100 unit/mL SOPN; Inject 10 Units into the skin at bedtime.  Spent 46 minutes with pt.  During this time history was obtained and documented, examination was performed, prior labs/imaging reviewed, and assessment/plan discussed. Return if symptoms worsen or fail to improve.    G. [name], MD  [facility]. [place] office.   A few things to remember from today's visit:  If you need refills please call your pharmacy. Do not use My Chart to request refills or for acute issues that need immediate attention.  Ativan started today for acute anxiety.  Hospice Hospice is a service that provides people who are terminally ill and their families with medical, spiritual, and psychological support. It aims to improve a person's quality of life by keeping the person as comfortable as possible in the final stages of life. Who makes up the hospice care team? The following people make up a hospice care team:  The person receiving care and his or her family.  A nurse and a hospice doctor. Your primary health care provider can also be included.  A Education officer, museum or Tourist information centre manager.  A religious or spiritual leader.  Specialists such as: ? A dietitian. ? A mental health counselor. ? Physical and occupational  therapists. ? Bereavement coordinator.  Trained volunteers who can help with care.   What services are included in hospice care? Hospice services can vary, depending on the organization. Generally, they include:  Ways to keep you comfortable, such as: ? Providing care in your home or in a home-like setting. ? Providing relief from pain and symptoms. The staff will supply all medicines and equipment to keep you comfortable and alert enough to enjoy the company of friends and family. ? Working with your loved ones to help them meet your needs and provide much of your basic care. This helps you to enjoy their support.  Visits or care from a nurse and doctor. This may include 24-hour on-call services.  Visits from other specialists who offer services, such as: ? Counseling to meet your emotional, spiritual,  and social needs as well as those needs of your family. ? Massage therapy. ? Nutrition therapy. ? Physical and occupational therapy. ? Art or music therapy. ? Spiritual care to meet your needs and your family's needs. It may involve:  Helping you and your family understand the dying process.  Helping you say goodbye to your family and friends.  Performing a specific religious ceremony or ritual.  Companionship when you are alone.  Allowing you and your family to rest. Hospice staff may do light housekeeping, prepare meals, and run errands.  Short-term inpatient care, if something cannot be managed in the home.  Bereavement support for grieving family members. When should hospice care begin? Most people who use hospice are believed to have 6 months or less to live.  Your family and health care providers can help you decide when hospice services should begin.  If you live longer than 6 months but your condition does not improve, your doctor may be able to approve you for continued hospice care.  If your condition improves, you may discontinue the program. What should I  consider before selecting a program? Most hospice programs are run by nonprofit, independent organizations. Some are affiliated with hospitals, nursing homes, or home health care agencies. Hospice programs can take place in your home or at a hospice center, hospital, or skilled nursing facility. When choosing a hospice program, ask about the following:  What services are available to me and my loved ones? ? What does it cost? Is it covered by insurance? ? Is the program reviewed and licensed by the state or certified in some other way? ? How will my pain and symptoms be managed? ? Will you provide emotional and spiritual support?  If I choose a hospice center or nursing home, where is the hospice center located? Is it convenient for family and friends? ? If I choose a hospice center or nursing home, will my family and friends be able to visit any time? ? If my circumstances change, can the services be provided in a different setting, such as in my home or in the hospital?  Who makes up the hospice care team? How are they trained or screened? ? How involved can my loved ones be? ? Whom can my family call with questions? ? How is my health care provider involved? Where can I learn more about hospice? You can learn about existing hospice programs in your area from your health care providers. The websites of the following organizations have helpful information:  [facility] and Palliative Care Organization: http://www.brown-buchanan.com/  National Association for [place]: http://massey-hart.com/  Hospice Foundation of America: www.hospicefoundation.org  American Cancer Society: www.cancer.org  eHospice: ehospice.com These agencies also can provide information:  A local agency on aging.  Your local [employer] chapter.  Your state's department of health or social services. Summary  Hospice is a service that provides people who are terminally ill and their families with medical, spiritual, and  psychological support.  Hospice aims to improve your quality of life by keeping you as comfortable as possible in the final stages of life.  Hospice teams often include a doctor, nurse, social worker, counselor, religious or spiritual leader, dietitian, therapists, and volunteers.  Hospice care generally includes pain management, visits from doctors and nurses, physical and occupational therapy, nutrition therapy, spiritual and emotional counseling, caregiver support, and bereavement support for grieving family members.  Hospice programs can take place in your home or at a hospice center, hospital,  or skilled nursing facility. This information is not intended to replace advice given to you by your health care provider. Make sure you discuss any questions you have with your health care provider. Document Revised: 03/25/2020 Document Reviewed: 03/25/2020 Elsevier Patient Education  2021 [place].  Please be sure medication list is accurate. If a new problem present, please set up appointment sooner than planned today.

## 2020-07-31 ENCOUNTER — Telehealth: Payer: Self-pay | Admitting: Family Medicine

## 2020-07-31 DIAGNOSIS — E1122 Type 2 diabetes mellitus with diabetic chronic kidney disease: Secondary | ICD-10-CM | POA: Diagnosis not present

## 2020-07-31 DIAGNOSIS — E039 Hypothyroidism, unspecified: Secondary | ICD-10-CM | POA: Diagnosis not present

## 2020-07-31 DIAGNOSIS — I110 Hypertensive heart disease with heart failure: Secondary | ICD-10-CM | POA: Diagnosis not present

## 2020-07-31 DIAGNOSIS — M329 Systemic lupus erythematosus, unspecified: Secondary | ICD-10-CM | POA: Diagnosis not present

## 2020-07-31 DIAGNOSIS — I4891 Unspecified atrial fibrillation: Secondary | ICD-10-CM

## 2020-07-31 DIAGNOSIS — I422 Other hypertrophic cardiomyopathy: Secondary | ICD-10-CM | POA: Diagnosis not present

## 2020-07-31 DIAGNOSIS — G9340 Encephalopathy, unspecified: Secondary | ICD-10-CM | POA: Diagnosis not present

## 2020-07-31 DIAGNOSIS — M797 Fibromyalgia: Secondary | ICD-10-CM | POA: Diagnosis not present

## 2020-07-31 DIAGNOSIS — E1142 Type 2 diabetes mellitus with diabetic polyneuropathy: Secondary | ICD-10-CM | POA: Diagnosis not present

## 2020-07-31 DIAGNOSIS — J449 Chronic obstructive pulmonary disease, unspecified: Secondary | ICD-10-CM | POA: Diagnosis not present

## 2020-07-31 DIAGNOSIS — N184 Chronic kidney disease, stage 4 (severe): Secondary | ICD-10-CM | POA: Diagnosis not present

## 2020-07-31 DIAGNOSIS — I700 Atherosclerosis of aorta: Secondary | ICD-10-CM | POA: Diagnosis not present

## 2020-07-31 DIAGNOSIS — N3000 Acute cystitis without hematuria: Secondary | ICD-10-CM | POA: Diagnosis not present

## 2020-07-31 DIAGNOSIS — I5033 Acute on chronic diastolic (congestive) heart failure: Secondary | ICD-10-CM

## 2020-07-31 DIAGNOSIS — I25118 Atherosclerotic heart disease of native coronary artery with other forms of angina pectoris: Secondary | ICD-10-CM | POA: Diagnosis not present

## 2020-07-31 DIAGNOSIS — I480 Paroxysmal atrial fibrillation: Secondary | ICD-10-CM | POA: Diagnosis not present

## 2020-07-31 DIAGNOSIS — J309 Allergic rhinitis, unspecified: Secondary | ICD-10-CM | POA: Diagnosis not present

## 2020-07-31 DIAGNOSIS — J9621 Acute and chronic respiratory failure with hypoxia: Secondary | ICD-10-CM | POA: Diagnosis not present

## 2020-07-31 DIAGNOSIS — F419 Anxiety disorder, unspecified: Secondary | ICD-10-CM | POA: Insufficient documentation

## 2020-07-31 NOTE — Telephone Encounter (Signed)
Pt daughter call and stated she think pt can use the portable oxygen tank that go up to  3 , but she don't the steel one.

## 2020-08-01 ENCOUNTER — Telehealth: Payer: Self-pay | Admitting: Family Medicine

## 2020-08-01 DIAGNOSIS — E039 Hypothyroidism, unspecified: Secondary | ICD-10-CM | POA: Diagnosis not present

## 2020-08-01 DIAGNOSIS — I700 Atherosclerosis of aorta: Secondary | ICD-10-CM | POA: Diagnosis not present

## 2020-08-01 DIAGNOSIS — G9340 Encephalopathy, unspecified: Secondary | ICD-10-CM | POA: Diagnosis not present

## 2020-08-01 DIAGNOSIS — E1142 Type 2 diabetes mellitus with diabetic polyneuropathy: Secondary | ICD-10-CM | POA: Diagnosis not present

## 2020-08-01 DIAGNOSIS — J309 Allergic rhinitis, unspecified: Secondary | ICD-10-CM | POA: Diagnosis not present

## 2020-08-01 DIAGNOSIS — I480 Paroxysmal atrial fibrillation: Secondary | ICD-10-CM | POA: Diagnosis not present

## 2020-08-01 DIAGNOSIS — E1122 Type 2 diabetes mellitus with diabetic chronic kidney disease: Secondary | ICD-10-CM | POA: Diagnosis not present

## 2020-08-01 DIAGNOSIS — I5033 Acute on chronic diastolic (congestive) heart failure: Secondary | ICD-10-CM | POA: Diagnosis not present

## 2020-08-01 DIAGNOSIS — N3000 Acute cystitis without hematuria: Secondary | ICD-10-CM | POA: Diagnosis not present

## 2020-08-01 DIAGNOSIS — M329 Systemic lupus erythematosus, unspecified: Secondary | ICD-10-CM | POA: Diagnosis not present

## 2020-08-01 DIAGNOSIS — I422 Other hypertrophic cardiomyopathy: Secondary | ICD-10-CM | POA: Diagnosis not present

## 2020-08-01 DIAGNOSIS — M797 Fibromyalgia: Secondary | ICD-10-CM | POA: Diagnosis not present

## 2020-08-01 DIAGNOSIS — J9621 Acute and chronic respiratory failure with hypoxia: Secondary | ICD-10-CM | POA: Diagnosis not present

## 2020-08-01 DIAGNOSIS — J449 Chronic obstructive pulmonary disease, unspecified: Secondary | ICD-10-CM | POA: Diagnosis not present

## 2020-08-01 DIAGNOSIS — N184 Chronic kidney disease, stage 4 (severe): Secondary | ICD-10-CM | POA: Diagnosis not present

## 2020-08-01 DIAGNOSIS — I25118 Atherosclerotic heart disease of native coronary artery with other forms of angina pectoris: Secondary | ICD-10-CM | POA: Diagnosis not present

## 2020-08-01 DIAGNOSIS — I110 Hypertensive heart disease with heart failure: Secondary | ICD-10-CM | POA: Diagnosis not present

## 2020-08-01 NOTE — Telephone Encounter (Signed)
Duplicate message. 

## 2020-08-01 NOTE — Telephone Encounter (Signed)
Noted  

## 2020-08-01 NOTE — Telephone Encounter (Signed)
Alben  from [facility] [phone]  would like verbal orders for speech therapy for one  time a week  for  four weeks

## 2020-08-01 NOTE — Telephone Encounter (Signed)
Verbal given 

## 2020-08-01 NOTE — Telephone Encounter (Signed)
Lmovm for Alben to call the office back.

## 2020-08-01 NOTE — Telephone Encounter (Signed)
Alben  from [facility] [phone]  would like verbal orders for speech therapy for one  time a week  for  four weeks

## 2020-08-01 NOTE — Telephone Encounter (Signed)
I spoke with pt's daughter. She is requesting a portable oxygen tank and also a wheelchair for pt. Advised pt's daughter that I will place orders for both with [employer] and they will reach out to them to set up a time for delivery. Pt's daughter verbalized understanding.

## 2020-08-01 NOTE — Telephone Encounter (Signed)
Brittany Roberts is calling to get verbal orders for Nursing 1 week for the episode and monitoring and education for cardio pulmonary.  May leave a detail msg on her secured voicemail if no answer.

## 2020-08-01 NOTE — Telephone Encounter (Signed)
[facility] called to let Dr. [name] know that the patient did not have her 2 PT visits this week because she was in the hospital and now she is home and they are discussing Hospice.  [facility]

## 2020-08-04 ENCOUNTER — Other Ambulatory Visit: Payer: Self-pay | Admitting: Orthopedic Surgery

## 2020-08-04 ENCOUNTER — Telehealth: Payer: Self-pay | Admitting: Family Medicine

## 2020-08-04 ENCOUNTER — Other Ambulatory Visit: Payer: Self-pay

## 2020-08-04 ENCOUNTER — Ambulatory Visit: Payer: Medicare Other | Admitting: Cardiology

## 2020-08-04 ENCOUNTER — Encounter: Payer: Self-pay | Admitting: Cardiology

## 2020-08-04 ENCOUNTER — Other Ambulatory Visit: Payer: Self-pay | Admitting: Cardiology

## 2020-08-04 VITALS — BP 130/56 | HR 71 | Temp 98.4°F | Resp 14 | Ht 63.5 in | Wt 280.2 lb

## 2020-08-04 DIAGNOSIS — I110 Hypertensive heart disease with heart failure: Secondary | ICD-10-CM | POA: Diagnosis not present

## 2020-08-04 DIAGNOSIS — I480 Paroxysmal atrial fibrillation: Secondary | ICD-10-CM | POA: Diagnosis not present

## 2020-08-04 DIAGNOSIS — N183 Chronic kidney disease, stage 3 unspecified: Secondary | ICD-10-CM

## 2020-08-04 DIAGNOSIS — Z794 Long term (current) use of insulin: Secondary | ICD-10-CM

## 2020-08-04 MED ORDER — TORSEMIDE 20 MG PO TABS: 20.0000 mg | ORAL_TABLET | Freq: Two times a day (BID) | ORAL | 1 refills | Status: DC

## 2020-08-04 NOTE — Telephone Encounter (Signed)
fyi

## 2020-08-04 NOTE — Telephone Encounter (Signed)
Adam from [facility] is calling to advise he received the referral for hospice care but the family has delayed starting.  They have stated they would like to talk more about it first before starting her hospice.

## 2020-08-04 NOTE — Progress Notes (Signed)
Subjective:   Brittany Roberts, female    DOB: March 27, 1941, 80 y.o.   MRN: [id number]   Chief complaint:  Chronic systolic heart failure  80 year-old African-American female with nonischemic cardiomyopathy, CAD (PCI RCA), obesity, hypertension, type II diabetes mellitus, morbid obesity, paroxysmal Afib, h/o LV apical thrombus (12/2019), peripheral neuropathy, lupus, now with acute on chronic systolic heart failure, LV apical thrombus.  Patient is here today with her daughter. Patient was recently seen [facility] ER, and was seen to have mild heart failure decompensation. She was given PO lasix 40 mg bid, on top of torsemide 20 mg daily. She remains on stable 3 L O2.   Daughter tells me that they are working with PCP Dr. [name] for hospice arrangement given patient's multiple medical issues   Current Outpatient Medications on File Prior to Visit  Medication Sig Dispense Refill  . Accu-Chek Softclix Lancets lancets Use as instructed 100 each 0  . acetaminophen (TYLENOL) 500 MG tablet Take 500 mg by mouth every 6 (six) hours as needed for moderate pain.    Marland Kitchen allopurinol (ZYLOPRIM) 100 MG tablet Take 2 tablets (200 mg total) by mouth daily. 60 tablet 0  . amLODipine (NORVASC) 2.5 MG tablet Take 1 tablet (2.5 mg total) by mouth daily. 30 tablet 0  . apixaban (ELIQUIS) 2.5 MG TABS tablet Take 1 tablet (2.5 mg total) by mouth 2 (two) times daily. 60 tablet 0  . Ascorbic Acid (VITAMIN C) 500 MG CAPS Take 1 capsule by mouth daily. 30 capsule 0  . atorvastatin (LIPITOR) 20 MG tablet TAKE 1 TABLET(20 MG) BY MOUTH DAILY 30 tablet 0  . B Complex Vitamins (VITAMIN B COMPLEX) TABS Take 1 tablet by mouth daily.    . Cholecalciferol (VITAMIN D3) 125 MCG (5000 UT) TABS Take 5,000 Units by mouth daily.    . DULoxetine (CYMBALTA) 60 MG capsule Take 1 capsule (60 mg total) by mouth daily. 30 capsule 0  . feeding supplement (ENSURE ENLIVE / ENSURE PLUS) LIQD Take 237 mLs by mouth 4 (four) times daily. 237 mL 12   . Fluocinolone Acetonide Scalp 0.01 % OIL Apply 1 application topically at bedtime as needed (itching scalp).     . Fluticasone-Salmeterol (ADVAIR) 250-50 MCG/DOSE AEPB Inhale 1 puff into the lungs 2 (two) times daily. 60 each 0  . furosemide (LASIX) 40 MG tablet Take 1 tablet (40 mg total) by mouth daily for 3 days. 3 tablet 0  . gabapentin (NEURONTIN) 100 MG capsule Take 1 capsule (100 mg total) by mouth at bedtime. 30 capsule 0  . glucose blood (ACCU-CHEK GUIDE) test strip Use to test blood sugars 1-2 times daily. Dx: e11.9 100 each 0  . insulin aspart (NOVOLOG) 100 UNIT/ML injection Inject 0-9 Units into the skin 3 (three) times daily with meals. CBG 70 - 120: 0 units  CBG 121 - 150: 1 unit  CBG 151 - 200: 2 units  CBG 201 - 250: 3 units  CBG 251 - 300: 5 units  CBG 301 - 350: 7 units  CBG 351 - 400 9 units 10 mL 1  . insulin glargine (LANTUS) 100 unit/mL SOPN Inject 10 Units into the skin at bedtime. 9 mL 2  . Insulin Pen Needle 31G X 5 MM MISC 4 each by Does not apply route daily as needed (used to administer insulin AC & HS). 120 each 0  . isosorbide mononitrate (IMDUR) 30 MG 24 hr tablet TAKE 1 TABLET(30 MG) BY MOUTH DAILY  30 tablet 0  . LORazepam (ATIVAN) 0.5 MG tablet Take 1 tablet (0.5 mg total) by mouth 2 (two) times daily as needed for anxiety. 60 tablet 0  . melatonin 5 MG TABS Take 5 mg by mouth.    . memantine (NAMENDA) 10 MG tablet Take 1 tablet (10 mg total) by mouth daily. 30 tablet 0  . metoprolol succinate (TOPROL-XL) 50 MG 24 hr tablet TAKE 1 TABLET BY MOUTH EVERY DAY. TAKE WITH OR IMMEDIATELY FOLLOWING A MEAL 30 tablet 0  . montelukast (SINGULAIR) 10 MG tablet TAKE 1 TABLET(10 MG) BY MOUTH AT BEDTIME 30 tablet 0  . Omega-3 Fatty Acids (FISH OIL) 1000 MG CAPS Take 1,000 mg by mouth daily.     . pantoprazole (PROTONIX) 40 MG tablet TAKE 1 TABLET(40 MG) BY MOUTH DAILY 30 tablet 0  . Polyethyl Glycol-Propyl Glycol (SYSTANE) 0.4-0.3 % GEL ophthalmic gel Place 1 application  into both eyes every 4 (four) hours as needed (dry eyes).    Marland Kitchen PROAIR HFA 108 (90 Base) MCG/ACT inhaler INHALE 2 PUFF BY MOUTH EVERY 4 HOURS AS NEEDED USE ONLY IF YOU ARE WHEEZING 8.5 g 0  . torsemide (DEMADEX) 20 MG tablet Take 1 tablet (20 mg total) by mouth daily. 30 tablet 0   No current facility-administered medications on file prior to visit.    Cardiovascular studies:  EKG 07/28/2020: Sinus rhythm w/PVC Low voltage  Echocardiogram 06/08/2020: 1. Indeterminate diastolic function due to MAC. Left ventricular ejection  fraction, by estimation, is 55 to 60%. The left ventricle has normal  function. The left ventricle has no regional wall motion abnormalities.  There is moderate left ventricular  hypertrophy. Left ventricular diastolic parameters are indeterminate.  2. Right ventricular systolic function is normal. The right ventricular  size is normal.  3. Left atrial size was severely dilated.  4. Grossly normal in size.  5. The mitral valve is degenerative. No evidence of mitral valve  regurgitation. No evidence of mitral stenosis. Moderate to severe mitral  annular calcification.  6. The aortic valve is calcified. Aortic valve regurgitation is not  visualized. Mild aortic valve sclerosis is present, with no evidence of  aortic valve stenosis.  7. The inferior vena cava is normal in size with greater than 50%  respiratory variability, suggesting right atrial pressure of 3 mmHg.    Coronary angiography 07/01/2015: 1. Findings consistent with nonischemic cardiomyopathy, LVEF 25-30% with global hypokinesis. 2. Previously placed 2.5 mm DES in the PDA branch of the right coronary artery in 2014 is widely patent. Mid RCA has a 50% stenosis, FFR 0.84, not hemodynamically significant. 3. Diffusely diseased left coronary arteries, especially involving the distal LAD and circumflex coronary artery, may be the etiology for her angina pectoris.   Recent  labs: 07/28/2020: Glucose 235, BUN/Cr 50/1.96. EGFR 25. Na/K 138/3.3.  H/H 10/30. MCV 78. Platelets 176  Results for CARRAH, EPPOLITO (MRN [id number]) as of 08/04/2020 11:20  Ref. Range 07/28/2020 03:02 07/28/2020 15:05  B Natriuretic Peptide Latest Ref Range: 0.0 - 100.0 pg/mL 1,629.5 (H)   Troponin I (High Sensitivity) Latest Ref Range: <18 ng/L  50 (H)    Jul-Sep 2021: Glucose 132, BUN/Cr 50/1.8. EGFR 25.  H/H 04/10/33.6. MCV 82.8. Platelets 207 HbA1C 7.7%  12/11/2019: Glucose 255, BUN/Cr 44/1.9. EGFR 30. Na/K 137/3.8. Rest of the CMP normal H/H 10.4/32.2. MCV 77.6. Platelets 236  03/14/2019: Glucose 84, BUN/Cr 40/1.87. EGFR 31. Na/K 143/5.2. Rest of the CMP normal HbA1C 6.5% TSH 1.2 normal  07/2018:  H/H 12/37. MCV 74. Platelets 239  2016: Chol 103, TG 81, HDL 38, LDL 49  Review of Systems  Cardiovascular: Negative for chest pain, dyspnea on exertion, leg swelling, palpitations and syncope.        Vitals:   08/04/20 1126  BP: (!) 130/56  Pulse: 71  Resp: 14  Temp: 98.4 F (36.9 C)  SpO2: 99%    Physical Exam Vitals and nursing note reviewed.  Constitutional:      General: She is not in acute distress.    Appearance: She is obese.     Comments: On supplemental oxygen  Neck:     Vascular: No JVD.  Cardiovascular:     Rate and Rhythm: Normal rate and regular rhythm.     Heart sounds: Normal heart sounds. No murmur heard.   Pulmonary:     Effort: Pulmonary effort is normal.     Breath sounds: Normal breath sounds. No wheezing or rales.        Objective:    Physical Exam Vitals and nursing note reviewed.  Constitutional:      Appearance: She is well-developed.  Neck:     Vascular: No JVD.  Cardiovascular:     Rate and Rhythm: Normal rate and regular rhythm.     Pulses: Intact distal pulses.     Heart sounds: Normal heart sounds. No murmur heard.   Pulmonary:     Effort: Pulmonary effort is normal.     Breath sounds: Normal breath sounds. No wheezing  or rales.           Assessment & Recommendations:   80 year-old African-American female with nonischemic cardiomyopathy, CAD (PCI RCA), obesity, hypertension, type II diabetes mellitus, morbid obesity, paroxysmal Afib, h/o LV apical thrombus (12/2019), peripheral neuropathy, lupus, now with acute on chronic systolic heart failure, LV apical thrombus.  Chronic systolic heart failure: EF improved, probably now has component of HFpEF. With recent decompensation in mind, I have increase her torsemide to 20 mg bid.  Check BMP in a week  Paroxysmal Afib: CHA2DS2CASc score 6, annual stoke risk 9% Continue eliquis 2.5 mg bid  Continue hospice arrangements with PCP  I will see her on as needed basis   Nigel Mormon, MD [facility] Pager: [phone] Office: [phone] If no answer Cell [phone]

## 2020-08-04 NOTE — Telephone Encounter (Signed)
Verbal given to [place].

## 2020-08-04 NOTE — Telephone Encounter (Signed)
On med list she has Furosemide and Torsemide. Can you please ask pt which one she is taking and dose. Thanks, BJ

## 2020-08-05 NOTE — Telephone Encounter (Signed)
Verbal given to Adam for DNR to be given to pt.

## 2020-08-07 ENCOUNTER — Telehealth: Payer: Self-pay | Admitting: Family Medicine

## 2020-08-07 ENCOUNTER — Telehealth: Payer: Self-pay

## 2020-08-07 NOTE — Telephone Encounter (Signed)
Why?  

## 2020-08-07 NOTE — Telephone Encounter (Signed)
Pt is calling in to see if Dr. [name] has received her CAP form so that they are able to get some help in the home.

## 2020-08-07 NOTE — Telephone Encounter (Signed)
Brittany Roberts from [facility] called regarding mutual patient she was admitted today and they saw patient is on Eliquis they want to change patient to coumadin please advise

## 2020-08-07 NOTE — Telephone Encounter (Signed)
[facility] hospice nurse, [name] [phone]), called stating that pt was referred to home hospice care and will be under [facility] services moving forward. Per hospice nurse, Alen Blew and Eliquis are not on formulary and requesting to transition to warfarin. Per Dr. Virgina Jock, okay to transition. Hospice nurse to call in INR readings to the office moving forward. Pharmacist to review reading and to call the nurse back with dosing directions. Currently waiting for pt to complete her current Eliquis supply before transitioning over to warfarin. [name] to call pt and confirm the quantity of remaining eliquis tablets. Will consider bridging to warfarin 12 hours after last dose of apixaban with therapeutic lovenox and will plan on continuing lovenox injections till INR is therapeutic. Pt was previously maintained on weekly dose of  warfarin 1.25 mg (2.5 mg x 0.5) every day before transitioning to Eliquis. May consider starting on low startin gwarfarin dose in setting of pt's PMH of CHF, hx of nosebleeds, renal dysfunction, and elderly age. Hospice nurse to call back to confirm quantity of remaining eliquis supply and to coordinate date of transition to warfarin

## 2020-08-07 NOTE — Telephone Encounter (Signed)
Brittany Roberts w/[facility] is calling in to get clarification on some of pts medications and to discontinue some would like to have a call back.

## 2020-08-08 NOTE — Telephone Encounter (Signed)
Given the fact she is in hospice now and if family agrees , it is ok to discontinue. Thanks, BJ

## 2020-08-08 NOTE — Telephone Encounter (Signed)
I spoke with Brittany Roberts. We went over pt's medications.  They are wanting to know if the Namenda and Atorvastatin can be d/c'd since it isn't on their formulary?

## 2020-08-08 NOTE — Telephone Encounter (Signed)
I spoke with Brittany Roberts, she verbalized understanding.

## 2020-08-08 NOTE — Telephone Encounter (Signed)
I spoke with pt's daughter. Referral form printed off & will be filled out and faxed in.

## 2020-08-08 NOTE — Telephone Encounter (Signed)
CAP/DA form filled out & faxed into  [employer] with confirmation received.

## 2020-08-08 NOTE — Telephone Encounter (Signed)
I left a message for Brittany Roberts @ [facility] to return my call in regards to the CAP form, we haven't received it yet.

## 2020-08-11 NOTE — Telephone Encounter (Signed)
Hospice nurse, [name], called today stating that pt has 25 tablets of Eliquis remaining at home. Previously considered transitioning to warfarin due to hospice formulary reasons, but pt noted that have 2 episodes of hemoptysis, frequent epistaxis, and recent hemorrhoids complains with associated hematochezia over the past two weeks. Given pt's extensive recent bleeding history and pt's current hospice status; reviewed with Dr. Virgina Jock about the benefits of continued anticoagulation therapy. Per Dr. Virgina Jock, okay to discontinue anticoagulation if okay with the patient. Reviewed the assessment of risk and benefit with the hospice nurse about continuing anticoagulation and relaying it to the patient and family members about deciding to continue with the transition to warfarin or discontinue eliquis. Hospice nurse to discuss with the pt and family members and to notify the office.

## 2020-08-15 ENCOUNTER — Other Ambulatory Visit: Payer: Self-pay

## 2020-08-15 ENCOUNTER — Telehealth: Payer: Self-pay

## 2020-08-15 DIAGNOSIS — E1140 Type 2 diabetes mellitus with diabetic neuropathy, unspecified: Secondary | ICD-10-CM

## 2020-08-15 MED ORDER — INSULIN GLARGINE 100 UNITS/ML SOLOSTAR PEN: 10.0000 [IU] | PEN_INJECTOR | Freq: Every day | SUBCUTANEOUS | 2 refills | Status: DC

## 2020-08-15 MED ORDER — SULFAMETHOXAZOLE-TRIMETHOPRIM 800-160 MG PO TABS: 1.0000 | ORAL_TABLET | Freq: Every day | ORAL | 0 refills | Status: AC

## 2020-08-15 NOTE — Telephone Encounter (Signed)
Given her medical conditions, I think she will benefit from COVID 19 booster. Thanks, BJ

## 2020-08-15 NOTE — Telephone Encounter (Signed)
Brittany Roberts from [facility] [phone]  They suspect that the patient has another UTI and wants to know if Dr. [name] can call in an antibiotic before the weekend.  She is going to try and get a sample today but it takes some times for the culture to grow.

## 2020-08-15 NOTE — Telephone Encounter (Signed)
I spoke with [name], Rx sent in to pharmacy.

## 2020-08-15 NOTE — Telephone Encounter (Signed)
Estimated Creatinine Clearance: 30 mL/min (A) (by C-G formula based on SCr of 1.96 mg/dL (H)). Bactrim DS once daily x 5 days . Thanks, BJ

## 2020-08-15 NOTE — Addendum Note (Signed)
Addended by: Rodrigo Ran on: 08/15/2020 04:22 PM   Modules accepted: Orders

## 2020-08-15 NOTE — Telephone Encounter (Signed)
Patients daughter called in wanting to ask provider if patient should get covid booster. Does provider think its a good idea that patient have the 3rd vaccine    Please call and advise

## 2020-08-15 NOTE — Telephone Encounter (Signed)
I spoke with pt's daughter, she is aware of message below.

## 2020-08-18 ENCOUNTER — Other Ambulatory Visit: Payer: Self-pay

## 2020-08-18 DIAGNOSIS — E1140 Type 2 diabetes mellitus with diabetic neuropathy, unspecified: Secondary | ICD-10-CM

## 2020-08-18 DIAGNOSIS — Z794 Long term (current) use of insulin: Secondary | ICD-10-CM

## 2020-08-18 MED ORDER — INSULIN GLARGINE 100 UNITS/ML SOLOSTAR PEN: 10.0000 [IU] | PEN_INJECTOR | Freq: Every day | SUBCUTANEOUS | 2 refills | Status: DC

## 2020-08-22 ENCOUNTER — Telehealth: Payer: Self-pay | Admitting: Family Medicine

## 2020-08-22 ENCOUNTER — Other Ambulatory Visit: Payer: Self-pay | Admitting: Family Medicine

## 2020-08-22 MED ORDER — CEPHALEXIN 500 MG PO CAPS: 500.0000 mg | ORAL_CAPSULE | Freq: Two times a day (BID) | ORAL | 0 refills | Status: AC

## 2020-08-22 MED ORDER — ALBUTEROL SULFATE (2.5 MG/3ML) 0.083% IN NEBU: 2.5000 mg | INHALATION_SOLUTION | Freq: Three times a day (TID) | RESPIRATORY_TRACT | 1 refills | Status: DC | PRN

## 2020-08-22 NOTE — Telephone Encounter (Signed)
It seems like the only option here is cephalexin 500 mg bid x 5 days, Rx sent. She is allergic to penicillin, sometimes it can be cross reaction with cephalosporins but in general it is well tolerated. Can you please ask pt if she has taken this abx in the past, I think she has. Thanks, BJ

## 2020-08-22 NOTE — Telephone Encounter (Signed)
Hospice nurse  [name]  stated that the pt still has a UTI  and she is still having frequent urination increased confusion  lab results showed the antibiotic that she was previously on is not adequate for infection; want to know if the patient can have a different medication. She is wheezing would like to see if she can have a nebulizer it works better for her, please call [phone]

## 2020-08-22 NOTE — Telephone Encounter (Signed)
I spoke with [name], hospice nurse. We went over info below & she verbalized understanding. I also called pt's daughter and went over the info below as well. They are aware to look out for any side effects. Rx sent in for nebulizer solution.

## 2020-08-25 ENCOUNTER — Telehealth: Payer: Self-pay

## 2020-08-25 ENCOUNTER — Encounter: Payer: Self-pay | Admitting: Family Medicine

## 2020-08-25 DIAGNOSIS — R410 Disorientation, unspecified: Secondary | ICD-10-CM | POA: Diagnosis not present

## 2020-08-25 DIAGNOSIS — R791 Abnormal coagulation profile: Secondary | ICD-10-CM | POA: Diagnosis not present

## 2020-08-25 NOTE — Telephone Encounter (Signed)
We can do some labs: BMP,CBC,and ammonia level (as requested). She is on Ativan,which can be used more often if needed for agitation. If any pain we can add Hydrocodone-Acetaminophen. She tolerated oral cephalexin well with no allergic reaction, depending of lab results,we could change to IV abx. She is in hospice, so I am not recommending going to the ER unless family would like to do so. Thanks, BJ

## 2020-08-25 NOTE — Telephone Encounter (Signed)
Brittany Roberts called back - they don't need the orders faxed, they took a verbal.  Routing encounter to PCP as FYI.

## 2020-08-25 NOTE — Telephone Encounter (Signed)
I spoke with Brittany Roberts. Lab orders will be faxed over - she will call me back with the fax.   Pt is not really having any uncomfortable pain, her left arm was a little sore when she moved it.   Brittany Roberts doesn't think the ativan is helping at all with pt's sleep - but unsure what else could be considered.

## 2020-08-25 NOTE — Telephone Encounter (Signed)
I spoke with [name], RN from hospice. Pt has not had any change since starting the Keflex. She is still agitated, weak, and not sleeping.   [name] was wanting to know if you possibly wanted to order lactulose due to possible high ammonia levels? This has been a problem for pt in the past.  They also want to know if there is something to help her sleep - the ativan is not working.  CB is [phone]

## 2020-08-26 ENCOUNTER — Encounter: Payer: Self-pay | Admitting: Family Medicine

## 2020-08-26 ENCOUNTER — Telehealth: Payer: Self-pay | Admitting: Pharmacist

## 2020-08-26 ENCOUNTER — Other Ambulatory Visit: Payer: Self-pay | Admitting: Family Medicine

## 2020-08-26 ENCOUNTER — Telehealth (INDEPENDENT_AMBULATORY_CARE_PROVIDER_SITE_OTHER): Payer: Medicare Other | Admitting: Family Medicine

## 2020-08-26 DIAGNOSIS — Z515 Encounter for palliative care: Secondary | ICD-10-CM | POA: Diagnosis not present

## 2020-08-26 DIAGNOSIS — N390 Urinary tract infection, site not specified: Secondary | ICD-10-CM

## 2020-08-26 DIAGNOSIS — G4700 Insomnia, unspecified: Secondary | ICD-10-CM

## 2020-08-26 DIAGNOSIS — Z7901 Long term (current) use of anticoagulants: Secondary | ICD-10-CM

## 2020-08-26 NOTE — Telephone Encounter (Signed)
[name] from [facility] called to let [name] know that the lab results have been faxed.  I got the lab results off of the fax and them on Brittany Roberts's desk.  [name] would like a call back at [phone].

## 2020-08-26 NOTE — Progress Notes (Unsigned)
She is on Coumadin 2.5 mg daily, started a week ago. Coumadin has been managed by her cardiologist. Plan: ***

## 2020-08-26 NOTE — Telephone Encounter (Addendum)
Hospice nurse, [name], called in VP INR from 08/25/20 of 1.9. Pt started on warfarin 2.5 daily since 08/21/20. Bridged with continued home Eliquis 2.5 mg BID untill 08/23/20. Nurse was unable to get POC fingerstick yesterday and needed to send in venipuncture lab draw  This was resulted this morning. Pt recently started on Keflex for possible concerns of UTI. Concerns of possible enhanced anticoagulation effect between cephalosporins and warfarin.   Paroxysmal Afib: CHA2DS2CASc score 6, annual stoke risk 9% Goal: 2.0-3.0 Indication: Paroxysmal AFib Today's INR: 1.9 Current dose: 2.5 mg daily  New dose: Same Next INR check: 4 days

## 2020-08-26 NOTE — Telephone Encounter (Signed)
Daughter, [name], called saying that she just had a televisit with Dr. [name] and discussed any recent INR result. Daughter was told to give pt 3.75 mg today and then continue 2.5 mg daily. Keflex stopped per Dr. Doug Sou recommendation. Ativan dose increased.   Will continue with plan to recheck INR on Friday.

## 2020-08-26 NOTE — Telephone Encounter (Signed)
I spoke with [name]. I went over all the med changes with her that Dr. [name] made during her phone visit with pt and pt's daughter.

## 2020-08-26 NOTE — Progress Notes (Signed)
Virtual Visit via Telephone Note  I connected with Brittany Roberts on 08/26/20 at  3:00 PM EST by telephone and verified that I am speaking with the correct person using two identifiers.   I discussed the limitations, risks, security and privacy concerns of performing an evaluation and management service by telephone and the availability of in person appointments. I also discussed with the patient that there may be a patient responsible charge related to this service. The patient expressed understanding and agreed to proceed.  Location patient: home Location provider: work office Participants present for the call: patient,daughter, provider Patient did not have a visit in the prior 7 days to address this/these issue(s).   History of Present Illness: Brittany Roberts is a 80 yo female with hx of DM 2, hypertension, atrial fib, CHF, CKD IV, CAD,and on hospice care having some episodes of agitation at night. Recently treated for UTI. I called because hospice nurse has been concerned about MS changes, labs were done on 08/25/20. I spoke with pt and daughter,Brittany Roberts. She is not sleeping at night but doing so through the day. Her daughter thinks Cephalexin is causing "hallucinations", she does not elaborate. She is not longer having dysuria or urinary frequency. She is in a good spirit today, asking if she can have ice cream. WBC 12.2 (3.4-10.8) H/H9.1/28.4. Neutrophils (absolute) 9.9 (1.4-7.0). Ammonia level normal at 94.  She is not in any pain. She is taking lorazepam 0.5 mg daily at night and if needed during the day.  It does not seem to be helping her with sleep.  Last night she started albuterol neb treatments, which has helped with cough and dyspnea.  Her daughter noticed that she was not short of breath when she was walking to the bathroom today.  She has not had fever or chills. She has good appetite. Negative for sore throat,CP,abdominal pain,or changes in bowel habits. INR 1.9. She is on  Coumadin 2.5 mg daily.  The Eliquis was discontinued about a week ago and started on Coumadin. Coumadin has been managed by her cardiologist.   Observations/Objective: Patient sounds cheerful and well on the phone. I do not appreciate any SOB. Speech and thought processing are grossly intact. Patient reported vitals:  Assessment and Plan: 1. Insomnia, unspecified type Sleeping during the day may be aggravating problem. Good sleep hygiene is important.  Lorazepam increased from 0.5 mg to 1 mg at bedtime. She still has lorazepam 0.5 mg: 15 tablets. Her daughter will let us know when she needs refills.  2. Hospice care patient We discussed goals. She is not on pain , so we will hold on Hydrocodone-Acetaminophen. She can have ice cream, advised not to have it daily. Family is with her monitoring for changes.  3. Chronic anticoagulation INR subtherapeutic. Recommend increasing dose of Coumadin tonight from 1tablet to 1.5 tablets, continue 1 tablet daily. INR in a week. I will send message to her cardiologist.  4. Urinary tract infection without hematuria, site unspecified Symptoms have resolved. Recommend discontinuing cephalexin.  CBC with mild abnormalities, she does not seem to be having an active infectious process at this time.  Monitor for new symptoms.  Follow Up Instructions: Return if symptoms worsen or fail to improve.   I did not refer this patient for an OV in the next 24 hours for this/these issue(s).  I discussed the assessment and treatment plan with the patient. Brittany Roberts and her daughter were provided an opportunity to ask questions and all were answered. They  agreed with the plan and demonstrated an understanding of the instructions.  I provided 19 minutes of non-face-to-face time during this encounter.   Raife Lizer [name], MD

## 2020-08-28 ENCOUNTER — Telehealth: Payer: Self-pay | Admitting: Family Medicine

## 2020-08-28 NOTE — Telephone Encounter (Signed)
Patient's daughter [name] is requesting that paperwork be filled out and faxed.  Paperwork has been placed in Dr. Doug Sou folder.

## 2020-08-29 ENCOUNTER — Other Ambulatory Visit: Payer: Self-pay | Admitting: Orthopedic Surgery

## 2020-08-29 DIAGNOSIS — R079 Chest pain, unspecified: Secondary | ICD-10-CM

## 2020-08-29 NOTE — Telephone Encounter (Signed)
Hospice nurse, [name], called stating that after discussing with the pt and family members, pt is agreeable with discontinuing warfarin since pt is in hospice. In order to reduce the inconvenience of repeat finger sticks and/or VP lab draws, and the risk of major/minor bleeding episodes, okay with discontinuing warfarin starting today. Reviewed with daughter about the risk and benefits and family agreeable to reduce the risk with discontinuing warfarin moving forward.

## 2020-08-29 NOTE — Telephone Encounter (Signed)
Paperwork completed, on pcp's desk to be signed.

## 2020-08-29 NOTE — Telephone Encounter (Signed)
Paperwork received.

## 2020-09-01 ENCOUNTER — Other Ambulatory Visit: Payer: Self-pay | Admitting: Family Medicine

## 2020-09-01 ENCOUNTER — Telehealth: Payer: Self-pay | Admitting: Family Medicine

## 2020-09-01 DIAGNOSIS — F419 Anxiety disorder, unspecified: Secondary | ICD-10-CM

## 2020-09-01 NOTE — Telephone Encounter (Signed)
Form faxed & copy sent to scan. 

## 2020-09-01 NOTE — Telephone Encounter (Signed)
I spoke with Brittany Roberts. She will let the family know to give her the medication tid and see how that works. Pt is not having any pain, she is confused. She did have hallucinations with the antibiotic, but that has been stopped. Hospice is trying to get pt moved to inpatient to see what is causing the confusion, but they currently don't have any beds.

## 2020-09-01 NOTE — Telephone Encounter (Signed)
Lovey Newcomer RN is calling and stated that patient has had increased agittation and no sleeping. Nurse wanted wanted to see if provider would consider a different medication, please advise. CB is [phone]

## 2020-09-01 NOTE — Telephone Encounter (Signed)
Is she having any pain/symptom/hallucinations? We can increase dose of Lorazepam to 1 mg tid and see if this helps with agitation and anxiety. Thanks, BJ

## 2020-09-02 ENCOUNTER — Inpatient Hospital Stay (HOSPITAL_COMMUNITY)
Admission: EM | Admit: 2020-09-02 | Discharge: 2020-09-04 | DRG: 813 | Disposition: A | Discharge status: Hospice | Attending: Internal Medicine | Admitting: Internal Medicine

## 2020-09-02 ENCOUNTER — Encounter (HOSPITAL_COMMUNITY): Payer: Self-pay | Admitting: Internal Medicine

## 2020-09-02 ENCOUNTER — Emergency Department (HOSPITAL_COMMUNITY)

## 2020-09-02 DIAGNOSIS — Z7901 Long term (current) use of anticoagulants: Secondary | ICD-10-CM

## 2020-09-02 DIAGNOSIS — E039 Hypothyroidism, unspecified: Secondary | ICD-10-CM | POA: Diagnosis present

## 2020-09-02 DIAGNOSIS — R918 Other nonspecific abnormal finding of lung field: Secondary | ICD-10-CM | POA: Diagnosis not present

## 2020-09-02 DIAGNOSIS — F0390 Unspecified dementia without behavioral disturbance: Secondary | ICD-10-CM | POA: Diagnosis present

## 2020-09-02 DIAGNOSIS — Z882 Allergy status to sulfonamides status: Secondary | ICD-10-CM | POA: Diagnosis not present

## 2020-09-02 DIAGNOSIS — R0902 Hypoxemia: Secondary | ICD-10-CM | POA: Diagnosis not present

## 2020-09-02 DIAGNOSIS — R1310 Dysphagia, unspecified: Secondary | ICD-10-CM | POA: Diagnosis present

## 2020-09-02 DIAGNOSIS — Z515 Encounter for palliative care: Secondary | ICD-10-CM | POA: Diagnosis not present

## 2020-09-02 DIAGNOSIS — J9611 Chronic respiratory failure with hypoxia: Secondary | ICD-10-CM | POA: Diagnosis not present

## 2020-09-02 DIAGNOSIS — Z66 Do not resuscitate: Secondary | ICD-10-CM | POA: Diagnosis present

## 2020-09-02 DIAGNOSIS — I2510 Atherosclerotic heart disease of native coronary artery without angina pectoris: Secondary | ICD-10-CM | POA: Diagnosis present

## 2020-09-02 DIAGNOSIS — Z794 Long term (current) use of insulin: Secondary | ICD-10-CM

## 2020-09-02 DIAGNOSIS — G9341 Metabolic encephalopathy: Secondary | ICD-10-CM | POA: Diagnosis not present

## 2020-09-02 DIAGNOSIS — K921 Melena: Secondary | ICD-10-CM | POA: Diagnosis present

## 2020-09-02 DIAGNOSIS — J690 Pneumonitis due to inhalation of food and vomit: Secondary | ICD-10-CM | POA: Diagnosis not present

## 2020-09-02 DIAGNOSIS — J90 Pleural effusion, not elsewhere classified: Secondary | ICD-10-CM | POA: Diagnosis not present

## 2020-09-02 DIAGNOSIS — Z9981 Dependence on supplemental oxygen: Secondary | ICD-10-CM

## 2020-09-02 DIAGNOSIS — Z833 Family history of diabetes mellitus: Secondary | ICD-10-CM

## 2020-09-02 DIAGNOSIS — M109 Gout, unspecified: Secondary | ICD-10-CM | POA: Diagnosis present

## 2020-09-02 DIAGNOSIS — I509 Heart failure, unspecified: Secondary | ICD-10-CM | POA: Diagnosis present

## 2020-09-02 DIAGNOSIS — Z880 Allergy status to penicillin: Secondary | ICD-10-CM

## 2020-09-02 DIAGNOSIS — R404 Transient alteration of awareness: Secondary | ICD-10-CM | POA: Diagnosis not present

## 2020-09-02 DIAGNOSIS — T45515A Adverse effect of anticoagulants, initial encounter: Secondary | ICD-10-CM | POA: Diagnosis not present

## 2020-09-02 DIAGNOSIS — F319 Bipolar disorder, unspecified: Secondary | ICD-10-CM | POA: Diagnosis present

## 2020-09-02 DIAGNOSIS — E1165 Type 2 diabetes mellitus with hyperglycemia: Secondary | ICD-10-CM | POA: Diagnosis not present

## 2020-09-02 DIAGNOSIS — Z8249 Family history of ischemic heart disease and other diseases of the circulatory system: Secondary | ICD-10-CM

## 2020-09-02 DIAGNOSIS — I513 Intracardiac thrombosis, not elsewhere classified: Secondary | ICD-10-CM | POA: Diagnosis not present

## 2020-09-02 DIAGNOSIS — I480 Paroxysmal atrial fibrillation: Secondary | ICD-10-CM | POA: Diagnosis present

## 2020-09-02 DIAGNOSIS — Z825 Family history of asthma and other chronic lower respiratory diseases: Secondary | ICD-10-CM

## 2020-09-02 DIAGNOSIS — R627 Adult failure to thrive: Secondary | ICD-10-CM | POA: Diagnosis present

## 2020-09-02 DIAGNOSIS — D573 Sickle-cell trait: Secondary | ICD-10-CM | POA: Diagnosis present

## 2020-09-02 DIAGNOSIS — Z79899 Other long term (current) drug therapy: Secondary | ICD-10-CM

## 2020-09-02 DIAGNOSIS — D6832 Hemorrhagic disorder due to extrinsic circulating anticoagulants: Principal | ICD-10-CM | POA: Diagnosis present

## 2020-09-02 DIAGNOSIS — E1122 Type 2 diabetes mellitus with diabetic chronic kidney disease: Secondary | ICD-10-CM | POA: Diagnosis not present

## 2020-09-02 DIAGNOSIS — R000 Tachycardia, unspecified: Secondary | ICD-10-CM | POA: Diagnosis not present

## 2020-09-02 DIAGNOSIS — E1140 Type 2 diabetes mellitus with diabetic neuropathy, unspecified: Secondary | ICD-10-CM | POA: Diagnosis present

## 2020-09-02 DIAGNOSIS — Z743 Need for continuous supervision: Secondary | ICD-10-CM | POA: Diagnosis not present

## 2020-09-02 DIAGNOSIS — K219 Gastro-esophageal reflux disease without esophagitis: Secondary | ICD-10-CM | POA: Diagnosis present

## 2020-09-02 DIAGNOSIS — J189 Pneumonia, unspecified organism: Secondary | ICD-10-CM

## 2020-09-02 DIAGNOSIS — I517 Cardiomegaly: Secondary | ICD-10-CM | POA: Diagnosis not present

## 2020-09-02 DIAGNOSIS — I130 Hypertensive heart and chronic kidney disease with heart failure and stage 1 through stage 4 chronic kidney disease, or unspecified chronic kidney disease: Secondary | ICD-10-CM | POA: Diagnosis present

## 2020-09-02 DIAGNOSIS — D649 Anemia, unspecified: Secondary | ICD-10-CM | POA: Diagnosis not present

## 2020-09-02 DIAGNOSIS — K922 Gastrointestinal hemorrhage, unspecified: Secondary | ICD-10-CM | POA: Diagnosis present

## 2020-09-02 DIAGNOSIS — Z6841 Body Mass Index (BMI) 40.0 and over, adult: Secondary | ICD-10-CM | POA: Diagnosis not present

## 2020-09-02 DIAGNOSIS — E1169 Type 2 diabetes mellitus with other specified complication: Secondary | ICD-10-CM | POA: Diagnosis present

## 2020-09-02 DIAGNOSIS — Z7189 Other specified counseling: Secondary | ICD-10-CM | POA: Diagnosis not present

## 2020-09-02 DIAGNOSIS — Z885 Allergy status to narcotic agent status: Secondary | ICD-10-CM

## 2020-09-02 DIAGNOSIS — J811 Chronic pulmonary edema: Secondary | ICD-10-CM | POA: Diagnosis not present

## 2020-09-02 DIAGNOSIS — Z8616 Personal history of COVID-19: Secondary | ICD-10-CM

## 2020-09-02 DIAGNOSIS — Z8261 Family history of arthritis: Secondary | ICD-10-CM

## 2020-09-02 DIAGNOSIS — Z841 Family history of disorders of kidney and ureter: Secondary | ICD-10-CM

## 2020-09-02 DIAGNOSIS — Z832 Family history of diseases of the blood and blood-forming organs and certain disorders involving the immune mechanism: Secondary | ICD-10-CM

## 2020-09-02 DIAGNOSIS — F419 Anxiety disorder, unspecified: Secondary | ICD-10-CM | POA: Diagnosis present

## 2020-09-02 DIAGNOSIS — R791 Abnormal coagulation profile: Secondary | ICD-10-CM

## 2020-09-02 DIAGNOSIS — M1990 Unspecified osteoarthritis, unspecified site: Secondary | ICD-10-CM | POA: Diagnosis present

## 2020-09-02 DIAGNOSIS — E1149 Type 2 diabetes mellitus with other diabetic neurological complication: Secondary | ICD-10-CM | POA: Diagnosis present

## 2020-09-02 DIAGNOSIS — M329 Systemic lupus erythematosus, unspecified: Secondary | ICD-10-CM | POA: Diagnosis present

## 2020-09-02 DIAGNOSIS — Z20822 Contact with and (suspected) exposure to covid-19: Secondary | ICD-10-CM | POA: Diagnosis not present

## 2020-09-02 DIAGNOSIS — Z87891 Personal history of nicotine dependence: Secondary | ICD-10-CM

## 2020-09-02 DIAGNOSIS — E1142 Type 2 diabetes mellitus with diabetic polyneuropathy: Secondary | ICD-10-CM | POA: Diagnosis present

## 2020-09-02 DIAGNOSIS — I499 Cardiac arrhythmia, unspecified: Secondary | ICD-10-CM | POA: Diagnosis not present

## 2020-09-02 DIAGNOSIS — Z888 Allergy status to other drugs, medicaments and biological substances status: Secondary | ICD-10-CM | POA: Diagnosis not present

## 2020-09-02 DIAGNOSIS — G4733 Obstructive sleep apnea (adult) (pediatric): Secondary | ICD-10-CM | POA: Diagnosis present

## 2020-09-02 DIAGNOSIS — N184 Chronic kidney disease, stage 4 (severe): Secondary | ICD-10-CM | POA: Diagnosis not present

## 2020-09-02 DIAGNOSIS — Z7951 Long term (current) use of inhaled steroids: Secondary | ICD-10-CM

## 2020-09-02 DIAGNOSIS — M797 Fibromyalgia: Secondary | ICD-10-CM | POA: Diagnosis present

## 2020-09-02 DIAGNOSIS — R531 Weakness: Secondary | ICD-10-CM

## 2020-09-02 DIAGNOSIS — Z955 Presence of coronary angioplasty implant and graft: Secondary | ICD-10-CM

## 2020-09-02 DIAGNOSIS — E785 Hyperlipidemia, unspecified: Secondary | ICD-10-CM | POA: Diagnosis present

## 2020-09-02 LAB — CBC WITH DIFFERENTIAL/PLATELET
Abs Immature Granulocytes: 0.07 10*3/uL (ref 0.00–0.07)
Basophils Absolute: 0.1 10*3/uL (ref 0.0–0.1)
Basophils Relative: 0 %
Eosinophils Absolute: 0.4 10*3/uL (ref 0.0–0.5)
Eosinophils Relative: 3 %
HCT: 16 % — ABNORMAL LOW (ref 36.0–46.0)
Hemoglobin: 5.3 g/dL — CL (ref 12.0–15.0)
Immature Granulocytes: 1 %
Lymphocytes Relative: 15 %
Lymphs Abs: 2.1 10*3/uL (ref 0.7–4.0)
MCH: 26.2 pg (ref 26.0–34.0)
MCHC: 33.1 g/dL (ref 30.0–36.0)
MCV: 79.2 fL — ABNORMAL LOW (ref 80.0–100.0)
Monocytes Absolute: 1 10*3/uL (ref 0.1–1.0)
Monocytes Relative: 7 %
Neutro Abs: 11 10*3/uL — ABNORMAL HIGH (ref 1.7–7.7)
Neutrophils Relative %: 74 %
Platelets: 238 10*3/uL (ref 150–400)
RBC: 2.02 MIL/uL — ABNORMAL LOW (ref 3.87–5.11)
RDW: 15.1 % (ref 11.5–15.5)
WBC: 14.6 10*3/uL — ABNORMAL HIGH (ref 4.0–10.5)
nRBC: 0 % (ref 0.0–0.2)

## 2020-09-02 LAB — COMPREHENSIVE METABOLIC PANEL
ALT: 21 U/L (ref 0–44)
AST: 35 U/L (ref 15–41)
Albumin: 2.9 g/dL — ABNORMAL LOW (ref 3.5–5.0)
Alkaline Phosphatase: 97 U/L (ref 38–126)
Anion gap: 9 (ref 5–15)
BUN: 54 mg/dL — ABNORMAL HIGH (ref 8–23)
CO2: 36 mmol/L — ABNORMAL HIGH (ref 22–32)
Calcium: 10.3 mg/dL (ref 8.9–10.3)
Chloride: 95 mmol/L — ABNORMAL LOW (ref 98–111)
Creatinine, Ser: 2.04 mg/dL — ABNORMAL HIGH (ref 0.44–1.00)
GFR, Estimated: 24 mL/min — ABNORMAL LOW (ref >60)
Glucose, Bld: 239 mg/dL — ABNORMAL HIGH (ref 70–99)
Potassium: 3.7 mmol/L (ref 3.5–5.1)
Sodium: 140 mmol/L (ref 135–145)
Total Bilirubin: 0.7 mg/dL (ref 0.3–1.2)
Total Protein: 7.1 g/dL (ref 6.5–8.1)

## 2020-09-02 LAB — URINALYSIS, COMPLETE (UACMP) WITH MICROSCOPIC
Bilirubin Urine: NEGATIVE
Glucose, UA: NEGATIVE mg/dL
Hgb urine dipstick: NEGATIVE
Ketones, ur: NEGATIVE mg/dL
Leukocytes,Ua: NEGATIVE
Nitrite: NEGATIVE
Protein, ur: NEGATIVE mg/dL
Specific Gravity, Urine: 1.009 (ref 1.005–1.030)
pH: 5 (ref 5.0–8.0)

## 2020-09-02 LAB — TYPE AND SCREEN
ABO/RH(D): O POS
Antibody Screen: NEGATIVE

## 2020-09-02 LAB — ABO/RH: ABO/RH(D): O POS

## 2020-09-02 LAB — LACTIC ACID, PLASMA
Lactic Acid, Venous: 1.1 mmol/L (ref 0.5–1.9)
Lactic Acid, Venous: 0.9 mmol/L (ref 0.5–1.9)

## 2020-09-02 LAB — RAPID URINE DRUG SCREEN, HOSP PERFORMED
Amphetamines: NOT DETECTED
Barbiturates: NOT DETECTED
Benzodiazepines: NOT DETECTED
Cocaine: NOT DETECTED
Opiates: NOT DETECTED
Tetrahydrocannabinol: NOT DETECTED

## 2020-09-02 LAB — PROTIME-INR
INR: 6.2 (ref 0.8–1.2)
Prothrombin Time: 52.9 seconds — ABNORMAL HIGH (ref 11.4–15.2)

## 2020-09-02 LAB — ETHANOL: Alcohol, Ethyl (B): <10 mg/dL (ref <10)

## 2020-09-02 LAB — CBG MONITORING, ED
Glucose-Capillary: 264 mg/dL — ABNORMAL HIGH (ref 70–99)
Glucose-Capillary: 241 mg/dL — ABNORMAL HIGH (ref 70–99)

## 2020-09-02 LAB — POC OCCULT BLOOD, ED: Fecal Occult Bld: POSITIVE — AB

## 2020-09-02 LAB — LIPASE, BLOOD: Lipase: 35 U/L (ref 11–51)

## 2020-09-02 LAB — AMMONIA: Ammonia: 24 umol/L (ref 9–35)

## 2020-09-02 LAB — BRAIN NATRIURETIC PEPTIDE: B Natriuretic Peptide: 541.9 pg/mL — ABNORMAL HIGH (ref 0.0–100.0)

## 2020-09-02 MED ORDER — ACETAMINOPHEN 650 MG RE SUPP: 650.0000 mg | Freq: Four times a day (QID) | RECTAL | Status: DC | PRN

## 2020-09-02 MED ORDER — SODIUM CHLORIDE 0.9 % IV SOLN
500.0000 mg | Freq: Once | INTRAVENOUS | Status: AC
  Administered 2020-09-02: 500 mg via INTRAVENOUS
  Filled 2020-09-02: qty 500

## 2020-09-02 MED ORDER — VITAMIN K1 10 MG/ML IJ SOLN
10.0000 mg | Freq: Once | INTRAVENOUS | Status: AC
  Administered 2020-09-02: 10 mg via INTRAVENOUS
  Filled 2020-09-02: qty 1

## 2020-09-02 MED ORDER — SODIUM CHLORIDE 0.9 % IV SOLN
1.0000 g | Freq: Once | INTRAVENOUS | Status: AC
  Administered 2020-09-02: 1 g via INTRAVENOUS
  Filled 2020-09-02: qty 10

## 2020-09-02 MED ORDER — SODIUM CHLORIDE 0.9 % IV SOLN
125.0000 mg | INTRAVENOUS | Status: AC
  Administered 2020-09-03: 125 mg via INTRAVENOUS
  Filled 2020-09-02: qty 10

## 2020-09-02 MED ORDER — PROTHROMBIN COMPLEX CONC HUMAN 500 UNITS IV KIT
1580.0000 [IU] | PACK | Status: DC
  Filled 2020-09-02: qty 1580

## 2020-09-02 MED ORDER — INSULIN ASPART 100 UNIT/ML ~~LOC~~ SOLN
0.0000 [IU] | SUBCUTANEOUS | Status: DC
  Administered 2020-09-02 – 2020-09-03 (×3): 2 [IU] via SUBCUTANEOUS
  Administered 2020-09-03: 1 [IU] via SUBCUTANEOUS

## 2020-09-02 MED ORDER — ACETAMINOPHEN 325 MG PO TABS: 650.0000 mg | ORAL_TABLET | Freq: Four times a day (QID) | ORAL | Status: DC | PRN

## 2020-09-02 MED ORDER — PANTOPRAZOLE SODIUM 40 MG IV SOLR
40.0000 mg | Freq: Once | INTRAVENOUS | Status: AC
  Administered 2020-09-02: 40 mg via INTRAVENOUS
  Filled 2020-09-02: qty 40

## 2020-09-02 NOTE — ED Triage Notes (Signed)
Pt bib [facility] after family called for altered mental status starting a couple weeks ago that progessivley worsened today. Hx of dementia and frequent UTI's. Hx of heart failure - on 3L chronic o2. Negative stroke screen.   CBG: 353

## 2020-09-02 NOTE — ED Provider Notes (Signed)
[facility] Provider Note   CSN: [id number] Arrival date & time: 09/02/20  1240     History Chief Complaint  Patient presents with  . Altered Mental Status   LEVEL 5 CAVEAT - DEMENTIA  Brittany Roberts is a 80 y.o. female with PMHx HTN, Hypothyroidism, Lupus, Type II diabetes, bipolar disorder, fibromyalgia, CHF on chronic home O2 s/p COVID infection last year who presents to the ED today with worsening altered mental status for the past 3 weeks.  Per family they report that patient began being more agitated approximately 3 weeks ago with decline in health.  They checked her urine with home health care which came back positive for UTI and she was initially treated with what family believes to be Bactrim.  They report that the culture returned and showed that Bactrim was not effective and she was changed to what family believes is Keflex.  Therefore she was on it for approximately 5 days and finished 4 days ago. They state that patient seemed to becoming more agitated on Keflex however and since finishing it has continued to decline. Family mentions that patient had similar symptoms several months ago when she was admitted for hepatic encephalopathy.  Patient is not currently on any lactulose or other medication to decrease ammonia level.  They do mention that patient is also declining some abdominal pain.  She is chronically on 4 L, has not had to increase it.  Denies any cough or recent sick contacts.  The history is provided by the patient, a relative, medical records and the EMS personnel.       Past Medical History:  Diagnosis Date  . Acute delirium 01/14/2017  . Anxiety   . Arthritis    "all over"  . Asthma   . Bipolar disorder ([place])   . Complication of anesthesia    "w/right foot OR they gave me too much and they couldn't get me woke"  . Congestive heart failure, unspecified   . Coronary artery calcification seen on CAT scan 01/14/2017  .  Coronary artery disease    "I've got 1 stent" (06/30/2015)  . Depression   . Discoid lupus   . Fibromyalgia    "I've been told I have this; can't take Lyrica cause I'm allergic to it" (06/30/2015)  . GERD (gastroesophageal reflux disease)   . History of gout   . History of hiatal hernia    "real bad" (06/30/2015)  . Hypertension   . Hypothyroidism   . Memory loss   . On home oxygen therapy    "2L q hs" (06/30/2015)  . OSA (obstructive sleep apnea)    "just use my oxygen; no mask" (06/30/2015)  . Other and unspecified hyperlipidemia   . Penetrating foot wound    left nonhealing foot wound on the dorsal surface  . Peripheral neuropathy   . Pneumonia "many of times"  . Refusal of blood transfusions as patient is Jehovah's Witness   . Renal failure, unspecified    "kidneys not working 100%" (06/30/2015)  . Seizure ([place])   . Sickle cell trait ([place])   . Systemic lupus ([place])   . Thoracic aortic atherosclerosis ([place]) 01/14/2017  . Type II diabetes mellitus ([place])   . Unspecified vitamin D deficiency     Patient Active Problem List   Diagnosis Date Noted  . Anxiety disorder 07/31/2020  . Paroxysmal atrial fibrillation ([place]) 06/27/2020  . Dementia ([place]) 06/27/2020  . Acute encephalopathy 06/23/2020  . CAP (community  acquired pneumonia) 06/23/2020  . Lung mass 06/23/2020  . Atrial fibrillation with RVR ([place]) 06/10/2020  . AMS (altered mental status) 06/07/2020  . Hypomagnesemia 02/20/2020  . Chronic respiratory failure with hypoxia, on home O2 therapy ([place]) 02/20/2020  . Monitoring for long-term anticoagulant use 12/17/2019  . LV (left ventricular) mural thrombus 12/11/2019  . Acute on chronic diastolic CHF (congestive heart failure) ([place]) 12/06/2019  . Pneumonia due to COVID-19 virus 11/01/2019  . Respiratory failure ([place]) 11/01/2019  . Memory loss 04/05/2019  . Cerebrovascular accident (CVA) ([place]) 04/05/2019  . Seizures ([place]) 04/05/2019  . Obstructive sleep apnea 04/05/2019   . Chronic heart failure with preserved ejection fraction ([place]) 01/17/2019  . OP (osteoporosis) 01/01/2019  . Abdominal wall pain in right flank 08/13/2018  . Alopecia, scarring 08/11/2018  . Chronic right shoulder pain 03/15/2018  . Bilateral primary osteoarthritis of knee 02/09/2018  . Pain in right ankle and joints of right foot 02/09/2018  . Acute exacerbation of congestive heart failure ([place]) 12/13/2017  . Chronic kidney disease (CKD), stage IV (severe) ([place]) 09/01/2017  . Gout, arthropathy 09/01/2017  . Impacted cerumen of both ears 05/13/2017  . Sensorineural hearing loss, bilateral 05/13/2017  . Localization-related symptomatic epilepsy and epileptic syndromes with complex partial seizures, not intractable, without status epilepticus ([place]) 04/25/2017  . Fibromyalgia 04/08/2017  . Allergic rhinitis 03/11/2017  . Cardiomyopathy ([place]) 01/22/2017  . Hyperlipidemia associated with type 2 diabetes mellitus ([place]) 01/22/2017  . Bipolar disorder ([place]) 01/22/2017  . Seizure ([place])   . Thoracic aortic atherosclerosis ([place]) 01/14/2017  . Coronary artery calcification seen on CAT scan 01/14/2017  . Acute delirium 01/14/2017  . Possible Tonic-clonic seizure disorder ([place]) 01/14/2017  . CVA (cerebral vascular accident) ([place]) 01/14/2017  . Altered mental status 01/13/2017  . Mastalgia 04/20/2016  . Bilateral leg edema 02/10/2016  . Difficulty hearing 10/30/2015  . Midline low back pain with right-sided sciatica 09/25/2015  . Carpal tunnel syndrome on left 08/14/2015  . Chest pain of uncertain etiology 34/74/2595  . Primary localized osteoarthrosis, hand 06/19/2015  . Radial styloid tenosynovitis 06/19/2015  . Dysarthria 02/14/2015  . Type 2 diabetes mellitus with complication ([place])   . Cephalalgia   . Hx of systemic lupus erythematosus (SLE) ([place])   . Thyroid mass 04/26/2014  . Constipation 12/10/2013  . Benign neoplasm of colon 12/10/2013  . Type 2 diabetes mellitus with diabetic  neuropathy, unspecified ([place]) 11/27/2013  . Renal cyst, right 11/09/2013  . Insulin dependent type 2 diabetes mellitus ([place]) 10/29/2013  . Hoarseness or changing voice 09/27/2013  . Dyskinesia, tardive 07/11/2013  . TMJ syndrome 02/16/2013  . Angina pectoris associated with type 2 diabetes mellitus ([place]) 08/08/2012  . Coronary artery disease of native artery of native heart with stable angina pectoris ([place]) 08/08/2012  . Asthma, chronic, PRESUMED wiht MULTIFACTORIAL DYSPNEA 05/30/2012  . Insomnia 09/22/2011  . Mobility poor 02/02/2011  . Chronic kidney disease, stage III (moderate) ([place]) 05/13/2010  . Chronic systolic (congestive) heart failure ([place]) 03/31/2010  . COPD (chronic obstructive pulmonary disease) ([place]) 03/26/2010  . Morbid obesity ([place]) 02/27/2010  . Unstable gait 02/27/2010  . DM (diabetes mellitus), type 2 with neurological complications ([place]) 63/87/5643  . Arthritis 04/25/2009  . Anemia 03/11/2007  . COGNITIVE IMPAIRMENT, MILD, SO STATED 03/11/2007  . RESTLESS LEG SYNDROME 03/11/2007  . Lupus ([place]) 03/11/2007  . OSTEOARTHROSIS, GENERALIZED, MULTIPLE SITES 03/11/2007  . Hypothyroidism 03/07/2007  . Hypercholesteremia 03/07/2007  . Depression 03/07/2007  . Hypertensive heart disease with heart failure ([place])  03/07/2007  . Coronary atherosclerosis 03/07/2007  . GERD 03/07/2007  . SLEEP APNEA 03/07/2007    Past Surgical History:  Procedure Laterality Date  . ANKLE ARTHROSCOPY WITH OPEN REDUCTION INTERNAL FIXATION (ORIF) Right 03/2011  . CARDIAC CATHETERIZATION N/A 07/01/2015   Procedure: Left Heart Cath and Coronary Angiography;  Surgeon: Adrian Prows, MD;  Location: [facility];  Service: Cardiovascular;  Laterality: N/A;  . CARDIAC CATHETERIZATION N/A 07/01/2015   Procedure: Intravascular Pressure Wire/FFR Study;  Surgeon: Adrian Prows, MD;  Location: [facility];  Service: Cardiovascular;  Laterality: N/A;  . CARPAL TUNNEL RELEASE Bilateral   . CATARACT  EXTRACTION W/ INTRAOCULAR LENS  IMPLANT, BILATERAL Bilateral   . CHOLECYSTECTOMY OPEN    . COLONOSCOPY N/A 12/10/2013   Procedure: COLONOSCOPY;  Surgeon: Inda Castle, MD;  Location: [facility];  Service: Endoscopy;  Laterality: N/A;  . FRACTURE SURGERY    . INCISION AND DRAINAGE ABSCESS Left    foot "under my little toe"  . KNEE LIGAMENT RECONSTRUCTION Right   . LEFT HEART CATHETERIZATION WITH CORONARY ANGIOGRAM N/A 08/08/2012   Procedure: LEFT HEART CATHETERIZATION WITH CORONARY ANGIOGRAM;  Surgeon: [name], MD;  Location: [facility] CATH LAB;  Service: Cardiovascular;  Laterality: N/A;  . NASAL SINUS SURGERY    . PERCUTANEOUS CORONARY STENT INTERVENTION (PCI-S) N/A 08/21/2012   Procedure: PERCUTANEOUS CORONARY STENT INTERVENTION (PCI-S);  Surgeon: [name], MD;  Location: [facility] CATH LAB;  Service: Cardiovascular;  Laterality: N/A;  . TUBAL LIGATION       OB History   No obstetric history on file.     Family History  Problem Relation Age of Onset  . Heart disease Mother   . Diabetes Mother   . Clotting disorder Mother   . Pneumonia Father   . Rheum arthritis Father   . Diabetes Sister   . Diabetes Sister   . Asthma Brother   . Cancer Brother   . Kidney disease Brother   . Lupus Son   . Heart disease Son   . Heart disease Daughter   . Colon cancer Neg Hx     Social History   Tobacco Use  . Smoking status: Former Smoker    Packs/day: 3.00    Years: 40.00    Pack years: 120.00    Types: Cigarettes    Start date: 07/05/1960    Quit date: 07/05/1998    Years since quitting: 22.1  . Smokeless tobacco: Never Used  Vaping Use  . Vaping Use: Never used  Substance Use Topics  . Alcohol use: No    Alcohol/week: 0.0 standard drinks    Comment: havent drank in over 40 years  . Drug use: No    Home Medications Prior to Admission medications   Medication Sig Start Date End Date Taking? Authorizing Provider  Accu-Chek Softclix Lancets lancets Use as instructed  07/24/20   Cleophas Dunker, Amy E, NP  acetaminophen (TYLENOL) 500 MG tablet Take 500 mg by mouth every 6 (six) hours as needed for moderate pain.    [provider]  albuterol (PROVENTIL) (2.5 MG/3ML) 0.083% nebulizer solution Take 3 mLs (2.5 mg total) by nebulization every 8 (eight) hours as needed for wheezing or shortness of breath. When not using albuterol inhaler. 08/22/20   [name], [name], MD  allopurinol (ZYLOPRIM) 100 MG tablet Take 2 tablets (200 mg total) by mouth daily. 07/23/20   Fargo, Amy E, NP  amLODipine (NORVASC) 2.5 MG tablet Take 1 tablet (2.5 mg  total) by mouth daily. 07/23/20   Fargo, Amy E, NP  Ascorbic Acid (VITAMIN C) 500 MG CAPS Take 1 capsule by mouth daily. 02/26/20   [name], [name], MD  atorvastatin (LIPITOR) 20 MG tablet TAKE 1 TABLET(20 MG) BY MOUTH DAILY 07/23/20   Fargo, Amy E, NP  B Complex Vitamins (VITAMIN B COMPLEX) TABS Take 1 tablet by mouth daily.    [provider]  Cholecalciferol (VITAMIN D3) 125 MCG (5000 UT) TABS Take 5,000 Units by mouth daily.    [provider]  DULoxetine (CYMBALTA) 60 MG capsule Take 1 capsule (60 mg total) by mouth daily. 07/23/20   Fargo, Amy E, NP  feeding supplement (ENSURE ENLIVE / ENSURE PLUS) LIQD Take 237 mLs by mouth 4 (four) times daily. 06/27/20   Debbe Odea, MD  Fluocinolone Acetonide Scalp 0.01 % OIL Apply 1 application topically at bedtime as needed (itching scalp).  07/30/19   [provider]  Fluticasone-Salmeterol (ADVAIR) 250-50 MCG/DOSE AEPB Inhale 1 puff into the lungs 2 (two) times daily. 07/23/20   Fargo, Amy E, NP  gabapentin (NEURONTIN) 100 MG capsule Take 1 capsule (100 mg total) by mouth at bedtime. 07/23/20   [place]argo, Amy E, NP  glucose blood (ACCU-CHEK GUIDE) test strip Use to test blood sugars 1-2 times daily. Dx: e11.9 07/24/20   Yvonna Alanis, NP  HUMULIN N KWIKPEN 100 UNIT/ML Kiwkpen Inject into the skin. 08/02/20   [provider]  insulin aspart (NOVOLOG) 100 UNIT/ML injection  Inject 0-9 Units into the skin 3 (three) times daily with meals. CBG 70 - 120: 0 units  CBG 121 - 150: 1 unit  CBG 151 - 200: 2 units  CBG 201 - 250: 3 units  CBG 251 - 300: 5 units  CBG 301 - 350: 7 units  CBG 351 - 400 9 units 07/23/20   Fargo, Amy E, NP  insulin glargine (LANTUS) 100 unit/mL SOPN Inject 10 Units into the skin at bedtime. 08/18/20   [name], [name], MD  Insulin Pen Needle 31G X 5 MM MISC 4 each by Does not apply route daily as needed (used to administer insulin AC & HS). 07/24/20   [place]argo, Amy E, NP  isosorbide mononitrate (IMDUR) 30 MG 24 hr tablet TAKE 1 TABLET(30 MG) BY MOUTH DAILY 07/23/20   Fargo, Amy E, NP  LORazepam (ATIVAN) 0.5 MG tablet Take 1 tablet (0.5 mg total) by mouth 2 (two) times daily as needed for anxiety. 07/30/20   [name], [name], MD  melatonin 5 MG TABS Take 5 mg by mouth.    [provider]  memantine (NAMENDA) 10 MG tablet Take 1 tablet (10 mg total) by mouth daily. 07/23/20   Fargo, Amy E, NP  metoprolol succinate (TOPROL-XL) 50 MG 24 hr tablet TAKE 1 TABLET BY MOUTH EVERY DAY. TAKE WITH OR IMMEDIATELY FOLLOWING A MEAL 07/23/20   [place]argo, Amy E, NP  montelukast (SINGULAIR) 10 MG tablet TAKE 1 TABLET(10 MG) BY MOUTH AT BEDTIME 07/23/20   Fargo, Amy E, NP  Omega-3 Fatty Acids (FISH OIL) 1000 MG CAPS Take 1,000 mg by mouth daily.     [provider]  pantoprazole (PROTONIX) 40 MG tablet TAKE 1 TABLET(40 MG) BY MOUTH DAILY 07/23/20   Fargo, Amy E, NP  Polyethyl Glycol-Propyl Glycol (SYSTANE) 0.4-0.3 % GEL ophthalmic gel Place 1 application into both eyes every 4 (four) hours as needed (dry eyes).    [provider]  PROAIR HFA 108 [phone] Base) MCG/ACT  inhaler INHALE 2 PUFF BY MOUTH EVERY 4 HOURS AS NEEDED USE ONLY IF YOU ARE WHEEZING 07/16/20   [place], Amy E, NP  torsemide (DEMADEX) 20 MG tablet TAKE 1 TABLET(20 MG) BY MOUTH TWICE DAILY 08/04/20   Patwardhan, Manish J, MD  warfarin (COUMADIN) 2.5 MG tablet Take 2.5 mg by mouth daily.    [provider]    Allergies    Ace inhibitors, Penicillins, Buspirone, Isosorb dinitrate-hydralazine, Pregabalin, Ropinirole hydrochloride, and Amantadines  Review of Systems   Review of Systems  Unable to perform ROS: Dementia  Constitutional: Negative for fever.  Psychiatric/Behavioral: Positive for agitation and confusion.    Physical Exam Updated Vital Signs BP 135/82   Pulse 62   Temp 98.4 F (36.9 C)   Resp (!) 24   Ht 5\' 3"  (1.6 m)   Wt 127.1 kg   SpO2 100%   BMI 49.64 kg/m   Physical Exam Vitals and nursing note reviewed.  Constitutional:      Appearance: She is not ill-appearing.     Comments: Pleasantly demented  HENT:     Head: Normocephalic and atraumatic.  Eyes:     Extraocular Movements: Extraocular movements intact.     Conjunctiva/sclera: Conjunctivae normal.     Pupils: Pupils are equal, round, and reactive to light.  Cardiovascular:     Rate and Rhythm: Normal rate and regular rhythm.     Pulses: Normal pulses.  Pulmonary:     Effort: Pulmonary effort is normal.     Breath sounds: Normal breath sounds. No wheezing, rhonchi or rales.     Comments: On chronic 4L O2 Abdominal:     Palpations: Abdomen is soft.     Tenderness: There is no abdominal tenderness. There is no guarding or rebound.  Genitourinary:    Rectum: Guaiac result positive.     Comments: Chaperone present for GU exam. Very small 1 cm type I pressure ulcer to left buttocks. No external hemorrhoids appreciated. Black colored stool on gloved finger, guaiac positive.  Musculoskeletal:     Cervical back: Neck supple.  Skin:    General: Skin is warm and dry.  Neurological:     Mental Status: She is alert. Mental status is at baseline.     Comments: Following all commands without difficulty. No facial droop appreciated. Strength equal to BUE and BLEs.      ED Results / Procedures / Treatments   Labs (all labs ordered are listed, but only abnormal results are displayed) Labs  Reviewed  COMPREHENSIVE METABOLIC PANEL - Abnormal; Notable for the following components:      Result Value   Chloride 95 (*)    CO2 36 (*)    Glucose, Bld 239 (*)    BUN 54 (*)    Creatinine, Ser 2.04 (*)    Albumin 2.9 (*)    GFR, Estimated 24 (*)    All other components within normal limits  CBC WITH DIFFERENTIAL/PLATELET - Abnormal; Notable for the following components:   WBC 14.6 (*)    RBC 2.02 (*)    Hemoglobin 5.3 (*)    HCT 16.0 (*)    MCV 79.2 (*)    Neutro Abs 11.0 (*)    All other components within normal limits  PROTIME-INR - Abnormal; Notable for the following components:   Prothrombin Time 52.9 (*)    INR 6.2 (*)    All other components within normal limits  BRAIN NATRIURETIC PEPTIDE - Abnormal; Notable for the following components:  B Natriuretic Peptide 541.9 (*)    All other components within normal limits  CBG MONITORING, ED - Abnormal; Notable for the following components:   Glucose-Capillary 264 (*)    All other components within normal limits  POC OCCULT BLOOD, ED - Abnormal; Notable for the following components:   Fecal Occult Bld POSITIVE (*)    All other components within normal limits  URINE CULTURE  SARS CORONAVIRUS 2 (TAT 6-24 HRS)  ETHANOL  AMMONIA  LIPASE, BLOOD  LACTIC ACID, PLASMA  URINALYSIS, COMPLETE (UACMP) WITH MICROSCOPIC  RAPID URINE DRUG SCREEN, HOSP PERFORMED  LACTIC ACID, PLASMA  TYPE AND SCREEN    EKG EKG Interpretation  Date/Time:  Tuesday September 02 2020 12:43:09 EST Ventricular Rate:  142 PR Interval:    QRS Duration: 163 QT Interval:  377 QTC Calculation: 489 R Axis:   -66 Text Interpretation: Extreme tachycardia with wide complex, no further rhythm analysis attempted Artifact in lead(s) I III aVR aVL artifact, unable to evaluate Confirmed by Lavenia Atlas [phone]) on 09/02/2020 1:19:11 PM   Radiology CT HEAD WO CONTRAST  Result Date: 09/02/2020 CLINICAL DATA:  Mental status changes. EXAM: CT HEAD WITHOUT CONTRAST  TECHNIQUE: Contiguous axial images were obtained from the base of the skull through the vertex without intravenous contrast. COMPARISON:  CT head of 06/22/2020 and MRI brain from 06/23/2020 FINDINGS: Brain: Chronic right frontal MCA distribution infarct noted, unchanged distribution of encephalomalacia from 06/22/2020. Otherwise, the brainstem, cerebellum, cerebral peduncles, thalamus, basal ganglia, basilar cisterns, and ventricular system appear within normal limits. No intracranial hemorrhage, mass lesion, or acute CVA. Vascular: There is atherosclerotic calcification of the cavernous carotid arteries bilaterally. Skull: No fracture or acute bony findings. Borderline thickened calvarium. Sinuses/Orbits: Chronic deformity of the right maxillary sinus with adjacent bony expansion and postoperative findings. Chronic sclerosis in the left mandibular condyle with associated superior surface flattening or erosion. Other: No supplemental non-categorized findings. IMPRESSION: 1. No acute intracranial findings. 2. Chronic right frontal MCA distribution infarct, unchanged distribution of encephalomalacia from 06/22/2020. 3. Chronic deformity of the right maxillary sinus with adjacent bony expansion and likely underlying postoperative findings. 4. Chronic sclerosis in the left mandibular condyle with associated superior surface flattening or erosion. Electronically Signed   By: Van Clines M.D.   On: 09/02/2020 15:15   DG Chest Port 1 View  Result Date: 09/02/2020 CLINICAL DATA:  Rule out infection. EXAM: PORTABLE CHEST 1 VIEW COMPARISON:  CT chest June 23, 2020. Chest radiographs July 28, 2020. FINDINGS: Right hilar/suprahilar masslike prominence, better characterized on recent CT chest from 06/23/2020. Enlarged cardiac silhouette. Aortic atherosclerosis. Poorly characterized left basilar opacities. Suspected small left pleural effusion. No visible pneumothorax. IMPRESSION: 1. Left basilar opacities which  are poorly characterized on this portable radiograph, possibly atelectasis, aspiration, and/or pneumonia. Dedicated PA and lateral radiographs could better characterized if clinically indicated. 2. Cardiomegaly and pulmonary vascular congestion with suspected small left pleural effusion. 3. Right hilar/suprahilar masslike prominence, better characterized on recent CT chest from 06/23/2020. Electronically Signed   By: Margaretha Sheffield MD   On: 09/02/2020 14:18    Procedures .Critical Care Performed by: Eustaquio Maize, PA-C Authorized by: Eustaquio Maize, PA-C   Critical care provider statement:    Critical care time (minutes):  50   Critical care was time spent personally by me on the following activities:  Discussions with consultants, evaluation of patient's response to treatment, examination of patient, ordering and performing treatments and interventions, ordering and review of laboratory studies, ordering and review  of radiographic studies, pulse oximetry, re-evaluation of patient's condition, obtaining history from patient or surrogate and review of old charts     Medications Ordered in ED Medications  cefTRIAXone (ROCEPHIN) 1 g in sodium chloride 0.9 % 100 mL IVPB (has no administration in time range)  azithromycin (ZITHROMAX) 500 mg in sodium chloride 0.9 % 250 mL IVPB (has no administration in time range)  pantoprazole (PROTONIX) injection 40 mg (has no administration in time range)    ED Course  I have reviewed the triage vital signs and the nursing notes.  Pertinent labs & imaging results that were available during my care of the patient were reviewed by me and considered in my medical decision making (see chart for details).    MDM Rules/Calculators/A&P                          80 year old female presents to the ED today with family with concern for worsening altered mental status over the past several weeks, has been treated for UTI recently. Initially on Bactrim and then  changed to Keflex without improvement in symptoms. On arrival to the ED vitals are stable. Patient is afebrile, nontachycardic nontachypneic and appears to be in no acute distress. Patient is currently at baseline in terms of oriented to self and place. She is following all commands without difficulty, no signs of obvious unilateral weakness, facial droop. It does appear patient has history of altered mental status in the setting of hepatic encephalopathy however not currently on lactulose. We will plan to work-up for altered mental status at this time with labs including CBC, CMP, UA, UDS, ammonia level. We will also CT head. Will obtain chest x-ray to rule out pneumonia. If no acute findings on labs at this time may consider discharge home, patient does have 24-hour care at home with family. Consider that this may just be worsening dementia at this time.  CT Head negative CXR with findings of possible atelectasis vs PNA vs aspiration. Will await CBC to see if pt has a leukocytosis prior to starting abx. Daughter denies cough or respiratory complaints.   Blood work delayed due to pt being a hard stick with unsuccessful IV access by IV team. IV placed by Dr. Billy Fischer successfully with blood draw.   CBC has returned with a leukocytosis of 14.6 as well as a critical hgb of 5.3 down from 10.2 1 month ago; pt is a Jehovah witness and will not accept blood per family; rectal exam performed with chaperone at beside. Stool does appear dark at this time. INR has also returned critical at 6.2. Will provide protonix at this time. Attending physician Dr. Billy Fischer to discuss with family if they want [place] to be aggressive and reverse her coumadin today. They do state that pt does not want CPR or intubation however is amenable to other measures including antibiotics, fluids, etc despite being on hospice  Hemoglobin  Date Value Ref Range Status  09/02/2020 5.3 (LL) 12.0 - 15.0 g/dL Final    Comment:    REPEATED TO  VERIFY THIS CRITICAL RESULT HAS VERIFIED AND BEEN CALLED TO G TATE RN BY KIRSTENE FORSYTH ON 03 01 2022 AT 1814, AND HAS BEEN READ BACK.    07/28/2020 10.2 (L) 12.0 - 15.0 g/dL Final  06/26/2020 9.7 (L) 12.0 - 15.0 g/dL Final  06/25/2020 9.8 (L) 12.0 - 15.0 g/dL Final  10/29/2014 10.7 (L) 11.1 - 15.9 g/dL Final   Pt to be  admitted by Dr. Billy Fischer at shift change after goals of care discussion with family.   This note was prepared using Dragon voice recognition software and may include unintentional dictation errors due to the inherent limitations of voice recognition software.   Final Clinical Impression(s) / ED Diagnoses Final diagnoses:  Anemia, unspecified type  Melena  Supratherapeutic INR  Weakness  Community acquired pneumonia, unspecified laterality    Rx / DC Orders ED Discharge Orders    None       Eustaquio Maize, PA-C 09/02/20 1903    Gareth Morgan, MD 09/05/20 1553

## 2020-09-02 NOTE — ED Notes (Signed)
Got patient on the monitor did ekg shown to er doctor patient is resting with call bell in reach 

## 2020-09-02 NOTE — H&P (Addendum)
History and Physical    Brittany Roberts KGM:[id number] DOB: 05/01/1941 DOA: 09/02/2020  PCP: [name], [name], MD  Patient coming from: Home.  History obtained from patient's daughter.  Patient is confused and lethargic.  Chief Complaint: Confusion.  HPI: Brittany Roberts is a 80 y.o. female with history of dementia, CHF, COPD, diabetes mellitus, LV thrombus, A. fib, chronic kidney disease stage III who was recently discontinued off her anticoagulation due to minimizing medication and under hospice care with history of lung mass which patient's daughter has not wanting to pursue aggressive measures was found to be increasingly confused over the last 2 weeks with poor Po intake.  Did not have any nausea vomiting or chest pain.  ED Course: In the ER patient appeared confused CT head showed nothing acute.  Chest x-ray shows possible aspiration.  Labs are significant for hemoglobin of 5.3 with INR of 6.2.  Stool for occult blood was positive.  Covid test was negative.  GI on-call was consulted requested conservative management given the multiple comorbidities.  Patient is a Sales promotion account executive Witness and this was confirmed with patient's daughter.  Does not want any blood transfusion.  IV iron was ordered along with vitamin K 10 mg.  Review of Systems: As per HPI, rest all negative.   Past Medical History:  Diagnosis Date  . Acute delirium 01/14/2017  . Anxiety   . Arthritis    "all over"  . Asthma   . Bipolar disorder ([place])   . Complication of anesthesia    "w/right foot OR they gave me too much and they couldn't get me woke"  . Congestive heart failure, unspecified   . Coronary artery calcification seen on CAT scan 01/14/2017  . Coronary artery disease    "I've got 1 stent" (06/30/2015)  . Depression   . Discoid lupus   . Fibromyalgia    "I've been told I have this; can't take Lyrica cause I'm allergic to it" (06/30/2015)  . GERD (gastroesophageal reflux disease)   . History of gout   . History of  hiatal hernia    "real bad" (06/30/2015)  . Hypertension   . Hypothyroidism   . Memory loss   . On home oxygen therapy    "2L q hs" (06/30/2015)  . OSA (obstructive sleep apnea)    "just use my oxygen; no mask" (06/30/2015)  . Other and unspecified hyperlipidemia   . Penetrating foot wound    left nonhealing foot wound on the dorsal surface  . Peripheral neuropathy   . Pneumonia "many of times"  . Refusal of blood transfusions as patient is Jehovah's Witness   . Renal failure, unspecified    "kidneys not working 100%" (06/30/2015)  . Seizure ([place])   . Sickle cell trait ([place])   . Systemic lupus ([place])   . Thoracic aortic atherosclerosis ([place]) 01/14/2017  . Type II diabetes mellitus ([place])   . Unspecified vitamin D deficiency     Past Surgical History:  Procedure Laterality Date  . ANKLE ARTHROSCOPY WITH OPEN REDUCTION INTERNAL FIXATION (ORIF) Right 03/2011  . CARDIAC CATHETERIZATION N/A 07/01/2015   Procedure: Left Heart Cath and Coronary Angiography;  Surgeon: Adrian Prows, MD;  Location: [facility];  Service: Cardiovascular;  Laterality: N/A;  . CARDIAC CATHETERIZATION N/A 07/01/2015   Procedure: Intravascular Pressure Wire/FFR Study;  Surgeon: Adrian Prows, MD;  Location: [facility];  Service: Cardiovascular;  Laterality: N/A;  . CARPAL TUNNEL RELEASE Bilateral   . CATARACT EXTRACTION W/  INTRAOCULAR LENS  IMPLANT, BILATERAL Bilateral   . CHOLECYSTECTOMY OPEN    . COLONOSCOPY N/A 12/10/2013   Procedure: COLONOSCOPY;  Surgeon: Inda Castle, MD;  Location: [facility];  Service: Endoscopy;  Laterality: N/A;  . FRACTURE SURGERY    . INCISION AND DRAINAGE ABSCESS Left    foot "under my little toe"  . KNEE LIGAMENT RECONSTRUCTION Right   . LEFT HEART CATHETERIZATION WITH CORONARY ANGIOGRAM N/A 08/08/2012   Procedure: LEFT HEART CATHETERIZATION WITH CORONARY ANGIOGRAM;  Surgeon: [name], MD;  Location: [facility] CATH LAB;  Service: Cardiovascular;  Laterality: N/A;  .  NASAL SINUS SURGERY    . PERCUTANEOUS CORONARY STENT INTERVENTION (PCI-S) N/A 08/21/2012   Procedure: PERCUTANEOUS CORONARY STENT INTERVENTION (PCI-S);  Surgeon: [name], MD;  Location: [facility] C[facility] LAB;  Service: Cardiovascular;  Laterality: N/A;  . TUBAL LIGATION       reports that she quit smoking about 22 years ago. Her smoking use included cigarettes. She started smoking about 60 years ago. She has a 120.00 pack-year smoking history. She has never used smokeless tobacco. She reports that she does not drink alcohol and does not use drugs.  Allergies  Allergen Reactions  . Ace Inhibitors Other (See Comments)    Coincided with sig bump in creat. Retried and creat bumped again.   Marland Kitchen Penicillins Anaphylaxis, Swelling and Rash    Has patient had a PCN reaction causing immediate rash, facial/tongue/throat swelling, SOB or lightheadedness with hypotension: Yes Has patient had a PCN reaction causing severe rash involving mucus membranes or skin necrosis: Yes Has patient had a PCN reaction that required hospitalization: Yes Has patient had a PCN reaction occurring within the last 10 years: No If all of the above answers are "NO", then may proceed with Cephalosporin use.   Marland Kitchen Buspirone Other (See Comments)    pain   . Isosorb Dinitrate-Hydralazine Other (See Comments)    Sleep all the time.   . Pregabalin Swelling  . Ropinirole Hydrochloride Swelling  . Amantadines Rash    "Swelling of the tongue"    Family History  Problem Relation Age of Onset  . Heart disease Mother   . Diabetes Mother   . Clotting disorder Mother   . Pneumonia Father   . Rheum arthritis Father   . Diabetes Sister   . Diabetes Sister   . Asthma Brother   . Cancer Brother   . Kidney disease Brother   . Lupus Son   . Heart disease Son   . Heart disease Daughter   . Colon cancer Neg Hx     Prior to Admission medications   Medication Sig Start Date End Date Taking? Authorizing Provider  Accu-Chek  Softclix Lancets lancets Use as instructed 07/24/20   Cleophas Dunker, Amy E, NP  acetaminophen (TYLENOL) 500 MG tablet Take 500 mg by mouth every 6 (six) hours as needed for moderate pain.    [provider]  albuterol (PROVENTIL) (2.5 MG/3ML) 0.083% nebulizer solution Take 3 mLs (2.5 mg total) by nebulization every 8 (eight) hours as needed for wheezing or shortness of breath. When not using albuterol inhaler. 08/22/20   [name], [name], MD  allopurinol (ZYLOPRIM) 100 MG tablet Take 2 tablets (200 mg total) by mouth daily. 07/23/20   Fargo, Amy E, NP  amLODipine (NORVASC) 2.5 MG tablet Take 1 tablet (2.5 mg total) by mouth daily. 07/23/20   Fargo, Amy E, NP  Ascorbic Acid (VITAMIN C) 500 MG CAPS Take 1 capsule by  mouth daily. 02/26/20   [name], [name], MD  atorvastatin (LIPITOR) 20 MG tablet TAKE 1 TABLET(20 MG) BY MOUTH DAILY 07/23/20   Fargo, Amy E, NP  B Complex Vitamins (VITAMIN B COMPLEX) TABS Take 1 tablet by mouth daily.    [provider]  Cholecalciferol (VITAMIN D3) 125 MCG (5000 UT) TABS Take 5,000 Units by mouth daily.    [provider]  DULoxetine (CYMBALTA) 60 MG capsule Take 1 capsule (60 mg total) by mouth daily. 07/23/20   Fargo, Amy E, NP  feeding supplement (ENSURE ENLIVE / ENSURE PLUS) LIQD Take 237 mLs by mouth 4 (four) times daily. 06/27/20   Debbe Odea, MD  Fluocinolone Acetonide Scalp 0.01 % OIL Apply 1 application topically at bedtime as needed (itching scalp).  07/30/19   [provider]  Fluticasone-Salmeterol (ADVAIR) 250-50 MCG/DOSE AEPB Inhale 1 puff into the lungs 2 (two) times daily. 07/23/20   Fargo, Amy E, NP  gabapentin (NEURONTIN) 100 MG capsule Take 1 capsule (100 mg total) by mouth at bedtime. 07/23/20   Fargo, Amy E, NP  glucose blood (ACCU-CHEK GUIDE) test strip Use to test blood sugars 1-2 times daily. Dx: e11.9 07/24/20   Yvonna Alanis, NP  HUMULIN N KWIKPEN 100 UNIT/ML Kiwkpen Inject into the skin. 08/02/20   [provider]   insulin aspart (NOVOLOG) 100 UNIT/ML injection Inject 0-9 Units into the skin 3 (three) times daily with meals. CBG 70 - 120: 0 units  CBG 121 - 150: 1 unit  CBG 151 - 200: 2 units  CBG 201 - 250: 3 units  CBG 251 - 300: 5 units  CBG 301 - 350: 7 units  CBG 351 - 400 9 units 07/23/20   Fargo, Amy E, NP  insulin glargine (LANTUS) 100 unit/mL SOPN Inject 10 Units into the skin at bedtime. 08/18/20   [name], [name], MD  Insulin Pen Needle 31G X 5 MM MISC 4 each by Does not apply route daily as needed (used to administer insulin AC & HS). 07/24/20   Fargo, Amy E, NP  isosorbide mononitrate (IMDUR) 30 MG 24 hr tablet TAKE 1 TABLET(30 MG) BY MOUTH DAILY 07/23/20   Fargo, Amy E, NP  LORazepam (ATIVAN) 0.5 MG tablet Take 1 tablet (0.5 mg total) by mouth 2 (two) times daily as needed for anxiety. 07/30/20   [name], [name], MD  melatonin 5 MG TABS Take 5 mg by mouth.    [provider]  memantine (NAMENDA) 10 MG tablet Take 1 tablet (10 mg total) by mouth daily. 07/23/20   Fargo, Amy E, NP  metoprolol succinate (TOPROL-XL) 50 MG 24 hr tablet TAKE 1 TABLET BY MOUTH EVERY DAY. TAKE WITH OR IMMEDIATELY FOLLOWING A MEAL 07/23/20   Fargo, Amy E, NP  montelukast (SINGULAIR) 10 MG tablet TAKE 1 TABLET(10 MG) BY MOUTH AT BEDTIME 07/23/20   Fargo, Amy E, NP  Omega-3 Fatty Acids (FISH OIL) 1000 MG CAPS Take 1,000 mg by mouth daily.     [provider]  pantoprazole (PROTONIX) 40 MG tablet TAKE 1 TABLET(40 MG) BY MOUTH DAILY 07/23/20   Fargo, Amy E, NP  Polyethyl Glycol-Propyl Glycol (SYSTANE) 0.4-0.3 % GEL ophthalmic gel Place 1 application into both eyes every 4 (four) hours as needed (dry eyes).    [provider]  PROAIR HFA 108 (90 Base) MCG/ACT inhaler INHALE 2 PUFF BY MOUTH EVERY 4 HOURS AS NEEDED USE ONLY IF YOU ARE WHEEZING 07/16/20   Fargo, Amy E,  NP  torsemide (DEMADEX) 20 MG tablet TAKE 1 TABLET(20 MG) BY MOUTH TWICE DAILY 08/04/20   Patwardhan, Manish J, MD  warfarin (COUMADIN) 2.5 MG  tablet Take 2.5 mg by mouth daily.    [provider]    Physical Exam: Constitutional: Moderately built and nourished. Vitals:   09/02/20 1915 09/02/20 1930 09/02/20 1945 09/02/20 2000  BP: 114/61 (!) 126/59 136/60 (!) 143/76  Pulse: 60 (!) 54 (!) 57 76  Resp: (!) 21 19 19 20   Temp:      TempSrc:      SpO2: 100% 100% 100% 100%  Weight:      Height:       Eyes: Anicteric no pallor. ENMT: No discharge from the ears eyes nose or mouth. Neck: No mass felt.  No neck rigidity. Respiratory: No rhonchi or crepitations. Cardiovascular: S1-S2 heard. Abdomen: Soft nontender bowel sound present. Musculoskeletal: No edema. Skin: No rash. Neurologic: Patient appears lethargic and confused. Psychiatric: Appears lethargic and confused.   Labs on Admission: I have personally reviewed following labs and imaging studies  CBC: Recent Labs  Lab 09/02/20 1758  WBC 14.6*  NEUTROABS 11.0*  HGB 5.3*  HCT 16.0*  MCV 79.2*  PLT 254   Basic Metabolic Panel: Recent Labs  Lab 09/02/20 1758  NA 140  K 3.7  CL 95*  CO2 36*  GLUCOSE 239*  BUN 54*  CREATININE 2.04*  CALCIUM 10.3   GFR: Estimated Creatinine Clearance: 28.6 mL/min (A) (by C-G formula based on SCr of 2.04 mg/dL (H)). Liver Function Tests: Recent Labs  Lab 09/02/20 1758  AST 35  ALT 21  ALKPHOS 97  BILITOT 0.7  PROT 7.1  ALBUMIN 2.9*   Recent Labs  Lab 09/02/20 1758  LIPASE 35   Recent Labs  Lab 09/02/20 1737  AMMONIA 24   Coagulation Profile: Recent Labs  Lab 09/02/20 1758  INR 6.2*   Cardiac Enzymes: No results for input(s): CKTOTAL, CKMB, CKMBINDEX, TROPONINI in the last 168 hours. BNP (last 3 results) No results for input(s): PROBNP in the last 8760 hours. HbA1C: No results for input(s): HGBA1C in the last 72 hours. CBG: Recent Labs  Lab 09/02/20 1403  GLUCAP 264*   Lipid Profile: No results for input(s): CHOL, HDL, LDLCALC, TRIG, CHOLHDL, LDLDIRECT in the last 72  hours. Thyroid Function Tests: No results for input(s): TSH, T4TOTAL, FREET4, T3FREE, THYROIDAB in the last 72 hours. Anemia Panel: No results for input(s): VITAMINB12, FOLATE, FERRITIN, TIBC, IRON, RETICCTPCT in the last 72 hours. Urine analysis:    Component Value Date/Time   COLORURINE YELLOW 09/02/2020 1300   APPEARANCEUR HAZY (A) 09/02/2020 1300   LABSPEC 1.009 09/02/2020 1300   PHURINE 5.0 09/02/2020 1300   GLUCOSEU NEGATIVE 09/02/2020 1300   HGBUR NEGATIVE 09/02/2020 1300   HGBUR negative 02/26/2009 1314   BILIRUBINUR NEGATIVE 09/02/2020 1300   BILIRUBINUR NEG 04/05/2014 1455   KETONESUR NEGATIVE 09/02/2020 1300   PROTEINUR NEGATIVE 09/02/2020 1300   UROBILINOGEN 0.2 08/01/2014 2305   NITRITE NEGATIVE 09/02/2020 1300   LEUKOCYTESUR NEGATIVE 09/02/2020 1300   Sepsis Labs: @LABRCNTIP (procalcitonin:4,lacticidven:4) )No results found for this or any previous visit (from the past 240 hour(s)).   Radiological Exams on Admission: CT HEAD WO CONTRAST  Result Date: 09/02/2020 CLINICAL DATA:  Mental status changes. EXAM: CT HEAD WITHOUT CONTRAST TECHNIQUE: Contiguous axial images were obtained from the base of the skull through the vertex without intravenous contrast. COMPARISON:  CT head of 06/22/2020 and MRI brain from 06/23/2020 FINDINGS: Brain:  Chronic right frontal MCA distribution infarct noted, unchanged distribution of encephalomalacia from 06/22/2020. Otherwise, the brainstem, cerebellum, cerebral peduncles, thalamus, basal ganglia, basilar cisterns, and ventricular system appear within normal limits. No intracranial hemorrhage, mass lesion, or acute CVA. Vascular: There is atherosclerotic calcification of the cavernous carotid arteries bilaterally. Skull: No fracture or acute bony findings. Borderline thickened calvarium. Sinuses/Orbits: Chronic deformity of the right maxillary sinus with adjacent bony expansion and postoperative findings. Chronic sclerosis in the left mandibular  condyle with associated superior surface flattening or erosion. Other: No supplemental non-categorized findings. IMPRESSION: 1. No acute intracranial findings. 2. Chronic right frontal MCA distribution infarct, unchanged distribution of encephalomalacia from 06/22/2020. 3. Chronic deformity of the right maxillary sinus with adjacent bony expansion and likely underlying postoperative findings. 4. Chronic sclerosis in the left mandibular condyle with associated superior surface flattening or erosion. Electronically Signed   By: Van Clines M.D.   On: 09/02/2020 15:15   DG Chest Port 1 View  Result Date: 09/02/2020 CLINICAL DATA:  Rule out infection. EXAM: PORTABLE CHEST 1 VIEW COMPARISON:  CT chest June 23, 2020. Chest radiographs July 28, 2020. FINDINGS: Right hilar/suprahilar masslike prominence, better characterized on recent CT chest from 06/23/2020. Enlarged cardiac silhouette. Aortic atherosclerosis. Poorly characterized left basilar opacities. Suspected small left pleural effusion. No visible pneumothorax. IMPRESSION: 1. Left basilar opacities which are poorly characterized on this portable radiograph, possibly atelectasis, aspiration, and/or pneumonia. Dedicated PA and lateral radiographs could better characterized if clinically indicated. 2. Cardiomegaly and pulmonary vascular congestion with suspected small left pleural effusion. 3. Right hilar/suprahilar masslike prominence, better characterized on recent CT chest from 06/23/2020. Electronically Signed   By: Margaretha Sheffield MD   On: 09/02/2020 14:18    EKG: Independently reviewed.  Tachycardia rhythm not clear.  Assessment/Plan Principal Problem:   Acute GI bleeding Active Problems:   Hypothyroidism   DM (diabetes mellitus), type 2 with neurological complications (HCC)   Type 2 diabetes mellitus with diabetic neuropathy, unspecified (HCC)   Hx of systemic lupus erythematosus (SLE) (HCC)   Chronic kidney disease (CKD), stage  IV (severe) (HCC)   LV (left ventricular) mural thrombus    1. Acute GI bleeding in the setting of anticoagulation appreciate GI input at this time planning conservative management of Protonix and reversing anticoagulation.  Since patient is a [place] Witness family has requested no blood product transfusion.  Will use ordered IV a.m. follow CBC with minimal blood draw. 2. History of A. fib and LV thrombus presently off anticoagulation as patient family is minimizing medications.  Patient's INR is elevated for which vitamin K has been given. 3. Coagulopathy secondary to anticoagulation.  Vitamin K IV ordered follow INR. 4. Acute encephalopathy could be multifactorial including possible infection from pneumonia. 5. Possible pneumonia likely aspiration for which speech therapy evaluation is requested.  On empiric antibiotics. 6. Diabetes mellitus type 2 we will keep patient on sliding scale coverage. 7. History of CHF presently holding Lasix.  Follow respiratory status. 8. History of SLE. 9. History of hypertension presently holding antihypertensives since patient blood pressures in the low normal. 10. Chronic kidney disease stage III creatinine appears to be at baseline.  Follow metabolic panel. 11. History of dementia presently encephalopathic. 12. History of lung mass for which patient's family has requested no further aggressive work-up.  Since patient has acute GI bleed with possible aspiration pneumonia and coagulopathy will need inpatient status.   DVT prophylaxis: SCDs.  Patient has coagulopathy. Code Status: DNR confirmed with patient's daughter.  Family Communication: Patient's daughter. Disposition Plan: To be determined. Consults called: GI and palliative care. Admission status: Inpatient.   Rise Patience MD [facility] Pager [phone].  If 7PM-7AM, please contact night-coverage [email or web address] Password [facility]  09/02/2020, 9:32 PM

## 2020-09-02 NOTE — Progress Notes (Signed)
Got called for possible consultation.   I have reviewed the chart and discussed with the ED team   80 year old with MMP including advanced dementia Jehovah's Witness On hospice care Adm d/t change in MS, has potential aspiration pneumonia/urosepsis Found to have Hb 5.0, INR 6.2 (on Coumadin) Has dark tarry stools on rectal exam. No active bleeding.    Plan. -She is clearly not a candidate for endoscopy. In fact endoscopy, may be detrimental and potentially fatal (now and in the near future). She is also not a candidate for anesthesia (now or in the near future). -Recommend conservative management. Including IV Protonix IV iron Kcentra/FFP to reverse INR Minimize blood draws. Comfort care.   Carmell Austria, MD [facility] [phone]

## 2020-09-03 DIAGNOSIS — K922 Gastrointestinal hemorrhage, unspecified: Secondary | ICD-10-CM | POA: Diagnosis not present

## 2020-09-03 DIAGNOSIS — I513 Intracardiac thrombosis, not elsewhere classified: Secondary | ICD-10-CM

## 2020-09-03 DIAGNOSIS — N184 Chronic kidney disease, stage 4 (severe): Secondary | ICD-10-CM | POA: Diagnosis not present

## 2020-09-03 DIAGNOSIS — Z66 Do not resuscitate: Secondary | ICD-10-CM

## 2020-09-03 DIAGNOSIS — Z7189 Other specified counseling: Secondary | ICD-10-CM

## 2020-09-03 DIAGNOSIS — M329 Systemic lupus erythematosus, unspecified: Secondary | ICD-10-CM | POA: Diagnosis not present

## 2020-09-03 DIAGNOSIS — Z515 Encounter for palliative care: Secondary | ICD-10-CM

## 2020-09-03 LAB — GLUCOSE, CAPILLARY
Glucose-Capillary: 214 mg/dL — ABNORMAL HIGH (ref 70–99)
Glucose-Capillary: 172 mg/dL — ABNORMAL HIGH (ref 70–99)

## 2020-09-03 LAB — SARS CORONAVIRUS 2 (TAT 6-24 HRS): SARS Coronavirus 2: NEGATIVE

## 2020-09-03 LAB — CBG MONITORING, ED: Glucose-Capillary: 212 mg/dL — ABNORMAL HIGH (ref 70–99)

## 2020-09-03 MED ORDER — ACETAMINOPHEN 325 MG PO TABS: 650.0000 mg | ORAL_TABLET | Freq: Four times a day (QID) | ORAL | Status: DC | PRN

## 2020-09-03 MED ORDER — HALOPERIDOL LACTATE 2 MG/ML PO CONC
0.5000 mg | ORAL | Status: DC | PRN
  Administered 2020-09-03 – 2020-09-04 (×2): 0.5 mg via SUBLINGUAL
  Filled 2020-09-03 (×4): qty 0.3

## 2020-09-03 MED ORDER — LORAZEPAM 1 MG PO TABS
1.0000 mg | ORAL_TABLET | ORAL | Status: DC | PRN
  Administered 2020-09-04: 1 mg via ORAL
  Filled 2020-09-03: qty 1

## 2020-09-03 MED ORDER — ONDANSETRON 4 MG PO TBDP
4.0000 mg | ORAL_TABLET | Freq: Four times a day (QID) | ORAL | Status: DC | PRN
  Filled 2020-09-03: qty 1

## 2020-09-03 MED ORDER — ACETAMINOPHEN 650 MG RE SUPP: 650.0000 mg | Freq: Four times a day (QID) | RECTAL | Status: DC | PRN

## 2020-09-03 MED ORDER — DIPHENHYDRAMINE HCL 50 MG/ML IJ SOLN: 12.5000 mg | INTRAMUSCULAR | Status: DC | PRN

## 2020-09-03 MED ORDER — HALOPERIDOL LACTATE 5 MG/ML IJ SOLN
0.5000 mg | INTRAMUSCULAR | Status: DC | PRN
Start: 2020-09-03 — End: 2020-09-04

## 2020-09-03 MED ORDER — LORAZEPAM 2 MG/ML PO CONC
1.0000 mg | ORAL | Status: DC | PRN
Start: 2020-09-03 — End: 2020-09-04

## 2020-09-03 MED ORDER — HALOPERIDOL 0.5 MG PO TABS
0.5000 mg | ORAL_TABLET | ORAL | Status: DC | PRN
  Filled 2020-09-03: qty 1

## 2020-09-03 MED ORDER — SODIUM CHLORIDE 0.9 % IV SOLN: 500.0000 mg | INTRAVENOUS | Status: DC

## 2020-09-03 MED ORDER — LORAZEPAM 2 MG/ML IJ SOLN: 1.0000 mg | INTRAMUSCULAR | Status: DC | PRN

## 2020-09-03 MED ORDER — QUETIAPINE FUMARATE 25 MG PO TABS
25.0000 mg | ORAL_TABLET | Freq: Two times a day (BID) | ORAL | Status: DC
  Administered 2020-09-03 – 2020-09-04 (×2): 25 mg via ORAL
  Filled 2020-09-03 (×4): qty 1

## 2020-09-03 MED ORDER — SODIUM CHLORIDE 0.9 % IV SOLN: 1.0000 g | INTRAVENOUS | Status: DC

## 2020-09-03 MED ORDER — BISACODYL 10 MG RE SUPP: 10.0000 mg | Freq: Every day | RECTAL | Status: DC | PRN

## 2020-09-03 MED ORDER — PANTOPRAZOLE SODIUM 40 MG IV SOLR
40.0000 mg | Freq: Two times a day (BID) | INTRAVENOUS | Status: DC
  Administered 2020-09-03 – 2020-09-04 (×3): 40 mg via INTRAVENOUS
  Filled 2020-09-03 (×3): qty 40

## 2020-09-03 MED ORDER — ONDANSETRON HCL 4 MG/2ML IJ SOLN: 4.0000 mg | Freq: Four times a day (QID) | INTRAMUSCULAR | Status: DC | PRN

## 2020-09-03 NOTE — Progress Notes (Signed)
[facility])      This patient is enrolled in hospice services, admitted 08/05/20 with a terminal diagnosis of hypertensive heart disease.  [facility] will continue to follow for any discharge planning needs and to coordinate continuation of hospice care.  .     Thank you for the opportunity to participate in this patients care.     Kaliana Albino, BSN, RN [facility] Liaison   [phone] (24 h on call)

## 2020-09-03 NOTE — Progress Notes (Signed)
Palliative Medicine RN Note: Consult order noted.   Patient is actively admitted to [facility] ([facility]).  I spoke with Chrislyn with [facility], and this will likely be a GIP/hospice admission. Chrislyn will review Ms Whorley's chart and reach out to family/care team as appropriate. At this time, Chrislyn does not feel like PMT is needed, and [facility] will address GOC with family/pt.  At this time, PMT will not see Ms Cuadras.   Marjie Skiff Tatsuya Okray, RN, BSN, [facility] Palliative Medicine Team 09/03/2020 10:06 AM Office [phone]

## 2020-09-03 NOTE — Progress Notes (Signed)
PROGRESS NOTE    Brittany Roberts  VVO:[id number] DOB: 08-25-1940 DOA: 09/02/2020 PCP: [name], [name], MD    Chief Complaint  Patient presents with  . Altered Mental Status    Brief Narrative:  Brittany Roberts is a 80 y.o. female with history of dementia, CHF, COPD, diabetes mellitus, LV thrombus, A. fib, chronic kidney disease stage III who was recently discontinued off her anticoagulation due to minimizing medication and under hospice care with history of lung mass which patient's daughter has not wanting to pursue aggressive measures was found to be increasingly confused over the last 2 weeks with poor Po intake.  In the ER patient appeared confused CT head showed nothing acute.  Chest x-ray shows possible aspiration.  Labs are significant for hemoglobin of 5.3 with INR of 6.2.  Stool for occult blood was positive.  Covid test was negative.  GI on-call was consulted requested conservative management given the multiple comorbidities.  Patient is a Sales promotion account executive Witness and this was confirmed with patient's daughter.  Does not want any blood transfusion.  IV iron was ordered along with vitamin K 10 mg.  Case discussed with hospitalist liaison and 2 daughters all agree care will transition to full comfort measures  Subjective:  Confused, with intermittent agitation  Assessment & Plan:   Principal Problem:   Acute GI bleeding Active Problems:   Hypothyroidism   DM (diabetes mellitus), type 2 with neurological complications ([place])   Type 2 diabetes mellitus with diabetic neuropathy, unspecified (HCC)   Hx of systemic lupus erythematosus (SLE) (HCC)   Chronic kidney disease (CKD), stage IV (severe) (HCC)   LV (left ventricular) mural thrombus   Acute GI bleeding in the setting of supratherapeutic INR She received IV iron IV vitamin K on admission, no blood transfusion due to [place] Witness Family decided to transition care to full comfort measures  Possible aspiration pneumonia, daughter  report patient has trouble swallowing at home as well Goals of care now transition to full comfort measures, will liberate diet knowing patient will have risk of aspiration, antibiotic discontinued  Acute metabolic encephalopathy/in the background of advanced dementia Start full comfort measures  History of lung mass no further work-up per family  History of A. Fib, LV thrombus Of anticoagulation, transition care to full comfort measures  Class III obesity  Body mass index is 45.69 kg/m..  FTT, family desires full comfort measures transition to residential hospice for symptom management, transitional care order placed, hospice liaison made aware     Unresulted Labs (From admission, onward)          Start     Ordered   09/02/20 1737  Urine culture  ONCE - STAT,   STAT        09/02/20 1736            DVT prophylaxis: SCDs Start: 09/02/20 2131   Code Status: DNR Family Communication: 2 daughters Disposition:   Status is: Inpatient   Dispo: The patient is from: Home              Anticipated d/c is to: Residential hospice when there is a bed                Consultants:   Hospice  Procedures:   None  Antimicrobials:   Anti-infectives (From admission, onward)   Start     Dose/Rate Route Frequency Ordered Stop   09/03/20 2000  azithromycin (ZITHROMAX) 500 mg in sodium chloride 0.9 % 250 mL IVPB  Status:  Discontinued        500 mg 250 mL/hr over 60 Minutes Intravenous Every 24 hours 09/03/20 0111 09/03/20 1847   09/03/20 1800  cefTRIAXone (ROCEPHIN) 1 g in sodium chloride 0.9 % 100 mL IVPB  Status:  Discontinued        1 g 200 mL/hr over 30 Minutes Intravenous Every 24 hours 09/03/20 0111 09/03/20 1847   09/02/20 1845  cefTRIAXone (ROCEPHIN) 1 g in sodium chloride 0.9 % 100 mL IVPB        1 g 200 mL/hr over 30 Minutes Intravenous  Once 09/02/20 1833 09/02/20 2133   09/02/20 1845  azithromycin (ZITHROMAX) 500 mg in sodium chloride 0.9 % 250 mL IVPB        500  mg 250 mL/hr over 60 Minutes Intravenous  Once 09/02/20 1833 09/02/20 2312          Objective: Vitals:   09/02/20 2300 09/03/20 0115 09/03/20 0149 09/03/20 0242  BP: (!) 124/96 123/67  (!) 87/69  Pulse: 73 (!) 57  72  Resp: 19 19  18   Temp:   97.7 F (36.5 C) 98.3 F (36.8 C)  TempSrc:   Axillary Oral  SpO2: 100% 100%  99%  Weight:    117 kg  Height:        Intake/Output Summary (Last 24 hours) at 09/03/2020 0932 Last data filed at 09/02/2020 2255 Gross per 24 hour  Intake --  Output 700 ml  Net -700 ml   Filed Weights   09/02/20 1254 09/03/20 0242  Weight: 127.1 kg 117 kg    Examination:  General exam: Demented elderly , with intermittent agitation , seems getting better after tele removal and family at bedside  Respiratory system: She refused lung exam , does not appear in respiratory distress . She refused being examined    Data Reviewed: I have personally reviewed following labs and imaging studies  CBC: Recent Labs  Lab 09/02/20 1758  WBC 14.6*  NEUTROABS 11.0*  HGB 5.3*  HCT 16.0*  MCV 79.2*  PLT 355    Basic Metabolic Panel: Recent Labs  Lab 09/02/20 1758  NA 140  K 3.7  CL 95*  CO2 36*  GLUCOSE 239*  BUN 54*  CREATININE 2.04*  CALCIUM 10.3    GFR: Estimated Creatinine Clearance: 27.2 mL/min (A) (by C-G formula based on SCr of 2.04 mg/dL (H)).  Liver Function Tests: Recent Labs  Lab 09/02/20 1758  AST 35  ALT 21  ALKPHOS 97  BILITOT 0.7  PROT 7.1  ALBUMIN 2.9*    CBG: Recent Labs  Lab 09/02/20 1403 09/02/20 2239 09/03/20 0117 09/03/20 0401  GLUCAP 264* 241* 212* 214*     Recent Results (from the past 240 hour(s))  SARS CORONAVIRUS 2 (TAT 6-24 HRS) Nasopharyngeal Nasopharyngeal Swab     Status: None   Collection Time: 09/02/20  6:26 PM   Specimen: Nasopharyngeal Swab  Result Value Ref Range Status   SARS Coronavirus 2 NEGATIVE NEGATIVE Final    Comment: (NOTE) SARS-CoV-2 target nucleic acids are NOT  DETECTED.  The SARS-CoV-2 RNA is generally detectable in upper and lower respiratory specimens during the acute phase of infection. Negative results do not preclude SARS-CoV-2 infection, do not rule out co-infections with other pathogens, and should not be used as the sole basis for treatment or other patient management decisions. Negative results must be combined with clinical observations, patient history, and epidemiological information. The expected result is Negative.  Fact Sheet for  Patients: SugarRoll.be  Fact Sheet for Healthcare Providers: https://www.woods-mathews.com/  This test is not yet approved or cleared by the [place] FDA and  has been authorized for detection and/or diagnosis of SARS-CoV-2 by FDA under an Emergency Use Authorization (EUA). This EUA will remain  in effect (meaning this test can be used) for the duration of the COVID-19 declaration under Se ction 564(b)(1) of the Act, 21 U.S.C. section 360bbb-3(b)(1), unless the authorization is terminated or revoked sooner.  Performed at [facility], [place] [address], [place], [place] [address]          Radiology Studies: CT HEAD WO CONTRAST  Result Date: 09/02/2020 CLINICAL DATA:  Mental status changes. EXAM: CT HEAD WITHOUT CONTRAST TECHNIQUE: Contiguous axial images were obtained from the base of the skull through the vertex without intravenous contrast. COMPARISON:  CT head of 06/22/2020 and MRI brain from 06/23/2020 FINDINGS: Brain: Chronic right frontal MCA distribution infarct noted, unchanged distribution of encephalomalacia from 06/22/2020. Otherwise, the brainstem, cerebellum, cerebral peduncles, thalamus, basal ganglia, basilar cisterns, and ventricular system appear within normal limits. No intracranial hemorrhage, mass lesion, or acute CVA. Vascular: There is atherosclerotic calcification of the cavernous carotid arteries bilaterally. Skull: No  fracture or acute bony findings. Borderline thickened calvarium. Sinuses/Orbits: Chronic deformity of the right maxillary sinus with adjacent bony expansion and postoperative findings. Chronic sclerosis in the left mandibular condyle with associated superior surface flattening or erosion. Other: No supplemental non-categorized findings. IMPRESSION: 1. No acute intracranial findings. 2. Chronic right frontal MCA distribution infarct, unchanged distribution of encephalomalacia from 06/22/2020. 3. Chronic deformity of the right maxillary sinus with adjacent bony expansion and likely underlying postoperative findings. 4. Chronic sclerosis in the left mandibular condyle with associated superior surface flattening or erosion. Electronically Signed   By: Van Clines M.D.   On: 09/02/2020 15:15   DG Chest Port 1 View  Result Date: 09/02/2020 CLINICAL DATA:  Rule out infection. EXAM: PORTABLE CHEST 1 VIEW COMPARISON:  CT chest June 23, 2020. Chest radiographs July 28, 2020. FINDINGS: Right hilar/suprahilar masslike prominence, better characterized on recent CT chest from 06/23/2020. Enlarged cardiac silhouette. Aortic atherosclerosis. Poorly characterized left basilar opacities. Suspected small left pleural effusion. No visible pneumothorax. IMPRESSION: 1. Left basilar opacities which are poorly characterized on this portable radiograph, possibly atelectasis, aspiration, and/or pneumonia. Dedicated PA and lateral radiographs could better characterized if clinically indicated. 2. Cardiomegaly and pulmonary vascular congestion with suspected small left pleural effusion. 3. Right hilar/suprahilar masslike prominence, better characterized on recent CT chest from 06/23/2020. Electronically Signed   By: Margaretha Sheffield MD   On: 09/02/2020 14:18        Scheduled Meds: . insulin aspart  0-6 Units Subcutaneous Q4H  . pantoprazole (PROTONIX) IV  40 mg Intravenous Q12H   Continuous Infusions: .  azithromycin    . cefTRIAXone (ROCEPHIN)  IV       LOS: 1 day   Time spent: 35 mins, I personally conducted goals of care discussion of his 2 daughters and discussed case with hospice liaison Greater than 50% of this time was spent in counseling, explanation of diagnosis, planning of further management, and coordination of care.   Voice Recognition Viviann Spare dictation system was used to create this note, attempts have been made to correct errors. Please contact the author with questions and/or clarifications.   Florencia Reasons, MD PhD FACP [facility]  Available via Epic secure chat 7am-7pm for nonurgent issues Please page for urgent issues To page the attending  provider between 7A-7P or the covering provider during after hours 7P-7A, please log into the web site [email or web address] and access using universal [place] password for that web site. If you do not have the password, please call the hospital operator.    09/03/2020, 7:18 AM

## 2020-09-03 NOTE — Plan of Care (Signed)

## 2020-09-03 NOTE — Evaluation (Signed)
Clinical/Bedside Swallow Evaluation Patient Details  Name: Brittany Roberts MRN: [id number] Date of Birth: 05/02/1941  Today's Date: 09/03/2020 Time: SLP Start Time (ACUTE ONLY): 0957 SLP Stop Time (ACUTE ONLY): 1004 SLP Time Calculation (min) (ACUTE ONLY): 7 min  Past Medical History:  Past Medical History:  Diagnosis Date  . Acute delirium 01/14/2017  . Anxiety   . Arthritis    "all over"  . Asthma   . Bipolar disorder ([place])   . Complication of anesthesia    "w/right foot OR they gave me too much and they couldn't get me woke"  . Congestive heart failure, unspecified   . Coronary artery calcification seen on CAT scan 01/14/2017  . Coronary artery disease    "I've got 1 stent" (06/30/2015)  . Depression   . Discoid lupus   . Fibromyalgia    "I've been told I have this; can't take Lyrica cause I'm allergic to it" (06/30/2015)  . GERD (gastroesophageal reflux disease)   . History of gout   . History of hiatal hernia    "real bad" (06/30/2015)  . Hypertension   . Hypothyroidism   . Memory loss   . On home oxygen therapy    "2L q hs" (06/30/2015)  . OSA (obstructive sleep apnea)    "just use my oxygen; no mask" (06/30/2015)  . Other and unspecified hyperlipidemia   . Penetrating foot wound    left nonhealing foot wound on the dorsal surface  . Peripheral neuropathy   . Pneumonia "many of times"  . Refusal of blood transfusions as patient is Jehovah's Witness   . Renal failure, unspecified    "kidneys not working 100%" (06/30/2015)  . Seizure ([place])   . Sickle cell trait ([place])   . Systemic lupus ([place])   . Thoracic aortic atherosclerosis ([place]) 01/14/2017  . Type II diabetes mellitus ([place])   . Unspecified vitamin D deficiency    Past Surgical History:  Past Surgical History:  Procedure Laterality Date  . ANKLE ARTHROSCOPY WITH OPEN REDUCTION INTERNAL FIXATION (ORIF) Right 03/2011  . CARDIAC CATHETERIZATION N/A 07/01/2015   Procedure: Left Heart Cath and Coronary Angiography;   Surgeon: Adrian Prows, MD;  Location: [place] CV LAB;  Service: Cardiovascular;  Laterality: N/A;  . CARDIAC CATHETERIZATION N/A 07/01/2015   Procedure: Intravascular Pressure Wire/FFR Study;  Surgeon: Adrian Prows, MD;  Location: [place] CV LAB;  Service: Cardiovascular;  Laterality: N/A;  . CARPAL TUNNEL RELEASE Bilateral   . CATARACT EXTRACTION W/ INTRAOCULAR LENS  IMPLANT, BILATERAL Bilateral   . CHOLECYSTECTOMY OPEN    . COLONOSCOPY N/A 12/10/2013   Procedure: COLONOSCOPY;  Surgeon: Inda Castle, MD;  Location: [facility];  Service: Endoscopy;  Laterality: N/A;  . FRACTURE SURGERY    . INCISION AND DRAINAGE ABSCESS Left    foot "under my little toe"  . KNEE LIGAMENT RECONSTRUCTION Right   . LEFT HEART CATHETERIZATION WITH CORONARY ANGIOGRAM N/A 08/08/2012   Procedure: LEFT HEART CATHETERIZATION WITH CORONARY ANGIOGRAM;  Surgeon: [name], MD;  Location: [facility] CATH LAB;  Service: Cardiovascular;  Laterality: N/A;  . NASAL SINUS SURGERY    . PERCUTANEOUS CORONARY STENT INTERVENTION (PCI-S) N/A 08/21/2012   Procedure: PERCUTANEOUS CORONARY STENT INTERVENTION (PCI-S);  Surgeon: [name], MD;  Location: [facility] CATH LAB;  Service: Cardiovascular;  Laterality: N/A;  . TUBAL LIGATION     HPI:  Brittany Roberts is a 80 y.o. female with history of dementia, CHF, COPD, diabetes mellitus, LV thrombus, A. fib,  chronic kidney disease stage III who was recently discontinued off her anticoagulation due to minimizing medication and under hospice care with history of lung mass which patient's daughter has not wanting to pursue aggressive measures was found to be increasingly confused over the last 2 weeks with poor Po intake.  Did not have any nausea vomiting or chest pain.  In the ER patient appeared confused CT head showed nothing acute. Chest x-ray shows possible aspiration. Stool for occult blood was positive. GI on-call was consulted requested conservative management given the multiple  comorbidities.   Assessment / Plan / Recommendation Clinical Impression  Pt presents with functional swallowing assessment as assessed clinically.  Pt tolerate all consistancies trialed with no clinical s/s of aspiration.  Pt exhibited dry cough following completion of trials. Pt reports consuming regular diet at home.  She exhibited good oral clearance of solids despite edentulism.  She states that she has dentures but does not consistently wear with PO intake; they were not present in room.  There is a possibility of silent aspiration, given pt's abnormal CXR and harsh/dysphonic vocal quality, but considering overall prognosis and goals of care, an instrumental evaluation does not seem warranted at this time.  If instrumental assessment is desired, please place orders for MBSS.    Pt may initiate regular texture diet with thin liquids as medically appropriate in light of GI bleed.  SLP Visit Diagnosis: Dysphagia, unspecified (R13.10)    Aspiration Risk  Mild aspiration risk    Diet Recommendation Regular;Thin liquid   Liquid Administration via: Cup;Straw Medication Administration: Whole meds with liquid Supervision: Patient able to self feed Compensations: Slow rate;Small sips/bites Postural Changes: Seated upright at 90 degrees    Other  Recommendations Oral Care Recommendations: Oral care BID   Follow up Recommendations None      Frequency and Duration min 1 x/week  1 week       Prognosis Prognosis for Safe Diet Advancement: [place] Study   General Date of Onset: 09/03/20 HPI: Brittany Roberts is a 80 y.o. female with history of dementia, CHF, COPD, diabetes mellitus, LV thrombus, A. fib, chronic kidney disease stage III who was recently discontinued off her anticoagulation due to minimizing medication and under hospice care with history of lung mass which patient's daughter has not wanting to pursue aggressive measures was found to be increasingly confused over the last  2 weeks with poor Po intake.  Did not have any nausea vomiting or chest pain.  In the ER patient appeared confused CT head showed nothing acute. Chest x-ray shows possible aspiration. Stool for occult blood was positive. GI on-call was consulted requested conservative management given the multiple comorbidities. Type of Study: Bedside Swallow Evaluation Previous Swallow Assessment: none Diet Prior to this Study: NPO Temperature Spikes Noted: No Respiratory Status: Room air History of Recent Intubation: No Behavior/Cognition: Alert;Cooperative;Pleasant mood Oral Cavity Assessment: Within Functional Limits Oral Care Completed by SLP: No Oral Cavity - Dentition: Edentulous Vision: Functional for self-feeding Self-Feeding Abilities: Able to feed self Patient Positioning: Upright in bed Baseline Vocal Quality:  (Harsh;dysphonic) Volitional Cough:  (Fair) Volitional Swallow: Able to elicit    Oral/Motor/Sensory Function Overall Oral Motor/Sensory Function: Within functional limits Facial ROM: Within Functional Limits Facial Symmetry: Within Functional Limits Lingual ROM:  (unable to assess) Lingual Symmetry: Within Functional Limits Lingual Strength: Within Functional Limits Mandible: Within Functional Limits   Ice Chips Ice chips: Not tested   Thin Liquid Thin Liquid: Within  functional limits Presentation: Cup;Straw    Nectar Thick Nectar Thick Liquid: Not tested   Honey Thick Honey Thick Liquid: Not tested   Puree Puree: Within functional limits Presentation: Spoon   Solid     Solid: Within functional limits Presentation: [place], [place], [place] Office: [phone] 09/03/2020,10:48 AM

## 2020-09-03 NOTE — ED Notes (Signed)
Daughter asking about night time meds. Dr. Hal Hope notified and now at bedside.

## 2020-09-03 NOTE — Telephone Encounter (Signed)
Last filled 12.6.22

## 2020-09-03 NOTE — Progress Notes (Addendum)
[facility]) hospitalized hospice patient visit.  Brittany Roberts is a current hospice patient with a terminal diagnosis of Hypertensive heart disease. Family activated EMS when patient became increasing agitated and more confused. Hospice was notified. Patient was admitted to [facility] hospital with a diagnosis of acute GI bleed.  Per [facility] physician, Dr. Cherie Ouch, this is a related hospital admission.  Visited patient at bedside, daughter [name] present. Patient is alert and confused. She repeatedly told hospital liaison to step back and not stand close to her. She is refusing to take anything PO, stating to her daughter "they are trying to kill me". She refuses to taking anything PO from her daughter and will not let staff check her VS. She threw a cup of water at the bedside nurse. Daughter states she has been behaving like this at home and refusing to take her medications at home as well.  Other than the agitation, patient does not appear in distress.   VS: 98.3, 139/88, 76, 16, 97%ra I/O: 50/700  Abnormal labs:  09/02/2020 17:58 Chloride: 95 (L) CO2: 36 (H) Glucose: 239 (H) BUN: 54 (H) Creatinine: 2.04 (H) Albumin: 2.9 (L) GFR, Estimated: 24 (L) WBC: 14.6 (H) RBC: 2.02 (L) Hemoglobin: 5.3 (LL) HCT: 16.0 (L) MCV: 79.2 (L) NEUT#: 11.0 (H) Prothrombin Time: 52.9 (H) INR: 6.2 (HH)  Imaging: CXR IMPRESSION: 1. Left basilar opacities which are poorly characterized on this portable radiograph, possibly atelectasis, aspiration, and/or pneumonia. Dedicated PA and lateral radiographs could better characterized if clinically indicated. 2. Cardiomegaly and pulmonary vascular congestion with suspected small left pleural effusion. 3. Right hilar/suprahilar masslike prominence, better characterized on recent CT chest from 06/23/2020.  Medications:  Insulin sliding scale Q4hrs Protonix 40 mg IV BID Zithromax 500mg  IVPB QD Rocephin 1g IVPB QD   Problem list: Principal  Problem:   Acute GI bleeding Active Problems:   Hypothyroidism   DM (diabetes mellitus), type 2 with neurological complications (HCC)   Type 2 diabetes mellitus with diabetic neuropathy, unspecified (HCC)   Hx of systemic lupus erythematosus (SLE) (HCC)   Chronic kidney disease (CKD), stage IV (severe) (HCC)   LV (left ventricular) mural thrombus  Discharge Planning: Patient will transfer to [facility] once a room is available.  Family Contact: Spoke with daughter at bedside IDG; Updated Goals of Care: Clear. DNR and comfort care only.  Please use [facility] for ambulance transport at discharge.   A Please do not hesitate to call with questions.   Thank you,   Farrel Gordon, RN, [place] (listed on [facility] under [place])    [phone]

## 2020-09-03 NOTE — ED Notes (Signed)
Report given to Randall Hiss RN - informed me of delay since there was an issue in the rooms and pt will actually be going to room 1

## 2020-09-04 DIAGNOSIS — K922 Gastrointestinal hemorrhage, unspecified: Secondary | ICD-10-CM | POA: Diagnosis not present

## 2020-09-04 NOTE — Plan of Care (Signed)

## 2020-09-04 NOTE — Progress Notes (Addendum)
Report called to [facility] for patient.

## 2020-09-04 NOTE — TOC Transition Note (Signed)
Transition of Care [facility]) - CM/SW Discharge Note   Patient Details  Name: Brittany Roberts MRN: [id number] Date of Birth: Aug 29, 1940  [facility] of Care [facility]) CM/SW Contact:  Bethann Berkshire, [place] Phone Number: 09/04/2020, 1:15 PM   Clinical Narrative:     Patient will DC to: [facility] Anticipated DC date: 09/04/20 Family notified: Daughter [name] Transport by: Charisse Klinefelter   Per MD patient ready for DC to [facility] . RN, patient, patient's family, and facility notified of DC. Discharge Summary sent to facility. RN to call report prior to discharge [phone]). DC packet on chart. Ambulance transport requested for patient.   CSW will sign off for now as social work intervention is no longer needed. Please consult [place] again if new needs arise.   Final next level of care: [place] Barriers to Discharge: No Barriers Identified   Patient Goals and CMS Choice        Discharge Placement              Patient chooses bed at:  [facility]) Patient to be transferred to facility by: [place] Name of family member notified: Daughter [name] Patient and family notified of of transfer: 09/04/20  Discharge Plan and Services                                     Social Determinants of Health (SDOH) Interventions     Readmission Risk Interventions Readmission Risk Prevention Plan 06/27/2020  Transportation Screening Complete  PCP or Specialist Appt within 3-5 Days Complete  HRI or [place] Complete  Social Work Consult for [place] Planning/Counseling Complete  Palliative Care Screening Not Applicable  Medication Review Press photographer) Complete  Some recent data might be hidden

## 2020-09-04 NOTE — Consult Note (Signed)
[facility] Inpatient Consult   09/04/2020  Brittany Roberts July 11, 1940 [id number]   [facility] [[facility]]  Accountable Care Organization [ACO] Patient: [name]    Patient screened for hospitalization with noted extreme high risk score for unplanned readmission risk. Review of patient's medical record reveals patient is under hospice care.  Plan:  Will sign off at transition from the hospital as needs for post hospital care is currently under Hospice. 4:45 pm Patient transitioned to [facility].  For questions contact:   Charlesetta Shanks, RN BSN CCM [facility]  [phone] business mobile phone Toll free office [phone]  Fax number: [phone] [email or web address]

## 2020-09-04 NOTE — Discharge Summary (Signed)
Discharge Summary  Brittany Roberts ZOX:[id number] DOB: 1940-10-10  PCP: [name], [name], MD  Admit date: 09/02/2020 Discharge date: 09/04/2020  Time spent:  63mins  Recommendations for Outpatient Follow-up:   Discharge to residential hospice on full comfort measures  Discharge Diagnoses:  Active Hospital Problems   Diagnosis Date Noted  . Acute GI bleeding 09/02/2020  . LV (left ventricular) mural thrombus 12/11/2019  . Chronic kidney disease (CKD), stage IV (severe) ([facility]) 09/01/2017  . Hx of systemic lupus erythematosus (SLE) ([facility])   . Type 2 diabetes mellitus with diabetic neuropathy, unspecified ([place]) 11/27/2013  . DM (diabetes mellitus), type 2 with neurological complications ([place]) 81/19/1478  . Hypothyroidism 03/07/2007    Resolved Hospital Problems  No resolved problems to display.    Discharge Condition: stable  Diet recommendation: Diet as tolerated  Filed Weights   09/02/20 1254 09/03/20 0242  Weight: 127.1 kg 117 kg    History of present illness: (Per admitting MD Dr. Hal Hope) PCP: [name], [name], MD  Patient coming from: Home.  History obtained from patient's daughter.  Patient is confused and lethargic.  Chief Complaint: Confusion.  HPI: Brittany Roberts is a 80 y.o. female with history of dementia, CHF, COPD, diabetes mellitus, LV thrombus, A. fib, chronic kidney disease stage III who was recently discontinued off her anticoagulation due to minimizing medication and under hospice care with history of lung mass which patient's daughter has not wanting to pursue aggressive measures was found to be increasingly confused over the last 2 weeks with poor Po intake.  Did not have any nausea vomiting or chest pain.  ED Course: In the ER patient appeared confused CT head showed nothing acute.  Chest x-ray shows possible aspiration.  Labs are significant for hemoglobin of 5.3 with INR of 6.2.  Stool for occult blood was positive.  Covid test was negative.  GI on-call  was consulted requested conservative management given the multiple comorbidities.  Patient is a Sales promotion account executive Witness and this was confirmed with patient's daughter.  Does not want any blood transfusion.  IV iron was ordered along with vitamin K 10 mg.  Hospital Course:  Principal Problem:   Acute GI bleeding Active Problems:   Hypothyroidism   DM (diabetes mellitus), type 2 with neurological complications (HCC)   Type 2 diabetes mellitus with diabetic neuropathy, unspecified (HCC)   Hx of systemic lupus erythematosus (SLE) (HCC)   Chronic kidney disease (CKD), stage IV (severe) (HCC)   LV (left ventricular) mural thrombus   Acute GI bleeding in the setting of supratherapeutic INR She received IV iron IV vitamin K on admission, no blood transfusion due to [place] Witness Family decided to transition care to full comfort measures  Possible aspiration pneumonia, daughter report patient has trouble swallowing at home as well Goals of care now transition to full comfort measures, will liberate diet knowing patient will have risk of aspiration, antibiotic discontinued  Acute metabolic encephalopathy/in the background of advanced dementia Start full comfort measures  History of lung mass no further work-up per family  History of A. Fib, LV thrombus Of anticoagulation, transition care to full comfort measures  Class III obesity  Body mass index is 45.69 kg/m..  FTT, family desires full comfort measures transition to residential hospice for symptom management.  Procedures:  none  Consultations:  hospice  Discharge Exam: BP 126/77 (BP Location: Right Wrist)   Pulse (!) 131   Temp 99.6 F (37.6 C) (Oral)   Resp 16   Ht 5'  3" (1.6 m)   Wt 117 kg   SpO2 100%   BMI 45.69 kg/m   General: calm and pleasant Cardiovascular: IRRR Respiratory: Normal respiratory effort  Discharge Instructions You were cared for by a hospitalist during your hospital stay. If you have any  questions about your discharge medications or the care you received while you were in the hospital after you are discharged, you can call the unit and asked to speak with the hospitalist on call if the hospitalist that took care of you is not available. Once you are discharged, your primary care physician will handle any further medical issues. Please note that NO REFILLS for any discharge medications will be authorized once you are discharged, as it is imperative that you return to your primary care physician (or establish a relationship with a primary care physician if you do not have one) for your aftercare needs so that they can reassess your need for medications and monitor your lab values.  Discharge Instructions    Diet general   Complete by: As directed    Increase activity slowly   Complete by: As directed      Allergies as of 09/04/2020      Reactions   Ace Inhibitors Other (See Comments)   Coincided with sig bump in creat. Retried and creat bumped again.   Penicillins Anaphylaxis, Swelling, Rash   Has patient had a PCN reaction causing immediate rash, facial/tongue/throat swelling, SOB or lightheadedness with hypotension: Yes Has patient had a PCN reaction causing severe rash involving mucus membranes or skin necrosis: Yes Has patient had a PCN reaction that required hospitalization: Yes Has patient had a PCN reaction occurring within the last 10 years: No If all of the above answers are "NO", then may proceed with Cephalosporin use.   Ropinirole Hydrochloride Anaphylaxis, Swelling   Buspirone Other (See Comments)   Pain   Isosorb Dinitrate-hydralazine Other (See Comments)   Made the patient "sleep all of the time"   Pregabalin Swelling   Sulfa Antibiotics Other (See Comments)   "Worked against the patient"   Amantadines Swelling, Rash, Other (See Comments)   "Swelling of the tongue"      Medication List    STOP taking these medications   Accu-Chek Guide test strip Generic  drug: glucose blood   Accu-Chek Softclix Lancets lancets   acetaminophen 500 MG tablet Commonly known as: TYLENOL   albuterol (2.5 MG/3ML) 0.083% nebulizer solution Commonly known as: PROVENTIL   allopurinol 100 MG tablet Commonly known as: ZYLOPRIM   amLODipine 2.5 MG tablet Commonly known as: NORVASC   atorvastatin 20 MG tablet Commonly known as: LIPITOR   DULoxetine 60 MG capsule Commonly known as: Cymbalta   feeding supplement Liqd   Fish Oil 1000 MG Caps   Fluocinolone Acetonide Scalp 0.01 % Oil   Fluticasone-Salmeterol 250-50 MCG/DOSE Aepb Commonly known as: ADVAIR   gabapentin 100 MG capsule Commonly known as: NEURONTIN   HumuLIN N KwikPen 100 UNIT/ML Kiwkpen Generic drug: Insulin NPH (Human) ([place])   insulin aspart 100 UNIT/ML injection Commonly known as: novoLOG   insulin glargine 100 unit/mL Sopn Commonly known as: LANTUS   Insulin Pen Needle 31G X 5 MM Misc   isosorbide mononitrate 30 MG 24 hr tablet Commonly known as: IMDUR   Lantus SoloStar 100 UNIT/ML Solostar Pen Generic drug: insulin glargine   LORazepam 0.5 MG tablet Commonly known as: ATIVAN   memantine 10 MG tablet Commonly known as: NAMENDA   metoprolol succinate 50 MG  24 hr tablet Commonly known as: TOPROL-XL   montelukast 10 MG tablet Commonly known as: SINGULAIR   pantoprazole 40 MG tablet Commonly known as: PROTONIX   ProAir HFA 108 (90 Base) MCG/ACT inhaler Generic drug: albuterol   Systane 0.4-0.3 % Gel ophthalmic gel Generic drug: Polyethyl Glycol-Propyl Glycol   torsemide 20 MG tablet Commonly known as: DEMADEX   Vitamin B Complex Tabs   Vitamin C 500 MG Caps   Vitamin D3 125 MCG (5000 UT) Tabs      Allergies  Allergen Reactions  . Ace Inhibitors Other (See Comments)    Coincided with sig bump in creat. Retried and creat bumped again.   Marland Kitchen Penicillins Anaphylaxis, Swelling and Rash    Has patient had a PCN reaction causing immediate rash,  facial/tongue/throat swelling, SOB or lightheadedness with hypotension: Yes Has patient had a PCN reaction causing severe rash involving mucus membranes or skin necrosis: Yes Has patient had a PCN reaction that required hospitalization: Yes Has patient had a PCN reaction occurring within the last 10 years: No If all of the above answers are "NO", then may proceed with Cephalosporin use.   . Ropinirole Hydrochloride Anaphylaxis and Swelling  . Buspirone Other (See Comments)    Pain    . Isosorb Dinitrate-Hydralazine Other (See Comments)    Made the patient "sleep all of the time"  . Pregabalin Swelling  . Sulfa Antibiotics Other (See Comments)    "Worked against the patient"  . Amantadines Swelling, Rash and Other (See Comments)    "Swelling of the tongue"      The results of significant diagnostics from this hospitalization (including imaging, microbiology, ancillary and laboratory) are listed below for reference.    Significant Diagnostic Studies: CT HEAD WO CONTRAST  Result Date: 09/02/2020 CLINICAL DATA:  Mental status changes. EXAM: CT HEAD WITHOUT CONTRAST TECHNIQUE: Contiguous axial images were obtained from the base of the skull through the vertex without intravenous contrast. COMPARISON:  CT head of 06/22/2020 and MRI brain from 06/23/2020 FINDINGS: Brain: Chronic right frontal MCA distribution infarct noted, unchanged distribution of encephalomalacia from 06/22/2020. Otherwise, the brainstem, cerebellum, cerebral peduncles, thalamus, basal ganglia, basilar cisterns, and ventricular system appear within normal limits. No intracranial hemorrhage, mass lesion, or acute CVA. Vascular: There is atherosclerotic calcification of the cavernous carotid arteries bilaterally. Skull: No fracture or acute bony findings. Borderline thickened calvarium. Sinuses/Orbits: Chronic deformity of the right maxillary sinus with adjacent bony expansion and postoperative findings. Chronic sclerosis in the  left mandibular condyle with associated superior surface flattening or erosion. Other: No supplemental non-categorized findings. IMPRESSION: 1. No acute intracranial findings. 2. Chronic right frontal MCA distribution infarct, unchanged distribution of encephalomalacia from 06/22/2020. 3. Chronic deformity of the right maxillary sinus with adjacent bony expansion and likely underlying postoperative findings. 4. Chronic sclerosis in the left mandibular condyle with associated superior surface flattening or erosion. Electronically Signed   By: Van Clines M.D.   On: 09/02/2020 15:15   DG Chest Port 1 View  Result Date: 09/02/2020 CLINICAL DATA:  Rule out infection. EXAM: PORTABLE CHEST 1 VIEW COMPARISON:  CT chest June 23, 2020. Chest radiographs July 28, 2020. FINDINGS: Right hilar/suprahilar masslike prominence, better characterized on recent CT chest from 06/23/2020. Enlarged cardiac silhouette. Aortic atherosclerosis. Poorly characterized left basilar opacities. Suspected small left pleural effusion. No visible pneumothorax. IMPRESSION: 1. Left basilar opacities which are poorly characterized on this portable radiograph, possibly atelectasis, aspiration, and/or pneumonia. Dedicated PA and lateral radiographs could better characterized if  clinically indicated. 2. Cardiomegaly and pulmonary vascular congestion with suspected small left pleural effusion. 3. Right hilar/suprahilar masslike prominence, better characterized on recent CT chest from 06/23/2020. Electronically Signed   By: Margaretha Sheffield MD   On: 09/02/2020 14:18    Microbiology: Recent Results (from the past 240 hour(s))  Urine culture     Status: None (Preliminary result)   Collection Time: 09/02/20  5:37 PM   Specimen: Urine, Random  Result Value Ref Range Status   Specimen Description URINE, RANDOM  Final   Special Requests NONE  Final   Culture   Final    CULTURE REINCUBATED FOR BETTER GROWTH Performed at [facility], [address], [place], [place] [address]    Report Status PENDING  Incomplete  SARS CORONAVIRUS 2 (TAT 6-24 HRS) Nasopharyngeal Nasopharyngeal Swab     Status: None   Collection Time: 09/02/20  6:26 PM   Specimen: Nasopharyngeal Swab  Result Value Ref Range Status   SARS Coronavirus 2 NEGATIVE NEGATIVE Final    Comment: (NOTE) SARS-CoV-2 target nucleic acids are NOT DETECTED.  The SARS-CoV-2 RNA is generally detectable in upper and lower respiratory specimens during the acute phase of infection. Negative results do not preclude SARS-CoV-2 infection, do not rule out co-infections with other pathogens, and should not be used as the sole basis for treatment or other patient management decisions. Negative results must be combined with clinical observations, patient history, and epidemiological information. The expected result is Negative.  Fact Sheet for Patients: SugarRoll.be  Fact Sheet for Healthcare Providers: https://www.woods-mathews.com/  This test is not yet approved or cleared by the [place] FDA and  has been authorized for detection and/or diagnosis of SARS-CoV-2 by FDA under an Emergency Use Authorization (EUA). This EUA will remain  in effect (meaning this test can be used) for the duration of the COVID-19 declaration under Se ction 564(b)(1) of the Act, 21 U.S.C. section 360bbb-3(b)(1), unless the authorization is terminated or revoked sooner.  Performed at [facility], [place] [address], [place], [place] [address]      Labs: Basic Metabolic Panel: Recent Labs  Lab 09/02/20 1758  NA 140  K 3.7  CL 95*  CO2 36*  GLUCOSE 239*  BUN 54*  CREATININE 2.04*  CALCIUM 10.3   Liver Function Tests: Recent Labs  Lab 09/02/20 1758  AST 35  ALT 21  ALKPHOS 97  BILITOT 0.7  PROT 7.1  ALBUMIN 2.9*   Recent Labs  Lab 09/02/20 1758  LIPASE 35   Recent Labs  Lab 09/02/20 1737  AMMONIA 24    CBC: Recent Labs  Lab 09/02/20 1758  WBC 14.6*  NEUTROABS 11.0*  HGB 5.3*  HCT 16.0*  MCV 79.2*  PLT 238   Cardiac Enzymes: No results for input(s): CKTOTAL, CKMB, CKMBINDEX, TROPONINI in the last 168 hours. BNP: BNP (last 3 results) Recent Labs    06/23/20 0018 07/28/20 0302 09/02/20 1542  BNP 1,752.8* 1,629.5* 541.9*    ProBNP (last 3 results) No results for input(s): PROBNP in the last 8760 hours.  CBG: Recent Labs  Lab 09/02/20 1403 09/02/20 2239 09/03/20 0117 09/03/20 0401 09/03/20 0819  GLUCAP 264* 241* 212* 214* 172*       Signed:  Florencia Reasons MD, PhD, FACP  [facility] 09/04/2020, 1:00 PM

## 2020-09-04 NOTE — Progress Notes (Signed)
Manufacturing engineer [facility]) Hospital Liaison note.     Registration paperwork has been completed; Please arrange transport. TOC aware.  RN please call report to [phone].  Please be sure the signed [facility] DNR form transports with the patient.  Thank you for the opportunity to participate in this patient's care.  Domenic Moras, BSN, RN [facility] Liaison (listed on [place] under Hospice[facility])    [phone] (24h on call)

## 2020-09-05 LAB — URINE CULTURE: Culture: 80000 — AB

## 2020-09-28 ENCOUNTER — Other Ambulatory Visit: Payer: Self-pay | Admitting: Orthopedic Surgery

## 2020-09-28 DIAGNOSIS — F0391 Unspecified dementia with behavioral disturbance: Secondary | ICD-10-CM

## 2020-10-03 DEATH — deceased

## 2020-10-06 ENCOUNTER — Ambulatory Visit: Payer: Medicare Other | Admitting: Cardiology

## 2021-05-26 ENCOUNTER — Other Ambulatory Visit: Payer: Self-pay | Admitting: Family Medicine

## 2021-05-26 DIAGNOSIS — E1140 Type 2 diabetes mellitus with diabetic neuropathy, unspecified: Secondary | ICD-10-CM

## 2021-05-26 DIAGNOSIS — J309 Allergic rhinitis, unspecified: Secondary | ICD-10-CM

## 2021-05-26 DIAGNOSIS — Z794 Long term (current) use of insulin: Secondary | ICD-10-CM
